# Patient Record
Sex: Male | Born: 1965 | Race: Black or African American | Hispanic: No | Marital: Single | Smoking: Never smoker
Health system: Southern US, Community
[De-identification: ages and names within clinical notes are randomized; demographics above are authoritative.]

## PROBLEM LIST (undated history)

## (undated) DIAGNOSIS — N189 Chronic kidney disease, unspecified: Secondary | ICD-10-CM

## (undated) DIAGNOSIS — N186 End stage renal disease: Secondary | ICD-10-CM

## (undated) DIAGNOSIS — G629 Polyneuropathy, unspecified: Secondary | ICD-10-CM

## (undated) DIAGNOSIS — K6289 Other specified diseases of anus and rectum: Secondary | ICD-10-CM

## (undated) DIAGNOSIS — N2581 Secondary hyperparathyroidism of renal origin: Secondary | ICD-10-CM

## (undated) DIAGNOSIS — K649 Unspecified hemorrhoids: Secondary | ICD-10-CM

## (undated) DIAGNOSIS — I5042 Chronic combined systolic (congestive) and diastolic (congestive) heart failure: Secondary | ICD-10-CM

## (undated) DIAGNOSIS — R768 Other specified abnormal immunological findings in serum: Secondary | ICD-10-CM

## (undated) DIAGNOSIS — N261 Atrophy of kidney (terminal): Secondary | ICD-10-CM

## (undated) DIAGNOSIS — D631 Anemia in chronic kidney disease: Secondary | ICD-10-CM

## (undated) DIAGNOSIS — Z992 Dependence on renal dialysis: Secondary | ICD-10-CM

## (undated) DIAGNOSIS — I444 Left anterior fascicular block: Secondary | ICD-10-CM

## (undated) DIAGNOSIS — Z8709 Personal history of other diseases of the respiratory system: Secondary | ICD-10-CM

## (undated) DIAGNOSIS — I10 Essential (primary) hypertension: Secondary | ICD-10-CM

## (undated) DIAGNOSIS — I255 Ischemic cardiomyopathy: Secondary | ICD-10-CM

## (undated) DIAGNOSIS — E1122 Type 2 diabetes mellitus with diabetic chronic kidney disease: Secondary | ICD-10-CM

## (undated) HISTORY — PX: TRANSTHORACIC ECHOCARDIOGRAM: SHX275

## (undated) HISTORY — PX: DOBUTAMINE STRESS ECHO: SHX5426

## (undated) HISTORY — DX: Essential (primary) hypertension: I10

## (undated) HISTORY — PX: CARDIOVASCULAR STRESS TEST: SHX262

---

## 2002-10-04 ENCOUNTER — Encounter: Payer: Self-pay | Admitting: Emergency Medicine

## 2002-10-04 ENCOUNTER — Emergency Department (HOSPITAL_COMMUNITY): Admission: EM | Admit: 2002-10-04 | Discharge: 2002-10-04 | Payer: Self-pay | Admitting: Emergency Medicine

## 2003-09-09 ENCOUNTER — Emergency Department (HOSPITAL_COMMUNITY): Admission: EM | Admit: 2003-09-09 | Discharge: 2003-09-09 | Payer: Self-pay | Admitting: Emergency Medicine

## 2004-09-12 ENCOUNTER — Inpatient Hospital Stay (HOSPITAL_COMMUNITY): Admission: EM | Admit: 2004-09-12 | Discharge: 2004-09-20 | Payer: Self-pay | Admitting: Emergency Medicine

## 2004-09-12 ENCOUNTER — Encounter (INDEPENDENT_AMBULATORY_CARE_PROVIDER_SITE_OTHER): Payer: Self-pay | Admitting: Specialist

## 2004-09-12 HISTORY — PX: APPENDECTOMY: SHX54

## 2005-07-22 ENCOUNTER — Encounter (INDEPENDENT_AMBULATORY_CARE_PROVIDER_SITE_OTHER): Payer: Self-pay | Admitting: *Deleted

## 2005-07-22 ENCOUNTER — Ambulatory Visit (HOSPITAL_COMMUNITY): Admission: RE | Admit: 2005-07-22 | Discharge: 2005-07-22 | Payer: Self-pay | Admitting: Pulmonary Disease

## 2005-09-25 ENCOUNTER — Emergency Department (HOSPITAL_COMMUNITY): Admission: EM | Admit: 2005-09-25 | Discharge: 2005-09-26 | Payer: Self-pay | Admitting: Emergency Medicine

## 2005-10-07 ENCOUNTER — Ambulatory Visit (HOSPITAL_COMMUNITY): Admission: RE | Admit: 2005-10-07 | Discharge: 2005-10-07 | Payer: Self-pay | Admitting: Pulmonary Disease

## 2005-12-15 ENCOUNTER — Ambulatory Visit (HOSPITAL_COMMUNITY): Admission: RE | Admit: 2005-12-15 | Discharge: 2005-12-15 | Payer: Self-pay | Admitting: Pulmonary Disease

## 2005-12-15 ENCOUNTER — Encounter: Payer: Self-pay | Admitting: Vascular Surgery

## 2006-04-23 ENCOUNTER — Emergency Department (HOSPITAL_COMMUNITY): Admission: EM | Admit: 2006-04-23 | Discharge: 2006-04-24 | Payer: Self-pay | Admitting: Emergency Medicine

## 2006-08-10 ENCOUNTER — Encounter (INDEPENDENT_AMBULATORY_CARE_PROVIDER_SITE_OTHER): Payer: Self-pay | Admitting: Pulmonary Disease

## 2006-08-10 ENCOUNTER — Ambulatory Visit: Payer: Self-pay | Admitting: Vascular Surgery

## 2006-08-10 ENCOUNTER — Ambulatory Visit (HOSPITAL_COMMUNITY): Admission: RE | Admit: 2006-08-10 | Discharge: 2006-08-10 | Payer: Self-pay | Admitting: Pulmonary Disease

## 2007-07-11 ENCOUNTER — Emergency Department (HOSPITAL_COMMUNITY): Admission: EM | Admit: 2007-07-11 | Discharge: 2007-07-11 | Payer: Self-pay | Admitting: Emergency Medicine

## 2007-07-16 ENCOUNTER — Emergency Department (HOSPITAL_COMMUNITY): Admission: EM | Admit: 2007-07-16 | Discharge: 2007-07-16 | Payer: Self-pay | Admitting: Emergency Medicine

## 2007-08-12 ENCOUNTER — Emergency Department (HOSPITAL_COMMUNITY): Admission: EM | Admit: 2007-08-12 | Discharge: 2007-08-12 | Payer: Self-pay | Admitting: Emergency Medicine

## 2009-03-22 HISTORY — PX: RETINAL DETACHMENT SURGERY: SHX105

## 2009-03-27 ENCOUNTER — Inpatient Hospital Stay (HOSPITAL_COMMUNITY): Admission: EM | Admit: 2009-03-27 | Discharge: 2009-03-29 | Payer: Self-pay | Admitting: Emergency Medicine

## 2009-03-29 ENCOUNTER — Encounter (INDEPENDENT_AMBULATORY_CARE_PROVIDER_SITE_OTHER): Payer: Self-pay | Admitting: Internal Medicine

## 2009-06-04 ENCOUNTER — Emergency Department (HOSPITAL_COMMUNITY): Admission: EM | Admit: 2009-06-04 | Discharge: 2009-06-04 | Payer: Self-pay | Admitting: Emergency Medicine

## 2009-07-09 ENCOUNTER — Encounter: Payer: Self-pay | Admitting: Internal Medicine

## 2009-08-19 ENCOUNTER — Ambulatory Visit: Payer: Self-pay | Admitting: Internal Medicine

## 2009-08-19 DIAGNOSIS — I2510 Atherosclerotic heart disease of native coronary artery without angina pectoris: Secondary | ICD-10-CM | POA: Insufficient documentation

## 2009-08-19 DIAGNOSIS — I5022 Chronic systolic (congestive) heart failure: Secondary | ICD-10-CM | POA: Insufficient documentation

## 2009-08-19 DIAGNOSIS — F32A Depression, unspecified: Secondary | ICD-10-CM | POA: Insufficient documentation

## 2009-08-19 DIAGNOSIS — N186 End stage renal disease: Secondary | ICD-10-CM | POA: Insufficient documentation

## 2009-08-19 DIAGNOSIS — I10 Essential (primary) hypertension: Secondary | ICD-10-CM | POA: Insufficient documentation

## 2009-08-19 DIAGNOSIS — F329 Major depressive disorder, single episode, unspecified: Secondary | ICD-10-CM

## 2009-08-19 DIAGNOSIS — E7800 Pure hypercholesterolemia, unspecified: Secondary | ICD-10-CM | POA: Insufficient documentation

## 2009-08-19 DIAGNOSIS — Z992 Dependence on renal dialysis: Secondary | ICD-10-CM | POA: Insufficient documentation

## 2009-10-06 ENCOUNTER — Ambulatory Visit: Payer: Self-pay | Admitting: Vascular Surgery

## 2009-10-06 ENCOUNTER — Ambulatory Visit: Admission: RE | Admit: 2009-10-06 | Discharge: 2009-10-06 | Payer: Self-pay | Admitting: Emergency Medicine

## 2009-10-06 ENCOUNTER — Encounter (INDEPENDENT_AMBULATORY_CARE_PROVIDER_SITE_OTHER): Payer: Self-pay | Admitting: Emergency Medicine

## 2009-10-17 ENCOUNTER — Ambulatory Visit: Payer: Self-pay | Admitting: Vascular Surgery

## 2010-02-10 ENCOUNTER — Ambulatory Visit: Payer: Self-pay | Admitting: Vascular Surgery

## 2010-02-27 ENCOUNTER — Ambulatory Visit (HOSPITAL_COMMUNITY)
Admission: RE | Admit: 2010-02-27 | Discharge: 2010-02-27 | Payer: Self-pay | Source: Home / Self Care | Attending: Vascular Surgery | Admitting: Vascular Surgery

## 2010-02-27 HISTORY — PX: AV FISTULA PLACEMENT: SHX1204

## 2010-04-10 ENCOUNTER — Ambulatory Visit: Admission: RE | Admit: 2010-04-10 | Discharge: 2010-04-10 | Payer: Self-pay | Source: Home / Self Care

## 2010-04-10 NOTE — Assessment & Plan Note (Addendum)
OFFICE VISIT  Bachus, Estephan L DOB:  Dec 17, 1965                                       04/10/2010 [id number]  DATE OF SURGERY:  02/27/10.  Patient presents for routine followup status post right Cimino AV fistula placed on 02/27/2010 by Dr. Donnetta Hutching.  Patient is doing well and is without complaint.  He has not started dialysis yet.  He is followed by Dr. Jimmy Footman.  The incision is well healed.  There is a strong thrill in the fistula up to the mid forearm.  There are 2+ radial and ulnar pulses in the right wrist.  Motor and sensation are intact.  There is no evidence of steal.  Patient is doing well status post fistula placement.  He will follow up p.r.n.  He is aware that if he were to begin dialysis within the next several weeks that he would require a Diatek catheter placement until the fistula has had a complete 12 weeks to mature.  Leta [name], [place]  Conrad Salem, MD Electronically Signed  AY/MEDQ  D:  04/10/2010  T:  04/10/2010  Job:  [id number]

## 2010-04-23 NOTE — Assessment & Plan Note (Signed)
Summary: NEP/CARDIOMYOPATHY/JML   History of Present Illness: [name] [name] is referred today by Dr. Tamala Julian for consideration for prophylactic ICD implantation.  The patient has a h/o DCM diagnosed back in 2009.  He describes being diagnosed with pneumonia at that time and initially did not think that he had any CHF.  He had improvement in his LV function initially but then on repeat echo his LV function has dropped back to 30% and he is referred for additional evaluation.  The patient has been without syncope and his CHF is class 2.  He was recently started on BiDil and he thinks that his symptoms are even better.  He denies c/p or sob except with strenuous activities.  He has had trouble with peripheral edema.  No other complaints today.  He has known renal insufficiency with a baseline creatinine of 3.    Current Medications (verified): 1)  Amaryl 4 Mg Tabs (Glimepiride) 2)  Carvedilol 6.25 Mg Tabs (Carvedilol) .... Take One Tablet By Mouth Twice A Day 3)  Calcitriol 0.25 Mcg Caps (Calcitriol) .... Two Times A Day 4)  Lipitor 40 Mg Tabs (Atorvastatin Calcium) .... Take One Tablet By Mouth Daily. 5)  Aspirin 81 Mg Tbec (Aspirin) .... Take One Tablet By Mouth Daily 6)  Furosemide 80 Mg Tabs (Furosemide) .... Take 2 Tablets By Mouth Two Times A Day 7)  Bidil 20-37.5 Mg Tabs (Isosorb Dinitrate-Hydralazine) .... Take One Tablet By Mouth Twice Daily. 8)  Novolog 100 Unit/ml Soln (Insulin Aspart) .... Uad 9)  Multivitamins   Tabs (Multiple Vitamin) .... Once Daily 10)  Clonidine Hcl 0.1 Mg Tabs (Clonidine Hcl) .... Take One Tablet By Mouth Twice A Day  Allergies (verified): No Known Drug Allergies  Past History:  Past Medical History: Last updated: 08/19/2009 Current Problems:  DEPRESSION, HX OF (ICD-V11.8) HYPERCHOLESTEROLEMIA (ICD-272.0) HYPERTENSION (ICD-401.9) CHF (ICD-428.0) CAD (ICD-414.00) ATRIAL FLUTTER (ICD-427.32) ATRIAL FIBRILLATION (ICD-427.31)    Family History: No  information on parents. Brother has a stroke and HTN.  Social History: H/o tobacco none now Rare ETOH single  Review of Systems       All systems reviewed and negative except as noted in the HPI.  Vital Signs:  Patient profile:   45 year old male Height:      66 inches Weight:      280 pounds BMI:     45.36 Pulse rate:   91 / minute BP sitting:   142 / 88  (left arm)  Vitals Entered By: Margaretmary Bayley CMA (Aug 19, 2009 3:43 PM)  Physical Exam  General:  Obese, well developed, well nourished, in no acute distress.  HEENT: normal Neck: supple. No JVD. Carotids 2+ bilaterally no bruits Cor: RRR no rubs, gallops or murmur Lungs: CTA Ab: soft, nontender. nondistended. No HSM. Good bowel sounds Ext: warm. no cyanosis, clubbing or edema Neuro: alert and oriented. Grossly nonfocal. affect pleasant    EKG  Procedure date:  08/19/2009  Findings:      Normal sinus rhythm with rate of:  91.  Impression & Recommendations:  Problem # 1:  CHRONIC SYSTOLIC HEART FAILURE (123456) His symptoms are well controlled and class 2.  I have discussed the treatment options with the patient.  Though he has an indication for ICD, he is not inclined to pursue this at the present time.  He has been on BiDil for a couple of months and thinks that this may help with his symptoms and LV function.  He states that he would  like to repeat his echo in a few more months and would consider ICD if his LV function  remains depressed. His updated medication list for this problem includes:    Carvedilol 6.25 Mg Tabs (Carvedilol) .[name]... Take one tablet by mouth twice a day    Aspirin 81 Mg Tbec (Aspirin) .[name]... Take one tablet by mouth daily    Furosemide 80 Mg Tabs (Furosemide) .[name]... Take 2 tablets by mouth two times a day  Problem # 2:  HYPERTENSION (ICD-401.9) I have asked him to maintain a low sodium diet and continue his current meds. His updated medication list for this problem includes:     Carvedilol 6.25 Mg Tabs (Carvedilol) .[name]... Take one tablet by mouth twice a day    Aspirin 81 Mg Tbec (Aspirin) .[name]... Take one tablet by mouth daily    Furosemide 80 Mg Tabs (Furosemide) .[name]... Take 2 tablets by mouth two times a day    Clonidine Hcl 0.1 Mg Tabs (Clonidine hcl) .[name]... Take one tablet by mouth twice a day  Problem # 3:  RENAL INSUFFICIENCY, CHRONIC (ICD-585.9) His creatine is around 3.  The importance of taking his medications has been emphasized as is the maintenance of a low sodium diet.

## 2010-04-23 NOTE — Letter (Signed)
Summary: [name] Note  [facility] Office Note   Imported By: Sallee Provencal 08/25/2009 14:25:09  _____________________________________________________________________  External Attachment:    Type:   Image     Comment:   External Document

## 2010-06-02 LAB — POCT I-STAT 4, (NA,K, GLUC, HGB,HCT)
Glucose, Bld: 203 mg/dL — ABNORMAL HIGH (ref 70–99)
HCT: 34 % — ABNORMAL LOW (ref 39.0–52.0)
Hemoglobin: 11.6 g/dL — ABNORMAL LOW (ref 13.0–17.0)
Potassium: 3.2 mEq/L — ABNORMAL LOW (ref 3.5–5.1)
Sodium: 139 mEq/L (ref 135–145)

## 2010-06-02 LAB — SURGICAL PCR SCREEN
MRSA, PCR: NEGATIVE
Staphylococcus aureus: NEGATIVE

## 2010-06-02 LAB — GLUCOSE, CAPILLARY
Glucose-Capillary: 111 mg/dL — ABNORMAL HIGH (ref 70–99)
Glucose-Capillary: 126 mg/dL — ABNORMAL HIGH (ref 70–99)
Glucose-Capillary: 143 mg/dL — ABNORMAL HIGH (ref 70–99)
Glucose-Capillary: 146 mg/dL — ABNORMAL HIGH (ref 70–99)
Glucose-Capillary: 221 mg/dL — ABNORMAL HIGH (ref 70–99)

## 2010-06-07 LAB — BRAIN NATRIURETIC PEPTIDE: Pro B Natriuretic peptide (BNP): 236 pg/mL — ABNORMAL HIGH (ref 0.0–100.0)

## 2010-06-07 LAB — GLUCOSE, CAPILLARY
Glucose-Capillary: 119 mg/dL — ABNORMAL HIGH (ref 70–99)
Glucose-Capillary: 144 mg/dL — ABNORMAL HIGH (ref 70–99)
Glucose-Capillary: 163 mg/dL — ABNORMAL HIGH (ref 70–99)
Glucose-Capillary: 167 mg/dL — ABNORMAL HIGH (ref 70–99)
Glucose-Capillary: 168 mg/dL — ABNORMAL HIGH (ref 70–99)
Glucose-Capillary: 198 mg/dL — ABNORMAL HIGH (ref 70–99)
Glucose-Capillary: 202 mg/dL — ABNORMAL HIGH (ref 70–99)
Glucose-Capillary: 53 mg/dL — ABNORMAL LOW (ref 70–99)
Glucose-Capillary: 87 mg/dL (ref 70–99)
Glucose-Capillary: 89 mg/dL (ref 70–99)

## 2010-06-07 LAB — CBC
HCT: 33.1 % — ABNORMAL LOW (ref 39.0–52.0)
Hemoglobin: 11.1 g/dL — ABNORMAL LOW (ref 13.0–17.0)
MCHC: 33.6 g/dL (ref 30.0–36.0)
MCV: 95.1 fL (ref 78.0–100.0)
Platelets: 239 10*3/uL (ref 150–400)
RBC: 3.48 MIL/uL — ABNORMAL LOW (ref 4.22–5.81)
RDW: 14.3 % (ref 11.5–15.5)
WBC: 7.8 10*3/uL (ref 4.0–10.5)

## 2010-06-07 LAB — DIFFERENTIAL
Basophils Absolute: 0.1 10*3/uL (ref 0.0–0.1)
Basophils Relative: 1 % (ref 0–1)
Eosinophils Absolute: 0.2 10*3/uL (ref 0.0–0.7)
Eosinophils Relative: 3 % (ref 0–5)
Lymphocytes Relative: 23 % (ref 12–46)
Lymphs Abs: 1.8 10*3/uL (ref 0.7–4.0)
Monocytes Absolute: 0.4 10*3/uL (ref 0.1–1.0)
Monocytes Relative: 5 % (ref 3–12)
Neutro Abs: 5.3 10*3/uL (ref 1.7–7.7)
Neutrophils Relative %: 68 % (ref 43–77)

## 2010-06-07 LAB — BASIC METABOLIC PANEL
BUN: 36 mg/dL — ABNORMAL HIGH (ref 6–23)
BUN: 38 mg/dL — ABNORMAL HIGH (ref 6–23)
BUN: 45 mg/dL — ABNORMAL HIGH (ref 6–23)
CO2: 22 mEq/L (ref 19–32)
CO2: 22 mEq/L (ref 19–32)
CO2: 23 mEq/L (ref 19–32)
Calcium: 8.8 mg/dL (ref 8.4–10.5)
Calcium: 8.9 mg/dL (ref 8.4–10.5)
Calcium: 9.2 mg/dL (ref 8.4–10.5)
Chloride: 105 mEq/L (ref 96–112)
Chloride: 108 mEq/L (ref 96–112)
Chloride: 113 mEq/L — ABNORMAL HIGH (ref 96–112)
Creatinine, Ser: 3.2 mg/dL — ABNORMAL HIGH (ref 0.4–1.5)
Creatinine, Ser: 3.36 mg/dL — ABNORMAL HIGH (ref 0.4–1.5)
Creatinine, Ser: 3.6 mg/dL — ABNORMAL HIGH (ref 0.4–1.5)
GFR calc Af Amer: 23 mL/min — ABNORMAL LOW (ref >60)
GFR calc Af Amer: 24 mL/min — ABNORMAL LOW (ref >60)
GFR calc Af Amer: 26 mL/min — ABNORMAL LOW (ref >60)
GFR calc non Af Amer: 19 mL/min — ABNORMAL LOW (ref >60)
GFR calc non Af Amer: 20 mL/min — ABNORMAL LOW (ref >60)
GFR calc non Af Amer: 21 mL/min — ABNORMAL LOW (ref >60)
Glucose, Bld: 155 mg/dL — ABNORMAL HIGH (ref 70–99)
Glucose, Bld: 160 mg/dL — ABNORMAL HIGH (ref 70–99)
Glucose, Bld: 180 mg/dL — ABNORMAL HIGH (ref 70–99)
Potassium: 4.1 mEq/L (ref 3.5–5.1)
Potassium: 4.3 mEq/L (ref 3.5–5.1)
Potassium: 5 mEq/L (ref 3.5–5.1)
Sodium: 137 mEq/L (ref 135–145)
Sodium: 138 mEq/L (ref 135–145)
Sodium: 141 mEq/L (ref 135–145)

## 2010-06-07 LAB — PHOSPHORUS: Phosphorus: 4 mg/dL (ref 2.3–4.6)

## 2010-06-07 LAB — CARDIAC PANEL(CRET KIN+CKTOT+MB+TROPI)
CK, MB: 4.4 ng/mL — ABNORMAL HIGH (ref 0.3–4.0)
Relative Index: 2 (ref 0.0–2.5)
Total CK: 217 U/L (ref 7–232)
Troponin I: 0.03 ng/mL (ref 0.00–0.06)

## 2010-06-07 LAB — PROTIME-INR
INR: 0.96 (ref 0.00–1.49)
Prothrombin Time: 12.7 seconds (ref 11.6–15.2)

## 2010-06-07 LAB — POCT CARDIAC MARKERS
CKMB, poc: 4.7 ng/mL (ref 1.0–8.0)
Myoglobin, poc: 384 ng/mL (ref 12–200)
Troponin i, poc: <0.05 ng/mL (ref 0.00–0.09)

## 2010-06-07 LAB — CALCIUM: Calcium: 8.8 mg/dL (ref 8.4–10.5)

## 2010-06-07 LAB — HEMOGLOBIN A1C
Hgb A1c MFr Bld: 9.5 % — ABNORMAL HIGH (ref 4.6–6.1)
Mean Plasma Glucose: 226 mg/dL

## 2010-06-07 LAB — MAGNESIUM: Magnesium: 2.1 mg/dL (ref 1.5–2.5)

## 2010-06-07 LAB — PTH, INTACT AND CALCIUM
Calcium, Total (PTH): 8.3 mg/dL — ABNORMAL LOW (ref 8.4–10.5)
PTH: 146.3 pg/mL — ABNORMAL HIGH (ref 14.0–72.0)

## 2010-06-07 LAB — SEDIMENTATION RATE: Sed Rate: 95 mm/hr — ABNORMAL HIGH (ref 0–16)

## 2010-06-14 LAB — CBC
HCT: 38.2 % — ABNORMAL LOW (ref 39.0–52.0)
Hemoglobin: 12.6 g/dL — ABNORMAL LOW (ref 13.0–17.0)
MCHC: 33.2 g/dL (ref 30.0–36.0)
MCV: 97.4 fL (ref 78.0–100.0)
Platelets: 241 10*3/uL (ref 150–400)
RBC: 3.92 MIL/uL — ABNORMAL LOW (ref 4.22–5.81)
RDW: 15.6 % — ABNORMAL HIGH (ref 11.5–15.5)
WBC: 10.4 10*3/uL (ref 4.0–10.5)

## 2010-06-14 LAB — BASIC METABOLIC PANEL
BUN: 35 mg/dL — ABNORMAL HIGH (ref 6–23)
CO2: 28 mEq/L (ref 19–32)
Calcium: 9.1 mg/dL (ref 8.4–10.5)
Chloride: 108 mEq/L (ref 96–112)
Creatinine, Ser: 4.06 mg/dL — ABNORMAL HIGH (ref 0.4–1.5)
GFR calc Af Amer: 20 mL/min — ABNORMAL LOW (ref >60)
GFR calc non Af Amer: 16 mL/min — ABNORMAL LOW (ref >60)
Glucose, Bld: 151 mg/dL — ABNORMAL HIGH (ref 70–99)
Potassium: 4 mEq/L (ref 3.5–5.1)
Sodium: 141 mEq/L (ref 135–145)

## 2010-06-14 LAB — DIFFERENTIAL
Basophils Absolute: 0.1 10*3/uL (ref 0.0–0.1)
Basophils Relative: 1 % (ref 0–1)
Eosinophils Absolute: 0.3 10*3/uL (ref 0.0–0.7)
Eosinophils Relative: 3 % (ref 0–5)
Lymphocytes Relative: 20 % (ref 12–46)
Lymphs Abs: 2.1 10*3/uL (ref 0.7–4.0)
Monocytes Absolute: 0.7 10*3/uL (ref 0.1–1.0)
Monocytes Relative: 7 % (ref 3–12)
Neutro Abs: 7.2 10*3/uL (ref 1.7–7.7)
Neutrophils Relative %: 70 % (ref 43–77)

## 2010-08-04 NOTE — Assessment & Plan Note (Signed)
OFFICE VISIT   [name], Gay L  DOB:  1966/03/12                                       02/10/2010  [id number]   [name] [name] presents today for continued discussion regarding AV  access.  I saw him initially in July 2011.  He has had a slow  progression of his renal insufficiency and is here today for continued  discussion regarding AV access.  I did review his prior venous mapping  and this showed inadequate cephalic vein on the left and a good basilic  vein on the left.  He does have a patent cephalic vein throughout its  course on the right.  He is right-handed.  I again discussed options  with [name] [name].  On physical exam, he does have 2+ radial pulses  bilaterally and does have a nice cephalic vein at the wrist on the  right.  I had discussed basilic vein transposition on the left before  but would recommend a right Cimino AV fistula since he does have an  acceptable vein and a nice caliber on physical exam.  He understands and  wishes to proceed on February 27, 2010 around his work schedule.  He  understands this is an outpatient procedure, understands that he may  have poor maturation or occlusion of his fistula and in all likelihood  will require ongoing access maintenance in the future.     Rosetta Posner, M.D.  Electronically Signed   TFE/MEDQ  D:  02/10/2010  T:  02/11/2010  Job:  [id number]

## 2010-08-04 NOTE — Procedures (Signed)
CEPHALIC VEIN MAPPING   INDICATION:  Evaluation for AV fistula.   HISTORY:  Stage 4 renal disease.   EXAM:   The right cephalic vein is compressible.  The right basilic vein is  compressible.   Diameter measurements range from 0.48 to A999333 in the cephalic vein and  [id number] to [id number] in the basilic vein   The left cephalic vein has intraluminal thickening and thrombus noted in  the cephalic vein in the mid lower arm.  The left basilic vein is  compressible.   Diameter measurements range from 0.37 to 123XX123 in the cephalic vein and  123XX123 to 123456 in the basilic vein.   See attached worksheet for all measurements.   IMPRESSION:  1. Patent right cephalic vein and right and left basilic veins with      diameters as noted above.  2. Left cephalic vein has evidence of intraluminal thickening and      thrombus noted in the cephalic vein in the mid lower arm.   ___________________________________________  Rosetta Posner, M.D.   NT/MEDQ  D:  10/17/2009  T:  10/17/2009  Job:  [id number]

## 2010-08-04 NOTE — Consult Note (Signed)
NEW PATIENT CONSULTATION   [name], [name] L  DOB:  1965/09/24                                       10/17/2009  [id number]   The patient presents today for discussion of AV access.  He is a 45-year-  old gentleman with progressive renal insufficiency.  I have his office  notes as provided by Dr. Jimmy Footman.  His creatinine is in the mid 5  range and it is felt that he will need hemodialysis access relatively  soon.  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  His only  prior surgery was appendectomy.  He is single.  He has no children.  He  does not smoke or drink alcohol.   REVIEW OF SYSTEMS:  Positive for weight gain up to 278 pounds.  He is 5  feet 6 inches tall.  He does have shortness of breath with lying flat.   PHYSICAL EXAMINATION:  A well-developed, well-nourished obese black male  in no acute distress.  Blood pressure is 106/68, pulse 76, respirations  18.  He has normal radial pulses bilaterally.  He has very small surface  veins bilaterally.   He underwent vein mapping in our office which showed inadequate cephalic  vein in the left arm.  He does have a moderate sized cephalic vein in  the right arm.  He has a very nice caliber basilic vein in the left arm.   I had a long discussion with the patient explaining the options for  hemodialysis to include a catheter for acute hemodialysis, AV fistula  and AV grafts.  He clearly is not a candidate for a left arm AV fistula  but would be a candidate for a basilic vein transposition.  I explained  the magnitude of this procedure and also the chance that it would not  mature adequately for long-term hemodialysis.  He understands this.  He  is not willing to proceed with scheduling of this and wants to discuss  this later more fully with Dr. Jimmy Footman before he is willing to commit  to access placement.  We are available at his  convenience for surgery.     Rosetta Posner, M.D.  Electronically Signed   TFE/MEDQ  D:  10/17/2009  T:  10/17/2009  Job:  [id number]   cc:   Jeneen Rinks L. [place], M.D.

## 2010-08-07 NOTE — H&P (Signed)
[name] [name], [name] [name]                ACCOUNT NO.:  [id number]   MEDICAL RECORD NO.:  [id number]          PATIENT TYPE:  OBV   LOCATION:  0104                         FACILITY[facility]  [facility]   PHYSICIAN:  Orson Ape. Weatherly, M.D.DATE OF BIRTH:  Mar 05, 1966   DATE OF ADMISSION:  09/12/2004  DATE OF DISCHARGE:                                HISTORY & PHYSICAL   CHIEF COMPLAINT:  Abdominal pain, 2-3 days, severe today.   HISTORY:  [name] [name] is a 45 year old male, diabetic, about 5 feet 5  inches, 240 pounds, who is followed by Dr. [name] for diabetes  and he is on three oral medications for good control.  He stated about 2-3  days ago he started having kind of abdominal discomfort, it kind of waxed  and waned.  He thought it was the flu.  He thought he was getting better,  then today he became very acutely ill with marked tenderness in the lower  right abdomen and presented to the emergency room at approximately 11:30  a.m.  His sugar was measured and it was about 400.  His white count was  15,200 with a hemoglobin of 14.8.  Electrolytes were okay, BUN was 6,  creatinine 1.2.  The ER physician sent him over for a CAT scan after oral  contrast and this showed a ruptured retrocecal appendicitis.  The patient  was started on Unasyn and I think the Unasyn was not started until after the  CT was performed.  They had given him some glucose; however, his sugar was  still about 400.  An IV had been started.  His temperature at this time was  102.9, and his pulse about 140.  He had received about 2 liters of IV fluids  and 3 grams of Unasyn.  We started him on a Glucomander.  I saw him and he  was definitely tender in the right lower quadrant, more so than other  quadrants of the abdomen, and he definitely needs a urgent laparotomy for  removal of his ruptured retrocecal appendix with a generalized peritonitis.  The patient, I do not think, has had any previous abdominal surgery.  He  works  at [employer].  I am sure exactly what the three medications  Glucophage and two other medications.  I do not know exactly but he was not  on insulin. His mother says yes his sugar was well managed prior to the  onset of this problem.  The patient has no history of high blood pressure or  angina.   No allergies.   PHYSICAL EXAMINATION:  VITAL SIGNS:  When I saw him and this was about 5:30  or so, his pulse was 140, blood pressure 120/71, his temperature was about  102, and respirations were 20.  EYES/EARS/NOSE/THROAT:  He appears adequately hydrated.  He is oriented.  LUNGS:  Good breath sounds bilaterally.  HEART:  Sinus tachycardia.  ABDOMEN:  He is definitely tender with rebound in the right lower quadrant.  He is mildly tender in the other quadrants but much more in the right lower  quadrant.  RECTAL:  I did not do a rectal exam, since the CT had been performed and  obviously shows appendicitis.  EXTREMITIES:  He has got good peripheral pulses.  Warm to touch.   ADMISSION IMPRESSION:  1.  Ruptured retrocecal appendicitis.  2.  History of diabetes, now significantly out of control from the      infection.   PLAN:  1.  The patient has been started on a Glucomander.  2.  We have given him Unasyn.  3.  We will take him over to the operating room with __________  and fluid      boluses and he will have a Foley catheter, etcetera.       WJW/MEDQ  D:  09/12/2004  T:  09/12/2004  Job:  [id number]   cc:   Orson Ape. Rise Patience, M.D.  [address], [address]  [place]  [place] [address]   Leola Brazil., M.D.  [place]. [place]  [name] [address]  Fax: [phone]   patient's chart

## 2010-08-07 NOTE — Op Note (Signed)
NAMELYNDLE, VIRGEN                ACCOUNT NO.:  [id number]   MEDICAL RECORD NO.:  [id number]          PATIENT TYPE:  OBV   LOCATION:  0104                         FACILITY:  [facility]   PHYSICIAN:  Orson Ape. Weatherly, M.D.DATE OF BIRTH:  08/28/1965   DATE OF PROCEDURE:  09/12/2004  DATE OF DISCHARGE:                                 OPERATIVE REPORT   PREOPERATIVE DIAGNOSES:  1.  Ruptured retrocecal appendicitis with peritonitis.  2.  Diabetes with glucose about 400.   POSTOPERATIVE DIAGNOSES:  1.  Ruptured retrocecal appendicitis with peritonitis.  2.  Diabetes with glucose about 400.   OPERATION:  Laparotomy and removal of a ruptured retrocecal appendicitis and  drainage.  Wound left open.   SURGEON:  Orson Ape. Rise Patience, M.D.   ASSISTANTS:  Two scrub techs.   ANESTHESIA:  General.   HISTORY:  Kohlson Steffensmeier is a 45 year old about 5 feet 4-or-5 inch male, about  240 pounds who is an oral diabetic; and has had about a 3-day history of  progressive abdominal pain that would kind of wax and wane.  Today it got  much worse and he presented to the emergency room at about 11:30.  His  glucose was 400.  He has been nauseous, but not really vomiting, and fluids  were administered.  A CT showed a retrocecal appendix and then he was given  3 grams of Indocin.  His pulse is elevated as was his temperature and I  recommended that we go ahead and start him on pretty high insulin and  proceed on with the a laparotomy.   He was taken to surgery.  Fortunately there were 2 scrub nurses, Eddie Dibbles who is  quite strong and available, and I made a right transverse, right lower  quadrant incision that included approximately 3-to-4 inches of adipose  tissue.  The anterior rectus fascia was divided transversely.  A portion of  the right rectus was divided and then kind of carefully opened into the  peritoneal cavity.  He had marked, foul smelling, fecal drainage that was  cultured aerobically and  anaerobically and you could feel this markedly  inflamed appendix which was kind of teased up.  The surrounding adipose  tissue was very friable.  Hemostasis was obtained with predominately ties of  2-0 Vicryl.  I did use some sutures of 2-0 Vicryl.   The appendix was gangrenous and had ruptured close to its base and I was  able to kind of mobilize the cecum.  You could not get this up to the  abdominal wall and I used a TA-30 with the regular staples to undergo this  inflamed appendix and then removed the appendix.  The suture line was then  inverted with interrupted 3-0 silk sutures and good hemostasis had been  obtained.  A 19-Blake drain was placed through the operative area down into  the pelvis after thoroughly irrigating everything.  There was really no  omentum that I could bring down over this and then the peritoneum and  transversalis area was all closed with a running #0 Vicryl and then the  anterior rectus fascia and some of the little more lateral areas of the  internal oblique were closed with interrupted sutures of #0 Prolene. The  subcutaneous tissue was packed with Betadine and saline soaked gauzes and  the does have an NG-tube.  He had a Foley catheter inserted preoperatively  and we will keep him on a Foley catheter.  The patient tolerated the  procedure  nicely.  His glucose was about 350 at the completion.  His pulse now is  about 120.  He is making adequate urine and we will get him on the  Glucometer and then also broader antibiotic coverage of Unasyn and a  __________ Flagyl.  There was just foul, fecal smelling drainage upon  opening the abdomen.       WJW/MEDQ  D:  09/12/2004  T:  09/13/2004  Job:  [id number]   cc:   Orson Ape. Rise Patience, M.D.  [address], [address]  [place]  [place] [address]   Leola Brazil., M.D.  [place] [place]  [place] [address]  Fax: [phone]

## 2010-08-07 NOTE — Discharge Summary (Signed)
NAMEAHKING, PRETZER                ACCOUNT NO.:  [id number]   MEDICAL RECORD NO.:  [id number]          PATIENT TYPE:  INP   LOCATION:  [place]                         FACILITY:  [facility]   PHYSICIAN:  Orson Ape. Weatherly, M.D.DATE OF BIRTH:  1966/01/13   DATE OF ADMISSION:  09/12/2004  DATE OF DISCHARGE:  09/20/2004                                 DISCHARGE SUMMARY   DISCHARGE DIAGNOSES:  1.  Ruptured retrocecal appendicitis.  2.  Diabetes mellitus.   CONSULTATIONS:  Dr. Katherine Roan.   HISTORY:  [name] [name] is a 45 year old male diabetic, 240 pounds, who is  followed by Dr. [name] for his diabetes and is on three oral  medications. He states for approximately 3 days he was having abdominal  discomfort that kind of waxed and waned, thought he was having the flu, then  thought he was getting better, but then presented to the emergency room at  approximately 11:30 on September 11, 2004. He had a white count of 15,000; BUN  was normal; and a CT was performed which showed a ruptured retrocecal  appendicitis. The patient was started on Unasyn but I think his Unasyn was  not started until the CT was completed, and his glucose was greater than  400. He had a temperature of about 103 and I was called and he was  definitely tender in the right lower quadrant and appeared to have a  generalized peritonitis. He has never been on insulin and was given a fluid  bolus, Tylenol for his elevated temperature, and then taken to surgery. He  had a markedly inflamed, ruptured retrocecal appendix and it was performed  through a right lower quadrant transverse incision and postoperatively was  managed with a Glucometer, and I added Flagyl to the antibiotics. He  continued to have an elevated temperature and the cultures showed  Pseudomonas in addition to the other enteric organisms, and the Unasyn was  changed to Zosyn. Dr. Katherine Roan was asked to see the patient since it  appears to me that oral  medications are not adequate for his diabetic  management, and he was started on Lantus insulin 25 units every night and  also glucose checks, and also was continued with the Glucophage 500 mg  b.i.d. and Lasix and K-Dur. The patient's wound had been left open and he  was discharged on Cipro 750 mg b.i.d. for 5 days and also Flagyl 500 mg  q.i.d. for 5 days, and Vicodin for pain. He will be seen in the office for a  wound check in approximately 4 or 5 days. Hopefully the wound can be Steri-  Stripped at that time. The patient's diet was up to a regular diabetic diet  at the time of his release, and he hopefully will be able to return to work  in approximately 2 weeks. He is discharged in improved condition and most  likely will need to continue on insulin as the oral medications were  probably not adequately controlling his diabetes.       WJW/MEDQ  D:  10/19/2004  T:  10/19/2004  Job:  [id number]  cc:   Leola Brazil., M.D.  [place]. [address] [place]  [place] [address]  Fax: [phone]

## 2010-10-09 ENCOUNTER — Ambulatory Visit: Payer: Self-pay

## 2010-10-14 ENCOUNTER — Ambulatory Visit (INDEPENDENT_AMBULATORY_CARE_PROVIDER_SITE_OTHER): Payer: 59

## 2010-10-14 DIAGNOSIS — T82598A Other mechanical complication of other cardiac and vascular devices and implants, initial encounter: Secondary | ICD-10-CM

## 2010-10-14 DIAGNOSIS — T82898A Other specified complication of vascular prosthetic devices, implants and grafts, initial encounter: Secondary | ICD-10-CM

## 2010-10-14 DIAGNOSIS — N186 End stage renal disease: Secondary | ICD-10-CM

## 2010-10-14 NOTE — H&P (Signed)
HISTORY AND PHYSICAL EXAMINATION  October 14, 2010  Re:  [place]                DOB:  24-Jun-1965  This is a patient who back on February 27, 2010 underwent a right wrist Cimino AV fistula.  He tolerated the procedure well and has done great. At that time he was not on dialysis, and this allowed time for maturation of his fistula.  He did begin dialysis on September 03, 2010.  The renal physicians wanted [place] to reevaluate his access due to continued problems with his access site.  PAST MEDICAL HISTORY/PAST SURGICAL HISTORY: 1. End-stage renal disease.     a.     Status post right wrist Cimino AV fistula on February 27, 2010. 2. Long history of insulin dependent diabetes. 3. Congestive heart failure. 4. History of appendectomy.  ALLERGIES:  No known drug allergies.  CURRENT MEDICATIONS:  The patient will bring a list of medications on admission.  SOCIAL HISTORY:  He is single, does not smoke, and has never smoked. Denies any EtOH.  FAMILY HISTORY:  His mother is living at age 43 with no medical problems.  His father's age is unknown; however, he is living but has diabetes.  REVIEW OF SYSTEMS:  GENERAL:  For any weight changes or fever.  Loss of appetite.  VASCULAR:  He denies any pain in his legs while walking.  He denies any symptoms of stroke such as slurred speech, temporary blindness, or amaurosis fugax.  CARDIAC:  He denies any chest pain or shortness of breath with exertion.  GI: He denies any peptic ulcer disease, melena, or GERD.  NEUROLOGIC:  He denies dizziness or blackouts.  PULMONARY:  He denies home oxygen, productive cough, or hemoptysis.  HEMATOLOGIC:  He denies any bleeding or clotting disorders. URINARY:  He does still makes some urine and denies dysuria or hematuria.  ENT:  He has no changes in his eyesight or hearing. MUSCULOSKELETAL:  Positive for joint pain in his knees.  PHYSICAL EXAM:  Vital signs: Blood pressure is 120/65, heart  rate 102. His O2 saturations are 99% on room air.  General: He is in no acute distress.  HEENT: Pupils are equal, round, reactive to light. Conjunctiva is normal.  Lungs are clear to auscultation bilaterally. Heart: Regular rate and rhythm.  No carotid bruits were heard.  Abdomen: Soft, nontender, nondistended with positive bowel sounds. Musculoskeletal: There are no major deformities.  Neuro: There are no focal defects.  Extremities: He has a well-healed radiocephalic fistula incision.  He has a 2+ radial pulse bilaterally.  He does have a thrill; however, this dissipates mid way up on his forearm.  His lower extremities are without edema.  ASSESSMENT:  This is a 45 year old African American male who is now on hemodialysis, and he dialyzes on Tuesday, Thursday, and Saturdays. Today we did obtain a duplex of his fistula, which reveals multiple branches.  Dr. Scot Dock did come in and examined the patient and reviewed his duplex scan and recommends a fistulogram, which he will perform Monday, August 6.  Evorn Gong, PA  Judeth Cornfield. Scot Dock, M.D. Electronically Signed  SE/MEDQ  D:  10/14/2010  T:  10/14/2010  Job:  [id number]

## 2010-10-21 NOTE — Procedures (Unsigned)
VASCULAR LAB EXAM  INDICATION:  Poor thrill in right radiocephalic AV fistula.  HISTORY: Diabetes: Cardiac: Hypertension:  EXAM:  Right radiocephalic AV fistula duplex.  IMPRESSION: 1. Patent right radiocephalic arteriovenous fistula with no     significant increase in velocities visualized. 2. The right radial artery flow at the wrist level is triphasic and     antegrade. 3. Doppler waveforms of the right brachium level outflow vein appeared     dampened and nonphasic. 4. Depth, diameter, velocity and outflow vein branch measurements are     noted on the attached worksheet.  ___________________________________________ Judeth Cornfield. Scot Dock, M.D.  CH/MEDQ  D:  10/16/2010  T:  10/16/2010  Job:  [id number]

## 2010-10-26 ENCOUNTER — Other Ambulatory Visit (HOSPITAL_COMMUNITY): Payer: 59

## 2010-10-26 ENCOUNTER — Ambulatory Visit (HOSPITAL_COMMUNITY)
Admission: RE | Admit: 2010-10-26 | Discharge: 2010-10-26 | Disposition: A | Discharge status: Home / Self Care | Payer: 59 | Source: Ambulatory Visit | Attending: Vascular Surgery | Admitting: Vascular Surgery

## 2010-10-26 DIAGNOSIS — T82898A Other specified complication of vascular prosthetic devices, implants and grafts, initial encounter: Secondary | ICD-10-CM

## 2010-10-26 DIAGNOSIS — Z01810 Encounter for preprocedural cardiovascular examination: Secondary | ICD-10-CM | POA: Insufficient documentation

## 2010-10-26 DIAGNOSIS — I120 Hypertensive chronic kidney disease with stage 5 chronic kidney disease or end stage renal disease: Secondary | ICD-10-CM

## 2010-10-26 DIAGNOSIS — N186 End stage renal disease: Secondary | ICD-10-CM

## 2010-10-26 DIAGNOSIS — Y832 Surgical operation with anastomosis, bypass or graft as the cause of abnormal reaction of the patient, or of later complication, without mention of misadventure at the time of the procedure: Secondary | ICD-10-CM | POA: Insufficient documentation

## 2010-10-26 LAB — POCT I-STAT, CHEM 8
BUN: 34 mg/dL — ABNORMAL HIGH (ref 6–23)
Calcium, Ion: 1.1 mmol/L — ABNORMAL LOW (ref 1.12–1.32)
Chloride: 104 mEq/L (ref 96–112)
Creatinine, Ser: 7.6 mg/dL — ABNORMAL HIGH (ref 0.50–1.35)
Glucose, Bld: 154 mg/dL — ABNORMAL HIGH (ref 70–99)
HCT: 33 % — ABNORMAL LOW (ref 39.0–52.0)
Hemoglobin: 11.2 g/dL — ABNORMAL LOW (ref 13.0–17.0)
Potassium: 4.3 mEq/L (ref 3.5–5.1)
Sodium: 139 mEq/L (ref 135–145)
TCO2: 26 mmol/L (ref 0–100)

## 2010-10-26 LAB — GLUCOSE, CAPILLARY: Glucose-Capillary: 144 mg/dL — ABNORMAL HIGH (ref 70–99)

## 2010-10-28 NOTE — Op Note (Signed)
  [name] [name], [name] [name]                ACCOUNT NO.:  [id number]  MEDICAL RECORD NO.:  [id number]  LOCATION:  SDSC                         FACILITY:  [facility]  PHYSICIAN:  Judeth Cornfield. Scot Dock, M.D.DATE OF BIRTH:  [birth year]  DATE OF PROCEDURE: DATE OF DISCHARGE:                              OPERATIVE REPORT   PREOPERATIVE DIAGNOSIS:  Poorly functioning right arteriovenous fistula.  POSTOPERATIVE DIAGNOSIS:  Poorly functioning right arteriovenous fistula.  PROCEDURE: 1. Ultrasound-guided access to the right AV fistula. 2. Fistulogram.  SURGEON:  Judeth Cornfield. Scot Dock, MD.  ANESTHESIA:  Local.  TECHNIQUE:  The patient was taken to the PV lab and the right arm was prepped and draped in usual sterile fashion.  After the skin was infiltrated with 1% lidocaine and under ultrasound guidance, the right AV fistula was cannulated and a guidewire introduced.  The micropuncture sheath was then introduced and secured with a OpSite.  Next, multiple injections were made to visualize the entire fistula up into the chest. Next additional films were obtained by compressing the fistula to try to visualize the arterial anastomosis.  FINDINGS:  There is 1 significant competing branch in the proximal fistula which fills the deep veins.  At the antecubital level, the upper arm cephalic vein is smaller and the dominant runoff is via the basilic vein.  Also the proximal upper arm cephalic vein is bifid over a short distance.  The arterial anastomosis could not be visualized despite multiple attempts to demonstrate this and I suspect there is significant disease within the proximal fistula.  The patient is to be scheduled for ligation of the competing branch and revision of the proximal fistula likely with an interposition segment of vein.  CONCLUSIONS: 1. Significant competing branch in the proximal fistula. 2. Proximal disease in the AV fistula which was difficult to      visualize.    Judeth Cornfield. Scot Dock, M.D.    CSD/MEDQ  D:  10/26/2010  T:  10/26/2010  Job:  [id number]  Electronically Signed by Deitra Mayo M.D. on 10/28/2010 09:31:49 AM

## 2010-10-30 ENCOUNTER — Ambulatory Visit (HOSPITAL_COMMUNITY)
Admission: RE | Admit: 2010-10-30 | Discharge: 2010-10-30 | Disposition: A | Discharge status: Home / Self Care | Payer: 59 | Source: Ambulatory Visit | Attending: Vascular Surgery | Admitting: Vascular Surgery

## 2010-10-30 DIAGNOSIS — I120 Hypertensive chronic kidney disease with stage 5 chronic kidney disease or end stage renal disease: Secondary | ICD-10-CM

## 2010-10-30 DIAGNOSIS — T82898A Other specified complication of vascular prosthetic devices, implants and grafts, initial encounter: Secondary | ICD-10-CM

## 2010-10-30 DIAGNOSIS — E119 Type 2 diabetes mellitus without complications: Secondary | ICD-10-CM | POA: Insufficient documentation

## 2010-10-30 DIAGNOSIS — Z01812 Encounter for preprocedural laboratory examination: Secondary | ICD-10-CM | POA: Insufficient documentation

## 2010-10-30 DIAGNOSIS — N186 End stage renal disease: Secondary | ICD-10-CM

## 2010-10-30 DIAGNOSIS — Y832 Surgical operation with anastomosis, bypass or graft as the cause of abnormal reaction of the patient, or of later complication, without mention of misadventure at the time of the procedure: Secondary | ICD-10-CM | POA: Insufficient documentation

## 2010-10-30 DIAGNOSIS — E669 Obesity, unspecified: Secondary | ICD-10-CM | POA: Insufficient documentation

## 2010-10-30 HISTORY — PX: AV FISTULA REPAIR: SHX563

## 2010-10-30 LAB — GLUCOSE, CAPILLARY
Glucose-Capillary: 153 mg/dL — ABNORMAL HIGH (ref 70–99)
Glucose-Capillary: 142 mg/dL — ABNORMAL HIGH (ref 70–99)
Glucose-Capillary: 133 mg/dL — ABNORMAL HIGH (ref 70–99)

## 2010-10-30 LAB — SURGICAL PCR SCREEN
MRSA, PCR: NEGATIVE
Staphylococcus aureus: POSITIVE — AB

## 2010-10-30 LAB — POCT I-STAT 4, (NA,K, GLUC, HGB,HCT)
Glucose, Bld: 181 mg/dL — ABNORMAL HIGH (ref 70–99)
HCT: 37 % — ABNORMAL LOW (ref 39.0–52.0)
Hemoglobin: 12.6 g/dL — ABNORMAL LOW (ref 13.0–17.0)
Potassium: 4.6 mEq/L (ref 3.5–5.1)
Sodium: 140 mEq/L (ref 135–145)

## 2010-11-04 NOTE — Op Note (Signed)
  NAMEPETERSON, BOGIE                ACCOUNT NO.:  [id number]  MEDICAL RECORD NO.:  [id number]  LOCATION:  SDSC                         FACILITY:  [facility]  PHYSICIAN:  Judeth Cornfield. Scot Dock, M.D.DATE OF BIRTH:  1965/06/18  DATE OF PROCEDURE: DATE OF DISCHARGE:  10/30/2010                              OPERATIVE REPORT   PREOPERATIVE DIAGNOSIS:  Chronic kidney disease with poorly functioning right radiocephalic arteriovenous fistula.  POSTOPERATIVE DIAGNOSIS:  Chronic kidney disease with poorly functioning right radiocephalic arteriovenous fistula.  PROCEDURE:  Ligation of competing branches of right radiocephalic AV fistula.  SURGEON:  Judeth Cornfield. Scot Dock, MD  ASSISTANT:  Nurse.  ANESTHESIA:  Local with sedation.  TECHNIQUE:  The patient was taken to the operating room and sedated by Anesthesia.  The right upper extremity was prepped and draped in usual sterile fashion.  Using the duplex scanner, I was able to identify the large competing branch which had been identified by fistulogram where this emptied into the deep system.  In addition, I identified another area with 2 branches.  This information was confirmed using a Doppler also.  After the skin was anesthetized, transverse incision was made over these two areas.  Both areas had 2 competing branches which were ligated with 3-0 silk ties.  The vein was scarred in these areas from multiple cannulations at these sites.  The vein was a reasonable size. Hemostasis was obtained.  The wound was closed with deep layer of 3-0 Vicryl, the skin closed with 4-0 Vicryl.  Sterile dressing was applied. The patient tolerated the procedure well, was transferred to recovery room in stable condition.  All needle and sponge counts were correct.     Judeth Cornfield. Scot Dock, M.D.     CSD/MEDQ  D:  10/30/2010  T:  10/31/2010  Job:  [id number]  Electronically Signed by Deitra Mayo M.D. on 11/04/2010 09:18:56 AM

## 2010-12-15 LAB — BASIC METABOLIC PANEL
BUN: 14
BUN: 20
CO2: 22
CO2: 26
Calcium: 8.9
Calcium: 9.1
Chloride: 105
Chloride: 106
Creatinine, Ser: 1.93 — ABNORMAL HIGH
Creatinine, Ser: 2.07 — ABNORMAL HIGH
GFR calc Af Amer: 43 — ABNORMAL LOW
GFR calc Af Amer: 47 — ABNORMAL LOW
GFR calc non Af Amer: 36 — ABNORMAL LOW
GFR calc non Af Amer: 39 — ABNORMAL LOW
Glucose, Bld: 204 — ABNORMAL HIGH
Glucose, Bld: 325 — ABNORMAL HIGH
Potassium: 4.3
Potassium: 4.4
Sodium: 136
Sodium: 139

## 2010-12-15 LAB — CBC
HCT: 36.6 — ABNORMAL LOW
HCT: 38 — ABNORMAL LOW
Hemoglobin: 12.3 — ABNORMAL LOW
Hemoglobin: 13.5
MCHC: 33.6
MCHC: 35.7
MCV: 90.2
MCV: 92
Platelets: 247
Platelets: 267
RBC: 3.98 — ABNORMAL LOW
RBC: 4.21 — ABNORMAL LOW
RDW: 13.8
RDW: 14.3
WBC: 13.9 — ABNORMAL HIGH
WBC: 7.2

## 2010-12-15 LAB — DIFFERENTIAL
Basophils Absolute: 0
Basophils Relative: 1
Eosinophils Absolute: 0.1
Eosinophils Relative: 2
Lymphocytes Relative: 29
Lymphs Abs: 2.1
Monocytes Absolute: 0.3
Monocytes Relative: 4
Neutro Abs: 4.7
Neutrophils Relative %: 65

## 2010-12-15 LAB — POTASSIUM: Potassium: 4

## 2010-12-16 LAB — CBC
HCT: 36.3 — ABNORMAL LOW
Hemoglobin: 12.7 — ABNORMAL LOW
MCHC: 35
MCV: 90.7
Platelets: 229
RBC: 4 — ABNORMAL LOW
RDW: 14.2
WBC: 7.2

## 2010-12-16 LAB — DIFFERENTIAL
Basophils Absolute: 0.1
Basophils Relative: 1
Eosinophils Absolute: 0.2
Eosinophils Relative: 3
Lymphocytes Relative: 33
Lymphs Abs: 2.4
Monocytes Absolute: 0.5
Monocytes Relative: 7
Neutro Abs: 4
Neutrophils Relative %: 56

## 2010-12-16 LAB — POCT I-STAT, CHEM 8
BUN: 22
Calcium, Ion: 1.19
Chloride: 108
Creatinine, Ser: 2.6 — ABNORMAL HIGH
Glucose, Bld: 156 — ABNORMAL HIGH
HCT: 36 — ABNORMAL LOW
Hemoglobin: 12.2 — ABNORMAL LOW
Potassium: 4.8
Sodium: 138
TCO2: 23

## 2010-12-16 LAB — POCT CARDIAC MARKERS
CKMB, poc: 3.5
Myoglobin, poc: 260
Operator id: 284141
Troponin i, poc: <0.05

## 2010-12-16 LAB — PROTIME-INR
INR: 1
Prothrombin Time: 13

## 2010-12-16 LAB — B-NATRIURETIC PEPTIDE (CONVERTED LAB): Pro B Natriuretic peptide (BNP): 452 — ABNORMAL HIGH

## 2010-12-16 LAB — D-DIMER, QUANTITATIVE: D-Dimer, Quant: 0.97 — ABNORMAL HIGH

## 2011-02-01 ENCOUNTER — Ambulatory Visit (INDEPENDENT_AMBULATORY_CARE_PROVIDER_SITE_OTHER): Payer: 59 | Admitting: Ophthalmology

## 2011-02-01 DIAGNOSIS — E1129 Type 2 diabetes mellitus with other diabetic kidney complication: Secondary | ICD-10-CM | POA: Insufficient documentation

## 2011-02-01 DIAGNOSIS — T82598A Other mechanical complication of other cardiac and vascular devices and implants, initial encounter: Secondary | ICD-10-CM | POA: Insufficient documentation

## 2011-03-01 ENCOUNTER — Ambulatory Visit (INDEPENDENT_AMBULATORY_CARE_PROVIDER_SITE_OTHER): Payer: 59 | Admitting: Ophthalmology

## 2011-03-01 DIAGNOSIS — E11359 Type 2 diabetes mellitus with proliferative diabetic retinopathy without macular edema: Secondary | ICD-10-CM

## 2011-03-01 DIAGNOSIS — H43819 Vitreous degeneration, unspecified eye: Secondary | ICD-10-CM

## 2011-03-01 DIAGNOSIS — E1139 Type 2 diabetes mellitus with other diabetic ophthalmic complication: Secondary | ICD-10-CM

## 2011-03-01 DIAGNOSIS — H2510 Age-related nuclear cataract, unspecified eye: Secondary | ICD-10-CM

## 2011-03-25 DIAGNOSIS — E119 Type 2 diabetes mellitus without complications: Secondary | ICD-10-CM | POA: Diagnosis not present

## 2011-04-29 DIAGNOSIS — E119 Type 2 diabetes mellitus without complications: Secondary | ICD-10-CM | POA: Diagnosis not present

## 2011-05-20 DIAGNOSIS — N186 End stage renal disease: Secondary | ICD-10-CM | POA: Diagnosis not present

## 2011-05-21 DIAGNOSIS — N186 End stage renal disease: Secondary | ICD-10-CM | POA: Diagnosis not present

## 2011-05-22 DIAGNOSIS — D509 Iron deficiency anemia, unspecified: Secondary | ICD-10-CM | POA: Diagnosis not present

## 2011-05-22 DIAGNOSIS — N2581 Secondary hyperparathyroidism of renal origin: Secondary | ICD-10-CM | POA: Diagnosis not present

## 2011-05-22 DIAGNOSIS — N186 End stage renal disease: Secondary | ICD-10-CM | POA: Diagnosis not present

## 2011-05-22 DIAGNOSIS — Z23 Encounter for immunization: Secondary | ICD-10-CM | POA: Diagnosis not present

## 2011-05-22 DIAGNOSIS — N039 Chronic nephritic syndrome with unspecified morphologic changes: Secondary | ICD-10-CM | POA: Diagnosis not present

## 2011-05-22 DIAGNOSIS — D631 Anemia in chronic kidney disease: Secondary | ICD-10-CM | POA: Diagnosis not present

## 2011-05-25 DIAGNOSIS — D509 Iron deficiency anemia, unspecified: Secondary | ICD-10-CM | POA: Diagnosis not present

## 2011-05-25 DIAGNOSIS — N186 End stage renal disease: Secondary | ICD-10-CM | POA: Diagnosis not present

## 2011-05-25 DIAGNOSIS — N039 Chronic nephritic syndrome with unspecified morphologic changes: Secondary | ICD-10-CM | POA: Diagnosis not present

## 2011-05-25 DIAGNOSIS — Z23 Encounter for immunization: Secondary | ICD-10-CM | POA: Diagnosis not present

## 2011-05-25 DIAGNOSIS — N2581 Secondary hyperparathyroidism of renal origin: Secondary | ICD-10-CM | POA: Diagnosis not present

## 2011-05-25 DIAGNOSIS — D631 Anemia in chronic kidney disease: Secondary | ICD-10-CM | POA: Diagnosis not present

## 2011-05-27 DIAGNOSIS — N2581 Secondary hyperparathyroidism of renal origin: Secondary | ICD-10-CM | POA: Diagnosis not present

## 2011-05-27 DIAGNOSIS — N186 End stage renal disease: Secondary | ICD-10-CM | POA: Diagnosis not present

## 2011-05-27 DIAGNOSIS — E119 Type 2 diabetes mellitus without complications: Secondary | ICD-10-CM | POA: Diagnosis not present

## 2011-05-27 DIAGNOSIS — D509 Iron deficiency anemia, unspecified: Secondary | ICD-10-CM | POA: Diagnosis not present

## 2011-05-27 DIAGNOSIS — Z23 Encounter for immunization: Secondary | ICD-10-CM | POA: Diagnosis not present

## 2011-05-27 DIAGNOSIS — N039 Chronic nephritic syndrome with unspecified morphologic changes: Secondary | ICD-10-CM | POA: Diagnosis not present

## 2011-05-27 DIAGNOSIS — D631 Anemia in chronic kidney disease: Secondary | ICD-10-CM | POA: Diagnosis not present

## 2011-05-29 DIAGNOSIS — N039 Chronic nephritic syndrome with unspecified morphologic changes: Secondary | ICD-10-CM | POA: Diagnosis not present

## 2011-05-29 DIAGNOSIS — N2581 Secondary hyperparathyroidism of renal origin: Secondary | ICD-10-CM | POA: Diagnosis not present

## 2011-05-29 DIAGNOSIS — Z23 Encounter for immunization: Secondary | ICD-10-CM | POA: Diagnosis not present

## 2011-05-29 DIAGNOSIS — D509 Iron deficiency anemia, unspecified: Secondary | ICD-10-CM | POA: Diagnosis not present

## 2011-05-29 DIAGNOSIS — D631 Anemia in chronic kidney disease: Secondary | ICD-10-CM | POA: Diagnosis not present

## 2011-05-29 DIAGNOSIS — N186 End stage renal disease: Secondary | ICD-10-CM | POA: Diagnosis not present

## 2011-06-01 DIAGNOSIS — Z23 Encounter for immunization: Secondary | ICD-10-CM | POA: Diagnosis not present

## 2011-06-01 DIAGNOSIS — D509 Iron deficiency anemia, unspecified: Secondary | ICD-10-CM | POA: Diagnosis not present

## 2011-06-01 DIAGNOSIS — N186 End stage renal disease: Secondary | ICD-10-CM | POA: Diagnosis not present

## 2011-06-01 DIAGNOSIS — N039 Chronic nephritic syndrome with unspecified morphologic changes: Secondary | ICD-10-CM | POA: Diagnosis not present

## 2011-06-01 DIAGNOSIS — N2581 Secondary hyperparathyroidism of renal origin: Secondary | ICD-10-CM | POA: Diagnosis not present

## 2011-06-01 DIAGNOSIS — D631 Anemia in chronic kidney disease: Secondary | ICD-10-CM | POA: Diagnosis not present

## 2011-06-03 DIAGNOSIS — D631 Anemia in chronic kidney disease: Secondary | ICD-10-CM | POA: Diagnosis not present

## 2011-06-03 DIAGNOSIS — D509 Iron deficiency anemia, unspecified: Secondary | ICD-10-CM | POA: Diagnosis not present

## 2011-06-03 DIAGNOSIS — Z23 Encounter for immunization: Secondary | ICD-10-CM | POA: Diagnosis not present

## 2011-06-03 DIAGNOSIS — N186 End stage renal disease: Secondary | ICD-10-CM | POA: Diagnosis not present

## 2011-06-03 DIAGNOSIS — N039 Chronic nephritic syndrome with unspecified morphologic changes: Secondary | ICD-10-CM | POA: Diagnosis not present

## 2011-06-03 DIAGNOSIS — N2581 Secondary hyperparathyroidism of renal origin: Secondary | ICD-10-CM | POA: Diagnosis not present

## 2011-06-05 DIAGNOSIS — N039 Chronic nephritic syndrome with unspecified morphologic changes: Secondary | ICD-10-CM | POA: Diagnosis not present

## 2011-06-05 DIAGNOSIS — D631 Anemia in chronic kidney disease: Secondary | ICD-10-CM | POA: Diagnosis not present

## 2011-06-05 DIAGNOSIS — Z23 Encounter for immunization: Secondary | ICD-10-CM | POA: Diagnosis not present

## 2011-06-05 DIAGNOSIS — N186 End stage renal disease: Secondary | ICD-10-CM | POA: Diagnosis not present

## 2011-06-05 DIAGNOSIS — D509 Iron deficiency anemia, unspecified: Secondary | ICD-10-CM | POA: Diagnosis not present

## 2011-06-05 DIAGNOSIS — N2581 Secondary hyperparathyroidism of renal origin: Secondary | ICD-10-CM | POA: Diagnosis not present

## 2011-06-07 DIAGNOSIS — IMO0001 Reserved for inherently not codable concepts without codable children: Secondary | ICD-10-CM | POA: Diagnosis not present

## 2011-06-07 DIAGNOSIS — E7800 Pure hypercholesterolemia, unspecified: Secondary | ICD-10-CM | POA: Diagnosis not present

## 2011-06-07 DIAGNOSIS — E039 Hypothyroidism, unspecified: Secondary | ICD-10-CM | POA: Diagnosis not present

## 2011-06-07 DIAGNOSIS — I119 Hypertensive heart disease without heart failure: Secondary | ICD-10-CM | POA: Diagnosis not present

## 2011-06-08 DIAGNOSIS — N039 Chronic nephritic syndrome with unspecified morphologic changes: Secondary | ICD-10-CM | POA: Diagnosis not present

## 2011-06-08 DIAGNOSIS — D509 Iron deficiency anemia, unspecified: Secondary | ICD-10-CM | POA: Diagnosis not present

## 2011-06-08 DIAGNOSIS — Z23 Encounter for immunization: Secondary | ICD-10-CM | POA: Diagnosis not present

## 2011-06-08 DIAGNOSIS — D631 Anemia in chronic kidney disease: Secondary | ICD-10-CM | POA: Diagnosis not present

## 2011-06-08 DIAGNOSIS — N2581 Secondary hyperparathyroidism of renal origin: Secondary | ICD-10-CM | POA: Diagnosis not present

## 2011-06-08 DIAGNOSIS — N186 End stage renal disease: Secondary | ICD-10-CM | POA: Diagnosis not present

## 2011-06-10 DIAGNOSIS — D509 Iron deficiency anemia, unspecified: Secondary | ICD-10-CM | POA: Diagnosis not present

## 2011-06-10 DIAGNOSIS — N186 End stage renal disease: Secondary | ICD-10-CM | POA: Diagnosis not present

## 2011-06-10 DIAGNOSIS — Z23 Encounter for immunization: Secondary | ICD-10-CM | POA: Diagnosis not present

## 2011-06-10 DIAGNOSIS — N039 Chronic nephritic syndrome with unspecified morphologic changes: Secondary | ICD-10-CM | POA: Diagnosis not present

## 2011-06-10 DIAGNOSIS — D631 Anemia in chronic kidney disease: Secondary | ICD-10-CM | POA: Diagnosis not present

## 2011-06-10 DIAGNOSIS — N2581 Secondary hyperparathyroidism of renal origin: Secondary | ICD-10-CM | POA: Diagnosis not present

## 2011-06-12 DIAGNOSIS — D631 Anemia in chronic kidney disease: Secondary | ICD-10-CM | POA: Diagnosis not present

## 2011-06-12 DIAGNOSIS — N2581 Secondary hyperparathyroidism of renal origin: Secondary | ICD-10-CM | POA: Diagnosis not present

## 2011-06-12 DIAGNOSIS — D509 Iron deficiency anemia, unspecified: Secondary | ICD-10-CM | POA: Diagnosis not present

## 2011-06-12 DIAGNOSIS — N186 End stage renal disease: Secondary | ICD-10-CM | POA: Diagnosis not present

## 2011-06-12 DIAGNOSIS — Z23 Encounter for immunization: Secondary | ICD-10-CM | POA: Diagnosis not present

## 2011-06-12 DIAGNOSIS — N039 Chronic nephritic syndrome with unspecified morphologic changes: Secondary | ICD-10-CM | POA: Diagnosis not present

## 2011-06-15 DIAGNOSIS — D509 Iron deficiency anemia, unspecified: Secondary | ICD-10-CM | POA: Diagnosis not present

## 2011-06-15 DIAGNOSIS — N186 End stage renal disease: Secondary | ICD-10-CM | POA: Diagnosis not present

## 2011-06-15 DIAGNOSIS — N039 Chronic nephritic syndrome with unspecified morphologic changes: Secondary | ICD-10-CM | POA: Diagnosis not present

## 2011-06-15 DIAGNOSIS — Z23 Encounter for immunization: Secondary | ICD-10-CM | POA: Diagnosis not present

## 2011-06-15 DIAGNOSIS — D631 Anemia in chronic kidney disease: Secondary | ICD-10-CM | POA: Diagnosis not present

## 2011-06-15 DIAGNOSIS — N2581 Secondary hyperparathyroidism of renal origin: Secondary | ICD-10-CM | POA: Diagnosis not present

## 2011-06-17 DIAGNOSIS — D631 Anemia in chronic kidney disease: Secondary | ICD-10-CM | POA: Diagnosis not present

## 2011-06-17 DIAGNOSIS — N186 End stage renal disease: Secondary | ICD-10-CM | POA: Diagnosis not present

## 2011-06-17 DIAGNOSIS — N039 Chronic nephritic syndrome with unspecified morphologic changes: Secondary | ICD-10-CM | POA: Diagnosis not present

## 2011-06-17 DIAGNOSIS — D509 Iron deficiency anemia, unspecified: Secondary | ICD-10-CM | POA: Diagnosis not present

## 2011-06-17 DIAGNOSIS — N2581 Secondary hyperparathyroidism of renal origin: Secondary | ICD-10-CM | POA: Diagnosis not present

## 2011-06-17 DIAGNOSIS — Z23 Encounter for immunization: Secondary | ICD-10-CM | POA: Diagnosis not present

## 2011-06-19 DIAGNOSIS — Z23 Encounter for immunization: Secondary | ICD-10-CM | POA: Diagnosis not present

## 2011-06-19 DIAGNOSIS — D631 Anemia in chronic kidney disease: Secondary | ICD-10-CM | POA: Diagnosis not present

## 2011-06-19 DIAGNOSIS — N2581 Secondary hyperparathyroidism of renal origin: Secondary | ICD-10-CM | POA: Diagnosis not present

## 2011-06-19 DIAGNOSIS — N186 End stage renal disease: Secondary | ICD-10-CM | POA: Diagnosis not present

## 2011-06-19 DIAGNOSIS — N039 Chronic nephritic syndrome with unspecified morphologic changes: Secondary | ICD-10-CM | POA: Diagnosis not present

## 2011-06-19 DIAGNOSIS — D509 Iron deficiency anemia, unspecified: Secondary | ICD-10-CM | POA: Diagnosis not present

## 2011-06-20 DIAGNOSIS — N186 End stage renal disease: Secondary | ICD-10-CM | POA: Diagnosis not present

## 2011-06-21 DIAGNOSIS — N186 End stage renal disease: Secondary | ICD-10-CM | POA: Diagnosis not present

## 2011-06-22 DIAGNOSIS — N2581 Secondary hyperparathyroidism of renal origin: Secondary | ICD-10-CM | POA: Diagnosis not present

## 2011-06-22 DIAGNOSIS — Z23 Encounter for immunization: Secondary | ICD-10-CM | POA: Diagnosis not present

## 2011-06-22 DIAGNOSIS — D509 Iron deficiency anemia, unspecified: Secondary | ICD-10-CM | POA: Diagnosis not present

## 2011-06-22 DIAGNOSIS — D631 Anemia in chronic kidney disease: Secondary | ICD-10-CM | POA: Diagnosis not present

## 2011-06-22 DIAGNOSIS — N039 Chronic nephritic syndrome with unspecified morphologic changes: Secondary | ICD-10-CM | POA: Diagnosis not present

## 2011-06-22 DIAGNOSIS — N186 End stage renal disease: Secondary | ICD-10-CM | POA: Diagnosis not present

## 2011-06-24 DIAGNOSIS — Z23 Encounter for immunization: Secondary | ICD-10-CM | POA: Diagnosis not present

## 2011-06-24 DIAGNOSIS — D631 Anemia in chronic kidney disease: Secondary | ICD-10-CM | POA: Diagnosis not present

## 2011-06-24 DIAGNOSIS — M109 Gout, unspecified: Secondary | ICD-10-CM | POA: Diagnosis not present

## 2011-06-24 DIAGNOSIS — N039 Chronic nephritic syndrome with unspecified morphologic changes: Secondary | ICD-10-CM | POA: Diagnosis not present

## 2011-06-24 DIAGNOSIS — E119 Type 2 diabetes mellitus without complications: Secondary | ICD-10-CM | POA: Diagnosis not present

## 2011-06-24 DIAGNOSIS — N2581 Secondary hyperparathyroidism of renal origin: Secondary | ICD-10-CM | POA: Diagnosis not present

## 2011-06-24 DIAGNOSIS — N186 End stage renal disease: Secondary | ICD-10-CM | POA: Diagnosis not present

## 2011-06-24 DIAGNOSIS — D509 Iron deficiency anemia, unspecified: Secondary | ICD-10-CM | POA: Diagnosis not present

## 2011-06-26 DIAGNOSIS — D509 Iron deficiency anemia, unspecified: Secondary | ICD-10-CM | POA: Diagnosis not present

## 2011-06-26 DIAGNOSIS — N2581 Secondary hyperparathyroidism of renal origin: Secondary | ICD-10-CM | POA: Diagnosis not present

## 2011-06-26 DIAGNOSIS — Z23 Encounter for immunization: Secondary | ICD-10-CM | POA: Diagnosis not present

## 2011-06-26 DIAGNOSIS — N186 End stage renal disease: Secondary | ICD-10-CM | POA: Diagnosis not present

## 2011-06-26 DIAGNOSIS — D631 Anemia in chronic kidney disease: Secondary | ICD-10-CM | POA: Diagnosis not present

## 2011-06-26 DIAGNOSIS — N039 Chronic nephritic syndrome with unspecified morphologic changes: Secondary | ICD-10-CM | POA: Diagnosis not present

## 2011-06-29 DIAGNOSIS — D509 Iron deficiency anemia, unspecified: Secondary | ICD-10-CM | POA: Diagnosis not present

## 2011-06-29 DIAGNOSIS — N2581 Secondary hyperparathyroidism of renal origin: Secondary | ICD-10-CM | POA: Diagnosis not present

## 2011-06-29 DIAGNOSIS — N039 Chronic nephritic syndrome with unspecified morphologic changes: Secondary | ICD-10-CM | POA: Diagnosis not present

## 2011-06-29 DIAGNOSIS — Z23 Encounter for immunization: Secondary | ICD-10-CM | POA: Diagnosis not present

## 2011-06-29 DIAGNOSIS — N186 End stage renal disease: Secondary | ICD-10-CM | POA: Diagnosis not present

## 2011-06-29 DIAGNOSIS — D631 Anemia in chronic kidney disease: Secondary | ICD-10-CM | POA: Diagnosis not present

## 2011-07-01 DIAGNOSIS — N2581 Secondary hyperparathyroidism of renal origin: Secondary | ICD-10-CM | POA: Diagnosis not present

## 2011-07-01 DIAGNOSIS — D509 Iron deficiency anemia, unspecified: Secondary | ICD-10-CM | POA: Diagnosis not present

## 2011-07-01 DIAGNOSIS — D631 Anemia in chronic kidney disease: Secondary | ICD-10-CM | POA: Diagnosis not present

## 2011-07-01 DIAGNOSIS — N039 Chronic nephritic syndrome with unspecified morphologic changes: Secondary | ICD-10-CM | POA: Diagnosis not present

## 2011-07-01 DIAGNOSIS — Z23 Encounter for immunization: Secondary | ICD-10-CM | POA: Diagnosis not present

## 2011-07-01 DIAGNOSIS — N186 End stage renal disease: Secondary | ICD-10-CM | POA: Diagnosis not present

## 2011-07-03 DIAGNOSIS — D509 Iron deficiency anemia, unspecified: Secondary | ICD-10-CM | POA: Diagnosis not present

## 2011-07-03 DIAGNOSIS — N039 Chronic nephritic syndrome with unspecified morphologic changes: Secondary | ICD-10-CM | POA: Diagnosis not present

## 2011-07-03 DIAGNOSIS — N2581 Secondary hyperparathyroidism of renal origin: Secondary | ICD-10-CM | POA: Diagnosis not present

## 2011-07-03 DIAGNOSIS — Z23 Encounter for immunization: Secondary | ICD-10-CM | POA: Diagnosis not present

## 2011-07-03 DIAGNOSIS — N186 End stage renal disease: Secondary | ICD-10-CM | POA: Diagnosis not present

## 2011-07-03 DIAGNOSIS — D631 Anemia in chronic kidney disease: Secondary | ICD-10-CM | POA: Diagnosis not present

## 2011-07-06 DIAGNOSIS — D509 Iron deficiency anemia, unspecified: Secondary | ICD-10-CM | POA: Diagnosis not present

## 2011-07-06 DIAGNOSIS — N2581 Secondary hyperparathyroidism of renal origin: Secondary | ICD-10-CM | POA: Diagnosis not present

## 2011-07-06 DIAGNOSIS — N039 Chronic nephritic syndrome with unspecified morphologic changes: Secondary | ICD-10-CM | POA: Diagnosis not present

## 2011-07-06 DIAGNOSIS — Z23 Encounter for immunization: Secondary | ICD-10-CM | POA: Diagnosis not present

## 2011-07-06 DIAGNOSIS — D631 Anemia in chronic kidney disease: Secondary | ICD-10-CM | POA: Diagnosis not present

## 2011-07-06 DIAGNOSIS — N186 End stage renal disease: Secondary | ICD-10-CM | POA: Diagnosis not present

## 2011-07-08 DIAGNOSIS — N186 End stage renal disease: Secondary | ICD-10-CM | POA: Diagnosis not present

## 2011-07-08 DIAGNOSIS — D631 Anemia in chronic kidney disease: Secondary | ICD-10-CM | POA: Diagnosis not present

## 2011-07-08 DIAGNOSIS — N2581 Secondary hyperparathyroidism of renal origin: Secondary | ICD-10-CM | POA: Diagnosis not present

## 2011-07-08 DIAGNOSIS — D509 Iron deficiency anemia, unspecified: Secondary | ICD-10-CM | POA: Diagnosis not present

## 2011-07-08 DIAGNOSIS — Z23 Encounter for immunization: Secondary | ICD-10-CM | POA: Diagnosis not present

## 2011-07-08 DIAGNOSIS — N039 Chronic nephritic syndrome with unspecified morphologic changes: Secondary | ICD-10-CM | POA: Diagnosis not present

## 2011-07-10 DIAGNOSIS — D509 Iron deficiency anemia, unspecified: Secondary | ICD-10-CM | POA: Diagnosis not present

## 2011-07-10 DIAGNOSIS — D631 Anemia in chronic kidney disease: Secondary | ICD-10-CM | POA: Diagnosis not present

## 2011-07-10 DIAGNOSIS — N2581 Secondary hyperparathyroidism of renal origin: Secondary | ICD-10-CM | POA: Diagnosis not present

## 2011-07-10 DIAGNOSIS — N039 Chronic nephritic syndrome with unspecified morphologic changes: Secondary | ICD-10-CM | POA: Diagnosis not present

## 2011-07-10 DIAGNOSIS — Z23 Encounter for immunization: Secondary | ICD-10-CM | POA: Diagnosis not present

## 2011-07-10 DIAGNOSIS — N186 End stage renal disease: Secondary | ICD-10-CM | POA: Diagnosis not present

## 2011-07-13 DIAGNOSIS — N186 End stage renal disease: Secondary | ICD-10-CM | POA: Diagnosis not present

## 2011-07-13 DIAGNOSIS — D509 Iron deficiency anemia, unspecified: Secondary | ICD-10-CM | POA: Diagnosis not present

## 2011-07-13 DIAGNOSIS — Z23 Encounter for immunization: Secondary | ICD-10-CM | POA: Diagnosis not present

## 2011-07-13 DIAGNOSIS — D631 Anemia in chronic kidney disease: Secondary | ICD-10-CM | POA: Diagnosis not present

## 2011-07-13 DIAGNOSIS — N039 Chronic nephritic syndrome with unspecified morphologic changes: Secondary | ICD-10-CM | POA: Diagnosis not present

## 2011-07-13 DIAGNOSIS — N2581 Secondary hyperparathyroidism of renal origin: Secondary | ICD-10-CM | POA: Diagnosis not present

## 2011-07-15 DIAGNOSIS — N186 End stage renal disease: Secondary | ICD-10-CM | POA: Diagnosis not present

## 2011-07-15 DIAGNOSIS — N2581 Secondary hyperparathyroidism of renal origin: Secondary | ICD-10-CM | POA: Diagnosis not present

## 2011-07-15 DIAGNOSIS — Z23 Encounter for immunization: Secondary | ICD-10-CM | POA: Diagnosis not present

## 2011-07-15 DIAGNOSIS — N039 Chronic nephritic syndrome with unspecified morphologic changes: Secondary | ICD-10-CM | POA: Diagnosis not present

## 2011-07-15 DIAGNOSIS — D509 Iron deficiency anemia, unspecified: Secondary | ICD-10-CM | POA: Diagnosis not present

## 2011-07-15 DIAGNOSIS — D631 Anemia in chronic kidney disease: Secondary | ICD-10-CM | POA: Diagnosis not present

## 2011-07-17 DIAGNOSIS — N186 End stage renal disease: Secondary | ICD-10-CM | POA: Diagnosis not present

## 2011-07-17 DIAGNOSIS — D509 Iron deficiency anemia, unspecified: Secondary | ICD-10-CM | POA: Diagnosis not present

## 2011-07-17 DIAGNOSIS — Z23 Encounter for immunization: Secondary | ICD-10-CM | POA: Diagnosis not present

## 2011-07-17 DIAGNOSIS — N2581 Secondary hyperparathyroidism of renal origin: Secondary | ICD-10-CM | POA: Diagnosis not present

## 2011-07-17 DIAGNOSIS — D631 Anemia in chronic kidney disease: Secondary | ICD-10-CM | POA: Diagnosis not present

## 2011-07-17 DIAGNOSIS — N039 Chronic nephritic syndrome with unspecified morphologic changes: Secondary | ICD-10-CM | POA: Diagnosis not present

## 2011-07-20 DIAGNOSIS — Z23 Encounter for immunization: Secondary | ICD-10-CM | POA: Diagnosis not present

## 2011-07-20 DIAGNOSIS — D631 Anemia in chronic kidney disease: Secondary | ICD-10-CM | POA: Diagnosis not present

## 2011-07-20 DIAGNOSIS — N186 End stage renal disease: Secondary | ICD-10-CM | POA: Diagnosis not present

## 2011-07-20 DIAGNOSIS — N2581 Secondary hyperparathyroidism of renal origin: Secondary | ICD-10-CM | POA: Diagnosis not present

## 2011-07-20 DIAGNOSIS — D509 Iron deficiency anemia, unspecified: Secondary | ICD-10-CM | POA: Diagnosis not present

## 2011-07-20 DIAGNOSIS — N039 Chronic nephritic syndrome with unspecified morphologic changes: Secondary | ICD-10-CM | POA: Diagnosis not present

## 2011-07-21 DIAGNOSIS — N186 End stage renal disease: Secondary | ICD-10-CM | POA: Diagnosis not present

## 2011-07-22 DIAGNOSIS — E119 Type 2 diabetes mellitus without complications: Secondary | ICD-10-CM | POA: Diagnosis not present

## 2011-07-22 DIAGNOSIS — N039 Chronic nephritic syndrome with unspecified morphologic changes: Secondary | ICD-10-CM | POA: Diagnosis not present

## 2011-07-22 DIAGNOSIS — N186 End stage renal disease: Secondary | ICD-10-CM | POA: Diagnosis not present

## 2011-07-22 DIAGNOSIS — Z125 Encounter for screening for malignant neoplasm of prostate: Secondary | ICD-10-CM | POA: Diagnosis not present

## 2011-07-22 DIAGNOSIS — D631 Anemia in chronic kidney disease: Secondary | ICD-10-CM | POA: Diagnosis not present

## 2011-07-22 DIAGNOSIS — Z1159 Encounter for screening for other viral diseases: Secondary | ICD-10-CM | POA: Diagnosis not present

## 2011-07-22 DIAGNOSIS — N2581 Secondary hyperparathyroidism of renal origin: Secondary | ICD-10-CM | POA: Diagnosis not present

## 2011-07-24 DIAGNOSIS — D631 Anemia in chronic kidney disease: Secondary | ICD-10-CM | POA: Diagnosis not present

## 2011-07-24 DIAGNOSIS — N2581 Secondary hyperparathyroidism of renal origin: Secondary | ICD-10-CM | POA: Diagnosis not present

## 2011-07-24 DIAGNOSIS — N039 Chronic nephritic syndrome with unspecified morphologic changes: Secondary | ICD-10-CM | POA: Diagnosis not present

## 2011-07-24 DIAGNOSIS — N186 End stage renal disease: Secondary | ICD-10-CM | POA: Diagnosis not present

## 2011-07-27 DIAGNOSIS — N039 Chronic nephritic syndrome with unspecified morphologic changes: Secondary | ICD-10-CM | POA: Diagnosis not present

## 2011-07-27 DIAGNOSIS — N2581 Secondary hyperparathyroidism of renal origin: Secondary | ICD-10-CM | POA: Diagnosis not present

## 2011-07-27 DIAGNOSIS — N186 End stage renal disease: Secondary | ICD-10-CM | POA: Diagnosis not present

## 2011-07-27 DIAGNOSIS — D631 Anemia in chronic kidney disease: Secondary | ICD-10-CM | POA: Diagnosis not present

## 2011-07-29 DIAGNOSIS — D631 Anemia in chronic kidney disease: Secondary | ICD-10-CM | POA: Diagnosis not present

## 2011-07-29 DIAGNOSIS — N2581 Secondary hyperparathyroidism of renal origin: Secondary | ICD-10-CM | POA: Diagnosis not present

## 2011-07-29 DIAGNOSIS — N039 Chronic nephritic syndrome with unspecified morphologic changes: Secondary | ICD-10-CM | POA: Diagnosis not present

## 2011-07-29 DIAGNOSIS — N186 End stage renal disease: Secondary | ICD-10-CM | POA: Diagnosis not present

## 2011-07-31 DIAGNOSIS — N186 End stage renal disease: Secondary | ICD-10-CM | POA: Diagnosis not present

## 2011-07-31 DIAGNOSIS — N039 Chronic nephritic syndrome with unspecified morphologic changes: Secondary | ICD-10-CM | POA: Diagnosis not present

## 2011-07-31 DIAGNOSIS — D631 Anemia in chronic kidney disease: Secondary | ICD-10-CM | POA: Diagnosis not present

## 2011-07-31 DIAGNOSIS — N2581 Secondary hyperparathyroidism of renal origin: Secondary | ICD-10-CM | POA: Diagnosis not present

## 2011-08-03 DIAGNOSIS — D631 Anemia in chronic kidney disease: Secondary | ICD-10-CM | POA: Diagnosis not present

## 2011-08-03 DIAGNOSIS — N186 End stage renal disease: Secondary | ICD-10-CM | POA: Diagnosis not present

## 2011-08-03 DIAGNOSIS — N039 Chronic nephritic syndrome with unspecified morphologic changes: Secondary | ICD-10-CM | POA: Diagnosis not present

## 2011-08-03 DIAGNOSIS — N2581 Secondary hyperparathyroidism of renal origin: Secondary | ICD-10-CM | POA: Diagnosis not present

## 2011-08-05 DIAGNOSIS — N039 Chronic nephritic syndrome with unspecified morphologic changes: Secondary | ICD-10-CM | POA: Diagnosis not present

## 2011-08-05 DIAGNOSIS — N2581 Secondary hyperparathyroidism of renal origin: Secondary | ICD-10-CM | POA: Diagnosis not present

## 2011-08-05 DIAGNOSIS — D631 Anemia in chronic kidney disease: Secondary | ICD-10-CM | POA: Diagnosis not present

## 2011-08-05 DIAGNOSIS — N186 End stage renal disease: Secondary | ICD-10-CM | POA: Diagnosis not present

## 2011-08-07 DIAGNOSIS — N039 Chronic nephritic syndrome with unspecified morphologic changes: Secondary | ICD-10-CM | POA: Diagnosis not present

## 2011-08-07 DIAGNOSIS — D631 Anemia in chronic kidney disease: Secondary | ICD-10-CM | POA: Diagnosis not present

## 2011-08-07 DIAGNOSIS — N186 End stage renal disease: Secondary | ICD-10-CM | POA: Diagnosis not present

## 2011-08-07 DIAGNOSIS — N2581 Secondary hyperparathyroidism of renal origin: Secondary | ICD-10-CM | POA: Diagnosis not present

## 2011-08-10 DIAGNOSIS — D631 Anemia in chronic kidney disease: Secondary | ICD-10-CM | POA: Diagnosis not present

## 2011-08-10 DIAGNOSIS — N186 End stage renal disease: Secondary | ICD-10-CM | POA: Diagnosis not present

## 2011-08-10 DIAGNOSIS — N039 Chronic nephritic syndrome with unspecified morphologic changes: Secondary | ICD-10-CM | POA: Diagnosis not present

## 2011-08-10 DIAGNOSIS — N2581 Secondary hyperparathyroidism of renal origin: Secondary | ICD-10-CM | POA: Diagnosis not present

## 2011-08-12 DIAGNOSIS — N2581 Secondary hyperparathyroidism of renal origin: Secondary | ICD-10-CM | POA: Diagnosis not present

## 2011-08-12 DIAGNOSIS — N039 Chronic nephritic syndrome with unspecified morphologic changes: Secondary | ICD-10-CM | POA: Diagnosis not present

## 2011-08-12 DIAGNOSIS — N186 End stage renal disease: Secondary | ICD-10-CM | POA: Diagnosis not present

## 2011-08-12 DIAGNOSIS — D631 Anemia in chronic kidney disease: Secondary | ICD-10-CM | POA: Diagnosis not present

## 2011-08-14 DIAGNOSIS — D631 Anemia in chronic kidney disease: Secondary | ICD-10-CM | POA: Diagnosis not present

## 2011-08-14 DIAGNOSIS — N039 Chronic nephritic syndrome with unspecified morphologic changes: Secondary | ICD-10-CM | POA: Diagnosis not present

## 2011-08-14 DIAGNOSIS — N186 End stage renal disease: Secondary | ICD-10-CM | POA: Diagnosis not present

## 2011-08-14 DIAGNOSIS — N2581 Secondary hyperparathyroidism of renal origin: Secondary | ICD-10-CM | POA: Diagnosis not present

## 2011-08-17 DIAGNOSIS — N039 Chronic nephritic syndrome with unspecified morphologic changes: Secondary | ICD-10-CM | POA: Diagnosis not present

## 2011-08-17 DIAGNOSIS — D631 Anemia in chronic kidney disease: Secondary | ICD-10-CM | POA: Diagnosis not present

## 2011-08-17 DIAGNOSIS — N2581 Secondary hyperparathyroidism of renal origin: Secondary | ICD-10-CM | POA: Diagnosis not present

## 2011-08-17 DIAGNOSIS — N186 End stage renal disease: Secondary | ICD-10-CM | POA: Diagnosis not present

## 2011-08-19 DIAGNOSIS — N2581 Secondary hyperparathyroidism of renal origin: Secondary | ICD-10-CM | POA: Diagnosis not present

## 2011-08-19 DIAGNOSIS — D631 Anemia in chronic kidney disease: Secondary | ICD-10-CM | POA: Diagnosis not present

## 2011-08-19 DIAGNOSIS — N186 End stage renal disease: Secondary | ICD-10-CM | POA: Diagnosis not present

## 2011-08-19 DIAGNOSIS — N039 Chronic nephritic syndrome with unspecified morphologic changes: Secondary | ICD-10-CM | POA: Diagnosis not present

## 2011-08-21 DIAGNOSIS — D631 Anemia in chronic kidney disease: Secondary | ICD-10-CM | POA: Diagnosis not present

## 2011-08-21 DIAGNOSIS — D509 Iron deficiency anemia, unspecified: Secondary | ICD-10-CM | POA: Diagnosis not present

## 2011-08-21 DIAGNOSIS — N2581 Secondary hyperparathyroidism of renal origin: Secondary | ICD-10-CM | POA: Diagnosis not present

## 2011-08-21 DIAGNOSIS — N186 End stage renal disease: Secondary | ICD-10-CM | POA: Diagnosis not present

## 2011-08-21 DIAGNOSIS — N039 Chronic nephritic syndrome with unspecified morphologic changes: Secondary | ICD-10-CM | POA: Diagnosis not present

## 2011-08-24 DIAGNOSIS — N039 Chronic nephritic syndrome with unspecified morphologic changes: Secondary | ICD-10-CM | POA: Diagnosis not present

## 2011-08-24 DIAGNOSIS — D509 Iron deficiency anemia, unspecified: Secondary | ICD-10-CM | POA: Diagnosis not present

## 2011-08-24 DIAGNOSIS — N2581 Secondary hyperparathyroidism of renal origin: Secondary | ICD-10-CM | POA: Diagnosis not present

## 2011-08-24 DIAGNOSIS — D631 Anemia in chronic kidney disease: Secondary | ICD-10-CM | POA: Diagnosis not present

## 2011-08-24 DIAGNOSIS — N186 End stage renal disease: Secondary | ICD-10-CM | POA: Diagnosis not present

## 2011-08-26 DIAGNOSIS — N039 Chronic nephritic syndrome with unspecified morphologic changes: Secondary | ICD-10-CM | POA: Diagnosis not present

## 2011-08-26 DIAGNOSIS — D631 Anemia in chronic kidney disease: Secondary | ICD-10-CM | POA: Diagnosis not present

## 2011-08-26 DIAGNOSIS — D509 Iron deficiency anemia, unspecified: Secondary | ICD-10-CM | POA: Diagnosis not present

## 2011-08-26 DIAGNOSIS — N186 End stage renal disease: Secondary | ICD-10-CM | POA: Diagnosis not present

## 2011-08-26 DIAGNOSIS — E119 Type 2 diabetes mellitus without complications: Secondary | ICD-10-CM | POA: Diagnosis not present

## 2011-08-26 DIAGNOSIS — N2581 Secondary hyperparathyroidism of renal origin: Secondary | ICD-10-CM | POA: Diagnosis not present

## 2011-08-28 DIAGNOSIS — N039 Chronic nephritic syndrome with unspecified morphologic changes: Secondary | ICD-10-CM | POA: Diagnosis not present

## 2011-08-28 DIAGNOSIS — N2581 Secondary hyperparathyroidism of renal origin: Secondary | ICD-10-CM | POA: Diagnosis not present

## 2011-08-28 DIAGNOSIS — D509 Iron deficiency anemia, unspecified: Secondary | ICD-10-CM | POA: Diagnosis not present

## 2011-08-28 DIAGNOSIS — N186 End stage renal disease: Secondary | ICD-10-CM | POA: Diagnosis not present

## 2011-08-28 DIAGNOSIS — D631 Anemia in chronic kidney disease: Secondary | ICD-10-CM | POA: Diagnosis not present

## 2011-08-30 ENCOUNTER — Ambulatory Visit (INDEPENDENT_AMBULATORY_CARE_PROVIDER_SITE_OTHER): Payer: 59 | Admitting: Ophthalmology

## 2011-08-30 DIAGNOSIS — M109 Gout, unspecified: Secondary | ICD-10-CM | POA: Diagnosis not present

## 2011-08-30 DIAGNOSIS — E7800 Pure hypercholesterolemia, unspecified: Secondary | ICD-10-CM | POA: Diagnosis not present

## 2011-08-30 DIAGNOSIS — E213 Hyperparathyroidism, unspecified: Secondary | ICD-10-CM | POA: Diagnosis not present

## 2011-08-30 DIAGNOSIS — I119 Hypertensive heart disease without heart failure: Secondary | ICD-10-CM | POA: Diagnosis not present

## 2011-08-30 DIAGNOSIS — D509 Iron deficiency anemia, unspecified: Secondary | ICD-10-CM | POA: Diagnosis not present

## 2011-08-30 DIAGNOSIS — D638 Anemia in other chronic diseases classified elsewhere: Secondary | ICD-10-CM | POA: Diagnosis not present

## 2011-08-30 DIAGNOSIS — IMO0001 Reserved for inherently not codable concepts without codable children: Secondary | ICD-10-CM | POA: Diagnosis not present

## 2011-08-31 DIAGNOSIS — N186 End stage renal disease: Secondary | ICD-10-CM | POA: Diagnosis not present

## 2011-08-31 DIAGNOSIS — D509 Iron deficiency anemia, unspecified: Secondary | ICD-10-CM | POA: Diagnosis not present

## 2011-08-31 DIAGNOSIS — N2581 Secondary hyperparathyroidism of renal origin: Secondary | ICD-10-CM | POA: Diagnosis not present

## 2011-08-31 DIAGNOSIS — N039 Chronic nephritic syndrome with unspecified morphologic changes: Secondary | ICD-10-CM | POA: Diagnosis not present

## 2011-08-31 DIAGNOSIS — D631 Anemia in chronic kidney disease: Secondary | ICD-10-CM | POA: Diagnosis not present

## 2011-09-02 DIAGNOSIS — N039 Chronic nephritic syndrome with unspecified morphologic changes: Secondary | ICD-10-CM | POA: Diagnosis not present

## 2011-09-02 DIAGNOSIS — D509 Iron deficiency anemia, unspecified: Secondary | ICD-10-CM | POA: Diagnosis not present

## 2011-09-02 DIAGNOSIS — N2581 Secondary hyperparathyroidism of renal origin: Secondary | ICD-10-CM | POA: Diagnosis not present

## 2011-09-02 DIAGNOSIS — D631 Anemia in chronic kidney disease: Secondary | ICD-10-CM | POA: Diagnosis not present

## 2011-09-02 DIAGNOSIS — N186 End stage renal disease: Secondary | ICD-10-CM | POA: Diagnosis not present

## 2011-09-04 DIAGNOSIS — N2581 Secondary hyperparathyroidism of renal origin: Secondary | ICD-10-CM | POA: Diagnosis not present

## 2011-09-04 DIAGNOSIS — D509 Iron deficiency anemia, unspecified: Secondary | ICD-10-CM | POA: Diagnosis not present

## 2011-09-04 DIAGNOSIS — N039 Chronic nephritic syndrome with unspecified morphologic changes: Secondary | ICD-10-CM | POA: Diagnosis not present

## 2011-09-04 DIAGNOSIS — N186 End stage renal disease: Secondary | ICD-10-CM | POA: Diagnosis not present

## 2011-09-04 DIAGNOSIS — D631 Anemia in chronic kidney disease: Secondary | ICD-10-CM | POA: Diagnosis not present

## 2011-09-07 DIAGNOSIS — N2581 Secondary hyperparathyroidism of renal origin: Secondary | ICD-10-CM | POA: Diagnosis not present

## 2011-09-07 DIAGNOSIS — D631 Anemia in chronic kidney disease: Secondary | ICD-10-CM | POA: Diagnosis not present

## 2011-09-07 DIAGNOSIS — N039 Chronic nephritic syndrome with unspecified morphologic changes: Secondary | ICD-10-CM | POA: Diagnosis not present

## 2011-09-07 DIAGNOSIS — D509 Iron deficiency anemia, unspecified: Secondary | ICD-10-CM | POA: Diagnosis not present

## 2011-09-07 DIAGNOSIS — N186 End stage renal disease: Secondary | ICD-10-CM | POA: Diagnosis not present

## 2011-09-09 DIAGNOSIS — N039 Chronic nephritic syndrome with unspecified morphologic changes: Secondary | ICD-10-CM | POA: Diagnosis not present

## 2011-09-09 DIAGNOSIS — N2581 Secondary hyperparathyroidism of renal origin: Secondary | ICD-10-CM | POA: Diagnosis not present

## 2011-09-09 DIAGNOSIS — D509 Iron deficiency anemia, unspecified: Secondary | ICD-10-CM | POA: Diagnosis not present

## 2011-09-09 DIAGNOSIS — D631 Anemia in chronic kidney disease: Secondary | ICD-10-CM | POA: Diagnosis not present

## 2011-09-09 DIAGNOSIS — N186 End stage renal disease: Secondary | ICD-10-CM | POA: Diagnosis not present

## 2011-09-11 DIAGNOSIS — N186 End stage renal disease: Secondary | ICD-10-CM | POA: Diagnosis not present

## 2011-09-11 DIAGNOSIS — D509 Iron deficiency anemia, unspecified: Secondary | ICD-10-CM | POA: Diagnosis not present

## 2011-09-11 DIAGNOSIS — N039 Chronic nephritic syndrome with unspecified morphologic changes: Secondary | ICD-10-CM | POA: Diagnosis not present

## 2011-09-11 DIAGNOSIS — D631 Anemia in chronic kidney disease: Secondary | ICD-10-CM | POA: Diagnosis not present

## 2011-09-11 DIAGNOSIS — N2581 Secondary hyperparathyroidism of renal origin: Secondary | ICD-10-CM | POA: Diagnosis not present

## 2011-09-14 DIAGNOSIS — N2581 Secondary hyperparathyroidism of renal origin: Secondary | ICD-10-CM | POA: Diagnosis not present

## 2011-09-14 DIAGNOSIS — N039 Chronic nephritic syndrome with unspecified morphologic changes: Secondary | ICD-10-CM | POA: Diagnosis not present

## 2011-09-14 DIAGNOSIS — D631 Anemia in chronic kidney disease: Secondary | ICD-10-CM | POA: Diagnosis not present

## 2011-09-14 DIAGNOSIS — D509 Iron deficiency anemia, unspecified: Secondary | ICD-10-CM | POA: Diagnosis not present

## 2011-09-14 DIAGNOSIS — N186 End stage renal disease: Secondary | ICD-10-CM | POA: Diagnosis not present

## 2011-09-16 DIAGNOSIS — N2581 Secondary hyperparathyroidism of renal origin: Secondary | ICD-10-CM | POA: Diagnosis not present

## 2011-09-16 DIAGNOSIS — D509 Iron deficiency anemia, unspecified: Secondary | ICD-10-CM | POA: Diagnosis not present

## 2011-09-16 DIAGNOSIS — D631 Anemia in chronic kidney disease: Secondary | ICD-10-CM | POA: Diagnosis not present

## 2011-09-16 DIAGNOSIS — N039 Chronic nephritic syndrome with unspecified morphologic changes: Secondary | ICD-10-CM | POA: Diagnosis not present

## 2011-09-16 DIAGNOSIS — N186 End stage renal disease: Secondary | ICD-10-CM | POA: Diagnosis not present

## 2011-09-18 DIAGNOSIS — D509 Iron deficiency anemia, unspecified: Secondary | ICD-10-CM | POA: Diagnosis not present

## 2011-09-18 DIAGNOSIS — N039 Chronic nephritic syndrome with unspecified morphologic changes: Secondary | ICD-10-CM | POA: Diagnosis not present

## 2011-09-18 DIAGNOSIS — D631 Anemia in chronic kidney disease: Secondary | ICD-10-CM | POA: Diagnosis not present

## 2011-09-18 DIAGNOSIS — N2581 Secondary hyperparathyroidism of renal origin: Secondary | ICD-10-CM | POA: Diagnosis not present

## 2011-09-18 DIAGNOSIS — N186 End stage renal disease: Secondary | ICD-10-CM | POA: Diagnosis not present

## 2011-09-20 DIAGNOSIS — N186 End stage renal disease: Secondary | ICD-10-CM | POA: Diagnosis not present

## 2011-09-21 DIAGNOSIS — D631 Anemia in chronic kidney disease: Secondary | ICD-10-CM | POA: Diagnosis not present

## 2011-09-21 DIAGNOSIS — N186 End stage renal disease: Secondary | ICD-10-CM | POA: Diagnosis not present

## 2011-09-21 DIAGNOSIS — N039 Chronic nephritic syndrome with unspecified morphologic changes: Secondary | ICD-10-CM | POA: Diagnosis not present

## 2011-09-21 DIAGNOSIS — N2581 Secondary hyperparathyroidism of renal origin: Secondary | ICD-10-CM | POA: Diagnosis not present

## 2011-09-23 DIAGNOSIS — M109 Gout, unspecified: Secondary | ICD-10-CM | POA: Diagnosis not present

## 2011-09-23 DIAGNOSIS — E119 Type 2 diabetes mellitus without complications: Secondary | ICD-10-CM | POA: Diagnosis not present

## 2011-09-23 DIAGNOSIS — D631 Anemia in chronic kidney disease: Secondary | ICD-10-CM | POA: Diagnosis not present

## 2011-09-23 DIAGNOSIS — N2581 Secondary hyperparathyroidism of renal origin: Secondary | ICD-10-CM | POA: Diagnosis not present

## 2011-09-23 DIAGNOSIS — N039 Chronic nephritic syndrome with unspecified morphologic changes: Secondary | ICD-10-CM | POA: Diagnosis not present

## 2011-09-23 DIAGNOSIS — N186 End stage renal disease: Secondary | ICD-10-CM | POA: Diagnosis not present

## 2011-09-25 DIAGNOSIS — N186 End stage renal disease: Secondary | ICD-10-CM | POA: Diagnosis not present

## 2011-09-25 DIAGNOSIS — D631 Anemia in chronic kidney disease: Secondary | ICD-10-CM | POA: Diagnosis not present

## 2011-09-25 DIAGNOSIS — N039 Chronic nephritic syndrome with unspecified morphologic changes: Secondary | ICD-10-CM | POA: Diagnosis not present

## 2011-09-25 DIAGNOSIS — N2581 Secondary hyperparathyroidism of renal origin: Secondary | ICD-10-CM | POA: Diagnosis not present

## 2011-09-28 DIAGNOSIS — N039 Chronic nephritic syndrome with unspecified morphologic changes: Secondary | ICD-10-CM | POA: Diagnosis not present

## 2011-09-28 DIAGNOSIS — D631 Anemia in chronic kidney disease: Secondary | ICD-10-CM | POA: Diagnosis not present

## 2011-09-28 DIAGNOSIS — N2581 Secondary hyperparathyroidism of renal origin: Secondary | ICD-10-CM | POA: Diagnosis not present

## 2011-09-28 DIAGNOSIS — N186 End stage renal disease: Secondary | ICD-10-CM | POA: Diagnosis not present

## 2011-09-30 DIAGNOSIS — N2581 Secondary hyperparathyroidism of renal origin: Secondary | ICD-10-CM | POA: Diagnosis not present

## 2011-09-30 DIAGNOSIS — N186 End stage renal disease: Secondary | ICD-10-CM | POA: Diagnosis not present

## 2011-09-30 DIAGNOSIS — D631 Anemia in chronic kidney disease: Secondary | ICD-10-CM | POA: Diagnosis not present

## 2011-09-30 DIAGNOSIS — N039 Chronic nephritic syndrome with unspecified morphologic changes: Secondary | ICD-10-CM | POA: Diagnosis not present

## 2011-10-02 DIAGNOSIS — N2581 Secondary hyperparathyroidism of renal origin: Secondary | ICD-10-CM | POA: Diagnosis not present

## 2011-10-02 DIAGNOSIS — N039 Chronic nephritic syndrome with unspecified morphologic changes: Secondary | ICD-10-CM | POA: Diagnosis not present

## 2011-10-02 DIAGNOSIS — D631 Anemia in chronic kidney disease: Secondary | ICD-10-CM | POA: Diagnosis not present

## 2011-10-02 DIAGNOSIS — N186 End stage renal disease: Secondary | ICD-10-CM | POA: Diagnosis not present

## 2011-10-05 DIAGNOSIS — D631 Anemia in chronic kidney disease: Secondary | ICD-10-CM | POA: Diagnosis not present

## 2011-10-05 DIAGNOSIS — N186 End stage renal disease: Secondary | ICD-10-CM | POA: Diagnosis not present

## 2011-10-05 DIAGNOSIS — N039 Chronic nephritic syndrome with unspecified morphologic changes: Secondary | ICD-10-CM | POA: Diagnosis not present

## 2011-10-05 DIAGNOSIS — N2581 Secondary hyperparathyroidism of renal origin: Secondary | ICD-10-CM | POA: Diagnosis not present

## 2011-10-07 DIAGNOSIS — N039 Chronic nephritic syndrome with unspecified morphologic changes: Secondary | ICD-10-CM | POA: Diagnosis not present

## 2011-10-07 DIAGNOSIS — D631 Anemia in chronic kidney disease: Secondary | ICD-10-CM | POA: Diagnosis not present

## 2011-10-07 DIAGNOSIS — N186 End stage renal disease: Secondary | ICD-10-CM | POA: Diagnosis not present

## 2011-10-07 DIAGNOSIS — N2581 Secondary hyperparathyroidism of renal origin: Secondary | ICD-10-CM | POA: Diagnosis not present

## 2011-10-09 DIAGNOSIS — N186 End stage renal disease: Secondary | ICD-10-CM | POA: Diagnosis not present

## 2011-10-09 DIAGNOSIS — N2581 Secondary hyperparathyroidism of renal origin: Secondary | ICD-10-CM | POA: Diagnosis not present

## 2011-10-09 DIAGNOSIS — D631 Anemia in chronic kidney disease: Secondary | ICD-10-CM | POA: Diagnosis not present

## 2011-10-09 DIAGNOSIS — N039 Chronic nephritic syndrome with unspecified morphologic changes: Secondary | ICD-10-CM | POA: Diagnosis not present

## 2011-10-12 DIAGNOSIS — N039 Chronic nephritic syndrome with unspecified morphologic changes: Secondary | ICD-10-CM | POA: Diagnosis not present

## 2011-10-12 DIAGNOSIS — N2581 Secondary hyperparathyroidism of renal origin: Secondary | ICD-10-CM | POA: Diagnosis not present

## 2011-10-12 DIAGNOSIS — D631 Anemia in chronic kidney disease: Secondary | ICD-10-CM | POA: Diagnosis not present

## 2011-10-12 DIAGNOSIS — N186 End stage renal disease: Secondary | ICD-10-CM | POA: Diagnosis not present

## 2011-10-14 DIAGNOSIS — D631 Anemia in chronic kidney disease: Secondary | ICD-10-CM | POA: Diagnosis not present

## 2011-10-14 DIAGNOSIS — N039 Chronic nephritic syndrome with unspecified morphologic changes: Secondary | ICD-10-CM | POA: Diagnosis not present

## 2011-10-14 DIAGNOSIS — N186 End stage renal disease: Secondary | ICD-10-CM | POA: Diagnosis not present

## 2011-10-14 DIAGNOSIS — N2581 Secondary hyperparathyroidism of renal origin: Secondary | ICD-10-CM | POA: Diagnosis not present

## 2011-10-15 DIAGNOSIS — I5022 Chronic systolic (congestive) heart failure: Secondary | ICD-10-CM | POA: Diagnosis not present

## 2011-10-15 DIAGNOSIS — E785 Hyperlipidemia, unspecified: Secondary | ICD-10-CM | POA: Diagnosis not present

## 2011-10-15 DIAGNOSIS — E119 Type 2 diabetes mellitus without complications: Secondary | ICD-10-CM | POA: Diagnosis not present

## 2011-10-15 DIAGNOSIS — N186 End stage renal disease: Secondary | ICD-10-CM | POA: Diagnosis not present

## 2011-10-15 DIAGNOSIS — I10 Essential (primary) hypertension: Secondary | ICD-10-CM | POA: Diagnosis not present

## 2011-10-16 DIAGNOSIS — N186 End stage renal disease: Secondary | ICD-10-CM | POA: Diagnosis not present

## 2011-10-16 DIAGNOSIS — D631 Anemia in chronic kidney disease: Secondary | ICD-10-CM | POA: Diagnosis not present

## 2011-10-16 DIAGNOSIS — N2581 Secondary hyperparathyroidism of renal origin: Secondary | ICD-10-CM | POA: Diagnosis not present

## 2011-10-16 DIAGNOSIS — N039 Chronic nephritic syndrome with unspecified morphologic changes: Secondary | ICD-10-CM | POA: Diagnosis not present

## 2011-10-19 DIAGNOSIS — D631 Anemia in chronic kidney disease: Secondary | ICD-10-CM | POA: Diagnosis not present

## 2011-10-19 DIAGNOSIS — N039 Chronic nephritic syndrome with unspecified morphologic changes: Secondary | ICD-10-CM | POA: Diagnosis not present

## 2011-10-19 DIAGNOSIS — N2581 Secondary hyperparathyroidism of renal origin: Secondary | ICD-10-CM | POA: Diagnosis not present

## 2011-10-19 DIAGNOSIS — N186 End stage renal disease: Secondary | ICD-10-CM | POA: Diagnosis not present

## 2011-10-21 DIAGNOSIS — N2581 Secondary hyperparathyroidism of renal origin: Secondary | ICD-10-CM | POA: Diagnosis not present

## 2011-10-21 DIAGNOSIS — N186 End stage renal disease: Secondary | ICD-10-CM | POA: Diagnosis not present

## 2011-10-21 DIAGNOSIS — D631 Anemia in chronic kidney disease: Secondary | ICD-10-CM | POA: Diagnosis not present

## 2011-10-21 DIAGNOSIS — N039 Chronic nephritic syndrome with unspecified morphologic changes: Secondary | ICD-10-CM | POA: Diagnosis not present

## 2011-10-23 DIAGNOSIS — N186 End stage renal disease: Secondary | ICD-10-CM | POA: Diagnosis not present

## 2011-10-23 DIAGNOSIS — N2581 Secondary hyperparathyroidism of renal origin: Secondary | ICD-10-CM | POA: Diagnosis not present

## 2011-10-23 DIAGNOSIS — N039 Chronic nephritic syndrome with unspecified morphologic changes: Secondary | ICD-10-CM | POA: Diagnosis not present

## 2011-10-23 DIAGNOSIS — D631 Anemia in chronic kidney disease: Secondary | ICD-10-CM | POA: Diagnosis not present

## 2011-10-26 DIAGNOSIS — D631 Anemia in chronic kidney disease: Secondary | ICD-10-CM | POA: Diagnosis not present

## 2011-10-26 DIAGNOSIS — N186 End stage renal disease: Secondary | ICD-10-CM | POA: Diagnosis not present

## 2011-10-26 DIAGNOSIS — N2581 Secondary hyperparathyroidism of renal origin: Secondary | ICD-10-CM | POA: Diagnosis not present

## 2011-10-26 DIAGNOSIS — N039 Chronic nephritic syndrome with unspecified morphologic changes: Secondary | ICD-10-CM | POA: Diagnosis not present

## 2011-10-28 DIAGNOSIS — N186 End stage renal disease: Secondary | ICD-10-CM | POA: Diagnosis not present

## 2011-10-28 DIAGNOSIS — E119 Type 2 diabetes mellitus without complications: Secondary | ICD-10-CM | POA: Diagnosis not present

## 2011-10-28 DIAGNOSIS — N2581 Secondary hyperparathyroidism of renal origin: Secondary | ICD-10-CM | POA: Diagnosis not present

## 2011-10-28 DIAGNOSIS — N039 Chronic nephritic syndrome with unspecified morphologic changes: Secondary | ICD-10-CM | POA: Diagnosis not present

## 2011-10-28 DIAGNOSIS — D631 Anemia in chronic kidney disease: Secondary | ICD-10-CM | POA: Diagnosis not present

## 2011-10-29 DIAGNOSIS — E785 Hyperlipidemia, unspecified: Secondary | ICD-10-CM | POA: Diagnosis not present

## 2011-10-29 DIAGNOSIS — E119 Type 2 diabetes mellitus without complications: Secondary | ICD-10-CM | POA: Diagnosis not present

## 2011-10-29 DIAGNOSIS — N186 End stage renal disease: Secondary | ICD-10-CM | POA: Diagnosis not present

## 2011-10-29 DIAGNOSIS — I10 Essential (primary) hypertension: Secondary | ICD-10-CM | POA: Diagnosis not present

## 2011-10-29 DIAGNOSIS — Z01810 Encounter for preprocedural cardiovascular examination: Secondary | ICD-10-CM | POA: Diagnosis not present

## 2011-10-29 DIAGNOSIS — I5022 Chronic systolic (congestive) heart failure: Secondary | ICD-10-CM | POA: Diagnosis not present

## 2011-10-30 DIAGNOSIS — N186 End stage renal disease: Secondary | ICD-10-CM | POA: Diagnosis not present

## 2011-10-30 DIAGNOSIS — D631 Anemia in chronic kidney disease: Secondary | ICD-10-CM | POA: Diagnosis not present

## 2011-10-30 DIAGNOSIS — N039 Chronic nephritic syndrome with unspecified morphologic changes: Secondary | ICD-10-CM | POA: Diagnosis not present

## 2011-10-30 DIAGNOSIS — N2581 Secondary hyperparathyroidism of renal origin: Secondary | ICD-10-CM | POA: Diagnosis not present

## 2011-11-02 DIAGNOSIS — N2581 Secondary hyperparathyroidism of renal origin: Secondary | ICD-10-CM | POA: Diagnosis not present

## 2011-11-02 DIAGNOSIS — N039 Chronic nephritic syndrome with unspecified morphologic changes: Secondary | ICD-10-CM | POA: Diagnosis not present

## 2011-11-02 DIAGNOSIS — D631 Anemia in chronic kidney disease: Secondary | ICD-10-CM | POA: Diagnosis not present

## 2011-11-02 DIAGNOSIS — N186 End stage renal disease: Secondary | ICD-10-CM | POA: Diagnosis not present

## 2011-11-04 DIAGNOSIS — N039 Chronic nephritic syndrome with unspecified morphologic changes: Secondary | ICD-10-CM | POA: Diagnosis not present

## 2011-11-04 DIAGNOSIS — D631 Anemia in chronic kidney disease: Secondary | ICD-10-CM | POA: Diagnosis not present

## 2011-11-04 DIAGNOSIS — N2581 Secondary hyperparathyroidism of renal origin: Secondary | ICD-10-CM | POA: Diagnosis not present

## 2011-11-04 DIAGNOSIS — N186 End stage renal disease: Secondary | ICD-10-CM | POA: Diagnosis not present

## 2011-11-06 DIAGNOSIS — D631 Anemia in chronic kidney disease: Secondary | ICD-10-CM | POA: Diagnosis not present

## 2011-11-06 DIAGNOSIS — N2581 Secondary hyperparathyroidism of renal origin: Secondary | ICD-10-CM | POA: Diagnosis not present

## 2011-11-06 DIAGNOSIS — N186 End stage renal disease: Secondary | ICD-10-CM | POA: Diagnosis not present

## 2011-11-06 DIAGNOSIS — N039 Chronic nephritic syndrome with unspecified morphologic changes: Secondary | ICD-10-CM | POA: Diagnosis not present

## 2011-11-09 DIAGNOSIS — N2581 Secondary hyperparathyroidism of renal origin: Secondary | ICD-10-CM | POA: Diagnosis not present

## 2011-11-09 DIAGNOSIS — N039 Chronic nephritic syndrome with unspecified morphologic changes: Secondary | ICD-10-CM | POA: Diagnosis not present

## 2011-11-09 DIAGNOSIS — D631 Anemia in chronic kidney disease: Secondary | ICD-10-CM | POA: Diagnosis not present

## 2011-11-09 DIAGNOSIS — N186 End stage renal disease: Secondary | ICD-10-CM | POA: Diagnosis not present

## 2011-11-10 ENCOUNTER — Ambulatory Visit (INDEPENDENT_AMBULATORY_CARE_PROVIDER_SITE_OTHER): Payer: Medicare Other | Admitting: Ophthalmology

## 2011-11-10 DIAGNOSIS — H43819 Vitreous degeneration, unspecified eye: Secondary | ICD-10-CM

## 2011-11-10 DIAGNOSIS — E11359 Type 2 diabetes mellitus with proliferative diabetic retinopathy without macular edema: Secondary | ICD-10-CM | POA: Diagnosis not present

## 2011-11-10 DIAGNOSIS — E1139 Type 2 diabetes mellitus with other diabetic ophthalmic complication: Secondary | ICD-10-CM | POA: Diagnosis not present

## 2011-11-10 DIAGNOSIS — E1165 Type 2 diabetes mellitus with hyperglycemia: Secondary | ICD-10-CM | POA: Diagnosis not present

## 2011-11-11 DIAGNOSIS — N039 Chronic nephritic syndrome with unspecified morphologic changes: Secondary | ICD-10-CM | POA: Diagnosis not present

## 2011-11-11 DIAGNOSIS — D631 Anemia in chronic kidney disease: Secondary | ICD-10-CM | POA: Diagnosis not present

## 2011-11-11 DIAGNOSIS — N2581 Secondary hyperparathyroidism of renal origin: Secondary | ICD-10-CM | POA: Diagnosis not present

## 2011-11-11 DIAGNOSIS — N186 End stage renal disease: Secondary | ICD-10-CM | POA: Diagnosis not present

## 2011-11-13 DIAGNOSIS — D631 Anemia in chronic kidney disease: Secondary | ICD-10-CM | POA: Diagnosis not present

## 2011-11-13 DIAGNOSIS — N039 Chronic nephritic syndrome with unspecified morphologic changes: Secondary | ICD-10-CM | POA: Diagnosis not present

## 2011-11-13 DIAGNOSIS — N186 End stage renal disease: Secondary | ICD-10-CM | POA: Diagnosis not present

## 2011-11-13 DIAGNOSIS — N2581 Secondary hyperparathyroidism of renal origin: Secondary | ICD-10-CM | POA: Diagnosis not present

## 2011-11-16 DIAGNOSIS — D631 Anemia in chronic kidney disease: Secondary | ICD-10-CM | POA: Diagnosis not present

## 2011-11-16 DIAGNOSIS — N2581 Secondary hyperparathyroidism of renal origin: Secondary | ICD-10-CM | POA: Diagnosis not present

## 2011-11-16 DIAGNOSIS — N186 End stage renal disease: Secondary | ICD-10-CM | POA: Diagnosis not present

## 2011-11-16 DIAGNOSIS — N039 Chronic nephritic syndrome with unspecified morphologic changes: Secondary | ICD-10-CM | POA: Diagnosis not present

## 2011-11-18 DIAGNOSIS — N2581 Secondary hyperparathyroidism of renal origin: Secondary | ICD-10-CM | POA: Diagnosis not present

## 2011-11-18 DIAGNOSIS — D631 Anemia in chronic kidney disease: Secondary | ICD-10-CM | POA: Diagnosis not present

## 2011-11-18 DIAGNOSIS — N039 Chronic nephritic syndrome with unspecified morphologic changes: Secondary | ICD-10-CM | POA: Diagnosis not present

## 2011-11-18 DIAGNOSIS — N186 End stage renal disease: Secondary | ICD-10-CM | POA: Diagnosis not present

## 2011-11-19 DIAGNOSIS — H4011X0 Primary open-angle glaucoma, stage unspecified: Secondary | ICD-10-CM | POA: Diagnosis not present

## 2011-11-19 DIAGNOSIS — H33019 Retinal detachment with single break, unspecified eye: Secondary | ICD-10-CM | POA: Diagnosis not present

## 2011-11-20 DIAGNOSIS — N039 Chronic nephritic syndrome with unspecified morphologic changes: Secondary | ICD-10-CM | POA: Diagnosis not present

## 2011-11-20 DIAGNOSIS — N2581 Secondary hyperparathyroidism of renal origin: Secondary | ICD-10-CM | POA: Diagnosis not present

## 2011-11-20 DIAGNOSIS — N186 End stage renal disease: Secondary | ICD-10-CM | POA: Diagnosis not present

## 2011-11-20 DIAGNOSIS — D631 Anemia in chronic kidney disease: Secondary | ICD-10-CM | POA: Diagnosis not present

## 2011-11-21 DIAGNOSIS — N186 End stage renal disease: Secondary | ICD-10-CM | POA: Diagnosis not present

## 2011-11-23 DIAGNOSIS — Z23 Encounter for immunization: Secondary | ICD-10-CM | POA: Diagnosis not present

## 2011-11-23 DIAGNOSIS — D509 Iron deficiency anemia, unspecified: Secondary | ICD-10-CM | POA: Diagnosis not present

## 2011-11-23 DIAGNOSIS — N186 End stage renal disease: Secondary | ICD-10-CM | POA: Diagnosis not present

## 2011-11-23 DIAGNOSIS — N039 Chronic nephritic syndrome with unspecified morphologic changes: Secondary | ICD-10-CM | POA: Diagnosis not present

## 2011-11-23 DIAGNOSIS — N2581 Secondary hyperparathyroidism of renal origin: Secondary | ICD-10-CM | POA: Diagnosis not present

## 2011-11-23 DIAGNOSIS — B379 Candidiasis, unspecified: Secondary | ICD-10-CM | POA: Diagnosis not present

## 2011-11-23 DIAGNOSIS — D631 Anemia in chronic kidney disease: Secondary | ICD-10-CM | POA: Diagnosis not present

## 2011-11-25 DIAGNOSIS — Z23 Encounter for immunization: Secondary | ICD-10-CM | POA: Diagnosis not present

## 2011-11-25 DIAGNOSIS — D631 Anemia in chronic kidney disease: Secondary | ICD-10-CM | POA: Diagnosis not present

## 2011-11-25 DIAGNOSIS — D509 Iron deficiency anemia, unspecified: Secondary | ICD-10-CM | POA: Diagnosis not present

## 2011-11-25 DIAGNOSIS — N2581 Secondary hyperparathyroidism of renal origin: Secondary | ICD-10-CM | POA: Diagnosis not present

## 2011-11-25 DIAGNOSIS — N039 Chronic nephritic syndrome with unspecified morphologic changes: Secondary | ICD-10-CM | POA: Diagnosis not present

## 2011-11-25 DIAGNOSIS — B379 Candidiasis, unspecified: Secondary | ICD-10-CM | POA: Diagnosis not present

## 2011-11-25 DIAGNOSIS — N186 End stage renal disease: Secondary | ICD-10-CM | POA: Diagnosis not present

## 2011-11-25 DIAGNOSIS — E119 Type 2 diabetes mellitus without complications: Secondary | ICD-10-CM | POA: Diagnosis not present

## 2011-11-27 DIAGNOSIS — N2581 Secondary hyperparathyroidism of renal origin: Secondary | ICD-10-CM | POA: Diagnosis not present

## 2011-11-27 DIAGNOSIS — N039 Chronic nephritic syndrome with unspecified morphologic changes: Secondary | ICD-10-CM | POA: Diagnosis not present

## 2011-11-27 DIAGNOSIS — N186 End stage renal disease: Secondary | ICD-10-CM | POA: Diagnosis not present

## 2011-11-27 DIAGNOSIS — B379 Candidiasis, unspecified: Secondary | ICD-10-CM | POA: Diagnosis not present

## 2011-11-27 DIAGNOSIS — D631 Anemia in chronic kidney disease: Secondary | ICD-10-CM | POA: Diagnosis not present

## 2011-11-27 DIAGNOSIS — D509 Iron deficiency anemia, unspecified: Secondary | ICD-10-CM | POA: Diagnosis not present

## 2011-11-27 DIAGNOSIS — Z23 Encounter for immunization: Secondary | ICD-10-CM | POA: Diagnosis not present

## 2011-11-30 DIAGNOSIS — D631 Anemia in chronic kidney disease: Secondary | ICD-10-CM | POA: Diagnosis not present

## 2011-11-30 DIAGNOSIS — N2581 Secondary hyperparathyroidism of renal origin: Secondary | ICD-10-CM | POA: Diagnosis not present

## 2011-11-30 DIAGNOSIS — N039 Chronic nephritic syndrome with unspecified morphologic changes: Secondary | ICD-10-CM | POA: Diagnosis not present

## 2011-11-30 DIAGNOSIS — N186 End stage renal disease: Secondary | ICD-10-CM | POA: Diagnosis not present

## 2011-11-30 DIAGNOSIS — B379 Candidiasis, unspecified: Secondary | ICD-10-CM | POA: Diagnosis not present

## 2011-11-30 DIAGNOSIS — D509 Iron deficiency anemia, unspecified: Secondary | ICD-10-CM | POA: Diagnosis not present

## 2011-11-30 DIAGNOSIS — Z23 Encounter for immunization: Secondary | ICD-10-CM | POA: Diagnosis not present

## 2011-12-02 DIAGNOSIS — N039 Chronic nephritic syndrome with unspecified morphologic changes: Secondary | ICD-10-CM | POA: Diagnosis not present

## 2011-12-02 DIAGNOSIS — B379 Candidiasis, unspecified: Secondary | ICD-10-CM | POA: Diagnosis not present

## 2011-12-02 DIAGNOSIS — N186 End stage renal disease: Secondary | ICD-10-CM | POA: Diagnosis not present

## 2011-12-02 DIAGNOSIS — D509 Iron deficiency anemia, unspecified: Secondary | ICD-10-CM | POA: Diagnosis not present

## 2011-12-02 DIAGNOSIS — N2581 Secondary hyperparathyroidism of renal origin: Secondary | ICD-10-CM | POA: Diagnosis not present

## 2011-12-02 DIAGNOSIS — Z23 Encounter for immunization: Secondary | ICD-10-CM | POA: Diagnosis not present

## 2011-12-02 DIAGNOSIS — D631 Anemia in chronic kidney disease: Secondary | ICD-10-CM | POA: Diagnosis not present

## 2011-12-04 DIAGNOSIS — N2581 Secondary hyperparathyroidism of renal origin: Secondary | ICD-10-CM | POA: Diagnosis not present

## 2011-12-04 DIAGNOSIS — Z23 Encounter for immunization: Secondary | ICD-10-CM | POA: Diagnosis not present

## 2011-12-04 DIAGNOSIS — D509 Iron deficiency anemia, unspecified: Secondary | ICD-10-CM | POA: Diagnosis not present

## 2011-12-04 DIAGNOSIS — N039 Chronic nephritic syndrome with unspecified morphologic changes: Secondary | ICD-10-CM | POA: Diagnosis not present

## 2011-12-04 DIAGNOSIS — N186 End stage renal disease: Secondary | ICD-10-CM | POA: Diagnosis not present

## 2011-12-04 DIAGNOSIS — B379 Candidiasis, unspecified: Secondary | ICD-10-CM | POA: Diagnosis not present

## 2011-12-04 DIAGNOSIS — D631 Anemia in chronic kidney disease: Secondary | ICD-10-CM | POA: Diagnosis not present

## 2011-12-07 DIAGNOSIS — D509 Iron deficiency anemia, unspecified: Secondary | ICD-10-CM | POA: Diagnosis not present

## 2011-12-07 DIAGNOSIS — D631 Anemia in chronic kidney disease: Secondary | ICD-10-CM | POA: Diagnosis not present

## 2011-12-07 DIAGNOSIS — Z23 Encounter for immunization: Secondary | ICD-10-CM | POA: Diagnosis not present

## 2011-12-07 DIAGNOSIS — N2581 Secondary hyperparathyroidism of renal origin: Secondary | ICD-10-CM | POA: Diagnosis not present

## 2011-12-07 DIAGNOSIS — N039 Chronic nephritic syndrome with unspecified morphologic changes: Secondary | ICD-10-CM | POA: Diagnosis not present

## 2011-12-07 DIAGNOSIS — B379 Candidiasis, unspecified: Secondary | ICD-10-CM | POA: Diagnosis not present

## 2011-12-07 DIAGNOSIS — N186 End stage renal disease: Secondary | ICD-10-CM | POA: Diagnosis not present

## 2011-12-09 DIAGNOSIS — B379 Candidiasis, unspecified: Secondary | ICD-10-CM | POA: Diagnosis not present

## 2011-12-09 DIAGNOSIS — D631 Anemia in chronic kidney disease: Secondary | ICD-10-CM | POA: Diagnosis not present

## 2011-12-09 DIAGNOSIS — N2581 Secondary hyperparathyroidism of renal origin: Secondary | ICD-10-CM | POA: Diagnosis not present

## 2011-12-09 DIAGNOSIS — N186 End stage renal disease: Secondary | ICD-10-CM | POA: Diagnosis not present

## 2011-12-09 DIAGNOSIS — D509 Iron deficiency anemia, unspecified: Secondary | ICD-10-CM | POA: Diagnosis not present

## 2011-12-09 DIAGNOSIS — N039 Chronic nephritic syndrome with unspecified morphologic changes: Secondary | ICD-10-CM | POA: Diagnosis not present

## 2011-12-09 DIAGNOSIS — Z23 Encounter for immunization: Secondary | ICD-10-CM | POA: Diagnosis not present

## 2011-12-11 DIAGNOSIS — N2581 Secondary hyperparathyroidism of renal origin: Secondary | ICD-10-CM | POA: Diagnosis not present

## 2011-12-11 DIAGNOSIS — D509 Iron deficiency anemia, unspecified: Secondary | ICD-10-CM | POA: Diagnosis not present

## 2011-12-11 DIAGNOSIS — Z23 Encounter for immunization: Secondary | ICD-10-CM | POA: Diagnosis not present

## 2011-12-11 DIAGNOSIS — N039 Chronic nephritic syndrome with unspecified morphologic changes: Secondary | ICD-10-CM | POA: Diagnosis not present

## 2011-12-11 DIAGNOSIS — N186 End stage renal disease: Secondary | ICD-10-CM | POA: Diagnosis not present

## 2011-12-11 DIAGNOSIS — B379 Candidiasis, unspecified: Secondary | ICD-10-CM | POA: Diagnosis not present

## 2011-12-11 DIAGNOSIS — D631 Anemia in chronic kidney disease: Secondary | ICD-10-CM | POA: Diagnosis not present

## 2011-12-13 DIAGNOSIS — T82898A Other specified complication of vascular prosthetic devices, implants and grafts, initial encounter: Secondary | ICD-10-CM | POA: Diagnosis not present

## 2011-12-13 DIAGNOSIS — N186 End stage renal disease: Secondary | ICD-10-CM | POA: Diagnosis not present

## 2011-12-14 DIAGNOSIS — D509 Iron deficiency anemia, unspecified: Secondary | ICD-10-CM | POA: Diagnosis not present

## 2011-12-14 DIAGNOSIS — Z23 Encounter for immunization: Secondary | ICD-10-CM | POA: Diagnosis not present

## 2011-12-14 DIAGNOSIS — E1149 Type 2 diabetes mellitus with other diabetic neurological complication: Secondary | ICD-10-CM | POA: Diagnosis not present

## 2011-12-14 DIAGNOSIS — M109 Gout, unspecified: Secondary | ICD-10-CM | POA: Diagnosis not present

## 2011-12-14 DIAGNOSIS — E1129 Type 2 diabetes mellitus with other diabetic kidney complication: Secondary | ICD-10-CM | POA: Diagnosis not present

## 2011-12-14 DIAGNOSIS — N2581 Secondary hyperparathyroidism of renal origin: Secondary | ICD-10-CM | POA: Diagnosis not present

## 2011-12-14 DIAGNOSIS — N039 Chronic nephritic syndrome with unspecified morphologic changes: Secondary | ICD-10-CM | POA: Diagnosis not present

## 2011-12-14 DIAGNOSIS — B379 Candidiasis, unspecified: Secondary | ICD-10-CM | POA: Diagnosis not present

## 2011-12-14 DIAGNOSIS — D631 Anemia in chronic kidney disease: Secondary | ICD-10-CM | POA: Diagnosis not present

## 2011-12-14 DIAGNOSIS — E7800 Pure hypercholesterolemia, unspecified: Secondary | ICD-10-CM | POA: Diagnosis not present

## 2011-12-14 DIAGNOSIS — N186 End stage renal disease: Secondary | ICD-10-CM | POA: Diagnosis not present

## 2011-12-14 DIAGNOSIS — Z79899 Other long term (current) drug therapy: Secondary | ICD-10-CM | POA: Diagnosis not present

## 2011-12-14 DIAGNOSIS — I119 Hypertensive heart disease without heart failure: Secondary | ICD-10-CM | POA: Diagnosis not present

## 2011-12-16 DIAGNOSIS — Z23 Encounter for immunization: Secondary | ICD-10-CM | POA: Diagnosis not present

## 2011-12-16 DIAGNOSIS — B379 Candidiasis, unspecified: Secondary | ICD-10-CM | POA: Diagnosis not present

## 2011-12-16 DIAGNOSIS — N2581 Secondary hyperparathyroidism of renal origin: Secondary | ICD-10-CM | POA: Diagnosis not present

## 2011-12-16 DIAGNOSIS — N186 End stage renal disease: Secondary | ICD-10-CM | POA: Diagnosis not present

## 2011-12-16 DIAGNOSIS — D509 Iron deficiency anemia, unspecified: Secondary | ICD-10-CM | POA: Diagnosis not present

## 2011-12-16 DIAGNOSIS — N039 Chronic nephritic syndrome with unspecified morphologic changes: Secondary | ICD-10-CM | POA: Diagnosis not present

## 2011-12-16 DIAGNOSIS — D631 Anemia in chronic kidney disease: Secondary | ICD-10-CM | POA: Diagnosis not present

## 2011-12-18 DIAGNOSIS — D631 Anemia in chronic kidney disease: Secondary | ICD-10-CM | POA: Diagnosis not present

## 2011-12-18 DIAGNOSIS — N186 End stage renal disease: Secondary | ICD-10-CM | POA: Diagnosis not present

## 2011-12-18 DIAGNOSIS — N039 Chronic nephritic syndrome with unspecified morphologic changes: Secondary | ICD-10-CM | POA: Diagnosis not present

## 2011-12-18 DIAGNOSIS — D509 Iron deficiency anemia, unspecified: Secondary | ICD-10-CM | POA: Diagnosis not present

## 2011-12-18 DIAGNOSIS — B379 Candidiasis, unspecified: Secondary | ICD-10-CM | POA: Diagnosis not present

## 2011-12-18 DIAGNOSIS — Z23 Encounter for immunization: Secondary | ICD-10-CM | POA: Diagnosis not present

## 2011-12-18 DIAGNOSIS — N2581 Secondary hyperparathyroidism of renal origin: Secondary | ICD-10-CM | POA: Diagnosis not present

## 2011-12-21 DIAGNOSIS — D631 Anemia in chronic kidney disease: Secondary | ICD-10-CM | POA: Diagnosis not present

## 2011-12-21 DIAGNOSIS — N186 End stage renal disease: Secondary | ICD-10-CM | POA: Diagnosis not present

## 2011-12-21 DIAGNOSIS — N039 Chronic nephritic syndrome with unspecified morphologic changes: Secondary | ICD-10-CM | POA: Diagnosis not present

## 2011-12-21 DIAGNOSIS — N2581 Secondary hyperparathyroidism of renal origin: Secondary | ICD-10-CM | POA: Diagnosis not present

## 2011-12-23 DIAGNOSIS — N2581 Secondary hyperparathyroidism of renal origin: Secondary | ICD-10-CM | POA: Diagnosis not present

## 2011-12-23 DIAGNOSIS — E119 Type 2 diabetes mellitus without complications: Secondary | ICD-10-CM | POA: Diagnosis not present

## 2011-12-23 DIAGNOSIS — D631 Anemia in chronic kidney disease: Secondary | ICD-10-CM | POA: Diagnosis not present

## 2011-12-23 DIAGNOSIS — N039 Chronic nephritic syndrome with unspecified morphologic changes: Secondary | ICD-10-CM | POA: Diagnosis not present

## 2011-12-23 DIAGNOSIS — N186 End stage renal disease: Secondary | ICD-10-CM | POA: Diagnosis not present

## 2011-12-23 DIAGNOSIS — M109 Gout, unspecified: Secondary | ICD-10-CM | POA: Diagnosis not present

## 2011-12-25 DIAGNOSIS — N186 End stage renal disease: Secondary | ICD-10-CM | POA: Diagnosis not present

## 2011-12-25 DIAGNOSIS — N039 Chronic nephritic syndrome with unspecified morphologic changes: Secondary | ICD-10-CM | POA: Diagnosis not present

## 2011-12-25 DIAGNOSIS — D631 Anemia in chronic kidney disease: Secondary | ICD-10-CM | POA: Diagnosis not present

## 2011-12-25 DIAGNOSIS — N2581 Secondary hyperparathyroidism of renal origin: Secondary | ICD-10-CM | POA: Diagnosis not present

## 2011-12-28 DIAGNOSIS — N186 End stage renal disease: Secondary | ICD-10-CM | POA: Diagnosis not present

## 2011-12-28 DIAGNOSIS — N039 Chronic nephritic syndrome with unspecified morphologic changes: Secondary | ICD-10-CM | POA: Diagnosis not present

## 2011-12-28 DIAGNOSIS — D631 Anemia in chronic kidney disease: Secondary | ICD-10-CM | POA: Diagnosis not present

## 2011-12-28 DIAGNOSIS — N2581 Secondary hyperparathyroidism of renal origin: Secondary | ICD-10-CM | POA: Diagnosis not present

## 2011-12-30 DIAGNOSIS — N2581 Secondary hyperparathyroidism of renal origin: Secondary | ICD-10-CM | POA: Diagnosis not present

## 2011-12-30 DIAGNOSIS — N039 Chronic nephritic syndrome with unspecified morphologic changes: Secondary | ICD-10-CM | POA: Diagnosis not present

## 2011-12-30 DIAGNOSIS — D631 Anemia in chronic kidney disease: Secondary | ICD-10-CM | POA: Diagnosis not present

## 2011-12-30 DIAGNOSIS — N186 End stage renal disease: Secondary | ICD-10-CM | POA: Diagnosis not present

## 2012-01-01 DIAGNOSIS — N186 End stage renal disease: Secondary | ICD-10-CM | POA: Diagnosis not present

## 2012-01-01 DIAGNOSIS — N039 Chronic nephritic syndrome with unspecified morphologic changes: Secondary | ICD-10-CM | POA: Diagnosis not present

## 2012-01-01 DIAGNOSIS — D631 Anemia in chronic kidney disease: Secondary | ICD-10-CM | POA: Diagnosis not present

## 2012-01-01 DIAGNOSIS — N2581 Secondary hyperparathyroidism of renal origin: Secondary | ICD-10-CM | POA: Diagnosis not present

## 2012-01-04 DIAGNOSIS — N039 Chronic nephritic syndrome with unspecified morphologic changes: Secondary | ICD-10-CM | POA: Diagnosis not present

## 2012-01-04 DIAGNOSIS — N2581 Secondary hyperparathyroidism of renal origin: Secondary | ICD-10-CM | POA: Diagnosis not present

## 2012-01-04 DIAGNOSIS — D631 Anemia in chronic kidney disease: Secondary | ICD-10-CM | POA: Diagnosis not present

## 2012-01-04 DIAGNOSIS — N186 End stage renal disease: Secondary | ICD-10-CM | POA: Diagnosis not present

## 2012-01-06 DIAGNOSIS — N2581 Secondary hyperparathyroidism of renal origin: Secondary | ICD-10-CM | POA: Diagnosis not present

## 2012-01-06 DIAGNOSIS — N039 Chronic nephritic syndrome with unspecified morphologic changes: Secondary | ICD-10-CM | POA: Diagnosis not present

## 2012-01-06 DIAGNOSIS — D631 Anemia in chronic kidney disease: Secondary | ICD-10-CM | POA: Diagnosis not present

## 2012-01-06 DIAGNOSIS — N186 End stage renal disease: Secondary | ICD-10-CM | POA: Diagnosis not present

## 2012-01-11 DIAGNOSIS — N039 Chronic nephritic syndrome with unspecified morphologic changes: Secondary | ICD-10-CM | POA: Diagnosis not present

## 2012-01-11 DIAGNOSIS — D631 Anemia in chronic kidney disease: Secondary | ICD-10-CM | POA: Diagnosis not present

## 2012-01-11 DIAGNOSIS — N2581 Secondary hyperparathyroidism of renal origin: Secondary | ICD-10-CM | POA: Diagnosis not present

## 2012-01-11 DIAGNOSIS — N186 End stage renal disease: Secondary | ICD-10-CM | POA: Diagnosis not present

## 2012-01-13 DIAGNOSIS — N2581 Secondary hyperparathyroidism of renal origin: Secondary | ICD-10-CM | POA: Diagnosis not present

## 2012-01-13 DIAGNOSIS — D631 Anemia in chronic kidney disease: Secondary | ICD-10-CM | POA: Diagnosis not present

## 2012-01-13 DIAGNOSIS — N039 Chronic nephritic syndrome with unspecified morphologic changes: Secondary | ICD-10-CM | POA: Diagnosis not present

## 2012-01-13 DIAGNOSIS — N186 End stage renal disease: Secondary | ICD-10-CM | POA: Diagnosis not present

## 2012-01-15 DIAGNOSIS — N186 End stage renal disease: Secondary | ICD-10-CM | POA: Diagnosis not present

## 2012-01-15 DIAGNOSIS — D631 Anemia in chronic kidney disease: Secondary | ICD-10-CM | POA: Diagnosis not present

## 2012-01-15 DIAGNOSIS — N2581 Secondary hyperparathyroidism of renal origin: Secondary | ICD-10-CM | POA: Diagnosis not present

## 2012-01-15 DIAGNOSIS — N039 Chronic nephritic syndrome with unspecified morphologic changes: Secondary | ICD-10-CM | POA: Diagnosis not present

## 2012-01-18 DIAGNOSIS — D631 Anemia in chronic kidney disease: Secondary | ICD-10-CM | POA: Diagnosis not present

## 2012-01-18 DIAGNOSIS — N2581 Secondary hyperparathyroidism of renal origin: Secondary | ICD-10-CM | POA: Diagnosis not present

## 2012-01-18 DIAGNOSIS — N039 Chronic nephritic syndrome with unspecified morphologic changes: Secondary | ICD-10-CM | POA: Diagnosis not present

## 2012-01-18 DIAGNOSIS — N186 End stage renal disease: Secondary | ICD-10-CM | POA: Diagnosis not present

## 2012-01-20 DIAGNOSIS — N039 Chronic nephritic syndrome with unspecified morphologic changes: Secondary | ICD-10-CM | POA: Diagnosis not present

## 2012-01-20 DIAGNOSIS — D631 Anemia in chronic kidney disease: Secondary | ICD-10-CM | POA: Diagnosis not present

## 2012-01-20 DIAGNOSIS — N186 End stage renal disease: Secondary | ICD-10-CM | POA: Diagnosis not present

## 2012-01-20 DIAGNOSIS — N2581 Secondary hyperparathyroidism of renal origin: Secondary | ICD-10-CM | POA: Diagnosis not present

## 2012-01-21 DIAGNOSIS — N186 End stage renal disease: Secondary | ICD-10-CM | POA: Diagnosis not present

## 2012-01-22 DIAGNOSIS — N186 End stage renal disease: Secondary | ICD-10-CM | POA: Diagnosis not present

## 2012-01-22 DIAGNOSIS — D509 Iron deficiency anemia, unspecified: Secondary | ICD-10-CM | POA: Diagnosis not present

## 2012-01-22 DIAGNOSIS — N039 Chronic nephritic syndrome with unspecified morphologic changes: Secondary | ICD-10-CM | POA: Diagnosis not present

## 2012-01-22 DIAGNOSIS — D631 Anemia in chronic kidney disease: Secondary | ICD-10-CM | POA: Diagnosis not present

## 2012-01-22 DIAGNOSIS — N2581 Secondary hyperparathyroidism of renal origin: Secondary | ICD-10-CM | POA: Diagnosis not present

## 2012-01-25 DIAGNOSIS — D631 Anemia in chronic kidney disease: Secondary | ICD-10-CM | POA: Diagnosis not present

## 2012-01-25 DIAGNOSIS — N039 Chronic nephritic syndrome with unspecified morphologic changes: Secondary | ICD-10-CM | POA: Diagnosis not present

## 2012-01-25 DIAGNOSIS — D509 Iron deficiency anemia, unspecified: Secondary | ICD-10-CM | POA: Diagnosis not present

## 2012-01-25 DIAGNOSIS — N186 End stage renal disease: Secondary | ICD-10-CM | POA: Diagnosis not present

## 2012-01-25 DIAGNOSIS — N2581 Secondary hyperparathyroidism of renal origin: Secondary | ICD-10-CM | POA: Diagnosis not present

## 2012-01-27 DIAGNOSIS — N186 End stage renal disease: Secondary | ICD-10-CM | POA: Diagnosis not present

## 2012-01-27 DIAGNOSIS — N2581 Secondary hyperparathyroidism of renal origin: Secondary | ICD-10-CM | POA: Diagnosis not present

## 2012-01-27 DIAGNOSIS — Z1159 Encounter for screening for other viral diseases: Secondary | ICD-10-CM | POA: Diagnosis not present

## 2012-01-27 DIAGNOSIS — D509 Iron deficiency anemia, unspecified: Secondary | ICD-10-CM | POA: Diagnosis not present

## 2012-01-27 DIAGNOSIS — N039 Chronic nephritic syndrome with unspecified morphologic changes: Secondary | ICD-10-CM | POA: Diagnosis not present

## 2012-01-27 DIAGNOSIS — D631 Anemia in chronic kidney disease: Secondary | ICD-10-CM | POA: Diagnosis not present

## 2012-01-29 DIAGNOSIS — N2581 Secondary hyperparathyroidism of renal origin: Secondary | ICD-10-CM | POA: Diagnosis not present

## 2012-01-29 DIAGNOSIS — N186 End stage renal disease: Secondary | ICD-10-CM | POA: Diagnosis not present

## 2012-01-29 DIAGNOSIS — N039 Chronic nephritic syndrome with unspecified morphologic changes: Secondary | ICD-10-CM | POA: Diagnosis not present

## 2012-01-29 DIAGNOSIS — D509 Iron deficiency anemia, unspecified: Secondary | ICD-10-CM | POA: Diagnosis not present

## 2012-01-29 DIAGNOSIS — D631 Anemia in chronic kidney disease: Secondary | ICD-10-CM | POA: Diagnosis not present

## 2012-02-01 DIAGNOSIS — D509 Iron deficiency anemia, unspecified: Secondary | ICD-10-CM | POA: Diagnosis not present

## 2012-02-01 DIAGNOSIS — N039 Chronic nephritic syndrome with unspecified morphologic changes: Secondary | ICD-10-CM | POA: Diagnosis not present

## 2012-02-01 DIAGNOSIS — N186 End stage renal disease: Secondary | ICD-10-CM | POA: Diagnosis not present

## 2012-02-01 DIAGNOSIS — N2581 Secondary hyperparathyroidism of renal origin: Secondary | ICD-10-CM | POA: Diagnosis not present

## 2012-02-01 DIAGNOSIS — D631 Anemia in chronic kidney disease: Secondary | ICD-10-CM | POA: Diagnosis not present

## 2012-02-03 DIAGNOSIS — N2581 Secondary hyperparathyroidism of renal origin: Secondary | ICD-10-CM | POA: Diagnosis not present

## 2012-02-03 DIAGNOSIS — D509 Iron deficiency anemia, unspecified: Secondary | ICD-10-CM | POA: Diagnosis not present

## 2012-02-03 DIAGNOSIS — N039 Chronic nephritic syndrome with unspecified morphologic changes: Secondary | ICD-10-CM | POA: Diagnosis not present

## 2012-02-03 DIAGNOSIS — N186 End stage renal disease: Secondary | ICD-10-CM | POA: Diagnosis not present

## 2012-02-03 DIAGNOSIS — D631 Anemia in chronic kidney disease: Secondary | ICD-10-CM | POA: Diagnosis not present

## 2012-02-05 DIAGNOSIS — D631 Anemia in chronic kidney disease: Secondary | ICD-10-CM | POA: Diagnosis not present

## 2012-02-05 DIAGNOSIS — N039 Chronic nephritic syndrome with unspecified morphologic changes: Secondary | ICD-10-CM | POA: Diagnosis not present

## 2012-02-05 DIAGNOSIS — D509 Iron deficiency anemia, unspecified: Secondary | ICD-10-CM | POA: Diagnosis not present

## 2012-02-05 DIAGNOSIS — N186 End stage renal disease: Secondary | ICD-10-CM | POA: Diagnosis not present

## 2012-02-05 DIAGNOSIS — N2581 Secondary hyperparathyroidism of renal origin: Secondary | ICD-10-CM | POA: Diagnosis not present

## 2012-02-08 DIAGNOSIS — D509 Iron deficiency anemia, unspecified: Secondary | ICD-10-CM | POA: Diagnosis not present

## 2012-02-08 DIAGNOSIS — N2581 Secondary hyperparathyroidism of renal origin: Secondary | ICD-10-CM | POA: Diagnosis not present

## 2012-02-08 DIAGNOSIS — N186 End stage renal disease: Secondary | ICD-10-CM | POA: Diagnosis not present

## 2012-02-08 DIAGNOSIS — D631 Anemia in chronic kidney disease: Secondary | ICD-10-CM | POA: Diagnosis not present

## 2012-02-08 DIAGNOSIS — N039 Chronic nephritic syndrome with unspecified morphologic changes: Secondary | ICD-10-CM | POA: Diagnosis not present

## 2012-02-10 DIAGNOSIS — N039 Chronic nephritic syndrome with unspecified morphologic changes: Secondary | ICD-10-CM | POA: Diagnosis not present

## 2012-02-10 DIAGNOSIS — D631 Anemia in chronic kidney disease: Secondary | ICD-10-CM | POA: Diagnosis not present

## 2012-02-10 DIAGNOSIS — N186 End stage renal disease: Secondary | ICD-10-CM | POA: Diagnosis not present

## 2012-02-10 DIAGNOSIS — N2581 Secondary hyperparathyroidism of renal origin: Secondary | ICD-10-CM | POA: Diagnosis not present

## 2012-02-10 DIAGNOSIS — D509 Iron deficiency anemia, unspecified: Secondary | ICD-10-CM | POA: Diagnosis not present

## 2012-02-12 DIAGNOSIS — N039 Chronic nephritic syndrome with unspecified morphologic changes: Secondary | ICD-10-CM | POA: Diagnosis not present

## 2012-02-12 DIAGNOSIS — N186 End stage renal disease: Secondary | ICD-10-CM | POA: Diagnosis not present

## 2012-02-12 DIAGNOSIS — N2581 Secondary hyperparathyroidism of renal origin: Secondary | ICD-10-CM | POA: Diagnosis not present

## 2012-02-12 DIAGNOSIS — D509 Iron deficiency anemia, unspecified: Secondary | ICD-10-CM | POA: Diagnosis not present

## 2012-02-12 DIAGNOSIS — D631 Anemia in chronic kidney disease: Secondary | ICD-10-CM | POA: Diagnosis not present

## 2012-02-14 DIAGNOSIS — N039 Chronic nephritic syndrome with unspecified morphologic changes: Secondary | ICD-10-CM | POA: Diagnosis not present

## 2012-02-14 DIAGNOSIS — D509 Iron deficiency anemia, unspecified: Secondary | ICD-10-CM | POA: Diagnosis not present

## 2012-02-14 DIAGNOSIS — N2581 Secondary hyperparathyroidism of renal origin: Secondary | ICD-10-CM | POA: Diagnosis not present

## 2012-02-14 DIAGNOSIS — D631 Anemia in chronic kidney disease: Secondary | ICD-10-CM | POA: Diagnosis not present

## 2012-02-14 DIAGNOSIS — N186 End stage renal disease: Secondary | ICD-10-CM | POA: Diagnosis not present

## 2012-02-16 DIAGNOSIS — N039 Chronic nephritic syndrome with unspecified morphologic changes: Secondary | ICD-10-CM | POA: Diagnosis not present

## 2012-02-16 DIAGNOSIS — N186 End stage renal disease: Secondary | ICD-10-CM | POA: Diagnosis not present

## 2012-02-16 DIAGNOSIS — N2581 Secondary hyperparathyroidism of renal origin: Secondary | ICD-10-CM | POA: Diagnosis not present

## 2012-02-16 DIAGNOSIS — D509 Iron deficiency anemia, unspecified: Secondary | ICD-10-CM | POA: Diagnosis not present

## 2012-02-16 DIAGNOSIS — D631 Anemia in chronic kidney disease: Secondary | ICD-10-CM | POA: Diagnosis not present

## 2012-02-19 DIAGNOSIS — D631 Anemia in chronic kidney disease: Secondary | ICD-10-CM | POA: Diagnosis not present

## 2012-02-19 DIAGNOSIS — N039 Chronic nephritic syndrome with unspecified morphologic changes: Secondary | ICD-10-CM | POA: Diagnosis not present

## 2012-02-19 DIAGNOSIS — D509 Iron deficiency anemia, unspecified: Secondary | ICD-10-CM | POA: Diagnosis not present

## 2012-02-19 DIAGNOSIS — N186 End stage renal disease: Secondary | ICD-10-CM | POA: Diagnosis not present

## 2012-02-19 DIAGNOSIS — N2581 Secondary hyperparathyroidism of renal origin: Secondary | ICD-10-CM | POA: Diagnosis not present

## 2012-02-20 DIAGNOSIS — N186 End stage renal disease: Secondary | ICD-10-CM | POA: Diagnosis not present

## 2012-02-22 DIAGNOSIS — N186 End stage renal disease: Secondary | ICD-10-CM | POA: Diagnosis not present

## 2012-02-22 DIAGNOSIS — N2581 Secondary hyperparathyroidism of renal origin: Secondary | ICD-10-CM | POA: Diagnosis not present

## 2012-02-22 DIAGNOSIS — D631 Anemia in chronic kidney disease: Secondary | ICD-10-CM | POA: Diagnosis not present

## 2012-02-22 DIAGNOSIS — N039 Chronic nephritic syndrome with unspecified morphologic changes: Secondary | ICD-10-CM | POA: Diagnosis not present

## 2012-02-24 DIAGNOSIS — E119 Type 2 diabetes mellitus without complications: Secondary | ICD-10-CM | POA: Diagnosis not present

## 2012-02-24 DIAGNOSIS — Z1159 Encounter for screening for other viral diseases: Secondary | ICD-10-CM | POA: Diagnosis not present

## 2012-03-20 DIAGNOSIS — M109 Gout, unspecified: Secondary | ICD-10-CM | POA: Diagnosis not present

## 2012-03-20 DIAGNOSIS — I5022 Chronic systolic (congestive) heart failure: Secondary | ICD-10-CM | POA: Diagnosis not present

## 2012-03-20 DIAGNOSIS — E7800 Pure hypercholesterolemia, unspecified: Secondary | ICD-10-CM | POA: Diagnosis not present

## 2012-03-20 DIAGNOSIS — IMO0001 Reserved for inherently not codable concepts without codable children: Secondary | ICD-10-CM | POA: Diagnosis not present

## 2012-03-20 DIAGNOSIS — E1149 Type 2 diabetes mellitus with other diabetic neurological complication: Secondary | ICD-10-CM | POA: Diagnosis not present

## 2012-03-20 DIAGNOSIS — Z79899 Other long term (current) drug therapy: Secondary | ICD-10-CM | POA: Diagnosis not present

## 2012-03-20 DIAGNOSIS — Z0000 Encounter for general adult medical examination without abnormal findings: Secondary | ICD-10-CM | POA: Diagnosis not present

## 2012-03-20 DIAGNOSIS — I119 Hypertensive heart disease without heart failure: Secondary | ICD-10-CM | POA: Diagnosis not present

## 2012-03-22 DIAGNOSIS — N186 End stage renal disease: Secondary | ICD-10-CM | POA: Diagnosis not present

## 2012-03-30 DIAGNOSIS — E119 Type 2 diabetes mellitus without complications: Secondary | ICD-10-CM | POA: Diagnosis not present

## 2012-04-22 DIAGNOSIS — N186 End stage renal disease: Secondary | ICD-10-CM | POA: Diagnosis not present

## 2012-04-27 DIAGNOSIS — E119 Type 2 diabetes mellitus without complications: Secondary | ICD-10-CM | POA: Diagnosis not present

## 2012-05-08 DIAGNOSIS — M25569 Pain in unspecified knee: Secondary | ICD-10-CM | POA: Diagnosis not present

## 2012-05-12 ENCOUNTER — Ambulatory Visit (INDEPENDENT_AMBULATORY_CARE_PROVIDER_SITE_OTHER): Payer: Medicare Other | Admitting: Ophthalmology

## 2012-05-12 DIAGNOSIS — M2240 Chondromalacia patellae, unspecified knee: Secondary | ICD-10-CM | POA: Diagnosis not present

## 2012-05-12 DIAGNOSIS — M25569 Pain in unspecified knee: Secondary | ICD-10-CM | POA: Diagnosis not present

## 2012-05-17 ENCOUNTER — Ambulatory Visit (INDEPENDENT_AMBULATORY_CARE_PROVIDER_SITE_OTHER): Payer: Medicare Other | Admitting: Ophthalmology

## 2012-05-17 DIAGNOSIS — E1065 Type 1 diabetes mellitus with hyperglycemia: Secondary | ICD-10-CM | POA: Diagnosis not present

## 2012-05-17 DIAGNOSIS — E1039 Type 1 diabetes mellitus with other diabetic ophthalmic complication: Secondary | ICD-10-CM

## 2012-05-17 DIAGNOSIS — H43819 Vitreous degeneration, unspecified eye: Secondary | ICD-10-CM

## 2012-05-17 DIAGNOSIS — E11359 Type 2 diabetes mellitus with proliferative diabetic retinopathy without macular edema: Secondary | ICD-10-CM | POA: Diagnosis not present

## 2012-05-17 DIAGNOSIS — H2510 Age-related nuclear cataract, unspecified eye: Secondary | ICD-10-CM | POA: Diagnosis not present

## 2012-05-17 DIAGNOSIS — M25569 Pain in unspecified knee: Secondary | ICD-10-CM | POA: Diagnosis not present

## 2012-05-17 DIAGNOSIS — M2240 Chondromalacia patellae, unspecified knee: Secondary | ICD-10-CM | POA: Diagnosis not present

## 2012-05-19 DIAGNOSIS — M25569 Pain in unspecified knee: Secondary | ICD-10-CM | POA: Diagnosis not present

## 2012-05-19 DIAGNOSIS — H4011X0 Primary open-angle glaucoma, stage unspecified: Secondary | ICD-10-CM | POA: Diagnosis not present

## 2012-05-19 DIAGNOSIS — H33019 Retinal detachment with single break, unspecified eye: Secondary | ICD-10-CM | POA: Diagnosis not present

## 2012-05-19 DIAGNOSIS — M2240 Chondromalacia patellae, unspecified knee: Secondary | ICD-10-CM | POA: Diagnosis not present

## 2012-05-20 DIAGNOSIS — N186 End stage renal disease: Secondary | ICD-10-CM | POA: Diagnosis not present

## 2012-05-22 DIAGNOSIS — M25569 Pain in unspecified knee: Secondary | ICD-10-CM | POA: Diagnosis not present

## 2012-05-22 DIAGNOSIS — M2240 Chondromalacia patellae, unspecified knee: Secondary | ICD-10-CM | POA: Diagnosis not present

## 2012-05-24 DIAGNOSIS — M25569 Pain in unspecified knee: Secondary | ICD-10-CM | POA: Diagnosis not present

## 2012-05-24 DIAGNOSIS — M2240 Chondromalacia patellae, unspecified knee: Secondary | ICD-10-CM | POA: Diagnosis not present

## 2012-05-29 DIAGNOSIS — M25569 Pain in unspecified knee: Secondary | ICD-10-CM | POA: Diagnosis not present

## 2012-05-31 ENCOUNTER — Encounter (INDEPENDENT_AMBULATORY_CARE_PROVIDER_SITE_OTHER): Payer: Medicare Other | Admitting: Ophthalmology

## 2012-05-31 DIAGNOSIS — E11359 Type 2 diabetes mellitus with proliferative diabetic retinopathy without macular edema: Secondary | ICD-10-CM

## 2012-05-31 DIAGNOSIS — E1165 Type 2 diabetes mellitus with hyperglycemia: Secondary | ICD-10-CM | POA: Diagnosis not present

## 2012-05-31 DIAGNOSIS — E1139 Type 2 diabetes mellitus with other diabetic ophthalmic complication: Secondary | ICD-10-CM | POA: Diagnosis not present

## 2012-06-05 DIAGNOSIS — M2240 Chondromalacia patellae, unspecified knee: Secondary | ICD-10-CM | POA: Diagnosis not present

## 2012-06-07 DIAGNOSIS — M2240 Chondromalacia patellae, unspecified knee: Secondary | ICD-10-CM | POA: Diagnosis not present

## 2012-06-20 DIAGNOSIS — N186 End stage renal disease: Secondary | ICD-10-CM | POA: Diagnosis not present

## 2012-06-22 DIAGNOSIS — M109 Gout, unspecified: Secondary | ICD-10-CM | POA: Diagnosis not present

## 2012-06-22 DIAGNOSIS — E119 Type 2 diabetes mellitus without complications: Secondary | ICD-10-CM | POA: Diagnosis not present

## 2012-06-26 DIAGNOSIS — M2240 Chondromalacia patellae, unspecified knee: Secondary | ICD-10-CM | POA: Diagnosis not present

## 2012-07-03 DIAGNOSIS — G609 Hereditary and idiopathic neuropathy, unspecified: Secondary | ICD-10-CM | POA: Diagnosis not present

## 2012-07-03 DIAGNOSIS — E119 Type 2 diabetes mellitus without complications: Secondary | ICD-10-CM | POA: Diagnosis not present

## 2012-07-03 DIAGNOSIS — E7800 Pure hypercholesterolemia, unspecified: Secondary | ICD-10-CM | POA: Diagnosis not present

## 2012-07-03 DIAGNOSIS — E1129 Type 2 diabetes mellitus with other diabetic kidney complication: Secondary | ICD-10-CM | POA: Diagnosis not present

## 2012-07-03 DIAGNOSIS — E1149 Type 2 diabetes mellitus with other diabetic neurological complication: Secondary | ICD-10-CM | POA: Diagnosis not present

## 2012-07-03 DIAGNOSIS — I10 Essential (primary) hypertension: Secondary | ICD-10-CM | POA: Diagnosis not present

## 2012-07-03 DIAGNOSIS — I119 Hypertensive heart disease without heart failure: Secondary | ICD-10-CM | POA: Diagnosis not present

## 2012-07-03 DIAGNOSIS — IMO0001 Reserved for inherently not codable concepts without codable children: Secondary | ICD-10-CM | POA: Diagnosis not present

## 2012-07-03 DIAGNOSIS — I5022 Chronic systolic (congestive) heart failure: Secondary | ICD-10-CM | POA: Diagnosis not present

## 2012-07-03 DIAGNOSIS — Z0000 Encounter for general adult medical examination without abnormal findings: Secondary | ICD-10-CM | POA: Diagnosis not present

## 2012-07-03 DIAGNOSIS — N186 End stage renal disease: Secondary | ICD-10-CM | POA: Diagnosis not present

## 2012-07-20 DIAGNOSIS — N186 End stage renal disease: Secondary | ICD-10-CM | POA: Diagnosis not present

## 2012-07-21 DIAGNOSIS — I951 Orthostatic hypotension: Secondary | ICD-10-CM | POA: Diagnosis not present

## 2012-07-21 DIAGNOSIS — E785 Hyperlipidemia, unspecified: Secondary | ICD-10-CM | POA: Diagnosis not present

## 2012-07-21 DIAGNOSIS — I10 Essential (primary) hypertension: Secondary | ICD-10-CM | POA: Diagnosis not present

## 2012-07-21 DIAGNOSIS — N186 End stage renal disease: Secondary | ICD-10-CM | POA: Diagnosis not present

## 2012-07-21 DIAGNOSIS — I428 Other cardiomyopathies: Secondary | ICD-10-CM | POA: Diagnosis not present

## 2012-07-21 DIAGNOSIS — I5022 Chronic systolic (congestive) heart failure: Secondary | ICD-10-CM | POA: Diagnosis not present

## 2012-07-21 DIAGNOSIS — E119 Type 2 diabetes mellitus without complications: Secondary | ICD-10-CM | POA: Diagnosis not present

## 2012-07-24 DIAGNOSIS — Z01818 Encounter for other preprocedural examination: Secondary | ICD-10-CM | POA: Diagnosis not present

## 2012-07-24 DIAGNOSIS — N189 Chronic kidney disease, unspecified: Secondary | ICD-10-CM | POA: Diagnosis not present

## 2012-07-27 DIAGNOSIS — Z1159 Encounter for screening for other viral diseases: Secondary | ICD-10-CM | POA: Diagnosis not present

## 2012-07-27 DIAGNOSIS — E119 Type 2 diabetes mellitus without complications: Secondary | ICD-10-CM | POA: Diagnosis not present

## 2012-07-28 DIAGNOSIS — N186 End stage renal disease: Secondary | ICD-10-CM | POA: Diagnosis not present

## 2012-07-28 DIAGNOSIS — Z01810 Encounter for preprocedural cardiovascular examination: Secondary | ICD-10-CM | POA: Diagnosis not present

## 2012-08-07 ENCOUNTER — Encounter: Payer: Self-pay | Admitting: *Deleted

## 2012-08-07 ENCOUNTER — Encounter: Payer: 59 | Attending: Endocrinology | Admitting: *Deleted

## 2012-08-07 DIAGNOSIS — Z713 Dietary counseling and surveillance: Secondary | ICD-10-CM | POA: Diagnosis not present

## 2012-08-07 DIAGNOSIS — N186 End stage renal disease: Secondary | ICD-10-CM | POA: Diagnosis not present

## 2012-08-07 DIAGNOSIS — Z992 Dependence on renal dialysis: Secondary | ICD-10-CM | POA: Insufficient documentation

## 2012-08-07 DIAGNOSIS — E119 Type 2 diabetes mellitus without complications: Secondary | ICD-10-CM | POA: Insufficient documentation

## 2012-08-07 NOTE — Progress Notes (Signed)
  Medical Nutrition Therapy:  Appt start time: 0930 end time:  1030.  Assessment:  Primary concerns today: patient here for diabetes and ESRD. Lives alone, states his Mom lives nearby and she shops and prepares his meals. He SMBG 3 times a day both before and after meals, stating they range from 56 to 203 mg/dl. Infrequent hypoglycemia. Dialysis days are Tuesday, Thursday and Saturdays. He stated he is not on any protein or potassium restriction, but is on phosphorus restriction. Later he stated he avoids potatoes and bananas due to potassium restriction. His primary goal today is to learn more about foods to manage his BG better and to lose enough weight to be able to be on a transplant list out of [place].  MEDICATIONS: see list Diabetes medication is Novolog 70/30 @ sliding scale taken after meal based on BG's higher than 150 mg/dl   DIETARY INTAKE:  Usual eating pattern includes 2-3 meals and 0-2 snacks per day.  Everyday foods include fair variety of all food groups.  Avoided foods include high fat foods and foods high in phosphorus or potassium.    24-hr recall:  B ( AM): used to have 4 eggs, 1 toast and 2 bacon before dialysis, on non-dialysis days; 1 hash brown with fruit oatmeal, coffee with 3 pkts sugar and non-dairy creamer occasionally, NOW 3 egg whites with toast and no bacon. Snk ( AM): none, on dialysis  L ( PM): lean meat sandwich OR soup OR salad with lean meat and [place] or [place] or [place] dressing, water or lemonade or regular soda Snk ( PM): fresh fruit or popcorn or cheese crackers D ( PM): meat, starch, vegetable type meal, with bread or garlic bread, water or lemonade or regular soda  Snk ( PM): none Beverages: water or lemonade or regular soda, coffee  Usual physical activity: treadmill 3 days a week at home on non-dialysis days.  Estimated energy needs: 1600 calories 180 g carbohydrates 120 g protein 44 g fat  Progress Towards Goal(s):  In  progress.   Nutritional Diagnosis:  NB-1.1 Food and nutrition-related knowledge deficit As related to diabetes management.  As evidenced by A1c of 8.1%.    Intervention:  Nutrition counseling and diabetes education initiated. Discussed Carb Counting and reading food labels as method for portion control for weight loss and better BG management. Also discussed insulin action of Novolog 70/30 insulin which he stated he had not heard before. He expressed good verbal understanding of the rationale of a more consistent carb intake to help stabilize his BG going forward. .  Handouts given during visit include: Carb Counting and Food Label handouts Meal Plan Card Menu Planner Sheet  Insulin Action handout  [place] Choose a Meal Plan Booklet  Monitoring/Evaluation:  Dietary intake, exercise, reading food labels, and body weight prn. Patient states he will call to make follow up appointment.

## 2012-08-07 NOTE — Patient Instructions (Signed)
Plan:  Aim for 4 Carb Choices per meal (60 grams) +/- 1 either way  Aim for 0-2 Carbs per snack if hungry  Consider reading food labels for Total Carbohydrate of foods Continue with your current activity level as tolerated Continue checking BG at alternate times per day as directed by MD  Consider taking medication as directed by MD

## 2012-09-21 DIAGNOSIS — E119 Type 2 diabetes mellitus without complications: Secondary | ICD-10-CM | POA: Diagnosis not present

## 2012-09-28 ENCOUNTER — Ambulatory Visit (INDEPENDENT_AMBULATORY_CARE_PROVIDER_SITE_OTHER): Payer: Self-pay | Admitting: Ophthalmology

## 2012-09-29 ENCOUNTER — Ambulatory Visit (INDEPENDENT_AMBULATORY_CARE_PROVIDER_SITE_OTHER): Payer: 59 | Admitting: Ophthalmology

## 2012-09-29 DIAGNOSIS — E1039 Type 1 diabetes mellitus with other diabetic ophthalmic complication: Secondary | ICD-10-CM

## 2012-09-29 DIAGNOSIS — H3340 Traction detachment of retina, unspecified eye: Secondary | ICD-10-CM

## 2012-09-29 DIAGNOSIS — H43819 Vitreous degeneration, unspecified eye: Secondary | ICD-10-CM

## 2012-09-29 DIAGNOSIS — E11359 Type 2 diabetes mellitus with proliferative diabetic retinopathy without macular edema: Secondary | ICD-10-CM

## 2012-10-04 ENCOUNTER — Ambulatory Visit (INDEPENDENT_AMBULATORY_CARE_PROVIDER_SITE_OTHER): Payer: Medicare Other | Admitting: Ophthalmology

## 2012-11-20 DIAGNOSIS — N186 End stage renal disease: Secondary | ICD-10-CM | POA: Diagnosis not present

## 2012-11-21 DIAGNOSIS — N039 Chronic nephritic syndrome with unspecified morphologic changes: Secondary | ICD-10-CM | POA: Diagnosis not present

## 2012-11-21 DIAGNOSIS — N186 End stage renal disease: Secondary | ICD-10-CM | POA: Diagnosis not present

## 2012-11-21 DIAGNOSIS — D631 Anemia in chronic kidney disease: Secondary | ICD-10-CM | POA: Diagnosis not present

## 2012-11-21 DIAGNOSIS — N2581 Secondary hyperparathyroidism of renal origin: Secondary | ICD-10-CM | POA: Diagnosis not present

## 2012-11-23 DIAGNOSIS — N186 End stage renal disease: Secondary | ICD-10-CM | POA: Diagnosis not present

## 2012-11-23 DIAGNOSIS — E119 Type 2 diabetes mellitus without complications: Secondary | ICD-10-CM | POA: Diagnosis not present

## 2012-12-11 DIAGNOSIS — Z79899 Other long term (current) drug therapy: Secondary | ICD-10-CM | POA: Diagnosis not present

## 2012-12-11 DIAGNOSIS — IMO0001 Reserved for inherently not codable concepts without codable children: Secondary | ICD-10-CM | POA: Diagnosis not present

## 2012-12-11 DIAGNOSIS — Z0000 Encounter for general adult medical examination without abnormal findings: Secondary | ICD-10-CM | POA: Diagnosis not present

## 2012-12-11 DIAGNOSIS — E1149 Type 2 diabetes mellitus with other diabetic neurological complication: Secondary | ICD-10-CM | POA: Diagnosis not present

## 2012-12-11 DIAGNOSIS — E1129 Type 2 diabetes mellitus with other diabetic kidney complication: Secondary | ICD-10-CM | POA: Diagnosis not present

## 2012-12-11 DIAGNOSIS — I119 Hypertensive heart disease without heart failure: Secondary | ICD-10-CM | POA: Diagnosis not present

## 2012-12-11 DIAGNOSIS — E7800 Pure hypercholesterolemia, unspecified: Secondary | ICD-10-CM | POA: Diagnosis not present

## 2012-12-11 DIAGNOSIS — Z23 Encounter for immunization: Secondary | ICD-10-CM | POA: Diagnosis not present

## 2012-12-18 DIAGNOSIS — Z01810 Encounter for preprocedural cardiovascular examination: Secondary | ICD-10-CM | POA: Diagnosis not present

## 2012-12-18 DIAGNOSIS — E669 Obesity, unspecified: Secondary | ICD-10-CM | POA: Diagnosis not present

## 2012-12-18 DIAGNOSIS — I5022 Chronic systolic (congestive) heart failure: Secondary | ICD-10-CM | POA: Diagnosis not present

## 2012-12-18 DIAGNOSIS — I428 Other cardiomyopathies: Secondary | ICD-10-CM | POA: Diagnosis not present

## 2012-12-18 DIAGNOSIS — N186 End stage renal disease: Secondary | ICD-10-CM | POA: Diagnosis not present

## 2012-12-18 DIAGNOSIS — E119 Type 2 diabetes mellitus without complications: Secondary | ICD-10-CM | POA: Diagnosis not present

## 2012-12-20 DIAGNOSIS — N186 End stage renal disease: Secondary | ICD-10-CM | POA: Diagnosis not present

## 2012-12-21 DIAGNOSIS — D631 Anemia in chronic kidney disease: Secondary | ICD-10-CM | POA: Diagnosis not present

## 2012-12-21 DIAGNOSIS — E119 Type 2 diabetes mellitus without complications: Secondary | ICD-10-CM | POA: Diagnosis not present

## 2012-12-21 DIAGNOSIS — N2581 Secondary hyperparathyroidism of renal origin: Secondary | ICD-10-CM | POA: Diagnosis not present

## 2012-12-21 DIAGNOSIS — N186 End stage renal disease: Secondary | ICD-10-CM | POA: Diagnosis not present

## 2012-12-21 DIAGNOSIS — N039 Chronic nephritic syndrome with unspecified morphologic changes: Secondary | ICD-10-CM | POA: Diagnosis not present

## 2012-12-21 DIAGNOSIS — D509 Iron deficiency anemia, unspecified: Secondary | ICD-10-CM | POA: Diagnosis not present

## 2012-12-25 ENCOUNTER — Other Ambulatory Visit: Payer: Self-pay | Admitting: Interventional Cardiology

## 2013-01-13 ENCOUNTER — Other Ambulatory Visit: Payer: Self-pay | Admitting: Interventional Cardiology

## 2013-01-16 ENCOUNTER — Telehealth: Payer: Self-pay | Admitting: Interventional Cardiology

## 2013-01-16 NOTE — Telephone Encounter (Signed)
New problem   Calling about cardiac clearance for kidney transplant for pt. Please call

## 2013-01-16 NOTE — Telephone Encounter (Signed)
Follow up   Pt called to adv's Dr Tamala Julian of a call he will be getting from Mrs Ilean China his pre transplant coordinator she will be calling sometime today.  Her number is # [phone]

## 2013-01-20 DIAGNOSIS — N2581 Secondary hyperparathyroidism of renal origin: Secondary | ICD-10-CM | POA: Diagnosis not present

## 2013-01-20 DIAGNOSIS — N186 End stage renal disease: Secondary | ICD-10-CM | POA: Diagnosis not present

## 2013-01-20 DIAGNOSIS — D509 Iron deficiency anemia, unspecified: Secondary | ICD-10-CM | POA: Diagnosis not present

## 2013-01-20 DIAGNOSIS — N039 Chronic nephritic syndrome with unspecified morphologic changes: Secondary | ICD-10-CM | POA: Diagnosis not present

## 2013-01-20 DIAGNOSIS — D631 Anemia in chronic kidney disease: Secondary | ICD-10-CM | POA: Diagnosis not present

## 2013-01-23 DIAGNOSIS — N2581 Secondary hyperparathyroidism of renal origin: Secondary | ICD-10-CM | POA: Diagnosis not present

## 2013-01-23 DIAGNOSIS — N039 Chronic nephritic syndrome with unspecified morphologic changes: Secondary | ICD-10-CM | POA: Diagnosis not present

## 2013-01-23 DIAGNOSIS — D631 Anemia in chronic kidney disease: Secondary | ICD-10-CM | POA: Diagnosis not present

## 2013-01-23 DIAGNOSIS — N186 End stage renal disease: Secondary | ICD-10-CM | POA: Diagnosis not present

## 2013-01-23 DIAGNOSIS — D509 Iron deficiency anemia, unspecified: Secondary | ICD-10-CM | POA: Diagnosis not present

## 2013-01-25 DIAGNOSIS — N2581 Secondary hyperparathyroidism of renal origin: Secondary | ICD-10-CM | POA: Diagnosis not present

## 2013-01-25 DIAGNOSIS — D509 Iron deficiency anemia, unspecified: Secondary | ICD-10-CM | POA: Diagnosis not present

## 2013-01-25 DIAGNOSIS — N039 Chronic nephritic syndrome with unspecified morphologic changes: Secondary | ICD-10-CM | POA: Diagnosis not present

## 2013-01-25 DIAGNOSIS — D631 Anemia in chronic kidney disease: Secondary | ICD-10-CM | POA: Diagnosis not present

## 2013-01-25 DIAGNOSIS — N186 End stage renal disease: Secondary | ICD-10-CM | POA: Diagnosis not present

## 2013-01-25 DIAGNOSIS — Z1159 Encounter for screening for other viral diseases: Secondary | ICD-10-CM | POA: Diagnosis not present

## 2013-01-27 DIAGNOSIS — D631 Anemia in chronic kidney disease: Secondary | ICD-10-CM | POA: Diagnosis not present

## 2013-01-27 DIAGNOSIS — N039 Chronic nephritic syndrome with unspecified morphologic changes: Secondary | ICD-10-CM | POA: Diagnosis not present

## 2013-01-27 DIAGNOSIS — N186 End stage renal disease: Secondary | ICD-10-CM | POA: Diagnosis not present

## 2013-01-27 DIAGNOSIS — N2581 Secondary hyperparathyroidism of renal origin: Secondary | ICD-10-CM | POA: Diagnosis not present

## 2013-01-27 DIAGNOSIS — D509 Iron deficiency anemia, unspecified: Secondary | ICD-10-CM | POA: Diagnosis not present

## 2013-01-30 DIAGNOSIS — D509 Iron deficiency anemia, unspecified: Secondary | ICD-10-CM | POA: Diagnosis not present

## 2013-01-30 DIAGNOSIS — D631 Anemia in chronic kidney disease: Secondary | ICD-10-CM | POA: Diagnosis not present

## 2013-01-30 DIAGNOSIS — N2581 Secondary hyperparathyroidism of renal origin: Secondary | ICD-10-CM | POA: Diagnosis not present

## 2013-01-30 DIAGNOSIS — N186 End stage renal disease: Secondary | ICD-10-CM | POA: Diagnosis not present

## 2013-01-30 DIAGNOSIS — N039 Chronic nephritic syndrome with unspecified morphologic changes: Secondary | ICD-10-CM | POA: Diagnosis not present

## 2013-02-01 DIAGNOSIS — D509 Iron deficiency anemia, unspecified: Secondary | ICD-10-CM | POA: Diagnosis not present

## 2013-02-01 DIAGNOSIS — N039 Chronic nephritic syndrome with unspecified morphologic changes: Secondary | ICD-10-CM | POA: Diagnosis not present

## 2013-02-01 DIAGNOSIS — D631 Anemia in chronic kidney disease: Secondary | ICD-10-CM | POA: Diagnosis not present

## 2013-02-01 DIAGNOSIS — N186 End stage renal disease: Secondary | ICD-10-CM | POA: Diagnosis not present

## 2013-02-01 DIAGNOSIS — N2581 Secondary hyperparathyroidism of renal origin: Secondary | ICD-10-CM | POA: Diagnosis not present

## 2013-02-03 DIAGNOSIS — N2581 Secondary hyperparathyroidism of renal origin: Secondary | ICD-10-CM | POA: Diagnosis not present

## 2013-02-03 DIAGNOSIS — N186 End stage renal disease: Secondary | ICD-10-CM | POA: Diagnosis not present

## 2013-02-03 DIAGNOSIS — D631 Anemia in chronic kidney disease: Secondary | ICD-10-CM | POA: Diagnosis not present

## 2013-02-03 DIAGNOSIS — D509 Iron deficiency anemia, unspecified: Secondary | ICD-10-CM | POA: Diagnosis not present

## 2013-02-03 DIAGNOSIS — N039 Chronic nephritic syndrome with unspecified morphologic changes: Secondary | ICD-10-CM | POA: Diagnosis not present

## 2013-02-06 DIAGNOSIS — N186 End stage renal disease: Secondary | ICD-10-CM | POA: Diagnosis not present

## 2013-02-06 DIAGNOSIS — D509 Iron deficiency anemia, unspecified: Secondary | ICD-10-CM | POA: Diagnosis not present

## 2013-02-06 DIAGNOSIS — N039 Chronic nephritic syndrome with unspecified morphologic changes: Secondary | ICD-10-CM | POA: Diagnosis not present

## 2013-02-06 DIAGNOSIS — N2581 Secondary hyperparathyroidism of renal origin: Secondary | ICD-10-CM | POA: Diagnosis not present

## 2013-02-06 DIAGNOSIS — D631 Anemia in chronic kidney disease: Secondary | ICD-10-CM | POA: Diagnosis not present

## 2013-02-08 DIAGNOSIS — D509 Iron deficiency anemia, unspecified: Secondary | ICD-10-CM | POA: Diagnosis not present

## 2013-02-08 DIAGNOSIS — N186 End stage renal disease: Secondary | ICD-10-CM | POA: Diagnosis not present

## 2013-02-08 DIAGNOSIS — N2581 Secondary hyperparathyroidism of renal origin: Secondary | ICD-10-CM | POA: Diagnosis not present

## 2013-02-08 DIAGNOSIS — N039 Chronic nephritic syndrome with unspecified morphologic changes: Secondary | ICD-10-CM | POA: Diagnosis not present

## 2013-02-08 DIAGNOSIS — D631 Anemia in chronic kidney disease: Secondary | ICD-10-CM | POA: Diagnosis not present

## 2013-02-10 DIAGNOSIS — N2581 Secondary hyperparathyroidism of renal origin: Secondary | ICD-10-CM | POA: Diagnosis not present

## 2013-02-10 DIAGNOSIS — D509 Iron deficiency anemia, unspecified: Secondary | ICD-10-CM | POA: Diagnosis not present

## 2013-02-10 DIAGNOSIS — D631 Anemia in chronic kidney disease: Secondary | ICD-10-CM | POA: Diagnosis not present

## 2013-02-10 DIAGNOSIS — N039 Chronic nephritic syndrome with unspecified morphologic changes: Secondary | ICD-10-CM | POA: Diagnosis not present

## 2013-02-10 DIAGNOSIS — N186 End stage renal disease: Secondary | ICD-10-CM | POA: Diagnosis not present

## 2013-02-12 DIAGNOSIS — D631 Anemia in chronic kidney disease: Secondary | ICD-10-CM | POA: Diagnosis not present

## 2013-02-12 DIAGNOSIS — N039 Chronic nephritic syndrome with unspecified morphologic changes: Secondary | ICD-10-CM | POA: Diagnosis not present

## 2013-02-12 DIAGNOSIS — N2581 Secondary hyperparathyroidism of renal origin: Secondary | ICD-10-CM | POA: Diagnosis not present

## 2013-02-12 DIAGNOSIS — D509 Iron deficiency anemia, unspecified: Secondary | ICD-10-CM | POA: Diagnosis not present

## 2013-02-12 DIAGNOSIS — N186 End stage renal disease: Secondary | ICD-10-CM | POA: Diagnosis not present

## 2013-02-14 DIAGNOSIS — N186 End stage renal disease: Secondary | ICD-10-CM | POA: Diagnosis not present

## 2013-02-14 DIAGNOSIS — D631 Anemia in chronic kidney disease: Secondary | ICD-10-CM | POA: Diagnosis not present

## 2013-02-14 DIAGNOSIS — D509 Iron deficiency anemia, unspecified: Secondary | ICD-10-CM | POA: Diagnosis not present

## 2013-02-14 DIAGNOSIS — N039 Chronic nephritic syndrome with unspecified morphologic changes: Secondary | ICD-10-CM | POA: Diagnosis not present

## 2013-02-14 DIAGNOSIS — N2581 Secondary hyperparathyroidism of renal origin: Secondary | ICD-10-CM | POA: Diagnosis not present

## 2013-02-17 DIAGNOSIS — N2581 Secondary hyperparathyroidism of renal origin: Secondary | ICD-10-CM | POA: Diagnosis not present

## 2013-02-17 DIAGNOSIS — N039 Chronic nephritic syndrome with unspecified morphologic changes: Secondary | ICD-10-CM | POA: Diagnosis not present

## 2013-02-17 DIAGNOSIS — D509 Iron deficiency anemia, unspecified: Secondary | ICD-10-CM | POA: Diagnosis not present

## 2013-02-17 DIAGNOSIS — D631 Anemia in chronic kidney disease: Secondary | ICD-10-CM | POA: Diagnosis not present

## 2013-02-17 DIAGNOSIS — N186 End stage renal disease: Secondary | ICD-10-CM | POA: Diagnosis not present

## 2013-02-19 DIAGNOSIS — N186 End stage renal disease: Secondary | ICD-10-CM | POA: Diagnosis not present

## 2013-02-19 DIAGNOSIS — T82898A Other specified complication of vascular prosthetic devices, implants and grafts, initial encounter: Secondary | ICD-10-CM | POA: Diagnosis not present

## 2013-02-20 DIAGNOSIS — N039 Chronic nephritic syndrome with unspecified morphologic changes: Secondary | ICD-10-CM | POA: Diagnosis not present

## 2013-02-20 DIAGNOSIS — N186 End stage renal disease: Secondary | ICD-10-CM | POA: Diagnosis not present

## 2013-02-20 DIAGNOSIS — N2581 Secondary hyperparathyroidism of renal origin: Secondary | ICD-10-CM | POA: Diagnosis not present

## 2013-02-20 DIAGNOSIS — D631 Anemia in chronic kidney disease: Secondary | ICD-10-CM | POA: Diagnosis not present

## 2013-02-21 DIAGNOSIS — IMO0001 Reserved for inherently not codable concepts without codable children: Secondary | ICD-10-CM | POA: Diagnosis not present

## 2013-02-22 DIAGNOSIS — Z1159 Encounter for screening for other viral diseases: Secondary | ICD-10-CM | POA: Diagnosis not present

## 2013-02-22 DIAGNOSIS — N186 End stage renal disease: Secondary | ICD-10-CM | POA: Diagnosis not present

## 2013-02-22 DIAGNOSIS — N039 Chronic nephritic syndrome with unspecified morphologic changes: Secondary | ICD-10-CM | POA: Diagnosis not present

## 2013-02-22 DIAGNOSIS — N2581 Secondary hyperparathyroidism of renal origin: Secondary | ICD-10-CM | POA: Diagnosis not present

## 2013-02-22 DIAGNOSIS — D631 Anemia in chronic kidney disease: Secondary | ICD-10-CM | POA: Diagnosis not present

## 2013-02-24 DIAGNOSIS — N039 Chronic nephritic syndrome with unspecified morphologic changes: Secondary | ICD-10-CM | POA: Diagnosis not present

## 2013-02-24 DIAGNOSIS — N186 End stage renal disease: Secondary | ICD-10-CM | POA: Diagnosis not present

## 2013-02-24 DIAGNOSIS — D631 Anemia in chronic kidney disease: Secondary | ICD-10-CM | POA: Diagnosis not present

## 2013-02-24 DIAGNOSIS — N2581 Secondary hyperparathyroidism of renal origin: Secondary | ICD-10-CM | POA: Diagnosis not present

## 2013-02-27 DIAGNOSIS — D631 Anemia in chronic kidney disease: Secondary | ICD-10-CM | POA: Diagnosis not present

## 2013-02-27 DIAGNOSIS — N186 End stage renal disease: Secondary | ICD-10-CM | POA: Diagnosis not present

## 2013-02-27 DIAGNOSIS — N2581 Secondary hyperparathyroidism of renal origin: Secondary | ICD-10-CM | POA: Diagnosis not present

## 2013-02-27 DIAGNOSIS — N039 Chronic nephritic syndrome with unspecified morphologic changes: Secondary | ICD-10-CM | POA: Diagnosis not present

## 2013-03-01 DIAGNOSIS — N186 End stage renal disease: Secondary | ICD-10-CM | POA: Diagnosis not present

## 2013-03-01 DIAGNOSIS — N2581 Secondary hyperparathyroidism of renal origin: Secondary | ICD-10-CM | POA: Diagnosis not present

## 2013-03-01 DIAGNOSIS — D631 Anemia in chronic kidney disease: Secondary | ICD-10-CM | POA: Diagnosis not present

## 2013-03-01 DIAGNOSIS — N039 Chronic nephritic syndrome with unspecified morphologic changes: Secondary | ICD-10-CM | POA: Diagnosis not present

## 2013-03-03 DIAGNOSIS — N186 End stage renal disease: Secondary | ICD-10-CM | POA: Diagnosis not present

## 2013-03-03 DIAGNOSIS — N2581 Secondary hyperparathyroidism of renal origin: Secondary | ICD-10-CM | POA: Diagnosis not present

## 2013-03-03 DIAGNOSIS — D631 Anemia in chronic kidney disease: Secondary | ICD-10-CM | POA: Diagnosis not present

## 2013-03-03 DIAGNOSIS — N039 Chronic nephritic syndrome with unspecified morphologic changes: Secondary | ICD-10-CM | POA: Diagnosis not present

## 2013-03-05 DIAGNOSIS — I509 Heart failure, unspecified: Secondary | ICD-10-CM | POA: Diagnosis not present

## 2013-03-05 DIAGNOSIS — E7800 Pure hypercholesterolemia, unspecified: Secondary | ICD-10-CM | POA: Diagnosis not present

## 2013-03-05 DIAGNOSIS — M1990 Unspecified osteoarthritis, unspecified site: Secondary | ICD-10-CM | POA: Diagnosis not present

## 2013-03-05 DIAGNOSIS — E1129 Type 2 diabetes mellitus with other diabetic kidney complication: Secondary | ICD-10-CM | POA: Diagnosis not present

## 2013-03-05 DIAGNOSIS — E1149 Type 2 diabetes mellitus with other diabetic neurological complication: Secondary | ICD-10-CM | POA: Diagnosis not present

## 2013-03-05 DIAGNOSIS — I110 Hypertensive heart disease with heart failure: Secondary | ICD-10-CM | POA: Diagnosis not present

## 2013-03-05 DIAGNOSIS — I5030 Unspecified diastolic (congestive) heart failure: Secondary | ICD-10-CM | POA: Diagnosis not present

## 2013-03-05 DIAGNOSIS — N186 End stage renal disease: Secondary | ICD-10-CM | POA: Diagnosis not present

## 2013-03-05 DIAGNOSIS — E1165 Type 2 diabetes mellitus with hyperglycemia: Secondary | ICD-10-CM | POA: Diagnosis not present

## 2013-03-06 DIAGNOSIS — N186 End stage renal disease: Secondary | ICD-10-CM | POA: Diagnosis not present

## 2013-03-06 DIAGNOSIS — N039 Chronic nephritic syndrome with unspecified morphologic changes: Secondary | ICD-10-CM | POA: Diagnosis not present

## 2013-03-06 DIAGNOSIS — D631 Anemia in chronic kidney disease: Secondary | ICD-10-CM | POA: Diagnosis not present

## 2013-03-06 DIAGNOSIS — N2581 Secondary hyperparathyroidism of renal origin: Secondary | ICD-10-CM | POA: Diagnosis not present

## 2013-03-08 DIAGNOSIS — N2581 Secondary hyperparathyroidism of renal origin: Secondary | ICD-10-CM | POA: Diagnosis not present

## 2013-03-08 DIAGNOSIS — N039 Chronic nephritic syndrome with unspecified morphologic changes: Secondary | ICD-10-CM | POA: Diagnosis not present

## 2013-03-08 DIAGNOSIS — N186 End stage renal disease: Secondary | ICD-10-CM | POA: Diagnosis not present

## 2013-03-08 DIAGNOSIS — D631 Anemia in chronic kidney disease: Secondary | ICD-10-CM | POA: Diagnosis not present

## 2013-03-10 DIAGNOSIS — N039 Chronic nephritic syndrome with unspecified morphologic changes: Secondary | ICD-10-CM | POA: Diagnosis not present

## 2013-03-10 DIAGNOSIS — N2581 Secondary hyperparathyroidism of renal origin: Secondary | ICD-10-CM | POA: Diagnosis not present

## 2013-03-10 DIAGNOSIS — D631 Anemia in chronic kidney disease: Secondary | ICD-10-CM | POA: Diagnosis not present

## 2013-03-10 DIAGNOSIS — N186 End stage renal disease: Secondary | ICD-10-CM | POA: Diagnosis not present

## 2013-03-12 DIAGNOSIS — N039 Chronic nephritic syndrome with unspecified morphologic changes: Secondary | ICD-10-CM | POA: Diagnosis not present

## 2013-03-12 DIAGNOSIS — N2581 Secondary hyperparathyroidism of renal origin: Secondary | ICD-10-CM | POA: Diagnosis not present

## 2013-03-12 DIAGNOSIS — N186 End stage renal disease: Secondary | ICD-10-CM | POA: Diagnosis not present

## 2013-03-12 DIAGNOSIS — D631 Anemia in chronic kidney disease: Secondary | ICD-10-CM | POA: Diagnosis not present

## 2013-03-14 DIAGNOSIS — N186 End stage renal disease: Secondary | ICD-10-CM | POA: Diagnosis not present

## 2013-03-14 DIAGNOSIS — N2581 Secondary hyperparathyroidism of renal origin: Secondary | ICD-10-CM | POA: Diagnosis not present

## 2013-03-14 DIAGNOSIS — D631 Anemia in chronic kidney disease: Secondary | ICD-10-CM | POA: Diagnosis not present

## 2013-03-14 DIAGNOSIS — N039 Chronic nephritic syndrome with unspecified morphologic changes: Secondary | ICD-10-CM | POA: Diagnosis not present

## 2013-03-17 DIAGNOSIS — N186 End stage renal disease: Secondary | ICD-10-CM | POA: Diagnosis not present

## 2013-03-17 DIAGNOSIS — N039 Chronic nephritic syndrome with unspecified morphologic changes: Secondary | ICD-10-CM | POA: Diagnosis not present

## 2013-03-17 DIAGNOSIS — N2581 Secondary hyperparathyroidism of renal origin: Secondary | ICD-10-CM | POA: Diagnosis not present

## 2013-03-17 DIAGNOSIS — D631 Anemia in chronic kidney disease: Secondary | ICD-10-CM | POA: Diagnosis not present

## 2013-03-19 DIAGNOSIS — N2581 Secondary hyperparathyroidism of renal origin: Secondary | ICD-10-CM | POA: Diagnosis not present

## 2013-03-19 DIAGNOSIS — N186 End stage renal disease: Secondary | ICD-10-CM | POA: Diagnosis not present

## 2013-03-19 DIAGNOSIS — N039 Chronic nephritic syndrome with unspecified morphologic changes: Secondary | ICD-10-CM | POA: Diagnosis not present

## 2013-03-19 DIAGNOSIS — D631 Anemia in chronic kidney disease: Secondary | ICD-10-CM | POA: Diagnosis not present

## 2013-03-21 DIAGNOSIS — N039 Chronic nephritic syndrome with unspecified morphologic changes: Secondary | ICD-10-CM | POA: Diagnosis not present

## 2013-03-21 DIAGNOSIS — N2581 Secondary hyperparathyroidism of renal origin: Secondary | ICD-10-CM | POA: Diagnosis not present

## 2013-03-21 DIAGNOSIS — N186 End stage renal disease: Secondary | ICD-10-CM | POA: Diagnosis not present

## 2013-03-21 DIAGNOSIS — D631 Anemia in chronic kidney disease: Secondary | ICD-10-CM | POA: Diagnosis not present

## 2013-03-22 DIAGNOSIS — N186 End stage renal disease: Secondary | ICD-10-CM | POA: Diagnosis not present

## 2013-03-24 DIAGNOSIS — N186 End stage renal disease: Secondary | ICD-10-CM | POA: Diagnosis not present

## 2013-03-24 DIAGNOSIS — Z23 Encounter for immunization: Secondary | ICD-10-CM | POA: Diagnosis not present

## 2013-03-24 DIAGNOSIS — N2581 Secondary hyperparathyroidism of renal origin: Secondary | ICD-10-CM | POA: Diagnosis not present

## 2013-03-24 DIAGNOSIS — D509 Iron deficiency anemia, unspecified: Secondary | ICD-10-CM | POA: Diagnosis not present

## 2013-03-24 DIAGNOSIS — D631 Anemia in chronic kidney disease: Secondary | ICD-10-CM | POA: Diagnosis not present

## 2013-03-24 DIAGNOSIS — N039 Chronic nephritic syndrome with unspecified morphologic changes: Secondary | ICD-10-CM | POA: Diagnosis not present

## 2013-03-29 DIAGNOSIS — E119 Type 2 diabetes mellitus without complications: Secondary | ICD-10-CM | POA: Diagnosis not present

## 2013-04-06 ENCOUNTER — Ambulatory Visit (INDEPENDENT_AMBULATORY_CARE_PROVIDER_SITE_OTHER): Payer: Medicare Other | Admitting: Ophthalmology

## 2013-04-06 DIAGNOSIS — H35039 Hypertensive retinopathy, unspecified eye: Secondary | ICD-10-CM

## 2013-04-06 DIAGNOSIS — I10 Essential (primary) hypertension: Secondary | ICD-10-CM | POA: Diagnosis not present

## 2013-04-06 DIAGNOSIS — E1165 Type 2 diabetes mellitus with hyperglycemia: Secondary | ICD-10-CM

## 2013-04-06 DIAGNOSIS — E11359 Type 2 diabetes mellitus with proliferative diabetic retinopathy without macular edema: Secondary | ICD-10-CM | POA: Diagnosis not present

## 2013-04-06 DIAGNOSIS — E1139 Type 2 diabetes mellitus with other diabetic ophthalmic complication: Secondary | ICD-10-CM | POA: Diagnosis not present

## 2013-04-06 DIAGNOSIS — H2510 Age-related nuclear cataract, unspecified eye: Secondary | ICD-10-CM

## 2013-04-22 DIAGNOSIS — N186 End stage renal disease: Secondary | ICD-10-CM | POA: Diagnosis not present

## 2013-04-24 DIAGNOSIS — N2581 Secondary hyperparathyroidism of renal origin: Secondary | ICD-10-CM | POA: Diagnosis not present

## 2013-04-24 DIAGNOSIS — N039 Chronic nephritic syndrome with unspecified morphologic changes: Secondary | ICD-10-CM | POA: Diagnosis not present

## 2013-04-24 DIAGNOSIS — D631 Anemia in chronic kidney disease: Secondary | ICD-10-CM | POA: Diagnosis not present

## 2013-04-24 DIAGNOSIS — N186 End stage renal disease: Secondary | ICD-10-CM | POA: Diagnosis not present

## 2013-04-26 DIAGNOSIS — E119 Type 2 diabetes mellitus without complications: Secondary | ICD-10-CM | POA: Diagnosis not present

## 2013-05-22 DIAGNOSIS — D631 Anemia in chronic kidney disease: Secondary | ICD-10-CM | POA: Diagnosis not present

## 2013-05-22 DIAGNOSIS — N039 Chronic nephritic syndrome with unspecified morphologic changes: Secondary | ICD-10-CM | POA: Diagnosis not present

## 2013-05-22 DIAGNOSIS — N186 End stage renal disease: Secondary | ICD-10-CM | POA: Diagnosis not present

## 2013-05-22 DIAGNOSIS — N2581 Secondary hyperparathyroidism of renal origin: Secondary | ICD-10-CM | POA: Diagnosis not present

## 2013-05-22 DIAGNOSIS — D509 Iron deficiency anemia, unspecified: Secondary | ICD-10-CM | POA: Diagnosis not present

## 2013-05-24 DIAGNOSIS — E119 Type 2 diabetes mellitus without complications: Secondary | ICD-10-CM | POA: Diagnosis not present

## 2013-06-19 DIAGNOSIS — N186 End stage renal disease: Secondary | ICD-10-CM | POA: Diagnosis not present

## 2013-06-20 DIAGNOSIS — N186 End stage renal disease: Secondary | ICD-10-CM | POA: Diagnosis not present

## 2013-06-21 DIAGNOSIS — D631 Anemia in chronic kidney disease: Secondary | ICD-10-CM | POA: Diagnosis not present

## 2013-06-21 DIAGNOSIS — N2581 Secondary hyperparathyroidism of renal origin: Secondary | ICD-10-CM | POA: Diagnosis not present

## 2013-06-21 DIAGNOSIS — N186 End stage renal disease: Secondary | ICD-10-CM | POA: Diagnosis not present

## 2013-06-21 DIAGNOSIS — N039 Chronic nephritic syndrome with unspecified morphologic changes: Secondary | ICD-10-CM | POA: Diagnosis not present

## 2013-06-21 DIAGNOSIS — E119 Type 2 diabetes mellitus without complications: Secondary | ICD-10-CM | POA: Diagnosis not present

## 2013-06-26 ENCOUNTER — Other Ambulatory Visit: Payer: Self-pay

## 2013-06-26 MED ORDER — CARVEDILOL 6.25 MG PO TABS: 6.2500 mg | ORAL_TABLET | Freq: Two times a day (BID) | ORAL | Status: DC

## 2013-06-29 DIAGNOSIS — N186 End stage renal disease: Secondary | ICD-10-CM | POA: Diagnosis not present

## 2013-06-29 DIAGNOSIS — I5020 Unspecified systolic (congestive) heart failure: Secondary | ICD-10-CM | POA: Diagnosis not present

## 2013-06-29 DIAGNOSIS — Z01818 Encounter for other preprocedural examination: Secondary | ICD-10-CM | POA: Diagnosis not present

## 2013-06-29 DIAGNOSIS — E119 Type 2 diabetes mellitus without complications: Secondary | ICD-10-CM | POA: Diagnosis not present

## 2013-06-29 DIAGNOSIS — I10 Essential (primary) hypertension: Secondary | ICD-10-CM | POA: Diagnosis not present

## 2013-07-02 DIAGNOSIS — E1165 Type 2 diabetes mellitus with hyperglycemia: Secondary | ICD-10-CM | POA: Diagnosis not present

## 2013-07-02 DIAGNOSIS — E1129 Type 2 diabetes mellitus with other diabetic kidney complication: Secondary | ICD-10-CM | POA: Diagnosis not present

## 2013-07-02 DIAGNOSIS — Z79899 Other long term (current) drug therapy: Secondary | ICD-10-CM | POA: Diagnosis not present

## 2013-07-02 DIAGNOSIS — I5030 Unspecified diastolic (congestive) heart failure: Secondary | ICD-10-CM | POA: Diagnosis not present

## 2013-07-02 DIAGNOSIS — E7800 Pure hypercholesterolemia, unspecified: Secondary | ICD-10-CM | POA: Diagnosis not present

## 2013-07-19 ENCOUNTER — Other Ambulatory Visit: Payer: Self-pay

## 2013-07-19 MED ORDER — ISOSORB DINITRATE-HYDRALAZINE 20-37.5 MG PO TABS: ORAL_TABLET | ORAL | Status: DC

## 2013-07-20 DIAGNOSIS — N186 End stage renal disease: Secondary | ICD-10-CM | POA: Diagnosis not present

## 2013-07-21 DIAGNOSIS — N039 Chronic nephritic syndrome with unspecified morphologic changes: Secondary | ICD-10-CM | POA: Diagnosis not present

## 2013-07-21 DIAGNOSIS — N186 End stage renal disease: Secondary | ICD-10-CM | POA: Diagnosis not present

## 2013-07-21 DIAGNOSIS — N2581 Secondary hyperparathyroidism of renal origin: Secondary | ICD-10-CM | POA: Diagnosis not present

## 2013-07-21 DIAGNOSIS — D631 Anemia in chronic kidney disease: Secondary | ICD-10-CM | POA: Diagnosis not present

## 2013-07-24 DIAGNOSIS — N2581 Secondary hyperparathyroidism of renal origin: Secondary | ICD-10-CM | POA: Diagnosis not present

## 2013-07-24 DIAGNOSIS — D631 Anemia in chronic kidney disease: Secondary | ICD-10-CM | POA: Diagnosis not present

## 2013-07-24 DIAGNOSIS — N186 End stage renal disease: Secondary | ICD-10-CM | POA: Diagnosis not present

## 2013-07-24 DIAGNOSIS — N039 Chronic nephritic syndrome with unspecified morphologic changes: Secondary | ICD-10-CM | POA: Diagnosis not present

## 2013-07-26 DIAGNOSIS — E119 Type 2 diabetes mellitus without complications: Secondary | ICD-10-CM | POA: Diagnosis not present

## 2013-07-26 DIAGNOSIS — N2581 Secondary hyperparathyroidism of renal origin: Secondary | ICD-10-CM | POA: Diagnosis not present

## 2013-07-26 DIAGNOSIS — N039 Chronic nephritic syndrome with unspecified morphologic changes: Secondary | ICD-10-CM | POA: Diagnosis not present

## 2013-07-26 DIAGNOSIS — N186 End stage renal disease: Secondary | ICD-10-CM | POA: Diagnosis not present

## 2013-07-26 DIAGNOSIS — D631 Anemia in chronic kidney disease: Secondary | ICD-10-CM | POA: Diagnosis not present

## 2013-07-28 DIAGNOSIS — N2581 Secondary hyperparathyroidism of renal origin: Secondary | ICD-10-CM | POA: Diagnosis not present

## 2013-07-28 DIAGNOSIS — N186 End stage renal disease: Secondary | ICD-10-CM | POA: Diagnosis not present

## 2013-07-28 DIAGNOSIS — D631 Anemia in chronic kidney disease: Secondary | ICD-10-CM | POA: Diagnosis not present

## 2013-07-28 DIAGNOSIS — N039 Chronic nephritic syndrome with unspecified morphologic changes: Secondary | ICD-10-CM | POA: Diagnosis not present

## 2013-07-31 DIAGNOSIS — N039 Chronic nephritic syndrome with unspecified morphologic changes: Secondary | ICD-10-CM | POA: Diagnosis not present

## 2013-07-31 DIAGNOSIS — D631 Anemia in chronic kidney disease: Secondary | ICD-10-CM | POA: Diagnosis not present

## 2013-07-31 DIAGNOSIS — N186 End stage renal disease: Secondary | ICD-10-CM | POA: Diagnosis not present

## 2013-07-31 DIAGNOSIS — N2581 Secondary hyperparathyroidism of renal origin: Secondary | ICD-10-CM | POA: Diagnosis not present

## 2013-08-02 DIAGNOSIS — D631 Anemia in chronic kidney disease: Secondary | ICD-10-CM | POA: Diagnosis not present

## 2013-08-02 DIAGNOSIS — N039 Chronic nephritic syndrome with unspecified morphologic changes: Secondary | ICD-10-CM | POA: Diagnosis not present

## 2013-08-02 DIAGNOSIS — N2581 Secondary hyperparathyroidism of renal origin: Secondary | ICD-10-CM | POA: Diagnosis not present

## 2013-08-02 DIAGNOSIS — N186 End stage renal disease: Secondary | ICD-10-CM | POA: Diagnosis not present

## 2013-08-04 DIAGNOSIS — N039 Chronic nephritic syndrome with unspecified morphologic changes: Secondary | ICD-10-CM | POA: Diagnosis not present

## 2013-08-04 DIAGNOSIS — D631 Anemia in chronic kidney disease: Secondary | ICD-10-CM | POA: Diagnosis not present

## 2013-08-04 DIAGNOSIS — N2581 Secondary hyperparathyroidism of renal origin: Secondary | ICD-10-CM | POA: Diagnosis not present

## 2013-08-04 DIAGNOSIS — N186 End stage renal disease: Secondary | ICD-10-CM | POA: Diagnosis not present

## 2013-08-07 DIAGNOSIS — N2581 Secondary hyperparathyroidism of renal origin: Secondary | ICD-10-CM | POA: Diagnosis not present

## 2013-08-07 DIAGNOSIS — N039 Chronic nephritic syndrome with unspecified morphologic changes: Secondary | ICD-10-CM | POA: Diagnosis not present

## 2013-08-07 DIAGNOSIS — N186 End stage renal disease: Secondary | ICD-10-CM | POA: Diagnosis not present

## 2013-08-07 DIAGNOSIS — D631 Anemia in chronic kidney disease: Secondary | ICD-10-CM | POA: Diagnosis not present

## 2013-08-09 DIAGNOSIS — N2581 Secondary hyperparathyroidism of renal origin: Secondary | ICD-10-CM | POA: Diagnosis not present

## 2013-08-09 DIAGNOSIS — N039 Chronic nephritic syndrome with unspecified morphologic changes: Secondary | ICD-10-CM | POA: Diagnosis not present

## 2013-08-09 DIAGNOSIS — N186 End stage renal disease: Secondary | ICD-10-CM | POA: Diagnosis not present

## 2013-08-09 DIAGNOSIS — D631 Anemia in chronic kidney disease: Secondary | ICD-10-CM | POA: Diagnosis not present

## 2013-08-11 DIAGNOSIS — N186 End stage renal disease: Secondary | ICD-10-CM | POA: Diagnosis not present

## 2013-08-11 DIAGNOSIS — N039 Chronic nephritic syndrome with unspecified morphologic changes: Secondary | ICD-10-CM | POA: Diagnosis not present

## 2013-08-11 DIAGNOSIS — N2581 Secondary hyperparathyroidism of renal origin: Secondary | ICD-10-CM | POA: Diagnosis not present

## 2013-08-11 DIAGNOSIS — D631 Anemia in chronic kidney disease: Secondary | ICD-10-CM | POA: Diagnosis not present

## 2013-08-14 DIAGNOSIS — N039 Chronic nephritic syndrome with unspecified morphologic changes: Secondary | ICD-10-CM | POA: Diagnosis not present

## 2013-08-14 DIAGNOSIS — N186 End stage renal disease: Secondary | ICD-10-CM | POA: Diagnosis not present

## 2013-08-14 DIAGNOSIS — D631 Anemia in chronic kidney disease: Secondary | ICD-10-CM | POA: Diagnosis not present

## 2013-08-14 DIAGNOSIS — N2581 Secondary hyperparathyroidism of renal origin: Secondary | ICD-10-CM | POA: Diagnosis not present

## 2013-08-16 DIAGNOSIS — N2581 Secondary hyperparathyroidism of renal origin: Secondary | ICD-10-CM | POA: Diagnosis not present

## 2013-08-16 DIAGNOSIS — D631 Anemia in chronic kidney disease: Secondary | ICD-10-CM | POA: Diagnosis not present

## 2013-08-16 DIAGNOSIS — N039 Chronic nephritic syndrome with unspecified morphologic changes: Secondary | ICD-10-CM | POA: Diagnosis not present

## 2013-08-16 DIAGNOSIS — N186 End stage renal disease: Secondary | ICD-10-CM | POA: Diagnosis not present

## 2013-08-18 DIAGNOSIS — N186 End stage renal disease: Secondary | ICD-10-CM | POA: Diagnosis not present

## 2013-08-18 DIAGNOSIS — D631 Anemia in chronic kidney disease: Secondary | ICD-10-CM | POA: Diagnosis not present

## 2013-08-18 DIAGNOSIS — N039 Chronic nephritic syndrome with unspecified morphologic changes: Secondary | ICD-10-CM | POA: Diagnosis not present

## 2013-08-18 DIAGNOSIS — N2581 Secondary hyperparathyroidism of renal origin: Secondary | ICD-10-CM | POA: Diagnosis not present

## 2013-08-20 DIAGNOSIS — N186 End stage renal disease: Secondary | ICD-10-CM | POA: Diagnosis not present

## 2013-08-21 DIAGNOSIS — N039 Chronic nephritic syndrome with unspecified morphologic changes: Secondary | ICD-10-CM | POA: Diagnosis not present

## 2013-08-21 DIAGNOSIS — N2581 Secondary hyperparathyroidism of renal origin: Secondary | ICD-10-CM | POA: Diagnosis not present

## 2013-08-21 DIAGNOSIS — N186 End stage renal disease: Secondary | ICD-10-CM | POA: Diagnosis not present

## 2013-08-21 DIAGNOSIS — D509 Iron deficiency anemia, unspecified: Secondary | ICD-10-CM | POA: Diagnosis not present

## 2013-08-21 DIAGNOSIS — D631 Anemia in chronic kidney disease: Secondary | ICD-10-CM | POA: Diagnosis not present

## 2013-08-22 DIAGNOSIS — I10 Essential (primary) hypertension: Secondary | ICD-10-CM | POA: Diagnosis not present

## 2013-08-22 DIAGNOSIS — IMO0001 Reserved for inherently not codable concepts without codable children: Secondary | ICD-10-CM | POA: Diagnosis not present

## 2013-08-23 DIAGNOSIS — E119 Type 2 diabetes mellitus without complications: Secondary | ICD-10-CM | POA: Diagnosis not present

## 2013-09-19 DIAGNOSIS — N186 End stage renal disease: Secondary | ICD-10-CM | POA: Diagnosis not present

## 2013-09-22 DIAGNOSIS — N186 End stage renal disease: Secondary | ICD-10-CM | POA: Diagnosis not present

## 2013-09-22 DIAGNOSIS — D631 Anemia in chronic kidney disease: Secondary | ICD-10-CM | POA: Diagnosis not present

## 2013-09-22 DIAGNOSIS — E119 Type 2 diabetes mellitus without complications: Secondary | ICD-10-CM | POA: Diagnosis not present

## 2013-09-22 DIAGNOSIS — D509 Iron deficiency anemia, unspecified: Secondary | ICD-10-CM | POA: Diagnosis not present

## 2013-09-22 DIAGNOSIS — N039 Chronic nephritic syndrome with unspecified morphologic changes: Secondary | ICD-10-CM | POA: Diagnosis not present

## 2013-09-22 DIAGNOSIS — N2581 Secondary hyperparathyroidism of renal origin: Secondary | ICD-10-CM | POA: Diagnosis not present

## 2013-09-25 DIAGNOSIS — N039 Chronic nephritic syndrome with unspecified morphologic changes: Secondary | ICD-10-CM | POA: Diagnosis not present

## 2013-09-25 DIAGNOSIS — N2581 Secondary hyperparathyroidism of renal origin: Secondary | ICD-10-CM | POA: Diagnosis not present

## 2013-09-25 DIAGNOSIS — D631 Anemia in chronic kidney disease: Secondary | ICD-10-CM | POA: Diagnosis not present

## 2013-09-25 DIAGNOSIS — N186 End stage renal disease: Secondary | ICD-10-CM | POA: Diagnosis not present

## 2013-09-25 DIAGNOSIS — D509 Iron deficiency anemia, unspecified: Secondary | ICD-10-CM | POA: Diagnosis not present

## 2013-09-27 DIAGNOSIS — N2581 Secondary hyperparathyroidism of renal origin: Secondary | ICD-10-CM | POA: Diagnosis not present

## 2013-09-27 DIAGNOSIS — D509 Iron deficiency anemia, unspecified: Secondary | ICD-10-CM | POA: Diagnosis not present

## 2013-09-27 DIAGNOSIS — N186 End stage renal disease: Secondary | ICD-10-CM | POA: Diagnosis not present

## 2013-09-27 DIAGNOSIS — D631 Anemia in chronic kidney disease: Secondary | ICD-10-CM | POA: Diagnosis not present

## 2013-09-27 DIAGNOSIS — N039 Chronic nephritic syndrome with unspecified morphologic changes: Secondary | ICD-10-CM | POA: Diagnosis not present

## 2013-09-29 DIAGNOSIS — N186 End stage renal disease: Secondary | ICD-10-CM | POA: Diagnosis not present

## 2013-09-29 DIAGNOSIS — D509 Iron deficiency anemia, unspecified: Secondary | ICD-10-CM | POA: Diagnosis not present

## 2013-09-29 DIAGNOSIS — D631 Anemia in chronic kidney disease: Secondary | ICD-10-CM | POA: Diagnosis not present

## 2013-09-29 DIAGNOSIS — N2581 Secondary hyperparathyroidism of renal origin: Secondary | ICD-10-CM | POA: Diagnosis not present

## 2013-09-29 DIAGNOSIS — N039 Chronic nephritic syndrome with unspecified morphologic changes: Secondary | ICD-10-CM | POA: Diagnosis not present

## 2013-10-02 DIAGNOSIS — N039 Chronic nephritic syndrome with unspecified morphologic changes: Secondary | ICD-10-CM | POA: Diagnosis not present

## 2013-10-02 DIAGNOSIS — D631 Anemia in chronic kidney disease: Secondary | ICD-10-CM | POA: Diagnosis not present

## 2013-10-02 DIAGNOSIS — D509 Iron deficiency anemia, unspecified: Secondary | ICD-10-CM | POA: Diagnosis not present

## 2013-10-02 DIAGNOSIS — N186 End stage renal disease: Secondary | ICD-10-CM | POA: Diagnosis not present

## 2013-10-02 DIAGNOSIS — N2581 Secondary hyperparathyroidism of renal origin: Secondary | ICD-10-CM | POA: Diagnosis not present

## 2013-10-04 DIAGNOSIS — D509 Iron deficiency anemia, unspecified: Secondary | ICD-10-CM | POA: Diagnosis not present

## 2013-10-04 DIAGNOSIS — N186 End stage renal disease: Secondary | ICD-10-CM | POA: Diagnosis not present

## 2013-10-04 DIAGNOSIS — N2581 Secondary hyperparathyroidism of renal origin: Secondary | ICD-10-CM | POA: Diagnosis not present

## 2013-10-04 DIAGNOSIS — D631 Anemia in chronic kidney disease: Secondary | ICD-10-CM | POA: Diagnosis not present

## 2013-10-04 DIAGNOSIS — N039 Chronic nephritic syndrome with unspecified morphologic changes: Secondary | ICD-10-CM | POA: Diagnosis not present

## 2013-10-05 ENCOUNTER — Ambulatory Visit (INDEPENDENT_AMBULATORY_CARE_PROVIDER_SITE_OTHER): Payer: Medicare Other | Admitting: Ophthalmology

## 2013-10-05 DIAGNOSIS — H2510 Age-related nuclear cataract, unspecified eye: Secondary | ICD-10-CM

## 2013-10-05 DIAGNOSIS — E1039 Type 1 diabetes mellitus with other diabetic ophthalmic complication: Secondary | ICD-10-CM | POA: Diagnosis not present

## 2013-10-05 DIAGNOSIS — E1065 Type 1 diabetes mellitus with hyperglycemia: Secondary | ICD-10-CM

## 2013-10-05 DIAGNOSIS — E11359 Type 2 diabetes mellitus with proliferative diabetic retinopathy without macular edema: Secondary | ICD-10-CM

## 2013-10-05 DIAGNOSIS — H43819 Vitreous degeneration, unspecified eye: Secondary | ICD-10-CM

## 2013-10-06 DIAGNOSIS — D631 Anemia in chronic kidney disease: Secondary | ICD-10-CM | POA: Diagnosis not present

## 2013-10-06 DIAGNOSIS — N186 End stage renal disease: Secondary | ICD-10-CM | POA: Diagnosis not present

## 2013-10-06 DIAGNOSIS — N2581 Secondary hyperparathyroidism of renal origin: Secondary | ICD-10-CM | POA: Diagnosis not present

## 2013-10-06 DIAGNOSIS — N039 Chronic nephritic syndrome with unspecified morphologic changes: Secondary | ICD-10-CM | POA: Diagnosis not present

## 2013-10-06 DIAGNOSIS — D509 Iron deficiency anemia, unspecified: Secondary | ICD-10-CM | POA: Diagnosis not present

## 2013-10-09 DIAGNOSIS — N039 Chronic nephritic syndrome with unspecified morphologic changes: Secondary | ICD-10-CM | POA: Diagnosis not present

## 2013-10-09 DIAGNOSIS — D509 Iron deficiency anemia, unspecified: Secondary | ICD-10-CM | POA: Diagnosis not present

## 2013-10-09 DIAGNOSIS — N2581 Secondary hyperparathyroidism of renal origin: Secondary | ICD-10-CM | POA: Diagnosis not present

## 2013-10-09 DIAGNOSIS — D631 Anemia in chronic kidney disease: Secondary | ICD-10-CM | POA: Diagnosis not present

## 2013-10-09 DIAGNOSIS — N186 End stage renal disease: Secondary | ICD-10-CM | POA: Diagnosis not present

## 2013-10-11 DIAGNOSIS — D509 Iron deficiency anemia, unspecified: Secondary | ICD-10-CM | POA: Diagnosis not present

## 2013-10-11 DIAGNOSIS — N2581 Secondary hyperparathyroidism of renal origin: Secondary | ICD-10-CM | POA: Diagnosis not present

## 2013-10-11 DIAGNOSIS — D631 Anemia in chronic kidney disease: Secondary | ICD-10-CM | POA: Diagnosis not present

## 2013-10-11 DIAGNOSIS — N186 End stage renal disease: Secondary | ICD-10-CM | POA: Diagnosis not present

## 2013-10-11 DIAGNOSIS — N039 Chronic nephritic syndrome with unspecified morphologic changes: Secondary | ICD-10-CM | POA: Diagnosis not present

## 2013-10-12 DIAGNOSIS — H40019 Open angle with borderline findings, low risk, unspecified eye: Secondary | ICD-10-CM | POA: Diagnosis not present

## 2013-10-13 DIAGNOSIS — N186 End stage renal disease: Secondary | ICD-10-CM | POA: Diagnosis not present

## 2013-10-13 DIAGNOSIS — D509 Iron deficiency anemia, unspecified: Secondary | ICD-10-CM | POA: Diagnosis not present

## 2013-10-13 DIAGNOSIS — D631 Anemia in chronic kidney disease: Secondary | ICD-10-CM | POA: Diagnosis not present

## 2013-10-13 DIAGNOSIS — N039 Chronic nephritic syndrome with unspecified morphologic changes: Secondary | ICD-10-CM | POA: Diagnosis not present

## 2013-10-13 DIAGNOSIS — N2581 Secondary hyperparathyroidism of renal origin: Secondary | ICD-10-CM | POA: Diagnosis not present

## 2013-10-16 DIAGNOSIS — D509 Iron deficiency anemia, unspecified: Secondary | ICD-10-CM | POA: Diagnosis not present

## 2013-10-16 DIAGNOSIS — D631 Anemia in chronic kidney disease: Secondary | ICD-10-CM | POA: Diagnosis not present

## 2013-10-16 DIAGNOSIS — N2581 Secondary hyperparathyroidism of renal origin: Secondary | ICD-10-CM | POA: Diagnosis not present

## 2013-10-16 DIAGNOSIS — N186 End stage renal disease: Secondary | ICD-10-CM | POA: Diagnosis not present

## 2013-10-16 DIAGNOSIS — N039 Chronic nephritic syndrome with unspecified morphologic changes: Secondary | ICD-10-CM | POA: Diagnosis not present

## 2013-10-18 DIAGNOSIS — N039 Chronic nephritic syndrome with unspecified morphologic changes: Secondary | ICD-10-CM | POA: Diagnosis not present

## 2013-10-18 DIAGNOSIS — N2581 Secondary hyperparathyroidism of renal origin: Secondary | ICD-10-CM | POA: Diagnosis not present

## 2013-10-18 DIAGNOSIS — N186 End stage renal disease: Secondary | ICD-10-CM | POA: Diagnosis not present

## 2013-10-18 DIAGNOSIS — D631 Anemia in chronic kidney disease: Secondary | ICD-10-CM | POA: Diagnosis not present

## 2013-10-18 DIAGNOSIS — D509 Iron deficiency anemia, unspecified: Secondary | ICD-10-CM | POA: Diagnosis not present

## 2013-10-20 DIAGNOSIS — N186 End stage renal disease: Secondary | ICD-10-CM | POA: Diagnosis not present

## 2013-10-20 DIAGNOSIS — D631 Anemia in chronic kidney disease: Secondary | ICD-10-CM | POA: Diagnosis not present

## 2013-10-20 DIAGNOSIS — N2581 Secondary hyperparathyroidism of renal origin: Secondary | ICD-10-CM | POA: Diagnosis not present

## 2013-10-20 DIAGNOSIS — Z992 Dependence on renal dialysis: Secondary | ICD-10-CM | POA: Diagnosis not present

## 2013-10-20 DIAGNOSIS — D509 Iron deficiency anemia, unspecified: Secondary | ICD-10-CM | POA: Diagnosis not present

## 2013-10-20 DIAGNOSIS — N039 Chronic nephritic syndrome with unspecified morphologic changes: Secondary | ICD-10-CM | POA: Diagnosis not present

## 2013-10-23 DIAGNOSIS — D509 Iron deficiency anemia, unspecified: Secondary | ICD-10-CM | POA: Diagnosis not present

## 2013-10-23 DIAGNOSIS — N039 Chronic nephritic syndrome with unspecified morphologic changes: Secondary | ICD-10-CM | POA: Diagnosis not present

## 2013-10-23 DIAGNOSIS — N186 End stage renal disease: Secondary | ICD-10-CM | POA: Diagnosis not present

## 2013-10-23 DIAGNOSIS — N2581 Secondary hyperparathyroidism of renal origin: Secondary | ICD-10-CM | POA: Diagnosis not present

## 2013-10-23 DIAGNOSIS — D631 Anemia in chronic kidney disease: Secondary | ICD-10-CM | POA: Diagnosis not present

## 2013-10-25 DIAGNOSIS — D631 Anemia in chronic kidney disease: Secondary | ICD-10-CM | POA: Diagnosis not present

## 2013-10-25 DIAGNOSIS — N2581 Secondary hyperparathyroidism of renal origin: Secondary | ICD-10-CM | POA: Diagnosis not present

## 2013-10-25 DIAGNOSIS — D509 Iron deficiency anemia, unspecified: Secondary | ICD-10-CM | POA: Diagnosis not present

## 2013-10-25 DIAGNOSIS — E119 Type 2 diabetes mellitus without complications: Secondary | ICD-10-CM | POA: Diagnosis not present

## 2013-10-25 DIAGNOSIS — N039 Chronic nephritic syndrome with unspecified morphologic changes: Secondary | ICD-10-CM | POA: Diagnosis not present

## 2013-10-25 DIAGNOSIS — N186 End stage renal disease: Secondary | ICD-10-CM | POA: Diagnosis not present

## 2013-10-27 DIAGNOSIS — N2581 Secondary hyperparathyroidism of renal origin: Secondary | ICD-10-CM | POA: Diagnosis not present

## 2013-10-27 DIAGNOSIS — D631 Anemia in chronic kidney disease: Secondary | ICD-10-CM | POA: Diagnosis not present

## 2013-10-27 DIAGNOSIS — N039 Chronic nephritic syndrome with unspecified morphologic changes: Secondary | ICD-10-CM | POA: Diagnosis not present

## 2013-10-27 DIAGNOSIS — D509 Iron deficiency anemia, unspecified: Secondary | ICD-10-CM | POA: Diagnosis not present

## 2013-10-27 DIAGNOSIS — N186 End stage renal disease: Secondary | ICD-10-CM | POA: Diagnosis not present

## 2013-10-30 DIAGNOSIS — N2581 Secondary hyperparathyroidism of renal origin: Secondary | ICD-10-CM | POA: Diagnosis not present

## 2013-10-30 DIAGNOSIS — N186 End stage renal disease: Secondary | ICD-10-CM | POA: Diagnosis not present

## 2013-10-30 DIAGNOSIS — N039 Chronic nephritic syndrome with unspecified morphologic changes: Secondary | ICD-10-CM | POA: Diagnosis not present

## 2013-10-30 DIAGNOSIS — D509 Iron deficiency anemia, unspecified: Secondary | ICD-10-CM | POA: Diagnosis not present

## 2013-10-30 DIAGNOSIS — D631 Anemia in chronic kidney disease: Secondary | ICD-10-CM | POA: Diagnosis not present

## 2013-11-01 DIAGNOSIS — N2581 Secondary hyperparathyroidism of renal origin: Secondary | ICD-10-CM | POA: Diagnosis not present

## 2013-11-01 DIAGNOSIS — N039 Chronic nephritic syndrome with unspecified morphologic changes: Secondary | ICD-10-CM | POA: Diagnosis not present

## 2013-11-01 DIAGNOSIS — D509 Iron deficiency anemia, unspecified: Secondary | ICD-10-CM | POA: Diagnosis not present

## 2013-11-01 DIAGNOSIS — N186 End stage renal disease: Secondary | ICD-10-CM | POA: Diagnosis not present

## 2013-11-01 DIAGNOSIS — D631 Anemia in chronic kidney disease: Secondary | ICD-10-CM | POA: Diagnosis not present

## 2013-11-03 DIAGNOSIS — D509 Iron deficiency anemia, unspecified: Secondary | ICD-10-CM | POA: Diagnosis not present

## 2013-11-03 DIAGNOSIS — N186 End stage renal disease: Secondary | ICD-10-CM | POA: Diagnosis not present

## 2013-11-03 DIAGNOSIS — N2581 Secondary hyperparathyroidism of renal origin: Secondary | ICD-10-CM | POA: Diagnosis not present

## 2013-11-03 DIAGNOSIS — D631 Anemia in chronic kidney disease: Secondary | ICD-10-CM | POA: Diagnosis not present

## 2013-11-03 DIAGNOSIS — N039 Chronic nephritic syndrome with unspecified morphologic changes: Secondary | ICD-10-CM | POA: Diagnosis not present

## 2013-11-06 DIAGNOSIS — D631 Anemia in chronic kidney disease: Secondary | ICD-10-CM | POA: Diagnosis not present

## 2013-11-06 DIAGNOSIS — N186 End stage renal disease: Secondary | ICD-10-CM | POA: Diagnosis not present

## 2013-11-06 DIAGNOSIS — N039 Chronic nephritic syndrome with unspecified morphologic changes: Secondary | ICD-10-CM | POA: Diagnosis not present

## 2013-11-06 DIAGNOSIS — D509 Iron deficiency anemia, unspecified: Secondary | ICD-10-CM | POA: Diagnosis not present

## 2013-11-06 DIAGNOSIS — N2581 Secondary hyperparathyroidism of renal origin: Secondary | ICD-10-CM | POA: Diagnosis not present

## 2013-11-08 DIAGNOSIS — D509 Iron deficiency anemia, unspecified: Secondary | ICD-10-CM | POA: Diagnosis not present

## 2013-11-08 DIAGNOSIS — N186 End stage renal disease: Secondary | ICD-10-CM | POA: Diagnosis not present

## 2013-11-08 DIAGNOSIS — N039 Chronic nephritic syndrome with unspecified morphologic changes: Secondary | ICD-10-CM | POA: Diagnosis not present

## 2013-11-08 DIAGNOSIS — N2581 Secondary hyperparathyroidism of renal origin: Secondary | ICD-10-CM | POA: Diagnosis not present

## 2013-11-08 DIAGNOSIS — D631 Anemia in chronic kidney disease: Secondary | ICD-10-CM | POA: Diagnosis not present

## 2013-11-10 DIAGNOSIS — N2581 Secondary hyperparathyroidism of renal origin: Secondary | ICD-10-CM | POA: Diagnosis not present

## 2013-11-10 DIAGNOSIS — N186 End stage renal disease: Secondary | ICD-10-CM | POA: Diagnosis not present

## 2013-11-10 DIAGNOSIS — N039 Chronic nephritic syndrome with unspecified morphologic changes: Secondary | ICD-10-CM | POA: Diagnosis not present

## 2013-11-10 DIAGNOSIS — D631 Anemia in chronic kidney disease: Secondary | ICD-10-CM | POA: Diagnosis not present

## 2013-11-10 DIAGNOSIS — D509 Iron deficiency anemia, unspecified: Secondary | ICD-10-CM | POA: Diagnosis not present

## 2013-11-13 DIAGNOSIS — N186 End stage renal disease: Secondary | ICD-10-CM | POA: Diagnosis not present

## 2013-11-13 DIAGNOSIS — D631 Anemia in chronic kidney disease: Secondary | ICD-10-CM | POA: Diagnosis not present

## 2013-11-13 DIAGNOSIS — N039 Chronic nephritic syndrome with unspecified morphologic changes: Secondary | ICD-10-CM | POA: Diagnosis not present

## 2013-11-13 DIAGNOSIS — N2581 Secondary hyperparathyroidism of renal origin: Secondary | ICD-10-CM | POA: Diagnosis not present

## 2013-11-13 DIAGNOSIS — D509 Iron deficiency anemia, unspecified: Secondary | ICD-10-CM | POA: Diagnosis not present

## 2013-11-15 DIAGNOSIS — D631 Anemia in chronic kidney disease: Secondary | ICD-10-CM | POA: Diagnosis not present

## 2013-11-15 DIAGNOSIS — N039 Chronic nephritic syndrome with unspecified morphologic changes: Secondary | ICD-10-CM | POA: Diagnosis not present

## 2013-11-15 DIAGNOSIS — D509 Iron deficiency anemia, unspecified: Secondary | ICD-10-CM | POA: Diagnosis not present

## 2013-11-15 DIAGNOSIS — N186 End stage renal disease: Secondary | ICD-10-CM | POA: Diagnosis not present

## 2013-11-15 DIAGNOSIS — N2581 Secondary hyperparathyroidism of renal origin: Secondary | ICD-10-CM | POA: Diagnosis not present

## 2013-11-17 DIAGNOSIS — D631 Anemia in chronic kidney disease: Secondary | ICD-10-CM | POA: Diagnosis not present

## 2013-11-17 DIAGNOSIS — N039 Chronic nephritic syndrome with unspecified morphologic changes: Secondary | ICD-10-CM | POA: Diagnosis not present

## 2013-11-17 DIAGNOSIS — N186 End stage renal disease: Secondary | ICD-10-CM | POA: Diagnosis not present

## 2013-11-17 DIAGNOSIS — D509 Iron deficiency anemia, unspecified: Secondary | ICD-10-CM | POA: Diagnosis not present

## 2013-11-17 DIAGNOSIS — N2581 Secondary hyperparathyroidism of renal origin: Secondary | ICD-10-CM | POA: Diagnosis not present

## 2013-11-20 DIAGNOSIS — N039 Chronic nephritic syndrome with unspecified morphologic changes: Secondary | ICD-10-CM | POA: Diagnosis not present

## 2013-11-20 DIAGNOSIS — D509 Iron deficiency anemia, unspecified: Secondary | ICD-10-CM | POA: Diagnosis not present

## 2013-11-20 DIAGNOSIS — N2581 Secondary hyperparathyroidism of renal origin: Secondary | ICD-10-CM | POA: Diagnosis not present

## 2013-11-20 DIAGNOSIS — Z992 Dependence on renal dialysis: Secondary | ICD-10-CM | POA: Diagnosis not present

## 2013-11-20 DIAGNOSIS — N186 End stage renal disease: Secondary | ICD-10-CM | POA: Diagnosis not present

## 2013-11-20 DIAGNOSIS — D631 Anemia in chronic kidney disease: Secondary | ICD-10-CM | POA: Diagnosis not present

## 2013-11-22 DIAGNOSIS — E119 Type 2 diabetes mellitus without complications: Secondary | ICD-10-CM | POA: Diagnosis not present

## 2013-12-04 ENCOUNTER — Encounter (HOSPITAL_COMMUNITY): Payer: Self-pay | Admitting: Emergency Medicine

## 2013-12-04 ENCOUNTER — Emergency Department (HOSPITAL_COMMUNITY)
Admission: EM | Admit: 2013-12-04 | Discharge: 2013-12-04 | Disposition: A | Discharge status: Home / Self Care | Payer: Medicare Other | Attending: Emergency Medicine | Admitting: Emergency Medicine

## 2013-12-04 DIAGNOSIS — Y92410 Unspecified street and highway as the place of occurrence of the external cause: Secondary | ICD-10-CM | POA: Insufficient documentation

## 2013-12-04 DIAGNOSIS — N189 Chronic kidney disease, unspecified: Secondary | ICD-10-CM | POA: Insufficient documentation

## 2013-12-04 DIAGNOSIS — Z7982 Long term (current) use of aspirin: Secondary | ICD-10-CM | POA: Diagnosis not present

## 2013-12-04 DIAGNOSIS — S4980XA Other specified injuries of shoulder and upper arm, unspecified arm, initial encounter: Secondary | ICD-10-CM | POA: Insufficient documentation

## 2013-12-04 DIAGNOSIS — S0993XA Unspecified injury of face, initial encounter: Secondary | ICD-10-CM | POA: Insufficient documentation

## 2013-12-04 DIAGNOSIS — S199XXA Unspecified injury of neck, initial encounter: Secondary | ICD-10-CM | POA: Insufficient documentation

## 2013-12-04 DIAGNOSIS — R0781 Pleurodynia: Secondary | ICD-10-CM

## 2013-12-04 DIAGNOSIS — Z79899 Other long term (current) drug therapy: Secondary | ICD-10-CM | POA: Diagnosis not present

## 2013-12-04 DIAGNOSIS — S46909A Unspecified injury of unspecified muscle, fascia and tendon at shoulder and upper arm level, unspecified arm, initial encounter: Secondary | ICD-10-CM | POA: Diagnosis not present

## 2013-12-04 DIAGNOSIS — Z8669 Personal history of other diseases of the nervous system and sense organs: Secondary | ICD-10-CM | POA: Diagnosis not present

## 2013-12-04 DIAGNOSIS — Y9389 Activity, other specified: Secondary | ICD-10-CM | POA: Diagnosis not present

## 2013-12-04 DIAGNOSIS — I129 Hypertensive chronic kidney disease with stage 1 through stage 4 chronic kidney disease, or unspecified chronic kidney disease: Secondary | ICD-10-CM | POA: Insufficient documentation

## 2013-12-04 DIAGNOSIS — E119 Type 2 diabetes mellitus without complications: Secondary | ICD-10-CM | POA: Diagnosis not present

## 2013-12-04 DIAGNOSIS — I509 Heart failure, unspecified: Secondary | ICD-10-CM | POA: Diagnosis not present

## 2013-12-04 DIAGNOSIS — Z794 Long term (current) use of insulin: Secondary | ICD-10-CM | POA: Insufficient documentation

## 2013-12-04 DIAGNOSIS — R0789 Other chest pain: Secondary | ICD-10-CM

## 2013-12-04 DIAGNOSIS — M25512 Pain in left shoulder: Secondary | ICD-10-CM

## 2013-12-04 DIAGNOSIS — S298XXA Other specified injuries of thorax, initial encounter: Secondary | ICD-10-CM | POA: Diagnosis not present

## 2013-12-04 DIAGNOSIS — M542 Cervicalgia: Secondary | ICD-10-CM

## 2013-12-04 MED ORDER — DIAZEPAM 2 MG PO TABS
2.0000 mg | ORAL_TABLET | Freq: Once | ORAL | Status: AC
  Administered 2013-12-04: 2 mg via ORAL
  Filled 2013-12-04: qty 1

## 2013-12-04 MED ORDER — HYDROCODONE-ACETAMINOPHEN 5-325 MG PO TABS: 1.0000 | ORAL_TABLET | Freq: Four times a day (QID) | ORAL | Status: DC | PRN

## 2013-12-04 MED ORDER — DIAZEPAM 2 MG PO TABS: 2.0000 mg | ORAL_TABLET | Freq: Four times a day (QID) | ORAL | Status: DC | PRN

## 2013-12-04 NOTE — ED Provider Notes (Signed)
I saw and evaluated the patient, reviewed the resident's note and I agree with the findings and plan.   EKG Interpretation None       Leota Jacobsen, MD 12/04/13 1622
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
## 2013-12-04 NOTE — Discharge Instructions (Signed)
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
## 2013-12-20 DIAGNOSIS — Z992 Dependence on renal dialysis: Secondary | ICD-10-CM | POA: Diagnosis not present

## 2013-12-20 DIAGNOSIS — N186 End stage renal disease: Secondary | ICD-10-CM | POA: Diagnosis not present

## 2013-12-20 DIAGNOSIS — D631 Anemia in chronic kidney disease: Secondary | ICD-10-CM | POA: Diagnosis not present

## 2013-12-20 DIAGNOSIS — D509 Iron deficiency anemia, unspecified: Secondary | ICD-10-CM | POA: Diagnosis not present

## 2013-12-20 DIAGNOSIS — N2581 Secondary hyperparathyroidism of renal origin: Secondary | ICD-10-CM | POA: Diagnosis not present

## 2013-12-22 DIAGNOSIS — D631 Anemia in chronic kidney disease: Secondary | ICD-10-CM | POA: Diagnosis not present

## 2013-12-22 DIAGNOSIS — N2581 Secondary hyperparathyroidism of renal origin: Secondary | ICD-10-CM | POA: Diagnosis not present

## 2013-12-22 DIAGNOSIS — N186 End stage renal disease: Secondary | ICD-10-CM | POA: Diagnosis not present

## 2013-12-22 DIAGNOSIS — D509 Iron deficiency anemia, unspecified: Secondary | ICD-10-CM | POA: Diagnosis not present

## 2013-12-23 ENCOUNTER — Other Ambulatory Visit: Payer: Self-pay | Admitting: Interventional Cardiology

## 2013-12-25 DIAGNOSIS — N186 End stage renal disease: Secondary | ICD-10-CM | POA: Diagnosis not present

## 2013-12-25 DIAGNOSIS — D631 Anemia in chronic kidney disease: Secondary | ICD-10-CM | POA: Diagnosis not present

## 2013-12-25 DIAGNOSIS — D509 Iron deficiency anemia, unspecified: Secondary | ICD-10-CM | POA: Diagnosis not present

## 2013-12-25 DIAGNOSIS — N2581 Secondary hyperparathyroidism of renal origin: Secondary | ICD-10-CM | POA: Diagnosis not present

## 2013-12-27 DIAGNOSIS — D631 Anemia in chronic kidney disease: Secondary | ICD-10-CM | POA: Diagnosis not present

## 2013-12-27 DIAGNOSIS — N2581 Secondary hyperparathyroidism of renal origin: Secondary | ICD-10-CM | POA: Diagnosis not present

## 2013-12-27 DIAGNOSIS — N186 End stage renal disease: Secondary | ICD-10-CM | POA: Diagnosis not present

## 2013-12-27 DIAGNOSIS — D509 Iron deficiency anemia, unspecified: Secondary | ICD-10-CM | POA: Diagnosis not present

## 2013-12-27 DIAGNOSIS — E119 Type 2 diabetes mellitus without complications: Secondary | ICD-10-CM | POA: Diagnosis not present

## 2013-12-29 DIAGNOSIS — D631 Anemia in chronic kidney disease: Secondary | ICD-10-CM | POA: Diagnosis not present

## 2013-12-29 DIAGNOSIS — N186 End stage renal disease: Secondary | ICD-10-CM | POA: Diagnosis not present

## 2013-12-29 DIAGNOSIS — D509 Iron deficiency anemia, unspecified: Secondary | ICD-10-CM | POA: Diagnosis not present

## 2013-12-29 DIAGNOSIS — N2581 Secondary hyperparathyroidism of renal origin: Secondary | ICD-10-CM | POA: Diagnosis not present

## 2014-01-01 DIAGNOSIS — D631 Anemia in chronic kidney disease: Secondary | ICD-10-CM | POA: Diagnosis not present

## 2014-01-01 DIAGNOSIS — N2581 Secondary hyperparathyroidism of renal origin: Secondary | ICD-10-CM | POA: Diagnosis not present

## 2014-01-01 DIAGNOSIS — D509 Iron deficiency anemia, unspecified: Secondary | ICD-10-CM | POA: Diagnosis not present

## 2014-01-01 DIAGNOSIS — N186 End stage renal disease: Secondary | ICD-10-CM | POA: Diagnosis not present

## 2014-01-03 DIAGNOSIS — D509 Iron deficiency anemia, unspecified: Secondary | ICD-10-CM | POA: Diagnosis not present

## 2014-01-03 DIAGNOSIS — N186 End stage renal disease: Secondary | ICD-10-CM | POA: Diagnosis not present

## 2014-01-03 DIAGNOSIS — N2581 Secondary hyperparathyroidism of renal origin: Secondary | ICD-10-CM | POA: Diagnosis not present

## 2014-01-03 DIAGNOSIS — D631 Anemia in chronic kidney disease: Secondary | ICD-10-CM | POA: Diagnosis not present

## 2014-01-05 DIAGNOSIS — D509 Iron deficiency anemia, unspecified: Secondary | ICD-10-CM | POA: Diagnosis not present

## 2014-01-05 DIAGNOSIS — N186 End stage renal disease: Secondary | ICD-10-CM | POA: Diagnosis not present

## 2014-01-05 DIAGNOSIS — N2581 Secondary hyperparathyroidism of renal origin: Secondary | ICD-10-CM | POA: Diagnosis not present

## 2014-01-05 DIAGNOSIS — D631 Anemia in chronic kidney disease: Secondary | ICD-10-CM | POA: Diagnosis not present

## 2014-01-08 DIAGNOSIS — D631 Anemia in chronic kidney disease: Secondary | ICD-10-CM | POA: Diagnosis not present

## 2014-01-08 DIAGNOSIS — N186 End stage renal disease: Secondary | ICD-10-CM | POA: Diagnosis not present

## 2014-01-08 DIAGNOSIS — N2581 Secondary hyperparathyroidism of renal origin: Secondary | ICD-10-CM | POA: Diagnosis not present

## 2014-01-08 DIAGNOSIS — D509 Iron deficiency anemia, unspecified: Secondary | ICD-10-CM | POA: Diagnosis not present

## 2014-01-10 DIAGNOSIS — N186 End stage renal disease: Secondary | ICD-10-CM | POA: Diagnosis not present

## 2014-01-10 DIAGNOSIS — D509 Iron deficiency anemia, unspecified: Secondary | ICD-10-CM | POA: Diagnosis not present

## 2014-01-10 DIAGNOSIS — D631 Anemia in chronic kidney disease: Secondary | ICD-10-CM | POA: Diagnosis not present

## 2014-01-10 DIAGNOSIS — N2581 Secondary hyperparathyroidism of renal origin: Secondary | ICD-10-CM | POA: Diagnosis not present

## 2014-01-12 DIAGNOSIS — D509 Iron deficiency anemia, unspecified: Secondary | ICD-10-CM | POA: Diagnosis not present

## 2014-01-12 DIAGNOSIS — N186 End stage renal disease: Secondary | ICD-10-CM | POA: Diagnosis not present

## 2014-01-12 DIAGNOSIS — D631 Anemia in chronic kidney disease: Secondary | ICD-10-CM | POA: Diagnosis not present

## 2014-01-12 DIAGNOSIS — N2581 Secondary hyperparathyroidism of renal origin: Secondary | ICD-10-CM | POA: Diagnosis not present

## 2014-01-14 ENCOUNTER — Other Ambulatory Visit: Payer: Self-pay | Admitting: Interventional Cardiology

## 2014-01-14 DIAGNOSIS — I5032 Chronic diastolic (congestive) heart failure: Secondary | ICD-10-CM | POA: Diagnosis not present

## 2014-01-14 DIAGNOSIS — Z79891 Long term (current) use of opiate analgesic: Secondary | ICD-10-CM | POA: Diagnosis not present

## 2014-01-14 DIAGNOSIS — E780 Pure hypercholesterolemia: Secondary | ICD-10-CM | POA: Diagnosis not present

## 2014-01-14 DIAGNOSIS — E1121 Type 2 diabetes mellitus with diabetic nephropathy: Secondary | ICD-10-CM | POA: Diagnosis not present

## 2014-01-14 DIAGNOSIS — Z79899 Other long term (current) drug therapy: Secondary | ICD-10-CM | POA: Diagnosis not present

## 2014-01-14 DIAGNOSIS — Z23 Encounter for immunization: Secondary | ICD-10-CM | POA: Diagnosis not present

## 2014-01-14 DIAGNOSIS — Z0000 Encounter for general adult medical examination without abnormal findings: Secondary | ICD-10-CM | POA: Diagnosis not present

## 2014-01-15 DIAGNOSIS — D631 Anemia in chronic kidney disease: Secondary | ICD-10-CM | POA: Diagnosis not present

## 2014-01-15 DIAGNOSIS — N186 End stage renal disease: Secondary | ICD-10-CM | POA: Diagnosis not present

## 2014-01-15 DIAGNOSIS — D509 Iron deficiency anemia, unspecified: Secondary | ICD-10-CM | POA: Diagnosis not present

## 2014-01-15 DIAGNOSIS — N2581 Secondary hyperparathyroidism of renal origin: Secondary | ICD-10-CM | POA: Diagnosis not present

## 2014-01-17 DIAGNOSIS — D631 Anemia in chronic kidney disease: Secondary | ICD-10-CM | POA: Diagnosis not present

## 2014-01-17 DIAGNOSIS — D509 Iron deficiency anemia, unspecified: Secondary | ICD-10-CM | POA: Diagnosis not present

## 2014-01-17 DIAGNOSIS — N186 End stage renal disease: Secondary | ICD-10-CM | POA: Diagnosis not present

## 2014-01-17 DIAGNOSIS — N2581 Secondary hyperparathyroidism of renal origin: Secondary | ICD-10-CM | POA: Diagnosis not present

## 2014-01-19 DIAGNOSIS — N186 End stage renal disease: Secondary | ICD-10-CM | POA: Diagnosis not present

## 2014-01-19 DIAGNOSIS — D631 Anemia in chronic kidney disease: Secondary | ICD-10-CM | POA: Diagnosis not present

## 2014-01-19 DIAGNOSIS — D509 Iron deficiency anemia, unspecified: Secondary | ICD-10-CM | POA: Diagnosis not present

## 2014-01-19 DIAGNOSIS — N2581 Secondary hyperparathyroidism of renal origin: Secondary | ICD-10-CM | POA: Diagnosis not present

## 2014-01-20 DIAGNOSIS — Z992 Dependence on renal dialysis: Secondary | ICD-10-CM | POA: Diagnosis not present

## 2014-01-20 DIAGNOSIS — N186 End stage renal disease: Secondary | ICD-10-CM | POA: Diagnosis not present

## 2014-01-22 DIAGNOSIS — D631 Anemia in chronic kidney disease: Secondary | ICD-10-CM | POA: Diagnosis not present

## 2014-01-22 DIAGNOSIS — D509 Iron deficiency anemia, unspecified: Secondary | ICD-10-CM | POA: Diagnosis not present

## 2014-01-22 DIAGNOSIS — N186 End stage renal disease: Secondary | ICD-10-CM | POA: Diagnosis not present

## 2014-01-22 DIAGNOSIS — N2581 Secondary hyperparathyroidism of renal origin: Secondary | ICD-10-CM | POA: Diagnosis not present

## 2014-01-24 DIAGNOSIS — E119 Type 2 diabetes mellitus without complications: Secondary | ICD-10-CM | POA: Diagnosis not present

## 2014-01-24 DIAGNOSIS — D631 Anemia in chronic kidney disease: Secondary | ICD-10-CM | POA: Diagnosis not present

## 2014-01-24 DIAGNOSIS — D509 Iron deficiency anemia, unspecified: Secondary | ICD-10-CM | POA: Diagnosis not present

## 2014-01-24 DIAGNOSIS — N186 End stage renal disease: Secondary | ICD-10-CM | POA: Diagnosis not present

## 2014-01-24 DIAGNOSIS — N2581 Secondary hyperparathyroidism of renal origin: Secondary | ICD-10-CM | POA: Diagnosis not present

## 2014-01-26 DIAGNOSIS — D631 Anemia in chronic kidney disease: Secondary | ICD-10-CM | POA: Diagnosis not present

## 2014-01-26 DIAGNOSIS — N2581 Secondary hyperparathyroidism of renal origin: Secondary | ICD-10-CM | POA: Diagnosis not present

## 2014-01-26 DIAGNOSIS — D509 Iron deficiency anemia, unspecified: Secondary | ICD-10-CM | POA: Diagnosis not present

## 2014-01-26 DIAGNOSIS — N186 End stage renal disease: Secondary | ICD-10-CM | POA: Diagnosis not present

## 2014-01-29 DIAGNOSIS — N2581 Secondary hyperparathyroidism of renal origin: Secondary | ICD-10-CM | POA: Diagnosis not present

## 2014-01-29 DIAGNOSIS — D631 Anemia in chronic kidney disease: Secondary | ICD-10-CM | POA: Diagnosis not present

## 2014-01-29 DIAGNOSIS — D509 Iron deficiency anemia, unspecified: Secondary | ICD-10-CM | POA: Diagnosis not present

## 2014-01-29 DIAGNOSIS — N186 End stage renal disease: Secondary | ICD-10-CM | POA: Diagnosis not present

## 2014-01-31 DIAGNOSIS — N186 End stage renal disease: Secondary | ICD-10-CM | POA: Diagnosis not present

## 2014-01-31 DIAGNOSIS — D631 Anemia in chronic kidney disease: Secondary | ICD-10-CM | POA: Diagnosis not present

## 2014-01-31 DIAGNOSIS — N2581 Secondary hyperparathyroidism of renal origin: Secondary | ICD-10-CM | POA: Diagnosis not present

## 2014-01-31 DIAGNOSIS — D509 Iron deficiency anemia, unspecified: Secondary | ICD-10-CM | POA: Diagnosis not present

## 2014-02-02 DIAGNOSIS — N2581 Secondary hyperparathyroidism of renal origin: Secondary | ICD-10-CM | POA: Diagnosis not present

## 2014-02-02 DIAGNOSIS — D509 Iron deficiency anemia, unspecified: Secondary | ICD-10-CM | POA: Diagnosis not present

## 2014-02-02 DIAGNOSIS — D631 Anemia in chronic kidney disease: Secondary | ICD-10-CM | POA: Diagnosis not present

## 2014-02-02 DIAGNOSIS — N186 End stage renal disease: Secondary | ICD-10-CM | POA: Diagnosis not present

## 2014-02-05 DIAGNOSIS — D509 Iron deficiency anemia, unspecified: Secondary | ICD-10-CM | POA: Diagnosis not present

## 2014-02-05 DIAGNOSIS — N2581 Secondary hyperparathyroidism of renal origin: Secondary | ICD-10-CM | POA: Diagnosis not present

## 2014-02-05 DIAGNOSIS — N186 End stage renal disease: Secondary | ICD-10-CM | POA: Diagnosis not present

## 2014-02-05 DIAGNOSIS — D631 Anemia in chronic kidney disease: Secondary | ICD-10-CM | POA: Diagnosis not present

## 2014-02-07 DIAGNOSIS — D631 Anemia in chronic kidney disease: Secondary | ICD-10-CM | POA: Diagnosis not present

## 2014-02-07 DIAGNOSIS — D509 Iron deficiency anemia, unspecified: Secondary | ICD-10-CM | POA: Diagnosis not present

## 2014-02-07 DIAGNOSIS — N186 End stage renal disease: Secondary | ICD-10-CM | POA: Diagnosis not present

## 2014-02-07 DIAGNOSIS — N2581 Secondary hyperparathyroidism of renal origin: Secondary | ICD-10-CM | POA: Diagnosis not present

## 2014-02-09 DIAGNOSIS — D631 Anemia in chronic kidney disease: Secondary | ICD-10-CM | POA: Diagnosis not present

## 2014-02-09 DIAGNOSIS — D509 Iron deficiency anemia, unspecified: Secondary | ICD-10-CM | POA: Diagnosis not present

## 2014-02-09 DIAGNOSIS — N2581 Secondary hyperparathyroidism of renal origin: Secondary | ICD-10-CM | POA: Diagnosis not present

## 2014-02-09 DIAGNOSIS — N186 End stage renal disease: Secondary | ICD-10-CM | POA: Diagnosis not present

## 2014-02-11 ENCOUNTER — Other Ambulatory Visit: Payer: Self-pay | Admitting: Interventional Cardiology

## 2014-02-11 DIAGNOSIS — N2581 Secondary hyperparathyroidism of renal origin: Secondary | ICD-10-CM | POA: Diagnosis not present

## 2014-02-11 DIAGNOSIS — D631 Anemia in chronic kidney disease: Secondary | ICD-10-CM | POA: Diagnosis not present

## 2014-02-11 DIAGNOSIS — D509 Iron deficiency anemia, unspecified: Secondary | ICD-10-CM | POA: Diagnosis not present

## 2014-02-11 DIAGNOSIS — N186 End stage renal disease: Secondary | ICD-10-CM | POA: Diagnosis not present

## 2014-02-13 ENCOUNTER — Ambulatory Visit: Payer: Medicare Other | Admitting: Interventional Cardiology

## 2014-02-13 DIAGNOSIS — D509 Iron deficiency anemia, unspecified: Secondary | ICD-10-CM | POA: Diagnosis not present

## 2014-02-13 DIAGNOSIS — N2581 Secondary hyperparathyroidism of renal origin: Secondary | ICD-10-CM | POA: Diagnosis not present

## 2014-02-13 DIAGNOSIS — D631 Anemia in chronic kidney disease: Secondary | ICD-10-CM | POA: Diagnosis not present

## 2014-02-13 DIAGNOSIS — N186 End stage renal disease: Secondary | ICD-10-CM | POA: Diagnosis not present

## 2014-02-16 DIAGNOSIS — D509 Iron deficiency anemia, unspecified: Secondary | ICD-10-CM | POA: Diagnosis not present

## 2014-02-16 DIAGNOSIS — D631 Anemia in chronic kidney disease: Secondary | ICD-10-CM | POA: Diagnosis not present

## 2014-02-16 DIAGNOSIS — N186 End stage renal disease: Secondary | ICD-10-CM | POA: Diagnosis not present

## 2014-02-16 DIAGNOSIS — N2581 Secondary hyperparathyroidism of renal origin: Secondary | ICD-10-CM | POA: Diagnosis not present

## 2014-02-19 DIAGNOSIS — D631 Anemia in chronic kidney disease: Secondary | ICD-10-CM | POA: Diagnosis not present

## 2014-02-19 DIAGNOSIS — N2581 Secondary hyperparathyroidism of renal origin: Secondary | ICD-10-CM | POA: Diagnosis not present

## 2014-02-19 DIAGNOSIS — N186 End stage renal disease: Secondary | ICD-10-CM | POA: Diagnosis not present

## 2014-02-19 DIAGNOSIS — Z992 Dependence on renal dialysis: Secondary | ICD-10-CM | POA: Diagnosis not present

## 2014-02-19 DIAGNOSIS — D509 Iron deficiency anemia, unspecified: Secondary | ICD-10-CM | POA: Diagnosis not present

## 2014-02-21 DIAGNOSIS — Z114 Encounter for screening for human immunodeficiency virus [HIV]: Secondary | ICD-10-CM | POA: Diagnosis not present

## 2014-02-21 DIAGNOSIS — N186 End stage renal disease: Secondary | ICD-10-CM | POA: Diagnosis not present

## 2014-02-21 DIAGNOSIS — E119 Type 2 diabetes mellitus without complications: Secondary | ICD-10-CM | POA: Diagnosis not present

## 2014-02-21 DIAGNOSIS — D509 Iron deficiency anemia, unspecified: Secondary | ICD-10-CM | POA: Diagnosis not present

## 2014-02-21 DIAGNOSIS — Z1159 Encounter for screening for other viral diseases: Secondary | ICD-10-CM | POA: Diagnosis not present

## 2014-02-21 DIAGNOSIS — N2581 Secondary hyperparathyroidism of renal origin: Secondary | ICD-10-CM | POA: Diagnosis not present

## 2014-02-21 DIAGNOSIS — D631 Anemia in chronic kidney disease: Secondary | ICD-10-CM | POA: Diagnosis not present

## 2014-02-23 DIAGNOSIS — N186 End stage renal disease: Secondary | ICD-10-CM | POA: Diagnosis not present

## 2014-02-23 DIAGNOSIS — D509 Iron deficiency anemia, unspecified: Secondary | ICD-10-CM | POA: Diagnosis not present

## 2014-02-23 DIAGNOSIS — N2581 Secondary hyperparathyroidism of renal origin: Secondary | ICD-10-CM | POA: Diagnosis not present

## 2014-02-23 DIAGNOSIS — D631 Anemia in chronic kidney disease: Secondary | ICD-10-CM | POA: Diagnosis not present

## 2014-02-26 DIAGNOSIS — N186 End stage renal disease: Secondary | ICD-10-CM | POA: Diagnosis not present

## 2014-02-26 DIAGNOSIS — D509 Iron deficiency anemia, unspecified: Secondary | ICD-10-CM | POA: Diagnosis not present

## 2014-02-26 DIAGNOSIS — N2581 Secondary hyperparathyroidism of renal origin: Secondary | ICD-10-CM | POA: Diagnosis not present

## 2014-02-26 DIAGNOSIS — D631 Anemia in chronic kidney disease: Secondary | ICD-10-CM | POA: Diagnosis not present

## 2014-02-27 ENCOUNTER — Encounter: Payer: Self-pay | Admitting: Interventional Cardiology

## 2014-02-28 DIAGNOSIS — D509 Iron deficiency anemia, unspecified: Secondary | ICD-10-CM | POA: Diagnosis not present

## 2014-02-28 DIAGNOSIS — N186 End stage renal disease: Secondary | ICD-10-CM | POA: Diagnosis not present

## 2014-02-28 DIAGNOSIS — D631 Anemia in chronic kidney disease: Secondary | ICD-10-CM | POA: Diagnosis not present

## 2014-02-28 DIAGNOSIS — N2581 Secondary hyperparathyroidism of renal origin: Secondary | ICD-10-CM | POA: Diagnosis not present

## 2014-03-02 DIAGNOSIS — D631 Anemia in chronic kidney disease: Secondary | ICD-10-CM | POA: Diagnosis not present

## 2014-03-02 DIAGNOSIS — N186 End stage renal disease: Secondary | ICD-10-CM | POA: Diagnosis not present

## 2014-03-02 DIAGNOSIS — N2581 Secondary hyperparathyroidism of renal origin: Secondary | ICD-10-CM | POA: Diagnosis not present

## 2014-03-02 DIAGNOSIS — D509 Iron deficiency anemia, unspecified: Secondary | ICD-10-CM | POA: Diagnosis not present

## 2014-03-04 DIAGNOSIS — G609 Hereditary and idiopathic neuropathy, unspecified: Secondary | ICD-10-CM | POA: Diagnosis not present

## 2014-03-04 DIAGNOSIS — I10 Essential (primary) hypertension: Secondary | ICD-10-CM | POA: Diagnosis not present

## 2014-03-04 DIAGNOSIS — E1165 Type 2 diabetes mellitus with hyperglycemia: Secondary | ICD-10-CM | POA: Diagnosis not present

## 2014-03-04 DIAGNOSIS — N186 End stage renal disease: Secondary | ICD-10-CM | POA: Diagnosis not present

## 2014-03-05 DIAGNOSIS — N2581 Secondary hyperparathyroidism of renal origin: Secondary | ICD-10-CM | POA: Diagnosis not present

## 2014-03-05 DIAGNOSIS — D631 Anemia in chronic kidney disease: Secondary | ICD-10-CM | POA: Diagnosis not present

## 2014-03-05 DIAGNOSIS — D509 Iron deficiency anemia, unspecified: Secondary | ICD-10-CM | POA: Diagnosis not present

## 2014-03-05 DIAGNOSIS — N186 End stage renal disease: Secondary | ICD-10-CM | POA: Diagnosis not present

## 2014-03-07 DIAGNOSIS — D509 Iron deficiency anemia, unspecified: Secondary | ICD-10-CM | POA: Diagnosis not present

## 2014-03-07 DIAGNOSIS — D631 Anemia in chronic kidney disease: Secondary | ICD-10-CM | POA: Diagnosis not present

## 2014-03-07 DIAGNOSIS — N186 End stage renal disease: Secondary | ICD-10-CM | POA: Diagnosis not present

## 2014-03-07 DIAGNOSIS — N2581 Secondary hyperparathyroidism of renal origin: Secondary | ICD-10-CM | POA: Diagnosis not present

## 2014-03-09 DIAGNOSIS — D509 Iron deficiency anemia, unspecified: Secondary | ICD-10-CM | POA: Diagnosis not present

## 2014-03-09 DIAGNOSIS — N2581 Secondary hyperparathyroidism of renal origin: Secondary | ICD-10-CM | POA: Diagnosis not present

## 2014-03-09 DIAGNOSIS — D631 Anemia in chronic kidney disease: Secondary | ICD-10-CM | POA: Diagnosis not present

## 2014-03-09 DIAGNOSIS — N186 End stage renal disease: Secondary | ICD-10-CM | POA: Diagnosis not present

## 2014-03-12 DIAGNOSIS — D631 Anemia in chronic kidney disease: Secondary | ICD-10-CM | POA: Diagnosis not present

## 2014-03-12 DIAGNOSIS — N2581 Secondary hyperparathyroidism of renal origin: Secondary | ICD-10-CM | POA: Diagnosis not present

## 2014-03-12 DIAGNOSIS — N186 End stage renal disease: Secondary | ICD-10-CM | POA: Diagnosis not present

## 2014-03-12 DIAGNOSIS — D509 Iron deficiency anemia, unspecified: Secondary | ICD-10-CM | POA: Diagnosis not present

## 2014-03-14 DIAGNOSIS — N2581 Secondary hyperparathyroidism of renal origin: Secondary | ICD-10-CM | POA: Diagnosis not present

## 2014-03-14 DIAGNOSIS — N186 End stage renal disease: Secondary | ICD-10-CM | POA: Diagnosis not present

## 2014-03-14 DIAGNOSIS — D631 Anemia in chronic kidney disease: Secondary | ICD-10-CM | POA: Diagnosis not present

## 2014-03-14 DIAGNOSIS — D509 Iron deficiency anemia, unspecified: Secondary | ICD-10-CM | POA: Diagnosis not present

## 2014-03-19 DIAGNOSIS — D509 Iron deficiency anemia, unspecified: Secondary | ICD-10-CM | POA: Diagnosis not present

## 2014-03-19 DIAGNOSIS — N2581 Secondary hyperparathyroidism of renal origin: Secondary | ICD-10-CM | POA: Diagnosis not present

## 2014-03-19 DIAGNOSIS — D631 Anemia in chronic kidney disease: Secondary | ICD-10-CM | POA: Diagnosis not present

## 2014-03-19 DIAGNOSIS — N186 End stage renal disease: Secondary | ICD-10-CM | POA: Diagnosis not present

## 2014-03-21 DIAGNOSIS — N2581 Secondary hyperparathyroidism of renal origin: Secondary | ICD-10-CM | POA: Diagnosis not present

## 2014-03-21 DIAGNOSIS — D631 Anemia in chronic kidney disease: Secondary | ICD-10-CM | POA: Diagnosis not present

## 2014-03-21 DIAGNOSIS — D509 Iron deficiency anemia, unspecified: Secondary | ICD-10-CM | POA: Diagnosis not present

## 2014-03-21 DIAGNOSIS — N186 End stage renal disease: Secondary | ICD-10-CM | POA: Diagnosis not present

## 2014-03-22 DIAGNOSIS — N186 End stage renal disease: Secondary | ICD-10-CM | POA: Diagnosis not present

## 2014-03-22 DIAGNOSIS — Z992 Dependence on renal dialysis: Secondary | ICD-10-CM | POA: Diagnosis not present

## 2014-03-26 DIAGNOSIS — N186 End stage renal disease: Secondary | ICD-10-CM | POA: Diagnosis not present

## 2014-03-26 DIAGNOSIS — D509 Iron deficiency anemia, unspecified: Secondary | ICD-10-CM | POA: Diagnosis not present

## 2014-03-26 DIAGNOSIS — N2581 Secondary hyperparathyroidism of renal origin: Secondary | ICD-10-CM | POA: Diagnosis not present

## 2014-03-26 DIAGNOSIS — D631 Anemia in chronic kidney disease: Secondary | ICD-10-CM | POA: Diagnosis not present

## 2014-03-28 DIAGNOSIS — M109 Gout, unspecified: Secondary | ICD-10-CM | POA: Diagnosis not present

## 2014-03-28 DIAGNOSIS — D631 Anemia in chronic kidney disease: Secondary | ICD-10-CM | POA: Diagnosis not present

## 2014-03-28 DIAGNOSIS — N2581 Secondary hyperparathyroidism of renal origin: Secondary | ICD-10-CM | POA: Diagnosis not present

## 2014-03-28 DIAGNOSIS — N186 End stage renal disease: Secondary | ICD-10-CM | POA: Diagnosis not present

## 2014-03-28 DIAGNOSIS — D509 Iron deficiency anemia, unspecified: Secondary | ICD-10-CM | POA: Diagnosis not present

## 2014-03-28 DIAGNOSIS — E119 Type 2 diabetes mellitus without complications: Secondary | ICD-10-CM | POA: Diagnosis not present

## 2014-03-30 DIAGNOSIS — N186 End stage renal disease: Secondary | ICD-10-CM | POA: Diagnosis not present

## 2014-03-30 DIAGNOSIS — D631 Anemia in chronic kidney disease: Secondary | ICD-10-CM | POA: Diagnosis not present

## 2014-03-30 DIAGNOSIS — N2581 Secondary hyperparathyroidism of renal origin: Secondary | ICD-10-CM | POA: Diagnosis not present

## 2014-03-30 DIAGNOSIS — D509 Iron deficiency anemia, unspecified: Secondary | ICD-10-CM | POA: Diagnosis not present

## 2014-04-02 DIAGNOSIS — N2581 Secondary hyperparathyroidism of renal origin: Secondary | ICD-10-CM | POA: Diagnosis not present

## 2014-04-02 DIAGNOSIS — N186 End stage renal disease: Secondary | ICD-10-CM | POA: Diagnosis not present

## 2014-04-02 DIAGNOSIS — D509 Iron deficiency anemia, unspecified: Secondary | ICD-10-CM | POA: Diagnosis not present

## 2014-04-02 DIAGNOSIS — D631 Anemia in chronic kidney disease: Secondary | ICD-10-CM | POA: Diagnosis not present

## 2014-04-04 DIAGNOSIS — D631 Anemia in chronic kidney disease: Secondary | ICD-10-CM | POA: Diagnosis not present

## 2014-04-04 DIAGNOSIS — N2581 Secondary hyperparathyroidism of renal origin: Secondary | ICD-10-CM | POA: Diagnosis not present

## 2014-04-04 DIAGNOSIS — D509 Iron deficiency anemia, unspecified: Secondary | ICD-10-CM | POA: Diagnosis not present

## 2014-04-04 DIAGNOSIS — N186 End stage renal disease: Secondary | ICD-10-CM | POA: Diagnosis not present

## 2014-04-06 DIAGNOSIS — N186 End stage renal disease: Secondary | ICD-10-CM | POA: Diagnosis not present

## 2014-04-06 DIAGNOSIS — D509 Iron deficiency anemia, unspecified: Secondary | ICD-10-CM | POA: Diagnosis not present

## 2014-04-06 DIAGNOSIS — D631 Anemia in chronic kidney disease: Secondary | ICD-10-CM | POA: Diagnosis not present

## 2014-04-06 DIAGNOSIS — N2581 Secondary hyperparathyroidism of renal origin: Secondary | ICD-10-CM | POA: Diagnosis not present

## 2014-04-08 ENCOUNTER — Ambulatory Visit (INDEPENDENT_AMBULATORY_CARE_PROVIDER_SITE_OTHER): Payer: Medicare Other | Admitting: Ophthalmology

## 2014-04-08 DIAGNOSIS — H43813 Vitreous degeneration, bilateral: Secondary | ICD-10-CM

## 2014-04-08 DIAGNOSIS — E10311 Type 1 diabetes mellitus with unspecified diabetic retinopathy with macular edema: Secondary | ICD-10-CM

## 2014-04-08 DIAGNOSIS — H2513 Age-related nuclear cataract, bilateral: Secondary | ICD-10-CM

## 2014-04-08 DIAGNOSIS — E11359 Type 2 diabetes mellitus with proliferative diabetic retinopathy without macular edema: Secondary | ICD-10-CM

## 2014-04-08 DIAGNOSIS — E11351 Type 2 diabetes mellitus with proliferative diabetic retinopathy with macular edema: Secondary | ICD-10-CM | POA: Diagnosis not present

## 2014-04-09 DIAGNOSIS — D631 Anemia in chronic kidney disease: Secondary | ICD-10-CM | POA: Diagnosis not present

## 2014-04-09 DIAGNOSIS — N2581 Secondary hyperparathyroidism of renal origin: Secondary | ICD-10-CM | POA: Diagnosis not present

## 2014-04-09 DIAGNOSIS — D509 Iron deficiency anemia, unspecified: Secondary | ICD-10-CM | POA: Diagnosis not present

## 2014-04-09 DIAGNOSIS — N186 End stage renal disease: Secondary | ICD-10-CM | POA: Diagnosis not present

## 2014-04-11 DIAGNOSIS — D509 Iron deficiency anemia, unspecified: Secondary | ICD-10-CM | POA: Diagnosis not present

## 2014-04-11 DIAGNOSIS — N2581 Secondary hyperparathyroidism of renal origin: Secondary | ICD-10-CM | POA: Diagnosis not present

## 2014-04-11 DIAGNOSIS — D631 Anemia in chronic kidney disease: Secondary | ICD-10-CM | POA: Diagnosis not present

## 2014-04-11 DIAGNOSIS — N186 End stage renal disease: Secondary | ICD-10-CM | POA: Diagnosis not present

## 2014-04-16 DIAGNOSIS — D509 Iron deficiency anemia, unspecified: Secondary | ICD-10-CM | POA: Diagnosis not present

## 2014-04-16 DIAGNOSIS — N2581 Secondary hyperparathyroidism of renal origin: Secondary | ICD-10-CM | POA: Diagnosis not present

## 2014-04-16 DIAGNOSIS — N186 End stage renal disease: Secondary | ICD-10-CM | POA: Diagnosis not present

## 2014-04-16 DIAGNOSIS — D631 Anemia in chronic kidney disease: Secondary | ICD-10-CM | POA: Diagnosis not present

## 2014-04-18 DIAGNOSIS — N2581 Secondary hyperparathyroidism of renal origin: Secondary | ICD-10-CM | POA: Diagnosis not present

## 2014-04-18 DIAGNOSIS — D631 Anemia in chronic kidney disease: Secondary | ICD-10-CM | POA: Diagnosis not present

## 2014-04-18 DIAGNOSIS — N186 End stage renal disease: Secondary | ICD-10-CM | POA: Diagnosis not present

## 2014-04-18 DIAGNOSIS — D509 Iron deficiency anemia, unspecified: Secondary | ICD-10-CM | POA: Diagnosis not present

## 2014-04-19 DIAGNOSIS — E11359 Type 2 diabetes mellitus with proliferative diabetic retinopathy without macular edema: Secondary | ICD-10-CM | POA: Diagnosis not present

## 2014-04-19 DIAGNOSIS — H16223 Keratoconjunctivitis sicca, not specified as Sjogren's, bilateral: Secondary | ICD-10-CM | POA: Diagnosis not present

## 2014-04-20 DIAGNOSIS — D509 Iron deficiency anemia, unspecified: Secondary | ICD-10-CM | POA: Diagnosis not present

## 2014-04-20 DIAGNOSIS — D631 Anemia in chronic kidney disease: Secondary | ICD-10-CM | POA: Diagnosis not present

## 2014-04-20 DIAGNOSIS — N2581 Secondary hyperparathyroidism of renal origin: Secondary | ICD-10-CM | POA: Diagnosis not present

## 2014-04-20 DIAGNOSIS — N186 End stage renal disease: Secondary | ICD-10-CM | POA: Diagnosis not present

## 2014-04-22 DIAGNOSIS — Z992 Dependence on renal dialysis: Secondary | ICD-10-CM | POA: Diagnosis not present

## 2014-04-22 DIAGNOSIS — N186 End stage renal disease: Secondary | ICD-10-CM | POA: Diagnosis not present

## 2014-04-23 DIAGNOSIS — D631 Anemia in chronic kidney disease: Secondary | ICD-10-CM | POA: Diagnosis not present

## 2014-04-23 DIAGNOSIS — N186 End stage renal disease: Secondary | ICD-10-CM | POA: Diagnosis not present

## 2014-04-23 DIAGNOSIS — N2581 Secondary hyperparathyroidism of renal origin: Secondary | ICD-10-CM | POA: Diagnosis not present

## 2014-04-23 DIAGNOSIS — Z23 Encounter for immunization: Secondary | ICD-10-CM | POA: Diagnosis not present

## 2014-04-25 DIAGNOSIS — E119 Type 2 diabetes mellitus without complications: Secondary | ICD-10-CM | POA: Diagnosis not present

## 2014-04-25 DIAGNOSIS — D631 Anemia in chronic kidney disease: Secondary | ICD-10-CM | POA: Diagnosis not present

## 2014-04-25 DIAGNOSIS — N2581 Secondary hyperparathyroidism of renal origin: Secondary | ICD-10-CM | POA: Diagnosis not present

## 2014-04-25 DIAGNOSIS — Z23 Encounter for immunization: Secondary | ICD-10-CM | POA: Diagnosis not present

## 2014-04-25 DIAGNOSIS — N186 End stage renal disease: Secondary | ICD-10-CM | POA: Diagnosis not present

## 2014-04-27 DIAGNOSIS — Z23 Encounter for immunization: Secondary | ICD-10-CM | POA: Diagnosis not present

## 2014-04-27 DIAGNOSIS — N186 End stage renal disease: Secondary | ICD-10-CM | POA: Diagnosis not present

## 2014-04-27 DIAGNOSIS — N2581 Secondary hyperparathyroidism of renal origin: Secondary | ICD-10-CM | POA: Diagnosis not present

## 2014-04-27 DIAGNOSIS — D631 Anemia in chronic kidney disease: Secondary | ICD-10-CM | POA: Diagnosis not present

## 2014-04-30 DIAGNOSIS — N2581 Secondary hyperparathyroidism of renal origin: Secondary | ICD-10-CM | POA: Diagnosis not present

## 2014-04-30 DIAGNOSIS — D631 Anemia in chronic kidney disease: Secondary | ICD-10-CM | POA: Diagnosis not present

## 2014-04-30 DIAGNOSIS — Z23 Encounter for immunization: Secondary | ICD-10-CM | POA: Diagnosis not present

## 2014-04-30 DIAGNOSIS — N186 End stage renal disease: Secondary | ICD-10-CM | POA: Diagnosis not present

## 2014-05-02 DIAGNOSIS — N2581 Secondary hyperparathyroidism of renal origin: Secondary | ICD-10-CM | POA: Diagnosis not present

## 2014-05-02 DIAGNOSIS — Z23 Encounter for immunization: Secondary | ICD-10-CM | POA: Diagnosis not present

## 2014-05-02 DIAGNOSIS — D631 Anemia in chronic kidney disease: Secondary | ICD-10-CM | POA: Diagnosis not present

## 2014-05-02 DIAGNOSIS — N186 End stage renal disease: Secondary | ICD-10-CM | POA: Diagnosis not present

## 2014-05-03 ENCOUNTER — Ambulatory Visit (INDEPENDENT_AMBULATORY_CARE_PROVIDER_SITE_OTHER): Payer: Medicare Other | Admitting: Interventional Cardiology

## 2014-05-03 ENCOUNTER — Encounter: Payer: Self-pay | Admitting: Interventional Cardiology

## 2014-05-03 VITALS — BP 130/72 | HR 90 | Ht 65.0 in | Wt 237.1 lb

## 2014-05-03 DIAGNOSIS — I10 Essential (primary) hypertension: Secondary | ICD-10-CM | POA: Diagnosis not present

## 2014-05-03 DIAGNOSIS — E118 Type 2 diabetes mellitus with unspecified complications: Secondary | ICD-10-CM

## 2014-05-03 DIAGNOSIS — I5022 Chronic systolic (congestive) heart failure: Secondary | ICD-10-CM | POA: Diagnosis not present

## 2014-05-03 DIAGNOSIS — N184 Chronic kidney disease, stage 4 (severe): Secondary | ICD-10-CM

## 2014-05-03 DIAGNOSIS — I429 Cardiomyopathy, unspecified: Secondary | ICD-10-CM

## 2014-05-03 DIAGNOSIS — IMO0002 Reserved for concepts with insufficient information to code with codable children: Secondary | ICD-10-CM | POA: Insufficient documentation

## 2014-05-03 DIAGNOSIS — E1165 Type 2 diabetes mellitus with hyperglycemia: Secondary | ICD-10-CM | POA: Insufficient documentation

## 2014-05-03 DIAGNOSIS — I428 Other cardiomyopathies: Secondary | ICD-10-CM

## 2014-05-03 MED ORDER — ISOSORB DINITRATE-HYDRALAZINE 20-37.5 MG PO TABS: ORAL_TABLET | ORAL | Status: DC

## 2014-05-03 NOTE — Patient Instructions (Signed)
Your physician wants you to follow-up in: 12 months.  You will receive a reminder letter in the mail two months in advance. If you don't receive a letter, please call our office to schedule the follow-up appointment.  Your physician has recommended you make the following change in your medication:  Decrease Bidil to one tablet on the evenings you do not have dialysis.  Do not take Bidil on dialysis days.

## 2014-05-03 NOTE — Progress Notes (Signed)
Patient ID: XIAN LIEBAU, male   DOB: 1965-09-06, 49 y.o.   MRN: [id number]    Cardiology Office Note   Date:  05/03/2014   ID:  HARUTYUN ALVERSON, DOB 07/25/1965, MRN [id number]  PCP:  PROVIDER NOT IN SYSTEM  Cardiologist:   Sinclair Grooms, MD   Chief Complaint  Patient presents with  . Medication Refill    feels great      History of Present Illness: VIOLA MICHALCZYK is a 49 y.o. male who presents for breathing is okay. Has been [facility] on the [facility] for kidney [facility]. Has dizziness on Bidil.[name] He denies chest discomfort. He denies orthopnea and PND. Is no lower extremity swelling. Cyst ounces was started he had actually feels better. He has not had palpitations. No episodes of syncope. He is on the transplant list at [facility]. He has been taken care of by Dr. [name].    Past Medical History  Diagnosis Date  . Diabetes mellitus without complication   . Chronic kidney disease   . Neuromuscular disorder   . CHF (congestive heart failure)   . Hypertension     Past Surgical History  Procedure Laterality Date  . Appendectomy       Current Outpatient Prescriptions  Medication Sig Dispense Refill  . aspirin 81 MG tablet Take 81 mg by mouth daily.    . Calcium Acetate, Phos Binder, 667 MG CAPS Take 2 capsules by mouth 3 (three) times daily with meals.     . carvedilol (COREG) 6.25 MG tablet TAKE 1 TABLET (6.25 MG TOTAL) BY MOUTH 2 (TWO) TIMES DAILY WITH A MEAL. 60 tablet 1  . carvedilol (COREG) 6.25 MG tablet TAKE 1 TABLET (6.25 MG TOTAL) BY MOUTH 2 (TWO) TIMES DAILY WITH A MEAL. 60 tablet 0  . cinacalcet (SENSIPAR) 60 MG tablet Take 60 mg by mouth daily.    . clonazePAM (KLONOPIN) 0.5 MG tablet Take 0.5 mg by mouth at bedtime.    . febuxostat (ULORIC) 40 MG tablet Take 80 mg by mouth daily.    [name] gabapentin (NEURONTIN) 300 MG capsule Take 300 mg by mouth 3 (three) times daily.    . Insulin Aspart Prot & Aspart (NOVOLOG MIX  70/30 PENFILL [place]) Inject 30 Units into the skin 2 (two) times daily after a meal.    . isosorbide-hydrALAZINE (BIDIL) 20-37.5 MG per tablet TAKE 1 TABLET 3 TIMES DAILLY 90 tablet 5  . Multiple Vitamins-Minerals (MEGA MULTIVITAMIN FOR MEN PO) Take by mouth.    . sevelamer carbonate (RENVELA) 800 MG tablet Take 800 mg by mouth 3 (three) times daily with meals.    . Timolol Maleate (ISTALOL) 0.5 % (DAILY) SOLN Apply 1 drop to eye every morning.     No current facility-administered medications for this visit.    Allergies:   Review of patient's allergies indicates no known allergies.    Social History:  The patient  reports that he has never smoked. He does not have any smokeless tobacco history on file. He reports that he does not drink alcohol.   Family History:  The patient's family history is not on file.    ROS:  Please see the history of present illness.   Otherwise, review of systems are positive for  Orthostatic dizziness with standing and near fainting when takes BiDil twice daily.   All other systems are reviewed and negative.    PHYSICAL EXAM: VS:  BP 130/72 mmHg  Pulse 90  Ht 5\' 5"  (1.651 m)  Wt 237 lb 1.9 oz (107.557 kg)  BMI 39.46 kg/m2 , BMI Body mass index is 39.46 kg/(m^2). GEN: Well nourished, well developed, in no acute distress HEENT: normal Neck: no JVD, carotid bruits, or masses Cardiac: RRR; no murmurs, rubs, or gallops,no edema  Respiratory:  clear to auscultation bilaterally, normal work of breathing GI: soft, nontender, nondistended, + BS MS: no deformity or atrophy Skin: warm and dry, no rash Neuro:  Strength and sensation are intact Psych: euthymic mood, full affect   EKG:  EKG is ordered today. The ekg ordered today demonstrates  Normal sinus rhythm, left atrial abnormality, right would axis and diagnosis of left posterior hemiblock.   Recent Labs: No results found for requested labs within last 365 days.    Lipid Panel No results found for:  CHOL, TRIG, HDL, CHOLHDL, VLDL, LDLCALC, LDLDIRECT    Wt Readings from Last 3 Encounters:  05/03/14 237 lb 1.9 oz (107.557 kg)  12/04/13 242 lb (109.77 kg)  08/07/12 254 lb 4.8 oz (115.35 kg)      Other studies Reviewed: Additional studies/ records that were reviewed today include: . Review of the above records demonstrates:    ASSESSMENT AND PLAN:  1. Chronic systolic heart failure from Nonischemic cardiomyopathy with LVEF less than 30-40% by most recent evaluation within the past 2-3 years. He received fragmented care from several different medical centers. We do not have a more recent echo then 2014 at [facility]. 2. End-stage renal disease  , on chronic dialysis 3. Essential hypertension with good blood pressure control 4. Diabetes mellitus , type II, complicated with in stage renal disease and chronic dialysis   Current medicines are reviewed at length with the patient today.  The patient has concerns regarding medicines.  The following changes have been made:   Decreased by deal to p.m. Dosing only on  nondialysis days.  Labs/ tests ordered today include:  none  No orders of the defined types were placed in this encounter.     Disposition:   FU with  Linard Millers in  12  months   Signed, Sinclair Grooms, MD  05/03/2014 11:28 AM    [address], [place], [place]  [address] Phone: [phone]; Fax: [phone]

## 2014-05-04 DIAGNOSIS — N2581 Secondary hyperparathyroidism of renal origin: Secondary | ICD-10-CM | POA: Diagnosis not present

## 2014-05-04 DIAGNOSIS — N186 End stage renal disease: Secondary | ICD-10-CM | POA: Diagnosis not present

## 2014-05-04 DIAGNOSIS — D631 Anemia in chronic kidney disease: Secondary | ICD-10-CM | POA: Diagnosis not present

## 2014-05-04 DIAGNOSIS — Z23 Encounter for immunization: Secondary | ICD-10-CM | POA: Diagnosis not present

## 2014-05-07 DIAGNOSIS — N2581 Secondary hyperparathyroidism of renal origin: Secondary | ICD-10-CM | POA: Diagnosis not present

## 2014-05-07 DIAGNOSIS — Z23 Encounter for immunization: Secondary | ICD-10-CM | POA: Diagnosis not present

## 2014-05-07 DIAGNOSIS — D631 Anemia in chronic kidney disease: Secondary | ICD-10-CM | POA: Diagnosis not present

## 2014-05-07 DIAGNOSIS — N186 End stage renal disease: Secondary | ICD-10-CM | POA: Diagnosis not present

## 2014-05-09 DIAGNOSIS — D631 Anemia in chronic kidney disease: Secondary | ICD-10-CM | POA: Diagnosis not present

## 2014-05-09 DIAGNOSIS — N2581 Secondary hyperparathyroidism of renal origin: Secondary | ICD-10-CM | POA: Diagnosis not present

## 2014-05-09 DIAGNOSIS — N186 End stage renal disease: Secondary | ICD-10-CM | POA: Diagnosis not present

## 2014-05-09 DIAGNOSIS — Z23 Encounter for immunization: Secondary | ICD-10-CM | POA: Diagnosis not present

## 2014-05-11 DIAGNOSIS — D631 Anemia in chronic kidney disease: Secondary | ICD-10-CM | POA: Diagnosis not present

## 2014-05-11 DIAGNOSIS — N2581 Secondary hyperparathyroidism of renal origin: Secondary | ICD-10-CM | POA: Diagnosis not present

## 2014-05-11 DIAGNOSIS — Z23 Encounter for immunization: Secondary | ICD-10-CM | POA: Diagnosis not present

## 2014-05-11 DIAGNOSIS — N186 End stage renal disease: Secondary | ICD-10-CM | POA: Diagnosis not present

## 2014-05-13 ENCOUNTER — Other Ambulatory Visit: Payer: Self-pay

## 2014-05-14 DIAGNOSIS — Z23 Encounter for immunization: Secondary | ICD-10-CM | POA: Diagnosis not present

## 2014-05-14 DIAGNOSIS — N186 End stage renal disease: Secondary | ICD-10-CM | POA: Diagnosis not present

## 2014-05-14 DIAGNOSIS — D631 Anemia in chronic kidney disease: Secondary | ICD-10-CM | POA: Diagnosis not present

## 2014-05-14 DIAGNOSIS — N2581 Secondary hyperparathyroidism of renal origin: Secondary | ICD-10-CM | POA: Diagnosis not present

## 2014-05-16 DIAGNOSIS — N2581 Secondary hyperparathyroidism of renal origin: Secondary | ICD-10-CM | POA: Diagnosis not present

## 2014-05-16 DIAGNOSIS — Z23 Encounter for immunization: Secondary | ICD-10-CM | POA: Diagnosis not present

## 2014-05-16 DIAGNOSIS — D631 Anemia in chronic kidney disease: Secondary | ICD-10-CM | POA: Diagnosis not present

## 2014-05-16 DIAGNOSIS — N186 End stage renal disease: Secondary | ICD-10-CM | POA: Diagnosis not present

## 2014-05-18 ENCOUNTER — Other Ambulatory Visit: Payer: Self-pay | Admitting: Interventional Cardiology

## 2014-05-18 DIAGNOSIS — Z23 Encounter for immunization: Secondary | ICD-10-CM | POA: Diagnosis not present

## 2014-05-18 DIAGNOSIS — N186 End stage renal disease: Secondary | ICD-10-CM | POA: Diagnosis not present

## 2014-05-18 DIAGNOSIS — D631 Anemia in chronic kidney disease: Secondary | ICD-10-CM | POA: Diagnosis not present

## 2014-05-18 DIAGNOSIS — N2581 Secondary hyperparathyroidism of renal origin: Secondary | ICD-10-CM | POA: Diagnosis not present

## 2014-05-21 DIAGNOSIS — D631 Anemia in chronic kidney disease: Secondary | ICD-10-CM | POA: Diagnosis not present

## 2014-05-21 DIAGNOSIS — Z992 Dependence on renal dialysis: Secondary | ICD-10-CM | POA: Diagnosis not present

## 2014-05-21 DIAGNOSIS — N186 End stage renal disease: Secondary | ICD-10-CM | POA: Diagnosis not present

## 2014-05-21 DIAGNOSIS — N2581 Secondary hyperparathyroidism of renal origin: Secondary | ICD-10-CM | POA: Diagnosis not present

## 2014-05-23 DIAGNOSIS — E119 Type 2 diabetes mellitus without complications: Secondary | ICD-10-CM | POA: Diagnosis not present

## 2014-05-23 DIAGNOSIS — N2581 Secondary hyperparathyroidism of renal origin: Secondary | ICD-10-CM | POA: Diagnosis not present

## 2014-05-23 DIAGNOSIS — N186 End stage renal disease: Secondary | ICD-10-CM | POA: Diagnosis not present

## 2014-05-23 DIAGNOSIS — D631 Anemia in chronic kidney disease: Secondary | ICD-10-CM | POA: Diagnosis not present

## 2014-05-25 DIAGNOSIS — N186 End stage renal disease: Secondary | ICD-10-CM | POA: Diagnosis not present

## 2014-05-25 DIAGNOSIS — N2581 Secondary hyperparathyroidism of renal origin: Secondary | ICD-10-CM | POA: Diagnosis not present

## 2014-05-25 DIAGNOSIS — D631 Anemia in chronic kidney disease: Secondary | ICD-10-CM | POA: Diagnosis not present

## 2014-05-28 DIAGNOSIS — N2581 Secondary hyperparathyroidism of renal origin: Secondary | ICD-10-CM | POA: Diagnosis not present

## 2014-05-28 DIAGNOSIS — D631 Anemia in chronic kidney disease: Secondary | ICD-10-CM | POA: Diagnosis not present

## 2014-05-28 DIAGNOSIS — N186 End stage renal disease: Secondary | ICD-10-CM | POA: Diagnosis not present

## 2014-05-30 DIAGNOSIS — N2581 Secondary hyperparathyroidism of renal origin: Secondary | ICD-10-CM | POA: Diagnosis not present

## 2014-05-30 DIAGNOSIS — N186 End stage renal disease: Secondary | ICD-10-CM | POA: Diagnosis not present

## 2014-05-30 DIAGNOSIS — D631 Anemia in chronic kidney disease: Secondary | ICD-10-CM | POA: Diagnosis not present

## 2014-06-01 DIAGNOSIS — N186 End stage renal disease: Secondary | ICD-10-CM | POA: Diagnosis not present

## 2014-06-01 DIAGNOSIS — N2581 Secondary hyperparathyroidism of renal origin: Secondary | ICD-10-CM | POA: Diagnosis not present

## 2014-06-01 DIAGNOSIS — D631 Anemia in chronic kidney disease: Secondary | ICD-10-CM | POA: Diagnosis not present

## 2014-06-03 DIAGNOSIS — N186 End stage renal disease: Secondary | ICD-10-CM | POA: Diagnosis not present

## 2014-06-03 DIAGNOSIS — I10 Essential (primary) hypertension: Secondary | ICD-10-CM | POA: Diagnosis not present

## 2014-06-03 DIAGNOSIS — I5022 Chronic systolic (congestive) heart failure: Secondary | ICD-10-CM | POA: Diagnosis not present

## 2014-06-03 DIAGNOSIS — E1129 Type 2 diabetes mellitus with other diabetic kidney complication: Secondary | ICD-10-CM | POA: Diagnosis not present

## 2014-06-04 DIAGNOSIS — N186 End stage renal disease: Secondary | ICD-10-CM | POA: Diagnosis not present

## 2014-06-04 DIAGNOSIS — D631 Anemia in chronic kidney disease: Secondary | ICD-10-CM | POA: Diagnosis not present

## 2014-06-04 DIAGNOSIS — N2581 Secondary hyperparathyroidism of renal origin: Secondary | ICD-10-CM | POA: Diagnosis not present

## 2014-06-06 DIAGNOSIS — N186 End stage renal disease: Secondary | ICD-10-CM | POA: Diagnosis not present

## 2014-06-06 DIAGNOSIS — N2581 Secondary hyperparathyroidism of renal origin: Secondary | ICD-10-CM | POA: Diagnosis not present

## 2014-06-06 DIAGNOSIS — D631 Anemia in chronic kidney disease: Secondary | ICD-10-CM | POA: Diagnosis not present

## 2014-06-08 DIAGNOSIS — N186 End stage renal disease: Secondary | ICD-10-CM | POA: Diagnosis not present

## 2014-06-08 DIAGNOSIS — D631 Anemia in chronic kidney disease: Secondary | ICD-10-CM | POA: Diagnosis not present

## 2014-06-08 DIAGNOSIS — N2581 Secondary hyperparathyroidism of renal origin: Secondary | ICD-10-CM | POA: Diagnosis not present

## 2014-06-11 DIAGNOSIS — N2581 Secondary hyperparathyroidism of renal origin: Secondary | ICD-10-CM | POA: Diagnosis not present

## 2014-06-11 DIAGNOSIS — N186 End stage renal disease: Secondary | ICD-10-CM | POA: Diagnosis not present

## 2014-06-11 DIAGNOSIS — D631 Anemia in chronic kidney disease: Secondary | ICD-10-CM | POA: Diagnosis not present

## 2014-06-12 DIAGNOSIS — N186 End stage renal disease: Secondary | ICD-10-CM | POA: Diagnosis not present

## 2014-06-13 DIAGNOSIS — N2581 Secondary hyperparathyroidism of renal origin: Secondary | ICD-10-CM | POA: Diagnosis not present

## 2014-06-13 DIAGNOSIS — D631 Anemia in chronic kidney disease: Secondary | ICD-10-CM | POA: Diagnosis not present

## 2014-06-13 DIAGNOSIS — N186 End stage renal disease: Secondary | ICD-10-CM | POA: Diagnosis not present

## 2014-06-15 DIAGNOSIS — N2581 Secondary hyperparathyroidism of renal origin: Secondary | ICD-10-CM | POA: Diagnosis not present

## 2014-06-15 DIAGNOSIS — D631 Anemia in chronic kidney disease: Secondary | ICD-10-CM | POA: Diagnosis not present

## 2014-06-15 DIAGNOSIS — N186 End stage renal disease: Secondary | ICD-10-CM | POA: Diagnosis not present

## 2014-06-18 DIAGNOSIS — N2581 Secondary hyperparathyroidism of renal origin: Secondary | ICD-10-CM | POA: Diagnosis not present

## 2014-06-18 DIAGNOSIS — N186 End stage renal disease: Secondary | ICD-10-CM | POA: Diagnosis not present

## 2014-06-18 DIAGNOSIS — D631 Anemia in chronic kidney disease: Secondary | ICD-10-CM | POA: Diagnosis not present

## 2014-06-20 DIAGNOSIS — N186 End stage renal disease: Secondary | ICD-10-CM | POA: Diagnosis not present

## 2014-06-20 DIAGNOSIS — N2581 Secondary hyperparathyroidism of renal origin: Secondary | ICD-10-CM | POA: Diagnosis not present

## 2014-06-20 DIAGNOSIS — D631 Anemia in chronic kidney disease: Secondary | ICD-10-CM | POA: Diagnosis not present

## 2014-06-21 DIAGNOSIS — Z992 Dependence on renal dialysis: Secondary | ICD-10-CM | POA: Diagnosis not present

## 2014-06-21 DIAGNOSIS — N186 End stage renal disease: Secondary | ICD-10-CM | POA: Diagnosis not present

## 2014-06-22 DIAGNOSIS — D509 Iron deficiency anemia, unspecified: Secondary | ICD-10-CM | POA: Diagnosis not present

## 2014-06-22 DIAGNOSIS — N186 End stage renal disease: Secondary | ICD-10-CM | POA: Diagnosis not present

## 2014-06-22 DIAGNOSIS — D631 Anemia in chronic kidney disease: Secondary | ICD-10-CM | POA: Diagnosis not present

## 2014-06-22 DIAGNOSIS — N2581 Secondary hyperparathyroidism of renal origin: Secondary | ICD-10-CM | POA: Diagnosis not present

## 2014-06-25 DIAGNOSIS — N2581 Secondary hyperparathyroidism of renal origin: Secondary | ICD-10-CM | POA: Diagnosis not present

## 2014-06-25 DIAGNOSIS — D631 Anemia in chronic kidney disease: Secondary | ICD-10-CM | POA: Diagnosis not present

## 2014-06-25 DIAGNOSIS — D509 Iron deficiency anemia, unspecified: Secondary | ICD-10-CM | POA: Diagnosis not present

## 2014-06-25 DIAGNOSIS — N186 End stage renal disease: Secondary | ICD-10-CM | POA: Diagnosis not present

## 2014-06-27 DIAGNOSIS — N2581 Secondary hyperparathyroidism of renal origin: Secondary | ICD-10-CM | POA: Diagnosis not present

## 2014-06-27 DIAGNOSIS — E119 Type 2 diabetes mellitus without complications: Secondary | ICD-10-CM | POA: Diagnosis not present

## 2014-06-27 DIAGNOSIS — D509 Iron deficiency anemia, unspecified: Secondary | ICD-10-CM | POA: Diagnosis not present

## 2014-06-27 DIAGNOSIS — D631 Anemia in chronic kidney disease: Secondary | ICD-10-CM | POA: Diagnosis not present

## 2014-06-27 DIAGNOSIS — M109 Gout, unspecified: Secondary | ICD-10-CM | POA: Diagnosis not present

## 2014-06-27 DIAGNOSIS — N186 End stage renal disease: Secondary | ICD-10-CM | POA: Diagnosis not present

## 2014-06-29 DIAGNOSIS — D509 Iron deficiency anemia, unspecified: Secondary | ICD-10-CM | POA: Diagnosis not present

## 2014-06-29 DIAGNOSIS — N186 End stage renal disease: Secondary | ICD-10-CM | POA: Diagnosis not present

## 2014-06-29 DIAGNOSIS — D631 Anemia in chronic kidney disease: Secondary | ICD-10-CM | POA: Diagnosis not present

## 2014-06-29 DIAGNOSIS — N2581 Secondary hyperparathyroidism of renal origin: Secondary | ICD-10-CM | POA: Diagnosis not present

## 2014-07-01 DIAGNOSIS — I519 Heart disease, unspecified: Secondary | ICD-10-CM | POA: Diagnosis not present

## 2014-07-01 DIAGNOSIS — I517 Cardiomegaly: Secondary | ICD-10-CM | POA: Diagnosis not present

## 2014-07-01 DIAGNOSIS — N186 End stage renal disease: Secondary | ICD-10-CM | POA: Diagnosis not present

## 2014-07-02 DIAGNOSIS — N186 End stage renal disease: Secondary | ICD-10-CM | POA: Diagnosis not present

## 2014-07-02 DIAGNOSIS — D509 Iron deficiency anemia, unspecified: Secondary | ICD-10-CM | POA: Diagnosis not present

## 2014-07-02 DIAGNOSIS — D631 Anemia in chronic kidney disease: Secondary | ICD-10-CM | POA: Diagnosis not present

## 2014-07-02 DIAGNOSIS — N2581 Secondary hyperparathyroidism of renal origin: Secondary | ICD-10-CM | POA: Diagnosis not present

## 2014-07-04 DIAGNOSIS — D631 Anemia in chronic kidney disease: Secondary | ICD-10-CM | POA: Diagnosis not present

## 2014-07-04 DIAGNOSIS — D509 Iron deficiency anemia, unspecified: Secondary | ICD-10-CM | POA: Diagnosis not present

## 2014-07-04 DIAGNOSIS — N2581 Secondary hyperparathyroidism of renal origin: Secondary | ICD-10-CM | POA: Diagnosis not present

## 2014-07-04 DIAGNOSIS — N186 End stage renal disease: Secondary | ICD-10-CM | POA: Diagnosis not present

## 2014-07-06 DIAGNOSIS — D509 Iron deficiency anemia, unspecified: Secondary | ICD-10-CM | POA: Diagnosis not present

## 2014-07-06 DIAGNOSIS — N186 End stage renal disease: Secondary | ICD-10-CM | POA: Diagnosis not present

## 2014-07-06 DIAGNOSIS — N2581 Secondary hyperparathyroidism of renal origin: Secondary | ICD-10-CM | POA: Diagnosis not present

## 2014-07-06 DIAGNOSIS — D631 Anemia in chronic kidney disease: Secondary | ICD-10-CM | POA: Diagnosis not present

## 2014-07-08 DIAGNOSIS — Z01818 Encounter for other preprocedural examination: Secondary | ICD-10-CM | POA: Diagnosis not present

## 2014-07-08 DIAGNOSIS — I429 Cardiomyopathy, unspecified: Secondary | ICD-10-CM | POA: Diagnosis not present

## 2014-07-09 ENCOUNTER — Telehealth: Payer: Self-pay | Admitting: Interventional Cardiology

## 2014-07-09 DIAGNOSIS — N2581 Secondary hyperparathyroidism of renal origin: Secondary | ICD-10-CM | POA: Diagnosis not present

## 2014-07-09 DIAGNOSIS — D509 Iron deficiency anemia, unspecified: Secondary | ICD-10-CM | POA: Diagnosis not present

## 2014-07-09 DIAGNOSIS — N186 End stage renal disease: Secondary | ICD-10-CM | POA: Diagnosis not present

## 2014-07-09 DIAGNOSIS — D631 Anemia in chronic kidney disease: Secondary | ICD-10-CM | POA: Diagnosis not present

## 2014-07-09 NOTE — Telephone Encounter (Signed)
Walk-In pt form "Echo" dropped off from [facility] gave to [employer]

## 2014-07-11 DIAGNOSIS — D509 Iron deficiency anemia, unspecified: Secondary | ICD-10-CM | POA: Diagnosis not present

## 2014-07-11 DIAGNOSIS — N186 End stage renal disease: Secondary | ICD-10-CM | POA: Diagnosis not present

## 2014-07-11 DIAGNOSIS — N2581 Secondary hyperparathyroidism of renal origin: Secondary | ICD-10-CM | POA: Diagnosis not present

## 2014-07-11 DIAGNOSIS — D631 Anemia in chronic kidney disease: Secondary | ICD-10-CM | POA: Diagnosis not present

## 2014-07-13 DIAGNOSIS — N186 End stage renal disease: Secondary | ICD-10-CM | POA: Diagnosis not present

## 2014-07-13 DIAGNOSIS — N2581 Secondary hyperparathyroidism of renal origin: Secondary | ICD-10-CM | POA: Diagnosis not present

## 2014-07-13 DIAGNOSIS — D509 Iron deficiency anemia, unspecified: Secondary | ICD-10-CM | POA: Diagnosis not present

## 2014-07-13 DIAGNOSIS — D631 Anemia in chronic kidney disease: Secondary | ICD-10-CM | POA: Diagnosis not present

## 2014-07-16 DIAGNOSIS — N2581 Secondary hyperparathyroidism of renal origin: Secondary | ICD-10-CM | POA: Diagnosis not present

## 2014-07-16 DIAGNOSIS — D631 Anemia in chronic kidney disease: Secondary | ICD-10-CM | POA: Diagnosis not present

## 2014-07-16 DIAGNOSIS — N186 End stage renal disease: Secondary | ICD-10-CM | POA: Diagnosis not present

## 2014-07-16 DIAGNOSIS — D509 Iron deficiency anemia, unspecified: Secondary | ICD-10-CM | POA: Diagnosis not present

## 2014-07-18 DIAGNOSIS — D509 Iron deficiency anemia, unspecified: Secondary | ICD-10-CM | POA: Diagnosis not present

## 2014-07-18 DIAGNOSIS — N186 End stage renal disease: Secondary | ICD-10-CM | POA: Diagnosis not present

## 2014-07-18 DIAGNOSIS — N2581 Secondary hyperparathyroidism of renal origin: Secondary | ICD-10-CM | POA: Diagnosis not present

## 2014-07-18 DIAGNOSIS — D631 Anemia in chronic kidney disease: Secondary | ICD-10-CM | POA: Diagnosis not present

## 2014-07-20 DIAGNOSIS — N186 End stage renal disease: Secondary | ICD-10-CM | POA: Diagnosis not present

## 2014-07-20 DIAGNOSIS — D509 Iron deficiency anemia, unspecified: Secondary | ICD-10-CM | POA: Diagnosis not present

## 2014-07-20 DIAGNOSIS — N2581 Secondary hyperparathyroidism of renal origin: Secondary | ICD-10-CM | POA: Diagnosis not present

## 2014-07-20 DIAGNOSIS — D631 Anemia in chronic kidney disease: Secondary | ICD-10-CM | POA: Diagnosis not present

## 2014-07-21 DIAGNOSIS — N186 End stage renal disease: Secondary | ICD-10-CM | POA: Diagnosis not present

## 2014-07-21 DIAGNOSIS — Z992 Dependence on renal dialysis: Secondary | ICD-10-CM | POA: Diagnosis not present

## 2014-07-23 DIAGNOSIS — N186 End stage renal disease: Secondary | ICD-10-CM | POA: Diagnosis not present

## 2014-07-23 DIAGNOSIS — N2581 Secondary hyperparathyroidism of renal origin: Secondary | ICD-10-CM | POA: Diagnosis not present

## 2014-07-23 DIAGNOSIS — D631 Anemia in chronic kidney disease: Secondary | ICD-10-CM | POA: Diagnosis not present

## 2014-07-25 DIAGNOSIS — N2581 Secondary hyperparathyroidism of renal origin: Secondary | ICD-10-CM | POA: Diagnosis not present

## 2014-07-25 DIAGNOSIS — N186 End stage renal disease: Secondary | ICD-10-CM | POA: Diagnosis not present

## 2014-07-25 DIAGNOSIS — D631 Anemia in chronic kidney disease: Secondary | ICD-10-CM | POA: Diagnosis not present

## 2014-07-25 DIAGNOSIS — E119 Type 2 diabetes mellitus without complications: Secondary | ICD-10-CM | POA: Diagnosis not present

## 2014-07-27 DIAGNOSIS — D631 Anemia in chronic kidney disease: Secondary | ICD-10-CM | POA: Diagnosis not present

## 2014-07-27 DIAGNOSIS — N2581 Secondary hyperparathyroidism of renal origin: Secondary | ICD-10-CM | POA: Diagnosis not present

## 2014-07-27 DIAGNOSIS — N186 End stage renal disease: Secondary | ICD-10-CM | POA: Diagnosis not present

## 2014-07-30 DIAGNOSIS — D631 Anemia in chronic kidney disease: Secondary | ICD-10-CM | POA: Diagnosis not present

## 2014-07-30 DIAGNOSIS — N2581 Secondary hyperparathyroidism of renal origin: Secondary | ICD-10-CM | POA: Diagnosis not present

## 2014-07-30 DIAGNOSIS — N186 End stage renal disease: Secondary | ICD-10-CM | POA: Diagnosis not present

## 2014-08-01 DIAGNOSIS — N2581 Secondary hyperparathyroidism of renal origin: Secondary | ICD-10-CM | POA: Diagnosis not present

## 2014-08-01 DIAGNOSIS — D631 Anemia in chronic kidney disease: Secondary | ICD-10-CM | POA: Diagnosis not present

## 2014-08-01 DIAGNOSIS — N186 End stage renal disease: Secondary | ICD-10-CM | POA: Diagnosis not present

## 2014-08-03 DIAGNOSIS — N186 End stage renal disease: Secondary | ICD-10-CM | POA: Diagnosis not present

## 2014-08-03 DIAGNOSIS — D631 Anemia in chronic kidney disease: Secondary | ICD-10-CM | POA: Diagnosis not present

## 2014-08-03 DIAGNOSIS — N2581 Secondary hyperparathyroidism of renal origin: Secondary | ICD-10-CM | POA: Diagnosis not present

## 2014-08-05 DIAGNOSIS — Z01810 Encounter for preprocedural cardiovascular examination: Secondary | ICD-10-CM | POA: Diagnosis not present

## 2014-08-06 DIAGNOSIS — N2581 Secondary hyperparathyroidism of renal origin: Secondary | ICD-10-CM | POA: Diagnosis not present

## 2014-08-06 DIAGNOSIS — D631 Anemia in chronic kidney disease: Secondary | ICD-10-CM | POA: Diagnosis not present

## 2014-08-06 DIAGNOSIS — N186 End stage renal disease: Secondary | ICD-10-CM | POA: Diagnosis not present

## 2014-08-08 DIAGNOSIS — D631 Anemia in chronic kidney disease: Secondary | ICD-10-CM | POA: Diagnosis not present

## 2014-08-08 DIAGNOSIS — N2581 Secondary hyperparathyroidism of renal origin: Secondary | ICD-10-CM | POA: Diagnosis not present

## 2014-08-08 DIAGNOSIS — N186 End stage renal disease: Secondary | ICD-10-CM | POA: Diagnosis not present

## 2014-08-10 DIAGNOSIS — D631 Anemia in chronic kidney disease: Secondary | ICD-10-CM | POA: Diagnosis not present

## 2014-08-10 DIAGNOSIS — N2581 Secondary hyperparathyroidism of renal origin: Secondary | ICD-10-CM | POA: Diagnosis not present

## 2014-08-10 DIAGNOSIS — N186 End stage renal disease: Secondary | ICD-10-CM | POA: Diagnosis not present

## 2014-08-13 DIAGNOSIS — N186 End stage renal disease: Secondary | ICD-10-CM | POA: Diagnosis not present

## 2014-08-13 DIAGNOSIS — D631 Anemia in chronic kidney disease: Secondary | ICD-10-CM | POA: Diagnosis not present

## 2014-08-13 DIAGNOSIS — N2581 Secondary hyperparathyroidism of renal origin: Secondary | ICD-10-CM | POA: Diagnosis not present

## 2014-08-15 DIAGNOSIS — D631 Anemia in chronic kidney disease: Secondary | ICD-10-CM | POA: Diagnosis not present

## 2014-08-15 DIAGNOSIS — N2581 Secondary hyperparathyroidism of renal origin: Secondary | ICD-10-CM | POA: Diagnosis not present

## 2014-08-15 DIAGNOSIS — N186 End stage renal disease: Secondary | ICD-10-CM | POA: Diagnosis not present

## 2014-08-16 DIAGNOSIS — E1165 Type 2 diabetes mellitus with hyperglycemia: Secondary | ICD-10-CM | POA: Diagnosis not present

## 2014-08-16 DIAGNOSIS — G609 Hereditary and idiopathic neuropathy, unspecified: Secondary | ICD-10-CM | POA: Diagnosis not present

## 2014-08-16 DIAGNOSIS — N186 End stage renal disease: Secondary | ICD-10-CM | POA: Diagnosis not present

## 2014-08-16 DIAGNOSIS — I10 Essential (primary) hypertension: Secondary | ICD-10-CM | POA: Diagnosis not present

## 2014-08-17 DIAGNOSIS — N186 End stage renal disease: Secondary | ICD-10-CM | POA: Diagnosis not present

## 2014-08-17 DIAGNOSIS — D631 Anemia in chronic kidney disease: Secondary | ICD-10-CM | POA: Diagnosis not present

## 2014-08-17 DIAGNOSIS — N2581 Secondary hyperparathyroidism of renal origin: Secondary | ICD-10-CM | POA: Diagnosis not present

## 2014-08-20 DIAGNOSIS — N2581 Secondary hyperparathyroidism of renal origin: Secondary | ICD-10-CM | POA: Diagnosis not present

## 2014-08-20 DIAGNOSIS — D631 Anemia in chronic kidney disease: Secondary | ICD-10-CM | POA: Diagnosis not present

## 2014-08-20 DIAGNOSIS — N186 End stage renal disease: Secondary | ICD-10-CM | POA: Diagnosis not present

## 2014-08-21 DIAGNOSIS — Z992 Dependence on renal dialysis: Secondary | ICD-10-CM | POA: Diagnosis not present

## 2014-08-21 DIAGNOSIS — N186 End stage renal disease: Secondary | ICD-10-CM | POA: Diagnosis not present

## 2014-08-22 DIAGNOSIS — D631 Anemia in chronic kidney disease: Secondary | ICD-10-CM | POA: Diagnosis not present

## 2014-08-22 DIAGNOSIS — N2581 Secondary hyperparathyroidism of renal origin: Secondary | ICD-10-CM | POA: Diagnosis not present

## 2014-08-22 DIAGNOSIS — E119 Type 2 diabetes mellitus without complications: Secondary | ICD-10-CM | POA: Diagnosis not present

## 2014-08-22 DIAGNOSIS — D509 Iron deficiency anemia, unspecified: Secondary | ICD-10-CM | POA: Diagnosis not present

## 2014-08-22 DIAGNOSIS — N186 End stage renal disease: Secondary | ICD-10-CM | POA: Diagnosis not present

## 2014-08-24 DIAGNOSIS — N2581 Secondary hyperparathyroidism of renal origin: Secondary | ICD-10-CM | POA: Diagnosis not present

## 2014-08-24 DIAGNOSIS — D509 Iron deficiency anemia, unspecified: Secondary | ICD-10-CM | POA: Diagnosis not present

## 2014-08-24 DIAGNOSIS — D631 Anemia in chronic kidney disease: Secondary | ICD-10-CM | POA: Diagnosis not present

## 2014-08-24 DIAGNOSIS — N186 End stage renal disease: Secondary | ICD-10-CM | POA: Diagnosis not present

## 2014-08-27 DIAGNOSIS — N186 End stage renal disease: Secondary | ICD-10-CM | POA: Diagnosis not present

## 2014-08-27 DIAGNOSIS — D631 Anemia in chronic kidney disease: Secondary | ICD-10-CM | POA: Diagnosis not present

## 2014-08-27 DIAGNOSIS — N2581 Secondary hyperparathyroidism of renal origin: Secondary | ICD-10-CM | POA: Diagnosis not present

## 2014-08-27 DIAGNOSIS — D509 Iron deficiency anemia, unspecified: Secondary | ICD-10-CM | POA: Diagnosis not present

## 2014-08-28 DIAGNOSIS — N186 End stage renal disease: Secondary | ICD-10-CM | POA: Diagnosis not present

## 2014-08-29 DIAGNOSIS — N2581 Secondary hyperparathyroidism of renal origin: Secondary | ICD-10-CM | POA: Diagnosis not present

## 2014-08-29 DIAGNOSIS — D631 Anemia in chronic kidney disease: Secondary | ICD-10-CM | POA: Diagnosis not present

## 2014-08-29 DIAGNOSIS — N186 End stage renal disease: Secondary | ICD-10-CM | POA: Diagnosis not present

## 2014-08-29 DIAGNOSIS — D509 Iron deficiency anemia, unspecified: Secondary | ICD-10-CM | POA: Diagnosis not present

## 2014-08-31 DIAGNOSIS — D509 Iron deficiency anemia, unspecified: Secondary | ICD-10-CM | POA: Diagnosis not present

## 2014-08-31 DIAGNOSIS — N2581 Secondary hyperparathyroidism of renal origin: Secondary | ICD-10-CM | POA: Diagnosis not present

## 2014-08-31 DIAGNOSIS — N186 End stage renal disease: Secondary | ICD-10-CM | POA: Diagnosis not present

## 2014-08-31 DIAGNOSIS — D631 Anemia in chronic kidney disease: Secondary | ICD-10-CM | POA: Diagnosis not present

## 2014-09-03 DIAGNOSIS — D631 Anemia in chronic kidney disease: Secondary | ICD-10-CM | POA: Diagnosis not present

## 2014-09-03 DIAGNOSIS — D509 Iron deficiency anemia, unspecified: Secondary | ICD-10-CM | POA: Diagnosis not present

## 2014-09-03 DIAGNOSIS — N2581 Secondary hyperparathyroidism of renal origin: Secondary | ICD-10-CM | POA: Diagnosis not present

## 2014-09-03 DIAGNOSIS — N186 End stage renal disease: Secondary | ICD-10-CM | POA: Diagnosis not present

## 2014-09-05 DIAGNOSIS — N2581 Secondary hyperparathyroidism of renal origin: Secondary | ICD-10-CM | POA: Diagnosis not present

## 2014-09-05 DIAGNOSIS — D509 Iron deficiency anemia, unspecified: Secondary | ICD-10-CM | POA: Diagnosis not present

## 2014-09-05 DIAGNOSIS — D631 Anemia in chronic kidney disease: Secondary | ICD-10-CM | POA: Diagnosis not present

## 2014-09-05 DIAGNOSIS — N186 End stage renal disease: Secondary | ICD-10-CM | POA: Diagnosis not present

## 2014-09-07 DIAGNOSIS — N186 End stage renal disease: Secondary | ICD-10-CM | POA: Diagnosis not present

## 2014-09-07 DIAGNOSIS — D631 Anemia in chronic kidney disease: Secondary | ICD-10-CM | POA: Diagnosis not present

## 2014-09-07 DIAGNOSIS — D509 Iron deficiency anemia, unspecified: Secondary | ICD-10-CM | POA: Diagnosis not present

## 2014-09-07 DIAGNOSIS — N2581 Secondary hyperparathyroidism of renal origin: Secondary | ICD-10-CM | POA: Diagnosis not present

## 2014-09-10 DIAGNOSIS — D631 Anemia in chronic kidney disease: Secondary | ICD-10-CM | POA: Diagnosis not present

## 2014-09-10 DIAGNOSIS — D509 Iron deficiency anemia, unspecified: Secondary | ICD-10-CM | POA: Diagnosis not present

## 2014-09-10 DIAGNOSIS — N2581 Secondary hyperparathyroidism of renal origin: Secondary | ICD-10-CM | POA: Diagnosis not present

## 2014-09-10 DIAGNOSIS — N186 End stage renal disease: Secondary | ICD-10-CM | POA: Diagnosis not present

## 2014-09-12 DIAGNOSIS — N2581 Secondary hyperparathyroidism of renal origin: Secondary | ICD-10-CM | POA: Diagnosis not present

## 2014-09-12 DIAGNOSIS — D631 Anemia in chronic kidney disease: Secondary | ICD-10-CM | POA: Diagnosis not present

## 2014-09-12 DIAGNOSIS — D509 Iron deficiency anemia, unspecified: Secondary | ICD-10-CM | POA: Diagnosis not present

## 2014-09-12 DIAGNOSIS — N186 End stage renal disease: Secondary | ICD-10-CM | POA: Diagnosis not present

## 2014-09-14 DIAGNOSIS — D631 Anemia in chronic kidney disease: Secondary | ICD-10-CM | POA: Diagnosis not present

## 2014-09-14 DIAGNOSIS — N186 End stage renal disease: Secondary | ICD-10-CM | POA: Diagnosis not present

## 2014-09-14 DIAGNOSIS — N2581 Secondary hyperparathyroidism of renal origin: Secondary | ICD-10-CM | POA: Diagnosis not present

## 2014-09-14 DIAGNOSIS — D509 Iron deficiency anemia, unspecified: Secondary | ICD-10-CM | POA: Diagnosis not present

## 2014-09-17 DIAGNOSIS — N2581 Secondary hyperparathyroidism of renal origin: Secondary | ICD-10-CM | POA: Diagnosis not present

## 2014-09-17 DIAGNOSIS — D631 Anemia in chronic kidney disease: Secondary | ICD-10-CM | POA: Diagnosis not present

## 2014-09-17 DIAGNOSIS — N186 End stage renal disease: Secondary | ICD-10-CM | POA: Diagnosis not present

## 2014-09-17 DIAGNOSIS — D509 Iron deficiency anemia, unspecified: Secondary | ICD-10-CM | POA: Diagnosis not present

## 2014-09-19 DIAGNOSIS — D631 Anemia in chronic kidney disease: Secondary | ICD-10-CM | POA: Diagnosis not present

## 2014-09-19 DIAGNOSIS — N186 End stage renal disease: Secondary | ICD-10-CM | POA: Diagnosis not present

## 2014-09-19 DIAGNOSIS — D509 Iron deficiency anemia, unspecified: Secondary | ICD-10-CM | POA: Diagnosis not present

## 2014-09-19 DIAGNOSIS — N2581 Secondary hyperparathyroidism of renal origin: Secondary | ICD-10-CM | POA: Diagnosis not present

## 2014-09-20 DIAGNOSIS — N186 End stage renal disease: Secondary | ICD-10-CM | POA: Diagnosis not present

## 2014-09-20 DIAGNOSIS — Z992 Dependence on renal dialysis: Secondary | ICD-10-CM | POA: Diagnosis not present

## 2014-09-21 DIAGNOSIS — D631 Anemia in chronic kidney disease: Secondary | ICD-10-CM | POA: Diagnosis not present

## 2014-09-21 DIAGNOSIS — N2581 Secondary hyperparathyroidism of renal origin: Secondary | ICD-10-CM | POA: Diagnosis not present

## 2014-09-21 DIAGNOSIS — N186 End stage renal disease: Secondary | ICD-10-CM | POA: Diagnosis not present

## 2014-09-21 DIAGNOSIS — D509 Iron deficiency anemia, unspecified: Secondary | ICD-10-CM | POA: Diagnosis not present

## 2014-09-24 DIAGNOSIS — N2581 Secondary hyperparathyroidism of renal origin: Secondary | ICD-10-CM | POA: Diagnosis not present

## 2014-09-24 DIAGNOSIS — D631 Anemia in chronic kidney disease: Secondary | ICD-10-CM | POA: Diagnosis not present

## 2014-09-24 DIAGNOSIS — D509 Iron deficiency anemia, unspecified: Secondary | ICD-10-CM | POA: Diagnosis not present

## 2014-09-24 DIAGNOSIS — N186 End stage renal disease: Secondary | ICD-10-CM | POA: Diagnosis not present

## 2014-09-26 DIAGNOSIS — D631 Anemia in chronic kidney disease: Secondary | ICD-10-CM | POA: Diagnosis not present

## 2014-09-26 DIAGNOSIS — E119 Type 2 diabetes mellitus without complications: Secondary | ICD-10-CM | POA: Diagnosis not present

## 2014-09-26 DIAGNOSIS — N186 End stage renal disease: Secondary | ICD-10-CM | POA: Diagnosis not present

## 2014-09-26 DIAGNOSIS — N2581 Secondary hyperparathyroidism of renal origin: Secondary | ICD-10-CM | POA: Diagnosis not present

## 2014-09-26 DIAGNOSIS — D509 Iron deficiency anemia, unspecified: Secondary | ICD-10-CM | POA: Diagnosis not present

## 2014-09-28 DIAGNOSIS — N2581 Secondary hyperparathyroidism of renal origin: Secondary | ICD-10-CM | POA: Diagnosis not present

## 2014-09-28 DIAGNOSIS — D631 Anemia in chronic kidney disease: Secondary | ICD-10-CM | POA: Diagnosis not present

## 2014-09-28 DIAGNOSIS — D509 Iron deficiency anemia, unspecified: Secondary | ICD-10-CM | POA: Diagnosis not present

## 2014-09-28 DIAGNOSIS — N186 End stage renal disease: Secondary | ICD-10-CM | POA: Diagnosis not present

## 2014-10-01 DIAGNOSIS — N2581 Secondary hyperparathyroidism of renal origin: Secondary | ICD-10-CM | POA: Diagnosis not present

## 2014-10-01 DIAGNOSIS — D631 Anemia in chronic kidney disease: Secondary | ICD-10-CM | POA: Diagnosis not present

## 2014-10-01 DIAGNOSIS — D509 Iron deficiency anemia, unspecified: Secondary | ICD-10-CM | POA: Diagnosis not present

## 2014-10-01 DIAGNOSIS — N186 End stage renal disease: Secondary | ICD-10-CM | POA: Diagnosis not present

## 2014-10-03 DIAGNOSIS — N2581 Secondary hyperparathyroidism of renal origin: Secondary | ICD-10-CM | POA: Diagnosis not present

## 2014-10-03 DIAGNOSIS — D631 Anemia in chronic kidney disease: Secondary | ICD-10-CM | POA: Diagnosis not present

## 2014-10-03 DIAGNOSIS — D509 Iron deficiency anemia, unspecified: Secondary | ICD-10-CM | POA: Diagnosis not present

## 2014-10-03 DIAGNOSIS — N186 End stage renal disease: Secondary | ICD-10-CM | POA: Diagnosis not present

## 2014-10-05 DIAGNOSIS — D631 Anemia in chronic kidney disease: Secondary | ICD-10-CM | POA: Diagnosis not present

## 2014-10-05 DIAGNOSIS — N2581 Secondary hyperparathyroidism of renal origin: Secondary | ICD-10-CM | POA: Diagnosis not present

## 2014-10-05 DIAGNOSIS — D509 Iron deficiency anemia, unspecified: Secondary | ICD-10-CM | POA: Diagnosis not present

## 2014-10-05 DIAGNOSIS — N186 End stage renal disease: Secondary | ICD-10-CM | POA: Diagnosis not present

## 2014-10-07 ENCOUNTER — Ambulatory Visit (INDEPENDENT_AMBULATORY_CARE_PROVIDER_SITE_OTHER): Payer: Medicare Other | Admitting: Ophthalmology

## 2014-10-08 DIAGNOSIS — N2581 Secondary hyperparathyroidism of renal origin: Secondary | ICD-10-CM | POA: Diagnosis not present

## 2014-10-08 DIAGNOSIS — D509 Iron deficiency anemia, unspecified: Secondary | ICD-10-CM | POA: Diagnosis not present

## 2014-10-08 DIAGNOSIS — N186 End stage renal disease: Secondary | ICD-10-CM | POA: Diagnosis not present

## 2014-10-08 DIAGNOSIS — D631 Anemia in chronic kidney disease: Secondary | ICD-10-CM | POA: Diagnosis not present

## 2014-10-10 DIAGNOSIS — N186 End stage renal disease: Secondary | ICD-10-CM | POA: Diagnosis not present

## 2014-10-10 DIAGNOSIS — N2581 Secondary hyperparathyroidism of renal origin: Secondary | ICD-10-CM | POA: Diagnosis not present

## 2014-10-10 DIAGNOSIS — D509 Iron deficiency anemia, unspecified: Secondary | ICD-10-CM | POA: Diagnosis not present

## 2014-10-10 DIAGNOSIS — D631 Anemia in chronic kidney disease: Secondary | ICD-10-CM | POA: Diagnosis not present

## 2014-10-12 DIAGNOSIS — N186 End stage renal disease: Secondary | ICD-10-CM | POA: Diagnosis not present

## 2014-10-12 DIAGNOSIS — D509 Iron deficiency anemia, unspecified: Secondary | ICD-10-CM | POA: Diagnosis not present

## 2014-10-12 DIAGNOSIS — D631 Anemia in chronic kidney disease: Secondary | ICD-10-CM | POA: Diagnosis not present

## 2014-10-12 DIAGNOSIS — N2581 Secondary hyperparathyroidism of renal origin: Secondary | ICD-10-CM | POA: Diagnosis not present

## 2014-10-15 DIAGNOSIS — N2581 Secondary hyperparathyroidism of renal origin: Secondary | ICD-10-CM | POA: Diagnosis not present

## 2014-10-15 DIAGNOSIS — D631 Anemia in chronic kidney disease: Secondary | ICD-10-CM | POA: Diagnosis not present

## 2014-10-15 DIAGNOSIS — D509 Iron deficiency anemia, unspecified: Secondary | ICD-10-CM | POA: Diagnosis not present

## 2014-10-15 DIAGNOSIS — N186 End stage renal disease: Secondary | ICD-10-CM | POA: Diagnosis not present

## 2014-10-16 ENCOUNTER — Ambulatory Visit (INDEPENDENT_AMBULATORY_CARE_PROVIDER_SITE_OTHER): Payer: Medicare Other | Admitting: Ophthalmology

## 2014-10-16 DIAGNOSIS — E11351 Type 2 diabetes mellitus with proliferative diabetic retinopathy with macular edema: Secondary | ICD-10-CM | POA: Diagnosis not present

## 2014-10-16 DIAGNOSIS — E11311 Type 2 diabetes mellitus with unspecified diabetic retinopathy with macular edema: Secondary | ICD-10-CM

## 2014-10-16 DIAGNOSIS — H43813 Vitreous degeneration, bilateral: Secondary | ICD-10-CM | POA: Diagnosis not present

## 2014-10-17 DIAGNOSIS — N186 End stage renal disease: Secondary | ICD-10-CM | POA: Diagnosis not present

## 2014-10-17 DIAGNOSIS — D631 Anemia in chronic kidney disease: Secondary | ICD-10-CM | POA: Diagnosis not present

## 2014-10-17 DIAGNOSIS — N2581 Secondary hyperparathyroidism of renal origin: Secondary | ICD-10-CM | POA: Diagnosis not present

## 2014-10-17 DIAGNOSIS — D509 Iron deficiency anemia, unspecified: Secondary | ICD-10-CM | POA: Diagnosis not present

## 2014-10-18 DIAGNOSIS — H4011X4 Primary open-angle glaucoma, indeterminate stage: Secondary | ICD-10-CM | POA: Diagnosis not present

## 2014-10-19 DIAGNOSIS — N2581 Secondary hyperparathyroidism of renal origin: Secondary | ICD-10-CM | POA: Diagnosis not present

## 2014-10-19 DIAGNOSIS — D631 Anemia in chronic kidney disease: Secondary | ICD-10-CM | POA: Diagnosis not present

## 2014-10-19 DIAGNOSIS — D509 Iron deficiency anemia, unspecified: Secondary | ICD-10-CM | POA: Diagnosis not present

## 2014-10-19 DIAGNOSIS — N186 End stage renal disease: Secondary | ICD-10-CM | POA: Diagnosis not present

## 2014-10-21 DIAGNOSIS — N186 End stage renal disease: Secondary | ICD-10-CM | POA: Diagnosis not present

## 2014-10-21 DIAGNOSIS — Z992 Dependence on renal dialysis: Secondary | ICD-10-CM | POA: Diagnosis not present

## 2014-10-22 DIAGNOSIS — N2581 Secondary hyperparathyroidism of renal origin: Secondary | ICD-10-CM | POA: Diagnosis not present

## 2014-10-22 DIAGNOSIS — D631 Anemia in chronic kidney disease: Secondary | ICD-10-CM | POA: Diagnosis not present

## 2014-10-22 DIAGNOSIS — D509 Iron deficiency anemia, unspecified: Secondary | ICD-10-CM | POA: Diagnosis not present

## 2014-10-22 DIAGNOSIS — N186 End stage renal disease: Secondary | ICD-10-CM | POA: Diagnosis not present

## 2014-10-24 DIAGNOSIS — D631 Anemia in chronic kidney disease: Secondary | ICD-10-CM | POA: Diagnosis not present

## 2014-10-24 DIAGNOSIS — E119 Type 2 diabetes mellitus without complications: Secondary | ICD-10-CM | POA: Diagnosis not present

## 2014-10-24 DIAGNOSIS — N2581 Secondary hyperparathyroidism of renal origin: Secondary | ICD-10-CM | POA: Diagnosis not present

## 2014-10-24 DIAGNOSIS — D509 Iron deficiency anemia, unspecified: Secondary | ICD-10-CM | POA: Diagnosis not present

## 2014-10-24 DIAGNOSIS — N186 End stage renal disease: Secondary | ICD-10-CM | POA: Diagnosis not present

## 2014-10-26 DIAGNOSIS — N186 End stage renal disease: Secondary | ICD-10-CM | POA: Diagnosis not present

## 2014-10-26 DIAGNOSIS — D631 Anemia in chronic kidney disease: Secondary | ICD-10-CM | POA: Diagnosis not present

## 2014-10-26 DIAGNOSIS — N2581 Secondary hyperparathyroidism of renal origin: Secondary | ICD-10-CM | POA: Diagnosis not present

## 2014-10-26 DIAGNOSIS — D509 Iron deficiency anemia, unspecified: Secondary | ICD-10-CM | POA: Diagnosis not present

## 2014-10-29 DIAGNOSIS — N2581 Secondary hyperparathyroidism of renal origin: Secondary | ICD-10-CM | POA: Diagnosis not present

## 2014-10-29 DIAGNOSIS — D509 Iron deficiency anemia, unspecified: Secondary | ICD-10-CM | POA: Diagnosis not present

## 2014-10-29 DIAGNOSIS — D631 Anemia in chronic kidney disease: Secondary | ICD-10-CM | POA: Diagnosis not present

## 2014-10-29 DIAGNOSIS — N186 End stage renal disease: Secondary | ICD-10-CM | POA: Diagnosis not present

## 2014-10-31 DIAGNOSIS — D631 Anemia in chronic kidney disease: Secondary | ICD-10-CM | POA: Diagnosis not present

## 2014-10-31 DIAGNOSIS — D509 Iron deficiency anemia, unspecified: Secondary | ICD-10-CM | POA: Diagnosis not present

## 2014-10-31 DIAGNOSIS — N2581 Secondary hyperparathyroidism of renal origin: Secondary | ICD-10-CM | POA: Diagnosis not present

## 2014-10-31 DIAGNOSIS — N186 End stage renal disease: Secondary | ICD-10-CM | POA: Diagnosis not present

## 2014-11-02 DIAGNOSIS — N2581 Secondary hyperparathyroidism of renal origin: Secondary | ICD-10-CM | POA: Diagnosis not present

## 2014-11-02 DIAGNOSIS — D631 Anemia in chronic kidney disease: Secondary | ICD-10-CM | POA: Diagnosis not present

## 2014-11-02 DIAGNOSIS — N186 End stage renal disease: Secondary | ICD-10-CM | POA: Diagnosis not present

## 2014-11-02 DIAGNOSIS — D509 Iron deficiency anemia, unspecified: Secondary | ICD-10-CM | POA: Diagnosis not present

## 2014-11-05 DIAGNOSIS — D509 Iron deficiency anemia, unspecified: Secondary | ICD-10-CM | POA: Diagnosis not present

## 2014-11-05 DIAGNOSIS — N186 End stage renal disease: Secondary | ICD-10-CM | POA: Diagnosis not present

## 2014-11-05 DIAGNOSIS — D631 Anemia in chronic kidney disease: Secondary | ICD-10-CM | POA: Diagnosis not present

## 2014-11-05 DIAGNOSIS — N2581 Secondary hyperparathyroidism of renal origin: Secondary | ICD-10-CM | POA: Diagnosis not present

## 2014-11-07 DIAGNOSIS — D631 Anemia in chronic kidney disease: Secondary | ICD-10-CM | POA: Diagnosis not present

## 2014-11-07 DIAGNOSIS — N186 End stage renal disease: Secondary | ICD-10-CM | POA: Diagnosis not present

## 2014-11-07 DIAGNOSIS — D509 Iron deficiency anemia, unspecified: Secondary | ICD-10-CM | POA: Diagnosis not present

## 2014-11-07 DIAGNOSIS — N2581 Secondary hyperparathyroidism of renal origin: Secondary | ICD-10-CM | POA: Diagnosis not present

## 2014-11-09 DIAGNOSIS — N186 End stage renal disease: Secondary | ICD-10-CM | POA: Diagnosis not present

## 2014-11-09 DIAGNOSIS — D631 Anemia in chronic kidney disease: Secondary | ICD-10-CM | POA: Diagnosis not present

## 2014-11-09 DIAGNOSIS — N2581 Secondary hyperparathyroidism of renal origin: Secondary | ICD-10-CM | POA: Diagnosis not present

## 2014-11-09 DIAGNOSIS — D509 Iron deficiency anemia, unspecified: Secondary | ICD-10-CM | POA: Diagnosis not present

## 2014-11-12 DIAGNOSIS — N2581 Secondary hyperparathyroidism of renal origin: Secondary | ICD-10-CM | POA: Diagnosis not present

## 2014-11-12 DIAGNOSIS — D509 Iron deficiency anemia, unspecified: Secondary | ICD-10-CM | POA: Diagnosis not present

## 2014-11-12 DIAGNOSIS — N186 End stage renal disease: Secondary | ICD-10-CM | POA: Diagnosis not present

## 2014-11-12 DIAGNOSIS — D631 Anemia in chronic kidney disease: Secondary | ICD-10-CM | POA: Diagnosis not present

## 2014-11-14 DIAGNOSIS — D631 Anemia in chronic kidney disease: Secondary | ICD-10-CM | POA: Diagnosis not present

## 2014-11-14 DIAGNOSIS — D509 Iron deficiency anemia, unspecified: Secondary | ICD-10-CM | POA: Diagnosis not present

## 2014-11-14 DIAGNOSIS — N2581 Secondary hyperparathyroidism of renal origin: Secondary | ICD-10-CM | POA: Diagnosis not present

## 2014-11-14 DIAGNOSIS — N186 End stage renal disease: Secondary | ICD-10-CM | POA: Diagnosis not present

## 2014-11-16 DIAGNOSIS — D509 Iron deficiency anemia, unspecified: Secondary | ICD-10-CM | POA: Diagnosis not present

## 2014-11-16 DIAGNOSIS — N186 End stage renal disease: Secondary | ICD-10-CM | POA: Diagnosis not present

## 2014-11-16 DIAGNOSIS — N2581 Secondary hyperparathyroidism of renal origin: Secondary | ICD-10-CM | POA: Diagnosis not present

## 2014-11-16 DIAGNOSIS — D631 Anemia in chronic kidney disease: Secondary | ICD-10-CM | POA: Diagnosis not present

## 2014-11-19 DIAGNOSIS — D509 Iron deficiency anemia, unspecified: Secondary | ICD-10-CM | POA: Diagnosis not present

## 2014-11-19 DIAGNOSIS — D631 Anemia in chronic kidney disease: Secondary | ICD-10-CM | POA: Diagnosis not present

## 2014-11-19 DIAGNOSIS — N186 End stage renal disease: Secondary | ICD-10-CM | POA: Diagnosis not present

## 2014-11-19 DIAGNOSIS — N2581 Secondary hyperparathyroidism of renal origin: Secondary | ICD-10-CM | POA: Diagnosis not present

## 2014-11-21 DIAGNOSIS — N2581 Secondary hyperparathyroidism of renal origin: Secondary | ICD-10-CM | POA: Diagnosis not present

## 2014-11-21 DIAGNOSIS — N186 End stage renal disease: Secondary | ICD-10-CM | POA: Diagnosis not present

## 2014-11-21 DIAGNOSIS — D631 Anemia in chronic kidney disease: Secondary | ICD-10-CM | POA: Diagnosis not present

## 2014-11-21 DIAGNOSIS — Z992 Dependence on renal dialysis: Secondary | ICD-10-CM | POA: Diagnosis not present

## 2014-11-21 DIAGNOSIS — D509 Iron deficiency anemia, unspecified: Secondary | ICD-10-CM | POA: Diagnosis not present

## 2014-11-23 DIAGNOSIS — N186 End stage renal disease: Secondary | ICD-10-CM | POA: Diagnosis not present

## 2014-11-23 DIAGNOSIS — D509 Iron deficiency anemia, unspecified: Secondary | ICD-10-CM | POA: Diagnosis not present

## 2014-11-23 DIAGNOSIS — N2581 Secondary hyperparathyroidism of renal origin: Secondary | ICD-10-CM | POA: Diagnosis not present

## 2014-11-23 DIAGNOSIS — D631 Anemia in chronic kidney disease: Secondary | ICD-10-CM | POA: Diagnosis not present

## 2014-11-26 DIAGNOSIS — N186 End stage renal disease: Secondary | ICD-10-CM | POA: Diagnosis not present

## 2014-11-26 DIAGNOSIS — N2581 Secondary hyperparathyroidism of renal origin: Secondary | ICD-10-CM | POA: Diagnosis not present

## 2014-11-26 DIAGNOSIS — D509 Iron deficiency anemia, unspecified: Secondary | ICD-10-CM | POA: Diagnosis not present

## 2014-11-26 DIAGNOSIS — D631 Anemia in chronic kidney disease: Secondary | ICD-10-CM | POA: Diagnosis not present

## 2014-11-28 DIAGNOSIS — N186 End stage renal disease: Secondary | ICD-10-CM | POA: Diagnosis not present

## 2014-11-28 DIAGNOSIS — D631 Anemia in chronic kidney disease: Secondary | ICD-10-CM | POA: Diagnosis not present

## 2014-11-28 DIAGNOSIS — N2581 Secondary hyperparathyroidism of renal origin: Secondary | ICD-10-CM | POA: Diagnosis not present

## 2014-11-28 DIAGNOSIS — E119 Type 2 diabetes mellitus without complications: Secondary | ICD-10-CM | POA: Diagnosis not present

## 2014-11-28 DIAGNOSIS — D509 Iron deficiency anemia, unspecified: Secondary | ICD-10-CM | POA: Diagnosis not present

## 2014-11-30 DIAGNOSIS — N2581 Secondary hyperparathyroidism of renal origin: Secondary | ICD-10-CM | POA: Diagnosis not present

## 2014-11-30 DIAGNOSIS — D509 Iron deficiency anemia, unspecified: Secondary | ICD-10-CM | POA: Diagnosis not present

## 2014-11-30 DIAGNOSIS — N186 End stage renal disease: Secondary | ICD-10-CM | POA: Diagnosis not present

## 2014-11-30 DIAGNOSIS — D631 Anemia in chronic kidney disease: Secondary | ICD-10-CM | POA: Diagnosis not present

## 2014-12-03 DIAGNOSIS — N186 End stage renal disease: Secondary | ICD-10-CM | POA: Diagnosis not present

## 2014-12-03 DIAGNOSIS — N2581 Secondary hyperparathyroidism of renal origin: Secondary | ICD-10-CM | POA: Diagnosis not present

## 2014-12-03 DIAGNOSIS — D631 Anemia in chronic kidney disease: Secondary | ICD-10-CM | POA: Diagnosis not present

## 2014-12-03 DIAGNOSIS — D509 Iron deficiency anemia, unspecified: Secondary | ICD-10-CM | POA: Diagnosis not present

## 2014-12-05 DIAGNOSIS — D631 Anemia in chronic kidney disease: Secondary | ICD-10-CM | POA: Diagnosis not present

## 2014-12-05 DIAGNOSIS — N186 End stage renal disease: Secondary | ICD-10-CM | POA: Diagnosis not present

## 2014-12-05 DIAGNOSIS — D509 Iron deficiency anemia, unspecified: Secondary | ICD-10-CM | POA: Diagnosis not present

## 2014-12-05 DIAGNOSIS — N2581 Secondary hyperparathyroidism of renal origin: Secondary | ICD-10-CM | POA: Diagnosis not present

## 2014-12-07 DIAGNOSIS — D509 Iron deficiency anemia, unspecified: Secondary | ICD-10-CM | POA: Diagnosis not present

## 2014-12-07 DIAGNOSIS — N2581 Secondary hyperparathyroidism of renal origin: Secondary | ICD-10-CM | POA: Diagnosis not present

## 2014-12-07 DIAGNOSIS — D631 Anemia in chronic kidney disease: Secondary | ICD-10-CM | POA: Diagnosis not present

## 2014-12-07 DIAGNOSIS — N186 End stage renal disease: Secondary | ICD-10-CM | POA: Diagnosis not present

## 2014-12-10 DIAGNOSIS — N2581 Secondary hyperparathyroidism of renal origin: Secondary | ICD-10-CM | POA: Diagnosis not present

## 2014-12-10 DIAGNOSIS — N186 End stage renal disease: Secondary | ICD-10-CM | POA: Diagnosis not present

## 2014-12-10 DIAGNOSIS — D509 Iron deficiency anemia, unspecified: Secondary | ICD-10-CM | POA: Diagnosis not present

## 2014-12-10 DIAGNOSIS — D631 Anemia in chronic kidney disease: Secondary | ICD-10-CM | POA: Diagnosis not present

## 2014-12-12 DIAGNOSIS — N2581 Secondary hyperparathyroidism of renal origin: Secondary | ICD-10-CM | POA: Diagnosis not present

## 2014-12-12 DIAGNOSIS — N186 End stage renal disease: Secondary | ICD-10-CM | POA: Diagnosis not present

## 2014-12-12 DIAGNOSIS — D631 Anemia in chronic kidney disease: Secondary | ICD-10-CM | POA: Diagnosis not present

## 2014-12-12 DIAGNOSIS — D509 Iron deficiency anemia, unspecified: Secondary | ICD-10-CM | POA: Diagnosis not present

## 2014-12-14 DIAGNOSIS — N186 End stage renal disease: Secondary | ICD-10-CM | POA: Diagnosis not present

## 2014-12-14 DIAGNOSIS — D631 Anemia in chronic kidney disease: Secondary | ICD-10-CM | POA: Diagnosis not present

## 2014-12-14 DIAGNOSIS — D509 Iron deficiency anemia, unspecified: Secondary | ICD-10-CM | POA: Diagnosis not present

## 2014-12-14 DIAGNOSIS — N2581 Secondary hyperparathyroidism of renal origin: Secondary | ICD-10-CM | POA: Diagnosis not present

## 2014-12-17 DIAGNOSIS — D509 Iron deficiency anemia, unspecified: Secondary | ICD-10-CM | POA: Diagnosis not present

## 2014-12-17 DIAGNOSIS — N186 End stage renal disease: Secondary | ICD-10-CM | POA: Diagnosis not present

## 2014-12-17 DIAGNOSIS — D631 Anemia in chronic kidney disease: Secondary | ICD-10-CM | POA: Diagnosis not present

## 2014-12-17 DIAGNOSIS — N2581 Secondary hyperparathyroidism of renal origin: Secondary | ICD-10-CM | POA: Diagnosis not present

## 2014-12-19 DIAGNOSIS — D631 Anemia in chronic kidney disease: Secondary | ICD-10-CM | POA: Diagnosis not present

## 2014-12-19 DIAGNOSIS — N186 End stage renal disease: Secondary | ICD-10-CM | POA: Diagnosis not present

## 2014-12-19 DIAGNOSIS — N2581 Secondary hyperparathyroidism of renal origin: Secondary | ICD-10-CM | POA: Diagnosis not present

## 2014-12-19 DIAGNOSIS — D509 Iron deficiency anemia, unspecified: Secondary | ICD-10-CM | POA: Diagnosis not present

## 2014-12-21 DIAGNOSIS — N2581 Secondary hyperparathyroidism of renal origin: Secondary | ICD-10-CM | POA: Diagnosis not present

## 2014-12-21 DIAGNOSIS — N186 End stage renal disease: Secondary | ICD-10-CM | POA: Diagnosis not present

## 2014-12-21 DIAGNOSIS — Z992 Dependence on renal dialysis: Secondary | ICD-10-CM | POA: Diagnosis not present

## 2014-12-21 DIAGNOSIS — Z23 Encounter for immunization: Secondary | ICD-10-CM | POA: Diagnosis not present

## 2014-12-21 DIAGNOSIS — D631 Anemia in chronic kidney disease: Secondary | ICD-10-CM | POA: Diagnosis not present

## 2014-12-24 DIAGNOSIS — N2581 Secondary hyperparathyroidism of renal origin: Secondary | ICD-10-CM | POA: Diagnosis not present

## 2014-12-24 DIAGNOSIS — N186 End stage renal disease: Secondary | ICD-10-CM | POA: Diagnosis not present

## 2014-12-24 DIAGNOSIS — Z23 Encounter for immunization: Secondary | ICD-10-CM | POA: Diagnosis not present

## 2014-12-24 DIAGNOSIS — D631 Anemia in chronic kidney disease: Secondary | ICD-10-CM | POA: Diagnosis not present

## 2014-12-26 DIAGNOSIS — E119 Type 2 diabetes mellitus without complications: Secondary | ICD-10-CM | POA: Diagnosis not present

## 2014-12-26 DIAGNOSIS — Z23 Encounter for immunization: Secondary | ICD-10-CM | POA: Diagnosis not present

## 2014-12-26 DIAGNOSIS — D631 Anemia in chronic kidney disease: Secondary | ICD-10-CM | POA: Diagnosis not present

## 2014-12-26 DIAGNOSIS — N2581 Secondary hyperparathyroidism of renal origin: Secondary | ICD-10-CM | POA: Diagnosis not present

## 2014-12-26 DIAGNOSIS — M109 Gout, unspecified: Secondary | ICD-10-CM | POA: Diagnosis not present

## 2014-12-26 DIAGNOSIS — N186 End stage renal disease: Secondary | ICD-10-CM | POA: Diagnosis not present

## 2014-12-28 DIAGNOSIS — Z23 Encounter for immunization: Secondary | ICD-10-CM | POA: Diagnosis not present

## 2014-12-28 DIAGNOSIS — N2581 Secondary hyperparathyroidism of renal origin: Secondary | ICD-10-CM | POA: Diagnosis not present

## 2014-12-28 DIAGNOSIS — N186 End stage renal disease: Secondary | ICD-10-CM | POA: Diagnosis not present

## 2014-12-28 DIAGNOSIS — D631 Anemia in chronic kidney disease: Secondary | ICD-10-CM | POA: Diagnosis not present

## 2014-12-31 DIAGNOSIS — N186 End stage renal disease: Secondary | ICD-10-CM | POA: Diagnosis not present

## 2014-12-31 DIAGNOSIS — N2581 Secondary hyperparathyroidism of renal origin: Secondary | ICD-10-CM | POA: Diagnosis not present

## 2014-12-31 DIAGNOSIS — Z23 Encounter for immunization: Secondary | ICD-10-CM | POA: Diagnosis not present

## 2014-12-31 DIAGNOSIS — D631 Anemia in chronic kidney disease: Secondary | ICD-10-CM | POA: Diagnosis not present

## 2015-01-02 DIAGNOSIS — Z23 Encounter for immunization: Secondary | ICD-10-CM | POA: Diagnosis not present

## 2015-01-02 DIAGNOSIS — N186 End stage renal disease: Secondary | ICD-10-CM | POA: Diagnosis not present

## 2015-01-02 DIAGNOSIS — N2581 Secondary hyperparathyroidism of renal origin: Secondary | ICD-10-CM | POA: Diagnosis not present

## 2015-01-02 DIAGNOSIS — D631 Anemia in chronic kidney disease: Secondary | ICD-10-CM | POA: Diagnosis not present

## 2015-01-04 DIAGNOSIS — N2581 Secondary hyperparathyroidism of renal origin: Secondary | ICD-10-CM | POA: Diagnosis not present

## 2015-01-04 DIAGNOSIS — N186 End stage renal disease: Secondary | ICD-10-CM | POA: Diagnosis not present

## 2015-01-04 DIAGNOSIS — Z23 Encounter for immunization: Secondary | ICD-10-CM | POA: Diagnosis not present

## 2015-01-04 DIAGNOSIS — D631 Anemia in chronic kidney disease: Secondary | ICD-10-CM | POA: Diagnosis not present

## 2015-01-07 DIAGNOSIS — D631 Anemia in chronic kidney disease: Secondary | ICD-10-CM | POA: Diagnosis not present

## 2015-01-07 DIAGNOSIS — N186 End stage renal disease: Secondary | ICD-10-CM | POA: Diagnosis not present

## 2015-01-07 DIAGNOSIS — N2581 Secondary hyperparathyroidism of renal origin: Secondary | ICD-10-CM | POA: Diagnosis not present

## 2015-01-07 DIAGNOSIS — Z23 Encounter for immunization: Secondary | ICD-10-CM | POA: Diagnosis not present

## 2015-01-09 DIAGNOSIS — D631 Anemia in chronic kidney disease: Secondary | ICD-10-CM | POA: Diagnosis not present

## 2015-01-09 DIAGNOSIS — Z23 Encounter for immunization: Secondary | ICD-10-CM | POA: Diagnosis not present

## 2015-01-09 DIAGNOSIS — N2581 Secondary hyperparathyroidism of renal origin: Secondary | ICD-10-CM | POA: Diagnosis not present

## 2015-01-09 DIAGNOSIS — N186 End stage renal disease: Secondary | ICD-10-CM | POA: Diagnosis not present

## 2015-01-11 DIAGNOSIS — D631 Anemia in chronic kidney disease: Secondary | ICD-10-CM | POA: Diagnosis not present

## 2015-01-11 DIAGNOSIS — Z23 Encounter for immunization: Secondary | ICD-10-CM | POA: Diagnosis not present

## 2015-01-11 DIAGNOSIS — N2581 Secondary hyperparathyroidism of renal origin: Secondary | ICD-10-CM | POA: Diagnosis not present

## 2015-01-11 DIAGNOSIS — N186 End stage renal disease: Secondary | ICD-10-CM | POA: Diagnosis not present

## 2015-01-14 DIAGNOSIS — D631 Anemia in chronic kidney disease: Secondary | ICD-10-CM | POA: Diagnosis not present

## 2015-01-14 DIAGNOSIS — N2581 Secondary hyperparathyroidism of renal origin: Secondary | ICD-10-CM | POA: Diagnosis not present

## 2015-01-14 DIAGNOSIS — N186 End stage renal disease: Secondary | ICD-10-CM | POA: Diagnosis not present

## 2015-01-14 DIAGNOSIS — Z23 Encounter for immunization: Secondary | ICD-10-CM | POA: Diagnosis not present

## 2015-01-16 DIAGNOSIS — Z23 Encounter for immunization: Secondary | ICD-10-CM | POA: Diagnosis not present

## 2015-01-16 DIAGNOSIS — N186 End stage renal disease: Secondary | ICD-10-CM | POA: Diagnosis not present

## 2015-01-16 DIAGNOSIS — D631 Anemia in chronic kidney disease: Secondary | ICD-10-CM | POA: Diagnosis not present

## 2015-01-16 DIAGNOSIS — N2581 Secondary hyperparathyroidism of renal origin: Secondary | ICD-10-CM | POA: Diagnosis not present

## 2015-01-18 DIAGNOSIS — D631 Anemia in chronic kidney disease: Secondary | ICD-10-CM | POA: Diagnosis not present

## 2015-01-18 DIAGNOSIS — Z23 Encounter for immunization: Secondary | ICD-10-CM | POA: Diagnosis not present

## 2015-01-18 DIAGNOSIS — N2581 Secondary hyperparathyroidism of renal origin: Secondary | ICD-10-CM | POA: Diagnosis not present

## 2015-01-18 DIAGNOSIS — N186 End stage renal disease: Secondary | ICD-10-CM | POA: Diagnosis not present

## 2015-01-21 DIAGNOSIS — D631 Anemia in chronic kidney disease: Secondary | ICD-10-CM | POA: Diagnosis not present

## 2015-01-21 DIAGNOSIS — N186 End stage renal disease: Secondary | ICD-10-CM | POA: Diagnosis not present

## 2015-01-21 DIAGNOSIS — D509 Iron deficiency anemia, unspecified: Secondary | ICD-10-CM | POA: Diagnosis not present

## 2015-01-21 DIAGNOSIS — N2581 Secondary hyperparathyroidism of renal origin: Secondary | ICD-10-CM | POA: Diagnosis not present

## 2015-01-21 DIAGNOSIS — Z23 Encounter for immunization: Secondary | ICD-10-CM | POA: Diagnosis not present

## 2015-01-21 DIAGNOSIS — Z992 Dependence on renal dialysis: Secondary | ICD-10-CM | POA: Diagnosis not present

## 2015-01-23 DIAGNOSIS — N2581 Secondary hyperparathyroidism of renal origin: Secondary | ICD-10-CM | POA: Diagnosis not present

## 2015-01-23 DIAGNOSIS — N186 End stage renal disease: Secondary | ICD-10-CM | POA: Diagnosis not present

## 2015-01-23 DIAGNOSIS — Z23 Encounter for immunization: Secondary | ICD-10-CM | POA: Diagnosis not present

## 2015-01-23 DIAGNOSIS — E119 Type 2 diabetes mellitus without complications: Secondary | ICD-10-CM | POA: Diagnosis not present

## 2015-01-23 DIAGNOSIS — D631 Anemia in chronic kidney disease: Secondary | ICD-10-CM | POA: Diagnosis not present

## 2015-01-23 DIAGNOSIS — D509 Iron deficiency anemia, unspecified: Secondary | ICD-10-CM | POA: Diagnosis not present

## 2015-01-25 DIAGNOSIS — D509 Iron deficiency anemia, unspecified: Secondary | ICD-10-CM | POA: Diagnosis not present

## 2015-01-25 DIAGNOSIS — N186 End stage renal disease: Secondary | ICD-10-CM | POA: Diagnosis not present

## 2015-01-25 DIAGNOSIS — Z23 Encounter for immunization: Secondary | ICD-10-CM | POA: Diagnosis not present

## 2015-01-25 DIAGNOSIS — N2581 Secondary hyperparathyroidism of renal origin: Secondary | ICD-10-CM | POA: Diagnosis not present

## 2015-01-25 DIAGNOSIS — D631 Anemia in chronic kidney disease: Secondary | ICD-10-CM | POA: Diagnosis not present

## 2015-01-27 DIAGNOSIS — Z1211 Encounter for screening for malignant neoplasm of colon: Secondary | ICD-10-CM | POA: Diagnosis not present

## 2015-01-27 DIAGNOSIS — I5030 Unspecified diastolic (congestive) heart failure: Secondary | ICD-10-CM | POA: Diagnosis not present

## 2015-01-27 DIAGNOSIS — Z0001 Encounter for general adult medical examination with abnormal findings: Secondary | ICD-10-CM | POA: Diagnosis not present

## 2015-01-27 DIAGNOSIS — E669 Obesity, unspecified: Secondary | ICD-10-CM | POA: Diagnosis not present

## 2015-01-27 DIAGNOSIS — E1121 Type 2 diabetes mellitus with diabetic nephropathy: Secondary | ICD-10-CM | POA: Diagnosis not present

## 2015-01-27 DIAGNOSIS — K5900 Constipation, unspecified: Secondary | ICD-10-CM | POA: Diagnosis not present

## 2015-01-27 DIAGNOSIS — E1149 Type 2 diabetes mellitus with other diabetic neurological complication: Secondary | ICD-10-CM | POA: Diagnosis not present

## 2015-01-27 DIAGNOSIS — K648 Other hemorrhoids: Secondary | ICD-10-CM | POA: Diagnosis not present

## 2015-01-27 DIAGNOSIS — M1990 Unspecified osteoarthritis, unspecified site: Secondary | ICD-10-CM | POA: Diagnosis not present

## 2015-01-27 DIAGNOSIS — E7800 Pure hypercholesterolemia, unspecified: Secondary | ICD-10-CM | POA: Diagnosis not present

## 2015-01-27 DIAGNOSIS — Z79899 Other long term (current) drug therapy: Secondary | ICD-10-CM | POA: Diagnosis not present

## 2015-01-28 DIAGNOSIS — Z23 Encounter for immunization: Secondary | ICD-10-CM | POA: Diagnosis not present

## 2015-01-28 DIAGNOSIS — D509 Iron deficiency anemia, unspecified: Secondary | ICD-10-CM | POA: Diagnosis not present

## 2015-01-28 DIAGNOSIS — N186 End stage renal disease: Secondary | ICD-10-CM | POA: Diagnosis not present

## 2015-01-28 DIAGNOSIS — N2581 Secondary hyperparathyroidism of renal origin: Secondary | ICD-10-CM | POA: Diagnosis not present

## 2015-01-28 DIAGNOSIS — D631 Anemia in chronic kidney disease: Secondary | ICD-10-CM | POA: Diagnosis not present

## 2015-01-30 DIAGNOSIS — D509 Iron deficiency anemia, unspecified: Secondary | ICD-10-CM | POA: Diagnosis not present

## 2015-01-30 DIAGNOSIS — Z23 Encounter for immunization: Secondary | ICD-10-CM | POA: Diagnosis not present

## 2015-01-30 DIAGNOSIS — D631 Anemia in chronic kidney disease: Secondary | ICD-10-CM | POA: Diagnosis not present

## 2015-01-30 DIAGNOSIS — N2581 Secondary hyperparathyroidism of renal origin: Secondary | ICD-10-CM | POA: Diagnosis not present

## 2015-01-30 DIAGNOSIS — N186 End stage renal disease: Secondary | ICD-10-CM | POA: Diagnosis not present

## 2015-02-01 ENCOUNTER — Emergency Department (HOSPITAL_COMMUNITY)
Admission: EM | Admit: 2015-02-01 | Discharge: 2015-02-02 | Discharge status: Against Medical Advice | Payer: Medicare Other | Attending: Emergency Medicine | Admitting: Emergency Medicine

## 2015-02-01 ENCOUNTER — Encounter (HOSPITAL_COMMUNITY): Payer: Self-pay | Admitting: Emergency Medicine

## 2015-02-01 DIAGNOSIS — N189 Chronic kidney disease, unspecified: Secondary | ICD-10-CM | POA: Diagnosis not present

## 2015-02-01 DIAGNOSIS — D631 Anemia in chronic kidney disease: Secondary | ICD-10-CM | POA: Diagnosis not present

## 2015-02-01 DIAGNOSIS — N2581 Secondary hyperparathyroidism of renal origin: Secondary | ICD-10-CM | POA: Diagnosis not present

## 2015-02-01 DIAGNOSIS — K5900 Constipation, unspecified: Secondary | ICD-10-CM | POA: Diagnosis not present

## 2015-02-01 DIAGNOSIS — I129 Hypertensive chronic kidney disease with stage 1 through stage 4 chronic kidney disease, or unspecified chronic kidney disease: Secondary | ICD-10-CM | POA: Insufficient documentation

## 2015-02-01 DIAGNOSIS — Z7982 Long term (current) use of aspirin: Secondary | ICD-10-CM | POA: Insufficient documentation

## 2015-02-01 DIAGNOSIS — Z79899 Other long term (current) drug therapy: Secondary | ICD-10-CM | POA: Diagnosis not present

## 2015-02-01 DIAGNOSIS — R238 Other skin changes: Secondary | ICD-10-CM

## 2015-02-01 DIAGNOSIS — Z8669 Personal history of other diseases of the nervous system and sense organs: Secondary | ICD-10-CM | POA: Diagnosis not present

## 2015-02-01 DIAGNOSIS — Z794 Long term (current) use of insulin: Secondary | ICD-10-CM | POA: Diagnosis not present

## 2015-02-01 DIAGNOSIS — K6289 Other specified diseases of anus and rectum: Secondary | ICD-10-CM | POA: Diagnosis present

## 2015-02-01 DIAGNOSIS — E119 Type 2 diabetes mellitus without complications: Secondary | ICD-10-CM | POA: Insufficient documentation

## 2015-02-01 DIAGNOSIS — I509 Heart failure, unspecified: Secondary | ICD-10-CM | POA: Diagnosis not present

## 2015-02-01 DIAGNOSIS — D509 Iron deficiency anemia, unspecified: Secondary | ICD-10-CM | POA: Diagnosis not present

## 2015-02-01 DIAGNOSIS — L988 Other specified disorders of the skin and subcutaneous tissue: Secondary | ICD-10-CM | POA: Insufficient documentation

## 2015-02-01 DIAGNOSIS — L909 Atrophic disorder of skin, unspecified: Secondary | ICD-10-CM

## 2015-02-01 DIAGNOSIS — N186 End stage renal disease: Secondary | ICD-10-CM | POA: Diagnosis not present

## 2015-02-01 DIAGNOSIS — Z23 Encounter for immunization: Secondary | ICD-10-CM | POA: Diagnosis not present

## 2015-02-01 DIAGNOSIS — K648 Other hemorrhoids: Secondary | ICD-10-CM | POA: Diagnosis not present

## 2015-02-01 DIAGNOSIS — K644 Residual hemorrhoidal skin tags: Secondary | ICD-10-CM | POA: Diagnosis not present

## 2015-02-01 NOTE — ED Notes (Signed)
Pt from home c/o hemorrhoids and has been followed by PCP. He reports current treatments are ineffective and it is difficult to sit.

## 2015-02-02 ENCOUNTER — Encounter (HOSPITAL_COMMUNITY): Payer: Self-pay | Admitting: Emergency Medicine

## 2015-02-02 MED ORDER — HYDROCORTISONE ACE-PRAMOXINE 1-1 % RE FOAM: 1.0000 | Freq: Two times a day (BID) | RECTAL | Status: DC

## 2015-02-02 MED ORDER — VITAMINS A & D EX OINT: 1.0000 "application " | TOPICAL_OINTMENT | CUTANEOUS | Status: DC | PRN

## 2015-02-02 NOTE — ED Notes (Signed)
Pt verbalized that his systolic BP's are normally in the 86's or 87's and that his nephrologist is aware of it.  Pt further stated that he was informed that further action is necessary only when his systolic BP is below 85.

## 2015-02-02 NOTE — ED Provider Notes (Signed)
CSN: [id number]     Arrival date & time 02/01/15  2231 History  By signing my name below, I, Evelene Croon, attest that this documentation has been prepared under the direction and in the presence of Caridad Silveira, MD . Electronically Signed: Evelene Croon, Scribe. 02/02/2015. 1:04 AM.    Chief Complaint  Patient presents with  . Hemorrhoids    Patient is a 49 y.o. male presenting with constipation. The history is provided by the patient. No language interpreter was used.  Constipation Severity:  Moderate Timing:  Intermittent Progression:  Unchanged Chronicity:  Chronic Context: not dehydration   Stool description:  Hard Relieved by:  Nothing Worsened by:  Nothing tried Ineffective treatments:  None tried Associated symptoms: no abdominal pain and no vomiting   Associated symptoms comment:  Patient reports hemorrhoids Risk factors: no change in medication    HPI Comments:  [name] [name] is a 49 y.o. male with a history of DM, CKD, and HTN, who presents to the Emergency Department complaining of rectal pain for ~ 2 weeks secondary to hemorrhoids. He has been wiping with preparation H wipes without relief. He also saw PCP and was given dulcolax and linzess for constipation which he has also taken without relief. Pt has no other complaints at this time.    Past Medical History  Diagnosis Date  . Diabetes mellitus without complication ([place])   . Chronic kidney disease   . Neuromuscular disorder ([place])   . CHF (congestive heart failure) ([place])   . Hypertension    Past Surgical History  Procedure Laterality Date  . Appendectomy     History reviewed. No pertinent family history. Social History  Substance Use Topics  . Smoking status: Never Smoker   . Smokeless tobacco: None  . Alcohol Use: No    Review of Systems  Respiratory: Negative for cough and shortness of breath.   Cardiovascular: Negative for chest pain.  Gastrointestinal: Positive for constipation and rectal  pain. Negative for vomiting, abdominal pain, blood in stool and anal bleeding.  All other systems reviewed and are negative.  Allergies  Review of patient's allergies indicates no known allergies.  Home Medications   Prior to Admission medications   Medication Sig Start Date End Date Taking? Authorizing Provider  aspirin 81 MG tablet Take 81 mg by mouth daily.    Historical Provider, MD  Calcium Acetate, Phos Binder, 667 MG CAPS Take 2 capsules by mouth 3 (three) times daily with meals.  06/02/10   Historical Provider, MD  carvedilol (COREG) 6.25 MG tablet TAKE 1 TABLET (6.25 MG TOTAL) BY MOUTH 2 (TWO) TIMES DAILY WITH A MEAL. 12/24/13   [name], MD  carvedilol (COREG) 6.25 MG tablet TAKE 1 TABLET (6.25 MG TOTAL) BY MOUTH 2 (TWO) TIMES DAILY WITH A MEAL. 05/20/14   [name], MD  cinacalcet (SENSIPAR) 60 MG tablet Take 60 mg by mouth daily.    Historical Provider, MD  clonazePAM (KLONOPIN) 0.5 MG tablet Take 0.5 mg by mouth at bedtime.    Historical Provider, MD  febuxostat (ULORIC) 40 MG tablet Take 80 mg by mouth daily.    Historical Provider, MD  gabapentin (NEURONTIN) 300 MG capsule Take 300 mg by mouth 3 (three) times daily.    Historical Provider, MD  Insulin Aspart Prot & Aspart (NOVOLOG MIX 70/30 PENFILL Bruce) Inject 30 Units into the skin 2 (two) times daily after a meal.    Historical Provider, MD  isosorbide-hydrALAZINE (BIDIL) 20-37.5 MG per  tablet Take one tablet on the evenings you do not have dialysis.  Do not take on dialysis days 05/03/14   [name], MD  Multiple Vitamins-Minerals (MEGA MULTIVITAMIN FOR MEN PO) Take by mouth.    Historical Provider, MD  sevelamer carbonate (RENVELA) 800 MG tablet Take 800 mg by mouth 3 (three) times daily with meals.    Historical Provider, MD  Timolol Maleate (ISTALOL) 0.5 % (DAILY) SOLN Apply 1 drop to eye every morning.    Historical Provider, MD   BP 87/49 mmHg  Pulse 96  Temp(Src) 97.9 F (36.6 C) (Oral)  Resp 16  SpO2  93% Physical Exam  Constitutional: He is oriented to person, place, and time. He appears well-developed and well-nourished. No distress.  HENT:  Head: Normocephalic and atraumatic.  Mouth/Throat: Oropharynx is clear and moist. No oropharyngeal exudate.  Moist mucous membranes   Eyes: Conjunctivae are normal. Pupils are equal, round, and reactive to light.  Neck: Normal range of motion. Neck supple. No JVD present.  Trachea midline  Cardiovascular: Normal rate, regular rhythm and normal heart sounds.   Pulmonary/Chest: Effort normal and breath sounds normal. No respiratory distress. He has no wheezes. He has no rales.  Abdominal: Soft. Bowel sounds are normal. He exhibits no distension. There is no tenderness. There is no rebound and no guarding.  Genitourinary:  2 Non thrombosed internal hemorrhoids noted, as well as external excoriations  Chaperone (ED Tech) was present for exam which was performed with no discomfort or complications.   Musculoskeletal: Normal range of motion.  Neurological: He is alert and oriented to person, place, and time. He has normal reflexes.  Skin: Skin is warm and dry.  Psychiatric: He has a normal mood and affect. His behavior is normal.  Nursing note and vitals reviewed.   ED Course  Procedures   DIAGNOSTIC STUDIES:  Oxygen Saturation is 100% on RA, normal by my interpretation.    COORDINATION OF CARE:  12:22 AM Discussed treatment plan with pt at bedside and pt agreed to plan.    MDM   Final diagnoses:  None    A/d Ointment to the bottom, miralax BID and follow up with your GI specialist  EDP wants patient to stay as BP is low but patient reports BP is always this low and he is still supposed to take his BP medication.  Patient is AO4 and has decision making capacity to refuse care.    I personally performed the services described in this documentation, which was scribed in my presence. The recorded information has been reviewed and is  accurate.      Veatrice Kells, MD 02/02/15 [id number]

## 2015-02-04 DIAGNOSIS — N186 End stage renal disease: Secondary | ICD-10-CM | POA: Diagnosis not present

## 2015-02-04 DIAGNOSIS — N2581 Secondary hyperparathyroidism of renal origin: Secondary | ICD-10-CM | POA: Diagnosis not present

## 2015-02-04 DIAGNOSIS — Z23 Encounter for immunization: Secondary | ICD-10-CM | POA: Diagnosis not present

## 2015-02-04 DIAGNOSIS — D509 Iron deficiency anemia, unspecified: Secondary | ICD-10-CM | POA: Diagnosis not present

## 2015-02-04 DIAGNOSIS — D631 Anemia in chronic kidney disease: Secondary | ICD-10-CM | POA: Diagnosis not present

## 2015-02-06 DIAGNOSIS — N186 End stage renal disease: Secondary | ICD-10-CM | POA: Diagnosis not present

## 2015-02-06 DIAGNOSIS — N2581 Secondary hyperparathyroidism of renal origin: Secondary | ICD-10-CM | POA: Diagnosis not present

## 2015-02-06 DIAGNOSIS — D631 Anemia in chronic kidney disease: Secondary | ICD-10-CM | POA: Diagnosis not present

## 2015-02-06 DIAGNOSIS — D509 Iron deficiency anemia, unspecified: Secondary | ICD-10-CM | POA: Diagnosis not present

## 2015-02-06 DIAGNOSIS — Z23 Encounter for immunization: Secondary | ICD-10-CM | POA: Diagnosis not present

## 2015-02-08 DIAGNOSIS — D631 Anemia in chronic kidney disease: Secondary | ICD-10-CM | POA: Diagnosis not present

## 2015-02-08 DIAGNOSIS — N186 End stage renal disease: Secondary | ICD-10-CM | POA: Diagnosis not present

## 2015-02-08 DIAGNOSIS — N2581 Secondary hyperparathyroidism of renal origin: Secondary | ICD-10-CM | POA: Diagnosis not present

## 2015-02-08 DIAGNOSIS — Z23 Encounter for immunization: Secondary | ICD-10-CM | POA: Diagnosis not present

## 2015-02-08 DIAGNOSIS — D509 Iron deficiency anemia, unspecified: Secondary | ICD-10-CM | POA: Diagnosis not present

## 2015-02-10 DIAGNOSIS — Z23 Encounter for immunization: Secondary | ICD-10-CM | POA: Diagnosis not present

## 2015-02-10 DIAGNOSIS — N2581 Secondary hyperparathyroidism of renal origin: Secondary | ICD-10-CM | POA: Diagnosis not present

## 2015-02-10 DIAGNOSIS — D509 Iron deficiency anemia, unspecified: Secondary | ICD-10-CM | POA: Diagnosis not present

## 2015-02-10 DIAGNOSIS — N186 End stage renal disease: Secondary | ICD-10-CM | POA: Diagnosis not present

## 2015-02-10 DIAGNOSIS — D631 Anemia in chronic kidney disease: Secondary | ICD-10-CM | POA: Diagnosis not present

## 2015-02-12 DIAGNOSIS — N2581 Secondary hyperparathyroidism of renal origin: Secondary | ICD-10-CM | POA: Diagnosis not present

## 2015-02-12 DIAGNOSIS — D631 Anemia in chronic kidney disease: Secondary | ICD-10-CM | POA: Diagnosis not present

## 2015-02-12 DIAGNOSIS — N186 End stage renal disease: Secondary | ICD-10-CM | POA: Diagnosis not present

## 2015-02-12 DIAGNOSIS — Z23 Encounter for immunization: Secondary | ICD-10-CM | POA: Diagnosis not present

## 2015-02-12 DIAGNOSIS — D509 Iron deficiency anemia, unspecified: Secondary | ICD-10-CM | POA: Diagnosis not present

## 2015-02-15 DIAGNOSIS — Z23 Encounter for immunization: Secondary | ICD-10-CM | POA: Diagnosis not present

## 2015-02-15 DIAGNOSIS — N186 End stage renal disease: Secondary | ICD-10-CM | POA: Diagnosis not present

## 2015-02-15 DIAGNOSIS — D509 Iron deficiency anemia, unspecified: Secondary | ICD-10-CM | POA: Diagnosis not present

## 2015-02-15 DIAGNOSIS — N2581 Secondary hyperparathyroidism of renal origin: Secondary | ICD-10-CM | POA: Diagnosis not present

## 2015-02-15 DIAGNOSIS — D631 Anemia in chronic kidney disease: Secondary | ICD-10-CM | POA: Diagnosis not present

## 2015-02-18 DIAGNOSIS — N2581 Secondary hyperparathyroidism of renal origin: Secondary | ICD-10-CM | POA: Diagnosis not present

## 2015-02-18 DIAGNOSIS — D509 Iron deficiency anemia, unspecified: Secondary | ICD-10-CM | POA: Diagnosis not present

## 2015-02-18 DIAGNOSIS — N186 End stage renal disease: Secondary | ICD-10-CM | POA: Diagnosis not present

## 2015-02-18 DIAGNOSIS — D631 Anemia in chronic kidney disease: Secondary | ICD-10-CM | POA: Diagnosis not present

## 2015-02-18 DIAGNOSIS — Z23 Encounter for immunization: Secondary | ICD-10-CM | POA: Diagnosis not present

## 2015-02-20 DIAGNOSIS — Z992 Dependence on renal dialysis: Secondary | ICD-10-CM | POA: Diagnosis not present

## 2015-02-20 DIAGNOSIS — D631 Anemia in chronic kidney disease: Secondary | ICD-10-CM | POA: Diagnosis not present

## 2015-02-20 DIAGNOSIS — N186 End stage renal disease: Secondary | ICD-10-CM | POA: Diagnosis not present

## 2015-02-20 DIAGNOSIS — N2581 Secondary hyperparathyroidism of renal origin: Secondary | ICD-10-CM | POA: Diagnosis not present

## 2015-02-22 DIAGNOSIS — D631 Anemia in chronic kidney disease: Secondary | ICD-10-CM | POA: Diagnosis not present

## 2015-02-22 DIAGNOSIS — N186 End stage renal disease: Secondary | ICD-10-CM | POA: Diagnosis not present

## 2015-02-22 DIAGNOSIS — N2581 Secondary hyperparathyroidism of renal origin: Secondary | ICD-10-CM | POA: Diagnosis not present

## 2015-02-25 DIAGNOSIS — N2581 Secondary hyperparathyroidism of renal origin: Secondary | ICD-10-CM | POA: Diagnosis not present

## 2015-02-25 DIAGNOSIS — N186 End stage renal disease: Secondary | ICD-10-CM | POA: Diagnosis not present

## 2015-02-25 DIAGNOSIS — D631 Anemia in chronic kidney disease: Secondary | ICD-10-CM | POA: Diagnosis not present

## 2015-02-27 DIAGNOSIS — N186 End stage renal disease: Secondary | ICD-10-CM | POA: Diagnosis not present

## 2015-02-27 DIAGNOSIS — N2581 Secondary hyperparathyroidism of renal origin: Secondary | ICD-10-CM | POA: Diagnosis not present

## 2015-02-27 DIAGNOSIS — Z114 Encounter for screening for human immunodeficiency virus [HIV]: Secondary | ICD-10-CM | POA: Diagnosis not present

## 2015-02-27 DIAGNOSIS — D631 Anemia in chronic kidney disease: Secondary | ICD-10-CM | POA: Diagnosis not present

## 2015-02-27 DIAGNOSIS — E119 Type 2 diabetes mellitus without complications: Secondary | ICD-10-CM | POA: Diagnosis not present

## 2015-03-01 DIAGNOSIS — D631 Anemia in chronic kidney disease: Secondary | ICD-10-CM | POA: Diagnosis not present

## 2015-03-01 DIAGNOSIS — N186 End stage renal disease: Secondary | ICD-10-CM | POA: Diagnosis not present

## 2015-03-01 DIAGNOSIS — N2581 Secondary hyperparathyroidism of renal origin: Secondary | ICD-10-CM | POA: Diagnosis not present

## 2015-03-03 ENCOUNTER — Other Ambulatory Visit: Payer: Self-pay | Admitting: General Surgery

## 2015-03-03 DIAGNOSIS — K603 Anal fistula: Secondary | ICD-10-CM | POA: Diagnosis not present

## 2015-03-03 NOTE — H&P (Signed)
[name] [name] 03/03/2015 9:07 AM Location: [facility] Patient #: [id number] DOB: 01-04-1966 Single / Language: [name] [name] / Race: Undefined Male  History of Present Illness [name] Ruff MD; Q000111Q 9:46 AM) Patient words: Hemorrhoids.  The patient is a 49 year old male who presents with anal pain. 49yo M with 3 month h/o anal pain with BM's. The pain is sharp in nature during BM's. He has a h/o constipation and hard stools but uses Dulcolax on a weekly basis for this. He gets quite a bit of fiber in his diet. He is a dialysis pt and thinks the medications he takes for this causes him to have hard stools.   Other Problems [name] Leventhal, RN, BSN; 03/03/2015 9:07 AM) Chronic Renal Failure Syndrome Diabetes Mellitus Gastroesophageal Reflux Disease Other disease, cancer, significant illness  Past Surgical History [name] Leventhal, RN, BSN; 03/03/2015 9:07 AM) Appendectomy Dialysis Shunt / Fistula  Diagnostic Studies History [name] Leventhal, RN, BSN; 03/03/2015 9:07 AM) Colonoscopy never  Allergies [name] Leventhal, RN, BSN; 03/03/2015 9:09 AM) No Known Drug Allergies 03/03/2015  Medication History [name] Leventhal, RN, BSN; 03/03/2015 9:12 AM) [name] [name] (1 [id number] MG(Fe) Tablet, Oral daily) Active. Calcium Acetate (Phos Binder) (667MG  Capsule, Oral daily) Active. Carvedilol (6.25MG  Tablet, Oral daily) Active. Gabapentin (300MG  Capsule, Oral daily) Active. Lipitor (10MG  Tablet, Oral daily) Active. Renvela (800MG  Tablet, Oral daily) Active. Sensipar (60MG  Tablet, Oral daily) Active. Uloric (40MG  Tablet, Oral daily) Active. Istalol (0.5% Solution, Ophthalmic daily) Active. Aspirin (81MG  Tablet Chewable, Oral daily) Active. NovoLOG (100UNIT/ML Solution, Subcutaneous daily) Active. B-12 (100MCG Tablet, Oral daily) Active. Medications Reconciled  Social History ([name] Leventhal, RN, BSN; 03/03/2015 9:07 AM) Alcohol use Remotely quit  alcohol use. Caffeine use Carbonated beverages, Coffee. No drug use Tobacco use Never smoker.  Family History ([name] Leventhal, RN, BSN; 03/03/2015 9:07 AM) Diabetes Mellitus Brother, Father. Heart Disease Brother. Heart disease in male family member before age 71 Kidney Disease Father.     Review of Systems Occupational hygienist, BSN; 03/03/2015 9:07 AM) General Not Present- Appetite Loss, Chills, Fatigue, Fever, Night Sweats, Weight Gain and Weight Loss. Skin Present- Dryness. Not Present- Change in Wart/Mole, Hives, Jaundice, New Lesions, Non-Healing Wounds, Rash and Ulcer. HEENT Present- Wears glasses/contact lenses. Not Present- Earache, Hearing Loss, Hoarseness, Nose Bleed, Oral Ulcers, Ringing in the Ears, Seasonal Allergies, Sinus Pain, Sore Throat, Visual Disturbances and Yellow Eyes. Respiratory Not Present- Bloody sputum, Chronic Cough, Difficulty Breathing, Snoring and Wheezing. Breast Not Present- Breast Mass, Breast Pain, Nipple Discharge and Skin Changes. Cardiovascular Not Present- Chest Pain, Difficulty Breathing Lying Down, Leg Cramps, Palpitations, Rapid Heart Rate, Shortness of Breath and Swelling of Extremities. Gastrointestinal Present- Chronic diarrhea, Constipation, Hemorrhoids and Rectal Pain. Not Present- Abdominal Pain, Bloating, Bloody Stool, Change in Bowel Habits, Difficulty Swallowing, Excessive gas, Gets full quickly at meals, Indigestion, Nausea and Vomiting. Male Genitourinary Not Present- Blood in Urine, Change in Urinary Stream, Frequency, Impotence, Nocturia, Painful Urination, Urgency and Urine Leakage. Musculoskeletal Present- Joint Pain. Not Present- Back Pain, Joint Stiffness, Muscle Pain, Muscle Weakness and Swelling of Extremities. Neurological Present- Numbness and Tingling. Not Present- Decreased Memory, Fainting, Headaches, Seizures, Tremor, Trouble walking and Weakness. Psychiatric Not Present- Anxiety, Bipolar, Change in Sleep Pattern,  Depression, Fearful and Frequent crying. Endocrine Not Present- Cold Intolerance, Excessive Hunger, Hair Changes, Heat Intolerance, Hot flashes and New Diabetes. Hematology Not Present- Easy Bruising, Excessive bleeding, Gland problems, HIV and Persistent Infections.  Vitals Occupational hygienist, BSN; 03/03/2015 9:08 AM) 03/03/2015 9:08 AM Weight: 238.4 lb  Height: 65in Body Surface Area: 2.13 m Body Mass Index: 39.67 kg/m  Temp.: 98.60F(Oral)  Pulse: 64 (Regular)  BP: 84/64 (Sitting, Left Arm, Standard)      Physical Exam [name] Ruff MD; Q000111Q 9:47 AM)  General Mental Status-Alert. General Appearance-Not in acute distress. Build & Nutrition-Well nourished. Posture-Normal posture. Gait-Normal.  Head and Neck Head-normocephalic, atraumatic with no lesions or palpable masses. Trachea-midline.  Chest and Lung Exam Chest and lung exam reveals -on auscultation, normal breath sounds, no adventitious sounds and normal vocal resonance.  Cardiovascular Cardiovascular examination reveals -normal heart sounds, regular rate and rhythm with no murmurs.  Abdomen Inspection Inspection of the abdomen reveals - No Hernias. Palpation/Percussion Palpation and Percussion of the abdomen reveal - Soft, Non Tender, No Rigidity (guarding), No hepatosplenomegaly and No Palpable abdominal masses.  Rectal Anorectal Exam External - Note: Excoriation of the skin noted. Internal - Note: . Once expressed from posterior anal canal. No obvious fissure noted. No sphincter hypertension.  Neurologic Neurologic evaluation reveals -alert and oriented x 3 with no impairment of recent or remote memory, normal attention span and ability to concentrate, normal sensation and normal coordination.  Musculoskeletal Normal Exam - Bilateral-Upper Extremity Strength Normal and Lower Extremity Strength Normal.    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 It is unclear to me whether this is a fistula, abscess or a infected fissure. I recommended exam under anesthesia with debridement and possible fistulotomy. The risks of bleeding, recurrence and a small risk of incontinence were discussed.

## 2015-03-04 DIAGNOSIS — D631 Anemia in chronic kidney disease: Secondary | ICD-10-CM | POA: Diagnosis not present

## 2015-03-04 DIAGNOSIS — N2581 Secondary hyperparathyroidism of renal origin: Secondary | ICD-10-CM | POA: Diagnosis not present

## 2015-03-04 DIAGNOSIS — N186 End stage renal disease: Secondary | ICD-10-CM | POA: Diagnosis not present

## 2015-03-06 DIAGNOSIS — N186 End stage renal disease: Secondary | ICD-10-CM | POA: Diagnosis not present

## 2015-03-06 DIAGNOSIS — N2581 Secondary hyperparathyroidism of renal origin: Secondary | ICD-10-CM | POA: Diagnosis not present

## 2015-03-06 DIAGNOSIS — D631 Anemia in chronic kidney disease: Secondary | ICD-10-CM | POA: Diagnosis not present

## 2015-03-08 DIAGNOSIS — N2581 Secondary hyperparathyroidism of renal origin: Secondary | ICD-10-CM | POA: Diagnosis not present

## 2015-03-08 DIAGNOSIS — D631 Anemia in chronic kidney disease: Secondary | ICD-10-CM | POA: Diagnosis not present

## 2015-03-08 DIAGNOSIS — N186 End stage renal disease: Secondary | ICD-10-CM | POA: Diagnosis not present

## 2015-03-11 DIAGNOSIS — N2581 Secondary hyperparathyroidism of renal origin: Secondary | ICD-10-CM | POA: Diagnosis not present

## 2015-03-11 DIAGNOSIS — D631 Anemia in chronic kidney disease: Secondary | ICD-10-CM | POA: Diagnosis not present

## 2015-03-11 DIAGNOSIS — N186 End stage renal disease: Secondary | ICD-10-CM | POA: Diagnosis not present

## 2015-03-13 DIAGNOSIS — N186 End stage renal disease: Secondary | ICD-10-CM | POA: Diagnosis not present

## 2015-03-13 DIAGNOSIS — N2581 Secondary hyperparathyroidism of renal origin: Secondary | ICD-10-CM | POA: Diagnosis not present

## 2015-03-13 DIAGNOSIS — D631 Anemia in chronic kidney disease: Secondary | ICD-10-CM | POA: Diagnosis not present

## 2015-03-15 DIAGNOSIS — N2581 Secondary hyperparathyroidism of renal origin: Secondary | ICD-10-CM | POA: Diagnosis not present

## 2015-03-15 DIAGNOSIS — D631 Anemia in chronic kidney disease: Secondary | ICD-10-CM | POA: Diagnosis not present

## 2015-03-15 DIAGNOSIS — N186 End stage renal disease: Secondary | ICD-10-CM | POA: Diagnosis not present

## 2015-03-18 DIAGNOSIS — N186 End stage renal disease: Secondary | ICD-10-CM | POA: Diagnosis not present

## 2015-03-18 DIAGNOSIS — N2581 Secondary hyperparathyroidism of renal origin: Secondary | ICD-10-CM | POA: Diagnosis not present

## 2015-03-18 DIAGNOSIS — D631 Anemia in chronic kidney disease: Secondary | ICD-10-CM | POA: Diagnosis not present

## 2015-03-19 ENCOUNTER — Encounter (HOSPITAL_BASED_OUTPATIENT_CLINIC_OR_DEPARTMENT_OTHER): Payer: Self-pay | Admitting: *Deleted

## 2015-03-20 DIAGNOSIS — D631 Anemia in chronic kidney disease: Secondary | ICD-10-CM | POA: Diagnosis not present

## 2015-03-20 DIAGNOSIS — N2581 Secondary hyperparathyroidism of renal origin: Secondary | ICD-10-CM | POA: Diagnosis not present

## 2015-03-20 DIAGNOSIS — N186 End stage renal disease: Secondary | ICD-10-CM | POA: Diagnosis not present

## 2015-03-21 ENCOUNTER — Encounter (HOSPITAL_BASED_OUTPATIENT_CLINIC_OR_DEPARTMENT_OTHER): Payer: Self-pay | Admitting: *Deleted

## 2015-03-21 NOTE — Progress Notes (Addendum)
To [facility] at 0900-Istat on arrival,Ekg,echo,cardiology office visit note with chart.instructed Npo after Mn-coreg with small amt water only,hold am insulin-states understands.  REVIEWED CHART W/ DR DENENNY MDA, OK TO PROCEED.

## 2015-03-22 DIAGNOSIS — D631 Anemia in chronic kidney disease: Secondary | ICD-10-CM | POA: Diagnosis not present

## 2015-03-22 DIAGNOSIS — N186 End stage renal disease: Secondary | ICD-10-CM | POA: Diagnosis not present

## 2015-03-22 DIAGNOSIS — N2581 Secondary hyperparathyroidism of renal origin: Secondary | ICD-10-CM | POA: Diagnosis not present

## 2015-03-23 DIAGNOSIS — Z992 Dependence on renal dialysis: Secondary | ICD-10-CM | POA: Diagnosis not present

## 2015-03-23 DIAGNOSIS — N186 End stage renal disease: Secondary | ICD-10-CM | POA: Diagnosis not present

## 2015-03-25 DIAGNOSIS — D631 Anemia in chronic kidney disease: Secondary | ICD-10-CM | POA: Diagnosis not present

## 2015-03-25 DIAGNOSIS — N186 End stage renal disease: Secondary | ICD-10-CM | POA: Diagnosis not present

## 2015-03-25 DIAGNOSIS — N2581 Secondary hyperparathyroidism of renal origin: Secondary | ICD-10-CM | POA: Diagnosis not present

## 2015-03-26 ENCOUNTER — Ambulatory Visit (HOSPITAL_BASED_OUTPATIENT_CLINIC_OR_DEPARTMENT_OTHER): Payer: Medicare Other | Admitting: Anesthesiology

## 2015-03-26 ENCOUNTER — Ambulatory Visit (HOSPITAL_BASED_OUTPATIENT_CLINIC_OR_DEPARTMENT_OTHER)
Admission: RE | Admit: 2015-03-26 | Discharge: 2015-03-26 | Disposition: A | Discharge status: Home / Self Care | Payer: Medicare Other | Source: Ambulatory Visit | Attending: General Surgery | Admitting: General Surgery

## 2015-03-26 ENCOUNTER — Encounter (HOSPITAL_BASED_OUTPATIENT_CLINIC_OR_DEPARTMENT_OTHER)
Admission: RE | Disposition: A | Discharge status: Home / Self Care | Payer: Self-pay | Source: Ambulatory Visit | Attending: General Surgery

## 2015-03-26 ENCOUNTER — Encounter (HOSPITAL_BASED_OUTPATIENT_CLINIC_OR_DEPARTMENT_OTHER): Payer: Self-pay

## 2015-03-26 DIAGNOSIS — K219 Gastro-esophageal reflux disease without esophagitis: Secondary | ICD-10-CM | POA: Diagnosis not present

## 2015-03-26 DIAGNOSIS — Z992 Dependence on renal dialysis: Secondary | ICD-10-CM | POA: Diagnosis not present

## 2015-03-26 DIAGNOSIS — Z7982 Long term (current) use of aspirin: Secondary | ICD-10-CM | POA: Insufficient documentation

## 2015-03-26 DIAGNOSIS — Z6839 Body mass index (BMI) 39.0-39.9, adult: Secondary | ICD-10-CM | POA: Insufficient documentation

## 2015-03-26 DIAGNOSIS — Z79899 Other long term (current) drug therapy: Secondary | ICD-10-CM | POA: Diagnosis not present

## 2015-03-26 DIAGNOSIS — I120 Hypertensive chronic kidney disease with stage 5 chronic kidney disease or end stage renal disease: Secondary | ICD-10-CM | POA: Insufficient documentation

## 2015-03-26 DIAGNOSIS — N186 End stage renal disease: Secondary | ICD-10-CM | POA: Insufficient documentation

## 2015-03-26 DIAGNOSIS — Z794 Long term (current) use of insulin: Secondary | ICD-10-CM | POA: Diagnosis not present

## 2015-03-26 DIAGNOSIS — E1122 Type 2 diabetes mellitus with diabetic chronic kidney disease: Secondary | ICD-10-CM | POA: Insufficient documentation

## 2015-03-26 DIAGNOSIS — Z7984 Long term (current) use of oral hypoglycemic drugs: Secondary | ICD-10-CM | POA: Insufficient documentation

## 2015-03-26 DIAGNOSIS — K603 Anal fistula: Secondary | ICD-10-CM | POA: Insufficient documentation

## 2015-03-26 DIAGNOSIS — K6289 Other specified diseases of anus and rectum: Secondary | ICD-10-CM | POA: Diagnosis not present

## 2015-03-26 HISTORY — DX: Secondary hyperparathyroidism of renal origin: N25.81

## 2015-03-26 HISTORY — DX: Personal history of other diseases of the respiratory system: Z87.09

## 2015-03-26 HISTORY — DX: Atrophy of kidney (terminal): N26.1

## 2015-03-26 HISTORY — PX: INCISION AND DRAINAGE ABSCESS: SHX5864

## 2015-03-26 HISTORY — DX: Other specified abnormal immunological findings in serum: R76.8

## 2015-03-26 HISTORY — DX: Chronic combined systolic (congestive) and diastolic (congestive) heart failure: I50.42

## 2015-03-26 HISTORY — DX: Type 2 diabetes mellitus with diabetic chronic kidney disease: E11.22

## 2015-03-26 HISTORY — DX: Left anterior fascicular block: I44.4

## 2015-03-26 HISTORY — PX: FISTULOTOMY: SHX6413

## 2015-03-26 HISTORY — DX: Dependence on renal dialysis: Z99.2

## 2015-03-26 HISTORY — DX: Anemia in chronic kidney disease: N18.9

## 2015-03-26 HISTORY — DX: Type 2 diabetes mellitus with diabetic chronic kidney disease: N18.6

## 2015-03-26 HISTORY — DX: Polyneuropathy, unspecified: G62.9

## 2015-03-26 HISTORY — DX: Ischemic cardiomyopathy: I25.5

## 2015-03-26 HISTORY — DX: Other specified diseases of anus and rectum: K62.89

## 2015-03-26 HISTORY — DX: Unspecified hemorrhoids: K64.9

## 2015-03-26 HISTORY — DX: Chronic kidney disease, unspecified: D63.1

## 2015-03-26 LAB — GLUCOSE, CAPILLARY: Glucose-Capillary: 132 mg/dL — ABNORMAL HIGH (ref 65–99)

## 2015-03-26 LAB — POCT I-STAT 4, (NA,K, GLUC, HGB,HCT)
Glucose, Bld: 164 mg/dL — ABNORMAL HIGH (ref 65–99)
HCT: 51 % (ref 39.0–52.0)
Hemoglobin: 17.3 g/dL — ABNORMAL HIGH (ref 13.0–17.0)
Potassium: 4.5 mmol/L (ref 3.5–5.1)
Sodium: 140 mmol/L (ref 135–145)

## 2015-03-26 SURGERY — INCISION AND DRAINAGE, ABSCESS
Anesthesia: Monitor Anesthesia Care

## 2015-03-26 MED ORDER — BUPIVACAINE-EPINEPHRINE (PF) 0.5% -1:200000 IJ SOLN
INTRAMUSCULAR | Status: AC
  Filled 2015-03-26: qty 30

## 2015-03-26 MED ORDER — BUPIVACAINE LIPOSOME 1.3 % IJ SUSP
INTRAMUSCULAR | Status: AC
  Filled 2015-03-26: qty 20

## 2015-03-26 MED ORDER — OXYCODONE HCL 5 MG PO TABS: 5.0000 mg | ORAL_TABLET | ORAL | Status: DC | PRN

## 2015-03-26 MED ORDER — PROPOFOL 500 MG/50ML IV EMUL
INTRAVENOUS | Status: DC | PRN
  Administered 2015-03-26: 50 ug/kg/min via INTRAVENOUS

## 2015-03-26 MED ORDER — SODIUM CHLORIDE 0.9 % IJ SOLN
3.0000 mL | INTRAMUSCULAR | Status: DC | PRN
  Filled 2015-03-26: qty 3

## 2015-03-26 MED ORDER — ACETAMINOPHEN 650 MG RE SUPP
650.0000 mg | RECTAL | Status: DC | PRN
  Filled 2015-03-26: qty 1

## 2015-03-26 MED ORDER — ACETAMINOPHEN 325 MG PO TABS
650.0000 mg | ORAL_TABLET | ORAL | Status: DC | PRN
  Filled 2015-03-26: qty 2

## 2015-03-26 MED ORDER — PHENYLEPHRINE HCL 10 MG/ML IJ SOLN
INTRAMUSCULAR | Status: DC | PRN
  Administered 2015-03-26: 40 ug via INTRAVENOUS

## 2015-03-26 MED ORDER — MIDAZOLAM HCL 2 MG/2ML IJ SOLN
INTRAMUSCULAR | Status: AC
  Filled 2015-03-26: qty 4

## 2015-03-26 MED ORDER — PROPOFOL 500 MG/50ML IV EMUL
INTRAVENOUS | Status: AC
  Filled 2015-03-26: qty 50

## 2015-03-26 MED ORDER — LIDOCAINE 5 % EX OINT
TOPICAL_OINTMENT | CUTANEOUS | Status: DC | PRN
  Administered 2015-03-26: 1

## 2015-03-26 MED ORDER — LIDOCAINE 5 % EX OINT
TOPICAL_OINTMENT | CUTANEOUS | Status: AC
  Filled 2015-03-26: qty 35.44

## 2015-03-26 MED ORDER — PHENYLEPHRINE 40 MCG/ML (10ML) SYRINGE FOR IV PUSH (FOR BLOOD PRESSURE SUPPORT)
PREFILLED_SYRINGE | INTRAVENOUS | Status: AC
  Filled 2015-03-26: qty 10

## 2015-03-26 MED ORDER — BUPIVACAINE-EPINEPHRINE 0.5% -1:200000 IJ SOLN
INTRAMUSCULAR | Status: DC | PRN
  Administered 2015-03-26: 30 mL

## 2015-03-26 MED ORDER — ACETIC ACID 5 % SOLN
Status: AC
  Filled 2015-03-26: qty 500

## 2015-03-26 MED ORDER — SODIUM CHLORIDE 0.9 % IJ SOLN
3.0000 mL | Freq: Two times a day (BID) | INTRAMUSCULAR | Status: DC
  Filled 2015-03-26: qty 3

## 2015-03-26 MED ORDER — OXYCODONE HCL 5 MG PO TABS
5.0000 mg | ORAL_TABLET | ORAL | Status: DC | PRN
  Filled 2015-03-26: qty 2

## 2015-03-26 MED ORDER — LIDOCAINE HCL (CARDIAC) 20 MG/ML IV SOLN
INTRAVENOUS | Status: DC | PRN
  Administered 2015-03-26: 50 mg via INTRAVENOUS

## 2015-03-26 MED ORDER — FENTANYL CITRATE (PF) 100 MCG/2ML IJ SOLN
INTRAMUSCULAR | Status: AC
  Filled 2015-03-26: qty 2

## 2015-03-26 MED ORDER — 0.9 % SODIUM CHLORIDE (POUR BTL) OPTIME
TOPICAL | Status: DC | PRN
  Administered 2015-03-26: 500 mL

## 2015-03-26 MED ORDER — OXYCODONE HCL 5 MG/5ML PO SOLN
5.0000 mg | Freq: Once | ORAL | Status: DC | PRN
  Filled 2015-03-26: qty 5

## 2015-03-26 MED ORDER — ONDANSETRON HCL 4 MG/2ML IJ SOLN
INTRAMUSCULAR | Status: DC | PRN
  Administered 2015-03-26: 4 mg via INTRAVENOUS

## 2015-03-26 MED ORDER — ONDANSETRON HCL 4 MG/2ML IJ SOLN
INTRAMUSCULAR | Status: AC
  Filled 2015-03-26: qty 2

## 2015-03-26 MED ORDER — SODIUM CHLORIDE 0.9 % IV SOLN
INTRAVENOUS | Status: DC
  Administered 2015-03-26: 10:00:00 via INTRAVENOUS
  Filled 2015-03-26: qty 1000

## 2015-03-26 MED ORDER — MIDAZOLAM HCL 5 MG/5ML IJ SOLN
INTRAMUSCULAR | Status: DC | PRN
  Administered 2015-03-26: 2 mg via INTRAVENOUS
  Administered 2015-03-26: 1 mg via INTRAVENOUS

## 2015-03-26 MED ORDER — FENTANYL CITRATE (PF) 100 MCG/2ML IJ SOLN
INTRAMUSCULAR | Status: DC | PRN
  Administered 2015-03-26: 25 ug via INTRAVENOUS

## 2015-03-26 MED ORDER — MEPERIDINE HCL 25 MG/ML IJ SOLN
6.2500 mg | INTRAMUSCULAR | Status: DC | PRN
  Filled 2015-03-26: qty 1

## 2015-03-26 MED ORDER — OXYCODONE HCL 5 MG PO TABS
5.0000 mg | ORAL_TABLET | Freq: Once | ORAL | Status: DC | PRN
  Filled 2015-03-26: qty 1

## 2015-03-26 MED ORDER — SODIUM CHLORIDE 0.9 % IV SOLN
250.0000 mL | INTRAVENOUS | Status: DC | PRN
  Filled 2015-03-26: qty 250

## 2015-03-26 MED ORDER — HYDROMORPHONE HCL 1 MG/ML IJ SOLN
0.2500 mg | INTRAMUSCULAR | Status: DC | PRN
  Filled 2015-03-26: qty 1

## 2015-03-26 MED ORDER — LIDOCAINE HCL (CARDIAC) 20 MG/ML IV SOLN
INTRAVENOUS | Status: AC
  Filled 2015-03-26: qty 5

## 2015-03-26 SURGICAL SUPPLY — 49 items
BLADE HEX COATED 2.75 (ELECTRODE) ×1 IMPLANT
BLADE SURG 15 STRL LF DISP TIS (BLADE) ×1 IMPLANT
BLADE SURG 15 STRL SS (BLADE) ×2
BRIEF STRETCH FOR OB PAD LRG (UNDERPADS AND DIAPERS) ×3 IMPLANT
CANISTER SUCTION 2500CC (MISCELLANEOUS) ×2 IMPLANT
COVER BACK TABLE 60X90IN (DRAPES) ×2 IMPLANT
COVER MAYO STAND STRL (DRAPES) ×2 IMPLANT
DRAPE LG THREE QUARTER DISP (DRAPES) IMPLANT
DRAPE PED LAPAROTOMY (DRAPES) IMPLANT
DRAPE UNDERBUTTOCKS STRL (DRAPE) IMPLANT
DRAPE UTILITY XL STRL (DRAPES) ×2 IMPLANT
DRSG PAD ABDOMINAL 8X10 ST (GAUZE/BANDAGES/DRESSINGS) ×1 IMPLANT
ELECT REM PT RETURN 9FT ADLT (ELECTROSURGICAL) ×2
ELECTRODE REM PT RTRN 9FT ADLT (ELECTROSURGICAL) ×1 IMPLANT
GAUZE SPONGE 4X4 12PLY STRL (GAUZE/BANDAGES/DRESSINGS) ×1 IMPLANT
GAUZE SPONGE 4X4 16PLY XRAY LF (GAUZE/BANDAGES/DRESSINGS) IMPLANT
GAUZE VASELINE 3X9 (GAUZE/BANDAGES/DRESSINGS) IMPLANT
GLOVE BIO SURGEON STRL SZ 6.5 (GLOVE) ×4 IMPLANT
GOWN STRL REUS W/ TWL LRG LVL3 (GOWN DISPOSABLE) ×1 IMPLANT
GOWN STRL REUS W/ TWL XL LVL3 (GOWN DISPOSABLE) ×2 IMPLANT
GOWN STRL REUS W/TWL LRG LVL3 (GOWN DISPOSABLE) ×2
GOWN STRL REUS W/TWL XL LVL3 (GOWN DISPOSABLE) ×4
HYDROGEN PEROXIDE 16OZ (MISCELLANEOUS) IMPLANT
KIT ROOM TURNOVER WOR (KITS) ×2 IMPLANT
LEGGING LITHOTOMY PAIR STRL (DRAPES) IMPLANT
LOOP VESSEL MAXI BLUE (MISCELLANEOUS) IMPLANT
NDL HYPO 25X1 1.5 SAFETY (NEEDLE) ×1 IMPLANT
NDL SAFETY ECLIPSE 18X1.5 (NEEDLE) IMPLANT
NEEDLE HYPO 18GX1.5 SHARP (NEEDLE)
NEEDLE HYPO 25X1 1.5 SAFETY (NEEDLE) ×2 IMPLANT
NS IRRIG 500ML POUR BTL (IV SOLUTION) ×2 IMPLANT
PACK BASIN DAY SURGERY FS (CUSTOM PROCEDURE TRAY) ×2 IMPLANT
PAD ABD 8X10 STRL (GAUZE/BANDAGES/DRESSINGS) IMPLANT
PAD ARMBOARD 7.5X6 YLW CONV (MISCELLANEOUS) ×1 IMPLANT
PENCIL BUTTON HOLSTER BLD 10FT (ELECTRODE) ×2 IMPLANT
SPONGE GAUZE 4X4 12PLY (GAUZE/BANDAGES/DRESSINGS) IMPLANT
SPONGE SURGIFOAM ABS GEL 12-7 (HEMOSTASIS) IMPLANT
SUT CHROMIC 2 0 SH (SUTURE) IMPLANT
SUT CHROMIC 3 0 SH 27 (SUTURE) ×1 IMPLANT
SUT ETHIBOND 0 (SUTURE) IMPLANT
SUT MON AB 3-0 SH 27 (SUTURE)
SUT MON AB 3-0 SH27 (SUTURE) IMPLANT
SUT VIC AB 4-0 P-3 18XBRD (SUTURE) IMPLANT
SUT VIC AB 4-0 P3 18 (SUTURE)
SYR CONTROL 10ML LL (SYRINGE) ×2 IMPLANT
TOWEL OR 17X24 6PK STRL BLUE (TOWEL DISPOSABLE) ×2 IMPLANT
TRAY DSU PREP LF (CUSTOM PROCEDURE TRAY) ×2 IMPLANT
TUBE CONNECTING 12X1/4 (SUCTIONS) ×2 IMPLANT
YANKAUER SUCT BULB TIP NO VENT (SUCTIONS) ×2 IMPLANT

## 2015-03-26 NOTE — H&P (View-Only) (Signed)
[name] [name] 03/03/2015 9:07 AM Location: [facility] Patient #: [id number] DOB: [birth year] Single / Language: [name] / Race: Undefined Male  History of Present Illness [name] Ruff MD; Q000111Q 9:46 AM) Patient words: Hemorrhoids.  The patient is a 50 year old male who presents with anal pain. 50yo M with 3 month h/o anal pain with BM's. The pain is sharp in nature during BM's. He has a h/o constipation and hard stools but uses Dulcolax on a weekly basis for this. He gets quite a bit of fiber in his diet. He is a dialysis pt and thinks the medications he takes for this causes him to have hard stools.   Other Problems [name], RN, BSN; 03/03/2015 9:07 AM) Chronic Renal Failure Syndrome Diabetes Mellitus Gastroesophageal Reflux Disease Other disease, cancer, significant illness  Past Surgical History [name], RN, BSN; 03/03/2015 9:07 AM) Appendectomy Dialysis Shunt / Fistula  Diagnostic Studies History [name], RN, BSN; 03/03/2015 9:07 AM) Colonoscopy never  Allergies [name], RN, BSN; 03/03/2015 9:09 AM) No Known Drug Allergies 03/03/2015  Medication History [name], RN, BSN; 03/03/2015 9:12 AM) [name] (1 [id number] MG(Fe) Tablet, Oral daily) Active. Calcium Acetate (Phos Binder) (667MG  Capsule, Oral daily) Active. Carvedilol (6.25MG  Tablet, Oral daily) Active. Gabapentin (300MG  Capsule, Oral daily) Active. Lipitor (10MG  Tablet, Oral daily) Active. Renvela (800MG  Tablet, Oral daily) Active. Sensipar (60MG  Tablet, Oral daily) Active. Uloric (40MG  Tablet, Oral daily) Active. Istalol (0.5% Solution, Ophthalmic daily) Active. Aspirin (81MG  Tablet Chewable, Oral daily) Active. NovoLOG (100UNIT/ML Solution, Subcutaneous daily) Active. B-12 (100MCG Tablet, Oral daily) Active. Medications Reconciled  Social History ([name], RN, BSN; 03/03/2015 9:07 AM) Alcohol use Remotely quit  alcohol use. Caffeine use Carbonated beverages, Coffee. No drug use Tobacco use Never smoker.  Family History ([name], RN, BSN; 03/03/2015 9:07 AM) Diabetes Mellitus Brother, Father. Heart Disease Brother. Heart disease in male family member before age 63 Kidney Disease Father.     Review of Systems Occupational hygienist, BSN; 03/03/2015 9:07 AM) General Not Present- Appetite Loss, Chills, Fatigue, Fever, Night Sweats, Weight Gain and Weight Loss. Skin Present- Dryness. Not Present- Change in Wart/Mole, Hives, Jaundice, New Lesions, Non-Healing Wounds, Rash and Ulcer. HEENT Present- Wears glasses/contact lenses. Not Present- Earache, Hearing Loss, Hoarseness, Nose Bleed, Oral Ulcers, Ringing in the Ears, Seasonal Allergies, Sinus Pain, Sore Throat, Visual Disturbances and Yellow Eyes. Respiratory Not Present- Bloody sputum, Chronic Cough, Difficulty Breathing, Snoring and Wheezing. Breast Not Present- Breast Mass, Breast Pain, Nipple Discharge and Skin Changes. Cardiovascular Not Present- Chest Pain, Difficulty Breathing Lying Down, Leg Cramps, Palpitations, Rapid Heart Rate, Shortness of Breath and Swelling of Extremities. Gastrointestinal Present- Chronic diarrhea, Constipation, Hemorrhoids and Rectal Pain. Not Present- Abdominal Pain, Bloating, Bloody Stool, Change in Bowel Habits, Difficulty Swallowing, Excessive gas, Gets full quickly at meals, Indigestion, Nausea and Vomiting. Male Genitourinary Not Present- Blood in Urine, Change in Urinary Stream, Frequency, Impotence, Nocturia, Painful Urination, Urgency and Urine Leakage. Musculoskeletal Present- Joint Pain. Not Present- Back Pain, Joint Stiffness, Muscle Pain, Muscle Weakness and Swelling of Extremities. Neurological Present- Numbness and Tingling. Not Present- Decreased Memory, Fainting, Headaches, Seizures, Tremor, Trouble walking and Weakness. Psychiatric Not Present- Anxiety, Bipolar, Change in Sleep Pattern,  Depression, Fearful and Frequent crying. Endocrine Not Present- Cold Intolerance, Excessive Hunger, Hair Changes, Heat Intolerance, Hot flashes and New Diabetes. Hematology Not Present- Easy Bruising, Excessive bleeding, Gland problems, HIV and Persistent Infections.  Vitals Occupational hygienist, BSN; 03/03/2015 9:08 AM) 03/03/2015 9:08 AM Weight: 238.4 lb  Height: 65in Body Surface Area: 2.13 m Body Mass Index: 39.67 kg/m  Temp.: 98.66F(Oral)  Pulse: 64 (Regular)  BP: 84/64 (Sitting, Left Arm, Standard)      Physical Exam [name] Ruff MD; Q000111Q 9:47 AM)  General Mental Status-Alert. General Appearance-Not in acute distress. Build & Nutrition-Well nourished. Posture-Normal posture. Gait-Normal.  Head and Neck Head-normocephalic, atraumatic with no lesions or palpable masses. Trachea-midline.  Chest and Lung Exam Chest and lung exam reveals -on auscultation, normal breath sounds, no adventitious sounds and normal vocal resonance.  Cardiovascular Cardiovascular examination reveals -normal heart sounds, regular rate and rhythm with no murmurs.  Abdomen Inspection Inspection of the abdomen reveals - No Hernias. Palpation/Percussion Palpation and Percussion of the abdomen reveal - Soft, Non Tender, No Rigidity (guarding), No hepatosplenomegaly and No Palpable abdominal masses.  Rectal Anorectal Exam External - Note: Excoriation of the skin noted. Internal - Note: . Once expressed from posterior anal canal. No obvious fissure noted. No sphincter hypertension.  Neurologic Neurologic evaluation reveals -alert and oriented x 3 with no impairment of recent or remote memory, normal attention span and ability to concentrate, normal sensation and normal coordination.  Musculoskeletal Normal Exam - Bilateral-Upper Extremity Strength Normal and Lower Extremity Strength Normal.    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 It is unclear to me whether this is a fistula, abscess or a infected fissure. I recommended exam under anesthesia with debridement and possible fistulotomy. The risks of bleeding, recurrence and a small risk of incontinence were discussed.

## 2015-03-26 NOTE — Interval H&P Note (Signed)
History and Physical Interval Note:  03/26/2015 9:35 AM  [name] [name]  has presented today for surgery, with the diagnosis of posterior anal canal infection  The various methods of treatment have been discussed with the patient and family. After consideration of risks, benefits and other options for treatment, the patient has consented to  Procedure(s): ANAL INCISION AND DRAINAGE (N/A) POSSIBLE FISTULOTOMY (N/A) as a surgical intervention .  The patient's history has been reviewed, patient examined, no change in status, stable for surgery.  I have reviewed the patient's chart and labs.  Questions were answered to the patient's satisfaction.  Risks include bleeding ,infection and a small risk of incontinence if fistulotomy is performed.    Rosario Adie, MD  [facility]

## 2015-03-26 NOTE — Discharge Instructions (Addendum)

## 2015-03-26 NOTE — Anesthesia Procedure Notes (Signed)
Procedure Name: MAC Date/Time: 03/26/2015 10:00 AM Performed by: Mechele Claude Pre-anesthesia Checklist: Patient identified, Timeout performed, Emergency Drugs available, Suction available and Patient being monitored Patient Re-evaluated:Patient Re-evaluated prior to inductionOxygen Delivery Method: Simple face mask Preoxygenation: Pre-oxygenation with 100% oxygen Intubation Type: IV induction Placement Confirmation: positive ETCO2,  CO2 detector and breath sounds checked- equal and bilateral

## 2015-03-26 NOTE — Anesthesia Preprocedure Evaluation (Addendum)
Anesthesia Evaluation  Patient identified by MRN, date of birth, ID band Patient awake    Reviewed: Allergy & Precautions, NPO status , Patient's Chart, lab work & pertinent test results  Airway Mallampati: I  TM Distance: >3 FB Neck ROM: Full    Dental  (+) Teeth Intact, Dental Advisory Given   Pulmonary    breath sounds clear to auscultation       Cardiovascular hypertension, Pt. on medications  Rhythm:Regular Rate:Normal     Neuro/Psych    GI/Hepatic   Endo/Other  diabetes, Well Controlled, Type 2, Oral Hypoglycemic AgentsMorbid obesity  Renal/GU ESRF and DialysisRenal disease     Musculoskeletal   Abdominal   Peds  Hematology   Anesthesia Other Findings   Reproductive/Obstetrics                            Anesthesia Physical Anesthesia Plan  ASA: III  Anesthesia Plan: MAC   Post-op Pain Management:    Induction: Intravenous  Airway Management Planned: Simple Face Mask and Nasal Cannula  Additional Equipment:   Intra-op Plan:   Post-operative Plan:   Informed Consent: I have reviewed the patients History and Physical, chart, labs and discussed the procedure including the risks, benefits and alternatives for the proposed anesthesia with the patient or authorized representative who has indicated his/her understanding and acceptance.   Dental advisory given  Plan Discussed with: CRNA, Anesthesiologist and Surgeon  Anesthesia Plan Comments:         Anesthesia Quick Evaluation

## 2015-03-26 NOTE — Anesthesia Postprocedure Evaluation (Signed)
Anesthesia Post Note  Patient: [name] [name]  Procedure(s) Performed: Procedure(s) (LRB): ANAL INCISION AND DRAINAGE (N/A) FISTULOTOMY (N/A)  Patient location during evaluation: PACU Anesthesia Type: General Level of consciousness: awake and alert Pain management: pain level controlled Vital Signs Assessment: post-procedure vital signs reviewed and stable Respiratory status: spontaneous breathing, nonlabored ventilation, respiratory function stable and patient connected to nasal cannula oxygen Cardiovascular status: blood pressure returned to baseline and stable Postop Assessment: no signs of nausea or vomiting Anesthetic complications: no    Last Vitals:  Filed Vitals:   03/26/15 0921 03/26/15 1030  BP: 102/60 84/60  Pulse: 108 101  Temp: 37 C 36.6 C  Resp: 18 16    Last Pain:  Filed Vitals:   03/26/15 1040  PainSc: 3                  Sahiba Granholm A

## 2015-03-26 NOTE — Transfer of Care (Signed)
Last Vitals:  Filed Vitals:   03/26/15 0921  BP: 102/60  Pulse: 108  Temp: 37 C  Resp: 18   Immediate Anesthesia Transfer of Care Note  Patient: [name] [name]  Procedure(s) Performed: Procedure(s) (LRB): ANAL INCISION AND DRAINAGE (N/A) FISTULOTOMY (N/A)  Patient Location: PACU  Anesthesia Type:MAC  Level of Consciousness: awake, alert  and oriented  Airway & Oxygen Therapy: Patient Spontanous Breathing and Patient connected to face mask oxygen  Post-op Assessment: Report given to PACU RN and Post -op Vital signs reviewed and stable  Post vital signs: Reviewed and stable  Complications: No apparent anesthesia complications

## 2015-03-26 NOTE — Op Note (Signed)
03/26/2015  10:25 AM  PATIENT:  [name] [name]  50 y.o. male  Patient Care Team: Provider Not In System as PCP - General  PRE-OPERATIVE DIAGNOSIS:  posterior anal canal infection  POST-OPERATIVE DIAGNOSIS:  L posterior anal fistula  PROCEDURE:   ANAL EXAM UNDER ANESTHESIA FISTULOTOMY  SURGEON:  Surgeon(s): [name], MD  ASSISTANT: none   ANESTHESIA:   local and MAC  SPECIMEN:  No Specimen  DISPOSITION OF SPECIMEN:  N/A  COUNTS:  YES  PLAN OF CARE: Discharge to home after PACU  PATIENT DISPOSITION:  PACU - hemodynamically stable.  INDICATION: 50 y.o. M with anal discomfort and drainage    OR FINDINGS: L posterior anal fistula involving a small amount of internal sphincter  DESCRIPTION: the patient was identified in the preoperative holding area and taken to the OR where they were laid on the operating room table.  MAC anesthesia was induced without difficulty. The patient was then positioned in prone jackknife position with buttocks gently taped apart.  The patient was then prepped and draped in usual sterile fashion.  SCDs were noted to be in place prior to the initiation of anesthesia. A surgical timeout was performed indicating the correct patient, procedure, positioning and need for preoperative antibiotics.  A rectal block was performed using Marcaine with epinephrine.    I began with a digital rectal exam.  There were no masses.  There was an area of chronic inflammation posteriorly that express purulence.  I then placed a Hill-Ferguson anoscope into the anal canal and evaluated this completely.  There were no other findings in the internal anal canal except for an internal opening in the left posterior dentate line. I used an S shaped fistula probe and was able to traverse the fistula which was in a radial pattern. A fistulotomy was performed with electrocautery. Only a small amount of internal sphincter was incised. The edges were marsupialized using 3-0 chromic  suture. All counts were correct per operating room staff. A dressing was applied. The patient was awakened from anesthesia and sent to the postanesthesia care unit in stable condition.

## 2015-03-27 ENCOUNTER — Encounter (HOSPITAL_BASED_OUTPATIENT_CLINIC_OR_DEPARTMENT_OTHER): Payer: Self-pay | Admitting: General Surgery

## 2015-03-27 DIAGNOSIS — N2581 Secondary hyperparathyroidism of renal origin: Secondary | ICD-10-CM | POA: Diagnosis not present

## 2015-03-27 DIAGNOSIS — N186 End stage renal disease: Secondary | ICD-10-CM | POA: Diagnosis not present

## 2015-03-27 DIAGNOSIS — D631 Anemia in chronic kidney disease: Secondary | ICD-10-CM | POA: Diagnosis not present

## 2015-03-27 DIAGNOSIS — M109 Gout, unspecified: Secondary | ICD-10-CM | POA: Diagnosis not present

## 2015-03-27 DIAGNOSIS — E119 Type 2 diabetes mellitus without complications: Secondary | ICD-10-CM | POA: Diagnosis not present

## 2015-03-31 LAB — POCT I-STAT 4, (NA,K, GLUC, HGB,HCT)
Glucose, Bld: 171 mg/dL — ABNORMAL HIGH (ref 65–99)
HCT: 51 % (ref 39.0–52.0)
Hemoglobin: 17.3 g/dL — ABNORMAL HIGH (ref 13.0–17.0)
Potassium: 6.2 mmol/L (ref 3.5–5.1)
Sodium: 137 mmol/L (ref 135–145)

## 2015-04-01 DIAGNOSIS — N186 End stage renal disease: Secondary | ICD-10-CM | POA: Diagnosis not present

## 2015-04-01 DIAGNOSIS — D631 Anemia in chronic kidney disease: Secondary | ICD-10-CM | POA: Diagnosis not present

## 2015-04-01 DIAGNOSIS — N2581 Secondary hyperparathyroidism of renal origin: Secondary | ICD-10-CM | POA: Diagnosis not present

## 2015-04-03 DIAGNOSIS — D631 Anemia in chronic kidney disease: Secondary | ICD-10-CM | POA: Diagnosis not present

## 2015-04-03 DIAGNOSIS — N2581 Secondary hyperparathyroidism of renal origin: Secondary | ICD-10-CM | POA: Diagnosis not present

## 2015-04-03 DIAGNOSIS — N186 End stage renal disease: Secondary | ICD-10-CM | POA: Diagnosis not present

## 2015-04-05 DIAGNOSIS — D631 Anemia in chronic kidney disease: Secondary | ICD-10-CM | POA: Diagnosis not present

## 2015-04-05 DIAGNOSIS — N186 End stage renal disease: Secondary | ICD-10-CM | POA: Diagnosis not present

## 2015-04-05 DIAGNOSIS — N2581 Secondary hyperparathyroidism of renal origin: Secondary | ICD-10-CM | POA: Diagnosis not present

## 2015-04-08 DIAGNOSIS — N186 End stage renal disease: Secondary | ICD-10-CM | POA: Diagnosis not present

## 2015-04-08 DIAGNOSIS — D631 Anemia in chronic kidney disease: Secondary | ICD-10-CM | POA: Diagnosis not present

## 2015-04-08 DIAGNOSIS — N2581 Secondary hyperparathyroidism of renal origin: Secondary | ICD-10-CM | POA: Diagnosis not present

## 2015-04-10 DIAGNOSIS — D631 Anemia in chronic kidney disease: Secondary | ICD-10-CM | POA: Diagnosis not present

## 2015-04-10 DIAGNOSIS — N2581 Secondary hyperparathyroidism of renal origin: Secondary | ICD-10-CM | POA: Diagnosis not present

## 2015-04-10 DIAGNOSIS — N186 End stage renal disease: Secondary | ICD-10-CM | POA: Diagnosis not present

## 2015-04-12 DIAGNOSIS — N2581 Secondary hyperparathyroidism of renal origin: Secondary | ICD-10-CM | POA: Diagnosis not present

## 2015-04-12 DIAGNOSIS — N186 End stage renal disease: Secondary | ICD-10-CM | POA: Diagnosis not present

## 2015-04-12 DIAGNOSIS — D631 Anemia in chronic kidney disease: Secondary | ICD-10-CM | POA: Diagnosis not present

## 2015-04-14 DIAGNOSIS — E7800 Pure hypercholesterolemia, unspecified: Secondary | ICD-10-CM | POA: Diagnosis not present

## 2015-04-14 DIAGNOSIS — Z79899 Other long term (current) drug therapy: Secondary | ICD-10-CM | POA: Diagnosis not present

## 2015-04-14 DIAGNOSIS — N185 Chronic kidney disease, stage 5: Secondary | ICD-10-CM | POA: Diagnosis not present

## 2015-04-14 DIAGNOSIS — I5030 Unspecified diastolic (congestive) heart failure: Secondary | ICD-10-CM | POA: Diagnosis not present

## 2015-04-14 DIAGNOSIS — K603 Anal fistula: Secondary | ICD-10-CM | POA: Diagnosis not present

## 2015-04-14 DIAGNOSIS — M1990 Unspecified osteoarthritis, unspecified site: Secondary | ICD-10-CM | POA: Diagnosis not present

## 2015-04-14 DIAGNOSIS — E1165 Type 2 diabetes mellitus with hyperglycemia: Secondary | ICD-10-CM | POA: Diagnosis not present

## 2015-04-14 DIAGNOSIS — E669 Obesity, unspecified: Secondary | ICD-10-CM | POA: Diagnosis not present

## 2015-04-14 DIAGNOSIS — E1121 Type 2 diabetes mellitus with diabetic nephropathy: Secondary | ICD-10-CM | POA: Diagnosis not present

## 2015-04-15 DIAGNOSIS — N186 End stage renal disease: Secondary | ICD-10-CM | POA: Diagnosis not present

## 2015-04-15 DIAGNOSIS — D631 Anemia in chronic kidney disease: Secondary | ICD-10-CM | POA: Diagnosis not present

## 2015-04-15 DIAGNOSIS — N2581 Secondary hyperparathyroidism of renal origin: Secondary | ICD-10-CM | POA: Diagnosis not present

## 2015-04-17 DIAGNOSIS — D631 Anemia in chronic kidney disease: Secondary | ICD-10-CM | POA: Diagnosis not present

## 2015-04-17 DIAGNOSIS — N186 End stage renal disease: Secondary | ICD-10-CM | POA: Diagnosis not present

## 2015-04-17 DIAGNOSIS — N2581 Secondary hyperparathyroidism of renal origin: Secondary | ICD-10-CM | POA: Diagnosis not present

## 2015-04-18 ENCOUNTER — Ambulatory Visit (INDEPENDENT_AMBULATORY_CARE_PROVIDER_SITE_OTHER): Payer: Medicare Other | Admitting: Ophthalmology

## 2015-04-18 DIAGNOSIS — H2513 Age-related nuclear cataract, bilateral: Secondary | ICD-10-CM

## 2015-04-18 DIAGNOSIS — E103522 Type 1 diabetes mellitus with proliferative diabetic retinopathy with traction retinal detachment involving the macula, left eye: Secondary | ICD-10-CM | POA: Diagnosis not present

## 2015-04-18 DIAGNOSIS — E11311 Type 2 diabetes mellitus with unspecified diabetic retinopathy with macular edema: Secondary | ICD-10-CM

## 2015-04-18 DIAGNOSIS — I10 Essential (primary) hypertension: Secondary | ICD-10-CM | POA: Diagnosis not present

## 2015-04-18 DIAGNOSIS — E103591 Type 1 diabetes mellitus with proliferative diabetic retinopathy without macular edema, right eye: Secondary | ICD-10-CM

## 2015-04-18 DIAGNOSIS — H35033 Hypertensive retinopathy, bilateral: Secondary | ICD-10-CM | POA: Diagnosis not present

## 2015-04-18 DIAGNOSIS — H43813 Vitreous degeneration, bilateral: Secondary | ICD-10-CM | POA: Diagnosis not present

## 2015-04-19 DIAGNOSIS — N186 End stage renal disease: Secondary | ICD-10-CM | POA: Diagnosis not present

## 2015-04-19 DIAGNOSIS — D631 Anemia in chronic kidney disease: Secondary | ICD-10-CM | POA: Diagnosis not present

## 2015-04-19 DIAGNOSIS — N2581 Secondary hyperparathyroidism of renal origin: Secondary | ICD-10-CM | POA: Diagnosis not present

## 2015-04-22 DIAGNOSIS — N2581 Secondary hyperparathyroidism of renal origin: Secondary | ICD-10-CM | POA: Diagnosis not present

## 2015-04-22 DIAGNOSIS — N186 End stage renal disease: Secondary | ICD-10-CM | POA: Diagnosis not present

## 2015-04-22 DIAGNOSIS — D631 Anemia in chronic kidney disease: Secondary | ICD-10-CM | POA: Diagnosis not present

## 2015-04-23 DIAGNOSIS — Z992 Dependence on renal dialysis: Secondary | ICD-10-CM | POA: Diagnosis not present

## 2015-04-23 DIAGNOSIS — N186 End stage renal disease: Secondary | ICD-10-CM | POA: Diagnosis not present

## 2015-04-24 DIAGNOSIS — N2581 Secondary hyperparathyroidism of renal origin: Secondary | ICD-10-CM | POA: Diagnosis not present

## 2015-04-24 DIAGNOSIS — D631 Anemia in chronic kidney disease: Secondary | ICD-10-CM | POA: Diagnosis not present

## 2015-04-24 DIAGNOSIS — E1139 Type 2 diabetes mellitus with other diabetic ophthalmic complication: Secondary | ICD-10-CM | POA: Diagnosis not present

## 2015-04-24 DIAGNOSIS — N186 End stage renal disease: Secondary | ICD-10-CM | POA: Diagnosis not present

## 2015-04-26 DIAGNOSIS — N186 End stage renal disease: Secondary | ICD-10-CM | POA: Diagnosis not present

## 2015-04-26 DIAGNOSIS — D631 Anemia in chronic kidney disease: Secondary | ICD-10-CM | POA: Diagnosis not present

## 2015-04-26 DIAGNOSIS — N2581 Secondary hyperparathyroidism of renal origin: Secondary | ICD-10-CM | POA: Diagnosis not present

## 2015-04-28 ENCOUNTER — Other Ambulatory Visit (INDEPENDENT_AMBULATORY_CARE_PROVIDER_SITE_OTHER): Payer: Medicare Other | Admitting: Ophthalmology

## 2015-04-28 DIAGNOSIS — E113511 Type 2 diabetes mellitus with proliferative diabetic retinopathy with macular edema, right eye: Secondary | ICD-10-CM | POA: Diagnosis not present

## 2015-04-28 DIAGNOSIS — E11311 Type 2 diabetes mellitus with unspecified diabetic retinopathy with macular edema: Secondary | ICD-10-CM

## 2015-04-29 DIAGNOSIS — D631 Anemia in chronic kidney disease: Secondary | ICD-10-CM | POA: Diagnosis not present

## 2015-04-29 DIAGNOSIS — N186 End stage renal disease: Secondary | ICD-10-CM | POA: Diagnosis not present

## 2015-04-29 DIAGNOSIS — N2581 Secondary hyperparathyroidism of renal origin: Secondary | ICD-10-CM | POA: Diagnosis not present

## 2015-05-01 DIAGNOSIS — N186 End stage renal disease: Secondary | ICD-10-CM | POA: Diagnosis not present

## 2015-05-01 DIAGNOSIS — N2581 Secondary hyperparathyroidism of renal origin: Secondary | ICD-10-CM | POA: Diagnosis not present

## 2015-05-01 DIAGNOSIS — D631 Anemia in chronic kidney disease: Secondary | ICD-10-CM | POA: Diagnosis not present

## 2015-05-03 DIAGNOSIS — D631 Anemia in chronic kidney disease: Secondary | ICD-10-CM | POA: Diagnosis not present

## 2015-05-03 DIAGNOSIS — N186 End stage renal disease: Secondary | ICD-10-CM | POA: Diagnosis not present

## 2015-05-03 DIAGNOSIS — N2581 Secondary hyperparathyroidism of renal origin: Secondary | ICD-10-CM | POA: Diagnosis not present

## 2015-05-06 DIAGNOSIS — D631 Anemia in chronic kidney disease: Secondary | ICD-10-CM | POA: Diagnosis not present

## 2015-05-06 DIAGNOSIS — N2581 Secondary hyperparathyroidism of renal origin: Secondary | ICD-10-CM | POA: Diagnosis not present

## 2015-05-06 DIAGNOSIS — N186 End stage renal disease: Secondary | ICD-10-CM | POA: Diagnosis not present

## 2015-05-07 DIAGNOSIS — H33012 Retinal detachment with single break, left eye: Secondary | ICD-10-CM | POA: Diagnosis not present

## 2015-05-07 DIAGNOSIS — H401114 Primary open-angle glaucoma, right eye, indeterminate stage: Secondary | ICD-10-CM | POA: Diagnosis not present

## 2015-05-07 DIAGNOSIS — H16223 Keratoconjunctivitis sicca, not specified as Sjogren's, bilateral: Secondary | ICD-10-CM | POA: Diagnosis not present

## 2015-05-07 DIAGNOSIS — E113593 Type 2 diabetes mellitus with proliferative diabetic retinopathy without macular edema, bilateral: Secondary | ICD-10-CM | POA: Diagnosis not present

## 2015-05-08 DIAGNOSIS — N2581 Secondary hyperparathyroidism of renal origin: Secondary | ICD-10-CM | POA: Diagnosis not present

## 2015-05-08 DIAGNOSIS — D631 Anemia in chronic kidney disease: Secondary | ICD-10-CM | POA: Diagnosis not present

## 2015-05-08 DIAGNOSIS — N186 End stage renal disease: Secondary | ICD-10-CM | POA: Diagnosis not present

## 2015-05-10 DIAGNOSIS — N186 End stage renal disease: Secondary | ICD-10-CM | POA: Diagnosis not present

## 2015-05-10 DIAGNOSIS — N2581 Secondary hyperparathyroidism of renal origin: Secondary | ICD-10-CM | POA: Diagnosis not present

## 2015-05-10 DIAGNOSIS — D631 Anemia in chronic kidney disease: Secondary | ICD-10-CM | POA: Diagnosis not present

## 2015-05-13 DIAGNOSIS — D631 Anemia in chronic kidney disease: Secondary | ICD-10-CM | POA: Diagnosis not present

## 2015-05-13 DIAGNOSIS — N2581 Secondary hyperparathyroidism of renal origin: Secondary | ICD-10-CM | POA: Diagnosis not present

## 2015-05-13 DIAGNOSIS — N186 End stage renal disease: Secondary | ICD-10-CM | POA: Diagnosis not present

## 2015-05-15 DIAGNOSIS — D631 Anemia in chronic kidney disease: Secondary | ICD-10-CM | POA: Diagnosis not present

## 2015-05-15 DIAGNOSIS — N186 End stage renal disease: Secondary | ICD-10-CM | POA: Diagnosis not present

## 2015-05-15 DIAGNOSIS — N2581 Secondary hyperparathyroidism of renal origin: Secondary | ICD-10-CM | POA: Diagnosis not present

## 2015-05-17 DIAGNOSIS — N186 End stage renal disease: Secondary | ICD-10-CM | POA: Diagnosis not present

## 2015-05-17 DIAGNOSIS — N2581 Secondary hyperparathyroidism of renal origin: Secondary | ICD-10-CM | POA: Diagnosis not present

## 2015-05-17 DIAGNOSIS — D631 Anemia in chronic kidney disease: Secondary | ICD-10-CM | POA: Diagnosis not present

## 2015-05-20 DIAGNOSIS — N2581 Secondary hyperparathyroidism of renal origin: Secondary | ICD-10-CM | POA: Diagnosis not present

## 2015-05-20 DIAGNOSIS — D631 Anemia in chronic kidney disease: Secondary | ICD-10-CM | POA: Diagnosis not present

## 2015-05-20 DIAGNOSIS — N186 End stage renal disease: Secondary | ICD-10-CM | POA: Diagnosis not present

## 2015-05-21 DIAGNOSIS — N186 End stage renal disease: Secondary | ICD-10-CM | POA: Diagnosis not present

## 2015-05-21 DIAGNOSIS — Z992 Dependence on renal dialysis: Secondary | ICD-10-CM | POA: Diagnosis not present

## 2015-05-22 DIAGNOSIS — N2581 Secondary hyperparathyroidism of renal origin: Secondary | ICD-10-CM | POA: Diagnosis not present

## 2015-05-22 DIAGNOSIS — D631 Anemia in chronic kidney disease: Secondary | ICD-10-CM | POA: Diagnosis not present

## 2015-05-22 DIAGNOSIS — E1139 Type 2 diabetes mellitus with other diabetic ophthalmic complication: Secondary | ICD-10-CM | POA: Diagnosis not present

## 2015-05-22 DIAGNOSIS — N186 End stage renal disease: Secondary | ICD-10-CM | POA: Diagnosis not present

## 2015-05-24 DIAGNOSIS — N186 End stage renal disease: Secondary | ICD-10-CM | POA: Diagnosis not present

## 2015-05-24 DIAGNOSIS — D631 Anemia in chronic kidney disease: Secondary | ICD-10-CM | POA: Diagnosis not present

## 2015-05-24 DIAGNOSIS — N2581 Secondary hyperparathyroidism of renal origin: Secondary | ICD-10-CM | POA: Diagnosis not present

## 2015-05-27 DIAGNOSIS — N2581 Secondary hyperparathyroidism of renal origin: Secondary | ICD-10-CM | POA: Diagnosis not present

## 2015-05-27 DIAGNOSIS — D631 Anemia in chronic kidney disease: Secondary | ICD-10-CM | POA: Diagnosis not present

## 2015-05-27 DIAGNOSIS — N186 End stage renal disease: Secondary | ICD-10-CM | POA: Diagnosis not present

## 2015-05-29 DIAGNOSIS — D631 Anemia in chronic kidney disease: Secondary | ICD-10-CM | POA: Diagnosis not present

## 2015-05-29 DIAGNOSIS — N186 End stage renal disease: Secondary | ICD-10-CM | POA: Diagnosis not present

## 2015-05-29 DIAGNOSIS — N2581 Secondary hyperparathyroidism of renal origin: Secondary | ICD-10-CM | POA: Diagnosis not present

## 2015-05-31 DIAGNOSIS — D631 Anemia in chronic kidney disease: Secondary | ICD-10-CM | POA: Diagnosis not present

## 2015-05-31 DIAGNOSIS — N2581 Secondary hyperparathyroidism of renal origin: Secondary | ICD-10-CM | POA: Diagnosis not present

## 2015-05-31 DIAGNOSIS — N186 End stage renal disease: Secondary | ICD-10-CM | POA: Diagnosis not present

## 2015-06-02 DIAGNOSIS — I120 Hypertensive chronic kidney disease with stage 5 chronic kidney disease or end stage renal disease: Secondary | ICD-10-CM | POA: Diagnosis not present

## 2015-06-02 DIAGNOSIS — E1121 Type 2 diabetes mellitus with diabetic nephropathy: Secondary | ICD-10-CM | POA: Diagnosis not present

## 2015-06-02 DIAGNOSIS — I5022 Chronic systolic (congestive) heart failure: Secondary | ICD-10-CM | POA: Diagnosis not present

## 2015-06-02 DIAGNOSIS — N186 End stage renal disease: Secondary | ICD-10-CM | POA: Diagnosis not present

## 2015-06-02 DIAGNOSIS — I509 Heart failure, unspecified: Secondary | ICD-10-CM | POA: Diagnosis not present

## 2015-06-03 DIAGNOSIS — D631 Anemia in chronic kidney disease: Secondary | ICD-10-CM | POA: Diagnosis not present

## 2015-06-03 DIAGNOSIS — N2581 Secondary hyperparathyroidism of renal origin: Secondary | ICD-10-CM | POA: Diagnosis not present

## 2015-06-03 DIAGNOSIS — N186 End stage renal disease: Secondary | ICD-10-CM | POA: Diagnosis not present

## 2015-06-05 DIAGNOSIS — D631 Anemia in chronic kidney disease: Secondary | ICD-10-CM | POA: Diagnosis not present

## 2015-06-05 DIAGNOSIS — N186 End stage renal disease: Secondary | ICD-10-CM | POA: Diagnosis not present

## 2015-06-05 DIAGNOSIS — N2581 Secondary hyperparathyroidism of renal origin: Secondary | ICD-10-CM | POA: Diagnosis not present

## 2015-06-07 DIAGNOSIS — N186 End stage renal disease: Secondary | ICD-10-CM | POA: Diagnosis not present

## 2015-06-07 DIAGNOSIS — N2581 Secondary hyperparathyroidism of renal origin: Secondary | ICD-10-CM | POA: Diagnosis not present

## 2015-06-07 DIAGNOSIS — D631 Anemia in chronic kidney disease: Secondary | ICD-10-CM | POA: Diagnosis not present

## 2015-06-10 DIAGNOSIS — D631 Anemia in chronic kidney disease: Secondary | ICD-10-CM | POA: Diagnosis not present

## 2015-06-10 DIAGNOSIS — N2581 Secondary hyperparathyroidism of renal origin: Secondary | ICD-10-CM | POA: Diagnosis not present

## 2015-06-10 DIAGNOSIS — N186 End stage renal disease: Secondary | ICD-10-CM | POA: Diagnosis not present

## 2015-06-11 ENCOUNTER — Encounter: Payer: Self-pay | Admitting: Interventional Cardiology

## 2015-06-11 ENCOUNTER — Ambulatory Visit (INDEPENDENT_AMBULATORY_CARE_PROVIDER_SITE_OTHER): Payer: Medicare Other | Admitting: Interventional Cardiology

## 2015-06-11 VITALS — BP 98/60 | HR 99 | Ht 65.0 in | Wt 240.0 lb

## 2015-06-11 DIAGNOSIS — I428 Other cardiomyopathies: Secondary | ICD-10-CM

## 2015-06-11 DIAGNOSIS — I429 Cardiomyopathy, unspecified: Secondary | ICD-10-CM | POA: Diagnosis not present

## 2015-06-11 DIAGNOSIS — E118 Type 2 diabetes mellitus with unspecified complications: Secondary | ICD-10-CM | POA: Diagnosis not present

## 2015-06-11 DIAGNOSIS — Z794 Long term (current) use of insulin: Secondary | ICD-10-CM

## 2015-06-11 DIAGNOSIS — I10 Essential (primary) hypertension: Secondary | ICD-10-CM

## 2015-06-11 DIAGNOSIS — Z992 Dependence on renal dialysis: Secondary | ICD-10-CM

## 2015-06-11 DIAGNOSIS — E1122 Type 2 diabetes mellitus with diabetic chronic kidney disease: Secondary | ICD-10-CM | POA: Diagnosis not present

## 2015-06-11 DIAGNOSIS — I5022 Chronic systolic (congestive) heart failure: Secondary | ICD-10-CM | POA: Diagnosis not present

## 2015-06-11 DIAGNOSIS — N186 End stage renal disease: Secondary | ICD-10-CM

## 2015-06-11 NOTE — Progress Notes (Signed)
Cardiology Office Note   Date:  06/11/2015   ID:  [name], DOB 1965/10/23, MRN [id number]  PCP:  Leola Brazil, MD  Cardiologist:  Sinclair Grooms, MD   Chief Complaint  Patient presents with  . Congestive Heart Failure      History of Present Illness: [name] [name] is a 50 y.o. male who presents for non-ischemic cardiomyopathy, ESRD, DM II, and obesity. He has 2 sets of specialist. He has a nephrologist, Dr. Brayton El in [facility]. He is also is being seen by nephrology at [facility] and is on the list for transplantation.[name] He also sees a cardiologist at [facility], Dr. [name].  He returns today to maintain a cardiology relationship. He denies dyspnea. Current medication regimen list is incorrect. He has no longer using carvedilol or BiDil. I do not believe that Dr. Quentin Cornwall at [facility] is aware that beta blocker therapy was discontinued by the patient. Despite absence of these medications, he denies dyspnea, is able lie flat, and has no peripheral edema. Heart disease been much better controlled since dialysis was started. Last LVEF in our system was 45% last summer. He has had a recent assessment by Dr. [name] and states that LV function was stable although he can't give any details and there is no data in [place].  He continues to look forward to kidney transplantation.    Past Medical History  Diagnosis Date  . Hypertension   . Ischemic cardiomyopathy     per echo 07-01-2014  ef 45%  . DM type 2 causing ESRD [facility])     Nephrologist-- dr [name] [facility])--  on hemodialysis since June 2012 at  [name]  TTS  . Hemodialysis patient [facility])     at [place] on Tues/ Thur/Sat/schedule  . Anal infection     posterior anal canal  . History of pleural effusion     bilateral  . LAFB (left anterior fascicular block)   . Hyperparathyroidism, secondary renal ([place])   . Anemia,  chronic renal failure   . Hemorrhoids   . Systolic and diastolic CHF, chronic ([place])     CARDIOLOGIST-  DR Daneen Schick ([place])  AND DR Shela Leff ROBINSON ([name])  . Atrophic kidney     BILATERAL  . Hepatitis B antibody positive   . Peripheral neuropathy [facility])     Past Surgical History  Procedure Laterality Date  . Appendectomy  09-12-2004    laparotomy w/ drainage peritinitis  . Av fistula placement  02-27-2010    right forearm (RADIOCEPHALIC)  . Av fistula repair  10-30-2010  . Cardiovascular stress test  10-29-2011   dr Daneen Schick    Low risk scan;  mild perfusion defect seen in the basal inferoseptal, basal inferior and mid inferior regions consistent with an infarct/scar and/or overlying attenuation/  mild to moderate global LVSF,  ef 40-45%  . Dobutamine stress echo  07-23-2012   [name]    abnormal ;  at rest estimated lvef 25-30% and global severe LV hypokinesis ;  no cp during stress and achieved 85% maxium predicted heart rate;  negative stress ECG for inducible ischemia;  estimated lvef with stress 35-40%;  augmentation of wall segments consistant with cardiomyopathy and differential fibrosis  . Transthoracic echocardiogram  07-01-2014    done at [facility]    grade 1 diastolic dysfunction,  ef 45%/  trace TR and PR  .  Retinal detachment surgery Left 2011    incomplete repair/ needs eye drops to keep pressure down  . Incision and drainage abscess N/A 03/26/2015    Procedure: ANAL INCISION AND DRAINAGE;  Surgeon: [name], MD;  Location: [facility];  Service: General;  Laterality: N/A;  . Fistulotomy N/A 03/26/2015    Procedure: FISTULOTOMY;  Surgeon: [name], MD;  Location: [facility];  Service: General;  Laterality: N/A;     Current Outpatient Prescriptions  Medication Sig Dispense Refill  . aspirin 81 MG tablet Take 81 mg by mouth daily.    . Calcium Acetate, Phos Binder, 667 MG CAPS Take 2  capsules by mouth 3 (three) times daily with meals.     . carvedilol (COREG) 6.25 MG tablet TAKE 1 TABLET (6.25 MG TOTAL) BY MOUTH 2 (TWO) TIMES DAILY WITH A MEAL. 60 tablet 1  . cinacalcet (SENSIPAR) 60 MG tablet Take 60 mg by mouth daily.    . clonazePAM (KLONOPIN) 0.5 MG tablet Take 0.5 mg by mouth at bedtime.    . febuxostat (ULORIC) 40 MG tablet Take 80 mg by mouth daily.    [name] gabapentin (NEURONTIN) 300 MG capsule Take 300 mg by mouth 3 (three) times daily as needed. For pain  3  . Insulin Aspart Prot & Aspart (NOVOLOG MIX 70/30 PENFILL [place]) Inject 14 Units into the skin 2 (two) times daily after a meal. Takes 14 units am and evening Adjusts dose if CBG greater than 150    . Multiple Vitamins-Minerals (MEGA MULTIVITAMIN FOR MEN PO) Take by mouth.    . sevelamer carbonate (RENVELA) 800 MG tablet Take 800 mg by mouth 3 (three) times daily with meals.    . Timolol Maleate (ISTALOL) 0.5 % (DAILY) SOLN Place 1 drop into the left eye daily.     No current facility-administered medications for this visit.    Allergies:   Review of patient's allergies indicates no known allergies.    Social History:  The patient  reports that he has never smoked. He does not have any smokeless tobacco history on file. He reports that he does not drink alcohol or use illicit drugs.   Family History:  The patient's Family history is unknown by patient.    ROS:  Please see the history of present illness.   Otherwise, review of systems are positive for suspicious of healthcare provide this and compares opinions among his specialists. No specific complaints other than arthritis in his left knee..   All other systems are reviewed and negative.    PHYSICAL EXAM: VS:  BP 98/60 mmHg  Pulse 99  Ht 5\' 5"  (1.651 m)  Wt 240 lb (108.863 kg)  BMI 39.94 kg/m2  SpO2 92% , BMI Body mass index is 39.94 kg/(m^2). GEN: Well nourished, well developed, in no acute distress. Still with significant obesity HEENT: normal Neck:  no JVD, carotid bruits, or masses Cardiac: RRR.  There is no murmur, rub, or gallop. There is no edema. Respiratory:  clear to auscultation bilaterally, normal work of breathing. GI: soft, nontender, nondistended, + BS MS: no deformity or atrophy Skin: warm and dry, no rash Neuro:  Strength and sensation are intact Psych: euthymic mood, full affect   EKG:  EKG is ordered today. The ekg reveals normal sinus rhythm with right axis deviation and biatrial abnormality. Right axis is likely related to left posterior hemiblock.   Recent Labs: 03/26/2015: Hemoglobin 17.3*; Potassium 4.5; Sodium 140    Lipid Panel  No results found for: CHOL, TRIG, HDL, CHOLHDL, VLDL, LDLCALC, LDLDIRECT    Wt Readings from Last 3 Encounters:  06/11/15 240 lb (108.863 kg)  03/26/15 237 lb (107.502 kg)  05/03/14 237 lb 1.9 oz (107.557 kg)      Other studies Reviewed: Additional studies/ records that were reviewed today include: Reviewed notes in [place]. The findings include no documentation of systolic function even though it is noted he saw Dr. [name]. Prior myocardial perfusion study from [facility] in 2013 revealed an intermediate risk study with a mild fixed inferior defect and EF of 37%.    ASSESSMENT AND PLAN:  1. Chronic systolic heart failure ([name]) No clinical evidence of volume overload. S3 gallop is resolved. Neck veins are flat. LV function is certainly not terrible based upon clinical exam and may still be in the range that we last noted which was above 40%. Etiology presumed related to volume overload from kidney failure.  2. End-stage kidney disease On dialysis  3. Essential hypertension Blood pressures are relatively low  4. Type 2 diabetes mellitus with complication, with long-term current use of insulin ([name]) Status of control is unknown  5. Non-ischemic cardiomyopathy ([place]) Previously documented to have an intermediate risk myocardial perfusion study 2013. No  ischemia. EF 37%.    Current medicines are reviewed at length with the patient today.  The patient has the following concerns regarding medicines: none. I discussed the possibility that LV systolic dysfunction could recur off medical therapy..  The following changes/actions have been instituted:    Not currently on any therapy. He is discontinue beta blocker and BiDil therapy without medical consent. He did this because he continued to have blood pressure difficulty on dialysis.  He wants to continue off heart failure therapy for the time being.  Labs/ tests ordered today include:  No orders of the defined types were placed in this encounter.     Disposition:   FU with HS in 6 months  Signed, Sinclair Grooms, MD  06/11/2015 10:27 AM    [facility] [address], [place], [place]  [address] Phone: [phone]; Fax: [phone]

## 2015-06-11 NOTE — Patient Instructions (Signed)
Medication Instructions:  Your physician recommends that you continue on your current medications as directed. Please refer to the Current Medication list given to you today.   Labwork: None ordered  Testing/Procedures: None ordered  Follow-Up: Your physician wants you to follow-up in: 6-9 months with Dr.Smith You will receive a reminder letter in the mail two months in advance. If you don't receive a letter, please call our office to schedule the follow-up appointment.   Any Other Special Instructions Will Be Listed Below (If Applicable).     If you need a refill on your cardiac medications before your next appointment, please call your pharmacy.   

## 2015-06-12 DIAGNOSIS — D631 Anemia in chronic kidney disease: Secondary | ICD-10-CM | POA: Diagnosis not present

## 2015-06-12 DIAGNOSIS — N2581 Secondary hyperparathyroidism of renal origin: Secondary | ICD-10-CM | POA: Diagnosis not present

## 2015-06-12 DIAGNOSIS — N186 End stage renal disease: Secondary | ICD-10-CM | POA: Diagnosis not present

## 2015-06-14 DIAGNOSIS — N2581 Secondary hyperparathyroidism of renal origin: Secondary | ICD-10-CM | POA: Diagnosis not present

## 2015-06-14 DIAGNOSIS — D631 Anemia in chronic kidney disease: Secondary | ICD-10-CM | POA: Diagnosis not present

## 2015-06-14 DIAGNOSIS — N186 End stage renal disease: Secondary | ICD-10-CM | POA: Diagnosis not present

## 2015-06-17 DIAGNOSIS — D631 Anemia in chronic kidney disease: Secondary | ICD-10-CM | POA: Diagnosis not present

## 2015-06-17 DIAGNOSIS — N186 End stage renal disease: Secondary | ICD-10-CM | POA: Diagnosis not present

## 2015-06-17 DIAGNOSIS — N2581 Secondary hyperparathyroidism of renal origin: Secondary | ICD-10-CM | POA: Diagnosis not present

## 2015-06-19 DIAGNOSIS — D631 Anemia in chronic kidney disease: Secondary | ICD-10-CM | POA: Diagnosis not present

## 2015-06-19 DIAGNOSIS — N186 End stage renal disease: Secondary | ICD-10-CM | POA: Diagnosis not present

## 2015-06-19 DIAGNOSIS — N2581 Secondary hyperparathyroidism of renal origin: Secondary | ICD-10-CM | POA: Diagnosis not present

## 2015-06-21 DIAGNOSIS — D631 Anemia in chronic kidney disease: Secondary | ICD-10-CM | POA: Diagnosis not present

## 2015-06-21 DIAGNOSIS — N186 End stage renal disease: Secondary | ICD-10-CM | POA: Diagnosis not present

## 2015-06-21 DIAGNOSIS — Z992 Dependence on renal dialysis: Secondary | ICD-10-CM | POA: Diagnosis not present

## 2015-06-21 DIAGNOSIS — N2581 Secondary hyperparathyroidism of renal origin: Secondary | ICD-10-CM | POA: Diagnosis not present

## 2015-06-24 DIAGNOSIS — N186 End stage renal disease: Secondary | ICD-10-CM | POA: Diagnosis not present

## 2015-06-24 DIAGNOSIS — D631 Anemia in chronic kidney disease: Secondary | ICD-10-CM | POA: Diagnosis not present

## 2015-06-24 DIAGNOSIS — N2581 Secondary hyperparathyroidism of renal origin: Secondary | ICD-10-CM | POA: Diagnosis not present

## 2015-06-26 DIAGNOSIS — M109 Gout, unspecified: Secondary | ICD-10-CM | POA: Diagnosis not present

## 2015-06-26 DIAGNOSIS — N2581 Secondary hyperparathyroidism of renal origin: Secondary | ICD-10-CM | POA: Diagnosis not present

## 2015-06-26 DIAGNOSIS — E1139 Type 2 diabetes mellitus with other diabetic ophthalmic complication: Secondary | ICD-10-CM | POA: Diagnosis not present

## 2015-06-26 DIAGNOSIS — D631 Anemia in chronic kidney disease: Secondary | ICD-10-CM | POA: Diagnosis not present

## 2015-06-26 DIAGNOSIS — N186 End stage renal disease: Secondary | ICD-10-CM | POA: Diagnosis not present

## 2015-06-28 DIAGNOSIS — N2581 Secondary hyperparathyroidism of renal origin: Secondary | ICD-10-CM | POA: Diagnosis not present

## 2015-06-28 DIAGNOSIS — D631 Anemia in chronic kidney disease: Secondary | ICD-10-CM | POA: Diagnosis not present

## 2015-06-28 DIAGNOSIS — N186 End stage renal disease: Secondary | ICD-10-CM | POA: Diagnosis not present

## 2015-07-01 DIAGNOSIS — N2581 Secondary hyperparathyroidism of renal origin: Secondary | ICD-10-CM | POA: Diagnosis not present

## 2015-07-01 DIAGNOSIS — D631 Anemia in chronic kidney disease: Secondary | ICD-10-CM | POA: Diagnosis not present

## 2015-07-01 DIAGNOSIS — N186 End stage renal disease: Secondary | ICD-10-CM | POA: Diagnosis not present

## 2015-07-03 DIAGNOSIS — N2581 Secondary hyperparathyroidism of renal origin: Secondary | ICD-10-CM | POA: Diagnosis not present

## 2015-07-03 DIAGNOSIS — N186 End stage renal disease: Secondary | ICD-10-CM | POA: Diagnosis not present

## 2015-07-03 DIAGNOSIS — D631 Anemia in chronic kidney disease: Secondary | ICD-10-CM | POA: Diagnosis not present

## 2015-07-05 DIAGNOSIS — D631 Anemia in chronic kidney disease: Secondary | ICD-10-CM | POA: Diagnosis not present

## 2015-07-05 DIAGNOSIS — N2581 Secondary hyperparathyroidism of renal origin: Secondary | ICD-10-CM | POA: Diagnosis not present

## 2015-07-05 DIAGNOSIS — N186 End stage renal disease: Secondary | ICD-10-CM | POA: Diagnosis not present

## 2015-07-08 DIAGNOSIS — D631 Anemia in chronic kidney disease: Secondary | ICD-10-CM | POA: Diagnosis not present

## 2015-07-08 DIAGNOSIS — N2581 Secondary hyperparathyroidism of renal origin: Secondary | ICD-10-CM | POA: Diagnosis not present

## 2015-07-08 DIAGNOSIS — N186 End stage renal disease: Secondary | ICD-10-CM | POA: Diagnosis not present

## 2015-07-10 ENCOUNTER — Other Ambulatory Visit: Payer: Self-pay | Admitting: Interventional Cardiology

## 2015-07-10 DIAGNOSIS — D631 Anemia in chronic kidney disease: Secondary | ICD-10-CM | POA: Diagnosis not present

## 2015-07-10 DIAGNOSIS — N2581 Secondary hyperparathyroidism of renal origin: Secondary | ICD-10-CM | POA: Diagnosis not present

## 2015-07-10 DIAGNOSIS — N186 End stage renal disease: Secondary | ICD-10-CM | POA: Diagnosis not present

## 2015-07-12 DIAGNOSIS — D631 Anemia in chronic kidney disease: Secondary | ICD-10-CM | POA: Diagnosis not present

## 2015-07-12 DIAGNOSIS — N2581 Secondary hyperparathyroidism of renal origin: Secondary | ICD-10-CM | POA: Diagnosis not present

## 2015-07-12 DIAGNOSIS — N186 End stage renal disease: Secondary | ICD-10-CM | POA: Diagnosis not present

## 2015-07-15 DIAGNOSIS — N186 End stage renal disease: Secondary | ICD-10-CM | POA: Diagnosis not present

## 2015-07-15 DIAGNOSIS — N2581 Secondary hyperparathyroidism of renal origin: Secondary | ICD-10-CM | POA: Diagnosis not present

## 2015-07-15 DIAGNOSIS — D631 Anemia in chronic kidney disease: Secondary | ICD-10-CM | POA: Diagnosis not present

## 2015-07-17 DIAGNOSIS — D631 Anemia in chronic kidney disease: Secondary | ICD-10-CM | POA: Diagnosis not present

## 2015-07-17 DIAGNOSIS — N186 End stage renal disease: Secondary | ICD-10-CM | POA: Diagnosis not present

## 2015-07-17 DIAGNOSIS — N2581 Secondary hyperparathyroidism of renal origin: Secondary | ICD-10-CM | POA: Diagnosis not present

## 2015-07-19 DIAGNOSIS — D631 Anemia in chronic kidney disease: Secondary | ICD-10-CM | POA: Diagnosis not present

## 2015-07-19 DIAGNOSIS — N186 End stage renal disease: Secondary | ICD-10-CM | POA: Diagnosis not present

## 2015-07-19 DIAGNOSIS — N2581 Secondary hyperparathyroidism of renal origin: Secondary | ICD-10-CM | POA: Diagnosis not present

## 2015-07-22 DIAGNOSIS — N186 End stage renal disease: Secondary | ICD-10-CM | POA: Diagnosis not present

## 2015-07-22 DIAGNOSIS — D509 Iron deficiency anemia, unspecified: Secondary | ICD-10-CM | POA: Diagnosis not present

## 2015-07-22 DIAGNOSIS — D631 Anemia in chronic kidney disease: Secondary | ICD-10-CM | POA: Diagnosis not present

## 2015-07-22 DIAGNOSIS — N2581 Secondary hyperparathyroidism of renal origin: Secondary | ICD-10-CM | POA: Diagnosis not present

## 2015-07-24 DIAGNOSIS — E1139 Type 2 diabetes mellitus with other diabetic ophthalmic complication: Secondary | ICD-10-CM | POA: Diagnosis not present

## 2015-07-24 DIAGNOSIS — D509 Iron deficiency anemia, unspecified: Secondary | ICD-10-CM | POA: Diagnosis not present

## 2015-07-24 DIAGNOSIS — D631 Anemia in chronic kidney disease: Secondary | ICD-10-CM | POA: Diagnosis not present

## 2015-07-24 DIAGNOSIS — N2581 Secondary hyperparathyroidism of renal origin: Secondary | ICD-10-CM | POA: Diagnosis not present

## 2015-07-24 DIAGNOSIS — N186 End stage renal disease: Secondary | ICD-10-CM | POA: Diagnosis not present

## 2015-07-26 DIAGNOSIS — N2581 Secondary hyperparathyroidism of renal origin: Secondary | ICD-10-CM | POA: Diagnosis not present

## 2015-07-26 DIAGNOSIS — D509 Iron deficiency anemia, unspecified: Secondary | ICD-10-CM | POA: Diagnosis not present

## 2015-07-26 DIAGNOSIS — D631 Anemia in chronic kidney disease: Secondary | ICD-10-CM | POA: Diagnosis not present

## 2015-07-26 DIAGNOSIS — N186 End stage renal disease: Secondary | ICD-10-CM | POA: Diagnosis not present

## 2015-07-29 DIAGNOSIS — D509 Iron deficiency anemia, unspecified: Secondary | ICD-10-CM | POA: Diagnosis not present

## 2015-07-29 DIAGNOSIS — N2581 Secondary hyperparathyroidism of renal origin: Secondary | ICD-10-CM | POA: Diagnosis not present

## 2015-07-29 DIAGNOSIS — D631 Anemia in chronic kidney disease: Secondary | ICD-10-CM | POA: Diagnosis not present

## 2015-07-29 DIAGNOSIS — N186 End stage renal disease: Secondary | ICD-10-CM | POA: Diagnosis not present

## 2015-07-31 DIAGNOSIS — D631 Anemia in chronic kidney disease: Secondary | ICD-10-CM | POA: Diagnosis not present

## 2015-07-31 DIAGNOSIS — N186 End stage renal disease: Secondary | ICD-10-CM | POA: Diagnosis not present

## 2015-07-31 DIAGNOSIS — N2581 Secondary hyperparathyroidism of renal origin: Secondary | ICD-10-CM | POA: Diagnosis not present

## 2015-07-31 DIAGNOSIS — D509 Iron deficiency anemia, unspecified: Secondary | ICD-10-CM | POA: Diagnosis not present

## 2015-08-02 DIAGNOSIS — D631 Anemia in chronic kidney disease: Secondary | ICD-10-CM | POA: Diagnosis not present

## 2015-08-02 DIAGNOSIS — N2581 Secondary hyperparathyroidism of renal origin: Secondary | ICD-10-CM | POA: Diagnosis not present

## 2015-08-02 DIAGNOSIS — D509 Iron deficiency anemia, unspecified: Secondary | ICD-10-CM | POA: Diagnosis not present

## 2015-08-02 DIAGNOSIS — N186 End stage renal disease: Secondary | ICD-10-CM | POA: Diagnosis not present

## 2015-08-05 DIAGNOSIS — N186 End stage renal disease: Secondary | ICD-10-CM | POA: Diagnosis not present

## 2015-08-05 DIAGNOSIS — D509 Iron deficiency anemia, unspecified: Secondary | ICD-10-CM | POA: Diagnosis not present

## 2015-08-05 DIAGNOSIS — N2581 Secondary hyperparathyroidism of renal origin: Secondary | ICD-10-CM | POA: Diagnosis not present

## 2015-08-05 DIAGNOSIS — D631 Anemia in chronic kidney disease: Secondary | ICD-10-CM | POA: Diagnosis not present

## 2015-08-07 DIAGNOSIS — N186 End stage renal disease: Secondary | ICD-10-CM | POA: Diagnosis not present

## 2015-08-07 DIAGNOSIS — D509 Iron deficiency anemia, unspecified: Secondary | ICD-10-CM | POA: Diagnosis not present

## 2015-08-07 DIAGNOSIS — N2581 Secondary hyperparathyroidism of renal origin: Secondary | ICD-10-CM | POA: Diagnosis not present

## 2015-08-07 DIAGNOSIS — D631 Anemia in chronic kidney disease: Secondary | ICD-10-CM | POA: Diagnosis not present

## 2015-08-09 DIAGNOSIS — D631 Anemia in chronic kidney disease: Secondary | ICD-10-CM | POA: Diagnosis not present

## 2015-08-09 DIAGNOSIS — N186 End stage renal disease: Secondary | ICD-10-CM | POA: Diagnosis not present

## 2015-08-09 DIAGNOSIS — N2581 Secondary hyperparathyroidism of renal origin: Secondary | ICD-10-CM | POA: Diagnosis not present

## 2015-08-09 DIAGNOSIS — D509 Iron deficiency anemia, unspecified: Secondary | ICD-10-CM | POA: Diagnosis not present

## 2015-08-12 DIAGNOSIS — D631 Anemia in chronic kidney disease: Secondary | ICD-10-CM | POA: Diagnosis not present

## 2015-08-12 DIAGNOSIS — N2581 Secondary hyperparathyroidism of renal origin: Secondary | ICD-10-CM | POA: Diagnosis not present

## 2015-08-12 DIAGNOSIS — N186 End stage renal disease: Secondary | ICD-10-CM | POA: Diagnosis not present

## 2015-08-12 DIAGNOSIS — D509 Iron deficiency anemia, unspecified: Secondary | ICD-10-CM | POA: Diagnosis not present

## 2015-08-14 DIAGNOSIS — N2581 Secondary hyperparathyroidism of renal origin: Secondary | ICD-10-CM | POA: Diagnosis not present

## 2015-08-14 DIAGNOSIS — N186 End stage renal disease: Secondary | ICD-10-CM | POA: Diagnosis not present

## 2015-08-14 DIAGNOSIS — D509 Iron deficiency anemia, unspecified: Secondary | ICD-10-CM | POA: Diagnosis not present

## 2015-08-14 DIAGNOSIS — D631 Anemia in chronic kidney disease: Secondary | ICD-10-CM | POA: Diagnosis not present

## 2015-08-16 DIAGNOSIS — N2581 Secondary hyperparathyroidism of renal origin: Secondary | ICD-10-CM | POA: Diagnosis not present

## 2015-08-16 DIAGNOSIS — N186 End stage renal disease: Secondary | ICD-10-CM | POA: Diagnosis not present

## 2015-08-16 DIAGNOSIS — D509 Iron deficiency anemia, unspecified: Secondary | ICD-10-CM | POA: Diagnosis not present

## 2015-08-16 DIAGNOSIS — D631 Anemia in chronic kidney disease: Secondary | ICD-10-CM | POA: Diagnosis not present

## 2015-08-19 DIAGNOSIS — N186 End stage renal disease: Secondary | ICD-10-CM | POA: Diagnosis not present

## 2015-08-19 DIAGNOSIS — D509 Iron deficiency anemia, unspecified: Secondary | ICD-10-CM | POA: Diagnosis not present

## 2015-08-19 DIAGNOSIS — N2581 Secondary hyperparathyroidism of renal origin: Secondary | ICD-10-CM | POA: Diagnosis not present

## 2015-08-19 DIAGNOSIS — D631 Anemia in chronic kidney disease: Secondary | ICD-10-CM | POA: Diagnosis not present

## 2015-08-20 DIAGNOSIS — N186 End stage renal disease: Secondary | ICD-10-CM | POA: Diagnosis not present

## 2015-08-20 DIAGNOSIS — Z992 Dependence on renal dialysis: Secondary | ICD-10-CM | POA: Diagnosis not present

## 2015-08-21 DIAGNOSIS — N2581 Secondary hyperparathyroidism of renal origin: Secondary | ICD-10-CM | POA: Diagnosis not present

## 2015-08-21 DIAGNOSIS — D631 Anemia in chronic kidney disease: Secondary | ICD-10-CM | POA: Diagnosis not present

## 2015-08-21 DIAGNOSIS — N186 End stage renal disease: Secondary | ICD-10-CM | POA: Diagnosis not present

## 2015-08-23 DIAGNOSIS — D631 Anemia in chronic kidney disease: Secondary | ICD-10-CM | POA: Diagnosis not present

## 2015-08-23 DIAGNOSIS — N186 End stage renal disease: Secondary | ICD-10-CM | POA: Diagnosis not present

## 2015-08-23 DIAGNOSIS — N2581 Secondary hyperparathyroidism of renal origin: Secondary | ICD-10-CM | POA: Diagnosis not present

## 2015-08-26 DIAGNOSIS — N2581 Secondary hyperparathyroidism of renal origin: Secondary | ICD-10-CM | POA: Diagnosis not present

## 2015-08-26 DIAGNOSIS — N186 End stage renal disease: Secondary | ICD-10-CM | POA: Diagnosis not present

## 2015-08-26 DIAGNOSIS — D631 Anemia in chronic kidney disease: Secondary | ICD-10-CM | POA: Diagnosis not present

## 2015-08-27 ENCOUNTER — Ambulatory Visit (INDEPENDENT_AMBULATORY_CARE_PROVIDER_SITE_OTHER): Payer: Medicare Other | Admitting: Ophthalmology

## 2015-08-28 DIAGNOSIS — E1139 Type 2 diabetes mellitus with other diabetic ophthalmic complication: Secondary | ICD-10-CM | POA: Diagnosis not present

## 2015-08-28 DIAGNOSIS — N186 End stage renal disease: Secondary | ICD-10-CM | POA: Diagnosis not present

## 2015-08-28 DIAGNOSIS — N2581 Secondary hyperparathyroidism of renal origin: Secondary | ICD-10-CM | POA: Diagnosis not present

## 2015-08-28 DIAGNOSIS — D631 Anemia in chronic kidney disease: Secondary | ICD-10-CM | POA: Diagnosis not present

## 2015-08-30 DIAGNOSIS — N186 End stage renal disease: Secondary | ICD-10-CM | POA: Diagnosis not present

## 2015-08-30 DIAGNOSIS — N2581 Secondary hyperparathyroidism of renal origin: Secondary | ICD-10-CM | POA: Diagnosis not present

## 2015-08-30 DIAGNOSIS — D631 Anemia in chronic kidney disease: Secondary | ICD-10-CM | POA: Diagnosis not present

## 2015-09-02 DIAGNOSIS — N2581 Secondary hyperparathyroidism of renal origin: Secondary | ICD-10-CM | POA: Diagnosis not present

## 2015-09-02 DIAGNOSIS — N186 End stage renal disease: Secondary | ICD-10-CM | POA: Diagnosis not present

## 2015-09-02 DIAGNOSIS — D631 Anemia in chronic kidney disease: Secondary | ICD-10-CM | POA: Diagnosis not present

## 2015-09-04 DIAGNOSIS — N2581 Secondary hyperparathyroidism of renal origin: Secondary | ICD-10-CM | POA: Diagnosis not present

## 2015-09-04 DIAGNOSIS — D631 Anemia in chronic kidney disease: Secondary | ICD-10-CM | POA: Diagnosis not present

## 2015-09-04 DIAGNOSIS — N186 End stage renal disease: Secondary | ICD-10-CM | POA: Diagnosis not present

## 2015-09-06 DIAGNOSIS — N2581 Secondary hyperparathyroidism of renal origin: Secondary | ICD-10-CM | POA: Diagnosis not present

## 2015-09-06 DIAGNOSIS — N186 End stage renal disease: Secondary | ICD-10-CM | POA: Diagnosis not present

## 2015-09-06 DIAGNOSIS — D631 Anemia in chronic kidney disease: Secondary | ICD-10-CM | POA: Diagnosis not present

## 2015-09-09 DIAGNOSIS — N2581 Secondary hyperparathyroidism of renal origin: Secondary | ICD-10-CM | POA: Diagnosis not present

## 2015-09-09 DIAGNOSIS — N186 End stage renal disease: Secondary | ICD-10-CM | POA: Diagnosis not present

## 2015-09-09 DIAGNOSIS — D631 Anemia in chronic kidney disease: Secondary | ICD-10-CM | POA: Diagnosis not present

## 2015-09-11 ENCOUNTER — Telehealth: Payer: Self-pay | Admitting: Interventional Cardiology

## 2015-09-11 DIAGNOSIS — N2581 Secondary hyperparathyroidism of renal origin: Secondary | ICD-10-CM | POA: Diagnosis not present

## 2015-09-11 DIAGNOSIS — D631 Anemia in chronic kidney disease: Secondary | ICD-10-CM | POA: Diagnosis not present

## 2015-09-11 DIAGNOSIS — N186 End stage renal disease: Secondary | ICD-10-CM | POA: Diagnosis not present

## 2015-09-11 NOTE — Telephone Encounter (Signed)
Walk in pt form-Sealed Envelope Dropped off [name] [name] back Monday 09/15/15 will give to her then

## 2015-09-13 DIAGNOSIS — N186 End stage renal disease: Secondary | ICD-10-CM | POA: Diagnosis not present

## 2015-09-13 DIAGNOSIS — N2581 Secondary hyperparathyroidism of renal origin: Secondary | ICD-10-CM | POA: Diagnosis not present

## 2015-09-13 DIAGNOSIS — D631 Anemia in chronic kidney disease: Secondary | ICD-10-CM | POA: Diagnosis not present

## 2015-09-15 ENCOUNTER — Ambulatory Visit (INDEPENDENT_AMBULATORY_CARE_PROVIDER_SITE_OTHER): Payer: Medicare Other | Admitting: Ophthalmology

## 2015-09-15 DIAGNOSIS — H2513 Age-related nuclear cataract, bilateral: Secondary | ICD-10-CM

## 2015-09-15 DIAGNOSIS — E113522 Type 2 diabetes mellitus with proliferative diabetic retinopathy with traction retinal detachment involving the macula, left eye: Secondary | ICD-10-CM

## 2015-09-15 DIAGNOSIS — H43813 Vitreous degeneration, bilateral: Secondary | ICD-10-CM | POA: Diagnosis not present

## 2015-09-15 DIAGNOSIS — E113591 Type 2 diabetes mellitus with proliferative diabetic retinopathy without macular edema, right eye: Secondary | ICD-10-CM | POA: Diagnosis not present

## 2015-09-15 DIAGNOSIS — E11319 Type 2 diabetes mellitus with unspecified diabetic retinopathy without macular edema: Secondary | ICD-10-CM | POA: Diagnosis not present

## 2015-09-15 LAB — HM DIABETES EYE EXAM

## 2015-09-16 DIAGNOSIS — N186 End stage renal disease: Secondary | ICD-10-CM | POA: Diagnosis not present

## 2015-09-16 DIAGNOSIS — N2581 Secondary hyperparathyroidism of renal origin: Secondary | ICD-10-CM | POA: Diagnosis not present

## 2015-09-16 DIAGNOSIS — D631 Anemia in chronic kidney disease: Secondary | ICD-10-CM | POA: Diagnosis not present

## 2015-09-18 DIAGNOSIS — N2581 Secondary hyperparathyroidism of renal origin: Secondary | ICD-10-CM | POA: Diagnosis not present

## 2015-09-18 DIAGNOSIS — D631 Anemia in chronic kidney disease: Secondary | ICD-10-CM | POA: Diagnosis not present

## 2015-09-18 DIAGNOSIS — N186 End stage renal disease: Secondary | ICD-10-CM | POA: Diagnosis not present

## 2015-09-19 DIAGNOSIS — Z992 Dependence on renal dialysis: Secondary | ICD-10-CM | POA: Diagnosis not present

## 2015-09-19 DIAGNOSIS — N186 End stage renal disease: Secondary | ICD-10-CM | POA: Diagnosis not present

## 2015-09-20 DIAGNOSIS — D631 Anemia in chronic kidney disease: Secondary | ICD-10-CM | POA: Diagnosis not present

## 2015-09-20 DIAGNOSIS — N186 End stage renal disease: Secondary | ICD-10-CM | POA: Diagnosis not present

## 2015-09-20 DIAGNOSIS — N2581 Secondary hyperparathyroidism of renal origin: Secondary | ICD-10-CM | POA: Diagnosis not present

## 2015-09-23 DIAGNOSIS — N186 End stage renal disease: Secondary | ICD-10-CM | POA: Diagnosis not present

## 2015-09-23 DIAGNOSIS — N2581 Secondary hyperparathyroidism of renal origin: Secondary | ICD-10-CM | POA: Diagnosis not present

## 2015-09-23 DIAGNOSIS — D631 Anemia in chronic kidney disease: Secondary | ICD-10-CM | POA: Diagnosis not present

## 2015-09-25 DIAGNOSIS — D631 Anemia in chronic kidney disease: Secondary | ICD-10-CM | POA: Diagnosis not present

## 2015-09-25 DIAGNOSIS — N2581 Secondary hyperparathyroidism of renal origin: Secondary | ICD-10-CM | POA: Diagnosis not present

## 2015-09-25 DIAGNOSIS — E1139 Type 2 diabetes mellitus with other diabetic ophthalmic complication: Secondary | ICD-10-CM | POA: Diagnosis not present

## 2015-09-25 DIAGNOSIS — M109 Gout, unspecified: Secondary | ICD-10-CM | POA: Diagnosis not present

## 2015-09-25 DIAGNOSIS — N186 End stage renal disease: Secondary | ICD-10-CM | POA: Diagnosis not present

## 2015-09-27 DIAGNOSIS — N186 End stage renal disease: Secondary | ICD-10-CM | POA: Diagnosis not present

## 2015-09-27 DIAGNOSIS — N2581 Secondary hyperparathyroidism of renal origin: Secondary | ICD-10-CM | POA: Diagnosis not present

## 2015-09-27 DIAGNOSIS — D631 Anemia in chronic kidney disease: Secondary | ICD-10-CM | POA: Diagnosis not present

## 2015-09-30 DIAGNOSIS — D631 Anemia in chronic kidney disease: Secondary | ICD-10-CM | POA: Diagnosis not present

## 2015-09-30 DIAGNOSIS — N2581 Secondary hyperparathyroidism of renal origin: Secondary | ICD-10-CM | POA: Diagnosis not present

## 2015-09-30 DIAGNOSIS — N186 End stage renal disease: Secondary | ICD-10-CM | POA: Diagnosis not present

## 2015-10-02 DIAGNOSIS — N186 End stage renal disease: Secondary | ICD-10-CM | POA: Diagnosis not present

## 2015-10-02 DIAGNOSIS — D631 Anemia in chronic kidney disease: Secondary | ICD-10-CM | POA: Diagnosis not present

## 2015-10-02 DIAGNOSIS — N2581 Secondary hyperparathyroidism of renal origin: Secondary | ICD-10-CM | POA: Diagnosis not present

## 2015-10-04 DIAGNOSIS — N2581 Secondary hyperparathyroidism of renal origin: Secondary | ICD-10-CM | POA: Diagnosis not present

## 2015-10-04 DIAGNOSIS — N186 End stage renal disease: Secondary | ICD-10-CM | POA: Diagnosis not present

## 2015-10-04 DIAGNOSIS — D631 Anemia in chronic kidney disease: Secondary | ICD-10-CM | POA: Diagnosis not present

## 2015-10-07 DIAGNOSIS — E875 Hyperkalemia: Secondary | ICD-10-CM | POA: Diagnosis not present

## 2015-10-07 DIAGNOSIS — E785 Hyperlipidemia, unspecified: Secondary | ICD-10-CM | POA: Diagnosis present

## 2015-10-07 DIAGNOSIS — I509 Heart failure, unspecified: Secondary | ICD-10-CM | POA: Diagnosis present

## 2015-10-07 DIAGNOSIS — R571 Hypovolemic shock: Secondary | ICD-10-CM | POA: Diagnosis not present

## 2015-10-07 DIAGNOSIS — I959 Hypotension, unspecified: Secondary | ICD-10-CM | POA: Diagnosis present

## 2015-10-07 DIAGNOSIS — E1140 Type 2 diabetes mellitus with diabetic neuropathy, unspecified: Secondary | ICD-10-CM | POA: Diagnosis present

## 2015-10-07 DIAGNOSIS — I4589 Other specified conduction disorders: Secondary | ICD-10-CM | POA: Diagnosis not present

## 2015-10-07 DIAGNOSIS — E119 Type 2 diabetes mellitus without complications: Secondary | ICD-10-CM | POA: Diagnosis not present

## 2015-10-07 DIAGNOSIS — E1122 Type 2 diabetes mellitus with diabetic chronic kidney disease: Secondary | ICD-10-CM | POA: Diagnosis present

## 2015-10-07 DIAGNOSIS — N186 End stage renal disease: Secondary | ICD-10-CM | POA: Diagnosis not present

## 2015-10-07 DIAGNOSIS — D631 Anemia in chronic kidney disease: Secondary | ICD-10-CM | POA: Diagnosis not present

## 2015-10-07 DIAGNOSIS — R34 Anuria and oliguria: Secondary | ICD-10-CM | POA: Diagnosis not present

## 2015-10-07 DIAGNOSIS — N280 Ischemia and infarction of kidney: Secondary | ICD-10-CM | POA: Diagnosis not present

## 2015-10-07 DIAGNOSIS — N2581 Secondary hyperparathyroidism of renal origin: Secondary | ICD-10-CM | POA: Diagnosis not present

## 2015-10-07 DIAGNOSIS — Z940 Kidney transplant status: Secondary | ICD-10-CM | POA: Diagnosis not present

## 2015-10-07 DIAGNOSIS — I4581 Long QT syndrome: Secondary | ICD-10-CM | POA: Diagnosis not present

## 2015-10-07 DIAGNOSIS — Z7982 Long term (current) use of aspirin: Secondary | ICD-10-CM | POA: Diagnosis not present

## 2015-10-07 DIAGNOSIS — I440 Atrioventricular block, first degree: Secondary | ICD-10-CM | POA: Diagnosis not present

## 2015-10-07 DIAGNOSIS — Z452 Encounter for adjustment and management of vascular access device: Secondary | ICD-10-CM | POA: Diagnosis not present

## 2015-10-07 DIAGNOSIS — Z992 Dependence on renal dialysis: Secondary | ICD-10-CM | POA: Diagnosis not present

## 2015-10-07 DIAGNOSIS — I1311 Hypertensive heart and chronic kidney disease without heart failure, with stage 5 chronic kidney disease, or end stage renal disease: Secondary | ICD-10-CM | POA: Diagnosis present

## 2015-10-07 DIAGNOSIS — Z794 Long term (current) use of insulin: Secondary | ICD-10-CM | POA: Diagnosis not present

## 2015-10-07 DIAGNOSIS — E1121 Type 2 diabetes mellitus with diabetic nephropathy: Secondary | ICD-10-CM | POA: Diagnosis not present

## 2015-10-07 DIAGNOSIS — R944 Abnormal results of kidney function studies: Secondary | ICD-10-CM | POA: Diagnosis not present

## 2015-10-07 DIAGNOSIS — T8619 Other complication of kidney transplant: Secondary | ICD-10-CM | POA: Diagnosis not present

## 2015-10-07 DIAGNOSIS — E872 Acidosis: Secondary | ICD-10-CM | POA: Diagnosis not present

## 2015-10-07 DIAGNOSIS — Z6841 Body Mass Index (BMI) 40.0 and over, adult: Secondary | ICD-10-CM | POA: Diagnosis not present

## 2015-10-07 DIAGNOSIS — I823 Embolism and thrombosis of renal vein: Secondary | ICD-10-CM | POA: Diagnosis not present

## 2015-10-07 DIAGNOSIS — Z01818 Encounter for other preprocedural examination: Secondary | ICD-10-CM | POA: Diagnosis not present

## 2015-10-07 DIAGNOSIS — E669 Obesity, unspecified: Secondary | ICD-10-CM | POA: Diagnosis not present

## 2015-10-12 DIAGNOSIS — E669 Obesity, unspecified: Secondary | ICD-10-CM | POA: Diagnosis not present

## 2015-10-12 DIAGNOSIS — E1122 Type 2 diabetes mellitus with diabetic chronic kidney disease: Secondary | ICD-10-CM | POA: Diagnosis not present

## 2015-10-12 DIAGNOSIS — K219 Gastro-esophageal reflux disease without esophagitis: Secondary | ICD-10-CM | POA: Diagnosis not present

## 2015-10-12 DIAGNOSIS — E785 Hyperlipidemia, unspecified: Secondary | ICD-10-CM | POA: Diagnosis not present

## 2015-10-12 DIAGNOSIS — N186 End stage renal disease: Secondary | ICD-10-CM | POA: Diagnosis not present

## 2015-10-12 DIAGNOSIS — Z48816 Encounter for surgical aftercare following surgery on the genitourinary system: Secondary | ICD-10-CM | POA: Diagnosis not present

## 2015-10-12 DIAGNOSIS — Z9181 History of falling: Secondary | ICD-10-CM | POA: Diagnosis not present

## 2015-10-12 DIAGNOSIS — I509 Heart failure, unspecified: Secondary | ICD-10-CM | POA: Diagnosis not present

## 2015-10-12 DIAGNOSIS — Z905 Acquired absence of kidney: Secondary | ICD-10-CM | POA: Diagnosis not present

## 2015-10-12 DIAGNOSIS — I130 Hypertensive heart and chronic kidney disease with heart failure and stage 1 through stage 4 chronic kidney disease, or unspecified chronic kidney disease: Secondary | ICD-10-CM | POA: Diagnosis not present

## 2015-10-14 DIAGNOSIS — N2581 Secondary hyperparathyroidism of renal origin: Secondary | ICD-10-CM | POA: Diagnosis not present

## 2015-10-14 DIAGNOSIS — N186 End stage renal disease: Secondary | ICD-10-CM | POA: Diagnosis not present

## 2015-10-14 DIAGNOSIS — D631 Anemia in chronic kidney disease: Secondary | ICD-10-CM | POA: Diagnosis not present

## 2015-10-15 DIAGNOSIS — E785 Hyperlipidemia, unspecified: Secondary | ICD-10-CM | POA: Diagnosis not present

## 2015-10-15 DIAGNOSIS — I509 Heart failure, unspecified: Secondary | ICD-10-CM | POA: Diagnosis not present

## 2015-10-15 DIAGNOSIS — Z48816 Encounter for surgical aftercare following surgery on the genitourinary system: Secondary | ICD-10-CM | POA: Diagnosis not present

## 2015-10-15 DIAGNOSIS — E1122 Type 2 diabetes mellitus with diabetic chronic kidney disease: Secondary | ICD-10-CM | POA: Diagnosis not present

## 2015-10-15 DIAGNOSIS — I130 Hypertensive heart and chronic kidney disease with heart failure and stage 1 through stage 4 chronic kidney disease, or unspecified chronic kidney disease: Secondary | ICD-10-CM | POA: Diagnosis not present

## 2015-10-15 DIAGNOSIS — N186 End stage renal disease: Secondary | ICD-10-CM | POA: Diagnosis not present

## 2015-10-16 DIAGNOSIS — D631 Anemia in chronic kidney disease: Secondary | ICD-10-CM | POA: Diagnosis not present

## 2015-10-16 DIAGNOSIS — N2581 Secondary hyperparathyroidism of renal origin: Secondary | ICD-10-CM | POA: Diagnosis not present

## 2015-10-16 DIAGNOSIS — N186 End stage renal disease: Secondary | ICD-10-CM | POA: Diagnosis not present

## 2015-10-17 DIAGNOSIS — Z7982 Long term (current) use of aspirin: Secondary | ICD-10-CM | POA: Diagnosis not present

## 2015-10-17 DIAGNOSIS — Z992 Dependence on renal dialysis: Secondary | ICD-10-CM | POA: Diagnosis not present

## 2015-10-17 DIAGNOSIS — I509 Heart failure, unspecified: Secondary | ICD-10-CM | POA: Diagnosis not present

## 2015-10-17 DIAGNOSIS — T8612 Kidney transplant failure: Secondary | ICD-10-CM | POA: Diagnosis not present

## 2015-10-17 DIAGNOSIS — I959 Hypotension, unspecified: Secondary | ICD-10-CM | POA: Diagnosis not present

## 2015-10-17 DIAGNOSIS — N186 End stage renal disease: Secondary | ICD-10-CM | POA: Diagnosis not present

## 2015-10-17 DIAGNOSIS — E785 Hyperlipidemia, unspecified: Secondary | ICD-10-CM | POA: Diagnosis not present

## 2015-10-17 DIAGNOSIS — Z794 Long term (current) use of insulin: Secondary | ICD-10-CM | POA: Diagnosis not present

## 2015-10-17 DIAGNOSIS — Z01818 Encounter for other preprocedural examination: Secondary | ICD-10-CM | POA: Diagnosis not present

## 2015-10-17 DIAGNOSIS — Z4822 Encounter for aftercare following kidney transplant: Secondary | ICD-10-CM | POA: Diagnosis not present

## 2015-10-17 DIAGNOSIS — Z940 Kidney transplant status: Secondary | ICD-10-CM | POA: Diagnosis not present

## 2015-10-17 DIAGNOSIS — Z79899 Other long term (current) drug therapy: Secondary | ICD-10-CM | POA: Diagnosis not present

## 2015-10-17 DIAGNOSIS — I132 Hypertensive heart and chronic kidney disease with heart failure and with stage 5 chronic kidney disease, or end stage renal disease: Secondary | ICD-10-CM | POA: Diagnosis not present

## 2015-10-17 DIAGNOSIS — E1122 Type 2 diabetes mellitus with diabetic chronic kidney disease: Secondary | ICD-10-CM | POA: Diagnosis not present

## 2015-10-18 DIAGNOSIS — N186 End stage renal disease: Secondary | ICD-10-CM | POA: Diagnosis not present

## 2015-10-18 DIAGNOSIS — D631 Anemia in chronic kidney disease: Secondary | ICD-10-CM | POA: Diagnosis not present

## 2015-10-18 DIAGNOSIS — N2581 Secondary hyperparathyroidism of renal origin: Secondary | ICD-10-CM | POA: Diagnosis not present

## 2015-10-20 DIAGNOSIS — I10 Essential (primary) hypertension: Secondary | ICD-10-CM | POA: Diagnosis not present

## 2015-10-20 DIAGNOSIS — Z992 Dependence on renal dialysis: Secondary | ICD-10-CM | POA: Diagnosis not present

## 2015-10-20 DIAGNOSIS — G609 Hereditary and idiopathic neuropathy, unspecified: Secondary | ICD-10-CM | POA: Diagnosis not present

## 2015-10-20 DIAGNOSIS — E1165 Type 2 diabetes mellitus with hyperglycemia: Secondary | ICD-10-CM | POA: Diagnosis not present

## 2015-10-20 DIAGNOSIS — N186 End stage renal disease: Secondary | ICD-10-CM | POA: Diagnosis not present

## 2015-10-21 DIAGNOSIS — N2581 Secondary hyperparathyroidism of renal origin: Secondary | ICD-10-CM | POA: Diagnosis not present

## 2015-10-21 DIAGNOSIS — D631 Anemia in chronic kidney disease: Secondary | ICD-10-CM | POA: Diagnosis not present

## 2015-10-21 DIAGNOSIS — N186 End stage renal disease: Secondary | ICD-10-CM | POA: Diagnosis not present

## 2015-10-23 DIAGNOSIS — N186 End stage renal disease: Secondary | ICD-10-CM | POA: Diagnosis not present

## 2015-10-23 DIAGNOSIS — E1139 Type 2 diabetes mellitus with other diabetic ophthalmic complication: Secondary | ICD-10-CM | POA: Diagnosis not present

## 2015-10-23 DIAGNOSIS — D631 Anemia in chronic kidney disease: Secondary | ICD-10-CM | POA: Diagnosis not present

## 2015-10-23 DIAGNOSIS — N2581 Secondary hyperparathyroidism of renal origin: Secondary | ICD-10-CM | POA: Diagnosis not present

## 2015-10-24 DIAGNOSIS — T8612 Kidney transplant failure: Secondary | ICD-10-CM | POA: Diagnosis not present

## 2015-10-24 DIAGNOSIS — E1121 Type 2 diabetes mellitus with diabetic nephropathy: Secondary | ICD-10-CM | POA: Diagnosis not present

## 2015-10-24 DIAGNOSIS — I132 Hypertensive heart and chronic kidney disease with heart failure and with stage 5 chronic kidney disease, or end stage renal disease: Secondary | ICD-10-CM | POA: Diagnosis not present

## 2015-10-24 DIAGNOSIS — N186 End stage renal disease: Secondary | ICD-10-CM | POA: Diagnosis not present

## 2015-10-24 DIAGNOSIS — Z01818 Encounter for other preprocedural examination: Secondary | ICD-10-CM | POA: Diagnosis not present

## 2015-10-24 DIAGNOSIS — Z794 Long term (current) use of insulin: Secondary | ICD-10-CM | POA: Diagnosis not present

## 2015-10-24 DIAGNOSIS — I509 Heart failure, unspecified: Secondary | ICD-10-CM | POA: Diagnosis not present

## 2015-10-24 DIAGNOSIS — E669 Obesity, unspecified: Secondary | ICD-10-CM | POA: Diagnosis not present

## 2015-10-24 DIAGNOSIS — E785 Hyperlipidemia, unspecified: Secondary | ICD-10-CM | POA: Diagnosis not present

## 2015-10-24 DIAGNOSIS — Z7982 Long term (current) use of aspirin: Secondary | ICD-10-CM | POA: Diagnosis not present

## 2015-10-24 DIAGNOSIS — E118 Type 2 diabetes mellitus with unspecified complications: Secondary | ICD-10-CM | POA: Diagnosis not present

## 2015-10-24 DIAGNOSIS — Z992 Dependence on renal dialysis: Secondary | ICD-10-CM | POA: Diagnosis not present

## 2015-10-24 DIAGNOSIS — I959 Hypotension, unspecified: Secondary | ICD-10-CM | POA: Diagnosis not present

## 2015-10-24 DIAGNOSIS — T82598D Other mechanical complication of other cardiac and vascular devices and implants, subsequent encounter: Secondary | ICD-10-CM | POA: Diagnosis not present

## 2015-10-24 DIAGNOSIS — I5020 Unspecified systolic (congestive) heart failure: Secondary | ICD-10-CM | POA: Diagnosis not present

## 2015-10-25 DIAGNOSIS — N186 End stage renal disease: Secondary | ICD-10-CM | POA: Diagnosis not present

## 2015-10-25 DIAGNOSIS — N2581 Secondary hyperparathyroidism of renal origin: Secondary | ICD-10-CM | POA: Diagnosis not present

## 2015-10-25 DIAGNOSIS — D631 Anemia in chronic kidney disease: Secondary | ICD-10-CM | POA: Diagnosis not present

## 2015-10-27 DIAGNOSIS — E1122 Type 2 diabetes mellitus with diabetic chronic kidney disease: Secondary | ICD-10-CM | POA: Diagnosis not present

## 2015-10-27 DIAGNOSIS — I130 Hypertensive heart and chronic kidney disease with heart failure and stage 1 through stage 4 chronic kidney disease, or unspecified chronic kidney disease: Secondary | ICD-10-CM | POA: Diagnosis not present

## 2015-10-27 DIAGNOSIS — Z48816 Encounter for surgical aftercare following surgery on the genitourinary system: Secondary | ICD-10-CM | POA: Diagnosis not present

## 2015-10-27 DIAGNOSIS — E785 Hyperlipidemia, unspecified: Secondary | ICD-10-CM | POA: Diagnosis not present

## 2015-10-27 DIAGNOSIS — N186 End stage renal disease: Secondary | ICD-10-CM | POA: Diagnosis not present

## 2015-10-27 DIAGNOSIS — I509 Heart failure, unspecified: Secondary | ICD-10-CM | POA: Diagnosis not present

## 2015-10-28 DIAGNOSIS — N186 End stage renal disease: Secondary | ICD-10-CM | POA: Diagnosis not present

## 2015-10-28 DIAGNOSIS — D631 Anemia in chronic kidney disease: Secondary | ICD-10-CM | POA: Diagnosis not present

## 2015-10-28 DIAGNOSIS — N2581 Secondary hyperparathyroidism of renal origin: Secondary | ICD-10-CM | POA: Diagnosis not present

## 2015-10-30 DIAGNOSIS — D631 Anemia in chronic kidney disease: Secondary | ICD-10-CM | POA: Diagnosis not present

## 2015-10-30 DIAGNOSIS — N186 End stage renal disease: Secondary | ICD-10-CM | POA: Diagnosis not present

## 2015-10-30 DIAGNOSIS — N2581 Secondary hyperparathyroidism of renal origin: Secondary | ICD-10-CM | POA: Diagnosis not present

## 2015-10-31 DIAGNOSIS — Z794 Long term (current) use of insulin: Secondary | ICD-10-CM | POA: Diagnosis not present

## 2015-10-31 DIAGNOSIS — Z79899 Other long term (current) drug therapy: Secondary | ICD-10-CM | POA: Diagnosis not present

## 2015-10-31 DIAGNOSIS — E1122 Type 2 diabetes mellitus with diabetic chronic kidney disease: Secondary | ICD-10-CM | POA: Diagnosis not present

## 2015-10-31 DIAGNOSIS — E785 Hyperlipidemia, unspecified: Secondary | ICD-10-CM | POA: Diagnosis not present

## 2015-10-31 DIAGNOSIS — N186 End stage renal disease: Secondary | ICD-10-CM | POA: Diagnosis not present

## 2015-10-31 DIAGNOSIS — Z992 Dependence on renal dialysis: Secondary | ICD-10-CM | POA: Diagnosis not present

## 2015-10-31 DIAGNOSIS — E1121 Type 2 diabetes mellitus with diabetic nephropathy: Secondary | ICD-10-CM | POA: Diagnosis not present

## 2015-10-31 DIAGNOSIS — I509 Heart failure, unspecified: Secondary | ICD-10-CM | POA: Diagnosis not present

## 2015-10-31 DIAGNOSIS — E669 Obesity, unspecified: Secondary | ICD-10-CM | POA: Diagnosis not present

## 2015-10-31 DIAGNOSIS — Z6839 Body mass index (BMI) 39.0-39.9, adult: Secondary | ICD-10-CM | POA: Diagnosis not present

## 2015-10-31 DIAGNOSIS — Z7982 Long term (current) use of aspirin: Secondary | ICD-10-CM | POA: Diagnosis not present

## 2015-10-31 DIAGNOSIS — T8612 Kidney transplant failure: Secondary | ICD-10-CM | POA: Diagnosis not present

## 2015-10-31 DIAGNOSIS — I959 Hypotension, unspecified: Secondary | ICD-10-CM | POA: Diagnosis not present

## 2015-10-31 DIAGNOSIS — I9589 Other hypotension: Secondary | ICD-10-CM | POA: Diagnosis not present

## 2015-10-31 DIAGNOSIS — Z4822 Encounter for aftercare following kidney transplant: Secondary | ICD-10-CM | POA: Diagnosis not present

## 2015-10-31 DIAGNOSIS — I132 Hypertensive heart and chronic kidney disease with heart failure and with stage 5 chronic kidney disease, or end stage renal disease: Secondary | ICD-10-CM | POA: Diagnosis not present

## 2015-11-01 DIAGNOSIS — N2581 Secondary hyperparathyroidism of renal origin: Secondary | ICD-10-CM | POA: Diagnosis not present

## 2015-11-01 DIAGNOSIS — N186 End stage renal disease: Secondary | ICD-10-CM | POA: Diagnosis not present

## 2015-11-01 DIAGNOSIS — D631 Anemia in chronic kidney disease: Secondary | ICD-10-CM | POA: Diagnosis not present

## 2015-11-03 DIAGNOSIS — N186 End stage renal disease: Secondary | ICD-10-CM | POA: Diagnosis not present

## 2015-11-03 DIAGNOSIS — E1122 Type 2 diabetes mellitus with diabetic chronic kidney disease: Secondary | ICD-10-CM | POA: Diagnosis not present

## 2015-11-03 DIAGNOSIS — Z48816 Encounter for surgical aftercare following surgery on the genitourinary system: Secondary | ICD-10-CM | POA: Diagnosis not present

## 2015-11-03 DIAGNOSIS — I130 Hypertensive heart and chronic kidney disease with heart failure and stage 1 through stage 4 chronic kidney disease, or unspecified chronic kidney disease: Secondary | ICD-10-CM | POA: Diagnosis not present

## 2015-11-03 DIAGNOSIS — I509 Heart failure, unspecified: Secondary | ICD-10-CM | POA: Diagnosis not present

## 2015-11-03 DIAGNOSIS — E785 Hyperlipidemia, unspecified: Secondary | ICD-10-CM | POA: Diagnosis not present

## 2015-11-04 DIAGNOSIS — N186 End stage renal disease: Secondary | ICD-10-CM | POA: Diagnosis not present

## 2015-11-04 DIAGNOSIS — D631 Anemia in chronic kidney disease: Secondary | ICD-10-CM | POA: Diagnosis not present

## 2015-11-04 DIAGNOSIS — N2581 Secondary hyperparathyroidism of renal origin: Secondary | ICD-10-CM | POA: Diagnosis not present

## 2015-11-05 DIAGNOSIS — I509 Heart failure, unspecified: Secondary | ICD-10-CM | POA: Diagnosis not present

## 2015-11-05 DIAGNOSIS — I130 Hypertensive heart and chronic kidney disease with heart failure and stage 1 through stage 4 chronic kidney disease, or unspecified chronic kidney disease: Secondary | ICD-10-CM | POA: Diagnosis not present

## 2015-11-05 DIAGNOSIS — E1122 Type 2 diabetes mellitus with diabetic chronic kidney disease: Secondary | ICD-10-CM | POA: Diagnosis not present

## 2015-11-05 DIAGNOSIS — N186 End stage renal disease: Secondary | ICD-10-CM | POA: Diagnosis not present

## 2015-11-05 DIAGNOSIS — E785 Hyperlipidemia, unspecified: Secondary | ICD-10-CM | POA: Diagnosis not present

## 2015-11-05 DIAGNOSIS — Z48816 Encounter for surgical aftercare following surgery on the genitourinary system: Secondary | ICD-10-CM | POA: Diagnosis not present

## 2015-11-06 DIAGNOSIS — N2581 Secondary hyperparathyroidism of renal origin: Secondary | ICD-10-CM | POA: Diagnosis not present

## 2015-11-06 DIAGNOSIS — N186 End stage renal disease: Secondary | ICD-10-CM | POA: Diagnosis not present

## 2015-11-06 DIAGNOSIS — D631 Anemia in chronic kidney disease: Secondary | ICD-10-CM | POA: Diagnosis not present

## 2015-11-08 DIAGNOSIS — N2581 Secondary hyperparathyroidism of renal origin: Secondary | ICD-10-CM | POA: Diagnosis not present

## 2015-11-08 DIAGNOSIS — D631 Anemia in chronic kidney disease: Secondary | ICD-10-CM | POA: Diagnosis not present

## 2015-11-08 DIAGNOSIS — N186 End stage renal disease: Secondary | ICD-10-CM | POA: Diagnosis not present

## 2015-11-10 DIAGNOSIS — E1122 Type 2 diabetes mellitus with diabetic chronic kidney disease: Secondary | ICD-10-CM | POA: Diagnosis not present

## 2015-11-10 DIAGNOSIS — N186 End stage renal disease: Secondary | ICD-10-CM | POA: Diagnosis not present

## 2015-11-10 DIAGNOSIS — I509 Heart failure, unspecified: Secondary | ICD-10-CM | POA: Diagnosis not present

## 2015-11-10 DIAGNOSIS — I130 Hypertensive heart and chronic kidney disease with heart failure and stage 1 through stage 4 chronic kidney disease, or unspecified chronic kidney disease: Secondary | ICD-10-CM | POA: Diagnosis not present

## 2015-11-10 DIAGNOSIS — Z48816 Encounter for surgical aftercare following surgery on the genitourinary system: Secondary | ICD-10-CM | POA: Diagnosis not present

## 2015-11-10 DIAGNOSIS — E785 Hyperlipidemia, unspecified: Secondary | ICD-10-CM | POA: Diagnosis not present

## 2015-11-11 DIAGNOSIS — N186 End stage renal disease: Secondary | ICD-10-CM | POA: Diagnosis not present

## 2015-11-11 DIAGNOSIS — N2581 Secondary hyperparathyroidism of renal origin: Secondary | ICD-10-CM | POA: Diagnosis not present

## 2015-11-11 DIAGNOSIS — D631 Anemia in chronic kidney disease: Secondary | ICD-10-CM | POA: Diagnosis not present

## 2015-11-12 DIAGNOSIS — I509 Heart failure, unspecified: Secondary | ICD-10-CM | POA: Diagnosis not present

## 2015-11-12 DIAGNOSIS — Z48816 Encounter for surgical aftercare following surgery on the genitourinary system: Secondary | ICD-10-CM | POA: Diagnosis not present

## 2015-11-12 DIAGNOSIS — E785 Hyperlipidemia, unspecified: Secondary | ICD-10-CM | POA: Diagnosis not present

## 2015-11-12 DIAGNOSIS — I130 Hypertensive heart and chronic kidney disease with heart failure and stage 1 through stage 4 chronic kidney disease, or unspecified chronic kidney disease: Secondary | ICD-10-CM | POA: Diagnosis not present

## 2015-11-12 DIAGNOSIS — E1122 Type 2 diabetes mellitus with diabetic chronic kidney disease: Secondary | ICD-10-CM | POA: Diagnosis not present

## 2015-11-12 DIAGNOSIS — N186 End stage renal disease: Secondary | ICD-10-CM | POA: Diagnosis not present

## 2015-11-13 DIAGNOSIS — N2581 Secondary hyperparathyroidism of renal origin: Secondary | ICD-10-CM | POA: Diagnosis not present

## 2015-11-13 DIAGNOSIS — D631 Anemia in chronic kidney disease: Secondary | ICD-10-CM | POA: Diagnosis not present

## 2015-11-13 DIAGNOSIS — N186 End stage renal disease: Secondary | ICD-10-CM | POA: Diagnosis not present

## 2015-11-15 DIAGNOSIS — N2581 Secondary hyperparathyroidism of renal origin: Secondary | ICD-10-CM | POA: Diagnosis not present

## 2015-11-15 DIAGNOSIS — N186 End stage renal disease: Secondary | ICD-10-CM | POA: Diagnosis not present

## 2015-11-15 DIAGNOSIS — D631 Anemia in chronic kidney disease: Secondary | ICD-10-CM | POA: Diagnosis not present

## 2015-11-17 DIAGNOSIS — K210 Gastro-esophageal reflux disease with esophagitis: Secondary | ICD-10-CM | POA: Diagnosis not present

## 2015-11-17 DIAGNOSIS — M1990 Unspecified osteoarthritis, unspecified site: Secondary | ICD-10-CM | POA: Diagnosis not present

## 2015-11-17 DIAGNOSIS — G894 Chronic pain syndrome: Secondary | ICD-10-CM | POA: Diagnosis not present

## 2015-11-17 DIAGNOSIS — E7800 Pure hypercholesterolemia, unspecified: Secondary | ICD-10-CM | POA: Diagnosis not present

## 2015-11-17 DIAGNOSIS — E669 Obesity, unspecified: Secondary | ICD-10-CM | POA: Diagnosis not present

## 2015-11-17 DIAGNOSIS — Z79899 Other long term (current) drug therapy: Secondary | ICD-10-CM | POA: Diagnosis not present

## 2015-11-17 DIAGNOSIS — I5031 Acute diastolic (congestive) heart failure: Secondary | ICD-10-CM | POA: Diagnosis not present

## 2015-11-17 DIAGNOSIS — L988 Other specified disorders of the skin and subcutaneous tissue: Secondary | ICD-10-CM | POA: Diagnosis not present

## 2015-11-17 DIAGNOSIS — K259 Gastric ulcer, unspecified as acute or chronic, without hemorrhage or perforation: Secondary | ICD-10-CM | POA: Diagnosis not present

## 2015-11-17 DIAGNOSIS — H9190 Unspecified hearing loss, unspecified ear: Secondary | ICD-10-CM | POA: Diagnosis not present

## 2015-11-17 DIAGNOSIS — N186 End stage renal disease: Secondary | ICD-10-CM | POA: Diagnosis not present

## 2015-11-17 DIAGNOSIS — I5032 Chronic diastolic (congestive) heart failure: Secondary | ICD-10-CM | POA: Diagnosis not present

## 2015-11-17 DIAGNOSIS — E1121 Type 2 diabetes mellitus with diabetic nephropathy: Secondary | ICD-10-CM | POA: Diagnosis not present

## 2015-11-18 DIAGNOSIS — N186 End stage renal disease: Secondary | ICD-10-CM | POA: Diagnosis not present

## 2015-11-18 DIAGNOSIS — N2581 Secondary hyperparathyroidism of renal origin: Secondary | ICD-10-CM | POA: Diagnosis not present

## 2015-11-18 DIAGNOSIS — D631 Anemia in chronic kidney disease: Secondary | ICD-10-CM | POA: Diagnosis not present

## 2015-11-20 DIAGNOSIS — D631 Anemia in chronic kidney disease: Secondary | ICD-10-CM | POA: Diagnosis not present

## 2015-11-20 DIAGNOSIS — N2581 Secondary hyperparathyroidism of renal origin: Secondary | ICD-10-CM | POA: Diagnosis not present

## 2015-11-20 DIAGNOSIS — Z992 Dependence on renal dialysis: Secondary | ICD-10-CM | POA: Diagnosis not present

## 2015-11-20 DIAGNOSIS — N186 End stage renal disease: Secondary | ICD-10-CM | POA: Diagnosis not present

## 2015-11-22 DIAGNOSIS — N2581 Secondary hyperparathyroidism of renal origin: Secondary | ICD-10-CM | POA: Diagnosis not present

## 2015-11-22 DIAGNOSIS — D631 Anemia in chronic kidney disease: Secondary | ICD-10-CM | POA: Diagnosis not present

## 2015-11-22 DIAGNOSIS — N186 End stage renal disease: Secondary | ICD-10-CM | POA: Diagnosis not present

## 2015-11-25 DIAGNOSIS — N2581 Secondary hyperparathyroidism of renal origin: Secondary | ICD-10-CM | POA: Diagnosis not present

## 2015-11-25 DIAGNOSIS — N186 End stage renal disease: Secondary | ICD-10-CM | POA: Diagnosis not present

## 2015-11-25 DIAGNOSIS — D631 Anemia in chronic kidney disease: Secondary | ICD-10-CM | POA: Diagnosis not present

## 2015-11-27 DIAGNOSIS — E1139 Type 2 diabetes mellitus with other diabetic ophthalmic complication: Secondary | ICD-10-CM | POA: Diagnosis not present

## 2015-11-27 DIAGNOSIS — N186 End stage renal disease: Secondary | ICD-10-CM | POA: Diagnosis not present

## 2015-11-27 DIAGNOSIS — N2581 Secondary hyperparathyroidism of renal origin: Secondary | ICD-10-CM | POA: Diagnosis not present

## 2015-11-27 DIAGNOSIS — D631 Anemia in chronic kidney disease: Secondary | ICD-10-CM | POA: Diagnosis not present

## 2015-11-29 DIAGNOSIS — N186 End stage renal disease: Secondary | ICD-10-CM | POA: Diagnosis not present

## 2015-11-29 DIAGNOSIS — D631 Anemia in chronic kidney disease: Secondary | ICD-10-CM | POA: Diagnosis not present

## 2015-11-29 DIAGNOSIS — N2581 Secondary hyperparathyroidism of renal origin: Secondary | ICD-10-CM | POA: Diagnosis not present

## 2015-12-01 DIAGNOSIS — Z992 Dependence on renal dialysis: Secondary | ICD-10-CM | POA: Diagnosis not present

## 2015-12-01 DIAGNOSIS — E1122 Type 2 diabetes mellitus with diabetic chronic kidney disease: Secondary | ICD-10-CM | POA: Diagnosis not present

## 2015-12-01 DIAGNOSIS — I5020 Unspecified systolic (congestive) heart failure: Secondary | ICD-10-CM | POA: Diagnosis not present

## 2015-12-01 DIAGNOSIS — Z6839 Body mass index (BMI) 39.0-39.9, adult: Secondary | ICD-10-CM | POA: Diagnosis not present

## 2015-12-01 DIAGNOSIS — E669 Obesity, unspecified: Secondary | ICD-10-CM | POA: Diagnosis not present

## 2015-12-01 DIAGNOSIS — E785 Hyperlipidemia, unspecified: Secondary | ICD-10-CM | POA: Diagnosis not present

## 2015-12-01 DIAGNOSIS — N186 End stage renal disease: Secondary | ICD-10-CM | POA: Diagnosis not present

## 2015-12-01 DIAGNOSIS — I509 Heart failure, unspecified: Secondary | ICD-10-CM | POA: Diagnosis not present

## 2015-12-01 DIAGNOSIS — I132 Hypertensive heart and chronic kidney disease with heart failure and with stage 5 chronic kidney disease, or end stage renal disease: Secondary | ICD-10-CM | POA: Diagnosis not present

## 2015-12-02 DIAGNOSIS — D631 Anemia in chronic kidney disease: Secondary | ICD-10-CM | POA: Diagnosis not present

## 2015-12-02 DIAGNOSIS — N186 End stage renal disease: Secondary | ICD-10-CM | POA: Diagnosis not present

## 2015-12-02 DIAGNOSIS — N2581 Secondary hyperparathyroidism of renal origin: Secondary | ICD-10-CM | POA: Diagnosis not present

## 2015-12-04 DIAGNOSIS — N186 End stage renal disease: Secondary | ICD-10-CM | POA: Diagnosis not present

## 2015-12-04 DIAGNOSIS — D631 Anemia in chronic kidney disease: Secondary | ICD-10-CM | POA: Diagnosis not present

## 2015-12-04 DIAGNOSIS — N2581 Secondary hyperparathyroidism of renal origin: Secondary | ICD-10-CM | POA: Diagnosis not present

## 2015-12-06 DIAGNOSIS — D631 Anemia in chronic kidney disease: Secondary | ICD-10-CM | POA: Diagnosis not present

## 2015-12-06 DIAGNOSIS — N2581 Secondary hyperparathyroidism of renal origin: Secondary | ICD-10-CM | POA: Diagnosis not present

## 2015-12-06 DIAGNOSIS — N186 End stage renal disease: Secondary | ICD-10-CM | POA: Diagnosis not present

## 2015-12-09 DIAGNOSIS — D631 Anemia in chronic kidney disease: Secondary | ICD-10-CM | POA: Diagnosis not present

## 2015-12-09 DIAGNOSIS — N186 End stage renal disease: Secondary | ICD-10-CM | POA: Diagnosis not present

## 2015-12-09 DIAGNOSIS — N2581 Secondary hyperparathyroidism of renal origin: Secondary | ICD-10-CM | POA: Diagnosis not present

## 2015-12-10 DIAGNOSIS — Z940 Kidney transplant status: Secondary | ICD-10-CM | POA: Diagnosis not present

## 2015-12-10 DIAGNOSIS — Z4822 Encounter for aftercare following kidney transplant: Secondary | ICD-10-CM | POA: Diagnosis not present

## 2015-12-11 DIAGNOSIS — N2581 Secondary hyperparathyroidism of renal origin: Secondary | ICD-10-CM | POA: Diagnosis not present

## 2015-12-11 DIAGNOSIS — D631 Anemia in chronic kidney disease: Secondary | ICD-10-CM | POA: Diagnosis not present

## 2015-12-11 DIAGNOSIS — N186 End stage renal disease: Secondary | ICD-10-CM | POA: Diagnosis not present

## 2015-12-13 DIAGNOSIS — D631 Anemia in chronic kidney disease: Secondary | ICD-10-CM | POA: Diagnosis not present

## 2015-12-13 DIAGNOSIS — N2581 Secondary hyperparathyroidism of renal origin: Secondary | ICD-10-CM | POA: Diagnosis not present

## 2015-12-13 DIAGNOSIS — N186 End stage renal disease: Secondary | ICD-10-CM | POA: Diagnosis not present

## 2015-12-16 DIAGNOSIS — N2581 Secondary hyperparathyroidism of renal origin: Secondary | ICD-10-CM | POA: Diagnosis not present

## 2015-12-16 DIAGNOSIS — D631 Anemia in chronic kidney disease: Secondary | ICD-10-CM | POA: Diagnosis not present

## 2015-12-16 DIAGNOSIS — N186 End stage renal disease: Secondary | ICD-10-CM | POA: Diagnosis not present

## 2015-12-18 DIAGNOSIS — N186 End stage renal disease: Secondary | ICD-10-CM | POA: Diagnosis not present

## 2015-12-18 DIAGNOSIS — N2581 Secondary hyperparathyroidism of renal origin: Secondary | ICD-10-CM | POA: Diagnosis not present

## 2015-12-18 DIAGNOSIS — D631 Anemia in chronic kidney disease: Secondary | ICD-10-CM | POA: Diagnosis not present

## 2015-12-20 DIAGNOSIS — Z992 Dependence on renal dialysis: Secondary | ICD-10-CM | POA: Diagnosis not present

## 2015-12-20 DIAGNOSIS — N186 End stage renal disease: Secondary | ICD-10-CM | POA: Diagnosis not present

## 2015-12-20 DIAGNOSIS — D631 Anemia in chronic kidney disease: Secondary | ICD-10-CM | POA: Diagnosis not present

## 2015-12-20 DIAGNOSIS — N2581 Secondary hyperparathyroidism of renal origin: Secondary | ICD-10-CM | POA: Diagnosis not present

## 2015-12-23 DIAGNOSIS — D631 Anemia in chronic kidney disease: Secondary | ICD-10-CM | POA: Diagnosis not present

## 2015-12-23 DIAGNOSIS — N2581 Secondary hyperparathyroidism of renal origin: Secondary | ICD-10-CM | POA: Diagnosis not present

## 2015-12-23 DIAGNOSIS — N186 End stage renal disease: Secondary | ICD-10-CM | POA: Diagnosis not present

## 2015-12-25 DIAGNOSIS — M109 Gout, unspecified: Secondary | ICD-10-CM | POA: Diagnosis not present

## 2015-12-25 DIAGNOSIS — E1139 Type 2 diabetes mellitus with other diabetic ophthalmic complication: Secondary | ICD-10-CM | POA: Diagnosis not present

## 2015-12-25 DIAGNOSIS — N2581 Secondary hyperparathyroidism of renal origin: Secondary | ICD-10-CM | POA: Diagnosis not present

## 2015-12-25 DIAGNOSIS — D631 Anemia in chronic kidney disease: Secondary | ICD-10-CM | POA: Diagnosis not present

## 2015-12-25 DIAGNOSIS — N186 End stage renal disease: Secondary | ICD-10-CM | POA: Diagnosis not present

## 2015-12-27 DIAGNOSIS — N2581 Secondary hyperparathyroidism of renal origin: Secondary | ICD-10-CM | POA: Diagnosis not present

## 2015-12-27 DIAGNOSIS — N186 End stage renal disease: Secondary | ICD-10-CM | POA: Diagnosis not present

## 2015-12-27 DIAGNOSIS — D631 Anemia in chronic kidney disease: Secondary | ICD-10-CM | POA: Diagnosis not present

## 2015-12-30 DIAGNOSIS — N2581 Secondary hyperparathyroidism of renal origin: Secondary | ICD-10-CM | POA: Diagnosis not present

## 2015-12-30 DIAGNOSIS — D631 Anemia in chronic kidney disease: Secondary | ICD-10-CM | POA: Diagnosis not present

## 2015-12-30 DIAGNOSIS — N186 End stage renal disease: Secondary | ICD-10-CM | POA: Diagnosis not present

## 2016-01-01 DIAGNOSIS — N2581 Secondary hyperparathyroidism of renal origin: Secondary | ICD-10-CM | POA: Diagnosis not present

## 2016-01-01 DIAGNOSIS — D631 Anemia in chronic kidney disease: Secondary | ICD-10-CM | POA: Diagnosis not present

## 2016-01-01 DIAGNOSIS — N186 End stage renal disease: Secondary | ICD-10-CM | POA: Diagnosis not present

## 2016-01-03 DIAGNOSIS — N2581 Secondary hyperparathyroidism of renal origin: Secondary | ICD-10-CM | POA: Diagnosis not present

## 2016-01-03 DIAGNOSIS — D631 Anemia in chronic kidney disease: Secondary | ICD-10-CM | POA: Diagnosis not present

## 2016-01-03 DIAGNOSIS — N186 End stage renal disease: Secondary | ICD-10-CM | POA: Diagnosis not present

## 2016-01-05 DIAGNOSIS — Z23 Encounter for immunization: Secondary | ICD-10-CM | POA: Diagnosis not present

## 2016-01-05 DIAGNOSIS — I5032 Chronic diastolic (congestive) heart failure: Secondary | ICD-10-CM | POA: Diagnosis not present

## 2016-01-05 DIAGNOSIS — K210 Gastro-esophageal reflux disease with esophagitis: Secondary | ICD-10-CM | POA: Diagnosis not present

## 2016-01-05 DIAGNOSIS — E7800 Pure hypercholesterolemia, unspecified: Secondary | ICD-10-CM | POA: Diagnosis not present

## 2016-01-05 DIAGNOSIS — G894 Chronic pain syndrome: Secondary | ICD-10-CM | POA: Diagnosis not present

## 2016-01-05 DIAGNOSIS — Z79899 Other long term (current) drug therapy: Secondary | ICD-10-CM | POA: Diagnosis not present

## 2016-01-05 DIAGNOSIS — H9190 Unspecified hearing loss, unspecified ear: Secondary | ICD-10-CM | POA: Diagnosis not present

## 2016-01-05 DIAGNOSIS — E1121 Type 2 diabetes mellitus with diabetic nephropathy: Secondary | ICD-10-CM | POA: Diagnosis not present

## 2016-01-05 DIAGNOSIS — M1990 Unspecified osteoarthritis, unspecified site: Secondary | ICD-10-CM | POA: Diagnosis not present

## 2016-01-05 DIAGNOSIS — E669 Obesity, unspecified: Secondary | ICD-10-CM | POA: Diagnosis not present

## 2016-01-05 DIAGNOSIS — N186 End stage renal disease: Secondary | ICD-10-CM | POA: Diagnosis not present

## 2016-01-05 DIAGNOSIS — L02211 Cutaneous abscess of abdominal wall: Secondary | ICD-10-CM | POA: Diagnosis not present

## 2016-01-06 DIAGNOSIS — D631 Anemia in chronic kidney disease: Secondary | ICD-10-CM | POA: Diagnosis not present

## 2016-01-06 DIAGNOSIS — N186 End stage renal disease: Secondary | ICD-10-CM | POA: Diagnosis not present

## 2016-01-06 DIAGNOSIS — E119 Type 2 diabetes mellitus without complications: Secondary | ICD-10-CM | POA: Diagnosis not present

## 2016-01-06 DIAGNOSIS — N2581 Secondary hyperparathyroidism of renal origin: Secondary | ICD-10-CM | POA: Diagnosis not present

## 2016-01-08 DIAGNOSIS — N186 End stage renal disease: Secondary | ICD-10-CM | POA: Diagnosis not present

## 2016-01-08 DIAGNOSIS — N2581 Secondary hyperparathyroidism of renal origin: Secondary | ICD-10-CM | POA: Diagnosis not present

## 2016-01-08 DIAGNOSIS — D631 Anemia in chronic kidney disease: Secondary | ICD-10-CM | POA: Diagnosis not present

## 2016-01-10 DIAGNOSIS — N2581 Secondary hyperparathyroidism of renal origin: Secondary | ICD-10-CM | POA: Diagnosis not present

## 2016-01-10 DIAGNOSIS — D631 Anemia in chronic kidney disease: Secondary | ICD-10-CM | POA: Diagnosis not present

## 2016-01-10 DIAGNOSIS — N186 End stage renal disease: Secondary | ICD-10-CM | POA: Diagnosis not present

## 2016-01-13 DIAGNOSIS — D631 Anemia in chronic kidney disease: Secondary | ICD-10-CM | POA: Diagnosis not present

## 2016-01-13 DIAGNOSIS — N2581 Secondary hyperparathyroidism of renal origin: Secondary | ICD-10-CM | POA: Diagnosis not present

## 2016-01-13 DIAGNOSIS — N186 End stage renal disease: Secondary | ICD-10-CM | POA: Diagnosis not present

## 2016-01-15 DIAGNOSIS — D631 Anemia in chronic kidney disease: Secondary | ICD-10-CM | POA: Diagnosis not present

## 2016-01-15 DIAGNOSIS — N2581 Secondary hyperparathyroidism of renal origin: Secondary | ICD-10-CM | POA: Diagnosis not present

## 2016-01-15 DIAGNOSIS — N186 End stage renal disease: Secondary | ICD-10-CM | POA: Diagnosis not present

## 2016-01-17 DIAGNOSIS — N186 End stage renal disease: Secondary | ICD-10-CM | POA: Diagnosis not present

## 2016-01-17 DIAGNOSIS — D631 Anemia in chronic kidney disease: Secondary | ICD-10-CM | POA: Diagnosis not present

## 2016-01-17 DIAGNOSIS — N2581 Secondary hyperparathyroidism of renal origin: Secondary | ICD-10-CM | POA: Diagnosis not present

## 2016-01-20 DIAGNOSIS — N186 End stage renal disease: Secondary | ICD-10-CM | POA: Diagnosis not present

## 2016-01-20 DIAGNOSIS — N2581 Secondary hyperparathyroidism of renal origin: Secondary | ICD-10-CM | POA: Diagnosis not present

## 2016-01-20 DIAGNOSIS — D631 Anemia in chronic kidney disease: Secondary | ICD-10-CM | POA: Diagnosis not present

## 2016-01-20 DIAGNOSIS — Z992 Dependence on renal dialysis: Secondary | ICD-10-CM | POA: Diagnosis not present

## 2016-01-21 DIAGNOSIS — H16223 Keratoconjunctivitis sicca, not specified as Sjogren's, bilateral: Secondary | ICD-10-CM | POA: Diagnosis not present

## 2016-01-21 DIAGNOSIS — H401111 Primary open-angle glaucoma, right eye, mild stage: Secondary | ICD-10-CM | POA: Diagnosis not present

## 2016-01-22 DIAGNOSIS — N2581 Secondary hyperparathyroidism of renal origin: Secondary | ICD-10-CM | POA: Diagnosis not present

## 2016-01-22 DIAGNOSIS — E1139 Type 2 diabetes mellitus with other diabetic ophthalmic complication: Secondary | ICD-10-CM | POA: Diagnosis not present

## 2016-01-22 DIAGNOSIS — N186 End stage renal disease: Secondary | ICD-10-CM | POA: Diagnosis not present

## 2016-01-22 DIAGNOSIS — D631 Anemia in chronic kidney disease: Secondary | ICD-10-CM | POA: Diagnosis not present

## 2016-01-24 DIAGNOSIS — N2581 Secondary hyperparathyroidism of renal origin: Secondary | ICD-10-CM | POA: Diagnosis not present

## 2016-01-24 DIAGNOSIS — D631 Anemia in chronic kidney disease: Secondary | ICD-10-CM | POA: Diagnosis not present

## 2016-01-24 DIAGNOSIS — N186 End stage renal disease: Secondary | ICD-10-CM | POA: Diagnosis not present

## 2016-01-27 DIAGNOSIS — N186 End stage renal disease: Secondary | ICD-10-CM | POA: Diagnosis not present

## 2016-01-27 DIAGNOSIS — D631 Anemia in chronic kidney disease: Secondary | ICD-10-CM | POA: Diagnosis not present

## 2016-01-27 DIAGNOSIS — N2581 Secondary hyperparathyroidism of renal origin: Secondary | ICD-10-CM | POA: Diagnosis not present

## 2016-01-29 DIAGNOSIS — N2581 Secondary hyperparathyroidism of renal origin: Secondary | ICD-10-CM | POA: Diagnosis not present

## 2016-01-29 DIAGNOSIS — D631 Anemia in chronic kidney disease: Secondary | ICD-10-CM | POA: Diagnosis not present

## 2016-01-29 DIAGNOSIS — N186 End stage renal disease: Secondary | ICD-10-CM | POA: Diagnosis not present

## 2016-01-31 DIAGNOSIS — N186 End stage renal disease: Secondary | ICD-10-CM | POA: Diagnosis not present

## 2016-01-31 DIAGNOSIS — D631 Anemia in chronic kidney disease: Secondary | ICD-10-CM | POA: Diagnosis not present

## 2016-01-31 DIAGNOSIS — N2581 Secondary hyperparathyroidism of renal origin: Secondary | ICD-10-CM | POA: Diagnosis not present

## 2016-02-03 DIAGNOSIS — N2581 Secondary hyperparathyroidism of renal origin: Secondary | ICD-10-CM | POA: Diagnosis not present

## 2016-02-03 DIAGNOSIS — N186 End stage renal disease: Secondary | ICD-10-CM | POA: Diagnosis not present

## 2016-02-03 DIAGNOSIS — D631 Anemia in chronic kidney disease: Secondary | ICD-10-CM | POA: Diagnosis not present

## 2016-02-05 DIAGNOSIS — D631 Anemia in chronic kidney disease: Secondary | ICD-10-CM | POA: Diagnosis not present

## 2016-02-05 DIAGNOSIS — N186 End stage renal disease: Secondary | ICD-10-CM | POA: Diagnosis not present

## 2016-02-05 DIAGNOSIS — N2581 Secondary hyperparathyroidism of renal origin: Secondary | ICD-10-CM | POA: Diagnosis not present

## 2016-02-07 DIAGNOSIS — N2581 Secondary hyperparathyroidism of renal origin: Secondary | ICD-10-CM | POA: Diagnosis not present

## 2016-02-07 DIAGNOSIS — D631 Anemia in chronic kidney disease: Secondary | ICD-10-CM | POA: Diagnosis not present

## 2016-02-07 DIAGNOSIS — N186 End stage renal disease: Secondary | ICD-10-CM | POA: Diagnosis not present

## 2016-02-09 DIAGNOSIS — N2581 Secondary hyperparathyroidism of renal origin: Secondary | ICD-10-CM | POA: Diagnosis not present

## 2016-02-09 DIAGNOSIS — N186 End stage renal disease: Secondary | ICD-10-CM | POA: Diagnosis not present

## 2016-02-09 DIAGNOSIS — D631 Anemia in chronic kidney disease: Secondary | ICD-10-CM | POA: Diagnosis not present

## 2016-02-11 ENCOUNTER — Ambulatory Visit: Payer: Medicare Other | Admitting: Interventional Cardiology

## 2016-02-11 DIAGNOSIS — N2581 Secondary hyperparathyroidism of renal origin: Secondary | ICD-10-CM | POA: Diagnosis not present

## 2016-02-11 DIAGNOSIS — N186 End stage renal disease: Secondary | ICD-10-CM | POA: Diagnosis not present

## 2016-02-11 DIAGNOSIS — D631 Anemia in chronic kidney disease: Secondary | ICD-10-CM | POA: Diagnosis not present

## 2016-02-14 DIAGNOSIS — N2581 Secondary hyperparathyroidism of renal origin: Secondary | ICD-10-CM | POA: Diagnosis not present

## 2016-02-14 DIAGNOSIS — D631 Anemia in chronic kidney disease: Secondary | ICD-10-CM | POA: Diagnosis not present

## 2016-02-14 DIAGNOSIS — N186 End stage renal disease: Secondary | ICD-10-CM | POA: Diagnosis not present

## 2016-02-17 DIAGNOSIS — N2581 Secondary hyperparathyroidism of renal origin: Secondary | ICD-10-CM | POA: Diagnosis not present

## 2016-02-17 DIAGNOSIS — N186 End stage renal disease: Secondary | ICD-10-CM | POA: Diagnosis not present

## 2016-02-17 DIAGNOSIS — D631 Anemia in chronic kidney disease: Secondary | ICD-10-CM | POA: Diagnosis not present

## 2016-02-19 DIAGNOSIS — N186 End stage renal disease: Secondary | ICD-10-CM | POA: Diagnosis not present

## 2016-02-19 DIAGNOSIS — N2581 Secondary hyperparathyroidism of renal origin: Secondary | ICD-10-CM | POA: Diagnosis not present

## 2016-02-19 DIAGNOSIS — Z992 Dependence on renal dialysis: Secondary | ICD-10-CM | POA: Diagnosis not present

## 2016-02-19 DIAGNOSIS — D631 Anemia in chronic kidney disease: Secondary | ICD-10-CM | POA: Diagnosis not present

## 2016-02-21 DIAGNOSIS — Z23 Encounter for immunization: Secondary | ICD-10-CM | POA: Diagnosis not present

## 2016-02-21 DIAGNOSIS — N2581 Secondary hyperparathyroidism of renal origin: Secondary | ICD-10-CM | POA: Diagnosis not present

## 2016-02-21 DIAGNOSIS — D631 Anemia in chronic kidney disease: Secondary | ICD-10-CM | POA: Diagnosis not present

## 2016-02-21 DIAGNOSIS — N186 End stage renal disease: Secondary | ICD-10-CM | POA: Diagnosis not present

## 2016-02-24 DIAGNOSIS — D631 Anemia in chronic kidney disease: Secondary | ICD-10-CM | POA: Diagnosis not present

## 2016-02-24 DIAGNOSIS — N2581 Secondary hyperparathyroidism of renal origin: Secondary | ICD-10-CM | POA: Diagnosis not present

## 2016-02-24 DIAGNOSIS — Z23 Encounter for immunization: Secondary | ICD-10-CM | POA: Diagnosis not present

## 2016-02-24 DIAGNOSIS — N186 End stage renal disease: Secondary | ICD-10-CM | POA: Diagnosis not present

## 2016-02-26 DIAGNOSIS — Z125 Encounter for screening for malignant neoplasm of prostate: Secondary | ICD-10-CM | POA: Diagnosis not present

## 2016-02-26 DIAGNOSIS — Z23 Encounter for immunization: Secondary | ICD-10-CM | POA: Diagnosis not present

## 2016-02-26 DIAGNOSIS — D631 Anemia in chronic kidney disease: Secondary | ICD-10-CM | POA: Diagnosis not present

## 2016-02-26 DIAGNOSIS — N186 End stage renal disease: Secondary | ICD-10-CM | POA: Diagnosis not present

## 2016-02-26 DIAGNOSIS — N2581 Secondary hyperparathyroidism of renal origin: Secondary | ICD-10-CM | POA: Diagnosis not present

## 2016-02-26 DIAGNOSIS — E1139 Type 2 diabetes mellitus with other diabetic ophthalmic complication: Secondary | ICD-10-CM | POA: Diagnosis not present

## 2016-02-28 DIAGNOSIS — D631 Anemia in chronic kidney disease: Secondary | ICD-10-CM | POA: Diagnosis not present

## 2016-02-28 DIAGNOSIS — N186 End stage renal disease: Secondary | ICD-10-CM | POA: Diagnosis not present

## 2016-02-28 DIAGNOSIS — Z23 Encounter for immunization: Secondary | ICD-10-CM | POA: Diagnosis not present

## 2016-02-28 DIAGNOSIS — N2581 Secondary hyperparathyroidism of renal origin: Secondary | ICD-10-CM | POA: Diagnosis not present

## 2016-03-02 DIAGNOSIS — N2581 Secondary hyperparathyroidism of renal origin: Secondary | ICD-10-CM | POA: Diagnosis not present

## 2016-03-02 DIAGNOSIS — Z23 Encounter for immunization: Secondary | ICD-10-CM | POA: Diagnosis not present

## 2016-03-02 DIAGNOSIS — D631 Anemia in chronic kidney disease: Secondary | ICD-10-CM | POA: Diagnosis not present

## 2016-03-02 DIAGNOSIS — N186 End stage renal disease: Secondary | ICD-10-CM | POA: Diagnosis not present

## 2016-03-04 DIAGNOSIS — N186 End stage renal disease: Secondary | ICD-10-CM | POA: Diagnosis not present

## 2016-03-04 DIAGNOSIS — D631 Anemia in chronic kidney disease: Secondary | ICD-10-CM | POA: Diagnosis not present

## 2016-03-04 DIAGNOSIS — N2581 Secondary hyperparathyroidism of renal origin: Secondary | ICD-10-CM | POA: Diagnosis not present

## 2016-03-04 DIAGNOSIS — Z23 Encounter for immunization: Secondary | ICD-10-CM | POA: Diagnosis not present

## 2016-03-06 DIAGNOSIS — D631 Anemia in chronic kidney disease: Secondary | ICD-10-CM | POA: Diagnosis not present

## 2016-03-06 DIAGNOSIS — Z23 Encounter for immunization: Secondary | ICD-10-CM | POA: Diagnosis not present

## 2016-03-06 DIAGNOSIS — N186 End stage renal disease: Secondary | ICD-10-CM | POA: Diagnosis not present

## 2016-03-06 DIAGNOSIS — N2581 Secondary hyperparathyroidism of renal origin: Secondary | ICD-10-CM | POA: Diagnosis not present

## 2016-03-08 ENCOUNTER — Ambulatory Visit (INDEPENDENT_AMBULATORY_CARE_PROVIDER_SITE_OTHER): Payer: Medicare Other | Admitting: Interventional Cardiology

## 2016-03-08 ENCOUNTER — Encounter: Payer: Self-pay | Admitting: Interventional Cardiology

## 2016-03-08 VITALS — BP 98/70 | HR 98 | Ht 65.0 in | Wt 245.4 lb

## 2016-03-08 DIAGNOSIS — I428 Other cardiomyopathies: Secondary | ICD-10-CM | POA: Diagnosis not present

## 2016-03-08 DIAGNOSIS — N186 End stage renal disease: Secondary | ICD-10-CM

## 2016-03-08 DIAGNOSIS — I5022 Chronic systolic (congestive) heart failure: Secondary | ICD-10-CM

## 2016-03-08 DIAGNOSIS — E1122 Type 2 diabetes mellitus with diabetic chronic kidney disease: Secondary | ICD-10-CM

## 2016-03-08 DIAGNOSIS — I10 Essential (primary) hypertension: Secondary | ICD-10-CM

## 2016-03-08 DIAGNOSIS — Z992 Dependence on renal dialysis: Secondary | ICD-10-CM

## 2016-03-08 NOTE — Patient Instructions (Signed)
Medication Instructions:  None  Labwork: None  Testing/Procedures: None  Follow-Up: Your physician wants you to follow-up in: 9-12 months with Dr. Smith.  You will receive a reminder letter in the mail two months in advance. If you don't receive a letter, please call our office to schedule the follow-up appointment.   Any Other Special Instructions Will Be Listed Below (If Applicable).     If you need a refill on your cardiac medications before your next appointment, please call your pharmacy.   

## 2016-03-08 NOTE — Progress Notes (Signed)
Cardiology Office Note    Date:  03/08/2016   ID:  [name], DOB 10/21/1965, MRN [id number]  PCP:  Leola Brazil, MD  Cardiologist: Sinclair Grooms, MD   Chief Complaint  Patient presents with  . Congestive Heart Failure    History of Present Illness:  [name] [name] is a 50 y.o. male for non-ischemic cardiomyopathy, ESRD, DM II, and obesity. He has 2 sets of specialist. He has a nephrologist, Dr. Brayton El in [facility]. He is also is being seen by nephrology at [facility] and is on the list for transplantation.[name] He also sees a cardiologist at [facility], Dr. [name].  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 He is back on dialysis. The kidney was not explanted. He is undergoing atrophy process but says that he is still on the transplant list. He denies chest pain and dyspnea. He has frequent hypotension on dialysis. He continues to be a difficult management.   Past Medical History:  Diagnosis Date  . Anal infection    posterior anal canal  . Anemia, chronic renal failure   . Atrophic kidney    BILATERAL  . DM type 2 causing ESRD [facility])    Nephrologist-- dr [name] [facility])--  on hemodialysis since June 2012 at  [name]  TTS  . Hemodialysis patient [facility])    at [place] on Tues/ Thur/Sat/schedule  . Hemorrhoids   . Hepatitis B antibody positive   . History of pleural effusion    bilateral  . Hyperparathyroidism, secondary renal ([place])   . Hypertension   . Ischemic cardiomyopathy    per echo 07-01-2014  ef 45%  . LAFB (left anterior fascicular block)   . Peripheral neuropathy ([place])   . Systolic and diastolic CHF, chronic ([place])    CARDIOLOGIST-  DR Daneen Schick ([place])  AND DR [name] ([name])    Past Surgical History:  Procedure Laterality Date  . APPENDECTOMY  09-12-2004   laparotomy w/ drainage  peritinitis  . AV FISTULA PLACEMENT  02-27-2010   right forearm (RADIOCEPHALIC)  . AV FISTULA REPAIR  10-30-2010  . CARDIOVASCULAR STRESS TEST  10-29-2011   dr Daneen Schick   Low risk scan;  mild perfusion defect seen in the basal inferoseptal, basal inferior and mid inferior regions consistent with an infarct/scar and/or overlying attenuation/  mild to moderate global LVSF,  ef 40-45%  . DOBUTAMINE STRESS ECHO  07-23-2012   [name]   abnormal ;  at rest estimated lvef 25-30% and global severe LV hypokinesis ;  no cp during stress and achieved 85% maxium predicted heart rate;  negative stress ECG for inducible ischemia;  estimated lvef with stress 35-40%;  augmentation of wall segments consistant with cardiomyopathy and differential fibrosis  . FISTULOTOMY N/A 03/26/2015   Procedure: FISTULOTOMY;  Surgeon: [name], MD;  Location: [facility];  Service: General;  Laterality: N/A;  . INCISION AND DRAINAGE ABSCESS N/A 03/26/2015   Procedure: ANAL INCISION AND DRAINAGE;  Surgeon: [name], MD;  Location: [place];  Service: General;  Laterality: N/A;  . RETINAL DETACHMENT SURGERY Left 2011   incomplete repair/ needs eye drops to keep pressure down  . TRANSTHORACIC ECHOCARDIOGRAM  07-01-2014    done at [facility]   grade 1 diastolic dysfunction,  ef 45%/  trace TR and PR    Current Medications: Outpatient  Medications Prior to Visit  Medication Sig Dispense Refill  . aspirin 81 MG tablet Take 81 mg by mouth daily.    . Calcium Acetate, Phos Binder, 667 MG CAPS Take 2 capsules by mouth 3 (three) times daily with meals.     . cinacalcet (SENSIPAR) 60 MG tablet Take 60 mg by mouth daily.    . clonazePAM (KLONOPIN) 0.5 MG tablet Take 0.5 mg by mouth at bedtime.    . febuxostat (ULORIC) 40 MG tablet Take 40 mg by mouth daily.     [name] gabapentin (NEURONTIN) 300 MG capsule Take 300 mg by mouth 3 (three) times daily as needed. For pain  3  . Insulin  Aspart Prot & Aspart (NOVOLOG MIX 70/30 PENFILL [place]) Inject 14 Units into the skin 2 (two) times daily after a meal. Takes 14 units am and evening Adjusts dose if CBG greater than 150    . Multiple Vitamins-Minerals (MEGA MULTIVITAMIN FOR MEN PO) Take by mouth.    . sevelamer carbonate (RENVELA) 800 MG tablet Take 800 mg by mouth 3 (three) times daily with meals.    . Timolol Maleate (ISTALOL) 0.5 % (DAILY) SOLN Place 1 drop into the left eye daily.    . carvedilol (COREG) 6.25 MG tablet TAKE 1 TABLET (6.25 MG TOTAL) BY MOUTH 2 (TWO) TIMES DAILY WITH A MEAL. 60 tablet 1  . carvedilol (COREG) 6.25 MG tablet TAKE 1 TABLET (6.25 MG TOTAL) BY MOUTH 2 (TWO) TIMES DAILY WITH A MEAL. 180 tablet 3   No facility-administered medications prior to visit.      Allergies:   Patient has no known allergies.   Social History   Social History  . Marital status: Single    Spouse name: N/A  . Number of children: N/A  . Years of education: N/A   Social History Main Topics  . Smoking status: Never Smoker  . Smokeless tobacco: Never Used  . Alcohol use No  . Drug use: No  . Sexual activity: Not Asked   Other Topics Concern  . None   Social History Narrative  . None     Family History:  The patient's Family history is unknown by patient.   ROS:   Please see the history of present illness.    Cough but otherwise no complaints.  All other systems reviewed and are negative.   PHYSICAL EXAM:   VS:  Pulse 98   Ht 5\' 5"  (1.651 m)   Wt 245 lb 6.4 oz (111.3 kg)   BMI 40.84 kg/m    GEN: Well nourished, well developed, in no acute distress  HEENT: normal  Neck: no JVD, carotid bruits, or masses Cardiac: Heart sounds are distant, RRR; no murmurs, rubs, or gallops,no edema  Respiratory:  clear to auscultation bilaterally, normal work of breathing GI: soft, nontender, nondistended, + BS MS: no deformity or atrophy  Skin: warm and dry, no rash Neuro:  Alert and Oriented x 3, Strength and sensation  are intact Psych: euthymic mood, full affect  Wt Readings from Last 3 Encounters:  03/08/16 245 lb 6.4 oz (111.3 kg)  06/11/15 240 lb (108.9 kg)  03/26/15 237 lb (107.5 kg)      Studies/Labs Reviewed:   EKG:  EKG  Sinus tachycardia/sinus rhythm with right axis deviation/left posterior fascicular block.  Recent Labs: 03/26/2015: Hemoglobin 17.3; Potassium 4.5; Sodium 140   Lipid Panel No results found for: CHOL, TRIG, HDL, CHOLHDL, VLDL, LDLCALC, LDLDIRECT  Additional studies/ records that were reviewed  today include:  No new functional data. He prefers to have all those done at the transplant center.    ASSESSMENT:    1. Chronic systolic heart failure ([place])   2. Essential hypertension   3. Non-ischemic cardiomyopathy ([place])   4. Chronic kidney disease with end stage renal disease on dialysis due to type 2 diabetes mellitus ([place])      PLAN:  In order of problems listed above:  1. Reduced LV function with EF less than 40%. No recent documentation. He prefers to have functional testing done at [facility] 2. Relatively low blood pressures with 98/70 mmHg pressure today. 3. Current status unknown 4. 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    Medication Adjustments/Labs and Tests Ordered: Current medicines are reviewed at length with the patient today.  Concerns regarding medicines are outlined above.  Medication changes, Labs and Tests ordered today are listed in the Patient Instructions below. There are no Patient Instructions on file for this visit.   Signed, Sinclair Grooms, MD  03/08/2016 9:21 AM    [place] [address], [place], [place]  [address] Phone: [phone]; Fax: [phone]

## 2016-03-09 DIAGNOSIS — N186 End stage renal disease: Secondary | ICD-10-CM | POA: Diagnosis not present

## 2016-03-09 DIAGNOSIS — N2581 Secondary hyperparathyroidism of renal origin: Secondary | ICD-10-CM | POA: Diagnosis not present

## 2016-03-09 DIAGNOSIS — Z23 Encounter for immunization: Secondary | ICD-10-CM | POA: Diagnosis not present

## 2016-03-09 DIAGNOSIS — D631 Anemia in chronic kidney disease: Secondary | ICD-10-CM | POA: Diagnosis not present

## 2016-03-11 DIAGNOSIS — Z23 Encounter for immunization: Secondary | ICD-10-CM | POA: Diagnosis not present

## 2016-03-11 DIAGNOSIS — D631 Anemia in chronic kidney disease: Secondary | ICD-10-CM | POA: Diagnosis not present

## 2016-03-11 DIAGNOSIS — N186 End stage renal disease: Secondary | ICD-10-CM | POA: Diagnosis not present

## 2016-03-11 DIAGNOSIS — N2581 Secondary hyperparathyroidism of renal origin: Secondary | ICD-10-CM | POA: Diagnosis not present

## 2016-03-13 ENCOUNTER — Inpatient Hospital Stay (HOSPITAL_COMMUNITY)
Admission: EM | Admit: 2016-03-13 | Discharge: 2016-03-14 | DRG: 291 | Disposition: A | Discharge status: Home / Self Care | Payer: Medicare Other | Attending: Family Medicine | Admitting: Family Medicine

## 2016-03-13 ENCOUNTER — Encounter (HOSPITAL_COMMUNITY): Payer: Self-pay | Admitting: Emergency Medicine

## 2016-03-13 ENCOUNTER — Emergency Department (HOSPITAL_COMMUNITY): Payer: Medicare Other

## 2016-03-13 DIAGNOSIS — I5042 Chronic combined systolic (congestive) and diastolic (congestive) heart failure: Secondary | ICD-10-CM | POA: Diagnosis present

## 2016-03-13 DIAGNOSIS — E875 Hyperkalemia: Secondary | ICD-10-CM | POA: Diagnosis not present

## 2016-03-13 DIAGNOSIS — I5022 Chronic systolic (congestive) heart failure: Secondary | ICD-10-CM | POA: Diagnosis present

## 2016-03-13 DIAGNOSIS — I10 Essential (primary) hypertension: Secondary | ICD-10-CM | POA: Diagnosis present

## 2016-03-13 DIAGNOSIS — W19XXXA Unspecified fall, initial encounter: Secondary | ICD-10-CM | POA: Diagnosis present

## 2016-03-13 DIAGNOSIS — N2581 Secondary hyperparathyroidism of renal origin: Secondary | ICD-10-CM | POA: Diagnosis present

## 2016-03-13 DIAGNOSIS — Z7982 Long term (current) use of aspirin: Secondary | ICD-10-CM | POA: Diagnosis not present

## 2016-03-13 DIAGNOSIS — D72829 Elevated white blood cell count, unspecified: Secondary | ICD-10-CM | POA: Diagnosis present

## 2016-03-13 DIAGNOSIS — Z79899 Other long term (current) drug therapy: Secondary | ICD-10-CM | POA: Diagnosis not present

## 2016-03-13 DIAGNOSIS — N186 End stage renal disease: Secondary | ICD-10-CM | POA: Diagnosis not present

## 2016-03-13 DIAGNOSIS — E1122 Type 2 diabetes mellitus with diabetic chronic kidney disease: Secondary | ICD-10-CM | POA: Diagnosis present

## 2016-03-13 DIAGNOSIS — M25561 Pain in right knee: Secondary | ICD-10-CM | POA: Diagnosis present

## 2016-03-13 DIAGNOSIS — F329 Major depressive disorder, single episode, unspecified: Secondary | ICD-10-CM | POA: Diagnosis present

## 2016-03-13 DIAGNOSIS — D631 Anemia in chronic kidney disease: Secondary | ICD-10-CM | POA: Diagnosis present

## 2016-03-13 DIAGNOSIS — G9340 Encephalopathy, unspecified: Secondary | ICD-10-CM | POA: Diagnosis not present

## 2016-03-13 DIAGNOSIS — I132 Hypertensive heart and chronic kidney disease with heart failure and with stage 5 chronic kidney disease, or end stage renal disease: Secondary | ICD-10-CM | POA: Diagnosis not present

## 2016-03-13 DIAGNOSIS — E1129 Type 2 diabetes mellitus with other diabetic kidney complication: Secondary | ICD-10-CM | POA: Diagnosis not present

## 2016-03-13 DIAGNOSIS — Z794 Long term (current) use of insulin: Secondary | ICD-10-CM

## 2016-03-13 DIAGNOSIS — E118 Type 2 diabetes mellitus with unspecified complications: Secondary | ICD-10-CM | POA: Diagnosis not present

## 2016-03-13 DIAGNOSIS — I255 Ischemic cardiomyopathy: Secondary | ICD-10-CM | POA: Diagnosis present

## 2016-03-13 DIAGNOSIS — IMO0002 Reserved for concepts with insufficient information to code with codable children: Secondary | ICD-10-CM | POA: Diagnosis present

## 2016-03-13 DIAGNOSIS — E7800 Pure hypercholesterolemia, unspecified: Secondary | ICD-10-CM | POA: Diagnosis present

## 2016-03-13 DIAGNOSIS — Z992 Dependence on renal dialysis: Secondary | ICD-10-CM | POA: Diagnosis not present

## 2016-03-13 DIAGNOSIS — E1165 Type 2 diabetes mellitus with hyperglycemia: Secondary | ICD-10-CM | POA: Diagnosis present

## 2016-03-13 DIAGNOSIS — R4182 Altered mental status, unspecified: Secondary | ICD-10-CM | POA: Diagnosis not present

## 2016-03-13 DIAGNOSIS — R29818 Other symptoms and signs involving the nervous system: Secondary | ICD-10-CM | POA: Diagnosis not present

## 2016-03-13 DIAGNOSIS — M25571 Pain in right ankle and joints of right foot: Secondary | ICD-10-CM | POA: Diagnosis present

## 2016-03-13 DIAGNOSIS — R278 Other lack of coordination: Secondary | ICD-10-CM

## 2016-03-13 DIAGNOSIS — E872 Acidosis: Secondary | ICD-10-CM | POA: Diagnosis present

## 2016-03-13 DIAGNOSIS — F32A Depression, unspecified: Secondary | ICD-10-CM | POA: Diagnosis present

## 2016-03-13 LAB — COMPREHENSIVE METABOLIC PANEL
ALT: 15 U/L — ABNORMAL LOW (ref 17–63)
AST: 33 U/L (ref 15–41)
Albumin: 3.8 g/dL (ref 3.5–5.0)
Alkaline Phosphatase: 144 U/L — ABNORMAL HIGH (ref 38–126)
Anion gap: 19 — ABNORMAL HIGH (ref 5–15)
BUN: 34 mg/dL — ABNORMAL HIGH (ref 6–20)
CO2: 22 mmol/L (ref 22–32)
Calcium: 9.2 mg/dL (ref 8.9–10.3)
Chloride: 97 mmol/L — ABNORMAL LOW (ref 101–111)
Creatinine, Ser: 12.26 mg/dL — ABNORMAL HIGH (ref 0.61–1.24)
GFR calc Af Amer: 5 mL/min — ABNORMAL LOW (ref >60)
GFR calc non Af Amer: 4 mL/min — ABNORMAL LOW (ref >60)
Glucose, Bld: 378 mg/dL — ABNORMAL HIGH (ref 65–99)
Potassium: 7.4 mmol/L (ref 3.5–5.1)
Sodium: 138 mmol/L (ref 135–145)
Total Bilirubin: 1.3 mg/dL — ABNORMAL HIGH (ref 0.3–1.2)
Total Protein: 7.7 g/dL (ref 6.5–8.1)

## 2016-03-13 LAB — CBC
HCT: 50.6 % (ref 39.0–52.0)
Hemoglobin: 16.4 g/dL (ref 13.0–17.0)
MCH: 36 pg — ABNORMAL HIGH (ref 26.0–34.0)
MCHC: 32.4 g/dL (ref 30.0–36.0)
MCV: 111.2 fL — ABNORMAL HIGH (ref 78.0–100.0)
Platelets: 187 10*3/uL (ref 150–400)
RBC: 4.55 MIL/uL (ref 4.22–5.81)
RDW: 15.7 % — ABNORMAL HIGH (ref 11.5–15.5)
WBC: 11.4 10*3/uL — ABNORMAL HIGH (ref 4.0–10.5)

## 2016-03-13 LAB — RENAL FUNCTION PANEL
Albumin: 3.8 g/dL (ref 3.5–5.0)
Albumin: 3.8 g/dL (ref 3.5–5.0)
Anion gap: 14 (ref 5–15)
Anion gap: 20 — ABNORMAL HIGH (ref 5–15)
BUN: 18 mg/dL (ref 6–20)
BUN: 12 mg/dL (ref 6–20)
CO2: 25 mmol/L (ref 22–32)
CO2: 21 mmol/L — ABNORMAL LOW (ref 22–32)
Calcium: 9.4 mg/dL (ref 8.9–10.3)
Calcium: 9.3 mg/dL (ref 8.9–10.3)
Chloride: 98 mmol/L — ABNORMAL LOW (ref 101–111)
Chloride: 96 mmol/L — ABNORMAL LOW (ref 101–111)
Creatinine, Ser: 7.57 mg/dL — ABNORMAL HIGH (ref 0.61–1.24)
Creatinine, Ser: 6.7 mg/dL — ABNORMAL HIGH (ref 0.61–1.24)
GFR calc Af Amer: 9 mL/min — ABNORMAL LOW (ref >60)
GFR calc Af Amer: 10 mL/min — ABNORMAL LOW (ref >60)
GFR calc non Af Amer: 7 mL/min — ABNORMAL LOW (ref >60)
GFR calc non Af Amer: 9 mL/min — ABNORMAL LOW (ref >60)
Glucose, Bld: 194 mg/dL — ABNORMAL HIGH (ref 65–99)
Glucose, Bld: 194 mg/dL — ABNORMAL HIGH (ref 65–99)
Phosphorus: 3 mg/dL (ref 2.5–4.6)
Phosphorus: 4.3 mg/dL (ref 2.5–4.6)
Potassium: 4 mmol/L (ref 3.5–5.1)
Potassium: 4.4 mmol/L (ref 3.5–5.1)
Sodium: 137 mmol/L (ref 135–145)
Sodium: 137 mmol/L (ref 135–145)

## 2016-03-13 LAB — DIFFERENTIAL
Basophils Absolute: 0 10*3/uL (ref 0.0–0.1)
Basophils Relative: 0 %
Eosinophils Absolute: 0 10*3/uL (ref 0.0–0.7)
Eosinophils Relative: 0 %
Lymphocytes Relative: 8 %
Lymphs Abs: 0.9 10*3/uL (ref 0.7–4.0)
Monocytes Absolute: 0.7 10*3/uL (ref 0.1–1.0)
Monocytes Relative: 6 %
Neutro Abs: 9.8 10*3/uL — ABNORMAL HIGH (ref 1.7–7.7)
Neutrophils Relative %: 86 %

## 2016-03-13 LAB — AMMONIA: Ammonia: 35 umol/L (ref 9–35)

## 2016-03-13 LAB — GLUCOSE, CAPILLARY
Glucose-Capillary: 215 mg/dL — ABNORMAL HIGH (ref 65–99)
Glucose-Capillary: 223 mg/dL — ABNORMAL HIGH (ref 65–99)

## 2016-03-13 LAB — I-STAT CHEM 8, ED
BUN: 53 mg/dL — ABNORMAL HIGH (ref 6–20)
Calcium, Ion: 0.85 mmol/L — CL (ref 1.15–1.40)
Chloride: 103 mmol/L (ref 101–111)
Creatinine, Ser: 12.9 mg/dL — ABNORMAL HIGH (ref 0.61–1.24)
Glucose, Bld: 383 mg/dL — ABNORMAL HIGH (ref 65–99)
HCT: 51 % (ref 39.0–52.0)
Hemoglobin: 17.3 g/dL — ABNORMAL HIGH (ref 13.0–17.0)
Potassium: 7.2 mmol/L (ref 3.5–5.1)
Sodium: 136 mmol/L (ref 135–145)
TCO2: 26 mmol/L (ref 0–100)

## 2016-03-13 LAB — POTASSIUM: Potassium: 5.8 mmol/L — ABNORMAL HIGH (ref 3.5–5.1)

## 2016-03-13 LAB — I-STAT TROPONIN, ED: Troponin i, poc: 0.01 ng/mL (ref 0.00–0.08)

## 2016-03-13 LAB — PROTIME-INR
INR: 0.99
Prothrombin Time: 13.1 seconds (ref 11.4–15.2)

## 2016-03-13 LAB — APTT: aPTT: 24 seconds (ref 24–36)

## 2016-03-13 LAB — CBG MONITORING, ED: Glucose-Capillary: 324 mg/dL — ABNORMAL HIGH (ref 65–99)

## 2016-03-13 LAB — CK: Total CK: 80 U/L (ref 49–397)

## 2016-03-13 LAB — TSH: TSH: 0.263 u[IU]/mL — ABNORMAL LOW (ref 0.350–4.500)

## 2016-03-13 LAB — PROCALCITONIN: Procalcitonin: 0.74 ng/mL

## 2016-03-13 LAB — SEDIMENTATION RATE: Sed Rate: 33 mm/hr — ABNORMAL HIGH (ref 0–16)

## 2016-03-13 MED ORDER — INSULIN ASPART 100 UNIT/ML ~~LOC~~ SOLN
10.0000 [IU] | Freq: Once | SUBCUTANEOUS | Status: AC
  Administered 2016-03-13: 10 [IU] via SUBCUTANEOUS
  Filled 2016-03-13: qty 1

## 2016-03-13 MED ORDER — ONDANSETRON HCL 4 MG/2ML IJ SOLN: 4.0000 mg | Freq: Four times a day (QID) | INTRAMUSCULAR | Status: DC | PRN

## 2016-03-13 MED ORDER — PENTAFLUOROPROP-TETRAFLUOROETH EX AERO: 1.0000 "application " | INHALATION_SPRAY | CUTANEOUS | Status: DC | PRN

## 2016-03-13 MED ORDER — SODIUM CHLORIDE 0.9% FLUSH
3.0000 mL | Freq: Two times a day (BID) | INTRAVENOUS | Status: DC
  Administered 2016-03-13: 3 mL via INTRAVENOUS

## 2016-03-13 MED ORDER — ACETAMINOPHEN 650 MG RE SUPP: 650.0000 mg | Freq: Four times a day (QID) | RECTAL | Status: DC | PRN

## 2016-03-13 MED ORDER — SODIUM CHLORIDE 0.9 % IV SOLN: 100.0000 mL | INTRAVENOUS | Status: DC | PRN

## 2016-03-13 MED ORDER — ACETAMINOPHEN 325 MG PO TABS: 650.0000 mg | ORAL_TABLET | Freq: Four times a day (QID) | ORAL | Status: DC | PRN

## 2016-03-13 MED ORDER — HEPARIN SODIUM (PORCINE) 1000 UNIT/ML DIALYSIS
1000.0000 [IU] | INTRAMUSCULAR | Status: DC | PRN
  Filled 2016-03-13: qty 1

## 2016-03-13 MED ORDER — ALTEPLASE 2 MG IJ SOLR: 2.0000 mg | Freq: Once | INTRAMUSCULAR | Status: DC | PRN

## 2016-03-13 MED ORDER — LIDOCAINE-PRILOCAINE 2.5-2.5 % EX CREA
1.0000 "application " | TOPICAL_CREAM | CUTANEOUS | Status: DC | PRN
  Filled 2016-03-13: qty 5

## 2016-03-13 MED ORDER — HEPARIN SODIUM (PORCINE) 5000 UNIT/ML IJ SOLN
5000.0000 [IU] | Freq: Three times a day (TID) | INTRAMUSCULAR | Status: DC
  Administered 2016-03-13 – 2016-03-14 (×2): 5000 [IU] via SUBCUTANEOUS
  Filled 2016-03-13 (×2): qty 1

## 2016-03-13 MED ORDER — LIDOCAINE HCL (PF) 1 % IJ SOLN
5.0000 mL | INTRAMUSCULAR | Status: DC | PRN
  Filled 2016-03-13: qty 5

## 2016-03-13 MED ORDER — INSULIN ASPART 100 UNIT/ML ~~LOC~~ SOLN
0.0000 [IU] | SUBCUTANEOUS | Status: DC
  Administered 2016-03-13 (×2): 5 [IU] via SUBCUTANEOUS
  Administered 2016-03-14 (×3): 3 [IU] via SUBCUTANEOUS
  Administered 2016-03-14: 2 [IU] via SUBCUTANEOUS

## 2016-03-13 MED ORDER — ONDANSETRON HCL 4 MG PO TABS: 4.0000 mg | ORAL_TABLET | Freq: Four times a day (QID) | ORAL | Status: DC | PRN

## 2016-03-13 MED ORDER — SODIUM CHLORIDE 0.9 % IV SOLN
1.0000 g | Freq: Once | INTRAVENOUS | Status: AC
  Administered 2016-03-13: 1 g via INTRAVENOUS
  Filled 2016-03-13: qty 10

## 2016-03-13 NOTE — ED Notes (Signed)
Attempted lab draw. Attempt unnsuccessful

## 2016-03-13 NOTE — Consult Note (Signed)
Neurology Consultation Reason for Consult: Fall Referring Physician: Hillard Danker  CC: Fall  History is obtained from: Patient  HPI: [name] [name] is a 50 y.o. male with a history of renal failure who presents with confusion and a fall. He states that he felt weak even on awakening this morning. He is mildly confused, but able to give some history.   LKW: 12/22 prior to bed tpa given?: no, out of window    ROS: A 14 point ROS was performed and is negative except as noted in the HPI.   Past Medical History:  Diagnosis Date  . Anal infection    posterior anal canal  . Anemia, chronic renal failure   . Atrophic kidney    BILATERAL  . DM type 2 causing ESRD [facility])    Nephrologist-- dr [name] [facility])--  on hemodialysis since June 2012 at  [name]  TTS  . Hemodialysis patient [facility])    at [place] on Tues/ Thur/Sat/schedule  . Hemorrhoids   . Hepatitis B antibody positive   . History of pleural effusion    bilateral  . Hyperparathyroidism, secondary renal ([place])   . Hypertension   . Ischemic cardiomyopathy    per echo 07-01-2014  ef 45%  . LAFB (left anterior fascicular block)   . Peripheral neuropathy ([place])   . Systolic and diastolic CHF, chronic ([place])    CARDIOLOGIST-  DR Daneen Schick ([place])  AND DR [name] ([name])     Family History  Problem Relation Age of Onset  . Family history unknown: Yes     Social History:  reports that he has never smoked. He has never used smokeless tobacco. He reports that he does not drink alcohol or use drugs.   Exam: Current vital signs: BP (!) 97/55 (BP Location: Left Arm)   Pulse (!) 104   Temp 98.6 F (37 C) (Oral)   Resp 17   Ht 5\' 5"  (1.651 m)   Wt 119.4 kg (263 lb 3.2 oz)   SpO2 100%   BMI 43.80 kg/m  Vital signs in last 24 hours: Temp:  [97.4 F (36.3 C)-98.6 F (37 C)] 98.6 F (37 C) (12/23 1719) Pulse Rate:  [81-116] 104 (12/23 1719) Resp:   [11-20] 17 (12/23 1719) BP: (83-157)/(19-136) 97/55 (12/23 1719) SpO2:  [96 %-100 %] 100 % (12/23 1719) Weight:  [108 kg (238 lb)-119.4 kg (263 lb 3.2 oz)] 119.4 kg (263 lb 3.2 oz) (12/23 1715)   Physical Exam  Constitutional: Appears obese Psych: Affect appropriate to situation Eyes: No scleral injection HENT: No OP obstrucion Head: Normocephalic.  Cardiovascular: Normal rate and regular rhythm.  Respiratory: Effort normal and breath sounds normal to anterior ascultation GI: He has a scar from his previous transplant Skin: WDI  Neuro: Mental Status: He is awake, able to answer questions and follow commands, but does have some mild confusion. Cranial Nerves: II: Visual Fields are full. Pupils are equal, round, and reactive to light.   III,IV, VI: EOMI without ptosis or diploplia.  V: Facial sensation is symmetric to temperature VII: Facial movement is symmetric.  VIII: hearing is intact to voice X: Uvula elevates symmetrically XI: Shoulder shrug is symmetric. XII: tongue is midline without atrophy or fasciculations.  Motor: Though he appears to have good strength throughout, this is limited because of frequent and severe asterixis Sensory: Sensation is symmetric to light touch and temperature in the arms and legs. Cerebellar: Limited by asterixis  I have reviewed labs in epic and the results pertinent to this consultation are: Potassium 7.4, elevated creatinine  I have reviewed the images obtained: CT head-no acute findings  Impression: 50 year old male with asterixis and confusion present on awakening. This constellation of symptoms is very much associated with toxic/metabolic causes as opposed to neurological etiologies. From his medication list, most likely culprit would be gabapentin, but my suspicion is this may represent  Recommendations: 1) ammonia 2) hold gabapentin 3) if the patient remains altered her with focal deficits following dialysis and holding of his  gabapentin, the neurology can be reconsulted. Otherwise, neurology will sign off at this time.  Roland Rack, MD [facility] [phone]  If 7pm- 7am, please page neurology on call as listed in [place].

## 2016-03-13 NOTE — Consult Note (Signed)
[facility] Renal Consultation Note    Indication for Consultation:  Management of ESRD/hemodialysis, anemia, hypertension/volume, and secondary hyperparathyroidism. PCP:  HPI: [name] [name] is a 50 y.o. male with ESRD, HTN, Type 2 DM, ischemic cardiomyopathy who is being admitted with AMS and hyperkalemia.   The patient is awake and can answer some questions, but cannot give a full history. Apparently, had a fall this morning and noted to be confused. In the ED, labs ok except high K and leukocytosis. He underwent head CT which showed no acute stroke, but "increased density of the basilar artery and possibly left MCA M2 branch." He denies chest pain or dyspnea. No abdominal pain. Has had intermittent muscle jerking.   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 His last HD was two days prior on 12/21 (confirmed by HD records), but his last clearance labs are not to goal (KT/v 1.2, URR 66% on 12/7). HD med list reviewed, no offensive medications. He is on gabapentin, if taking TID, this could certainly be contributing to his muscular jerking symptoms. He is afebrile.   Past Medical History:  Diagnosis Date  . Anal infection    posterior anal canal  . Anemia, chronic renal failure   . Atrophic kidney    BILATERAL  . DM type 2 causing ESRD [facility])    Nephrologist-- dr [name] [facility])--  on hemodialysis since June 2012 at  [name]  TTS  . Hemodialysis patient [facility])    at [place] on Tues/ Thur/Sat/schedule  . Hemorrhoids   . Hepatitis B antibody positive   . History of pleural effusion    bilateral  . Hyperparathyroidism, secondary renal ([place])   . Hypertension   . Ischemic cardiomyopathy    per echo 07-01-2014  ef 45%  . LAFB (left anterior fascicular block)   . Peripheral neuropathy ([place])   . Systolic and diastolic CHF, chronic ([place])    CARDIOLOGIST-  DR Daneen Schick ([place])  AND DR [name] ([name])   Past Surgical History:  Procedure Laterality Date  . APPENDECTOMY  09-12-2004   laparotomy w/ drainage peritinitis  . AV FISTULA PLACEMENT  02-27-2010   right forearm (RADIOCEPHALIC)  . AV FISTULA REPAIR  10-30-2010  . CARDIOVASCULAR STRESS TEST  10-29-2011   dr Daneen Schick   Low risk scan;  mild perfusion defect seen in the basal inferoseptal, basal inferior and mid inferior regions consistent with an infarct/scar and/or overlying attenuation/  mild to moderate global LVSF,  ef 40-45%  . DOBUTAMINE STRESS ECHO  07-23-2012   [name]   abnormal ;  at rest estimated lvef 25-30% and global severe LV hypokinesis ;  no cp during stress and achieved 85% maxium predicted heart rate;  negative stress ECG for inducible ischemia;  estimated lvef with stress 35-40%;  augmentation of wall segments consistant with cardiomyopathy and differential fibrosis  . FISTULOTOMY N/A 03/26/2015   Procedure: FISTULOTOMY;  Surgeon: [name], MD;  [facility];  Service: General;  Laterality: N/A;  . INCISION AND DRAINAGE ABSCESS N/A 03/26/2015   Procedure: ANAL INCISION AND DRAINAGE;  Surgeon: [name], MD;  Location: [place];  Service: General;  Laterality: N/A;  . RETINAL DETACHMENT SURGERY Left 2011   incomplete repair/ needs eye drops to keep pressure down  . TRANSTHORACIC ECHOCARDIOGRAM  07-01-2014    done at [facility]  Center   grade 1 diastolic dysfunction,  ef 45%/  trace TR and PR   Family History  Problem Relation Age of Onset  . Family history unknown: Yes   Social History:  reports that he has never smoked. He has never used smokeless tobacco. He reports that he does not drink alcohol or use drugs.  ROS: As per HPI otherwise negative. Limited due to AMS.  Physical Exam: Vitals:   03/13/16 1056 03/13/16 1100 03/13/16 1130 03/13/16 1145  BP: (!) 111/49 112/87 114/80 118/64  Pulse:  95 94 97 81  Resp: 18 13 16 11   Temp: 98.2 F (36.8 C)     TempSrc: Oral     SpO2: 96% 98% 96% 98%  Weight:      Height:         General: Well developed, well nourished, in no acute distress. Drowsy and confused. Head: Normocephalic, atraumatic, sclera non-icteric, mucus membranes are moist. Neck: Supple without lymphadenopathy/masses. JVD not elevated. Lungs: Clear bilaterally to auscultation anteriorly. Unable to cooperative for posterior exam. Heart: RRR with normal S1, S2. No murmurs, rubs, or gallops appreciated. Abdomen: Soft, non-tender, non-distended with normoactive bowel sounds. No rebound/guarding.  Lower extremities: No edema or ischemic changes, no open wounds. Neuro: Confused. Spontaneous muscle jerking noted. Dialysis Access: R AVF + thrill/bruit  No Known Allergies Prior to Admission medications   Medication Sig Start Date End Date Taking? Authorizing Provider  aspirin 81 MG tablet Take 81 mg by mouth daily.   Yes Historical Provider, MD  Calcium Acetate, Phos Binder, 667 MG CAPS Take 2 capsules by mouth 3 (three) times daily with meals.  06/02/10  Yes Historical Provider, MD  cinacalcet (SENSIPAR) 60 MG tablet Take 60 mg by mouth daily.   Yes Historical Provider, MD  clonazePAM (KLONOPIN) 0.5 MG tablet Take 0.5 mg by mouth at bedtime.   Yes Historical Provider, MD  cycloSPORINE (RESTASIS) 0.05 % ophthalmic emulsion Place 1 drop into both eyes 2 (two) times daily. 04/22/14  Yes Historical Provider, MD  febuxostat (ULORIC) 40 MG tablet Take 40 mg by mouth daily.    Yes Historical Provider, MD  ferric citrate (AURYXIA) 1 GM 210 MG(Fe) tablet Take 210 mg by mouth 3 (three) times daily. 05/23/14  Yes Historical Provider, MD  gabapentin (NEURONTIN) 300 MG capsule Take 300 mg by mouth 3 (three) times daily as needed. For pain 05/09/15  Yes Historical Provider, MD  Insulin Aspart Prot & Aspart (NOVOLOG MIX 70/30 PENFILL [place]) Inject 14 Units into the skin 2 (two) times daily after a  meal. Takes 14 units am and evening Adjusts dose if CBG greater than 150   Yes Historical Provider, MD  LIPITOR 10 MG tablet Take 10 mg by mouth daily. 01/08/16  Yes Historical Provider, MD  Multiple Vitamins-Minerals (MEGA MULTIVITAMIN FOR MEN PO) Take by mouth.   Yes Historical Provider, MD  sevelamer carbonate (RENVELA) 800 MG tablet Take 800 mg by mouth 3 (three) times daily with meals.   Yes Historical Provider, MD  Timolol Maleate (ISTALOL) 0.5 % (DAILY) SOLN Place 1 drop into the left eye daily.   Yes Historical Provider, MD   Current Facility-Administered Medications  Medication Dose Route Frequency Provider Last Rate Last Dose  . calcium gluconate 1 g in sodium chloride 0.9 % 100 mL IVPB  1 g Intravenous Once Dorie Rank, MD 110 mL/hr at 03/13/16 1120 1 g at 03/13/16 1120   Current Outpatient Prescriptions  Medication Sig Dispense Refill  . aspirin  81 MG tablet Take 81 mg by mouth daily.    . Calcium Acetate, Phos Binder, 667 MG CAPS Take 2 capsules by mouth 3 (three) times daily with meals.     . cinacalcet (SENSIPAR) 60 MG tablet Take 60 mg by mouth daily.    . clonazePAM (KLONOPIN) 0.5 MG tablet Take 0.5 mg by mouth at bedtime.    . cycloSPORINE (RESTASIS) 0.05 % ophthalmic emulsion Place 1 drop into both eyes 2 (two) times daily.    . febuxostat (ULORIC) 40 MG tablet Take 40 mg by mouth daily.     . ferric citrate (AURYXIA) 1 GM 210 MG(Fe) tablet Take 210 mg by mouth 3 (three) times daily.    [name] gabapentin (NEURONTIN) 300 MG capsule Take 300 mg by mouth 3 (three) times daily as needed. For pain  3  . Insulin Aspart Prot & Aspart (NOVOLOG MIX 70/30 PENFILL [place]) Inject 14 Units into the skin 2 (two) times daily after a meal. Takes 14 units am and evening Adjusts dose if CBG greater than 150    . LIPITOR 10 MG tablet Take 10 mg by mouth daily.  4  . Multiple Vitamins-Minerals (MEGA MULTIVITAMIN FOR MEN PO) Take by mouth.    . sevelamer carbonate (RENVELA) 800 MG tablet Take 800 mg by  mouth 3 (three) times daily with meals.    . Timolol Maleate (ISTALOL) 0.5 % (DAILY) SOLN Place 1 drop into the left eye daily.     Labs: Basic Metabolic Panel:  Recent Labs Lab 03/13/16 1038  NA 136  K 7.2*  CL 103  GLUCOSE 383*  BUN 53*  CREATININE 12.90*   CBC:  Recent Labs Lab 03/13/16 1038 03/13/16 1121  WBC  --  11.4*  NEUTROABS  --  PENDING  HGB 17.3* 16.4  HCT 51.0 50.6  MCV  --  111.2*  PLT  --  187   CBG:  Recent Labs Lab 03/13/16 1036  GLUCAP 324*   Studies/Results: Ct Head Code Stroke W/o Cm  Result Date: 03/13/2016 CLINICAL DATA:  Code stroke.  Weakness. EXAM: CT HEAD WITHOUT CONTRAST TECHNIQUE: Contiguous axial images were obtained from the base of the skull through the vertex without intravenous contrast. COMPARISON:  None. FINDINGS: Brain: There is no evidence of acute cortical infarct, intracranial hemorrhage, mass, midline shift, or extra-axial fluid collection. The ventricles and sulci are normal. Vascular: Calcified atherosclerosis at the skullbase. Increased density of the basilar artery. Questionable increased proximal left M2 MCA density. Skull: No fracture or focal osseous lesion. Sinuses/Orbits: The visualized paranasal sinuses and mastoid air cells are clear. Orbits are unremarkable. Other: None. ASPECTS [facility] Stroke Program Early CT Score) - Ganglionic level infarction (caudate, lentiform nuclei, internal capsule, insula, M1-M3 cortex): 7 - Supraganglionic infarction (M4-M6 cortex): 3 Total score (0-10 with 10 being normal): 10 IMPRESSION: 1. No evidence of intracranial hemorrhage or acute cortical infarct. 2. ASPECTS is 10. 3. Increased density of the basilar artery and possibly left MCA M2 branch. Consider further evaluation with CTA as clinically warranted. These results were called by telephone at the time of interpretation on 03/13/2016 at 10:32 am to Dr. Leonel Ramsay, who verbally acknowledged these results. Electronically Signed   By: Logan Bores M.D.   On: 03/13/2016 10:33    Dialysis Orders:  [place] on TTS 3:45 hours, F200 dialyzer, EDW 108.2kg, 2K/2.5Ca, BFR 400/DFR 800, R AVF - Heparin 6000 unit bolus + 500/hr pump - Hectoral 2.15mcg q HD - No ESA (high  Hgb)  Assessment/Plan: 1.  AMS: Cause unclear, metabolic v. infectious v. medications. Hopefully he will clear a little after HD. Per primary. 2.  Hyperkalemia: K 7.2. Starting dialysis now to correct this with 1K bath. No heparin with HD today.  3.  ESRD: Continue TTS schedule. 4.  Hypertension/volume: BP ok, no signs of volume overload. Low UF goal today (1L). 5.  Anemia: Hgb 16.4. No ESA needed. 6.  Metabolic bone disease: Labs pending. Continue binders for now. 7.  DM: On insulin. Per primary. 8.  Ischemic cardiomyopathy (EF 45% 2016): Per primary.  Veneta Penton, PA-C 03/13/2016, 12:17 PM  [facility] Pager: [phone]

## 2016-03-13 NOTE — ED Provider Notes (Signed)
[facility] DEPT Provider Note   CSN: [id number] Arrival date & time: 03/13/16  2952     History   Chief Complaint Chief Complaint  Patient presents with  . Fall  . Weakness    HPI [name] [name] is a 50 y.o. male.  HPI Patient presents to the emergency room for evaluation of altered mental status and weakness.  Patient's last time known to be normal was last night. History is limited because of difficult for the patient to communicate clearly with [place].   He is confused.  Pt had a fall this morning.  The circumstances are unclear.  He is having weakness in his extremities.  He denies headache chest pain or abdominal pain. Patient is on dialysis. He states he had dialysis earlier this week. Is having difficulty coming the exact day. Past Medical History:  Diagnosis Date  . Anal infection    posterior anal canal  . Anemia, chronic renal failure   . Atrophic kidney    BILATERAL  . DM type 2 causing ESRD [facility])    Nephrologist-- dr [name] [facility])--  on hemodialysis since June 2012 at  [name]  TTS  . Hemodialysis patient [facility])    at [place] on Tues/ Thur/Sat/schedule  . Hemorrhoids   . Hepatitis B antibody positive   . History of pleural effusion    bilateral  . Hyperparathyroidism, secondary renal ([place])   . Hypertension   . Ischemic cardiomyopathy    per echo 07-01-2014  ef 45%  . LAFB (left anterior fascicular block)   . Peripheral neuropathy ([place])   . Systolic and diastolic CHF, chronic ([place])    CARDIOLOGIST-  DR Daneen Schick ([place])  AND DR [name] ([name])    Patient Active Problem List   Diagnosis Date Noted  . Type II diabetes mellitus with complication ([place]) 84/13/2440  . Non-ischemic cardiomyopathy ([place]) 05/03/2014  . HYPERCHOLESTEROLEMIA 08/19/2009  . Essential hypertension 08/19/2009  . Chronic systolic heart failure ([place]) 08/19/2009  . Chronic kidney disease with end stage renal  disease on dialysis due to type 2 diabetes mellitus ([place]) 08/19/2009  . DEPRESSION, HX OF 08/19/2009    Past Surgical History:  Procedure Laterality Date  . APPENDECTOMY  09-12-2004   laparotomy w/ drainage peritinitis  . AV FISTULA PLACEMENT  02-27-2010   right forearm (RADIOCEPHALIC)  . AV FISTULA REPAIR  10-30-2010  . CARDIOVASCULAR STRESS TEST  10-29-2011   dr Daneen Schick   Low risk scan;  mild perfusion defect seen in the basal inferoseptal, basal inferior and mid inferior regions consistent with an infarct/scar and/or overlying attenuation/  mild to moderate global LVSF,  ef 40-45%  . DOBUTAMINE STRESS ECHO  07-23-2012   [name]   abnormal ;  at rest estimated lvef 25-30% and global severe LV hypokinesis ;  no cp during stress and achieved 85% maxium predicted heart rate;  negative stress ECG for inducible ischemia;  estimated lvef with stress 35-40%;  augmentation of wall segments consistant with cardiomyopathy and differential fibrosis  . FISTULOTOMY N/A 03/26/2015   Procedure: FISTULOTOMY;  Surgeon: [name], MD;  Location[facility];  Service: General;  Laterality: N/A;  . INCISION AND DRAINAGE ABSCESS N/A 03/26/2015   Procedure: ANAL INCISION AND DRAINAGE;  Surgeon: [name], MD;  Location: [place];  Service: General;  Laterality: N/A;  . RETINAL DETACHMENT SURGERY Left 2011   incomplete repair/ needs eye drops to keep pressure  down  . TRANSTHORACIC ECHOCARDIOGRAM  07-01-2014    done at [facility]   grade 1 diastolic dysfunction,  ef 45%/  trace TR and PR       Home Medications    Prior to Admission medications   Medication Sig Start Date End Date Taking? Authorizing Provider  aspirin 81 MG tablet Take 81 mg by mouth daily.   Yes Historical Provider, MD  Calcium Acetate, Phos Binder, 667 MG CAPS Take 2 capsules by mouth 3 (three) times daily with meals.  06/02/10  Yes Historical Provider, MD  cinacalcet (SENSIPAR) 60  MG tablet Take 60 mg by mouth daily.   Yes Historical Provider, MD  clonazePAM (KLONOPIN) 0.5 MG tablet Take 0.5 mg by mouth at bedtime.   Yes Historical Provider, MD  cycloSPORINE (RESTASIS) 0.05 % ophthalmic emulsion Place 1 drop into both eyes 2 (two) times daily. 04/22/14  Yes Historical Provider, MD  febuxostat (ULORIC) 40 MG tablet Take 40 mg by mouth daily.    Yes Historical Provider, MD  ferric citrate (AURYXIA) 1 GM 210 MG(Fe) tablet Take 210 mg by mouth 3 (three) times daily. 05/23/14  Yes Historical Provider, MD  gabapentin (NEURONTIN) 300 MG capsule Take 300 mg by mouth 3 (three) times daily as needed. For pain 05/09/15  Yes Historical Provider, MD  Insulin Aspart Prot & Aspart (NOVOLOG MIX 70/30 PENFILL [place]) Inject 14 Units into the skin 2 (two) times daily after a meal. Takes 14 units am and evening Adjusts dose if CBG greater than 150   Yes Historical Provider, MD  LIPITOR 10 MG tablet Take 10 mg by mouth daily. 01/08/16  Yes Historical Provider, MD  Multiple Vitamins-Minerals (MEGA MULTIVITAMIN FOR MEN PO) Take by mouth.   Yes Historical Provider, MD  sevelamer carbonate (RENVELA) 800 MG tablet Take 800 mg by mouth 3 (three) times daily with meals.   Yes Historical Provider, MD  Timolol Maleate (ISTALOL) 0.5 % (DAILY) SOLN Place 1 drop into the left eye daily.   Yes Historical Provider, MD    Family History Family History  Problem Relation Age of Onset  . Family history unknown: Yes    Social History Social History  Substance Use Topics  . Smoking status: Never Smoker  . Smokeless tobacco: Never Used  . Alcohol use No     Allergies   Patient has no known allergies.   Review of Systems Review of Systems  Unable to perform ROS: Mental status change     Physical Exam Updated Vital Signs BP 118/64   Pulse 81   Temp 98.2 F (36.8 C) (Oral)   Resp 11   Ht 5\' 5"  (1.651 m)   Wt 108 kg   SpO2 98%   BMI 39.61 kg/m   Physical Exam  Constitutional: No distress.    HENT:  Head: Normocephalic and atraumatic.  Right Ear: External ear normal.  Left Ear: External ear normal.  Eyes: Conjunctivae are normal. Right eye exhibits no discharge. Left eye exhibits no discharge. No scleral icterus.  Neck: Neck supple. No tracheal deviation present.  Cardiovascular: Regular rhythm and intact distal pulses.  Tachycardia present.   Pulmonary/Chest: Effort normal and breath sounds normal. No stridor. No respiratory distress. He has no wheezes. He has no rales.  Abdominal: Soft. Bowel sounds are normal. He exhibits no distension. There is no tenderness. There is no rebound and no guarding.  Scar right lower quadrant consistent with prior kidney transplant  Musculoskeletal: He exhibits no edema or  tenderness.  Neurological: He is alert. He displays tremor. No cranial nerve deficit (no facial droop, extraocular movements intact, no slurred speech) or sensory deficit. He exhibits normal muscle tone. He displays no seizure activity. Coordination abnormal. GCS eye subscore is 4. GCS verbal subscore is 4. GCS motor subscore is 6.  Generalized weakness, tremulousness in all 4 extremities; patient is confused and only intermittently answers questions, he is slow to respond, he will move all 4 extremities and hold them up against gravity  Skin: Skin is warm and dry. No rash noted. He is not diaphoretic.  Psychiatric: He has a normal mood and affect.  Nursing note and vitals reviewed.    ED Treatments / Results  Labs (all labs ordered are listed, but only abnormal results are displayed) Labs Reviewed  CBC - Abnormal; Notable for the following:       Result Value   WBC 11.4 (*)    MCV 111.2 (*)    MCH 36.0 (*)    RDW 15.7 (*)    All other components within normal limits  CBG MONITORING, ED - Abnormal; Notable for the following:    Glucose-Capillary 324 (*)    All other components within normal limits  I-STAT CHEM 8, ED - Abnormal; Notable for the following:    Potassium  7.2 (*)    BUN 53 (*)    Creatinine, Ser 12.90 (*)    Glucose, Bld 383 (*)    Calcium, Ion 0.85 (*)    Hemoglobin 17.3 (*)    All other components within normal limits  DIFFERENTIAL  PROTIME-INR  APTT  COMPREHENSIVE METABOLIC PANEL  AMMONIA  I-STAT TROPOININ, ED  CBG MONITORING, ED    EKG  EKG Interpretation  Date/Time:  Saturday March 13 2016 09:52:54 EST Ventricular Rate:  102 PR Interval:  158 QRS Duration: 84 QT Interval:  350 QTC Calculation: 456 R Axis:   127 Text Interpretation:  Sinus tachycardia Left posterior fascicular block Anterior infarct , age undetermined Abnormal ECG Since last tracing rate faster Confirmed by Sharmarke Cicio  MD-J, Ajwa Kimberley ([id number]) on 03/13/2016 10:13:01 AM       Radiology Ct Head Code Stroke W/o Cm  Result Date: 03/13/2016 CLINICAL DATA:  Code stroke.  Weakness. EXAM: CT HEAD WITHOUT CONTRAST TECHNIQUE: Contiguous axial images were obtained from the base of the skull through the vertex without intravenous contrast. COMPARISON:  None. FINDINGS: Brain: There is no evidence of acute cortical infarct, intracranial hemorrhage, mass, midline shift, or extra-axial fluid collection. The ventricles and sulci are normal. Vascular: Calcified atherosclerosis at the skullbase. Increased density of the basilar artery. Questionable increased proximal left M2 MCA density. Skull: No fracture or focal osseous lesion. Sinuses/Orbits: The visualized paranasal sinuses and mastoid air cells are clear. Orbits are unremarkable. Other: None. ASPECTS [facility] Stroke Program Early CT Score) - Ganglionic level infarction (caudate, lentiform nuclei, internal capsule, insula, M1-M3 cortex): 7 - Supraganglionic infarction (M4-M6 cortex): 3 Total score (0-10 with 10 being normal): 10 IMPRESSION: 1. No evidence of intracranial hemorrhage or acute cortical infarct. 2. ASPECTS is 10. 3. Increased density of the basilar artery and possibly left MCA M2 branch. Consider further evaluation with  CTA as clinically warranted. These results were called by telephone at the time of interpretation on 03/13/2016 at 10:32 am to Dr. Leonel Ramsay, who verbally acknowledged these results. Electronically Signed   By: Logan Bores M.D.   On: 03/13/2016 10:33    Procedures Procedures (including critical care time)  Medications Ordered in ED  Medications  calcium gluconate 1 g in sodium chloride 0.9 % 100 mL IVPB (1 g Intravenous New Bag/Given 03/13/16 1120)  insulin aspart (novoLOG) injection 10 Units (10 Units Subcutaneous Given 03/13/16 1059)     Initial Impression / Assessment and Plan / ED Course  I have reviewed the triage vital signs and the nursing notes.  Pertinent labs & imaging results that were available during my care of the patient were reviewed by me and considered in my medical decision making (see chart for details).  Clinical Course as of Mar 13 1201  Sat Mar 13, 2016  1050 Patient's initial labs show an elevated potassium of 7.2. He also has elevated BUN and creatinine consistent with his chronic kidney failure. I do not see any acute EKG changes but I have ordered insulin and calcium gluconate.  [JK]  1051 Mental status changes are unclear at this point. Patient seems to be tremulous and confused but he does not have any focal neurologic deficits. Ammonia level is pending.  [ZJ]  6734 D/w Dr Leonel Ramsay.  Suspect metabolic issue.  Cleared from a code stroke standpoint  [JK]    Clinical Course User Index [JK] Dorie Rank, MD   Patient presented to the emergency room with concerns for possible stroke. Patient was evaluated by neurology. Symptoms more likely metabolic in nature. Patient does have acute renal failure with hyperkalemia. I spoke with Dr. Justin Mend and the patient is going to dialysis now  12:02 PM . Several of his laboratory tests are still pending.  Patient will need to be admitted to the hospital to continue his evaluation.   Final Clinical Impressions(s) / ED  Diagnoses   Final diagnoses:  Hyperkalemia  Altered mental status, unspecified altered mental status type      Dorie Rank, MD 03/13/16 1202

## 2016-03-13 NOTE — ED Notes (Signed)
Lab called to report blood hemolyzed and Potassium 7.4.  Carney Harder, RN @ dialysis to report critical lab.  Per Dane, RN she has already sent another sample to lab.

## 2016-03-13 NOTE — Progress Notes (Signed)
[name] [name] [id number] Admission Data: 03/13/2016 6:48 PM Attending Provider: Elwin Mocha, MD  EOF:[id number] [name], MD Consults/ Treatment Team: Treatment Team:  Catarina Hartshorn, MD Edrick Oh, MD  [name] is a 50 y.o. male patient admitted from ED awake, alert  & orientated  X 3,  Full Code, VSS - Blood pressure (!) 97/55, pulse (!) 104, temperature 98.6 F (37 C), temperature source Oral, resp. rate 17, height 5\' 5"  (1.651 m), weight 119.4 kg (263 lb 3.2 oz), SpO2 100 %.,, no c/o shortness of breath, no c/o chest pain, no distress noted. Tele # 12 placed and pt is currently running:normal sinus rhythm.   IV site WDL:  hand left, condition patent and no redness with a transparent dsg that's clean dry and intact.  Allergies:  No Known Allergies   Past Medical History:  Diagnosis Date  . Anal infection    posterior anal canal  . Anemia, chronic renal failure   . Atrophic kidney    BILATERAL  . DM type 2 causing ESRD [facility])    Nephrologist-- dr [name] [facility])--  on hemodialysis since June 2012 at  [name]  TTS  . Hemodialysis patient [facility])    at [place] on Tues/ Thur/Sat/schedule  . Hemorrhoids   . Hepatitis B antibody positive   . History of pleural effusion    bilateral  . Hyperparathyroidism, secondary renal ([place])   . Hypertension   . Ischemic cardiomyopathy    per echo 07-01-2014  ef 45%  . LAFB (left anterior fascicular block)   . Peripheral neuropathy ([place])   . Systolic and diastolic CHF, chronic ([place])    CARDIOLOGIST-  DR Daneen Schick ([place])  AND DR [name] ([name])    History:  obtained from chart review. Tobacco/alcohol: denied none  Pt orientation to unit, room and routine. Information packet given to patient/family and safety video watched.  Admission INP armband ID verified with patient/family, and in place. SR up x 2, fall risk assessment complete with Patient and family  verbalizing understanding of risks associated with falls. Pt verbalizes an understanding of how to use the call bell and to call for help before getting out of bed.  Skin, clean-dry- intact without evidence of bruising, or skin tears.   No evidence of skin break down noted on exam. no rashes, no ecchymoses, no petechiae, no nodules, no jaundice, no purpura, no wounds. Admitted from dialysis where they took off 900cc of fluid. Pt very drowsy. Answers questions after arousing several times. Will repeat answers. Grip is good, lifted arms and legs with no drift. Strength good in legs. Will follow fingers with eyes but can not touch finger to nose.     Will cont to monitor and assist as needed.  Salley Slaughter, RN 03/13/2016 6:48 PM

## 2016-03-13 NOTE — ED Notes (Signed)
RT at bedside obtaining ABG

## 2016-03-13 NOTE — H&P (Signed)
History and Physical    [name] [name] MWN:[id number] DOB: Oct 23, 1965 DOA: 03/13/2016   PCP: Leola Brazil, MD   Patient coming from/Resides with: Private residence  Admission status: Inpatient/telemetry -medically necessary to stay a minimum 2 midnights to rule out impending and/or unexpected changes in physiologic status that may differ from initial evaluation performed in the ER and/or at time of admission. Patient presents with acute encephalopathy of unknown etiology. In addition he is found to be hyperglycemic with hyperkalemia requiring urgent hemodialysis. Unclear if patient volume overloaded. It is suspected he will require at least 2 consecutive hemodialysis sessions before discharge and only if mentation improves.  Chief Complaint: Altered mental status  HPI: [name] [name] is a 50 y.o. male with medical history significant for chronic kidney disease on dialysis (MWF) and history of immediate failed renal transplantation, ischemic cardiomyopathy with chronic systolic heart failure, hypertension, diabetes on the depression and dyslipidemia. Patient was brought to the ER through EMS for reports of altered mentation and weakness. Last apparent normal was yesterday evening. Initially felt to be code stroke but CT head unremarkable and no focal neurological deficit detected upon arrival. Patient gives a limited history because of his confusion. He apparently had a fall prior to arriving to the hospital. He reports feeling weakness especially in his legs. Denies cause fevers or chills. States does not make urine. States completed full dialysis session this past Wednesday. Initial labs in the ER revealed a leukocytosis of 11,400, glucose 383, BUN 53 and creatinine 12.9, potassium 7.2. Nephrology was notified and patient was urgently taken to dialysis. Prior to dialysis he was given 10 units IV regular insulin with 1 g calcium gluconate for cardioprotection in setting of hyperkalemia. Upon  my discussion with the patient he also denied any new or changed medications.  ED Course:  Vital Signs: BP 118/64   Pulse 81   Temp 98.2 F (36.8 C) (Oral)   Resp 11   Ht '5\' 5"'$  (1.651 m)   Wt 108 kg (238 lb)   SpO2 98%   BMI 39.61 kg/m  CT head code stroke without contrast: No evidence of intracranial hemorrhage or acute cortical infarct. Increased density of the basilar artery and possible left MCA M2 branch (Dr. Leonel Ramsay with neurology was called with results of CT scan) Lab data: Sodium 136, potassium 7.2, chloride 103, BUN 53, creatinine 12.9, glucose 383, calcium 0.85, anion gap 19, alkaline phosphatase 144, AST and ALT not elevated, ammonia 35, total bilirubin 1.3, poc troponin 0.01, white count 11,400 neutrophils 86%, absolute neutrophils 9.8%, hemoglobin 16.4, MCV 111.2, platelets 187,000, coags normal Medications and treatments: Regular insulin 10 units 1, calcium gluconate 1 g IV 1  Review of Systems:  In addition to the HPI above, **History has been difficult to obtain given patient's altered mentation-please refer to history of present illness     Past Medical History:  Diagnosis Date  . Anal infection    posterior anal canal  . Anemia, chronic renal failure   . Atrophic kidney    BILATERAL  . DM type 2 causing ESRD [facility])    Nephrologist-- dr [name] [facility])--  on hemodialysis since June 2012 at  [name]  TTS  . Hemodialysis patient [facility])    at [place] on Tues/ Thur/Sat/schedule  . Hemorrhoids   . Hepatitis B antibody positive   . History of pleural effusion    bilateral  . Hyperparathyroidism, secondary renal ([place])   . Hypertension   .  Ischemic cardiomyopathy    per echo 07-01-2014  ef 45%  . LAFB (left anterior fascicular block)   . Peripheral neuropathy ([name])   . Systolic and diastolic CHF, chronic ([name])    CARDIOLOGIST-  DR Verdis Prime [facility] [place])  AND DR Shelbie Ammons ([name])    Past  Surgical History:  Procedure Laterality Date  . APPENDECTOMY  09-12-2004   laparotomy w/ drainage peritinitis  . AV FISTULA PLACEMENT  02-27-2010   right forearm (RADIOCEPHALIC)  . AV FISTULA REPAIR  10-30-2010  . CARDIOVASCULAR STRESS TEST  10-29-2011   dr Verdis Prime   Low risk scan;  mild perfusion defect seen in the basal inferoseptal, basal inferior and mid inferior regions consistent with an infarct/scar and/or overlying attenuation/  mild to moderate global LVSF,  ef 40-45%  . DOBUTAMINE STRESS ECHO  07-23-2012   [name]   abnormal ;  at rest estimated lvef 25-30% and global severe LV hypokinesis ;  no cp during stress and achieved 85% maxium predicted heart rate;  negative stress ECG for inducible ischemia;  estimated lvef with stress 35-40%;  augmentation of wall segments consistant with cardiomyopathy and differential fibrosis  . FISTULOTOMY N/A 03/26/2015   Procedure: FISTULOTOMY;  Surgeon: Romie Levee, MD;  Location: [facility];  Service: General;  Laterality: N/A;  . INCISION AND DRAINAGE ABSCESS N/A 03/26/2015   Procedure: ANAL INCISION AND DRAINAGE;  Surgeon: Romie Levee, MD;  Location: [facility];  Service: General;  Laterality: N/A;  . RETINAL DETACHMENT SURGERY Left 2011   incomplete repair/ needs eye drops to keep pressure down  . TRANSTHORACIC ECHOCARDIOGRAM  07-01-2014    done at [facility]   grade 1 diastolic dysfunction,  ef 45%/  trace TR and PR    Social History   Social History  . Marital status: Single    Spouse name: N/A  . Number of children: N/A  . Years of education: N/A   Occupational History  . Not on file.   Social History Main Topics  . Smoking status: Never Smoker  . Smokeless tobacco: Never Used  . Alcohol use No  . Drug use: No  . Sexual activity: Not on file   Other Topics Concern  . Not on file   Social History Narrative  . No narrative on file    Mobility: Without assistive  devices Work history: Disabled   No Known Allergies  Family History  Problem Relation Age of Onset  . Family history unknown: Yes   Unable to obtain accurate history given patient's altered mentation  Prior to Admission medications   Medication Sig Start Date End Date Taking? Authorizing Provider  aspirin 81 MG tablet Take 81 mg by mouth daily.   Yes Historical Provider, MD  Calcium Acetate, Phos Binder, 667 MG CAPS Take 2 capsules by mouth 3 (three) times daily with meals.  06/02/10  Yes Historical Provider, MD  cinacalcet (SENSIPAR) 60 MG tablet Take 60 mg by mouth daily.   Yes Historical Provider, MD  clonazePAM (KLONOPIN) 0.5 MG tablet Take 0.5 mg by mouth at bedtime.   Yes Historical Provider, MD  cycloSPORINE (RESTASIS) 0.05 % ophthalmic emulsion Place 1 drop into both eyes 2 (two) times daily. 04/22/14  Yes Historical Provider, MD  febuxostat (ULORIC) 40 MG tablet Take 40 mg by mouth daily.    Yes Historical Provider, MD  ferric citrate (AURYXIA) 1 GM 210 MG(Fe) tablet Take 210 mg by mouth 3 (three)  times daily. 05/23/14  Yes Historical Provider, MD  gabapentin (NEURONTIN) 300 MG capsule Take 300 mg by mouth 3 (three) times daily as needed. For pain 05/09/15  Yes Historical Provider, MD  Insulin Aspart Prot & Aspart (NOVOLOG MIX 70/30 PENFILL Trooper) Inject 14 Units into the skin 2 (two) times daily after a meal. Takes 14 units am and evening Adjusts dose if CBG greater than 150   Yes Historical Provider, MD  LIPITOR 10 MG tablet Take 10 mg by mouth daily. 01/08/16  Yes Historical Provider, MD  Multiple Vitamins-Minerals (MEGA MULTIVITAMIN FOR MEN PO) Take by mouth.   Yes Historical Provider, MD  sevelamer carbonate (RENVELA) 800 MG tablet Take 800 mg by mouth 3 (three) times daily with meals.   Yes Historical Provider, MD  Timolol Maleate (ISTALOL) 0.5 % (DAILY) SOLN Place 1 drop into the left eye daily.   Yes Historical Provider, MD    Physical Exam: Vitals:   03/13/16 1056 03/13/16  1100 03/13/16 1130 03/13/16 1145  BP: (!) 111/49 112/87 114/80 118/64  Pulse: 95 94 97 81  Resp: '18 13 16 11  '$ Temp: 98.2 F (36.8 C)     TempSrc: Oral     SpO2: 96% 98% 96% 98%  Weight:      Height:          Constitutional: NAD, Lethargic but awakens and attempts to answer some questions asked Eyes: PERRL, lids and conjunctivae normal ENMT: Mucous membranes are moist. Posterior pharynx clear of any exudate or lesions.Normal dentition.  Neck: normal, supple, no masses, no thyromegaly Respiratory: clear to auscultation bilaterally, no wheezing, no crackles. Normal respiratory effort. No accessory muscle use.  Cardiovascular: Regular rate and rhythm, no murmurs / rubs / gallops. No extremity edema. 2+ pedal pulses. No carotid bruits.  Abdomen: no tenderness, no masses palpated. No hepatosplenomegaly. Bowel sounds positive. Abdomen somewhat distended Musculoskeletal: no clubbing / cyanosis. No joint deformity upper and lower extremities. Good ROM, no contractures. Normal muscle tone.  Skin: no rashes, lesions, ulcers. No induration Neurologic: CN 2-12 grossly intact. moves all extremities purposefully and equally, strength is difficult to test secondary to patient's inability to participate consistently -sensation appears to be generally intact throughout his body Psychiatric: Awakens to tactile stimulation and loud voice but continually drifts back to sleep   Labs on Admission: I have personally reviewed following labs and imaging studies  CBC:  Recent Labs Lab 03/13/16 1038 03/13/16 1121  WBC  --  11.4*  NEUTROABS  --  PENDING  HGB 17.3* 16.4  HCT 51.0 50.6  MCV  --  111.2*  PLT  --  989   Basic Metabolic Panel:  Recent Labs Lab 03/13/16 1038 03/13/16 1121  NA 136 138  K 7.2* 7.4*  CL 103 97*  CO2  --  22  GLUCOSE 383* 378*  BUN 53* 34*  CREATININE 12.90* 12.26*  CALCIUM  --  9.2   GFR: Estimated Creatinine Clearance: 8.2 mL/min (by C-G formula based on SCr of  12.26 mg/dL (H)). Liver Function Tests:  Recent Labs Lab 03/13/16 1121  AST 33  ALT 15*  ALKPHOS 144*  BILITOT 1.3*  PROT 7.7  ALBUMIN 3.8   No results for input(s): LIPASE, AMYLASE in the last 168 hours.  Recent Labs Lab 03/13/16 1121  AMMONIA 35   Coagulation Profile:  Recent Labs Lab 03/13/16 1121  INR 0.99   Cardiac Enzymes: No results for input(s): CKTOTAL, CKMB, CKMBINDEX, TROPONINI in the last 168 hours. BNP (last  3 results) No results for input(s): PROBNP in the last 8760 hours. HbA1C: No results for input(s): HGBA1C in the last 72 hours. CBG:  Recent Labs Lab 03/13/16 1036  GLUCAP 324*   Lipid Profile: No results for input(s): CHOL, HDL, LDLCALC, TRIG, CHOLHDL, LDLDIRECT in the last 72 hours. Thyroid Function Tests: No results for input(s): TSH, T4TOTAL, FREET4, T3FREE, THYROIDAB in the last 72 hours. Anemia Panel: No results for input(s): VITAMINB12, FOLATE, FERRITIN, TIBC, IRON, RETICCTPCT in the last 72 hours. Urine analysis: No results found for: COLORURINE, APPEARANCEUR, LABSPEC, PHURINE, GLUCOSEU, HGBUR, BILIRUBINUR, KETONESUR, PROTEINUR, UROBILINOGEN, NITRITE, LEUKOCYTESUR Sepsis Labs: '@LABRCNTIP'$ (procalcitonin:4,lacticidven:4) )No results found for this or any previous visit (from the past 240 hour(s)).   Radiological Exams on Admission: Ct Head Code Stroke W/o Cm  Result Date: 03/13/2016 CLINICAL DATA:  Code stroke.  Weakness. EXAM: CT HEAD WITHOUT CONTRAST TECHNIQUE: Contiguous axial images were obtained from the base of the skull through the vertex without intravenous contrast. COMPARISON:  None. FINDINGS: Brain: There is no evidence of acute cortical infarct, intracranial hemorrhage, mass, midline shift, or extra-axial fluid collection. The ventricles and sulci are normal. Vascular: Calcified atherosclerosis at the skullbase. Increased density of the basilar artery. Questionable increased proximal left M2 MCA density. Skull: No fracture or  focal osseous lesion. Sinuses/Orbits: The visualized paranasal sinuses and mastoid air cells are clear. Orbits are unremarkable. Other: None. ASPECTS [facility] Stroke Program Early CT Score) - Ganglionic level infarction (caudate, lentiform nuclei, internal capsule, insula, M1-M3 cortex): 7 - Supraganglionic infarction (M4-M6 cortex): 3 Total score (0-10 with 10 being normal): 10 IMPRESSION: 1. No evidence of intracranial hemorrhage or acute cortical infarct. 2. ASPECTS is 10. 3. Increased density of the basilar artery and possibly left MCA M2 branch. Consider further evaluation with CTA as clinically warranted. These results were called by telephone at the time of interpretation on 03/13/2016 at 10:32 am to Dr. Leonel Ramsay, who verbally acknowledged these results. Electronically Signed   By: Logan Bores M.D.   On: 03/13/2016 10:33    EKG: (Independently reviewed) sinus tachycardia with ventricular rate 102 bpm, QTC 456 ms, left posterior fascicular block, peaked T waves  Assessment/Plan Principal Problem:   Acute encephalopathy -Patient presents with acute onset of altered mentation and generalized weakness of unclear etiology -BUN only mildly elevated in a patient with chronic kidney disease -Ammonia level is normal; check TSH -Patient does not have fever and does not endorse specific infectious symptoms but does have mild leukocytosis and unexplained hyperglycemia so we'll obtain blood cultures, check ESR and check Procalcitonin -NPO until more alert -Neuro checks every 4 hours -Patient prescribed Klonopin 0.5 daily at bedtime and Neurontin 300 3 times a day prn-both of these meds can contribute to altered mentation  Active Problems:   CKD (chronic kidney disease) stage V requiring chronic dialysis/Acute hyperkalemia -Urgent hemodialysis and process -Last dialyzed in [place] on Wednesday and only signed off 8 minutes early -Has associated metabolic acidemia with mildly elevated anion  gap -Telemetry bed until potassium normalized    Essential hypertension -Not on medications prior to admission and suspect this problem has resolved with initiation of hemodialysis    Diabetes mellitus type 2, uncontrolled  -Current CBGs uncontrolled with readings near 400 -Hold 70/30 insulin while NPO -CBGs every 4 hours -SSI -HgbA1c    HYPERCHOLESTEROLEMIA -On statin at home so check CK    Chronic systolic heart failure  -On exam does not appear to be volume overloaded -Primary treatment with dialysis  Depression -On Klonopin prior to admission      DVT prophylaxis: Subcutaneous heparin  Code Status: Full  Family Communication: No family at bedside at time of evaluation Disposition Plan: Anticipate discharge back to preadmission home environment pending stability and return of mentation Consults called: Nephrology/[place]    Aishah Teffeteller L. ANP-BC [facility] Pager [phone]   If 7PM-7AM, please contact night-coverage [email or web address] Password [facility]  03/13/2016, 12:38 PM

## 2016-03-14 DIAGNOSIS — N186 End stage renal disease: Secondary | ICD-10-CM

## 2016-03-14 DIAGNOSIS — E1165 Type 2 diabetes mellitus with hyperglycemia: Secondary | ICD-10-CM

## 2016-03-14 DIAGNOSIS — Z794 Long term (current) use of insulin: Secondary | ICD-10-CM

## 2016-03-14 DIAGNOSIS — I10 Essential (primary) hypertension: Secondary | ICD-10-CM

## 2016-03-14 DIAGNOSIS — Z992 Dependence on renal dialysis: Secondary | ICD-10-CM

## 2016-03-14 DIAGNOSIS — E875 Hyperkalemia: Secondary | ICD-10-CM

## 2016-03-14 DIAGNOSIS — E118 Type 2 diabetes mellitus with unspecified complications: Secondary | ICD-10-CM

## 2016-03-14 LAB — COMPREHENSIVE METABOLIC PANEL
ALT: 12 U/L — ABNORMAL LOW (ref 17–63)
AST: 14 U/L — ABNORMAL LOW (ref 15–41)
Albumin: 3.4 g/dL — ABNORMAL LOW (ref 3.5–5.0)
Alkaline Phosphatase: 132 U/L — ABNORMAL HIGH (ref 38–126)
Anion gap: 16 — ABNORMAL HIGH (ref 5–15)
BUN: 15 mg/dL (ref 6–20)
CO2: 24 mmol/L (ref 22–32)
Calcium: 9.4 mg/dL (ref 8.9–10.3)
Chloride: 98 mmol/L — ABNORMAL LOW (ref 101–111)
Creatinine, Ser: 8.69 mg/dL — ABNORMAL HIGH (ref 0.61–1.24)
GFR calc Af Amer: 7 mL/min — ABNORMAL LOW (ref >60)
GFR calc non Af Amer: 6 mL/min — ABNORMAL LOW (ref >60)
Glucose, Bld: 146 mg/dL — ABNORMAL HIGH (ref 65–99)
Potassium: 4.1 mmol/L (ref 3.5–5.1)
Sodium: 138 mmol/L (ref 135–145)
Total Bilirubin: 0.8 mg/dL (ref 0.3–1.2)
Total Protein: 7.5 g/dL (ref 6.5–8.1)

## 2016-03-14 LAB — CBC WITH DIFFERENTIAL/PLATELET
Basophils Absolute: 0 10*3/uL (ref 0.0–0.1)
Basophils Relative: 0 %
Eosinophils Absolute: 0.1 10*3/uL (ref 0.0–0.7)
Eosinophils Relative: 2 %
HCT: 50.1 % (ref 39.0–52.0)
Hemoglobin: 16.4 g/dL (ref 13.0–17.0)
Lymphocytes Relative: 16 %
Lymphs Abs: 1.2 10*3/uL (ref 0.7–4.0)
MCH: 36.3 pg — ABNORMAL HIGH (ref 26.0–34.0)
MCHC: 32.7 g/dL (ref 30.0–36.0)
MCV: 110.8 fL — ABNORMAL HIGH (ref 78.0–100.0)
Monocytes Absolute: 0.5 10*3/uL (ref 0.1–1.0)
Monocytes Relative: 7 %
Neutro Abs: 5.6 10*3/uL (ref 1.7–7.7)
Neutrophils Relative %: 75 %
Platelets: 126 10*3/uL — ABNORMAL LOW (ref 150–400)
RBC: 4.52 MIL/uL (ref 4.22–5.81)
RDW: 15.6 % — ABNORMAL HIGH (ref 11.5–15.5)
WBC: 7.4 10*3/uL (ref 4.0–10.5)

## 2016-03-14 LAB — RENAL FUNCTION PANEL
Albumin: 3.3 g/dL — ABNORMAL LOW (ref 3.5–5.0)
Anion gap: 17 — ABNORMAL HIGH (ref 5–15)
BUN: 16 mg/dL (ref 6–20)
CO2: 21 mmol/L — ABNORMAL LOW (ref 22–32)
Calcium: 9.3 mg/dL (ref 8.9–10.3)
Chloride: 98 mmol/L — ABNORMAL LOW (ref 101–111)
Creatinine, Ser: 8.55 mg/dL — ABNORMAL HIGH (ref 0.61–1.24)
GFR calc Af Amer: 7 mL/min — ABNORMAL LOW (ref >60)
GFR calc non Af Amer: 6 mL/min — ABNORMAL LOW (ref >60)
Glucose, Bld: 141 mg/dL — ABNORMAL HIGH (ref 65–99)
Phosphorus: 5.5 mg/dL — ABNORMAL HIGH (ref 2.5–4.6)
Potassium: 4.2 mmol/L (ref 3.5–5.1)
Sodium: 136 mmol/L (ref 135–145)

## 2016-03-14 LAB — GLUCOSE, CAPILLARY
Glucose-Capillary: 146 mg/dL — ABNORMAL HIGH (ref 65–99)
Glucose-Capillary: 154 mg/dL — ABNORMAL HIGH (ref 65–99)
Glucose-Capillary: 157 mg/dL — ABNORMAL HIGH (ref 65–99)
Glucose-Capillary: 172 mg/dL — ABNORMAL HIGH (ref 65–99)

## 2016-03-14 NOTE — Progress Notes (Signed)
[facility]   Patient seen and examined  Patient bact to baseline after HD - K level within normal limits C/o knee pain after fall - recommended knee brace and Tylenol for pain, no xray needed patient have full ROM and no swelling.  Nephrologist cleared for discharge - Resume dialysis TTS Will discharge home  For full details see discharge summary   Chipper Oman, MD

## 2016-03-14 NOTE — Progress Notes (Signed)
[facility] Progress Note   Subjective:  Seen in room. No CP, dyspnea, fever, N/V/D. Mental status now clear, back to baseline per family. He does endorse some ankle and knee pain which he thinks is related to the fall he had prior to admit. Still having mild muscle jerking, but no asterixis on exam.  Objective Vitals:   03/14/16 0038 03/14/16 0242 03/14/16 0431 03/14/16 0453  BP: (!) 94/59 (!) 93/52 (!) 100/53   Pulse: 99 94 98   Resp: 18 18 18    Temp: 99.9 F (37.7 C) 99.3 F (37.4 C) 99.4 F (37.4 C)   TempSrc: Oral Oral Oral   SpO2: 100% 100% 100%   Weight:    108.6 kg (239 lb 6.4 oz)  Height:       Physical Exam General: Well appearing male, NAD. Heart: RRR; no murmur. Lungs: CTAB. Abdomen: soft, non-tender Extremities: No LE edema, tender R ankle and knee without obvious deformity Dialysis Access: R AVF + thrill  Additional Objective Labs: Basic Metabolic Panel:  Recent Labs Lab 03/13/16 1244 03/13/16 1616 03/13/16 1752 03/14/16 0430  NA 137  --  137 138  136  K 4.0 5.8* 4.4 4.1  4.2  CL 98*  --  96* 98*  98*  CO2 25  --  21* 24  21*  GLUCOSE 194*  --  194* 146*  141*  BUN 18  --  12 15  16   CREATININE 7.57*  --  6.70* 8.69*  8.55*  CALCIUM 9.4  --  9.3 9.4  9.3  PHOS 3.0  --  4.3 5.5*   Liver Function Tests:  Recent Labs Lab 03/13/16 1121 03/13/16 1244 03/13/16 1752 03/14/16 0430  AST 33  --   --  14*  ALT 15*  --   --  12*  ALKPHOS 144*  --   --  132*  BILITOT 1.3*  --   --  0.8  PROT 7.7  --   --  7.5  ALBUMIN 3.8 3.8 3.8 3.4*  3.3*   CBC:  Recent Labs Lab 03/13/16 1038 03/13/16 1121 03/14/16 0430  WBC  --  11.4* 7.4  NEUTROABS  --  9.8* 5.6  HGB 17.3* 16.4 16.4  HCT 51.0 50.6 50.1  MCV  --  111.2* 110.8*  PLT  --  187 126*   Blood Culture    Component Value Date/Time   SDES BLOOD LEFT ANTECUBITAL 03/13/2016 1553   SPECREQUEST BOTTLES DRAWN AEROBIC ONLY 5CC 03/13/2016 1553   CULT NO GROWTH < 24 HOURS  03/13/2016 1553   REPTSTATUS PENDING 03/13/2016 1553    Cardiac Enzymes:  Recent Labs Lab 03/13/16 1616  CKTOTAL 80   CBG:  Recent Labs Lab 03/13/16 1750 03/13/16 2033 03/14/16 0036 03/14/16 0427 03/14/16 0755  GLUCAP 215* 223* 172* 154* 146*   Studies/Results: Ct Head Code Stroke W/o Cm  Result Date: 03/13/2016 CLINICAL DATA:  Code stroke.  Weakness. EXAM: CT HEAD WITHOUT CONTRAST TECHNIQUE: Contiguous axial images were obtained from the base of the skull through the vertex without intravenous contrast. COMPARISON:  None. FINDINGS: Brain: There is no evidence of acute cortical infarct, intracranial hemorrhage, mass, midline shift, or extra-axial fluid collection. The ventricles and sulci are normal. Vascular: Calcified atherosclerosis at the skullbase. Increased density of the basilar artery. Questionable increased proximal left M2 MCA density. Skull: No fracture or focal osseous lesion. Sinuses/Orbits: The visualized paranasal sinuses and mastoid air cells are clear. Orbits are unremarkable. Other: None. ASPECTS [facility]  Stroke Program Early CT Score) - Ganglionic level infarction (caudate, lentiform nuclei, internal capsule, insula, M1-M3 cortex): 7 - Supraganglionic infarction (M4-M6 cortex): 3 Total score (0-10 with 10 being normal): 10 IMPRESSION: 1. No evidence of intracranial hemorrhage or acute cortical infarct. 2. ASPECTS is 10. 3. Increased density of the basilar artery and possibly left MCA M2 branch. Consider further evaluation with CTA as clinically warranted. These results were called by telephone at the time of interpretation on 03/13/2016 at 10:32 am to Dr. Leonel Ramsay, who verbally acknowledged these results. Electronically Signed   By: Logan Bores M.D.   On: 03/13/2016 10:33   Medications:  . heparin  5,000 Units Subcutaneous Q8H  . insulin aspart  0-15 Units Subcutaneous Q4H  . sodium chloride flush  3 mL Intravenous Q12H    Dialysis Orders: [facility] on TTS 3:45 hours, F200 dialyzer, EDW 108.2kg, 2K/2.5Ca, BFR 400/DFR 800, R AVF - Heparin 6000 unit bolus + 500/hr pump - Hectoral 2.35mcg q HD - No ESA (high Hgb)  Assessment/Plan: 1.  AMS: Cause unclear, but likely related to electrolytes since much improved/resolved today. Still with occ muscle jerks. He reports that he only used gabapentin prn, just once in the week leading to admission. BCx 12/23 negative so far. 2.  Hyperkalemia: K 7.2 on admit, resolved s/p HD 12/23. We discussed diet causes to avoid, he does not admit to any high K foods. 3.  ESRD: Continue TTS schedule, next due on 12/26 if still inpatient. 4.  Hypertension/volume: BP ok, no signs of volume overload. 5.  Anemia: Hgb 16.4. No ESA needed. 6.  Metabolic bone disease: Ca/Phos ok. Resume binders with meals/sensipar nightly. 7.  DM: On insulin. Per primary. 8.  Ischemic cardiomyopathy (EF 45% 2016): Per primary.   Veneta Penton, PA-C 03/14/2016, 9:07 AM  [facility] Pager: [phone]

## 2016-03-14 NOTE — Discharge Summary (Signed)
Physician Discharge Summary  [name] [name]  IDP:[id number]  DOB: November 02, 1965  DOA: 03/13/2016 PCP: Leola Brazil, MD  Admit date: 03/13/2016 Discharge date: 03/14/2016  Admitted From: Home  Disposition:  Home   Recommendations for Outpatient Follow-up:  1. Follow up with PCP in 1-2 weeks 2. Please obtain BMP/CBC in one week 3. Please follow up on the following pending results: Final blood cultures, A1C  Discharge Condition: Stable  CODE STATUS: FULL Diet recommendation: Heart Healthy / Carb Modified    Brief/Interim Summary: 50 y/o M with PMHx of ESRD on HD TTS, cardiomyopathy with CHF, HTN, DM and depression, bough by EMS to the ED with AMS and weakness. Patient reports that he fell yesterday after he felt weak. When he was brought to the ED code stroke was called. Also was found to be hyperkalemic and was hemodialyzed emergently , bringing his potasium down to normal level. Subsequently after dialysis patient came back to baseline mentally.   Subjective: Patient seen and examined feeling well, wants to go home. Patient was cleared by nephrologist for discharge.   Discharge Diagnoses/Hospital Course:   Acute encephalopathy - 2/2 to uremia and hyperkalemia,  Initial presentation acute onset of altered mentation and generalized weakness of unclear etiology Ammonia level is normal Patient prescribed Klonopin 0.5 daily at bedtime and Neurontin 300 3 times a day prn-both of these meds can contribute to altered mentation Resolved after hemodialysis   Knee pain - likely strain from fall Tylenol PNR pain  Knee brace  Follow up with PCP   CKD (chronic kidney disease) stage V requiring chronic dialysis/Acute hyperkalemia HD per nephrology schedule  Follow up with nephrology   Essential hypertension - not on medication since in HD   Diabetes mellitus type 2, uncontrolled  CBGs controlled after admission  Continue home medications   Chronic systolic heart failure   No signs of fluid overload   Depression Continue home medication   Discharge Instructions  Discharge Instructions    Call MD for:  difficulty breathing, headache or visual disturbances    Complete by:  As directed    Call MD for:  extreme fatigue    Complete by:  As directed    Call MD for:  hives    Complete by:  As directed    Call MD for:  persistant dizziness or light-headedness    Complete by:  As directed    Call MD for:  persistant nausea and vomiting    Complete by:  As directed    Call MD for:  redness, tenderness, or signs of infection (pain, swelling, redness, odor or green/yellow discharge around incision site)    Complete by:  As directed    Call MD for:  severe uncontrolled pain    Complete by:  As directed    Call MD for:  temperature >100.4    Complete by:  As directed    Diet - low sodium heart healthy    Complete by:  As directed    Increase activity slowly    Complete by:  As directed      Allergies as of 03/14/2016   No Known Allergies     Medication List    TAKE these medications   aspirin 81 MG tablet Take 81 mg by mouth daily.   AURYXIA 1 GM 210 MG(Fe) tablet Generic drug:  ferric citrate Take 210 mg by mouth 3 (three) times daily.   calcium acetate 667 MG capsule Commonly known as:  PHOSLO Take  2 capsules by mouth 3 (three) times daily with meals.   cinacalcet 60 MG tablet Commonly known as:  SENSIPAR Take 60 mg by mouth daily.   clonazePAM 0.5 MG tablet Commonly known as:  KLONOPIN Take 0.5 mg by mouth at bedtime.   gabapentin 300 MG capsule Commonly known as:  NEURONTIN Take 300 mg by mouth 3 (three) times daily as needed. For pain   ISTALOL 0.5 % (DAILY) Soln Generic drug:  Timolol Maleate Place 1 drop into the left eye daily.   LIPITOR 10 MG tablet Generic drug:  atorvastatin Take 10 mg by mouth daily.   MEGA MULTIVITAMIN FOR MEN PO Take by mouth.   NOVOLOG MIX 70/30 PENFILL [place] Inject 14 Units into the skin 2 (two)  times daily after a meal. Takes 14 units am and evening Adjusts dose if CBG greater than 150   RESTASIS 0.05 % ophthalmic emulsion Generic drug:  cycloSPORINE Place 1 drop into both eyes 2 (two) times daily.   sevelamer carbonate 800 MG tablet Commonly known as:  RENVELA Take 800 mg by mouth 3 (three) times daily with meals.   ULORIC 40 MG tablet Generic drug:  febuxostat Take 40 mg by mouth daily.       No Known Allergies  Consultations:  Nephrology   Procedures/Studies: Ct Head Code Stroke W/o Cm  Result Date: 03/13/2016 CLINICAL DATA:  Code stroke.  Weakness. EXAM: CT HEAD WITHOUT CONTRAST TECHNIQUE: Contiguous axial images were obtained from the base of the skull through the vertex without intravenous contrast. COMPARISON:  None. FINDINGS: Brain: There is no evidence of acute cortical infarct, intracranial hemorrhage, mass, midline shift, or extra-axial fluid collection. The ventricles and sulci are normal. Vascular: Calcified atherosclerosis at the skullbase. Increased density of the basilar artery. Questionable increased proximal left M2 MCA density. Skull: No fracture or focal osseous lesion. Sinuses/Orbits: The visualized paranasal sinuses and mastoid air cells are clear. Orbits are unremarkable. Other: None. ASPECTS [facility] Stroke Program Early CT Score) - Ganglionic level infarction (caudate, lentiform nuclei, internal capsule, insula, M1-M3 cortex): 7 - Supraganglionic infarction (M4-M6 cortex): 3 Total score (0-10 with 10 being normal): 10 IMPRESSION: 1. No evidence of intracranial hemorrhage or acute cortical infarct. 2. ASPECTS is 10. 3. Increased density of the basilar artery and possibly left MCA M2 branch. Consider further evaluation with CTA as clinically warranted. These results were called by telephone at the time of interpretation on 03/13/2016 at 10:32 am to Dr. Leonel Ramsay, who verbally acknowledged these results. Electronically Signed   By: Logan Bores M.D.   On:  03/13/2016 10:33     Discharge Exam: Vitals:   03/14/16 0431 03/14/16 1335  BP: (!) 100/53 (!) 90/47  Pulse: 98 (!) 133  Resp: 18 18  Temp: 99.4 F (37.4 C) 98.6 F (37 C)   Vitals:   03/14/16 0242 03/14/16 0431 03/14/16 0453 03/14/16 1335  BP: (!) 93/52 (!) 100/53  (!) 90/47  Pulse: 94 98  (!) 133  Resp: 18 18  18   Temp: 99.3 F (37.4 C) 99.4 F (37.4 C)  98.6 F (37 C)  TempSrc: Oral Oral  Oral  SpO2: 100% 100%  (!) 65%  Weight:   108.6 kg (239 lb 6.4 oz)   Height:        General: Pt is alert, awake, not in acute distress Cardiovascular: RRR, S1/S2 +, no rubs, no gallops Respiratory: CTA bilaterally, no wheezing, no rhonchi Abdominal: Soft, NT, ND, bowel sounds + Extremities: no edema, no  cyanosis    The results of significant diagnostics from this hospitalization (including imaging, microbiology, ancillary and laboratory) are listed below for reference.     Microbiology: Recent Results (from the past 240 hour(s))  Culture, blood (Routine X 2) w Reflex to ID Panel     Status: None (Preliminary result)   Collection Time: 03/13/16  2:25 PM  Result Value Ref Range Status   Specimen Description BLOOD HEMODIALYSIS CATHETER  Final   Special Requests BOTTLES DRAWN AEROBIC AND ANAEROBIC 10CC  Final   Culture NO GROWTH < 24 HOURS  Final   Report Status PENDING  Incomplete  Culture, blood (Routine X 2) w Reflex to ID Panel     Status: None (Preliminary result)   Collection Time: 03/13/16  3:53 PM  Result Value Ref Range Status   Specimen Description BLOOD LEFT ANTECUBITAL  Final   Special Requests BOTTLES DRAWN AEROBIC ONLY 5CC  Final   Culture NO GROWTH < 24 HOURS  Final   Report Status PENDING  Incomplete     Labs: BNP (last 3 results) No results for input(s): BNP in the last 8760 hours. Basic Metabolic Panel:  Recent Labs Lab 03/13/16 1038 03/13/16 1121 03/13/16 1244 03/13/16 1616 03/13/16 1752 03/14/16 0430  NA 136 138 137  --  137 138  136  K 7.2*  7.4* 4.0 5.8* 4.4 4.1  4.2  CL 103 97* 98*  --  96* 98*  98*  CO2  --  22 25  --  21* 24  21*  GLUCOSE 383* 378* 194*  --  194* 146*  141*  BUN 53* 34* 18  --  12 15  16   CREATININE 12.90* 12.26* 7.57*  --  6.70* 8.69*  8.55*  CALCIUM  --  9.2 9.4  --  9.3 9.4  9.3  PHOS  --   --  3.0  --  4.3 5.5*   Liver Function Tests:  Recent Labs Lab 03/13/16 1121 03/13/16 1244 03/13/16 1752 03/14/16 0430  AST 33  --   --  14*  ALT 15*  --   --  12*  ALKPHOS 144*  --   --  132*  BILITOT 1.3*  --   --  0.8  PROT 7.7  --   --  7.5  ALBUMIN 3.8 3.8 3.8 3.4*  3.3*   No results for input(s): LIPASE, AMYLASE in the last 168 hours.  Recent Labs Lab 03/13/16 1121  AMMONIA 35   CBC:  Recent Labs Lab 03/13/16 1038 03/13/16 1121 03/14/16 0430  WBC  --  11.4* 7.4  NEUTROABS  --  9.8* 5.6  HGB 17.3* 16.4 16.4  HCT 51.0 50.6 50.1  MCV  --  111.2* 110.8*  PLT  --  187 126*   Cardiac Enzymes:  Recent Labs Lab 03/13/16 1616  CKTOTAL 80   BNP: Invalid input(s): POCBNP CBG:  Recent Labs Lab 03/13/16 2033 03/14/16 0036 03/14/16 0427 03/14/16 0755 03/14/16 1146  GLUCAP 223* 172* 154* 146* 157*   D-Dimer No results for input(s): DDIMER in the last 72 hours. Hgb A1c No results for input(s): HGBA1C in the last 72 hours. Lipid Profile No results for input(s): CHOL, HDL, LDLCALC, TRIG, CHOLHDL, LDLDIRECT in the last 72 hours. Thyroid function studies  Recent Labs  03/13/16 1754  TSH 0.263*   Anemia work up No results for input(s): VITAMINB12, FOLATE, FERRITIN, TIBC, IRON, RETICCTPCT in the last 72 hours. Urinalysis No results found for: COLORURINE, APPEARANCEUR, [place], [place], [name],  HGBUR, BILIRUBINUR, KETONESUR, PROTEINUR, UROBILINOGEN, NITRITE, LEUKOCYTESUR Sepsis Labs Invalid input(s): PROCALCITONIN,  WBC,  LACTICIDVEN Microbiology Recent Results (from the past 240 hour(s))  Culture, blood (Routine X 2) w Reflex to ID Panel     Status: None  (Preliminary result)   Collection Time: 03/13/16  2:25 PM  Result Value Ref Range Status   Specimen Description BLOOD HEMODIALYSIS CATHETER  Final   Special Requests BOTTLES DRAWN AEROBIC AND ANAEROBIC 10CC  Final   Culture NO GROWTH < 24 HOURS  Final   Report Status PENDING  Incomplete  Culture, blood (Routine X 2) w Reflex to ID Panel     Status: None (Preliminary result)   Collection Time: 03/13/16  3:53 PM  Result Value Ref Range Status   Specimen Description BLOOD LEFT ANTECUBITAL  Final   Special Requests BOTTLES DRAWN AEROBIC ONLY 5CC  Final   Culture NO GROWTH < 24 HOURS  Final   Report Status PENDING  Incomplete     Time coordinating discharge: Over 30 minutes  SIGNED:  Chipper Oman, MD  [facility] 03/14/2016, 10:36 PM Pager   If 7PM-7AM, please contact night-coverage [email or web address] Password [email or web address]

## 2016-03-14 NOTE — Progress Notes (Signed)
2Orthopedic Tech Progress Note Patient Details:  [name] [name] 1966/02/28 [id number]  Ortho Devices Type of Ortho Device: Knee Immobilizer Ortho Device/Splint Location: rle Ortho Device/Splint Interventions: Application   Carter Kassel 03/14/2016, 1:58 PM

## 2016-03-14 NOTE — Progress Notes (Signed)
Patient was discharged home by MD order; discharged instructions  review and give to patient with care notes; IV DIC; right knee immobilizer in place; patient will be escorted to the car by nurse tech via wheelchair.

## 2016-03-16 DIAGNOSIS — N186 End stage renal disease: Secondary | ICD-10-CM | POA: Diagnosis not present

## 2016-03-16 DIAGNOSIS — N2581 Secondary hyperparathyroidism of renal origin: Secondary | ICD-10-CM | POA: Diagnosis not present

## 2016-03-16 DIAGNOSIS — Z23 Encounter for immunization: Secondary | ICD-10-CM | POA: Diagnosis not present

## 2016-03-16 DIAGNOSIS — D631 Anemia in chronic kidney disease: Secondary | ICD-10-CM | POA: Diagnosis not present

## 2016-03-16 LAB — HEMOGLOBIN A1C
Hgb A1c MFr Bld: 10.7 % — ABNORMAL HIGH (ref 4.8–5.6)
Mean Plasma Glucose: 260 mg/dL

## 2016-03-17 LAB — POCT I-STAT, CHEM 8
BUN: 27 mg/dL — ABNORMAL HIGH (ref 6–20)
Calcium, Ion: 1.17 mmol/L (ref 1.15–1.40)
Chloride: 99 mmol/L — ABNORMAL LOW (ref 101–111)
Creatinine, Ser: 9.1 mg/dL — ABNORMAL HIGH (ref 0.61–1.24)
Glucose, Bld: 216 mg/dL — ABNORMAL HIGH (ref 65–99)
HCT: 55 % — ABNORMAL HIGH (ref 39.0–52.0)
Hemoglobin: 18.7 g/dL — ABNORMAL HIGH (ref 13.0–17.0)
Potassium: 4 mmol/L (ref 3.5–5.1)
Sodium: 140 mmol/L (ref 135–145)
TCO2: 25 mmol/L (ref 0–100)

## 2016-03-18 DIAGNOSIS — N186 End stage renal disease: Secondary | ICD-10-CM | POA: Diagnosis not present

## 2016-03-18 DIAGNOSIS — Z23 Encounter for immunization: Secondary | ICD-10-CM | POA: Diagnosis not present

## 2016-03-18 DIAGNOSIS — N2581 Secondary hyperparathyroidism of renal origin: Secondary | ICD-10-CM | POA: Diagnosis not present

## 2016-03-18 DIAGNOSIS — D631 Anemia in chronic kidney disease: Secondary | ICD-10-CM | POA: Diagnosis not present

## 2016-03-18 LAB — CULTURE, BLOOD (ROUTINE X 2)
Culture: NO GROWTH
Culture: NO GROWTH

## 2016-03-18 LAB — POCT I-STAT, CHEM 8
BUN: 53 mg/dL — ABNORMAL HIGH (ref 6–20)
Calcium, Ion: 0.85 mmol/L — CL (ref 1.15–1.40)
Chloride: 103 mmol/L (ref 101–111)
Creatinine, Ser: 12.9 mg/dL — ABNORMAL HIGH (ref 0.61–1.24)
Glucose, Bld: 383 mg/dL — ABNORMAL HIGH (ref 65–99)
HCT: 51 % (ref 39.0–52.0)
Hemoglobin: 17.3 g/dL — ABNORMAL HIGH (ref 13.0–17.0)
Potassium: 7.2 mmol/L (ref 3.5–5.1)
Sodium: 136 mmol/L (ref 135–145)
TCO2: 26 mmol/L (ref 0–100)

## 2016-03-20 DIAGNOSIS — N2581 Secondary hyperparathyroidism of renal origin: Secondary | ICD-10-CM | POA: Diagnosis not present

## 2016-03-20 DIAGNOSIS — D631 Anemia in chronic kidney disease: Secondary | ICD-10-CM | POA: Diagnosis not present

## 2016-03-20 DIAGNOSIS — Z23 Encounter for immunization: Secondary | ICD-10-CM | POA: Diagnosis not present

## 2016-03-20 DIAGNOSIS — N186 End stage renal disease: Secondary | ICD-10-CM | POA: Diagnosis not present

## 2016-03-21 DIAGNOSIS — Z992 Dependence on renal dialysis: Secondary | ICD-10-CM | POA: Diagnosis not present

## 2016-03-21 DIAGNOSIS — N186 End stage renal disease: Secondary | ICD-10-CM | POA: Diagnosis not present

## 2016-03-23 DIAGNOSIS — N186 End stage renal disease: Secondary | ICD-10-CM | POA: Diagnosis not present

## 2016-03-23 DIAGNOSIS — N2581 Secondary hyperparathyroidism of renal origin: Secondary | ICD-10-CM | POA: Diagnosis not present

## 2016-03-23 DIAGNOSIS — D631 Anemia in chronic kidney disease: Secondary | ICD-10-CM | POA: Diagnosis not present

## 2016-03-25 DIAGNOSIS — Z4822 Encounter for aftercare following kidney transplant: Secondary | ICD-10-CM | POA: Diagnosis not present

## 2016-03-25 DIAGNOSIS — N186 End stage renal disease: Secondary | ICD-10-CM | POA: Diagnosis not present

## 2016-03-25 DIAGNOSIS — E785 Hyperlipidemia, unspecified: Secondary | ICD-10-CM | POA: Diagnosis not present

## 2016-03-25 DIAGNOSIS — D631 Anemia in chronic kidney disease: Secondary | ICD-10-CM | POA: Diagnosis not present

## 2016-03-25 DIAGNOSIS — E1139 Type 2 diabetes mellitus with other diabetic ophthalmic complication: Secondary | ICD-10-CM | POA: Diagnosis not present

## 2016-03-25 DIAGNOSIS — N2581 Secondary hyperparathyroidism of renal origin: Secondary | ICD-10-CM | POA: Diagnosis not present

## 2016-03-25 DIAGNOSIS — Z940 Kidney transplant status: Secondary | ICD-10-CM | POA: Diagnosis not present

## 2016-03-25 DIAGNOSIS — M109 Gout, unspecified: Secondary | ICD-10-CM | POA: Diagnosis not present

## 2016-03-26 ENCOUNTER — Ambulatory Visit (INDEPENDENT_AMBULATORY_CARE_PROVIDER_SITE_OTHER): Payer: Medicare Other | Admitting: Ophthalmology

## 2016-03-27 DIAGNOSIS — D631 Anemia in chronic kidney disease: Secondary | ICD-10-CM | POA: Diagnosis not present

## 2016-03-27 DIAGNOSIS — N2581 Secondary hyperparathyroidism of renal origin: Secondary | ICD-10-CM | POA: Diagnosis not present

## 2016-03-27 DIAGNOSIS — N186 End stage renal disease: Secondary | ICD-10-CM | POA: Diagnosis not present

## 2016-03-29 DIAGNOSIS — M1990 Unspecified osteoarthritis, unspecified site: Secondary | ICD-10-CM | POA: Diagnosis not present

## 2016-03-29 DIAGNOSIS — H9190 Unspecified hearing loss, unspecified ear: Secondary | ICD-10-CM | POA: Diagnosis not present

## 2016-03-29 DIAGNOSIS — N186 End stage renal disease: Secondary | ICD-10-CM | POA: Diagnosis not present

## 2016-03-29 DIAGNOSIS — Z79899 Other long term (current) drug therapy: Secondary | ICD-10-CM | POA: Diagnosis not present

## 2016-03-29 DIAGNOSIS — E669 Obesity, unspecified: Secondary | ICD-10-CM | POA: Diagnosis not present

## 2016-03-29 DIAGNOSIS — E1140 Type 2 diabetes mellitus with diabetic neuropathy, unspecified: Secondary | ICD-10-CM | POA: Diagnosis not present

## 2016-03-29 DIAGNOSIS — E875 Hyperkalemia: Secondary | ICD-10-CM | POA: Diagnosis not present

## 2016-03-29 DIAGNOSIS — R55 Syncope and collapse: Secondary | ICD-10-CM | POA: Diagnosis not present

## 2016-03-29 DIAGNOSIS — T8612 Kidney transplant failure: Secondary | ICD-10-CM | POA: Diagnosis not present

## 2016-03-29 DIAGNOSIS — S31605D Unspecified open wound of abdominal wall, periumbilic region with penetration into peritoneal cavity, subsequent encounter: Secondary | ICD-10-CM | POA: Diagnosis not present

## 2016-03-29 DIAGNOSIS — E7800 Pure hypercholesterolemia, unspecified: Secondary | ICD-10-CM | POA: Diagnosis not present

## 2016-03-29 DIAGNOSIS — I5032 Chronic diastolic (congestive) heart failure: Secondary | ICD-10-CM | POA: Diagnosis not present

## 2016-03-30 DIAGNOSIS — D631 Anemia in chronic kidney disease: Secondary | ICD-10-CM | POA: Diagnosis not present

## 2016-03-30 DIAGNOSIS — N2581 Secondary hyperparathyroidism of renal origin: Secondary | ICD-10-CM | POA: Diagnosis not present

## 2016-03-30 DIAGNOSIS — N186 End stage renal disease: Secondary | ICD-10-CM | POA: Diagnosis not present

## 2016-03-31 DIAGNOSIS — E1121 Type 2 diabetes mellitus with diabetic nephropathy: Secondary | ICD-10-CM | POA: Diagnosis not present

## 2016-03-31 DIAGNOSIS — E7800 Pure hypercholesterolemia, unspecified: Secondary | ICD-10-CM | POA: Diagnosis not present

## 2016-03-31 DIAGNOSIS — Z79899 Other long term (current) drug therapy: Secondary | ICD-10-CM | POA: Diagnosis not present

## 2016-04-01 DIAGNOSIS — N186 End stage renal disease: Secondary | ICD-10-CM | POA: Diagnosis not present

## 2016-04-01 DIAGNOSIS — N2581 Secondary hyperparathyroidism of renal origin: Secondary | ICD-10-CM | POA: Diagnosis not present

## 2016-04-01 DIAGNOSIS — D631 Anemia in chronic kidney disease: Secondary | ICD-10-CM | POA: Diagnosis not present

## 2016-04-03 DIAGNOSIS — N186 End stage renal disease: Secondary | ICD-10-CM | POA: Diagnosis not present

## 2016-04-03 DIAGNOSIS — N2581 Secondary hyperparathyroidism of renal origin: Secondary | ICD-10-CM | POA: Diagnosis not present

## 2016-04-03 DIAGNOSIS — D631 Anemia in chronic kidney disease: Secondary | ICD-10-CM | POA: Diagnosis not present

## 2016-04-06 DIAGNOSIS — N2581 Secondary hyperparathyroidism of renal origin: Secondary | ICD-10-CM | POA: Diagnosis not present

## 2016-04-06 DIAGNOSIS — D631 Anemia in chronic kidney disease: Secondary | ICD-10-CM | POA: Diagnosis not present

## 2016-04-06 DIAGNOSIS — N186 End stage renal disease: Secondary | ICD-10-CM | POA: Diagnosis not present

## 2016-04-10 DIAGNOSIS — D631 Anemia in chronic kidney disease: Secondary | ICD-10-CM | POA: Diagnosis not present

## 2016-04-10 DIAGNOSIS — N2581 Secondary hyperparathyroidism of renal origin: Secondary | ICD-10-CM | POA: Diagnosis not present

## 2016-04-10 DIAGNOSIS — N186 End stage renal disease: Secondary | ICD-10-CM | POA: Diagnosis not present

## 2016-04-13 DIAGNOSIS — N2581 Secondary hyperparathyroidism of renal origin: Secondary | ICD-10-CM | POA: Diagnosis not present

## 2016-04-13 DIAGNOSIS — N186 End stage renal disease: Secondary | ICD-10-CM | POA: Diagnosis not present

## 2016-04-13 DIAGNOSIS — D631 Anemia in chronic kidney disease: Secondary | ICD-10-CM | POA: Diagnosis not present

## 2016-04-15 DIAGNOSIS — N186 End stage renal disease: Secondary | ICD-10-CM | POA: Diagnosis not present

## 2016-04-15 DIAGNOSIS — N2581 Secondary hyperparathyroidism of renal origin: Secondary | ICD-10-CM | POA: Diagnosis not present

## 2016-04-15 DIAGNOSIS — E119 Type 2 diabetes mellitus without complications: Secondary | ICD-10-CM | POA: Diagnosis not present

## 2016-04-15 DIAGNOSIS — D631 Anemia in chronic kidney disease: Secondary | ICD-10-CM | POA: Diagnosis not present

## 2016-04-17 DIAGNOSIS — N2581 Secondary hyperparathyroidism of renal origin: Secondary | ICD-10-CM | POA: Diagnosis not present

## 2016-04-17 DIAGNOSIS — D631 Anemia in chronic kidney disease: Secondary | ICD-10-CM | POA: Diagnosis not present

## 2016-04-17 DIAGNOSIS — N186 End stage renal disease: Secondary | ICD-10-CM | POA: Diagnosis not present

## 2016-04-20 DIAGNOSIS — D631 Anemia in chronic kidney disease: Secondary | ICD-10-CM | POA: Diagnosis not present

## 2016-04-20 DIAGNOSIS — N2581 Secondary hyperparathyroidism of renal origin: Secondary | ICD-10-CM | POA: Diagnosis not present

## 2016-04-20 DIAGNOSIS — N186 End stage renal disease: Secondary | ICD-10-CM | POA: Diagnosis not present

## 2016-04-21 DIAGNOSIS — N186 End stage renal disease: Secondary | ICD-10-CM | POA: Diagnosis not present

## 2016-04-21 DIAGNOSIS — Z992 Dependence on renal dialysis: Secondary | ICD-10-CM | POA: Diagnosis not present

## 2016-04-22 DIAGNOSIS — D631 Anemia in chronic kidney disease: Secondary | ICD-10-CM | POA: Diagnosis not present

## 2016-04-22 DIAGNOSIS — N2581 Secondary hyperparathyroidism of renal origin: Secondary | ICD-10-CM | POA: Diagnosis not present

## 2016-04-22 DIAGNOSIS — N186 End stage renal disease: Secondary | ICD-10-CM | POA: Diagnosis not present

## 2016-04-24 DIAGNOSIS — D631 Anemia in chronic kidney disease: Secondary | ICD-10-CM | POA: Diagnosis not present

## 2016-04-24 DIAGNOSIS — N2581 Secondary hyperparathyroidism of renal origin: Secondary | ICD-10-CM | POA: Diagnosis not present

## 2016-04-24 DIAGNOSIS — N186 End stage renal disease: Secondary | ICD-10-CM | POA: Diagnosis not present

## 2016-04-27 DIAGNOSIS — N2581 Secondary hyperparathyroidism of renal origin: Secondary | ICD-10-CM | POA: Diagnosis not present

## 2016-04-27 DIAGNOSIS — D631 Anemia in chronic kidney disease: Secondary | ICD-10-CM | POA: Diagnosis not present

## 2016-04-27 DIAGNOSIS — N186 End stage renal disease: Secondary | ICD-10-CM | POA: Diagnosis not present

## 2016-04-29 DIAGNOSIS — N186 End stage renal disease: Secondary | ICD-10-CM | POA: Diagnosis not present

## 2016-04-29 DIAGNOSIS — N2581 Secondary hyperparathyroidism of renal origin: Secondary | ICD-10-CM | POA: Diagnosis not present

## 2016-04-29 DIAGNOSIS — E119 Type 2 diabetes mellitus without complications: Secondary | ICD-10-CM | POA: Diagnosis not present

## 2016-04-29 DIAGNOSIS — D631 Anemia in chronic kidney disease: Secondary | ICD-10-CM | POA: Diagnosis not present

## 2016-04-29 DIAGNOSIS — Z114 Encounter for screening for human immunodeficiency virus [HIV]: Secondary | ICD-10-CM | POA: Diagnosis not present

## 2016-05-01 DIAGNOSIS — N2581 Secondary hyperparathyroidism of renal origin: Secondary | ICD-10-CM | POA: Diagnosis not present

## 2016-05-01 DIAGNOSIS — N186 End stage renal disease: Secondary | ICD-10-CM | POA: Diagnosis not present

## 2016-05-01 DIAGNOSIS — D631 Anemia in chronic kidney disease: Secondary | ICD-10-CM | POA: Diagnosis not present

## 2016-05-04 DIAGNOSIS — D631 Anemia in chronic kidney disease: Secondary | ICD-10-CM | POA: Diagnosis not present

## 2016-05-04 DIAGNOSIS — N2581 Secondary hyperparathyroidism of renal origin: Secondary | ICD-10-CM | POA: Diagnosis not present

## 2016-05-04 DIAGNOSIS — N186 End stage renal disease: Secondary | ICD-10-CM | POA: Diagnosis not present

## 2016-05-06 DIAGNOSIS — N186 End stage renal disease: Secondary | ICD-10-CM | POA: Diagnosis not present

## 2016-05-06 DIAGNOSIS — D631 Anemia in chronic kidney disease: Secondary | ICD-10-CM | POA: Diagnosis not present

## 2016-05-06 DIAGNOSIS — N2581 Secondary hyperparathyroidism of renal origin: Secondary | ICD-10-CM | POA: Diagnosis not present

## 2016-05-08 DIAGNOSIS — N186 End stage renal disease: Secondary | ICD-10-CM | POA: Diagnosis not present

## 2016-05-08 DIAGNOSIS — D631 Anemia in chronic kidney disease: Secondary | ICD-10-CM | POA: Diagnosis not present

## 2016-05-08 DIAGNOSIS — N2581 Secondary hyperparathyroidism of renal origin: Secondary | ICD-10-CM | POA: Diagnosis not present

## 2016-05-11 DIAGNOSIS — N2581 Secondary hyperparathyroidism of renal origin: Secondary | ICD-10-CM | POA: Diagnosis not present

## 2016-05-11 DIAGNOSIS — D631 Anemia in chronic kidney disease: Secondary | ICD-10-CM | POA: Diagnosis not present

## 2016-05-11 DIAGNOSIS — N186 End stage renal disease: Secondary | ICD-10-CM | POA: Diagnosis not present

## 2016-05-13 DIAGNOSIS — D631 Anemia in chronic kidney disease: Secondary | ICD-10-CM | POA: Diagnosis not present

## 2016-05-13 DIAGNOSIS — N2581 Secondary hyperparathyroidism of renal origin: Secondary | ICD-10-CM | POA: Diagnosis not present

## 2016-05-13 DIAGNOSIS — N186 End stage renal disease: Secondary | ICD-10-CM | POA: Diagnosis not present

## 2016-05-15 DIAGNOSIS — N2581 Secondary hyperparathyroidism of renal origin: Secondary | ICD-10-CM | POA: Diagnosis not present

## 2016-05-15 DIAGNOSIS — D631 Anemia in chronic kidney disease: Secondary | ICD-10-CM | POA: Diagnosis not present

## 2016-05-15 DIAGNOSIS — N186 End stage renal disease: Secondary | ICD-10-CM | POA: Diagnosis not present

## 2016-05-18 DIAGNOSIS — N2581 Secondary hyperparathyroidism of renal origin: Secondary | ICD-10-CM | POA: Diagnosis not present

## 2016-05-18 DIAGNOSIS — D631 Anemia in chronic kidney disease: Secondary | ICD-10-CM | POA: Diagnosis not present

## 2016-05-18 DIAGNOSIS — N186 End stage renal disease: Secondary | ICD-10-CM | POA: Diagnosis not present

## 2016-05-19 DIAGNOSIS — N186 End stage renal disease: Secondary | ICD-10-CM | POA: Diagnosis not present

## 2016-05-19 DIAGNOSIS — Z992 Dependence on renal dialysis: Secondary | ICD-10-CM | POA: Diagnosis not present

## 2016-05-20 DIAGNOSIS — D6869 Other thrombophilia: Secondary | ICD-10-CM | POA: Diagnosis not present

## 2016-05-20 DIAGNOSIS — N2581 Secondary hyperparathyroidism of renal origin: Secondary | ICD-10-CM | POA: Diagnosis not present

## 2016-05-20 DIAGNOSIS — Z23 Encounter for immunization: Secondary | ICD-10-CM | POA: Diagnosis not present

## 2016-05-20 DIAGNOSIS — D631 Anemia in chronic kidney disease: Secondary | ICD-10-CM | POA: Diagnosis not present

## 2016-05-20 DIAGNOSIS — N186 End stage renal disease: Secondary | ICD-10-CM | POA: Diagnosis not present

## 2016-05-22 DIAGNOSIS — D6869 Other thrombophilia: Secondary | ICD-10-CM | POA: Diagnosis not present

## 2016-05-22 DIAGNOSIS — N2581 Secondary hyperparathyroidism of renal origin: Secondary | ICD-10-CM | POA: Diagnosis not present

## 2016-05-22 DIAGNOSIS — D631 Anemia in chronic kidney disease: Secondary | ICD-10-CM | POA: Diagnosis not present

## 2016-05-22 DIAGNOSIS — N186 End stage renal disease: Secondary | ICD-10-CM | POA: Diagnosis not present

## 2016-05-22 DIAGNOSIS — Z23 Encounter for immunization: Secondary | ICD-10-CM | POA: Diagnosis not present

## 2016-05-25 DIAGNOSIS — N186 End stage renal disease: Secondary | ICD-10-CM | POA: Diagnosis not present

## 2016-05-25 DIAGNOSIS — D6869 Other thrombophilia: Secondary | ICD-10-CM | POA: Diagnosis not present

## 2016-05-25 DIAGNOSIS — D631 Anemia in chronic kidney disease: Secondary | ICD-10-CM | POA: Diagnosis not present

## 2016-05-25 DIAGNOSIS — N2581 Secondary hyperparathyroidism of renal origin: Secondary | ICD-10-CM | POA: Diagnosis not present

## 2016-05-25 DIAGNOSIS — Z23 Encounter for immunization: Secondary | ICD-10-CM | POA: Diagnosis not present

## 2016-05-27 DIAGNOSIS — D6869 Other thrombophilia: Secondary | ICD-10-CM | POA: Diagnosis not present

## 2016-05-27 DIAGNOSIS — E119 Type 2 diabetes mellitus without complications: Secondary | ICD-10-CM | POA: Diagnosis not present

## 2016-05-27 DIAGNOSIS — D631 Anemia in chronic kidney disease: Secondary | ICD-10-CM | POA: Diagnosis not present

## 2016-05-27 DIAGNOSIS — N186 End stage renal disease: Secondary | ICD-10-CM | POA: Diagnosis not present

## 2016-05-27 DIAGNOSIS — Z23 Encounter for immunization: Secondary | ICD-10-CM | POA: Diagnosis not present

## 2016-05-27 DIAGNOSIS — N2581 Secondary hyperparathyroidism of renal origin: Secondary | ICD-10-CM | POA: Diagnosis not present

## 2016-05-29 DIAGNOSIS — D6869 Other thrombophilia: Secondary | ICD-10-CM | POA: Diagnosis not present

## 2016-05-29 DIAGNOSIS — N186 End stage renal disease: Secondary | ICD-10-CM | POA: Diagnosis not present

## 2016-05-29 DIAGNOSIS — N2581 Secondary hyperparathyroidism of renal origin: Secondary | ICD-10-CM | POA: Diagnosis not present

## 2016-05-29 DIAGNOSIS — Z23 Encounter for immunization: Secondary | ICD-10-CM | POA: Diagnosis not present

## 2016-05-29 DIAGNOSIS — D631 Anemia in chronic kidney disease: Secondary | ICD-10-CM | POA: Diagnosis not present

## 2016-06-01 DIAGNOSIS — N186 End stage renal disease: Secondary | ICD-10-CM | POA: Diagnosis not present

## 2016-06-01 DIAGNOSIS — D631 Anemia in chronic kidney disease: Secondary | ICD-10-CM | POA: Diagnosis not present

## 2016-06-01 DIAGNOSIS — Z23 Encounter for immunization: Secondary | ICD-10-CM | POA: Diagnosis not present

## 2016-06-01 DIAGNOSIS — D6869 Other thrombophilia: Secondary | ICD-10-CM | POA: Diagnosis not present

## 2016-06-01 DIAGNOSIS — N2581 Secondary hyperparathyroidism of renal origin: Secondary | ICD-10-CM | POA: Diagnosis not present

## 2016-06-03 DIAGNOSIS — N2581 Secondary hyperparathyroidism of renal origin: Secondary | ICD-10-CM | POA: Diagnosis not present

## 2016-06-03 DIAGNOSIS — D6869 Other thrombophilia: Secondary | ICD-10-CM | POA: Diagnosis not present

## 2016-06-03 DIAGNOSIS — D631 Anemia in chronic kidney disease: Secondary | ICD-10-CM | POA: Diagnosis not present

## 2016-06-03 DIAGNOSIS — N186 End stage renal disease: Secondary | ICD-10-CM | POA: Diagnosis not present

## 2016-06-03 DIAGNOSIS — Z23 Encounter for immunization: Secondary | ICD-10-CM | POA: Diagnosis not present

## 2016-06-04 DIAGNOSIS — I823 Embolism and thrombosis of renal vein: Secondary | ICD-10-CM | POA: Diagnosis not present

## 2016-06-04 DIAGNOSIS — Z940 Kidney transplant status: Secondary | ICD-10-CM | POA: Insufficient documentation

## 2016-06-04 DIAGNOSIS — N186 End stage renal disease: Secondary | ICD-10-CM | POA: Diagnosis not present

## 2016-06-04 DIAGNOSIS — I950 Idiopathic hypotension: Secondary | ICD-10-CM | POA: Diagnosis not present

## 2016-06-04 DIAGNOSIS — Z794 Long term (current) use of insulin: Secondary | ICD-10-CM | POA: Diagnosis not present

## 2016-06-04 DIAGNOSIS — T8619 Other complication of kidney transplant: Secondary | ICD-10-CM | POA: Diagnosis not present

## 2016-06-04 DIAGNOSIS — Z6841 Body Mass Index (BMI) 40.0 and over, adult: Secondary | ICD-10-CM | POA: Diagnosis not present

## 2016-06-04 DIAGNOSIS — Z01818 Encounter for other preprocedural examination: Secondary | ICD-10-CM | POA: Diagnosis not present

## 2016-06-04 DIAGNOSIS — Z992 Dependence on renal dialysis: Secondary | ICD-10-CM | POA: Diagnosis not present

## 2016-06-04 DIAGNOSIS — H3322 Serous retinal detachment, left eye: Secondary | ICD-10-CM | POA: Diagnosis not present

## 2016-06-04 DIAGNOSIS — Z7682 Awaiting organ transplant status: Secondary | ICD-10-CM | POA: Diagnosis not present

## 2016-06-04 DIAGNOSIS — E0822 Diabetes mellitus due to underlying condition with diabetic chronic kidney disease: Secondary | ICD-10-CM | POA: Diagnosis not present

## 2016-06-05 DIAGNOSIS — D6869 Other thrombophilia: Secondary | ICD-10-CM | POA: Diagnosis not present

## 2016-06-05 DIAGNOSIS — Z23 Encounter for immunization: Secondary | ICD-10-CM | POA: Diagnosis not present

## 2016-06-05 DIAGNOSIS — D631 Anemia in chronic kidney disease: Secondary | ICD-10-CM | POA: Diagnosis not present

## 2016-06-05 DIAGNOSIS — N2581 Secondary hyperparathyroidism of renal origin: Secondary | ICD-10-CM | POA: Diagnosis not present

## 2016-06-05 DIAGNOSIS — N186 End stage renal disease: Secondary | ICD-10-CM | POA: Diagnosis not present

## 2016-06-08 DIAGNOSIS — Z23 Encounter for immunization: Secondary | ICD-10-CM | POA: Diagnosis not present

## 2016-06-08 DIAGNOSIS — D6869 Other thrombophilia: Secondary | ICD-10-CM | POA: Diagnosis not present

## 2016-06-08 DIAGNOSIS — N2581 Secondary hyperparathyroidism of renal origin: Secondary | ICD-10-CM | POA: Diagnosis not present

## 2016-06-08 DIAGNOSIS — N186 End stage renal disease: Secondary | ICD-10-CM | POA: Diagnosis not present

## 2016-06-08 DIAGNOSIS — D631 Anemia in chronic kidney disease: Secondary | ICD-10-CM | POA: Diagnosis not present

## 2016-06-09 DIAGNOSIS — R160 Hepatomegaly, not elsewhere classified: Secondary | ICD-10-CM | POA: Diagnosis not present

## 2016-06-10 DIAGNOSIS — N2581 Secondary hyperparathyroidism of renal origin: Secondary | ICD-10-CM | POA: Diagnosis not present

## 2016-06-10 DIAGNOSIS — D6869 Other thrombophilia: Secondary | ICD-10-CM | POA: Diagnosis not present

## 2016-06-10 DIAGNOSIS — N186 End stage renal disease: Secondary | ICD-10-CM | POA: Diagnosis not present

## 2016-06-10 DIAGNOSIS — D631 Anemia in chronic kidney disease: Secondary | ICD-10-CM | POA: Diagnosis not present

## 2016-06-10 DIAGNOSIS — Z23 Encounter for immunization: Secondary | ICD-10-CM | POA: Diagnosis not present

## 2016-06-12 DIAGNOSIS — D6869 Other thrombophilia: Secondary | ICD-10-CM | POA: Diagnosis not present

## 2016-06-12 DIAGNOSIS — Z23 Encounter for immunization: Secondary | ICD-10-CM | POA: Diagnosis not present

## 2016-06-12 DIAGNOSIS — D631 Anemia in chronic kidney disease: Secondary | ICD-10-CM | POA: Diagnosis not present

## 2016-06-12 DIAGNOSIS — N2581 Secondary hyperparathyroidism of renal origin: Secondary | ICD-10-CM | POA: Diagnosis not present

## 2016-06-12 DIAGNOSIS — N186 End stage renal disease: Secondary | ICD-10-CM | POA: Diagnosis not present

## 2016-06-15 DIAGNOSIS — Z23 Encounter for immunization: Secondary | ICD-10-CM | POA: Diagnosis not present

## 2016-06-15 DIAGNOSIS — D6869 Other thrombophilia: Secondary | ICD-10-CM | POA: Diagnosis not present

## 2016-06-15 DIAGNOSIS — N2581 Secondary hyperparathyroidism of renal origin: Secondary | ICD-10-CM | POA: Diagnosis not present

## 2016-06-15 DIAGNOSIS — N186 End stage renal disease: Secondary | ICD-10-CM | POA: Diagnosis not present

## 2016-06-15 DIAGNOSIS — D631 Anemia in chronic kidney disease: Secondary | ICD-10-CM | POA: Diagnosis not present

## 2016-06-17 DIAGNOSIS — Z1159 Encounter for screening for other viral diseases: Secondary | ICD-10-CM | POA: Diagnosis not present

## 2016-06-17 DIAGNOSIS — N2581 Secondary hyperparathyroidism of renal origin: Secondary | ICD-10-CM | POA: Diagnosis not present

## 2016-06-17 DIAGNOSIS — Z23 Encounter for immunization: Secondary | ICD-10-CM | POA: Diagnosis not present

## 2016-06-17 DIAGNOSIS — D6869 Other thrombophilia: Secondary | ICD-10-CM | POA: Diagnosis not present

## 2016-06-17 DIAGNOSIS — D631 Anemia in chronic kidney disease: Secondary | ICD-10-CM | POA: Diagnosis not present

## 2016-06-17 DIAGNOSIS — N186 End stage renal disease: Secondary | ICD-10-CM | POA: Diagnosis not present

## 2016-06-19 DIAGNOSIS — D6869 Other thrombophilia: Secondary | ICD-10-CM | POA: Diagnosis not present

## 2016-06-19 DIAGNOSIS — Z992 Dependence on renal dialysis: Secondary | ICD-10-CM | POA: Diagnosis not present

## 2016-06-19 DIAGNOSIS — D631 Anemia in chronic kidney disease: Secondary | ICD-10-CM | POA: Diagnosis not present

## 2016-06-19 DIAGNOSIS — N2581 Secondary hyperparathyroidism of renal origin: Secondary | ICD-10-CM | POA: Diagnosis not present

## 2016-06-19 DIAGNOSIS — Z23 Encounter for immunization: Secondary | ICD-10-CM | POA: Diagnosis not present

## 2016-06-19 DIAGNOSIS — N186 End stage renal disease: Secondary | ICD-10-CM | POA: Diagnosis not present

## 2016-06-22 DIAGNOSIS — D631 Anemia in chronic kidney disease: Secondary | ICD-10-CM | POA: Diagnosis not present

## 2016-06-22 DIAGNOSIS — N186 End stage renal disease: Secondary | ICD-10-CM | POA: Diagnosis not present

## 2016-06-22 DIAGNOSIS — N2581 Secondary hyperparathyroidism of renal origin: Secondary | ICD-10-CM | POA: Diagnosis not present

## 2016-06-24 DIAGNOSIS — E785 Hyperlipidemia, unspecified: Secondary | ICD-10-CM | POA: Diagnosis not present

## 2016-06-24 DIAGNOSIS — D631 Anemia in chronic kidney disease: Secondary | ICD-10-CM | POA: Diagnosis not present

## 2016-06-24 DIAGNOSIS — N2581 Secondary hyperparathyroidism of renal origin: Secondary | ICD-10-CM | POA: Diagnosis not present

## 2016-06-24 DIAGNOSIS — E119 Type 2 diabetes mellitus without complications: Secondary | ICD-10-CM | POA: Diagnosis not present

## 2016-06-24 DIAGNOSIS — N186 End stage renal disease: Secondary | ICD-10-CM | POA: Diagnosis not present

## 2016-06-24 DIAGNOSIS — M109 Gout, unspecified: Secondary | ICD-10-CM | POA: Diagnosis not present

## 2016-06-26 DIAGNOSIS — N2581 Secondary hyperparathyroidism of renal origin: Secondary | ICD-10-CM | POA: Diagnosis not present

## 2016-06-26 DIAGNOSIS — D631 Anemia in chronic kidney disease: Secondary | ICD-10-CM | POA: Diagnosis not present

## 2016-06-26 DIAGNOSIS — N186 End stage renal disease: Secondary | ICD-10-CM | POA: Diagnosis not present

## 2016-06-29 DIAGNOSIS — N2581 Secondary hyperparathyroidism of renal origin: Secondary | ICD-10-CM | POA: Diagnosis not present

## 2016-06-29 DIAGNOSIS — N186 End stage renal disease: Secondary | ICD-10-CM | POA: Diagnosis not present

## 2016-06-29 DIAGNOSIS — D631 Anemia in chronic kidney disease: Secondary | ICD-10-CM | POA: Diagnosis not present

## 2016-07-01 DIAGNOSIS — D631 Anemia in chronic kidney disease: Secondary | ICD-10-CM | POA: Diagnosis not present

## 2016-07-01 DIAGNOSIS — N186 End stage renal disease: Secondary | ICD-10-CM | POA: Diagnosis not present

## 2016-07-01 DIAGNOSIS — N2581 Secondary hyperparathyroidism of renal origin: Secondary | ICD-10-CM | POA: Diagnosis not present

## 2016-07-03 DIAGNOSIS — D631 Anemia in chronic kidney disease: Secondary | ICD-10-CM | POA: Diagnosis not present

## 2016-07-03 DIAGNOSIS — N2581 Secondary hyperparathyroidism of renal origin: Secondary | ICD-10-CM | POA: Diagnosis not present

## 2016-07-03 DIAGNOSIS — N186 End stage renal disease: Secondary | ICD-10-CM | POA: Diagnosis not present

## 2016-07-06 DIAGNOSIS — D631 Anemia in chronic kidney disease: Secondary | ICD-10-CM | POA: Diagnosis not present

## 2016-07-06 DIAGNOSIS — N2581 Secondary hyperparathyroidism of renal origin: Secondary | ICD-10-CM | POA: Diagnosis not present

## 2016-07-06 DIAGNOSIS — N186 End stage renal disease: Secondary | ICD-10-CM | POA: Diagnosis not present

## 2016-07-08 DIAGNOSIS — D631 Anemia in chronic kidney disease: Secondary | ICD-10-CM | POA: Diagnosis not present

## 2016-07-08 DIAGNOSIS — N2581 Secondary hyperparathyroidism of renal origin: Secondary | ICD-10-CM | POA: Diagnosis not present

## 2016-07-08 DIAGNOSIS — N186 End stage renal disease: Secondary | ICD-10-CM | POA: Diagnosis not present

## 2016-07-10 DIAGNOSIS — D631 Anemia in chronic kidney disease: Secondary | ICD-10-CM | POA: Diagnosis not present

## 2016-07-10 DIAGNOSIS — N186 End stage renal disease: Secondary | ICD-10-CM | POA: Diagnosis not present

## 2016-07-10 DIAGNOSIS — N2581 Secondary hyperparathyroidism of renal origin: Secondary | ICD-10-CM | POA: Diagnosis not present

## 2016-07-13 DIAGNOSIS — N2581 Secondary hyperparathyroidism of renal origin: Secondary | ICD-10-CM | POA: Diagnosis not present

## 2016-07-13 DIAGNOSIS — D631 Anemia in chronic kidney disease: Secondary | ICD-10-CM | POA: Diagnosis not present

## 2016-07-13 DIAGNOSIS — N186 End stage renal disease: Secondary | ICD-10-CM | POA: Diagnosis not present

## 2016-07-15 DIAGNOSIS — N186 End stage renal disease: Secondary | ICD-10-CM | POA: Diagnosis not present

## 2016-07-15 DIAGNOSIS — D631 Anemia in chronic kidney disease: Secondary | ICD-10-CM | POA: Diagnosis not present

## 2016-07-15 DIAGNOSIS — N2581 Secondary hyperparathyroidism of renal origin: Secondary | ICD-10-CM | POA: Diagnosis not present

## 2016-07-17 DIAGNOSIS — N2581 Secondary hyperparathyroidism of renal origin: Secondary | ICD-10-CM | POA: Diagnosis not present

## 2016-07-17 DIAGNOSIS — D631 Anemia in chronic kidney disease: Secondary | ICD-10-CM | POA: Diagnosis not present

## 2016-07-17 DIAGNOSIS — N186 End stage renal disease: Secondary | ICD-10-CM | POA: Diagnosis not present

## 2016-07-19 DIAGNOSIS — Z992 Dependence on renal dialysis: Secondary | ICD-10-CM | POA: Diagnosis not present

## 2016-07-19 DIAGNOSIS — N186 End stage renal disease: Secondary | ICD-10-CM | POA: Diagnosis not present

## 2016-07-20 DIAGNOSIS — D631 Anemia in chronic kidney disease: Secondary | ICD-10-CM | POA: Diagnosis not present

## 2016-07-20 DIAGNOSIS — N2581 Secondary hyperparathyroidism of renal origin: Secondary | ICD-10-CM | POA: Diagnosis not present

## 2016-07-20 DIAGNOSIS — N186 End stage renal disease: Secondary | ICD-10-CM | POA: Diagnosis not present

## 2016-07-21 DIAGNOSIS — H2513 Age-related nuclear cataract, bilateral: Secondary | ICD-10-CM | POA: Diagnosis not present

## 2016-07-21 DIAGNOSIS — H16223 Keratoconjunctivitis sicca, not specified as Sjogren's, bilateral: Secondary | ICD-10-CM | POA: Diagnosis not present

## 2016-07-21 DIAGNOSIS — H401111 Primary open-angle glaucoma, right eye, mild stage: Secondary | ICD-10-CM | POA: Diagnosis not present

## 2016-07-22 DIAGNOSIS — N186 End stage renal disease: Secondary | ICD-10-CM | POA: Diagnosis not present

## 2016-07-22 DIAGNOSIS — E119 Type 2 diabetes mellitus without complications: Secondary | ICD-10-CM | POA: Diagnosis not present

## 2016-07-22 DIAGNOSIS — D631 Anemia in chronic kidney disease: Secondary | ICD-10-CM | POA: Diagnosis not present

## 2016-07-22 DIAGNOSIS — N2581 Secondary hyperparathyroidism of renal origin: Secondary | ICD-10-CM | POA: Diagnosis not present

## 2016-07-24 DIAGNOSIS — N2581 Secondary hyperparathyroidism of renal origin: Secondary | ICD-10-CM | POA: Diagnosis not present

## 2016-07-24 DIAGNOSIS — N186 End stage renal disease: Secondary | ICD-10-CM | POA: Diagnosis not present

## 2016-07-24 DIAGNOSIS — D631 Anemia in chronic kidney disease: Secondary | ICD-10-CM | POA: Diagnosis not present

## 2016-07-27 DIAGNOSIS — N186 End stage renal disease: Secondary | ICD-10-CM | POA: Diagnosis not present

## 2016-07-27 DIAGNOSIS — D631 Anemia in chronic kidney disease: Secondary | ICD-10-CM | POA: Diagnosis not present

## 2016-07-27 DIAGNOSIS — N2581 Secondary hyperparathyroidism of renal origin: Secondary | ICD-10-CM | POA: Diagnosis not present

## 2016-07-28 DIAGNOSIS — G609 Hereditary and idiopathic neuropathy, unspecified: Secondary | ICD-10-CM | POA: Diagnosis not present

## 2016-07-28 DIAGNOSIS — E1165 Type 2 diabetes mellitus with hyperglycemia: Secondary | ICD-10-CM | POA: Diagnosis not present

## 2016-07-28 DIAGNOSIS — N186 End stage renal disease: Secondary | ICD-10-CM | POA: Diagnosis not present

## 2016-07-28 DIAGNOSIS — I10 Essential (primary) hypertension: Secondary | ICD-10-CM | POA: Diagnosis not present

## 2016-07-29 DIAGNOSIS — N186 End stage renal disease: Secondary | ICD-10-CM | POA: Diagnosis not present

## 2016-07-29 DIAGNOSIS — N2581 Secondary hyperparathyroidism of renal origin: Secondary | ICD-10-CM | POA: Diagnosis not present

## 2016-07-29 DIAGNOSIS — D631 Anemia in chronic kidney disease: Secondary | ICD-10-CM | POA: Diagnosis not present

## 2016-07-31 DIAGNOSIS — N186 End stage renal disease: Secondary | ICD-10-CM | POA: Diagnosis not present

## 2016-07-31 DIAGNOSIS — D631 Anemia in chronic kidney disease: Secondary | ICD-10-CM | POA: Diagnosis not present

## 2016-07-31 DIAGNOSIS — N2581 Secondary hyperparathyroidism of renal origin: Secondary | ICD-10-CM | POA: Diagnosis not present

## 2016-08-03 DIAGNOSIS — D631 Anemia in chronic kidney disease: Secondary | ICD-10-CM | POA: Diagnosis not present

## 2016-08-03 DIAGNOSIS — N186 End stage renal disease: Secondary | ICD-10-CM | POA: Diagnosis not present

## 2016-08-03 DIAGNOSIS — N2581 Secondary hyperparathyroidism of renal origin: Secondary | ICD-10-CM | POA: Diagnosis not present

## 2016-08-04 ENCOUNTER — Ambulatory Visit (INDEPENDENT_AMBULATORY_CARE_PROVIDER_SITE_OTHER): Payer: Medicare Other | Admitting: Ophthalmology

## 2016-08-04 DIAGNOSIS — H2513 Age-related nuclear cataract, bilateral: Secondary | ICD-10-CM | POA: Diagnosis not present

## 2016-08-04 DIAGNOSIS — E113591 Type 2 diabetes mellitus with proliferative diabetic retinopathy without macular edema, right eye: Secondary | ICD-10-CM | POA: Diagnosis not present

## 2016-08-04 DIAGNOSIS — E11311 Type 2 diabetes mellitus with unspecified diabetic retinopathy with macular edema: Secondary | ICD-10-CM | POA: Diagnosis not present

## 2016-08-04 DIAGNOSIS — E113522 Type 2 diabetes mellitus with proliferative diabetic retinopathy with traction retinal detachment involving the macula, left eye: Secondary | ICD-10-CM

## 2016-08-04 DIAGNOSIS — H43813 Vitreous degeneration, bilateral: Secondary | ICD-10-CM

## 2016-08-05 DIAGNOSIS — N2581 Secondary hyperparathyroidism of renal origin: Secondary | ICD-10-CM | POA: Diagnosis not present

## 2016-08-05 DIAGNOSIS — D631 Anemia in chronic kidney disease: Secondary | ICD-10-CM | POA: Diagnosis not present

## 2016-08-05 DIAGNOSIS — N186 End stage renal disease: Secondary | ICD-10-CM | POA: Diagnosis not present

## 2016-08-07 DIAGNOSIS — N186 End stage renal disease: Secondary | ICD-10-CM | POA: Diagnosis not present

## 2016-08-07 DIAGNOSIS — D631 Anemia in chronic kidney disease: Secondary | ICD-10-CM | POA: Diagnosis not present

## 2016-08-07 DIAGNOSIS — N2581 Secondary hyperparathyroidism of renal origin: Secondary | ICD-10-CM | POA: Diagnosis not present

## 2016-08-09 DIAGNOSIS — R55 Syncope and collapse: Secondary | ICD-10-CM | POA: Diagnosis not present

## 2016-08-09 DIAGNOSIS — Z114 Encounter for screening for human immunodeficiency virus [HIV]: Secondary | ICD-10-CM | POA: Diagnosis not present

## 2016-08-09 DIAGNOSIS — E7800 Pure hypercholesterolemia, unspecified: Secondary | ICD-10-CM | POA: Diagnosis not present

## 2016-08-09 DIAGNOSIS — L905 Scar conditions and fibrosis of skin: Secondary | ICD-10-CM | POA: Diagnosis not present

## 2016-08-09 DIAGNOSIS — E1140 Type 2 diabetes mellitus with diabetic neuropathy, unspecified: Secondary | ICD-10-CM | POA: Diagnosis not present

## 2016-08-09 DIAGNOSIS — M159 Polyosteoarthritis, unspecified: Secondary | ICD-10-CM | POA: Diagnosis not present

## 2016-08-09 DIAGNOSIS — Z79899 Other long term (current) drug therapy: Secondary | ICD-10-CM | POA: Diagnosis not present

## 2016-08-09 DIAGNOSIS — Z125 Encounter for screening for malignant neoplasm of prostate: Secondary | ICD-10-CM | POA: Diagnosis not present

## 2016-08-09 DIAGNOSIS — E875 Hyperkalemia: Secondary | ICD-10-CM | POA: Diagnosis not present

## 2016-08-09 DIAGNOSIS — N186 End stage renal disease: Secondary | ICD-10-CM | POA: Diagnosis not present

## 2016-08-09 DIAGNOSIS — Z717 Human immunodeficiency virus [HIV] counseling: Secondary | ICD-10-CM | POA: Diagnosis not present

## 2016-08-09 DIAGNOSIS — Z79891 Long term (current) use of opiate analgesic: Secondary | ICD-10-CM | POA: Diagnosis not present

## 2016-08-09 DIAGNOSIS — T8612 Kidney transplant failure: Secondary | ICD-10-CM | POA: Diagnosis not present

## 2016-08-09 DIAGNOSIS — E1040 Type 1 diabetes mellitus with diabetic neuropathy, unspecified: Secondary | ICD-10-CM | POA: Diagnosis not present

## 2016-08-09 DIAGNOSIS — I5030 Unspecified diastolic (congestive) heart failure: Secondary | ICD-10-CM | POA: Diagnosis not present

## 2016-08-10 DIAGNOSIS — D631 Anemia in chronic kidney disease: Secondary | ICD-10-CM | POA: Diagnosis not present

## 2016-08-10 DIAGNOSIS — N2581 Secondary hyperparathyroidism of renal origin: Secondary | ICD-10-CM | POA: Diagnosis not present

## 2016-08-10 DIAGNOSIS — N186 End stage renal disease: Secondary | ICD-10-CM | POA: Diagnosis not present

## 2016-08-12 DIAGNOSIS — D631 Anemia in chronic kidney disease: Secondary | ICD-10-CM | POA: Diagnosis not present

## 2016-08-12 DIAGNOSIS — N2581 Secondary hyperparathyroidism of renal origin: Secondary | ICD-10-CM | POA: Diagnosis not present

## 2016-08-12 DIAGNOSIS — N186 End stage renal disease: Secondary | ICD-10-CM | POA: Diagnosis not present

## 2016-08-14 DIAGNOSIS — N2581 Secondary hyperparathyroidism of renal origin: Secondary | ICD-10-CM | POA: Diagnosis not present

## 2016-08-14 DIAGNOSIS — D631 Anemia in chronic kidney disease: Secondary | ICD-10-CM | POA: Diagnosis not present

## 2016-08-14 DIAGNOSIS — N186 End stage renal disease: Secondary | ICD-10-CM | POA: Diagnosis not present

## 2016-08-17 DIAGNOSIS — N2581 Secondary hyperparathyroidism of renal origin: Secondary | ICD-10-CM | POA: Diagnosis not present

## 2016-08-17 DIAGNOSIS — N186 End stage renal disease: Secondary | ICD-10-CM | POA: Diagnosis not present

## 2016-08-17 DIAGNOSIS — D631 Anemia in chronic kidney disease: Secondary | ICD-10-CM | POA: Diagnosis not present

## 2016-08-19 DIAGNOSIS — N2581 Secondary hyperparathyroidism of renal origin: Secondary | ICD-10-CM | POA: Diagnosis not present

## 2016-08-19 DIAGNOSIS — Z992 Dependence on renal dialysis: Secondary | ICD-10-CM | POA: Diagnosis not present

## 2016-08-19 DIAGNOSIS — N186 End stage renal disease: Secondary | ICD-10-CM | POA: Diagnosis not present

## 2016-08-19 DIAGNOSIS — D631 Anemia in chronic kidney disease: Secondary | ICD-10-CM | POA: Diagnosis not present

## 2016-08-21 DIAGNOSIS — D631 Anemia in chronic kidney disease: Secondary | ICD-10-CM | POA: Diagnosis not present

## 2016-08-21 DIAGNOSIS — N186 End stage renal disease: Secondary | ICD-10-CM | POA: Diagnosis not present

## 2016-08-21 DIAGNOSIS — N2581 Secondary hyperparathyroidism of renal origin: Secondary | ICD-10-CM | POA: Diagnosis not present

## 2016-08-24 DIAGNOSIS — N2581 Secondary hyperparathyroidism of renal origin: Secondary | ICD-10-CM | POA: Diagnosis not present

## 2016-08-24 DIAGNOSIS — N186 End stage renal disease: Secondary | ICD-10-CM | POA: Diagnosis not present

## 2016-08-24 DIAGNOSIS — D631 Anemia in chronic kidney disease: Secondary | ICD-10-CM | POA: Diagnosis not present

## 2016-08-26 DIAGNOSIS — N2581 Secondary hyperparathyroidism of renal origin: Secondary | ICD-10-CM | POA: Diagnosis not present

## 2016-08-26 DIAGNOSIS — E119 Type 2 diabetes mellitus without complications: Secondary | ICD-10-CM | POA: Diagnosis not present

## 2016-08-26 DIAGNOSIS — N186 End stage renal disease: Secondary | ICD-10-CM | POA: Diagnosis not present

## 2016-08-26 DIAGNOSIS — D631 Anemia in chronic kidney disease: Secondary | ICD-10-CM | POA: Diagnosis not present

## 2016-08-28 DIAGNOSIS — N2581 Secondary hyperparathyroidism of renal origin: Secondary | ICD-10-CM | POA: Diagnosis not present

## 2016-08-28 DIAGNOSIS — N186 End stage renal disease: Secondary | ICD-10-CM | POA: Diagnosis not present

## 2016-08-28 DIAGNOSIS — D631 Anemia in chronic kidney disease: Secondary | ICD-10-CM | POA: Diagnosis not present

## 2016-08-31 DIAGNOSIS — N186 End stage renal disease: Secondary | ICD-10-CM | POA: Diagnosis not present

## 2016-08-31 DIAGNOSIS — N2581 Secondary hyperparathyroidism of renal origin: Secondary | ICD-10-CM | POA: Diagnosis not present

## 2016-08-31 DIAGNOSIS — D631 Anemia in chronic kidney disease: Secondary | ICD-10-CM | POA: Diagnosis not present

## 2016-09-02 DIAGNOSIS — N186 End stage renal disease: Secondary | ICD-10-CM | POA: Diagnosis not present

## 2016-09-02 DIAGNOSIS — N2581 Secondary hyperparathyroidism of renal origin: Secondary | ICD-10-CM | POA: Diagnosis not present

## 2016-09-02 DIAGNOSIS — D631 Anemia in chronic kidney disease: Secondary | ICD-10-CM | POA: Diagnosis not present

## 2016-09-04 DIAGNOSIS — N186 End stage renal disease: Secondary | ICD-10-CM | POA: Diagnosis not present

## 2016-09-04 DIAGNOSIS — N2581 Secondary hyperparathyroidism of renal origin: Secondary | ICD-10-CM | POA: Diagnosis not present

## 2016-09-04 DIAGNOSIS — D631 Anemia in chronic kidney disease: Secondary | ICD-10-CM | POA: Diagnosis not present

## 2016-09-07 DIAGNOSIS — D631 Anemia in chronic kidney disease: Secondary | ICD-10-CM | POA: Diagnosis not present

## 2016-09-07 DIAGNOSIS — N2581 Secondary hyperparathyroidism of renal origin: Secondary | ICD-10-CM | POA: Diagnosis not present

## 2016-09-07 DIAGNOSIS — N186 End stage renal disease: Secondary | ICD-10-CM | POA: Diagnosis not present

## 2016-09-08 DIAGNOSIS — I871 Compression of vein: Secondary | ICD-10-CM | POA: Diagnosis not present

## 2016-09-08 DIAGNOSIS — T82590A Other mechanical complication of surgically created arteriovenous fistula, initial encounter: Secondary | ICD-10-CM | POA: Diagnosis not present

## 2016-09-08 DIAGNOSIS — E119 Type 2 diabetes mellitus without complications: Secondary | ICD-10-CM | POA: Diagnosis not present

## 2016-09-09 DIAGNOSIS — D631 Anemia in chronic kidney disease: Secondary | ICD-10-CM | POA: Diagnosis not present

## 2016-09-09 DIAGNOSIS — N186 End stage renal disease: Secondary | ICD-10-CM | POA: Diagnosis not present

## 2016-09-09 DIAGNOSIS — N2581 Secondary hyperparathyroidism of renal origin: Secondary | ICD-10-CM | POA: Diagnosis not present

## 2016-09-11 DIAGNOSIS — N2581 Secondary hyperparathyroidism of renal origin: Secondary | ICD-10-CM | POA: Diagnosis not present

## 2016-09-11 DIAGNOSIS — D631 Anemia in chronic kidney disease: Secondary | ICD-10-CM | POA: Diagnosis not present

## 2016-09-11 DIAGNOSIS — N186 End stage renal disease: Secondary | ICD-10-CM | POA: Diagnosis not present

## 2016-09-14 DIAGNOSIS — D631 Anemia in chronic kidney disease: Secondary | ICD-10-CM | POA: Diagnosis not present

## 2016-09-14 DIAGNOSIS — N186 End stage renal disease: Secondary | ICD-10-CM | POA: Diagnosis not present

## 2016-09-14 DIAGNOSIS — N2581 Secondary hyperparathyroidism of renal origin: Secondary | ICD-10-CM | POA: Diagnosis not present

## 2016-09-16 DIAGNOSIS — N2581 Secondary hyperparathyroidism of renal origin: Secondary | ICD-10-CM | POA: Diagnosis not present

## 2016-09-16 DIAGNOSIS — N186 End stage renal disease: Secondary | ICD-10-CM | POA: Diagnosis not present

## 2016-09-16 DIAGNOSIS — D631 Anemia in chronic kidney disease: Secondary | ICD-10-CM | POA: Diagnosis not present

## 2016-09-18 DIAGNOSIS — N186 End stage renal disease: Secondary | ICD-10-CM | POA: Diagnosis not present

## 2016-09-18 DIAGNOSIS — D631 Anemia in chronic kidney disease: Secondary | ICD-10-CM | POA: Diagnosis not present

## 2016-09-18 DIAGNOSIS — N2581 Secondary hyperparathyroidism of renal origin: Secondary | ICD-10-CM | POA: Diagnosis not present

## 2016-09-18 DIAGNOSIS — Z992 Dependence on renal dialysis: Secondary | ICD-10-CM | POA: Diagnosis not present

## 2016-09-21 DIAGNOSIS — N2581 Secondary hyperparathyroidism of renal origin: Secondary | ICD-10-CM | POA: Diagnosis not present

## 2016-09-21 DIAGNOSIS — D631 Anemia in chronic kidney disease: Secondary | ICD-10-CM | POA: Diagnosis not present

## 2016-09-21 DIAGNOSIS — N186 End stage renal disease: Secondary | ICD-10-CM | POA: Diagnosis not present

## 2016-09-23 DIAGNOSIS — E785 Hyperlipidemia, unspecified: Secondary | ICD-10-CM | POA: Diagnosis not present

## 2016-09-23 DIAGNOSIS — D631 Anemia in chronic kidney disease: Secondary | ICD-10-CM | POA: Diagnosis not present

## 2016-09-23 DIAGNOSIS — M109 Gout, unspecified: Secondary | ICD-10-CM | POA: Diagnosis not present

## 2016-09-23 DIAGNOSIS — N2581 Secondary hyperparathyroidism of renal origin: Secondary | ICD-10-CM | POA: Diagnosis not present

## 2016-09-23 DIAGNOSIS — N186 End stage renal disease: Secondary | ICD-10-CM | POA: Diagnosis not present

## 2016-09-23 DIAGNOSIS — E119 Type 2 diabetes mellitus without complications: Secondary | ICD-10-CM | POA: Diagnosis not present

## 2016-09-25 DIAGNOSIS — N2581 Secondary hyperparathyroidism of renal origin: Secondary | ICD-10-CM | POA: Diagnosis not present

## 2016-09-25 DIAGNOSIS — N186 End stage renal disease: Secondary | ICD-10-CM | POA: Diagnosis not present

## 2016-09-25 DIAGNOSIS — D631 Anemia in chronic kidney disease: Secondary | ICD-10-CM | POA: Diagnosis not present

## 2016-09-28 DIAGNOSIS — N2581 Secondary hyperparathyroidism of renal origin: Secondary | ICD-10-CM | POA: Diagnosis not present

## 2016-09-28 DIAGNOSIS — D631 Anemia in chronic kidney disease: Secondary | ICD-10-CM | POA: Diagnosis not present

## 2016-09-28 DIAGNOSIS — N186 End stage renal disease: Secondary | ICD-10-CM | POA: Diagnosis not present

## 2016-09-30 DIAGNOSIS — D631 Anemia in chronic kidney disease: Secondary | ICD-10-CM | POA: Diagnosis not present

## 2016-09-30 DIAGNOSIS — N186 End stage renal disease: Secondary | ICD-10-CM | POA: Diagnosis not present

## 2016-09-30 DIAGNOSIS — N2581 Secondary hyperparathyroidism of renal origin: Secondary | ICD-10-CM | POA: Diagnosis not present

## 2016-10-02 DIAGNOSIS — D631 Anemia in chronic kidney disease: Secondary | ICD-10-CM | POA: Diagnosis not present

## 2016-10-02 DIAGNOSIS — N2581 Secondary hyperparathyroidism of renal origin: Secondary | ICD-10-CM | POA: Diagnosis not present

## 2016-10-02 DIAGNOSIS — N186 End stage renal disease: Secondary | ICD-10-CM | POA: Diagnosis not present

## 2016-10-05 DIAGNOSIS — N2581 Secondary hyperparathyroidism of renal origin: Secondary | ICD-10-CM | POA: Diagnosis not present

## 2016-10-05 DIAGNOSIS — D631 Anemia in chronic kidney disease: Secondary | ICD-10-CM | POA: Diagnosis not present

## 2016-10-05 DIAGNOSIS — N186 End stage renal disease: Secondary | ICD-10-CM | POA: Diagnosis not present

## 2016-10-07 DIAGNOSIS — N186 End stage renal disease: Secondary | ICD-10-CM | POA: Diagnosis not present

## 2016-10-07 DIAGNOSIS — N2581 Secondary hyperparathyroidism of renal origin: Secondary | ICD-10-CM | POA: Diagnosis not present

## 2016-10-07 DIAGNOSIS — D631 Anemia in chronic kidney disease: Secondary | ICD-10-CM | POA: Diagnosis not present

## 2016-10-09 DIAGNOSIS — N2581 Secondary hyperparathyroidism of renal origin: Secondary | ICD-10-CM | POA: Diagnosis not present

## 2016-10-09 DIAGNOSIS — D631 Anemia in chronic kidney disease: Secondary | ICD-10-CM | POA: Diagnosis not present

## 2016-10-09 DIAGNOSIS — N186 End stage renal disease: Secondary | ICD-10-CM | POA: Diagnosis not present

## 2016-10-12 DIAGNOSIS — N2581 Secondary hyperparathyroidism of renal origin: Secondary | ICD-10-CM | POA: Diagnosis not present

## 2016-10-12 DIAGNOSIS — D631 Anemia in chronic kidney disease: Secondary | ICD-10-CM | POA: Diagnosis not present

## 2016-10-12 DIAGNOSIS — N186 End stage renal disease: Secondary | ICD-10-CM | POA: Diagnosis not present

## 2016-10-14 DIAGNOSIS — N186 End stage renal disease: Secondary | ICD-10-CM | POA: Diagnosis not present

## 2016-10-14 DIAGNOSIS — N2581 Secondary hyperparathyroidism of renal origin: Secondary | ICD-10-CM | POA: Diagnosis not present

## 2016-10-14 DIAGNOSIS — D631 Anemia in chronic kidney disease: Secondary | ICD-10-CM | POA: Diagnosis not present

## 2016-10-16 DIAGNOSIS — D631 Anemia in chronic kidney disease: Secondary | ICD-10-CM | POA: Diagnosis not present

## 2016-10-16 DIAGNOSIS — N2581 Secondary hyperparathyroidism of renal origin: Secondary | ICD-10-CM | POA: Diagnosis not present

## 2016-10-16 DIAGNOSIS — N186 End stage renal disease: Secondary | ICD-10-CM | POA: Diagnosis not present

## 2016-10-19 DIAGNOSIS — N186 End stage renal disease: Secondary | ICD-10-CM | POA: Diagnosis not present

## 2016-10-19 DIAGNOSIS — D631 Anemia in chronic kidney disease: Secondary | ICD-10-CM | POA: Diagnosis not present

## 2016-10-19 DIAGNOSIS — N2581 Secondary hyperparathyroidism of renal origin: Secondary | ICD-10-CM | POA: Diagnosis not present

## 2016-10-19 DIAGNOSIS — Z992 Dependence on renal dialysis: Secondary | ICD-10-CM | POA: Diagnosis not present

## 2016-10-20 DIAGNOSIS — E109 Type 1 diabetes mellitus without complications: Secondary | ICD-10-CM | POA: Diagnosis not present

## 2016-10-20 DIAGNOSIS — N186 End stage renal disease: Secondary | ICD-10-CM | POA: Diagnosis not present

## 2016-10-20 DIAGNOSIS — Z125 Encounter for screening for malignant neoplasm of prostate: Secondary | ICD-10-CM | POA: Diagnosis not present

## 2016-10-20 DIAGNOSIS — Z01818 Encounter for other preprocedural examination: Secondary | ICD-10-CM | POA: Diagnosis not present

## 2016-10-20 DIAGNOSIS — I8290 Acute embolism and thrombosis of unspecified vein: Secondary | ICD-10-CM | POA: Diagnosis not present

## 2016-10-21 DIAGNOSIS — E119 Type 2 diabetes mellitus without complications: Secondary | ICD-10-CM | POA: Diagnosis not present

## 2016-10-21 DIAGNOSIS — N2581 Secondary hyperparathyroidism of renal origin: Secondary | ICD-10-CM | POA: Diagnosis not present

## 2016-10-21 DIAGNOSIS — D631 Anemia in chronic kidney disease: Secondary | ICD-10-CM | POA: Diagnosis not present

## 2016-10-21 DIAGNOSIS — N186 End stage renal disease: Secondary | ICD-10-CM | POA: Diagnosis not present

## 2016-10-23 DIAGNOSIS — N186 End stage renal disease: Secondary | ICD-10-CM | POA: Diagnosis not present

## 2016-10-23 DIAGNOSIS — N2581 Secondary hyperparathyroidism of renal origin: Secondary | ICD-10-CM | POA: Diagnosis not present

## 2016-10-23 DIAGNOSIS — D631 Anemia in chronic kidney disease: Secondary | ICD-10-CM | POA: Diagnosis not present

## 2016-10-26 DIAGNOSIS — N186 End stage renal disease: Secondary | ICD-10-CM | POA: Diagnosis not present

## 2016-10-26 DIAGNOSIS — D631 Anemia in chronic kidney disease: Secondary | ICD-10-CM | POA: Diagnosis not present

## 2016-10-26 DIAGNOSIS — N2581 Secondary hyperparathyroidism of renal origin: Secondary | ICD-10-CM | POA: Diagnosis not present

## 2016-10-28 DIAGNOSIS — N2581 Secondary hyperparathyroidism of renal origin: Secondary | ICD-10-CM | POA: Diagnosis not present

## 2016-10-28 DIAGNOSIS — E1139 Type 2 diabetes mellitus with other diabetic ophthalmic complication: Secondary | ICD-10-CM | POA: Diagnosis not present

## 2016-10-28 DIAGNOSIS — D631 Anemia in chronic kidney disease: Secondary | ICD-10-CM | POA: Diagnosis not present

## 2016-10-28 DIAGNOSIS — N186 End stage renal disease: Secondary | ICD-10-CM | POA: Diagnosis not present

## 2016-10-29 DIAGNOSIS — E1165 Type 2 diabetes mellitus with hyperglycemia: Secondary | ICD-10-CM | POA: Diagnosis not present

## 2016-10-29 DIAGNOSIS — N186 End stage renal disease: Secondary | ICD-10-CM | POA: Diagnosis not present

## 2016-10-30 DIAGNOSIS — D631 Anemia in chronic kidney disease: Secondary | ICD-10-CM | POA: Diagnosis not present

## 2016-10-30 DIAGNOSIS — N2581 Secondary hyperparathyroidism of renal origin: Secondary | ICD-10-CM | POA: Diagnosis not present

## 2016-10-30 DIAGNOSIS — N186 End stage renal disease: Secondary | ICD-10-CM | POA: Diagnosis not present

## 2016-11-02 DIAGNOSIS — D631 Anemia in chronic kidney disease: Secondary | ICD-10-CM | POA: Diagnosis not present

## 2016-11-02 DIAGNOSIS — N2581 Secondary hyperparathyroidism of renal origin: Secondary | ICD-10-CM | POA: Diagnosis not present

## 2016-11-02 DIAGNOSIS — N186 End stage renal disease: Secondary | ICD-10-CM | POA: Diagnosis not present

## 2016-11-04 DIAGNOSIS — D631 Anemia in chronic kidney disease: Secondary | ICD-10-CM | POA: Diagnosis not present

## 2016-11-04 DIAGNOSIS — N186 End stage renal disease: Secondary | ICD-10-CM | POA: Diagnosis not present

## 2016-11-04 DIAGNOSIS — N2581 Secondary hyperparathyroidism of renal origin: Secondary | ICD-10-CM | POA: Diagnosis not present

## 2016-11-06 DIAGNOSIS — N186 End stage renal disease: Secondary | ICD-10-CM | POA: Diagnosis not present

## 2016-11-06 DIAGNOSIS — D631 Anemia in chronic kidney disease: Secondary | ICD-10-CM | POA: Diagnosis not present

## 2016-11-06 DIAGNOSIS — N2581 Secondary hyperparathyroidism of renal origin: Secondary | ICD-10-CM | POA: Diagnosis not present

## 2016-11-09 DIAGNOSIS — D631 Anemia in chronic kidney disease: Secondary | ICD-10-CM | POA: Diagnosis not present

## 2016-11-09 DIAGNOSIS — N2581 Secondary hyperparathyroidism of renal origin: Secondary | ICD-10-CM | POA: Diagnosis not present

## 2016-11-09 DIAGNOSIS — N186 End stage renal disease: Secondary | ICD-10-CM | POA: Diagnosis not present

## 2016-11-11 DIAGNOSIS — N2581 Secondary hyperparathyroidism of renal origin: Secondary | ICD-10-CM | POA: Diagnosis not present

## 2016-11-11 DIAGNOSIS — D631 Anemia in chronic kidney disease: Secondary | ICD-10-CM | POA: Diagnosis not present

## 2016-11-11 DIAGNOSIS — N186 End stage renal disease: Secondary | ICD-10-CM | POA: Diagnosis not present

## 2016-11-12 ENCOUNTER — Encounter: Payer: Self-pay | Admitting: Interventional Cardiology

## 2016-11-13 DIAGNOSIS — N2581 Secondary hyperparathyroidism of renal origin: Secondary | ICD-10-CM | POA: Diagnosis not present

## 2016-11-13 DIAGNOSIS — D631 Anemia in chronic kidney disease: Secondary | ICD-10-CM | POA: Diagnosis not present

## 2016-11-13 DIAGNOSIS — N186 End stage renal disease: Secondary | ICD-10-CM | POA: Diagnosis not present

## 2016-11-16 DIAGNOSIS — N186 End stage renal disease: Secondary | ICD-10-CM | POA: Diagnosis not present

## 2016-11-16 DIAGNOSIS — D631 Anemia in chronic kidney disease: Secondary | ICD-10-CM | POA: Diagnosis not present

## 2016-11-16 DIAGNOSIS — N2581 Secondary hyperparathyroidism of renal origin: Secondary | ICD-10-CM | POA: Diagnosis not present

## 2016-11-18 DIAGNOSIS — N186 End stage renal disease: Secondary | ICD-10-CM | POA: Diagnosis not present

## 2016-11-18 DIAGNOSIS — N2581 Secondary hyperparathyroidism of renal origin: Secondary | ICD-10-CM | POA: Diagnosis not present

## 2016-11-18 DIAGNOSIS — D631 Anemia in chronic kidney disease: Secondary | ICD-10-CM | POA: Diagnosis not present

## 2016-11-19 DIAGNOSIS — N186 End stage renal disease: Secondary | ICD-10-CM | POA: Diagnosis not present

## 2016-11-19 DIAGNOSIS — Z992 Dependence on renal dialysis: Secondary | ICD-10-CM | POA: Diagnosis not present

## 2016-11-20 DIAGNOSIS — N2581 Secondary hyperparathyroidism of renal origin: Secondary | ICD-10-CM | POA: Diagnosis not present

## 2016-11-20 DIAGNOSIS — N186 End stage renal disease: Secondary | ICD-10-CM | POA: Diagnosis not present

## 2016-11-20 DIAGNOSIS — D631 Anemia in chronic kidney disease: Secondary | ICD-10-CM | POA: Diagnosis not present

## 2016-11-23 DIAGNOSIS — D631 Anemia in chronic kidney disease: Secondary | ICD-10-CM | POA: Diagnosis not present

## 2016-11-23 DIAGNOSIS — N186 End stage renal disease: Secondary | ICD-10-CM | POA: Diagnosis not present

## 2016-11-23 DIAGNOSIS — N2581 Secondary hyperparathyroidism of renal origin: Secondary | ICD-10-CM | POA: Diagnosis not present

## 2016-11-24 DIAGNOSIS — E113593 Type 2 diabetes mellitus with proliferative diabetic retinopathy without macular edema, bilateral: Secondary | ICD-10-CM | POA: Diagnosis not present

## 2016-11-24 DIAGNOSIS — H33012 Retinal detachment with single break, left eye: Secondary | ICD-10-CM | POA: Diagnosis not present

## 2016-11-24 DIAGNOSIS — H16223 Keratoconjunctivitis sicca, not specified as Sjogren's, bilateral: Secondary | ICD-10-CM | POA: Diagnosis not present

## 2016-11-24 DIAGNOSIS — H401111 Primary open-angle glaucoma, right eye, mild stage: Secondary | ICD-10-CM | POA: Diagnosis not present

## 2016-11-24 DIAGNOSIS — H2513 Age-related nuclear cataract, bilateral: Secondary | ICD-10-CM | POA: Diagnosis not present

## 2016-11-25 DIAGNOSIS — N186 End stage renal disease: Secondary | ICD-10-CM | POA: Diagnosis not present

## 2016-11-25 DIAGNOSIS — N2581 Secondary hyperparathyroidism of renal origin: Secondary | ICD-10-CM | POA: Diagnosis not present

## 2016-11-25 DIAGNOSIS — D631 Anemia in chronic kidney disease: Secondary | ICD-10-CM | POA: Diagnosis not present

## 2016-11-25 DIAGNOSIS — E119 Type 2 diabetes mellitus without complications: Secondary | ICD-10-CM | POA: Diagnosis not present

## 2016-11-27 DIAGNOSIS — N2581 Secondary hyperparathyroidism of renal origin: Secondary | ICD-10-CM | POA: Diagnosis not present

## 2016-11-27 DIAGNOSIS — N186 End stage renal disease: Secondary | ICD-10-CM | POA: Diagnosis not present

## 2016-11-27 DIAGNOSIS — D631 Anemia in chronic kidney disease: Secondary | ICD-10-CM | POA: Diagnosis not present

## 2016-11-30 DIAGNOSIS — N186 End stage renal disease: Secondary | ICD-10-CM | POA: Diagnosis not present

## 2016-11-30 DIAGNOSIS — D631 Anemia in chronic kidney disease: Secondary | ICD-10-CM | POA: Diagnosis not present

## 2016-11-30 DIAGNOSIS — N2581 Secondary hyperparathyroidism of renal origin: Secondary | ICD-10-CM | POA: Diagnosis not present

## 2016-12-02 DIAGNOSIS — N186 End stage renal disease: Secondary | ICD-10-CM | POA: Diagnosis not present

## 2016-12-02 DIAGNOSIS — D631 Anemia in chronic kidney disease: Secondary | ICD-10-CM | POA: Diagnosis not present

## 2016-12-02 DIAGNOSIS — N2581 Secondary hyperparathyroidism of renal origin: Secondary | ICD-10-CM | POA: Diagnosis not present

## 2016-12-03 ENCOUNTER — Ambulatory Visit: Payer: Medicare Other | Admitting: Interventional Cardiology

## 2016-12-04 DIAGNOSIS — N186 End stage renal disease: Secondary | ICD-10-CM | POA: Diagnosis not present

## 2016-12-04 DIAGNOSIS — N2581 Secondary hyperparathyroidism of renal origin: Secondary | ICD-10-CM | POA: Diagnosis not present

## 2016-12-04 DIAGNOSIS — D631 Anemia in chronic kidney disease: Secondary | ICD-10-CM | POA: Diagnosis not present

## 2016-12-07 DIAGNOSIS — N186 End stage renal disease: Secondary | ICD-10-CM | POA: Diagnosis not present

## 2016-12-07 DIAGNOSIS — N2581 Secondary hyperparathyroidism of renal origin: Secondary | ICD-10-CM | POA: Diagnosis not present

## 2016-12-07 DIAGNOSIS — D631 Anemia in chronic kidney disease: Secondary | ICD-10-CM | POA: Diagnosis not present

## 2016-12-09 DIAGNOSIS — N186 End stage renal disease: Secondary | ICD-10-CM | POA: Diagnosis not present

## 2016-12-09 DIAGNOSIS — N2581 Secondary hyperparathyroidism of renal origin: Secondary | ICD-10-CM | POA: Diagnosis not present

## 2016-12-09 DIAGNOSIS — D631 Anemia in chronic kidney disease: Secondary | ICD-10-CM | POA: Diagnosis not present

## 2016-12-11 DIAGNOSIS — N2581 Secondary hyperparathyroidism of renal origin: Secondary | ICD-10-CM | POA: Diagnosis not present

## 2016-12-11 DIAGNOSIS — D631 Anemia in chronic kidney disease: Secondary | ICD-10-CM | POA: Diagnosis not present

## 2016-12-11 DIAGNOSIS — N186 End stage renal disease: Secondary | ICD-10-CM | POA: Diagnosis not present

## 2016-12-14 DIAGNOSIS — N186 End stage renal disease: Secondary | ICD-10-CM | POA: Diagnosis not present

## 2016-12-14 DIAGNOSIS — N2581 Secondary hyperparathyroidism of renal origin: Secondary | ICD-10-CM | POA: Diagnosis not present

## 2016-12-14 DIAGNOSIS — D631 Anemia in chronic kidney disease: Secondary | ICD-10-CM | POA: Diagnosis not present

## 2016-12-16 DIAGNOSIS — N2581 Secondary hyperparathyroidism of renal origin: Secondary | ICD-10-CM | POA: Diagnosis not present

## 2016-12-16 DIAGNOSIS — N186 End stage renal disease: Secondary | ICD-10-CM | POA: Diagnosis not present

## 2016-12-16 DIAGNOSIS — D631 Anemia in chronic kidney disease: Secondary | ICD-10-CM | POA: Diagnosis not present

## 2016-12-18 DIAGNOSIS — N186 End stage renal disease: Secondary | ICD-10-CM | POA: Diagnosis not present

## 2016-12-18 DIAGNOSIS — D631 Anemia in chronic kidney disease: Secondary | ICD-10-CM | POA: Diagnosis not present

## 2016-12-18 DIAGNOSIS — N2581 Secondary hyperparathyroidism of renal origin: Secondary | ICD-10-CM | POA: Diagnosis not present

## 2016-12-19 DIAGNOSIS — Z992 Dependence on renal dialysis: Secondary | ICD-10-CM | POA: Diagnosis not present

## 2016-12-19 DIAGNOSIS — N186 End stage renal disease: Secondary | ICD-10-CM | POA: Diagnosis not present

## 2016-12-21 DIAGNOSIS — N186 End stage renal disease: Secondary | ICD-10-CM | POA: Diagnosis not present

## 2016-12-21 DIAGNOSIS — D631 Anemia in chronic kidney disease: Secondary | ICD-10-CM | POA: Diagnosis not present

## 2016-12-21 DIAGNOSIS — N2581 Secondary hyperparathyroidism of renal origin: Secondary | ICD-10-CM | POA: Diagnosis not present

## 2016-12-23 DIAGNOSIS — E785 Hyperlipidemia, unspecified: Secondary | ICD-10-CM | POA: Diagnosis not present

## 2016-12-23 DIAGNOSIS — N186 End stage renal disease: Secondary | ICD-10-CM | POA: Diagnosis not present

## 2016-12-23 DIAGNOSIS — Z79899 Other long term (current) drug therapy: Secondary | ICD-10-CM | POA: Diagnosis not present

## 2016-12-23 DIAGNOSIS — N2581 Secondary hyperparathyroidism of renal origin: Secondary | ICD-10-CM | POA: Diagnosis not present

## 2016-12-23 DIAGNOSIS — E119 Type 2 diabetes mellitus without complications: Secondary | ICD-10-CM | POA: Diagnosis not present

## 2016-12-23 DIAGNOSIS — D631 Anemia in chronic kidney disease: Secondary | ICD-10-CM | POA: Diagnosis not present

## 2016-12-23 DIAGNOSIS — M109 Gout, unspecified: Secondary | ICD-10-CM | POA: Diagnosis not present

## 2016-12-25 DIAGNOSIS — N2581 Secondary hyperparathyroidism of renal origin: Secondary | ICD-10-CM | POA: Diagnosis not present

## 2016-12-25 DIAGNOSIS — D631 Anemia in chronic kidney disease: Secondary | ICD-10-CM | POA: Diagnosis not present

## 2016-12-25 DIAGNOSIS — N186 End stage renal disease: Secondary | ICD-10-CM | POA: Diagnosis not present

## 2016-12-27 DIAGNOSIS — E669 Obesity, unspecified: Secondary | ICD-10-CM | POA: Diagnosis not present

## 2016-12-27 DIAGNOSIS — E875 Hyperkalemia: Secondary | ICD-10-CM | POA: Diagnosis not present

## 2016-12-27 DIAGNOSIS — M2550 Pain in unspecified joint: Secondary | ICD-10-CM | POA: Diagnosis not present

## 2016-12-27 DIAGNOSIS — I5032 Chronic diastolic (congestive) heart failure: Secondary | ICD-10-CM | POA: Diagnosis not present

## 2016-12-27 DIAGNOSIS — N186 End stage renal disease: Secondary | ICD-10-CM | POA: Diagnosis not present

## 2016-12-27 DIAGNOSIS — Z23 Encounter for immunization: Secondary | ICD-10-CM | POA: Diagnosis not present

## 2016-12-27 DIAGNOSIS — E1140 Type 2 diabetes mellitus with diabetic neuropathy, unspecified: Secondary | ICD-10-CM | POA: Diagnosis not present

## 2016-12-27 DIAGNOSIS — E7800 Pure hypercholesterolemia, unspecified: Secondary | ICD-10-CM | POA: Diagnosis not present

## 2016-12-27 DIAGNOSIS — T8612 Kidney transplant failure: Secondary | ICD-10-CM | POA: Diagnosis not present

## 2016-12-27 DIAGNOSIS — Z79899 Other long term (current) drug therapy: Secondary | ICD-10-CM | POA: Diagnosis not present

## 2016-12-27 DIAGNOSIS — Z79891 Long term (current) use of opiate analgesic: Secondary | ICD-10-CM | POA: Diagnosis not present

## 2016-12-27 DIAGNOSIS — R55 Syncope and collapse: Secondary | ICD-10-CM | POA: Diagnosis not present

## 2016-12-28 DIAGNOSIS — N2581 Secondary hyperparathyroidism of renal origin: Secondary | ICD-10-CM | POA: Diagnosis not present

## 2016-12-28 DIAGNOSIS — N186 End stage renal disease: Secondary | ICD-10-CM | POA: Diagnosis not present

## 2016-12-28 DIAGNOSIS — D631 Anemia in chronic kidney disease: Secondary | ICD-10-CM | POA: Diagnosis not present

## 2016-12-30 DIAGNOSIS — D631 Anemia in chronic kidney disease: Secondary | ICD-10-CM | POA: Diagnosis not present

## 2016-12-30 DIAGNOSIS — N2581 Secondary hyperparathyroidism of renal origin: Secondary | ICD-10-CM | POA: Diagnosis not present

## 2016-12-30 DIAGNOSIS — N186 End stage renal disease: Secondary | ICD-10-CM | POA: Diagnosis not present

## 2017-01-01 DIAGNOSIS — N2581 Secondary hyperparathyroidism of renal origin: Secondary | ICD-10-CM | POA: Diagnosis not present

## 2017-01-01 DIAGNOSIS — D631 Anemia in chronic kidney disease: Secondary | ICD-10-CM | POA: Diagnosis not present

## 2017-01-01 DIAGNOSIS — N186 End stage renal disease: Secondary | ICD-10-CM | POA: Diagnosis not present

## 2017-01-03 DIAGNOSIS — H2513 Age-related nuclear cataract, bilateral: Secondary | ICD-10-CM | POA: Diagnosis not present

## 2017-01-03 DIAGNOSIS — H401131 Primary open-angle glaucoma, bilateral, mild stage: Secondary | ICD-10-CM | POA: Diagnosis not present

## 2017-01-04 DIAGNOSIS — D631 Anemia in chronic kidney disease: Secondary | ICD-10-CM | POA: Diagnosis not present

## 2017-01-04 DIAGNOSIS — N2581 Secondary hyperparathyroidism of renal origin: Secondary | ICD-10-CM | POA: Diagnosis not present

## 2017-01-04 DIAGNOSIS — N186 End stage renal disease: Secondary | ICD-10-CM | POA: Diagnosis not present

## 2017-01-06 DIAGNOSIS — D631 Anemia in chronic kidney disease: Secondary | ICD-10-CM | POA: Diagnosis not present

## 2017-01-06 DIAGNOSIS — N2581 Secondary hyperparathyroidism of renal origin: Secondary | ICD-10-CM | POA: Diagnosis not present

## 2017-01-06 DIAGNOSIS — N186 End stage renal disease: Secondary | ICD-10-CM | POA: Diagnosis not present

## 2017-01-08 DIAGNOSIS — D631 Anemia in chronic kidney disease: Secondary | ICD-10-CM | POA: Diagnosis not present

## 2017-01-08 DIAGNOSIS — N186 End stage renal disease: Secondary | ICD-10-CM | POA: Diagnosis not present

## 2017-01-08 DIAGNOSIS — N2581 Secondary hyperparathyroidism of renal origin: Secondary | ICD-10-CM | POA: Diagnosis not present

## 2017-01-11 DIAGNOSIS — N2581 Secondary hyperparathyroidism of renal origin: Secondary | ICD-10-CM | POA: Diagnosis not present

## 2017-01-11 DIAGNOSIS — N186 End stage renal disease: Secondary | ICD-10-CM | POA: Diagnosis not present

## 2017-01-11 DIAGNOSIS — D631 Anemia in chronic kidney disease: Secondary | ICD-10-CM | POA: Diagnosis not present

## 2017-01-13 DIAGNOSIS — N2581 Secondary hyperparathyroidism of renal origin: Secondary | ICD-10-CM | POA: Diagnosis not present

## 2017-01-13 DIAGNOSIS — D631 Anemia in chronic kidney disease: Secondary | ICD-10-CM | POA: Diagnosis not present

## 2017-01-13 DIAGNOSIS — N186 End stage renal disease: Secondary | ICD-10-CM | POA: Diagnosis not present

## 2017-01-15 DIAGNOSIS — N186 End stage renal disease: Secondary | ICD-10-CM | POA: Diagnosis not present

## 2017-01-15 DIAGNOSIS — N2581 Secondary hyperparathyroidism of renal origin: Secondary | ICD-10-CM | POA: Diagnosis not present

## 2017-01-15 DIAGNOSIS — D631 Anemia in chronic kidney disease: Secondary | ICD-10-CM | POA: Diagnosis not present

## 2017-01-18 DIAGNOSIS — Z23 Encounter for immunization: Secondary | ICD-10-CM | POA: Diagnosis not present

## 2017-01-18 DIAGNOSIS — D631 Anemia in chronic kidney disease: Secondary | ICD-10-CM | POA: Diagnosis not present

## 2017-01-18 DIAGNOSIS — N2581 Secondary hyperparathyroidism of renal origin: Secondary | ICD-10-CM | POA: Diagnosis not present

## 2017-01-18 DIAGNOSIS — N186 End stage renal disease: Secondary | ICD-10-CM | POA: Diagnosis not present

## 2017-01-19 ENCOUNTER — Encounter: Payer: Self-pay | Admitting: Interventional Cardiology

## 2017-01-19 ENCOUNTER — Ambulatory Visit (INDEPENDENT_AMBULATORY_CARE_PROVIDER_SITE_OTHER): Payer: Medicare Other | Admitting: Interventional Cardiology

## 2017-01-19 VITALS — HR 108 | Ht 65.0 in | Wt 240.2 lb

## 2017-01-19 DIAGNOSIS — I5022 Chronic systolic (congestive) heart failure: Secondary | ICD-10-CM | POA: Diagnosis not present

## 2017-01-19 DIAGNOSIS — E1165 Type 2 diabetes mellitus with hyperglycemia: Secondary | ICD-10-CM | POA: Diagnosis not present

## 2017-01-19 DIAGNOSIS — I10 Essential (primary) hypertension: Secondary | ICD-10-CM

## 2017-01-19 DIAGNOSIS — N186 End stage renal disease: Secondary | ICD-10-CM | POA: Diagnosis not present

## 2017-01-19 DIAGNOSIS — Z992 Dependence on renal dialysis: Secondary | ICD-10-CM | POA: Diagnosis not present

## 2017-01-19 DIAGNOSIS — I428 Other cardiomyopathies: Secondary | ICD-10-CM

## 2017-01-19 NOTE — Progress Notes (Signed)
Cardiology Office Note    Date:  01/19/2017   ID:  [name] [name], DOB 11/26/1965, MRN [id number]  PCP:  [name], MD  Cardiologist: Sinclair Grooms, MD   Chief Complaint  Patient presents with  . Congestive Heart Failure    History of Present Illness:  [name] [name] is a 51 y.o. male for non-ischemic cardiomyopathy, ESRD, DM II, and obesity. He has 2 sets of specialist. He has a nephrologist, Dr. Brayton El in [facility]. He is also is being seen by nephrology at [facility] and is on the list for transplantation.[name] He also sees a cardiologist at [facility], Dr. [name].  He has no cardiac complaints.  Low blood pressures have been an issue.  He denies dyspnea.  He has not had syncope.   Past Medical History:  Diagnosis Date  . Anal infection    posterior anal canal  . Anemia, chronic renal failure   . Atrophic kidney    BILATERAL  . DM type 2 causing ESRD [facility])    Nephrologist-- dr [name] [facility])--  on hemod[facility] since June 2012 [name]  TTS  . Hemodialysis patient [facility])    at [place] on Tues/ Thur/Sat/schedule  . Hemorrhoids   . Hepatitis B antibody positive   . History of pleural effusion    bilateral  . Hyperparathyroidism, secondary renal ([place])   . Hypertension   . Ischemic cardiomyopathy    per echo 07-01-2014  ef 45%  . LAFB (left anterior fascicular block)   . Peripheral neuropathy   . Systolic and diastolic CHF, chronic ([place])    CARDIOLOGIST-  DR Daneen Schick ([place])  AND DR [name] ([name])    Past Surgical History:  Procedure Laterality Date  . APPENDECTOMY  09-12-2004   laparotomy w/ drainage peritinitis  . AV FISTULA PLACEMENT  02-27-2010   right forearm (RADIOCEPHALIC)  . AV FISTULA REPAIR  10-30-2010  . CARDIOVASCULAR STRESS TEST  10-29-2011   dr Daneen Schick   Low risk scan;  mild perfusion defect seen in the basal  inferoseptal, basal inferior and mid inferior regions consistent with an infarct/scar and/or overlying attenuation/  mild to moderate global LVSF,  ef 40-45%  . DOBUTAMINE STRESS ECHO  07-23-2012   [name]   abnormal ;  at rest estimated lvef 25-30% and global severe LV hypokinesis ;  no cp during stress and achieved 85% maxium predicted heart rate;  negative stress ECG for inducible ischemia;  estimated lvef with stress 35-40%;  augmentation of wall segments consistant with cardiomyopathy and differential fibrosis  . FISTULOTOMY N/A 03/26/2015   Procedure: FISTULOTOMY;  Surgeon: [name], MD;  Location[facility];  Service: General;  Laterality: N/A;  . INCISION AND DRAINAGE ABSCESS N/A 03/26/2015   Procedure: ANAL INCISION AND DRAINAGE;  Surgeon: [name], MD;  Location: [place];  Service: General;  Laterality: N/A;  . RETINAL DETACHMENT SURGERY Left 2011   incomplete repair/ needs eye drops to keep pressure down  . TRANSTHORACIC ECHOCARDIOGRAM  07-01-2014    done at [facility]   grade 1 diastolic dysfunction,  ef 45%/  trace TR and PR    Current Medications: Outpatient Medications Prior to Visit  Medication Sig Dispense Refill  . aspirin 81 MG tablet Take 81 mg by mouth daily.    . cinacalcet (SENSIPAR) 60 MG tablet Take 60 mg by mouth  daily.    . clonazePAM (KLONOPIN) 0.5 MG tablet Take 0.5 mg by mouth at bedtime.    . cycloSPORINE (RESTASIS) 0.05 % ophthalmic emulsion Place 1 drop into both eyes 2 (two) times daily.    . febuxostat (ULORIC) 40 MG tablet Take 40 mg by mouth daily.     . ferric citrate (AURYXIA) 1 GM 210 MG(Fe) tablet Take 210 mg by mouth 3 (three) times daily.    [name] gabapentin (NEURONTIN) 300 MG capsule Take 300 mg by mouth 3 (three) times daily as needed. For pain  3  . Insulin Aspart Prot & Aspart (NOVOLOG MIX 70/30 PENFILL [place]) Inject 14 Units into the skin 2 (two) times daily after a meal. Takes 14 units am and  evening Adjusts dose if CBG greater than 150    . LIPITOR 10 MG tablet Take 10 mg by mouth daily.  4  . Multiple Vitamins-Minerals (MEGA MULTIVITAMIN FOR MEN PO) Take by mouth.    . sevelamer carbonate (RENVELA) 800 MG tablet Take 800 mg by mouth 3 (three) times daily with meals.    . Calcium Acetate, Phos Binder, 667 MG CAPS Take 2 capsules by mouth 3 (three) times daily with meals.     . Timolol Maleate (ISTALOL) 0.5 % (DAILY) SOLN Place 1 drop into the left eye daily.     No facility-administered medications prior to visit.      Allergies:   Patient has no known allergies.   Social History   Social History  . Marital status: Single    Spouse name: N/A  . Number of children: N/A  . Years of education: N/A   Social History Main Topics  . Smoking status: Never Smoker  . Smokeless tobacco: Never Used  . Alcohol use No  . Drug use: No  . Sexual activity: Not Asked   Other Topics Concern  . None   Social History Narrative  . None     Family History:  The patient's Family history is unknown by patient.   ROS:   Please see the history of present illness.    Low blood pressures prevented successful kidney transplantation and also have an impact on dialysis sessions.  He has been permanently turned down as a kidney transplant recipient. All other systems reviewed and are negative.   PHYSICAL EXAM:   VS:  Pulse (!) 108   Ht 5\' 5"  (1.651 m)   Wt 240 lb 3.2 oz (109 kg)   BMI 39.97 kg/m    GEN: Well nourished, well developed, in no acute distress.  Morbidly obese. HEENT: normal  Neck: no JVD, carotid bruits, or masses Cardiac: RRR; no murmurs, rubs, or gallops,no edema  Respiratory:  clear to auscultation bilaterally, normal work of breathing GI: soft, nontender, nondistended, + BS MS: no deformity or atrophy  Skin: warm and dry, no rash Neuro:  Alert and Oriented x 3, Strength and sensation are intact Psych: euthymic mood, full affect  Wt Readings from Last 3  Encounters:  01/19/17 240 lb 3.2 oz (109 kg)  03/14/16 239 lb 6.4 oz (108.6 kg)  03/08/16 245 lb 6.4 oz (111.3 kg)      Studies/Labs Reviewed:   EKG:  EKG  Not done  Recent Labs: 03/13/2016: TSH 0.263 03/14/2016: ALT 12; BUN 16; BUN 15; Creatinine, Ser 8.55; Creatinine, Ser 8.69; Hemoglobin 16.4; Platelets 126; Potassium 4.2; Potassium 4.1; Sodium 136; Sodium 138   Lipid Panel No results found for: CHOL, TRIG, HDL, CHOLHDL, VLDL, LDLCALC, LDLDIRECT  Additional studies/ records that were reviewed today include:  None    ASSESSMENT:    1. Chronic systolic heart failure ([place])   2. Essential hypertension   3. Non-ischemic cardiomyopathy ([place])   4. Uncontrolled type 2 diabetes mellitus with hyperglycemia ([place])   5. CKD (chronic kidney disease) stage V requiring chronic dialysis ([place])      PLAN:  In order of problems listed above:  1. No up-to-date systolic function.  Last EF was 45-50% of [facility].  Plan 2D Doppler echocardiogram.  No therapy currently possible because of inability to hear blood pressure. 2. Severe hypotension.  On no therapy.  Likely related to autonomic dysfunction. 3. Update echo to assess LV function.  He has sinus tachycardia which may be reflux due to low LV function. 4. Not addressed 5. End-stage kidney disease on dialysis.  Failed kidney transplant.  Not a candidate for repeat transplantation.  Clinical follow-up in 9-12 months.  No medical therapy possible at this time due to low blood pressure.  Overall prognosis is relatively poor given features noted above.  Medication Adjustments/Labs and Tests Ordered: Current medicines are reviewed at length with the patient today.  Concerns regarding medicines are outlined above.  Medication changes, Labs and Tests ordered today are listed in the Patient Instructions below. There are no Patient Instructions on file for this visit.   Signed, Sinclair Grooms, MD    01/19/2017 9:48 AM    [facility] [place], [place], [place]  [address] Phone: [phone]; Fax: [phone]

## 2017-01-19 NOTE — Patient Instructions (Signed)
Medication Instructions:  Your physician recommends that you continue on your current medications as directed. Please refer to the Current Medication list given to you today.  Labwork: None  Testing/Procedures: Your physician has requested that you have an echocardiogram. Echocardiography is a painless test that uses sound waves to create images of your heart. It provides your doctor with information about the size and shape of your heart and how well your heart's chambers and valves are working. This procedure takes approximately one hour. There are no restrictions for this procedure.   Follow-Up: Your physician wants you to follow-up in: 6-9 months with Dr. Tamala Julian.  You will receive a reminder letter in the mail two months in advance. If you don't receive a letter, please call our office to schedule the follow-up appointment.   Any Other Special Instructions Will Be Listed Below (If Applicable).     If you need a refill on your cardiac medications before your next appointment, please call your pharmacy.

## 2017-01-20 DIAGNOSIS — N186 End stage renal disease: Secondary | ICD-10-CM | POA: Diagnosis not present

## 2017-01-20 DIAGNOSIS — N2581 Secondary hyperparathyroidism of renal origin: Secondary | ICD-10-CM | POA: Diagnosis not present

## 2017-01-20 DIAGNOSIS — D631 Anemia in chronic kidney disease: Secondary | ICD-10-CM | POA: Diagnosis not present

## 2017-01-22 DIAGNOSIS — D631 Anemia in chronic kidney disease: Secondary | ICD-10-CM | POA: Diagnosis not present

## 2017-01-22 DIAGNOSIS — N2581 Secondary hyperparathyroidism of renal origin: Secondary | ICD-10-CM | POA: Diagnosis not present

## 2017-01-22 DIAGNOSIS — N186 End stage renal disease: Secondary | ICD-10-CM | POA: Diagnosis not present

## 2017-01-25 DIAGNOSIS — N2581 Secondary hyperparathyroidism of renal origin: Secondary | ICD-10-CM | POA: Diagnosis not present

## 2017-01-25 DIAGNOSIS — D631 Anemia in chronic kidney disease: Secondary | ICD-10-CM | POA: Diagnosis not present

## 2017-01-25 DIAGNOSIS — N186 End stage renal disease: Secondary | ICD-10-CM | POA: Diagnosis not present

## 2017-01-26 ENCOUNTER — Ambulatory Visit (HOSPITAL_COMMUNITY): Payer: Medicare Other | Attending: Cardiovascular Disease

## 2017-01-26 ENCOUNTER — Other Ambulatory Visit: Payer: Self-pay

## 2017-01-26 DIAGNOSIS — I132 Hypertensive heart and chronic kidney disease with heart failure and with stage 5 chronic kidney disease, or end stage renal disease: Secondary | ICD-10-CM | POA: Diagnosis not present

## 2017-01-26 DIAGNOSIS — I255 Ischemic cardiomyopathy: Secondary | ICD-10-CM | POA: Insufficient documentation

## 2017-01-26 DIAGNOSIS — Z992 Dependence on renal dialysis: Secondary | ICD-10-CM | POA: Insufficient documentation

## 2017-01-26 DIAGNOSIS — I059 Rheumatic mitral valve disease, unspecified: Secondary | ICD-10-CM | POA: Insufficient documentation

## 2017-01-26 DIAGNOSIS — B1910 Unspecified viral hepatitis B without hepatic coma: Secondary | ICD-10-CM | POA: Insufficient documentation

## 2017-01-26 DIAGNOSIS — N186 End stage renal disease: Secondary | ICD-10-CM | POA: Insufficient documentation

## 2017-01-26 DIAGNOSIS — I5022 Chronic systolic (congestive) heart failure: Secondary | ICD-10-CM

## 2017-01-26 DIAGNOSIS — E1122 Type 2 diabetes mellitus with diabetic chronic kidney disease: Secondary | ICD-10-CM | POA: Diagnosis not present

## 2017-01-26 MED ORDER — PERFLUTREN LIPID MICROSPHERE
1.0000 mL | INTRAVENOUS | Status: AC | PRN
  Administered 2017-01-26: 2 mL via INTRAVENOUS

## 2017-01-27 DIAGNOSIS — E119 Type 2 diabetes mellitus without complications: Secondary | ICD-10-CM | POA: Diagnosis not present

## 2017-01-27 DIAGNOSIS — N2581 Secondary hyperparathyroidism of renal origin: Secondary | ICD-10-CM | POA: Diagnosis not present

## 2017-01-27 DIAGNOSIS — N186 End stage renal disease: Secondary | ICD-10-CM | POA: Diagnosis not present

## 2017-01-27 DIAGNOSIS — D631 Anemia in chronic kidney disease: Secondary | ICD-10-CM | POA: Diagnosis not present

## 2017-01-29 DIAGNOSIS — N2581 Secondary hyperparathyroidism of renal origin: Secondary | ICD-10-CM | POA: Diagnosis not present

## 2017-01-29 DIAGNOSIS — D631 Anemia in chronic kidney disease: Secondary | ICD-10-CM | POA: Diagnosis not present

## 2017-01-29 DIAGNOSIS — N186 End stage renal disease: Secondary | ICD-10-CM | POA: Diagnosis not present

## 2017-02-01 DIAGNOSIS — N2581 Secondary hyperparathyroidism of renal origin: Secondary | ICD-10-CM | POA: Diagnosis not present

## 2017-02-01 DIAGNOSIS — D631 Anemia in chronic kidney disease: Secondary | ICD-10-CM | POA: Diagnosis not present

## 2017-02-01 DIAGNOSIS — N186 End stage renal disease: Secondary | ICD-10-CM | POA: Diagnosis not present

## 2017-02-03 DIAGNOSIS — D631 Anemia in chronic kidney disease: Secondary | ICD-10-CM | POA: Diagnosis not present

## 2017-02-03 DIAGNOSIS — N186 End stage renal disease: Secondary | ICD-10-CM | POA: Diagnosis not present

## 2017-02-03 DIAGNOSIS — N2581 Secondary hyperparathyroidism of renal origin: Secondary | ICD-10-CM | POA: Diagnosis not present

## 2017-02-05 DIAGNOSIS — D631 Anemia in chronic kidney disease: Secondary | ICD-10-CM | POA: Diagnosis not present

## 2017-02-05 DIAGNOSIS — N2581 Secondary hyperparathyroidism of renal origin: Secondary | ICD-10-CM | POA: Diagnosis not present

## 2017-02-05 DIAGNOSIS — N186 End stage renal disease: Secondary | ICD-10-CM | POA: Diagnosis not present

## 2017-02-07 DIAGNOSIS — N186 End stage renal disease: Secondary | ICD-10-CM | POA: Diagnosis not present

## 2017-02-07 DIAGNOSIS — D631 Anemia in chronic kidney disease: Secondary | ICD-10-CM | POA: Diagnosis not present

## 2017-02-07 DIAGNOSIS — N2581 Secondary hyperparathyroidism of renal origin: Secondary | ICD-10-CM | POA: Diagnosis not present

## 2017-02-09 DIAGNOSIS — N186 End stage renal disease: Secondary | ICD-10-CM | POA: Diagnosis not present

## 2017-02-09 DIAGNOSIS — D631 Anemia in chronic kidney disease: Secondary | ICD-10-CM | POA: Diagnosis not present

## 2017-02-09 DIAGNOSIS — N2581 Secondary hyperparathyroidism of renal origin: Secondary | ICD-10-CM | POA: Diagnosis not present

## 2017-02-12 DIAGNOSIS — N2581 Secondary hyperparathyroidism of renal origin: Secondary | ICD-10-CM | POA: Diagnosis not present

## 2017-02-12 DIAGNOSIS — D631 Anemia in chronic kidney disease: Secondary | ICD-10-CM | POA: Diagnosis not present

## 2017-02-12 DIAGNOSIS — N186 End stage renal disease: Secondary | ICD-10-CM | POA: Diagnosis not present

## 2017-02-15 DIAGNOSIS — N186 End stage renal disease: Secondary | ICD-10-CM | POA: Diagnosis not present

## 2017-02-15 DIAGNOSIS — N2581 Secondary hyperparathyroidism of renal origin: Secondary | ICD-10-CM | POA: Diagnosis not present

## 2017-02-15 DIAGNOSIS — D631 Anemia in chronic kidney disease: Secondary | ICD-10-CM | POA: Diagnosis not present

## 2017-02-16 ENCOUNTER — Ambulatory Visit (INDEPENDENT_AMBULATORY_CARE_PROVIDER_SITE_OTHER): Payer: Medicare Other | Admitting: Ophthalmology

## 2017-02-16 DIAGNOSIS — H2513 Age-related nuclear cataract, bilateral: Secondary | ICD-10-CM

## 2017-02-16 DIAGNOSIS — E10311 Type 1 diabetes mellitus with unspecified diabetic retinopathy with macular edema: Secondary | ICD-10-CM

## 2017-02-16 DIAGNOSIS — E103512 Type 1 diabetes mellitus with proliferative diabetic retinopathy with macular edema, left eye: Secondary | ICD-10-CM

## 2017-02-16 DIAGNOSIS — E103522 Type 1 diabetes mellitus with proliferative diabetic retinopathy with traction retinal detachment involving the macula, left eye: Secondary | ICD-10-CM | POA: Diagnosis not present

## 2017-02-16 DIAGNOSIS — H43813 Vitreous degeneration, bilateral: Secondary | ICD-10-CM

## 2017-02-16 DIAGNOSIS — E103591 Type 1 diabetes mellitus with proliferative diabetic retinopathy without macular edema, right eye: Secondary | ICD-10-CM | POA: Diagnosis not present

## 2017-02-17 DIAGNOSIS — D631 Anemia in chronic kidney disease: Secondary | ICD-10-CM | POA: Diagnosis not present

## 2017-02-17 DIAGNOSIS — N186 End stage renal disease: Secondary | ICD-10-CM | POA: Diagnosis not present

## 2017-02-17 DIAGNOSIS — N2581 Secondary hyperparathyroidism of renal origin: Secondary | ICD-10-CM | POA: Diagnosis not present

## 2017-02-18 DIAGNOSIS — Z992 Dependence on renal dialysis: Secondary | ICD-10-CM | POA: Diagnosis not present

## 2017-02-18 DIAGNOSIS — N186 End stage renal disease: Secondary | ICD-10-CM | POA: Diagnosis not present

## 2017-02-19 DIAGNOSIS — D631 Anemia in chronic kidney disease: Secondary | ICD-10-CM | POA: Diagnosis not present

## 2017-02-19 DIAGNOSIS — N2581 Secondary hyperparathyroidism of renal origin: Secondary | ICD-10-CM | POA: Diagnosis not present

## 2017-02-19 DIAGNOSIS — N186 End stage renal disease: Secondary | ICD-10-CM | POA: Diagnosis not present

## 2017-02-22 DIAGNOSIS — N186 End stage renal disease: Secondary | ICD-10-CM | POA: Diagnosis not present

## 2017-02-22 DIAGNOSIS — N2581 Secondary hyperparathyroidism of renal origin: Secondary | ICD-10-CM | POA: Diagnosis not present

## 2017-02-22 DIAGNOSIS — D631 Anemia in chronic kidney disease: Secondary | ICD-10-CM | POA: Diagnosis not present

## 2017-02-24 DIAGNOSIS — D631 Anemia in chronic kidney disease: Secondary | ICD-10-CM | POA: Diagnosis not present

## 2017-02-24 DIAGNOSIS — N2581 Secondary hyperparathyroidism of renal origin: Secondary | ICD-10-CM | POA: Diagnosis not present

## 2017-02-24 DIAGNOSIS — N186 End stage renal disease: Secondary | ICD-10-CM | POA: Diagnosis not present

## 2017-02-26 DIAGNOSIS — N2581 Secondary hyperparathyroidism of renal origin: Secondary | ICD-10-CM | POA: Diagnosis not present

## 2017-02-26 DIAGNOSIS — D631 Anemia in chronic kidney disease: Secondary | ICD-10-CM | POA: Diagnosis not present

## 2017-02-26 DIAGNOSIS — N186 End stage renal disease: Secondary | ICD-10-CM | POA: Diagnosis not present

## 2017-03-03 DIAGNOSIS — N186 End stage renal disease: Secondary | ICD-10-CM | POA: Diagnosis not present

## 2017-03-03 DIAGNOSIS — N2581 Secondary hyperparathyroidism of renal origin: Secondary | ICD-10-CM | POA: Diagnosis not present

## 2017-03-03 DIAGNOSIS — D631 Anemia in chronic kidney disease: Secondary | ICD-10-CM | POA: Diagnosis not present

## 2017-03-05 DIAGNOSIS — N2581 Secondary hyperparathyroidism of renal origin: Secondary | ICD-10-CM | POA: Diagnosis not present

## 2017-03-05 DIAGNOSIS — D631 Anemia in chronic kidney disease: Secondary | ICD-10-CM | POA: Diagnosis not present

## 2017-03-05 DIAGNOSIS — N186 End stage renal disease: Secondary | ICD-10-CM | POA: Diagnosis not present

## 2017-03-08 DIAGNOSIS — N186 End stage renal disease: Secondary | ICD-10-CM | POA: Diagnosis not present

## 2017-03-08 DIAGNOSIS — D631 Anemia in chronic kidney disease: Secondary | ICD-10-CM | POA: Diagnosis not present

## 2017-03-08 DIAGNOSIS — N2581 Secondary hyperparathyroidism of renal origin: Secondary | ICD-10-CM | POA: Diagnosis not present

## 2017-03-10 DIAGNOSIS — N186 End stage renal disease: Secondary | ICD-10-CM | POA: Diagnosis not present

## 2017-03-10 DIAGNOSIS — N2581 Secondary hyperparathyroidism of renal origin: Secondary | ICD-10-CM | POA: Diagnosis not present

## 2017-03-10 DIAGNOSIS — D631 Anemia in chronic kidney disease: Secondary | ICD-10-CM | POA: Diagnosis not present

## 2017-03-12 DIAGNOSIS — N186 End stage renal disease: Secondary | ICD-10-CM | POA: Diagnosis not present

## 2017-03-12 DIAGNOSIS — D631 Anemia in chronic kidney disease: Secondary | ICD-10-CM | POA: Diagnosis not present

## 2017-03-12 DIAGNOSIS — N2581 Secondary hyperparathyroidism of renal origin: Secondary | ICD-10-CM | POA: Diagnosis not present

## 2017-03-14 DIAGNOSIS — N2581 Secondary hyperparathyroidism of renal origin: Secondary | ICD-10-CM | POA: Diagnosis not present

## 2017-03-14 DIAGNOSIS — N186 End stage renal disease: Secondary | ICD-10-CM | POA: Diagnosis not present

## 2017-03-14 DIAGNOSIS — D631 Anemia in chronic kidney disease: Secondary | ICD-10-CM | POA: Diagnosis not present

## 2017-03-17 DIAGNOSIS — N2581 Secondary hyperparathyroidism of renal origin: Secondary | ICD-10-CM | POA: Diagnosis not present

## 2017-03-17 DIAGNOSIS — N186 End stage renal disease: Secondary | ICD-10-CM | POA: Diagnosis not present

## 2017-03-17 DIAGNOSIS — D631 Anemia in chronic kidney disease: Secondary | ICD-10-CM | POA: Diagnosis not present

## 2017-03-19 DIAGNOSIS — D631 Anemia in chronic kidney disease: Secondary | ICD-10-CM | POA: Diagnosis not present

## 2017-03-19 DIAGNOSIS — N186 End stage renal disease: Secondary | ICD-10-CM | POA: Diagnosis not present

## 2017-03-19 DIAGNOSIS — N2581 Secondary hyperparathyroidism of renal origin: Secondary | ICD-10-CM | POA: Diagnosis not present

## 2017-03-21 DIAGNOSIS — D631 Anemia in chronic kidney disease: Secondary | ICD-10-CM | POA: Diagnosis not present

## 2017-03-21 DIAGNOSIS — N2581 Secondary hyperparathyroidism of renal origin: Secondary | ICD-10-CM | POA: Diagnosis not present

## 2017-03-21 DIAGNOSIS — N186 End stage renal disease: Secondary | ICD-10-CM | POA: Diagnosis not present

## 2017-03-21 DIAGNOSIS — Z992 Dependence on renal dialysis: Secondary | ICD-10-CM | POA: Diagnosis not present

## 2017-03-24 DIAGNOSIS — D631 Anemia in chronic kidney disease: Secondary | ICD-10-CM | POA: Diagnosis not present

## 2017-03-24 DIAGNOSIS — E119 Type 2 diabetes mellitus without complications: Secondary | ICD-10-CM | POA: Diagnosis not present

## 2017-03-24 DIAGNOSIS — M109 Gout, unspecified: Secondary | ICD-10-CM | POA: Diagnosis not present

## 2017-03-24 DIAGNOSIS — E8779 Other fluid overload: Secondary | ICD-10-CM | POA: Diagnosis not present

## 2017-03-24 DIAGNOSIS — N186 End stage renal disease: Secondary | ICD-10-CM | POA: Diagnosis not present

## 2017-03-24 DIAGNOSIS — E785 Hyperlipidemia, unspecified: Secondary | ICD-10-CM | POA: Diagnosis not present

## 2017-03-24 DIAGNOSIS — N2581 Secondary hyperparathyroidism of renal origin: Secondary | ICD-10-CM | POA: Diagnosis not present

## 2017-03-24 DIAGNOSIS — Z1159 Encounter for screening for other viral diseases: Secondary | ICD-10-CM | POA: Diagnosis not present

## 2017-03-26 DIAGNOSIS — E8779 Other fluid overload: Secondary | ICD-10-CM | POA: Diagnosis not present

## 2017-03-26 DIAGNOSIS — N2581 Secondary hyperparathyroidism of renal origin: Secondary | ICD-10-CM | POA: Diagnosis not present

## 2017-03-26 DIAGNOSIS — D631 Anemia in chronic kidney disease: Secondary | ICD-10-CM | POA: Diagnosis not present

## 2017-03-26 DIAGNOSIS — N186 End stage renal disease: Secondary | ICD-10-CM | POA: Diagnosis not present

## 2017-03-29 DIAGNOSIS — N2581 Secondary hyperparathyroidism of renal origin: Secondary | ICD-10-CM | POA: Diagnosis not present

## 2017-03-29 DIAGNOSIS — E8779 Other fluid overload: Secondary | ICD-10-CM | POA: Diagnosis not present

## 2017-03-29 DIAGNOSIS — N186 End stage renal disease: Secondary | ICD-10-CM | POA: Diagnosis not present

## 2017-03-29 DIAGNOSIS — D631 Anemia in chronic kidney disease: Secondary | ICD-10-CM | POA: Diagnosis not present

## 2017-03-31 DIAGNOSIS — E8779 Other fluid overload: Secondary | ICD-10-CM | POA: Diagnosis not present

## 2017-03-31 DIAGNOSIS — N186 End stage renal disease: Secondary | ICD-10-CM | POA: Diagnosis not present

## 2017-03-31 DIAGNOSIS — D631 Anemia in chronic kidney disease: Secondary | ICD-10-CM | POA: Diagnosis not present

## 2017-03-31 DIAGNOSIS — N2581 Secondary hyperparathyroidism of renal origin: Secondary | ICD-10-CM | POA: Diagnosis not present

## 2017-04-02 DIAGNOSIS — N186 End stage renal disease: Secondary | ICD-10-CM | POA: Diagnosis not present

## 2017-04-02 DIAGNOSIS — D631 Anemia in chronic kidney disease: Secondary | ICD-10-CM | POA: Diagnosis not present

## 2017-04-02 DIAGNOSIS — N2581 Secondary hyperparathyroidism of renal origin: Secondary | ICD-10-CM | POA: Diagnosis not present

## 2017-04-02 DIAGNOSIS — E8779 Other fluid overload: Secondary | ICD-10-CM | POA: Diagnosis not present

## 2017-04-05 DIAGNOSIS — E8779 Other fluid overload: Secondary | ICD-10-CM | POA: Diagnosis not present

## 2017-04-05 DIAGNOSIS — D631 Anemia in chronic kidney disease: Secondary | ICD-10-CM | POA: Diagnosis not present

## 2017-04-05 DIAGNOSIS — N2581 Secondary hyperparathyroidism of renal origin: Secondary | ICD-10-CM | POA: Diagnosis not present

## 2017-04-05 DIAGNOSIS — N186 End stage renal disease: Secondary | ICD-10-CM | POA: Diagnosis not present

## 2017-04-06 DIAGNOSIS — H16223 Keratoconjunctivitis sicca, not specified as Sjogren's, bilateral: Secondary | ICD-10-CM | POA: Diagnosis not present

## 2017-04-06 DIAGNOSIS — H401111 Primary open-angle glaucoma, right eye, mild stage: Secondary | ICD-10-CM | POA: Diagnosis not present

## 2017-04-06 DIAGNOSIS — H2513 Age-related nuclear cataract, bilateral: Secondary | ICD-10-CM | POA: Diagnosis not present

## 2017-04-07 DIAGNOSIS — N2581 Secondary hyperparathyroidism of renal origin: Secondary | ICD-10-CM | POA: Diagnosis not present

## 2017-04-07 DIAGNOSIS — I509 Heart failure, unspecified: Secondary | ICD-10-CM | POA: Diagnosis not present

## 2017-04-07 DIAGNOSIS — T82590A Other mechanical complication of surgically created arteriovenous fistula, initial encounter: Secondary | ICD-10-CM | POA: Diagnosis not present

## 2017-04-07 DIAGNOSIS — E669 Obesity, unspecified: Secondary | ICD-10-CM | POA: Diagnosis not present

## 2017-04-07 DIAGNOSIS — E119 Type 2 diabetes mellitus without complications: Secondary | ICD-10-CM | POA: Diagnosis not present

## 2017-04-07 DIAGNOSIS — D631 Anemia in chronic kidney disease: Secondary | ICD-10-CM | POA: Diagnosis not present

## 2017-04-07 DIAGNOSIS — Z114 Encounter for screening for human immunodeficiency virus [HIV]: Secondary | ICD-10-CM | POA: Diagnosis not present

## 2017-04-07 DIAGNOSIS — Z992 Dependence on renal dialysis: Secondary | ICD-10-CM | POA: Diagnosis not present

## 2017-04-07 DIAGNOSIS — E8779 Other fluid overload: Secondary | ICD-10-CM | POA: Diagnosis not present

## 2017-04-07 DIAGNOSIS — I132 Hypertensive heart and chronic kidney disease with heart failure and with stage 5 chronic kidney disease, or end stage renal disease: Secondary | ICD-10-CM | POA: Diagnosis not present

## 2017-04-07 DIAGNOSIS — Y838 Other surgical procedures as the cause of abnormal reaction of the patient, or of later complication, without mention of misadventure at the time of the procedure: Secondary | ICD-10-CM | POA: Diagnosis not present

## 2017-04-07 DIAGNOSIS — N186 End stage renal disease: Secondary | ICD-10-CM | POA: Diagnosis not present

## 2017-04-07 DIAGNOSIS — E1122 Type 2 diabetes mellitus with diabetic chronic kidney disease: Secondary | ICD-10-CM | POA: Diagnosis not present

## 2017-04-07 DIAGNOSIS — E875 Hyperkalemia: Secondary | ICD-10-CM | POA: Diagnosis not present

## 2017-04-07 DIAGNOSIS — R9431 Abnormal electrocardiogram [ECG] [EKG]: Secondary | ICD-10-CM | POA: Diagnosis not present

## 2017-04-07 DIAGNOSIS — E785 Hyperlipidemia, unspecified: Secondary | ICD-10-CM | POA: Diagnosis not present

## 2017-04-07 DIAGNOSIS — Z1159 Encounter for screening for other viral diseases: Secondary | ICD-10-CM | POA: Diagnosis not present

## 2017-04-08 DIAGNOSIS — N186 End stage renal disease: Secondary | ICD-10-CM | POA: Diagnosis not present

## 2017-04-08 DIAGNOSIS — I871 Compression of vein: Secondary | ICD-10-CM | POA: Diagnosis not present

## 2017-04-08 DIAGNOSIS — E119 Type 2 diabetes mellitus without complications: Secondary | ICD-10-CM | POA: Diagnosis not present

## 2017-04-08 DIAGNOSIS — D631 Anemia in chronic kidney disease: Secondary | ICD-10-CM | POA: Diagnosis not present

## 2017-04-08 DIAGNOSIS — N2581 Secondary hyperparathyroidism of renal origin: Secondary | ICD-10-CM | POA: Diagnosis not present

## 2017-04-08 DIAGNOSIS — T82590A Other mechanical complication of surgically created arteriovenous fistula, initial encounter: Secondary | ICD-10-CM | POA: Diagnosis not present

## 2017-04-08 DIAGNOSIS — E8779 Other fluid overload: Secondary | ICD-10-CM | POA: Diagnosis not present

## 2017-04-11 DIAGNOSIS — N2581 Secondary hyperparathyroidism of renal origin: Secondary | ICD-10-CM | POA: Diagnosis not present

## 2017-04-11 DIAGNOSIS — D631 Anemia in chronic kidney disease: Secondary | ICD-10-CM | POA: Diagnosis not present

## 2017-04-11 DIAGNOSIS — E8779 Other fluid overload: Secondary | ICD-10-CM | POA: Diagnosis not present

## 2017-04-11 DIAGNOSIS — N186 End stage renal disease: Secondary | ICD-10-CM | POA: Diagnosis not present

## 2017-04-14 DIAGNOSIS — D631 Anemia in chronic kidney disease: Secondary | ICD-10-CM | POA: Diagnosis not present

## 2017-04-14 DIAGNOSIS — E8779 Other fluid overload: Secondary | ICD-10-CM | POA: Diagnosis not present

## 2017-04-14 DIAGNOSIS — N186 End stage renal disease: Secondary | ICD-10-CM | POA: Diagnosis not present

## 2017-04-14 DIAGNOSIS — N2581 Secondary hyperparathyroidism of renal origin: Secondary | ICD-10-CM | POA: Diagnosis not present

## 2017-04-16 DIAGNOSIS — N186 End stage renal disease: Secondary | ICD-10-CM | POA: Diagnosis not present

## 2017-04-16 DIAGNOSIS — N2581 Secondary hyperparathyroidism of renal origin: Secondary | ICD-10-CM | POA: Diagnosis not present

## 2017-04-16 DIAGNOSIS — D631 Anemia in chronic kidney disease: Secondary | ICD-10-CM | POA: Diagnosis not present

## 2017-04-16 DIAGNOSIS — E8779 Other fluid overload: Secondary | ICD-10-CM | POA: Diagnosis not present

## 2017-04-19 DIAGNOSIS — N186 End stage renal disease: Secondary | ICD-10-CM | POA: Diagnosis not present

## 2017-04-19 DIAGNOSIS — N2581 Secondary hyperparathyroidism of renal origin: Secondary | ICD-10-CM | POA: Diagnosis not present

## 2017-04-19 DIAGNOSIS — D631 Anemia in chronic kidney disease: Secondary | ICD-10-CM | POA: Diagnosis not present

## 2017-04-19 DIAGNOSIS — E8779 Other fluid overload: Secondary | ICD-10-CM | POA: Diagnosis not present

## 2017-04-21 DIAGNOSIS — Z992 Dependence on renal dialysis: Secondary | ICD-10-CM | POA: Diagnosis not present

## 2017-04-21 DIAGNOSIS — N2581 Secondary hyperparathyroidism of renal origin: Secondary | ICD-10-CM | POA: Diagnosis not present

## 2017-04-21 DIAGNOSIS — E8779 Other fluid overload: Secondary | ICD-10-CM | POA: Diagnosis not present

## 2017-04-21 DIAGNOSIS — N186 End stage renal disease: Secondary | ICD-10-CM | POA: Diagnosis not present

## 2017-04-21 DIAGNOSIS — D631 Anemia in chronic kidney disease: Secondary | ICD-10-CM | POA: Diagnosis not present

## 2017-04-23 DIAGNOSIS — D631 Anemia in chronic kidney disease: Secondary | ICD-10-CM | POA: Diagnosis not present

## 2017-04-23 DIAGNOSIS — N2581 Secondary hyperparathyroidism of renal origin: Secondary | ICD-10-CM | POA: Diagnosis not present

## 2017-04-23 DIAGNOSIS — N186 End stage renal disease: Secondary | ICD-10-CM | POA: Diagnosis not present

## 2017-04-26 DIAGNOSIS — D631 Anemia in chronic kidney disease: Secondary | ICD-10-CM | POA: Diagnosis not present

## 2017-04-26 DIAGNOSIS — N186 End stage renal disease: Secondary | ICD-10-CM | POA: Diagnosis not present

## 2017-04-26 DIAGNOSIS — N2581 Secondary hyperparathyroidism of renal origin: Secondary | ICD-10-CM | POA: Diagnosis not present

## 2017-04-28 DIAGNOSIS — D631 Anemia in chronic kidney disease: Secondary | ICD-10-CM | POA: Diagnosis not present

## 2017-04-28 DIAGNOSIS — N2581 Secondary hyperparathyroidism of renal origin: Secondary | ICD-10-CM | POA: Diagnosis not present

## 2017-04-28 DIAGNOSIS — N186 End stage renal disease: Secondary | ICD-10-CM | POA: Diagnosis not present

## 2017-04-28 DIAGNOSIS — E119 Type 2 diabetes mellitus without complications: Secondary | ICD-10-CM | POA: Diagnosis not present

## 2017-04-29 DIAGNOSIS — N186 End stage renal disease: Secondary | ICD-10-CM | POA: Diagnosis not present

## 2017-04-29 DIAGNOSIS — Z992 Dependence on renal dialysis: Secondary | ICD-10-CM | POA: Diagnosis not present

## 2017-04-29 DIAGNOSIS — T82898A Other specified complication of vascular prosthetic devices, implants and grafts, initial encounter: Secondary | ICD-10-CM | POA: Diagnosis not present

## 2017-04-30 DIAGNOSIS — N186 End stage renal disease: Secondary | ICD-10-CM | POA: Diagnosis not present

## 2017-04-30 DIAGNOSIS — N2581 Secondary hyperparathyroidism of renal origin: Secondary | ICD-10-CM | POA: Diagnosis not present

## 2017-04-30 DIAGNOSIS — D631 Anemia in chronic kidney disease: Secondary | ICD-10-CM | POA: Diagnosis not present

## 2017-05-02 DIAGNOSIS — N186 End stage renal disease: Secondary | ICD-10-CM | POA: Diagnosis not present

## 2017-05-02 DIAGNOSIS — Z79899 Other long term (current) drug therapy: Secondary | ICD-10-CM | POA: Diagnosis not present

## 2017-05-02 DIAGNOSIS — L905 Scar conditions and fibrosis of skin: Secondary | ICD-10-CM | POA: Diagnosis not present

## 2017-05-02 DIAGNOSIS — M2550 Pain in unspecified joint: Secondary | ICD-10-CM | POA: Diagnosis not present

## 2017-05-02 DIAGNOSIS — E1121 Type 2 diabetes mellitus with diabetic nephropathy: Secondary | ICD-10-CM | POA: Diagnosis not present

## 2017-05-02 DIAGNOSIS — E669 Obesity, unspecified: Secondary | ICD-10-CM | POA: Diagnosis not present

## 2017-05-02 DIAGNOSIS — I5030 Unspecified diastolic (congestive) heart failure: Secondary | ICD-10-CM | POA: Diagnosis not present

## 2017-05-02 DIAGNOSIS — Z79891 Long term (current) use of opiate analgesic: Secondary | ICD-10-CM | POA: Diagnosis not present

## 2017-05-02 DIAGNOSIS — E1140 Type 2 diabetes mellitus with diabetic neuropathy, unspecified: Secondary | ICD-10-CM | POA: Diagnosis not present

## 2017-05-02 DIAGNOSIS — E875 Hyperkalemia: Secondary | ICD-10-CM | POA: Diagnosis not present

## 2017-05-02 DIAGNOSIS — M1990 Unspecified osteoarthritis, unspecified site: Secondary | ICD-10-CM | POA: Diagnosis not present

## 2017-05-02 DIAGNOSIS — E7800 Pure hypercholesterolemia, unspecified: Secondary | ICD-10-CM | POA: Diagnosis not present

## 2017-05-03 DIAGNOSIS — N186 End stage renal disease: Secondary | ICD-10-CM | POA: Diagnosis not present

## 2017-05-03 DIAGNOSIS — N2581 Secondary hyperparathyroidism of renal origin: Secondary | ICD-10-CM | POA: Diagnosis not present

## 2017-05-03 DIAGNOSIS — D631 Anemia in chronic kidney disease: Secondary | ICD-10-CM | POA: Diagnosis not present

## 2017-05-05 DIAGNOSIS — D631 Anemia in chronic kidney disease: Secondary | ICD-10-CM | POA: Diagnosis not present

## 2017-05-05 DIAGNOSIS — N2581 Secondary hyperparathyroidism of renal origin: Secondary | ICD-10-CM | POA: Diagnosis not present

## 2017-05-05 DIAGNOSIS — N186 End stage renal disease: Secondary | ICD-10-CM | POA: Diagnosis not present

## 2017-05-07 DIAGNOSIS — D631 Anemia in chronic kidney disease: Secondary | ICD-10-CM | POA: Diagnosis not present

## 2017-05-07 DIAGNOSIS — N2581 Secondary hyperparathyroidism of renal origin: Secondary | ICD-10-CM | POA: Diagnosis not present

## 2017-05-07 DIAGNOSIS — N186 End stage renal disease: Secondary | ICD-10-CM | POA: Diagnosis not present

## 2017-05-10 DIAGNOSIS — D631 Anemia in chronic kidney disease: Secondary | ICD-10-CM | POA: Diagnosis not present

## 2017-05-10 DIAGNOSIS — N186 End stage renal disease: Secondary | ICD-10-CM | POA: Diagnosis not present

## 2017-05-10 DIAGNOSIS — N2581 Secondary hyperparathyroidism of renal origin: Secondary | ICD-10-CM | POA: Diagnosis not present

## 2017-05-12 DIAGNOSIS — D631 Anemia in chronic kidney disease: Secondary | ICD-10-CM | POA: Diagnosis not present

## 2017-05-12 DIAGNOSIS — N2581 Secondary hyperparathyroidism of renal origin: Secondary | ICD-10-CM | POA: Diagnosis not present

## 2017-05-12 DIAGNOSIS — N186 End stage renal disease: Secondary | ICD-10-CM | POA: Diagnosis not present

## 2017-05-14 DIAGNOSIS — D631 Anemia in chronic kidney disease: Secondary | ICD-10-CM | POA: Diagnosis not present

## 2017-05-14 DIAGNOSIS — N2581 Secondary hyperparathyroidism of renal origin: Secondary | ICD-10-CM | POA: Diagnosis not present

## 2017-05-14 DIAGNOSIS — N186 End stage renal disease: Secondary | ICD-10-CM | POA: Diagnosis not present

## 2017-05-17 DIAGNOSIS — N2581 Secondary hyperparathyroidism of renal origin: Secondary | ICD-10-CM | POA: Diagnosis not present

## 2017-05-17 DIAGNOSIS — D631 Anemia in chronic kidney disease: Secondary | ICD-10-CM | POA: Diagnosis not present

## 2017-05-17 DIAGNOSIS — N186 End stage renal disease: Secondary | ICD-10-CM | POA: Diagnosis not present

## 2017-05-19 DIAGNOSIS — Z992 Dependence on renal dialysis: Secondary | ICD-10-CM | POA: Diagnosis not present

## 2017-05-19 DIAGNOSIS — N186 End stage renal disease: Secondary | ICD-10-CM | POA: Diagnosis not present

## 2017-05-20 DIAGNOSIS — E119 Type 2 diabetes mellitus without complications: Secondary | ICD-10-CM | POA: Diagnosis not present

## 2017-05-20 DIAGNOSIS — J0100 Acute maxillary sinusitis, unspecified: Secondary | ICD-10-CM | POA: Diagnosis not present

## 2017-05-21 DIAGNOSIS — Z23 Encounter for immunization: Secondary | ICD-10-CM | POA: Diagnosis not present

## 2017-05-21 DIAGNOSIS — D631 Anemia in chronic kidney disease: Secondary | ICD-10-CM | POA: Diagnosis not present

## 2017-05-21 DIAGNOSIS — N2581 Secondary hyperparathyroidism of renal origin: Secondary | ICD-10-CM | POA: Diagnosis not present

## 2017-05-21 DIAGNOSIS — N186 End stage renal disease: Secondary | ICD-10-CM | POA: Diagnosis not present

## 2017-05-23 DIAGNOSIS — N186 End stage renal disease: Secondary | ICD-10-CM | POA: Diagnosis not present

## 2017-05-23 DIAGNOSIS — I10 Essential (primary) hypertension: Secondary | ICD-10-CM | POA: Diagnosis not present

## 2017-05-23 DIAGNOSIS — E1165 Type 2 diabetes mellitus with hyperglycemia: Secondary | ICD-10-CM | POA: Diagnosis not present

## 2017-05-23 DIAGNOSIS — E11319 Type 2 diabetes mellitus with unspecified diabetic retinopathy without macular edema: Secondary | ICD-10-CM | POA: Diagnosis not present

## 2017-05-23 DIAGNOSIS — I509 Heart failure, unspecified: Secondary | ICD-10-CM | POA: Diagnosis not present

## 2017-05-23 DIAGNOSIS — G609 Hereditary and idiopathic neuropathy, unspecified: Secondary | ICD-10-CM | POA: Diagnosis not present

## 2017-05-24 DIAGNOSIS — N2581 Secondary hyperparathyroidism of renal origin: Secondary | ICD-10-CM | POA: Diagnosis not present

## 2017-05-24 DIAGNOSIS — Z23 Encounter for immunization: Secondary | ICD-10-CM | POA: Diagnosis not present

## 2017-05-24 DIAGNOSIS — N186 End stage renal disease: Secondary | ICD-10-CM | POA: Diagnosis not present

## 2017-05-24 DIAGNOSIS — D631 Anemia in chronic kidney disease: Secondary | ICD-10-CM | POA: Diagnosis not present

## 2017-05-26 DIAGNOSIS — E119 Type 2 diabetes mellitus without complications: Secondary | ICD-10-CM | POA: Diagnosis not present

## 2017-05-26 DIAGNOSIS — D631 Anemia in chronic kidney disease: Secondary | ICD-10-CM | POA: Diagnosis not present

## 2017-05-26 DIAGNOSIS — N2581 Secondary hyperparathyroidism of renal origin: Secondary | ICD-10-CM | POA: Diagnosis not present

## 2017-05-26 DIAGNOSIS — N186 End stage renal disease: Secondary | ICD-10-CM | POA: Diagnosis not present

## 2017-05-26 DIAGNOSIS — Z23 Encounter for immunization: Secondary | ICD-10-CM | POA: Diagnosis not present

## 2017-05-28 DIAGNOSIS — N2581 Secondary hyperparathyroidism of renal origin: Secondary | ICD-10-CM | POA: Diagnosis not present

## 2017-05-28 DIAGNOSIS — N186 End stage renal disease: Secondary | ICD-10-CM | POA: Diagnosis not present

## 2017-05-28 DIAGNOSIS — D631 Anemia in chronic kidney disease: Secondary | ICD-10-CM | POA: Diagnosis not present

## 2017-05-28 DIAGNOSIS — Z23 Encounter for immunization: Secondary | ICD-10-CM | POA: Diagnosis not present

## 2017-05-30 DIAGNOSIS — I509 Heart failure, unspecified: Secondary | ICD-10-CM | POA: Diagnosis not present

## 2017-05-30 DIAGNOSIS — G609 Hereditary and idiopathic neuropathy, unspecified: Secondary | ICD-10-CM | POA: Diagnosis not present

## 2017-05-30 DIAGNOSIS — N186 End stage renal disease: Secondary | ICD-10-CM | POA: Diagnosis not present

## 2017-05-30 DIAGNOSIS — I10 Essential (primary) hypertension: Secondary | ICD-10-CM | POA: Diagnosis not present

## 2017-05-30 DIAGNOSIS — E11319 Type 2 diabetes mellitus with unspecified diabetic retinopathy without macular edema: Secondary | ICD-10-CM | POA: Diagnosis not present

## 2017-05-30 DIAGNOSIS — E1165 Type 2 diabetes mellitus with hyperglycemia: Secondary | ICD-10-CM | POA: Diagnosis not present

## 2017-05-31 DIAGNOSIS — N2581 Secondary hyperparathyroidism of renal origin: Secondary | ICD-10-CM | POA: Diagnosis not present

## 2017-05-31 DIAGNOSIS — N186 End stage renal disease: Secondary | ICD-10-CM | POA: Diagnosis not present

## 2017-05-31 DIAGNOSIS — D631 Anemia in chronic kidney disease: Secondary | ICD-10-CM | POA: Diagnosis not present

## 2017-05-31 DIAGNOSIS — Z23 Encounter for immunization: Secondary | ICD-10-CM | POA: Diagnosis not present

## 2017-06-02 DIAGNOSIS — N186 End stage renal disease: Secondary | ICD-10-CM | POA: Diagnosis not present

## 2017-06-02 DIAGNOSIS — Z23 Encounter for immunization: Secondary | ICD-10-CM | POA: Diagnosis not present

## 2017-06-02 DIAGNOSIS — D631 Anemia in chronic kidney disease: Secondary | ICD-10-CM | POA: Diagnosis not present

## 2017-06-02 DIAGNOSIS — N2581 Secondary hyperparathyroidism of renal origin: Secondary | ICD-10-CM | POA: Diagnosis not present

## 2017-06-04 DIAGNOSIS — Z23 Encounter for immunization: Secondary | ICD-10-CM | POA: Diagnosis not present

## 2017-06-04 DIAGNOSIS — N186 End stage renal disease: Secondary | ICD-10-CM | POA: Diagnosis not present

## 2017-06-04 DIAGNOSIS — N2581 Secondary hyperparathyroidism of renal origin: Secondary | ICD-10-CM | POA: Diagnosis not present

## 2017-06-04 DIAGNOSIS — D631 Anemia in chronic kidney disease: Secondary | ICD-10-CM | POA: Diagnosis not present

## 2017-06-07 DIAGNOSIS — Z23 Encounter for immunization: Secondary | ICD-10-CM | POA: Diagnosis not present

## 2017-06-07 DIAGNOSIS — N2581 Secondary hyperparathyroidism of renal origin: Secondary | ICD-10-CM | POA: Diagnosis not present

## 2017-06-07 DIAGNOSIS — D631 Anemia in chronic kidney disease: Secondary | ICD-10-CM | POA: Diagnosis not present

## 2017-06-07 DIAGNOSIS — N186 End stage renal disease: Secondary | ICD-10-CM | POA: Diagnosis not present

## 2017-06-09 DIAGNOSIS — D631 Anemia in chronic kidney disease: Secondary | ICD-10-CM | POA: Diagnosis not present

## 2017-06-09 DIAGNOSIS — N2581 Secondary hyperparathyroidism of renal origin: Secondary | ICD-10-CM | POA: Diagnosis not present

## 2017-06-09 DIAGNOSIS — Z23 Encounter for immunization: Secondary | ICD-10-CM | POA: Diagnosis not present

## 2017-06-09 DIAGNOSIS — N186 End stage renal disease: Secondary | ICD-10-CM | POA: Diagnosis not present

## 2017-06-11 DIAGNOSIS — N2581 Secondary hyperparathyroidism of renal origin: Secondary | ICD-10-CM | POA: Diagnosis not present

## 2017-06-11 DIAGNOSIS — D631 Anemia in chronic kidney disease: Secondary | ICD-10-CM | POA: Diagnosis not present

## 2017-06-11 DIAGNOSIS — Z23 Encounter for immunization: Secondary | ICD-10-CM | POA: Diagnosis not present

## 2017-06-11 DIAGNOSIS — N186 End stage renal disease: Secondary | ICD-10-CM | POA: Diagnosis not present

## 2017-06-14 DIAGNOSIS — N186 End stage renal disease: Secondary | ICD-10-CM | POA: Diagnosis not present

## 2017-06-14 DIAGNOSIS — Z23 Encounter for immunization: Secondary | ICD-10-CM | POA: Diagnosis not present

## 2017-06-14 DIAGNOSIS — D631 Anemia in chronic kidney disease: Secondary | ICD-10-CM | POA: Diagnosis not present

## 2017-06-14 DIAGNOSIS — N2581 Secondary hyperparathyroidism of renal origin: Secondary | ICD-10-CM | POA: Diagnosis not present

## 2017-06-16 DIAGNOSIS — N2581 Secondary hyperparathyroidism of renal origin: Secondary | ICD-10-CM | POA: Diagnosis not present

## 2017-06-16 DIAGNOSIS — N186 End stage renal disease: Secondary | ICD-10-CM | POA: Diagnosis not present

## 2017-06-16 DIAGNOSIS — D631 Anemia in chronic kidney disease: Secondary | ICD-10-CM | POA: Diagnosis not present

## 2017-06-16 DIAGNOSIS — Z23 Encounter for immunization: Secondary | ICD-10-CM | POA: Diagnosis not present

## 2017-06-18 DIAGNOSIS — Z23 Encounter for immunization: Secondary | ICD-10-CM | POA: Diagnosis not present

## 2017-06-18 DIAGNOSIS — N2581 Secondary hyperparathyroidism of renal origin: Secondary | ICD-10-CM | POA: Diagnosis not present

## 2017-06-18 DIAGNOSIS — N186 End stage renal disease: Secondary | ICD-10-CM | POA: Diagnosis not present

## 2017-06-18 DIAGNOSIS — D631 Anemia in chronic kidney disease: Secondary | ICD-10-CM | POA: Diagnosis not present

## 2017-06-19 DIAGNOSIS — Z992 Dependence on renal dialysis: Secondary | ICD-10-CM | POA: Diagnosis not present

## 2017-06-19 DIAGNOSIS — N186 End stage renal disease: Secondary | ICD-10-CM | POA: Diagnosis not present

## 2017-06-20 DIAGNOSIS — N186 End stage renal disease: Secondary | ICD-10-CM | POA: Diagnosis not present

## 2017-06-20 DIAGNOSIS — N2581 Secondary hyperparathyroidism of renal origin: Secondary | ICD-10-CM | POA: Diagnosis not present

## 2017-06-20 DIAGNOSIS — D631 Anemia in chronic kidney disease: Secondary | ICD-10-CM | POA: Diagnosis not present

## 2017-06-21 DIAGNOSIS — D631 Anemia in chronic kidney disease: Secondary | ICD-10-CM | POA: Diagnosis not present

## 2017-06-21 DIAGNOSIS — N2581 Secondary hyperparathyroidism of renal origin: Secondary | ICD-10-CM | POA: Diagnosis not present

## 2017-06-21 DIAGNOSIS — N186 End stage renal disease: Secondary | ICD-10-CM | POA: Diagnosis not present

## 2017-06-23 DIAGNOSIS — N186 End stage renal disease: Secondary | ICD-10-CM | POA: Diagnosis not present

## 2017-06-23 DIAGNOSIS — N2581 Secondary hyperparathyroidism of renal origin: Secondary | ICD-10-CM | POA: Diagnosis not present

## 2017-06-23 DIAGNOSIS — D631 Anemia in chronic kidney disease: Secondary | ICD-10-CM | POA: Diagnosis not present

## 2017-06-23 DIAGNOSIS — E785 Hyperlipidemia, unspecified: Secondary | ICD-10-CM | POA: Diagnosis not present

## 2017-06-23 DIAGNOSIS — E119 Type 2 diabetes mellitus without complications: Secondary | ICD-10-CM | POA: Diagnosis not present

## 2017-06-25 DIAGNOSIS — N186 End stage renal disease: Secondary | ICD-10-CM | POA: Diagnosis not present

## 2017-06-25 DIAGNOSIS — D631 Anemia in chronic kidney disease: Secondary | ICD-10-CM | POA: Diagnosis not present

## 2017-06-25 DIAGNOSIS — N2581 Secondary hyperparathyroidism of renal origin: Secondary | ICD-10-CM | POA: Diagnosis not present

## 2017-06-28 DIAGNOSIS — D631 Anemia in chronic kidney disease: Secondary | ICD-10-CM | POA: Diagnosis not present

## 2017-06-28 DIAGNOSIS — N2581 Secondary hyperparathyroidism of renal origin: Secondary | ICD-10-CM | POA: Diagnosis not present

## 2017-06-28 DIAGNOSIS — N186 End stage renal disease: Secondary | ICD-10-CM | POA: Diagnosis not present

## 2017-06-30 DIAGNOSIS — D631 Anemia in chronic kidney disease: Secondary | ICD-10-CM | POA: Diagnosis not present

## 2017-06-30 DIAGNOSIS — N2581 Secondary hyperparathyroidism of renal origin: Secondary | ICD-10-CM | POA: Diagnosis not present

## 2017-06-30 DIAGNOSIS — N186 End stage renal disease: Secondary | ICD-10-CM | POA: Diagnosis not present

## 2017-07-02 DIAGNOSIS — N2581 Secondary hyperparathyroidism of renal origin: Secondary | ICD-10-CM | POA: Diagnosis not present

## 2017-07-02 DIAGNOSIS — N186 End stage renal disease: Secondary | ICD-10-CM | POA: Diagnosis not present

## 2017-07-02 DIAGNOSIS — D631 Anemia in chronic kidney disease: Secondary | ICD-10-CM | POA: Diagnosis not present

## 2017-07-05 DIAGNOSIS — N186 End stage renal disease: Secondary | ICD-10-CM | POA: Diagnosis not present

## 2017-07-05 DIAGNOSIS — D631 Anemia in chronic kidney disease: Secondary | ICD-10-CM | POA: Diagnosis not present

## 2017-07-05 DIAGNOSIS — N2581 Secondary hyperparathyroidism of renal origin: Secondary | ICD-10-CM | POA: Diagnosis not present

## 2017-07-06 DIAGNOSIS — H16223 Keratoconjunctivitis sicca, not specified as Sjogren's, bilateral: Secondary | ICD-10-CM | POA: Diagnosis not present

## 2017-07-06 DIAGNOSIS — H2513 Age-related nuclear cataract, bilateral: Secondary | ICD-10-CM | POA: Diagnosis not present

## 2017-07-06 DIAGNOSIS — H401111 Primary open-angle glaucoma, right eye, mild stage: Secondary | ICD-10-CM | POA: Diagnosis not present

## 2017-07-07 DIAGNOSIS — N186 End stage renal disease: Secondary | ICD-10-CM | POA: Diagnosis not present

## 2017-07-07 DIAGNOSIS — D631 Anemia in chronic kidney disease: Secondary | ICD-10-CM | POA: Diagnosis not present

## 2017-07-07 DIAGNOSIS — N2581 Secondary hyperparathyroidism of renal origin: Secondary | ICD-10-CM | POA: Diagnosis not present

## 2017-07-09 DIAGNOSIS — N186 End stage renal disease: Secondary | ICD-10-CM | POA: Diagnosis not present

## 2017-07-09 DIAGNOSIS — D631 Anemia in chronic kidney disease: Secondary | ICD-10-CM | POA: Diagnosis not present

## 2017-07-09 DIAGNOSIS — N2581 Secondary hyperparathyroidism of renal origin: Secondary | ICD-10-CM | POA: Diagnosis not present

## 2017-07-12 DIAGNOSIS — N186 End stage renal disease: Secondary | ICD-10-CM | POA: Diagnosis not present

## 2017-07-12 DIAGNOSIS — N2581 Secondary hyperparathyroidism of renal origin: Secondary | ICD-10-CM | POA: Diagnosis not present

## 2017-07-12 DIAGNOSIS — D631 Anemia in chronic kidney disease: Secondary | ICD-10-CM | POA: Diagnosis not present

## 2017-07-14 DIAGNOSIS — N2581 Secondary hyperparathyroidism of renal origin: Secondary | ICD-10-CM | POA: Diagnosis not present

## 2017-07-14 DIAGNOSIS — D631 Anemia in chronic kidney disease: Secondary | ICD-10-CM | POA: Diagnosis not present

## 2017-07-14 DIAGNOSIS — N186 End stage renal disease: Secondary | ICD-10-CM | POA: Diagnosis not present

## 2017-07-16 DIAGNOSIS — N186 End stage renal disease: Secondary | ICD-10-CM | POA: Diagnosis not present

## 2017-07-16 DIAGNOSIS — D631 Anemia in chronic kidney disease: Secondary | ICD-10-CM | POA: Diagnosis not present

## 2017-07-16 DIAGNOSIS — N2581 Secondary hyperparathyroidism of renal origin: Secondary | ICD-10-CM | POA: Diagnosis not present

## 2017-07-19 DIAGNOSIS — N186 End stage renal disease: Secondary | ICD-10-CM | POA: Diagnosis not present

## 2017-07-19 DIAGNOSIS — Z992 Dependence on renal dialysis: Secondary | ICD-10-CM | POA: Diagnosis not present

## 2017-07-19 DIAGNOSIS — N2581 Secondary hyperparathyroidism of renal origin: Secondary | ICD-10-CM | POA: Diagnosis not present

## 2017-07-19 DIAGNOSIS — D631 Anemia in chronic kidney disease: Secondary | ICD-10-CM | POA: Diagnosis not present

## 2017-07-20 DIAGNOSIS — D631 Anemia in chronic kidney disease: Secondary | ICD-10-CM | POA: Diagnosis not present

## 2017-07-20 DIAGNOSIS — N186 End stage renal disease: Secondary | ICD-10-CM | POA: Diagnosis not present

## 2017-07-20 DIAGNOSIS — N2581 Secondary hyperparathyroidism of renal origin: Secondary | ICD-10-CM | POA: Diagnosis not present

## 2017-07-21 DIAGNOSIS — E119 Type 2 diabetes mellitus without complications: Secondary | ICD-10-CM | POA: Diagnosis not present

## 2017-07-21 DIAGNOSIS — D631 Anemia in chronic kidney disease: Secondary | ICD-10-CM | POA: Diagnosis not present

## 2017-07-21 DIAGNOSIS — N186 End stage renal disease: Secondary | ICD-10-CM | POA: Diagnosis not present

## 2017-07-21 DIAGNOSIS — N2581 Secondary hyperparathyroidism of renal origin: Secondary | ICD-10-CM | POA: Diagnosis not present

## 2017-07-23 DIAGNOSIS — D631 Anemia in chronic kidney disease: Secondary | ICD-10-CM | POA: Diagnosis not present

## 2017-07-23 DIAGNOSIS — N2581 Secondary hyperparathyroidism of renal origin: Secondary | ICD-10-CM | POA: Diagnosis not present

## 2017-07-23 DIAGNOSIS — N186 End stage renal disease: Secondary | ICD-10-CM | POA: Diagnosis not present

## 2017-07-26 ENCOUNTER — Encounter (HOSPITAL_COMMUNITY): Payer: Self-pay | Admitting: Emergency Medicine

## 2017-07-26 ENCOUNTER — Emergency Department (HOSPITAL_COMMUNITY)
Admission: EM | Admit: 2017-07-26 | Discharge: 2017-07-26 | Disposition: A | Discharge status: Home / Self Care | Payer: No Typology Code available for payment source | Attending: Emergency Medicine | Admitting: Emergency Medicine

## 2017-07-26 ENCOUNTER — Other Ambulatory Visit: Payer: Self-pay

## 2017-07-26 ENCOUNTER — Emergency Department (HOSPITAL_COMMUNITY): Payer: No Typology Code available for payment source

## 2017-07-26 DIAGNOSIS — Z041 Encounter for examination and observation following transport accident: Secondary | ICD-10-CM | POA: Diagnosis not present

## 2017-07-26 DIAGNOSIS — N186 End stage renal disease: Secondary | ICD-10-CM | POA: Diagnosis not present

## 2017-07-26 DIAGNOSIS — N2581 Secondary hyperparathyroidism of renal origin: Secondary | ICD-10-CM | POA: Diagnosis not present

## 2017-07-26 DIAGNOSIS — M79672 Pain in left foot: Secondary | ICD-10-CM | POA: Insufficient documentation

## 2017-07-26 DIAGNOSIS — M7912 Myalgia of auxiliary muscles, head and neck: Secondary | ICD-10-CM | POA: Insufficient documentation

## 2017-07-26 DIAGNOSIS — I5022 Chronic systolic (congestive) heart failure: Secondary | ICD-10-CM | POA: Diagnosis not present

## 2017-07-26 DIAGNOSIS — E119 Type 2 diabetes mellitus without complications: Secondary | ICD-10-CM | POA: Insufficient documentation

## 2017-07-26 DIAGNOSIS — S99922A Unspecified injury of left foot, initial encounter: Secondary | ICD-10-CM | POA: Diagnosis not present

## 2017-07-26 DIAGNOSIS — Z79899 Other long term (current) drug therapy: Secondary | ICD-10-CM | POA: Diagnosis not present

## 2017-07-26 DIAGNOSIS — Z794 Long term (current) use of insulin: Secondary | ICD-10-CM | POA: Diagnosis not present

## 2017-07-26 DIAGNOSIS — M7918 Myalgia, other site: Secondary | ICD-10-CM

## 2017-07-26 DIAGNOSIS — Z992 Dependence on renal dialysis: Secondary | ICD-10-CM | POA: Diagnosis not present

## 2017-07-26 DIAGNOSIS — D631 Anemia in chronic kidney disease: Secondary | ICD-10-CM | POA: Diagnosis not present

## 2017-07-26 DIAGNOSIS — I132 Hypertensive heart and chronic kidney disease with heart failure and with stage 5 chronic kidney disease, or end stage renal disease: Secondary | ICD-10-CM | POA: Diagnosis not present

## 2017-07-26 DIAGNOSIS — M549 Dorsalgia, unspecified: Secondary | ICD-10-CM | POA: Diagnosis not present

## 2017-07-26 DIAGNOSIS — M542 Cervicalgia: Secondary | ICD-10-CM | POA: Diagnosis not present

## 2017-07-26 MED ORDER — ACETAMINOPHEN 500 MG PO TABS: 1000.0000 mg | ORAL_TABLET | Freq: Three times a day (TID) | ORAL | 0 refills | Status: DC

## 2017-07-26 NOTE — Discharge Instructions (Addendum)
Take Tylenol 3 times a day for pain. Use muscle creams (bengay, icy hot, salonpas) for pain control.  Use heating pads for pain. You will likely have continued muscle stiffness and soreness over the next couple days.  Follow-up with primary care in 1 week if your symptoms are not improving. Return to the emergency room if you develop vision changes, vomiting, slurred speech, numbness, loss of bowel or bladder control, or any new or worsening symptoms.
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
## 2017-07-28 DIAGNOSIS — N2581 Secondary hyperparathyroidism of renal origin: Secondary | ICD-10-CM | POA: Diagnosis not present

## 2017-07-28 DIAGNOSIS — N186 End stage renal disease: Secondary | ICD-10-CM | POA: Diagnosis not present

## 2017-07-28 DIAGNOSIS — D631 Anemia in chronic kidney disease: Secondary | ICD-10-CM | POA: Diagnosis not present

## 2017-07-30 DIAGNOSIS — N186 End stage renal disease: Secondary | ICD-10-CM | POA: Diagnosis not present

## 2017-07-30 DIAGNOSIS — D631 Anemia in chronic kidney disease: Secondary | ICD-10-CM | POA: Diagnosis not present

## 2017-07-30 DIAGNOSIS — N2581 Secondary hyperparathyroidism of renal origin: Secondary | ICD-10-CM | POA: Diagnosis not present

## 2017-08-01 DIAGNOSIS — I5032 Chronic diastolic (congestive) heart failure: Secondary | ICD-10-CM | POA: Diagnosis not present

## 2017-08-01 DIAGNOSIS — M1990 Unspecified osteoarthritis, unspecified site: Secondary | ICD-10-CM | POA: Diagnosis not present

## 2017-08-01 DIAGNOSIS — Z79899 Other long term (current) drug therapy: Secondary | ICD-10-CM | POA: Diagnosis not present

## 2017-08-01 DIAGNOSIS — R55 Syncope and collapse: Secondary | ICD-10-CM | POA: Diagnosis not present

## 2017-08-01 DIAGNOSIS — E1140 Type 2 diabetes mellitus with diabetic neuropathy, unspecified: Secondary | ICD-10-CM | POA: Diagnosis not present

## 2017-08-01 DIAGNOSIS — M25569 Pain in unspecified knee: Secondary | ICD-10-CM | POA: Diagnosis not present

## 2017-08-01 DIAGNOSIS — E7800 Pure hypercholesterolemia, unspecified: Secondary | ICD-10-CM | POA: Diagnosis not present

## 2017-08-01 DIAGNOSIS — E875 Hyperkalemia: Secondary | ICD-10-CM | POA: Diagnosis not present

## 2017-08-01 DIAGNOSIS — N186 End stage renal disease: Secondary | ICD-10-CM | POA: Diagnosis not present

## 2017-08-01 DIAGNOSIS — M159 Polyosteoarthritis, unspecified: Secondary | ICD-10-CM | POA: Diagnosis not present

## 2017-08-01 DIAGNOSIS — L905 Scar conditions and fibrosis of skin: Secondary | ICD-10-CM | POA: Diagnosis not present

## 2017-08-01 DIAGNOSIS — Z79891 Long term (current) use of opiate analgesic: Secondary | ICD-10-CM | POA: Diagnosis not present

## 2017-08-02 DIAGNOSIS — N2581 Secondary hyperparathyroidism of renal origin: Secondary | ICD-10-CM | POA: Diagnosis not present

## 2017-08-02 DIAGNOSIS — N186 End stage renal disease: Secondary | ICD-10-CM | POA: Diagnosis not present

## 2017-08-02 DIAGNOSIS — D631 Anemia in chronic kidney disease: Secondary | ICD-10-CM | POA: Diagnosis not present

## 2017-08-04 DIAGNOSIS — D631 Anemia in chronic kidney disease: Secondary | ICD-10-CM | POA: Diagnosis not present

## 2017-08-04 DIAGNOSIS — N186 End stage renal disease: Secondary | ICD-10-CM | POA: Diagnosis not present

## 2017-08-04 DIAGNOSIS — N2581 Secondary hyperparathyroidism of renal origin: Secondary | ICD-10-CM | POA: Diagnosis not present

## 2017-08-06 DIAGNOSIS — D631 Anemia in chronic kidney disease: Secondary | ICD-10-CM | POA: Diagnosis not present

## 2017-08-06 DIAGNOSIS — N186 End stage renal disease: Secondary | ICD-10-CM | POA: Diagnosis not present

## 2017-08-06 DIAGNOSIS — N2581 Secondary hyperparathyroidism of renal origin: Secondary | ICD-10-CM | POA: Diagnosis not present

## 2017-08-09 DIAGNOSIS — N186 End stage renal disease: Secondary | ICD-10-CM | POA: Diagnosis not present

## 2017-08-09 DIAGNOSIS — N2581 Secondary hyperparathyroidism of renal origin: Secondary | ICD-10-CM | POA: Diagnosis not present

## 2017-08-09 DIAGNOSIS — D631 Anemia in chronic kidney disease: Secondary | ICD-10-CM | POA: Diagnosis not present

## 2017-08-11 DIAGNOSIS — N2581 Secondary hyperparathyroidism of renal origin: Secondary | ICD-10-CM | POA: Diagnosis not present

## 2017-08-11 DIAGNOSIS — N186 End stage renal disease: Secondary | ICD-10-CM | POA: Diagnosis not present

## 2017-08-11 DIAGNOSIS — D631 Anemia in chronic kidney disease: Secondary | ICD-10-CM | POA: Diagnosis not present

## 2017-08-13 DIAGNOSIS — N2581 Secondary hyperparathyroidism of renal origin: Secondary | ICD-10-CM | POA: Diagnosis not present

## 2017-08-13 DIAGNOSIS — N186 End stage renal disease: Secondary | ICD-10-CM | POA: Diagnosis not present

## 2017-08-13 DIAGNOSIS — D631 Anemia in chronic kidney disease: Secondary | ICD-10-CM | POA: Diagnosis not present

## 2017-08-16 DIAGNOSIS — D631 Anemia in chronic kidney disease: Secondary | ICD-10-CM | POA: Diagnosis not present

## 2017-08-16 DIAGNOSIS — N2581 Secondary hyperparathyroidism of renal origin: Secondary | ICD-10-CM | POA: Diagnosis not present

## 2017-08-16 DIAGNOSIS — N186 End stage renal disease: Secondary | ICD-10-CM | POA: Diagnosis not present

## 2017-08-18 DIAGNOSIS — N2581 Secondary hyperparathyroidism of renal origin: Secondary | ICD-10-CM | POA: Diagnosis not present

## 2017-08-18 DIAGNOSIS — N186 End stage renal disease: Secondary | ICD-10-CM | POA: Diagnosis not present

## 2017-08-18 DIAGNOSIS — D631 Anemia in chronic kidney disease: Secondary | ICD-10-CM | POA: Diagnosis not present

## 2017-08-19 ENCOUNTER — Encounter (INDEPENDENT_AMBULATORY_CARE_PROVIDER_SITE_OTHER): Payer: Medicare Other | Admitting: Ophthalmology

## 2017-08-19 DIAGNOSIS — N186 End stage renal disease: Secondary | ICD-10-CM | POA: Diagnosis not present

## 2017-08-19 DIAGNOSIS — Z992 Dependence on renal dialysis: Secondary | ICD-10-CM | POA: Diagnosis not present

## 2017-08-20 DIAGNOSIS — N186 End stage renal disease: Secondary | ICD-10-CM | POA: Diagnosis not present

## 2017-08-20 DIAGNOSIS — N2581 Secondary hyperparathyroidism of renal origin: Secondary | ICD-10-CM | POA: Diagnosis not present

## 2017-08-20 DIAGNOSIS — D631 Anemia in chronic kidney disease: Secondary | ICD-10-CM | POA: Diagnosis not present

## 2017-08-23 DIAGNOSIS — D631 Anemia in chronic kidney disease: Secondary | ICD-10-CM | POA: Diagnosis not present

## 2017-08-23 DIAGNOSIS — N186 End stage renal disease: Secondary | ICD-10-CM | POA: Diagnosis not present

## 2017-08-23 DIAGNOSIS — N2581 Secondary hyperparathyroidism of renal origin: Secondary | ICD-10-CM | POA: Diagnosis not present

## 2017-08-24 DIAGNOSIS — N186 End stage renal disease: Secondary | ICD-10-CM | POA: Diagnosis not present

## 2017-08-24 DIAGNOSIS — N2581 Secondary hyperparathyroidism of renal origin: Secondary | ICD-10-CM | POA: Diagnosis not present

## 2017-08-24 DIAGNOSIS — D631 Anemia in chronic kidney disease: Secondary | ICD-10-CM | POA: Diagnosis not present

## 2017-08-25 DIAGNOSIS — N2581 Secondary hyperparathyroidism of renal origin: Secondary | ICD-10-CM | POA: Diagnosis not present

## 2017-08-25 DIAGNOSIS — E119 Type 2 diabetes mellitus without complications: Secondary | ICD-10-CM | POA: Diagnosis not present

## 2017-08-25 DIAGNOSIS — D631 Anemia in chronic kidney disease: Secondary | ICD-10-CM | POA: Diagnosis not present

## 2017-08-25 DIAGNOSIS — N186 End stage renal disease: Secondary | ICD-10-CM | POA: Diagnosis not present

## 2017-08-26 ENCOUNTER — Encounter (INDEPENDENT_AMBULATORY_CARE_PROVIDER_SITE_OTHER): Payer: Medicare Other | Admitting: Ophthalmology

## 2017-08-26 DIAGNOSIS — H43813 Vitreous degeneration, bilateral: Secondary | ICD-10-CM

## 2017-08-26 DIAGNOSIS — E113591 Type 2 diabetes mellitus with proliferative diabetic retinopathy without macular edema, right eye: Secondary | ICD-10-CM | POA: Diagnosis not present

## 2017-08-26 DIAGNOSIS — E113522 Type 2 diabetes mellitus with proliferative diabetic retinopathy with traction retinal detachment involving the macula, left eye: Secondary | ICD-10-CM

## 2017-08-26 DIAGNOSIS — E11311 Type 2 diabetes mellitus with unspecified diabetic retinopathy with macular edema: Secondary | ICD-10-CM | POA: Diagnosis not present

## 2017-08-26 DIAGNOSIS — H2513 Age-related nuclear cataract, bilateral: Secondary | ICD-10-CM | POA: Diagnosis not present

## 2017-08-27 DIAGNOSIS — D631 Anemia in chronic kidney disease: Secondary | ICD-10-CM | POA: Diagnosis not present

## 2017-08-27 DIAGNOSIS — N186 End stage renal disease: Secondary | ICD-10-CM | POA: Diagnosis not present

## 2017-08-27 DIAGNOSIS — N2581 Secondary hyperparathyroidism of renal origin: Secondary | ICD-10-CM | POA: Diagnosis not present

## 2017-08-30 DIAGNOSIS — N2581 Secondary hyperparathyroidism of renal origin: Secondary | ICD-10-CM | POA: Diagnosis not present

## 2017-08-30 DIAGNOSIS — D631 Anemia in chronic kidney disease: Secondary | ICD-10-CM | POA: Diagnosis not present

## 2017-08-30 DIAGNOSIS — N186 End stage renal disease: Secondary | ICD-10-CM | POA: Diagnosis not present

## 2017-09-01 DIAGNOSIS — N2581 Secondary hyperparathyroidism of renal origin: Secondary | ICD-10-CM | POA: Diagnosis not present

## 2017-09-01 DIAGNOSIS — D631 Anemia in chronic kidney disease: Secondary | ICD-10-CM | POA: Diagnosis not present

## 2017-09-01 DIAGNOSIS — N186 End stage renal disease: Secondary | ICD-10-CM | POA: Diagnosis not present

## 2017-09-03 DIAGNOSIS — D631 Anemia in chronic kidney disease: Secondary | ICD-10-CM | POA: Diagnosis not present

## 2017-09-03 DIAGNOSIS — N2581 Secondary hyperparathyroidism of renal origin: Secondary | ICD-10-CM | POA: Diagnosis not present

## 2017-09-03 DIAGNOSIS — N186 End stage renal disease: Secondary | ICD-10-CM | POA: Diagnosis not present

## 2017-09-05 ENCOUNTER — Ambulatory Visit (INDEPENDENT_AMBULATORY_CARE_PROVIDER_SITE_OTHER): Payer: Medicare Other | Admitting: Physician Assistant

## 2017-09-05 ENCOUNTER — Encounter: Payer: Self-pay | Admitting: Physician Assistant

## 2017-09-05 ENCOUNTER — Other Ambulatory Visit: Payer: Self-pay

## 2017-09-05 VITALS — BP 94/54 | HR 94 | Temp 97.9°F | Ht 65.0 in | Wt 243.0 lb

## 2017-09-05 DIAGNOSIS — L089 Local infection of the skin and subcutaneous tissue, unspecified: Secondary | ICD-10-CM | POA: Diagnosis not present

## 2017-09-05 MED ORDER — TRAMADOL HCL 50 MG PO TABS: 25.0000 mg | ORAL_TABLET | Freq: Two times a day (BID) | ORAL | 0 refills | Status: DC

## 2017-09-05 MED ORDER — CEPHALEXIN 125 MG/5ML PO SUSR: 250.0000 mg | Freq: Two times a day (BID) | ORAL | 0 refills | Status: DC

## 2017-09-05 NOTE — Progress Notes (Signed)
09/05/2017 10:34 AM   DOB: Mar 13, 1966 / MRN: [id number]  SUBJECTIVE:  [name] [name] is a 52 y.o. male presenting for left pointer finger pain that started about a week ago and is gradually worsening.  Tells me that he feels like he has pain radiating up into the distal palm which emanates from the affected finger.  He has a "spot" on the palmar aspect of the distal second digit is not sure if this is a bite or not.  He denies itching.  The "spot" preceeds the finger pain.  Denies any weakness, history of gout.  Has a history of ESRD on dialysis and does not miss sessions.  He has No Known Allergies.   He  has a past medical history of Anal infection, Anemia, chronic renal failure, Atrophic kidney, DM type 2 causing ESRD ([place]), Hemodialysis patient ([place]), Hemorrhoids, Hepatitis B antibody positive, History of pleural effusion, Hyperparathyroidism, secondary renal ([place]), Hypertension, Ischemic cardiomyopathy, LAFB (left anterior fascicular block), Peripheral neuropathy, and Systolic and diastolic CHF, chronic ([place]).    He  reports that he has never smoked. He has never used smokeless tobacco. He reports that he does not drink alcohol or use drugs. He  has no sexual activity history on file. The patient  has a past surgical history that includes Appendectomy (09-12-2004); AV fistula placement (02-27-2010); AV fistula repair (10-30-2010); Cardiovascular stress test (10-29-2011   dr Daneen Schick); Dobutamine stress echo (07-23-2012   [facility]); transthoracic echocardiogram (07-01-2014    done at [facility]); Retinal detachment surgery (Left, 2011); Incision and drainage abscess (N/A, 03/26/2015); and Fistulotomy (N/A, 03/26/2015).  His Family history is unknown by patient.  Review of Systems  Constitutional: Negative for chills, diaphoresis, fever, malaise/fatigue and weight loss.  Respiratory: Negative for cough and shortness of breath.   Cardiovascular: Negative for chest pain and leg  swelling.  Gastrointestinal: Negative for nausea.  Skin: Negative for rash.  Neurological: Negative for dizziness.    The problem list and medications were reviewed and updated by myself where necessary and exist elsewhere in the encounter.   OBJECTIVE:  BP (!) 94/54 (BP Location: Left Arm, Patient Position: Sitting, Cuff Size: Large)   Pulse 94   Temp 97.9 F (36.6 C) (Oral)   Ht 5\' 5"  (1.651 m)   Wt 243 lb (110.2 kg)   SpO2 94%   BMI 40.44 kg/m   Wt Readings from Last 3 Encounters:  09/05/17 243 lb (110.2 kg)  01/19/17 240 lb 3.2 oz (109 kg)  03/14/16 239 lb 6.4 oz (108.6 kg)   Temp Readings from Last 3 Encounters:  09/05/17 97.9 F (36.6 C) (Oral)  07/26/17 98.1 F (36.7 C) (Oral)  03/14/16 98.6 F (37 C) (Oral)   BP Readings from Last 3 Encounters:  09/05/17 (!) 94/54  07/26/17 100/64  03/14/16 (!) 90/47   Pulse Readings from Last 3 Encounters:  09/05/17 94  07/26/17 (!) 106  01/19/17 (!) 108   Lab Results  Component Value Date   CREATININE 8.55 (H) 03/14/2016   CREATININE 8.69 (H) 03/14/2016     Physical Exam  Constitutional: He is oriented to person, place, and time. He appears well-developed. He does not appear ill.  Eyes: Pupils are equal, round, and reactive to light. Conjunctivae and EOM are normal.  Cardiovascular: Normal rate.  Pulmonary/Chest: Effort normal.  Abdominal: He exhibits no distension.  Musculoskeletal: Normal range of motion.       Hands: Neurological: He is alert and oriented to  person, place, and time. No cranial nerve deficit. Coordination normal.  Skin: Skin is warm and dry. He is not diaphoretic.  Psychiatric: He has a normal mood and affect.  Nursing note and vitals reviewed.   No results found for this or any previous visit (from the past 72 hour(s)).  No results found.  ASSESSMENT AND PLAN:  Kathryn was seen today for insect bite.  Diagnoses and all orders for this visit:  Finger infection: Appears he has a wart  on his finger and surrounding skin has become infected clinically.  He is ESRD and has a regularly scheduled dialysis session tomorrow.  I am starting him on Keflex renally dosed along with tramadol renally dosed.  He will take his medicines to dialysis tomorrow to make them aware that he will be taking these for the next 10 or so days.  I will see the patient back in 10 days to reassess the finger or sooner if his symptoms worsen. -     cephALEXin (KEFLEX) 125 MG/5ML suspension; Take 10 mLs (250 mg total) by mouth 2 (two) times daily. -     traMADol (ULTRAM) 50 MG tablet; Take 0.5-1 tablets (25-50 mg total) by mouth 2 (two) times daily.    The patient is advised to call or return to clinic if he does not see an improvement in symptoms, or to seek the care of the closest emergency department if he worsens with the above plan.   Philis Fendt, MHS, PA-C [facility] 09/05/2017 10:34 AM

## 2017-09-05 NOTE — Patient Instructions (Addendum)
  Come back in about 1 week so I can reassess the finger after/while taking antibiotics.  Please take your new presciptions to dialysis tomorrow so they are aware of them.    IF you received an x-ray today, you will receive an invoice from [facility]. Please contact [facility] at [phone] with questions or concerns regarding your invoice.   IF you received labwork today, you will receive an invoice from [place]. Please contact [facility] at [phone] with questions or concerns regarding your invoice.   Our billing staff will not be able to assist you with questions regarding bills from these companies.  You will be contacted with the lab results as soon as they are available. The fastest way to get your results is to activate your My Chart account. Instructions are located on the last page of this paperwork. If you have not heard from [place] regarding the results in 2 weeks, please contact this office.        IF you received an x-ray today, you will receive an invoice from [facility]. Please contact [facility] at [phone] with questions or concerns regarding your invoice.   IF you received labwork today, you will receive an invoice from [place]. Please contact [facility] at [phone] with questions or concerns regarding your invoice.   Our billing staff will not be able to assist you with questions regarding bills from these companies.  You will be contacted with the lab results as soon as they are available. The fastest way to get your results is to activate your My Chart account. Instructions are located on the last page of this paperwork. If you have not heard from [place] regarding the results in 2 weeks, please contact this office.

## 2017-09-06 DIAGNOSIS — N2581 Secondary hyperparathyroidism of renal origin: Secondary | ICD-10-CM | POA: Diagnosis not present

## 2017-09-06 DIAGNOSIS — D631 Anemia in chronic kidney disease: Secondary | ICD-10-CM | POA: Diagnosis not present

## 2017-09-06 DIAGNOSIS — N186 End stage renal disease: Secondary | ICD-10-CM | POA: Diagnosis not present

## 2017-09-08 DIAGNOSIS — D631 Anemia in chronic kidney disease: Secondary | ICD-10-CM | POA: Diagnosis not present

## 2017-09-08 DIAGNOSIS — N2581 Secondary hyperparathyroidism of renal origin: Secondary | ICD-10-CM | POA: Diagnosis not present

## 2017-09-08 DIAGNOSIS — N186 End stage renal disease: Secondary | ICD-10-CM | POA: Diagnosis not present

## 2017-09-10 DIAGNOSIS — D631 Anemia in chronic kidney disease: Secondary | ICD-10-CM | POA: Diagnosis not present

## 2017-09-10 DIAGNOSIS — N186 End stage renal disease: Secondary | ICD-10-CM | POA: Diagnosis not present

## 2017-09-10 DIAGNOSIS — N2581 Secondary hyperparathyroidism of renal origin: Secondary | ICD-10-CM | POA: Diagnosis not present

## 2017-09-12 ENCOUNTER — Ambulatory Visit (INDEPENDENT_AMBULATORY_CARE_PROVIDER_SITE_OTHER): Payer: Medicare Other | Admitting: Physician Assistant

## 2017-09-12 ENCOUNTER — Encounter: Payer: Self-pay | Admitting: Physician Assistant

## 2017-09-12 VITALS — BP 83/32 | HR 97 | Temp 98.0°F | Resp 18 | Ht 65.0 in | Wt 244.0 lb

## 2017-09-12 DIAGNOSIS — L089 Local infection of the skin and subcutaneous tissue, unspecified: Secondary | ICD-10-CM

## 2017-09-12 MED ORDER — CEPHALEXIN 125 MG/5ML PO SUSR: 250.0000 mg | Freq: Two times a day (BID) | ORAL | 0 refills | Status: DC

## 2017-09-12 MED ORDER — TRAMADOL HCL 50 MG PO TABS: 25.0000 mg | ORAL_TABLET | Freq: Three times a day (TID) | ORAL | 0 refills | Status: DC

## 2017-09-12 NOTE — Patient Instructions (Addendum)
Continue keflex and tramadol.  Come back if you are having problems.      IF you received an x-ray today, you will receive an invoice from [facility]. Please contact [facility] at [phone] with questions or concerns regarding your invoice.   IF you received labwork today, you will receive an invoice from [place]. Please contact [facility] at [phone] with questions or concerns regarding your invoice.   Our billing staff will not be able to assist you with questions regarding bills from these companies.  You will be contacted with the lab results as soon as they are available. The fastest way to get your results is to activate your My Chart account. Instructions are located on the last page of this paperwork. If you have not heard from [place] regarding the results in 2 weeks, please contact this office.

## 2017-09-12 NOTE — Progress Notes (Signed)
09/12/2017 3:25 PM   DOB: 24-Sep-1965 / MRN: [id number]  SUBJECTIVE:  [name] [name] is a well-appearing 52 y.o. male presenting for finger pain left index. Symptoms present for 2 weeks.  The problem is improving. He has tried keflex and tramadol.    He has No Known Allergies.   He  has a past medical history of Anal infection, Anemia, chronic renal failure, Atrophic kidney, DM type 2 causing ESRD ([place]), Hemodialysis patient ([place]), Hemorrhoids, Hepatitis B antibody positive, History of pleural effusion, Hyperparathyroidism, secondary renal ([place]), Hypertension, Ischemic cardiomyopathy, LAFB (left anterior fascicular block), Peripheral neuropathy, and Systolic and diastolic CHF, chronic ([place]).    He  reports that he has never smoked. He has never used smokeless tobacco. He reports that he does not drink alcohol or use drugs. He  has no sexual activity history on file. The patient  has a past surgical history that includes Appendectomy (09-12-2004); AV fistula placement (02-27-2010); AV fistula repair (10-30-2010); Cardiovascular stress test (10-29-2011   dr Daneen Schick); Dobutamine stress echo (07-23-2012   [facility]); transthoracic echocardiogram (07-01-2014    done at [facility]); Retinal detachment surgery (Left, 2011); Incision and drainage abscess (N/A, 03/26/2015); and Fistulotomy (N/A, 03/26/2015).  His Family history is unknown by patient.  Review of Systems  Constitutional: Negative for chills, diaphoresis and fever.  Eyes: Negative.   Respiratory: Negative for cough, hemoptysis, sputum production, shortness of breath and wheezing.   Cardiovascular: Negative for chest pain, orthopnea and leg swelling.  Gastrointestinal: Negative for abdominal pain, blood in stool, constipation, diarrhea, heartburn, melena, nausea and vomiting.  Genitourinary: Negative for dysuria, flank pain, frequency, hematuria and urgency.  Skin: Negative for rash.  Neurological: Negative for dizziness,  sensory change, speech change, focal weakness and headaches.    The problem list and medications were reviewed and updated by myself where necessary and exist elsewhere in the encounter.   OBJECTIVE:  BP (!) 83/32   Pulse 97   Temp 98 F (36.7 C) (Oral)   Resp 18   Ht 5\' 5"  (1.651 m)   Wt 244 lb (110.7 kg)   SpO2 97%   BMI 40.60 kg/m    Wt Readings from Last 3 Encounters:  09/12/17 244 lb (110.7 kg)  09/05/17 243 lb (110.2 kg)  01/19/17 240 lb 3.2 oz (109 kg)   Temp Readings from Last 3 Encounters:  09/12/17 98 F (36.7 C) (Oral)  09/05/17 97.9 F (36.6 C) (Oral)  07/26/17 98.1 F (36.7 C) (Oral)   BP Readings from Last 3 Encounters:  09/12/17 (!) 83/32  09/05/17 (!) 94/54  07/26/17 100/64   Pulse Readings from Last 3 Encounters:  09/12/17 97  09/05/17 94  07/26/17 (!) 106    Physical Exam  Constitutional: He is oriented to person, place, and time. He appears well-developed. He does not appear ill.  Eyes: Pupils are equal, round, and reactive to light. Conjunctivae and EOM are normal.  Cardiovascular: Normal rate.  Pulmonary/Chest: Effort normal.  Abdominal: He exhibits no distension.  Musculoskeletal: Normal range of motion.       Hands: Neurological: He is alert and oriented to person, place, and time. No cranial nerve deficit. Coordination normal.  Skin: Skin is warm and dry. He is not diaphoretic.  Psychiatric: He has a normal mood and affect.  Nursing note and vitals reviewed.  Risk and benefits discussed and verbal consent obtained. Anesthetic allergies reviewed. Patient anesthetized using 1:1 mix of 2% lidocaine without epi. A 1 cm  incision was made using a number 11 blade and purulent material was expressed.  The was not wound packed. The patient tolerated the procedure without difficulty.   A clean dressing was placed and wound care instructions were provided.    Lab Results  Component Value Date   HGBA1C 10.7 (H) 03/13/2016    Lab Results    Component Value Date   WBC 7.4 03/14/2016   HGB 16.4 03/14/2016   HCT 50.1 03/14/2016   MCV 110.8 (H) 03/14/2016   PLT 126 (L) 03/14/2016    Lab Results  Component Value Date   CREATININE 8.55 (H) 03/14/2016   CREATININE 8.69 (H) 03/14/2016   BUN 16 03/14/2016   BUN 15 03/14/2016   NA 136 03/14/2016   NA 138 03/14/2016   K 4.2 03/14/2016   K 4.1 03/14/2016   CL 98 (L) 03/14/2016   CL 98 (L) 03/14/2016   CO2 21 (L) 03/14/2016   CO2 24 03/14/2016    Lab Results  Component Value Date   ALT 12 (L) 03/14/2016   AST 14 (L) 03/14/2016   ALKPHOS 132 (H) 03/14/2016   BILITOT 0.8 03/14/2016    Lab Results  Component Value Date   TSH 0.263 (L) 03/13/2016    No results found for: CHOL, HDL, LDLCALC, LDLDIRECT, TRIG, CHOLHDL   ASSESSMENT AND PLAN:  [name] [name] was seen today for hand pain.  Diagnoses and all orders for this visit:  Finger infection: Patient has developed a paronychia since I've last seen him. I have drained this. Advised warm compress.  Continue keflex and tramadol. RTC as needed.   -     WOUND CULTURE    The patient is advised to call or return to clinic if he does not see an improvement in symptoms, or to seek the care of the closest emergency department if he worsens with the above plan.   [place] Fendt, MHS, PA-C [facility] 09/12/2017 3:25 PM

## 2017-09-13 DIAGNOSIS — N2581 Secondary hyperparathyroidism of renal origin: Secondary | ICD-10-CM | POA: Diagnosis not present

## 2017-09-13 DIAGNOSIS — N186 End stage renal disease: Secondary | ICD-10-CM | POA: Diagnosis not present

## 2017-09-13 DIAGNOSIS — D631 Anemia in chronic kidney disease: Secondary | ICD-10-CM | POA: Diagnosis not present

## 2017-09-14 ENCOUNTER — Encounter (INDEPENDENT_AMBULATORY_CARE_PROVIDER_SITE_OTHER): Payer: Self-pay | Admitting: Orthopedic Surgery

## 2017-09-14 ENCOUNTER — Ambulatory Visit (INDEPENDENT_AMBULATORY_CARE_PROVIDER_SITE_OTHER): Payer: Medicare Other | Admitting: Orthopedic Surgery

## 2017-09-14 ENCOUNTER — Ambulatory Visit (INDEPENDENT_AMBULATORY_CARE_PROVIDER_SITE_OTHER): Payer: Self-pay

## 2017-09-14 DIAGNOSIS — M25561 Pain in right knee: Secondary | ICD-10-CM

## 2017-09-14 DIAGNOSIS — M25562 Pain in left knee: Secondary | ICD-10-CM

## 2017-09-14 DIAGNOSIS — G8929 Other chronic pain: Secondary | ICD-10-CM

## 2017-09-14 DIAGNOSIS — M5441 Lumbago with sciatica, right side: Secondary | ICD-10-CM | POA: Diagnosis not present

## 2017-09-14 LAB — WOUND CULTURE: Organism ID, Bacteria: NONE SEEN

## 2017-09-14 MED ORDER — LIDOCAINE HCL 1 % IJ SOLN
5.0000 mL | INTRAMUSCULAR | Status: AC | PRN
  Administered 2017-09-14: 5 mL

## 2017-09-14 MED ORDER — BUPIVACAINE HCL 0.25 % IJ SOLN
4.0000 mL | INTRAMUSCULAR | Status: AC | PRN
Start: 2017-09-14 — End: 2017-09-14
  Administered 2017-09-14: 4 mL via INTRA_ARTICULAR

## 2017-09-14 NOTE — Progress Notes (Signed)
Office Visit Note   Patient: [name] [name]           Date of Birth: [birth year]           MRN: [id number] Visit Date: 09/14/2017 Requested by: [name], MD [address] [place], [place] [address] PCP: [name], MD  Subjective: Chief Complaint  Patient presents with  . Left Knee - Pain  . Right Knee - Pain  . Lower Back - Pain    HPI: Alon is a 52 year old patient with bilateral knee pain left worse than right which is been going on for 3 months.  Denies any history of injury.  He does report restless leg type syndrome.  He did report some weakness and giving way but no discrete locking popping or swelling.  He also reports some low back pain with a little radiation into the thigh.  He thinks this is probably more likely related to his failed kidney transplant surgery.  Does report numbness and tingling in the feet consistent with known diagnosis of neuropathy.  Takes Norco from his primary care provider.  He is on dialysis 3 times a week.  He has kidney failure due to diabetes.  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  Patient denies  fevers or chills.   Assessment & Plan: Visit Diagnoses:  1. Chronic pain of both knees   2. Chronic right-sided low back pain with right-sided sciatica     Plan: Impression is bilateral knee pain with normal radiographs normal exam no effusion and no focal joint line tenderness.  I will try a cortisone injection but that will likely make his blood glucose elevated.  We will try Toradol injection instead to prior to press the reset button on any inflammation in the knee that is giving him symptoms.  Would not consider MRI scanning at this point based on his examination and symptoms.  See him back as needed.  The back looks pretty asymptomatic at this time based on exam and  radiographs.  Follow-Up Instructions: No follow-ups on file.   Orders:  Orders Placed This Encounter  Procedures  . XR Lumbar Spine 2-3 Views  . XR KNEE 3 VIEW RIGHT  . XR KNEE 3 VIEW LEFT   No orders of the defined types were placed in this encounter.     Procedures: Large Joint Inj on 09/14/2017 11:11 AM Indications: diagnostic evaluation, joint swelling and pain Details: 18 G 1.5 in needle, superolateral approach  Arthrogram: No  Medications: 5 mL lidocaine 1 %; 4 mL bupivacaine 0.25 % Outcome: tolerated well, no immediate complications Procedure, treatment alternatives, risks and benefits explained, specific risks discussed. Consent was given by the patient. Immediately prior to procedure a time out was called to verify the correct patient, procedure, equipment, support staff and site/side marked as required. Patient was prepped and draped in the usual sterile fashion.     30 mg Toradol also injected into the knee  Clinical Data: No additional findings.  Objective: Vital Signs: There were no vitals taken for this visit.  Physical Exam:   Constitutional: Patient appears well-developed HEENT:  Head: Normocephalic Eyes:EOM are normal Neck: Normal range of motion Cardiovascular: Normal rate Pulmonary/chest: Effort normal Neurologic: Patient is alert Skin: Skin is warm Psychiatric: Patient has normal mood and  affect    Ortho Exam: Ortho exam demonstrates normal gait alignment palpable pedal pulses bilaterally.  No knee effusion full range of motion in both knees.  Collateral and cruciate ligaments are stable with intact extensor mechanism in both knees.  There are no masses lymphadenopathy or skin changes noted in that knee region.  Range of motion is full without patellofemoral crepitus in both knees.  Specialty Comments:  No specialty comments available.  Imaging: Xr Knee 3 View Left  Result Date: 09/14/2017 AP lateral merchant left knee reviewed.  No  significant joint space narrowing or arthritis is present in the medial lateral or patellofemoral compartment.  Calcification of the vessels is present posteriorly.  No fracture or dislocation present.  Xr Knee 3 View Right  Result Date: 09/14/2017 AP lateral merchant right knee reviewed.  Mild varus alignment present.  Some vessel calcification is noted in the lower extremities bilaterally.  No significant spurring or joint space narrowing in the medial lateral or patellofemoral compartment.  Xr Lumbar Spine 2-3 Views  Result Date: 09/14/2017 AP lateral lumbar spine reviewed.  There is no spondylolisthesis or compression fractures.  Disc spaces are well-maintained.  Minimal facet arthritis present.  Hip joints appear to have mild arthritis without significant joint space narrowing.  Sacroiliac joints mild sclerosis more on the right than the left    PMFS History: Patient Active Problem List   Diagnosis Date Noted  . Acute encephalopathy 03/13/2016  . Acute hyperkalemia 03/13/2016  . Diabetes mellitus type 2, uncontrolled ([place]) 05/03/2014  . Non-ischemic cardiomyopathy ([place]) 05/03/2014  . HYPERCHOLESTEROLEMIA 08/19/2009  . Essential hypertension 08/19/2009  . Chronic systolic heart failure ([place]) 08/19/2009  . CKD (chronic kidney disease) stage V requiring chronic dialysis ([place]) 08/19/2009  . Depression 08/19/2009   Past Medical History:  Diagnosis Date  . Anal infection    posterior anal canal  . Anemia, chronic renal failure   . Atrophic kidney    BILATERAL  . DM type 2 causing ESRD [facility])    Nephrologist-- dr [name] [facility])--  on hemodialysis since June 2012 at  [name]  TTS  . Hemodialysis patient [facility])    at [place] on Tues/ Thur/Sat/schedule  . Hemorrhoids   . Hepatitis B antibody positive   . History of pleural effusion    bilateral  . Hyperparathyroidism, secondary renal ([place])   . Hypertension   . Ischemic cardiomyopathy    per echo  07-01-2014  ef 45%  . LAFB (left anterior fascicular block)   . Peripheral neuropathy   . Systolic and diastolic CHF, chronic ([place])    CARDIOLOGIST-  DR Daneen Schick ([place])  AND DR [name] ([name])    Family History  Family history unknown: Yes    Past Surgical History:  Procedure Laterality Date  . APPENDECTOMY  09-12-2004   laparotomy w/ drainage peritinitis  . AV FISTULA PLACEMENT  02-27-2010   right forearm (RADIOCEPHALIC)  . AV FISTULA REPAIR  10-30-2010  . CARDIOVASCULAR STRESS TEST  10-29-2011   dr Daneen Schick   Low risk scan;  mild perfusion defect seen in the basal inferoseptal, basal inferior and mid inferior regions consistent with an infarct/scar and/or overlying attenuation/  mild to moderate global LVSF,  ef 40-45%  . DOBUTAMINE STRESS ECHO  07-23-2012   [name]   abnormal ;  at rest estimated lvef 25-30% and global severe LV hypokinesis ;  no cp during stress and achieved 85% maxium predicted heart  rate;  negative stress ECG for inducible ischemia;  estimated lvef with stress 35-40%;  augmentation of wall segments consistant with cardiomyopathy and differential fibrosis  . FISTULOTOMY N/A 03/26/2015   Procedure: FISTULOTOMY;  Surgeon: [name], MD;  Location: [facility];  Service: General;  Laterality: N/A;  . INCISION AND DRAINAGE ABSCESS N/A 03/26/2015   Procedure: ANAL INCISION AND DRAINAGE;  Surgeon: [name], MD;  Location: [place];  Service: General;  Laterality: N/A;  . RETINAL DETACHMENT SURGERY Left 2011   incomplete repair/ needs eye drops to keep pressure down  . TRANSTHORACIC ECHOCARDIOGRAM  07-01-2014    done at [facility]   grade 1 diastolic dysfunction,  ef 45%/  trace TR and PR   Social History   Occupational History  . Not on file  Tobacco Use  . Smoking status: Never Smoker  . Smokeless tobacco: Never Used  Substance and Sexual Activity  . Alcohol use: No   . Drug use: No  . Sexual activity: Not on file

## 2017-09-15 DIAGNOSIS — N186 End stage renal disease: Secondary | ICD-10-CM | POA: Diagnosis not present

## 2017-09-15 DIAGNOSIS — D631 Anemia in chronic kidney disease: Secondary | ICD-10-CM | POA: Diagnosis not present

## 2017-09-15 DIAGNOSIS — N2581 Secondary hyperparathyroidism of renal origin: Secondary | ICD-10-CM | POA: Diagnosis not present

## 2017-09-17 DIAGNOSIS — N186 End stage renal disease: Secondary | ICD-10-CM | POA: Diagnosis not present

## 2017-09-17 DIAGNOSIS — N2581 Secondary hyperparathyroidism of renal origin: Secondary | ICD-10-CM | POA: Diagnosis not present

## 2017-09-17 DIAGNOSIS — D631 Anemia in chronic kidney disease: Secondary | ICD-10-CM | POA: Diagnosis not present

## 2017-09-18 DIAGNOSIS — Z992 Dependence on renal dialysis: Secondary | ICD-10-CM | POA: Diagnosis not present

## 2017-09-18 DIAGNOSIS — N186 End stage renal disease: Secondary | ICD-10-CM | POA: Diagnosis not present

## 2017-09-19 DIAGNOSIS — D631 Anemia in chronic kidney disease: Secondary | ICD-10-CM | POA: Diagnosis not present

## 2017-09-19 DIAGNOSIS — N186 End stage renal disease: Secondary | ICD-10-CM | POA: Diagnosis not present

## 2017-09-19 DIAGNOSIS — N2581 Secondary hyperparathyroidism of renal origin: Secondary | ICD-10-CM | POA: Diagnosis not present

## 2017-09-19 DIAGNOSIS — E785 Hyperlipidemia, unspecified: Secondary | ICD-10-CM | POA: Diagnosis not present

## 2017-09-20 DIAGNOSIS — N2581 Secondary hyperparathyroidism of renal origin: Secondary | ICD-10-CM | POA: Diagnosis not present

## 2017-09-20 DIAGNOSIS — E785 Hyperlipidemia, unspecified: Secondary | ICD-10-CM | POA: Diagnosis not present

## 2017-09-20 DIAGNOSIS — D631 Anemia in chronic kidney disease: Secondary | ICD-10-CM | POA: Diagnosis not present

## 2017-09-20 DIAGNOSIS — N186 End stage renal disease: Secondary | ICD-10-CM | POA: Diagnosis not present

## 2017-09-22 DIAGNOSIS — E119 Type 2 diabetes mellitus without complications: Secondary | ICD-10-CM | POA: Diagnosis not present

## 2017-09-22 DIAGNOSIS — D631 Anemia in chronic kidney disease: Secondary | ICD-10-CM | POA: Diagnosis not present

## 2017-09-22 DIAGNOSIS — N186 End stage renal disease: Secondary | ICD-10-CM | POA: Diagnosis not present

## 2017-09-22 DIAGNOSIS — E785 Hyperlipidemia, unspecified: Secondary | ICD-10-CM | POA: Diagnosis not present

## 2017-09-22 DIAGNOSIS — N2581 Secondary hyperparathyroidism of renal origin: Secondary | ICD-10-CM | POA: Diagnosis not present

## 2017-09-23 ENCOUNTER — Telehealth: Payer: Self-pay | Admitting: General Practice

## 2017-09-23 NOTE — Telephone Encounter (Signed)
Copied from [place] [id number]. Topic: Quick Communication - See Telephone Encounter >> Sep 23, 2017 12:30 PM Mylinda Latina, NT wrote: CRM for notification. See Telephone encounter for: 09/23/17. patient called and states he didn't get results back for his lab on 09/12/17. Patient is wanting results. He also states the symptoms has not cleared up. He is still experiencing numbness and can barely bend it , sore and tender. Patient is unsure what to do next. He is unsure if he needs to see a hand specialist. Please call CB# [phone]

## 2017-09-23 NOTE — Telephone Encounter (Signed)
Pt message sent to Philis Fendt.  Labs not released yet,

## 2017-09-24 DIAGNOSIS — E785 Hyperlipidemia, unspecified: Secondary | ICD-10-CM | POA: Diagnosis not present

## 2017-09-24 DIAGNOSIS — D631 Anemia in chronic kidney disease: Secondary | ICD-10-CM | POA: Diagnosis not present

## 2017-09-24 DIAGNOSIS — N186 End stage renal disease: Secondary | ICD-10-CM | POA: Diagnosis not present

## 2017-09-24 DIAGNOSIS — N2581 Secondary hyperparathyroidism of renal origin: Secondary | ICD-10-CM | POA: Diagnosis not present

## 2017-09-24 NOTE — Telephone Encounter (Signed)
LMOVM with Michael's message re: no bacteria, call with worsening symptoms or go to ED.

## 2017-09-24 NOTE — Telephone Encounter (Signed)
Report showing that keflex, which I prescribed last, is effective as he is not growing any bacteria.  If he continues to have symptoms I will need to refer him to hand. Any abrupt worsening then advise ED where stat imaging can be obtained.

## 2017-09-26 ENCOUNTER — Encounter (HOSPITAL_COMMUNITY): Payer: Self-pay | Admitting: Emergency Medicine

## 2017-09-26 ENCOUNTER — Other Ambulatory Visit: Payer: Self-pay

## 2017-09-26 ENCOUNTER — Encounter: Payer: Self-pay | Admitting: Physician Assistant

## 2017-09-26 ENCOUNTER — Ambulatory Visit (INDEPENDENT_AMBULATORY_CARE_PROVIDER_SITE_OTHER): Payer: Medicare Other | Admitting: Physician Assistant

## 2017-09-26 ENCOUNTER — Emergency Department (HOSPITAL_COMMUNITY)
Admission: EM | Admit: 2017-09-26 | Discharge: 2017-09-26 | Disposition: A | Discharge status: Home / Self Care | Payer: Medicare Other | Attending: Emergency Medicine | Admitting: Emergency Medicine

## 2017-09-26 VITALS — BP 76/51 | HR 98 | Temp 98.4°F | Resp 16 | Ht 65.0 in | Wt 241.0 lb

## 2017-09-26 DIAGNOSIS — Z992 Dependence on renal dialysis: Secondary | ICD-10-CM | POA: Insufficient documentation

## 2017-09-26 DIAGNOSIS — L989 Disorder of the skin and subcutaneous tissue, unspecified: Secondary | ICD-10-CM | POA: Diagnosis present

## 2017-09-26 DIAGNOSIS — I132 Hypertensive heart and chronic kidney disease with heart failure and with stage 5 chronic kidney disease, or end stage renal disease: Secondary | ICD-10-CM | POA: Insufficient documentation

## 2017-09-26 DIAGNOSIS — Z794 Long term (current) use of insulin: Secondary | ICD-10-CM | POA: Insufficient documentation

## 2017-09-26 DIAGNOSIS — L988 Other specified disorders of the skin and subcutaneous tissue: Secondary | ICD-10-CM | POA: Diagnosis not present

## 2017-09-26 DIAGNOSIS — N186 End stage renal disease: Secondary | ICD-10-CM | POA: Diagnosis not present

## 2017-09-26 DIAGNOSIS — Z7982 Long term (current) use of aspirin: Secondary | ICD-10-CM | POA: Diagnosis not present

## 2017-09-26 DIAGNOSIS — M79645 Pain in left finger(s): Secondary | ICD-10-CM | POA: Diagnosis not present

## 2017-09-26 DIAGNOSIS — Z79899 Other long term (current) drug therapy: Secondary | ICD-10-CM | POA: Diagnosis not present

## 2017-09-26 DIAGNOSIS — E875 Hyperkalemia: Secondary | ICD-10-CM | POA: Diagnosis not present

## 2017-09-26 DIAGNOSIS — E1122 Type 2 diabetes mellitus with diabetic chronic kidney disease: Secondary | ICD-10-CM | POA: Diagnosis not present

## 2017-09-26 DIAGNOSIS — I96 Gangrene, not elsewhere classified: Secondary | ICD-10-CM | POA: Diagnosis not present

## 2017-09-26 DIAGNOSIS — I5042 Chronic combined systolic (congestive) and diastolic (congestive) heart failure: Secondary | ICD-10-CM | POA: Insufficient documentation

## 2017-09-26 LAB — BASIC METABOLIC PANEL
Anion gap: 16 — ABNORMAL HIGH (ref 5–15)
BUN: 52 mg/dL — ABNORMAL HIGH (ref 6–20)
CO2: 24 mmol/L (ref 22–32)
Calcium: 8.7 mg/dL — ABNORMAL LOW (ref 8.9–10.3)
Chloride: 96 mmol/L — ABNORMAL LOW (ref 98–111)
Creatinine, Ser: 15.14 mg/dL — ABNORMAL HIGH (ref 0.61–1.24)
GFR calc Af Amer: 4 mL/min — ABNORMAL LOW (ref >60)
GFR calc non Af Amer: 3 mL/min — ABNORMAL LOW (ref >60)
Glucose, Bld: 368 mg/dL — ABNORMAL HIGH (ref 70–99)
Potassium: 6.3 mmol/L (ref 3.5–5.1)
Sodium: 136 mmol/L (ref 135–145)

## 2017-09-26 LAB — CBC WITH DIFFERENTIAL/PLATELET
Basophils Absolute: 0 10*3/uL (ref 0.0–0.1)
Basophils Relative: 1 %
Eosinophils Absolute: 0.2 10*3/uL (ref 0.0–0.7)
Eosinophils Relative: 3 %
HCT: 47.1 % (ref 39.0–52.0)
Hemoglobin: 15.4 g/dL (ref 13.0–17.0)
Lymphocytes Relative: 14 %
Lymphs Abs: 1.1 10*3/uL (ref 0.7–4.0)
MCH: 34.7 pg — ABNORMAL HIGH (ref 26.0–34.0)
MCHC: 32.7 g/dL (ref 30.0–36.0)
MCV: 106.1 fL — ABNORMAL HIGH (ref 78.0–100.0)
Monocytes Absolute: 0.6 10*3/uL (ref 0.1–1.0)
Monocytes Relative: 8 %
Neutro Abs: 5.9 10*3/uL (ref 1.7–7.7)
Neutrophils Relative %: 74 %
Platelets: 234 10*3/uL (ref 150–400)
RBC: 4.44 MIL/uL (ref 4.22–5.81)
RDW: 15.9 % — ABNORMAL HIGH (ref 11.5–15.5)
WBC: 7.8 10*3/uL (ref 4.0–10.5)

## 2017-09-26 LAB — C-REACTIVE PROTEIN: CRP: 2.3 mg/dL — ABNORMAL HIGH (ref <1.0)

## 2017-09-26 LAB — I-STAT CG4 LACTIC ACID, ED: Lactic Acid, Venous: 2.15 mmol/L (ref 0.5–1.9)

## 2017-09-26 LAB — SEDIMENTATION RATE: Sed Rate: 23 mm/hr — ABNORMAL HIGH (ref 0–16)

## 2017-09-26 NOTE — Discharge Instructions (Signed)
You have changes in your fingers concerning for lack of blood flow.  Your vascular surgeon will call and set up an appointment for tomorrow for further evaluation.  Your potassium is high today at 6.3, discuss this with your dialysis doctor.  Return if you have any concerns.

## 2017-09-26 NOTE — Addendum Note (Signed)
Addended by: Tereasa Coop on: 09/26/2017 05:35 PM   Modules accepted: Orders

## 2017-09-26 NOTE — Consult Note (Signed)
ORTHOPAEDIC CONSULTATION HISTORY & PHYSICAL REQUESTING PHYSICIAN: Malvin Johns, MD  Chief Complaint: Multiple digit lesions  HPI: [name] [name] is a 52 y.o. male, presently on hemodialysis since 2952 (R radiocephalic AV fistula), who presented to the emergency department today upon referral from [place] [facility], for concerns regarding the left index finger primarily.  It is my understanding that the patient developed a painful area in the pulp of the left index finger.  He was initially placed on Keflex, and developed a dark area at the distal edge of the radial nail fold.  Under digital block, the area was punctured with an 11 blade and cultured, which were negative.  He has since developed small dark spots on both the left long and the right index finger.  Past Medical History:  Diagnosis Date  . Anal infection    posterior anal canal  . Anemia, chronic renal failure   . Atrophic kidney    BILATERAL  . DM type 2 causing ESRD [facility])    Nephrologist-- dr [name] [facility])--  on hemodialysis since June 2012 at  [name]  TTS  . Hemodialysis patient [facility])    at [place] on Tues/ Thur/Sat/schedule  . Hemorrhoids   . Hepatitis B antibody positive   . History of pleural effusion    bilateral  . Hyperparathyroidism, secondary renal ([place])   . Hypertension   . Ischemic cardiomyopathy    per echo 07-01-2014  ef 45%  . LAFB (left anterior fascicular block)   . Peripheral neuropathy   . Systolic and diastolic CHF, chronic ([place])    CARDIOLOGIST-  DR Daneen Schick ([place])  AND DR [name] ([name])   Past Surgical History:  Procedure Laterality Date  . APPENDECTOMY  09-12-2004   laparotomy w/ drainage peritinitis  . AV FISTULA PLACEMENT  02-27-2010   right forearm (RADIOCEPHALIC)  . AV FISTULA REPAIR  10-30-2010  . CARDIOVASCULAR STRESS TEST  10-29-2011   dr Daneen Schick   Low risk scan;  mild perfusion defect seen in  the basal inferoseptal, basal inferior and mid inferior regions consistent with an infarct/scar and/or overlying attenuation/  mild to moderate global LVSF,  ef 40-45%  . DOBUTAMINE STRESS ECHO  07-23-2012   [name]   abnormal ;  at rest estimated lvef 25-30% and global severe LV hypokinesis ;  no cp during stress and achieved 85% maxium predicted heart rate;  negative stress ECG for inducible ischemia;  estimated lvef with stress 35-40%;  augmentation of wall segments consistant with cardiomyopathy and differential fibrosis  . FISTULOTOMY N/A 03/26/2015   Procedure: FISTULOTOMY;  Surgeon: [name], MD;  Location: [facility];  Service: General;  Laterality: N/A;  . INCISION AND DRAINAGE ABSCESS N/A 03/26/2015   Procedure: ANAL INCISION AND DRAINAGE;  Surgeon: [name], MD;  Location: [place];  Service: General;  Laterality: N/A;  . RETINAL DETACHMENT SURGERY Left 2011   incomplete repair/ needs eye drops to keep pressure down  . TRANSTHORACIC ECHOCARDIOGRAM  07-01-2014    done at [facility]   grade 1 diastolic dysfunction,  ef 45%/  trace TR and PR   Social History   Socioeconomic History  . Marital status: Single    Spouse name: Not on file  . Number of children: Not on file  . Years of education: Not on file  . Highest education level: Not on file  Occupational History  . Not on  file  Social Needs  . Financial resource strain: Not on file  . Food insecurity:    Worry: Not on file    Inability: Not on file  . Transportation needs:    Medical: Not on file    Non-medical: Not on file  Tobacco Use  . Smoking status: Never Smoker  . Smokeless tobacco: Never Used  Substance and Sexual Activity  . Alcohol use: No  . Drug use: No  . Sexual activity: Not on file  Lifestyle  . Physical activity:    Days per week: Not on file    Minutes per session: Not on file  . Stress: Not on file  Relationships  . Social connections:     Talks on phone: Not on file    Gets together: Not on file    Attends religious service: Not on file    Active member of club or organization: Not on file    Attends meetings of clubs or organizations: Not on file    Relationship status: Not on file  Other Topics Concern  . Not on file  Social History Narrative  . Not on file   Family History  Family history unknown: Yes   No Known Allergies Prior to Admission medications   Medication Sig Start Date End Date Taking? Authorizing Provider  aspirin 81 MG tablet Take 81 mg by mouth daily.   Yes [provider]  calcium acetate (PHOSLO) 667 MG capsule Take 3 capsules by mouth 3 (three) times daily with meals. 06/02/10  Yes [provider]  cinacalcet (SENSIPAR) 60 MG tablet Take 60 mg by mouth daily.   Yes [provider]  cycloSPORINE (RESTASIS) 0.05 % ophthalmic emulsion Place 1 drop into both eyes 2 (two) times daily. 04/22/14  Yes [provider]  febuxostat (ULORIC) 40 MG tablet Take 40 mg by mouth daily.    Yes [provider]  ferric citrate (AURYXIA) 1 GM 210 MG(Fe) tablet Take 210 mg by mouth 3 (three) times daily. 05/23/14  Yes [provider]  gabapentin (NEURONTIN) 300 MG capsule Take 300 mg by mouth 3 (three) times daily as needed. For pain 05/09/15  Yes [provider]  Insulin Aspart Prot & Aspart (NOVOLOG MIX 70/30 PENFILL [place]) Inject 14 Units into the skin 2 (two) times daily after a meal. Takes 14 units am and evening Adjusts dose if CBG greater than 150   Yes [provider]  LIPITOR 10 MG tablet Take 10 mg by mouth daily. 01/08/16  Yes [provider]  Multiple Vitamins-Minerals (MEGA MULTIVITAMIN FOR MEN PO) Take 1 tablet by mouth daily.    Yes [provider]  sevelamer carbonate (RENVELA) 800 MG tablet Take 800 mg by mouth 3 (three) times daily with meals.   Yes [provider]   No results found.  Positive ROS: All other  systems have been reviewed and were otherwise negative with the exception of those mentioned in the HPI and as above.  Physical Exam: Vitals: Refer to EMR. Constitutional:  WD, WN, NAD HEENT:  NCAT, EOMI Neuro/Psych:  Alert & oriented to person, place, and time; appropriate mood & affect Lymphatic: No generalized extremity edema or lymphadenopathy Extremities / MSK:  The extremities are normal with respect to appearance, ranges of motion, joint stability, muscle strength/tone, sensation, & perfusion except as otherwise noted:  The left index finger has an oval blackened area on the distal radial aspect, just anterior to the edge of the nail.  It is distal to the actual nail fold itself.  More proximally there is an area of relative whiteness of the skin, without any pressure of the pulp.  Proximal to this, the finger appears completely normal without any redness, firmness, swelling, etc.  There is a very small dark spot on the radial distal aspect of the long finger adjacent to the nail, distal to the actual nail fold, and the right index finger pulp is a little purplish.  He indicates that this has been purplish for a long time, to variable degrees, and there is a side where he has his AV fistula.  The radial pulse on the left side is very faint at best, and I cannot detect by palpation an ulnar pulse.  The index finger pulp is tender with firm palpation, and the long finger pulp fills more slowly than the ring or small fingers when squeezed and released.  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  Plan: I discussed this impression with him.  We discussed different ways for proceeding.  He indicated that his relationship with vascular surgery is at [facility], and his primary physician is within the [facility] health system.  He indicated he will be seeing his primary physician tomorrow, together they can decide upon  further alternatives for work-up for these vascular lesions, which would likely benefit from vascular surgery involvement.  I indicated that as a Copy in [place], my role primarily is amputation of ischemic fingertips that have proven to be unsalvageable.  Sometimes medication such as calcium channel blockers taken systemically can be helpful to augment blood flow to digits, and skin Nitropaste applied to the base of the digit, but he already has baseline hypotension which may make such therapies more complicated.  Rayvon Char Grandville Silos, [place], [place]  [address] Office: [phone] Mobile: [phone]  09/26/2017, 7:11 PM

## 2017-09-26 NOTE — ED Notes (Signed)
Pt states he does not produce any urine

## 2017-09-26 NOTE — ED Triage Notes (Signed)
Poor wound healing to left index finger; requesting eval. Denies numbness; ability to move fingers freely.

## 2017-09-26 NOTE — Progress Notes (Addendum)
09/26/2017 1:15 PM   DOB: 14-Jun-1965 / MRN: [id number]  SUBJECTIVE:  [name] [name] is a 52 y.o. male presenting for recheck finger pain. Symptoms present for about 3.5 weeks.  The problem is worsening at this time. He has tried keflex and tramadol, which did help initially. He took a full ten days of keflex.   He has No Known Allergies.   He  has a past medical history of Anal infection, Anemia, chronic renal failure, Atrophic kidney, DM type 2 causing ESRD ([place]), Hemodialysis patient ([place]), Hemorrhoids, Hepatitis B antibody positive, History of pleural effusion, Hyperparathyroidism, secondary renal ([place]), Hypertension, Ischemic cardiomyopathy, LAFB (left anterior fascicular block), Peripheral neuropathy, and Systolic and diastolic CHF, chronic ([place]).    He  reports that he has never smoked. He has never used smokeless tobacco. He reports that he does not drink alcohol or use drugs. He  has no sexual activity history on file. The patient  has a past surgical history that includes Appendectomy (09-12-2004); AV fistula placement (02-27-2010); AV fistula repair (10-30-2010); Cardiovascular stress test (10-29-2011   dr Daneen Schick); Dobutamine stress echo (07-23-2012   [facility]); transthoracic echocardiogram (07-01-2014    done at [facility]); Retinal detachment surgery (Left, 2011); Incision and drainage abscess (N/A, 03/26/2015); and Fistulotomy (N/A, 03/26/2015).  His Family history is unknown by patient.  Review of Systems  Constitutional: Negative for chills and fever.  Respiratory: Negative for shortness of breath.   Cardiovascular: Negative for chest pain.  Gastrointestinal: Negative for nausea.  Musculoskeletal: Negative for myalgias.  Skin: Negative for itching.  Neurological: Negative for dizziness.    The problem list and medications were reviewed and updated by myself where necessary and exist elsewhere in the encounter.   OBJECTIVE:  BP (!) 76/51   Pulse 98   Temp  98.4 F (36.9 C)   Resp 16   Ht 5\' 5"  (1.651 m)   Wt 241 lb (109.3 kg)   SpO2 94%   BMI 40.10 kg/m   Wt Readings from Last 3 Encounters:  09/26/17 241 lb (109.3 kg)  09/12/17 244 lb (110.7 kg)  09/05/17 243 lb (110.2 kg)   Temp Readings from Last 3 Encounters:  09/26/17 98.4 F (36.9 C)  09/12/17 98 F (36.7 C) (Oral)  09/05/17 97.9 F (36.6 C) (Oral)   BP Readings from Last 3 Encounters:  09/26/17 (!) 76/51  09/12/17 (!) 83/32  09/05/17 (!) 94/54   Pulse Readings from Last 3 Encounters:  09/26/17 98  09/12/17 97  09/05/17 94    Physical Exam  Constitutional: He is oriented to person, place, and time. He appears well-developed. He is active.  Non-toxic appearance. He does not appear ill.  Eyes: Pupils are equal, round, and reactive to light. Conjunctivae and EOM are normal.  Cardiovascular: Normal rate.  Pulmonary/Chest: Effort normal. No stridor. No respiratory distress. He has no wheezes. He has no rales.  Abdominal: He exhibits no distension.  Musculoskeletal: Normal range of motion.  Neurological: He is alert and oriented to person, place, and time. He has normal strength and normal reflexes. He is not disoriented. No cranial nerve deficit or sensory deficit. He exhibits normal muscle tone. Coordination and gait normal.  Skin: Skin is warm and dry. He is not diaphoretic. No pallor.  Psychiatric: He has a normal mood and affect. His behavior is normal.  Nursing note and vitals reviewed.        Lab Results  Component Value Date   HGBA1C 10.7 (  H) 03/13/2016    Lab Results  Component Value Date   WBC 7.4 03/14/2016   HGB 16.4 03/14/2016   HCT 50.1 03/14/2016   MCV 110.8 (H) 03/14/2016   PLT 126 (L) 03/14/2016    Lab Results  Component Value Date   CREATININE 8.55 (H) 03/14/2016   CREATININE 8.69 (H) 03/14/2016   BUN 16 03/14/2016   BUN 15 03/14/2016   NA 136 03/14/2016   NA 138 03/14/2016   K 4.2 03/14/2016   K 4.1 03/14/2016   CL 98 (L)  03/14/2016   CL 98 (L) 03/14/2016   CO2 21 (L) 03/14/2016   CO2 24 03/14/2016    Lab Results  Component Value Date   ALT 12 (L) 03/14/2016   AST 14 (L) 03/14/2016   ALKPHOS 132 (H) 03/14/2016   BILITOT 0.8 03/14/2016    Lab Results  Component Value Date   TSH 0.263 (L) 03/13/2016    No results found for: CHOL, HDL, LDLCALC, LDLDIRECT, TRIG, CHOLHDL   ASSESSMENT AND PLAN:  [name] [name] was seen today for hand pain.  Diagnoses and all orders for this visit:  Finger pain, left: There is a new area of paleness about the finger today. His pain and tenderness seem worse than before.  The pain has failed abx outpatient and the finger is certainly no better. Hand consulted and appreciate Dr. Biagio Borg thoughtful recommendations.  Dr. Grandville Silos concerned for pulp infection vs ischemia. He advised the patient to go to the ED for further workup and disposition. Advised patient to go and he is in agreement with the plan.     The patient is advised to call or return to clinic if he does not see an improvement in symptoms, or to seek the care of the closest emergency department if he worsens with the above plan.   Philis Fendt, MHS, PA-C [facility] 09/26/2017 1:15 PM

## 2017-09-26 NOTE — Patient Instructions (Signed)
     IF you received an x-ray today, you will receive an invoice from [facility]. Please contact [facility] at [phone] with questions or concerns regarding your invoice.   IF you received labwork today, you will receive an invoice from [facility]. Please contact [facility] at [phone] with questions or concerns regarding your invoice.   Our billing staff will not be able to assist you with questions regarding bills from these companies.  You will be contacted with the lab results as soon as they are available. The fastest way to get your results is to activate your My Chart account. Instructions are located on the last page of this paperwork. If you have not heard from us regarding the results in 2 weeks, please contact this office.     

## 2017-09-26 NOTE — ED Provider Notes (Signed)
[facility] DEPT Provider Note   CSN: [id number] Arrival date & time: 09/26/17  1320     History   Chief Complaint Chief Complaint  Patient presents with  . Wound Check    HPI [name] [name] is a 52 y.o. male.  The history is provided by the patient. No language interpreter was used.  Wound Check      52 year old male with history of insulin-dependent diabetes, chronic kidney disease currently on hemodialysis, presenting for evaluation of a non healing wound.  Patient report approximately 3 weeks ago he had a painful spot to the pad of his left index finger.  He was seen at urgent care for his condition.  He was prescribed Keflex as treatment.  He ended up taking that medication for 3 weeks as it got better but got worse.  He also develop a black spot to the edge of his nail fold on the same finger a week ago.  Urgent care did a digital block and obtain some culture of the affected area but states that it ended up growing nothing on the wound culture.  He still endorse pain and swelling to the affected site which he rates pain as a aching numbness sensation, 6 out of 10 nonradiating worse with movement.  He now noticed similar spot.  To his left middle finger and right index finger since yesterday.  He reach out to urgent care who recommend patient to come to the ER for further evaluation.  [facility] hand specialist, Dr. Grandville Silos would like patient to have a CT scan of his finger.  Patient currently denies having fever, chills, chest pain, trouble breathing, productive cough.  He denies any specific injury.  Patient denies history of IV drug use.  Past Medical History:  Diagnosis Date  . Anal infection    posterior anal canal  . Anemia, chronic renal failure   . Atrophic kidney    BILATERAL  . DM type 2 causing ESRD [facility])    Nephrologist-- dr [name] [facility]  on hemodialysis since June 2012 at  [name]  TTS  . Hemodialysis patient  [facility])    at [place] on Tues/ Thur/Sat/schedule  . Hemorrhoids   . Hepatitis B antibody positive   . History of pleural effusion    bilateral  . Hyperparathyroidism, secondary renal ([place])   . Hypertension   . Ischemic cardiomyopathy    per echo 07-01-2014  ef 45%  . LAFB (left anterior fascicular block)   . Peripheral neuropathy   . Systolic and diastolic CHF, chronic ([place])    CARDIOLOGIST-  DR Daneen Schick ([place])  AND DR [name] ([name])    Patient Active Problem List   Diagnosis Date Noted  . Acute encephalopathy 03/13/2016  . Acute hyperkalemia 03/13/2016  . Diabetes mellitus type 2, uncontrolled ([place]) 05/03/2014  . Non-ischemic cardiomyopathy ([place]) 05/03/2014  . HYPERCHOLESTEROLEMIA 08/19/2009  . Essential hypertension 08/19/2009  . Chronic systolic heart failure ([place]) 08/19/2009  . CKD (chronic kidney disease) stage V requiring chronic dialysis ([place]) 08/19/2009  . Depression 08/19/2009    Past Surgical History:  Procedure Laterality Date  . APPENDECTOMY  09-12-2004   laparotomy w/ drainage peritinitis  . AV FISTULA PLACEMENT  02-27-2010   right forearm (RADIOCEPHALIC)  . AV FISTULA REPAIR  10-30-2010  . CARDIOVASCULAR STRESS TEST  10-29-2011   dr Daneen Schick   Low risk scan;  mild perfusion defect seen in the basal inferoseptal,  basal inferior and mid inferior regions consistent with an infarct/scar and/or overlying attenuation/  mild to moderate global LVSF,  ef 40-45%  . DOBUTAMINE STRESS ECHO  07-23-2012   [name]   abnormal ;  at rest estimated lvef 25-30% and global severe LV hypokinesis ;  no cp during stress and achieved 85% maxium predicted heart rate;  negative stress ECG for inducible ischemia;  estimated lvef with stress 35-40%;  augmentation of wall segments consistant with cardiomyopathy and differential fibrosis  . FISTULOTOMY N/A 03/26/2015   Procedure: FISTULOTOMY;  Surgeon: [name], MD;  Location:  [facility];  Service: General;  Laterality: N/A;  . INCISION AND DRAINAGE ABSCESS N/A 03/26/2015   Procedure: ANAL INCISION AND DRAINAGE;  Surgeon: [name], MD;  Location: [facility];  Service: General;  Laterality: N/A;  . RETINAL DETACHMENT SURGERY Left 2011   incomplete repair/ needs eye drops to keep pressure down  . TRANSTHORACIC ECHOCARDIOGRAM  07-01-2014    done at [facility]   grade 1 diastolic dysfunction,  ef 45%/  trace TR and PR        Home Medications    Prior to Admission medications   Medication Sig Start Date End Date Taking? Authorizing Provider  acetaminophen (TYLENOL) 500 MG tablet Take 2 tablets (1,000 mg total) by mouth 3 (three) times daily. 07/26/17   Caccavale, Sophia, PA-C  aspirin 81 MG tablet Take 81 mg by mouth daily.    [provider]  calcium acetate (PHOSLO) 667 MG capsule Take 3 capsules by mouth 3 (three) times daily with meals. 06/02/10   [provider]  cephALEXin (KEFLEX) 125 MG/5ML suspension Take 10 mLs (250 mg total) by mouth 2 (two) times daily. 09/12/17   Tereasa Coop, PA-C  cinacalcet (SENSIPAR) 60 MG tablet Take 60 mg by mouth daily.    [provider]  clonazePAM (KLONOPIN) 0.5 MG tablet Take 0.5 mg by mouth at bedtime.    [provider]  cycloSPORINE (RESTASIS) 0.05 % ophthalmic emulsion Place 1 drop into both eyes 2 (two) times daily. 04/22/14   [provider]  febuxostat (ULORIC) 40 MG tablet Take 40 mg by mouth daily.     [provider]  ferric citrate (AURYXIA) 1 GM 210 MG(Fe) tablet Take 210 mg by mouth 3 (three) times daily. 05/23/14   [provider]  gabapentin (NEURONTIN) 300 MG capsule Take 300 mg by mouth 3 (three) times daily as needed. For pain 05/09/15   [provider]  Insulin Aspart Prot & Aspart (NOVOLOG MIX 70/30 PENFILL [place]) Inject 14 Units into the skin 2 (two) times daily after a meal. Takes 14 units am and  evening Adjusts dose if CBG greater than 150    [provider]  LIPITOR 10 MG tablet Take 10 mg by mouth daily. 01/08/16   [provider]  Multiple Vitamins-Minerals (MEGA MULTIVITAMIN FOR MEN PO) Take by mouth.    [provider]  sevelamer carbonate (RENVELA) 800 MG tablet Take 800 mg by mouth 3 (three) times daily with meals.    [provider]  traMADol (ULTRAM) 50 MG tablet Take 0.5-1 tablets (25-50 mg total) by mouth 3 (three) times daily. 09/12/17   Tereasa Coop, PA-C    Family History Family History  Family history unknown: Yes    Social History Social History   Tobacco Use  . Smoking status: Never Smoker  . Smokeless tobacco: Never Used  Substance Use Topics  .  Alcohol use: No  . Drug use: No     Allergies   Patient has no known allergies.   Review of Systems Review of Systems  All other systems reviewed and are negative.    Physical Exam Updated Vital Signs BP (!) 107/47 (BP Location: Left Arm)   Pulse (!) 110   Temp 98.3 F (36.8 C) (Oral)   Resp 16   SpO2 95%   Physical Exam  Constitutional: He appears well-developed and well-nourished. No distress.  HENT:  Head: Normocephalic and atraumatic.  Eyes: Conjunctivae are normal.  Neck: Normal range of motion. Neck supple. No JVD present.  Cardiovascular:  Mild tachycardia without murmur rubs or gallop  Pulmonary/Chest: Effort normal and breath sounds normal. He has no rales.  Abdominal: Soft. He exhibits no distension. There is no tenderness.  Musculoskeletal: He exhibits tenderness (Left hand: A 4 mm ovoid eschar skin changes noted to the medial nail fold.  Finger is edematous and tender to palpation with normal flexion extension throughout all joints and intact cap refill.).  L radial pulse palpable, not bounding.    Neurological: He is alert.  Skin: No rash noted.  Left hand: Aside from the eschar tissue changes noted to left index finger at the medial nail  fold, a similar dark spot noted to the left middle finger at the medial nail fold as well has one additional pinpoint spot noted to the right hand, index finger at the medial nail fold.  Left foot: Small 3 mm ovoid dark spot noted to the proximal nail fold of the left great toe nontender to palpation.  Psychiatric: He has a normal mood and affect.  Nursing note and vitals reviewed.               ED Treatments / Results  Labs (all labs ordered are listed, but only abnormal results are displayed) Labs Reviewed  CBC WITH DIFFERENTIAL/PLATELET - Abnormal; Notable for the following components:      Result Value   MCV 106.1 (*)    MCH 34.7 (*)    RDW 15.9 (*)    All other components within normal limits  SEDIMENTATION RATE - Abnormal; Notable for the following components:   Sed Rate 23 (*)    All other components within normal limits  BASIC METABOLIC PANEL - Abnormal; Notable for the following components:   Potassium 6.3 (*)    Chloride 96 (*)    Glucose, Bld 368 (*)    BUN 52 (*)    Creatinine, Ser 15.14 (*)    Calcium 8.7 (*)    GFR calc non Af Amer 3 (*)    GFR calc Af Amer 4 (*)    Anion gap 16 (*)    All other components within normal limits  I-STAT CG4 LACTIC ACID, ED - Abnormal; Notable for the following components:   Lactic Acid, Venous 2.15 (*)    All other components within normal limits  CULTURE, BLOOD (ROUTINE X 2)  CULTURE, BLOOD (ROUTINE X 2)  C-REACTIVE PROTEIN  I-STAT CHEM 8, ED    EKG None  Radiology No results found.  Procedures .Critical Care Performed by: Domenic Moras, PA-C Authorized by: Domenic Moras, PA-C   Critical care provider statement:    Critical care time (minutes):  35   Critical care start time:  09/26/2017 3:58 PM   Critical care end time:  09/26/2017 8:36 PM   Critical care time was exclusive of:  Separately billable procedures and treating other patients  Critical care was necessary to treat or prevent imminent or  life-threatening deterioration of the following conditions:  Circulatory failure   Critical care was time spent personally by me on the following activities:  Blood draw for specimens, development of treatment plan with patient or surrogate, discussions with consultants, discussions with primary provider, examination of patient, obtaining history from patient or surrogate, ordering and review of laboratory studies, ordering and review of radiographic studies, review of old charts and re-evaluation of patient's condition   I assumed direction of critical care for this patient from another provider in my specialty: no     (including critical care time)  Medications Ordered in ED Medications - No data to display   Initial Impression / Assessment and Plan / ED Course  I have reviewed the triage vital signs and the nursing notes.  Pertinent labs & imaging results that were available during my care of the patient were reviewed by me and considered in my medical decision making (see chart for details).     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  No hx of IVDU but is a dialysis pt (T-Th-Sat), last dialysis was 2 days ago.  He is here requesting for antibiotic treatment for his finger.  He does not want to be admitted. Care discussed with Dr. Tamera Punt.    6:53 PM Elevated potassium of 6.3 without visible hemolysis.  Elevated CBG of 368 as well as worsening renal function with BUN 52, creatinine 15.4.  Patient has an anion gap of 16.  Elevated lactic acid of 2.15.  Normal WBC at 7.8.  Mildly elevated sed rate of 23.  7:17 PM Appreciate consultation from Hand specialist Dr. Grandville Silos who agrees to see pt in  the ER and help with disposition.    8:15 PM Hand specialist Dr. Grandville Silos has seen evaluate patient.  He felt that this is likely to be a vascular pathology and unlikely to be a pulp infection.  He does not think anabiotic will be beneficial.  He recommend consulting with vascular surgeon for further care.  Patient report his vascular surgeon is at [facility].  Patient also voiced that he does not want to be admitted to the hospital and prefers to be evaluated outpatient.  He was made aware that his potassium level was elevated today at 6.3 but states he normally receives his treatment through his doctor and does not want any specific treatment at this time.  Appreciate consultation from vascular surgeon, Dr. Magdalene Molly from [facility] who acknowledge patient's presenting symptoms.  He will have the office call patient tomorrow for an appointment tomorrow.   Domenic Moras, PA-C 09/26/17 2036    Malvin Johns, MD 09/28/17 1151

## 2017-09-27 DIAGNOSIS — I739 Peripheral vascular disease, unspecified: Secondary | ICD-10-CM | POA: Diagnosis not present

## 2017-09-27 DIAGNOSIS — M159 Polyosteoarthritis, unspecified: Secondary | ICD-10-CM | POA: Diagnosis not present

## 2017-09-27 DIAGNOSIS — I5032 Chronic diastolic (congestive) heart failure: Secondary | ICD-10-CM | POA: Diagnosis not present

## 2017-09-27 DIAGNOSIS — N2581 Secondary hyperparathyroidism of renal origin: Secondary | ICD-10-CM | POA: Diagnosis not present

## 2017-09-27 DIAGNOSIS — E1140 Type 2 diabetes mellitus with diabetic neuropathy, unspecified: Secondary | ICD-10-CM | POA: Diagnosis not present

## 2017-09-27 DIAGNOSIS — D631 Anemia in chronic kidney disease: Secondary | ICD-10-CM | POA: Diagnosis not present

## 2017-09-27 DIAGNOSIS — Z79891 Long term (current) use of opiate analgesic: Secondary | ICD-10-CM | POA: Diagnosis not present

## 2017-09-27 DIAGNOSIS — R55 Syncope and collapse: Secondary | ICD-10-CM | POA: Diagnosis not present

## 2017-09-27 DIAGNOSIS — I998 Other disorder of circulatory system: Secondary | ICD-10-CM | POA: Diagnosis not present

## 2017-09-27 DIAGNOSIS — E785 Hyperlipidemia, unspecified: Secondary | ICD-10-CM | POA: Diagnosis not present

## 2017-09-27 DIAGNOSIS — L905 Scar conditions and fibrosis of skin: Secondary | ICD-10-CM | POA: Diagnosis not present

## 2017-09-27 DIAGNOSIS — Z79899 Other long term (current) drug therapy: Secondary | ICD-10-CM | POA: Diagnosis not present

## 2017-09-27 DIAGNOSIS — E875 Hyperkalemia: Secondary | ICD-10-CM | POA: Diagnosis not present

## 2017-09-27 DIAGNOSIS — E7800 Pure hypercholesterolemia, unspecified: Secondary | ICD-10-CM | POA: Diagnosis not present

## 2017-09-27 DIAGNOSIS — N186 End stage renal disease: Secondary | ICD-10-CM | POA: Diagnosis not present

## 2017-09-29 DIAGNOSIS — N186 End stage renal disease: Secondary | ICD-10-CM | POA: Diagnosis not present

## 2017-09-29 DIAGNOSIS — N2581 Secondary hyperparathyroidism of renal origin: Secondary | ICD-10-CM | POA: Diagnosis not present

## 2017-09-29 DIAGNOSIS — E785 Hyperlipidemia, unspecified: Secondary | ICD-10-CM | POA: Diagnosis not present

## 2017-09-29 DIAGNOSIS — D631 Anemia in chronic kidney disease: Secondary | ICD-10-CM | POA: Diagnosis not present

## 2017-09-30 DIAGNOSIS — I998 Other disorder of circulatory system: Secondary | ICD-10-CM | POA: Diagnosis not present

## 2017-09-30 DIAGNOSIS — R209 Unspecified disturbances of skin sensation: Secondary | ICD-10-CM | POA: Diagnosis not present

## 2017-09-30 DIAGNOSIS — M79641 Pain in right hand: Secondary | ICD-10-CM | POA: Diagnosis not present

## 2017-09-30 DIAGNOSIS — N186 End stage renal disease: Secondary | ICD-10-CM | POA: Diagnosis not present

## 2017-09-30 DIAGNOSIS — M79642 Pain in left hand: Secondary | ICD-10-CM | POA: Diagnosis not present

## 2017-10-01 LAB — CULTURE, BLOOD (ROUTINE X 2)
Culture: NO GROWTH
Culture: NO GROWTH
Special Requests: ADEQUATE
Special Requests: ADEQUATE

## 2017-10-03 DIAGNOSIS — I10 Essential (primary) hypertension: Secondary | ICD-10-CM | POA: Diagnosis not present

## 2017-10-03 DIAGNOSIS — G609 Hereditary and idiopathic neuropathy, unspecified: Secondary | ICD-10-CM | POA: Diagnosis not present

## 2017-10-03 DIAGNOSIS — I509 Heart failure, unspecified: Secondary | ICD-10-CM | POA: Diagnosis not present

## 2017-10-03 DIAGNOSIS — E11319 Type 2 diabetes mellitus with unspecified diabetic retinopathy without macular edema: Secondary | ICD-10-CM | POA: Diagnosis not present

## 2017-10-03 DIAGNOSIS — N186 End stage renal disease: Secondary | ICD-10-CM | POA: Diagnosis not present

## 2017-10-03 DIAGNOSIS — E1165 Type 2 diabetes mellitus with hyperglycemia: Secondary | ICD-10-CM | POA: Diagnosis not present

## 2017-10-04 DIAGNOSIS — N2581 Secondary hyperparathyroidism of renal origin: Secondary | ICD-10-CM | POA: Diagnosis not present

## 2017-10-04 DIAGNOSIS — D631 Anemia in chronic kidney disease: Secondary | ICD-10-CM | POA: Diagnosis not present

## 2017-10-04 DIAGNOSIS — N186 End stage renal disease: Secondary | ICD-10-CM | POA: Diagnosis not present

## 2017-10-04 DIAGNOSIS — E785 Hyperlipidemia, unspecified: Secondary | ICD-10-CM | POA: Diagnosis not present

## 2017-10-06 DIAGNOSIS — E785 Hyperlipidemia, unspecified: Secondary | ICD-10-CM | POA: Diagnosis not present

## 2017-10-06 DIAGNOSIS — N186 End stage renal disease: Secondary | ICD-10-CM | POA: Diagnosis not present

## 2017-10-06 DIAGNOSIS — N2581 Secondary hyperparathyroidism of renal origin: Secondary | ICD-10-CM | POA: Diagnosis not present

## 2017-10-06 DIAGNOSIS — D631 Anemia in chronic kidney disease: Secondary | ICD-10-CM | POA: Diagnosis not present

## 2017-10-08 DIAGNOSIS — D631 Anemia in chronic kidney disease: Secondary | ICD-10-CM | POA: Diagnosis not present

## 2017-10-08 DIAGNOSIS — E785 Hyperlipidemia, unspecified: Secondary | ICD-10-CM | POA: Diagnosis not present

## 2017-10-08 DIAGNOSIS — N2581 Secondary hyperparathyroidism of renal origin: Secondary | ICD-10-CM | POA: Diagnosis not present

## 2017-10-08 DIAGNOSIS — N186 End stage renal disease: Secondary | ICD-10-CM | POA: Diagnosis not present

## 2017-10-09 NOTE — H&P (View-Only) (Signed)
Cardiology Office Note:    Date:  10/12/2017   ID:  [name] [name], DOB 1966/02/02, MRN [id number]  PCP:  [name] Liberty, MD  Cardiologist:  No primary care provider on file.   Referring MD: [name] Liberty, MD   Chief Complaint  Patient presents with  . Coronary Artery Disease  . Congestive Heart Failure  . Advice Only    History of Present Illness:    [name] [name] is a 52 y.o. male with a hx of non-ischemic cardiomyopathy, ESRD, DM II, and obesity. He has 2 sets of specialist. He has a nephrologist, Dr. Brayton [name] in [facility]. He is also is being seen by nephrology at [facility] and is on the list for transplantation.[name] [name] He also sees a cardiologist at [facility], Dr. [name].  Over the past 4 to 6 weeks he has had hemic events with focal necrosis on the tips of both index fingers.  He denies neurological complaints.  No chest pain.  He denies chills, fever, and anorexia.  He has seen his primary physician, Dr. Katherine [name].  He was told that the abnormalities on his digits were related to blood clots.  His fingers feel somewhat numb.  He has no numbness in his feet.  He denies orthopnea and PND.  He has no skin lesions elsewhere.  He denies similar problems in his feet.   Past Medical History:  Diagnosis Date  . Anal infection    posterior anal canal  . Anemia, chronic renal failure   . Atrophic kidney    BILATERAL  . DM type 2 causing ESRD [facility])    Nephrologist-- dr [name] [name] [facility])--  on hemodialysis since June 2012 at  [name]  TTS  . Hemodialysis patient [facility])    at [place] on Tues/ Thur/Sat/schedule  . Hemorrhoids   . Hepatitis B antibody positive   . History of pleural effusion    bilateral  . Hyperparathyroidism, secondary renal ([place])   . Hypertension   . Ischemic cardiomyopathy    per echo 07-01-2014  ef 45%  . LAFB (left anterior fascicular block)   . Peripheral neuropathy   . Systolic and  diastolic CHF, chronic ([place])    CARDIOLOGIST-  DR [name] [name] ([place])  AND DR [name] ([name])    Past Surgical History:  Procedure Laterality Date  . APPENDECTOMY  09-12-2004   laparotomy w/ drainage peritinitis  . AV FISTULA PLACEMENT  02-27-2010   right forearm (RADIOCEPHALIC)  . AV FISTULA REPAIR  10-30-2010  . CARDIOVASCULAR STRESS TEST  10-29-2011   dr [name] [name]   Low risk scan;  mild perfusion defect seen in the basal inferoseptal, basal inferior and mid inferior regions consistent with an infarct/scar and/or overlying attenuation/  mild to moderate global LVSF,  ef 40-45%  . DOBUTAMINE STRESS ECHO  07-23-2012   [name]   abnormal ;  at rest estimated lvef 25-30% and global severe LV hypokinesis ;  no cp during stress and achieved 85% maxium predicted heart rate;  negative stress ECG for inducible ischemia;  estimated lvef with stress 35-40%;  augmentation of wall segments consistant with cardiomyopathy and differential fibrosis  . FISTULOTOMY N/A 03/26/2015   Procedure: FISTULOTOMY;  Surgeon: [name] Ruff, MD;  Location: [facility];  Service: General;  Laterality: N/A;  . INCISION AND DRAINAGE ABSCESS N/A 03/26/2015   Procedure: ANAL INCISION AND DRAINAGE;  Surgeon: [name] Ruff, MD;  Location: [place];  Service: General;  Laterality: N/A;  . RETINAL DETACHMENT SURGERY Left 2011   incomplete repair/ needs eye drops to keep pressure down  . TRANSTHORACIC ECHOCARDIOGRAM  07-01-2014    done at [facility]   grade 1 diastolic dysfunction,  ef 45%/  trace TR and PR    Current Medications: Current Meds  Medication Sig  . aspirin 325 MG tablet Take 325 mg by mouth daily.  . calcium acetate (PHOSLO) 667 MG capsule Take 3 capsules by mouth 3 (three) times daily with meals.  . cinacalcet (SENSIPAR) 60 MG tablet Take 60 mg by mouth daily.  . cycloSPORINE (RESTASIS) 0.05 % ophthalmic emulsion Place 1 drop  into both eyes 2 (two) times daily.  . febuxostat (ULORIC) 40 MG tablet Take 40 mg by mouth daily.   . ferric citrate (AURYXIA) 1 GM 210 MG(Fe) tablet Take 210 mg by mouth 3 (three) times daily.  [name] [name] gabapentin (NEURONTIN) 300 MG capsule Take 300 mg by mouth 3 (three) times daily as needed. For pain  . Insulin Aspart Prot & Aspart (NOVOLOG MIX 70/30 PENFILL ) Inject 16 units into the skin each morning and 14 units into the skin each evening before meals. Adjusts dose if CBG greater than 150  . lidocaine (XYLOCAINE) 5 % ointment Apply 1 application topically 3 (three) times daily as needed. (Finger Pain)  . LIPITOR 10 MG tablet Take 10 mg by mouth daily.  . midodrine (PROAMATINE) 5 MG tablet Take 5 mg by mouth daily three (3) times per week before dialysis. (Tuesdays, Thursdays, and Saturdays)  . Multiple Vitamins-Minerals (MEGA MULTIVITAMIN FOR MEN PO) Take 1 tablet by mouth daily.   . sevelamer carbonate (RENVELA) 800 MG tablet Take 800 mg by mouth 3 (three) times daily with meals.     Allergies:   Patient has no known allergies.   Social History   Socioeconomic History  . Marital status: Single    Spouse name: Not on file  . Number of children: Not on file  . Years of education: Not on file  . Highest education level: Not on file  Occupational History  . Not on file  Social Needs  . Financial resource strain: Not on file  . Food insecurity:    Worry: Not on file    Inability: Not on file  . Transportation needs:    Medical: Not on file    Non-medical: Not on file  Tobacco Use  . Smoking status: Never Smoker  . Smokeless tobacco: Never Used  Substance and Sexual Activity  . Alcohol use: No  . Drug use: No  . Sexual activity: Not on file  Lifestyle  . Physical activity:    Days per week: Not on file    Minutes per session: Not on file  . Stress: Not on file  Relationships  . Social connections:    Talks on phone: Not on file    Gets together: Not on file    Attends  religious service: Not on file    Active member of club or organization: Not on file    Attends meetings of clubs or organizations: Not on file    Relationship status: Not on file  Other Topics Concern  . Not on file  Social History Narrative  . Not on file     Family History: The patient's Family history is unknown by patient.  ROS:   Please see the history of present illness.    Recent chills,  leg pain, joint swelling, easy bruising.  All other systems reviewed and are negative.  EKGs/Labs/Other Studies Reviewed:    The following studies were reviewed today: No recent or new cardiac studies.  2D Doppler echocardiogram performed 01/26/2017:  Study Conclusions  - Left ventricle: Poor image quality even with definity ? diffuse   hypokinesis worse in septum and apex. Systolic function was   mildly to moderately reduced. The estimated ejection fraction was   in the range of 40% to 45%. Doppler parameters are consistent   with abnormal left ventricular relaxation (grade 1 diastolic   dysfunction). - Mitral valve: Calcified annulus. Mildly thickened leaflets . - Right ventricle: The cavity size was mildly dilated.   EKG:  EKG is is ordered today.  The ekg ordered today demonstrates sinus tachycardia poor R wave progression V1 through V4.  Compared to the prior tracing from December 2017, no significant change is noted.  Recent Labs: 09/26/2017: BUN 52; Creatinine, Ser 15.14; Hemoglobin 15.4; Platelets 234; Potassium 6.3; Sodium 136  Recent Lipid Panel No results found for: CHOL, TRIG, HDL, CHOLHDL, VLDL, LDLCALC, LDLDIRECT  Physical Exam:    VS:  BP 94/60   Pulse (!) 101   Ht 5\' 5"  (1.651 m)   Wt 243 lb (110.2 kg)   BMI 40.44 kg/m     Wt Readings from Last 3 Encounters:  10/12/17 243 lb (110.2 kg)  09/26/17 241 lb (109.3 kg)  09/12/17 244 lb (110.7 kg)     GEN: Morbidly obese  in no acute distress HEENT: Normal NECK: No JVD. LYMPHATICS: No  lymphadenopathy CARDIAC: RRR, 1/6 systolicmurmur, soft S3 gallop, trace ankle edema. VASCULAR: Absent left and right radial pulses.  Right upper arm continuous bruit from AV fistula RESPIRATORY:  Clear to auscultation without rales, wheezing or rhonchi  ABDOMEN: Soft, non-tender, non-distended, No pulsatile mass, MUSCULOSKELETAL: No deformity  SKIN: Warm and dry NEUROLOGIC:  Alert and oriented x 3 PSYCHIATRIC:  Normal affect   ASSESSMENT:    1. Embolism, arterial ([name])   2. Non-ischemic cardiomyopathy ([place])   3. HYPERCHOLESTEROLEMIA   4. Essential hypertension   5. CKD (chronic kidney disease) stage V requiring chronic dialysis ([place])   6. Chronic systolic heart failure ([name])    PLAN:    In order of problems listed above:  1. Systemic emboli Vs vasculitis: Transesophageal echocardiography will be done to rule out marantic endocarditis versus bacterial endocarditis or intra-cardiac thrombi.  He will also have bilateral carotid and subclavian Doppler/duplex studies to rule out local obstructive disease.  May need to have aortic CT scan as well.  We will potentially need serologic evaluation and consideration of systemic vasculitis.  Should have done a sed rate today but we did not get it ordered. 2. No clinical evidence of volume overload. 3. LDL target less than 70 4. Target is less than 130/80 mmHg 5. On chronic dialysis therapy.  Patient has infarcts involving the index finger bilaterally.  Etiology is uncertain.  He does not smoke.  Systemic embolization [place] atherosclerotic or septic should be considered.  Need to exclude the possibility of vasculitis.  We will plan a transesophageal echo to exclude endocarditis.  Bilateral carotid Doppler and subclavian investigation with Doppler.  We need to have a sed rate and serologic evaluation for vasculitis.  Concerning prognosis.   Medication Adjustments/Labs and Tests Ordered: Current medicines are reviewed at length with the patient  today.  Concerns regarding medicines are outlined above.  Orders Placed This Encounter  Procedures  .  EKG 12-Lead   No orders of the defined types were placed in this encounter.   Patient Instructions  Medication Instructions:  Your physician recommends that you continue on your current medications as directed. Please refer to the Current Medication list given to you today.  Labwork: None  Testing/Procedures: Your physician has requested that you have a carotid duplex. This test is an ultrasound of the carotid arteries in your neck. It looks at blood flow through these arteries that supply the brain with blood. Allow one hour for this exam. There are no restrictions or special instructions.   Your physician has requested that you have a TEE. During a TEE, sound waves are used to create images of your heart. It provides your doctor with information about the size and shape of your heart and how well your heart's chambers and valves are working. In this test, a transducer is attached to the end of a flexible tube that's guided down your throat and into your esophagus (the tube leading from you mouth to your stomach) to get a more detailed image of your heart. You are not awake for the procedure. Please see the instruction sheet given to you today. For further information please visit [email or web address].    Follow-Up: Your physician wants you to follow-up in: 6 months with Dr. Tamala Julian.  You will receive a reminder letter in the mail two months in advance. If you don't receive a letter, please call our office to schedule the follow-up appointment.   Any Other Special Instructions Will Be Listed Below (If Applicable).     If you need a refill on your cardiac medications before your next appointment, please call your pharmacy.      Signed, Sinclair Grooms, MD  10/12/2017 1:07 PM    [place]

## 2017-10-09 NOTE — Progress Notes (Signed)
Cardiology Office Note:    Date:  10/12/2017   ID:  [name] [name], DOB October 30, 1965, MRN [id number]  PCP:  [name], MD  Cardiologist:  No primary care provider on file.   Referring MD: [name], MD   Chief Complaint  Patient presents with  . Coronary Artery Disease  . Congestive Heart Failure  . Advice Only    History of Present Illness:    [name] [name] is a 52 y.o. male with a hx of non-ischemic cardiomyopathy, ESRD, DM II, and obesity. He has 2 sets of specialist. He has a nephrologist, Dr. Brayton El in [facility]. He is also is being seen by nephrology at [facility] and is on the list for transplantation.[name] He also sees a cardiologist at [facility], Dr. [name].  Over the past 4 to 6 weeks he has had hemic events with focal necrosis on the tips of both index fingers.  He denies neurological complaints.  No chest pain.  He denies chills, fever, and anorexia.  He has seen his primary physician, Dr. Katherine Roan.  He was told that the abnormalities on his digits were related to blood clots.  His fingers feel somewhat numb.  He has no numbness in his feet.  He denies orthopnea and PND.  He has no skin lesions elsewhere.  He denies similar problems in his feet.   Past Medical History:  Diagnosis Date  . Anal infection    posterior anal canal  . Anemia, chronic renal failure   . Atrophic kidney    BILATERAL  . DM type 2 causing ESRD [facility])    Nephrologist-- dr [name] [facility])--  on hemodialysis since June 2012 at  [name]  TTS  . Hemodialysis patient [facility])    at [place] on Tues/ Thur/Sat/schedule  . Hemorrhoids   . Hepatitis B antibody positive   . History of pleural effusion    bilateral  . Hyperparathyroidism, secondary renal ([place])   . Hypertension   . Ischemic cardiomyopathy    per echo 07-01-2014  ef 45%  . LAFB (left anterior fascicular block)   . Peripheral neuropathy   . Systolic and  diastolic CHF, chronic ([place])    CARDIOLOGIST-  DR Daneen Schick ([place])  AND DR [name] ([name])    Past Surgical History:  Procedure Laterality Date  . APPENDECTOMY  09-12-2004   laparotomy w/ drainage peritinitis  . AV FISTULA PLACEMENT  02-27-2010   right forearm (RADIOCEPHALIC)  . AV FISTULA REPAIR  10-30-2010  . CARDIOVASCULAR STRESS TEST  10-29-2011   dr Daneen Schick   Low risk scan;  mild perfusion defect seen in the basal inferoseptal, basal inferior and mid inferior regions consistent with an infarct/scar and/or overlying attenuation/  mild to moderate global LVSF,  ef 40-45%  . DOBUTAMINE STRESS ECHO  07-23-2012   [name]   abnormal ;  at rest estimated lvef 25-30% and global severe LV hypokinesis ;  no cp during stress and achieved 85% maxium predicted heart rate;  negative stress ECG for inducible ischemia;  estimated lvef with stress 35-40%;  augmentation of wall segments consistant with cardiomyopathy and differential fibrosis  . FISTULOTOMY N/A 03/26/2015   Procedure: FISTULOTOMY;  Surgeon: [name], MD;  Location: [facility];  Service: General;  Laterality: N/A;  . INCISION AND DRAINAGE ABSCESS N/A 03/26/2015   Procedure: ANAL INCISION AND DRAINAGE;  Surgeon: [name], MD;  Location[place];  Service: General;  Laterality: N/A;  . RETINAL DETACHMENT SURGERY Left 2011   incomplete repair/ needs eye drops to keep pressure down  . TRANSTHORACIC ECHOCARDIOGRAM  07-01-2014    done at [facility]   grade 1 diastolic dysfunction,  ef 45%/  trace TR and PR    Current Medications: Current Meds  Medication Sig  . aspirin 325 MG tablet Take 325 mg by mouth daily.  . calcium acetate (PHOSLO) 667 MG capsule Take 3 capsules by mouth 3 (three) times daily with meals.  . cinacalcet (SENSIPAR) 60 MG tablet Take 60 mg by mouth daily.  . cycloSPORINE (RESTASIS) 0.05 % ophthalmic emulsion Place 1 drop  into both eyes 2 (two) times daily.  . febuxostat (ULORIC) 40 MG tablet Take 40 mg by mouth daily.   . ferric citrate (AURYXIA) 1 GM 210 MG(Fe) tablet Take 210 mg by mouth 3 (three) times daily.  [name] gabapentin (NEURONTIN) 300 MG capsule Take 300 mg by mouth 3 (three) times daily as needed. For pain  . Insulin Aspart Prot & Aspart (NOVOLOG MIX 70/30 PENFILL [place]) Inject 16 units into the skin each morning and 14 units into the skin each evening before meals. Adjusts dose if CBG greater than 150  . lidocaine (XYLOCAINE) 5 % ointment Apply 1 application topically 3 (three) times daily as needed. (Finger Pain)  . LIPITOR 10 MG tablet Take 10 mg by mouth daily.  . midodrine (PROAMATINE) 5 MG tablet Take 5 mg by mouth daily three (3) times per week before dialysis. (Tuesdays, Thursdays, and Saturdays)  . Multiple Vitamins-Minerals (MEGA MULTIVITAMIN FOR MEN PO) Take 1 tablet by mouth daily.   . sevelamer carbonate (RENVELA) 800 MG tablet Take 800 mg by mouth 3 (three) times daily with meals.     Allergies:   Patient has no known allergies.   Social History   Socioeconomic History  . Marital status: Single    Spouse name: Not on file  . Number of children: Not on file  . Years of education: Not on file  . Highest education level: Not on file  Occupational History  . Not on file  Social Needs  . Financial resource strain: Not on file  . Food insecurity:    Worry: Not on file    Inability: Not on file  . Transportation needs:    Medical: Not on file    Non-medical: Not on file  Tobacco Use  . Smoking status: Never Smoker  . Smokeless tobacco: Never Used  Substance and Sexual Activity  . Alcohol use: No  . Drug use: No  . Sexual activity: Not on file  Lifestyle  . Physical activity:    Days per week: Not on file    Minutes per session: Not on file  . Stress: Not on file  Relationships  . Social connections:    Talks on phone: Not on file    Gets together: Not on file    Attends  religious service: Not on file    Active member of club or organization: Not on file    Attends meetings of clubs or organizations: Not on file    Relationship status: Not on file  Other Topics Concern  . Not on file  Social History Narrative  . Not on file     Family History: The patient's Family history is unknown by patient.  ROS:   Please see the history of present illness.    Recent chills,  leg pain, joint swelling, easy bruising.  All other systems reviewed and are negative.  EKGs/Labs/Other Studies Reviewed:    The following studies were reviewed today: No recent or new cardiac studies.  2D Doppler echocardiogram performed 01/26/2017:  Study Conclusions  - Left ventricle: Poor image quality even with definity ? diffuse   hypokinesis worse in septum and apex. Systolic function was   mildly to moderately reduced. The estimated ejection fraction was   in the range of 40% to 45%. Doppler parameters are consistent   with abnormal left ventricular relaxation (grade 1 diastolic   dysfunction). - Mitral valve: Calcified annulus. Mildly thickened leaflets . - Right ventricle: The cavity size was mildly dilated.   EKG:  EKG is is ordered today.  The ekg ordered today demonstrates sinus tachycardia poor R wave progression V1 through V4.  Compared to the prior tracing from December 2017, no significant change is noted.  Recent Labs: 09/26/2017: BUN 52; Creatinine, Ser 15.14; Hemoglobin 15.4; Platelets 234; Potassium 6.3; Sodium 136  Recent Lipid Panel No results found for: CHOL, TRIG, HDL, CHOLHDL, VLDL, LDLCALC, LDLDIRECT  Physical Exam:    VS:  BP 94/60   Pulse (!) 101   Ht 5\' 5"  (1.651 m)   Wt 243 lb (110.2 kg)   BMI 40.44 kg/m     Wt Readings from Last 3 Encounters:  10/12/17 243 lb (110.2 kg)  09/26/17 241 lb (109.3 kg)  09/12/17 244 lb (110.7 kg)     GEN: Morbidly obese  in no acute distress HEENT: Normal NECK: No JVD. LYMPHATICS: No  lymphadenopathy CARDIAC: RRR, 1/6 systolicmurmur, soft S3 gallop, trace ankle edema. VASCULAR: Absent left and right radial pulses.  Right upper arm continuous bruit from AV fistula RESPIRATORY:  Clear to auscultation without rales, wheezing or rhonchi  ABDOMEN: Soft, non-tender, non-distended, No pulsatile mass, MUSCULOSKELETAL: No deformity  SKIN: Warm and dry NEUROLOGIC:  Alert and oriented x 3 PSYCHIATRIC:  Normal affect   ASSESSMENT:    1. Embolism, arterial ([name])   2. Non-ischemic cardiomyopathy ([place])   3. HYPERCHOLESTEROLEMIA   4. Essential hypertension   5. CKD (chronic kidney disease) stage V requiring chronic dialysis ([place])   6. Chronic systolic heart failure ([name])    PLAN:    In order of problems listed above:  1. Systemic emboli Vs vasculitis: Transesophageal echocardiography will be done to rule out marantic endocarditis versus bacterial endocarditis or intra-cardiac thrombi.  He will also have bilateral carotid and subclavian Doppler/duplex studies to rule out local obstructive disease.  May need to have aortic CT scan as well.  We will potentially need serologic evaluation and consideration of systemic vasculitis.  Should have done a sed rate today but we did not get it ordered. 2. No clinical evidence of volume overload. 3. LDL target less than 70 4. Target is less than 130/80 mmHg 5. On chronic dialysis therapy.  Patient has infarcts involving the index finger bilaterally.  Etiology is uncertain.  He does not smoke.  Systemic embolization [place] atherosclerotic or septic should be considered.  Need to exclude the possibility of vasculitis.  We will plan a transesophageal echo to exclude endocarditis.  Bilateral carotid Doppler and subclavian investigation with Doppler.  We need to have a sed rate and serologic evaluation for vasculitis.  Concerning prognosis.   Medication Adjustments/Labs and Tests Ordered: Current medicines are reviewed at length with the patient  today.  Concerns regarding medicines are outlined above.  Orders Placed This Encounter  Procedures  .  EKG 12-Lead   No orders of the defined types were placed in this encounter.   Patient Instructions  Medication Instructions:  Your physician recommends that you continue on your current medications as directed. Please refer to the Current Medication list given to you today.  Labwork: None  Testing/Procedures: Your physician has requested that you have a carotid duplex. This test is an ultrasound of the carotid arteries in your neck. It looks at blood flow through these arteries that supply the brain with blood. Allow one hour for this exam. There are no restrictions or special instructions.   Your physician has requested that you have a TEE. During a TEE, sound waves are used to create images of your heart. It provides your doctor with information about the size and shape of your heart and how well your heart's chambers and valves are working. In this test, a transducer is attached to the end of a flexible tube that's guided down your throat and into your esophagus (the tube leading from you mouth to your stomach) to get a more detailed image of your heart. You are not awake for the procedure. Please see the instruction sheet given to you today. For further information please visit [email or web address].    Follow-Up: Your physician wants you to follow-up in: 6 months with Dr. Tamala Julian.  You will receive a reminder letter in the mail two months in advance. If you don't receive a letter, please call our office to schedule the follow-up appointment.   Any Other Special Instructions Will Be Listed Below (If Applicable).     If you need a refill on your cardiac medications before your next appointment, please call your pharmacy.      Signed, Sinclair Grooms, MD  10/12/2017 1:07 PM    [place]

## 2017-10-11 DIAGNOSIS — D631 Anemia in chronic kidney disease: Secondary | ICD-10-CM | POA: Diagnosis not present

## 2017-10-11 DIAGNOSIS — N2581 Secondary hyperparathyroidism of renal origin: Secondary | ICD-10-CM | POA: Diagnosis not present

## 2017-10-11 DIAGNOSIS — N186 End stage renal disease: Secondary | ICD-10-CM | POA: Diagnosis not present

## 2017-10-11 DIAGNOSIS — E785 Hyperlipidemia, unspecified: Secondary | ICD-10-CM | POA: Diagnosis not present

## 2017-10-12 ENCOUNTER — Ambulatory Visit (INDEPENDENT_AMBULATORY_CARE_PROVIDER_SITE_OTHER): Payer: Medicare Other | Admitting: Interventional Cardiology

## 2017-10-12 ENCOUNTER — Encounter: Payer: Self-pay | Admitting: *Deleted

## 2017-10-12 ENCOUNTER — Other Ambulatory Visit: Payer: Self-pay

## 2017-10-12 ENCOUNTER — Encounter: Payer: Self-pay | Admitting: Interventional Cardiology

## 2017-10-12 VITALS — BP 94/60 | HR 101 | Ht 65.0 in | Wt 243.0 lb

## 2017-10-12 DIAGNOSIS — N186 End stage renal disease: Secondary | ICD-10-CM | POA: Diagnosis not present

## 2017-10-12 DIAGNOSIS — I749 Embolism and thrombosis of unspecified artery: Secondary | ICD-10-CM

## 2017-10-12 DIAGNOSIS — Z992 Dependence on renal dialysis: Secondary | ICD-10-CM

## 2017-10-12 DIAGNOSIS — I10 Essential (primary) hypertension: Secondary | ICD-10-CM

## 2017-10-12 DIAGNOSIS — I776 Arteritis, unspecified: Secondary | ICD-10-CM

## 2017-10-12 DIAGNOSIS — E7800 Pure hypercholesterolemia, unspecified: Secondary | ICD-10-CM

## 2017-10-12 DIAGNOSIS — I5022 Chronic systolic (congestive) heart failure: Secondary | ICD-10-CM

## 2017-10-12 NOTE — Patient Instructions (Addendum)
Medication Instructions:  Your physician recommends that you continue on your current medications as directed. Please refer to the Current Medication list given to you today.  Labwork: None  Testing/Procedures: Your physician has requested that you have a carotid duplex. This test is an ultrasound of the carotid arteries in your neck. It looks at blood flow through these arteries that supply the brain with blood. Allow one hour for this exam. There are no restrictions or special instructions.   Your physician has requested that you have a TEE. During a TEE, sound waves are used to create images of your heart. It provides your doctor with information about the size and shape of your heart and how well your heart's chambers and valves are working. In this test, a transducer is attached to the end of a flexible tube that's guided down your throat and into your esophagus (the tube leading from you mouth to your stomach) to get a more detailed image of your heart. You are not awake for the procedure. Please see the instruction sheet given to you today. For further information please visit [email or web address].    Follow-Up: Your physician wants you to follow-up in: 6 months with Dr. Tamala Julian.  You will receive a reminder letter in the mail two months in advance. If you don't receive a letter, please call our office to schedule the follow-up appointment.   Any Other Special Instructions Will Be Listed Below (If Applicable).     If you need a refill on your cardiac medications before your next appointment, please call your pharmacy.

## 2017-10-12 NOTE — Addendum Note (Signed)
Addended by: Loren Racer on: 10/12/2017 01:31 PM   Modules accepted: Orders

## 2017-10-13 ENCOUNTER — Encounter (HOSPITAL_COMMUNITY): Payer: Self-pay

## 2017-10-13 ENCOUNTER — Inpatient Hospital Stay (HOSPITAL_COMMUNITY)
Admission: EM | Admit: 2017-10-13 | Discharge: 2017-10-14 | DRG: 377 | Discharge status: Against Medical Advice | Payer: Medicare Other | Attending: Internal Medicine | Admitting: Internal Medicine

## 2017-10-13 DIAGNOSIS — I132 Hypertensive heart and chronic kidney disease with heart failure and with stage 5 chronic kidney disease, or end stage renal disease: Secondary | ICD-10-CM | POA: Diagnosis present

## 2017-10-13 DIAGNOSIS — E1122 Type 2 diabetes mellitus with diabetic chronic kidney disease: Secondary | ICD-10-CM | POA: Diagnosis present

## 2017-10-13 DIAGNOSIS — D631 Anemia in chronic kidney disease: Secondary | ICD-10-CM | POA: Diagnosis not present

## 2017-10-13 DIAGNOSIS — R000 Tachycardia, unspecified: Secondary | ICD-10-CM | POA: Diagnosis present

## 2017-10-13 DIAGNOSIS — I444 Left anterior fascicular block: Secondary | ICD-10-CM | POA: Diagnosis present

## 2017-10-13 DIAGNOSIS — E785 Hyperlipidemia, unspecified: Secondary | ICD-10-CM | POA: Diagnosis not present

## 2017-10-13 DIAGNOSIS — E1142 Type 2 diabetes mellitus with diabetic polyneuropathy: Secondary | ICD-10-CM | POA: Diagnosis present

## 2017-10-13 DIAGNOSIS — N2581 Secondary hyperparathyroidism of renal origin: Secondary | ICD-10-CM | POA: Diagnosis not present

## 2017-10-13 DIAGNOSIS — Z992 Dependence on renal dialysis: Secondary | ICD-10-CM | POA: Diagnosis not present

## 2017-10-13 DIAGNOSIS — M109 Gout, unspecified: Secondary | ICD-10-CM | POA: Diagnosis present

## 2017-10-13 DIAGNOSIS — I428 Other cardiomyopathies: Secondary | ICD-10-CM

## 2017-10-13 DIAGNOSIS — Z7982 Long term (current) use of aspirin: Secondary | ICD-10-CM | POA: Diagnosis not present

## 2017-10-13 DIAGNOSIS — E7800 Pure hypercholesterolemia, unspecified: Secondary | ICD-10-CM | POA: Diagnosis present

## 2017-10-13 DIAGNOSIS — K921 Melena: Secondary | ICD-10-CM | POA: Diagnosis not present

## 2017-10-13 DIAGNOSIS — N186 End stage renal disease: Secondary | ICD-10-CM | POA: Diagnosis present

## 2017-10-13 DIAGNOSIS — Z794 Long term (current) use of insulin: Secondary | ICD-10-CM

## 2017-10-13 DIAGNOSIS — I5022 Chronic systolic (congestive) heart failure: Secondary | ICD-10-CM

## 2017-10-13 DIAGNOSIS — K920 Hematemesis: Secondary | ICD-10-CM | POA: Diagnosis not present

## 2017-10-13 DIAGNOSIS — K922 Gastrointestinal hemorrhage, unspecified: Secondary | ICD-10-CM

## 2017-10-13 DIAGNOSIS — I255 Ischemic cardiomyopathy: Secondary | ICD-10-CM | POA: Diagnosis present

## 2017-10-13 DIAGNOSIS — Z79899 Other long term (current) drug therapy: Secondary | ICD-10-CM | POA: Diagnosis not present

## 2017-10-13 DIAGNOSIS — E875 Hyperkalemia: Secondary | ICD-10-CM | POA: Diagnosis present

## 2017-10-13 DIAGNOSIS — I5042 Chronic combined systolic (congestive) and diastolic (congestive) heart failure: Secondary | ICD-10-CM | POA: Diagnosis present

## 2017-10-13 DIAGNOSIS — I749 Embolism and thrombosis of unspecified artery: Secondary | ICD-10-CM | POA: Diagnosis present

## 2017-10-13 DIAGNOSIS — I10 Essential (primary) hypertension: Secondary | ICD-10-CM | POA: Diagnosis present

## 2017-10-13 LAB — CBC
HCT: 46 % (ref 39.0–52.0)
Hemoglobin: 14.6 g/dL (ref 13.0–17.0)
MCH: 34.4 pg — ABNORMAL HIGH (ref 26.0–34.0)
MCHC: 31.7 g/dL (ref 30.0–36.0)
MCV: 108.2 fL — ABNORMAL HIGH (ref 78.0–100.0)
Platelets: 292 10*3/uL (ref 150–400)
RBC: 4.25 MIL/uL (ref 4.22–5.81)
RDW: 14.7 % (ref 11.5–15.5)
WBC: 9.2 10*3/uL (ref 4.0–10.5)

## 2017-10-13 LAB — I-STAT CHEM 8, ED
BUN: 49 mg/dL — ABNORMAL HIGH (ref 6–20)
Calcium, Ion: 1 mmol/L — ABNORMAL LOW (ref 1.15–1.40)
Chloride: 99 mmol/L (ref 98–111)
Creatinine, Ser: 10.5 mg/dL — ABNORMAL HIGH (ref 0.61–1.24)
Glucose, Bld: 193 mg/dL — ABNORMAL HIGH (ref 70–99)
HCT: 47 % (ref 39.0–52.0)
Hemoglobin: 16 g/dL (ref 13.0–17.0)
Potassium: 5.7 mmol/L — ABNORMAL HIGH (ref 3.5–5.1)
Sodium: 136 mmol/L (ref 135–145)
TCO2: 26 mmol/L (ref 22–32)

## 2017-10-13 LAB — BASIC METABOLIC PANEL
Anion gap: 14 (ref 5–15)
BUN: 48 mg/dL — ABNORMAL HIGH (ref 6–20)
CO2: 26 mmol/L (ref 22–32)
Calcium: 8.8 mg/dL — ABNORMAL LOW (ref 8.9–10.3)
Chloride: 97 mmol/L — ABNORMAL LOW (ref 98–111)
Creatinine, Ser: 10.15 mg/dL — ABNORMAL HIGH (ref 0.61–1.24)
GFR calc Af Amer: 6 mL/min — ABNORMAL LOW (ref >60)
GFR calc non Af Amer: 5 mL/min — ABNORMAL LOW (ref >60)
Glucose, Bld: 197 mg/dL — ABNORMAL HIGH (ref 70–99)
Potassium: 5.9 mmol/L — ABNORMAL HIGH (ref 3.5–5.1)
Sodium: 137 mmol/L (ref 135–145)

## 2017-10-13 LAB — HEPATIC FUNCTION PANEL
ALT: 14 U/L (ref 0–44)
AST: 17 U/L (ref 15–41)
Albumin: 3.1 g/dL — ABNORMAL LOW (ref 3.5–5.0)
Alkaline Phosphatase: 70 U/L (ref 38–126)
Bilirubin, Direct: 0.2 mg/dL (ref 0.0–0.2)
Indirect Bilirubin: 0.4 mg/dL (ref 0.3–0.9)
Total Bilirubin: 0.6 mg/dL (ref 0.3–1.2)
Total Protein: 6.7 g/dL (ref 6.5–8.1)

## 2017-10-13 LAB — PROTIME-INR
INR: 1
Prothrombin Time: 13.1 seconds (ref 11.4–15.2)

## 2017-10-13 LAB — TYPE AND SCREEN
ABO/RH(D): O POS
Antibody Screen: NEGATIVE

## 2017-10-13 LAB — ABO/RH: ABO/RH(D): O POS

## 2017-10-13 LAB — I-STAT CG4 LACTIC ACID, ED: Lactic Acid, Venous: 1.94 mmol/L — ABNORMAL HIGH (ref 0.5–1.9)

## 2017-10-13 LAB — POC OCCULT BLOOD, ED: Fecal Occult Bld: POSITIVE — AB

## 2017-10-13 LAB — CBG MONITORING, ED: Glucose-Capillary: 204 mg/dL — ABNORMAL HIGH (ref 70–99)

## 2017-10-13 MED ORDER — ACETAMINOPHEN 650 MG RE SUPP: 650.0000 mg | Freq: Four times a day (QID) | RECTAL | Status: DC | PRN

## 2017-10-13 MED ORDER — ONDANSETRON HCL 4 MG/2ML IJ SOLN: 4.0000 mg | Freq: Four times a day (QID) | INTRAMUSCULAR | Status: DC | PRN

## 2017-10-13 MED ORDER — FEBUXOSTAT 40 MG PO TABS
40.0000 mg | ORAL_TABLET | Freq: Every day | ORAL | Status: DC
  Filled 2017-10-13: qty 1

## 2017-10-13 MED ORDER — CYCLOSPORINE 0.05 % OP EMUL
1.0000 [drp] | Freq: Two times a day (BID) | OPHTHALMIC | Status: DC
  Filled 2017-10-13: qty 1

## 2017-10-13 MED ORDER — ATORVASTATIN CALCIUM 10 MG PO TABS
10.0000 mg | ORAL_TABLET | Freq: Every day | ORAL | Status: DC
  Filled 2017-10-13: qty 1

## 2017-10-13 MED ORDER — SODIUM CHLORIDE 0.9 % IV SOLN
8.0000 mg/h | INTRAVENOUS | Status: DC
  Administered 2017-10-13: 8 mg/h via INTRAVENOUS
  Filled 2017-10-13 (×3): qty 80

## 2017-10-13 MED ORDER — SODIUM CHLORIDE 0.9 % IV SOLN: INTRAVENOUS | Status: DC

## 2017-10-13 MED ORDER — INSULIN ASPART 100 UNIT/ML ~~LOC~~ SOLN
0.0000 [IU] | SUBCUTANEOUS | Status: DC
  Administered 2017-10-14 (×2): 2 [IU] via SUBCUTANEOUS

## 2017-10-13 MED ORDER — SODIUM CHLORIDE 0.9 % IV BOLUS
1000.0000 mL | Freq: Once | INTRAVENOUS | Status: AC
  Administered 2017-10-13: 1000 mL via INTRAVENOUS

## 2017-10-13 MED ORDER — CINACALCET HCL 30 MG PO TABS
60.0000 mg | ORAL_TABLET | Freq: Every day | ORAL | Status: DC
  Filled 2017-10-13: qty 2

## 2017-10-13 MED ORDER — SODIUM CHLORIDE 0.9 % IV SOLN
80.0000 mg | Freq: Once | INTRAVENOUS | Status: AC
  Administered 2017-10-13: 80 mg via INTRAVENOUS
  Filled 2017-10-13: qty 80

## 2017-10-13 MED ORDER — SEVELAMER CARBONATE 800 MG PO TABS
2400.0000 mg | ORAL_TABLET | Freq: Three times a day (TID) | ORAL | Status: DC
  Filled 2017-10-13: qty 3

## 2017-10-13 MED ORDER — ONDANSETRON 4 MG PO TBDP
4.0000 mg | ORAL_TABLET | Freq: Once | ORAL | Status: AC
  Administered 2017-10-13: 4 mg via ORAL
  Filled 2017-10-13: qty 1

## 2017-10-13 MED ORDER — ONDANSETRON HCL 4 MG PO TABS: 4.0000 mg | ORAL_TABLET | Freq: Four times a day (QID) | ORAL | Status: DC | PRN

## 2017-10-13 MED ORDER — ACETAMINOPHEN 325 MG PO TABS: 650.0000 mg | ORAL_TABLET | Freq: Four times a day (QID) | ORAL | Status: DC | PRN

## 2017-10-13 NOTE — H&P (Addendum)
History and Physical    [name] [name] GQQ:[id number] DOB: [birth year] DOA: 10/13/2017  PCP: [name], MD  Patient coming from: Home.  Chief Complaint: Hematemesis.  HPI: [name] [name] is a 52 y.o. male with history of ESRD on hemodialysis on Tuesday Thursday Saturday, nonischemic cardiomyopathy, hyperlipidemia, gout, diabetes mellitus type 2 who has been recently experiencing necrosis of the tips of his both index fingers and has followed up with cardiologist 2 days ago and has been planned to have further work-up including TEE scheduled next week Monday with Dr. Daneen Schick had dialysis today following which patient started having abdominal discomfort and nausea and threw up 3 times which was dark-colored fluid.  Has not noticed any blood in the stools.  Denies taking any NSAIDs.  Is on aspirin.  Patient felt slightly dizzy.  ED Course: On arrival the patient was tachycardic.  Hemoglobin was at baseline.  Stool for occult blood was positive.   ER physician discussed with on-call gastroenterologist Dr. Penelope Coop who advised to keep patient on Protonix infusion and will be seeing patient in consult for EGD.  Review of Systems: As per HPI, rest all negative.   Past Medical History:  Diagnosis Date  . Anal infection    posterior anal canal  . Anemia, chronic renal failure   . Atrophic kidney    BILATERAL  . DM type 2 causing ESRD [facility])    Nephrologist-- dr [name] [facility])--  on hemodialysis since June 2012 at  [name]  TTS  . Hemodialysis patient [facility])    at [place] on Tues/ Thur/Sat/schedule  . Hemorrhoids   . Hepatitis B antibody positive   . History of pleural effusion    bilateral  . Hyperparathyroidism, secondary renal ([place])   . Hypertension   . Ischemic cardiomyopathy    per echo 07-01-2014  ef 45%  . LAFB (left anterior fascicular block)   . Peripheral neuropathy   . Systolic and diastolic CHF, chronic ([place])    CARDIOLOGIST-  DR  Daneen Schick ([place])  AND DR [name] ([name])    Past Surgical History:  Procedure Laterality Date  . APPENDECTOMY  09-12-2004   laparotomy w/ drainage peritinitis  . AV FISTULA PLACEMENT  02-27-2010   right forearm (RADIOCEPHALIC)  . AV FISTULA REPAIR  10-30-2010  . CARDIOVASCULAR STRESS TEST  10-29-2011   dr Daneen Schick   Low risk scan;  mild perfusion defect seen in the basal inferoseptal, basal inferior and mid inferior regions consistent with an infarct/scar and/or overlying attenuation/  mild to moderate global LVSF,  ef 40-45%  . DOBUTAMINE STRESS ECHO  07-23-2012   [name]   abnormal ;  at rest estimated lvef 25-30% and global severe LV hypokinesis ;  no cp during stress and achieved 85% maxium predicted heart rate;  negative stress ECG for inducible ischemia;  estimated lvef with stress 35-40%;  augmentation of wall segments consistant with cardiomyopathy and differential fibrosis  . FISTULOTOMY N/A 03/26/2015   Procedure: FISTULOTOMY;  Surgeon: [name], MD;  Location: [facility];  Service: General;  Laterality: N/A;  . INCISION AND DRAINAGE ABSCESS N/A 03/26/2015   Procedure: ANAL INCISION AND DRAINAGE;  Surgeon: [name], MD;  Location: [place];  Service: General;  Laterality: N/A;  . RETINAL DETACHMENT SURGERY Left 2011   incomplete repair/ needs eye drops to keep pressure down  . TRANSTHORACIC ECHOCARDIOGRAM  07-01-2014    done at  [facility]   grade 1 diastolic dysfunction,  ef 45%/  trace TR and PR     reports that he has never smoked. He has never used smokeless tobacco. He reports that he does not drink alcohol or use drugs.  No Known Allergies  Family History  Family history unknown: Yes    Prior to Admission medications   Medication Sig Start Date End Date Taking? Authorizing Provider  aspirin 325 MG tablet Take 325 mg by mouth daily.   Yes [provider]    cinacalcet (SENSIPAR) 60 MG tablet Take 60 mg by mouth daily.   Yes [provider]  cycloSPORINE (RESTASIS) 0.05 % ophthalmic emulsion Place 1 drop into both eyes 2 (two) times daily. 04/22/14  Yes [provider]  febuxostat (ULORIC) 40 MG tablet Take 40 mg by mouth daily.    Yes [provider]  gabapentin (NEURONTIN) 300 MG capsule Take 300 mg by mouth 3 (three) times daily as needed. For pain 05/09/15  Yes [provider]  Insulin Aspart Prot & Aspart (NOVOLOG MIX 70/30 PENFILL [place]) Inject 14-16 Units into the skin See admin instructions. Inject 16 units into the skin each morning and 14 units into the skin each evening before meals. Adjusts dose if CBG greater than 150   Yes [provider]  lidocaine (XYLOCAINE) 5 % ointment Apply 1 application topically 3 (three) times daily as needed. (Finger Pain) 09/27/17  Yes [provider]  LIPITOR 10 MG tablet Take 10 mg by mouth daily. 01/08/16  Yes [provider]  midodrine (PROAMATINE) 5 MG tablet Take 5 mg by mouth Every Tuesday,Thursday,and Saturday with dialysis.  10/06/17  Yes [provider]  Multiple Vitamins-Minerals (MEGA MULTIVITAMIN FOR MEN PO) Take 1 tablet by mouth daily.    Yes [provider]  sevelamer carbonate (RENVELA) 800 MG tablet Take 2,400 mg by mouth 3 (three) times daily with meals.    Yes [provider]  calcium acetate (PHOSLO) 667 MG capsule Take 2,001 mg by mouth 3 (three) times daily with meals.  06/02/10   [provider]    Physical Exam: Vitals:   10/13/17 1931 10/13/17 2149 10/13/17 2210 10/13/17 2245  BP:  (!) 102/57 (!) 141/57 136/80  Pulse: (!) 125 (!) 117 (!) 125 (!) 120  Resp:  20 19 (!) 23  Temp:      SpO2: 95% 93% 100% 97%      Constitutional: Moderately built and nourished. Vitals:   10/13/17 1931 10/13/17 2149 10/13/17 2210 10/13/17 2245  BP:  (!) 102/57 (!) 141/57 136/80  Pulse: (!) 125 (!) 117 (!)  125 (!) 120  Resp:  20 19 (!) 23  Temp:      SpO2: 95% 93% 100% 97%   Eyes: Anicteric no pallor. ENMT: No discharge from the ears eyes nose or mouth. Neck: No mass felt.  No neck rigidity. Respiratory: No rhonchi or crepitations. Cardiovascular: S1-S2 heard no murmurs appreciated. Abdomen: Soft nontender bowel sounds present. Musculoskeletal: Left hand index finger is being dressed.   Skin: Left hand index finger is dressed. Neurologic: Alert awake oriented to time place and person.  Moves all extremities. Psychiatric: Appears normal.  Normal affect.   Labs on Admission: I have personally reviewed following labs and imaging studies  CBC: Recent Labs  Lab 10/13/17 1941 10/13/17 1949  WBC 9.2  --   HGB 14.6 16.0  HCT 46.0 47.0  MCV 108.2*  --   PLT 292  --  Basic Metabolic Panel: Recent Labs  Lab 10/13/17 1941 10/13/17 1949  NA 137 136  K 5.9* 5.7*  CL 97* 99  CO2 26  --   GLUCOSE 197* 193*  BUN 48* 49*  CREATININE 10.15* 10.50*  CALCIUM 8.8*  --    GFR: Estimated Creatinine Clearance: 9.5 mL/min (A) (by C-G formula based on SCr of 10.5 mg/dL (H)). Liver Function Tests: Recent Labs  Lab 10/13/17 2207  AST 17  ALT 14  ALKPHOS 70  BILITOT 0.6  PROT 6.7  ALBUMIN 3.1*   No results for input(s): LIPASE, AMYLASE in the last 168 hours. No results for input(s): AMMONIA in the last 168 hours. Coagulation Profile: Recent Labs  Lab 10/13/17 2207  INR 1.00   Cardiac Enzymes: No results for input(s): CKTOTAL, CKMB, CKMBINDEX, TROPONINI in the last 168 hours. BNP (last 3 results) No results for input(s): PROBNP in the last 8760 hours. HbA1C: No results for input(s): HGBA1C in the last 72 hours. CBG: Recent Labs  Lab 10/13/17 1937  GLUCAP 204*   Lipid Profile: No results for input(s): CHOL, HDL, LDLCALC, TRIG, CHOLHDL, LDLDIRECT in the last 72 hours. Thyroid Function Tests: No results for input(s): TSH, T4TOTAL, FREET4, T3FREE, THYROIDAB in the last 72  hours. Anemia Panel: No results for input(s): VITAMINB12, FOLATE, FERRITIN, TIBC, IRON, RETICCTPCT in the last 72 hours. Urine analysis: No results found for: COLORURINE, APPEARANCEUR, LABSPEC, PHURINE, GLUCOSEU, HGBUR, BILIRUBINUR, KETONESUR, PROTEINUR, UROBILINOGEN, NITRITE, LEUKOCYTESUR Sepsis Labs: @LABRCNTIP (procalcitonin:4,lacticidven:4) )No results found for this or any previous visit (from the past 240 hour(s)).   Radiological Exams on Admission: No results found.  EKG: Independently reviewed.  Sinus tachycardia.  Assessment/Plan Principal Problem:   Acute GI bleeding Active Problems:   Essential hypertension   Chronic systolic heart failure ([name])   Non-ischemic cardiomyopathy ([name])   Embolism, arterial ([name])   ESRD on hemodialysis ([place])    1. Acute GI bleeding -patient placed on Protonix infusion.  Follow CBC.  Dr. Narda Amber gastroenterologist has been consulted.  Will keep patient n.p.o. except medications.  Hold aspirin for now. 2. Necrotic tips of both index fingers being followed by cardiologist who originally planned to have TEE next week.  May notify cardiologist of her admission. 3. ESRD on hemodialysis on Tuesday Thursday and Saturday.  Had dialysis today. 4. History of gout on [place]. 5. Diabetes mellitus type 2 we will keep patient on sliding scale coverage. 6. Nonischemic cardiomyopathy volume managed by nephrology during dialysis. 7. Persistent sinus tachycardia -follow CBC and also check TSH.  Not hypoxic or febrile at this time.   DVT prophylaxis: SCDs. Code Status: Full code. Family Communication: Discussed with patient. Disposition Plan: Home. Consults called: Gastroenterologist Dr. Narda Amber.  Admission status: Inpatient.   Rise Patience MD [facility] Pager [phone].  If 7PM-7AM, please contact night-coverage [email or web address] Password [facility]  10/13/2017, 11:28 PM

## 2017-10-13 NOTE — ED Notes (Signed)
Attempted report 

## 2017-10-13 NOTE — ED Triage Notes (Signed)
Pt reports that he went to dialysis today, received full treatment. This afternoon began to throw up dark red blood x 3 and then has dizziness. Denies abd pain.

## 2017-10-13 NOTE — ED Provider Notes (Signed)
[place] EMERGENCY DEPARTMENT Provider Note   CSN: [id number] Arrival date & time: 10/13/17  1913  History   Chief Complaint Chief Complaint  Patient presents with  . Dizziness  . Hematemesis   HPI  Patient is a 52 year old male with history of ESRD on HD TTS and CHF presented to the ED for hematemesis.  He states he was resting this afternoon after completing dialysis when he had abrupt onset of 3 episodes of vomiting with vomitus appearing dark red.  He states he had not been vomiting prior to this and had just finished eating oatmeal.  He had been in his usual state of health prior to these episodes but no recent abdominal pain, diarrhea, constipation, fever, or other recent illness.  He is no longer nauseated.  He states he had a normal brown bowel movement earlier today.  He no longer makes urine.  He reports adherence to his dialysis regimen.  No other complaints.  Past Medical History:  Diagnosis Date  . Anal infection    posterior anal canal  . Anemia, chronic renal failure   . Atrophic kidney    BILATERAL  . DM type 2 causing ESRD [facility])    Nephrologist-- dr [name] [facility])--  on hemodialysis since June 2012 at  [name]  TTS  . Hemodialysis patient [facility])    at [place] on Tues/ Thur/Sat/schedule  . Hemorrhoids   . Hepatitis B antibody positive   . History of pleural effusion    bilateral  . Hyperparathyroidism, secondary renal ([place])   . Hypertension   . Ischemic cardiomyopathy    per echo 07-01-2014  ef 45%  . LAFB (left anterior fascicular block)   . Peripheral neuropathy   . Systolic and diastolic CHF, chronic ([place])    CARDIOLOGIST-  DR Daneen Schick ([place])  AND DR [name] ([name])    Patient Active Problem List   Diagnosis Date Noted  . Acute GI bleeding 10/13/2017  . Embolism, arterial ([place]) 10/12/2017  . Acute encephalopathy 03/13/2016  . Acute hyperkalemia 03/13/2016    . Diabetes mellitus type 2, uncontrolled ([place]) 05/03/2014  . Non-ischemic cardiomyopathy ([place]) 05/03/2014  . HYPERCHOLESTEROLEMIA 08/19/2009  . Essential hypertension 08/19/2009  . Chronic systolic heart failure ([place]) 08/19/2009  . CKD (chronic kidney disease) stage V requiring chronic dialysis ([place]) 08/19/2009  . Depression 08/19/2009    Past Surgical History:  Procedure Laterality Date  . APPENDECTOMY  09-12-2004   laparotomy w/ drainage peritinitis  . AV FISTULA PLACEMENT  02-27-2010   right forearm (RADIOCEPHALIC)  . AV FISTULA REPAIR  10-30-2010  . CARDIOVASCULAR STRESS TEST  10-29-2011   dr Daneen Schick   Low risk scan;  mild perfusion defect seen in the basal inferoseptal, basal inferior and mid inferior regions consistent with an infarct/scar and/or overlying attenuation/  mild to moderate global LVSF,  ef 40-45%  . DOBUTAMINE STRESS ECHO  07-23-2012   [name]   abnormal ;  at rest estimated lvef 25-30% and global severe LV hypokinesis ;  no cp during stress and achieved 85% maxium predicted heart rate;  negative stress ECG for inducible ischemia;  estimated lvef with stress 35-40%;  augmentation of wall segments consistant with cardiomyopathy and differential fibrosis  . FISTULOTOMY N/A 03/26/2015   Procedure: FISTULOTOMY;  Surgeon: [name], MD;  [facility];  Service: General;  Laterality: N/A;  . INCISION AND DRAINAGE ABSCESS N/A 03/26/2015   Procedure: ANAL  INCISION AND DRAINAGE;  Surgeon: [name], MD;  Location: [facility];  Service: General;  Laterality: N/A;  . RETINAL DETACHMENT SURGERY Left 2011   incomplete repair/ needs eye drops to keep pressure down  . TRANSTHORACIC ECHOCARDIOGRAM  07-01-2014    done at [facility]   grade 1 diastolic dysfunction,  ef 45%/  trace TR and PR        Home Medications    Prior to Admission medications   Medication Sig Start Date End Date Taking? Authorizing Provider   aspirin 325 MG tablet Take 325 mg by mouth daily.   Yes [provider]  cinacalcet (SENSIPAR) 60 MG tablet Take 60 mg by mouth daily.   Yes [provider]  cycloSPORINE (RESTASIS) 0.05 % ophthalmic emulsion Place 1 drop into both eyes 2 (two) times daily. 04/22/14  Yes [provider]  febuxostat (ULORIC) 40 MG tablet Take 40 mg by mouth daily.    Yes [provider]  gabapentin (NEURONTIN) 300 MG capsule Take 300 mg by mouth 3 (three) times daily as needed. For pain 05/09/15  Yes [provider]  Insulin Aspart Prot & Aspart (NOVOLOG MIX 70/30 PENFILL [place]) Inject 14-16 Units into the skin See admin instructions. Inject 16 units into the skin each morning and 14 units into the skin each evening before meals. Adjusts dose if CBG greater than 150   Yes [provider]  lidocaine (XYLOCAINE) 5 % ointment Apply 1 application topically 3 (three) times daily as needed. (Finger Pain) 09/27/17  Yes [provider]  LIPITOR 10 MG tablet Take 10 mg by mouth daily. 01/08/16  Yes [provider]  midodrine (PROAMATINE) 5 MG tablet Take 5 mg by mouth Every Tuesday,Thursday,and Saturday with dialysis.  10/06/17  Yes [provider]  Multiple Vitamins-Minerals (MEGA MULTIVITAMIN FOR MEN PO) Take 1 tablet by mouth daily.    Yes [provider]  sevelamer carbonate (RENVELA) 800 MG tablet Take 2,400 mg by mouth 3 (three) times daily with meals.    Yes [provider]  calcium acetate (PHOSLO) 667 MG capsule Take 2,001 mg by mouth 3 (three) times daily with meals.  06/02/10   [provider]    Family History Family History  Family history unknown: Yes    Social History Social History   Tobacco Use  . Smoking status: Never Smoker  . Smokeless tobacco: Never Used  Substance Use Topics  . Alcohol use: No  . Drug use: No     Allergies   Patient has no known allergies.   Review of Systems Review of  Systems  Constitutional: Negative for fever.  HENT: Negative for congestion.   Eyes: Negative for visual disturbance.  Respiratory: Negative for cough and shortness of breath.   Cardiovascular: Negative for chest pain.  Gastrointestinal: Positive for vomiting. Negative for abdominal pain, blood in stool and diarrhea.  Genitourinary: Negative for dysuria.  Musculoskeletal: Negative for back pain.  Skin: Negative for rash.  Neurological: Negative for headaches.  All other systems reviewed and are negative.    Physical Exam Updated Vital Signs BP (!) 102/57   Pulse (!) 117   Temp 98 F (36.7 C)   Resp 20   SpO2 93%   Physical Exam  Constitutional: He is oriented to person, place, and time. No distress.  HENT:  Head: Normocephalic and atraumatic.  Mouth/Throat: Oropharynx is clear and moist.  Eyes: Pupils are equal, round, and reactive to light.  Neck: Neck supple. No JVD present.  Cardiovascular: Regular rhythm, normal heart sounds and intact distal pulses. Tachycardia present.  No murmur heard. AV fistula present in the right forearm with good thrill  Pulmonary/Chest: Breath sounds normal. No respiratory distress. He has no wheezes. He has no rales.  Abdominal: Soft. He exhibits no distension and no mass. There is no tenderness. There is no guarding.  Genitourinary:  Genitourinary Comments: Gross melena on DRE  Musculoskeletal: Normal range of motion. He exhibits no edema.  Neurological: He is alert and oriented to person, place, and time.  Skin: Skin is warm and dry.  Psychiatric: He has a normal mood and affect. His behavior is normal.  Nursing note and vitals reviewed.    ED Treatments / Results  Labs (all labs ordered are listed, but only abnormal results are displayed) Labs Reviewed  BASIC METABOLIC PANEL - Abnormal; Notable for the following components:      Result Value   Potassium 5.9 (*)    Chloride 97 (*)    Glucose, Bld 197 (*)    BUN 48 (*)     Creatinine, Ser 10.15 (*)    Calcium 8.8 (*)    GFR calc non Af Amer 5 (*)    GFR calc Af Amer 6 (*)    All other components within normal limits  CBC - Abnormal; Notable for the following components:   MCV 108.2 (*)    MCH 34.4 (*)    All other components within normal limits  CBG MONITORING, ED - Abnormal; Notable for the following components:   Glucose-Capillary 204 (*)    All other components within normal limits  I-STAT CHEM 8, ED - Abnormal; Notable for the following components:   Potassium 5.7 (*)    BUN 49 (*)    Creatinine, Ser 10.50 (*)    Glucose, Bld 193 (*)    Calcium, Ion 1.00 (*)    All other components within normal limits  I-STAT CG4 LACTIC ACID, ED - Abnormal; Notable for the following components:   Lactic Acid, Venous 1.94 (*)    All other components within normal limits  POC OCCULT BLOOD, ED - Abnormal; Notable for the following components:   Fecal Occult Bld POSITIVE (*)    All other components within normal limits  URINALYSIS, ROUTINE W REFLEX MICROSCOPIC  PROTIME-INR  HEPATIC FUNCTION PANEL  TYPE AND SCREEN  ABO/RH    EKG EKG Interpretation  Date/Time:  Thursday October 13 2017 19:34:06 EDT Ventricular Rate:  129 PR Interval:  130 QRS Duration: 78 QT Interval:  298 QTC Calculation: 436 R Axis:   139 Text Interpretation:  Sinus tachycardia Right axis deviation Anterior infarct , age undetermined Abnormal ECG No significant change since last tracing Confirmed by Isla Pence [phone]) on 10/13/2017 8:53:51 PM   Radiology No results found.  Procedures Procedures (including critical care time)  Medications Ordered in ED Medications  pantoprazole (PROTONIX) 80 mg in sodium chloride 0.9 % 100 mL IVPB (80 mg Intravenous New Bag/Given 10/13/17 2149)  pantoprazole (PROTONIX) 80 mg in sodium chloride 0.9 % 250 mL (0.32 mg/mL) infusion (has no administration in time range)  sodium chloride 0.9 % bolus 1,000 mL (1,000 mLs Intravenous New Bag/Given 10/13/17  2147)    And  0.9 %  sodium chloride infusion (has no administration in time range)  ondansetron (ZOFRAN-ODT) disintegrating tablet 4 mg (4 mg Oral Given 10/13/17 2054)     Initial Impression / Assessment and Plan / ED Course  I have reviewed the triage vital signs and the nursing notes.  Pertinent labs & imaging results that were available during my care of the patient were reviewed by me and considered in my medical decision making (see chart for details).  This is a 52 year old male with history of ESRD on HD TTS presenting to the ED for likely new upper GI bleed as above.  He arrived tachycardic after his episodes of hematemesis which seemed to resolve.  BP does show wide pulse pressure but his sBP is normal and he takes midodrine, complicating his hemodynamics.  Hemoglobin is normal.  Patient started on IV Protonix and will give judicious fluid resuscitation with IV NS.  Feel emergent transfusion warranted at this time, however type and screen sent.  His chemistry also showed hyperkalemia to 5.9.  As his hyperkalemia is mild and his EKG shows no changes, will defer temporizing measures.  He will likely need dialysis as an inpatient.  Case reviewed with on-call GI Dr. Penelope Coop who agreed with IV PPI and recommends keeping patient n.p.o. overnight in anticipation of possible endoscopy tomorrow.  Patient admitted to hospitalist in stepdown unit for further management.  Final Clinical Impressions(s) / ED Diagnoses   Final diagnoses:  Hematemesis with nausea  Melena      Prescilla Sours, MD 10/13/17 2210    Isla Pence, MD 10/13/17 [phone]

## 2017-10-13 NOTE — ED Provider Notes (Signed)
MSE was initiated and I personally evaluated the patient and placed orders (if any) at  7:28 PM on October 13, 2017.  The patient appears stable so that the remainder of the MSE may be completed by another provider.  Patient placed in Quick Look pathway, seen and evaluated   Chief Complaint: Hematemesis, lightheadedness, dizziness  HPI:   52 year old male with a past medical history of diabetes, ESRD on dialysis Tu/Th/Sa, presents to ED for evaluation of 3 episodes of hematemesis earlier today.  He completed his entire treatment of dialysis today without difficulty.  Reported some generalized abdominal pain when he had the vomiting.  Had a normal bowel movement today with no blood.  States that he takes 325 of aspirin daily.  Denies any loss of consciousness, chest pain, hemoptysis, sick contacts with similar symptoms.  He does endorse intermittent ibuprofen use for pain associated with his left finger.  ROS: Hematemesis  Physical Exam:   Gen: No distress  Neuro: Awake and Alert  Skin: Warm    Focused Exam: No tenderness to palpation of the abdomen. Lungs CTAB. RRR.   Initiation of care has begun. The patient has been counseled on the process, plan, and necessity for staying for the completion/evaluation, and the remainder of the medical screening examination    Delia Heady, PA-C 10/13/17 1930    Isla Pence, MD 10/13/17 [phone]

## 2017-10-14 ENCOUNTER — Other Ambulatory Visit: Payer: Self-pay

## 2017-10-14 ENCOUNTER — Inpatient Hospital Stay (HOSPITAL_COMMUNITY): Payer: Medicare Other

## 2017-10-14 LAB — CBC
HCT: 39.7 % (ref 39.0–52.0)
HCT: 42.2 % (ref 39.0–52.0)
HCT: 42.3 % (ref 39.0–52.0)
Hemoglobin: 12.9 g/dL — ABNORMAL LOW (ref 13.0–17.0)
Hemoglobin: 13.2 g/dL (ref 13.0–17.0)
Hemoglobin: 13.3 g/dL (ref 13.0–17.0)
MCH: 33.8 pg (ref 26.0–34.0)
MCH: 33.9 pg (ref 26.0–34.0)
MCH: 35 pg — ABNORMAL HIGH (ref 26.0–34.0)
MCHC: 31.2 g/dL (ref 30.0–36.0)
MCHC: 31.5 g/dL (ref 30.0–36.0)
MCHC: 32.5 g/dL (ref 30.0–36.0)
MCV: 107.6 fL — ABNORMAL HIGH (ref 78.0–100.0)
MCV: 107.7 fL — ABNORMAL HIGH (ref 78.0–100.0)
MCV: 108.2 fL — ABNORMAL HIGH (ref 78.0–100.0)
Platelets: 230 10*3/uL (ref 150–400)
Platelets: 245 10*3/uL (ref 150–400)
Platelets: 262 10*3/uL (ref 150–400)
RBC: 3.69 MIL/uL — ABNORMAL LOW (ref 4.22–5.81)
RBC: 3.91 MIL/uL — ABNORMAL LOW (ref 4.22–5.81)
RBC: 3.92 MIL/uL — ABNORMAL LOW (ref 4.22–5.81)
RDW: 14.5 % (ref 11.5–15.5)
RDW: 14.6 % (ref 11.5–15.5)
RDW: 14.7 % (ref 11.5–15.5)
WBC: 7.5 10*3/uL (ref 4.0–10.5)
WBC: 8.1 10*3/uL (ref 4.0–10.5)
WBC: 8.4 10*3/uL (ref 4.0–10.5)

## 2017-10-14 LAB — GLUCOSE, CAPILLARY
Glucose-Capillary: 184 mg/dL — ABNORMAL HIGH (ref 70–99)
Glucose-Capillary: 189 mg/dL — ABNORMAL HIGH (ref 70–99)
Glucose-Capillary: 193 mg/dL — ABNORMAL HIGH (ref 70–99)

## 2017-10-14 LAB — BASIC METABOLIC PANEL
Anion gap: 15 (ref 5–15)
BUN: 75 mg/dL — ABNORMAL HIGH (ref 6–20)
CO2: 24 mmol/L (ref 22–32)
Calcium: 8.6 mg/dL — ABNORMAL LOW (ref 8.9–10.3)
Chloride: 100 mmol/L (ref 98–111)
Creatinine, Ser: 10.73 mg/dL — ABNORMAL HIGH (ref 0.61–1.24)
GFR calc Af Amer: 6 mL/min — ABNORMAL LOW (ref >60)
GFR calc non Af Amer: 5 mL/min — ABNORMAL LOW (ref >60)
Glucose, Bld: 182 mg/dL — ABNORMAL HIGH (ref 70–99)
Potassium: 5.4 mmol/L — ABNORMAL HIGH (ref 3.5–5.1)
Sodium: 139 mmol/L (ref 135–145)

## 2017-10-14 LAB — TSH: TSH: 0.224 u[IU]/mL — ABNORMAL LOW (ref 0.350–4.500)

## 2017-10-14 LAB — T4, FREE: Free T4: 0.96 ng/dL (ref 0.82–1.77)

## 2017-10-14 LAB — MRSA PCR SCREENING: MRSA by PCR: NEGATIVE

## 2017-10-14 LAB — HIV ANTIBODY (ROUTINE TESTING W REFLEX): HIV Screen 4th Generation wRfx: NONREACTIVE

## 2017-10-14 NOTE — Consult Note (Addendum)
            [facility] CM Primary Care Navigator  10/14/2017  [name] [name] 07/17/1965 [id number]   Attempt to see patient at the bedside to identify possible discharge needs buthe already left AMA- Against Medical Advice, earlier today.   Per chart review,patientpresented to the ED with hematemesis which occurred after dialysis yesterday; he had 3 bloody vomits prior to arrival and had frank melena on rectal exam.  Call placed to primary care provider's office (Dr. [name]) but office is close on Wednesdays and Fridays. Message left notifying ofpatient's discharge, need for post hospital follow-up and transition of care. Contact information was provided for further questions and follow-up.   For additional questions please contact:  Edwena Felty A. Tyleigh Mahn, BSN, RN-BC [facility] PRIMARY CARE Navigator Cell: [phone]

## 2017-10-14 NOTE — Progress Notes (Signed)
Patient stated, "I need to get out of here, I have things to do." Counseled patient that he has an echocardiogram ordered and the physician will be rounding shortly to update him on his care. Patient stated "that is too long to wait, I have things to do, I have not eaten, I have to leave." Patient advised that he would be leaving against medical advice, patient stated "I work for [facility] [employer], I know what that is." MD notified.   AMA signed at 0912, peripheral IVs removed and AMA form placed in chart.

## 2017-10-14 NOTE — Progress Notes (Signed)
HR reaching and sustaining 140s while ambulating/sitting on edge of bed. Otherwise HR 110s-120s. Pt asymptomatic of tachycardia. MD Purohit notified.

## 2017-10-15 DIAGNOSIS — N2581 Secondary hyperparathyroidism of renal origin: Secondary | ICD-10-CM | POA: Diagnosis not present

## 2017-10-15 DIAGNOSIS — N186 End stage renal disease: Secondary | ICD-10-CM | POA: Diagnosis not present

## 2017-10-15 DIAGNOSIS — D631 Anemia in chronic kidney disease: Secondary | ICD-10-CM | POA: Diagnosis not present

## 2017-10-15 DIAGNOSIS — E785 Hyperlipidemia, unspecified: Secondary | ICD-10-CM | POA: Diagnosis not present

## 2017-10-15 LAB — T3, FREE: T3, Free: 2.6 pg/mL (ref 2.0–4.4)

## 2017-10-15 LAB — T3: T3, Total: 91 ng/dL (ref 71–180)

## 2017-10-17 ENCOUNTER — Encounter (HOSPITAL_COMMUNITY): Payer: Self-pay

## 2017-10-17 ENCOUNTER — Ambulatory Visit (HOSPITAL_COMMUNITY)
Admission: RE | Admit: 2017-10-17 | Discharge: 2017-10-17 | Disposition: A | Discharge status: Home / Self Care | Payer: Medicare Other | Source: Ambulatory Visit | Attending: Cardiovascular Disease | Admitting: Cardiovascular Disease

## 2017-10-17 ENCOUNTER — Ambulatory Visit (HOSPITAL_BASED_OUTPATIENT_CLINIC_OR_DEPARTMENT_OTHER)
Admission: RE | Admit: 2017-10-17 | Discharge: 2017-10-17 | Disposition: A | Discharge status: Home / Self Care | Payer: Medicare Other | Source: Ambulatory Visit | Attending: Cardiovascular Disease | Admitting: Cardiovascular Disease

## 2017-10-17 ENCOUNTER — Encounter (HOSPITAL_COMMUNITY)
Admission: RE | Disposition: A | Discharge status: Home / Self Care | Payer: Self-pay | Source: Ambulatory Visit | Attending: Cardiovascular Disease

## 2017-10-17 DIAGNOSIS — E1122 Type 2 diabetes mellitus with diabetic chronic kidney disease: Secondary | ICD-10-CM | POA: Insufficient documentation

## 2017-10-17 DIAGNOSIS — E669 Obesity, unspecified: Secondary | ICD-10-CM | POA: Insufficient documentation

## 2017-10-17 DIAGNOSIS — I255 Ischemic cardiomyopathy: Secondary | ICD-10-CM | POA: Insufficient documentation

## 2017-10-17 DIAGNOSIS — Z6839 Body mass index (BMI) 39.0-39.9, adult: Secondary | ICD-10-CM | POA: Diagnosis not present

## 2017-10-17 DIAGNOSIS — N186 End stage renal disease: Secondary | ICD-10-CM | POA: Diagnosis not present

## 2017-10-17 DIAGNOSIS — E1140 Type 2 diabetes mellitus with diabetic neuropathy, unspecified: Secondary | ICD-10-CM | POA: Insufficient documentation

## 2017-10-17 DIAGNOSIS — Z992 Dependence on renal dialysis: Secondary | ICD-10-CM | POA: Insufficient documentation

## 2017-10-17 DIAGNOSIS — D631 Anemia in chronic kidney disease: Secondary | ICD-10-CM | POA: Diagnosis not present

## 2017-10-17 DIAGNOSIS — I2510 Atherosclerotic heart disease of native coronary artery without angina pectoris: Secondary | ICD-10-CM | POA: Diagnosis not present

## 2017-10-17 DIAGNOSIS — I340 Nonrheumatic mitral (valve) insufficiency: Secondary | ICD-10-CM

## 2017-10-17 DIAGNOSIS — I428 Other cardiomyopathies: Secondary | ICD-10-CM | POA: Diagnosis not present

## 2017-10-17 DIAGNOSIS — N2581 Secondary hyperparathyroidism of renal origin: Secondary | ICD-10-CM | POA: Insufficient documentation

## 2017-10-17 DIAGNOSIS — E7800 Pure hypercholesterolemia, unspecified: Secondary | ICD-10-CM | POA: Insufficient documentation

## 2017-10-17 DIAGNOSIS — I749 Embolism and thrombosis of unspecified artery: Secondary | ICD-10-CM | POA: Insufficient documentation

## 2017-10-17 DIAGNOSIS — Z7982 Long term (current) use of aspirin: Secondary | ICD-10-CM | POA: Diagnosis not present

## 2017-10-17 DIAGNOSIS — Z794 Long term (current) use of insulin: Secondary | ICD-10-CM | POA: Diagnosis not present

## 2017-10-17 DIAGNOSIS — I5042 Chronic combined systolic (congestive) and diastolic (congestive) heart failure: Secondary | ICD-10-CM | POA: Diagnosis not present

## 2017-10-17 DIAGNOSIS — I742 Embolism and thrombosis of arteries of the upper extremities: Secondary | ICD-10-CM

## 2017-10-17 DIAGNOSIS — I132 Hypertensive heart and chronic kidney disease with heart failure and with stage 5 chronic kidney disease, or end stage renal disease: Secondary | ICD-10-CM | POA: Diagnosis not present

## 2017-10-17 HISTORY — PX: TEE WITHOUT CARDIOVERSION: SHX5443

## 2017-10-17 SURGERY — ECHOCARDIOGRAM, TRANSESOPHAGEAL
Anesthesia: Moderate Sedation

## 2017-10-17 MED ORDER — FENTANYL CITRATE (PF) 100 MCG/2ML IJ SOLN
INTRAMUSCULAR | Status: AC
  Filled 2017-10-17: qty 2

## 2017-10-17 MED ORDER — DIPHENHYDRAMINE HCL 50 MG/ML IJ SOLN
INTRAMUSCULAR | Status: DC | PRN
Start: 2017-10-17 — End: 2017-10-17
  Administered 2017-10-17: 50 mg via INTRAVENOUS

## 2017-10-17 MED ORDER — MIDAZOLAM HCL 10 MG/2ML IJ SOLN
INTRAMUSCULAR | Status: DC | PRN
  Administered 2017-10-17: 1 mg via INTRAVENOUS
  Administered 2017-10-17 (×2): 2 mg via INTRAVENOUS

## 2017-10-17 MED ORDER — MIDAZOLAM HCL 5 MG/ML IJ SOLN
INTRAMUSCULAR | Status: AC
  Filled 2017-10-17: qty 2

## 2017-10-17 MED ORDER — BUTAMBEN-TETRACAINE-BENZOCAINE 2-2-14 % EX AERO
INHALATION_SPRAY | CUTANEOUS | Status: DC | PRN
  Administered 2017-10-17: 2 via TOPICAL

## 2017-10-17 MED ORDER — FENTANYL CITRATE (PF) 100 MCG/2ML IJ SOLN
INTRAMUSCULAR | Status: DC | PRN
  Administered 2017-10-17 (×2): 25 ug via INTRAVENOUS

## 2017-10-17 NOTE — Discharge Instructions (Signed)

## 2017-10-17 NOTE — Interval H&P Note (Signed)
History and Physical Interval Note:  10/17/2017 9:14 AM  [name]  has presented today for surgery, with the diagnosis of SYSTIMIC EMLOLI  The various methods of treatment have been discussed with the patient and family. After consideration of risks, benefits and other options for treatment, the patient has consented to  Procedure(s): TRANSESOPHAGEAL ECHOCARDIOGRAM (TEE) (N/A) as a surgical intervention .  The patient's history has been reviewed, patient examined, no change in status, stable for surgery.  I have reviewed the patient's chart and labs.  Questions were answered to the patient's satisfaction.     [name] [name]

## 2017-10-17 NOTE — CV Procedure (Signed)
During this procedure the patient is administered a total of Versed 5 mg and Fentanyl 50 mg and 50 mg Benedryl to achieve and maintain moderate conscious sedation.  The patient's heart rate, blood pressure, and oxygen saturation are monitored continuously during the procedure. The period of conscious sedation is 30 minutes, of which I was present face-to-face 100% of this time.  3D rendering of the mitral valve and atrial septum performed  EF 65% No LAA thrombus No ASD/PFO negative bubble No mural apical thrombus No source of embolus No significant aortic debris Mild MR Mild TR Mild AV sclerosis No marantic endocarditis   Jenkins Rouge

## 2017-10-18 ENCOUNTER — Encounter (HOSPITAL_COMMUNITY): Payer: Self-pay | Admitting: Cardiovascular Disease

## 2017-10-18 DIAGNOSIS — E785 Hyperlipidemia, unspecified: Secondary | ICD-10-CM | POA: Diagnosis not present

## 2017-10-18 DIAGNOSIS — N186 End stage renal disease: Secondary | ICD-10-CM | POA: Diagnosis not present

## 2017-10-18 DIAGNOSIS — N2581 Secondary hyperparathyroidism of renal origin: Secondary | ICD-10-CM | POA: Diagnosis not present

## 2017-10-18 DIAGNOSIS — D631 Anemia in chronic kidney disease: Secondary | ICD-10-CM | POA: Diagnosis not present

## 2017-10-19 ENCOUNTER — Ambulatory Visit (HOSPITAL_COMMUNITY)
Admission: RE | Admit: 2017-10-19 | Discharge: 2017-10-19 | Disposition: A | Discharge status: Home / Self Care | Payer: Medicare Other | Source: Ambulatory Visit | Attending: Cardiovascular Disease | Admitting: Cardiovascular Disease

## 2017-10-19 DIAGNOSIS — Z992 Dependence on renal dialysis: Secondary | ICD-10-CM | POA: Diagnosis not present

## 2017-10-19 DIAGNOSIS — I749 Embolism and thrombosis of unspecified artery: Secondary | ICD-10-CM | POA: Diagnosis not present

## 2017-10-19 DIAGNOSIS — N186 End stage renal disease: Secondary | ICD-10-CM | POA: Diagnosis not present

## 2017-10-20 DIAGNOSIS — D631 Anemia in chronic kidney disease: Secondary | ICD-10-CM | POA: Diagnosis not present

## 2017-10-20 DIAGNOSIS — N2581 Secondary hyperparathyroidism of renal origin: Secondary | ICD-10-CM | POA: Diagnosis not present

## 2017-10-20 DIAGNOSIS — N186 End stage renal disease: Secondary | ICD-10-CM | POA: Diagnosis not present

## 2017-10-22 DIAGNOSIS — D631 Anemia in chronic kidney disease: Secondary | ICD-10-CM | POA: Diagnosis not present

## 2017-10-22 DIAGNOSIS — N2581 Secondary hyperparathyroidism of renal origin: Secondary | ICD-10-CM | POA: Diagnosis not present

## 2017-10-22 DIAGNOSIS — N186 End stage renal disease: Secondary | ICD-10-CM | POA: Diagnosis not present

## 2017-10-25 DIAGNOSIS — N2581 Secondary hyperparathyroidism of renal origin: Secondary | ICD-10-CM | POA: Diagnosis not present

## 2017-10-25 DIAGNOSIS — L98499 Non-pressure chronic ulcer of skin of other sites with unspecified severity: Secondary | ICD-10-CM | POA: Diagnosis not present

## 2017-10-25 DIAGNOSIS — D631 Anemia in chronic kidney disease: Secondary | ICD-10-CM | POA: Diagnosis not present

## 2017-10-25 DIAGNOSIS — I445 Left posterior fascicular block: Secondary | ICD-10-CM | POA: Diagnosis not present

## 2017-10-25 DIAGNOSIS — I120 Hypertensive chronic kidney disease with stage 5 chronic kidney disease or end stage renal disease: Secondary | ICD-10-CM | POA: Diagnosis not present

## 2017-10-25 DIAGNOSIS — Z7982 Long term (current) use of aspirin: Secondary | ICD-10-CM | POA: Diagnosis not present

## 2017-10-25 DIAGNOSIS — Z794 Long term (current) use of insulin: Secondary | ICD-10-CM | POA: Diagnosis not present

## 2017-10-25 DIAGNOSIS — Z79899 Other long term (current) drug therapy: Secondary | ICD-10-CM | POA: Diagnosis not present

## 2017-10-25 DIAGNOSIS — R000 Tachycardia, unspecified: Secondary | ICD-10-CM | POA: Diagnosis not present

## 2017-10-25 DIAGNOSIS — L98491 Non-pressure chronic ulcer of skin of other sites limited to breakdown of skin: Secondary | ICD-10-CM | POA: Diagnosis not present

## 2017-10-25 DIAGNOSIS — I132 Hypertensive heart and chronic kidney disease with heart failure and with stage 5 chronic kidney disease, or end stage renal disease: Secondary | ICD-10-CM | POA: Diagnosis not present

## 2017-10-25 DIAGNOSIS — Z5181 Encounter for therapeutic drug level monitoring: Secondary | ICD-10-CM | POA: Diagnosis not present

## 2017-10-25 DIAGNOSIS — Z940 Kidney transplant status: Secondary | ICD-10-CM | POA: Diagnosis not present

## 2017-10-25 DIAGNOSIS — Z992 Dependence on renal dialysis: Secondary | ICD-10-CM | POA: Diagnosis not present

## 2017-10-25 DIAGNOSIS — N186 End stage renal disease: Secondary | ICD-10-CM | POA: Diagnosis not present

## 2017-10-27 DIAGNOSIS — M159 Polyosteoarthritis, unspecified: Secondary | ICD-10-CM | POA: Diagnosis not present

## 2017-10-27 DIAGNOSIS — L905 Scar conditions and fibrosis of skin: Secondary | ICD-10-CM | POA: Diagnosis not present

## 2017-10-27 DIAGNOSIS — E7800 Pure hypercholesterolemia, unspecified: Secondary | ICD-10-CM | POA: Diagnosis not present

## 2017-10-27 DIAGNOSIS — Z79899 Other long term (current) drug therapy: Secondary | ICD-10-CM | POA: Diagnosis not present

## 2017-10-27 DIAGNOSIS — N2581 Secondary hyperparathyroidism of renal origin: Secondary | ICD-10-CM | POA: Diagnosis not present

## 2017-10-27 DIAGNOSIS — E875 Hyperkalemia: Secondary | ICD-10-CM | POA: Diagnosis not present

## 2017-10-27 DIAGNOSIS — I998 Other disorder of circulatory system: Secondary | ICD-10-CM | POA: Diagnosis not present

## 2017-10-27 DIAGNOSIS — E119 Type 2 diabetes mellitus without complications: Secondary | ICD-10-CM | POA: Diagnosis not present

## 2017-10-27 DIAGNOSIS — I739 Peripheral vascular disease, unspecified: Secondary | ICD-10-CM | POA: Diagnosis not present

## 2017-10-27 DIAGNOSIS — R55 Syncope and collapse: Secondary | ICD-10-CM | POA: Diagnosis not present

## 2017-10-27 DIAGNOSIS — Z79891 Long term (current) use of opiate analgesic: Secondary | ICD-10-CM | POA: Diagnosis not present

## 2017-10-27 DIAGNOSIS — D631 Anemia in chronic kidney disease: Secondary | ICD-10-CM | POA: Diagnosis not present

## 2017-10-27 DIAGNOSIS — I5032 Chronic diastolic (congestive) heart failure: Secondary | ICD-10-CM | POA: Diagnosis not present

## 2017-10-27 DIAGNOSIS — E1144 Type 2 diabetes mellitus with diabetic amyotrophy: Secondary | ICD-10-CM | POA: Diagnosis not present

## 2017-10-27 DIAGNOSIS — N186 End stage renal disease: Secondary | ICD-10-CM | POA: Diagnosis not present

## 2017-10-28 DIAGNOSIS — I96 Gangrene, not elsewhere classified: Secondary | ICD-10-CM | POA: Diagnosis not present

## 2017-10-28 DIAGNOSIS — I998 Other disorder of circulatory system: Secondary | ICD-10-CM | POA: Diagnosis not present

## 2017-10-29 DIAGNOSIS — N186 End stage renal disease: Secondary | ICD-10-CM | POA: Diagnosis not present

## 2017-10-29 DIAGNOSIS — D631 Anemia in chronic kidney disease: Secondary | ICD-10-CM | POA: Diagnosis not present

## 2017-10-29 DIAGNOSIS — N2581 Secondary hyperparathyroidism of renal origin: Secondary | ICD-10-CM | POA: Diagnosis not present

## 2017-11-01 DIAGNOSIS — N2581 Secondary hyperparathyroidism of renal origin: Secondary | ICD-10-CM | POA: Diagnosis not present

## 2017-11-01 DIAGNOSIS — D631 Anemia in chronic kidney disease: Secondary | ICD-10-CM | POA: Diagnosis not present

## 2017-11-01 DIAGNOSIS — N186 End stage renal disease: Secondary | ICD-10-CM | POA: Diagnosis not present

## 2017-11-03 ENCOUNTER — Other Ambulatory Visit: Payer: Self-pay | Admitting: *Deleted

## 2017-11-03 DIAGNOSIS — N2581 Secondary hyperparathyroidism of renal origin: Secondary | ICD-10-CM | POA: Diagnosis not present

## 2017-11-03 DIAGNOSIS — I776 Arteritis, unspecified: Secondary | ICD-10-CM

## 2017-11-03 DIAGNOSIS — D631 Anemia in chronic kidney disease: Secondary | ICD-10-CM | POA: Diagnosis not present

## 2017-11-03 DIAGNOSIS — I5022 Chronic systolic (congestive) heart failure: Secondary | ICD-10-CM

## 2017-11-03 DIAGNOSIS — N186 End stage renal disease: Secondary | ICD-10-CM | POA: Diagnosis not present

## 2017-11-03 DIAGNOSIS — I428 Other cardiomyopathies: Secondary | ICD-10-CM

## 2017-11-04 ENCOUNTER — Other Ambulatory Visit: Payer: Medicare Other | Admitting: *Deleted

## 2017-11-04 ENCOUNTER — Encounter (INDEPENDENT_AMBULATORY_CARE_PROVIDER_SITE_OTHER): Payer: Self-pay

## 2017-11-04 DIAGNOSIS — I5022 Chronic systolic (congestive) heart failure: Secondary | ICD-10-CM | POA: Diagnosis not present

## 2017-11-04 DIAGNOSIS — I428 Other cardiomyopathies: Secondary | ICD-10-CM | POA: Diagnosis not present

## 2017-11-04 DIAGNOSIS — I776 Arteritis, unspecified: Secondary | ICD-10-CM

## 2017-11-05 DIAGNOSIS — N2581 Secondary hyperparathyroidism of renal origin: Secondary | ICD-10-CM | POA: Diagnosis not present

## 2017-11-05 DIAGNOSIS — N186 End stage renal disease: Secondary | ICD-10-CM | POA: Diagnosis not present

## 2017-11-05 DIAGNOSIS — D631 Anemia in chronic kidney disease: Secondary | ICD-10-CM | POA: Diagnosis not present

## 2017-11-05 LAB — SEDIMENTATION RATE: Sed Rate: 116 mm/hr — ABNORMAL HIGH (ref 0–30)

## 2017-11-05 LAB — C-REACTIVE PROTEIN: CRP: 28 mg/L — ABNORMAL HIGH (ref 0–10)

## 2017-11-05 LAB — RHEUMATOID FACTOR: Rheumatoid fact SerPl-aCnc: 22.2 IU/mL — ABNORMAL HIGH (ref 0.0–13.9)

## 2017-11-05 LAB — ANA: Anti Nuclear Antibody(ANA): NEGATIVE

## 2017-11-08 DIAGNOSIS — N2581 Secondary hyperparathyroidism of renal origin: Secondary | ICD-10-CM | POA: Diagnosis not present

## 2017-11-08 DIAGNOSIS — D631 Anemia in chronic kidney disease: Secondary | ICD-10-CM | POA: Diagnosis not present

## 2017-11-08 DIAGNOSIS — N186 End stage renal disease: Secondary | ICD-10-CM | POA: Diagnosis not present

## 2017-11-09 DIAGNOSIS — N186 End stage renal disease: Secondary | ICD-10-CM | POA: Diagnosis not present

## 2017-11-09 DIAGNOSIS — I509 Heart failure, unspecified: Secondary | ICD-10-CM | POA: Diagnosis not present

## 2017-11-09 DIAGNOSIS — Z794 Long term (current) use of insulin: Secondary | ICD-10-CM | POA: Diagnosis not present

## 2017-11-09 DIAGNOSIS — Z992 Dependence on renal dialysis: Secondary | ICD-10-CM | POA: Diagnosis not present

## 2017-11-09 DIAGNOSIS — E1122 Type 2 diabetes mellitus with diabetic chronic kidney disease: Secondary | ICD-10-CM | POA: Diagnosis not present

## 2017-11-09 DIAGNOSIS — I70268 Atherosclerosis of native arteries of extremities with gangrene, other extremity: Secondary | ICD-10-CM | POA: Diagnosis not present

## 2017-11-09 DIAGNOSIS — I96 Gangrene, not elsewhere classified: Secondary | ICD-10-CM | POA: Diagnosis not present

## 2017-11-09 DIAGNOSIS — Z940 Kidney transplant status: Secondary | ICD-10-CM | POA: Diagnosis not present

## 2017-11-09 DIAGNOSIS — I132 Hypertensive heart and chronic kidney disease with heart failure and with stage 5 chronic kidney disease, or end stage renal disease: Secondary | ICD-10-CM | POA: Diagnosis not present

## 2017-11-10 DIAGNOSIS — N2581 Secondary hyperparathyroidism of renal origin: Secondary | ICD-10-CM | POA: Diagnosis not present

## 2017-11-10 DIAGNOSIS — D631 Anemia in chronic kidney disease: Secondary | ICD-10-CM | POA: Diagnosis not present

## 2017-11-10 DIAGNOSIS — N186 End stage renal disease: Secondary | ICD-10-CM | POA: Diagnosis not present

## 2017-11-12 DIAGNOSIS — N2581 Secondary hyperparathyroidism of renal origin: Secondary | ICD-10-CM | POA: Diagnosis not present

## 2017-11-12 DIAGNOSIS — D631 Anemia in chronic kidney disease: Secondary | ICD-10-CM | POA: Diagnosis not present

## 2017-11-12 DIAGNOSIS — N186 End stage renal disease: Secondary | ICD-10-CM | POA: Diagnosis not present

## 2017-11-15 ENCOUNTER — Telehealth: Payer: Self-pay | Admitting: Interventional Cardiology

## 2017-11-15 DIAGNOSIS — Z6841 Body Mass Index (BMI) 40.0 and over, adult: Secondary | ICD-10-CM | POA: Diagnosis not present

## 2017-11-15 DIAGNOSIS — I998 Other disorder of circulatory system: Secondary | ICD-10-CM | POA: Diagnosis not present

## 2017-11-15 DIAGNOSIS — D631 Anemia in chronic kidney disease: Secondary | ICD-10-CM | POA: Diagnosis not present

## 2017-11-15 DIAGNOSIS — N2581 Secondary hyperparathyroidism of renal origin: Secondary | ICD-10-CM | POA: Diagnosis not present

## 2017-11-15 DIAGNOSIS — N186 End stage renal disease: Secondary | ICD-10-CM | POA: Diagnosis not present

## 2017-11-15 NOTE — Telephone Encounter (Signed)
Informed pt of results. Pt verbalized understanding. 

## 2017-11-15 NOTE — Telephone Encounter (Signed)
New Message ° ° ° ° ° ° ° ° ° °Patient returned your call °

## 2017-11-17 DIAGNOSIS — N2581 Secondary hyperparathyroidism of renal origin: Secondary | ICD-10-CM | POA: Diagnosis not present

## 2017-11-17 DIAGNOSIS — D631 Anemia in chronic kidney disease: Secondary | ICD-10-CM | POA: Diagnosis not present

## 2017-11-17 DIAGNOSIS — N186 End stage renal disease: Secondary | ICD-10-CM | POA: Diagnosis not present

## 2017-11-19 DIAGNOSIS — N186 End stage renal disease: Secondary | ICD-10-CM | POA: Diagnosis not present

## 2017-11-19 DIAGNOSIS — N2581 Secondary hyperparathyroidism of renal origin: Secondary | ICD-10-CM | POA: Diagnosis not present

## 2017-11-19 DIAGNOSIS — Z992 Dependence on renal dialysis: Secondary | ICD-10-CM | POA: Diagnosis not present

## 2017-11-19 DIAGNOSIS — D631 Anemia in chronic kidney disease: Secondary | ICD-10-CM | POA: Diagnosis not present

## 2017-11-21 DIAGNOSIS — N186 End stage renal disease: Secondary | ICD-10-CM | POA: Diagnosis not present

## 2017-11-21 DIAGNOSIS — N2581 Secondary hyperparathyroidism of renal origin: Secondary | ICD-10-CM | POA: Diagnosis not present

## 2017-11-21 DIAGNOSIS — D631 Anemia in chronic kidney disease: Secondary | ICD-10-CM | POA: Diagnosis not present

## 2017-11-22 DIAGNOSIS — N2581 Secondary hyperparathyroidism of renal origin: Secondary | ICD-10-CM | POA: Diagnosis not present

## 2017-11-22 DIAGNOSIS — D631 Anemia in chronic kidney disease: Secondary | ICD-10-CM | POA: Diagnosis not present

## 2017-11-22 DIAGNOSIS — N186 End stage renal disease: Secondary | ICD-10-CM | POA: Diagnosis not present

## 2017-11-24 DIAGNOSIS — D631 Anemia in chronic kidney disease: Secondary | ICD-10-CM | POA: Diagnosis not present

## 2017-11-24 DIAGNOSIS — N2581 Secondary hyperparathyroidism of renal origin: Secondary | ICD-10-CM | POA: Diagnosis not present

## 2017-11-24 DIAGNOSIS — N186 End stage renal disease: Secondary | ICD-10-CM | POA: Diagnosis not present

## 2017-11-24 DIAGNOSIS — E119 Type 2 diabetes mellitus without complications: Secondary | ICD-10-CM | POA: Diagnosis not present

## 2017-11-26 DIAGNOSIS — N2581 Secondary hyperparathyroidism of renal origin: Secondary | ICD-10-CM | POA: Diagnosis not present

## 2017-11-26 DIAGNOSIS — D631 Anemia in chronic kidney disease: Secondary | ICD-10-CM | POA: Diagnosis not present

## 2017-11-26 DIAGNOSIS — N186 End stage renal disease: Secondary | ICD-10-CM | POA: Diagnosis not present

## 2017-11-29 DIAGNOSIS — N186 End stage renal disease: Secondary | ICD-10-CM | POA: Diagnosis not present

## 2017-11-29 DIAGNOSIS — N2581 Secondary hyperparathyroidism of renal origin: Secondary | ICD-10-CM | POA: Diagnosis not present

## 2017-11-29 DIAGNOSIS — D631 Anemia in chronic kidney disease: Secondary | ICD-10-CM | POA: Diagnosis not present

## 2017-12-01 DIAGNOSIS — D631 Anemia in chronic kidney disease: Secondary | ICD-10-CM | POA: Diagnosis not present

## 2017-12-01 DIAGNOSIS — N186 End stage renal disease: Secondary | ICD-10-CM | POA: Diagnosis not present

## 2017-12-01 DIAGNOSIS — N2581 Secondary hyperparathyroidism of renal origin: Secondary | ICD-10-CM | POA: Diagnosis not present

## 2017-12-03 DIAGNOSIS — N2581 Secondary hyperparathyroidism of renal origin: Secondary | ICD-10-CM | POA: Diagnosis not present

## 2017-12-03 DIAGNOSIS — D631 Anemia in chronic kidney disease: Secondary | ICD-10-CM | POA: Diagnosis not present

## 2017-12-03 DIAGNOSIS — N186 End stage renal disease: Secondary | ICD-10-CM | POA: Diagnosis not present

## 2017-12-05 DIAGNOSIS — M79644 Pain in right finger(s): Secondary | ICD-10-CM | POA: Diagnosis not present

## 2017-12-05 DIAGNOSIS — Z6841 Body Mass Index (BMI) 40.0 and over, adult: Secondary | ICD-10-CM | POA: Diagnosis not present

## 2017-12-06 DIAGNOSIS — N186 End stage renal disease: Secondary | ICD-10-CM | POA: Diagnosis not present

## 2017-12-06 DIAGNOSIS — N2581 Secondary hyperparathyroidism of renal origin: Secondary | ICD-10-CM | POA: Diagnosis not present

## 2017-12-06 DIAGNOSIS — D631 Anemia in chronic kidney disease: Secondary | ICD-10-CM | POA: Diagnosis not present

## 2017-12-07 DIAGNOSIS — N186 End stage renal disease: Secondary | ICD-10-CM | POA: Diagnosis not present

## 2017-12-07 DIAGNOSIS — I708 Atherosclerosis of other arteries: Secondary | ICD-10-CM | POA: Diagnosis not present

## 2017-12-07 DIAGNOSIS — I96 Gangrene, not elsewhere classified: Secondary | ICD-10-CM | POA: Diagnosis not present

## 2017-12-07 DIAGNOSIS — E1122 Type 2 diabetes mellitus with diabetic chronic kidney disease: Secondary | ICD-10-CM | POA: Diagnosis not present

## 2017-12-07 DIAGNOSIS — Z992 Dependence on renal dialysis: Secondary | ICD-10-CM | POA: Diagnosis not present

## 2017-12-07 DIAGNOSIS — I998 Other disorder of circulatory system: Secondary | ICD-10-CM | POA: Diagnosis not present

## 2017-12-07 DIAGNOSIS — I120 Hypertensive chronic kidney disease with stage 5 chronic kidney disease or end stage renal disease: Secondary | ICD-10-CM | POA: Diagnosis not present

## 2017-12-07 DIAGNOSIS — I70208 Unspecified atherosclerosis of native arteries of extremities, other extremity: Secondary | ICD-10-CM | POA: Diagnosis not present

## 2017-12-07 DIAGNOSIS — I70298 Other atherosclerosis of native arteries of extremities, other extremity: Secondary | ICD-10-CM | POA: Diagnosis not present

## 2017-12-08 DIAGNOSIS — I708 Atherosclerosis of other arteries: Secondary | ICD-10-CM | POA: Diagnosis not present

## 2017-12-08 DIAGNOSIS — E1122 Type 2 diabetes mellitus with diabetic chronic kidney disease: Secondary | ICD-10-CM | POA: Diagnosis not present

## 2017-12-08 DIAGNOSIS — N186 End stage renal disease: Secondary | ICD-10-CM | POA: Diagnosis not present

## 2017-12-08 DIAGNOSIS — Z992 Dependence on renal dialysis: Secondary | ICD-10-CM | POA: Diagnosis not present

## 2017-12-08 DIAGNOSIS — I70208 Unspecified atherosclerosis of native arteries of extremities, other extremity: Secondary | ICD-10-CM | POA: Diagnosis not present

## 2017-12-08 DIAGNOSIS — I120 Hypertensive chronic kidney disease with stage 5 chronic kidney disease or end stage renal disease: Secondary | ICD-10-CM | POA: Diagnosis not present

## 2017-12-10 DIAGNOSIS — D631 Anemia in chronic kidney disease: Secondary | ICD-10-CM | POA: Diagnosis not present

## 2017-12-10 DIAGNOSIS — N186 End stage renal disease: Secondary | ICD-10-CM | POA: Diagnosis not present

## 2017-12-10 DIAGNOSIS — N2581 Secondary hyperparathyroidism of renal origin: Secondary | ICD-10-CM | POA: Diagnosis not present

## 2017-12-13 DIAGNOSIS — D631 Anemia in chronic kidney disease: Secondary | ICD-10-CM | POA: Diagnosis not present

## 2017-12-13 DIAGNOSIS — N186 End stage renal disease: Secondary | ICD-10-CM | POA: Diagnosis not present

## 2017-12-13 DIAGNOSIS — N2581 Secondary hyperparathyroidism of renal origin: Secondary | ICD-10-CM | POA: Diagnosis not present

## 2017-12-15 DIAGNOSIS — N2581 Secondary hyperparathyroidism of renal origin: Secondary | ICD-10-CM | POA: Diagnosis not present

## 2017-12-15 DIAGNOSIS — D631 Anemia in chronic kidney disease: Secondary | ICD-10-CM | POA: Diagnosis not present

## 2017-12-15 DIAGNOSIS — N186 End stage renal disease: Secondary | ICD-10-CM | POA: Diagnosis not present

## 2017-12-17 DIAGNOSIS — N186 End stage renal disease: Secondary | ICD-10-CM | POA: Diagnosis not present

## 2017-12-17 DIAGNOSIS — N2581 Secondary hyperparathyroidism of renal origin: Secondary | ICD-10-CM | POA: Diagnosis not present

## 2017-12-17 DIAGNOSIS — D631 Anemia in chronic kidney disease: Secondary | ICD-10-CM | POA: Diagnosis not present

## 2017-12-19 DIAGNOSIS — N186 End stage renal disease: Secondary | ICD-10-CM | POA: Diagnosis not present

## 2017-12-19 DIAGNOSIS — Z992 Dependence on renal dialysis: Secondary | ICD-10-CM | POA: Diagnosis not present

## 2017-12-20 DIAGNOSIS — D631 Anemia in chronic kidney disease: Secondary | ICD-10-CM | POA: Diagnosis not present

## 2017-12-20 DIAGNOSIS — Z418 Encounter for other procedures for purposes other than remedying health state: Secondary | ICD-10-CM | POA: Diagnosis not present

## 2017-12-20 DIAGNOSIS — N186 End stage renal disease: Secondary | ICD-10-CM | POA: Diagnosis not present

## 2017-12-20 DIAGNOSIS — N2581 Secondary hyperparathyroidism of renal origin: Secondary | ICD-10-CM | POA: Diagnosis not present

## 2017-12-20 DIAGNOSIS — L089 Local infection of the skin and subcutaneous tissue, unspecified: Secondary | ICD-10-CM | POA: Diagnosis not present

## 2017-12-20 DIAGNOSIS — E785 Hyperlipidemia, unspecified: Secondary | ICD-10-CM | POA: Diagnosis not present

## 2017-12-22 DIAGNOSIS — L089 Local infection of the skin and subcutaneous tissue, unspecified: Secondary | ICD-10-CM | POA: Diagnosis not present

## 2017-12-22 DIAGNOSIS — E119 Type 2 diabetes mellitus without complications: Secondary | ICD-10-CM | POA: Diagnosis not present

## 2017-12-22 DIAGNOSIS — N2581 Secondary hyperparathyroidism of renal origin: Secondary | ICD-10-CM | POA: Diagnosis not present

## 2017-12-22 DIAGNOSIS — D631 Anemia in chronic kidney disease: Secondary | ICD-10-CM | POA: Diagnosis not present

## 2017-12-22 DIAGNOSIS — N186 End stage renal disease: Secondary | ICD-10-CM | POA: Diagnosis not present

## 2017-12-22 DIAGNOSIS — Z418 Encounter for other procedures for purposes other than remedying health state: Secondary | ICD-10-CM | POA: Diagnosis not present

## 2017-12-22 DIAGNOSIS — E785 Hyperlipidemia, unspecified: Secondary | ICD-10-CM | POA: Diagnosis not present

## 2017-12-24 DIAGNOSIS — D631 Anemia in chronic kidney disease: Secondary | ICD-10-CM | POA: Diagnosis not present

## 2017-12-24 DIAGNOSIS — N186 End stage renal disease: Secondary | ICD-10-CM | POA: Diagnosis not present

## 2017-12-24 DIAGNOSIS — L089 Local infection of the skin and subcutaneous tissue, unspecified: Secondary | ICD-10-CM | POA: Diagnosis not present

## 2017-12-24 DIAGNOSIS — N2581 Secondary hyperparathyroidism of renal origin: Secondary | ICD-10-CM | POA: Diagnosis not present

## 2017-12-24 DIAGNOSIS — E785 Hyperlipidemia, unspecified: Secondary | ICD-10-CM | POA: Diagnosis not present

## 2017-12-24 DIAGNOSIS — Z418 Encounter for other procedures for purposes other than remedying health state: Secondary | ICD-10-CM | POA: Diagnosis not present

## 2017-12-27 DIAGNOSIS — E785 Hyperlipidemia, unspecified: Secondary | ICD-10-CM | POA: Diagnosis not present

## 2017-12-27 DIAGNOSIS — Z418 Encounter for other procedures for purposes other than remedying health state: Secondary | ICD-10-CM | POA: Diagnosis not present

## 2017-12-27 DIAGNOSIS — N186 End stage renal disease: Secondary | ICD-10-CM | POA: Diagnosis not present

## 2017-12-27 DIAGNOSIS — D631 Anemia in chronic kidney disease: Secondary | ICD-10-CM | POA: Diagnosis not present

## 2017-12-27 DIAGNOSIS — N2581 Secondary hyperparathyroidism of renal origin: Secondary | ICD-10-CM | POA: Diagnosis not present

## 2017-12-27 DIAGNOSIS — L089 Local infection of the skin and subcutaneous tissue, unspecified: Secondary | ICD-10-CM | POA: Diagnosis not present

## 2017-12-29 DIAGNOSIS — D631 Anemia in chronic kidney disease: Secondary | ICD-10-CM | POA: Diagnosis not present

## 2017-12-29 DIAGNOSIS — E785 Hyperlipidemia, unspecified: Secondary | ICD-10-CM | POA: Diagnosis not present

## 2017-12-29 DIAGNOSIS — N186 End stage renal disease: Secondary | ICD-10-CM | POA: Diagnosis not present

## 2017-12-29 DIAGNOSIS — N2581 Secondary hyperparathyroidism of renal origin: Secondary | ICD-10-CM | POA: Diagnosis not present

## 2017-12-29 DIAGNOSIS — Z418 Encounter for other procedures for purposes other than remedying health state: Secondary | ICD-10-CM | POA: Diagnosis not present

## 2017-12-29 DIAGNOSIS — L089 Local infection of the skin and subcutaneous tissue, unspecified: Secondary | ICD-10-CM | POA: Diagnosis not present

## 2017-12-31 DIAGNOSIS — N186 End stage renal disease: Secondary | ICD-10-CM | POA: Diagnosis not present

## 2017-12-31 DIAGNOSIS — L089 Local infection of the skin and subcutaneous tissue, unspecified: Secondary | ICD-10-CM | POA: Diagnosis not present

## 2017-12-31 DIAGNOSIS — Z418 Encounter for other procedures for purposes other than remedying health state: Secondary | ICD-10-CM | POA: Diagnosis not present

## 2017-12-31 DIAGNOSIS — E785 Hyperlipidemia, unspecified: Secondary | ICD-10-CM | POA: Diagnosis not present

## 2017-12-31 DIAGNOSIS — N2581 Secondary hyperparathyroidism of renal origin: Secondary | ICD-10-CM | POA: Diagnosis not present

## 2017-12-31 DIAGNOSIS — D631 Anemia in chronic kidney disease: Secondary | ICD-10-CM | POA: Diagnosis not present

## 2018-01-03 DIAGNOSIS — N2581 Secondary hyperparathyroidism of renal origin: Secondary | ICD-10-CM | POA: Diagnosis not present

## 2018-01-03 DIAGNOSIS — E785 Hyperlipidemia, unspecified: Secondary | ICD-10-CM | POA: Diagnosis not present

## 2018-01-03 DIAGNOSIS — L089 Local infection of the skin and subcutaneous tissue, unspecified: Secondary | ICD-10-CM | POA: Diagnosis not present

## 2018-01-03 DIAGNOSIS — Z418 Encounter for other procedures for purposes other than remedying health state: Secondary | ICD-10-CM | POA: Diagnosis not present

## 2018-01-03 DIAGNOSIS — N186 End stage renal disease: Secondary | ICD-10-CM | POA: Diagnosis not present

## 2018-01-03 DIAGNOSIS — D631 Anemia in chronic kidney disease: Secondary | ICD-10-CM | POA: Diagnosis not present

## 2018-01-04 DIAGNOSIS — Z7982 Long term (current) use of aspirin: Secondary | ICD-10-CM | POA: Diagnosis not present

## 2018-01-04 DIAGNOSIS — Z6841 Body Mass Index (BMI) 40.0 and over, adult: Secondary | ICD-10-CM | POA: Diagnosis not present

## 2018-01-04 DIAGNOSIS — E669 Obesity, unspecified: Secondary | ICD-10-CM | POA: Diagnosis not present

## 2018-01-04 DIAGNOSIS — M318 Other specified necrotizing vasculopathies: Secondary | ICD-10-CM | POA: Diagnosis not present

## 2018-01-04 DIAGNOSIS — I428 Other cardiomyopathies: Secondary | ICD-10-CM | POA: Diagnosis not present

## 2018-01-04 DIAGNOSIS — T8612 Kidney transplant failure: Secondary | ICD-10-CM | POA: Diagnosis not present

## 2018-01-04 DIAGNOSIS — I5032 Chronic diastolic (congestive) heart failure: Secondary | ICD-10-CM | POA: Diagnosis not present

## 2018-01-04 DIAGNOSIS — Z992 Dependence on renal dialysis: Secondary | ICD-10-CM | POA: Diagnosis not present

## 2018-01-04 DIAGNOSIS — D649 Anemia, unspecified: Secondary | ICD-10-CM | POA: Diagnosis not present

## 2018-01-04 DIAGNOSIS — I132 Hypertensive heart and chronic kidney disease with heart failure and with stage 5 chronic kidney disease, or end stage renal disease: Secondary | ICD-10-CM | POA: Diagnosis not present

## 2018-01-04 DIAGNOSIS — E1152 Type 2 diabetes mellitus with diabetic peripheral angiopathy with gangrene: Secondary | ICD-10-CM | POA: Diagnosis not present

## 2018-01-04 DIAGNOSIS — N186 End stage renal disease: Secondary | ICD-10-CM | POA: Diagnosis not present

## 2018-01-04 DIAGNOSIS — I96 Gangrene, not elsewhere classified: Secondary | ICD-10-CM | POA: Diagnosis not present

## 2018-01-04 DIAGNOSIS — Z794 Long term (current) use of insulin: Secondary | ICD-10-CM | POA: Diagnosis not present

## 2018-01-04 DIAGNOSIS — Z79899 Other long term (current) drug therapy: Secondary | ICD-10-CM | POA: Diagnosis not present

## 2018-01-04 DIAGNOSIS — E1122 Type 2 diabetes mellitus with diabetic chronic kidney disease: Secondary | ICD-10-CM | POA: Diagnosis not present

## 2018-01-05 DIAGNOSIS — Z418 Encounter for other procedures for purposes other than remedying health state: Secondary | ICD-10-CM | POA: Diagnosis not present

## 2018-01-05 DIAGNOSIS — N186 End stage renal disease: Secondary | ICD-10-CM | POA: Diagnosis not present

## 2018-01-05 DIAGNOSIS — D631 Anemia in chronic kidney disease: Secondary | ICD-10-CM | POA: Diagnosis not present

## 2018-01-05 DIAGNOSIS — L089 Local infection of the skin and subcutaneous tissue, unspecified: Secondary | ICD-10-CM | POA: Diagnosis not present

## 2018-01-05 DIAGNOSIS — N2581 Secondary hyperparathyroidism of renal origin: Secondary | ICD-10-CM | POA: Diagnosis not present

## 2018-01-05 DIAGNOSIS — E785 Hyperlipidemia, unspecified: Secondary | ICD-10-CM | POA: Diagnosis not present

## 2018-01-07 DIAGNOSIS — L089 Local infection of the skin and subcutaneous tissue, unspecified: Secondary | ICD-10-CM | POA: Diagnosis not present

## 2018-01-07 DIAGNOSIS — N186 End stage renal disease: Secondary | ICD-10-CM | POA: Diagnosis not present

## 2018-01-07 DIAGNOSIS — Z418 Encounter for other procedures for purposes other than remedying health state: Secondary | ICD-10-CM | POA: Diagnosis not present

## 2018-01-07 DIAGNOSIS — N2581 Secondary hyperparathyroidism of renal origin: Secondary | ICD-10-CM | POA: Diagnosis not present

## 2018-01-07 DIAGNOSIS — E785 Hyperlipidemia, unspecified: Secondary | ICD-10-CM | POA: Diagnosis not present

## 2018-01-07 DIAGNOSIS — D631 Anemia in chronic kidney disease: Secondary | ICD-10-CM | POA: Diagnosis not present

## 2018-01-10 DIAGNOSIS — Z418 Encounter for other procedures for purposes other than remedying health state: Secondary | ICD-10-CM | POA: Diagnosis not present

## 2018-01-10 DIAGNOSIS — N2581 Secondary hyperparathyroidism of renal origin: Secondary | ICD-10-CM | POA: Diagnosis not present

## 2018-01-10 DIAGNOSIS — N186 End stage renal disease: Secondary | ICD-10-CM | POA: Diagnosis not present

## 2018-01-10 DIAGNOSIS — L089 Local infection of the skin and subcutaneous tissue, unspecified: Secondary | ICD-10-CM | POA: Diagnosis not present

## 2018-01-10 DIAGNOSIS — E785 Hyperlipidemia, unspecified: Secondary | ICD-10-CM | POA: Diagnosis not present

## 2018-01-10 DIAGNOSIS — D631 Anemia in chronic kidney disease: Secondary | ICD-10-CM | POA: Diagnosis not present

## 2018-01-12 DIAGNOSIS — Z418 Encounter for other procedures for purposes other than remedying health state: Secondary | ICD-10-CM | POA: Diagnosis not present

## 2018-01-12 DIAGNOSIS — N2581 Secondary hyperparathyroidism of renal origin: Secondary | ICD-10-CM | POA: Diagnosis not present

## 2018-01-12 DIAGNOSIS — E785 Hyperlipidemia, unspecified: Secondary | ICD-10-CM | POA: Diagnosis not present

## 2018-01-12 DIAGNOSIS — N186 End stage renal disease: Secondary | ICD-10-CM | POA: Diagnosis not present

## 2018-01-12 DIAGNOSIS — D631 Anemia in chronic kidney disease: Secondary | ICD-10-CM | POA: Diagnosis not present

## 2018-01-12 DIAGNOSIS — L089 Local infection of the skin and subcutaneous tissue, unspecified: Secondary | ICD-10-CM | POA: Diagnosis not present

## 2018-01-14 DIAGNOSIS — L089 Local infection of the skin and subcutaneous tissue, unspecified: Secondary | ICD-10-CM | POA: Diagnosis not present

## 2018-01-14 DIAGNOSIS — D631 Anemia in chronic kidney disease: Secondary | ICD-10-CM | POA: Diagnosis not present

## 2018-01-14 DIAGNOSIS — N186 End stage renal disease: Secondary | ICD-10-CM | POA: Diagnosis not present

## 2018-01-14 DIAGNOSIS — N2581 Secondary hyperparathyroidism of renal origin: Secondary | ICD-10-CM | POA: Diagnosis not present

## 2018-01-14 DIAGNOSIS — E785 Hyperlipidemia, unspecified: Secondary | ICD-10-CM | POA: Diagnosis not present

## 2018-01-14 DIAGNOSIS — Z418 Encounter for other procedures for purposes other than remedying health state: Secondary | ICD-10-CM | POA: Diagnosis not present

## 2018-01-17 DIAGNOSIS — D631 Anemia in chronic kidney disease: Secondary | ICD-10-CM | POA: Diagnosis not present

## 2018-01-17 DIAGNOSIS — E785 Hyperlipidemia, unspecified: Secondary | ICD-10-CM | POA: Diagnosis not present

## 2018-01-17 DIAGNOSIS — Z418 Encounter for other procedures for purposes other than remedying health state: Secondary | ICD-10-CM | POA: Diagnosis not present

## 2018-01-17 DIAGNOSIS — L089 Local infection of the skin and subcutaneous tissue, unspecified: Secondary | ICD-10-CM | POA: Diagnosis not present

## 2018-01-17 DIAGNOSIS — N2581 Secondary hyperparathyroidism of renal origin: Secondary | ICD-10-CM | POA: Diagnosis not present

## 2018-01-17 DIAGNOSIS — N186 End stage renal disease: Secondary | ICD-10-CM | POA: Diagnosis not present

## 2018-01-19 DIAGNOSIS — Z992 Dependence on renal dialysis: Secondary | ICD-10-CM | POA: Diagnosis not present

## 2018-01-19 DIAGNOSIS — D631 Anemia in chronic kidney disease: Secondary | ICD-10-CM | POA: Diagnosis not present

## 2018-01-19 DIAGNOSIS — E785 Hyperlipidemia, unspecified: Secondary | ICD-10-CM | POA: Diagnosis not present

## 2018-01-19 DIAGNOSIS — N2581 Secondary hyperparathyroidism of renal origin: Secondary | ICD-10-CM | POA: Diagnosis not present

## 2018-01-19 DIAGNOSIS — Z418 Encounter for other procedures for purposes other than remedying health state: Secondary | ICD-10-CM | POA: Diagnosis not present

## 2018-01-19 DIAGNOSIS — L089 Local infection of the skin and subcutaneous tissue, unspecified: Secondary | ICD-10-CM | POA: Diagnosis not present

## 2018-01-19 DIAGNOSIS — N186 End stage renal disease: Secondary | ICD-10-CM | POA: Diagnosis not present

## 2018-01-20 DIAGNOSIS — Z418 Encounter for other procedures for purposes other than remedying health state: Secondary | ICD-10-CM | POA: Diagnosis not present

## 2018-01-20 DIAGNOSIS — L089 Local infection of the skin and subcutaneous tissue, unspecified: Secondary | ICD-10-CM | POA: Diagnosis not present

## 2018-01-20 DIAGNOSIS — D631 Anemia in chronic kidney disease: Secondary | ICD-10-CM | POA: Diagnosis not present

## 2018-01-20 DIAGNOSIS — N186 End stage renal disease: Secondary | ICD-10-CM | POA: Diagnosis not present

## 2018-01-20 DIAGNOSIS — N2581 Secondary hyperparathyroidism of renal origin: Secondary | ICD-10-CM | POA: Diagnosis not present

## 2018-01-21 DIAGNOSIS — D631 Anemia in chronic kidney disease: Secondary | ICD-10-CM | POA: Diagnosis not present

## 2018-01-21 DIAGNOSIS — N186 End stage renal disease: Secondary | ICD-10-CM | POA: Diagnosis not present

## 2018-01-21 DIAGNOSIS — Z418 Encounter for other procedures for purposes other than remedying health state: Secondary | ICD-10-CM | POA: Diagnosis not present

## 2018-01-21 DIAGNOSIS — N2581 Secondary hyperparathyroidism of renal origin: Secondary | ICD-10-CM | POA: Diagnosis not present

## 2018-01-21 DIAGNOSIS — L089 Local infection of the skin and subcutaneous tissue, unspecified: Secondary | ICD-10-CM | POA: Diagnosis not present

## 2018-01-24 DIAGNOSIS — N186 End stage renal disease: Secondary | ICD-10-CM | POA: Diagnosis not present

## 2018-01-24 DIAGNOSIS — N2581 Secondary hyperparathyroidism of renal origin: Secondary | ICD-10-CM | POA: Diagnosis not present

## 2018-01-24 DIAGNOSIS — D631 Anemia in chronic kidney disease: Secondary | ICD-10-CM | POA: Diagnosis not present

## 2018-01-24 DIAGNOSIS — Z418 Encounter for other procedures for purposes other than remedying health state: Secondary | ICD-10-CM | POA: Diagnosis not present

## 2018-01-24 DIAGNOSIS — L089 Local infection of the skin and subcutaneous tissue, unspecified: Secondary | ICD-10-CM | POA: Diagnosis not present

## 2018-01-26 DIAGNOSIS — D631 Anemia in chronic kidney disease: Secondary | ICD-10-CM | POA: Diagnosis not present

## 2018-01-26 DIAGNOSIS — L089 Local infection of the skin and subcutaneous tissue, unspecified: Secondary | ICD-10-CM | POA: Diagnosis not present

## 2018-01-26 DIAGNOSIS — E119 Type 2 diabetes mellitus without complications: Secondary | ICD-10-CM | POA: Diagnosis not present

## 2018-01-26 DIAGNOSIS — Z418 Encounter for other procedures for purposes other than remedying health state: Secondary | ICD-10-CM | POA: Diagnosis not present

## 2018-01-26 DIAGNOSIS — N186 End stage renal disease: Secondary | ICD-10-CM | POA: Diagnosis not present

## 2018-01-26 DIAGNOSIS — N2581 Secondary hyperparathyroidism of renal origin: Secondary | ICD-10-CM | POA: Diagnosis not present

## 2018-01-28 DIAGNOSIS — Z418 Encounter for other procedures for purposes other than remedying health state: Secondary | ICD-10-CM | POA: Diagnosis not present

## 2018-01-28 DIAGNOSIS — N2581 Secondary hyperparathyroidism of renal origin: Secondary | ICD-10-CM | POA: Diagnosis not present

## 2018-01-28 DIAGNOSIS — N186 End stage renal disease: Secondary | ICD-10-CM | POA: Diagnosis not present

## 2018-01-28 DIAGNOSIS — L089 Local infection of the skin and subcutaneous tissue, unspecified: Secondary | ICD-10-CM | POA: Diagnosis not present

## 2018-01-28 DIAGNOSIS — D631 Anemia in chronic kidney disease: Secondary | ICD-10-CM | POA: Diagnosis not present

## 2018-01-31 DIAGNOSIS — D631 Anemia in chronic kidney disease: Secondary | ICD-10-CM | POA: Diagnosis not present

## 2018-01-31 DIAGNOSIS — Z418 Encounter for other procedures for purposes other than remedying health state: Secondary | ICD-10-CM | POA: Diagnosis not present

## 2018-01-31 DIAGNOSIS — N2581 Secondary hyperparathyroidism of renal origin: Secondary | ICD-10-CM | POA: Diagnosis not present

## 2018-01-31 DIAGNOSIS — N186 End stage renal disease: Secondary | ICD-10-CM | POA: Diagnosis not present

## 2018-01-31 DIAGNOSIS — L089 Local infection of the skin and subcutaneous tissue, unspecified: Secondary | ICD-10-CM | POA: Diagnosis not present

## 2018-02-02 DIAGNOSIS — L089 Local infection of the skin and subcutaneous tissue, unspecified: Secondary | ICD-10-CM | POA: Diagnosis not present

## 2018-02-02 DIAGNOSIS — N186 End stage renal disease: Secondary | ICD-10-CM | POA: Diagnosis not present

## 2018-02-02 DIAGNOSIS — Z418 Encounter for other procedures for purposes other than remedying health state: Secondary | ICD-10-CM | POA: Diagnosis not present

## 2018-02-02 DIAGNOSIS — N2581 Secondary hyperparathyroidism of renal origin: Secondary | ICD-10-CM | POA: Diagnosis not present

## 2018-02-02 DIAGNOSIS — D631 Anemia in chronic kidney disease: Secondary | ICD-10-CM | POA: Diagnosis not present

## 2018-02-04 DIAGNOSIS — N2581 Secondary hyperparathyroidism of renal origin: Secondary | ICD-10-CM | POA: Diagnosis not present

## 2018-02-04 DIAGNOSIS — N186 End stage renal disease: Secondary | ICD-10-CM | POA: Diagnosis not present

## 2018-02-04 DIAGNOSIS — Z418 Encounter for other procedures for purposes other than remedying health state: Secondary | ICD-10-CM | POA: Diagnosis not present

## 2018-02-04 DIAGNOSIS — L089 Local infection of the skin and subcutaneous tissue, unspecified: Secondary | ICD-10-CM | POA: Diagnosis not present

## 2018-02-04 DIAGNOSIS — D631 Anemia in chronic kidney disease: Secondary | ICD-10-CM | POA: Diagnosis not present

## 2018-02-07 DIAGNOSIS — D631 Anemia in chronic kidney disease: Secondary | ICD-10-CM | POA: Diagnosis not present

## 2018-02-07 DIAGNOSIS — N2581 Secondary hyperparathyroidism of renal origin: Secondary | ICD-10-CM | POA: Diagnosis not present

## 2018-02-07 DIAGNOSIS — N186 End stage renal disease: Secondary | ICD-10-CM | POA: Diagnosis not present

## 2018-02-07 DIAGNOSIS — Z418 Encounter for other procedures for purposes other than remedying health state: Secondary | ICD-10-CM | POA: Diagnosis not present

## 2018-02-07 DIAGNOSIS — L089 Local infection of the skin and subcutaneous tissue, unspecified: Secondary | ICD-10-CM | POA: Diagnosis not present

## 2018-02-08 DIAGNOSIS — Z6839 Body mass index (BMI) 39.0-39.9, adult: Secondary | ICD-10-CM | POA: Diagnosis not present

## 2018-02-08 DIAGNOSIS — Z89021 Acquired absence of right finger(s): Secondary | ICD-10-CM | POA: Diagnosis not present

## 2018-02-08 DIAGNOSIS — Z9181 History of falling: Secondary | ICD-10-CM | POA: Diagnosis not present

## 2018-02-08 DIAGNOSIS — Z940 Kidney transplant status: Secondary | ICD-10-CM | POA: Diagnosis not present

## 2018-02-08 DIAGNOSIS — E1122 Type 2 diabetes mellitus with diabetic chronic kidney disease: Secondary | ICD-10-CM | POA: Diagnosis not present

## 2018-02-08 DIAGNOSIS — I509 Heart failure, unspecified: Secondary | ICD-10-CM | POA: Diagnosis not present

## 2018-02-08 DIAGNOSIS — E669 Obesity, unspecified: Secondary | ICD-10-CM | POA: Diagnosis not present

## 2018-02-08 DIAGNOSIS — I132 Hypertensive heart and chronic kidney disease with heart failure and with stage 5 chronic kidney disease, or end stage renal disease: Secondary | ICD-10-CM | POA: Diagnosis not present

## 2018-02-08 DIAGNOSIS — E1152 Type 2 diabetes mellitus with diabetic peripheral angiopathy with gangrene: Secondary | ICD-10-CM | POA: Diagnosis not present

## 2018-02-08 DIAGNOSIS — I998 Other disorder of circulatory system: Secondary | ICD-10-CM | POA: Diagnosis not present

## 2018-02-08 DIAGNOSIS — Z7982 Long term (current) use of aspirin: Secondary | ICD-10-CM | POA: Diagnosis not present

## 2018-02-08 DIAGNOSIS — Z89022 Acquired absence of left finger(s): Secondary | ICD-10-CM | POA: Diagnosis not present

## 2018-02-08 DIAGNOSIS — Z992 Dependence on renal dialysis: Secondary | ICD-10-CM | POA: Diagnosis not present

## 2018-02-08 DIAGNOSIS — N186 End stage renal disease: Secondary | ICD-10-CM | POA: Diagnosis not present

## 2018-02-08 DIAGNOSIS — D631 Anemia in chronic kidney disease: Secondary | ICD-10-CM | POA: Diagnosis not present

## 2018-02-08 DIAGNOSIS — Z794 Long term (current) use of insulin: Secondary | ICD-10-CM | POA: Diagnosis not present

## 2018-02-08 DIAGNOSIS — S81801D Unspecified open wound, right lower leg, subsequent encounter: Secondary | ICD-10-CM | POA: Diagnosis not present

## 2018-02-09 DIAGNOSIS — D631 Anemia in chronic kidney disease: Secondary | ICD-10-CM | POA: Diagnosis not present

## 2018-02-09 DIAGNOSIS — N186 End stage renal disease: Secondary | ICD-10-CM | POA: Diagnosis not present

## 2018-02-09 DIAGNOSIS — N2581 Secondary hyperparathyroidism of renal origin: Secondary | ICD-10-CM | POA: Diagnosis not present

## 2018-02-09 DIAGNOSIS — L089 Local infection of the skin and subcutaneous tissue, unspecified: Secondary | ICD-10-CM | POA: Diagnosis not present

## 2018-02-09 DIAGNOSIS — Z418 Encounter for other procedures for purposes other than remedying health state: Secondary | ICD-10-CM | POA: Diagnosis not present

## 2018-02-10 DIAGNOSIS — I132 Hypertensive heart and chronic kidney disease with heart failure and with stage 5 chronic kidney disease, or end stage renal disease: Secondary | ICD-10-CM | POA: Diagnosis not present

## 2018-02-10 DIAGNOSIS — N186 End stage renal disease: Secondary | ICD-10-CM | POA: Diagnosis not present

## 2018-02-10 DIAGNOSIS — E1122 Type 2 diabetes mellitus with diabetic chronic kidney disease: Secondary | ICD-10-CM | POA: Diagnosis not present

## 2018-02-10 DIAGNOSIS — E1152 Type 2 diabetes mellitus with diabetic peripheral angiopathy with gangrene: Secondary | ICD-10-CM | POA: Diagnosis not present

## 2018-02-10 DIAGNOSIS — I509 Heart failure, unspecified: Secondary | ICD-10-CM | POA: Diagnosis not present

## 2018-02-10 DIAGNOSIS — S81801D Unspecified open wound, right lower leg, subsequent encounter: Secondary | ICD-10-CM | POA: Diagnosis not present

## 2018-02-11 DIAGNOSIS — L089 Local infection of the skin and subcutaneous tissue, unspecified: Secondary | ICD-10-CM | POA: Diagnosis not present

## 2018-02-11 DIAGNOSIS — Z418 Encounter for other procedures for purposes other than remedying health state: Secondary | ICD-10-CM | POA: Diagnosis not present

## 2018-02-11 DIAGNOSIS — N186 End stage renal disease: Secondary | ICD-10-CM | POA: Diagnosis not present

## 2018-02-11 DIAGNOSIS — N2581 Secondary hyperparathyroidism of renal origin: Secondary | ICD-10-CM | POA: Diagnosis not present

## 2018-02-11 DIAGNOSIS — D631 Anemia in chronic kidney disease: Secondary | ICD-10-CM | POA: Diagnosis not present

## 2018-02-12 DIAGNOSIS — S81801D Unspecified open wound, right lower leg, subsequent encounter: Secondary | ICD-10-CM | POA: Diagnosis not present

## 2018-02-12 DIAGNOSIS — N186 End stage renal disease: Secondary | ICD-10-CM | POA: Diagnosis not present

## 2018-02-12 DIAGNOSIS — E1122 Type 2 diabetes mellitus with diabetic chronic kidney disease: Secondary | ICD-10-CM | POA: Diagnosis not present

## 2018-02-12 DIAGNOSIS — I132 Hypertensive heart and chronic kidney disease with heart failure and with stage 5 chronic kidney disease, or end stage renal disease: Secondary | ICD-10-CM | POA: Diagnosis not present

## 2018-02-12 DIAGNOSIS — E1152 Type 2 diabetes mellitus with diabetic peripheral angiopathy with gangrene: Secondary | ICD-10-CM | POA: Diagnosis not present

## 2018-02-12 DIAGNOSIS — I509 Heart failure, unspecified: Secondary | ICD-10-CM | POA: Diagnosis not present

## 2018-02-13 DIAGNOSIS — E1152 Type 2 diabetes mellitus with diabetic peripheral angiopathy with gangrene: Secondary | ICD-10-CM | POA: Diagnosis not present

## 2018-02-13 DIAGNOSIS — I445 Left posterior fascicular block: Secondary | ICD-10-CM | POA: Diagnosis not present

## 2018-02-13 DIAGNOSIS — I5020 Unspecified systolic (congestive) heart failure: Secondary | ICD-10-CM | POA: Diagnosis not present

## 2018-02-13 DIAGNOSIS — E0800 Diabetes mellitus due to underlying condition with hyperosmolarity without nonketotic hyperglycemic-hyperosmolar coma (NKHHC): Secondary | ICD-10-CM | POA: Diagnosis not present

## 2018-02-13 DIAGNOSIS — E1122 Type 2 diabetes mellitus with diabetic chronic kidney disease: Secondary | ICD-10-CM | POA: Diagnosis not present

## 2018-02-13 DIAGNOSIS — I132 Hypertensive heart and chronic kidney disease with heart failure and with stage 5 chronic kidney disease, or end stage renal disease: Secondary | ICD-10-CM | POA: Diagnosis not present

## 2018-02-13 DIAGNOSIS — I509 Heart failure, unspecified: Secondary | ICD-10-CM | POA: Diagnosis not present

## 2018-02-13 DIAGNOSIS — N2581 Secondary hyperparathyroidism of renal origin: Secondary | ICD-10-CM | POA: Diagnosis not present

## 2018-02-13 DIAGNOSIS — Z418 Encounter for other procedures for purposes other than remedying health state: Secondary | ICD-10-CM | POA: Diagnosis not present

## 2018-02-13 DIAGNOSIS — I823 Embolism and thrombosis of renal vein: Secondary | ICD-10-CM | POA: Diagnosis not present

## 2018-02-13 DIAGNOSIS — S81801D Unspecified open wound, right lower leg, subsequent encounter: Secondary | ICD-10-CM | POA: Diagnosis not present

## 2018-02-13 DIAGNOSIS — D631 Anemia in chronic kidney disease: Secondary | ICD-10-CM | POA: Diagnosis not present

## 2018-02-13 DIAGNOSIS — N186 End stage renal disease: Secondary | ICD-10-CM | POA: Diagnosis not present

## 2018-02-13 DIAGNOSIS — L089 Local infection of the skin and subcutaneous tissue, unspecified: Secondary | ICD-10-CM | POA: Diagnosis not present

## 2018-02-13 DIAGNOSIS — R9431 Abnormal electrocardiogram [ECG] [EKG]: Secondary | ICD-10-CM | POA: Diagnosis not present

## 2018-02-13 DIAGNOSIS — T8619 Other complication of kidney transplant: Secondary | ICD-10-CM | POA: Diagnosis not present

## 2018-02-13 DIAGNOSIS — I110 Hypertensive heart disease with heart failure: Secondary | ICD-10-CM | POA: Diagnosis not present

## 2018-02-15 DIAGNOSIS — D631 Anemia in chronic kidney disease: Secondary | ICD-10-CM | POA: Diagnosis not present

## 2018-02-15 DIAGNOSIS — Z418 Encounter for other procedures for purposes other than remedying health state: Secondary | ICD-10-CM | POA: Diagnosis not present

## 2018-02-15 DIAGNOSIS — E1122 Type 2 diabetes mellitus with diabetic chronic kidney disease: Secondary | ICD-10-CM | POA: Diagnosis not present

## 2018-02-15 DIAGNOSIS — S81801D Unspecified open wound, right lower leg, subsequent encounter: Secondary | ICD-10-CM | POA: Diagnosis not present

## 2018-02-15 DIAGNOSIS — L089 Local infection of the skin and subcutaneous tissue, unspecified: Secondary | ICD-10-CM | POA: Diagnosis not present

## 2018-02-15 DIAGNOSIS — I509 Heart failure, unspecified: Secondary | ICD-10-CM | POA: Diagnosis not present

## 2018-02-15 DIAGNOSIS — I132 Hypertensive heart and chronic kidney disease with heart failure and with stage 5 chronic kidney disease, or end stage renal disease: Secondary | ICD-10-CM | POA: Diagnosis not present

## 2018-02-15 DIAGNOSIS — N186 End stage renal disease: Secondary | ICD-10-CM | POA: Diagnosis not present

## 2018-02-15 DIAGNOSIS — E1152 Type 2 diabetes mellitus with diabetic peripheral angiopathy with gangrene: Secondary | ICD-10-CM | POA: Diagnosis not present

## 2018-02-15 DIAGNOSIS — N2581 Secondary hyperparathyroidism of renal origin: Secondary | ICD-10-CM | POA: Diagnosis not present

## 2018-02-17 DIAGNOSIS — E1152 Type 2 diabetes mellitus with diabetic peripheral angiopathy with gangrene: Secondary | ICD-10-CM | POA: Diagnosis not present

## 2018-02-17 DIAGNOSIS — E1122 Type 2 diabetes mellitus with diabetic chronic kidney disease: Secondary | ICD-10-CM | POA: Diagnosis not present

## 2018-02-17 DIAGNOSIS — N186 End stage renal disease: Secondary | ICD-10-CM | POA: Diagnosis not present

## 2018-02-17 DIAGNOSIS — I132 Hypertensive heart and chronic kidney disease with heart failure and with stage 5 chronic kidney disease, or end stage renal disease: Secondary | ICD-10-CM | POA: Diagnosis not present

## 2018-02-17 DIAGNOSIS — I509 Heart failure, unspecified: Secondary | ICD-10-CM | POA: Diagnosis not present

## 2018-02-17 DIAGNOSIS — S81801D Unspecified open wound, right lower leg, subsequent encounter: Secondary | ICD-10-CM | POA: Diagnosis not present

## 2018-02-18 DIAGNOSIS — N2581 Secondary hyperparathyroidism of renal origin: Secondary | ICD-10-CM | POA: Diagnosis not present

## 2018-02-18 DIAGNOSIS — N186 End stage renal disease: Secondary | ICD-10-CM | POA: Diagnosis not present

## 2018-02-18 DIAGNOSIS — D631 Anemia in chronic kidney disease: Secondary | ICD-10-CM | POA: Diagnosis not present

## 2018-02-18 DIAGNOSIS — Z992 Dependence on renal dialysis: Secondary | ICD-10-CM | POA: Diagnosis not present

## 2018-02-18 DIAGNOSIS — L089 Local infection of the skin and subcutaneous tissue, unspecified: Secondary | ICD-10-CM | POA: Diagnosis not present

## 2018-02-18 DIAGNOSIS — Z418 Encounter for other procedures for purposes other than remedying health state: Secondary | ICD-10-CM | POA: Diagnosis not present

## 2018-02-19 DIAGNOSIS — D631 Anemia in chronic kidney disease: Secondary | ICD-10-CM | POA: Diagnosis not present

## 2018-02-19 DIAGNOSIS — Z23 Encounter for immunization: Secondary | ICD-10-CM | POA: Diagnosis not present

## 2018-02-19 DIAGNOSIS — N186 End stage renal disease: Secondary | ICD-10-CM | POA: Diagnosis not present

## 2018-02-19 DIAGNOSIS — N2581 Secondary hyperparathyroidism of renal origin: Secondary | ICD-10-CM | POA: Diagnosis not present

## 2018-02-20 DIAGNOSIS — I509 Heart failure, unspecified: Secondary | ICD-10-CM | POA: Diagnosis not present

## 2018-02-20 DIAGNOSIS — E1122 Type 2 diabetes mellitus with diabetic chronic kidney disease: Secondary | ICD-10-CM | POA: Diagnosis not present

## 2018-02-20 DIAGNOSIS — N186 End stage renal disease: Secondary | ICD-10-CM | POA: Diagnosis not present

## 2018-02-20 DIAGNOSIS — I132 Hypertensive heart and chronic kidney disease with heart failure and with stage 5 chronic kidney disease, or end stage renal disease: Secondary | ICD-10-CM | POA: Diagnosis not present

## 2018-02-20 DIAGNOSIS — S81801D Unspecified open wound, right lower leg, subsequent encounter: Secondary | ICD-10-CM | POA: Diagnosis not present

## 2018-02-20 DIAGNOSIS — E1152 Type 2 diabetes mellitus with diabetic peripheral angiopathy with gangrene: Secondary | ICD-10-CM | POA: Diagnosis not present

## 2018-02-21 DIAGNOSIS — S81801D Unspecified open wound, right lower leg, subsequent encounter: Secondary | ICD-10-CM | POA: Diagnosis not present

## 2018-02-21 DIAGNOSIS — E1152 Type 2 diabetes mellitus with diabetic peripheral angiopathy with gangrene: Secondary | ICD-10-CM | POA: Diagnosis not present

## 2018-02-21 DIAGNOSIS — N2581 Secondary hyperparathyroidism of renal origin: Secondary | ICD-10-CM | POA: Diagnosis not present

## 2018-02-21 DIAGNOSIS — I132 Hypertensive heart and chronic kidney disease with heart failure and with stage 5 chronic kidney disease, or end stage renal disease: Secondary | ICD-10-CM | POA: Diagnosis not present

## 2018-02-21 DIAGNOSIS — D631 Anemia in chronic kidney disease: Secondary | ICD-10-CM | POA: Diagnosis not present

## 2018-02-21 DIAGNOSIS — I509 Heart failure, unspecified: Secondary | ICD-10-CM | POA: Diagnosis not present

## 2018-02-21 DIAGNOSIS — Z23 Encounter for immunization: Secondary | ICD-10-CM | POA: Diagnosis not present

## 2018-02-21 DIAGNOSIS — N186 End stage renal disease: Secondary | ICD-10-CM | POA: Diagnosis not present

## 2018-02-21 DIAGNOSIS — E1122 Type 2 diabetes mellitus with diabetic chronic kidney disease: Secondary | ICD-10-CM | POA: Diagnosis not present

## 2018-02-22 DIAGNOSIS — Z89022 Acquired absence of left finger(s): Secondary | ICD-10-CM | POA: Diagnosis not present

## 2018-02-22 DIAGNOSIS — I998 Other disorder of circulatory system: Secondary | ICD-10-CM | POA: Diagnosis not present

## 2018-02-22 DIAGNOSIS — L97119 Non-pressure chronic ulcer of right thigh with unspecified severity: Secondary | ICD-10-CM | POA: Diagnosis not present

## 2018-02-22 DIAGNOSIS — Z6841 Body Mass Index (BMI) 40.0 and over, adult: Secondary | ICD-10-CM | POA: Diagnosis not present

## 2018-02-22 DIAGNOSIS — E1151 Type 2 diabetes mellitus with diabetic peripheral angiopathy without gangrene: Secondary | ICD-10-CM | POA: Diagnosis not present

## 2018-02-22 DIAGNOSIS — E10622 Type 1 diabetes mellitus with other skin ulcer: Secondary | ICD-10-CM | POA: Diagnosis not present

## 2018-02-22 DIAGNOSIS — E1022 Type 1 diabetes mellitus with diabetic chronic kidney disease: Secondary | ICD-10-CM | POA: Diagnosis not present

## 2018-02-22 DIAGNOSIS — I10 Essential (primary) hypertension: Secondary | ICD-10-CM | POA: Diagnosis not present

## 2018-02-22 DIAGNOSIS — T8189XA Other complications of procedures, not elsewhere classified, initial encounter: Secondary | ICD-10-CM | POA: Diagnosis not present

## 2018-02-22 DIAGNOSIS — D649 Anemia, unspecified: Secondary | ICD-10-CM | POA: Diagnosis not present

## 2018-02-22 DIAGNOSIS — K219 Gastro-esophageal reflux disease without esophagitis: Secondary | ICD-10-CM | POA: Diagnosis not present

## 2018-02-22 DIAGNOSIS — E1165 Type 2 diabetes mellitus with hyperglycemia: Secondary | ICD-10-CM | POA: Diagnosis not present

## 2018-02-22 DIAGNOSIS — E1122 Type 2 diabetes mellitus with diabetic chronic kidney disease: Secondary | ICD-10-CM | POA: Diagnosis not present

## 2018-02-22 DIAGNOSIS — L97111 Non-pressure chronic ulcer of right thigh limited to breakdown of skin: Secondary | ICD-10-CM | POA: Diagnosis not present

## 2018-02-22 DIAGNOSIS — E875 Hyperkalemia: Secondary | ICD-10-CM | POA: Diagnosis not present

## 2018-02-22 DIAGNOSIS — N186 End stage renal disease: Secondary | ICD-10-CM | POA: Diagnosis not present

## 2018-02-22 DIAGNOSIS — T8752 Necrosis of amputation stump, left upper extremity: Secondary | ICD-10-CM | POA: Diagnosis not present

## 2018-02-22 DIAGNOSIS — I96 Gangrene, not elsewhere classified: Secondary | ICD-10-CM | POA: Diagnosis not present

## 2018-02-22 DIAGNOSIS — Z794 Long term (current) use of insulin: Secondary | ICD-10-CM | POA: Diagnosis not present

## 2018-02-22 DIAGNOSIS — E669 Obesity, unspecified: Secondary | ICD-10-CM | POA: Diagnosis not present

## 2018-02-22 DIAGNOSIS — I509 Heart failure, unspecified: Secondary | ICD-10-CM | POA: Diagnosis not present

## 2018-02-22 DIAGNOSIS — Z905 Acquired absence of kidney: Secondary | ICD-10-CM | POA: Diagnosis not present

## 2018-02-22 DIAGNOSIS — I429 Cardiomyopathy, unspecified: Secondary | ICD-10-CM | POA: Diagnosis not present

## 2018-02-22 DIAGNOSIS — Z992 Dependence on renal dialysis: Secondary | ICD-10-CM | POA: Diagnosis not present

## 2018-02-22 DIAGNOSIS — Z7982 Long term (current) use of aspirin: Secondary | ICD-10-CM | POA: Diagnosis not present

## 2018-02-22 DIAGNOSIS — Z86718 Personal history of other venous thrombosis and embolism: Secondary | ICD-10-CM | POA: Diagnosis not present

## 2018-02-22 DIAGNOSIS — Z79899 Other long term (current) drug therapy: Secondary | ICD-10-CM | POA: Diagnosis not present

## 2018-02-22 DIAGNOSIS — I132 Hypertensive heart and chronic kidney disease with heart failure and with stage 5 chronic kidney disease, or end stage renal disease: Secondary | ICD-10-CM | POA: Diagnosis not present

## 2018-02-23 DIAGNOSIS — Z125 Encounter for screening for malignant neoplasm of prostate: Secondary | ICD-10-CM | POA: Diagnosis not present

## 2018-02-23 DIAGNOSIS — D631 Anemia in chronic kidney disease: Secondary | ICD-10-CM | POA: Diagnosis not present

## 2018-02-23 DIAGNOSIS — Z23 Encounter for immunization: Secondary | ICD-10-CM | POA: Diagnosis not present

## 2018-02-23 DIAGNOSIS — E119 Type 2 diabetes mellitus without complications: Secondary | ICD-10-CM | POA: Diagnosis not present

## 2018-02-23 DIAGNOSIS — N2581 Secondary hyperparathyroidism of renal origin: Secondary | ICD-10-CM | POA: Diagnosis not present

## 2018-02-23 DIAGNOSIS — N186 End stage renal disease: Secondary | ICD-10-CM | POA: Diagnosis not present

## 2018-02-24 ENCOUNTER — Encounter (INDEPENDENT_AMBULATORY_CARE_PROVIDER_SITE_OTHER): Payer: Medicare Other | Admitting: Ophthalmology

## 2018-02-25 DIAGNOSIS — I132 Hypertensive heart and chronic kidney disease with heart failure and with stage 5 chronic kidney disease, or end stage renal disease: Secondary | ICD-10-CM | POA: Diagnosis not present

## 2018-02-25 DIAGNOSIS — D631 Anemia in chronic kidney disease: Secondary | ICD-10-CM | POA: Diagnosis not present

## 2018-02-25 DIAGNOSIS — E1122 Type 2 diabetes mellitus with diabetic chronic kidney disease: Secondary | ICD-10-CM | POA: Diagnosis not present

## 2018-02-25 DIAGNOSIS — Z23 Encounter for immunization: Secondary | ICD-10-CM | POA: Diagnosis not present

## 2018-02-25 DIAGNOSIS — N186 End stage renal disease: Secondary | ICD-10-CM | POA: Diagnosis not present

## 2018-02-25 DIAGNOSIS — S81801D Unspecified open wound, right lower leg, subsequent encounter: Secondary | ICD-10-CM | POA: Diagnosis not present

## 2018-02-25 DIAGNOSIS — E1152 Type 2 diabetes mellitus with diabetic peripheral angiopathy with gangrene: Secondary | ICD-10-CM | POA: Diagnosis not present

## 2018-02-25 DIAGNOSIS — I509 Heart failure, unspecified: Secondary | ICD-10-CM | POA: Diagnosis not present

## 2018-02-25 DIAGNOSIS — N2581 Secondary hyperparathyroidism of renal origin: Secondary | ICD-10-CM | POA: Diagnosis not present

## 2018-02-27 DIAGNOSIS — I509 Heart failure, unspecified: Secondary | ICD-10-CM | POA: Diagnosis not present

## 2018-02-27 DIAGNOSIS — E1152 Type 2 diabetes mellitus with diabetic peripheral angiopathy with gangrene: Secondary | ICD-10-CM | POA: Diagnosis not present

## 2018-02-27 DIAGNOSIS — I132 Hypertensive heart and chronic kidney disease with heart failure and with stage 5 chronic kidney disease, or end stage renal disease: Secondary | ICD-10-CM | POA: Diagnosis not present

## 2018-02-27 DIAGNOSIS — E1122 Type 2 diabetes mellitus with diabetic chronic kidney disease: Secondary | ICD-10-CM | POA: Diagnosis not present

## 2018-02-27 DIAGNOSIS — N186 End stage renal disease: Secondary | ICD-10-CM | POA: Diagnosis not present

## 2018-02-27 DIAGNOSIS — S81801D Unspecified open wound, right lower leg, subsequent encounter: Secondary | ICD-10-CM | POA: Diagnosis not present

## 2018-02-28 DIAGNOSIS — N2581 Secondary hyperparathyroidism of renal origin: Secondary | ICD-10-CM | POA: Diagnosis not present

## 2018-02-28 DIAGNOSIS — N186 End stage renal disease: Secondary | ICD-10-CM | POA: Diagnosis not present

## 2018-02-28 DIAGNOSIS — Z79890 Hormone replacement therapy (postmenopausal): Secondary | ICD-10-CM | POA: Diagnosis not present

## 2018-02-28 DIAGNOSIS — I998 Other disorder of circulatory system: Secondary | ICD-10-CM | POA: Diagnosis not present

## 2018-02-28 DIAGNOSIS — Z79891 Long term (current) use of opiate analgesic: Secondary | ICD-10-CM | POA: Diagnosis not present

## 2018-02-28 DIAGNOSIS — D631 Anemia in chronic kidney disease: Secondary | ICD-10-CM | POA: Diagnosis not present

## 2018-02-28 DIAGNOSIS — I5030 Unspecified diastolic (congestive) heart failure: Secondary | ICD-10-CM | POA: Diagnosis not present

## 2018-02-28 DIAGNOSIS — E1140 Type 2 diabetes mellitus with diabetic neuropathy, unspecified: Secondary | ICD-10-CM | POA: Diagnosis not present

## 2018-02-28 DIAGNOSIS — R55 Syncope and collapse: Secondary | ICD-10-CM | POA: Diagnosis not present

## 2018-02-28 DIAGNOSIS — Z23 Encounter for immunization: Secondary | ICD-10-CM | POA: Diagnosis not present

## 2018-02-28 DIAGNOSIS — E7800 Pure hypercholesterolemia, unspecified: Secondary | ICD-10-CM | POA: Diagnosis not present

## 2018-02-28 DIAGNOSIS — E875 Hyperkalemia: Secondary | ICD-10-CM | POA: Diagnosis not present

## 2018-03-01 DIAGNOSIS — S81801D Unspecified open wound, right lower leg, subsequent encounter: Secondary | ICD-10-CM | POA: Diagnosis not present

## 2018-03-01 DIAGNOSIS — E1122 Type 2 diabetes mellitus with diabetic chronic kidney disease: Secondary | ICD-10-CM | POA: Diagnosis not present

## 2018-03-01 DIAGNOSIS — I509 Heart failure, unspecified: Secondary | ICD-10-CM | POA: Diagnosis not present

## 2018-03-01 DIAGNOSIS — E1152 Type 2 diabetes mellitus with diabetic peripheral angiopathy with gangrene: Secondary | ICD-10-CM | POA: Diagnosis not present

## 2018-03-01 DIAGNOSIS — N186 End stage renal disease: Secondary | ICD-10-CM | POA: Diagnosis not present

## 2018-03-01 DIAGNOSIS — I132 Hypertensive heart and chronic kidney disease with heart failure and with stage 5 chronic kidney disease, or end stage renal disease: Secondary | ICD-10-CM | POA: Diagnosis not present

## 2018-03-02 DIAGNOSIS — N2581 Secondary hyperparathyroidism of renal origin: Secondary | ICD-10-CM | POA: Diagnosis not present

## 2018-03-02 DIAGNOSIS — Z23 Encounter for immunization: Secondary | ICD-10-CM | POA: Diagnosis not present

## 2018-03-02 DIAGNOSIS — N186 End stage renal disease: Secondary | ICD-10-CM | POA: Diagnosis not present

## 2018-03-02 DIAGNOSIS — D631 Anemia in chronic kidney disease: Secondary | ICD-10-CM | POA: Diagnosis not present

## 2018-03-03 DIAGNOSIS — E1122 Type 2 diabetes mellitus with diabetic chronic kidney disease: Secondary | ICD-10-CM | POA: Diagnosis not present

## 2018-03-03 DIAGNOSIS — E1152 Type 2 diabetes mellitus with diabetic peripheral angiopathy with gangrene: Secondary | ICD-10-CM | POA: Diagnosis not present

## 2018-03-03 DIAGNOSIS — S81801D Unspecified open wound, right lower leg, subsequent encounter: Secondary | ICD-10-CM | POA: Diagnosis not present

## 2018-03-03 DIAGNOSIS — N186 End stage renal disease: Secondary | ICD-10-CM | POA: Diagnosis not present

## 2018-03-03 DIAGNOSIS — I132 Hypertensive heart and chronic kidney disease with heart failure and with stage 5 chronic kidney disease, or end stage renal disease: Secondary | ICD-10-CM | POA: Diagnosis not present

## 2018-03-03 DIAGNOSIS — I509 Heart failure, unspecified: Secondary | ICD-10-CM | POA: Diagnosis not present

## 2018-03-04 DIAGNOSIS — N2581 Secondary hyperparathyroidism of renal origin: Secondary | ICD-10-CM | POA: Diagnosis not present

## 2018-03-04 DIAGNOSIS — N186 End stage renal disease: Secondary | ICD-10-CM | POA: Diagnosis not present

## 2018-03-04 DIAGNOSIS — D631 Anemia in chronic kidney disease: Secondary | ICD-10-CM | POA: Diagnosis not present

## 2018-03-04 DIAGNOSIS — Z23 Encounter for immunization: Secondary | ICD-10-CM | POA: Diagnosis not present

## 2018-03-06 DIAGNOSIS — I132 Hypertensive heart and chronic kidney disease with heart failure and with stage 5 chronic kidney disease, or end stage renal disease: Secondary | ICD-10-CM | POA: Diagnosis not present

## 2018-03-06 DIAGNOSIS — S81801D Unspecified open wound, right lower leg, subsequent encounter: Secondary | ICD-10-CM | POA: Diagnosis not present

## 2018-03-06 DIAGNOSIS — I509 Heart failure, unspecified: Secondary | ICD-10-CM | POA: Diagnosis not present

## 2018-03-06 DIAGNOSIS — E1122 Type 2 diabetes mellitus with diabetic chronic kidney disease: Secondary | ICD-10-CM | POA: Diagnosis not present

## 2018-03-06 DIAGNOSIS — E1152 Type 2 diabetes mellitus with diabetic peripheral angiopathy with gangrene: Secondary | ICD-10-CM | POA: Diagnosis not present

## 2018-03-06 DIAGNOSIS — N186 End stage renal disease: Secondary | ICD-10-CM | POA: Diagnosis not present

## 2018-03-07 DIAGNOSIS — N186 End stage renal disease: Secondary | ICD-10-CM | POA: Diagnosis not present

## 2018-03-07 DIAGNOSIS — N2581 Secondary hyperparathyroidism of renal origin: Secondary | ICD-10-CM | POA: Diagnosis not present

## 2018-03-07 DIAGNOSIS — D631 Anemia in chronic kidney disease: Secondary | ICD-10-CM | POA: Diagnosis not present

## 2018-03-07 DIAGNOSIS — Z23 Encounter for immunization: Secondary | ICD-10-CM | POA: Diagnosis not present

## 2018-03-09 DIAGNOSIS — Z23 Encounter for immunization: Secondary | ICD-10-CM | POA: Diagnosis not present

## 2018-03-09 DIAGNOSIS — N2581 Secondary hyperparathyroidism of renal origin: Secondary | ICD-10-CM | POA: Diagnosis not present

## 2018-03-09 DIAGNOSIS — N186 End stage renal disease: Secondary | ICD-10-CM | POA: Diagnosis not present

## 2018-03-09 DIAGNOSIS — D631 Anemia in chronic kidney disease: Secondary | ICD-10-CM | POA: Diagnosis not present

## 2018-03-10 DIAGNOSIS — S81801D Unspecified open wound, right lower leg, subsequent encounter: Secondary | ICD-10-CM | POA: Diagnosis not present

## 2018-03-10 DIAGNOSIS — T8249XA Other complication of vascular dialysis catheter, initial encounter: Secondary | ICD-10-CM | POA: Diagnosis not present

## 2018-03-10 DIAGNOSIS — I509 Heart failure, unspecified: Secondary | ICD-10-CM | POA: Diagnosis not present

## 2018-03-10 DIAGNOSIS — E1152 Type 2 diabetes mellitus with diabetic peripheral angiopathy with gangrene: Secondary | ICD-10-CM | POA: Diagnosis not present

## 2018-03-10 DIAGNOSIS — N186 End stage renal disease: Secondary | ICD-10-CM | POA: Diagnosis not present

## 2018-03-10 DIAGNOSIS — T82590A Other mechanical complication of surgically created arteriovenous fistula, initial encounter: Secondary | ICD-10-CM | POA: Diagnosis not present

## 2018-03-10 DIAGNOSIS — E1122 Type 2 diabetes mellitus with diabetic chronic kidney disease: Secondary | ICD-10-CM | POA: Diagnosis not present

## 2018-03-10 DIAGNOSIS — T82898A Other specified complication of vascular prosthetic devices, implants and grafts, initial encounter: Secondary | ICD-10-CM | POA: Diagnosis not present

## 2018-03-10 DIAGNOSIS — I132 Hypertensive heart and chronic kidney disease with heart failure and with stage 5 chronic kidney disease, or end stage renal disease: Secondary | ICD-10-CM | POA: Diagnosis not present

## 2018-03-11 DIAGNOSIS — Z23 Encounter for immunization: Secondary | ICD-10-CM | POA: Diagnosis not present

## 2018-03-11 DIAGNOSIS — D631 Anemia in chronic kidney disease: Secondary | ICD-10-CM | POA: Diagnosis not present

## 2018-03-11 DIAGNOSIS — N186 End stage renal disease: Secondary | ICD-10-CM | POA: Diagnosis not present

## 2018-03-11 DIAGNOSIS — N2581 Secondary hyperparathyroidism of renal origin: Secondary | ICD-10-CM | POA: Diagnosis not present

## 2018-03-12 DIAGNOSIS — I132 Hypertensive heart and chronic kidney disease with heart failure and with stage 5 chronic kidney disease, or end stage renal disease: Secondary | ICD-10-CM | POA: Diagnosis not present

## 2018-03-12 DIAGNOSIS — N186 End stage renal disease: Secondary | ICD-10-CM | POA: Diagnosis not present

## 2018-03-12 DIAGNOSIS — S81801D Unspecified open wound, right lower leg, subsequent encounter: Secondary | ICD-10-CM | POA: Diagnosis not present

## 2018-03-12 DIAGNOSIS — E1122 Type 2 diabetes mellitus with diabetic chronic kidney disease: Secondary | ICD-10-CM | POA: Diagnosis not present

## 2018-03-12 DIAGNOSIS — E1152 Type 2 diabetes mellitus with diabetic peripheral angiopathy with gangrene: Secondary | ICD-10-CM | POA: Diagnosis not present

## 2018-03-12 DIAGNOSIS — I509 Heart failure, unspecified: Secondary | ICD-10-CM | POA: Diagnosis not present

## 2018-03-13 DIAGNOSIS — D631 Anemia in chronic kidney disease: Secondary | ICD-10-CM | POA: Diagnosis not present

## 2018-03-13 DIAGNOSIS — N186 End stage renal disease: Secondary | ICD-10-CM | POA: Diagnosis not present

## 2018-03-13 DIAGNOSIS — N2581 Secondary hyperparathyroidism of renal origin: Secondary | ICD-10-CM | POA: Diagnosis not present

## 2018-03-13 DIAGNOSIS — Z23 Encounter for immunization: Secondary | ICD-10-CM | POA: Diagnosis not present

## 2018-03-14 DIAGNOSIS — N186 End stage renal disease: Secondary | ICD-10-CM | POA: Diagnosis not present

## 2018-03-14 DIAGNOSIS — S81801D Unspecified open wound, right lower leg, subsequent encounter: Secondary | ICD-10-CM | POA: Diagnosis not present

## 2018-03-14 DIAGNOSIS — I132 Hypertensive heart and chronic kidney disease with heart failure and with stage 5 chronic kidney disease, or end stage renal disease: Secondary | ICD-10-CM | POA: Diagnosis not present

## 2018-03-14 DIAGNOSIS — E1152 Type 2 diabetes mellitus with diabetic peripheral angiopathy with gangrene: Secondary | ICD-10-CM | POA: Diagnosis not present

## 2018-03-14 DIAGNOSIS — I509 Heart failure, unspecified: Secondary | ICD-10-CM | POA: Diagnosis not present

## 2018-03-14 DIAGNOSIS — E1122 Type 2 diabetes mellitus with diabetic chronic kidney disease: Secondary | ICD-10-CM | POA: Diagnosis not present

## 2018-03-16 DIAGNOSIS — D631 Anemia in chronic kidney disease: Secondary | ICD-10-CM | POA: Diagnosis not present

## 2018-03-16 DIAGNOSIS — N2581 Secondary hyperparathyroidism of renal origin: Secondary | ICD-10-CM | POA: Diagnosis not present

## 2018-03-16 DIAGNOSIS — Z23 Encounter for immunization: Secondary | ICD-10-CM | POA: Diagnosis not present

## 2018-03-16 DIAGNOSIS — N186 End stage renal disease: Secondary | ICD-10-CM | POA: Diagnosis not present

## 2018-03-17 DIAGNOSIS — E1152 Type 2 diabetes mellitus with diabetic peripheral angiopathy with gangrene: Secondary | ICD-10-CM | POA: Diagnosis not present

## 2018-03-17 DIAGNOSIS — E1122 Type 2 diabetes mellitus with diabetic chronic kidney disease: Secondary | ICD-10-CM | POA: Diagnosis not present

## 2018-03-17 DIAGNOSIS — N186 End stage renal disease: Secondary | ICD-10-CM | POA: Diagnosis not present

## 2018-03-17 DIAGNOSIS — S81801D Unspecified open wound, right lower leg, subsequent encounter: Secondary | ICD-10-CM | POA: Diagnosis not present

## 2018-03-17 DIAGNOSIS — I509 Heart failure, unspecified: Secondary | ICD-10-CM | POA: Diagnosis not present

## 2018-03-17 DIAGNOSIS — I132 Hypertensive heart and chronic kidney disease with heart failure and with stage 5 chronic kidney disease, or end stage renal disease: Secondary | ICD-10-CM | POA: Diagnosis not present

## 2018-03-18 DIAGNOSIS — D631 Anemia in chronic kidney disease: Secondary | ICD-10-CM | POA: Diagnosis not present

## 2018-03-18 DIAGNOSIS — N2581 Secondary hyperparathyroidism of renal origin: Secondary | ICD-10-CM | POA: Diagnosis not present

## 2018-03-18 DIAGNOSIS — Z23 Encounter for immunization: Secondary | ICD-10-CM | POA: Diagnosis not present

## 2018-03-18 DIAGNOSIS — N186 End stage renal disease: Secondary | ICD-10-CM | POA: Diagnosis not present

## 2018-03-20 DIAGNOSIS — E1152 Type 2 diabetes mellitus with diabetic peripheral angiopathy with gangrene: Secondary | ICD-10-CM | POA: Diagnosis not present

## 2018-03-20 DIAGNOSIS — Z23 Encounter for immunization: Secondary | ICD-10-CM | POA: Diagnosis not present

## 2018-03-20 DIAGNOSIS — S81801D Unspecified open wound, right lower leg, subsequent encounter: Secondary | ICD-10-CM | POA: Diagnosis not present

## 2018-03-20 DIAGNOSIS — E1122 Type 2 diabetes mellitus with diabetic chronic kidney disease: Secondary | ICD-10-CM | POA: Diagnosis not present

## 2018-03-20 DIAGNOSIS — D631 Anemia in chronic kidney disease: Secondary | ICD-10-CM | POA: Diagnosis not present

## 2018-03-20 DIAGNOSIS — I509 Heart failure, unspecified: Secondary | ICD-10-CM | POA: Diagnosis not present

## 2018-03-20 DIAGNOSIS — N186 End stage renal disease: Secondary | ICD-10-CM | POA: Diagnosis not present

## 2018-03-20 DIAGNOSIS — N2581 Secondary hyperparathyroidism of renal origin: Secondary | ICD-10-CM | POA: Diagnosis not present

## 2018-03-20 DIAGNOSIS — I132 Hypertensive heart and chronic kidney disease with heart failure and with stage 5 chronic kidney disease, or end stage renal disease: Secondary | ICD-10-CM | POA: Diagnosis not present

## 2018-03-21 DIAGNOSIS — N186 End stage renal disease: Secondary | ICD-10-CM | POA: Diagnosis not present

## 2018-03-21 DIAGNOSIS — Z992 Dependence on renal dialysis: Secondary | ICD-10-CM | POA: Diagnosis not present

## 2018-03-22 DIAGNOSIS — N186 End stage renal disease: Secondary | ICD-10-CM | POA: Diagnosis not present

## 2018-03-22 DIAGNOSIS — S81801D Unspecified open wound, right lower leg, subsequent encounter: Secondary | ICD-10-CM | POA: Diagnosis not present

## 2018-03-22 DIAGNOSIS — E1122 Type 2 diabetes mellitus with diabetic chronic kidney disease: Secondary | ICD-10-CM | POA: Diagnosis not present

## 2018-03-22 DIAGNOSIS — I509 Heart failure, unspecified: Secondary | ICD-10-CM | POA: Diagnosis not present

## 2018-03-22 DIAGNOSIS — I132 Hypertensive heart and chronic kidney disease with heart failure and with stage 5 chronic kidney disease, or end stage renal disease: Secondary | ICD-10-CM | POA: Diagnosis not present

## 2018-03-22 DIAGNOSIS — E1152 Type 2 diabetes mellitus with diabetic peripheral angiopathy with gangrene: Secondary | ICD-10-CM | POA: Diagnosis not present

## 2018-03-23 DIAGNOSIS — E8779 Other fluid overload: Secondary | ICD-10-CM | POA: Diagnosis not present

## 2018-03-23 DIAGNOSIS — N2581 Secondary hyperparathyroidism of renal origin: Secondary | ICD-10-CM | POA: Diagnosis not present

## 2018-03-23 DIAGNOSIS — D631 Anemia in chronic kidney disease: Secondary | ICD-10-CM | POA: Diagnosis not present

## 2018-03-23 DIAGNOSIS — E785 Hyperlipidemia, unspecified: Secondary | ICD-10-CM | POA: Diagnosis not present

## 2018-03-23 DIAGNOSIS — N186 End stage renal disease: Secondary | ICD-10-CM | POA: Diagnosis not present

## 2018-03-24 DIAGNOSIS — I132 Hypertensive heart and chronic kidney disease with heart failure and with stage 5 chronic kidney disease, or end stage renal disease: Secondary | ICD-10-CM | POA: Diagnosis not present

## 2018-03-24 DIAGNOSIS — S81801D Unspecified open wound, right lower leg, subsequent encounter: Secondary | ICD-10-CM | POA: Diagnosis not present

## 2018-03-24 DIAGNOSIS — E1122 Type 2 diabetes mellitus with diabetic chronic kidney disease: Secondary | ICD-10-CM | POA: Diagnosis not present

## 2018-03-24 DIAGNOSIS — N186 End stage renal disease: Secondary | ICD-10-CM | POA: Diagnosis not present

## 2018-03-24 DIAGNOSIS — E1152 Type 2 diabetes mellitus with diabetic peripheral angiopathy with gangrene: Secondary | ICD-10-CM | POA: Diagnosis not present

## 2018-03-24 DIAGNOSIS — I509 Heart failure, unspecified: Secondary | ICD-10-CM | POA: Diagnosis not present

## 2018-03-25 DIAGNOSIS — D631 Anemia in chronic kidney disease: Secondary | ICD-10-CM | POA: Diagnosis not present

## 2018-03-25 DIAGNOSIS — E785 Hyperlipidemia, unspecified: Secondary | ICD-10-CM | POA: Diagnosis not present

## 2018-03-25 DIAGNOSIS — N2581 Secondary hyperparathyroidism of renal origin: Secondary | ICD-10-CM | POA: Diagnosis not present

## 2018-03-25 DIAGNOSIS — N186 End stage renal disease: Secondary | ICD-10-CM | POA: Diagnosis not present

## 2018-03-25 DIAGNOSIS — E8779 Other fluid overload: Secondary | ICD-10-CM | POA: Diagnosis not present

## 2018-03-27 DIAGNOSIS — I132 Hypertensive heart and chronic kidney disease with heart failure and with stage 5 chronic kidney disease, or end stage renal disease: Secondary | ICD-10-CM | POA: Diagnosis not present

## 2018-03-27 DIAGNOSIS — E1122 Type 2 diabetes mellitus with diabetic chronic kidney disease: Secondary | ICD-10-CM | POA: Diagnosis not present

## 2018-03-27 DIAGNOSIS — N186 End stage renal disease: Secondary | ICD-10-CM | POA: Diagnosis not present

## 2018-03-27 DIAGNOSIS — E1152 Type 2 diabetes mellitus with diabetic peripheral angiopathy with gangrene: Secondary | ICD-10-CM | POA: Diagnosis not present

## 2018-03-27 DIAGNOSIS — I509 Heart failure, unspecified: Secondary | ICD-10-CM | POA: Diagnosis not present

## 2018-03-27 DIAGNOSIS — S81801D Unspecified open wound, right lower leg, subsequent encounter: Secondary | ICD-10-CM | POA: Diagnosis not present

## 2018-03-28 DIAGNOSIS — N2581 Secondary hyperparathyroidism of renal origin: Secondary | ICD-10-CM | POA: Diagnosis not present

## 2018-03-28 DIAGNOSIS — D631 Anemia in chronic kidney disease: Secondary | ICD-10-CM | POA: Diagnosis not present

## 2018-03-28 DIAGNOSIS — E785 Hyperlipidemia, unspecified: Secondary | ICD-10-CM | POA: Diagnosis not present

## 2018-03-28 DIAGNOSIS — N186 End stage renal disease: Secondary | ICD-10-CM | POA: Diagnosis not present

## 2018-03-28 DIAGNOSIS — E8779 Other fluid overload: Secondary | ICD-10-CM | POA: Diagnosis not present

## 2018-03-29 DIAGNOSIS — S81801D Unspecified open wound, right lower leg, subsequent encounter: Secondary | ICD-10-CM | POA: Diagnosis not present

## 2018-03-29 DIAGNOSIS — E1152 Type 2 diabetes mellitus with diabetic peripheral angiopathy with gangrene: Secondary | ICD-10-CM | POA: Diagnosis not present

## 2018-03-29 DIAGNOSIS — N186 End stage renal disease: Secondary | ICD-10-CM | POA: Diagnosis not present

## 2018-03-29 DIAGNOSIS — I509 Heart failure, unspecified: Secondary | ICD-10-CM | POA: Diagnosis not present

## 2018-03-29 DIAGNOSIS — E1122 Type 2 diabetes mellitus with diabetic chronic kidney disease: Secondary | ICD-10-CM | POA: Diagnosis not present

## 2018-03-29 DIAGNOSIS — I132 Hypertensive heart and chronic kidney disease with heart failure and with stage 5 chronic kidney disease, or end stage renal disease: Secondary | ICD-10-CM | POA: Diagnosis not present

## 2018-03-30 DIAGNOSIS — E8779 Other fluid overload: Secondary | ICD-10-CM | POA: Diagnosis not present

## 2018-03-30 DIAGNOSIS — N2581 Secondary hyperparathyroidism of renal origin: Secondary | ICD-10-CM | POA: Diagnosis not present

## 2018-03-30 DIAGNOSIS — N186 End stage renal disease: Secondary | ICD-10-CM | POA: Diagnosis not present

## 2018-03-30 DIAGNOSIS — E785 Hyperlipidemia, unspecified: Secondary | ICD-10-CM | POA: Diagnosis not present

## 2018-03-30 DIAGNOSIS — D631 Anemia in chronic kidney disease: Secondary | ICD-10-CM | POA: Diagnosis not present

## 2018-03-30 DIAGNOSIS — E119 Type 2 diabetes mellitus without complications: Secondary | ICD-10-CM | POA: Diagnosis not present

## 2018-03-31 DIAGNOSIS — E1152 Type 2 diabetes mellitus with diabetic peripheral angiopathy with gangrene: Secondary | ICD-10-CM | POA: Diagnosis not present

## 2018-03-31 DIAGNOSIS — N186 End stage renal disease: Secondary | ICD-10-CM | POA: Diagnosis not present

## 2018-03-31 DIAGNOSIS — I132 Hypertensive heart and chronic kidney disease with heart failure and with stage 5 chronic kidney disease, or end stage renal disease: Secondary | ICD-10-CM | POA: Diagnosis not present

## 2018-03-31 DIAGNOSIS — I509 Heart failure, unspecified: Secondary | ICD-10-CM | POA: Diagnosis not present

## 2018-03-31 DIAGNOSIS — S81801D Unspecified open wound, right lower leg, subsequent encounter: Secondary | ICD-10-CM | POA: Diagnosis not present

## 2018-03-31 DIAGNOSIS — E1122 Type 2 diabetes mellitus with diabetic chronic kidney disease: Secondary | ICD-10-CM | POA: Diagnosis not present

## 2018-04-01 DIAGNOSIS — N2581 Secondary hyperparathyroidism of renal origin: Secondary | ICD-10-CM | POA: Diagnosis not present

## 2018-04-01 DIAGNOSIS — E8779 Other fluid overload: Secondary | ICD-10-CM | POA: Diagnosis not present

## 2018-04-01 DIAGNOSIS — D631 Anemia in chronic kidney disease: Secondary | ICD-10-CM | POA: Diagnosis not present

## 2018-04-01 DIAGNOSIS — E785 Hyperlipidemia, unspecified: Secondary | ICD-10-CM | POA: Diagnosis not present

## 2018-04-01 DIAGNOSIS — N186 End stage renal disease: Secondary | ICD-10-CM | POA: Diagnosis not present

## 2018-04-03 DIAGNOSIS — N186 End stage renal disease: Secondary | ICD-10-CM | POA: Diagnosis not present

## 2018-04-03 DIAGNOSIS — E8779 Other fluid overload: Secondary | ICD-10-CM | POA: Diagnosis not present

## 2018-04-03 DIAGNOSIS — S81801D Unspecified open wound, right lower leg, subsequent encounter: Secondary | ICD-10-CM | POA: Diagnosis not present

## 2018-04-03 DIAGNOSIS — I132 Hypertensive heart and chronic kidney disease with heart failure and with stage 5 chronic kidney disease, or end stage renal disease: Secondary | ICD-10-CM | POA: Diagnosis not present

## 2018-04-03 DIAGNOSIS — E1122 Type 2 diabetes mellitus with diabetic chronic kidney disease: Secondary | ICD-10-CM | POA: Diagnosis not present

## 2018-04-03 DIAGNOSIS — D631 Anemia in chronic kidney disease: Secondary | ICD-10-CM | POA: Diagnosis not present

## 2018-04-03 DIAGNOSIS — E1152 Type 2 diabetes mellitus with diabetic peripheral angiopathy with gangrene: Secondary | ICD-10-CM | POA: Diagnosis not present

## 2018-04-03 DIAGNOSIS — I509 Heart failure, unspecified: Secondary | ICD-10-CM | POA: Diagnosis not present

## 2018-04-03 DIAGNOSIS — E785 Hyperlipidemia, unspecified: Secondary | ICD-10-CM | POA: Diagnosis not present

## 2018-04-03 DIAGNOSIS — N2581 Secondary hyperparathyroidism of renal origin: Secondary | ICD-10-CM | POA: Diagnosis not present

## 2018-04-04 DIAGNOSIS — E8779 Other fluid overload: Secondary | ICD-10-CM | POA: Diagnosis not present

## 2018-04-04 DIAGNOSIS — E785 Hyperlipidemia, unspecified: Secondary | ICD-10-CM | POA: Diagnosis not present

## 2018-04-04 DIAGNOSIS — N186 End stage renal disease: Secondary | ICD-10-CM | POA: Diagnosis not present

## 2018-04-04 DIAGNOSIS — N2581 Secondary hyperparathyroidism of renal origin: Secondary | ICD-10-CM | POA: Diagnosis not present

## 2018-04-04 DIAGNOSIS — D631 Anemia in chronic kidney disease: Secondary | ICD-10-CM | POA: Diagnosis not present

## 2018-04-05 DIAGNOSIS — S81801D Unspecified open wound, right lower leg, subsequent encounter: Secondary | ICD-10-CM | POA: Diagnosis not present

## 2018-04-05 DIAGNOSIS — E1122 Type 2 diabetes mellitus with diabetic chronic kidney disease: Secondary | ICD-10-CM | POA: Diagnosis not present

## 2018-04-05 DIAGNOSIS — I509 Heart failure, unspecified: Secondary | ICD-10-CM | POA: Diagnosis not present

## 2018-04-05 DIAGNOSIS — N186 End stage renal disease: Secondary | ICD-10-CM | POA: Diagnosis not present

## 2018-04-05 DIAGNOSIS — E1152 Type 2 diabetes mellitus with diabetic peripheral angiopathy with gangrene: Secondary | ICD-10-CM | POA: Diagnosis not present

## 2018-04-05 DIAGNOSIS — I132 Hypertensive heart and chronic kidney disease with heart failure and with stage 5 chronic kidney disease, or end stage renal disease: Secondary | ICD-10-CM | POA: Diagnosis not present

## 2018-04-06 DIAGNOSIS — N186 End stage renal disease: Secondary | ICD-10-CM | POA: Diagnosis not present

## 2018-04-06 DIAGNOSIS — N2581 Secondary hyperparathyroidism of renal origin: Secondary | ICD-10-CM | POA: Diagnosis not present

## 2018-04-06 DIAGNOSIS — E785 Hyperlipidemia, unspecified: Secondary | ICD-10-CM | POA: Diagnosis not present

## 2018-04-06 DIAGNOSIS — D631 Anemia in chronic kidney disease: Secondary | ICD-10-CM | POA: Diagnosis not present

## 2018-04-06 DIAGNOSIS — E8779 Other fluid overload: Secondary | ICD-10-CM | POA: Diagnosis not present

## 2018-04-07 DIAGNOSIS — I509 Heart failure, unspecified: Secondary | ICD-10-CM | POA: Diagnosis not present

## 2018-04-07 DIAGNOSIS — S81801D Unspecified open wound, right lower leg, subsequent encounter: Secondary | ICD-10-CM | POA: Diagnosis not present

## 2018-04-07 DIAGNOSIS — E1122 Type 2 diabetes mellitus with diabetic chronic kidney disease: Secondary | ICD-10-CM | POA: Diagnosis not present

## 2018-04-07 DIAGNOSIS — I132 Hypertensive heart and chronic kidney disease with heart failure and with stage 5 chronic kidney disease, or end stage renal disease: Secondary | ICD-10-CM | POA: Diagnosis not present

## 2018-04-07 DIAGNOSIS — E1152 Type 2 diabetes mellitus with diabetic peripheral angiopathy with gangrene: Secondary | ICD-10-CM | POA: Diagnosis not present

## 2018-04-07 DIAGNOSIS — N186 End stage renal disease: Secondary | ICD-10-CM | POA: Diagnosis not present

## 2018-04-08 DIAGNOSIS — D631 Anemia in chronic kidney disease: Secondary | ICD-10-CM | POA: Diagnosis not present

## 2018-04-08 DIAGNOSIS — S81801D Unspecified open wound, right lower leg, subsequent encounter: Secondary | ICD-10-CM | POA: Diagnosis not present

## 2018-04-08 DIAGNOSIS — E1152 Type 2 diabetes mellitus with diabetic peripheral angiopathy with gangrene: Secondary | ICD-10-CM | POA: Diagnosis not present

## 2018-04-08 DIAGNOSIS — N186 End stage renal disease: Secondary | ICD-10-CM | POA: Diagnosis not present

## 2018-04-08 DIAGNOSIS — E785 Hyperlipidemia, unspecified: Secondary | ICD-10-CM | POA: Diagnosis not present

## 2018-04-08 DIAGNOSIS — E1122 Type 2 diabetes mellitus with diabetic chronic kidney disease: Secondary | ICD-10-CM | POA: Diagnosis not present

## 2018-04-08 DIAGNOSIS — I132 Hypertensive heart and chronic kidney disease with heart failure and with stage 5 chronic kidney disease, or end stage renal disease: Secondary | ICD-10-CM | POA: Diagnosis not present

## 2018-04-08 DIAGNOSIS — I509 Heart failure, unspecified: Secondary | ICD-10-CM | POA: Diagnosis not present

## 2018-04-08 DIAGNOSIS — E8779 Other fluid overload: Secondary | ICD-10-CM | POA: Diagnosis not present

## 2018-04-08 DIAGNOSIS — N2581 Secondary hyperparathyroidism of renal origin: Secondary | ICD-10-CM | POA: Diagnosis not present

## 2018-04-09 DIAGNOSIS — I509 Heart failure, unspecified: Secondary | ICD-10-CM | POA: Diagnosis not present

## 2018-04-09 DIAGNOSIS — Z7982 Long term (current) use of aspirin: Secondary | ICD-10-CM | POA: Diagnosis not present

## 2018-04-09 DIAGNOSIS — Z89021 Acquired absence of right finger(s): Secondary | ICD-10-CM | POA: Diagnosis not present

## 2018-04-09 DIAGNOSIS — Z48298 Encounter for aftercare following other organ transplant: Secondary | ICD-10-CM | POA: Diagnosis not present

## 2018-04-09 DIAGNOSIS — E669 Obesity, unspecified: Secondary | ICD-10-CM | POA: Diagnosis not present

## 2018-04-09 DIAGNOSIS — Z940 Kidney transplant status: Secondary | ICD-10-CM | POA: Diagnosis not present

## 2018-04-09 DIAGNOSIS — I132 Hypertensive heart and chronic kidney disease with heart failure and with stage 5 chronic kidney disease, or end stage renal disease: Secondary | ICD-10-CM | POA: Diagnosis not present

## 2018-04-09 DIAGNOSIS — D631 Anemia in chronic kidney disease: Secondary | ICD-10-CM | POA: Diagnosis not present

## 2018-04-09 DIAGNOSIS — E1122 Type 2 diabetes mellitus with diabetic chronic kidney disease: Secondary | ICD-10-CM | POA: Diagnosis not present

## 2018-04-09 DIAGNOSIS — N186 End stage renal disease: Secondary | ICD-10-CM | POA: Diagnosis not present

## 2018-04-09 DIAGNOSIS — Z794 Long term (current) use of insulin: Secondary | ICD-10-CM | POA: Diagnosis not present

## 2018-04-09 DIAGNOSIS — Z6839 Body mass index (BMI) 39.0-39.9, adult: Secondary | ICD-10-CM | POA: Diagnosis not present

## 2018-04-09 DIAGNOSIS — I998 Other disorder of circulatory system: Secondary | ICD-10-CM | POA: Diagnosis not present

## 2018-04-09 DIAGNOSIS — Z9181 History of falling: Secondary | ICD-10-CM | POA: Diagnosis not present

## 2018-04-09 DIAGNOSIS — Z992 Dependence on renal dialysis: Secondary | ICD-10-CM | POA: Diagnosis not present

## 2018-04-09 DIAGNOSIS — Z89022 Acquired absence of left finger(s): Secondary | ICD-10-CM | POA: Diagnosis not present

## 2018-04-09 DIAGNOSIS — E1151 Type 2 diabetes mellitus with diabetic peripheral angiopathy without gangrene: Secondary | ICD-10-CM | POA: Diagnosis not present

## 2018-04-11 DIAGNOSIS — I509 Heart failure, unspecified: Secondary | ICD-10-CM | POA: Diagnosis not present

## 2018-04-11 DIAGNOSIS — N186 End stage renal disease: Secondary | ICD-10-CM | POA: Diagnosis not present

## 2018-04-11 DIAGNOSIS — E1151 Type 2 diabetes mellitus with diabetic peripheral angiopathy without gangrene: Secondary | ICD-10-CM | POA: Diagnosis not present

## 2018-04-11 DIAGNOSIS — E785 Hyperlipidemia, unspecified: Secondary | ICD-10-CM | POA: Diagnosis not present

## 2018-04-11 DIAGNOSIS — E8779 Other fluid overload: Secondary | ICD-10-CM | POA: Diagnosis not present

## 2018-04-11 DIAGNOSIS — D631 Anemia in chronic kidney disease: Secondary | ICD-10-CM | POA: Diagnosis not present

## 2018-04-11 DIAGNOSIS — I132 Hypertensive heart and chronic kidney disease with heart failure and with stage 5 chronic kidney disease, or end stage renal disease: Secondary | ICD-10-CM | POA: Diagnosis not present

## 2018-04-11 DIAGNOSIS — E1122 Type 2 diabetes mellitus with diabetic chronic kidney disease: Secondary | ICD-10-CM | POA: Diagnosis not present

## 2018-04-11 DIAGNOSIS — N2581 Secondary hyperparathyroidism of renal origin: Secondary | ICD-10-CM | POA: Diagnosis not present

## 2018-04-12 DIAGNOSIS — I509 Heart failure, unspecified: Secondary | ICD-10-CM | POA: Diagnosis not present

## 2018-04-12 DIAGNOSIS — I132 Hypertensive heart and chronic kidney disease with heart failure and with stage 5 chronic kidney disease, or end stage renal disease: Secondary | ICD-10-CM | POA: Diagnosis not present

## 2018-04-12 DIAGNOSIS — E1151 Type 2 diabetes mellitus with diabetic peripheral angiopathy without gangrene: Secondary | ICD-10-CM | POA: Diagnosis not present

## 2018-04-12 DIAGNOSIS — D631 Anemia in chronic kidney disease: Secondary | ICD-10-CM | POA: Diagnosis not present

## 2018-04-12 DIAGNOSIS — N186 End stage renal disease: Secondary | ICD-10-CM | POA: Diagnosis not present

## 2018-04-12 DIAGNOSIS — E1122 Type 2 diabetes mellitus with diabetic chronic kidney disease: Secondary | ICD-10-CM | POA: Diagnosis not present

## 2018-04-13 DIAGNOSIS — E1122 Type 2 diabetes mellitus with diabetic chronic kidney disease: Secondary | ICD-10-CM | POA: Diagnosis not present

## 2018-04-13 DIAGNOSIS — E785 Hyperlipidemia, unspecified: Secondary | ICD-10-CM | POA: Diagnosis not present

## 2018-04-13 DIAGNOSIS — D631 Anemia in chronic kidney disease: Secondary | ICD-10-CM | POA: Diagnosis not present

## 2018-04-13 DIAGNOSIS — I132 Hypertensive heart and chronic kidney disease with heart failure and with stage 5 chronic kidney disease, or end stage renal disease: Secondary | ICD-10-CM | POA: Diagnosis not present

## 2018-04-13 DIAGNOSIS — N186 End stage renal disease: Secondary | ICD-10-CM | POA: Diagnosis not present

## 2018-04-13 DIAGNOSIS — Z114 Encounter for screening for human immunodeficiency virus [HIV]: Secondary | ICD-10-CM | POA: Diagnosis not present

## 2018-04-13 DIAGNOSIS — E1151 Type 2 diabetes mellitus with diabetic peripheral angiopathy without gangrene: Secondary | ICD-10-CM | POA: Diagnosis not present

## 2018-04-13 DIAGNOSIS — N2581 Secondary hyperparathyroidism of renal origin: Secondary | ICD-10-CM | POA: Diagnosis not present

## 2018-04-13 DIAGNOSIS — Z1159 Encounter for screening for other viral diseases: Secondary | ICD-10-CM | POA: Diagnosis not present

## 2018-04-13 DIAGNOSIS — E8779 Other fluid overload: Secondary | ICD-10-CM | POA: Diagnosis not present

## 2018-04-13 DIAGNOSIS — I509 Heart failure, unspecified: Secondary | ICD-10-CM | POA: Diagnosis not present

## 2018-04-14 DIAGNOSIS — N186 End stage renal disease: Secondary | ICD-10-CM | POA: Diagnosis not present

## 2018-04-14 DIAGNOSIS — E1122 Type 2 diabetes mellitus with diabetic chronic kidney disease: Secondary | ICD-10-CM | POA: Diagnosis not present

## 2018-04-14 DIAGNOSIS — X58XXXD Exposure to other specified factors, subsequent encounter: Secondary | ICD-10-CM | POA: Diagnosis not present

## 2018-04-14 DIAGNOSIS — E1151 Type 2 diabetes mellitus with diabetic peripheral angiopathy without gangrene: Secondary | ICD-10-CM | POA: Diagnosis not present

## 2018-04-14 DIAGNOSIS — D631 Anemia in chronic kidney disease: Secondary | ICD-10-CM | POA: Diagnosis not present

## 2018-04-14 DIAGNOSIS — I132 Hypertensive heart and chronic kidney disease with heart failure and with stage 5 chronic kidney disease, or end stage renal disease: Secondary | ICD-10-CM | POA: Diagnosis not present

## 2018-04-14 DIAGNOSIS — I509 Heart failure, unspecified: Secondary | ICD-10-CM | POA: Diagnosis not present

## 2018-04-14 DIAGNOSIS — S81801D Unspecified open wound, right lower leg, subsequent encounter: Secondary | ICD-10-CM | POA: Diagnosis not present

## 2018-04-15 DIAGNOSIS — E1151 Type 2 diabetes mellitus with diabetic peripheral angiopathy without gangrene: Secondary | ICD-10-CM | POA: Diagnosis not present

## 2018-04-15 DIAGNOSIS — E8779 Other fluid overload: Secondary | ICD-10-CM | POA: Diagnosis not present

## 2018-04-15 DIAGNOSIS — D631 Anemia in chronic kidney disease: Secondary | ICD-10-CM | POA: Diagnosis not present

## 2018-04-15 DIAGNOSIS — E785 Hyperlipidemia, unspecified: Secondary | ICD-10-CM | POA: Diagnosis not present

## 2018-04-15 DIAGNOSIS — I509 Heart failure, unspecified: Secondary | ICD-10-CM | POA: Diagnosis not present

## 2018-04-15 DIAGNOSIS — N186 End stage renal disease: Secondary | ICD-10-CM | POA: Diagnosis not present

## 2018-04-15 DIAGNOSIS — I132 Hypertensive heart and chronic kidney disease with heart failure and with stage 5 chronic kidney disease, or end stage renal disease: Secondary | ICD-10-CM | POA: Diagnosis not present

## 2018-04-15 DIAGNOSIS — N2581 Secondary hyperparathyroidism of renal origin: Secondary | ICD-10-CM | POA: Diagnosis not present

## 2018-04-15 DIAGNOSIS — E1122 Type 2 diabetes mellitus with diabetic chronic kidney disease: Secondary | ICD-10-CM | POA: Diagnosis not present

## 2018-04-16 DIAGNOSIS — I132 Hypertensive heart and chronic kidney disease with heart failure and with stage 5 chronic kidney disease, or end stage renal disease: Secondary | ICD-10-CM | POA: Diagnosis not present

## 2018-04-16 DIAGNOSIS — E1151 Type 2 diabetes mellitus with diabetic peripheral angiopathy without gangrene: Secondary | ICD-10-CM | POA: Diagnosis not present

## 2018-04-16 DIAGNOSIS — N186 End stage renal disease: Secondary | ICD-10-CM | POA: Diagnosis not present

## 2018-04-16 DIAGNOSIS — D631 Anemia in chronic kidney disease: Secondary | ICD-10-CM | POA: Diagnosis not present

## 2018-04-16 DIAGNOSIS — E1122 Type 2 diabetes mellitus with diabetic chronic kidney disease: Secondary | ICD-10-CM | POA: Diagnosis not present

## 2018-04-16 DIAGNOSIS — I509 Heart failure, unspecified: Secondary | ICD-10-CM | POA: Diagnosis not present

## 2018-04-17 DIAGNOSIS — I132 Hypertensive heart and chronic kidney disease with heart failure and with stage 5 chronic kidney disease, or end stage renal disease: Secondary | ICD-10-CM | POA: Diagnosis not present

## 2018-04-17 DIAGNOSIS — N186 End stage renal disease: Secondary | ICD-10-CM | POA: Diagnosis not present

## 2018-04-17 DIAGNOSIS — E1151 Type 2 diabetes mellitus with diabetic peripheral angiopathy without gangrene: Secondary | ICD-10-CM | POA: Diagnosis not present

## 2018-04-17 DIAGNOSIS — D631 Anemia in chronic kidney disease: Secondary | ICD-10-CM | POA: Diagnosis not present

## 2018-04-17 DIAGNOSIS — I509 Heart failure, unspecified: Secondary | ICD-10-CM | POA: Diagnosis not present

## 2018-04-17 DIAGNOSIS — E1122 Type 2 diabetes mellitus with diabetic chronic kidney disease: Secondary | ICD-10-CM | POA: Diagnosis not present

## 2018-04-18 DIAGNOSIS — N2581 Secondary hyperparathyroidism of renal origin: Secondary | ICD-10-CM | POA: Diagnosis not present

## 2018-04-18 DIAGNOSIS — E1122 Type 2 diabetes mellitus with diabetic chronic kidney disease: Secondary | ICD-10-CM | POA: Diagnosis not present

## 2018-04-18 DIAGNOSIS — E1151 Type 2 diabetes mellitus with diabetic peripheral angiopathy without gangrene: Secondary | ICD-10-CM | POA: Diagnosis not present

## 2018-04-18 DIAGNOSIS — I509 Heart failure, unspecified: Secondary | ICD-10-CM | POA: Diagnosis not present

## 2018-04-18 DIAGNOSIS — E785 Hyperlipidemia, unspecified: Secondary | ICD-10-CM | POA: Diagnosis not present

## 2018-04-18 DIAGNOSIS — N186 End stage renal disease: Secondary | ICD-10-CM | POA: Diagnosis not present

## 2018-04-18 DIAGNOSIS — E8779 Other fluid overload: Secondary | ICD-10-CM | POA: Diagnosis not present

## 2018-04-18 DIAGNOSIS — D631 Anemia in chronic kidney disease: Secondary | ICD-10-CM | POA: Diagnosis not present

## 2018-04-18 DIAGNOSIS — I132 Hypertensive heart and chronic kidney disease with heart failure and with stage 5 chronic kidney disease, or end stage renal disease: Secondary | ICD-10-CM | POA: Diagnosis not present

## 2018-04-19 DIAGNOSIS — I132 Hypertensive heart and chronic kidney disease with heart failure and with stage 5 chronic kidney disease, or end stage renal disease: Secondary | ICD-10-CM | POA: Diagnosis not present

## 2018-04-19 DIAGNOSIS — D631 Anemia in chronic kidney disease: Secondary | ICD-10-CM | POA: Diagnosis not present

## 2018-04-19 DIAGNOSIS — N186 End stage renal disease: Secondary | ICD-10-CM | POA: Diagnosis not present

## 2018-04-19 DIAGNOSIS — I509 Heart failure, unspecified: Secondary | ICD-10-CM | POA: Diagnosis not present

## 2018-04-19 DIAGNOSIS — E1151 Type 2 diabetes mellitus with diabetic peripheral angiopathy without gangrene: Secondary | ICD-10-CM | POA: Diagnosis not present

## 2018-04-19 DIAGNOSIS — E1122 Type 2 diabetes mellitus with diabetic chronic kidney disease: Secondary | ICD-10-CM | POA: Diagnosis not present

## 2018-04-20 DIAGNOSIS — D631 Anemia in chronic kidney disease: Secondary | ICD-10-CM | POA: Diagnosis not present

## 2018-04-20 DIAGNOSIS — I509 Heart failure, unspecified: Secondary | ICD-10-CM | POA: Diagnosis not present

## 2018-04-20 DIAGNOSIS — N2581 Secondary hyperparathyroidism of renal origin: Secondary | ICD-10-CM | POA: Diagnosis not present

## 2018-04-20 DIAGNOSIS — E1151 Type 2 diabetes mellitus with diabetic peripheral angiopathy without gangrene: Secondary | ICD-10-CM | POA: Diagnosis not present

## 2018-04-20 DIAGNOSIS — E1122 Type 2 diabetes mellitus with diabetic chronic kidney disease: Secondary | ICD-10-CM | POA: Diagnosis not present

## 2018-04-20 DIAGNOSIS — E8779 Other fluid overload: Secondary | ICD-10-CM | POA: Diagnosis not present

## 2018-04-20 DIAGNOSIS — N186 End stage renal disease: Secondary | ICD-10-CM | POA: Diagnosis not present

## 2018-04-20 DIAGNOSIS — E785 Hyperlipidemia, unspecified: Secondary | ICD-10-CM | POA: Diagnosis not present

## 2018-04-20 DIAGNOSIS — I132 Hypertensive heart and chronic kidney disease with heart failure and with stage 5 chronic kidney disease, or end stage renal disease: Secondary | ICD-10-CM | POA: Diagnosis not present

## 2018-04-21 DIAGNOSIS — D631 Anemia in chronic kidney disease: Secondary | ICD-10-CM | POA: Diagnosis not present

## 2018-04-21 DIAGNOSIS — E1122 Type 2 diabetes mellitus with diabetic chronic kidney disease: Secondary | ICD-10-CM | POA: Diagnosis not present

## 2018-04-21 DIAGNOSIS — I509 Heart failure, unspecified: Secondary | ICD-10-CM | POA: Diagnosis not present

## 2018-04-21 DIAGNOSIS — N186 End stage renal disease: Secondary | ICD-10-CM | POA: Diagnosis not present

## 2018-04-21 DIAGNOSIS — Z992 Dependence on renal dialysis: Secondary | ICD-10-CM | POA: Diagnosis not present

## 2018-04-21 DIAGNOSIS — E1151 Type 2 diabetes mellitus with diabetic peripheral angiopathy without gangrene: Secondary | ICD-10-CM | POA: Diagnosis not present

## 2018-04-21 DIAGNOSIS — I132 Hypertensive heart and chronic kidney disease with heart failure and with stage 5 chronic kidney disease, or end stage renal disease: Secondary | ICD-10-CM | POA: Diagnosis not present

## 2018-04-22 DIAGNOSIS — E1151 Type 2 diabetes mellitus with diabetic peripheral angiopathy without gangrene: Secondary | ICD-10-CM | POA: Diagnosis not present

## 2018-04-22 DIAGNOSIS — N186 End stage renal disease: Secondary | ICD-10-CM | POA: Diagnosis not present

## 2018-04-22 DIAGNOSIS — E1122 Type 2 diabetes mellitus with diabetic chronic kidney disease: Secondary | ICD-10-CM | POA: Diagnosis not present

## 2018-04-22 DIAGNOSIS — N2581 Secondary hyperparathyroidism of renal origin: Secondary | ICD-10-CM | POA: Diagnosis not present

## 2018-04-22 DIAGNOSIS — I509 Heart failure, unspecified: Secondary | ICD-10-CM | POA: Diagnosis not present

## 2018-04-22 DIAGNOSIS — D631 Anemia in chronic kidney disease: Secondary | ICD-10-CM | POA: Diagnosis not present

## 2018-04-22 DIAGNOSIS — I132 Hypertensive heart and chronic kidney disease with heart failure and with stage 5 chronic kidney disease, or end stage renal disease: Secondary | ICD-10-CM | POA: Diagnosis not present

## 2018-04-23 DIAGNOSIS — E1151 Type 2 diabetes mellitus with diabetic peripheral angiopathy without gangrene: Secondary | ICD-10-CM | POA: Diagnosis not present

## 2018-04-23 DIAGNOSIS — I509 Heart failure, unspecified: Secondary | ICD-10-CM | POA: Diagnosis not present

## 2018-04-23 DIAGNOSIS — N186 End stage renal disease: Secondary | ICD-10-CM | POA: Diagnosis not present

## 2018-04-23 DIAGNOSIS — I132 Hypertensive heart and chronic kidney disease with heart failure and with stage 5 chronic kidney disease, or end stage renal disease: Secondary | ICD-10-CM | POA: Diagnosis not present

## 2018-04-23 DIAGNOSIS — E1122 Type 2 diabetes mellitus with diabetic chronic kidney disease: Secondary | ICD-10-CM | POA: Diagnosis not present

## 2018-04-23 DIAGNOSIS — D631 Anemia in chronic kidney disease: Secondary | ICD-10-CM | POA: Diagnosis not present

## 2018-04-24 ENCOUNTER — Emergency Department (HOSPITAL_COMMUNITY)
Admission: EM | Admit: 2018-04-24 | Discharge: 2018-04-24 | Disposition: A | Discharge status: Home / Self Care | Payer: Medicare Other | Attending: Emergency Medicine | Admitting: Emergency Medicine

## 2018-04-24 ENCOUNTER — Emergency Department (HOSPITAL_COMMUNITY): Payer: Medicare Other

## 2018-04-24 DIAGNOSIS — I5040 Unspecified combined systolic (congestive) and diastolic (congestive) heart failure: Secondary | ICD-10-CM | POA: Diagnosis not present

## 2018-04-24 DIAGNOSIS — S91202A Unspecified open wound of left great toe with damage to nail, initial encounter: Secondary | ICD-10-CM | POA: Diagnosis not present

## 2018-04-24 DIAGNOSIS — S91209A Unspecified open wound of unspecified toe(s) with damage to nail, initial encounter: Secondary | ICD-10-CM | POA: Insufficient documentation

## 2018-04-24 DIAGNOSIS — Z794 Long term (current) use of insulin: Secondary | ICD-10-CM | POA: Diagnosis not present

## 2018-04-24 DIAGNOSIS — Y9301 Activity, walking, marching and hiking: Secondary | ICD-10-CM | POA: Diagnosis not present

## 2018-04-24 DIAGNOSIS — D631 Anemia in chronic kidney disease: Secondary | ICD-10-CM | POA: Diagnosis not present

## 2018-04-24 DIAGNOSIS — I132 Hypertensive heart and chronic kidney disease with heart failure and with stage 5 chronic kidney disease, or end stage renal disease: Secondary | ICD-10-CM | POA: Insufficient documentation

## 2018-04-24 DIAGNOSIS — Y999 Unspecified external cause status: Secondary | ICD-10-CM | POA: Insufficient documentation

## 2018-04-24 DIAGNOSIS — N2581 Secondary hyperparathyroidism of renal origin: Secondary | ICD-10-CM | POA: Diagnosis not present

## 2018-04-24 DIAGNOSIS — Y929 Unspecified place or not applicable: Secondary | ICD-10-CM | POA: Diagnosis not present

## 2018-04-24 DIAGNOSIS — Z7982 Long term (current) use of aspirin: Secondary | ICD-10-CM | POA: Diagnosis not present

## 2018-04-24 DIAGNOSIS — N186 End stage renal disease: Secondary | ICD-10-CM | POA: Diagnosis not present

## 2018-04-24 DIAGNOSIS — Z79899 Other long term (current) drug therapy: Secondary | ICD-10-CM | POA: Insufficient documentation

## 2018-04-24 DIAGNOSIS — W2203XA Walked into furniture, initial encounter: Secondary | ICD-10-CM | POA: Insufficient documentation

## 2018-04-24 DIAGNOSIS — E1122 Type 2 diabetes mellitus with diabetic chronic kidney disease: Secondary | ICD-10-CM | POA: Diagnosis not present

## 2018-04-24 DIAGNOSIS — M79674 Pain in right toe(s): Secondary | ICD-10-CM | POA: Diagnosis present

## 2018-04-24 DIAGNOSIS — I509 Heart failure, unspecified: Secondary | ICD-10-CM | POA: Diagnosis not present

## 2018-04-24 DIAGNOSIS — E1151 Type 2 diabetes mellitus with diabetic peripheral angiopathy without gangrene: Secondary | ICD-10-CM | POA: Diagnosis not present

## 2018-04-24 DIAGNOSIS — S92425A Nondisplaced fracture of distal phalanx of left great toe, initial encounter for closed fracture: Secondary | ICD-10-CM | POA: Insufficient documentation

## 2018-04-24 MED ORDER — HYDROCODONE-ACETAMINOPHEN 5-325 MG PO TABS
1.0000 | ORAL_TABLET | Freq: Once | ORAL | Status: AC
  Administered 2018-04-24: 1 via ORAL
  Filled 2018-04-24: qty 1

## 2018-04-24 MED ORDER — HYDROCODONE-ACETAMINOPHEN 5-325 MG PO TABS: 1.0000 | ORAL_TABLET | ORAL | 0 refills | Status: DC | PRN

## 2018-04-24 NOTE — ED Provider Notes (Signed)
TIME SEEN: 2:43 AM  CHIEF COMPLAINT: Left great toe injury  HPI: Patient is a 53 year old male with history of diabetes, hypertension, cardiomyopathy, end-stage renal disease on dialysis who presents to the emergency department with a left great toe injury.  States he stubbed his right great toe on a coffee table just prior to arrival.  The nail was pushed backward.  No other injury.  No numbness or weakness.  Able to ambulate.  ROS: See HPI Constitutional: no fever  Eyes: no drainage  ENT: no runny nose   Cardiovascular:  no chest pain  Resp: no SOB  GI: no vomiting GU: no dysuria Integumentary: no rash  Allergy: no hives  Musculoskeletal: no leg swelling  Neurological: no slurred speech ROS otherwise negative  PAST MEDICAL HISTORY/PAST SURGICAL HISTORY:  Past Medical History:  Diagnosis Date  . Anal infection    posterior anal canal  . Anemia, chronic renal failure   . Atrophic kidney    BILATERAL  . DM type 2 causing ESRD [facility])    Nephrologist-- dr [name] [facility])--  on hemodialysis since June 2012 at  [name]  TTS  . Hemodialysis patient [facility])    at [place] on Tues/ Thur/Sat/schedule  . Hemorrhoids   . Hepatitis B antibody positive   . History of pleural effusion    bilateral  . Hyperparathyroidism, secondary renal ([place])   . Hypertension   . Ischemic cardiomyopathy    per echo 07-01-2014  ef 45%  . LAFB (left anterior fascicular block)   . Peripheral neuropathy   . Systolic and diastolic CHF, chronic ([place])    CARDIOLOGIST-  DR Daneen Schick ([place])  AND DR [name] ([name])    MEDICATIONS:  Prior to Admission medications   Medication Sig Start Date End Date Taking? Authorizing Provider  aspirin 325 MG tablet Take 325 mg by mouth daily.    [provider]  calcium acetate (PHOSLO) 667 MG capsule Take 2,001 mg by mouth 3 (three) times daily with meals.  06/02/10   [provider]   cinacalcet (SENSIPAR) 60 MG tablet Take 60 mg by mouth daily.    [provider]  cycloSPORINE (RESTASIS) 0.05 % ophthalmic emulsion Place 1 drop into both eyes 2 (two) times daily. 04/22/14   [provider]  febuxostat (ULORIC) 40 MG tablet Take 40 mg by mouth daily.     [provider]  gabapentin (NEURONTIN) 300 MG capsule Take 300 mg by mouth 3 (three) times daily as needed. For pain 05/09/15   [provider]  Insulin Aspart Prot & Aspart (NOVOLOG MIX 70/30 PENFILL [place]) Inject 14-16 Units into the skin See admin instructions. Inject 16 units into the skin each morning and 14 units into the skin each evening before meals. Adjusts dose if CBG greater than 150    [provider]  lidocaine (XYLOCAINE) 5 % ointment Apply 1 application topically 3 (three) times daily as needed. (Finger Pain) 09/27/17   [provider]  LIPITOR 10 MG tablet Take 10 mg by mouth daily. 01/08/16   [provider]  midodrine (PROAMATINE) 5 MG tablet Take 5 mg by mouth Every Tuesday,Thursday,and Saturday with dialysis.  10/06/17   [provider]  Multiple Vitamins-Minerals (MEGA MULTIVITAMIN FOR MEN PO) Take 1 tablet by mouth daily.     [provider]  sevelamer carbonate (RENVELA) 800 MG tablet Take 2,400 mg by mouth 3 (three) times daily with meals.  [provider]    ALLERGIES:  No Known Allergies  SOCIAL HISTORY:  Social History   Tobacco Use  . Smoking status: Never Smoker  . Smokeless tobacco: Never Used  Substance Use Topics  . Alcohol use: No    FAMILY HISTORY: Family History  Family history unknown: Yes    EXAM: BP (!) 120/45 (BP Location: Left Arm)   Pulse 95   Temp 97.8 F (36.6 C) (Oral)   Resp 18   SpO2 96%  CONSTITUTIONAL: Alert and oriented and responds appropriately to questions. Well-appearing; well-nourished HEAD: Normocephalic EYES: Conjunctivae clear, pupils appear equal ENT: normal nose;  moist mucous membranes NECK: Supple CARD: RRR RESP: Normal chest excursion without splinting or tachypnea; no hypoxia or respiratory distress, speaking full sentences ABD/GI: non-distended BACK:  The back appears normal EXT: Normal ROM in all joints, tender to palpation over the left great toe without deformity, 2+ right DP pulse, no joint effusion, compartments in the left leg soft, left great toe nail is separated from the nailbed distally but still attached proximally without laceration SKIN: Normal color for age and race NEURO: Moves all extremities equally, normal sensation in his right lower extremity PSYCH: The patient's mood and manner are appropriate. Grooming and personal hygiene are appropriate.  MEDICAL DECISION MAKING: Patient here with left great toe injury.  His nail is partially avulsed but still attached proximally.  Will leave the nail in place in order to protect the nailbed.  No laceration at this time.  Neurovascularly intact distally.  Will obtain x-rays.  Will give Vicodin for pain.  ED PROGRESS: Patient does have a small fracture to the base of the distal left first toe.  Fracture nondisplaced.  Will place in postop shoe.  He is able to ambulate.  Will discharge with short course of Vicodin for pain control.  It does appear that he receives frequent pain medication but is not receiving it from a regular physician and is not on a pain contract.  Last narcotic prescription was January 17.  Will give outpatient orthopedic follow-up as needed but discussed with patient that this should heal on its own without intervention.  SPLINT APPLICATION Date/Time: 6:65 AM Authorized by: Cyril Mourning Mirage Pfefferkorn Consent: Verbal consent obtained. Risks and benefits: risks, benefits and alternatives were discussed Consent given by: patient Splint applied by:  technician Location details: left foot Splint type: post op shoe Supplies used: post op shoe Post-procedure: The splinted body part was  neurovascularly unchanged following the procedure. Patient tolerance: Patient tolerated the procedure well with no immediate complications.        Shaquia Berkley, Delice Bison, DO 04/24/18 [phone]

## 2018-04-24 NOTE — ED Triage Notes (Signed)
Pt reports R big toe pain after hitting foot on coffee table. Nail is almost off.

## 2018-04-25 DIAGNOSIS — N186 End stage renal disease: Secondary | ICD-10-CM | POA: Diagnosis not present

## 2018-04-25 DIAGNOSIS — N2581 Secondary hyperparathyroidism of renal origin: Secondary | ICD-10-CM | POA: Diagnosis not present

## 2018-04-25 DIAGNOSIS — D631 Anemia in chronic kidney disease: Secondary | ICD-10-CM | POA: Diagnosis not present

## 2018-04-26 DIAGNOSIS — N186 End stage renal disease: Secondary | ICD-10-CM | POA: Diagnosis not present

## 2018-04-26 DIAGNOSIS — I132 Hypertensive heart and chronic kidney disease with heart failure and with stage 5 chronic kidney disease, or end stage renal disease: Secondary | ICD-10-CM | POA: Diagnosis not present

## 2018-04-26 DIAGNOSIS — I509 Heart failure, unspecified: Secondary | ICD-10-CM | POA: Diagnosis not present

## 2018-04-26 DIAGNOSIS — D631 Anemia in chronic kidney disease: Secondary | ICD-10-CM | POA: Diagnosis not present

## 2018-04-26 DIAGNOSIS — E1151 Type 2 diabetes mellitus with diabetic peripheral angiopathy without gangrene: Secondary | ICD-10-CM | POA: Diagnosis not present

## 2018-04-26 DIAGNOSIS — E1122 Type 2 diabetes mellitus with diabetic chronic kidney disease: Secondary | ICD-10-CM | POA: Diagnosis not present

## 2018-04-27 DIAGNOSIS — E119 Type 2 diabetes mellitus without complications: Secondary | ICD-10-CM | POA: Diagnosis not present

## 2018-04-27 DIAGNOSIS — N2581 Secondary hyperparathyroidism of renal origin: Secondary | ICD-10-CM | POA: Diagnosis not present

## 2018-04-27 DIAGNOSIS — N186 End stage renal disease: Secondary | ICD-10-CM | POA: Diagnosis not present

## 2018-04-27 DIAGNOSIS — D631 Anemia in chronic kidney disease: Secondary | ICD-10-CM | POA: Diagnosis not present

## 2018-04-28 DIAGNOSIS — I132 Hypertensive heart and chronic kidney disease with heart failure and with stage 5 chronic kidney disease, or end stage renal disease: Secondary | ICD-10-CM | POA: Diagnosis not present

## 2018-04-28 DIAGNOSIS — E1122 Type 2 diabetes mellitus with diabetic chronic kidney disease: Secondary | ICD-10-CM | POA: Diagnosis not present

## 2018-04-28 DIAGNOSIS — D631 Anemia in chronic kidney disease: Secondary | ICD-10-CM | POA: Diagnosis not present

## 2018-04-28 DIAGNOSIS — E1151 Type 2 diabetes mellitus with diabetic peripheral angiopathy without gangrene: Secondary | ICD-10-CM | POA: Diagnosis not present

## 2018-04-28 DIAGNOSIS — N186 End stage renal disease: Secondary | ICD-10-CM | POA: Diagnosis not present

## 2018-04-28 DIAGNOSIS — I509 Heart failure, unspecified: Secondary | ICD-10-CM | POA: Diagnosis not present

## 2018-04-29 DIAGNOSIS — N186 End stage renal disease: Secondary | ICD-10-CM | POA: Diagnosis not present

## 2018-04-29 DIAGNOSIS — D631 Anemia in chronic kidney disease: Secondary | ICD-10-CM | POA: Diagnosis not present

## 2018-04-29 DIAGNOSIS — N2581 Secondary hyperparathyroidism of renal origin: Secondary | ICD-10-CM | POA: Diagnosis not present

## 2018-05-01 ENCOUNTER — Ambulatory Visit: Payer: Medicare Other | Admitting: Interventional Cardiology

## 2018-05-01 DIAGNOSIS — E1122 Type 2 diabetes mellitus with diabetic chronic kidney disease: Secondary | ICD-10-CM | POA: Diagnosis not present

## 2018-05-01 DIAGNOSIS — I509 Heart failure, unspecified: Secondary | ICD-10-CM | POA: Diagnosis not present

## 2018-05-01 DIAGNOSIS — N186 End stage renal disease: Secondary | ICD-10-CM | POA: Diagnosis not present

## 2018-05-01 DIAGNOSIS — E1165 Type 2 diabetes mellitus with hyperglycemia: Secondary | ICD-10-CM | POA: Diagnosis not present

## 2018-05-01 DIAGNOSIS — D631 Anemia in chronic kidney disease: Secondary | ICD-10-CM | POA: Diagnosis not present

## 2018-05-01 DIAGNOSIS — E1151 Type 2 diabetes mellitus with diabetic peripheral angiopathy without gangrene: Secondary | ICD-10-CM | POA: Diagnosis not present

## 2018-05-01 DIAGNOSIS — Z79899 Other long term (current) drug therapy: Secondary | ICD-10-CM | POA: Diagnosis not present

## 2018-05-01 DIAGNOSIS — E7800 Pure hypercholesterolemia, unspecified: Secondary | ICD-10-CM | POA: Diagnosis not present

## 2018-05-01 DIAGNOSIS — Z79891 Long term (current) use of opiate analgesic: Secondary | ICD-10-CM | POA: Diagnosis not present

## 2018-05-01 DIAGNOSIS — I132 Hypertensive heart and chronic kidney disease with heart failure and with stage 5 chronic kidney disease, or end stage renal disease: Secondary | ICD-10-CM | POA: Diagnosis not present

## 2018-05-01 NOTE — Progress Notes (Deleted)
Cardiology Office Note:    Date:  05/01/2018   ID:  [name] [name], DOB [birth year], MRN [id number]  PCP:  [name] Liberty, MD  Cardiologist:  Sinclair Grooms, MD   Referring MD: [name] Liberty, MD   No chief complaint on file.   History of Present Illness:    [name] [name] is a 53 y.o. male with a hx of  non-ischemic cardiomyopathy, ESRD, DM II, and obesity. He has 2 sets of specialist. He has a nephrologist, [name]. Brayton El in [facility]. He is also is being seen by nephrology at [facility] and is on the list for transplantation.[name] [name] He also sees a cardiologist at [facility], [name]. [name].    Past Medical History:  Diagnosis Date  . Anal infection    posterior anal canal  . Anemia, chronic renal failure   . Atrophic kidney    BILATERAL  . DM type 2 causing ESRD [name] [name])    Nephrologist-- [name] [name] [facility])--  on hemodialysis since June 2012 at  [name]  TTS  . Hemodialysis patient [name] [name])    at [place] on Tues/ Thur/Sat/schedule  . Hemorrhoids   . Hepatitis B antibody positive   . History of pleural effusion    bilateral  . Hyperparathyroidism, secondary renal ([place])   . Hypertension   . Ischemic cardiomyopathy    per echo 07-01-2014  ef 45%  . LAFB (left anterior fascicular block)   . Peripheral neuropathy   . Systolic and diastolic CHF, chronic ([place])    CARDIOLOGIST-  [name] Daneen Schick ([place])  AND [name] [name] ([name])    Past Surgical History:  Procedure Laterality Date  . APPENDECTOMY  09-12-2004   laparotomy w/ drainage peritinitis  . AV FISTULA PLACEMENT  02-27-2010   right forearm (RADIOCEPHALIC)  . AV FISTULA REPAIR  10-30-2010  . CARDIOVASCULAR STRESS TEST  10-29-2011   [name] Daneen Schick   Low risk scan;  mild perfusion defect seen in the basal inferoseptal, basal inferior and mid inferior regions consistent with an infarct/scar and/or overlying attenuation/   mild to moderate global LVSF,  ef 40-45%  . DOBUTAMINE STRESS ECHO  07-23-2012   [name]   abnormal ;  at rest estimated lvef 25-30% and global severe LV hypokinesis ;  no cp during stress and achieved 85% maxium predicted heart rate;  negative stress ECG for inducible ischemia;  estimated lvef with stress 35-40%;  augmentation of wall segments consistant with cardiomyopathy and differential fibrosis  . FISTULOTOMY N/A 03/26/2015   Procedure: FISTULOTOMY;  Surgeon: [name], MD;  Location: [facility];  Service: General;  Laterality: N/A;  . INCISION AND DRAINAGE ABSCESS N/A 03/26/2015   Procedure: ANAL INCISION AND DRAINAGE;  Surgeon: [name], MD;  Location: [place];  Service: General;  Laterality: N/A;  . RETINAL DETACHMENT SURGERY Left 2011   incomplete repair/ needs eye drops to keep pressure down  . TEE WITHOUT CARDIOVERSION N/A 10/17/2017   Procedure: TRANSESOPHAGEAL ECHOCARDIOGRAM (TEE);  Surgeon: Josue Hector, MD;  Location: [facility] ENDOSCOPY;  Service: Cardiovascular;  Laterality: N/A;  . TRANSTHORACIC ECHOCARDIOGRAM  07-01-2014    done at [facility]   grade 1 diastolic dysfunction,  ef 45%/  trace TR and PR    Current Medications: No outpatient medications have been marked as taking for the 05/01/18 encounter (Appointment) with [name] Crome, MD.     Allergies:  Patient has no known allergies.   Social History   Socioeconomic History  . Marital status: Single    Spouse name: Not on file  . Number of children: Not on file  . Years of education: Not on file  . Highest education level: Not on file  Occupational History  . Not on file  Social Needs  . Financial resource strain: Not on file  . Food insecurity:    Worry: Not on file    Inability: Not on file  . Transportation needs:    Medical: Not on file    Non-medical: Not on file  Tobacco Use  . Smoking status: Never Smoker  . Smokeless tobacco: Never Used    Substance and Sexual Activity  . Alcohol use: No  . Drug use: No  . Sexual activity: Not on file  Lifestyle  . Physical activity:    Days per week: Not on file    Minutes per session: Not on file  . Stress: Not on file  Relationships  . Social connections:    Talks on phone: Not on file    Gets together: Not on file    Attends religious service: Not on file    Active member of club or organization: Not on file    Attends meetings of clubs or organizations: Not on file    Relationship status: Not on file  Other Topics Concern  . Not on file  Social History Narrative  . Not on file     Family History: The patient's Family history is unknown by patient.  ROS:   Please see the history of present illness.    *** All other systems reviewed and are negative.  EKGs/Labs/Other Studies Reviewed:    The following studies were reviewed today: ***  EKG:  EKG ***  Recent Labs: 10/13/2017: ALT 14; TSH 0.224 10/14/2017: BUN 75; Creatinine, Ser 10.73; Hemoglobin 13.3; Platelets 262; Potassium 5.4; Sodium 139  Recent Lipid Panel No results found for: CHOL, TRIG, HDL, CHOLHDL, VLDL, LDLCALC, LDLDIRECT  Physical Exam:    VS:  There were no vitals taken for this visit.    Wt Readings from Last 3 Encounters:  10/17/17 237 lb 7 oz (107.7 kg)  10/14/17 237 lb 7 oz (107.7 kg)  10/12/17 243 lb (110.2 kg)     GEN: ***. No acute distress HEENT: Normal NECK: No JVD. LYMPHATICS: No lymphadenopathy CARDIAC: ***RRR.  *** murmur, ***gallop, ***edema VASCULAR: *** Pulses, *** bruits RESPIRATORY:  Clear to auscultation without rales, wheezing or rhonchi  ABDOMEN: Soft, non-tender, non-distended, No pulsatile mass, MUSCULOSKELETAL: No deformity  SKIN: Warm and dry NEUROLOGIC:  Alert and oriented x 3 PSYCHIATRIC:  Normal affect   ASSESSMENT:    1. Chronic systolic heart failure ([place])   2. Non-ischemic cardiomyopathy ([place])   3. Vasculitis ([place])   4. HYPERCHOLESTEROLEMIA   5.  Essential hypertension   6. Uncontrolled type 2 diabetes mellitus with hyperglycemia ([place])   7. CKD (chronic kidney disease) stage V requiring chronic dialysis ([name])    PLAN:    In order of problems listed above:  1. ***   Medication Adjustments/Labs and Tests Ordered: Current medicines are reviewed at length with the patient today.  Concerns regarding medicines are outlined above.  No orders of the defined types were placed in this encounter.  No orders of the defined types were placed in this encounter.   There are no Patient Instructions on file for this visit.   Signed, [name] [name]  III, MD  05/01/2018 8:56 AM    [facility]

## 2018-05-02 ENCOUNTER — Encounter: Payer: Self-pay | Admitting: Interventional Cardiology

## 2018-05-02 DIAGNOSIS — N2581 Secondary hyperparathyroidism of renal origin: Secondary | ICD-10-CM | POA: Diagnosis not present

## 2018-05-02 DIAGNOSIS — D631 Anemia in chronic kidney disease: Secondary | ICD-10-CM | POA: Diagnosis not present

## 2018-05-02 DIAGNOSIS — N186 End stage renal disease: Secondary | ICD-10-CM | POA: Diagnosis not present

## 2018-05-03 DIAGNOSIS — I509 Heart failure, unspecified: Secondary | ICD-10-CM | POA: Diagnosis not present

## 2018-05-03 DIAGNOSIS — E1151 Type 2 diabetes mellitus with diabetic peripheral angiopathy without gangrene: Secondary | ICD-10-CM | POA: Diagnosis not present

## 2018-05-03 DIAGNOSIS — N186 End stage renal disease: Secondary | ICD-10-CM | POA: Diagnosis not present

## 2018-05-03 DIAGNOSIS — E1122 Type 2 diabetes mellitus with diabetic chronic kidney disease: Secondary | ICD-10-CM | POA: Diagnosis not present

## 2018-05-03 DIAGNOSIS — D631 Anemia in chronic kidney disease: Secondary | ICD-10-CM | POA: Diagnosis not present

## 2018-05-03 DIAGNOSIS — I132 Hypertensive heart and chronic kidney disease with heart failure and with stage 5 chronic kidney disease, or end stage renal disease: Secondary | ICD-10-CM | POA: Diagnosis not present

## 2018-05-04 DIAGNOSIS — N2581 Secondary hyperparathyroidism of renal origin: Secondary | ICD-10-CM | POA: Diagnosis not present

## 2018-05-04 DIAGNOSIS — D631 Anemia in chronic kidney disease: Secondary | ICD-10-CM | POA: Diagnosis not present

## 2018-05-04 DIAGNOSIS — N186 End stage renal disease: Secondary | ICD-10-CM | POA: Diagnosis not present

## 2018-05-05 DIAGNOSIS — I132 Hypertensive heart and chronic kidney disease with heart failure and with stage 5 chronic kidney disease, or end stage renal disease: Secondary | ICD-10-CM | POA: Diagnosis not present

## 2018-05-05 DIAGNOSIS — E1122 Type 2 diabetes mellitus with diabetic chronic kidney disease: Secondary | ICD-10-CM | POA: Diagnosis not present

## 2018-05-05 DIAGNOSIS — I509 Heart failure, unspecified: Secondary | ICD-10-CM | POA: Diagnosis not present

## 2018-05-05 DIAGNOSIS — D631 Anemia in chronic kidney disease: Secondary | ICD-10-CM | POA: Diagnosis not present

## 2018-05-05 DIAGNOSIS — N186 End stage renal disease: Secondary | ICD-10-CM | POA: Diagnosis not present

## 2018-05-05 DIAGNOSIS — E1151 Type 2 diabetes mellitus with diabetic peripheral angiopathy without gangrene: Secondary | ICD-10-CM | POA: Diagnosis not present

## 2018-05-05 DIAGNOSIS — N2581 Secondary hyperparathyroidism of renal origin: Secondary | ICD-10-CM | POA: Diagnosis not present

## 2018-05-06 DIAGNOSIS — D631 Anemia in chronic kidney disease: Secondary | ICD-10-CM | POA: Diagnosis not present

## 2018-05-06 DIAGNOSIS — N2581 Secondary hyperparathyroidism of renal origin: Secondary | ICD-10-CM | POA: Diagnosis not present

## 2018-05-06 DIAGNOSIS — N186 End stage renal disease: Secondary | ICD-10-CM | POA: Diagnosis not present

## 2018-05-08 DIAGNOSIS — D631 Anemia in chronic kidney disease: Secondary | ICD-10-CM | POA: Diagnosis not present

## 2018-05-08 DIAGNOSIS — I509 Heart failure, unspecified: Secondary | ICD-10-CM | POA: Diagnosis not present

## 2018-05-08 DIAGNOSIS — E1151 Type 2 diabetes mellitus with diabetic peripheral angiopathy without gangrene: Secondary | ICD-10-CM | POA: Diagnosis not present

## 2018-05-08 DIAGNOSIS — E1122 Type 2 diabetes mellitus with diabetic chronic kidney disease: Secondary | ICD-10-CM | POA: Diagnosis not present

## 2018-05-08 DIAGNOSIS — I132 Hypertensive heart and chronic kidney disease with heart failure and with stage 5 chronic kidney disease, or end stage renal disease: Secondary | ICD-10-CM | POA: Diagnosis not present

## 2018-05-08 DIAGNOSIS — N186 End stage renal disease: Secondary | ICD-10-CM | POA: Diagnosis not present

## 2018-05-09 DIAGNOSIS — Z9181 History of falling: Secondary | ICD-10-CM | POA: Diagnosis not present

## 2018-05-09 DIAGNOSIS — Z89022 Acquired absence of left finger(s): Secondary | ICD-10-CM | POA: Diagnosis not present

## 2018-05-09 DIAGNOSIS — D631 Anemia in chronic kidney disease: Secondary | ICD-10-CM | POA: Diagnosis not present

## 2018-05-09 DIAGNOSIS — E669 Obesity, unspecified: Secondary | ICD-10-CM | POA: Diagnosis not present

## 2018-05-09 DIAGNOSIS — Z940 Kidney transplant status: Secondary | ICD-10-CM | POA: Diagnosis not present

## 2018-05-09 DIAGNOSIS — I509 Heart failure, unspecified: Secondary | ICD-10-CM | POA: Diagnosis not present

## 2018-05-09 DIAGNOSIS — I132 Hypertensive heart and chronic kidney disease with heart failure and with stage 5 chronic kidney disease, or end stage renal disease: Secondary | ICD-10-CM | POA: Diagnosis not present

## 2018-05-09 DIAGNOSIS — Z89021 Acquired absence of right finger(s): Secondary | ICD-10-CM | POA: Diagnosis not present

## 2018-05-09 DIAGNOSIS — N2581 Secondary hyperparathyroidism of renal origin: Secondary | ICD-10-CM | POA: Diagnosis not present

## 2018-05-09 DIAGNOSIS — Z48298 Encounter for aftercare following other organ transplant: Secondary | ICD-10-CM | POA: Diagnosis not present

## 2018-05-09 DIAGNOSIS — E1151 Type 2 diabetes mellitus with diabetic peripheral angiopathy without gangrene: Secondary | ICD-10-CM | POA: Diagnosis not present

## 2018-05-09 DIAGNOSIS — Z7982 Long term (current) use of aspirin: Secondary | ICD-10-CM | POA: Diagnosis not present

## 2018-05-09 DIAGNOSIS — Z794 Long term (current) use of insulin: Secondary | ICD-10-CM | POA: Diagnosis not present

## 2018-05-09 DIAGNOSIS — Z6839 Body mass index (BMI) 39.0-39.9, adult: Secondary | ICD-10-CM | POA: Diagnosis not present

## 2018-05-09 DIAGNOSIS — E1122 Type 2 diabetes mellitus with diabetic chronic kidney disease: Secondary | ICD-10-CM | POA: Diagnosis not present

## 2018-05-09 DIAGNOSIS — Z992 Dependence on renal dialysis: Secondary | ICD-10-CM | POA: Diagnosis not present

## 2018-05-09 DIAGNOSIS — I998 Other disorder of circulatory system: Secondary | ICD-10-CM | POA: Diagnosis not present

## 2018-05-09 DIAGNOSIS — N186 End stage renal disease: Secondary | ICD-10-CM | POA: Diagnosis not present

## 2018-05-10 DIAGNOSIS — E1151 Type 2 diabetes mellitus with diabetic peripheral angiopathy without gangrene: Secondary | ICD-10-CM | POA: Diagnosis not present

## 2018-05-10 DIAGNOSIS — I132 Hypertensive heart and chronic kidney disease with heart failure and with stage 5 chronic kidney disease, or end stage renal disease: Secondary | ICD-10-CM | POA: Diagnosis not present

## 2018-05-10 DIAGNOSIS — D631 Anemia in chronic kidney disease: Secondary | ICD-10-CM | POA: Diagnosis not present

## 2018-05-10 DIAGNOSIS — E1122 Type 2 diabetes mellitus with diabetic chronic kidney disease: Secondary | ICD-10-CM | POA: Diagnosis not present

## 2018-05-10 DIAGNOSIS — I509 Heart failure, unspecified: Secondary | ICD-10-CM | POA: Diagnosis not present

## 2018-05-10 DIAGNOSIS — N186 End stage renal disease: Secondary | ICD-10-CM | POA: Diagnosis not present

## 2018-05-11 DIAGNOSIS — N186 End stage renal disease: Secondary | ICD-10-CM | POA: Diagnosis not present

## 2018-05-11 DIAGNOSIS — D631 Anemia in chronic kidney disease: Secondary | ICD-10-CM | POA: Diagnosis not present

## 2018-05-11 DIAGNOSIS — N2581 Secondary hyperparathyroidism of renal origin: Secondary | ICD-10-CM | POA: Diagnosis not present

## 2018-05-13 DIAGNOSIS — N2581 Secondary hyperparathyroidism of renal origin: Secondary | ICD-10-CM | POA: Diagnosis not present

## 2018-05-13 DIAGNOSIS — D631 Anemia in chronic kidney disease: Secondary | ICD-10-CM | POA: Diagnosis not present

## 2018-05-13 DIAGNOSIS — N186 End stage renal disease: Secondary | ICD-10-CM | POA: Diagnosis not present

## 2018-05-15 DIAGNOSIS — I509 Heart failure, unspecified: Secondary | ICD-10-CM | POA: Diagnosis not present

## 2018-05-15 DIAGNOSIS — D631 Anemia in chronic kidney disease: Secondary | ICD-10-CM | POA: Diagnosis not present

## 2018-05-15 DIAGNOSIS — N186 End stage renal disease: Secondary | ICD-10-CM | POA: Diagnosis not present

## 2018-05-15 DIAGNOSIS — E1151 Type 2 diabetes mellitus with diabetic peripheral angiopathy without gangrene: Secondary | ICD-10-CM | POA: Diagnosis not present

## 2018-05-15 DIAGNOSIS — E1122 Type 2 diabetes mellitus with diabetic chronic kidney disease: Secondary | ICD-10-CM | POA: Diagnosis not present

## 2018-05-15 DIAGNOSIS — I132 Hypertensive heart and chronic kidney disease with heart failure and with stage 5 chronic kidney disease, or end stage renal disease: Secondary | ICD-10-CM | POA: Diagnosis not present

## 2018-05-16 DIAGNOSIS — N2581 Secondary hyperparathyroidism of renal origin: Secondary | ICD-10-CM | POA: Diagnosis not present

## 2018-05-16 DIAGNOSIS — D631 Anemia in chronic kidney disease: Secondary | ICD-10-CM | POA: Diagnosis not present

## 2018-05-16 DIAGNOSIS — N186 End stage renal disease: Secondary | ICD-10-CM | POA: Diagnosis not present

## 2018-05-17 DIAGNOSIS — D631 Anemia in chronic kidney disease: Secondary | ICD-10-CM | POA: Diagnosis not present

## 2018-05-17 DIAGNOSIS — E1122 Type 2 diabetes mellitus with diabetic chronic kidney disease: Secondary | ICD-10-CM | POA: Diagnosis not present

## 2018-05-17 DIAGNOSIS — E1151 Type 2 diabetes mellitus with diabetic peripheral angiopathy without gangrene: Secondary | ICD-10-CM | POA: Diagnosis not present

## 2018-05-17 DIAGNOSIS — N186 End stage renal disease: Secondary | ICD-10-CM | POA: Diagnosis not present

## 2018-05-17 DIAGNOSIS — I132 Hypertensive heart and chronic kidney disease with heart failure and with stage 5 chronic kidney disease, or end stage renal disease: Secondary | ICD-10-CM | POA: Diagnosis not present

## 2018-05-17 DIAGNOSIS — I509 Heart failure, unspecified: Secondary | ICD-10-CM | POA: Diagnosis not present

## 2018-05-18 DIAGNOSIS — N2581 Secondary hyperparathyroidism of renal origin: Secondary | ICD-10-CM | POA: Diagnosis not present

## 2018-05-18 DIAGNOSIS — D631 Anemia in chronic kidney disease: Secondary | ICD-10-CM | POA: Diagnosis not present

## 2018-05-18 DIAGNOSIS — N186 End stage renal disease: Secondary | ICD-10-CM | POA: Diagnosis not present

## 2018-05-19 DIAGNOSIS — I132 Hypertensive heart and chronic kidney disease with heart failure and with stage 5 chronic kidney disease, or end stage renal disease: Secondary | ICD-10-CM | POA: Diagnosis not present

## 2018-05-19 DIAGNOSIS — I509 Heart failure, unspecified: Secondary | ICD-10-CM | POA: Diagnosis not present

## 2018-05-19 DIAGNOSIS — E1122 Type 2 diabetes mellitus with diabetic chronic kidney disease: Secondary | ICD-10-CM | POA: Diagnosis not present

## 2018-05-19 DIAGNOSIS — N186 End stage renal disease: Secondary | ICD-10-CM | POA: Diagnosis not present

## 2018-05-19 DIAGNOSIS — E1151 Type 2 diabetes mellitus with diabetic peripheral angiopathy without gangrene: Secondary | ICD-10-CM | POA: Diagnosis not present

## 2018-05-19 DIAGNOSIS — D631 Anemia in chronic kidney disease: Secondary | ICD-10-CM | POA: Diagnosis not present

## 2018-05-20 DIAGNOSIS — N2581 Secondary hyperparathyroidism of renal origin: Secondary | ICD-10-CM | POA: Diagnosis not present

## 2018-05-20 DIAGNOSIS — N186 End stage renal disease: Secondary | ICD-10-CM | POA: Diagnosis not present

## 2018-05-20 DIAGNOSIS — D631 Anemia in chronic kidney disease: Secondary | ICD-10-CM | POA: Diagnosis not present

## 2018-05-22 DIAGNOSIS — N186 End stage renal disease: Secondary | ICD-10-CM | POA: Diagnosis not present

## 2018-05-22 DIAGNOSIS — E1151 Type 2 diabetes mellitus with diabetic peripheral angiopathy without gangrene: Secondary | ICD-10-CM | POA: Diagnosis not present

## 2018-05-22 DIAGNOSIS — E1122 Type 2 diabetes mellitus with diabetic chronic kidney disease: Secondary | ICD-10-CM | POA: Diagnosis not present

## 2018-05-22 DIAGNOSIS — I509 Heart failure, unspecified: Secondary | ICD-10-CM | POA: Diagnosis not present

## 2018-05-22 DIAGNOSIS — I132 Hypertensive heart and chronic kidney disease with heart failure and with stage 5 chronic kidney disease, or end stage renal disease: Secondary | ICD-10-CM | POA: Diagnosis not present

## 2018-05-22 DIAGNOSIS — D631 Anemia in chronic kidney disease: Secondary | ICD-10-CM | POA: Diagnosis not present

## 2018-05-23 DIAGNOSIS — N186 End stage renal disease: Secondary | ICD-10-CM | POA: Diagnosis not present

## 2018-05-23 DIAGNOSIS — N2581 Secondary hyperparathyroidism of renal origin: Secondary | ICD-10-CM | POA: Diagnosis not present

## 2018-05-23 DIAGNOSIS — D631 Anemia in chronic kidney disease: Secondary | ICD-10-CM | POA: Diagnosis not present

## 2018-05-23 DIAGNOSIS — D509 Iron deficiency anemia, unspecified: Secondary | ICD-10-CM | POA: Diagnosis not present

## 2018-05-23 DIAGNOSIS — I998 Other disorder of circulatory system: Secondary | ICD-10-CM | POA: Diagnosis not present

## 2018-05-24 DIAGNOSIS — D631 Anemia in chronic kidney disease: Secondary | ICD-10-CM | POA: Diagnosis not present

## 2018-05-24 DIAGNOSIS — N186 End stage renal disease: Secondary | ICD-10-CM | POA: Diagnosis not present

## 2018-05-24 DIAGNOSIS — E1122 Type 2 diabetes mellitus with diabetic chronic kidney disease: Secondary | ICD-10-CM | POA: Diagnosis not present

## 2018-05-24 DIAGNOSIS — E1151 Type 2 diabetes mellitus with diabetic peripheral angiopathy without gangrene: Secondary | ICD-10-CM | POA: Diagnosis not present

## 2018-05-24 DIAGNOSIS — I509 Heart failure, unspecified: Secondary | ICD-10-CM | POA: Diagnosis not present

## 2018-05-24 DIAGNOSIS — I132 Hypertensive heart and chronic kidney disease with heart failure and with stage 5 chronic kidney disease, or end stage renal disease: Secondary | ICD-10-CM | POA: Diagnosis not present

## 2018-05-25 DIAGNOSIS — N2581 Secondary hyperparathyroidism of renal origin: Secondary | ICD-10-CM | POA: Diagnosis not present

## 2018-05-25 DIAGNOSIS — D509 Iron deficiency anemia, unspecified: Secondary | ICD-10-CM | POA: Diagnosis not present

## 2018-05-25 DIAGNOSIS — E119 Type 2 diabetes mellitus without complications: Secondary | ICD-10-CM | POA: Diagnosis not present

## 2018-05-25 DIAGNOSIS — D631 Anemia in chronic kidney disease: Secondary | ICD-10-CM | POA: Diagnosis not present

## 2018-05-25 DIAGNOSIS — N186 End stage renal disease: Secondary | ICD-10-CM | POA: Diagnosis not present

## 2018-05-26 DIAGNOSIS — I509 Heart failure, unspecified: Secondary | ICD-10-CM | POA: Diagnosis not present

## 2018-05-26 DIAGNOSIS — I132 Hypertensive heart and chronic kidney disease with heart failure and with stage 5 chronic kidney disease, or end stage renal disease: Secondary | ICD-10-CM | POA: Diagnosis not present

## 2018-05-26 DIAGNOSIS — E1122 Type 2 diabetes mellitus with diabetic chronic kidney disease: Secondary | ICD-10-CM | POA: Diagnosis not present

## 2018-05-26 DIAGNOSIS — D631 Anemia in chronic kidney disease: Secondary | ICD-10-CM | POA: Diagnosis not present

## 2018-05-26 DIAGNOSIS — N186 End stage renal disease: Secondary | ICD-10-CM | POA: Diagnosis not present

## 2018-05-26 DIAGNOSIS — E1151 Type 2 diabetes mellitus with diabetic peripheral angiopathy without gangrene: Secondary | ICD-10-CM | POA: Diagnosis not present

## 2018-05-27 DIAGNOSIS — N186 End stage renal disease: Secondary | ICD-10-CM | POA: Diagnosis not present

## 2018-05-27 DIAGNOSIS — D631 Anemia in chronic kidney disease: Secondary | ICD-10-CM | POA: Diagnosis not present

## 2018-05-27 DIAGNOSIS — N2581 Secondary hyperparathyroidism of renal origin: Secondary | ICD-10-CM | POA: Diagnosis not present

## 2018-05-27 DIAGNOSIS — D509 Iron deficiency anemia, unspecified: Secondary | ICD-10-CM | POA: Diagnosis not present

## 2018-05-29 DIAGNOSIS — M2550 Pain in unspecified joint: Secondary | ICD-10-CM | POA: Diagnosis not present

## 2018-05-29 DIAGNOSIS — E1151 Type 2 diabetes mellitus with diabetic peripheral angiopathy without gangrene: Secondary | ICD-10-CM | POA: Diagnosis not present

## 2018-05-29 DIAGNOSIS — I5032 Chronic diastolic (congestive) heart failure: Secondary | ICD-10-CM | POA: Diagnosis not present

## 2018-05-29 DIAGNOSIS — N186 End stage renal disease: Secondary | ICD-10-CM | POA: Diagnosis not present

## 2018-05-29 DIAGNOSIS — E1122 Type 2 diabetes mellitus with diabetic chronic kidney disease: Secondary | ICD-10-CM | POA: Diagnosis not present

## 2018-05-29 DIAGNOSIS — Z79899 Other long term (current) drug therapy: Secondary | ICD-10-CM | POA: Diagnosis not present

## 2018-05-29 DIAGNOSIS — E7800 Pure hypercholesterolemia, unspecified: Secondary | ICD-10-CM | POA: Diagnosis not present

## 2018-05-29 DIAGNOSIS — M79643 Pain in unspecified hand: Secondary | ICD-10-CM | POA: Diagnosis not present

## 2018-05-29 DIAGNOSIS — I739 Peripheral vascular disease, unspecified: Secondary | ICD-10-CM | POA: Diagnosis not present

## 2018-05-29 DIAGNOSIS — M1990 Unspecified osteoarthritis, unspecified site: Secondary | ICD-10-CM | POA: Diagnosis not present

## 2018-05-29 DIAGNOSIS — R55 Syncope and collapse: Secondary | ICD-10-CM | POA: Diagnosis not present

## 2018-05-29 DIAGNOSIS — Z79891 Long term (current) use of opiate analgesic: Secondary | ICD-10-CM | POA: Diagnosis not present

## 2018-05-29 DIAGNOSIS — I132 Hypertensive heart and chronic kidney disease with heart failure and with stage 5 chronic kidney disease, or end stage renal disease: Secondary | ICD-10-CM | POA: Diagnosis not present

## 2018-05-29 DIAGNOSIS — E1140 Type 2 diabetes mellitus with diabetic neuropathy, unspecified: Secondary | ICD-10-CM | POA: Diagnosis not present

## 2018-05-29 DIAGNOSIS — I509 Heart failure, unspecified: Secondary | ICD-10-CM | POA: Diagnosis not present

## 2018-05-29 DIAGNOSIS — D631 Anemia in chronic kidney disease: Secondary | ICD-10-CM | POA: Diagnosis not present

## 2018-05-29 DIAGNOSIS — E875 Hyperkalemia: Secondary | ICD-10-CM | POA: Diagnosis not present

## 2018-05-30 DIAGNOSIS — D509 Iron deficiency anemia, unspecified: Secondary | ICD-10-CM | POA: Diagnosis not present

## 2018-05-30 DIAGNOSIS — D631 Anemia in chronic kidney disease: Secondary | ICD-10-CM | POA: Diagnosis not present

## 2018-05-30 DIAGNOSIS — N186 End stage renal disease: Secondary | ICD-10-CM | POA: Diagnosis not present

## 2018-05-30 DIAGNOSIS — N2581 Secondary hyperparathyroidism of renal origin: Secondary | ICD-10-CM | POA: Diagnosis not present

## 2018-05-31 DIAGNOSIS — D631 Anemia in chronic kidney disease: Secondary | ICD-10-CM | POA: Diagnosis not present

## 2018-05-31 DIAGNOSIS — E1122 Type 2 diabetes mellitus with diabetic chronic kidney disease: Secondary | ICD-10-CM | POA: Diagnosis not present

## 2018-05-31 DIAGNOSIS — L03011 Cellulitis of right finger: Secondary | ICD-10-CM | POA: Diagnosis not present

## 2018-05-31 DIAGNOSIS — I132 Hypertensive heart and chronic kidney disease with heart failure and with stage 5 chronic kidney disease, or end stage renal disease: Secondary | ICD-10-CM | POA: Diagnosis not present

## 2018-05-31 DIAGNOSIS — E1151 Type 2 diabetes mellitus with diabetic peripheral angiopathy without gangrene: Secondary | ICD-10-CM | POA: Diagnosis not present

## 2018-05-31 DIAGNOSIS — L03012 Cellulitis of left finger: Secondary | ICD-10-CM | POA: Diagnosis not present

## 2018-05-31 DIAGNOSIS — N186 End stage renal disease: Secondary | ICD-10-CM | POA: Diagnosis not present

## 2018-05-31 DIAGNOSIS — I509 Heart failure, unspecified: Secondary | ICD-10-CM | POA: Diagnosis not present

## 2018-05-31 DIAGNOSIS — I998 Other disorder of circulatory system: Secondary | ICD-10-CM | POA: Diagnosis not present

## 2018-06-01 DIAGNOSIS — N2581 Secondary hyperparathyroidism of renal origin: Secondary | ICD-10-CM | POA: Diagnosis not present

## 2018-06-01 DIAGNOSIS — N186 End stage renal disease: Secondary | ICD-10-CM | POA: Diagnosis not present

## 2018-06-01 DIAGNOSIS — D631 Anemia in chronic kidney disease: Secondary | ICD-10-CM | POA: Diagnosis not present

## 2018-06-01 DIAGNOSIS — D509 Iron deficiency anemia, unspecified: Secondary | ICD-10-CM | POA: Diagnosis not present

## 2018-06-02 DIAGNOSIS — I509 Heart failure, unspecified: Secondary | ICD-10-CM | POA: Diagnosis not present

## 2018-06-02 DIAGNOSIS — E1151 Type 2 diabetes mellitus with diabetic peripheral angiopathy without gangrene: Secondary | ICD-10-CM | POA: Diagnosis not present

## 2018-06-02 DIAGNOSIS — E1122 Type 2 diabetes mellitus with diabetic chronic kidney disease: Secondary | ICD-10-CM | POA: Diagnosis not present

## 2018-06-02 DIAGNOSIS — D631 Anemia in chronic kidney disease: Secondary | ICD-10-CM | POA: Diagnosis not present

## 2018-06-02 DIAGNOSIS — N186 End stage renal disease: Secondary | ICD-10-CM | POA: Diagnosis not present

## 2018-06-02 DIAGNOSIS — I132 Hypertensive heart and chronic kidney disease with heart failure and with stage 5 chronic kidney disease, or end stage renal disease: Secondary | ICD-10-CM | POA: Diagnosis not present

## 2018-06-03 DIAGNOSIS — D631 Anemia in chronic kidney disease: Secondary | ICD-10-CM | POA: Diagnosis not present

## 2018-06-03 DIAGNOSIS — N186 End stage renal disease: Secondary | ICD-10-CM | POA: Diagnosis not present

## 2018-06-03 DIAGNOSIS — D509 Iron deficiency anemia, unspecified: Secondary | ICD-10-CM | POA: Diagnosis not present

## 2018-06-03 DIAGNOSIS — N2581 Secondary hyperparathyroidism of renal origin: Secondary | ICD-10-CM | POA: Diagnosis not present

## 2018-06-05 DIAGNOSIS — D631 Anemia in chronic kidney disease: Secondary | ICD-10-CM | POA: Diagnosis not present

## 2018-06-05 DIAGNOSIS — E1151 Type 2 diabetes mellitus with diabetic peripheral angiopathy without gangrene: Secondary | ICD-10-CM | POA: Diagnosis not present

## 2018-06-05 DIAGNOSIS — N186 End stage renal disease: Secondary | ICD-10-CM | POA: Diagnosis not present

## 2018-06-05 DIAGNOSIS — I509 Heart failure, unspecified: Secondary | ICD-10-CM | POA: Diagnosis not present

## 2018-06-05 DIAGNOSIS — E1122 Type 2 diabetes mellitus with diabetic chronic kidney disease: Secondary | ICD-10-CM | POA: Diagnosis not present

## 2018-06-05 DIAGNOSIS — I132 Hypertensive heart and chronic kidney disease with heart failure and with stage 5 chronic kidney disease, or end stage renal disease: Secondary | ICD-10-CM | POA: Diagnosis not present

## 2018-06-06 DIAGNOSIS — N2581 Secondary hyperparathyroidism of renal origin: Secondary | ICD-10-CM | POA: Diagnosis not present

## 2018-06-06 DIAGNOSIS — D509 Iron deficiency anemia, unspecified: Secondary | ICD-10-CM | POA: Diagnosis not present

## 2018-06-06 DIAGNOSIS — D631 Anemia in chronic kidney disease: Secondary | ICD-10-CM | POA: Diagnosis not present

## 2018-06-06 DIAGNOSIS — N186 End stage renal disease: Secondary | ICD-10-CM | POA: Diagnosis not present

## 2018-06-07 DIAGNOSIS — N186 End stage renal disease: Secondary | ICD-10-CM | POA: Diagnosis not present

## 2018-06-07 DIAGNOSIS — E1151 Type 2 diabetes mellitus with diabetic peripheral angiopathy without gangrene: Secondary | ICD-10-CM | POA: Diagnosis not present

## 2018-06-07 DIAGNOSIS — I509 Heart failure, unspecified: Secondary | ICD-10-CM | POA: Diagnosis not present

## 2018-06-07 DIAGNOSIS — I132 Hypertensive heart and chronic kidney disease with heart failure and with stage 5 chronic kidney disease, or end stage renal disease: Secondary | ICD-10-CM | POA: Diagnosis not present

## 2018-06-07 DIAGNOSIS — D631 Anemia in chronic kidney disease: Secondary | ICD-10-CM | POA: Diagnosis not present

## 2018-06-07 DIAGNOSIS — E1122 Type 2 diabetes mellitus with diabetic chronic kidney disease: Secondary | ICD-10-CM | POA: Diagnosis not present

## 2018-06-08 DIAGNOSIS — Z89021 Acquired absence of right finger(s): Secondary | ICD-10-CM | POA: Diagnosis not present

## 2018-06-08 DIAGNOSIS — Z794 Long term (current) use of insulin: Secondary | ICD-10-CM | POA: Diagnosis not present

## 2018-06-08 DIAGNOSIS — D631 Anemia in chronic kidney disease: Secondary | ICD-10-CM | POA: Diagnosis not present

## 2018-06-08 DIAGNOSIS — N2581 Secondary hyperparathyroidism of renal origin: Secondary | ICD-10-CM | POA: Diagnosis not present

## 2018-06-08 DIAGNOSIS — I509 Heart failure, unspecified: Secondary | ICD-10-CM | POA: Diagnosis not present

## 2018-06-08 DIAGNOSIS — D509 Iron deficiency anemia, unspecified: Secondary | ICD-10-CM | POA: Diagnosis not present

## 2018-06-08 DIAGNOSIS — Z992 Dependence on renal dialysis: Secondary | ICD-10-CM | POA: Diagnosis not present

## 2018-06-08 DIAGNOSIS — Z9181 History of falling: Secondary | ICD-10-CM | POA: Diagnosis not present

## 2018-06-08 DIAGNOSIS — Z6839 Body mass index (BMI) 39.0-39.9, adult: Secondary | ICD-10-CM | POA: Diagnosis not present

## 2018-06-08 DIAGNOSIS — E1151 Type 2 diabetes mellitus with diabetic peripheral angiopathy without gangrene: Secondary | ICD-10-CM | POA: Diagnosis not present

## 2018-06-08 DIAGNOSIS — T8189XA Other complications of procedures, not elsewhere classified, initial encounter: Secondary | ICD-10-CM | POA: Diagnosis not present

## 2018-06-08 DIAGNOSIS — N186 End stage renal disease: Secondary | ICD-10-CM | POA: Diagnosis not present

## 2018-06-08 DIAGNOSIS — Z89022 Acquired absence of left finger(s): Secondary | ICD-10-CM | POA: Diagnosis not present

## 2018-06-08 DIAGNOSIS — I132 Hypertensive heart and chronic kidney disease with heart failure and with stage 5 chronic kidney disease, or end stage renal disease: Secondary | ICD-10-CM | POA: Diagnosis not present

## 2018-06-08 DIAGNOSIS — Z7982 Long term (current) use of aspirin: Secondary | ICD-10-CM | POA: Diagnosis not present

## 2018-06-08 DIAGNOSIS — I998 Other disorder of circulatory system: Secondary | ICD-10-CM | POA: Diagnosis not present

## 2018-06-08 DIAGNOSIS — E1122 Type 2 diabetes mellitus with diabetic chronic kidney disease: Secondary | ICD-10-CM | POA: Diagnosis not present

## 2018-06-08 DIAGNOSIS — Z940 Kidney transplant status: Secondary | ICD-10-CM | POA: Diagnosis not present

## 2018-06-08 DIAGNOSIS — E669 Obesity, unspecified: Secondary | ICD-10-CM | POA: Diagnosis not present

## 2018-06-10 DIAGNOSIS — N186 End stage renal disease: Secondary | ICD-10-CM | POA: Diagnosis not present

## 2018-06-10 DIAGNOSIS — D509 Iron deficiency anemia, unspecified: Secondary | ICD-10-CM | POA: Diagnosis not present

## 2018-06-10 DIAGNOSIS — N2581 Secondary hyperparathyroidism of renal origin: Secondary | ICD-10-CM | POA: Diagnosis not present

## 2018-06-10 DIAGNOSIS — D631 Anemia in chronic kidney disease: Secondary | ICD-10-CM | POA: Diagnosis not present

## 2018-06-13 DIAGNOSIS — N186 End stage renal disease: Secondary | ICD-10-CM | POA: Diagnosis not present

## 2018-06-13 DIAGNOSIS — N2581 Secondary hyperparathyroidism of renal origin: Secondary | ICD-10-CM | POA: Diagnosis not present

## 2018-06-13 DIAGNOSIS — D631 Anemia in chronic kidney disease: Secondary | ICD-10-CM | POA: Diagnosis not present

## 2018-06-13 DIAGNOSIS — D509 Iron deficiency anemia, unspecified: Secondary | ICD-10-CM | POA: Diagnosis not present

## 2018-06-14 DIAGNOSIS — I509 Heart failure, unspecified: Secondary | ICD-10-CM | POA: Diagnosis not present

## 2018-06-14 DIAGNOSIS — D631 Anemia in chronic kidney disease: Secondary | ICD-10-CM | POA: Diagnosis not present

## 2018-06-14 DIAGNOSIS — N2581 Secondary hyperparathyroidism of renal origin: Secondary | ICD-10-CM | POA: Diagnosis not present

## 2018-06-14 DIAGNOSIS — E1122 Type 2 diabetes mellitus with diabetic chronic kidney disease: Secondary | ICD-10-CM | POA: Diagnosis not present

## 2018-06-14 DIAGNOSIS — I132 Hypertensive heart and chronic kidney disease with heart failure and with stage 5 chronic kidney disease, or end stage renal disease: Secondary | ICD-10-CM | POA: Diagnosis not present

## 2018-06-14 DIAGNOSIS — N186 End stage renal disease: Secondary | ICD-10-CM | POA: Diagnosis not present

## 2018-06-15 DIAGNOSIS — N186 End stage renal disease: Secondary | ICD-10-CM | POA: Diagnosis not present

## 2018-06-15 DIAGNOSIS — D631 Anemia in chronic kidney disease: Secondary | ICD-10-CM | POA: Diagnosis not present

## 2018-06-15 DIAGNOSIS — N2581 Secondary hyperparathyroidism of renal origin: Secondary | ICD-10-CM | POA: Diagnosis not present

## 2018-06-15 DIAGNOSIS — D509 Iron deficiency anemia, unspecified: Secondary | ICD-10-CM | POA: Diagnosis not present

## 2018-06-16 DIAGNOSIS — I998 Other disorder of circulatory system: Secondary | ICD-10-CM | POA: Diagnosis not present

## 2018-06-17 DIAGNOSIS — D509 Iron deficiency anemia, unspecified: Secondary | ICD-10-CM | POA: Diagnosis not present

## 2018-06-17 DIAGNOSIS — N186 End stage renal disease: Secondary | ICD-10-CM | POA: Diagnosis not present

## 2018-06-17 DIAGNOSIS — D631 Anemia in chronic kidney disease: Secondary | ICD-10-CM | POA: Diagnosis not present

## 2018-06-17 DIAGNOSIS — N2581 Secondary hyperparathyroidism of renal origin: Secondary | ICD-10-CM | POA: Diagnosis not present

## 2018-06-20 DIAGNOSIS — Z992 Dependence on renal dialysis: Secondary | ICD-10-CM | POA: Diagnosis not present

## 2018-06-20 DIAGNOSIS — D631 Anemia in chronic kidney disease: Secondary | ICD-10-CM | POA: Diagnosis not present

## 2018-06-20 DIAGNOSIS — N186 End stage renal disease: Secondary | ICD-10-CM | POA: Diagnosis not present

## 2018-06-20 DIAGNOSIS — D509 Iron deficiency anemia, unspecified: Secondary | ICD-10-CM | POA: Diagnosis not present

## 2018-06-20 DIAGNOSIS — N2581 Secondary hyperparathyroidism of renal origin: Secondary | ICD-10-CM | POA: Diagnosis not present

## 2018-06-21 DIAGNOSIS — E785 Hyperlipidemia, unspecified: Secondary | ICD-10-CM | POA: Diagnosis not present

## 2018-06-21 DIAGNOSIS — D631 Anemia in chronic kidney disease: Secondary | ICD-10-CM | POA: Diagnosis not present

## 2018-06-21 DIAGNOSIS — N186 End stage renal disease: Secondary | ICD-10-CM | POA: Diagnosis not present

## 2018-06-21 DIAGNOSIS — I132 Hypertensive heart and chronic kidney disease with heart failure and with stage 5 chronic kidney disease, or end stage renal disease: Secondary | ICD-10-CM | POA: Diagnosis not present

## 2018-06-21 DIAGNOSIS — N2581 Secondary hyperparathyroidism of renal origin: Secondary | ICD-10-CM | POA: Diagnosis not present

## 2018-06-21 DIAGNOSIS — I509 Heart failure, unspecified: Secondary | ICD-10-CM | POA: Diagnosis not present

## 2018-06-21 DIAGNOSIS — E1122 Type 2 diabetes mellitus with diabetic chronic kidney disease: Secondary | ICD-10-CM | POA: Diagnosis not present

## 2018-06-22 DIAGNOSIS — D631 Anemia in chronic kidney disease: Secondary | ICD-10-CM | POA: Diagnosis not present

## 2018-06-22 DIAGNOSIS — E785 Hyperlipidemia, unspecified: Secondary | ICD-10-CM | POA: Diagnosis not present

## 2018-06-22 DIAGNOSIS — E119 Type 2 diabetes mellitus without complications: Secondary | ICD-10-CM | POA: Diagnosis not present

## 2018-06-22 DIAGNOSIS — N186 End stage renal disease: Secondary | ICD-10-CM | POA: Diagnosis not present

## 2018-06-22 DIAGNOSIS — N2581 Secondary hyperparathyroidism of renal origin: Secondary | ICD-10-CM | POA: Diagnosis not present

## 2018-06-22 DIAGNOSIS — E1121 Type 2 diabetes mellitus with diabetic nephropathy: Secondary | ICD-10-CM | POA: Diagnosis not present

## 2018-06-24 DIAGNOSIS — N186 End stage renal disease: Secondary | ICD-10-CM | POA: Diagnosis not present

## 2018-06-24 DIAGNOSIS — D631 Anemia in chronic kidney disease: Secondary | ICD-10-CM | POA: Diagnosis not present

## 2018-06-24 DIAGNOSIS — E785 Hyperlipidemia, unspecified: Secondary | ICD-10-CM | POA: Diagnosis not present

## 2018-06-24 DIAGNOSIS — N2581 Secondary hyperparathyroidism of renal origin: Secondary | ICD-10-CM | POA: Diagnosis not present

## 2018-06-27 DIAGNOSIS — N186 End stage renal disease: Secondary | ICD-10-CM | POA: Diagnosis not present

## 2018-06-27 DIAGNOSIS — D631 Anemia in chronic kidney disease: Secondary | ICD-10-CM | POA: Diagnosis not present

## 2018-06-27 DIAGNOSIS — N2581 Secondary hyperparathyroidism of renal origin: Secondary | ICD-10-CM | POA: Diagnosis not present

## 2018-06-27 DIAGNOSIS — E785 Hyperlipidemia, unspecified: Secondary | ICD-10-CM | POA: Diagnosis not present

## 2018-06-28 DIAGNOSIS — D631 Anemia in chronic kidney disease: Secondary | ICD-10-CM | POA: Diagnosis not present

## 2018-06-28 DIAGNOSIS — I509 Heart failure, unspecified: Secondary | ICD-10-CM | POA: Diagnosis not present

## 2018-06-28 DIAGNOSIS — N186 End stage renal disease: Secondary | ICD-10-CM | POA: Diagnosis not present

## 2018-06-28 DIAGNOSIS — E1122 Type 2 diabetes mellitus with diabetic chronic kidney disease: Secondary | ICD-10-CM | POA: Diagnosis not present

## 2018-06-28 DIAGNOSIS — N2581 Secondary hyperparathyroidism of renal origin: Secondary | ICD-10-CM | POA: Diagnosis not present

## 2018-06-28 DIAGNOSIS — I132 Hypertensive heart and chronic kidney disease with heart failure and with stage 5 chronic kidney disease, or end stage renal disease: Secondary | ICD-10-CM | POA: Diagnosis not present

## 2018-06-29 DIAGNOSIS — N186 End stage renal disease: Secondary | ICD-10-CM | POA: Diagnosis not present

## 2018-06-29 DIAGNOSIS — N2581 Secondary hyperparathyroidism of renal origin: Secondary | ICD-10-CM | POA: Diagnosis not present

## 2018-06-29 DIAGNOSIS — D631 Anemia in chronic kidney disease: Secondary | ICD-10-CM | POA: Diagnosis not present

## 2018-06-29 DIAGNOSIS — E785 Hyperlipidemia, unspecified: Secondary | ICD-10-CM | POA: Diagnosis not present

## 2018-07-01 DIAGNOSIS — N2581 Secondary hyperparathyroidism of renal origin: Secondary | ICD-10-CM | POA: Diagnosis not present

## 2018-07-01 DIAGNOSIS — N186 End stage renal disease: Secondary | ICD-10-CM | POA: Diagnosis not present

## 2018-07-01 DIAGNOSIS — E785 Hyperlipidemia, unspecified: Secondary | ICD-10-CM | POA: Diagnosis not present

## 2018-07-01 DIAGNOSIS — D631 Anemia in chronic kidney disease: Secondary | ICD-10-CM | POA: Diagnosis not present

## 2018-07-04 DIAGNOSIS — M79643 Pain in unspecified hand: Secondary | ICD-10-CM | POA: Diagnosis not present

## 2018-07-04 DIAGNOSIS — E1140 Type 2 diabetes mellitus with diabetic neuropathy, unspecified: Secondary | ICD-10-CM | POA: Diagnosis not present

## 2018-07-04 DIAGNOSIS — N2581 Secondary hyperparathyroidism of renal origin: Secondary | ICD-10-CM | POA: Diagnosis not present

## 2018-07-04 DIAGNOSIS — M1990 Unspecified osteoarthritis, unspecified site: Secondary | ICD-10-CM | POA: Diagnosis not present

## 2018-07-04 DIAGNOSIS — Z79891 Long term (current) use of opiate analgesic: Secondary | ICD-10-CM | POA: Diagnosis not present

## 2018-07-04 DIAGNOSIS — I739 Peripheral vascular disease, unspecified: Secondary | ICD-10-CM | POA: Diagnosis not present

## 2018-07-04 DIAGNOSIS — M2550 Pain in unspecified joint: Secondary | ICD-10-CM | POA: Diagnosis not present

## 2018-07-04 DIAGNOSIS — Z79899 Other long term (current) drug therapy: Secondary | ICD-10-CM | POA: Diagnosis not present

## 2018-07-04 DIAGNOSIS — B029 Zoster without complications: Secondary | ICD-10-CM | POA: Diagnosis not present

## 2018-07-04 DIAGNOSIS — E785 Hyperlipidemia, unspecified: Secondary | ICD-10-CM | POA: Diagnosis not present

## 2018-07-04 DIAGNOSIS — E875 Hyperkalemia: Secondary | ICD-10-CM | POA: Diagnosis not present

## 2018-07-04 DIAGNOSIS — N186 End stage renal disease: Secondary | ICD-10-CM | POA: Diagnosis not present

## 2018-07-04 DIAGNOSIS — E7800 Pure hypercholesterolemia, unspecified: Secondary | ICD-10-CM | POA: Diagnosis not present

## 2018-07-04 DIAGNOSIS — D631 Anemia in chronic kidney disease: Secondary | ICD-10-CM | POA: Diagnosis not present

## 2018-07-04 DIAGNOSIS — I5030 Unspecified diastolic (congestive) heart failure: Secondary | ICD-10-CM | POA: Diagnosis not present

## 2018-07-05 ENCOUNTER — Telehealth: Payer: Self-pay | Admitting: Interventional Cardiology

## 2018-07-05 DIAGNOSIS — D631 Anemia in chronic kidney disease: Secondary | ICD-10-CM | POA: Diagnosis not present

## 2018-07-05 DIAGNOSIS — I509 Heart failure, unspecified: Secondary | ICD-10-CM | POA: Diagnosis not present

## 2018-07-05 DIAGNOSIS — I132 Hypertensive heart and chronic kidney disease with heart failure and with stage 5 chronic kidney disease, or end stage renal disease: Secondary | ICD-10-CM | POA: Diagnosis not present

## 2018-07-05 DIAGNOSIS — N2581 Secondary hyperparathyroidism of renal origin: Secondary | ICD-10-CM | POA: Diagnosis not present

## 2018-07-05 DIAGNOSIS — N186 End stage renal disease: Secondary | ICD-10-CM | POA: Diagnosis not present

## 2018-07-05 DIAGNOSIS — E1122 Type 2 diabetes mellitus with diabetic chronic kidney disease: Secondary | ICD-10-CM | POA: Diagnosis not present

## 2018-07-05 NOTE — Telephone Encounter (Signed)
Spoke with pt about appt scheduled with Dr. Tamala Julian on 4/22.  Pt agreeable to virtual video appt.  Advised someone will call him to get everything set up.

## 2018-07-06 DIAGNOSIS — E785 Hyperlipidemia, unspecified: Secondary | ICD-10-CM | POA: Diagnosis not present

## 2018-07-06 DIAGNOSIS — N2581 Secondary hyperparathyroidism of renal origin: Secondary | ICD-10-CM | POA: Diagnosis not present

## 2018-07-06 DIAGNOSIS — N186 End stage renal disease: Secondary | ICD-10-CM | POA: Diagnosis not present

## 2018-07-06 DIAGNOSIS — D631 Anemia in chronic kidney disease: Secondary | ICD-10-CM | POA: Diagnosis not present

## 2018-07-08 DIAGNOSIS — Z992 Dependence on renal dialysis: Secondary | ICD-10-CM | POA: Diagnosis not present

## 2018-07-08 DIAGNOSIS — N2581 Secondary hyperparathyroidism of renal origin: Secondary | ICD-10-CM | POA: Diagnosis not present

## 2018-07-08 DIAGNOSIS — N186 End stage renal disease: Secondary | ICD-10-CM | POA: Diagnosis not present

## 2018-07-08 DIAGNOSIS — I998 Other disorder of circulatory system: Secondary | ICD-10-CM | POA: Diagnosis not present

## 2018-07-08 DIAGNOSIS — I132 Hypertensive heart and chronic kidney disease with heart failure and with stage 5 chronic kidney disease, or end stage renal disease: Secondary | ICD-10-CM | POA: Diagnosis not present

## 2018-07-08 DIAGNOSIS — E1122 Type 2 diabetes mellitus with diabetic chronic kidney disease: Secondary | ICD-10-CM | POA: Diagnosis not present

## 2018-07-08 DIAGNOSIS — T8189XA Other complications of procedures, not elsewhere classified, initial encounter: Secondary | ICD-10-CM | POA: Diagnosis not present

## 2018-07-08 DIAGNOSIS — Z940 Kidney transplant status: Secondary | ICD-10-CM | POA: Diagnosis not present

## 2018-07-08 DIAGNOSIS — E1151 Type 2 diabetes mellitus with diabetic peripheral angiopathy without gangrene: Secondary | ICD-10-CM | POA: Diagnosis not present

## 2018-07-08 DIAGNOSIS — Z7982 Long term (current) use of aspirin: Secondary | ICD-10-CM | POA: Diagnosis not present

## 2018-07-08 DIAGNOSIS — I509 Heart failure, unspecified: Secondary | ICD-10-CM | POA: Diagnosis not present

## 2018-07-08 DIAGNOSIS — Z6839 Body mass index (BMI) 39.0-39.9, adult: Secondary | ICD-10-CM | POA: Diagnosis not present

## 2018-07-08 DIAGNOSIS — Z9181 History of falling: Secondary | ICD-10-CM | POA: Diagnosis not present

## 2018-07-08 DIAGNOSIS — D631 Anemia in chronic kidney disease: Secondary | ICD-10-CM | POA: Diagnosis not present

## 2018-07-08 DIAGNOSIS — E785 Hyperlipidemia, unspecified: Secondary | ICD-10-CM | POA: Diagnosis not present

## 2018-07-08 DIAGNOSIS — Z89022 Acquired absence of left finger(s): Secondary | ICD-10-CM | POA: Diagnosis not present

## 2018-07-08 DIAGNOSIS — Z794 Long term (current) use of insulin: Secondary | ICD-10-CM | POA: Diagnosis not present

## 2018-07-08 DIAGNOSIS — Z89021 Acquired absence of right finger(s): Secondary | ICD-10-CM | POA: Diagnosis not present

## 2018-07-08 DIAGNOSIS — E669 Obesity, unspecified: Secondary | ICD-10-CM | POA: Diagnosis not present

## 2018-07-11 ENCOUNTER — Telehealth: Payer: Self-pay

## 2018-07-11 DIAGNOSIS — E785 Hyperlipidemia, unspecified: Secondary | ICD-10-CM | POA: Diagnosis not present

## 2018-07-11 DIAGNOSIS — D631 Anemia in chronic kidney disease: Secondary | ICD-10-CM | POA: Diagnosis not present

## 2018-07-11 DIAGNOSIS — N2581 Secondary hyperparathyroidism of renal origin: Secondary | ICD-10-CM | POA: Diagnosis not present

## 2018-07-11 DIAGNOSIS — N186 End stage renal disease: Secondary | ICD-10-CM | POA: Diagnosis not present

## 2018-07-11 NOTE — Telephone Encounter (Signed)
YOUR CARDIOLOGY TEAM HAS ARRANGED FOR AN E-VISIT FOR YOUR APPOINTMENT - PLEASE REVIEW IMPORTANT INFORMATION BELOW SEVERAL DAYS PRIOR TO YOUR APPOINTMENT  Due to the recent COVID-19 pandemic, we are transitioning in-person office visits to tele-medicine visits in an effort to decrease unnecessary exposure to our patients, their families, and staff. These visits are billed to your insurance just like a normal visit is. We also encourage you to sign up for MyChart if you have not already done so. You will need a smartphone if possible. For patients that do not have this, we can still complete the visit using a regular telephone but do prefer a smartphone to enable video when possible. You may have a family member that lives with you that can help. If possible, we also ask that you have a blood pressure cuff and scale at home to measure your blood pressure, heart rate and weight prior to your scheduled appointment. Patients with clinical needs that need an in-person evaluation and testing will still be able to come to the office if absolutely necessary. If you have any questions, feel free to call our office.     YOUR PROVIDER WILL BE USING THE FOLLOWING PLATFORM TO COMPLETE YOUR VISIT: Yes  . IF USING MYCHART - How to Download the MyChart App to Your SmartPhone   - If Apple, go to [employer] and type in MyChart in the search bar and download the app. If Android, ask patient to go to [employer] and type in [place] in the search bar and download the app. The app is free but as with any other app downloads, your phone may require you to verify saved payment information or Apple/Android password.  - You will need to then log into the app with your MyChart username and password, and select [facility] as your healthcare provider to link the account.  - When it is time for your visit, go to the MyChart app, find appointments, and click Begin Video Visit. Be sure to Select Allow for your device to access  the Microphone and Camera for your visit. You will then be connected, and your provider will be with you shortly.  **If you have any issues connecting or need assistance, please contact MyChart service desk [phone])**  **If using a computer, in order to ensure the best quality for your visit, you will need to use either of the following Internet Browsers: Insurance underwriter or [employer]**  . IF USING DOXIMITY or DOXY.ME - The staff will give you instructions on receiving your link to join the meeting the day of your visit.      2-3 DAYS BEFORE YOUR APPOINTMENT  You will receive a telephone call from one of our [place] team members - your caller ID may say "Unknown caller." If this is a video visit, we will walk you through how to get the video launched on your phone. We will remind you check your blood pressure, heart rate and weight prior to your scheduled appointment. If you have an Apple Watch or Kardia, please upload any pertinent ECG strips the day before or morning of your appointment to [place]. Our staff will also make sure you have reviewed the consent and agree to move forward with your scheduled tele-health visit.     THE DAY OF YOUR APPOINTMENT  Approximately 15 minutes prior to your scheduled appointment, you will receive a telephone call from one of [place] team - your caller ID may say "Unknown caller."  Our staff will confirm medications, vital signs for the day and any symptoms you may be experiencing. Please have this information available prior to the time of visit start. It may also be helpful for you to have a pad of paper and pen handy for any instructions given during your visit. They will also walk you through joining the smartphone meeting if this is a video visit.    CONSENT FOR TELE-HEALTH VISIT - PLEASE REVIEW  I hereby voluntarily request, consent and authorize [facility] and its employed or contracted physicians, physician assistants,  nurse practitioners or other licensed health care professionals (the Practitioner), to provide me with telemedicine health care services (the "Services") as deemed necessary by the treating Practitioner. I acknowledge and consent to receive the Services by the Practitioner via telemedicine. I understand that the telemedicine visit will involve communicating with the Practitioner through live audiovisual communication technology and the disclosure of certain medical information by electronic transmission. I acknowledge that I have been given the opportunity to request an in-person assessment or other available alternative prior to the telemedicine visit and am voluntarily participating in the telemedicine visit.  I understand that I have the right to withhold or withdraw my consent to the use of telemedicine in the course of my care at any time, without affecting my right to future care or treatment, and that the Practitioner or I may terminate the telemedicine visit at any time. I understand that I have the right to inspect all information obtained and/or recorded in the course of the telemedicine visit and may receive copies of available information for a reasonable fee.  I understand that some of the potential risks of receiving the Services via telemedicine include:  [name] Delay or interruption in medical evaluation due to technological equipment failure or disruption; . Information transmitted may not be sufficient (e.g. poor resolution of images) to allow for appropriate medical decision making by the Practitioner; and/or  . In rare instances, security protocols could fail, causing a breach of personal health information.  Furthermore, I acknowledge that it is my responsibility to provide information about my medical history, conditions and care that is complete and accurate to the best of my ability. I acknowledge that Practitioner's advice, recommendations, and/or decision may be based on factors not  within their control, such as incomplete or inaccurate data provided by me or distortions of diagnostic images or specimens that may result from electronic transmissions. I understand that the practice of medicine is not an exact science and that Practitioner makes no warranties or guarantees regarding treatment outcomes. I acknowledge that I will receive a copy of this consent concurrently upon execution via email to the email address I last provided but may also request a printed copy by calling the office of [facility].    I understand that my insurance will be billed for this visit.   I have read or had this consent read to me. . I understand the contents of this consent, which adequately explains the benefits and risks of the Services being provided via telemedicine.  . I have been provided ample opportunity to ask questions regarding this consent and the Services and have had my questions answered to my satisfaction. . I give my informed consent for the services to be provided through the use of telemedicine in my medical care  By participating in this telemedicine visit I agree to the above.

## 2018-07-11 NOTE — Progress Notes (Addendum)
Virtual Visit via Video Note   This visit type was conducted due to national recommendations for restrictions regarding the COVID-19 Pandemic (e.g. social distancing) in an effort to limit this patient's exposure and mitigate transmission in our community.  Due to his co-morbid illnesses, this patient is at least at moderate risk for complications without adequate follow up.  This format is felt to be most appropriate for this patient at this time.  All issues noted in this document were discussed and addressed.  A limited physical exam was performed with this format.  Please refer to the patient's chart for his consent to telehealth for [facility].   Evaluation Performed:  Follow-up visit  Date:  07/11/2018   ID:  [name] [name], DOB 05/08/1965, MRN [id number]  Patient Location: Home Provider Location: Office  PCP:  [name], MD  Cardiologist:  Sinclair Grooms, MD  Electrophysiologist:  None   Chief Complaint:  CAD/ESRD  History of Present Illness:    [name] [name] is a 53 y.o. male with  hx of non-ischemic cardiomyopathy, ESRD, DM II, and obesity. He has 2 sets of specialist. He has a nephrologist, Dr. Brayton El in [facility]. He is also is being seen by nephrology at [facility] and is on the list for transplantation.[name] He also sees a cardiologist at [facility], Dr. [name]. 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  He is now being seen predominantly at [facility] and being managed by Dr. Earmon Phoenix is had significant difficulty since his last office visit.  Since the last face-to-face visit he has had amputation of 2 digits on the left hand and one on the right hand.  He is unable to voice a consolidating diagnosis.  His understanding is that digital ischemia has been caused by low blood pressure that is idiopathic.  He is having difficulty with healing in the left hand.  He has not had syncope, he  denies chest discomfort, shortness of breath, peripheral edema, and is having no difficulty with dialysis.  Overall he feels a cardiac situation is stable.  The patient does not have symptoms concerning for COVID-19 infection (fever, chills, cough, or new shortness of breath).    Past Medical History:  Diagnosis Date   Anal infection    posterior anal canal   Anemia, chronic renal failure    Atrophic kidney    BILATERAL   DM type 2 causing ESRD ([place])    Nephrologist-- dr [name] [facility])--  on hemodialysis since June 2012 at  [name]  TTS   Hemodialysis patient [facility])    at [place] on Tues/ Thur/Sat/schedule   Hemorrhoids    Hepatitis B antibody positive    History of pleural effusion    bilateral   Hyperparathyroidism, secondary renal ([place])    Hypertension    Ischemic cardiomyopathy    per echo 07-01-2014  ef 45%   LAFB (left anterior fascicular block)    Peripheral neuropathy    Systolic and diastolic CHF, chronic ([place])    CARDIOLOGIST-  DR Daneen Schick ([place])  AND DR [name] ([name])   Past Surgical History:  Procedure Laterality Date   APPENDECTOMY  09-12-2004   laparotomy w/ drainage peritinitis   AV FISTULA PLACEMENT  02-27-2010   right forearm (RADIOCEPHALIC)   AV FISTULA REPAIR  10-30-2010   CARDIOVASCULAR STRESS TEST  10-29-2011   dr Daneen Schick  Low risk scan;  mild perfusion defect seen in the basal inferoseptal, basal inferior and mid inferior regions consistent with an infarct/scar and/or overlying attenuation/  mild to moderate global LVSF,  ef 40-45%   DOBUTAMINE STRESS ECHO  07-23-2012   [name]   abnormal ;  at rest estimated lvef 25-30% and global severe LV hypokinesis ;  no cp during stress and achieved 85% maxium predicted heart rate;  negative stress ECG for inducible ischemia;  estimated lvef with stress 35-40%;  augmentation of wall segments consistant with  cardiomyopathy and differential fibrosis   FISTULOTOMY N/A 03/26/2015   Procedure: FISTULOTOMY;  Surgeon: [name], MD;  Location: [place];  Service: General;  Laterality: N/A;   INCISION AND DRAINAGE ABSCESS N/A 03/26/2015   Procedure: ANAL INCISION AND DRAINAGE;  Surgeon: [name], MD;  Location: [place];  Service: General;  Laterality: N/A;   RETINAL DETACHMENT SURGERY Left 2011   incomplete repair/ needs eye drops to keep pressure down   TEE WITHOUT CARDIOVERSION N/A 10/17/2017   Procedure: TRANSESOPHAGEAL ECHOCARDIOGRAM (TEE);  Surgeon: Josue Hector, MD;  Location: [facility] ENDOSCOPY;  Service: Cardiovascular;  Laterality: N/A;   TRANSTHORACIC ECHOCARDIOGRAM  07-01-2014    done at [facility]   grade 1 diastolic dysfunction,  ef 45%/  trace TR and PR     No outpatient medications have been marked as taking for the 07/12/18 encounter (Appointment) with [name], MD.     Allergies:   Patient has no known allergies.   Social History   Tobacco Use   Smoking status: Never Smoker   Smokeless tobacco: Never Used  Substance Use Topics   Alcohol use: No   Drug use: No     Family Hx: The patient's Family history is unknown by patient.  ROS:   Please see the history of present illness.    Emotionally distraught given the current pandemic, difficulty with the psychological effective digit amputation.  Care is now being done at [facility]. All other systems reviewed and are negative.   Prior CV studies:   The following studies were reviewed today:   No recent cardiac imaging or functional studies.  Labs/Other Tests and Data Reviewed:    EKG:  No ECG reviewed.  Recent Labs: 10/13/2017: ALT 14; TSH 0.224 10/14/2017: BUN 75; Creatinine, Ser 10.73; Hemoglobin 13.3; Platelets 262; Potassium 5.4; Sodium 139    Hemoglobin A1c May 01, 2018 is 10.2; LDL cholesterol 92; TSH 0.224.  Recent  Lipid Panel No results found for: CHOL, TRIG, HDL, CHOLHDL, LDLCALC, LDLDIRECT  Wt Readings from Last 3 Encounters:  10/17/17 237 lb 7 oz (107.7 kg)  10/14/17 237 lb 7 oz (107.7 kg)  10/12/17 243 lb (110.2 kg)     Objective:    Vital Signs:  There were no vitals taken for this visit.   VITAL SIGNS:  reviewed He is obese.  I viewed both hands noting the digit amputations on the left and right hand with relatively poor healing on the left.  No obvious distention of the neck veins.  Respiratory pattern is normal.  No obvious peripheral edema is noted.  ASSESSMENT & PLAN:    1. Chronic systolic heart failure ([place])   2. CKD (chronic kidney disease) stage V requiring chronic dialysis ([place])   3. Essential hypertension   4. Uncontrolled type 2 diabetes mellitus with hyperglycemia ([place])   5. 2019 novel coronavirus disease (COVID-19)   6. Amputation of finger  without complication, initial encounter      COVID-19 Education: The signs and symptoms of COVID-19 were discussed with the patient and how to seek care for testing (follow up with PCP or arrange E-visit).  The importance of social distancing was discussed today.  Time:   Today, I have spent 15 minutes with the patient with telehealth technology discussing the above problems.     Medication Adjustments/Labs and Tests Ordered: Current medicines are reviewed at length with the patient today.  Concerns regarding medicines are outlined above.   Tests Ordered: No orders of the defined types were placed in this encounter.   Medication Changes: No orders of the defined types were placed in this encounter.   Disposition:  Follow up in 8 month(s)  Signed, Sinclair Grooms, MD  07/11/2018 8:56 PM    [facility]

## 2018-07-12 ENCOUNTER — Encounter: Payer: Self-pay | Admitting: Interventional Cardiology

## 2018-07-12 ENCOUNTER — Telehealth (INDEPENDENT_AMBULATORY_CARE_PROVIDER_SITE_OTHER): Payer: Medicare Other | Admitting: Interventional Cardiology

## 2018-07-12 ENCOUNTER — Other Ambulatory Visit: Payer: Self-pay

## 2018-07-12 VITALS — BP 110/70 | HR 76 | Ht 65.0 in | Wt 238.0 lb

## 2018-07-12 DIAGNOSIS — I10 Essential (primary) hypertension: Secondary | ICD-10-CM

## 2018-07-12 DIAGNOSIS — I5022 Chronic systolic (congestive) heart failure: Secondary | ICD-10-CM

## 2018-07-12 DIAGNOSIS — S68119A Complete traumatic metacarpophalangeal amputation of unspecified finger, initial encounter: Secondary | ICD-10-CM

## 2018-07-12 DIAGNOSIS — D631 Anemia in chronic kidney disease: Secondary | ICD-10-CM | POA: Diagnosis not present

## 2018-07-12 DIAGNOSIS — E1122 Type 2 diabetes mellitus with diabetic chronic kidney disease: Secondary | ICD-10-CM | POA: Diagnosis not present

## 2018-07-12 DIAGNOSIS — I509 Heart failure, unspecified: Secondary | ICD-10-CM | POA: Diagnosis not present

## 2018-07-12 DIAGNOSIS — E1165 Type 2 diabetes mellitus with hyperglycemia: Secondary | ICD-10-CM

## 2018-07-12 DIAGNOSIS — Z992 Dependence on renal dialysis: Secondary | ICD-10-CM

## 2018-07-12 DIAGNOSIS — N2581 Secondary hyperparathyroidism of renal origin: Secondary | ICD-10-CM | POA: Diagnosis not present

## 2018-07-12 DIAGNOSIS — N186 End stage renal disease: Secondary | ICD-10-CM | POA: Diagnosis not present

## 2018-07-12 DIAGNOSIS — I132 Hypertensive heart and chronic kidney disease with heart failure and with stage 5 chronic kidney disease, or end stage renal disease: Secondary | ICD-10-CM | POA: Diagnosis not present

## 2018-07-12 NOTE — Patient Instructions (Signed)
Medication Instructions:  Your physician recommends that you continue on your current medications as directed. Please refer to the Current Medication list given to you today.  If you need a refill on your cardiac medications before your next appointment, please call your pharmacy.   Lab work: None If you have labs (blood work) drawn today and your tests are completely normal, you will receive your results only by: [name] MyChart Message (if you have MyChart) OR . A paper copy in the mail If you have any lab test that is abnormal or we need to change your treatment, we will call you to review the results.  Testing/Procedures: None  Follow-Up: At [facility], you and your health needs are our priority.  As part of our continuing mission to provide you with exceptional heart care, we have created designated Provider Care Teams.  These Care Teams include your primary Cardiologist (physician) and Advanced Practice Providers (APPs -  Physician Assistants and Nurse Practitioners) who all work together to provide you with the care you need, when you need it. You will need a follow up appointment in 6-9 months.  Please call our office 2 months in advance to schedule this appointment.  You may see Sinclair Grooms, MD or one of the following Advanced Practice Providers on your designated Care Team:   Truitt Merle, NP Cecilie Kicks, NP . Kathyrn Drown, NP  Any Other Special Instructions Will Be Listed Below (If Applicable).

## 2018-07-13 DIAGNOSIS — N186 End stage renal disease: Secondary | ICD-10-CM | POA: Diagnosis not present

## 2018-07-13 DIAGNOSIS — E785 Hyperlipidemia, unspecified: Secondary | ICD-10-CM | POA: Diagnosis not present

## 2018-07-13 DIAGNOSIS — N2581 Secondary hyperparathyroidism of renal origin: Secondary | ICD-10-CM | POA: Diagnosis not present

## 2018-07-13 DIAGNOSIS — D631 Anemia in chronic kidney disease: Secondary | ICD-10-CM | POA: Diagnosis not present

## 2018-07-15 DIAGNOSIS — E785 Hyperlipidemia, unspecified: Secondary | ICD-10-CM | POA: Diagnosis not present

## 2018-07-15 DIAGNOSIS — N186 End stage renal disease: Secondary | ICD-10-CM | POA: Diagnosis not present

## 2018-07-15 DIAGNOSIS — D631 Anemia in chronic kidney disease: Secondary | ICD-10-CM | POA: Diagnosis not present

## 2018-07-15 DIAGNOSIS — N2581 Secondary hyperparathyroidism of renal origin: Secondary | ICD-10-CM | POA: Diagnosis not present

## 2018-07-18 DIAGNOSIS — E785 Hyperlipidemia, unspecified: Secondary | ICD-10-CM | POA: Diagnosis not present

## 2018-07-18 DIAGNOSIS — D631 Anemia in chronic kidney disease: Secondary | ICD-10-CM | POA: Diagnosis not present

## 2018-07-18 DIAGNOSIS — N2581 Secondary hyperparathyroidism of renal origin: Secondary | ICD-10-CM | POA: Diagnosis not present

## 2018-07-18 DIAGNOSIS — N186 End stage renal disease: Secondary | ICD-10-CM | POA: Diagnosis not present

## 2018-07-19 DIAGNOSIS — I509 Heart failure, unspecified: Secondary | ICD-10-CM | POA: Diagnosis not present

## 2018-07-19 DIAGNOSIS — I132 Hypertensive heart and chronic kidney disease with heart failure and with stage 5 chronic kidney disease, or end stage renal disease: Secondary | ICD-10-CM | POA: Diagnosis not present

## 2018-07-19 DIAGNOSIS — D631 Anemia in chronic kidney disease: Secondary | ICD-10-CM | POA: Diagnosis not present

## 2018-07-19 DIAGNOSIS — N186 End stage renal disease: Secondary | ICD-10-CM | POA: Diagnosis not present

## 2018-07-19 DIAGNOSIS — N2581 Secondary hyperparathyroidism of renal origin: Secondary | ICD-10-CM | POA: Diagnosis not present

## 2018-07-19 DIAGNOSIS — E1122 Type 2 diabetes mellitus with diabetic chronic kidney disease: Secondary | ICD-10-CM | POA: Diagnosis not present

## 2018-07-20 DIAGNOSIS — N2581 Secondary hyperparathyroidism of renal origin: Secondary | ICD-10-CM | POA: Diagnosis not present

## 2018-07-20 DIAGNOSIS — Z992 Dependence on renal dialysis: Secondary | ICD-10-CM | POA: Diagnosis not present

## 2018-07-20 DIAGNOSIS — D631 Anemia in chronic kidney disease: Secondary | ICD-10-CM | POA: Diagnosis not present

## 2018-07-20 DIAGNOSIS — N186 End stage renal disease: Secondary | ICD-10-CM | POA: Diagnosis not present

## 2018-07-20 DIAGNOSIS — E785 Hyperlipidemia, unspecified: Secondary | ICD-10-CM | POA: Diagnosis not present

## 2018-07-22 DIAGNOSIS — N2581 Secondary hyperparathyroidism of renal origin: Secondary | ICD-10-CM | POA: Diagnosis not present

## 2018-07-22 DIAGNOSIS — D509 Iron deficiency anemia, unspecified: Secondary | ICD-10-CM | POA: Diagnosis not present

## 2018-07-22 DIAGNOSIS — D631 Anemia in chronic kidney disease: Secondary | ICD-10-CM | POA: Diagnosis not present

## 2018-07-22 DIAGNOSIS — N186 End stage renal disease: Secondary | ICD-10-CM | POA: Diagnosis not present

## 2018-07-23 DIAGNOSIS — D509 Iron deficiency anemia, unspecified: Secondary | ICD-10-CM | POA: Diagnosis not present

## 2018-07-23 DIAGNOSIS — N186 End stage renal disease: Secondary | ICD-10-CM | POA: Diagnosis not present

## 2018-07-23 DIAGNOSIS — N2581 Secondary hyperparathyroidism of renal origin: Secondary | ICD-10-CM | POA: Diagnosis not present

## 2018-07-23 DIAGNOSIS — D631 Anemia in chronic kidney disease: Secondary | ICD-10-CM | POA: Diagnosis not present

## 2018-07-25 DIAGNOSIS — N2581 Secondary hyperparathyroidism of renal origin: Secondary | ICD-10-CM | POA: Diagnosis not present

## 2018-07-25 DIAGNOSIS — D631 Anemia in chronic kidney disease: Secondary | ICD-10-CM | POA: Diagnosis not present

## 2018-07-25 DIAGNOSIS — D509 Iron deficiency anemia, unspecified: Secondary | ICD-10-CM | POA: Diagnosis not present

## 2018-07-25 DIAGNOSIS — N186 End stage renal disease: Secondary | ICD-10-CM | POA: Diagnosis not present

## 2018-07-26 DIAGNOSIS — N186 End stage renal disease: Secondary | ICD-10-CM | POA: Diagnosis not present

## 2018-07-26 DIAGNOSIS — I132 Hypertensive heart and chronic kidney disease with heart failure and with stage 5 chronic kidney disease, or end stage renal disease: Secondary | ICD-10-CM | POA: Diagnosis not present

## 2018-07-26 DIAGNOSIS — E1122 Type 2 diabetes mellitus with diabetic chronic kidney disease: Secondary | ICD-10-CM | POA: Diagnosis not present

## 2018-07-26 DIAGNOSIS — I509 Heart failure, unspecified: Secondary | ICD-10-CM | POA: Diagnosis not present

## 2018-07-26 DIAGNOSIS — N2581 Secondary hyperparathyroidism of renal origin: Secondary | ICD-10-CM | POA: Diagnosis not present

## 2018-07-26 DIAGNOSIS — D631 Anemia in chronic kidney disease: Secondary | ICD-10-CM | POA: Diagnosis not present

## 2018-07-27 DIAGNOSIS — D631 Anemia in chronic kidney disease: Secondary | ICD-10-CM | POA: Diagnosis not present

## 2018-07-27 DIAGNOSIS — N2581 Secondary hyperparathyroidism of renal origin: Secondary | ICD-10-CM | POA: Diagnosis not present

## 2018-07-27 DIAGNOSIS — N186 End stage renal disease: Secondary | ICD-10-CM | POA: Diagnosis not present

## 2018-07-27 DIAGNOSIS — E119 Type 2 diabetes mellitus without complications: Secondary | ICD-10-CM | POA: Diagnosis not present

## 2018-07-27 DIAGNOSIS — D509 Iron deficiency anemia, unspecified: Secondary | ICD-10-CM | POA: Diagnosis not present

## 2018-07-29 DIAGNOSIS — D509 Iron deficiency anemia, unspecified: Secondary | ICD-10-CM | POA: Diagnosis not present

## 2018-07-29 DIAGNOSIS — D631 Anemia in chronic kidney disease: Secondary | ICD-10-CM | POA: Diagnosis not present

## 2018-07-29 DIAGNOSIS — N186 End stage renal disease: Secondary | ICD-10-CM | POA: Diagnosis not present

## 2018-07-29 DIAGNOSIS — N2581 Secondary hyperparathyroidism of renal origin: Secondary | ICD-10-CM | POA: Diagnosis not present

## 2018-07-31 DIAGNOSIS — M159 Polyosteoarthritis, unspecified: Secondary | ICD-10-CM | POA: Diagnosis not present

## 2018-07-31 DIAGNOSIS — M2550 Pain in unspecified joint: Secondary | ICD-10-CM | POA: Diagnosis not present

## 2018-07-31 DIAGNOSIS — E1140 Type 2 diabetes mellitus with diabetic neuropathy, unspecified: Secondary | ICD-10-CM | POA: Diagnosis not present

## 2018-07-31 DIAGNOSIS — M79643 Pain in unspecified hand: Secondary | ICD-10-CM | POA: Diagnosis not present

## 2018-07-31 DIAGNOSIS — B029 Zoster without complications: Secondary | ICD-10-CM | POA: Diagnosis not present

## 2018-07-31 DIAGNOSIS — Z139 Encounter for screening, unspecified: Secondary | ICD-10-CM | POA: Diagnosis not present

## 2018-07-31 DIAGNOSIS — E7800 Pure hypercholesterolemia, unspecified: Secondary | ICD-10-CM | POA: Diagnosis not present

## 2018-07-31 DIAGNOSIS — N186 End stage renal disease: Secondary | ICD-10-CM | POA: Diagnosis not present

## 2018-07-31 DIAGNOSIS — E875 Hyperkalemia: Secondary | ICD-10-CM | POA: Diagnosis not present

## 2018-07-31 DIAGNOSIS — Z79891 Long term (current) use of opiate analgesic: Secondary | ICD-10-CM | POA: Diagnosis not present

## 2018-07-31 DIAGNOSIS — I5032 Chronic diastolic (congestive) heart failure: Secondary | ICD-10-CM | POA: Diagnosis not present

## 2018-07-31 DIAGNOSIS — Z79899 Other long term (current) drug therapy: Secondary | ICD-10-CM | POA: Diagnosis not present

## 2018-08-01 DIAGNOSIS — N2581 Secondary hyperparathyroidism of renal origin: Secondary | ICD-10-CM | POA: Diagnosis not present

## 2018-08-01 DIAGNOSIS — N186 End stage renal disease: Secondary | ICD-10-CM | POA: Diagnosis not present

## 2018-08-01 DIAGNOSIS — D631 Anemia in chronic kidney disease: Secondary | ICD-10-CM | POA: Diagnosis not present

## 2018-08-01 DIAGNOSIS — D509 Iron deficiency anemia, unspecified: Secondary | ICD-10-CM | POA: Diagnosis not present

## 2018-08-02 DIAGNOSIS — E1122 Type 2 diabetes mellitus with diabetic chronic kidney disease: Secondary | ICD-10-CM | POA: Diagnosis not present

## 2018-08-02 DIAGNOSIS — I509 Heart failure, unspecified: Secondary | ICD-10-CM | POA: Diagnosis not present

## 2018-08-02 DIAGNOSIS — D631 Anemia in chronic kidney disease: Secondary | ICD-10-CM | POA: Diagnosis not present

## 2018-08-02 DIAGNOSIS — N2581 Secondary hyperparathyroidism of renal origin: Secondary | ICD-10-CM | POA: Diagnosis not present

## 2018-08-02 DIAGNOSIS — I132 Hypertensive heart and chronic kidney disease with heart failure and with stage 5 chronic kidney disease, or end stage renal disease: Secondary | ICD-10-CM | POA: Diagnosis not present

## 2018-08-02 DIAGNOSIS — N186 End stage renal disease: Secondary | ICD-10-CM | POA: Diagnosis not present

## 2018-08-03 DIAGNOSIS — N2581 Secondary hyperparathyroidism of renal origin: Secondary | ICD-10-CM | POA: Diagnosis not present

## 2018-08-03 DIAGNOSIS — D509 Iron deficiency anemia, unspecified: Secondary | ICD-10-CM | POA: Diagnosis not present

## 2018-08-03 DIAGNOSIS — D631 Anemia in chronic kidney disease: Secondary | ICD-10-CM | POA: Diagnosis not present

## 2018-08-03 DIAGNOSIS — N186 End stage renal disease: Secondary | ICD-10-CM | POA: Diagnosis not present

## 2018-08-05 DIAGNOSIS — N2581 Secondary hyperparathyroidism of renal origin: Secondary | ICD-10-CM | POA: Diagnosis not present

## 2018-08-05 DIAGNOSIS — N186 End stage renal disease: Secondary | ICD-10-CM | POA: Diagnosis not present

## 2018-08-05 DIAGNOSIS — D631 Anemia in chronic kidney disease: Secondary | ICD-10-CM | POA: Diagnosis not present

## 2018-08-05 DIAGNOSIS — D509 Iron deficiency anemia, unspecified: Secondary | ICD-10-CM | POA: Diagnosis not present

## 2018-08-07 DIAGNOSIS — N186 End stage renal disease: Secondary | ICD-10-CM | POA: Diagnosis not present

## 2018-08-07 DIAGNOSIS — Z6839 Body mass index (BMI) 39.0-39.9, adult: Secondary | ICD-10-CM | POA: Diagnosis not present

## 2018-08-07 DIAGNOSIS — Z89021 Acquired absence of right finger(s): Secondary | ICD-10-CM | POA: Diagnosis not present

## 2018-08-07 DIAGNOSIS — I998 Other disorder of circulatory system: Secondary | ICD-10-CM | POA: Diagnosis not present

## 2018-08-07 DIAGNOSIS — Z7982 Long term (current) use of aspirin: Secondary | ICD-10-CM | POA: Diagnosis not present

## 2018-08-07 DIAGNOSIS — E1122 Type 2 diabetes mellitus with diabetic chronic kidney disease: Secondary | ICD-10-CM | POA: Diagnosis not present

## 2018-08-07 DIAGNOSIS — Z89022 Acquired absence of left finger(s): Secondary | ICD-10-CM | POA: Diagnosis not present

## 2018-08-07 DIAGNOSIS — E1151 Type 2 diabetes mellitus with diabetic peripheral angiopathy without gangrene: Secondary | ICD-10-CM | POA: Diagnosis not present

## 2018-08-07 DIAGNOSIS — Z940 Kidney transplant status: Secondary | ICD-10-CM | POA: Diagnosis not present

## 2018-08-07 DIAGNOSIS — D631 Anemia in chronic kidney disease: Secondary | ICD-10-CM | POA: Diagnosis not present

## 2018-08-07 DIAGNOSIS — N2581 Secondary hyperparathyroidism of renal origin: Secondary | ICD-10-CM | POA: Diagnosis not present

## 2018-08-07 DIAGNOSIS — I509 Heart failure, unspecified: Secondary | ICD-10-CM | POA: Diagnosis not present

## 2018-08-07 DIAGNOSIS — Z794 Long term (current) use of insulin: Secondary | ICD-10-CM | POA: Diagnosis not present

## 2018-08-07 DIAGNOSIS — I132 Hypertensive heart and chronic kidney disease with heart failure and with stage 5 chronic kidney disease, or end stage renal disease: Secondary | ICD-10-CM | POA: Diagnosis not present

## 2018-08-07 DIAGNOSIS — E669 Obesity, unspecified: Secondary | ICD-10-CM | POA: Diagnosis not present

## 2018-08-07 DIAGNOSIS — Z9181 History of falling: Secondary | ICD-10-CM | POA: Diagnosis not present

## 2018-08-07 DIAGNOSIS — Z992 Dependence on renal dialysis: Secondary | ICD-10-CM | POA: Diagnosis not present

## 2018-08-07 DIAGNOSIS — T8189XA Other complications of procedures, not elsewhere classified, initial encounter: Secondary | ICD-10-CM | POA: Diagnosis not present

## 2018-08-08 DIAGNOSIS — D631 Anemia in chronic kidney disease: Secondary | ICD-10-CM | POA: Diagnosis not present

## 2018-08-08 DIAGNOSIS — D509 Iron deficiency anemia, unspecified: Secondary | ICD-10-CM | POA: Diagnosis not present

## 2018-08-08 DIAGNOSIS — N2581 Secondary hyperparathyroidism of renal origin: Secondary | ICD-10-CM | POA: Diagnosis not present

## 2018-08-08 DIAGNOSIS — N186 End stage renal disease: Secondary | ICD-10-CM | POA: Diagnosis not present

## 2018-08-09 DIAGNOSIS — E1122 Type 2 diabetes mellitus with diabetic chronic kidney disease: Secondary | ICD-10-CM | POA: Diagnosis not present

## 2018-08-09 DIAGNOSIS — N186 End stage renal disease: Secondary | ICD-10-CM | POA: Diagnosis not present

## 2018-08-09 DIAGNOSIS — N2581 Secondary hyperparathyroidism of renal origin: Secondary | ICD-10-CM | POA: Diagnosis not present

## 2018-08-09 DIAGNOSIS — I509 Heart failure, unspecified: Secondary | ICD-10-CM | POA: Diagnosis not present

## 2018-08-09 DIAGNOSIS — D631 Anemia in chronic kidney disease: Secondary | ICD-10-CM | POA: Diagnosis not present

## 2018-08-09 DIAGNOSIS — I132 Hypertensive heart and chronic kidney disease with heart failure and with stage 5 chronic kidney disease, or end stage renal disease: Secondary | ICD-10-CM | POA: Diagnosis not present

## 2018-08-10 DIAGNOSIS — D509 Iron deficiency anemia, unspecified: Secondary | ICD-10-CM | POA: Diagnosis not present

## 2018-08-10 DIAGNOSIS — N186 End stage renal disease: Secondary | ICD-10-CM | POA: Diagnosis not present

## 2018-08-10 DIAGNOSIS — D631 Anemia in chronic kidney disease: Secondary | ICD-10-CM | POA: Diagnosis not present

## 2018-08-10 DIAGNOSIS — N2581 Secondary hyperparathyroidism of renal origin: Secondary | ICD-10-CM | POA: Diagnosis not present

## 2018-08-12 DIAGNOSIS — D631 Anemia in chronic kidney disease: Secondary | ICD-10-CM | POA: Diagnosis not present

## 2018-08-12 DIAGNOSIS — N186 End stage renal disease: Secondary | ICD-10-CM | POA: Diagnosis not present

## 2018-08-12 DIAGNOSIS — D509 Iron deficiency anemia, unspecified: Secondary | ICD-10-CM | POA: Diagnosis not present

## 2018-08-12 DIAGNOSIS — N2581 Secondary hyperparathyroidism of renal origin: Secondary | ICD-10-CM | POA: Diagnosis not present

## 2018-08-15 DIAGNOSIS — D509 Iron deficiency anemia, unspecified: Secondary | ICD-10-CM | POA: Diagnosis not present

## 2018-08-15 DIAGNOSIS — N2581 Secondary hyperparathyroidism of renal origin: Secondary | ICD-10-CM | POA: Diagnosis not present

## 2018-08-15 DIAGNOSIS — N186 End stage renal disease: Secondary | ICD-10-CM | POA: Diagnosis not present

## 2018-08-15 DIAGNOSIS — D631 Anemia in chronic kidney disease: Secondary | ICD-10-CM | POA: Diagnosis not present

## 2018-08-17 DIAGNOSIS — N186 End stage renal disease: Secondary | ICD-10-CM | POA: Diagnosis not present

## 2018-08-17 DIAGNOSIS — N2581 Secondary hyperparathyroidism of renal origin: Secondary | ICD-10-CM | POA: Diagnosis not present

## 2018-08-17 DIAGNOSIS — D509 Iron deficiency anemia, unspecified: Secondary | ICD-10-CM | POA: Diagnosis not present

## 2018-08-17 DIAGNOSIS — I998 Other disorder of circulatory system: Secondary | ICD-10-CM | POA: Diagnosis not present

## 2018-08-17 DIAGNOSIS — L03011 Cellulitis of right finger: Secondary | ICD-10-CM | POA: Diagnosis not present

## 2018-08-17 DIAGNOSIS — D631 Anemia in chronic kidney disease: Secondary | ICD-10-CM | POA: Diagnosis not present

## 2018-08-17 DIAGNOSIS — L03012 Cellulitis of left finger: Secondary | ICD-10-CM | POA: Diagnosis not present

## 2018-08-19 DIAGNOSIS — D509 Iron deficiency anemia, unspecified: Secondary | ICD-10-CM | POA: Diagnosis not present

## 2018-08-19 DIAGNOSIS — N2581 Secondary hyperparathyroidism of renal origin: Secondary | ICD-10-CM | POA: Diagnosis not present

## 2018-08-19 DIAGNOSIS — D631 Anemia in chronic kidney disease: Secondary | ICD-10-CM | POA: Diagnosis not present

## 2018-08-19 DIAGNOSIS — N186 End stage renal disease: Secondary | ICD-10-CM | POA: Diagnosis not present

## 2018-08-20 DIAGNOSIS — N186 End stage renal disease: Secondary | ICD-10-CM | POA: Diagnosis not present

## 2018-08-20 DIAGNOSIS — Z992 Dependence on renal dialysis: Secondary | ICD-10-CM | POA: Diagnosis not present

## 2018-08-21 DIAGNOSIS — I823 Embolism and thrombosis of renal vein: Secondary | ICD-10-CM | POA: Diagnosis not present

## 2018-08-21 DIAGNOSIS — D509 Iron deficiency anemia, unspecified: Secondary | ICD-10-CM | POA: Diagnosis not present

## 2018-08-21 DIAGNOSIS — N2581 Secondary hyperparathyroidism of renal origin: Secondary | ICD-10-CM | POA: Diagnosis not present

## 2018-08-21 DIAGNOSIS — I130 Hypertensive heart and chronic kidney disease with heart failure and stage 1 through stage 4 chronic kidney disease, or unspecified chronic kidney disease: Secondary | ICD-10-CM | POA: Diagnosis not present

## 2018-08-21 DIAGNOSIS — E0800 Diabetes mellitus due to underlying condition with hyperosmolarity without nonketotic hyperglycemic-hyperosmolar coma (NKHHC): Secondary | ICD-10-CM | POA: Diagnosis not present

## 2018-08-21 DIAGNOSIS — I950 Idiopathic hypotension: Secondary | ICD-10-CM | POA: Diagnosis not present

## 2018-08-21 DIAGNOSIS — Z1159 Encounter for screening for other viral diseases: Secondary | ICD-10-CM | POA: Diagnosis not present

## 2018-08-21 DIAGNOSIS — T8619 Other complication of kidney transplant: Secondary | ICD-10-CM | POA: Diagnosis not present

## 2018-08-21 DIAGNOSIS — N186 End stage renal disease: Secondary | ICD-10-CM | POA: Diagnosis not present

## 2018-08-21 DIAGNOSIS — Z01818 Encounter for other preprocedural examination: Secondary | ICD-10-CM | POA: Diagnosis not present

## 2018-08-21 DIAGNOSIS — D631 Anemia in chronic kidney disease: Secondary | ICD-10-CM | POA: Diagnosis not present

## 2018-08-21 DIAGNOSIS — I5020 Unspecified systolic (congestive) heart failure: Secondary | ICD-10-CM | POA: Diagnosis not present

## 2018-08-22 DIAGNOSIS — D631 Anemia in chronic kidney disease: Secondary | ICD-10-CM | POA: Diagnosis not present

## 2018-08-22 DIAGNOSIS — N2581 Secondary hyperparathyroidism of renal origin: Secondary | ICD-10-CM | POA: Diagnosis not present

## 2018-08-22 DIAGNOSIS — D509 Iron deficiency anemia, unspecified: Secondary | ICD-10-CM | POA: Diagnosis not present

## 2018-08-22 DIAGNOSIS — N186 End stage renal disease: Secondary | ICD-10-CM | POA: Diagnosis not present

## 2018-08-23 DIAGNOSIS — E669 Obesity, unspecified: Secondary | ICD-10-CM | POA: Diagnosis not present

## 2018-08-23 DIAGNOSIS — I10 Essential (primary) hypertension: Secondary | ICD-10-CM | POA: Diagnosis not present

## 2018-08-23 DIAGNOSIS — N186 End stage renal disease: Secondary | ICD-10-CM | POA: Diagnosis not present

## 2018-08-23 DIAGNOSIS — E1122 Type 2 diabetes mellitus with diabetic chronic kidney disease: Secondary | ICD-10-CM | POA: Diagnosis not present

## 2018-08-23 DIAGNOSIS — E1151 Type 2 diabetes mellitus with diabetic peripheral angiopathy without gangrene: Secondary | ICD-10-CM | POA: Diagnosis not present

## 2018-08-23 DIAGNOSIS — E1165 Type 2 diabetes mellitus with hyperglycemia: Secondary | ICD-10-CM | POA: Diagnosis not present

## 2018-08-23 DIAGNOSIS — Z1159 Encounter for screening for other viral diseases: Secondary | ICD-10-CM | POA: Diagnosis not present

## 2018-08-23 DIAGNOSIS — I998 Other disorder of circulatory system: Secondary | ICD-10-CM | POA: Diagnosis not present

## 2018-08-23 DIAGNOSIS — I5030 Unspecified diastolic (congestive) heart failure: Secondary | ICD-10-CM | POA: Diagnosis not present

## 2018-08-23 DIAGNOSIS — Z6841 Body Mass Index (BMI) 40.0 and over, adult: Secondary | ICD-10-CM | POA: Diagnosis not present

## 2018-08-23 DIAGNOSIS — Z992 Dependence on renal dialysis: Secondary | ICD-10-CM | POA: Diagnosis not present

## 2018-08-23 DIAGNOSIS — I132 Hypertensive heart and chronic kidney disease with heart failure and with stage 5 chronic kidney disease, or end stage renal disease: Secondary | ICD-10-CM | POA: Diagnosis not present

## 2018-08-23 DIAGNOSIS — E1142 Type 2 diabetes mellitus with diabetic polyneuropathy: Secondary | ICD-10-CM | POA: Diagnosis not present

## 2018-08-23 DIAGNOSIS — I9589 Other hypotension: Secondary | ICD-10-CM | POA: Diagnosis not present

## 2018-08-23 DIAGNOSIS — Z791 Long term (current) use of non-steroidal anti-inflammatories (NSAID): Secondary | ICD-10-CM | POA: Diagnosis not present

## 2018-08-23 DIAGNOSIS — G2581 Restless legs syndrome: Secondary | ICD-10-CM | POA: Diagnosis not present

## 2018-08-23 DIAGNOSIS — D649 Anemia, unspecified: Secondary | ICD-10-CM | POA: Diagnosis not present

## 2018-08-23 DIAGNOSIS — K219 Gastro-esophageal reflux disease without esophagitis: Secondary | ICD-10-CM | POA: Diagnosis not present

## 2018-08-23 DIAGNOSIS — G8929 Other chronic pain: Secondary | ICD-10-CM | POA: Diagnosis not present

## 2018-08-23 DIAGNOSIS — I96 Gangrene, not elsewhere classified: Secondary | ICD-10-CM | POA: Diagnosis not present

## 2018-08-23 DIAGNOSIS — Z7982 Long term (current) use of aspirin: Secondary | ICD-10-CM | POA: Diagnosis not present

## 2018-08-23 DIAGNOSIS — Z9889 Other specified postprocedural states: Secondary | ICD-10-CM | POA: Diagnosis not present

## 2018-08-23 DIAGNOSIS — Z794 Long term (current) use of insulin: Secondary | ICD-10-CM | POA: Diagnosis not present

## 2018-08-23 DIAGNOSIS — Z79899 Other long term (current) drug therapy: Secondary | ICD-10-CM | POA: Diagnosis not present

## 2018-08-23 DIAGNOSIS — I429 Cardiomyopathy, unspecified: Secondary | ICD-10-CM | POA: Diagnosis not present

## 2018-08-23 DIAGNOSIS — Z940 Kidney transplant status: Secondary | ICD-10-CM | POA: Diagnosis not present

## 2018-08-23 DIAGNOSIS — N2581 Secondary hyperparathyroidism of renal origin: Secondary | ICD-10-CM | POA: Diagnosis not present

## 2018-08-23 DIAGNOSIS — M86142 Other acute osteomyelitis, left hand: Secondary | ICD-10-CM | POA: Diagnosis not present

## 2018-08-26 DIAGNOSIS — E119 Type 2 diabetes mellitus without complications: Secondary | ICD-10-CM | POA: Diagnosis not present

## 2018-08-26 DIAGNOSIS — D509 Iron deficiency anemia, unspecified: Secondary | ICD-10-CM | POA: Diagnosis not present

## 2018-08-26 DIAGNOSIS — N186 End stage renal disease: Secondary | ICD-10-CM | POA: Diagnosis not present

## 2018-08-26 DIAGNOSIS — N2581 Secondary hyperparathyroidism of renal origin: Secondary | ICD-10-CM | POA: Diagnosis not present

## 2018-08-26 DIAGNOSIS — D631 Anemia in chronic kidney disease: Secondary | ICD-10-CM | POA: Diagnosis not present

## 2018-08-29 DIAGNOSIS — D509 Iron deficiency anemia, unspecified: Secondary | ICD-10-CM | POA: Diagnosis not present

## 2018-08-29 DIAGNOSIS — N2581 Secondary hyperparathyroidism of renal origin: Secondary | ICD-10-CM | POA: Diagnosis not present

## 2018-08-29 DIAGNOSIS — D631 Anemia in chronic kidney disease: Secondary | ICD-10-CM | POA: Diagnosis not present

## 2018-08-29 DIAGNOSIS — N186 End stage renal disease: Secondary | ICD-10-CM | POA: Diagnosis not present

## 2018-08-31 DIAGNOSIS — N2581 Secondary hyperparathyroidism of renal origin: Secondary | ICD-10-CM | POA: Diagnosis not present

## 2018-08-31 DIAGNOSIS — D631 Anemia in chronic kidney disease: Secondary | ICD-10-CM | POA: Diagnosis not present

## 2018-08-31 DIAGNOSIS — N186 End stage renal disease: Secondary | ICD-10-CM | POA: Diagnosis not present

## 2018-08-31 DIAGNOSIS — D509 Iron deficiency anemia, unspecified: Secondary | ICD-10-CM | POA: Diagnosis not present

## 2018-09-02 DIAGNOSIS — N186 End stage renal disease: Secondary | ICD-10-CM | POA: Diagnosis not present

## 2018-09-02 DIAGNOSIS — D509 Iron deficiency anemia, unspecified: Secondary | ICD-10-CM | POA: Diagnosis not present

## 2018-09-02 DIAGNOSIS — N2581 Secondary hyperparathyroidism of renal origin: Secondary | ICD-10-CM | POA: Diagnosis not present

## 2018-09-02 DIAGNOSIS — D631 Anemia in chronic kidney disease: Secondary | ICD-10-CM | POA: Diagnosis not present

## 2018-09-05 DIAGNOSIS — N2581 Secondary hyperparathyroidism of renal origin: Secondary | ICD-10-CM | POA: Diagnosis not present

## 2018-09-05 DIAGNOSIS — D631 Anemia in chronic kidney disease: Secondary | ICD-10-CM | POA: Diagnosis not present

## 2018-09-05 DIAGNOSIS — N186 End stage renal disease: Secondary | ICD-10-CM | POA: Diagnosis not present

## 2018-09-05 DIAGNOSIS — D509 Iron deficiency anemia, unspecified: Secondary | ICD-10-CM | POA: Diagnosis not present

## 2018-09-07 DIAGNOSIS — D631 Anemia in chronic kidney disease: Secondary | ICD-10-CM | POA: Diagnosis not present

## 2018-09-07 DIAGNOSIS — N186 End stage renal disease: Secondary | ICD-10-CM | POA: Diagnosis not present

## 2018-09-07 DIAGNOSIS — N2581 Secondary hyperparathyroidism of renal origin: Secondary | ICD-10-CM | POA: Diagnosis not present

## 2018-09-07 DIAGNOSIS — D509 Iron deficiency anemia, unspecified: Secondary | ICD-10-CM | POA: Diagnosis not present

## 2018-09-09 DIAGNOSIS — D509 Iron deficiency anemia, unspecified: Secondary | ICD-10-CM | POA: Diagnosis not present

## 2018-09-09 DIAGNOSIS — D631 Anemia in chronic kidney disease: Secondary | ICD-10-CM | POA: Diagnosis not present

## 2018-09-09 DIAGNOSIS — N186 End stage renal disease: Secondary | ICD-10-CM | POA: Diagnosis not present

## 2018-09-09 DIAGNOSIS — N2581 Secondary hyperparathyroidism of renal origin: Secondary | ICD-10-CM | POA: Diagnosis not present

## 2018-09-12 DIAGNOSIS — D509 Iron deficiency anemia, unspecified: Secondary | ICD-10-CM | POA: Diagnosis not present

## 2018-09-12 DIAGNOSIS — D631 Anemia in chronic kidney disease: Secondary | ICD-10-CM | POA: Diagnosis not present

## 2018-09-12 DIAGNOSIS — N186 End stage renal disease: Secondary | ICD-10-CM | POA: Diagnosis not present

## 2018-09-12 DIAGNOSIS — N2581 Secondary hyperparathyroidism of renal origin: Secondary | ICD-10-CM | POA: Diagnosis not present

## 2018-09-14 DIAGNOSIS — N2581 Secondary hyperparathyroidism of renal origin: Secondary | ICD-10-CM | POA: Diagnosis not present

## 2018-09-14 DIAGNOSIS — N186 End stage renal disease: Secondary | ICD-10-CM | POA: Diagnosis not present

## 2018-09-14 DIAGNOSIS — D509 Iron deficiency anemia, unspecified: Secondary | ICD-10-CM | POA: Diagnosis not present

## 2018-09-14 DIAGNOSIS — D631 Anemia in chronic kidney disease: Secondary | ICD-10-CM | POA: Diagnosis not present

## 2018-09-15 DIAGNOSIS — I5032 Chronic diastolic (congestive) heart failure: Secondary | ICD-10-CM | POA: Diagnosis present

## 2018-09-15 DIAGNOSIS — E1169 Type 2 diabetes mellitus with other specified complication: Secondary | ICD-10-CM | POA: Diagnosis present

## 2018-09-15 DIAGNOSIS — Z1159 Encounter for screening for other viral diseases: Secondary | ICD-10-CM | POA: Diagnosis not present

## 2018-09-15 DIAGNOSIS — M868X4 Other osteomyelitis, hand: Secondary | ICD-10-CM | POA: Diagnosis present

## 2018-09-15 DIAGNOSIS — I998 Other disorder of circulatory system: Secondary | ICD-10-CM | POA: Diagnosis not present

## 2018-09-15 DIAGNOSIS — N250 Renal osteodystrophy: Secondary | ICD-10-CM | POA: Diagnosis present

## 2018-09-15 DIAGNOSIS — I132 Hypertensive heart and chronic kidney disease with heart failure and with stage 5 chronic kidney disease, or end stage renal disease: Secondary | ICD-10-CM | POA: Diagnosis present

## 2018-09-15 DIAGNOSIS — N186 End stage renal disease: Secondary | ICD-10-CM | POA: Diagnosis not present

## 2018-09-15 DIAGNOSIS — D649 Anemia, unspecified: Secondary | ICD-10-CM | POA: Diagnosis present

## 2018-09-15 DIAGNOSIS — N2581 Secondary hyperparathyroidism of renal origin: Secondary | ICD-10-CM | POA: Diagnosis present

## 2018-09-15 DIAGNOSIS — E1122 Type 2 diabetes mellitus with diabetic chronic kidney disease: Secondary | ICD-10-CM | POA: Diagnosis present

## 2018-09-15 DIAGNOSIS — E1152 Type 2 diabetes mellitus with diabetic peripheral angiopathy with gangrene: Secondary | ICD-10-CM | POA: Diagnosis present

## 2018-09-15 DIAGNOSIS — I428 Other cardiomyopathies: Secondary | ICD-10-CM | POA: Diagnosis present

## 2018-09-15 DIAGNOSIS — I96 Gangrene, not elsewhere classified: Secondary | ICD-10-CM | POA: Diagnosis not present

## 2018-09-15 DIAGNOSIS — T8741 Infection of amputation stump, right upper extremity: Secondary | ICD-10-CM | POA: Diagnosis not present

## 2018-09-15 DIAGNOSIS — Z992 Dependence on renal dialysis: Secondary | ICD-10-CM | POA: Diagnosis not present

## 2018-09-15 DIAGNOSIS — L089 Local infection of the skin and subcutaneous tissue, unspecified: Secondary | ICD-10-CM | POA: Insufficient documentation

## 2018-09-19 DIAGNOSIS — D631 Anemia in chronic kidney disease: Secondary | ICD-10-CM | POA: Diagnosis not present

## 2018-09-19 DIAGNOSIS — D509 Iron deficiency anemia, unspecified: Secondary | ICD-10-CM | POA: Diagnosis not present

## 2018-09-19 DIAGNOSIS — Z992 Dependence on renal dialysis: Secondary | ICD-10-CM | POA: Diagnosis not present

## 2018-09-19 DIAGNOSIS — N186 End stage renal disease: Secondary | ICD-10-CM | POA: Diagnosis not present

## 2018-09-19 DIAGNOSIS — N2581 Secondary hyperparathyroidism of renal origin: Secondary | ICD-10-CM | POA: Diagnosis not present

## 2018-09-20 DIAGNOSIS — D631 Anemia in chronic kidney disease: Secondary | ICD-10-CM | POA: Diagnosis not present

## 2018-09-20 DIAGNOSIS — E785 Hyperlipidemia, unspecified: Secondary | ICD-10-CM | POA: Diagnosis not present

## 2018-09-20 DIAGNOSIS — N186 End stage renal disease: Secondary | ICD-10-CM | POA: Diagnosis not present

## 2018-09-20 DIAGNOSIS — N2581 Secondary hyperparathyroidism of renal origin: Secondary | ICD-10-CM | POA: Diagnosis not present

## 2018-09-21 DIAGNOSIS — N186 End stage renal disease: Secondary | ICD-10-CM | POA: Diagnosis not present

## 2018-09-21 DIAGNOSIS — E785 Hyperlipidemia, unspecified: Secondary | ICD-10-CM | POA: Diagnosis not present

## 2018-09-21 DIAGNOSIS — N2581 Secondary hyperparathyroidism of renal origin: Secondary | ICD-10-CM | POA: Diagnosis not present

## 2018-09-21 DIAGNOSIS — D631 Anemia in chronic kidney disease: Secondary | ICD-10-CM | POA: Diagnosis not present

## 2018-09-23 DIAGNOSIS — N186 End stage renal disease: Secondary | ICD-10-CM | POA: Diagnosis not present

## 2018-09-23 DIAGNOSIS — E785 Hyperlipidemia, unspecified: Secondary | ICD-10-CM | POA: Diagnosis not present

## 2018-09-23 DIAGNOSIS — N2581 Secondary hyperparathyroidism of renal origin: Secondary | ICD-10-CM | POA: Diagnosis not present

## 2018-09-23 DIAGNOSIS — D631 Anemia in chronic kidney disease: Secondary | ICD-10-CM | POA: Diagnosis not present

## 2018-09-25 DIAGNOSIS — M159 Polyosteoarthritis, unspecified: Secondary | ICD-10-CM | POA: Diagnosis not present

## 2018-09-25 DIAGNOSIS — I739 Peripheral vascular disease, unspecified: Secondary | ICD-10-CM | POA: Diagnosis not present

## 2018-09-25 DIAGNOSIS — I809 Phlebitis and thrombophlebitis of unspecified site: Secondary | ICD-10-CM | POA: Diagnosis not present

## 2018-09-25 DIAGNOSIS — G5690 Unspecified mononeuropathy of unspecified upper limb: Secondary | ICD-10-CM | POA: Diagnosis not present

## 2018-09-25 DIAGNOSIS — N186 End stage renal disease: Secondary | ICD-10-CM | POA: Diagnosis not present

## 2018-09-25 DIAGNOSIS — E7800 Pure hypercholesterolemia, unspecified: Secondary | ICD-10-CM | POA: Diagnosis not present

## 2018-09-25 DIAGNOSIS — Z79891 Long term (current) use of opiate analgesic: Secondary | ICD-10-CM | POA: Diagnosis not present

## 2018-09-25 DIAGNOSIS — E875 Hyperkalemia: Secondary | ICD-10-CM | POA: Diagnosis not present

## 2018-09-25 DIAGNOSIS — Z79899 Other long term (current) drug therapy: Secondary | ICD-10-CM | POA: Diagnosis not present

## 2018-09-25 DIAGNOSIS — E1140 Type 2 diabetes mellitus with diabetic neuropathy, unspecified: Secondary | ICD-10-CM | POA: Diagnosis not present

## 2018-09-25 DIAGNOSIS — Z139 Encounter for screening, unspecified: Secondary | ICD-10-CM | POA: Diagnosis not present

## 2018-09-25 DIAGNOSIS — I5032 Chronic diastolic (congestive) heart failure: Secondary | ICD-10-CM | POA: Diagnosis not present

## 2018-09-26 DIAGNOSIS — D631 Anemia in chronic kidney disease: Secondary | ICD-10-CM | POA: Diagnosis not present

## 2018-09-26 DIAGNOSIS — E785 Hyperlipidemia, unspecified: Secondary | ICD-10-CM | POA: Diagnosis not present

## 2018-09-26 DIAGNOSIS — N2581 Secondary hyperparathyroidism of renal origin: Secondary | ICD-10-CM | POA: Diagnosis not present

## 2018-09-26 DIAGNOSIS — N186 End stage renal disease: Secondary | ICD-10-CM | POA: Diagnosis not present

## 2018-09-27 DIAGNOSIS — I809 Phlebitis and thrombophlebitis of unspecified site: Secondary | ICD-10-CM | POA: Diagnosis not present

## 2018-09-27 DIAGNOSIS — Z79899 Other long term (current) drug therapy: Secondary | ICD-10-CM | POA: Diagnosis not present

## 2018-09-27 DIAGNOSIS — E7800 Pure hypercholesterolemia, unspecified: Secondary | ICD-10-CM | POA: Diagnosis not present

## 2018-09-27 DIAGNOSIS — Z139 Encounter for screening, unspecified: Secondary | ICD-10-CM | POA: Diagnosis not present

## 2018-09-28 DIAGNOSIS — E1121 Type 2 diabetes mellitus with diabetic nephropathy: Secondary | ICD-10-CM | POA: Diagnosis not present

## 2018-09-28 DIAGNOSIS — N186 End stage renal disease: Secondary | ICD-10-CM | POA: Diagnosis not present

## 2018-09-28 DIAGNOSIS — D631 Anemia in chronic kidney disease: Secondary | ICD-10-CM | POA: Diagnosis not present

## 2018-09-28 DIAGNOSIS — E785 Hyperlipidemia, unspecified: Secondary | ICD-10-CM | POA: Diagnosis not present

## 2018-09-28 DIAGNOSIS — N2581 Secondary hyperparathyroidism of renal origin: Secondary | ICD-10-CM | POA: Diagnosis not present

## 2018-09-28 DIAGNOSIS — E119 Type 2 diabetes mellitus without complications: Secondary | ICD-10-CM | POA: Diagnosis not present

## 2018-09-30 DIAGNOSIS — N2581 Secondary hyperparathyroidism of renal origin: Secondary | ICD-10-CM | POA: Diagnosis not present

## 2018-09-30 DIAGNOSIS — D631 Anemia in chronic kidney disease: Secondary | ICD-10-CM | POA: Diagnosis not present

## 2018-09-30 DIAGNOSIS — N186 End stage renal disease: Secondary | ICD-10-CM | POA: Diagnosis not present

## 2018-09-30 DIAGNOSIS — E785 Hyperlipidemia, unspecified: Secondary | ICD-10-CM | POA: Diagnosis not present

## 2018-10-03 DIAGNOSIS — N186 End stage renal disease: Secondary | ICD-10-CM | POA: Diagnosis not present

## 2018-10-03 DIAGNOSIS — D631 Anemia in chronic kidney disease: Secondary | ICD-10-CM | POA: Diagnosis not present

## 2018-10-03 DIAGNOSIS — N2581 Secondary hyperparathyroidism of renal origin: Secondary | ICD-10-CM | POA: Diagnosis not present

## 2018-10-03 DIAGNOSIS — E785 Hyperlipidemia, unspecified: Secondary | ICD-10-CM | POA: Diagnosis not present

## 2018-10-05 DIAGNOSIS — N186 End stage renal disease: Secondary | ICD-10-CM | POA: Diagnosis not present

## 2018-10-05 DIAGNOSIS — D631 Anemia in chronic kidney disease: Secondary | ICD-10-CM | POA: Diagnosis not present

## 2018-10-05 DIAGNOSIS — E785 Hyperlipidemia, unspecified: Secondary | ICD-10-CM | POA: Diagnosis not present

## 2018-10-05 DIAGNOSIS — N2581 Secondary hyperparathyroidism of renal origin: Secondary | ICD-10-CM | POA: Diagnosis not present

## 2018-10-07 DIAGNOSIS — N2581 Secondary hyperparathyroidism of renal origin: Secondary | ICD-10-CM | POA: Diagnosis not present

## 2018-10-07 DIAGNOSIS — D631 Anemia in chronic kidney disease: Secondary | ICD-10-CM | POA: Diagnosis not present

## 2018-10-07 DIAGNOSIS — E785 Hyperlipidemia, unspecified: Secondary | ICD-10-CM | POA: Diagnosis not present

## 2018-10-07 DIAGNOSIS — N186 End stage renal disease: Secondary | ICD-10-CM | POA: Diagnosis not present

## 2018-10-10 DIAGNOSIS — N186 End stage renal disease: Secondary | ICD-10-CM | POA: Diagnosis not present

## 2018-10-10 DIAGNOSIS — N2581 Secondary hyperparathyroidism of renal origin: Secondary | ICD-10-CM | POA: Diagnosis not present

## 2018-10-10 DIAGNOSIS — E785 Hyperlipidemia, unspecified: Secondary | ICD-10-CM | POA: Diagnosis not present

## 2018-10-10 DIAGNOSIS — D631 Anemia in chronic kidney disease: Secondary | ICD-10-CM | POA: Diagnosis not present

## 2018-10-12 DIAGNOSIS — N186 End stage renal disease: Secondary | ICD-10-CM | POA: Diagnosis not present

## 2018-10-12 DIAGNOSIS — N2581 Secondary hyperparathyroidism of renal origin: Secondary | ICD-10-CM | POA: Diagnosis not present

## 2018-10-12 DIAGNOSIS — E785 Hyperlipidemia, unspecified: Secondary | ICD-10-CM | POA: Diagnosis not present

## 2018-10-12 DIAGNOSIS — D631 Anemia in chronic kidney disease: Secondary | ICD-10-CM | POA: Diagnosis not present

## 2018-10-14 DIAGNOSIS — E785 Hyperlipidemia, unspecified: Secondary | ICD-10-CM | POA: Diagnosis not present

## 2018-10-14 DIAGNOSIS — N2581 Secondary hyperparathyroidism of renal origin: Secondary | ICD-10-CM | POA: Diagnosis not present

## 2018-10-14 DIAGNOSIS — D631 Anemia in chronic kidney disease: Secondary | ICD-10-CM | POA: Diagnosis not present

## 2018-10-14 DIAGNOSIS — N186 End stage renal disease: Secondary | ICD-10-CM | POA: Diagnosis not present

## 2018-10-16 DIAGNOSIS — Z01818 Encounter for other preprocedural examination: Secondary | ICD-10-CM | POA: Diagnosis not present

## 2018-10-16 DIAGNOSIS — E0800 Diabetes mellitus due to underlying condition with hyperosmolarity without nonketotic hyperglycemic-hyperosmolar coma (NKHHC): Secondary | ICD-10-CM | POA: Diagnosis not present

## 2018-10-16 DIAGNOSIS — N2581 Secondary hyperparathyroidism of renal origin: Secondary | ICD-10-CM | POA: Diagnosis not present

## 2018-10-16 DIAGNOSIS — E1122 Type 2 diabetes mellitus with diabetic chronic kidney disease: Secondary | ICD-10-CM | POA: Diagnosis not present

## 2018-10-16 DIAGNOSIS — Z6841 Body Mass Index (BMI) 40.0 and over, adult: Secondary | ICD-10-CM | POA: Diagnosis not present

## 2018-10-16 DIAGNOSIS — I950 Idiopathic hypotension: Secondary | ICD-10-CM | POA: Diagnosis not present

## 2018-10-16 DIAGNOSIS — N186 End stage renal disease: Secondary | ICD-10-CM | POA: Diagnosis not present

## 2018-10-16 DIAGNOSIS — Z1159 Encounter for screening for other viral diseases: Secondary | ICD-10-CM | POA: Diagnosis not present

## 2018-10-17 DIAGNOSIS — E785 Hyperlipidemia, unspecified: Secondary | ICD-10-CM | POA: Diagnosis not present

## 2018-10-17 DIAGNOSIS — N186 End stage renal disease: Secondary | ICD-10-CM | POA: Diagnosis not present

## 2018-10-17 DIAGNOSIS — D631 Anemia in chronic kidney disease: Secondary | ICD-10-CM | POA: Diagnosis not present

## 2018-10-17 DIAGNOSIS — N2581 Secondary hyperparathyroidism of renal origin: Secondary | ICD-10-CM | POA: Diagnosis not present

## 2018-10-18 DIAGNOSIS — E1152 Type 2 diabetes mellitus with diabetic peripheral angiopathy with gangrene: Secondary | ICD-10-CM | POA: Diagnosis not present

## 2018-10-18 DIAGNOSIS — T8612 Kidney transplant failure: Secondary | ICD-10-CM | POA: Diagnosis not present

## 2018-10-18 DIAGNOSIS — I509 Heart failure, unspecified: Secondary | ICD-10-CM | POA: Diagnosis not present

## 2018-10-18 DIAGNOSIS — E1122 Type 2 diabetes mellitus with diabetic chronic kidney disease: Secondary | ICD-10-CM | POA: Diagnosis not present

## 2018-10-18 DIAGNOSIS — K219 Gastro-esophageal reflux disease without esophagitis: Secondary | ICD-10-CM | POA: Diagnosis not present

## 2018-10-18 DIAGNOSIS — Z6841 Body Mass Index (BMI) 40.0 and over, adult: Secondary | ICD-10-CM | POA: Diagnosis not present

## 2018-10-18 DIAGNOSIS — D631 Anemia in chronic kidney disease: Secondary | ICD-10-CM | POA: Diagnosis not present

## 2018-10-18 DIAGNOSIS — Z992 Dependence on renal dialysis: Secondary | ICD-10-CM | POA: Diagnosis not present

## 2018-10-18 DIAGNOSIS — I132 Hypertensive heart and chronic kidney disease with heart failure and with stage 5 chronic kidney disease, or end stage renal disease: Secondary | ICD-10-CM | POA: Diagnosis not present

## 2018-10-18 DIAGNOSIS — I428 Other cardiomyopathies: Secondary | ICD-10-CM | POA: Diagnosis not present

## 2018-10-18 DIAGNOSIS — E669 Obesity, unspecified: Secondary | ICD-10-CM | POA: Diagnosis not present

## 2018-10-18 DIAGNOSIS — N2581 Secondary hyperparathyroidism of renal origin: Secondary | ICD-10-CM | POA: Diagnosis not present

## 2018-10-18 DIAGNOSIS — Z79899 Other long term (current) drug therapy: Secondary | ICD-10-CM | POA: Diagnosis not present

## 2018-10-18 DIAGNOSIS — Z7982 Long term (current) use of aspirin: Secondary | ICD-10-CM | POA: Diagnosis not present

## 2018-10-18 DIAGNOSIS — I96 Gangrene, not elsewhere classified: Secondary | ICD-10-CM | POA: Diagnosis not present

## 2018-10-18 DIAGNOSIS — N186 End stage renal disease: Secondary | ICD-10-CM | POA: Diagnosis not present

## 2018-10-19 DIAGNOSIS — N186 End stage renal disease: Secondary | ICD-10-CM | POA: Diagnosis not present

## 2018-10-19 DIAGNOSIS — D631 Anemia in chronic kidney disease: Secondary | ICD-10-CM | POA: Diagnosis not present

## 2018-10-19 DIAGNOSIS — E785 Hyperlipidemia, unspecified: Secondary | ICD-10-CM | POA: Diagnosis not present

## 2018-10-19 DIAGNOSIS — N2581 Secondary hyperparathyroidism of renal origin: Secondary | ICD-10-CM | POA: Diagnosis not present

## 2018-10-20 DIAGNOSIS — N186 End stage renal disease: Secondary | ICD-10-CM | POA: Diagnosis not present

## 2018-10-20 DIAGNOSIS — Z992 Dependence on renal dialysis: Secondary | ICD-10-CM | POA: Diagnosis not present

## 2018-10-21 DIAGNOSIS — D509 Iron deficiency anemia, unspecified: Secondary | ICD-10-CM | POA: Diagnosis not present

## 2018-10-21 DIAGNOSIS — N186 End stage renal disease: Secondary | ICD-10-CM | POA: Diagnosis not present

## 2018-10-21 DIAGNOSIS — D631 Anemia in chronic kidney disease: Secondary | ICD-10-CM | POA: Diagnosis not present

## 2018-10-21 DIAGNOSIS — N2581 Secondary hyperparathyroidism of renal origin: Secondary | ICD-10-CM | POA: Diagnosis not present

## 2018-10-24 DIAGNOSIS — N186 End stage renal disease: Secondary | ICD-10-CM | POA: Diagnosis not present

## 2018-10-24 DIAGNOSIS — D509 Iron deficiency anemia, unspecified: Secondary | ICD-10-CM | POA: Diagnosis not present

## 2018-10-24 DIAGNOSIS — N2581 Secondary hyperparathyroidism of renal origin: Secondary | ICD-10-CM | POA: Diagnosis not present

## 2018-10-24 DIAGNOSIS — D631 Anemia in chronic kidney disease: Secondary | ICD-10-CM | POA: Diagnosis not present

## 2018-10-26 DIAGNOSIS — D509 Iron deficiency anemia, unspecified: Secondary | ICD-10-CM | POA: Diagnosis not present

## 2018-10-26 DIAGNOSIS — E119 Type 2 diabetes mellitus without complications: Secondary | ICD-10-CM | POA: Diagnosis not present

## 2018-10-26 DIAGNOSIS — N2581 Secondary hyperparathyroidism of renal origin: Secondary | ICD-10-CM | POA: Diagnosis not present

## 2018-10-26 DIAGNOSIS — N186 End stage renal disease: Secondary | ICD-10-CM | POA: Diagnosis not present

## 2018-10-26 DIAGNOSIS — D631 Anemia in chronic kidney disease: Secondary | ICD-10-CM | POA: Diagnosis not present

## 2018-10-28 DIAGNOSIS — N2581 Secondary hyperparathyroidism of renal origin: Secondary | ICD-10-CM | POA: Diagnosis not present

## 2018-10-28 DIAGNOSIS — D509 Iron deficiency anemia, unspecified: Secondary | ICD-10-CM | POA: Diagnosis not present

## 2018-10-28 DIAGNOSIS — N186 End stage renal disease: Secondary | ICD-10-CM | POA: Diagnosis not present

## 2018-10-28 DIAGNOSIS — D631 Anemia in chronic kidney disease: Secondary | ICD-10-CM | POA: Diagnosis not present

## 2018-10-31 DIAGNOSIS — N2581 Secondary hyperparathyroidism of renal origin: Secondary | ICD-10-CM | POA: Diagnosis not present

## 2018-10-31 DIAGNOSIS — D509 Iron deficiency anemia, unspecified: Secondary | ICD-10-CM | POA: Diagnosis not present

## 2018-10-31 DIAGNOSIS — N186 End stage renal disease: Secondary | ICD-10-CM | POA: Diagnosis not present

## 2018-10-31 DIAGNOSIS — D631 Anemia in chronic kidney disease: Secondary | ICD-10-CM | POA: Diagnosis not present

## 2018-11-02 DIAGNOSIS — N186 End stage renal disease: Secondary | ICD-10-CM | POA: Diagnosis not present

## 2018-11-02 DIAGNOSIS — D631 Anemia in chronic kidney disease: Secondary | ICD-10-CM | POA: Diagnosis not present

## 2018-11-02 DIAGNOSIS — D509 Iron deficiency anemia, unspecified: Secondary | ICD-10-CM | POA: Diagnosis not present

## 2018-11-02 DIAGNOSIS — N2581 Secondary hyperparathyroidism of renal origin: Secondary | ICD-10-CM | POA: Diagnosis not present

## 2018-11-04 DIAGNOSIS — D509 Iron deficiency anemia, unspecified: Secondary | ICD-10-CM | POA: Diagnosis not present

## 2018-11-04 DIAGNOSIS — N186 End stage renal disease: Secondary | ICD-10-CM | POA: Diagnosis not present

## 2018-11-04 DIAGNOSIS — N2581 Secondary hyperparathyroidism of renal origin: Secondary | ICD-10-CM | POA: Diagnosis not present

## 2018-11-04 DIAGNOSIS — D631 Anemia in chronic kidney disease: Secondary | ICD-10-CM | POA: Diagnosis not present

## 2018-11-06 DIAGNOSIS — I509 Heart failure, unspecified: Secondary | ICD-10-CM | POA: Diagnosis not present

## 2018-11-06 DIAGNOSIS — E1165 Type 2 diabetes mellitus with hyperglycemia: Secondary | ICD-10-CM | POA: Diagnosis not present

## 2018-11-06 DIAGNOSIS — N186 End stage renal disease: Secondary | ICD-10-CM | POA: Diagnosis not present

## 2018-11-06 DIAGNOSIS — G609 Hereditary and idiopathic neuropathy, unspecified: Secondary | ICD-10-CM | POA: Diagnosis not present

## 2018-11-06 DIAGNOSIS — E11319 Type 2 diabetes mellitus with unspecified diabetic retinopathy without macular edema: Secondary | ICD-10-CM | POA: Diagnosis not present

## 2018-11-06 DIAGNOSIS — I10 Essential (primary) hypertension: Secondary | ICD-10-CM | POA: Diagnosis not present

## 2018-11-07 DIAGNOSIS — N2581 Secondary hyperparathyroidism of renal origin: Secondary | ICD-10-CM | POA: Diagnosis not present

## 2018-11-07 DIAGNOSIS — D631 Anemia in chronic kidney disease: Secondary | ICD-10-CM | POA: Diagnosis not present

## 2018-11-07 DIAGNOSIS — N186 End stage renal disease: Secondary | ICD-10-CM | POA: Diagnosis not present

## 2018-11-07 DIAGNOSIS — D509 Iron deficiency anemia, unspecified: Secondary | ICD-10-CM | POA: Diagnosis not present

## 2018-11-09 DIAGNOSIS — N2581 Secondary hyperparathyroidism of renal origin: Secondary | ICD-10-CM | POA: Diagnosis not present

## 2018-11-09 DIAGNOSIS — N186 End stage renal disease: Secondary | ICD-10-CM | POA: Diagnosis not present

## 2018-11-09 DIAGNOSIS — D631 Anemia in chronic kidney disease: Secondary | ICD-10-CM | POA: Diagnosis not present

## 2018-11-09 DIAGNOSIS — D509 Iron deficiency anemia, unspecified: Secondary | ICD-10-CM | POA: Diagnosis not present

## 2018-11-11 DIAGNOSIS — N186 End stage renal disease: Secondary | ICD-10-CM | POA: Diagnosis not present

## 2018-11-11 DIAGNOSIS — N2581 Secondary hyperparathyroidism of renal origin: Secondary | ICD-10-CM | POA: Diagnosis not present

## 2018-11-11 DIAGNOSIS — D509 Iron deficiency anemia, unspecified: Secondary | ICD-10-CM | POA: Diagnosis not present

## 2018-11-11 DIAGNOSIS — D631 Anemia in chronic kidney disease: Secondary | ICD-10-CM | POA: Diagnosis not present

## 2018-11-14 DIAGNOSIS — N186 End stage renal disease: Secondary | ICD-10-CM | POA: Diagnosis not present

## 2018-11-14 DIAGNOSIS — N2581 Secondary hyperparathyroidism of renal origin: Secondary | ICD-10-CM | POA: Diagnosis not present

## 2018-11-14 DIAGNOSIS — D631 Anemia in chronic kidney disease: Secondary | ICD-10-CM | POA: Diagnosis not present

## 2018-11-14 DIAGNOSIS — D509 Iron deficiency anemia, unspecified: Secondary | ICD-10-CM | POA: Diagnosis not present

## 2018-11-16 DIAGNOSIS — N2581 Secondary hyperparathyroidism of renal origin: Secondary | ICD-10-CM | POA: Diagnosis not present

## 2018-11-16 DIAGNOSIS — D509 Iron deficiency anemia, unspecified: Secondary | ICD-10-CM | POA: Diagnosis not present

## 2018-11-16 DIAGNOSIS — D631 Anemia in chronic kidney disease: Secondary | ICD-10-CM | POA: Diagnosis not present

## 2018-11-16 DIAGNOSIS — N186 End stage renal disease: Secondary | ICD-10-CM | POA: Diagnosis not present

## 2018-11-18 DIAGNOSIS — D509 Iron deficiency anemia, unspecified: Secondary | ICD-10-CM | POA: Diagnosis not present

## 2018-11-18 DIAGNOSIS — N186 End stage renal disease: Secondary | ICD-10-CM | POA: Diagnosis not present

## 2018-11-18 DIAGNOSIS — N2581 Secondary hyperparathyroidism of renal origin: Secondary | ICD-10-CM | POA: Diagnosis not present

## 2018-11-18 DIAGNOSIS — D631 Anemia in chronic kidney disease: Secondary | ICD-10-CM | POA: Diagnosis not present

## 2018-11-20 DIAGNOSIS — N186 End stage renal disease: Secondary | ICD-10-CM | POA: Diagnosis not present

## 2018-11-20 DIAGNOSIS — Z992 Dependence on renal dialysis: Secondary | ICD-10-CM | POA: Diagnosis not present

## 2018-11-21 DIAGNOSIS — N2581 Secondary hyperparathyroidism of renal origin: Secondary | ICD-10-CM | POA: Diagnosis not present

## 2018-11-21 DIAGNOSIS — D509 Iron deficiency anemia, unspecified: Secondary | ICD-10-CM | POA: Diagnosis not present

## 2018-11-21 DIAGNOSIS — D631 Anemia in chronic kidney disease: Secondary | ICD-10-CM | POA: Diagnosis not present

## 2018-11-21 DIAGNOSIS — N186 End stage renal disease: Secondary | ICD-10-CM | POA: Diagnosis not present

## 2018-11-23 DIAGNOSIS — N2581 Secondary hyperparathyroidism of renal origin: Secondary | ICD-10-CM | POA: Diagnosis not present

## 2018-11-23 DIAGNOSIS — N186 End stage renal disease: Secondary | ICD-10-CM | POA: Diagnosis not present

## 2018-11-23 DIAGNOSIS — D509 Iron deficiency anemia, unspecified: Secondary | ICD-10-CM | POA: Diagnosis not present

## 2018-11-23 DIAGNOSIS — D631 Anemia in chronic kidney disease: Secondary | ICD-10-CM | POA: Diagnosis not present

## 2018-11-23 DIAGNOSIS — E119 Type 2 diabetes mellitus without complications: Secondary | ICD-10-CM | POA: Diagnosis not present

## 2018-11-25 DIAGNOSIS — N186 End stage renal disease: Secondary | ICD-10-CM | POA: Diagnosis not present

## 2018-11-25 DIAGNOSIS — D509 Iron deficiency anemia, unspecified: Secondary | ICD-10-CM | POA: Diagnosis not present

## 2018-11-25 DIAGNOSIS — N2581 Secondary hyperparathyroidism of renal origin: Secondary | ICD-10-CM | POA: Diagnosis not present

## 2018-11-25 DIAGNOSIS — D631 Anemia in chronic kidney disease: Secondary | ICD-10-CM | POA: Diagnosis not present

## 2018-11-26 ENCOUNTER — Emergency Department (HOSPITAL_COMMUNITY): Admit: 2018-11-26 | Payer: Medicare Other

## 2018-11-26 ENCOUNTER — Other Ambulatory Visit: Payer: Self-pay

## 2018-11-26 ENCOUNTER — Encounter (HOSPITAL_COMMUNITY): Payer: Self-pay

## 2018-11-26 ENCOUNTER — Emergency Department (HOSPITAL_COMMUNITY)
Admission: EM | Admit: 2018-11-26 | Discharge: 2018-11-26 | Disposition: A | Discharge status: Home / Self Care | Payer: Medicare Other | Attending: Emergency Medicine | Admitting: Emergency Medicine

## 2018-11-26 ENCOUNTER — Emergency Department (HOSPITAL_COMMUNITY): Payer: Medicare Other

## 2018-11-26 ENCOUNTER — Emergency Department (HOSPITAL_BASED_OUTPATIENT_CLINIC_OR_DEPARTMENT_OTHER)
Admit: 2018-11-26 | Discharge: 2018-11-26 | Disposition: A | Discharge status: Home / Self Care | Payer: Medicare Other | Attending: Emergency Medicine | Admitting: Emergency Medicine

## 2018-11-26 DIAGNOSIS — M79605 Pain in left leg: Secondary | ICD-10-CM | POA: Insufficient documentation

## 2018-11-26 DIAGNOSIS — Z79899 Other long term (current) drug therapy: Secondary | ICD-10-CM | POA: Diagnosis not present

## 2018-11-26 DIAGNOSIS — R791 Abnormal coagulation profile: Secondary | ICD-10-CM | POA: Diagnosis not present

## 2018-11-26 DIAGNOSIS — Z992 Dependence on renal dialysis: Secondary | ICD-10-CM | POA: Insufficient documentation

## 2018-11-26 DIAGNOSIS — N186 End stage renal disease: Secondary | ICD-10-CM | POA: Diagnosis not present

## 2018-11-26 DIAGNOSIS — E1122 Type 2 diabetes mellitus with diabetic chronic kidney disease: Secondary | ICD-10-CM | POA: Insufficient documentation

## 2018-11-26 DIAGNOSIS — I132 Hypertensive heart and chronic kidney disease with heart failure and with stage 5 chronic kidney disease, or end stage renal disease: Secondary | ICD-10-CM | POA: Insufficient documentation

## 2018-11-26 DIAGNOSIS — I5022 Chronic systolic (congestive) heart failure: Secondary | ICD-10-CM | POA: Insufficient documentation

## 2018-11-26 DIAGNOSIS — Z7984 Long term (current) use of oral hypoglycemic drugs: Secondary | ICD-10-CM | POA: Diagnosis not present

## 2018-11-26 DIAGNOSIS — R2243 Localized swelling, mass and lump, lower limb, bilateral: Secondary | ICD-10-CM | POA: Insufficient documentation

## 2018-11-26 DIAGNOSIS — M7989 Other specified soft tissue disorders: Secondary | ICD-10-CM

## 2018-11-26 DIAGNOSIS — R7989 Other specified abnormal findings of blood chemistry: Secondary | ICD-10-CM | POA: Diagnosis not present

## 2018-11-26 DIAGNOSIS — R000 Tachycardia, unspecified: Secondary | ICD-10-CM | POA: Diagnosis not present

## 2018-11-26 DIAGNOSIS — R52 Pain, unspecified: Secondary | ICD-10-CM | POA: Diagnosis not present

## 2018-11-26 DIAGNOSIS — M79604 Pain in right leg: Secondary | ICD-10-CM | POA: Insufficient documentation

## 2018-11-26 LAB — CBC WITH DIFFERENTIAL/PLATELET
Abs Immature Granulocytes: 0.01 10*3/uL (ref 0.00–0.07)
Basophils Absolute: 0.1 10*3/uL (ref 0.0–0.1)
Basophils Relative: 1 %
Eosinophils Absolute: 0.2 10*3/uL (ref 0.0–0.5)
Eosinophils Relative: 3 %
HCT: 36.6 % — ABNORMAL LOW (ref 39.0–52.0)
Hemoglobin: 10.7 g/dL — ABNORMAL LOW (ref 13.0–17.0)
Immature Granulocytes: 0 %
Lymphocytes Relative: 18 %
Lymphs Abs: 1.3 10*3/uL (ref 0.7–4.0)
MCH: 28.6 pg (ref 26.0–34.0)
MCHC: 29.2 g/dL — ABNORMAL LOW (ref 30.0–36.0)
MCV: 97.9 fL (ref 80.0–100.0)
Monocytes Absolute: 0.8 10*3/uL (ref 0.1–1.0)
Monocytes Relative: 11 %
Neutro Abs: 4.8 10*3/uL (ref 1.7–7.7)
Neutrophils Relative %: 67 %
Platelets: 360 10*3/uL (ref 150–400)
RBC: 3.74 MIL/uL — ABNORMAL LOW (ref 4.22–5.81)
RDW: 15.4 % (ref 11.5–15.5)
WBC: 7.1 10*3/uL (ref 4.0–10.5)
nRBC: 0 % (ref 0.0–0.2)

## 2018-11-26 LAB — BASIC METABOLIC PANEL
Anion gap: 16 — ABNORMAL HIGH (ref 5–15)
BUN: 23 mg/dL — ABNORMAL HIGH (ref 6–20)
CO2: 26 mmol/L (ref 22–32)
Calcium: 7.8 mg/dL — ABNORMAL LOW (ref 8.9–10.3)
Chloride: 93 mmol/L — ABNORMAL LOW (ref 98–111)
Creatinine, Ser: 9.18 mg/dL — ABNORMAL HIGH (ref 0.61–1.24)
GFR calc Af Amer: 7 mL/min — ABNORMAL LOW (ref >60)
GFR calc non Af Amer: 6 mL/min — ABNORMAL LOW (ref >60)
Glucose, Bld: 263 mg/dL — ABNORMAL HIGH (ref 70–99)
Potassium: 4.4 mmol/L (ref 3.5–5.1)
Sodium: 135 mmol/L (ref 135–145)

## 2018-11-26 LAB — LACTIC ACID, PLASMA: Lactic Acid, Venous: 1.2 mmol/L (ref 0.5–1.9)

## 2018-11-26 LAB — D-DIMER, QUANTITATIVE: D-Dimer, Quant: 2.51 ug/mL-FEU — ABNORMAL HIGH (ref 0.00–0.50)

## 2018-11-26 MED ORDER — IOHEXOL 350 MG/ML SOLN
100.0000 mL | Freq: Once | INTRAVENOUS | Status: AC | PRN
  Administered 2018-11-26: 100 mL via INTRAVENOUS

## 2018-11-26 MED ORDER — OXYCODONE-ACETAMINOPHEN 5-325 MG PO TABS: 1.0000 | ORAL_TABLET | ORAL | 0 refills | Status: DC | PRN

## 2018-11-26 MED ORDER — SODIUM CHLORIDE (PF) 0.9 % IJ SOLN
INTRAMUSCULAR | Status: AC
  Administered 2018-11-26: 15:00:00
  Filled 2018-11-26: qty 50

## 2018-11-26 NOTE — ED Provider Notes (Signed)
  Face-to-face evaluation   History: He presents for evaluation of bilateral leg pain with nodules, present for 2 weeks and uncomfortable with walking.  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  He denies recent trauma.  He dialyzed yesterday.  Physical exam: Overweight, alert cooperative.  Bilateral nodularity of the thighs and calfs.  Nonspecific appearance, with mild swelling of right leg as compared to left.  Medical screening examination/treatment/procedure(s) were conducted as a shared visit with non-physician practitioner(s) and myself.  I personally evaluated the patient during the encounter    Daleen Bo, MD 11/28/18 [phone]

## 2018-11-26 NOTE — Progress Notes (Signed)
Bilateral lower extremity venous duplex has been completed. Preliminary results can be found in CV Proc through chart review.  Results were given to Eustaquio Maize PA.  11/26/18 3:37 PM Carlos Levering RVT

## 2018-11-26 NOTE — ED Provider Notes (Signed)
[facility] DEPT Provider Note   CSN: [id number] Arrival date & time: 11/26/18  1126     History   Chief Complaint Chief Complaint  Patient presents with   Leg Pain    HPI [name] [name] is a 53 y.o. male with PMHx diabetes, ESRD on dialysis T Th S, HTN, neuropathy, who presents to the ED waning of gradual onset, constant, achy, bilateral lower extremity pain x2 weeks.  Recent injury.  Patient does endorse he has had multiple surgeries including amputations of his fingers on the left and right hand.  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  States he has been taking Tylenol for pain with mild relief.  Denies history of DVT/PE.  The pain is worse when patient is trying to walk.  Denies fever, chills, redness, drainage, chest pain, shortness of breath any other associated symptoms.       Past Medical History:  Diagnosis Date   Anal infection    posterior anal canal   Anemia, chronic renal failure    Atrophic kidney    BILATERAL   DM type 2 causing ESRD ([place])    Nephrologist-- dr [name] [facility])--  on hemodialysis since June 2012 at  [name]  TTS   Hemodialysis patient [facility])    at [place] on Tues/ Thur/Sat/schedule   Hemorrhoids    Hepatitis B antibody positive    History of pleural effusion    bilateral   Hyperparathyroidism, secondary renal ([place])    Hypertension    Ischemic cardiomyopathy    per echo 07-01-2014  ef 45%   LAFB (left anterior fascicular block)    Peripheral neuropathy    Systolic and diastolic CHF, chronic ([place])    CARDIOLOGIST-  DR Daneen Schick ([place])  AND DR [name] ([name])    Patient Active Problem List   Diagnosis Date Noted   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) 08/19/2009   CKD (chronic kidney disease) stage V requiring chronic dialysis ([place]) 08/19/2009   Depression 08/19/2009    Past Surgical History:  Procedure Laterality Date   APPENDECTOMY  09-12-2004   laparotomy w/ drainage peritinitis   AV FISTULA PLACEMENT  02-27-2010   right forearm (RADIOCEPHALIC)   AV FISTULA REPAIR  10-30-2010   CARDIOVASCULAR STRESS TEST  10-29-2011   dr Daneen Schick   Low risk scan;  mild perfusion defect seen in the basal inferoseptal, basal inferior and mid inferior regions consistent with an infarct/scar and/or overlying attenuation/  mild to moderate global LVSF,  ef 40-45%   DOBUTAMINE STRESS ECHO  07-23-2012   [name]   abnormal ;  at rest estimated lvef 25-30% and global severe LV hypokinesis ;  no cp during stress and achieved 85% maxium predicted heart rate;  negative stress ECG for inducible ischemia;  estimated lvef with stress 35-40%;  augmentation of wall segments consistant with cardiomyopathy and differential fibrosis   FISTULOTOMY N/A 03/26/2015   Procedure: FISTULOTOMY;  Surgeon: [name], MD;  Location: [place];  Service: General;  Laterality: N/A;   INCISION  AND DRAINAGE ABSCESS N/A 03/26/2015   Procedure: ANAL INCISION AND DRAINAGE;  Surgeon: [name], MD;  Location: [facility];  Service: General;  Laterality: N/A;   RETINAL DETACHMENT SURGERY Left 2011   incomplete repair/ needs eye drops to keep pressure down   TEE WITHOUT CARDIOVERSION N/A 10/17/2017   Procedure: TRANSESOPHAGEAL ECHOCARDIOGRAM (TEE);  Surgeon: Josue Hector, MD;  Location: [facility] ENDOSCOPY;  Service: Cardiovascular;  Laterality: N/A;   TRANSTHORACIC  ECHOCARDIOGRAM  07-01-2014    done at [facility]   grade 1 diastolic dysfunction,  ef 45%/  trace TR and PR        Home Medications    Prior to Admission medications   Medication Sig Start Date End Date Taking? Authorizing Provider  acetaminophen (TYLENOL) 500 MG tablet Take 1,000 mg by mouth as needed.   Yes [provider]  aspirin 325 MG tablet Take 325 mg by mouth daily.   Yes [provider]  calcium acetate (PHOSLO) 667 MG capsule Take 2,001 mg by mouth 3 (three) times daily with meals.  06/02/10  Yes [provider]  cinacalcet (SENSIPAR) 60 MG tablet Take 60 mg by mouth daily.   Yes [provider]  cycloSPORINE (RESTASIS) 0.05 % ophthalmic emulsion Place 1 drop into both eyes 2 (two) times daily. 04/22/14  Yes [provider]  febuxostat (ULORIC) 40 MG tablet Take 40 mg by mouth daily.    Yes [provider]  ferric citrate (AURYXIA) 1 GM 210 MG(Fe) tablet Take 420 mg by mouth 3 (three) times daily.   Yes [provider]  gabapentin (NEURONTIN) 300 MG capsule Take 300 mg by mouth 3 (three) times daily as needed. For pain 05/09/15  Yes [provider]  Insulin Aspart Prot & Aspart (NOVOLOG MIX 70/30 PENFILL [place]) Inject 14 Units into the skin 3 (three) times daily. Adjusts dose if CBG greater than 150   Yes [provider]  LIPITOR 10 MG tablet Take 10 mg by mouth daily. 01/08/16  Yes [provider]  midodrine (PROAMATINE) 5 MG tablet Take 5 mg by mouth Every Tuesday,Thursday,and Saturday with dialysis.  10/06/17  Yes [provider]  Multiple Vitamins-Minerals (MEGA MULTIVITAMIN FOR MEN PO) Take 1 tablet by mouth daily.    Yes [provider]  oxyCODONE-acetaminophen (PERCOCET) 10-325 MG tablet Take 1 tablet by mouth every 4 (four) hours as needed for pain.  07/04/18  Yes [provider]  sevelamer carbonate (RENVELA) 800 MG tablet Take 2,400 mg by mouth 3  (three) times daily with meals.    Yes [provider]  oxyCODONE-acetaminophen (PERCOCET/ROXICET) 5-325 MG tablet Take 1 tablet by mouth every 4 (four) hours as needed for severe pain. 11/26/18   Eustaquio Maize, PA-C    Family History Family History  Family history unknown: Yes    Social History Social History   Tobacco Use   Smoking status: Never Smoker   Smokeless tobacco: Never Used  Substance Use Topics   Alcohol use: No   Drug use: No     Allergies   Patient has no known allergies.   Review of Systems Review of Systems  Constitutional: Negative for chills and fever.  HENT: Negative for congestion.   Eyes: Negative for visual disturbance.  Respiratory: Negative for shortness of breath.   Cardiovascular: Negative for chest pain.  Gastrointestinal: Negative for abdominal pain.  Genitourinary: Negative for frequency.  Musculoskeletal: Positive for arthralgias.  Skin: Negative for color change.  Neurological: Negative for headaches.     Physical Exam Updated Vital Signs BP (!) 93/43    Pulse (!) 118    Temp 99.1 F (37.3 C) (Oral)    Resp 16    Wt 105.7 kg    SpO2 96%    BMI 38.77 kg/m   Physical Exam Vitals signs and nursing note reviewed.  Constitutional:      Appearance: He is not ill-appearing.  HENT:     Head: Normocephalic and atraumatic.  Eyes:     Conjunctiva/sclera: Conjunctivae normal.  Neck:     Musculoskeletal: Neck supple.  Cardiovascular:     Rate and Rhythm: Normal rate and regular rhythm.     Pulses: Normal pulses.          Dorsalis pedis pulses are 2+ on the right side and 2+ on the left side.  Pulmonary:     Effort: Pulmonary effort is normal.     Breath sounds: Normal breath sounds. No wheezing, rhonchi or rales.  Abdominal:     Palpations: Abdomen is soft.     Tenderness: There is no abdominal tenderness. There is no guarding or rebound.  Musculoskeletal:     Comments: Mild swelling noted to BLEs, worse on right side.  Pt has palpable nodules in his BLEs that are TTP. ROM intact throughout BLEs. Sensation and strength intact. Good distal pulses.   Skin:    General: Skin is warm and dry.  Neurological:     Mental Status: He is alert.      ED Treatments / Results  Labs (all labs ordered are listed, but only abnormal results are displayed) Labs Reviewed  CBC WITH DIFFERENTIAL/PLATELET - Abnormal; Notable for the following components:      Result Value   RBC 3.74 (*)    Hemoglobin 10.7 (*)    HCT 36.6 (*)    MCHC 29.2 (*)    All other components within normal limits  BASIC METABOLIC PANEL - Abnormal; Notable for the following components:   Chloride 93 (*)    Glucose, Bld 263 (*)    BUN 23 (*)    Creatinine, Ser 9.18 (*)    Calcium 7.8 (*)    GFR calc non Af Amer 6 (*)    GFR calc Af Amer 7 (*)    Anion gap 16 (*)    All other components within normal limits  D-DIMER, QUANTITATIVE (NOT AT [facility]) - Abnormal; Notable for the following components:   D-Dimer, Quant 2.51 (*)    All other components within normal limits  LACTIC ACID, PLASMA  LACTIC ACID, PLASMA    EKG EKG Interpretation  Date/Time:  Sunday November 26 2018 12:56:18 EDT Ventricular Rate:  104 PR Interval:    QRS Duration: 85 QT Interval:  350 QTC Calculation: 461 R Axis:   95 Text Interpretation:  Sinus tachycardia Borderline right axis deviation Baseline wander in lead(s) V6 since last tracing no significant change Confirmed by Daleen Bo [phone]) on 11/26/2018 1:06:56 PM   Radiology Ct Angio Chest Pe W/cm &/or Wo Cm  Result Date: 11/26/2018 CLINICAL DATA:  Suspect pulmonary embolus, intermediate probability. Positive D-dimer. Pain in LEFT leg and thigh as well the RIGHT leg. Pain for 2 weeks. Swelling and tenderness in the legs. EXAM: CT ANGIOGRAPHY CHEST WITH CONTRAST TECHNIQUE: Multidetector CT imaging of the chest was performed using the standard protocol during bolus administration of intravenous contrast. Multiplanar  CT image reconstructions and MIPs were obtained to evaluate the vascular anatomy.  CONTRAST:  199mL OMNIPAQUE IOHEXOL 350 MG/ML SOLN COMPARISON:  Chest x-ray on 02/27/2010 FINDINGS: Cardiovascular: There is dense atherosclerotic calcification of the coronary arteries. Heart size is normal. No pericardial effusion. The thoracic aorta is partially calcified but not aneurysmal. Pulmonary arteries are moderately well opacified. No central, lobar, or segmental emboli are identified. Smaller peripheral emboli might be difficult to exclude given the technique. Mediastinum/Nodes: Benign-appearing circumscribed 8 millimeter low-attenuation lesion within the LEFT thyroid lobe. Esophageal wall is diffusely thickened, without evidence for focal lesion. No significant mediastinal, hilar, or axillary adenopathy. Lungs/Pleura: Lungs are clear. No pleural effusion or pneumothorax. Upper Abdomen: No acute abnormality. Musculoskeletal: No chest wall abnormality. No acute or significant osseous findings. Review of the MIP images confirms the above findings. IMPRESSION: 1. Technically adequate exam showing no central, lobar, or segmental pulmonary emboli. Smaller peripheral emboli might be difficult to exclude given the technique. 2. Coronary artery disease. 3. Diffuse esophageal wall thickening, raising the question of esophagitis. 4. Aortic Atherosclerosis (ICD10-I70.0). 5. Benign-appearing LEFT thyroid nodule. No further evaluation is necessary based on consensus criteria. Electronically Signed   By: Nolon Nations M.D.   On: 11/26/2018 14:33   Vas [place] Lower Extremity Venous (dvt) (mc And Wl 7a-7p)  Result Date: 11/26/2018  Lower Venous Study Indications: Pain, and Swelling.  Risk Factors: None identified. Limitations: Body habitus, poor ultrasound/tissue interface and patient pain tolerance. Comparison Study: No prior studies. Performing Technologist: Oliver Hum RVT  Examination Guidelines: A complete evaluation includes  B-mode imaging, spectral Doppler, color Doppler, and power Doppler as needed of all accessible portions of each vessel. Bilateral testing is considered an integral part of a complete examination. Limited examinations for reoccurring indications may be performed as noted.  +---------+---------------+---------+-----------+----------+--------------+  RIGHT     Compressibility Phasicity Spontaneity Properties Thrombus Aging  +---------+---------------+---------+-----------+----------+--------------+  CFV       Full            Yes       Yes                                    +---------+---------------+---------+-----------+----------+--------------+  SFJ       Full                                                             +---------+---------------+---------+-----------+----------+--------------+  FV Prox   Full                                                             +---------+---------------+---------+-----------+----------+--------------+  FV Mid    Full                                                             +---------+---------------+---------+-----------+----------+--------------+  FV Distal Full                                                             +---------+---------------+---------+-----------+----------+--------------+  PFV       Full                                                             +---------+---------------+---------+-----------+----------+--------------+  POP       Full            Yes       Yes                                    +---------+---------------+---------+-----------+----------+--------------+  PTV       Full                                                             +---------+---------------+---------+-----------+----------+--------------+  PERO      Full                                                             +---------+---------------+---------+-----------+----------+--------------+   +---------+---------------+---------+-----------+----------+--------------+   LEFT      Compressibility Phasicity Spontaneity Properties Thrombus Aging  +---------+---------------+---------+-----------+----------+--------------+  CFV       Full            Yes       Yes                                    +---------+---------------+---------+-----------+----------+--------------+  SFJ       Full                                                             +---------+---------------+---------+-----------+----------+--------------+  FV Prox   Full                                                             +---------+---------------+---------+-----------+----------+--------------+  FV Mid    Full                                                             +---------+---------------+---------+-----------+----------+--------------+  FV Distal Full                                                             +---------+---------------+---------+-----------+----------+--------------+  PFV       Full                                                             +---------+---------------+---------+-----------+----------+--------------+  POP       Full            Yes       Yes                                    +---------+---------------+---------+-----------+----------+--------------+  PTV       Full                                                             +---------+---------------+---------+-----------+----------+--------------+  PERO      Full                                                             +---------+---------------+---------+-----------+----------+--------------+     Summary: Right: There is no evidence of deep vein thrombosis in the lower extremity. However, portions of this examination were limited- see technologist comments above. No cystic structure found in the popliteal fossa. Left: There is no evidence of deep vein thrombosis in the lower extremity. However, portions of this examination were limited- see technologist comments above. No cystic structure found in the popliteal  fossa.  *See table(s) above for measurements and observations.    Preliminary     Procedures Procedures (including critical care time)  Medications Ordered in ED Medications  iohexol (OMNIPAQUE) 350 MG/ML injection 100 mL (100 mLs Intravenous Contrast Given 11/26/18 1413)  sodium chloride (PF) 0.9 % injection (  Given by Other 11/26/18 1510)     Initial Impression / Assessment and Plan / ED Course  I have reviewed the triage vital signs and the nursing notes.  Pertinent labs & imaging results that were available during my care of the patient were reviewed by me and considered in my medical decision making (see chart for details).    53 year old male who came into the ED for BLE pain x 2 weeks.  Obvious injury to the legs.  Patient does have a palpable nodules in his legs that are tender to palpation.  His right leg is slightly lower swollen compared to the left.  No overlying skin changes including redness.  Patient is tachycardic on exam but reports that this is his baseline.  Concern for DVT today given patient has had multiple surgeries for his hands recently.  Most recent 7/29.  History of DVT/PE in the past.  Will obtain ultrasound study of his legs at this time and obtain blood work including CBC, BMP, lactic acid, d-dimer given tachycardia.  Patient denies shortness of breath or chest pain at this time.   CBC without leukocytosis.  Globin stable.  BMP with active findings given  patient's end-stage renal disease.  His potassium is within normal limits.  Scheduled to have dialysis again on Tuesday.  He has not missed any appointments recently.  Dimer elevated at 2.51.  Will obtain CTA of his chest at this time.   Ultrasound of his legs are negative for DVT.  Gust case with ultrasound tech who states that patient does have some varicose veins in his left lower extremity but states that they are patent.  CTA negative for PE.   Gust findings with patient.  Advised that he follow-up with  orthopedics for bilateral lower extremity pain.  Given a short course of pain medication at this time.  Ice to take only as needed.  Patient is in agreement with plan at this time is stable for discharge home.  This note was prepared using Dragon voice recognition software and may include unintentional dictation errors due to the inherent limitations of voice recognition software.       Final Clinical Impressions(s) / ED Diagnoses   Final diagnoses:  Bilateral leg pain    ED Discharge Orders         Ordered    oxyCODONE-acetaminophen (PERCOCET/ROXICET) 5-325 MG tablet  Every 4 hours PRN     11/26/18 1613           Eustaquio Maize, PA-C 11/26/18 1626    Daleen Bo, MD 11/28/18 [phone]

## 2018-11-26 NOTE — ED Triage Notes (Signed)
Pt states has pain in left leg and thigh, as well as right leg. Pt states pain x 2 weeks.  Pt endorses swelling and tenderness in legs.

## 2018-11-26 NOTE — Discharge Instructions (Signed)
You were seen in the ED today for pain to both of your legs We obtained bloodwork, an ultrasound of your legs, and a CT scan of your chest Labwork was reassuring besides your kidney function which is expected with your kidney disease. Your ultrasound was negative for any bloodclots in your legs and your CT scan was negative for blood clots in your lungs.  Please follow up with your orthopedist regarding your leg pain.  I have prescribed a very short course of pain medication to take as needed.

## 2018-11-28 DIAGNOSIS — Z79899 Other long term (current) drug therapy: Secondary | ICD-10-CM | POA: Diagnosis not present

## 2018-11-28 DIAGNOSIS — Z79891 Long term (current) use of opiate analgesic: Secondary | ICD-10-CM | POA: Diagnosis not present

## 2018-11-28 DIAGNOSIS — E875 Hyperkalemia: Secondary | ICD-10-CM | POA: Diagnosis not present

## 2018-11-28 DIAGNOSIS — N2581 Secondary hyperparathyroidism of renal origin: Secondary | ICD-10-CM | POA: Diagnosis not present

## 2018-11-28 DIAGNOSIS — M159 Polyosteoarthritis, unspecified: Secondary | ICD-10-CM | POA: Diagnosis not present

## 2018-11-28 DIAGNOSIS — I8393 Asymptomatic varicose veins of bilateral lower extremities: Secondary | ICD-10-CM | POA: Diagnosis not present

## 2018-11-28 DIAGNOSIS — M79669 Pain in unspecified lower leg: Secondary | ICD-10-CM | POA: Diagnosis not present

## 2018-11-28 DIAGNOSIS — E7800 Pure hypercholesterolemia, unspecified: Secondary | ICD-10-CM | POA: Diagnosis not present

## 2018-11-28 DIAGNOSIS — M79643 Pain in unspecified hand: Secondary | ICD-10-CM | POA: Diagnosis not present

## 2018-11-28 DIAGNOSIS — I739 Peripheral vascular disease, unspecified: Secondary | ICD-10-CM | POA: Diagnosis not present

## 2018-11-28 DIAGNOSIS — D509 Iron deficiency anemia, unspecified: Secondary | ICD-10-CM | POA: Diagnosis not present

## 2018-11-28 DIAGNOSIS — N186 End stage renal disease: Secondary | ICD-10-CM | POA: Diagnosis not present

## 2018-11-28 DIAGNOSIS — I5032 Chronic diastolic (congestive) heart failure: Secondary | ICD-10-CM | POA: Diagnosis not present

## 2018-11-28 DIAGNOSIS — D631 Anemia in chronic kidney disease: Secondary | ICD-10-CM | POA: Diagnosis not present

## 2018-11-28 DIAGNOSIS — E1140 Type 2 diabetes mellitus with diabetic neuropathy, unspecified: Secondary | ICD-10-CM | POA: Diagnosis not present

## 2018-11-30 DIAGNOSIS — N186 End stage renal disease: Secondary | ICD-10-CM | POA: Diagnosis not present

## 2018-11-30 DIAGNOSIS — N2581 Secondary hyperparathyroidism of renal origin: Secondary | ICD-10-CM | POA: Diagnosis not present

## 2018-11-30 DIAGNOSIS — D631 Anemia in chronic kidney disease: Secondary | ICD-10-CM | POA: Diagnosis not present

## 2018-11-30 DIAGNOSIS — D509 Iron deficiency anemia, unspecified: Secondary | ICD-10-CM | POA: Diagnosis not present

## 2018-12-01 DIAGNOSIS — M79605 Pain in left leg: Secondary | ICD-10-CM | POA: Diagnosis not present

## 2018-12-01 DIAGNOSIS — R110 Nausea: Secondary | ICD-10-CM | POA: Diagnosis not present

## 2018-12-02 DIAGNOSIS — D509 Iron deficiency anemia, unspecified: Secondary | ICD-10-CM | POA: Diagnosis not present

## 2018-12-02 DIAGNOSIS — N2581 Secondary hyperparathyroidism of renal origin: Secondary | ICD-10-CM | POA: Diagnosis not present

## 2018-12-02 DIAGNOSIS — D631 Anemia in chronic kidney disease: Secondary | ICD-10-CM | POA: Diagnosis not present

## 2018-12-02 DIAGNOSIS — N186 End stage renal disease: Secondary | ICD-10-CM | POA: Diagnosis not present

## 2018-12-05 DIAGNOSIS — D509 Iron deficiency anemia, unspecified: Secondary | ICD-10-CM | POA: Diagnosis not present

## 2018-12-05 DIAGNOSIS — N186 End stage renal disease: Secondary | ICD-10-CM | POA: Diagnosis not present

## 2018-12-05 DIAGNOSIS — D631 Anemia in chronic kidney disease: Secondary | ICD-10-CM | POA: Diagnosis not present

## 2018-12-05 DIAGNOSIS — N2581 Secondary hyperparathyroidism of renal origin: Secondary | ICD-10-CM | POA: Diagnosis not present

## 2018-12-07 DIAGNOSIS — D509 Iron deficiency anemia, unspecified: Secondary | ICD-10-CM | POA: Diagnosis not present

## 2018-12-07 DIAGNOSIS — N2581 Secondary hyperparathyroidism of renal origin: Secondary | ICD-10-CM | POA: Diagnosis not present

## 2018-12-07 DIAGNOSIS — D631 Anemia in chronic kidney disease: Secondary | ICD-10-CM | POA: Diagnosis not present

## 2018-12-07 DIAGNOSIS — N186 End stage renal disease: Secondary | ICD-10-CM | POA: Diagnosis not present

## 2018-12-09 DIAGNOSIS — D631 Anemia in chronic kidney disease: Secondary | ICD-10-CM | POA: Diagnosis not present

## 2018-12-09 DIAGNOSIS — N186 End stage renal disease: Secondary | ICD-10-CM | POA: Diagnosis not present

## 2018-12-09 DIAGNOSIS — N2581 Secondary hyperparathyroidism of renal origin: Secondary | ICD-10-CM | POA: Diagnosis not present

## 2018-12-09 DIAGNOSIS — D509 Iron deficiency anemia, unspecified: Secondary | ICD-10-CM | POA: Diagnosis not present

## 2018-12-12 DIAGNOSIS — D631 Anemia in chronic kidney disease: Secondary | ICD-10-CM | POA: Diagnosis not present

## 2018-12-12 DIAGNOSIS — N2581 Secondary hyperparathyroidism of renal origin: Secondary | ICD-10-CM | POA: Diagnosis not present

## 2018-12-12 DIAGNOSIS — D509 Iron deficiency anemia, unspecified: Secondary | ICD-10-CM | POA: Diagnosis not present

## 2018-12-12 DIAGNOSIS — N186 End stage renal disease: Secondary | ICD-10-CM | POA: Diagnosis not present

## 2018-12-14 DIAGNOSIS — D509 Iron deficiency anemia, unspecified: Secondary | ICD-10-CM | POA: Diagnosis not present

## 2018-12-14 DIAGNOSIS — N2581 Secondary hyperparathyroidism of renal origin: Secondary | ICD-10-CM | POA: Diagnosis not present

## 2018-12-14 DIAGNOSIS — N186 End stage renal disease: Secondary | ICD-10-CM | POA: Diagnosis not present

## 2018-12-14 DIAGNOSIS — D631 Anemia in chronic kidney disease: Secondary | ICD-10-CM | POA: Diagnosis not present

## 2018-12-15 ENCOUNTER — Ambulatory Visit
Admission: EM | Admit: 2018-12-15 | Discharge: 2018-12-15 | Disposition: A | Discharge status: Home / Self Care | Payer: Medicare Other | Attending: Emergency Medicine | Admitting: Emergency Medicine

## 2018-12-15 ENCOUNTER — Other Ambulatory Visit: Payer: Self-pay

## 2018-12-15 DIAGNOSIS — A57 Chancroid: Secondary | ICD-10-CM | POA: Diagnosis not present

## 2018-12-15 MED ORDER — AZITHROMYCIN 500 MG PO TABS
1000.0000 mg | ORAL_TABLET | Freq: Every day | ORAL | Status: DC
  Administered 2018-12-15: 1000 mg via ORAL

## 2018-12-15 NOTE — ED Triage Notes (Signed)
Pt presents to UC w/ c/o redness and soreness to tip of penis since 1.5 weeks ago. Pt reports using vaseline to try to help without relief. Pt is on dialysis and has been anuric since 2014. Penis is painful to touch.

## 2018-12-15 NOTE — Discharge Instructions (Signed)
Keep ear clean and dry. Important to pat the area dry after bathing. May use hot water soaks for pain alleviation. You were treated today for your chancroid with an antibiotic: Important to follow-up with your primary care doctor. Return for worsening pain, bleeding, saccular pain/swelling, abdominal pain, fever.

## 2018-12-15 NOTE — ED Provider Notes (Addendum)
[facility]    CSN: [id number] Arrival date & time: 12/15/18  [id number]      History   Chief Complaint Chief Complaint  Patient presents with  . penile pain    HPI KOBEE NONG is a 53 y.o. male with history of ER SD on HD, type 2 diabetes, CHF presenting for 1-1/2-week course of painful lesion on the head of his penis.  States it started out red, tender, so I started using Vaseline without improvement.  Patient has maintained celibacy since 2014: Denies history of STD, "I was checked regularly ".  Patient is compliant with his medications, attends HD Tuesday, Thursday, Saturday.  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  Denies history of calcium nodules.   Past Medical History:  Diagnosis Date  . Anal infection    posterior anal canal  . Anemia, chronic renal failure   . Atrophic kidney    BILATERAL  . DM type 2 causing ESRD [facility])    Nephrologist-- dr [name] [facility])--  on hemodialysis since June 2012 at  [name]  TTS  . Hemodialysis patient [facility])    at [place] on Tues/ Thur/Sat/schedule  . Hemorrhoids   . Hepatitis B antibody positive   . History of pleural effusion    bilateral  . Hyperparathyroidism, secondary renal ([place])   . Hypertension   . Ischemic cardiomyopathy    per echo 07-01-2014  ef 45%  . LAFB (left anterior fascicular block)   . Peripheral neuropathy   . Systolic and diastolic CHF, chronic ([place])    CARDIOLOGIST-  DR Daneen Schick ([place])  AND DR [name] ([name])    Patient Active Problem List   Diagnosis Date Noted  . 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 Acute GI bleeding 10/13/2017  . ESRD on hemodialysis ([place]) 10/13/2017  . Embolism, arterial ([place]) 10/12/2017  . Acute encephalopathy 03/13/2016  . Acute hyperkalemia 03/13/2016  . Diabetes mellitus type 2, uncontrolled ([place]) 05/03/2014  . Non-ischemic  cardiomyopathy ([place]) 05/03/2014  . HYPERCHOLESTEROLEMIA 08/19/2009  . Essential hypertension 08/19/2009  . Chronic systolic heart failure ([place]) 08/19/2009  . CKD (chronic kidney disease) stage V requiring chronic dialysis ([place]) 08/19/2009  . Depression 08/19/2009    Past Surgical History:  Procedure Laterality Date  . APPENDECTOMY  09-12-2004   laparotomy w/ drainage peritinitis  . AV FISTULA PLACEMENT  02-27-2010   right forearm (RADIOCEPHALIC)  . AV FISTULA REPAIR  10-30-2010  . CARDIOVASCULAR STRESS TEST  10-29-2011   dr Daneen Schick   Low risk scan;  mild perfusion defect seen in the basal inferoseptal, basal inferior and mid inferior regions consistent with an infarct/scar and/or overlying attenuation/  mild to moderate global LVSF,  ef 40-45%  . DOBUTAMINE STRESS ECHO  07-23-2012   [name]   abnormal ;  at rest estimated lvef 25-30% and global severe LV hypokinesis ;  no cp during stress and achieved 85% maxium predicted heart rate;  negative stress ECG for inducible ischemia;  estimated lvef with stress 35-40%;  augmentation of wall segments consistant with cardiomyopathy and differential fibrosis  . FISTULOTOMY N/A 03/26/2015   Procedure: FISTULOTOMY;  Surgeon: [name], MD;  Location: [facility];  Service: General;  Laterality: N/A;  . INCISION AND DRAINAGE ABSCESS N/A 03/26/2015   Procedure: ANAL INCISION AND DRAINAGE;  Surgeon: [name], MD;  Location: [facility];  Service:  General;  Laterality: N/A;  . RETINAL DETACHMENT SURGERY Left 2011   incomplete repair/ needs eye drops to keep pressure down  . TEE WITHOUT CARDIOVERSION N/A 10/17/2017   Procedure: TRANSESOPHAGEAL ECHOCARDIOGRAM (TEE);  Surgeon: Josue Hector, MD;  Location: [facility] ENDOSCOPY;  Service: Cardiovascular;  Laterality: N/A;  . TRANSTHORACIC ECHOCARDIOGRAM  07-01-2014    done at [facility]   grade 1 diastolic dysfunction,  ef 45%/  trace TR and PR       Home  Medications    Prior to Admission medications   Medication Sig Start Date End Date Taking? Authorizing Provider  acetaminophen (TYLENOL) 500 MG tablet Take 1,000 mg by mouth as needed.    [provider]  aspirin 325 MG tablet Take 325 mg by mouth daily.    [provider]  calcium acetate (PHOSLO) 667 MG capsule Take 2,001 mg by mouth 3 (three) times daily with meals.  06/02/10   [provider]  cinacalcet (SENSIPAR) 60 MG tablet Take 60 mg by mouth daily.    [provider]  cycloSPORINE (RESTASIS) 0.05 % ophthalmic emulsion Place 1 drop into both eyes 2 (two) times daily. 04/22/14   [provider]  febuxostat (ULORIC) 40 MG tablet Take 40 mg by mouth daily.     [provider]  ferric citrate (AURYXIA) 1 GM 210 MG(Fe) tablet Take 420 mg by mouth 3 (three) times daily.    [provider]  gabapentin (NEURONTIN) 300 MG capsule Take 300 mg by mouth 3 (three) times daily as needed. For pain 05/09/15   [provider]  Insulin Aspart Prot & Aspart (NOVOLOG MIX 70/30 PENFILL [place]) Inject 14 Units into the skin 3 (three) times daily. Adjusts dose if CBG greater than 150    [provider]  LIPITOR 10 MG tablet Take 10 mg by mouth daily. 01/08/16   [provider]  midodrine (PROAMATINE) 5 MG tablet Take 5 mg by mouth Every Tuesday,Thursday,and Saturday with dialysis.  10/06/17   [provider]  Multiple Vitamins-Minerals (MEGA MULTIVITAMIN FOR MEN PO) Take 1 tablet by mouth daily.     [provider]  oxyCODONE-acetaminophen (PERCOCET) 10-325 MG tablet Take 1 tablet by mouth every 4 (four) hours as needed for pain.  07/04/18   [provider]  oxyCODONE-acetaminophen (PERCOCET/ROXICET) 5-325 MG tablet Take 1 tablet by mouth every 4 (four) hours as needed for severe pain. 11/26/18   Alroy Bailiff, [place], PA-C  sevelamer carbonate (RENVELA) 800 MG tablet Take 2,400 mg by mouth 3 (three) times daily  with meals.     [provider]    Family History Family History  Family history unknown: Yes    Social History Social History   Tobacco Use  . Smoking status: Never Smoker  . Smokeless tobacco: Never Used  Substance Use Topics  . Alcohol use: No  . Drug use: No     Allergies   Patient has no known allergies.   Review of Systems Review of Systems  Constitutional: Negative for activity change, appetite change, fatigue and fever.  Respiratory: Negative for cough and shortness of breath.   Cardiovascular: Negative for chest pain and palpitations.  Gastrointestinal: Negative for abdominal pain, diarrhea and vomiting.  Genitourinary: Positive for genital sores. Negative for discharge, penile pain, penile swelling, scrotal swelling and testicular pain.  Musculoskeletal: Negative for arthralgias and myalgias.  Skin: Negative for rash and wound.  Neurological: Negative for speech difficulty and headaches.  All other systems  reviewed and are negative.    Physical Exam Triage Vital Signs ED Triage Vitals  Enc Vitals Group     BP 12/15/18 0930 115/71     Pulse Rate 12/15/18 0930 (!) 110     Resp 12/15/18 0930 16     Temp 12/15/18 0930 98.3 F (36.8 C)     Temp Source 12/15/18 0930 Oral     SpO2 12/15/18 0930 96 %     Weight --      Height --      Head Circumference --      Peak Flow --      Pain Score 12/15/18 0934 9     Pain Loc --      Pain Edu? --      Excl. in [place]? --    No data found.  Updated Vital Signs BP 115/71 (BP Location: Left Arm)   Pulse (!) 110   Temp 98.3 F (36.8 C) (Oral)   Resp 16   SpO2 96%    Physical Exam Constitutional:      General: He is not in acute distress.    Appearance: He is obese. He is not ill-appearing.  HENT:     Head: Normocephalic and atraumatic.  Eyes:     General: No scleral icterus.    Pupils: Pupils are equal, round, and reactive to light.  Cardiovascular:     Rate and Rhythm: Normal rate.   Pulmonary:     Effort: Pulmonary effort is normal. No respiratory distress.     Breath sounds: No wheezing.  Abdominal:     General: Bowel sounds are normal.     Tenderness: There is no abdominal tenderness. There is no guarding.  Genitourinary:    Scrotum/Testes: Normal.     Comments: 2 cm superficial ulcer on left lateral glans w/ TTP.  No nodularity, necrosis, erythema, or discharge Skin:    Capillary Refill: Capillary refill takes less than 2 seconds.     Coloration: Skin is not jaundiced or pale.  Neurological:     Mental Status: He is alert and oriented to person, place, and time.      UC Treatments / Results  Labs (all labs ordered are listed, but only abnormal results are displayed) Labs Reviewed - No data to display  EKG   Radiology No results found.  Procedures Procedures (including critical care time)  Medications Ordered in UC Medications  azithromycin (ZITHROMAX) tablet 1,000 mg (1,000 mg Oral Given 12/15/18 1020)    Initial Impression / Assessment and Plan / UC Course  I have reviewed the triage vital signs and the nursing notes.  Pertinent labs & imaging results that were available during my care of the patient were reviewed by me and considered in my medical decision making (see chart for details).     1.  Chancroid  Reviewed case with Dr. Lanny Cramp who agreed with assessment/plan: No skin nodules on exam, history of calcium nodules, so lower concern for ulcerated calcium nodule.  Patient has undergone STD screening before with negative results, celibate x6 years, and has not urinated in 6 years: Low concern for urethritis, balanitis, syphilis.  Treated for chancroid with 1 g azithromycin, which patient was given in office and tolerated well.  Return precautions discussed, patient verbalized understanding and is agreeable to plan. Final Clinical Impressions(s) / UC Diagnoses   Final diagnoses:  Chancroid in male     Discharge Instructions     Keep  ear clean and dry. Important  to pat the area dry after bathing. May use hot water soaks for pain alleviation. You were treated today for your chancroid with an antibiotic: Important to follow-up with your primary care doctor. Return for worsening pain, bleeding, saccular pain/swelling, abdominal pain, fever.    ED Prescriptions    None     PDMP not reviewed this encounter.   Hall-Potvin, [place], PA-C 12/15/18 1124    Hall-Potvin, [place], [place] 12/15/18 1125

## 2018-12-16 DIAGNOSIS — D631 Anemia in chronic kidney disease: Secondary | ICD-10-CM | POA: Diagnosis not present

## 2018-12-16 DIAGNOSIS — N2581 Secondary hyperparathyroidism of renal origin: Secondary | ICD-10-CM | POA: Diagnosis not present

## 2018-12-16 DIAGNOSIS — D509 Iron deficiency anemia, unspecified: Secondary | ICD-10-CM | POA: Diagnosis not present

## 2018-12-16 DIAGNOSIS — N186 End stage renal disease: Secondary | ICD-10-CM | POA: Diagnosis not present

## 2018-12-18 ENCOUNTER — Emergency Department (HOSPITAL_COMMUNITY)
Admission: EM | Admit: 2018-12-18 | Discharge: 2018-12-18 | Disposition: A | Discharge status: Home / Self Care | Payer: Medicare Other | Attending: Emergency Medicine | Admitting: Emergency Medicine

## 2018-12-18 ENCOUNTER — Encounter (HOSPITAL_COMMUNITY): Payer: Self-pay | Admitting: Emergency Medicine

## 2018-12-18 ENCOUNTER — Other Ambulatory Visit: Payer: Self-pay

## 2018-12-18 DIAGNOSIS — N186 End stage renal disease: Secondary | ICD-10-CM | POA: Insufficient documentation

## 2018-12-18 DIAGNOSIS — E119 Type 2 diabetes mellitus without complications: Secondary | ICD-10-CM | POA: Insufficient documentation

## 2018-12-18 DIAGNOSIS — I5042 Chronic combined systolic (congestive) and diastolic (congestive) heart failure: Secondary | ICD-10-CM | POA: Insufficient documentation

## 2018-12-18 DIAGNOSIS — Z79899 Other long term (current) drug therapy: Secondary | ICD-10-CM | POA: Insufficient documentation

## 2018-12-18 DIAGNOSIS — Z794 Long term (current) use of insulin: Secondary | ICD-10-CM | POA: Diagnosis not present

## 2018-12-18 DIAGNOSIS — N4889 Other specified disorders of penis: Secondary | ICD-10-CM | POA: Insufficient documentation

## 2018-12-18 DIAGNOSIS — I132 Hypertensive heart and chronic kidney disease with heart failure and with stage 5 chronic kidney disease, or end stage renal disease: Secondary | ICD-10-CM | POA: Insufficient documentation

## 2018-12-18 DIAGNOSIS — Z992 Dependence on renal dialysis: Secondary | ICD-10-CM | POA: Insufficient documentation

## 2018-12-18 MED ORDER — HYDROCODONE-ACETAMINOPHEN 5-325 MG PO TABS: 2.0000 | ORAL_TABLET | Freq: Four times a day (QID) | ORAL | 0 refills | Status: DC | PRN

## 2018-12-18 MED ORDER — DOXYCYCLINE HYCLATE 100 MG PO CAPS: 100.0000 mg | ORAL_CAPSULE | Freq: Two times a day (BID) | ORAL | 0 refills | Status: AC

## 2018-12-18 NOTE — ED Triage Notes (Signed)
Per pt, states the tip of his penis hurts-states he was seen at [facility] and was given a shot of "something"-states he is still having pain

## 2018-12-18 NOTE — ED Provider Notes (Signed)
[facility] DEPT Provider Note   CSN: [id number] Arrival date & time: 12/18/18  0737     History   Chief Complaint Chief Complaint  Patient presents with  . Penis Pain    HPI [name] [name] is a 53 y.o. male.     The history is provided by the patient.  Penis Pain This is a chronic problem. The current episode started more than 1 week ago. The problem occurs daily. The problem has been gradually improving. Pertinent negatives include no chest pain, no abdominal pain, no headaches and no shortness of breath. Nothing aggravates the symptoms. Nothing relieves the symptoms. Treatments tried: had one dose of antibiotics. The treatment provided mild relief.    Past Medical History:  Diagnosis Date  . Anal infection    posterior anal canal  . Anemia, chronic renal failure   . Atrophic kidney    BILATERAL  . DM type 2 causing ESRD [facility])    Nephrologist-- dr [name] [facility])--  on hemodialysis since June 2012 at  [name]  TTS  . Hemodialysis patient [facility])    at [place] on Tues/ Thur/Sat/schedule  . Hemorrhoids   . Hepatitis B antibody positive   . History of pleural effusion    bilateral  . Hyperparathyroidism, secondary renal ([place])   . Hypertension   . Ischemic cardiomyopathy    per echo 07-01-2014  ef 45%  . LAFB (left anterior fascicular block)   . Peripheral neuropathy   . Systolic and diastolic CHF, chronic ([place])    CARDIOLOGIST-  DR Daneen Schick ([place])  AND DR [name] ([name])    Patient Active Problem List   Diagnosis Date Noted  . 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 Acute GI bleeding 10/13/2017  . ESRD on hemodialysis ([place]) 10/13/2017  . Embolism, arterial ([place]) 10/12/2017  . Acute encephalopathy 03/13/2016  . Acute hyperkalemia 03/13/2016  . Diabetes mellitus type 2, uncontrolled ([place]) 05/03/2014  . Non-ischemic cardiomyopathy ([place])  05/03/2014  . HYPERCHOLESTEROLEMIA 08/19/2009  . Essential hypertension 08/19/2009  . Chronic systolic heart failure ([place]) 08/19/2009  . CKD (chronic kidney disease) stage V requiring chronic dialysis ([place]) 08/19/2009  . Depression 08/19/2009    Past Surgical History:  Procedure Laterality Date  . APPENDECTOMY  09-12-2004   laparotomy w/ drainage peritinitis  . AV FISTULA PLACEMENT  02-27-2010   right forearm (RADIOCEPHALIC)  . AV FISTULA REPAIR  10-30-2010  . CARDIOVASCULAR STRESS TEST  10-29-2011   dr Daneen Schick   Low risk scan;  mild perfusion defect seen in the basal inferoseptal, basal inferior and mid inferior regions consistent with an infarct/scar and/or overlying attenuation/  mild to moderate global LVSF,  ef 40-45%  . DOBUTAMINE STRESS ECHO  07-23-2012   [name]   abnormal ;  at rest estimated lvef 25-30% and global severe LV hypokinesis ;  no cp during stress and achieved 85% maxium predicted heart rate;  negative stress ECG for inducible ischemia;  estimated lvef with stress 35-40%;  augmentation of wall segments consistant with cardiomyopathy and differential fibrosis  . FISTULOTOMY N/A 03/26/2015   Procedure: FISTULOTOMY;  Surgeon: [name], MD;  Location[facility];  Service: General;  Laterality: N/A;  . INCISION AND DRAINAGE ABSCESS N/A 03/26/2015   Procedure: ANAL INCISION AND DRAINAGE;  Surgeon: [name], MD;  Location: [facility];  Service: General;  Laterality: N/A;  . RETINAL DETACHMENT  SURGERY Left 2011   incomplete repair/ needs eye drops to keep pressure down  . TEE WITHOUT CARDIOVERSION N/A 10/17/2017   Procedure: TRANSESOPHAGEAL ECHOCARDIOGRAM (TEE);  Surgeon: Josue Hector, MD;  Location: [facility] ENDOSCOPY;  Service: Cardiovascular;  Laterality: N/A;  . TRANSTHORACIC ECHOCARDIOGRAM  07-01-2014    done at [facility]   grade 1 diastolic dysfunction,  ef 45%/  trace TR and PR        Home Medications     Prior to Admission medications   Medication Sig Start Date End Date Taking? Authorizing Provider  acetaminophen (TYLENOL) 500 MG tablet Take 1,000 mg by mouth as needed.    [provider]  aspirin 325 MG tablet Take 325 mg by mouth daily.    [provider]  calcium acetate (PHOSLO) 667 MG capsule Take 2,001 mg by mouth 3 (three) times daily with meals.  06/02/10   [provider]  cinacalcet (SENSIPAR) 60 MG tablet Take 60 mg by mouth daily.    [provider]  cycloSPORINE (RESTASIS) 0.05 % ophthalmic emulsion Place 1 drop into both eyes 2 (two) times daily. 04/22/14   [provider]  doxycycline (VIBRAMYCIN) 100 MG capsule Take 1 capsule (100 mg total) by mouth 2 (two) times daily for 7 days. 12/18/18 12/25/18  Starasia Sinko, DO  febuxostat (ULORIC) 40 MG tablet Take 40 mg by mouth daily.     [provider]  ferric citrate (AURYXIA) 1 GM 210 MG(Fe) tablet Take 420 mg by mouth 3 (three) times daily.    [provider]  gabapentin (NEURONTIN) 300 MG capsule Take 300 mg by mouth 3 (three) times daily as needed. For pain 05/09/15   [provider]  HYDROcodone-acetaminophen (NORCO/VICODIN) 5-325 MG tablet Take 2 tablets by mouth every 6 (six) hours as needed for up to 10 doses. 12/18/18   Saleema Weppler, DO  Insulin Aspart Prot & Aspart (NOVOLOG MIX 70/30 PENFILL [place]) Inject 14 Units into the skin 3 (three) times daily. Adjusts dose if CBG greater than 150    [provider]  LIPITOR 10 MG tablet Take 10 mg by mouth daily. 01/08/16   [provider]  midodrine (PROAMATINE) 5 MG tablet Take 5 mg by mouth Every Tuesday,Thursday,and Saturday with dialysis.  10/06/17   [provider]  Multiple Vitamins-Minerals (MEGA MULTIVITAMIN FOR MEN PO) Take 1 tablet by mouth daily.     [provider]  oxyCODONE-acetaminophen (PERCOCET) 10-325 MG tablet Take 1 tablet by mouth every 4 (four) hours as needed for  pain.  07/04/18   [provider]  oxyCODONE-acetaminophen (PERCOCET/ROXICET) 5-325 MG tablet Take 1 tablet by mouth every 4 (four) hours as needed for severe pain. 11/26/18   Alroy Bailiff, [place], PA-C  sevelamer carbonate (RENVELA) 800 MG tablet Take 2,400 mg by mouth 3 (three) times daily with meals.     [provider]    Family History Family History  Family history unknown: Yes    Social History Social History   Tobacco Use  . Smoking status: Never Smoker  . Smokeless tobacco: Never Used  Substance Use Topics  . Alcohol use: No  . Drug use: No     Allergies   Patient has no known allergies.   Review of Systems Review of Systems  Constitutional: Negative for chills and fever.  HENT: Negative for ear pain and sore throat.   Eyes: Negative for pain and visual disturbance.  Respiratory: Negative for cough and shortness of breath.  Cardiovascular: Negative for chest pain and palpitations.  Gastrointestinal: Negative for abdominal pain and vomiting.  Genitourinary: Positive for penile pain. Negative for discharge, genital sores, hematuria, penile swelling, scrotal swelling and testicular pain.  Musculoskeletal: Negative for arthralgias and back pain.  Skin: Negative for color change and rash.  Neurological: Negative for seizures, syncope and headaches.  All other systems reviewed and are negative.    Physical Exam Updated Vital Signs  ED Triage Vitals  Enc Vitals Group     BP 12/18/18 0749 (!) 118/51     Pulse Rate 12/18/18 0749 100     Resp 12/18/18 0749 16     Temp 12/18/18 0749 99 F (37.2 C)     Temp Source 12/18/18 0749 Oral     SpO2 12/18/18 0749 100 %     Weight --      Height --      Head Circumference --      Peak Flow --      Pain Score 12/18/18 0751 10     Pain Loc --      Pain Edu? --      Excl. in [place]? --     Physical Exam Vitals signs and nursing note reviewed.  Constitutional:      Appearance: He is well-developed.  HENT:      Head: Normocephalic and atraumatic.  Eyes:     Conjunctiva/sclera: Conjunctivae normal.  Neck:     Musculoskeletal: Neck supple.  Cardiovascular:     Rate and Rhythm: Normal rate and regular rhythm.     Heart sounds: No murmur.  Pulmonary:     Effort: Pulmonary effort is normal. No respiratory distress.     Breath sounds: Normal breath sounds.  Abdominal:     Palpations: Abdomen is soft.     Tenderness: There is no abdominal tenderness.  Genitourinary:    Scrotum/Testes: Normal.     Comments: Testicles with no tenderness, penis with no tenderness, no discharge from urethra, single canker over the glans of the penis that is overall well-appearing, slightly scabbed/chronic appearing Skin:    General: Skin is warm and dry.  Neurological:     Mental Status: He is alert.      ED Treatments / Results  Labs (all labs ordered are listed, but only abnormal results are displayed) Labs Reviewed - No data to display  EKG None  Radiology No results found.  Procedures Procedures (including critical care time)  Medications Ordered in ED Medications - No data to display   Initial Impression / Assessment and Plan / ED Course  I have reviewed the triage vital signs and the nursing notes.  Pertinent labs & imaging results that were available during my care of the patient were reviewed by me and considered in my medical decision making (see chart for details).     NAHIR DAURIO is a 53 year old male with history of CKD on hemodialysis who presents the ED with penile pain.  Patient with normal vitals.  No fever.  Patient treated for possible STD for canker on his glans of his penis several days ago.  Overall ulcer appears to be improving per patient but he continues to have extreme pain.  He does not have any testicular tenderness, no urethral discharge.  Has not had sex in at least multiple years.  No history of herpes.  Has a canker on the glans of his penis that appears to be more  chronic than acute.  Will give further treatment  with doxycycline.  We will have him follow-up with urologist and his primary care doctor.  May need biopsy to rule out other process.  Given return precautions and discharged in ED in good condition.  Will prescribe narcotic pain medicine.  No active prescriptions on the database.  This chart was dictated using voice recognition software.  Despite best efforts to proofread,  errors can occur which can change the documentation meaning.    Final Clinical Impressions(s) / ED Diagnoses   Final diagnoses:  Penile pain    ED Discharge Orders         Ordered    HYDROcodone-acetaminophen (NORCO/VICODIN) 5-325 MG tablet  Every 6 hours PRN     12/18/18 0923    doxycycline (VIBRAMYCIN) 100 MG capsule  2 times daily     12/18/18 [place], [place], DO 12/18/18 [phone]

## 2018-12-19 DIAGNOSIS — N2581 Secondary hyperparathyroidism of renal origin: Secondary | ICD-10-CM | POA: Diagnosis not present

## 2018-12-19 DIAGNOSIS — D631 Anemia in chronic kidney disease: Secondary | ICD-10-CM | POA: Diagnosis not present

## 2018-12-19 DIAGNOSIS — N186 End stage renal disease: Secondary | ICD-10-CM | POA: Diagnosis not present

## 2018-12-19 DIAGNOSIS — D509 Iron deficiency anemia, unspecified: Secondary | ICD-10-CM | POA: Diagnosis not present

## 2018-12-20 DIAGNOSIS — Z6841 Body Mass Index (BMI) 40.0 and over, adult: Secondary | ICD-10-CM | POA: Insufficient documentation

## 2018-12-20 DIAGNOSIS — Z992 Dependence on renal dialysis: Secondary | ICD-10-CM | POA: Diagnosis not present

## 2018-12-20 DIAGNOSIS — N186 End stage renal disease: Secondary | ICD-10-CM | POA: Diagnosis not present

## 2018-12-21 DIAGNOSIS — Z23 Encounter for immunization: Secondary | ICD-10-CM | POA: Diagnosis not present

## 2018-12-21 DIAGNOSIS — E785 Hyperlipidemia, unspecified: Secondary | ICD-10-CM | POA: Diagnosis not present

## 2018-12-21 DIAGNOSIS — N186 End stage renal disease: Secondary | ICD-10-CM | POA: Diagnosis not present

## 2018-12-21 DIAGNOSIS — N2581 Secondary hyperparathyroidism of renal origin: Secondary | ICD-10-CM | POA: Diagnosis not present

## 2018-12-21 DIAGNOSIS — D631 Anemia in chronic kidney disease: Secondary | ICD-10-CM | POA: Diagnosis not present

## 2018-12-23 DIAGNOSIS — N186 End stage renal disease: Secondary | ICD-10-CM | POA: Diagnosis not present

## 2018-12-23 DIAGNOSIS — N2581 Secondary hyperparathyroidism of renal origin: Secondary | ICD-10-CM | POA: Diagnosis not present

## 2018-12-23 DIAGNOSIS — D631 Anemia in chronic kidney disease: Secondary | ICD-10-CM | POA: Diagnosis not present

## 2018-12-23 DIAGNOSIS — Z23 Encounter for immunization: Secondary | ICD-10-CM | POA: Diagnosis not present

## 2018-12-23 DIAGNOSIS — E785 Hyperlipidemia, unspecified: Secondary | ICD-10-CM | POA: Diagnosis not present

## 2018-12-26 DIAGNOSIS — N186 End stage renal disease: Secondary | ICD-10-CM | POA: Diagnosis not present

## 2018-12-26 DIAGNOSIS — N2581 Secondary hyperparathyroidism of renal origin: Secondary | ICD-10-CM | POA: Diagnosis not present

## 2018-12-26 DIAGNOSIS — Z23 Encounter for immunization: Secondary | ICD-10-CM | POA: Diagnosis not present

## 2018-12-26 DIAGNOSIS — D631 Anemia in chronic kidney disease: Secondary | ICD-10-CM | POA: Diagnosis not present

## 2018-12-26 DIAGNOSIS — E785 Hyperlipidemia, unspecified: Secondary | ICD-10-CM | POA: Diagnosis not present

## 2018-12-28 DIAGNOSIS — Z23 Encounter for immunization: Secondary | ICD-10-CM | POA: Diagnosis not present

## 2018-12-28 DIAGNOSIS — E119 Type 2 diabetes mellitus without complications: Secondary | ICD-10-CM | POA: Diagnosis not present

## 2018-12-28 DIAGNOSIS — N2581 Secondary hyperparathyroidism of renal origin: Secondary | ICD-10-CM | POA: Diagnosis not present

## 2018-12-28 DIAGNOSIS — D631 Anemia in chronic kidney disease: Secondary | ICD-10-CM | POA: Diagnosis not present

## 2018-12-28 DIAGNOSIS — E785 Hyperlipidemia, unspecified: Secondary | ICD-10-CM | POA: Diagnosis not present

## 2018-12-28 DIAGNOSIS — N186 End stage renal disease: Secondary | ICD-10-CM | POA: Diagnosis not present

## 2018-12-28 DIAGNOSIS — L942 Calcinosis cutis: Secondary | ICD-10-CM | POA: Diagnosis not present

## 2018-12-28 DIAGNOSIS — E1121 Type 2 diabetes mellitus with diabetic nephropathy: Secondary | ICD-10-CM | POA: Diagnosis not present

## 2018-12-30 DIAGNOSIS — N186 End stage renal disease: Secondary | ICD-10-CM | POA: Diagnosis not present

## 2018-12-30 DIAGNOSIS — Z23 Encounter for immunization: Secondary | ICD-10-CM | POA: Diagnosis not present

## 2018-12-30 DIAGNOSIS — N2581 Secondary hyperparathyroidism of renal origin: Secondary | ICD-10-CM | POA: Diagnosis not present

## 2018-12-30 DIAGNOSIS — D631 Anemia in chronic kidney disease: Secondary | ICD-10-CM | POA: Diagnosis not present

## 2018-12-30 DIAGNOSIS — E785 Hyperlipidemia, unspecified: Secondary | ICD-10-CM | POA: Diagnosis not present

## 2019-01-02 DIAGNOSIS — N186 End stage renal disease: Secondary | ICD-10-CM | POA: Diagnosis not present

## 2019-01-02 DIAGNOSIS — Z23 Encounter for immunization: Secondary | ICD-10-CM | POA: Diagnosis not present

## 2019-01-02 DIAGNOSIS — N2581 Secondary hyperparathyroidism of renal origin: Secondary | ICD-10-CM | POA: Diagnosis not present

## 2019-01-02 DIAGNOSIS — E785 Hyperlipidemia, unspecified: Secondary | ICD-10-CM | POA: Diagnosis not present

## 2019-01-02 DIAGNOSIS — D631 Anemia in chronic kidney disease: Secondary | ICD-10-CM | POA: Diagnosis not present

## 2019-01-04 DIAGNOSIS — N2581 Secondary hyperparathyroidism of renal origin: Secondary | ICD-10-CM | POA: Diagnosis not present

## 2019-01-04 DIAGNOSIS — Z23 Encounter for immunization: Secondary | ICD-10-CM | POA: Diagnosis not present

## 2019-01-04 DIAGNOSIS — N186 End stage renal disease: Secondary | ICD-10-CM | POA: Diagnosis not present

## 2019-01-04 DIAGNOSIS — E785 Hyperlipidemia, unspecified: Secondary | ICD-10-CM | POA: Diagnosis not present

## 2019-01-04 DIAGNOSIS — D631 Anemia in chronic kidney disease: Secondary | ICD-10-CM | POA: Diagnosis not present

## 2019-01-06 DIAGNOSIS — N2581 Secondary hyperparathyroidism of renal origin: Secondary | ICD-10-CM | POA: Diagnosis not present

## 2019-01-06 DIAGNOSIS — E785 Hyperlipidemia, unspecified: Secondary | ICD-10-CM | POA: Diagnosis not present

## 2019-01-06 DIAGNOSIS — N186 End stage renal disease: Secondary | ICD-10-CM | POA: Diagnosis not present

## 2019-01-06 DIAGNOSIS — D631 Anemia in chronic kidney disease: Secondary | ICD-10-CM | POA: Diagnosis not present

## 2019-01-06 DIAGNOSIS — Z23 Encounter for immunization: Secondary | ICD-10-CM | POA: Diagnosis not present

## 2019-01-09 DIAGNOSIS — E785 Hyperlipidemia, unspecified: Secondary | ICD-10-CM | POA: Diagnosis not present

## 2019-01-09 DIAGNOSIS — D631 Anemia in chronic kidney disease: Secondary | ICD-10-CM | POA: Diagnosis not present

## 2019-01-09 DIAGNOSIS — N2581 Secondary hyperparathyroidism of renal origin: Secondary | ICD-10-CM | POA: Diagnosis not present

## 2019-01-09 DIAGNOSIS — N186 End stage renal disease: Secondary | ICD-10-CM | POA: Diagnosis not present

## 2019-01-09 DIAGNOSIS — Z23 Encounter for immunization: Secondary | ICD-10-CM | POA: Diagnosis not present

## 2019-01-11 DIAGNOSIS — Z23 Encounter for immunization: Secondary | ICD-10-CM | POA: Diagnosis not present

## 2019-01-11 DIAGNOSIS — N2581 Secondary hyperparathyroidism of renal origin: Secondary | ICD-10-CM | POA: Diagnosis not present

## 2019-01-11 DIAGNOSIS — D631 Anemia in chronic kidney disease: Secondary | ICD-10-CM | POA: Diagnosis not present

## 2019-01-11 DIAGNOSIS — N186 End stage renal disease: Secondary | ICD-10-CM | POA: Diagnosis not present

## 2019-01-11 DIAGNOSIS — E785 Hyperlipidemia, unspecified: Secondary | ICD-10-CM | POA: Diagnosis not present

## 2019-01-13 DIAGNOSIS — N186 End stage renal disease: Secondary | ICD-10-CM | POA: Diagnosis not present

## 2019-01-13 DIAGNOSIS — E785 Hyperlipidemia, unspecified: Secondary | ICD-10-CM | POA: Diagnosis not present

## 2019-01-13 DIAGNOSIS — Z23 Encounter for immunization: Secondary | ICD-10-CM | POA: Diagnosis not present

## 2019-01-13 DIAGNOSIS — N2581 Secondary hyperparathyroidism of renal origin: Secondary | ICD-10-CM | POA: Diagnosis not present

## 2019-01-13 DIAGNOSIS — D631 Anemia in chronic kidney disease: Secondary | ICD-10-CM | POA: Diagnosis not present

## 2019-01-16 DIAGNOSIS — N186 End stage renal disease: Secondary | ICD-10-CM | POA: Diagnosis not present

## 2019-01-16 DIAGNOSIS — N2581 Secondary hyperparathyroidism of renal origin: Secondary | ICD-10-CM | POA: Diagnosis not present

## 2019-01-16 DIAGNOSIS — D631 Anemia in chronic kidney disease: Secondary | ICD-10-CM | POA: Diagnosis not present

## 2019-01-16 DIAGNOSIS — Z23 Encounter for immunization: Secondary | ICD-10-CM | POA: Diagnosis not present

## 2019-01-16 DIAGNOSIS — E785 Hyperlipidemia, unspecified: Secondary | ICD-10-CM | POA: Diagnosis not present

## 2019-01-18 DIAGNOSIS — N2581 Secondary hyperparathyroidism of renal origin: Secondary | ICD-10-CM | POA: Diagnosis not present

## 2019-01-18 DIAGNOSIS — D631 Anemia in chronic kidney disease: Secondary | ICD-10-CM | POA: Diagnosis not present

## 2019-01-18 DIAGNOSIS — N186 End stage renal disease: Secondary | ICD-10-CM | POA: Diagnosis not present

## 2019-01-18 DIAGNOSIS — E785 Hyperlipidemia, unspecified: Secondary | ICD-10-CM | POA: Diagnosis not present

## 2019-01-18 DIAGNOSIS — Z23 Encounter for immunization: Secondary | ICD-10-CM | POA: Diagnosis not present

## 2019-01-20 DIAGNOSIS — Z23 Encounter for immunization: Secondary | ICD-10-CM | POA: Diagnosis not present

## 2019-01-20 DIAGNOSIS — N2581 Secondary hyperparathyroidism of renal origin: Secondary | ICD-10-CM | POA: Diagnosis not present

## 2019-01-20 DIAGNOSIS — Z992 Dependence on renal dialysis: Secondary | ICD-10-CM | POA: Diagnosis not present

## 2019-01-20 DIAGNOSIS — N186 End stage renal disease: Secondary | ICD-10-CM | POA: Diagnosis not present

## 2019-01-20 DIAGNOSIS — E785 Hyperlipidemia, unspecified: Secondary | ICD-10-CM | POA: Diagnosis not present

## 2019-01-20 DIAGNOSIS — D631 Anemia in chronic kidney disease: Secondary | ICD-10-CM | POA: Diagnosis not present

## 2019-01-23 DIAGNOSIS — D631 Anemia in chronic kidney disease: Secondary | ICD-10-CM | POA: Diagnosis not present

## 2019-01-23 DIAGNOSIS — N2581 Secondary hyperparathyroidism of renal origin: Secondary | ICD-10-CM | POA: Diagnosis not present

## 2019-01-23 DIAGNOSIS — N186 End stage renal disease: Secondary | ICD-10-CM | POA: Diagnosis not present

## 2019-01-25 DIAGNOSIS — N186 End stage renal disease: Secondary | ICD-10-CM | POA: Diagnosis not present

## 2019-01-25 DIAGNOSIS — N2581 Secondary hyperparathyroidism of renal origin: Secondary | ICD-10-CM | POA: Diagnosis not present

## 2019-01-25 DIAGNOSIS — D631 Anemia in chronic kidney disease: Secondary | ICD-10-CM | POA: Diagnosis not present

## 2019-01-25 DIAGNOSIS — E119 Type 2 diabetes mellitus without complications: Secondary | ICD-10-CM | POA: Diagnosis not present

## 2019-01-27 DIAGNOSIS — N2581 Secondary hyperparathyroidism of renal origin: Secondary | ICD-10-CM | POA: Diagnosis not present

## 2019-01-27 DIAGNOSIS — D631 Anemia in chronic kidney disease: Secondary | ICD-10-CM | POA: Diagnosis not present

## 2019-01-27 DIAGNOSIS — N186 End stage renal disease: Secondary | ICD-10-CM | POA: Diagnosis not present

## 2019-01-29 DIAGNOSIS — Z79899 Other long term (current) drug therapy: Secondary | ICD-10-CM | POA: Diagnosis not present

## 2019-01-29 DIAGNOSIS — I739 Peripheral vascular disease, unspecified: Secondary | ICD-10-CM | POA: Diagnosis not present

## 2019-01-29 DIAGNOSIS — I8390 Asymptomatic varicose veins of unspecified lower extremity: Secondary | ICD-10-CM | POA: Diagnosis not present

## 2019-01-29 DIAGNOSIS — N186 End stage renal disease: Secondary | ICD-10-CM | POA: Diagnosis not present

## 2019-01-29 DIAGNOSIS — E876 Hypokalemia: Secondary | ICD-10-CM | POA: Diagnosis not present

## 2019-01-29 DIAGNOSIS — E1140 Type 2 diabetes mellitus with diabetic neuropathy, unspecified: Secondary | ICD-10-CM | POA: Diagnosis not present

## 2019-01-29 DIAGNOSIS — Z79891 Long term (current) use of opiate analgesic: Secondary | ICD-10-CM | POA: Diagnosis not present

## 2019-01-29 DIAGNOSIS — M79669 Pain in unspecified lower leg: Secondary | ICD-10-CM | POA: Diagnosis not present

## 2019-01-29 DIAGNOSIS — I5030 Unspecified diastolic (congestive) heart failure: Secondary | ICD-10-CM | POA: Diagnosis not present

## 2019-01-29 DIAGNOSIS — M159 Polyosteoarthritis, unspecified: Secondary | ICD-10-CM | POA: Diagnosis not present

## 2019-01-29 DIAGNOSIS — E7800 Pure hypercholesterolemia, unspecified: Secondary | ICD-10-CM | POA: Diagnosis not present

## 2019-01-29 DIAGNOSIS — M79643 Pain in unspecified hand: Secondary | ICD-10-CM | POA: Diagnosis not present

## 2019-01-30 DIAGNOSIS — N2581 Secondary hyperparathyroidism of renal origin: Secondary | ICD-10-CM | POA: Diagnosis not present

## 2019-01-30 DIAGNOSIS — D631 Anemia in chronic kidney disease: Secondary | ICD-10-CM | POA: Diagnosis not present

## 2019-01-30 DIAGNOSIS — N186 End stage renal disease: Secondary | ICD-10-CM | POA: Diagnosis not present

## 2019-02-01 DIAGNOSIS — D631 Anemia in chronic kidney disease: Secondary | ICD-10-CM | POA: Diagnosis not present

## 2019-02-01 DIAGNOSIS — N2581 Secondary hyperparathyroidism of renal origin: Secondary | ICD-10-CM | POA: Diagnosis not present

## 2019-02-01 DIAGNOSIS — N186 End stage renal disease: Secondary | ICD-10-CM | POA: Diagnosis not present

## 2019-02-03 DIAGNOSIS — N186 End stage renal disease: Secondary | ICD-10-CM | POA: Diagnosis not present

## 2019-02-03 DIAGNOSIS — N2581 Secondary hyperparathyroidism of renal origin: Secondary | ICD-10-CM | POA: Diagnosis not present

## 2019-02-03 DIAGNOSIS — D631 Anemia in chronic kidney disease: Secondary | ICD-10-CM | POA: Diagnosis not present

## 2019-02-05 DIAGNOSIS — N186 End stage renal disease: Secondary | ICD-10-CM | POA: Diagnosis not present

## 2019-02-05 DIAGNOSIS — D631 Anemia in chronic kidney disease: Secondary | ICD-10-CM | POA: Diagnosis not present

## 2019-02-05 DIAGNOSIS — N2581 Secondary hyperparathyroidism of renal origin: Secondary | ICD-10-CM | POA: Diagnosis not present

## 2019-02-08 DIAGNOSIS — N186 End stage renal disease: Secondary | ICD-10-CM | POA: Diagnosis not present

## 2019-02-08 DIAGNOSIS — N2581 Secondary hyperparathyroidism of renal origin: Secondary | ICD-10-CM | POA: Diagnosis not present

## 2019-02-08 DIAGNOSIS — D631 Anemia in chronic kidney disease: Secondary | ICD-10-CM | POA: Diagnosis not present

## 2019-02-09 DIAGNOSIS — N2581 Secondary hyperparathyroidism of renal origin: Secondary | ICD-10-CM | POA: Diagnosis not present

## 2019-02-09 DIAGNOSIS — D631 Anemia in chronic kidney disease: Secondary | ICD-10-CM | POA: Diagnosis not present

## 2019-02-09 DIAGNOSIS — N186 End stage renal disease: Secondary | ICD-10-CM | POA: Diagnosis not present

## 2019-02-12 DIAGNOSIS — D631 Anemia in chronic kidney disease: Secondary | ICD-10-CM | POA: Diagnosis not present

## 2019-02-12 DIAGNOSIS — N186 End stage renal disease: Secondary | ICD-10-CM | POA: Diagnosis not present

## 2019-02-12 DIAGNOSIS — N2581 Secondary hyperparathyroidism of renal origin: Secondary | ICD-10-CM | POA: Diagnosis not present

## 2019-02-14 DIAGNOSIS — N186 End stage renal disease: Secondary | ICD-10-CM | POA: Diagnosis not present

## 2019-02-14 DIAGNOSIS — N2581 Secondary hyperparathyroidism of renal origin: Secondary | ICD-10-CM | POA: Diagnosis not present

## 2019-02-14 DIAGNOSIS — D631 Anemia in chronic kidney disease: Secondary | ICD-10-CM | POA: Diagnosis not present

## 2019-02-17 DIAGNOSIS — D631 Anemia in chronic kidney disease: Secondary | ICD-10-CM | POA: Diagnosis not present

## 2019-02-17 DIAGNOSIS — N2581 Secondary hyperparathyroidism of renal origin: Secondary | ICD-10-CM | POA: Diagnosis not present

## 2019-02-17 DIAGNOSIS — N186 End stage renal disease: Secondary | ICD-10-CM | POA: Diagnosis not present

## 2019-02-19 DIAGNOSIS — N2581 Secondary hyperparathyroidism of renal origin: Secondary | ICD-10-CM | POA: Diagnosis not present

## 2019-02-19 DIAGNOSIS — D631 Anemia in chronic kidney disease: Secondary | ICD-10-CM | POA: Diagnosis not present

## 2019-02-19 DIAGNOSIS — Z992 Dependence on renal dialysis: Secondary | ICD-10-CM | POA: Diagnosis not present

## 2019-02-19 DIAGNOSIS — N186 End stage renal disease: Secondary | ICD-10-CM | POA: Diagnosis not present

## 2019-02-22 DIAGNOSIS — Z125 Encounter for screening for malignant neoplasm of prostate: Secondary | ICD-10-CM | POA: Diagnosis not present

## 2019-03-25 DIAGNOSIS — D631 Anemia in chronic kidney disease: Secondary | ICD-10-CM | POA: Diagnosis not present

## 2019-03-25 DIAGNOSIS — E785 Hyperlipidemia, unspecified: Secondary | ICD-10-CM | POA: Diagnosis not present

## 2019-03-25 DIAGNOSIS — N186 End stage renal disease: Secondary | ICD-10-CM | POA: Diagnosis not present

## 2019-03-25 DIAGNOSIS — E8779 Other fluid overload: Secondary | ICD-10-CM | POA: Diagnosis not present

## 2019-03-25 DIAGNOSIS — N2581 Secondary hyperparathyroidism of renal origin: Secondary | ICD-10-CM | POA: Diagnosis not present

## 2019-03-27 DIAGNOSIS — D631 Anemia in chronic kidney disease: Secondary | ICD-10-CM | POA: Diagnosis not present

## 2019-03-27 DIAGNOSIS — N2581 Secondary hyperparathyroidism of renal origin: Secondary | ICD-10-CM | POA: Diagnosis not present

## 2019-03-27 DIAGNOSIS — E785 Hyperlipidemia, unspecified: Secondary | ICD-10-CM | POA: Diagnosis not present

## 2019-03-27 DIAGNOSIS — N186 End stage renal disease: Secondary | ICD-10-CM | POA: Diagnosis not present

## 2019-03-27 DIAGNOSIS — E8779 Other fluid overload: Secondary | ICD-10-CM | POA: Diagnosis not present

## 2019-03-29 DIAGNOSIS — E119 Type 2 diabetes mellitus without complications: Secondary | ICD-10-CM | POA: Diagnosis not present

## 2019-03-29 DIAGNOSIS — N186 End stage renal disease: Secondary | ICD-10-CM | POA: Diagnosis not present

## 2019-03-29 DIAGNOSIS — N2581 Secondary hyperparathyroidism of renal origin: Secondary | ICD-10-CM | POA: Diagnosis not present

## 2019-03-29 DIAGNOSIS — E785 Hyperlipidemia, unspecified: Secondary | ICD-10-CM | POA: Diagnosis not present

## 2019-03-29 DIAGNOSIS — D631 Anemia in chronic kidney disease: Secondary | ICD-10-CM | POA: Diagnosis not present

## 2019-03-29 DIAGNOSIS — E1121 Type 2 diabetes mellitus with diabetic nephropathy: Secondary | ICD-10-CM | POA: Diagnosis not present

## 2019-03-29 DIAGNOSIS — E8779 Other fluid overload: Secondary | ICD-10-CM | POA: Diagnosis not present

## 2019-03-31 DIAGNOSIS — N186 End stage renal disease: Secondary | ICD-10-CM | POA: Diagnosis not present

## 2019-03-31 DIAGNOSIS — E8779 Other fluid overload: Secondary | ICD-10-CM | POA: Diagnosis not present

## 2019-03-31 DIAGNOSIS — D631 Anemia in chronic kidney disease: Secondary | ICD-10-CM | POA: Diagnosis not present

## 2019-03-31 DIAGNOSIS — E785 Hyperlipidemia, unspecified: Secondary | ICD-10-CM | POA: Diagnosis not present

## 2019-03-31 DIAGNOSIS — N2581 Secondary hyperparathyroidism of renal origin: Secondary | ICD-10-CM | POA: Diagnosis not present

## 2019-04-03 DIAGNOSIS — N186 End stage renal disease: Secondary | ICD-10-CM | POA: Diagnosis not present

## 2019-04-03 DIAGNOSIS — D631 Anemia in chronic kidney disease: Secondary | ICD-10-CM | POA: Diagnosis not present

## 2019-04-03 DIAGNOSIS — N2581 Secondary hyperparathyroidism of renal origin: Secondary | ICD-10-CM | POA: Diagnosis not present

## 2019-04-03 DIAGNOSIS — E785 Hyperlipidemia, unspecified: Secondary | ICD-10-CM | POA: Diagnosis not present

## 2019-04-03 DIAGNOSIS — E8779 Other fluid overload: Secondary | ICD-10-CM | POA: Diagnosis not present

## 2019-04-05 DIAGNOSIS — E785 Hyperlipidemia, unspecified: Secondary | ICD-10-CM | POA: Diagnosis not present

## 2019-04-05 DIAGNOSIS — D631 Anemia in chronic kidney disease: Secondary | ICD-10-CM | POA: Diagnosis not present

## 2019-04-05 DIAGNOSIS — N2581 Secondary hyperparathyroidism of renal origin: Secondary | ICD-10-CM | POA: Diagnosis not present

## 2019-04-05 DIAGNOSIS — N186 End stage renal disease: Secondary | ICD-10-CM | POA: Diagnosis not present

## 2019-04-05 DIAGNOSIS — E8779 Other fluid overload: Secondary | ICD-10-CM | POA: Diagnosis not present

## 2019-04-07 DIAGNOSIS — E8779 Other fluid overload: Secondary | ICD-10-CM | POA: Diagnosis not present

## 2019-04-07 DIAGNOSIS — D631 Anemia in chronic kidney disease: Secondary | ICD-10-CM | POA: Diagnosis not present

## 2019-04-07 DIAGNOSIS — E785 Hyperlipidemia, unspecified: Secondary | ICD-10-CM | POA: Diagnosis not present

## 2019-04-07 DIAGNOSIS — N2581 Secondary hyperparathyroidism of renal origin: Secondary | ICD-10-CM | POA: Diagnosis not present

## 2019-04-07 DIAGNOSIS — N186 End stage renal disease: Secondary | ICD-10-CM | POA: Diagnosis not present

## 2019-04-10 DIAGNOSIS — N2581 Secondary hyperparathyroidism of renal origin: Secondary | ICD-10-CM | POA: Diagnosis not present

## 2019-04-10 DIAGNOSIS — D631 Anemia in chronic kidney disease: Secondary | ICD-10-CM | POA: Diagnosis not present

## 2019-04-10 DIAGNOSIS — E785 Hyperlipidemia, unspecified: Secondary | ICD-10-CM | POA: Diagnosis not present

## 2019-04-10 DIAGNOSIS — E8779 Other fluid overload: Secondary | ICD-10-CM | POA: Diagnosis not present

## 2019-04-10 DIAGNOSIS — N186 End stage renal disease: Secondary | ICD-10-CM | POA: Diagnosis not present

## 2019-04-12 ENCOUNTER — Other Ambulatory Visit: Payer: Self-pay

## 2019-04-12 ENCOUNTER — Emergency Department (HOSPITAL_COMMUNITY): Payer: Medicare Other

## 2019-04-12 ENCOUNTER — Emergency Department (HOSPITAL_COMMUNITY)
Admission: EM | Admit: 2019-04-12 | Discharge: 2019-04-13 | Disposition: A | Discharge status: Home / Self Care | Payer: Medicare Other | Attending: Emergency Medicine | Admitting: Emergency Medicine

## 2019-04-12 ENCOUNTER — Encounter (HOSPITAL_COMMUNITY): Payer: Self-pay | Admitting: Emergency Medicine

## 2019-04-12 DIAGNOSIS — D631 Anemia in chronic kidney disease: Secondary | ICD-10-CM | POA: Diagnosis not present

## 2019-04-12 DIAGNOSIS — Z7982 Long term (current) use of aspirin: Secondary | ICD-10-CM | POA: Insufficient documentation

## 2019-04-12 DIAGNOSIS — I4891 Unspecified atrial fibrillation: Secondary | ICD-10-CM | POA: Diagnosis not present

## 2019-04-12 DIAGNOSIS — Z79899 Other long term (current) drug therapy: Secondary | ICD-10-CM | POA: Insufficient documentation

## 2019-04-12 DIAGNOSIS — I5042 Chronic combined systolic (congestive) and diastolic (congestive) heart failure: Secondary | ICD-10-CM | POA: Insufficient documentation

## 2019-04-12 DIAGNOSIS — Z992 Dependence on renal dialysis: Secondary | ICD-10-CM | POA: Insufficient documentation

## 2019-04-12 DIAGNOSIS — R000 Tachycardia, unspecified: Secondary | ICD-10-CM | POA: Diagnosis not present

## 2019-04-12 DIAGNOSIS — R0602 Shortness of breath: Secondary | ICD-10-CM | POA: Diagnosis not present

## 2019-04-12 DIAGNOSIS — Z794 Long term (current) use of insulin: Secondary | ICD-10-CM | POA: Insufficient documentation

## 2019-04-12 DIAGNOSIS — N2581 Secondary hyperparathyroidism of renal origin: Secondary | ICD-10-CM | POA: Diagnosis not present

## 2019-04-12 DIAGNOSIS — R002 Palpitations: Secondary | ICD-10-CM | POA: Diagnosis not present

## 2019-04-12 DIAGNOSIS — E785 Hyperlipidemia, unspecified: Secondary | ICD-10-CM | POA: Diagnosis not present

## 2019-04-12 DIAGNOSIS — I959 Hypotension, unspecified: Secondary | ICD-10-CM | POA: Diagnosis not present

## 2019-04-12 DIAGNOSIS — I132 Hypertensive heart and chronic kidney disease with heart failure and with stage 5 chronic kidney disease, or end stage renal disease: Secondary | ICD-10-CM | POA: Insufficient documentation

## 2019-04-12 DIAGNOSIS — E8779 Other fluid overload: Secondary | ICD-10-CM | POA: Diagnosis not present

## 2019-04-12 DIAGNOSIS — E875 Hyperkalemia: Secondary | ICD-10-CM | POA: Diagnosis not present

## 2019-04-12 DIAGNOSIS — N186 End stage renal disease: Secondary | ICD-10-CM | POA: Diagnosis not present

## 2019-04-12 DIAGNOSIS — E1122 Type 2 diabetes mellitus with diabetic chronic kidney disease: Secondary | ICD-10-CM | POA: Insufficient documentation

## 2019-04-12 DIAGNOSIS — R0902 Hypoxemia: Secondary | ICD-10-CM | POA: Diagnosis not present

## 2019-04-12 LAB — CBC
HCT: 38.4 % — ABNORMAL LOW (ref 39.0–52.0)
Hemoglobin: 10.8 g/dL — ABNORMAL LOW (ref 13.0–17.0)
MCH: 25.5 pg — ABNORMAL LOW (ref 26.0–34.0)
MCHC: 28.1 g/dL — ABNORMAL LOW (ref 30.0–36.0)
MCV: 90.6 fL (ref 80.0–100.0)
Platelets: 329 10*3/uL (ref 150–400)
RBC: 4.24 MIL/uL (ref 4.22–5.81)
RDW: 17.1 % — ABNORMAL HIGH (ref 11.5–15.5)
WBC: 7.6 10*3/uL (ref 4.0–10.5)
nRBC: 0 % (ref 0.0–0.2)

## 2019-04-12 LAB — BASIC METABOLIC PANEL
Anion gap: 20 — ABNORMAL HIGH (ref 5–15)
BUN: 26 mg/dL — ABNORMAL HIGH (ref 6–20)
CO2: 23 mmol/L (ref 22–32)
Calcium: 9.3 mg/dL (ref 8.9–10.3)
Chloride: 96 mmol/L — ABNORMAL LOW (ref 98–111)
Creatinine, Ser: 9.21 mg/dL — ABNORMAL HIGH (ref 0.61–1.24)
GFR calc Af Amer: 7 mL/min — ABNORMAL LOW (ref >60)
GFR calc non Af Amer: 6 mL/min — ABNORMAL LOW (ref >60)
Glucose, Bld: 192 mg/dL — ABNORMAL HIGH (ref 70–99)
Potassium: 6.3 mmol/L (ref 3.5–5.1)
Sodium: 139 mmol/L (ref 135–145)

## 2019-04-12 LAB — PROTIME-INR
INR: 1 (ref 0.8–1.2)
Prothrombin Time: 13.2 seconds (ref 11.4–15.2)

## 2019-04-12 LAB — TROPONIN I (HIGH SENSITIVITY): Troponin I (High Sensitivity): 17 ng/L (ref <18)

## 2019-04-12 MED ORDER — INSULIN ASPART 100 UNIT/ML ~~LOC~~ SOLN
10.0000 [IU] | Freq: Once | SUBCUTANEOUS | Status: AC
  Administered 2019-04-13: 10 [IU] via INTRAVENOUS

## 2019-04-12 MED ORDER — SODIUM CHLORIDE 0.9% FLUSH: 3.0000 mL | Freq: Once | INTRAVENOUS | Status: DC

## 2019-04-12 MED ORDER — SODIUM ZIRCONIUM CYCLOSILICATE 10 G PO PACK
10.0000 g | PACK | Freq: Once | ORAL | Status: AC
  Administered 2019-04-13: 10 g via ORAL
  Filled 2019-04-12: qty 1

## 2019-04-12 MED ORDER — DEXTROSE 50 % IV SOLN
1.0000 | Freq: Once | INTRAVENOUS | Status: AC
  Administered 2019-04-13: 50 mL via INTRAVENOUS
  Filled 2019-04-12: qty 50

## 2019-04-12 NOTE — ED Provider Notes (Signed)
[facility] EMERGENCY DEPARTMENT Provider Note   CSN: [id number] Arrival date & time: 04/12/19  1944     History Chief Complaint  Patient presents with  . Palpitations / SVT    [name] [name] is a 54 y.o. male with past medical history significant for CHF, diabetes, ESRD on dialysis, cardiomyopathy presents to emergency department today via EMS with chief complaint of shortness of breath and palpitation. Onset just prior to arrival.  Patient dialyzes at [place] T/TH/Sat. He went to dialysis today and during the last 30 minutes he became short of breath. He stopped his session and went outside to get fresh air. He states his shortness of breath improved. He went home and continued on with his usual activities. He later was sitting on the couch and started to feel shortness of breath and palpitations. He called EMS and found to have heart rate in the 180-190s in SVT. He was given Adenosine 6 mg, 12 mg with improvement, heart rate 105s. He denies any associated chest pain, fever, chills, cough, abdominal pain, nausea, vomiting. Patient does not make urine.    Past Medical History:  Diagnosis Date  . Anal infection    posterior anal canal  . Anemia, chronic renal failure   . Atrophic kidney    BILATERAL  . DM type 2 causing ESRD [facility])    Nephrologist-- dr [name] [facility])--  on hemodialysis since June 2012 at  [name]  TTS  . Hemodialysis patient [facility])    at [place] on Tues/ Thur/Sat/schedule  . Hemorrhoids   . Hepatitis B antibody positive   . History of pleural effusion    bilateral  . Hyperparathyroidism, secondary renal ([place])   . Hypertension   . Ischemic cardiomyopathy    per echo 07-01-2014  ef 45%  . LAFB (left anterior fascicular block)   . Peripheral neuropathy   . Systolic and diastolic CHF, chronic ([place])    CARDIOLOGIST-  DR Daneen Schick ([place])  AND DR [name] ([name])      Patient Active Problem List   Diagnosis Date Noted  . 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 Acute GI bleeding 10/13/2017  . ESRD on hemodialysis ([place]) 10/13/2017  . Embolism, arterial ([place]) 10/12/2017  . Acute encephalopathy 03/13/2016  . Acute hyperkalemia 03/13/2016  . Diabetes mellitus type 2, uncontrolled ([place]) 05/03/2014  . Non-ischemic cardiomyopathy ([place]) 05/03/2014  . HYPERCHOLESTEROLEMIA 08/19/2009  . Essential hypertension 08/19/2009  . Chronic systolic heart failure ([place]) 08/19/2009  . CKD (chronic kidney disease) stage V requiring chronic dialysis ([place]) 08/19/2009  . Depression 08/19/2009    Past Surgical History:  Procedure Laterality Date  . APPENDECTOMY  09-12-2004   laparotomy w/ drainage peritinitis  . AV FISTULA PLACEMENT  02-27-2010   right forearm (RADIOCEPHALIC)  . AV FISTULA REPAIR  10-30-2010  . CARDIOVASCULAR STRESS TEST  10-29-2011   dr Daneen Schick   Low risk scan;  mild perfusion defect seen in the basal inferoseptal, basal inferior and mid inferior regions consistent with an infarct/scar and/or overlying attenuation/  mild to moderate global LVSF,  ef 40-45%  . DOBUTAMINE STRESS ECHO  07-23-2012   [name]   abnormal ;  at rest estimated lvef 25-30% and global severe LV hypokinesis ;  no cp during stress and achieved 85% maxium predicted heart rate;  negative stress ECG for inducible ischemia;  estimated lvef with stress 35-40%;  augmentation of  wall segments consistant with cardiomyopathy and differential fibrosis  . FISTULOTOMY N/A 03/26/2015   Procedure: FISTULOTOMY;  Surgeon: [name], MD;  Location: [facility];  Service: General;  Laterality: N/A;  . INCISION AND DRAINAGE ABSCESS N/A 03/26/2015   Procedure: ANAL INCISION AND DRAINAGE;  Surgeon: [name], MD;  Location: [facility];  Service: General;  Laterality: N/A;  . RETINAL DETACHMENT SURGERY Left 2011   incomplete repair/ needs eye  drops to keep pressure down  . TEE WITHOUT CARDIOVERSION N/A 10/17/2017   Procedure: TRANSESOPHAGEAL ECHOCARDIOGRAM (TEE);  Surgeon: Josue Hector, MD;  Location: [facility] ENDOSCOPY;  Service: Cardiovascular;  Laterality: N/A;  . TRANSTHORACIC ECHOCARDIOGRAM  07-01-2014    done at [facility]   grade 1 diastolic dysfunction,  ef 45%/  trace TR and PR       Family History  Family history unknown: Yes    Social History   Tobacco Use  . Smoking status: Never Smoker  . Smokeless tobacco: Never Used  Substance Use Topics  . Alcohol use: No  . Drug use: No    Home Medications Prior to Admission medications   Medication Sig Start Date End Date Taking? Authorizing Provider  acetaminophen (TYLENOL) 500 MG tablet Take 1,000 mg by mouth as needed.    [provider]  aspirin 325 MG tablet Take 325 mg by mouth daily.    [provider]  calcium acetate (PHOSLO) 667 MG capsule Take 2,001 mg by mouth 3 (three) times daily with meals.  06/02/10   [provider]  cinacalcet (SENSIPAR) 60 MG tablet Take 60 mg by mouth daily.    [provider]  cycloSPORINE (RESTASIS) 0.05 % ophthalmic emulsion Place 1 drop into both eyes 2 (two) times daily. 04/22/14   [provider]  febuxostat (ULORIC) 40 MG tablet Take 40 mg by mouth daily.     [provider]  ferric citrate (AURYXIA) 1 GM 210 MG(Fe) tablet Take 420 mg by mouth 3 (three) times daily.    [provider]  gabapentin (NEURONTIN) 300 MG capsule Take 300 mg by mouth 3 (three) times daily as needed. For pain 05/09/15   [provider]  HYDROcodone-acetaminophen (NORCO/VICODIN) 5-325 MG tablet Take 2 tablets by mouth every 6 (six) hours as needed for up to 10 doses. 12/18/18   Curatolo, Adam, DO  Insulin Aspart Prot & Aspart (NOVOLOG MIX 70/30 PENFILL [place]) Inject 14 Units into the skin 3 (three) times daily. Adjusts dose if CBG greater than 150    [provider]    LIPITOR 10 MG tablet Take 10 mg by mouth daily. 01/08/16   [provider]  midodrine (PROAMATINE) 5 MG tablet Take 5 mg by mouth Every Tuesday,Thursday,and Saturday with dialysis.  10/06/17   [provider]  Multiple Vitamins-Minerals (MEGA MULTIVITAMIN FOR MEN PO) Take 1 tablet by mouth daily.     [provider]  oxyCODONE-acetaminophen (PERCOCET) 10-325 MG tablet Take 1 tablet by mouth every 4 (four) hours as needed for pain.  07/04/18   [provider]  oxyCODONE-acetaminophen (PERCOCET/ROXICET) 5-325 MG tablet Take 1 tablet by mouth every 4 (four) hours as needed for severe pain. 11/26/18   Alroy Bailiff, Marg[place], PA-C  sevelamer carbonate (RENVELA) 800 MG tablet Take 2,400 mg by mouth 3 (three) times daily with meals.     [provider]    Allergies    Patient has no known allergies.  Review of Systems  Review of Systems All other systems are reviewed and are negative for acute change except as noted in the HPI.  Physical Exam Updated Vital Signs BP (!) 118/56 (BP Location: Left Arm)   Pulse (!) 110   Temp 98.1 F (36.7 C) (Oral)   Resp 20   SpO2 99%   Physical Exam Vitals and nursing note reviewed.  Constitutional:      General: He is not in acute distress.    Appearance: He is not ill-appearing.  HENT:     Head: Normocephalic and atraumatic.     Right Ear: Tympanic membrane and external ear normal.     Left Ear: Tympanic membrane and external ear normal.     Nose: Nose normal.     Mouth/Throat:     Mouth: Mucous membranes are moist.     Pharynx: Oropharynx is clear.  Eyes:     General: No scleral icterus.       Right eye: No discharge.        Left eye: No discharge.     Extraocular Movements: Extraocular movements intact.     Conjunctiva/sclera: Conjunctivae normal.     Pupils: Pupils are equal, round, and reactive to light.  Neck:     Vascular: No JVD.  Cardiovascular:     Rate and Rhythm: Regular rhythm. Tachycardia  present.     Pulses: Normal pulses.          Radial pulses are 2+ on the right side and 2+ on the left side.     Heart sounds: Normal heart sounds.  Pulmonary:     Comments: Lungs clear to auscultation in all fields. Symmetric chest rise. No wheezing, rales, or rhonchi. Abdominal:     Comments: Abdomen is soft, non-distended, and non-tender in all quadrants. No rigidity, no guarding. No peritoneal signs.  Musculoskeletal:        General: Normal range of motion.     Cervical back: Normal range of motion.     Right lower leg: No edema.     Left lower leg: No edema.  Skin:    General: Skin is warm and dry.     Capillary Refill: Capillary refill takes less than 2 seconds.     Comments: AV fistula present in the right forearm with good thrill   Neurological:     Mental Status: He is oriented to person, place, and time.     GCS: GCS eye subscore is 4. GCS verbal subscore is 5. GCS motor subscore is 6.     Comments: Fluent speech, no facial droop.  Psychiatric:        Behavior: Behavior normal.       ED Results / Procedures / Treatments   Labs (all labs ordered are listed, but only abnormal results are displayed) Labs Reviewed  BASIC METABOLIC PANEL - Abnormal; Notable for the following components:      Result Value   Potassium 6.3 (*)    Chloride 96 (*)    Glucose, Bld 192 (*)    BUN 26 (*)    Creatinine, Ser 9.21 (*)    GFR calc non Af Amer 6 (*)    GFR calc Af Amer 7 (*)    Anion gap 20 (*)    All other components within normal limits  CBC - Abnormal; Notable for the following components:   Hemoglobin 10.8 (*)    HCT 38.4 (*)    MCH 25.5 (*)    MCHC 28.1 (*)  RDW 17.1 (*)    All other components within normal limits  CBG MONITORING, ED - Abnormal; Notable for the following components:   Glucose-Capillary 193 (*)    All other components within normal limits  PROTIME-INR  TROPONIN I (HIGH SENSITIVITY)    EKG EKG Interpretation  Date/Time:  Thursday April 12 2019 20:23:09 EST Ventricular Rate:  105 PR Interval:  144 QRS Duration: 84 QT Interval:  370 QTC Calculation: 489 R Axis:   101 Text Interpretation: Sinus tachycardia Rightward axis Borderline ECG Confirmed by Veryl Speak [phone]) on 04/12/2019 11:29:02 PM   Radiology DG Chest 2 View  Result Date: 04/12/2019 CLINICAL DATA:  Short of breath and palpitations after dialysis for 1 day. History of hypertension and ischemic cardiomyopathy. EXAM: CHEST - 2 VIEW COMPARISON:  Chest CTA, 11/26/2018. FINDINGS: Cardiac silhouette is mildly enlarged, stable. No mediastinal or hilar masses. No evidence of adenopathy. Prominent bronchovascular markings. Lungs otherwise clear. No pleural effusion or pneumothorax. Skeletal structures are intact. IMPRESSION: 1. No acute cardiopulmonary disease. 2. Stable mild cardiomegaly. Electronically Signed   By: Lajean Manes M.D.   On: 04/12/2019 20:51    Procedures Procedures (including critical care time)  Medications Ordered in ED Medications  sodium chloride flush (NS) 0.9 % injection 3 mL (has no administration in time range)  sodium zirconium cyclosilicate (LOKELMA) packet 10 g (10 g Oral Given 04/13/19 0019)  dextrose 50 % solution 50 mL (50 mLs Intravenous Given 04/13/19 0021)  insulin aspart (novoLOG) injection 10 Units (10 Units Intravenous Given 04/13/19 0024)    ED Course  I have reviewed the triage vital signs and the nursing notes.  Pertinent labs & imaging results that were available during my care of the patient were reviewed by me and considered in my medical decision making (see chart for details). Vitals:   04/12/19 2014 04/12/19 2300  BP: (!) 118/56   Pulse: (!) 110 (!) 108  Resp: 20 16  Temp: 98.1 F (36.7 C)   TempSrc: Oral   SpO2: 99% 97%       MDM Rules/Calculators/A&P                      Patient seen and examined. Patient presents awake, alert, afebrile, non toxic.  In triage patient noted to have heart rate of 110.  Blood  pressure 118/56.  No hypoxia.  EKG shows sinus tachycardia.  Labs collected in triage.  I viewed these results.  CBC without leukocytosis, hemoglobin of 10.8 appears consistent with his baseline.  BMP shows hyperkalemia with potassium of 6.3.  He has leukosis 192, BUN/creatinine 26/9.21, this is consistent with his baseline as well.  He does have an elevated anion gap of 20.  Troponin is 17. PT/INR within normal range. I viewed pt's chest xray and it does not suggest acute infectious processes, does show stable cardiomegaly.    During my exam patient's heart rate ranged from 110 to 115. He has normal work of breathing and lungs are clear to auscultation. He has no chest pain or palpitations. No lower extremity edema.   Case discussed with on call nephrologist Dr. Justin Mend who recommends inpatient Lokelma, insulin and glucose. Patient will need to call his dialysis center tomorrow to have extra treatment.  During my reassessment patient is requesting to be discharged home. His heart rate is 108 during exam which he states is his baseline. He has normal work of breathing, no chest pain or palpitations. Checked glucose after he  received medications and it is 193. Patient agrees with plan to call dialysis center tomorrow to schedule an extra session.  If he is unable to have a session tomorrow or if symptoms worsen he will need to return to the emergency department for further evaluation and repeat labs to check potassium. Patient agrees with plan of care.  The patient appears reasonably screened and/or stabilized for discharge and I doubt any other medical condition or other [facility] requiring further screening, evaluation, or treatment in the ED at this time prior to discharge. The patient is safe for discharge with strict return precautions discussed. Recommend pcp follow up if symptoms persist.   Findings and plan of care discussed with supervising physician Dr. Stark Jock who agrees with plan of care.    Portions of  this note were generated with [employer]. Dictation errors may occur despite best attempts at proofreading.   Final Clinical Impression(s) / ED Diagnoses Final diagnoses:  Hyperkalemia    Rx / DC Orders ED Discharge Orders    None       Cherre Robins, PA-C 04/13/19 [id number]    Veryl Speak, MD 04/13/19 [phone]

## 2019-04-12 NOTE — ED Triage Notes (Signed)
Patient arrived with EMS from home reports palpitations this evening with SOB , he received Adenosine 6 mg/12 mg IV by EMS for SVT= 180-190/min , denies chest pain , HD q Tues/Thurs/Sat . HR=105 at triage.

## 2019-04-13 DIAGNOSIS — M2550 Pain in unspecified joint: Secondary | ICD-10-CM | POA: Diagnosis not present

## 2019-04-13 DIAGNOSIS — Z79891 Long term (current) use of opiate analgesic: Secondary | ICD-10-CM | POA: Diagnosis not present

## 2019-04-13 DIAGNOSIS — E875 Hyperkalemia: Secondary | ICD-10-CM | POA: Diagnosis not present

## 2019-04-13 DIAGNOSIS — Z79899 Other long term (current) drug therapy: Secondary | ICD-10-CM | POA: Diagnosis not present

## 2019-04-13 DIAGNOSIS — M792 Neuralgia and neuritis, unspecified: Secondary | ICD-10-CM | POA: Diagnosis not present

## 2019-04-13 DIAGNOSIS — S68119A Complete traumatic metacarpophalangeal amputation of unspecified finger, initial encounter: Secondary | ICD-10-CM | POA: Diagnosis not present

## 2019-04-13 DIAGNOSIS — M791 Myalgia, unspecified site: Secondary | ICD-10-CM | POA: Diagnosis not present

## 2019-04-13 LAB — CBG MONITORING, ED: Glucose-Capillary: 193 mg/dL — ABNORMAL HIGH (ref 70–99)

## 2019-04-13 NOTE — Discharge Instructions (Addendum)
You have been seen today for fast heart rate. Please read and follow all provided instructions. Return to the emergency room for worsening condition or new concerning symptoms.    Your potassium was elevated today to 6.3. Please call your dialysis center first thing tomorrow to have an extra dialysis session. It is very important you have this session to help lower your potassium.   If you are unable to have dialysis session tomorrow you should return immediately to the emergency department for further evaluation and lab work.  1. Medications: Continue usual home medications Take medications as prescribed. Please review all of the medicines and only take them if you do not have an allergy to them.   2. Treatment: rest, drink plenty of fluids  3. Follow Up:  Please follow up with primary care provider by scheduling an appointment as soon as possible for a visit     ?

## 2019-04-14 DIAGNOSIS — E785 Hyperlipidemia, unspecified: Secondary | ICD-10-CM | POA: Diagnosis not present

## 2019-04-14 DIAGNOSIS — N186 End stage renal disease: Secondary | ICD-10-CM | POA: Diagnosis not present

## 2019-04-14 DIAGNOSIS — D631 Anemia in chronic kidney disease: Secondary | ICD-10-CM | POA: Diagnosis not present

## 2019-04-14 DIAGNOSIS — E8779 Other fluid overload: Secondary | ICD-10-CM | POA: Diagnosis not present

## 2019-04-14 DIAGNOSIS — N2581 Secondary hyperparathyroidism of renal origin: Secondary | ICD-10-CM | POA: Diagnosis not present

## 2019-04-16 DIAGNOSIS — R21 Rash and other nonspecific skin eruption: Secondary | ICD-10-CM | POA: Diagnosis not present

## 2019-04-17 DIAGNOSIS — N186 End stage renal disease: Secondary | ICD-10-CM | POA: Diagnosis not present

## 2019-04-17 DIAGNOSIS — N2581 Secondary hyperparathyroidism of renal origin: Secondary | ICD-10-CM | POA: Diagnosis not present

## 2019-04-17 DIAGNOSIS — E8779 Other fluid overload: Secondary | ICD-10-CM | POA: Diagnosis not present

## 2019-04-17 DIAGNOSIS — D631 Anemia in chronic kidney disease: Secondary | ICD-10-CM | POA: Diagnosis not present

## 2019-04-17 DIAGNOSIS — E785 Hyperlipidemia, unspecified: Secondary | ICD-10-CM | POA: Diagnosis not present

## 2019-04-19 DIAGNOSIS — N186 End stage renal disease: Secondary | ICD-10-CM | POA: Diagnosis not present

## 2019-04-19 DIAGNOSIS — Z114 Encounter for screening for human immunodeficiency virus [HIV]: Secondary | ICD-10-CM | POA: Diagnosis not present

## 2019-04-19 DIAGNOSIS — E8779 Other fluid overload: Secondary | ICD-10-CM | POA: Diagnosis not present

## 2019-04-19 DIAGNOSIS — Z1159 Encounter for screening for other viral diseases: Secondary | ICD-10-CM | POA: Diagnosis not present

## 2019-04-19 DIAGNOSIS — E785 Hyperlipidemia, unspecified: Secondary | ICD-10-CM | POA: Diagnosis not present

## 2019-04-19 DIAGNOSIS — N2581 Secondary hyperparathyroidism of renal origin: Secondary | ICD-10-CM | POA: Diagnosis not present

## 2019-04-19 DIAGNOSIS — D631 Anemia in chronic kidney disease: Secondary | ICD-10-CM | POA: Diagnosis not present

## 2019-04-20 ENCOUNTER — Ambulatory Visit (INDEPENDENT_AMBULATORY_CARE_PROVIDER_SITE_OTHER): Payer: Medicare Other | Admitting: Cardiology

## 2019-04-20 ENCOUNTER — Encounter: Payer: Self-pay | Admitting: Cardiology

## 2019-04-20 ENCOUNTER — Other Ambulatory Visit: Payer: Self-pay

## 2019-04-20 VITALS — BP 110/64 | HR 105 | Ht 65.0 in | Wt 244.1 lb

## 2019-04-20 DIAGNOSIS — I953 Hypotension of hemodialysis: Secondary | ICD-10-CM

## 2019-04-20 DIAGNOSIS — R600 Localized edema: Secondary | ICD-10-CM

## 2019-04-20 DIAGNOSIS — E7800 Pure hypercholesterolemia, unspecified: Secondary | ICD-10-CM | POA: Diagnosis not present

## 2019-04-20 DIAGNOSIS — I5022 Chronic systolic (congestive) heart failure: Secondary | ICD-10-CM | POA: Diagnosis not present

## 2019-04-20 DIAGNOSIS — E1165 Type 2 diabetes mellitus with hyperglycemia: Secondary | ICD-10-CM | POA: Diagnosis not present

## 2019-04-20 DIAGNOSIS — I471 Supraventricular tachycardia, unspecified: Secondary | ICD-10-CM

## 2019-04-20 DIAGNOSIS — Z992 Dependence on renal dialysis: Secondary | ICD-10-CM | POA: Diagnosis not present

## 2019-04-20 DIAGNOSIS — N2581 Secondary hyperparathyroidism of renal origin: Secondary | ICD-10-CM | POA: Diagnosis not present

## 2019-04-20 DIAGNOSIS — E8779 Other fluid overload: Secondary | ICD-10-CM | POA: Diagnosis not present

## 2019-04-20 DIAGNOSIS — E785 Hyperlipidemia, unspecified: Secondary | ICD-10-CM | POA: Diagnosis not present

## 2019-04-20 DIAGNOSIS — D631 Anemia in chronic kidney disease: Secondary | ICD-10-CM | POA: Diagnosis not present

## 2019-04-20 DIAGNOSIS — N186 End stage renal disease: Secondary | ICD-10-CM | POA: Diagnosis not present

## 2019-04-20 NOTE — Patient Instructions (Addendum)
Medication Instructions:  Your physician recommends that you continue on your current medications as directed. Please refer to the Current Medication list given to you today.  *If you need a refill on your cardiac medications before your next appointment, please call your pharmacy*  Lab Work: None ordered  If you have labs (blood work) drawn today and your tests are completely normal, you will receive your results only by: [name] MyChart Message (if you have MyChart) OR . A paper copy in the mail If you have any lab test that is abnormal or we need to change your treatment, we will call you to review the results.  Testing/Procedures: Your physician has requested that you have an echocardiogram. Echocardiography is a painless test that uses sound waves to create images of your heart. It provides your doctor with information about the size and shape of your heart and how well your heart's chambers and valves are working. This procedure takes approximately one hour. There are no restrictions for this procedure.    Follow-Up: At [facility], you and your health needs are our priority.  As part of our continuing mission to provide you with exceptional heart care, we have created designated Provider Care Teams.  These Care Teams include your primary Cardiologist (physician) and Advanced Practice Providers (APPs -  Physician Assistants and Nurse Practitioners) who all work together to provide you with the care you need, when you need it.  Your next appointment:   05/01/2019  The format for your next appointment:   In Person  Provider:   Truitt Merle, NP  Other Instructions  Echocardiogram An echocardiogram is a procedure that uses painless sound waves (ultrasound) to produce an image of the heart. Images from an echocardiogram can provide important information about:  Signs of coronary artery disease (CAD).  Aneurysm detection. An aneurysm is a weak or damaged part of an artery wall that  bulges out from the normal force of blood pumping through the body.  Heart size and shape. Changes in the size or shape of the heart can be associated with certain conditions, including heart failure, aneurysm, and CAD.  Heart muscle function.  Heart valve function.  Signs of a past heart attack.  Fluid buildup around the heart.  Thickening of the heart muscle.  A tumor or infectious growth around the heart valves. Tell a health care provider about:  Any allergies you have.  All medicines you are taking, including vitamins, herbs, eye drops, creams, and over-the-counter medicines.  Any blood disorders you have.  Any surgeries you have had.  Any medical conditions you have.  Whether you are pregnant or may be pregnant. What are the risks? Generally, this is a safe procedure. However, problems may occur, including:  Allergic reaction to dye (contrast) that may be used during the procedure. What happens before the procedure? No specific preparation is needed. You may eat and drink normally. What happens during the procedure?   An IV tube may be inserted into one of your veins.  You may receive contrast through this tube. A contrast is an injection that improves the quality of the pictures from your heart.  A gel will be applied to your chest.  A wand-like tool (transducer) will be moved over your chest. The gel will help to transmit the sound waves from the transducer.  The sound waves will harmlessly bounce off of your heart to allow the heart images to be captured in real-time motion. The images will be recorded on  on a computer. The procedure may vary among health care providers and hospitals. What happens after the procedure?  You may return to your normal, everyday life, including diet, activities, and medicines, unless your health care provider tells you not to do that. Summary  An echocardiogram is a procedure that uses painless sound waves (ultrasound) to produce  an image of the heart.  Images from an echocardiogram can provide important information about the size and shape of your heart, heart muscle function, heart valve function, and fluid buildup around your heart.  You do not need to do anything to prepare before this procedure. You may eat and drink normally.  After the echocardiogram is completed, you may return to your normal, everyday life, unless your health care provider tells you not to do that. This information is not intended to replace advice given to you by your health care provider. Make sure you discuss any questions you have with your health care provider. Document Revised: 06/29/2018 Document Reviewed: 04/10/2016 Elsevier Patient Education  2020 [employer]   

## 2019-04-20 NOTE — Progress Notes (Signed)
Cardiology Office Note   Date:  04/20/2019   ID:  [name] [name], DOB May 07, 1965, MRN [id number]  PCP:  [name], MD  Cardiologist:  Dr Tamala Julian     Chief Complaint  Patient presents with  . Tachycardia  . Leg Swelling      History of Present Illness: [name] [name] is a 54 y.o. male who presents for post ER visits   hx of non-ischemic cardiomyopathy, ESRD on HD T/TH/Sat., DM II, and obesity. He has 2 sets of specialist. He has a nephrologist, Dr. Brayton El in [facility]. He is also is being seen by nephrology at [facility] and is on the list for transplantation.[name] He also sees a cardiologist at [facility], Dr. [name]. 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  He is now being seen predominantly at [facility] and being managed by Dr. Earmon Phoenix is had significant difficulty since his last office visit.  Since the last face-to-face visit he has had amputation of 2 digits on the left hand and one on the right hand.  He is unable to voice a consolidating diagnosis.  His understanding is that digital ischemia has been caused by low blood pressure that is idiopathic.  He is having difficulty with healing in the left hand.   Last seen by telemedicine 07/12/18 with Dr. Tamala Julian and was stable.   Pt in ER 04/12/19 with SVT, EMS gave 6mg  then 12 mg IV for SVT 180-190.  His K+ was 6.3 at that time.    Today he complains of increase of lower ext edema.  Started about 2 weeks ago, His legs are now uncomfortable - with the discomfort he cannot be dialyzed as long which adds to the problem, to see vein specialist next week.  He wears support stockings which help.  No chest pain some SOB.  If he goes to sleep with dialysis he can complete session.   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   He has had finger tips amputated currently on Lt side, prior on rt.  Due to Raynaud's syndrome.     He had TEE in 09/2017  with EF 60-65%, no RWMA no thrombus no endocarditis, previous echo with EF 40-45% and G1DD RV was mildly dilated.    Pt hypotensive with dialysis and take midodrine 5 mg prior to dialylss. And today with volume overload and no HD BP 110/64      to dialysis.      Past Medical History:  Diagnosis Date  . Anal infection    posterior anal canal  . Anemia, chronic renal failure   . Atrophic kidney    BILATERAL  . DM type 2 causing ESRD [facility])    Nephrologist-- dr [name] [facility])--  on hemodialysis since June 2012 at  [name]  TTS  . Hemodialysis patient [facility])    at [place] on Tues/ Thur/Sat/schedule  . Hemorrhoids   . Hepatitis B antibody positive   . History of pleural effusion    bilateral  . Hyperparathyroidism, secondary renal ([place])   . Hypertension   . Ischemic cardiomyopathy    per echo 07-01-2014  ef 45%  . LAFB (left anterior fascicular block)   . Peripheral neuropathy   . Systolic and diastolic CHF, chronic ([place])    CARDIOLOGIST-  DR Daneen Schick ([place])  AND DR [name] ([name])    Past Surgical  History:  Procedure Laterality Date  . APPENDECTOMY  09-12-2004   laparotomy w/ drainage peritinitis  . AV FISTULA PLACEMENT  02-27-2010   right forearm (RADIOCEPHALIC)  . AV FISTULA REPAIR  10-30-2010  . CARDIOVASCULAR STRESS TEST  10-29-2011   dr Daneen Schick   Low risk scan;  mild perfusion defect seen in the basal inferoseptal, basal inferior and mid inferior regions consistent with an infarct/scar and/or overlying attenuation/  mild to moderate global LVSF,  ef 40-45%  . DOBUTAMINE STRESS ECHO  07-23-2012   [name]   abnormal ;  at rest estimated lvef 25-30% and global severe LV hypokinesis ;  no cp during stress and achieved 85% maxium predicted heart rate;  negative stress ECG for inducible ischemia;  estimated lvef with stress 35-40%;  augmentation of wall segments consistant with cardiomyopathy and  differential fibrosis  . FISTULOTOMY N/A 03/26/2015   Procedure: FISTULOTOMY;  Surgeon: [name], MD;  Location: [facility];  Service: General;  Laterality: N/A;  . INCISION AND DRAINAGE ABSCESS N/A 03/26/2015   Procedure: ANAL INCISION AND DRAINAGE;  Surgeon: [name], MD;  Location: [place];  Service: General;  Laterality: N/A;  . RETINAL DETACHMENT SURGERY Left 2011   incomplete repair/ needs eye drops to keep pressure down  . TEE WITHOUT CARDIOVERSION N/A 10/17/2017   Procedure: TRANSESOPHAGEAL ECHOCARDIOGRAM (TEE);  Surgeon: Josue Hector, MD;  Location: [facility] ENDOSCOPY;  Service: Cardiovascular;  Laterality: N/A;  . TRANSTHORACIC ECHOCARDIOGRAM  07-01-2014    done at [facility]   grade 1 diastolic dysfunction,  ef 45%/  trace TR and PR     Current Outpatient Medications  Medication Sig Dispense Refill  . acetaminophen (TYLENOL) 500 MG tablet Take 1,000 mg by mouth as needed.    [name] aspirin 325 MG tablet Take 325 mg by mouth daily.    . calcium acetate (PHOSLO) 667 MG capsule Take 2,001 mg by mouth 3 (three) times daily with meals.     . cyanocobalamin 1000 MCG tablet Take by mouth.    . cycloSPORINE (RESTASIS) 0.05 % ophthalmic emulsion Place 1 drop into both eyes 2 (two) times daily.    . febuxostat (ULORIC) 40 MG tablet Take 40 mg by mouth daily.     . ferric citrate (AURYXIA) 1 GM 210 MG(Fe) tablet Take 420 mg by mouth 3 (three) times daily.    [name] gabapentin (NEURONTIN) 300 MG capsule Take 300 mg by mouth 3 (three) times daily as needed. For pain  3  . Insulin Aspart Prot & Aspart (NOVOLOG MIX 70/30 PENFILL ) Inject 14 Units into the skin 3 (three) times daily. Adjusts dose if CBG greater than 150    . midodrine (PROAMATINE) 5 MG tablet Take 5 mg by mouth 3 (three) times a week. Every Tuesday Thursday and Saturday    . Multiple Vitamins-Minerals (MEGA MULTIVITAMIN FOR MEN PO) Take 1 tablet by mouth daily.     [name]  oxyCODONE-acetaminophen (PERCOCET) 10-325 MG tablet Take 1 tablet by mouth every 4 (four) hours as needed for pain.      No current facility-administered medications for this visit.    Allergies:   Patient has no known allergies.    Social History:  The patient  reports that he has never smoked. He has never used smokeless tobacco. He reports that he does not drink alcohol or use drugs.   Family History:  The patient's Family history is unknown by patient.    ROS:  General:no colds or fevers, + weight increase Skin:no rashes or ulcers- scratches on leg with band aids pt scratched. HEENT:no blurred vision, no congestion CV:see HPI PUL:see HPI GI:no diarrhea constipation or melena, no indigestion [id number] not void MS:no joint pain, no claudication Neuro:no syncope, no lightheadedness Endo:+ diabetes, no thyroid disease  Wt Readings from Last 3 Encounters:  04/20/19 244 lb 1.9 oz (110.7 kg)  11/26/18 233 lb (105.7 kg)  07/12/18 238 lb (108 kg)     PHYSICAL EXAM: VS:  BP 110/64   Pulse (!) 105   Ht 5\' 5"  (1.651 m)   Wt 244 lb 1.9 oz (110.7 kg)   SpO2 97%   BMI 40.62 kg/m  , BMI Body mass index is 40.62 kg/m. General:Pleasant affect, NAD Skin:Warm and dry, brisk capillary refill with amputations of finger tips on both hands, dressing on Lt fingers HEENT:normocephalic, sclera clear, mucus membranes moist Neck:supple, no JVD, no bruits  Heart:S1S2 RRR without murmur, gallup, rub or click Lungs:clear without rales, rhonchi, or wheezes [id number], non tender, + BS, do not palpate liver spleen or masses Ext:3-4+ lower ext edema on lt to mid thigh more posterior in thighs and Rt to just above knee unable to palpate  pedal pulses, 1+ radial pulses Neuro:alert and oriented X 3, MAE, follows commands, + facial symmetry    EKG:  EKG is ordered today. The ekg ordered today demonstrates ST at 105 rightward axis but no changes from 04/13/19 or 11/26/18.    Recent Labs:  04/12/2019: BUN 26; Creatinine, Ser 9.21; Hemoglobin 10.8; Platelets 329; Potassium 6.3; Sodium 139    Lipid Panel No results found for: CHOL, TRIG, HDL, CHOLHDL, VLDL, LDLCALC, LDLDIRECT     Other studies Reviewed: Additional studies/ records that were reviewed today include: . TEE 10/17/17  Study Conclusions   - Left ventricle: Systolic function was normal. The estimated  ejection fraction was in the range of 60% to 65%. Wall motion was  normal; there were no regional wall motion abnormalities.  - Left atrium: No evidence of thrombus in the atrial cavity or  appendage. No evidence of thrombus in the atrial cavity or  appendage. No evidence of thrombus in the appendage.  - Right atrium: No evidence of thrombus in the atrial cavity or  appendage. No evidence of thrombus in the atrial cavity or  appendage.  - Atrial septum: No defect or patent foramen ovale was identified.  Echo contrast study showed no right-to-left atrial level shunt,  at baseline or with provocation.  - Impressions: No evidence of endocarditis. No cardiac source of  emboli was indentified.   Impressions:   - No evidence of endocarditis. No cardiac source of emboli was  indentified.   Review of EMS strips ER note  Prior office notes  PV angio done at [facility] 11/11/17 Findings:  1. Aortic arch with normal configuration and no significant atherosclerosis of the supra-aortic trunks 2. Right subclavian, axillary, and brachial arteries with no severe stenosis 3. Patent right radio-cephalic AVF 4. Radial artery occlusion distally 5. Right ulnar artery filling the palmar arch which is intact and filling all five digits 6. Left subclavian, axillary, brachial arteries widely patent and no stenosis 7. Left radial patent to the wrist and fills the hand 8. Left Ulnar diminutive and occludes above the wrist level   ASSESSMENT AND PLAN:  1.  SVT at 190 to 180 treaded with adenosine.  No  recurrence, pt's k+ was 6.3 at the time.hs troponin  Was neg at 63.  -  may have electrolyte abnormality - difficulty adding medication to prevent with hypotension. If recurrent possible SVT ablation.    2.  Lower ext edema not being able to complete dialysis is causing an increase. But with pain from edema it is difficult to complete. Will check echo for EF and no valvular issues.   May be due to silent ischemia with diabetic on dialysis no DOE either - if drop in EF may warrant cardiac cath.   Follow up after echo- continue support stockings and see vein specialist.   3.  Raynaud's syndrome with digit amputations. Followed with Ortho  4.  Hypotension, on midodrine on HD days and today with increased edema BP only [id number] systolic   5.   Chronic systolic HF, lungs are clear.  6.  DM-2 has been poorly controlled.  7.  ESRD on HD T/TH/S - failed kidney transplant   8. Hx of HLD off statin to be addressed at next visit depending on echo but with PAD statin would be helpful.              Current medicines are reviewed with the patient today.  The patient Has no concerns regarding medicines.  The following changes have been made:  See above Labs/ tests ordered today include:see above  Disposition:   FU:  see above  Signed, Cecilie Kicks, NP  04/20/2019 4:48 PM    [facility] [place], [place], [place], [place] Phone: [phone]; Fax: [phone]

## 2019-04-21 DIAGNOSIS — N186 End stage renal disease: Secondary | ICD-10-CM | POA: Diagnosis not present

## 2019-04-21 DIAGNOSIS — N2581 Secondary hyperparathyroidism of renal origin: Secondary | ICD-10-CM | POA: Diagnosis not present

## 2019-04-21 DIAGNOSIS — D631 Anemia in chronic kidney disease: Secondary | ICD-10-CM | POA: Diagnosis not present

## 2019-04-21 DIAGNOSIS — E785 Hyperlipidemia, unspecified: Secondary | ICD-10-CM | POA: Diagnosis not present

## 2019-04-21 DIAGNOSIS — E8779 Other fluid overload: Secondary | ICD-10-CM | POA: Diagnosis not present

## 2019-04-22 DIAGNOSIS — Z992 Dependence on renal dialysis: Secondary | ICD-10-CM | POA: Diagnosis not present

## 2019-04-22 DIAGNOSIS — N186 End stage renal disease: Secondary | ICD-10-CM | POA: Diagnosis not present

## 2019-04-23 DIAGNOSIS — R21 Rash and other nonspecific skin eruption: Secondary | ICD-10-CM | POA: Diagnosis not present

## 2019-04-24 ENCOUNTER — Ambulatory Visit (HOSPITAL_COMMUNITY): Payer: Medicare Other | Attending: Internal Medicine

## 2019-04-24 ENCOUNTER — Other Ambulatory Visit: Payer: Self-pay

## 2019-04-24 DIAGNOSIS — D631 Anemia in chronic kidney disease: Secondary | ICD-10-CM | POA: Diagnosis not present

## 2019-04-24 DIAGNOSIS — N2581 Secondary hyperparathyroidism of renal origin: Secondary | ICD-10-CM | POA: Diagnosis not present

## 2019-04-24 DIAGNOSIS — R600 Localized edema: Secondary | ICD-10-CM | POA: Diagnosis not present

## 2019-04-24 DIAGNOSIS — D509 Iron deficiency anemia, unspecified: Secondary | ICD-10-CM | POA: Diagnosis not present

## 2019-04-24 DIAGNOSIS — N186 End stage renal disease: Secondary | ICD-10-CM | POA: Diagnosis not present

## 2019-04-25 NOTE — Progress Notes (Signed)
CARDIOLOGY OFFICE NOTE  Date:  04/30/2019    [name] Date of Birth: 24-Feb-1966 Medical Record [id number]  PCP:  [name], MD  Cardiologist:  Tamala Julian    Chief Complaint  Patient presents with  . Follow-up    Seen for Dr. Tamala Julian    History of Present Illness: [name] [name] is a 54 y.o. male who presents today for a follow up visit. Seen for Dr. Tamala Julian.   He has a history of NICM, ESRD - on HD T/TH/Sat, DM2, and obesity. He is followed by Dr. Brayton El - neprologist in [place] and also sees nephrology at [facility] - on list for transplant. Sees Dr. Quentin Cornwall - cardiology at [facility] as well. Also followed by Dr. Melburn Hake at [facility]. He has had prior renal transplant with early failure (only had for a few hours). Dialysis is complicated by hypotension - on Midodrine.   Last seen by Dr. Tamala Julian back in April by telehealth visit. 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 No consolidating diagnosis (?Raynaud's) - noted that his understanding was that digital ischemia had been caused by low BP that is idiopathic. He has had difficulty with healing.   He was in the ER in January with SVT - treated with Adenosine by EMS. Potassium was 6.3.   He saw Cecilie Kicks, NP for a follow up afterwards - complaining of lower extremity edema - making legs uncomfortable/painful - which has impacted his sessions of dialysis. He was to be seeing vein specialist. Mickel Baas updated his echo - EF back to 40 to 45% - she had mentioned need for cardiac cath.   The patient does not have symptoms concerning for COVID-19 infection (fever, chills, cough, or new shortness of breath).   Comes in today. Here alone. He notes that his swelling persists. His breathing is actually improved. His fingers have healed but he is keeping dressings on the left hand "just for protection" - all still quite tender. Apparently, per his report - this is from  Raynaud's. No actual chest pain. His activity is limited as a general rule - he tires easily. Dialysis is difficult - not always able to have the entire treatment session. His weight is basically unchanged. He is not on any cardiac medicines. He has had arterial dopplers which were ok and to have venous dopplers coming up in just a few weeks. Per his report, he is not transplant eligible due to prior transplant failure. His mother and brother help him. BP typically soft. His balance is not good - no recent falls.   Past Medical History:  Diagnosis Date  . Anal infection    posterior anal canal  . Anemia, chronic renal failure   . Atrophic kidney    BILATERAL  . DM type 2 causing ESRD [facility])    Nephrologist-- dr [name] [facility])--  on hemodialysis since June 2012 at  [name]  TTS  . Hemodialysis patient [facility])    at [place] on Tues/ Thur/Sat/schedule  . Hemorrhoids   . Hepatitis B antibody positive   . History of pleural effusion    bilateral  . Hyperparathyroidism, secondary renal ([place])   . Hypertension   . Ischemic cardiomyopathy    per echo 07-01-2014  ef 45%  . LAFB (left anterior fascicular block)   . Peripheral neuropathy   . Systolic and diastolic CHF, chronic ([place])  CARDIOLOGIST-  DR Daneen Schick ([place])  AND DR [name] ([name])    Past Surgical History:  Procedure Laterality Date  . APPENDECTOMY  09-12-2004   laparotomy w/ drainage peritinitis  . AV FISTULA PLACEMENT  02-27-2010   right forearm (RADIOCEPHALIC)  . AV FISTULA REPAIR  10-30-2010  . CARDIOVASCULAR STRESS TEST  10-29-2011   dr Daneen Schick   Low risk scan;  mild perfusion defect seen in the basal inferoseptal, basal inferior and mid inferior regions consistent with an infarct/scar and/or overlying attenuation/  mild to moderate global LVSF,  ef 40-45%  . DOBUTAMINE STRESS ECHO  07-23-2012   [name]   abnormal ;  at rest estimated lvef 25-30%  and global severe LV hypokinesis ;  no cp during stress and achieved 85% maxium predicted heart rate;  negative stress ECG for inducible ischemia;  estimated lvef with stress 35-40%;  augmentation of wall segments consistant with cardiomyopathy and differential fibrosis  . FISTULOTOMY N/A 03/26/2015   Procedure: FISTULOTOMY;  Surgeon: [name], MD;  Location: [facility];  Service: General;  Laterality: N/A;  . INCISION AND DRAINAGE ABSCESS N/A 03/26/2015   Procedure: ANAL INCISION AND DRAINAGE;  Surgeon: [name], MD;  Location: [place];  Service: General;  Laterality: N/A;  . RETINAL DETACHMENT SURGERY Left 2011   incomplete repair/ needs eye drops to keep pressure down  . TEE WITHOUT CARDIOVERSION N/A 10/17/2017   Procedure: TRANSESOPHAGEAL ECHOCARDIOGRAM (TEE);  Surgeon: Josue Hector, MD;  Location: [facility] ENDOSCOPY;  Service: Cardiovascular;  Laterality: N/A;  . TRANSTHORACIC ECHOCARDIOGRAM  07-01-2014    done at [facility]   grade 1 diastolic dysfunction,  ef 45%/  trace TR and PR     Medications: Current Meds  Medication Sig  . acetaminophen (TYLENOL) 500 MG tablet Take 1,000 mg by mouth as needed.  [name] aspirin 325 MG tablet Take 325 mg by mouth daily.  . calcium acetate (PHOSLO) 667 MG capsule Take 2,001 mg by mouth 3 (three) times daily with meals.   . celecoxib (CELEBREX) 100 MG capsule Take 100 mg by mouth daily.  . cyanocobalamin 1000 MCG tablet Take by mouth.  . cycloSPORINE (RESTASIS) 0.05 % ophthalmic emulsion Place 1 drop into both eyes 2 (two) times daily.  . febuxostat (ULORIC) 40 MG tablet Take 40 mg by mouth daily.   . ferric citrate (AURYXIA) 1 GM 210 MG(Fe) tablet Take 420 mg by mouth 3 (three) times daily.  [name] gabapentin (NEURONTIN) 300 MG capsule Take 300 mg by mouth 3 (three) times daily as needed. For pain  . midodrine (PROAMATINE) 5 MG tablet Take 5 mg by mouth 3 (three) times a week. Every Tuesday Thursday and  Saturday  . Multiple Vitamins-Minerals (MEGA MULTIVITAMIN FOR MEN PO) Take 1 tablet by mouth daily.   . NON FORMULARY humalog omnipod three times daily adjusted to food intake  . oxyCODONE-acetaminophen (PERCOCET) 10-325 MG tablet Take 1 tablet by mouth every 4 (four) hours as needed for pain.      Allergies: No Known Allergies  Social History: The patient  reports that he has never smoked. He has never used smokeless tobacco. He reports that he does not drink alcohol or use drugs.   Family History: The patient's Family history is unknown by patient. Mother is alive.   Review of Systems: Please see the history of present illness.   All other systems are reviewed and negative.   Physical Exam: VS:  BP 100/62   Pulse 94   Ht 5\' 5"  (1.651 m)   Wt 244 lb (110.7 kg)   BMI 40.60 kg/m  .  BMI Body mass index is 40.6 kg/m.  Wt Readings from Last 3 Encounters:  04/30/19 244 lb (110.7 kg)  04/27/19 242 lb (109.8 kg)  04/20/19 244 lb 1.9 oz (110.7 kg)    General: Pleasant. Alert and in no acute distress. Looks older than his stated age.   HEENT: Normal.  Neck: Supple, no JVD, carotid bruits, or masses noted.  Cardiac: Regular rate and rhythm. No murmurs, rubs, or gallops. Legs are quite full with over 2+ edema bilaterally - both legs are tight.   Respiratory:  Lungs are clear to auscultation bilaterally with normal work of breathing.  GI: Soft and nontender.  MS: No deformity or atrophy. Gait and ROM intact. Using a cane.  Skin: Warm and dry. Color is normal.  Neuro:  Strength and sensation are intact and no gross focal deficits noted.  Psych: Alert, appropriate and with normal affect.   LABORATORY DATA:  EKG:  EKG is not ordered today.  Lab Results  Component Value Date   WBC 7.6 04/12/2019   HGB 10.8 (L) 04/12/2019   HCT 38.4 (L) 04/12/2019   PLT 329 04/12/2019   GLUCOSE 192 (H) 04/12/2019   ALT 14 10/13/2017   AST 17 10/13/2017   NA 139 04/12/2019   K 6.3 (HH)  04/12/2019   CL 96 (L) 04/12/2019   CREATININE 9.21 (H) 04/12/2019   BUN 26 (H) 04/12/2019   CO2 23 04/12/2019   TSH 0.224 (L) 10/13/2017   INR 1.0 04/12/2019   HGBA1C 10.7 (H) 03/13/2016       BNP (last 3 results) No results for input(s): BNP in the last 8760 hours.  ProBNP (last 3 results) No results for input(s): PROBNP in the last 8760 hours.   Other Studies Reviewed Today:  ECHO IMPRESSIONS 04/2018  1. Left ventricular ejection fraction, by visual estimation, is 40 to  45%. The left ventricle has mildly decreased function. There is mildly  increased left ventricular hypertrophy.  2. Elevated left atrial and left ventricular end-diastolic pressures.  3. Left ventricular diastolic parameters are consistent with Grade II  diastolic dysfunction (pseudonormalization).  4. The left ventricle demonstrates global hypokinesis.  5. Global right ventricle was not well visualized.The right ventricular  size is not well visualized. Right vetricular wall thickness was not  assessed.  6. Left atrial size was normal.  7. Right atrial size was normal.  8. Mild mitral annular calcification.  9. The mitral valve is abnormal. Trivial mitral valve regurgitation.  10. The tricuspid valve is grossly normal.  11. The tricuspid valve is grossly normal. Tricuspid valve regurgitation  is mild.  12. Aortic valve mean gradient measures 9.0 mmHg.  13. Aortic valve peak gradient measures 17.3 mmHg.  14. The aortic valve is tricuspid. Aortic valve regurgitation is not  visualized. Mild aortic valve sclerosis without stenosis.  15. The pulmonic valve was not well visualized. Pulmonic valve  regurgitation is not visualized.  16. The interatrial septum was not well visualized.    TEE 10/17/17  Study Conclusions   - Left ventricle: Systolic function was normal. The estimated  ejection fraction was in the range of 60% to 65%. Wall motion was  normal; there were no regional wall  motion abnormalities.  - Left atrium: No evidence of thrombus in the atrial cavity or  appendage. No evidence of thrombus  in the atrial cavity or  appendage. No evidence of thrombus in the appendage.  - Right atrium: No evidence of thrombus in the atrial cavity or  appendage. No evidence of thrombus in the atrial cavity or  appendage.  - Atrial septum: No defect or patent foramen ovale was identified.  Echo contrast study showed no right-to-left atrial level shunt,  at baseline or with provocation.  - Impressions: No evidence of endocarditis. No cardiac source of  emboli was indentified.   Impressions:   - No evidence of endocarditis. No cardiac source of emboli was  indentified.     PV angio done at [facility] 11/11/17 Findings:  1. Aortic arch with normal configuration and no significant atherosclerosis of the supra-aortic trunks 2. Right subclavian, axillary, and brachial arteries with no severe stenosis 3. Patent right radio-cephalic AVF 4. Radial artery occlusion distally 5. Right ulnar artery filling the palmar arch which is intact and filling all five digits 6. Left subclavian, axillary, brachial arteries widely patent and no stenosis 7. Left radial patent to the wrist and fills the hand 8. Left Ulnar diminutive and occludes above the wrist level   Echo Study Conclusions 01/2017  - Left ventricle: Poor image quality even with definity ? diffuse  hypokinesis worse in septum and apex. Systolic function was  mildly to moderately reduced. The estimated ejection fraction was  in the range of 40% to 45%. Doppler parameters are consistent  with abnormal left ventricular relaxation (grade 1 diastolic  dysfunction).  - Mitral valve: Calcified annulus. Mildly thickened leaflets .  - Right ventricle: The cavity size was mildly dilated.   ASSESSMENT AND PLAN:  1. Lower extremity edema - probably multifactorial - suspect some degree of venous  insufficiency as well. His weight is basically unchanged over the past few years. EF is actually back to where it was in 2018. He is having further vascular studies early next month.   2. Chronic systolic HF - EF now back to where it has been previously - he is not able to be on guideline therapy - given other issues ?vasculitis/finger amputations, etc - I am not inclined to proceed on with cardiac catheterization without input from Dr. Tamala Julian Elta [place] is in agreement and has follow up with Dr. Tamala Julian next month. This could certainly be "silent ischemia" given his other risk factors - but interestingly, he is not short of breath at all. His fatigue seems chronic to me.   3. Prior episode of SVT - treated with Adenosine - this was in the setting of a potassium of 6.3 - this has not recurred.   4. Idiopathic hypotension  5. ESRD - on HD - prior failed transplant (after just a few hours apparently) - not eligible to be relisted.  - followed by several specialists - dialysis not always able to be completed. I think this probably impacts his volume status as well.   6. Raynaud's - has had digit amputations - these have healed - still quite tender.   7. Poorly controlled DM - per PCP  8. HLD - off statin - need to address on follow up.   9. COVID-19 Education: The signs and symptoms of COVID-19 were discussed with the patient and how to seek care for testing (follow up with PCP or arrange E-visit).  The importance of social distancing, staying at home, hand hygiene and wearing a mask when out in public were discussed today.  Current medicines are reviewed with the patient today.  The  patient does not have concerns regarding medicines other than what has been noted above.  The following changes have been made:  See above.  Labs/ tests ordered today include:   No orders of the defined types were placed in this encounter.    Disposition:   FU with Dr. Tamala Julian next month as planned.    Patient is  agreeable to this plan and will call if any problems develop in the interim.   SignedTruitt Merle, NP  04/30/2019 12:02 PM  [facility] [address] [place], [place]  [address] Phone: [phone] Fax: [phone]

## 2019-04-26 ENCOUNTER — Telehealth (HOSPITAL_COMMUNITY): Payer: Self-pay

## 2019-04-26 ENCOUNTER — Other Ambulatory Visit: Payer: Self-pay

## 2019-04-26 DIAGNOSIS — I739 Peripheral vascular disease, unspecified: Secondary | ICD-10-CM

## 2019-04-26 DIAGNOSIS — N186 End stage renal disease: Secondary | ICD-10-CM | POA: Diagnosis not present

## 2019-04-26 DIAGNOSIS — N2581 Secondary hyperparathyroidism of renal origin: Secondary | ICD-10-CM | POA: Diagnosis not present

## 2019-04-26 DIAGNOSIS — D509 Iron deficiency anemia, unspecified: Secondary | ICD-10-CM | POA: Diagnosis not present

## 2019-04-26 DIAGNOSIS — E119 Type 2 diabetes mellitus without complications: Secondary | ICD-10-CM | POA: Diagnosis not present

## 2019-04-26 DIAGNOSIS — D631 Anemia in chronic kidney disease: Secondary | ICD-10-CM | POA: Diagnosis not present

## 2019-04-26 NOTE — Telephone Encounter (Signed)

## 2019-04-27 ENCOUNTER — Ambulatory Visit (HOSPITAL_COMMUNITY)
Admission: RE | Admit: 2019-04-27 | Discharge: 2019-04-27 | Disposition: A | Discharge status: Home / Self Care | Payer: Medicare Other | Source: Ambulatory Visit | Attending: Surgery | Admitting: Surgery

## 2019-04-27 ENCOUNTER — Other Ambulatory Visit: Payer: Self-pay

## 2019-04-27 ENCOUNTER — Ambulatory Visit (INDEPENDENT_AMBULATORY_CARE_PROVIDER_SITE_OTHER): Payer: Medicare Other | Admitting: Vascular Surgery

## 2019-04-27 VITALS — BP 102/70 | HR 100 | Temp 97.2°F | Resp 20 | Ht 65.0 in | Wt 242.0 lb

## 2019-04-27 DIAGNOSIS — I739 Peripheral vascular disease, unspecified: Secondary | ICD-10-CM | POA: Insufficient documentation

## 2019-04-27 DIAGNOSIS — R600 Localized edema: Secondary | ICD-10-CM

## 2019-04-27 NOTE — Progress Notes (Signed)
Patient ID: [name] [name], male   DOB: April 24, 1965, 54 y.o.   MRN: [id number]  Reason for Consult: New Patient (Initial Visit)   Referred by [name], MD  Subjective:     HPI:  [name] [name] is a 54 y.o. male with bilateral lower extremity pain which is unrelenting, moderate to severe and nonradiating.  He states it has been going on for a few weeks.  Pain is related to swelling in the bilateral legs.  He does have some beginnings of wound on the left leg.  Patient states that he has Raynaud's syndrome and has lost fingers on both hands cared for at [facility].  He is on dialysis via right arm AV fistula that was placed in [place].  He has never had lower extremity arterial or venous procedures.  He does have compression stockings at home but does not wear them.  Past Medical History:  Diagnosis Date  . Anal infection    posterior anal canal  . Anemia, chronic renal failure   . Atrophic kidney    BILATERAL  . DM type 2 causing ESRD [facility])    Nephrologist-- dr [name] [facility])--  on hemodialysis since June 2012 at  [name]  TTS  . Hemodialysis patient [facility])    at [place] on Tues/ Thur/Sat/schedule  . Hemorrhoids   . Hepatitis B antibody positive   . History of pleural effusion    bilateral  . Hyperparathyroidism, secondary renal ([place])   . Hypertension   . Ischemic cardiomyopathy    per echo 07-01-2014  ef 45%  . LAFB (left anterior fascicular block)   . Peripheral neuropathy   . Systolic and diastolic CHF, chronic ([place])    CARDIOLOGIST-  DR Daneen Schick ([place])  AND DR [name] ([name])   Family History  Family history unknown: Yes   Past Surgical History:  Procedure Laterality Date  . APPENDECTOMY  09-12-2004   laparotomy w/ drainage peritinitis  . AV FISTULA PLACEMENT  02-27-2010   right forearm (RADIOCEPHALIC)  . AV FISTULA REPAIR  10-30-2010  . CARDIOVASCULAR STRESS TEST  10-29-2011   dr  Daneen Schick   Low risk scan;  mild perfusion defect seen in the basal inferoseptal, basal inferior and mid inferior regions consistent with an infarct/scar and/or overlying attenuation/  mild to moderate global LVSF,  ef 40-45%  . DOBUTAMINE STRESS ECHO  07-23-2012   [name]   abnormal ;  at rest estimated lvef 25-30% and global severe LV hypokinesis ;  no cp during stress and achieved 85% maxium predicted heart rate;  negative stress ECG for inducible ischemia;  estimated lvef with stress 35-40%;  augmentation of wall segments consistant with cardiomyopathy and differential fibrosis  . FISTULOTOMY N/A 03/26/2015   Procedure: FISTULOTOMY;  Surgeon: [name], MD;  Location: [facility];  Service: General;  Laterality: N/A;  . INCISION AND DRAINAGE ABSCESS N/A 03/26/2015   Procedure: ANAL INCISION AND DRAINAGE;  Surgeon: [name], MD;  Location: [place];  Service: General;  Laterality: N/A;  . RETINAL DETACHMENT SURGERY Left 2011   incomplete repair/ needs eye drops to keep pressure down  . TEE WITHOUT CARDIOVERSION N/A 10/17/2017   Procedure: TRANSESOPHAGEAL ECHOCARDIOGRAM (TEE);  Surgeon: Josue Hector, MD;  Location: [facility] ENDOSCOPY;  Service: Cardiovascular;  Laterality: N/A;  . TRANSTHORACIC ECHOCARDIOGRAM  07-01-2014    done at [facility]   grade 1 diastolic dysfunction,  ef  45%/  trace TR and PR    Short Social History:  Social History   Tobacco Use  . Smoking status: Never Smoker  . Smokeless tobacco: Never Used  Substance Use Topics  . Alcohol use: No    No Known Allergies  Current Outpatient Medications  Medication Sig Dispense Refill  . acetaminophen (TYLENOL) 500 MG tablet Take 1,000 mg by mouth as needed.    [name] aspirin 325 MG tablet Take 325 mg by mouth daily.    . calcium acetate (PHOSLO) 667 MG capsule Take 2,001 mg by mouth 3 (three) times daily with meals.     . celecoxib (CELEBREX) 100 MG capsule Take 100 mg by mouth  daily.    . cyanocobalamin 1000 MCG tablet Take by mouth.    . cycloSPORINE (RESTASIS) 0.05 % ophthalmic emulsion Place 1 drop into both eyes 2 (two) times daily.    . febuxostat (ULORIC) 40 MG tablet Take 40 mg by mouth daily.     . ferric citrate (AURYXIA) 1 GM 210 MG(Fe) tablet Take 420 mg by mouth 3 (three) times daily.    [name] gabapentin (NEURONTIN) 300 MG capsule Take 300 mg by mouth 3 (three) times daily as needed. For pain  3  . Insulin Aspart Prot & Aspart (NOVOLOG MIX 70/30 PENFILL [place]) Inject 14 Units into the skin 3 (three) times daily. Adjusts dose if CBG greater than 150    . midodrine (PROAMATINE) 5 MG tablet Take 5 mg by mouth 3 (three) times a week. Every Tuesday Thursday and Saturday    . Multiple Vitamins-Minerals (MEGA MULTIVITAMIN FOR MEN PO) Take 1 tablet by mouth daily.     [name] oxyCODONE-acetaminophen (PERCOCET) 10-325 MG tablet Take 1 tablet by mouth every 4 (four) hours as needed for pain.      No current facility-administered medications for this visit.    Review of Systems  Constitutional:  Constitutional negative. HENT: HENT negative.  Eyes: Eyes negative.  Cardiovascular: Positive for leg swelling.  GI: Gastrointestinal negative.  Musculoskeletal: Positive for leg pain.  Skin: Positive for wound.  Neurological: Neurological negative. Hematologic: Hematologic/lymphatic negative.  Psychiatric: Psychiatric negative.        Objective:  Objective   Vitals:   04/27/19 1005  BP: 102/70  Pulse: 100  Resp: 20  Temp: (!) 97.2 F (36.2 C)  SpO2: 99%  Weight: 242 lb (109.8 kg)  Height: 5\' 5"  (1.651 m)   Body mass index is 40.27 kg/m.  Physical Exam Eyes:     Pupils: Pupils are equal, round, and reactive to light.  Neck:     Vascular: No carotid bruit.  Cardiovascular:     Rate and Rhythm: Regular rhythm.     Pulses: Normal pulses.  Abdominal:     General: Abdomen is flat.     Palpations: Abdomen is soft.  Musculoskeletal:     Right lower leg: Edema  present.     Left lower leg: Edema present.  Skin:    Capillary Refill: Capillary refill takes less than 2 seconds.  Neurological:     General: No focal deficit present.     Mental Status: He is alert.  Psychiatric:        Mood and Affect: Mood normal.        Thought Content: Thought content normal.        Judgment: Judgment normal.     Data: I have independently turbid his ABIs to be 1.6 right and 1.6 left triphasic bilaterally.  Assessment/Plan:     54 year old male with multifactorial bilateral lower extremity swelling and pain.  ABIs are preserved.  He is set to have cardiac evaluation he is already on dialysis.  We will get venous reflux studies.  I recommended compression stockings when out of bed and elevation of legs when recumbent and in between walking as tolerated.     Waynetta Sandy MD [facility]

## 2019-04-28 DIAGNOSIS — D509 Iron deficiency anemia, unspecified: Secondary | ICD-10-CM | POA: Diagnosis not present

## 2019-04-28 DIAGNOSIS — D631 Anemia in chronic kidney disease: Secondary | ICD-10-CM | POA: Diagnosis not present

## 2019-04-28 DIAGNOSIS — N186 End stage renal disease: Secondary | ICD-10-CM | POA: Diagnosis not present

## 2019-04-28 DIAGNOSIS — N2581 Secondary hyperparathyroidism of renal origin: Secondary | ICD-10-CM | POA: Diagnosis not present

## 2019-04-30 ENCOUNTER — Other Ambulatory Visit: Payer: Self-pay

## 2019-04-30 ENCOUNTER — Other Ambulatory Visit: Payer: Self-pay | Admitting: *Deleted

## 2019-04-30 ENCOUNTER — Ambulatory Visit (INDEPENDENT_AMBULATORY_CARE_PROVIDER_SITE_OTHER): Payer: Medicare Other | Admitting: Nurse Practitioner

## 2019-04-30 ENCOUNTER — Encounter: Payer: Self-pay | Admitting: Nurse Practitioner

## 2019-04-30 VITALS — BP 100/62 | HR 94 | Ht 65.0 in | Wt 244.0 lb

## 2019-04-30 DIAGNOSIS — Z992 Dependence on renal dialysis: Secondary | ICD-10-CM | POA: Diagnosis not present

## 2019-04-30 DIAGNOSIS — I5022 Chronic systolic (congestive) heart failure: Secondary | ICD-10-CM

## 2019-04-30 DIAGNOSIS — N186 End stage renal disease: Secondary | ICD-10-CM

## 2019-04-30 DIAGNOSIS — R600 Localized edema: Secondary | ICD-10-CM

## 2019-04-30 DIAGNOSIS — U071 COVID-19: Secondary | ICD-10-CM | POA: Diagnosis not present

## 2019-04-30 DIAGNOSIS — E1165 Type 2 diabetes mellitus with hyperglycemia: Secondary | ICD-10-CM | POA: Diagnosis not present

## 2019-04-30 DIAGNOSIS — N4829 Other inflammatory disorders of penis: Secondary | ICD-10-CM | POA: Diagnosis not present

## 2019-04-30 DIAGNOSIS — I10 Essential (primary) hypertension: Secondary | ICD-10-CM

## 2019-04-30 DIAGNOSIS — I471 Supraventricular tachycardia: Secondary | ICD-10-CM | POA: Diagnosis not present

## 2019-04-30 DIAGNOSIS — L942 Calcinosis cutis: Secondary | ICD-10-CM | POA: Diagnosis not present

## 2019-04-30 DIAGNOSIS — I739 Peripheral vascular disease, unspecified: Secondary | ICD-10-CM

## 2019-04-30 NOTE — Patient Instructions (Addendum)
After Visit Summary:  We will be checking the following labs today - NONE   Medication Instructions:    Continue with your current medicines.    If you need a refill on your cardiac medications before your next appointment, please call your pharmacy.     Testing/Procedures To Be Arranged:  N/A  Follow-Up:   See Dr. Tamala Julian as planned next month.     At [facility], you and your health needs are our priority.  As part of our continuing mission to provide you with exceptional heart care, we have created designated Provider Care Teams.  These Care Teams include your primary Cardiologist (physician) and Advanced Practice Providers (APPs -  Physician Assistants and Nurse Practitioners) who all work together to provide you with the care you need, when you need it.  Special Instructions:  . Stay safe, stay home, wash your hands for at least 20 seconds and wear a mask when out in public.  . It was good to talk with you today.  . I am going to talk with Dr. Tamala Julian - we will be in touch.    Call the [place] office at [phone] if you have any questions, problems or concerns.

## 2019-05-01 DIAGNOSIS — N2581 Secondary hyperparathyroidism of renal origin: Secondary | ICD-10-CM | POA: Diagnosis not present

## 2019-05-01 DIAGNOSIS — D631 Anemia in chronic kidney disease: Secondary | ICD-10-CM | POA: Diagnosis not present

## 2019-05-01 DIAGNOSIS — D509 Iron deficiency anemia, unspecified: Secondary | ICD-10-CM | POA: Diagnosis not present

## 2019-05-01 DIAGNOSIS — N186 End stage renal disease: Secondary | ICD-10-CM | POA: Diagnosis not present

## 2019-05-01 DIAGNOSIS — L989 Disorder of the skin and subcutaneous tissue, unspecified: Secondary | ICD-10-CM | POA: Diagnosis not present

## 2019-05-03 DIAGNOSIS — D509 Iron deficiency anemia, unspecified: Secondary | ICD-10-CM | POA: Diagnosis not present

## 2019-05-03 DIAGNOSIS — N186 End stage renal disease: Secondary | ICD-10-CM | POA: Diagnosis not present

## 2019-05-03 DIAGNOSIS — D631 Anemia in chronic kidney disease: Secondary | ICD-10-CM | POA: Diagnosis not present

## 2019-05-03 DIAGNOSIS — N2581 Secondary hyperparathyroidism of renal origin: Secondary | ICD-10-CM | POA: Diagnosis not present

## 2019-05-05 DIAGNOSIS — N2581 Secondary hyperparathyroidism of renal origin: Secondary | ICD-10-CM | POA: Diagnosis not present

## 2019-05-05 DIAGNOSIS — D631 Anemia in chronic kidney disease: Secondary | ICD-10-CM | POA: Diagnosis not present

## 2019-05-05 DIAGNOSIS — D509 Iron deficiency anemia, unspecified: Secondary | ICD-10-CM | POA: Diagnosis not present

## 2019-05-05 DIAGNOSIS — N186 End stage renal disease: Secondary | ICD-10-CM | POA: Diagnosis not present

## 2019-05-07 ENCOUNTER — Telehealth: Payer: Self-pay | Admitting: *Deleted

## 2019-05-07 NOTE — Telephone Encounter (Signed)
Per Cecille Rubin and Dr. Tamala Julian pt is to stay on same medications with no cardiac cath and keep fu next month with Dr.Smith.

## 2019-05-08 DIAGNOSIS — N2581 Secondary hyperparathyroidism of renal origin: Secondary | ICD-10-CM | POA: Diagnosis not present

## 2019-05-08 DIAGNOSIS — D631 Anemia in chronic kidney disease: Secondary | ICD-10-CM | POA: Diagnosis not present

## 2019-05-08 DIAGNOSIS — N186 End stage renal disease: Secondary | ICD-10-CM | POA: Diagnosis not present

## 2019-05-08 DIAGNOSIS — D509 Iron deficiency anemia, unspecified: Secondary | ICD-10-CM | POA: Diagnosis not present

## 2019-05-10 DIAGNOSIS — D631 Anemia in chronic kidney disease: Secondary | ICD-10-CM | POA: Diagnosis not present

## 2019-05-10 DIAGNOSIS — N2581 Secondary hyperparathyroidism of renal origin: Secondary | ICD-10-CM | POA: Diagnosis not present

## 2019-05-10 DIAGNOSIS — N186 End stage renal disease: Secondary | ICD-10-CM | POA: Diagnosis not present

## 2019-05-10 DIAGNOSIS — D509 Iron deficiency anemia, unspecified: Secondary | ICD-10-CM | POA: Diagnosis not present

## 2019-05-12 DIAGNOSIS — D631 Anemia in chronic kidney disease: Secondary | ICD-10-CM | POA: Diagnosis not present

## 2019-05-12 DIAGNOSIS — D509 Iron deficiency anemia, unspecified: Secondary | ICD-10-CM | POA: Diagnosis not present

## 2019-05-12 DIAGNOSIS — N186 End stage renal disease: Secondary | ICD-10-CM | POA: Diagnosis not present

## 2019-05-12 DIAGNOSIS — N2581 Secondary hyperparathyroidism of renal origin: Secondary | ICD-10-CM | POA: Diagnosis not present

## 2019-05-14 DIAGNOSIS — L988 Other specified disorders of the skin and subcutaneous tissue: Secondary | ICD-10-CM | POA: Diagnosis not present

## 2019-05-15 DIAGNOSIS — N2581 Secondary hyperparathyroidism of renal origin: Secondary | ICD-10-CM | POA: Diagnosis not present

## 2019-05-15 DIAGNOSIS — D509 Iron deficiency anemia, unspecified: Secondary | ICD-10-CM | POA: Diagnosis not present

## 2019-05-15 DIAGNOSIS — N186 End stage renal disease: Secondary | ICD-10-CM | POA: Diagnosis not present

## 2019-05-15 DIAGNOSIS — D631 Anemia in chronic kidney disease: Secondary | ICD-10-CM | POA: Diagnosis not present

## 2019-05-16 ENCOUNTER — Encounter (HOSPITAL_BASED_OUTPATIENT_CLINIC_OR_DEPARTMENT_OTHER): Payer: Medicare Other | Attending: Physician Assistant | Admitting: Physician Assistant

## 2019-05-16 ENCOUNTER — Other Ambulatory Visit: Payer: Self-pay

## 2019-05-16 DIAGNOSIS — I509 Heart failure, unspecified: Secondary | ICD-10-CM | POA: Diagnosis not present

## 2019-05-16 DIAGNOSIS — I132 Hypertensive heart and chronic kidney disease with heart failure and with stage 5 chronic kidney disease, or end stage renal disease: Secondary | ICD-10-CM | POA: Insufficient documentation

## 2019-05-16 DIAGNOSIS — E1140 Type 2 diabetes mellitus with diabetic neuropathy, unspecified: Secondary | ICD-10-CM | POA: Diagnosis not present

## 2019-05-16 DIAGNOSIS — L98492 Non-pressure chronic ulcer of skin of other sites with fat layer exposed: Secondary | ICD-10-CM | POA: Diagnosis not present

## 2019-05-16 DIAGNOSIS — I7300 Raynaud's syndrome without gangrene: Secondary | ICD-10-CM | POA: Insufficient documentation

## 2019-05-16 DIAGNOSIS — Z992 Dependence on renal dialysis: Secondary | ICD-10-CM | POA: Diagnosis not present

## 2019-05-16 DIAGNOSIS — E11622 Type 2 diabetes mellitus with other skin ulcer: Secondary | ICD-10-CM | POA: Diagnosis not present

## 2019-05-16 DIAGNOSIS — Z89029 Acquired absence of unspecified finger(s): Secondary | ICD-10-CM | POA: Insufficient documentation

## 2019-05-16 DIAGNOSIS — E1122 Type 2 diabetes mellitus with diabetic chronic kidney disease: Secondary | ICD-10-CM | POA: Insufficient documentation

## 2019-05-16 DIAGNOSIS — N186 End stage renal disease: Secondary | ICD-10-CM | POA: Diagnosis not present

## 2019-05-16 DIAGNOSIS — N485 Ulcer of penis: Secondary | ICD-10-CM | POA: Insufficient documentation

## 2019-05-17 DIAGNOSIS — N186 End stage renal disease: Secondary | ICD-10-CM | POA: Diagnosis not present

## 2019-05-17 DIAGNOSIS — N2581 Secondary hyperparathyroidism of renal origin: Secondary | ICD-10-CM | POA: Diagnosis not present

## 2019-05-17 DIAGNOSIS — D509 Iron deficiency anemia, unspecified: Secondary | ICD-10-CM | POA: Diagnosis not present

## 2019-05-17 DIAGNOSIS — D631 Anemia in chronic kidney disease: Secondary | ICD-10-CM | POA: Diagnosis not present

## 2019-05-17 NOTE — Progress Notes (Signed)
SHEPHEN, LASHLEE ([id number]) Visit Report for 05/16/2019 Chief Complaint Document Details Patient Name: Date of Service: [name] [name], [name] [name] 05/16/2019 9:00 AM Medical Record [id number] Patient Account Number: [id number] Date of Birth/Sex: [birth year] (53 y.o. Male) Treating RN: Baruch Gouty Primary Care Provider: [name] Other Clinician: Referring Provider: Treating Provider/Extender:Stone III, Rhea Bleacher, Marylyn Ishihara in Treatment: 0 Information Obtained from: Patient Chief Complaint Penis Ulcer Electronic Signature(s) Signed: 05/16/2019 10:52:55 AM By: Worthy Keeler PA-C Entered By: Worthy Keeler on 05/16/2019 10:52:54 -------------------------------------------------------------------------------- HPI Details Patient Name: Date of Service: [name] [name], [name] [name] 05/16/2019 9:00 AM Medical Record [id number] Patient Account Number: [id number] Date of Birth/Sex: Jun 06, 1965 (53 y.o. Male) Treating RN: Baruch Gouty Primary Care Provider: [name] Other Clinician: Referring Provider: Treating Provider/Extender:Stone III, Rhea Bleacher, Marylyn Ishihara in Treatment: 0 History of Present Illness HPI Description: 05/16/2019 patient presents today for initial evaluation here in our clinic concerning an issue he has been having with a wound on the dorsal surface of the distal portion of his penis. This has been present he tells me for about 2 months I do have a note from the urologist which actually states 4 months. 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 With that being said the patient has also been seen by his primary care provider he tells [place] that he has been screened for sexually transmitted diseases including syphilis although we did contact his primary care provider as well who states that they did not have any testing on him as such.  Urology also did not perform any testing. The patient tells me he has not been sexually active since 2014 that he has not had any ability to do so. He is on dialysis and unfortunately is having a lot of issues as far as sexual dysfunction is concerned. 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 He tells me that the ulcer came out first as a bump on the end of his penis which was tender to touch. He continue to watch this and unfortunately as things progressed he states that it eventually opened, ulcerated, and has been extremely painful. He really does not urinate secondary to being on dialysis and his end- stage renal disease. He does have diabetes mellitus type 2. Electronic Signature(s) Signed: 05/16/2019 1:19:22 PM By: Worthy Keeler PA-C Entered By: Worthy Keeler on 05/16/2019 13:19:22 -------------------------------------------------------------------------------- Physical Exam Details Patient Name: Date of Service: [name] [name], [name] [name] 05/16/2019 9:00 AM Medical Record [id number] Patient Account Number: [id number] Date of Birth/Sex: [birth year] (53 y.o. Male) Treating RN: Baruch Gouty Primary Care Provider: [name] Other Clinician: Referring Provider: Treating Provider/Extender:Stone III, Rhea Bleacher, Marylyn Ishihara in Treatment: 0 Constitutional sitting or standing blood pressure is within target range for patient.. pulse regular and within target range for patient.[name] respirations regular, non-labored and within target range for patient.[name] temperature within target range for patient.. Well-nourished and well-hydrated in no acute distress. Eyes conjunctiva clear no eyelid edema noted. pupils equal round and reactive to light and accommodation. Ears, Nose, Mouth, and Throat no gross abnormality of ear auricles or external auditory canals. normal hearing noted  during conversation. mucus membranes moist. Respiratory normal breathing without difficulty. Cardiovascular no clubbing, cyanosis, significant edema, <3 sec cap refill. Gastrointestinal (GI) soft, non-tender, non-distended, +BS. no ventral hernia noted. Musculoskeletal  Patient unable to walk without assistance. no significant deformity or arthritic changes, no loss or range of motion, no clubbing. Psychiatric this patient is able to make decisions and demonstrates good insight into disease process. Alert and Oriented x 3. pleasant and cooperative. Notes Upon inspection today patient's wound bed actually showed signs of having some necrotic tissue proximal on the wound and ulceration more distal. There was some macerated type tissue along the edges of the wound. This does appear to be extremely painful when I was attempting to evaluate this during the office visit today. With that being said I do not see anything that looks herpetic in nature as far as any type of rash is concerned also did check inguinal lymph nodes and I did not really see any inguinal lymphadenopathy that was visible nor palpated at this point. The patient again does have a fairly significant ulceration here however and there is some necrotic tissue which I think potentially Santyl may be beneficial for her at this point. Electronic Signature(s) Signed: 05/16/2019 1:20:23 PM By: Worthy Keeler PA-C Entered By: Worthy Keeler on 05/16/2019 13:20:23 -------------------------------------------------------------------------------- Physician Orders Details Patient Name: Date of Service: [name] [name], [name] [name] 05/16/2019 9:00 AM Medical Record [id number] Patient Account Number: [id number] Date of Birth/Sex: 03/18/1966 (53 y.o. Male) Treating RN: Baruch Gouty Primary Care Provider: [name] Other Clinician: Referring Provider: Treating Provider/Extender:Stone III, Rhea Bleacher, Marylyn Ishihara in  Treatment: 0 Verbal / Phone Orders: No Diagnosis Coding ICD-10 Coding Code Description N48.5 Ulcer of penis N18.6 End stage renal disease Z99.2 Dependence on renal dialysis E11.622 Type 2 diabetes mellitus with other skin ulcer Follow-up Appointments Return Appointment in 1 week. Dressing Change Frequency Wound #1 Penis Change dressing every day. Wound Cleansing Wound #1 Penis May shower and wash wound with soap and water. Primary Wound Dressing Wound #1 Penis Santyl Ointment Secondary Dressing Wound #1 Penis Kerlix/Rolled Gauze Other: - saline moistened gauze over santyl [employer] rpr - r/o syphilis Patient Medications Allergies: No Known Drug Allergies Notifications Medication Indication Start End Santyl 05/16/2019 DOSE topical 250 unit/gram ointment - ointment topical Apply nickel thick to the wound bed and then cover with a dressing as directed in clinic. Electronic Signature(s) Signed: 05/16/2019 10:56:37 AM By: Worthy Keeler PA-C Entered By: Worthy Keeler on 05/16/2019 10:56:36 -------------------------------------------------------------------------------- Prescription 05/16/2019 Patient Name: Lambert Mody L. Provider: Worthy Keeler PA Date of Birth: 12-03-1965 NPI#: [id number] Sex: Male DEA#: [id number] Phone #: [phone] License #: Patient Address: [address] [place], [place] [address] [place], [place] [address] [phone] Allergies No Known Drug Allergies Provider's Orders rpr - r/o syphilis Signature(s): Date(s): Electronic Signature(s) Signed: 05/16/2019 5:55:18 PM By: Worthy Keeler PA-C Entered By: Worthy Keeler on 05/16/2019 10:56:37 --------------------------------------------------------------------------------  Problem List Details Patient Name: Date of Service: [name] [name], [name] [name] 05/16/2019 9:00 AM Medical Record [id number] Patient Account  Number: [id number] Date of Birth/Sex: 05-Aug-1965 (53 y.o. Male) Treating RN: Baruch Gouty Primary Care Provider: [name] Other Clinician: Referring Provider: Treating Provider/Extender:Stone III, Rhea Bleacher, Marylyn Ishihara in Treatment: 0 Active Problems ICD-10 Evaluated Encounter Code Description Active Date Today Diagnosis N48.5 Ulcer of penis 05/16/2019 No Yes N18.6 End stage renal disease 05/16/2019 No Yes Z99.2 Dependence on renal dialysis 05/16/2019 No Yes E11.622 Type 2 diabetes mellitus with other skin ulcer 05/16/2019 No Yes Inactive Problems Resolved Problems Electronic Signature(s) Signed: 05/16/2019 10:04:15 AM By: Worthy Keeler PA-C Entered By:  Worthy Keeler on 05/16/2019 10:04:15 -------------------------------------------------------------------------------- Progress Note Details Patient Name: Date of Service: [name] [name], [name] [name] 05/16/2019 9:00 AM Medical Record [id number] Patient Account Number: [id number] Date of Birth/Sex: [birth year] (53 y.o. Male) Treating RN: Baruch Gouty Primary Care Provider: [name] Other Clinician: Referring Provider: Treating Provider/Extender:Stone III, Rhea Bleacher, Marylyn Ishihara in Treatment: 0 Subjective Chief Complaint Information obtained from Patient Penis Ulcer History of Present Illness (HPI) 05/16/2019 patient presents today for initial evaluation here in our clinic concerning an issue he has been having with a wound on the dorsal surface of the distal portion of his penis. This has been present he tells me for about 2 months I do have a note from the urologist which actually states 4 months. 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 With that being said the patient has also been seen by his primary care provider he tells [place] that he has been screened for sexually transmitted  diseases including syphilis although we did contact his primary care provider as well who states that they did not have any testing on him as such. Urology also did not perform any testing. The patient tells me he has not been sexually active since 2014 that he has not had any ability to do so. He is on dialysis and unfortunately is having a lot of issues as far as sexual dysfunction is concerned. 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 He tells me that the ulcer came out first as a bump on the end of his penis which was tender to touch. He continue to watch this and unfortunately as things progressed he states that it eventually opened, ulcerated, and has been extremely painful. He really does not urinate secondary to being on dialysis and his end-stage renal disease. He does have diabetes mellitus type 2. Patient History Information obtained from Patient. Allergies No Known Drug Allergies Social History Never smoker, Marital Status - Single, Alcohol Use - Rarely, Drug Use - No History, Caffeine Use - Rarely. Medical History Hematologic/Lymphatic Patient has history of Anemia Cardiovascular Patient has history of Congestive Heart Failure, Hypertension Endocrine Patient has history of Type II Diabetes Genitourinary Patient has history of End Stage Renal Disease Immunological Patient has history of Raynaudoos Neurologic Patient has history of Neuropathy Patient is treated with Insulin. Blood sugar is tested. 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 Eyes Complains or has symptoms of Glasses / Contacts -  glasses. Ear/Nose/Mouth/Throat Denies complaints or symptoms of Chronic sinus problems or rhinitis. Respiratory Denies complaints or symptoms of Chronic or frequent coughs, Shortness of Breath. Gastrointestinal Denies complaints or symptoms of Frequent diarrhea, Nausea, Vomiting. Integumentary (Skin) Complains or has symptoms of Wounds - penile wound. Musculoskeletal Denies complaints or symptoms of Muscle Pain, Muscle Weakness. Psychiatric Denies complaints or symptoms of Claustrophobia, Suicidal. Objective Constitutional sitting or standing blood pressure is within target range for patient.. pulse regular and within target range for patient.[name] respirations regular, non-labored and within target range for patient.[name] temperature within target range for patient.. Well-nourished and well-hydrated in no acute distress. Vitals Time Taken: 9:32 AM, Height: 65 in, Source: Stated,  Weight: 240 lbs, Source: Stated, BMI: 39.9, Temperature: 98.5 F, Pulse: 102 bpm, Respiratory Rate: 18 breaths/min, Blood Pressure: 106/56 mmHg, Capillary Blood Glucose: 189 mg/dl. General Notes: glucose per pt report Eyes conjunctiva clear no eyelid edema noted. pupils equal round and reactive to light and accommodation. Ears, Nose, Mouth, and Throat no gross abnormality of ear auricles or external auditory canals. normal hearing noted during conversation. mucus membranes moist. Respiratory normal breathing without difficulty. Cardiovascular no clubbing, cyanosis, significant edema, Gastrointestinal (GI) soft, non-tender, non-distended, +BS. no ventral hernia noted. Musculoskeletal Patient unable to walk without assistance. no significant deformity or arthritic changes, no loss or range of motion, no clubbing. Psychiatric this patient is able to make decisions and demonstrates good insight into disease process. Alert and Oriented x 3. pleasant and cooperative. General Notes: Upon inspection today patient's  wound bed actually showed signs of having some necrotic tissue proximal on the wound and ulceration more distal. There was some macerated type tissue along the edges of the wound. This does appear to be extremely painful when I was attempting to evaluate this during the office visit today. With that being said I do not see anything that looks herpetic in nature as far as any type of rash is concerned also did check inguinal lymph nodes and I did not really see any inguinal lymphadenopathy that was visible nor palpated at this point. The patient again does have a fairly significant ulceration here however and there is some necrotic tissue which I think potentially Santyl may be beneficial for her at this point. Integumentary (Hair, Skin) Wound #1 status is Open. Original cause of wound was Gradually Appeared. The wound is located on the Penis. The wound measures 2.5cm length x 2.5cm width x 0.2cm depth; 4.909cm^2 area and 0.982cm^3 volume. There is Fat Layer (Subcutaneous Tissue) Exposed exposed. There is no tunneling or undermining noted. There is a medium amount of serosanguineous drainage noted. The wound margin is flat and intact. There is large (67-100%) pink, pale granulation within the wound bed. There is a small (1-33%) amount of necrotic tissue within the wound bed including Adherent Slough. Assessment Active Problems ICD-10 Ulcer of penis End stage renal disease Dependence on renal dialysis Type 2 diabetes mellitus with other skin ulcer Plan Follow-up Appointments: Return Appointment in 1 week. Dressing Change Frequency: Wound #1 Penis: Change dressing every day. Wound Cleansing: Wound #1 Penis: May shower and wash wound with soap and water. Primary Wound Dressing: Wound #1 Penis: Santyl Ointment Secondary Dressing: Wound #1 Penis: Kerlix/Rolled Gauze Other: - saline moistened gauze over santyl ordered were: rpr - r/o syphilis The following medication(s) was  prescribed: Santyl topical 250 unit/gram ointment ointment topical Apply nickel thick to the wound bed and then cover with a dressing as directed in clinic. starting 05/16/2019 1. I suggested for the patient that I think Santyl is probably the ideal option for him right now to see if we can get this necrotic tissue off of the wound. Subsequently I am going to go ahead and send him for an RPR to rule out syphilis as a possibility here. Also discussed the possibility of him potentially needing to have a biopsy just to confirm that there is no signs of malignancy that could be another cause of ulceration here. I am really not convinced of calciphylaxis as the mechanism of injury here. The patient did not describe it any point having a eschar over the area it was more of a "bump" that eventually ruptured and sounds  more along the lines of something infectious in my opinion. Again the typical calciphylaxis picture would be tissue death, eschar, and then as the eschar comes off there can sometimes be ulceration underneath. And then this proceeds to healing apparently there is really been no significant healing occurring at this point. 2. With regard to any other sexually transmitted diseases to be perfectly honest he really has not been sexually active since 2014 I did question him both in regard to intercourse as well as oral sex and again nothing seemed to be positive here. I truly do believe because he tells me that he has not been able to physically its not possible. With that being said we will still get a double check the RPR just to ensure that were not seeing anything there as far as a latent syphilis type issue. 3. We will see how things do with the Santyl over the next week. Again I am still not ruling out the possibility that he may need to be seen by urology or someone to take a tissue sample in order to check pathology for any signs of cancer or potentially pinpointing a diagnosis for what  this ulcer may be. Obviously this is a very inconvenient area to obtain a biopsy which is the reason that I would not do that here in the clinic it would need to be done potentially by urology. I would have to coordinate with them in that case. We will see patient back for reevaluation in 1 week here in the clinic. If anything worsens or changes patient will contact our office for additional recommendations. During the course of the office visit while the patient was here today we contacted both the patient's urologist as well as his primary care provider as far as coordination of care to try to figure out what testing had been done apparently there was no testing that is been performed at this point with regard to sexually transmitted diseases even an RPR at this time. Electronic Signature(s) Signed: 05/16/2019 1:24:20 PM By: Worthy Keeler PA-C Previous Signature: 05/16/2019 1:23:29 PM Version By: Worthy Keeler PA-C Entered By: Worthy Keeler on 05/16/2019 13:24:20 -------------------------------------------------------------------------------- HxROS Details Patient Name: Date of Service: [name] [name] 05/16/2019 9:00 AM Medical Record [id number] Patient Account Number: [id number] Date of Birth/Sex: 1966/01/18 (53 y.o. Male) Treating RN: Levan Hurst Primary Care Provider: [name] Other Clinician: Referring Provider: Treating Provider/Extender:Stone III, Rhea Bleacher, Marylyn Ishihara in Treatment: 0 Information Obtained From Patient Constitutional Symptoms (General Health) Complaints and Symptoms: Negative for: Fatigue; Fever; Chills; Marked Weight Change Eyes Complaints and Symptoms: Positive for: Glasses / Contacts - glasses Ear/Nose/Mouth/Throat Complaints and Symptoms: Negative for: Chronic sinus problems or rhinitis Respiratory Complaints and Symptoms: Negative for: Chronic or frequent coughs; Shortness of Breath Gastrointestinal Complaints and  Symptoms: Negative for: Frequent diarrhea; Nausea; Vomiting Integumentary (Skin) Complaints and Symptoms: Positive for: Wounds - penile wound Musculoskeletal Complaints and Symptoms: Negative for: Muscle Pain; Muscle Weakness Psychiatric Complaints and Symptoms: Negative for: Claustrophobia; Suicidal Hematologic/Lymphatic Medical History: Positive for: Anemia Cardiovascular Medical History: Positive for: Congestive Heart Failure; Hypertension Past Medical History Notes: Cardiomyopathy, arterial clots Endocrine Medical History: Positive for: Type II Diabetes Treated with: Insulin Blood sugar tested every day: Yes Tested : 2-3x a day Genitourinary Medical History: Positive for: End Stage Renal Disease Past Medical History Notes: Hemodiaylsis Immunological Medical History: Positive for: Raynauds Past Medical History Notes: 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 Neurologic Medical History: Positive for: Neuropathy Oncologic Immunizations Pneumococcal  Vaccine: Received Pneumococcal Vaccination: Yes Implantable Devices None Family and Social History Never smoker; Marital Status - Single; Alcohol Use: Rarely; Drug Use: No History; Caffeine Use: Rarely; Financial Concerns: No; Food, Clothing or Shelter Needs: No; Support System Lacking: No; Transportation Concerns: No Electronic Signature(s) Signed: 05/16/2019 5:55:18 PM By: Worthy Keeler PA-C Signed: 05/17/2019 8:56:01 AM By: Levan Hurst RN, BSN Entered By: Levan Hurst on 05/16/2019 10:07:44 -------------------------------------------------------------------------------- [place] Details Patient Name: Date of Service: [name] [name] 05/16/2019 Medical Record [id number] Patient Account Number: [id number] Date of Birth/Sex: Treating RN: [birth year] (53 y.o. Male) Baruch Gouty Primary Care Provider: [name] Other Clinician: Referring Provider: Treating Provider/Extender:Stone III,  Rhea Bleacher, Marylyn Ishihara in Treatment: 0 Diagnosis Coding ICD-10 Codes Code Description N48.5 Ulcer of penis N18.6 End stage renal disease Z99.2 Dependence on renal dialysis E11.622 Type 2 diabetes mellitus with other skin ulcer Facility Procedures CPT4 Code: [id number] Description: 99214 - WOUND CARE VISIT-LEV 4 EST PT Modifier: Quantity: 1 Physician Procedures CPT4 Code: [id number] Description: [id number] - WC PHYS LEVEL 4 - NEW PT ICD-10 Diagnosis Description N48.5 Ulcer of penis N18.6 End stage renal disease Z99.2 Dependence on renal dialysis E11.622 Type 2 diabetes mellitus with other skin ulcer Modifier: Quantity: 1 Electronic Signature(s) Signed: 05/16/2019 1:23:53 PM By: Worthy Keeler PA-C Entered By: Worthy Keeler on 05/16/2019 13:23:53

## 2019-05-17 NOTE — Progress Notes (Signed)
[name] [name], [name] [name] ([id number]) Visit Report for 05/16/2019 Abuse/Suicide Risk Screen Details Patient Name: Date of Service: [name] [name], [name] [name] 05/16/2019 9:00 AM Medical Record [id number] Patient Account Number: [id number] Date of Birth/Sex: [birth year] (53 y.o. Male) Treating RN: Levan Hurst Primary Care Nyjah Schwake: [name] Other Clinician: Referring Misty Rago: Treating Verbie Babic/Extender:Stone III, Rhea Bleacher, Marylyn Ishihara in Treatment: 0 Abuse/Suicide Risk Screen Items Answer ABUSE RISK SCREEN: Has anyone close to you tried to hurt or harm you recentlyo No Do you feel uncomfortable with anyone in your familyo No Has anyone forced you do things that you didnt want to doo No Electronic Signature(s) Signed: 05/17/2019 8:56:01 AM By: Levan Hurst RN, BSN Entered By: Levan Hurst on 05/16/2019 09:45:22 -------------------------------------------------------------------------------- Activities of Daily Living Details Patient Name: Date of Service: [name] [name], [name] [name] 05/16/2019 9:00 AM Medical Record [id number] Patient Account Number: [id number] Date of Birth/Sex: 1965-03-28 (53 y.o. Male) Treating RN: Levan Hurst Primary Care Attilio Zeitler: [name] Other Clinician: Referring Destenee Guerry: Treating Kaylynn Chamblin/Extender:Stone III, Rhea Bleacher, Marylyn Ishihara in Treatment: 0 Activities of Daily Living Items Answer Activities of Daily Living (Please select one for each item) Drive Automobile Completely Able Take Medications Completely Able Use Telephone Completely [place] for Appearance Completely Able Use Toilet Completely [place] / Shower Completely Able Dress Self Completely Able Feed Self Completely Able Walk Completely Able Get In / Out Bed Completely Able Housework Completely Able Prepare Meals Completely Able Handle Money Completely Able Shop for Self Completely Able Electronic Signature(s) Signed: 05/17/2019 8:56:01 AM By: Levan Hurst  RN, BSN Entered By: Levan Hurst on 05/16/2019 09:45:41 -------------------------------------------------------------------------------- Education Screening Details Patient Name: Date of Service: [name] [name] 05/16/2019 9:00 AM Medical Record [id number] Patient Account Number: [id number] Date of Birth/Sex: 1965/04/29 (53 y.o. Male) Treating RN: Levan Hurst Primary Care Javien Tesch: [name] Other Clinician: Referring Neleh Muldoon: Treating Kieana Livesay/Extender:Stone III, Rhea Bleacher, Marylyn Ishihara in Treatment: 0 Primary Learner Assessed: Patient Learning Preferences/Education Level/Primary Language Learning Preference: Explanation, Demonstration, Printed Material Highest Education Level: College or Above Preferred Language: English Cognitive Barrier Language Barrier: No Translator Needed: No Memory Deficit: No Emotional Barrier: No Cultural/Religious Beliefs Affecting Medical Care: No Physical Barrier Impaired Vision: No Impaired Hearing: No Decreased Hand dexterity: No Knowledge/Comprehension Knowledge Level: High Comprehension Level: High Ability to understand written High instructions: Ability to understand verbal High instructions: Motivation Anxiety Level: Calm Cooperation: Cooperative Education Importance: Acknowledges Need Interest in Health Problems: Asks Questions Perception: Coherent Willingness to Engage in Self- High Management Activities: Readiness to Engage in Self- High Management Activities: Electronic Signature(s) Signed: 05/17/2019 8:56:01 AM By: Levan Hurst RN, BSN Entered By: Levan Hurst on 05/16/2019 09:46:18 -------------------------------------------------------------------------------- Fall Risk Assessment Details Patient Name: Date of Service: [name] [name] 05/16/2019 9:00 AM Medical Record [id number] Patient Account Number: [id number] Date of Birth/Sex: September 01, 1965 (54 y.o. Male) Treating RN: Levan Hurst Primary Care Kazoua Gossen: [name] Other Clinician: Referring Cary Lothrop: Treating Kimmy Totten/Extender:Stone III, Rhea Bleacher, Marylyn Ishihara in Treatment: 0 Fall Risk Assessment Items Have you had 2 or more falls in the last 12 monthso 0 No Have you had any fall that resulted in injury in the last 12 monthso 0 Yes FALLS RISK SCREEN History of falling - immediate or within 3 months 0 No Secondary diagnosis (Do you have 2 or more medical diagnoseso) 15 Yes Ambulatory aid None/bed rest/wheelchair/nurse 0 No Crutches/cane/walker 15 Yes Furniture 0 No Intravenous therapy Access/Saline/Heparin Lock 0 No Weak (short steps with or without shuffle, stooped but able to lift head 0 No while walking, may seek  support from furniture) Impaired (short steps with shuffle, may have difficulty arising from chair, 0 No head down, impaired balance) Mental Status Oriented to own ability 0 Yes Overestimates or forgets limitations 0 No Risk Level: Medium Risk Score: 30 Electronic Signature(s) Signed: 05/17/2019 8:56:01 AM By: Levan Hurst RN, BSN Entered By: Levan Hurst on 05/16/2019 09:47:14 -------------------------------------------------------------------------------- Nutrition Risk Screening Details Patient Name: Date of Service: [name] [name] 05/16/2019 9:00 AM Medical Record [id number] Patient Account Number: [id number] Date of Birth/Sex: 07/26/1965 (54 y.o. Male) Treating RN: Levan Hurst Primary Care Olman Yono: [name] Other Clinician: Referring Davaun Quintela: Treating Marjoria Mancillas/Extender:Stone III, Rhea Bleacher, Marylyn Ishihara in Treatment: 0 Height (in): 65 Weight (lbs): 240 Body Mass Index (BMI): 39.9 Nutrition Risk Screening Items Score Screening NUTRITION RISK SCREEN: I have an illness or condition that made me change the kind and/or 2 Yes amount of food I eat I eat fewer than two meals per day 0 No I eat few fruits and vegetables, or milk  products 0 No I have three or more drinks of beer, liquor or wine almost every day 0 No I have tooth or mouth problems that make it hard for me to eat 0 No I don't always have enough money to buy the food I need 0 No I eat alone most of the time 0 No I take three or more different prescribed or over-the-counter drugs a day 1 Yes 0 No Without wanting to, I have lost or gained 10 pounds in the last six months I am not always physically able to shop, cook and/or feed myself 0 No Nutrition Protocols Good Risk Protocol Provide education on Moderate Risk Protocol 0 nutrition High Risk Proctocol Risk Level: Moderate Risk Score: 3 Electronic Signature(s) Signed: 05/17/2019 8:56:01 AM By: Levan Hurst RN, BSN Entered By: Levan Hurst on 05/16/2019 09:47:27

## 2019-05-18 DIAGNOSIS — Z79891 Long term (current) use of opiate analgesic: Secondary | ICD-10-CM | POA: Diagnosis not present

## 2019-05-18 DIAGNOSIS — Z79899 Other long term (current) drug therapy: Secondary | ICD-10-CM | POA: Diagnosis not present

## 2019-05-18 DIAGNOSIS — M2550 Pain in unspecified joint: Secondary | ICD-10-CM | POA: Diagnosis not present

## 2019-05-18 DIAGNOSIS — G894 Chronic pain syndrome: Secondary | ICD-10-CM | POA: Diagnosis not present

## 2019-05-18 DIAGNOSIS — S68119A Complete traumatic metacarpophalangeal amputation of unspecified finger, initial encounter: Secondary | ICD-10-CM | POA: Diagnosis not present

## 2019-05-18 DIAGNOSIS — M792 Neuralgia and neuritis, unspecified: Secondary | ICD-10-CM | POA: Diagnosis not present

## 2019-05-19 DIAGNOSIS — N186 End stage renal disease: Secondary | ICD-10-CM | POA: Diagnosis not present

## 2019-05-19 DIAGNOSIS — D631 Anemia in chronic kidney disease: Secondary | ICD-10-CM | POA: Diagnosis not present

## 2019-05-19 DIAGNOSIS — D509 Iron deficiency anemia, unspecified: Secondary | ICD-10-CM | POA: Diagnosis not present

## 2019-05-19 DIAGNOSIS — N2581 Secondary hyperparathyroidism of renal origin: Secondary | ICD-10-CM | POA: Diagnosis not present

## 2019-05-20 DIAGNOSIS — N186 End stage renal disease: Secondary | ICD-10-CM | POA: Diagnosis not present

## 2019-05-20 DIAGNOSIS — Z992 Dependence on renal dialysis: Secondary | ICD-10-CM | POA: Diagnosis not present

## 2019-05-22 DIAGNOSIS — D509 Iron deficiency anemia, unspecified: Secondary | ICD-10-CM | POA: Diagnosis not present

## 2019-05-22 DIAGNOSIS — N2581 Secondary hyperparathyroidism of renal origin: Secondary | ICD-10-CM | POA: Diagnosis not present

## 2019-05-22 DIAGNOSIS — Z79891 Long term (current) use of opiate analgesic: Secondary | ICD-10-CM | POA: Diagnosis not present

## 2019-05-22 DIAGNOSIS — N186 End stage renal disease: Secondary | ICD-10-CM | POA: Diagnosis not present

## 2019-05-22 DIAGNOSIS — D631 Anemia in chronic kidney disease: Secondary | ICD-10-CM | POA: Diagnosis not present

## 2019-05-22 NOTE — Progress Notes (Signed)
[name] [name], [name] [name] ([id number]) Visit Report for 05/16/2019 Allergy List Details Patient Name: Date of Service: [name] [name], [name] [name] 05/16/2019 9:00 AM Medical Record [id number] Patient Account Number: [id number] Date of Birth/Sex: 1965/04/12 (53 y.o. Male) Treating RN: Levan Hurst Primary Care Benjy Kana: [name] Other Clinician: Referring Camy Leder: Treating Christie Copley/Extender:Stone III, Rhea Bleacher, Marylyn Ishihara in Treatment: 0 Allergies Active Allergies No Known Drug Allergies Allergy Notes Electronic Signature(s) Signed: 05/17/2019 8:56:01 AM By: Levan Hurst RN, BSN Entered By: Levan Hurst on 05/16/2019 09:33:59 -------------------------------------------------------------------------------- Arrival Information Details Patient Name: Date of Service: [name] [name] 05/16/2019 9:00 AM Medical Record [id number] Patient Account Number: [id number] Date of Birth/Sex: 1966-01-10 (53 y.o. Male) Treating RN: Levan Hurst Primary Care Sutter Ahlgren: [name] Other Clinician: Referring Hannah Crill: Treating Ami Mally/Extender:Stone III, Rhea Bleacher, Marylyn Ishihara in Treatment: 0 Visit Information Patient Arrived: [name] [name] Arrival Time: 09:26 Accompanied By: alone Transfer Assistance: None Patient Identification Verified: Yes Secondary Verification Process Completed: Yes Patient Requires Transmission-Based No Precautions: Patient Has Alerts: No Electronic Signature(s) Signed: 05/17/2019 8:56:01 AM By: Levan Hurst RN, BSN Entered By: Levan Hurst on 05/16/2019 09:32:24 -------------------------------------------------------------------------------- Clinic Level of Care Assessment Details Patient Name: Date of Service: [name] [name], [name] [name] 05/16/2019 9:00 AM Medical Record [id number] Patient Account Number: [id number] Date of Birth/Sex: May 25, 1965 (53 y.o. Male) Treating RN: Baruch Gouty Primary Care Marte Celani: [name] Other  Clinician: Referring Jacqlyn Marolf: Treating Jarian Longoria/Extender:Stone III, Rhea Bleacher, Marylyn Ishihara in Treatment: 0 Clinic Level of Care Assessment Items TOOL 2 Quantity Score []  - Use when only an EandM is performed on the INITIAL visit 0 ASSESSMENTS - Nursing Assessment / Reassessment X - General Physical Exam (combine w/ comprehensive assessment (listed just below) 1 20 when performed on new pt. evals) X - Comprehensive Assessment (HX, ROS, Risk Assessments, Wounds Hx, etc.) 1 25 ASSESSMENTS - Wound and Skin Assessment / Reassessment X - Simple Wound Assessment / Reassessment - one wound 1 5 []  - Complex Wound Assessment / Reassessment - multiple wounds 0 []  - Dermatologic / Skin Assessment (not related to wound area) 0 ASSESSMENTS - Ostomy and/or Continence Assessment and Care []  - Incontinence Assessment and Management 0 []  - Ostomy Care Assessment and Management (repouching, etc.) 0 PROCESS - Coordination of Care X - Simple Patient / Family Education for ongoing care 1 15 []  - Complex (extensive) Patient / Family Education for ongoing care 0 X - Staff obtains Consents, Records, Test Results / Process Orders 1 10 []  - Staff telephones HHA, Nursing Homes / Clarify orders / etc 0 []  - Routine Transfer to another Facility (non-emergent condition) 0 []  - Routine Hospital Admission (non-emergent condition) 0 X - New Admissions / Biomedical engineer / Ordering NPWT, Apligraf, etc. 1 15 []  - Emergency Hospital Admission (emergent condition) 0 X - Simple Discharge Coordination 1 10 []  - Complex (extensive) Discharge Coordination 0 PROCESS - Special Needs []  - Pediatric / Minor Patient Management 0 []  - Isolation Patient Management 0 []  - Hearing / Language / Visual special needs 0 []  - Assessment of Community assistance (transportation, D/C planning, etc.) 0 []  - Additional assistance / Altered mentation 0 []  - Support Surface(s) Assessment (bed, cushion, seat, etc.)  0 INTERVENTIONS - Wound Cleansing / Measurement X - Wound Imaging (photographs - any number of wounds) 1 5 []  - Wound Tracing (instead of photographs) 0 X - Simple Wound Measurement - one wound 1 5 []  - Complex Wound Measurement - multiple wounds 0 X - Simple Wound Cleansing - one wound 1 5 []  - Complex  Wound Cleansing - multiple wounds 0 INTERVENTIONS - Wound Dressings X - Small Wound Dressing one or multiple wounds 1 10 []  - Medium Wound Dressing one or multiple wounds 0 []  - Large Wound Dressing one or multiple wounds 0 []  - Application of Medications - injection 0 INTERVENTIONS - Miscellaneous []  - External ear exam 0 []  - Specimen Collection (cultures, biopsies, blood, body fluids, etc.) 0 []  - Specimen(s) / Culture(s) sent or taken to Lab for analysis 0 []  - Patient Transfer (multiple staff / Harrel Lemon Lift / Similar devices) 0 []  - Simple Staple / Suture removal (25 or less) 0 []  - Complex Staple / Suture removal (26 or more) 0 []  - Hypo / Hyperglycemic Management (close monitor of Blood Glucose) 0 []  - Ankle / Brachial Index (ABI) - do not check if billed separately 0 Has the patient been seen at the hospital within the last three years: Yes Total Score: 125 Level Of Care: New/Established - Level 4 Electronic Signature(s) Signed: 05/16/2019 6:08:21 PM By: Baruch Gouty RN, BSN Entered By: Baruch Gouty on 05/16/2019 10:43:46 -------------------------------------------------------------------------------- Encounter Discharge Information Details Patient Name: Date of Service: [name] [name] 05/16/2019 9:00 AM Medical Record [id number] Patient Account Number: [id number] Date of Birth/Sex: Oct 30, 1965 (53 y.o. Male) Treating RN: Carlene Coria Primary Care Munirah Doerner: [name] Other Clinician: Referring Ngoc Daughtridge: Treating Anthonio Mizzell/Extender:Stone III, Rhea Bleacher, Marylyn Ishihara in Treatment: 0 Encounter Discharge Information Items Discharge Condition:  Stable Ambulatory Status: Ambulatory Discharge Destination: Home Transportation: Private Auto Accompanied By: self Schedule Follow-up Appointment: Yes Clinical Summary of Care: Patient Declined Electronic Signature(s) Signed: 05/22/2019 9:14:02 AM By: Carlene Coria RN Entered By: Carlene Coria on 05/16/2019 11:05:07 -------------------------------------------------------------------------------- [place] Details Patient Name: Date of Service: [name] [name] 05/16/2019 9:00 AM Medical Record [id number] Patient Account Number: [id number] Date of Birth/Sex: 1965/12/13 (53 y.o. Male) Treating RN: Baruch Gouty Primary Care Allure Greaser: [name] Other Clinician: Referring Aizley Stenseth: Treating Ahmir Bracken/Extender:Stone III, Rhea Bleacher, Marylyn Ishihara in Treatment: 0 Active Inactive Abuse / Safety / Falls / Self Care Management Nursing Diagnoses: Potential for falls Goals: Patient/caregiver will verbalize/demonstrate measures taken to prevent injury and/or falls Date Initiated: 05/16/2019 Target Resolution Date: 06/13/2019 Goal Status: Active Interventions: Assess fall risk on admission and as needed Notes: Nutrition Nursing Diagnoses: Impaired glucose control: actual or potential Goals: Patient/caregiver will maintain therapeutic glucose control Date Initiated: 05/16/2019 Target Resolution Date: 06/13/2019 Goal Status: Active Interventions: Assess patient nutrition upon admission and as needed per policy Treatment Activities: Patient referred to Primary Care Physician for further nutritional evaluation : 05/16/2019 Notes: Wound/Skin Impairment Nursing Diagnoses: Impaired tissue integrity Knowledge deficit related to ulceration/compromised skin integrity Goals: Patient/caregiver will verbalize understanding of skin care regimen Date Initiated: 05/16/2019 Target Resolution Date: 06/13/2019 Goal Status: Active Ulcer/skin breakdown will have a  volume reduction of 30% by week 4 Date Initiated: 05/16/2019 Target Resolution Date: 05/16/2019 Goal Status: Active Interventions: Assess patient/caregiver ability to obtain necessary supplies Assess patient/caregiver ability to perform ulcer/skin care regimen upon admission and as needed Assess ulceration(s) every visit Provide education on ulcer and skin care Treatment Activities: Skin care regimen initiated : 05/16/2019 Topical wound management initiated : 05/16/2019 Notes: Electronic Signature(s) Signed: 05/16/2019 6:08:21 PM By: Baruch Gouty RN, BSN Entered By: Baruch Gouty on 05/16/2019 10:34:15 -------------------------------------------------------------------------------- Pain Assessment Details Patient Name: Date of Service: [name] [name] 05/16/2019 9:00 AM Medical Record [id number] Patient Account Number: [id number] Date of Birth/Sex: [birth year] (53 y.o. Male) Treating RN: Levan Hurst Primary Care Fulton Merry: [name] Other Clinician: Referring  Twilia Yaklin: Treating Paula Busenbark/Extender:Stone III, Rhea Bleacher, Marylyn Ishihara in Treatment: 0 Active Problems Location of Pain Severity and Description of Pain Patient Has Paino Yes Site Locations Pain Location: Pain in Ulcers With Dressing Change: Yes Rate the pain. Current Pain Level: 5 Worst Pain Level: 10 Least Pain Level: 0 Character of Pain Describe the Pain: Burning Pain Management and Medication Current Pain Management: Medication: No Cold Application: No Rest: No Massage: No Activity: No T.E.N.S.: No Heat Application: No Leg drop or elevation: No Is the Current Pain Management Adequate: Adequate How does your wound impact your activities of daily livingo Sleep: No Bathing: No Appetite: No Relationship With Others: No Bladder Continence: No Emotions: No Bowel Continence: No Work: No Toileting: No Drive: No Dressing: No Hobbies: No Electronic Signature(s) Signed: 05/17/2019  8:56:01 AM By: Levan Hurst RN, BSN Entered By: Levan Hurst on 05/16/2019 09:49:46 -------------------------------------------------------------------------------- Patient/Caregiver Education Details Patient Name: [name] [name] 2/24/2021andnbsp9:00 Date of Service: AM Medical Record [id number] Number: Patient Account Number: [id number] Treating RN: [birth year] (54 y.o. Baruch Gouty Date of Birth/Gender: Male) Other Clinician: Primary Care Treating Kilpatrick, Lyda Jester Physician: Physician/Extender: Referring Physician: Ashley Jacobs in Treatment: 0 Education Assessment Education Provided To: Patient Education Topics Provided Infection: Methods: Explain/Verbal Responses: Reinforcements needed, State content correctly Wound/Skin Impairment: Methods: Explain/Verbal Responses: Reinforcements needed, State content correctly Electronic Signature(s) Signed: 05/16/2019 6:08:21 PM By: Baruch Gouty RN, BSN Entered By: Baruch Gouty on 05/16/2019 10:34:36 -------------------------------------------------------------------------------- Wound Assessment Details Patient Name: Date of Service: [name] [name] 05/16/2019 9:00 AM Medical Record [id number] Patient Account Number: [id number] Date of Birth/Sex: Aug 25, 1965 (53 y.o. Male) Treating RN: Baruch Gouty Primary Care Sakina Briones: [name] Other Clinician: Referring Sophiamarie Nease: Treating Nikiah Goin/Extender:Stone III, Rhea Bleacher, Marylyn Ishihara in Treatment: 0 Wound Status Wound Number: 1 Primary Atypical Etiology: Wound Location: Penis Wound Open Wounding Event: Gradually Appeared Status: Date Acquired: 01/21/2019 Comorbid Anemia, Congestive Heart Failure, Weeks Of Treatment: 0 History: Hypertension, Type II Diabetes, End Stage Clustered Wound: No Clustered Wound: No Renal Disease, Raynauds, Neuropathy Photos Wound Measurements Length: (cm) 2.5 % Reduct Width: (cm) 2.5  % Reduct Depth: (cm) 0.2 Epitheli Area: (cm) 4.909 Tunneli Volume: (cm) 0.982 Undermi Wound Description Full Thickness Without Exposed Support Foul Od Classification: Structures Slough/ Wound Flat and Intact Margin: Exudate Medium Amount: Exudate Serosanguineous Type: Exudate red, brown Color: Wound Bed Granulation Amount: Large (67-100%) Granulation Quality: Pink, Pale Fascia E Necrotic Amount: Small (1-33%) Fat Laye Necrotic Quality: Adherent Slough Tendon E Muscle E Joint Ex Bone Exp or After Cleansing: No Fibrino Yes Exposed Structure xposed: No r (Subcutaneous Tissue) Exposed: Yes xposed: No xposed: No posed: No osed: No ion in Area: 0% ion in Volume: 0% alization: None ng: No ning: No Treatment Notes Wound #1 (Penis) 1. Cleanse With Wound Cleanser 3. Primary Dressing Applied Santyl Other primary dressing (specifiy in notes) 4. Secondary Dressing Dry Gauze Notes normal saline moist gauze for wet to dry Electronic Signature(s) Signed: 05/16/2019 5:09:01 PM By: Mikeal Hawthorne EMT/HBOT Signed: 05/16/2019 6:08:21 PM By: Baruch Gouty RN, BSN Entered By: Mikeal Hawthorne on 05/16/2019 13:46:38 -------------------------------------------------------------------------------- Vitals Details Patient Name: Date of Service: [name] [name] 05/16/2019 9:00 AM Medical Record [id number] Patient Account Number: [id number] Date of Birth/Sex: November 21, 1965 (53 y.o. Male) Treating RN: Levan Hurst Primary Care Verdie Barrows: [name] Other Clinician: Referring Careem Yasui: Treating Emmelina Mcloughlin/Extender:Stone III, Rhea Bleacher, Marylyn Ishihara in Treatment: 0 Vital Signs Time Taken: 09:32 Temperature (F): 98.5 Height (in): 65 Pulse (bpm): 102 Source: Stated Respiratory Rate (breaths/min): 18  Weight (lbs): 240 Blood Pressure (mmHg): 106/56 Source: Stated Capillary Blood Glucose (mg/dl): 189 Body Mass Index (BMI): 39.9 Reference Range: 80 - 120 mg /  dl Notes glucose per pt report Electronic Signature(s) Signed: 05/17/2019 8:56:01 AM By: Levan Hurst RN, BSN Entered By: Levan Hurst on 05/16/2019 09:36:21

## 2019-05-23 ENCOUNTER — Other Ambulatory Visit: Payer: Self-pay

## 2019-05-23 ENCOUNTER — Encounter (HOSPITAL_BASED_OUTPATIENT_CLINIC_OR_DEPARTMENT_OTHER): Payer: Medicare Other | Attending: Physician Assistant | Admitting: Physician Assistant

## 2019-05-23 DIAGNOSIS — N186 End stage renal disease: Secondary | ICD-10-CM | POA: Insufficient documentation

## 2019-05-23 DIAGNOSIS — I7300 Raynaud's syndrome without gangrene: Secondary | ICD-10-CM | POA: Insufficient documentation

## 2019-05-23 DIAGNOSIS — E11622 Type 2 diabetes mellitus with other skin ulcer: Secondary | ICD-10-CM | POA: Diagnosis not present

## 2019-05-23 DIAGNOSIS — E1140 Type 2 diabetes mellitus with diabetic neuropathy, unspecified: Secondary | ICD-10-CM | POA: Diagnosis not present

## 2019-05-23 DIAGNOSIS — I120 Hypertensive chronic kidney disease with stage 5 chronic kidney disease or end stage renal disease: Secondary | ICD-10-CM | POA: Insufficient documentation

## 2019-05-23 DIAGNOSIS — N485 Ulcer of penis: Secondary | ICD-10-CM | POA: Diagnosis not present

## 2019-05-23 DIAGNOSIS — L98492 Non-pressure chronic ulcer of skin of other sites with fat layer exposed: Secondary | ICD-10-CM | POA: Diagnosis not present

## 2019-05-23 DIAGNOSIS — Z992 Dependence on renal dialysis: Secondary | ICD-10-CM | POA: Insufficient documentation

## 2019-05-23 DIAGNOSIS — E1122 Type 2 diabetes mellitus with diabetic chronic kidney disease: Secondary | ICD-10-CM | POA: Diagnosis not present

## 2019-05-23 NOTE — Progress Notes (Addendum)
NAVIAN, AGUILA ([id number]) Visit Report for 05/23/2019 Chief Complaint Document Details Patient Name: Date of Service: [name], [name] 05/23/2019 10:00 AM Medical Record [id number] Patient Account Number: [id number] Date of Birth/Sex: Treating RN: Jul 07, 1965 (54 y.o. [name] [name] Primary Care Provider: [name] Other Clinician: Referring Provider: Treating Provider/Extender:Stone III, Rhea Bleacher, Marylyn Ishihara in Treatment: 1 Information Obtained from: Patient Chief Complaint Penis Ulcer Electronic Signature(s) Signed: 05/23/2019 10:44:30 AM By: Worthy Keeler PA-C Entered By: Worthy Keeler on 05/23/2019 10:44:29 -------------------------------------------------------------------------------- Debridement Details Patient Name: Date of Service: [name] 05/23/2019 10:00 AM Medical Record [id number] Patient Account Number: [id number] Date of Birth/Sex: 11-17-1965 (53 y.o. M) Treating RN: Levan Hurst Primary Care Provider: [name] Other Clinician: Referring Provider: Treating Provider/Extender:Stone III, Rhea Bleacher, Marylyn Ishihara in Treatment: 1 Debridement Performed for Wound #1 Penis Assessment: Performed By: Physician Worthy Keeler, PA Debridement Type: Chemical/Enzymatic/Mechanical Agent Used: Santyl Level of Consciousness (Pre- Awake and Alert procedure): Pre-procedure Verification/Time Out Taken: Yes - 11:35 Start Time: 11:35 Bleeding: None End Time: 11:35 Procedural Pain: 0 Post Procedural Pain: 0 Response to Treatment: Procedure was tolerated well Level of Consciousness Awake and Alert (Post-procedure): Post Debridement Measurements of Total Wound Length: (cm) 2 Width: (cm) 2 Depth: (cm) 0.1 Volume: (cm) 0.314 Character of Wound/Ulcer Post Improved Debridement: Post Procedure Diagnosis Same as Pre-procedure Electronic Signature(s) Signed: 05/23/2019 4:54:37 PM By: Worthy Keeler PA-C Signed:  05/23/2019 5:54:02 PM By: Levan Hurst RN, BSN Entered By: Levan Hurst on 05/23/2019 11:34:43 -------------------------------------------------------------------------------- HPI Details Patient Name: Date of Service: [name]. 05/23/2019 10:00 AM Medical Record [id number] Patient Account Number: [id number] Date of Birth/Sex: Treating RN: 1965-09-21 (54 y.o. [name] [name] Primary Care Provider: [name] Other Clinician: Referring Provider: Treating Provider/Extender:Stone III, Rhea Bleacher, Marylyn Ishihara in Treatment: 1 History of Present Illness HPI Description: 05/16/2019 patient presents today for initial evaluation here in our clinic concerning an issue he has been having with a wound on the dorsal surface of the distal portion of his penis. This has been present he tells me for about 2 months I do have a note from the urologist which actually states 4 months. 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 With that being said the patient has also been seen by his primary care provider he tells [place] that he has been screened for sexually transmitted diseases including syphilis although we did contact his primary care provider as well who states that they did not have any testing on him as such. Urology also did not perform any testing. The patient tells me he has not been sexually active since 2014 that he has not had any ability to do so. He is on dialysis and unfortunately is having a lot of issues as far as sexual dysfunction is concerned. 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 He tells me that the ulcer came out first as a bump on the end of his penis which was tender to touch. He continue to watch this and unfortunately as things progressed  he states that it eventually opened, ulcerated, and has been extremely painful. He really does not urinate secondary to being on dialysis and his end- stage renal disease. He does have diabetes mellitus type 2. 05/23/2019 upon evaluation today patient's wound  on his penis actually appears to be doing better compared to last week's evaluation. Unfortunately he still is having significant pain but there is really not necrotic tissue noted as badly as it was at the last visit. Overall I am pleased in this regard. We still have not been able to get the result back from the RPR testing that he had done at [place]. We have called them throughout the day today and we have not been able to get to talk with anyone yet. Obviously as soon as we get the results of this we will have to likely give them a call not to make him stay here all day. Electronic Signature(s) Signed: 05/23/2019 1:43:10 PM By: Worthy Keeler PA-C Entered By: Worthy Keeler on 05/23/2019 13:43:10 -------------------------------------------------------------------------------- Physical Exam Details Patient Name: Date of Service: [name], [name] 05/23/2019 10:00 AM Medical Record [id number] Patient Account Number: [id number] Date of Birth/Sex: Treating RN: [birth year] (54 y.o. [name] [name] Primary Care Provider: [name] Other Clinician: Referring Provider: Treating Provider/Extender:Stone III, Rhea Bleacher, Marylyn Ishihara in Treatment: 1 Constitutional Well-nourished and well-hydrated in no acute distress. Respiratory normal breathing without difficulty. Psychiatric this patient is able to make decisions and demonstrates good insight into disease process. Alert and Oriented x 3. pleasant and cooperative. Notes Patient's wound bed currently again showed some signs of minimal slough this was greatly improved compared to last week's evaluation of actually very pleased in this regard. With that being said still  this has some ways to go as far as healing is concerned. Electronic Signature(s) Signed: 05/23/2019 1:43:26 PM By: Worthy Keeler PA-C Entered By: Worthy Keeler on 05/23/2019 13:43:25 -------------------------------------------------------------------------------- Physician Orders Details Patient Name: Date of Service: HENRI, NODARSE 05/23/2019 10:00 AM Medical Record [id number] Patient Account Number: [id number] Date of Birth/Sex: Treating RN: 08/10/1965 (54 y.o. [name] [name] Primary Care Provider: [name] Other Clinician: Referring Provider: Treating Provider/Extender:Stone III, Rhea Bleacher, Marylyn Ishihara in Treatment: 1 Verbal / Phone Orders: No Diagnosis Coding ICD-10 Coding Code Description N48.5 Ulcer of penis N18.6 End stage renal disease Z99.2 Dependence on renal dialysis E11.622 Type 2 diabetes mellitus with other skin ulcer Follow-up Appointments Return Appointment in 1 week. Dressing Change Frequency Wound #1 Penis Change dressing every day. Wound Cleansing Wound #1 Penis May shower and wash wound with soap and water. Primary Wound Dressing Wound #1 Penis Santyl Ointment Secondary Dressing Wound #1 Penis Kerlix/Rolled Gauze Other: - saline moistened gauze over santyl Electronic Signature(s) Signed: 05/23/2019 4:54:37 PM By: Worthy Keeler PA-C Signed: 05/23/2019 5:54:02 PM By: Levan Hurst RN, BSN Entered By: Levan Hurst on 05/23/2019 11:33:47 -------------------------------------------------------------------------------- Problem List Details Patient Name: Date of Service: [name]. 05/23/2019 10:00 AM Medical Record [id number] Patient Account Number: [id number] Date of Birth/Sex: Treating RN: 1965-08-04 (54 y.o. [name] [name] Primary Care Provider: [name] Other Clinician: Referring Provider: Treating Provider/Extender:Stone III, Rhea Bleacher, Marylyn Ishihara in Treatment: 1 Active  Problems ICD-10 Evaluated Encounter Code Description Active Date Today Diagnosis N48.5 Ulcer of penis 05/16/2019 No Yes N18.6 End stage renal disease 05/16/2019 No Yes Z99.2 Dependence on renal dialysis 05/16/2019 No Yes E11.622 Type 2 diabetes mellitus with other skin ulcer 05/16/2019 No Yes Inactive Problems Resolved Problems Electronic Signature(s) Signed: 05/23/2019 10:44:24 AM By: Worthy Keeler PA-C Entered By: Worthy Keeler on 05/23/2019 10:44:23 -------------------------------------------------------------------------------- Progress Note Details Patient Name: Date of Service: [name]. 05/23/2019 10:00 AM Medical Record [id number] Patient Account Number: [id number] Date of Birth/Sex: Treating  RN: May 04, 1965 (54 y.o. [name] [name] Primary Care Provider: [name] Other Clinician: Referring Provider: Treating Provider/Extender:Stone III, Rhea Bleacher, Marylyn Ishihara in Treatment: 1 Subjective Chief Complaint Information obtained from Patient Penis Ulcer History of Present Illness (HPI) 05/16/2019 patient presents today for initial evaluation here in our clinic concerning an issue he has been having with a wound on the dorsal surface of the distal portion of his penis. This has been present he tells me for about 2 months I do have a note from the urologist which actually states 4 months. 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 With that being said the patient has also been seen by his primary care provider he tells [place] that he has been screened for sexually transmitted diseases including syphilis although we did contact his primary care provider as well who states that they did not have any testing on him as such. Urology also did not perform any testing. The patient tells me he has not been sexually active since 2014 that he has not had any  ability to do so. He is on dialysis and unfortunately is having a lot of issues as far as sexual dysfunction is concerned. 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 He tells me that the ulcer came out first as a bump on the end of his penis which was tender to touch. He continue to watch this and unfortunately as things progressed he states that it eventually opened, ulcerated, and has been extremely painful. He really does not urinate secondary to being on dialysis and his end-stage renal disease. He does have diabetes mellitus type 2. 05/23/2019 upon evaluation today patient's wound on his penis actually appears to be doing better compared to last week's evaluation. Unfortunately he still is having significant pain but there is really not necrotic tissue noted as badly as it was at the last visit. Overall I am pleased in this regard. We still have not been able to get the result back from the RPR testing that he had done at [place]. We have called them throughout the day today and we have not been able to get to talk with anyone yet. Obviously as soon as we get the results of this we will have to likely give them a call not to make him stay here all day. Objective Constitutional Well-nourished and well-hydrated in no acute distress. Vitals Time Taken: 10:40 AM, Height: 65 in, Weight: 240 lbs, BMI: 39.9, Temperature: 98.5 F, Pulse: 101 bpm, Respiratory Rate: 18 breaths/min, Blood Pressure: 108/61 mmHg, Capillary Blood Glucose: 182 mg/dl. Respiratory normal breathing without difficulty. Psychiatric this patient is able to make decisions and demonstrates good insight into disease process. Alert and Oriented x 3. pleasant and cooperative. General Notes: Patient's wound bed currently again showed some signs of minimal slough this was greatly improved compared to last week's evaluation of  actually very pleased in this regard. With that being said still this has some ways to go as far as healing is concerned. Integumentary (Hair, Skin) Wound #1 status is Open. Original cause of wound was Gradually Appeared. The wound is located on the Penis. The wound measures 2cm length x 2cm width x 0.1cm depth; 3.142cm^2 area and 0.314cm^3 volume. There is Fat Layer (Subcutaneous Tissue) Exposed exposed. There is  no tunneling or undermining noted. There is a medium amount of serosanguineous drainage noted. The wound margin is flat and intact. There is large (67-100%) pink, pale granulation within the wound bed. There is a small (1-33%) amount of necrotic tissue within the wound bed including Adherent Slough. General Notes: macerated periwound. Assessment Active Problems ICD-10 Ulcer of penis End stage renal disease Dependence on renal dialysis Type 2 diabetes mellitus with other skin ulcer Procedures Wound #1 Pre-procedure diagnosis of Wound #1 is an Atypical located on the Penis . There was a Chemical/Enzymatic/Mechanical debridement performed by Worthy Keeler, PA.[name] Agent used was [employer]. A time out was conducted at 11:35, prior to the start of the procedure. There was no bleeding. The procedure was tolerated well with a pain level of 0 throughout and a pain level of 0 following the procedure. Post Debridement Measurements: 2cm length x 2cm width x 0.1cm depth; 0.314cm^3 volume. Character of Wound/Ulcer Post Debridement is improved. Post procedure Diagnosis Wound #1: Same as Pre-Procedure Plan Follow-up Appointments: Return Appointment in 1 week. Dressing Change Frequency: Wound #1 Penis: Change dressing every day. Wound Cleansing: Wound #1 Penis: May shower and wash wound with soap and water. Primary Wound Dressing: Wound #1 Penis: Santyl Ointment Secondary Dressing: Wound #1 Penis: Kerlix/Rolled Gauze Other: - saline moistened gauze over santyl 1. I do believe the  Santyl has been beneficial for the patient and I Georgina Peer suggest as such that we continue with the Santyl going forward. 2. I am also going to suggest at this point that we go ahead and continue to have him change this daily obviously I think keeping the area clean with the use of the Santyl is probably the best treatment choice at this point. I do feel like he shown signs of improvement this makes me less concerned about the possibility of anything cancer-like in nature. 3. We will still waiting on the results of the RPR testing although again this may simply be some other type of wound nonetheless I am unsure of exactly what caused this initially we will see what the results of the test show once we finally get a hold of that. We will see patient back for reevaluation in 1 week here in the clinic. If anything worsens or changes patient will contact our office for additional recommendations. Electronic Signature(s) Signed: 05/23/2019 1:44:58 PM By: Worthy Keeler PA-C Entered By: Worthy Keeler on 05/23/2019 13:44:58 -------------------------------------------------------------------------------- SuperBill Details Patient Name: Date of Service: [name] 05/23/2019 Medical Record [id number] Patient Account Number: [id number] Date of Birth/Sex: Treating RN: [birth year] (54 y.o. [name] [name] Primary Care Provider: [name] Other Clinician: Referring Provider: Treating Provider/Extender:Stone III, Rhea Bleacher, Marylyn Ishihara in Treatment: 1 Diagnosis Coding ICD-10 Codes Code Description N48.5 Ulcer of penis N18.6 End stage renal disease Z99.2 Dependence on renal dialysis E11.622 Type 2 diabetes mellitus with other skin ulcer Facility Procedures CPT4 Code: [id number] Description: [id number] - DEBRIDE W/O ANES NON SELECT Modifier: Quantity: 1 Physician Procedures CPT4 Code: [id number] Description: [id number] - WC PHYS LEVEL 3 - EST PT ICD-10 Diagnosis Description N48.5  Ulcer of penis N18.6 End stage renal disease Z99.2 Dependence on renal dialysis E11.622 Type 2 diabetes mellitus with other skin ulcer Modifier: Quantity: 1 Electronic Signature(s) Signed: 05/23/2019 1:45:18 PM By: Worthy Keeler PA-C Entered By: Worthy Keeler on 05/23/2019 13:45:17

## 2019-05-23 NOTE — Progress Notes (Addendum)
GRZEGORZ, HAMOR ([id number]) Visit Report for 05/23/2019 Arrival Information Details Patient Name: Date of Service: ADRIN, BOURDON 05/23/2019 10:00 AM Medical Record [id number] Patient Account Number: [id number] Date of Birth/Sex: Treating RN: 1966/03/11 (54 y.o. Lorette Ang, Meta.Reding Primary Care Martice Doty: [name] Other Clinician: Referring Nussen Pullin: Treating Autrey Human/Extender:Stone III, Rhea Bleacher, Marylyn Ishihara in Treatment: 1 Visit Information History Since Last Visit Cane Added or deleted any medications: No Patient Arrived: 10:41 Any new allergies or adverse reactions: No Arrival Time: Had a fall or experienced change in No Accompanied By: self None activities of daily living that may affect Transfer Assistance: risk of falls: Patient Identification Verified: Yes Signs or symptoms of abuse/neglect since last No Secondary Verification Process Completed: Yes visito Patient Requires Transmission-Based No Hospitalized since last visit: No Precautions: Implantable device outside of the clinic excluding No Patient Has Alerts: No cellular tissue based products placed in the center since last visit: Has Dressing in Place as Prescribed: Yes Pain Present Now: Yes Electronic Signature(s) Signed: 05/23/2019 5:43:51 PM By: Deon Pilling Entered By: Deon Pilling on 05/23/2019 10:41:35 -------------------------------------------------------------------------------- Clinic Level of Care Assessment Details Patient Name: Date of Service: SUKHDEEP, SANZONE 05/23/2019 10:00 AM Medical Record [id number] Patient Account Number: [id number] Date of Birth/Sex: Treating RN: 08-07-1965 (53 y.o. [name] Primary Care Achsah Mcquade: [name] Other Clinician: Referring Kenslei Hearty: Treating Lanaya Bennis/Extender:Stone III, Rhea Bleacher, Marylyn Ishihara in Treatment: 1 Clinic Level of Care Assessment Items TOOL 4 Quantity Score X - Use when only an EandM is  performed on FOLLOW-UP visit 1 0 ASSESSMENTS - Nursing Assessment / Reassessment X - Reassessment of Co-morbidities (includes updates in patient status) 1 10 X - Reassessment of Adherence to Treatment Plan 1 5 ASSESSMENTS - Wound and Skin Assessment / Reassessment X - Simple Wound Assessment / Reassessment - one wound 1 5 []  - Complex Wound Assessment / Reassessment - multiple wounds 0 []  - Dermatologic / Skin Assessment (not related to wound area) 0 ASSESSMENTS - Focused Assessment []  - Circumferential Edema Measurements - multi extremities 0 []  - Nutritional Assessment / Counseling / Intervention 0 []  - Lower Extremity Assessment (monofilament, tuning fork, pulses) 0 []  - Peripheral Arterial Disease Assessment (using hand held doppler) 0 ASSESSMENTS - Ostomy and/or Continence Assessment and Care []  - Incontinence Assessment and Management 0 []  - Ostomy Care Assessment and Management (repouching, etc.) 0 PROCESS - Coordination of Care X - Simple Patient / Family Education for ongoing care 1 15 []  - Complex (extensive) Patient / Family Education for ongoing care 0 X - Staff obtains Programmer, systems, Records, Test Results / Process Orders 1 10 []  - Staff telephones HHA, Nursing Homes / Clarify orders / etc 0 []  - Routine Transfer to another Facility (non-emergent condition) 0 []  - Routine Hospital Admission (non-emergent condition) 0 []  - New Admissions / Biomedical engineer / Ordering NPWT, Apligraf, etc. 0 []  - Emergency Hospital Admission (emergent condition) 0 X - Simple Discharge Coordination 1 10 []  - Complex (extensive) Discharge Coordination 0 PROCESS - Special Needs []  - Pediatric / Minor Patient Management 0 []  - Isolation Patient Management 0 []  - Hearing / Language / Visual special needs 0 []  - Assessment of Community assistance (transportation, D/C planning, etc.) 0 []  - Additional assistance / Altered mentation 0 []  - Support Surface(s) Assessment (bed, cushion, seat, etc.)  0 INTERVENTIONS - Wound Cleansing / Measurement X - Simple Wound Cleansing - one wound 1 5 []  - Complex Wound Cleansing - multiple wounds 0 X - Wound Imaging (  photographs - any number of wounds) 1 5 []  - Wound Tracing (instead of photographs) 0 X - Simple Wound Measurement - one wound 1 5 []  - Complex Wound Measurement - multiple wounds 0 INTERVENTIONS - Wound Dressings X - Small Wound Dressing one or multiple wounds 1 10 []  - Medium Wound Dressing one or multiple wounds 0 []  - Large Wound Dressing one or multiple wounds 0 X - Application of Medications - topical 1 5 []  - Application of Medications - injection 0 INTERVENTIONS - Miscellaneous []  - External ear exam 0 []  - Specimen Collection (cultures, biopsies, blood, body fluids, etc.) 0 []  - Specimen(s) / Culture(s) sent or taken to Lab for analysis 0 []  - Patient Transfer (multiple staff / Civil Service fast streamer / Similar devices) 0 []  - Simple Staple / Suture removal (25 or less) 0 []  - Complex Staple / Suture removal (26 or more) 0 []  - Hypo / Hyperglycemic Management (close monitor of Blood Glucose) 0 []  - Ankle / Brachial Index (ABI) - do not check if billed separately 0 X - Vital Signs 1 5 Has the patient been seen at the hospital within the last three years: Yes Total Score: 90 Level Of Care: New/Established - Level 3 Electronic Signature(s) Signed: 05/23/2019 5:54:02 PM By: Levan Hurst RN, BSN Entered By: Levan Hurst on 05/23/2019 16:30:59 -------------------------------------------------------------------------------- Encounter Discharge Information Details Patient Name: Date of Service: [name] 05/23/2019 10:00 AM Medical Record [id number] Patient Account Number: [id number] Date of Birth/Sex: Treating RN: [birth year] (53 y.o. Oval Linsey Primary Care Quinnten Calvin: [name] Other Clinician: Referring Makani Seckman: Treating Delona Clasby/Extender:Stone III, Rhea Bleacher, Marylyn Ishihara in Treatment:  1 Encounter Discharge Information Items Post Procedure Vitals Discharge Condition: Stable Temperature (F): 98.5 Ambulatory Status: Cane Pulse (bpm): 101 Discharge Destination: Home Respiratory Rate (breaths/min): 18 Transportation: Private Auto Blood Pressure (mmHg): 108/61 Accompanied By: self Schedule Follow-up Appointment: Yes Clinical Summary of Care: Patient Declined Electronic Signature(s) Signed: 05/23/2019 5:16:33 PM By: Carlene Coria RN Entered By: Carlene Coria on 05/23/2019 12:05:05 -------------------------------------------------------------------------------- Lower Extremity Assessment Details Patient Name: Date of Service: LATHON, BRAGANZA 05/23/2019 10:00 AM Medical Record [id number] Patient Account Number: [id number] Date of Birth/Sex: Treating RN: [birth year] (54 y.o. Hessie Diener Primary Care [place] Landgrebe: [name] Other Clinician: Referring Keiley Levey: Treating Maxxon Schwanke/Extender:Stone III, Rhea Bleacher, Marylyn Ishihara in Treatment: 1 Electronic Signature(s) Signed: 05/23/2019 5:43:51 PM By: Deon Pilling Entered By: Deon Pilling on 05/23/2019 10:42:02 -------------------------------------------------------------------------------- [place] Details Patient Name: Date of Service: [name] 05/23/2019 10:00 AM Medical Record [id number] Patient Account Number: [id number] Date of Birth/Sex: Treating RN: 12/16/1965 (54 y.o. [name] Primary Care Neima Lacross: [name] Other Clinician: Referring Ople Girgis: Treating Ramie Palladino/Extender:Stone III, Rhea Bleacher, Marylyn Ishihara in Treatment: 1 Active Inactive Abuse / Safety / Falls / Self Care Management Nursing Diagnoses: Potential for falls Goals: Patient/caregiver will verbalize/demonstrate measures taken to prevent injury and/or falls Date Initiated: 05/16/2019 Target Resolution Date: 06/13/2019 Goal Status: Active Interventions: Assess fall risk on  admission and as needed Notes: Nutrition Nursing Diagnoses: Impaired glucose control: actual or potential Goals: Patient/caregiver will maintain therapeutic glucose control Date Initiated: 05/16/2019 Target Resolution Date: 06/13/2019 Goal Status: Active Interventions: Assess patient nutrition upon admission and as needed per policy Treatment Activities: Patient referred to Primary Care Physician for further nutritional evaluation : 05/16/2019 Notes: Wound/Skin Impairment Nursing Diagnoses: Impaired tissue integrity Knowledge deficit related to ulceration/compromised skin integrity Goals: Patient/caregiver will verbalize understanding of skin care regimen Date Initiated: 05/16/2019 Target Resolution Date: 06/13/2019 Goal  Status: Active Ulcer/skin breakdown will have a volume reduction of 30% by week 4 Date Initiated: 05/16/2019 Target Resolution Date: 05/16/2019 Goal Status: Active Interventions: Assess patient/caregiver ability to obtain necessary supplies Assess patient/caregiver ability to perform ulcer/skin care regimen upon admission and as needed Assess ulceration(s) every visit Provide education on ulcer and skin care Treatment Activities: Skin care regimen initiated : 05/16/2019 Topical wound management initiated : 05/16/2019 Notes: Electronic Signature(s) Signed: 05/23/2019 5:54:02 PM By: Levan Hurst RN, BSN Entered By: Levan Hurst on 05/23/2019 16:30:26 -------------------------------------------------------------------------------- Pain Assessment Details Patient Name: Date of Service: [name] 05/23/2019 10:00 AM Medical Record [id number] Patient Account Number: [id number] Date of Birth/Sex: Treating RN: [birth year] (54 y.o. Hessie Diener Primary Care Emersynn Deatley: [name] Other Clinician: Referring Jenny Lai: Treating Brynden Thune/Extender:Stone III, Rhea Bleacher, Marylyn Ishihara in Treatment: 1 Active Problems Location of Pain Severity  and Description of Pain Patient Has Paino Yes Site Locations Pain Location: Pain in Ulcers Rate the pain. Current Pain Level: 7 Worst Pain Level: 9 Least Pain Level: 0 Tolerable Pain Level: 8 Pain Management and Medication Current Pain Management: Medication: Yes Cold Application: No Rest: Yes Massage: No Activity: No T.E.N.S.: No Heat Application: No Leg drop or elevation: Yes Is the Current Pain Management Adequate: Adequate How does your wound impact your activities of daily livingo Sleep: No Bathing: No Appetite: No Relationship With Others: No Bladder Continence: No Emotions: No Bowel Continence: No Work: No Toileting: No Drive: No Dressing: No Hobbies: No Electronic Signature(s) Signed: 05/23/2019 5:43:51 PM By: Deon Pilling Entered By: Deon Pilling on 05/23/2019 10:41:57 -------------------------------------------------------------------------------- Patient/Caregiver Education Details Patient Name: [name] 3/3/2021andnbsp10:00 Date of Service: AM Medical Record [id number] Number: Patient Account Number: [id number] Treating RN: Date of Birth/Gender: 02-12-1966 (54 y.o. [name]) Other Clinician: Primary Care Physician: [name] Treating Worthy Keeler Referring Physician: Physician/Extender: Ashley Jacobs in Treatment: 1 Education Assessment Education Provided To: Patient Education Topics Provided Wound/Skin Impairment: Methods: Explain/Verbal Responses: State content correctly Electronic Signature(s) Signed: 05/23/2019 5:54:02 PM By: Levan Hurst RN, BSN Entered By: Levan Hurst on 05/23/2019 16:30:36 -------------------------------------------------------------------------------- Wound Assessment Details Patient Name: Date of Service: [name] 05/23/2019 10:00 AM Medical Record [id number] Patient Account Number: [id number] Date of Birth/Sex: Treating RN: Mar 25, 1965 (54 y.o. [name] Primary Care Khianna Blazina: [name] Other Clinician: Referring Shenouda Genova: Treating Jamieka Royle/Extender:Stone III, Rhea Bleacher, Marylyn Ishihara in Treatment: 1 Wound Status Wound Number: 1 Primary Atypical Etiology: Wound Location: Penis Wound Open Wounding Event: Gradually Appeared Status: Date Acquired: 01/21/2019 Comorbid Anemia, Congestive Heart Failure, Weeks Of Treatment: 1 History: Hypertension, Type II Diabetes, End Stage Clustered Wound: No Renal Disease, Raynauds, Neuropathy Photos Wound Measurements Length: (cm) 2 % Reduct Width: (cm) 2 % Reduct Depth: (cm) 0.1 Epitheli Area: (cm) 3.142 Tunneli Volume: (cm) 0.314 Undermi Wound Description Full Thickness Without Exposed Support Foul Od Classification: Structures Slough/ Wound Flat and Intact Margin: Exudate Medium Amount: Exudate Serosanguineous Type: Exudate red, brown Color: Wound Bed Granulation Amount: Large (67-100%) Granulation Quality: Pink, Pale Fascia E Necrotic Amount: Small (1-33%) Fat Laye Necrotic Quality: Adherent Slough Tendon E Muscle E Joint Ex Bone Exp or After Cleansing: No Fibrino Yes Exposed Structure xposed: No r (Subcutaneous Tissue) Exposed: Yes xposed: No xposed: No posed: No osed: No ion in Area: 36% ion in Volume: 68% alization: None ng: No ning: No Assessment Notes macerated periwound. Treatment Notes Wound #1 (Penis) 1. Cleanse With Wound Cleanser 3. Primary Dressing Applied Santyl Notes normal saline moist gauze for  wet to dry Electronic Signature(s) Signed: 05/24/2019 3:59:23 PM By: Mikeal Hawthorne EMT/HBOT Signed: 05/24/2019 5:39:26 PM By: Levan Hurst RN, BSN Previous Signature: 05/23/2019 5:43:51 PM Version By: Deon Pilling Entered By: Mikeal Hawthorne on 05/24/2019 15:07:10 -------------------------------------------------------------------------------- Vitals Details Patient Name: Date of Service: [name]. 05/23/2019 10:00  AM Medical Record [id number] Patient Account Number: [id number] Date of Birth/Sex: Treating RN: August 22, 1965 (54 y.o. Hessie Diener Primary Care Laroy Mustard: [name] Other Clinician: Referring Yaakov Saindon: Treating Lavan Imes/Extender:Stone III, Rhea Bleacher, Marylyn Ishihara in Treatment: 1 Vital Signs Time Taken: 10:40 Temperature (F): 98.5 Height (in): 65 Pulse (bpm): 101 Weight (lbs): 240 Respiratory Rate (breaths/min): 18 Body Mass Index (BMI): 39.9 Blood Pressure (mmHg): 108/61 Capillary Blood Glucose (mg/dl): 182 Reference Range: 80 - 120 mg / dl Electronic Signature(s) Signed: 05/23/2019 5:43:51 PM By: Deon Pilling Entered By: Deon Pilling on 05/23/2019 10:42:32

## 2019-05-24 DIAGNOSIS — E119 Type 2 diabetes mellitus without complications: Secondary | ICD-10-CM | POA: Diagnosis not present

## 2019-05-24 DIAGNOSIS — D631 Anemia in chronic kidney disease: Secondary | ICD-10-CM | POA: Diagnosis not present

## 2019-05-24 DIAGNOSIS — D509 Iron deficiency anemia, unspecified: Secondary | ICD-10-CM | POA: Diagnosis not present

## 2019-05-24 DIAGNOSIS — N186 End stage renal disease: Secondary | ICD-10-CM | POA: Diagnosis not present

## 2019-05-24 DIAGNOSIS — N2581 Secondary hyperparathyroidism of renal origin: Secondary | ICD-10-CM | POA: Diagnosis not present

## 2019-05-24 NOTE — Progress Notes (Signed)
History of Present Illness  [name] [name] is a 54 y.o. (1965-03-27) male who presents with hx of BLE swelling and pain that was unrelenting and non radiating that had been present for a few weeks.  He was seen by Dr. Donzetta Matters on 04/27/2019 and at that time, he had normal ABI's.  He did have the beginnings of a wound on the left leg.  The pt had stated that he has Raynaud's Syndrome and had lost fingers on both hands and is managed for this at [facility].    He reports no change and no wound other than an anterior shin scratch.    He has ESRD and is on HD via right arm AVF that was created in [facility].  He has never had any BLE arterial or venous procedures.    Past Medical History:  Diagnosis Date  . Anal infection    posterior anal canal  . Anemia, chronic renal failure   . Atrophic kidney    BILATERAL  . DM type 2 causing ESRD [facility])    Nephrologist-- dr [name] [facility])--  on hemodialysis since June 2012 at  [name]  TTS  . Hemodialysis patient [facility])    at [place] on Tues/ Thur/Sat/schedule  . Hemorrhoids   . Hepatitis B antibody positive   . History of pleural effusion    bilateral  . Hyperparathyroidism, secondary renal ([place])   . Hypertension   . Ischemic cardiomyopathy    per echo 07-01-2014  ef 45%  . LAFB (left anterior fascicular block)   . Peripheral neuropathy   . Systolic and diastolic CHF, chronic ([place])    CARDIOLOGIST-  DR Daneen Schick ([place])  AND DR [name] ([name])    Past Surgical History:  Procedure Laterality Date  . APPENDECTOMY  09-12-2004   laparotomy w/ drainage peritinitis  . AV FISTULA PLACEMENT  02-27-2010   right forearm (RADIOCEPHALIC)  . AV FISTULA REPAIR  10-30-2010  . CARDIOVASCULAR STRESS TEST  10-29-2011   dr Daneen Schick   Low risk scan;  mild perfusion defect seen in the basal inferoseptal, basal inferior and mid inferior regions consistent with an  infarct/scar and/or overlying attenuation/  mild to moderate global LVSF,  ef 40-45%  . DOBUTAMINE STRESS ECHO  07-23-2012   [name]   abnormal ;  at rest estimated lvef 25-30% and global severe LV hypokinesis ;  no cp during stress and achieved 85% maxium predicted heart rate;  negative stress ECG for inducible ischemia;  estimated lvef with stress 35-40%;  augmentation of wall segments consistant with cardiomyopathy and differential fibrosis  . FISTULOTOMY N/A 03/26/2015   Procedure: FISTULOTOMY;  Surgeon: [name], MD;  Location: [facility];  Service: General;  Laterality: N/A;  . INCISION AND DRAINAGE ABSCESS N/A 03/26/2015   Procedure: ANAL INCISION AND DRAINAGE;  Surgeon: [name], MD;  Location: [place];  Service: General;  Laterality: N/A;  . RETINAL DETACHMENT SURGERY Left 2011   incomplete repair/ needs eye drops to keep pressure down  . TEE WITHOUT CARDIOVERSION N/A 10/17/2017   Procedure: TRANSESOPHAGEAL ECHOCARDIOGRAM (TEE);  Surgeon: Josue Hector, MD;  Location: [facility] ENDOSCOPY;  Service: Cardiovascular;  Laterality: N/A;  . TRANSTHORACIC ECHOCARDIOGRAM  07-01-2014    done at [facility]   grade 1 diastolic dysfunction,  ef 45%/  trace TR and PR  Social History   Socioeconomic History  . Marital status: Single    Spouse name: Not on file  . Number of children: Not on file  . Years of education: Not on file  . Highest education level: Not on file  Occupational History  . Not on file  Tobacco Use  . Smoking status: Never Smoker  . Smokeless tobacco: Never Used  Substance and Sexual Activity  . Alcohol use: No  . Drug use: No  . Sexual activity: Not on file  Other Topics Concern  . Not on file  Social History Narrative  . Not on file   Social Determinants of Health   Financial Resource Strain:   . Difficulty of Paying Living Expenses: Not on file  Food Insecurity:   . Worried About Charity fundraiser in the  Last Year: Not on file  . Ran Out of Food in the Last Year: Not on file  Transportation Needs:   . Lack of Transportation (Medical): Not on file  . Lack of Transportation (Non-Medical): Not on file  Physical Activity:   . Days of Exercise per Week: Not on file  . Minutes of Exercise per Session: Not on file  Stress:   . Feeling of Stress : Not on file  Social Connections:   . Frequency of Communication with Friends and Family: Not on file  . Frequency of Social Gatherings with Friends and Family: Not on file  . Attends Religious Services: Not on file  . Active Member of Clubs or Organizations: Not on file  . Attends Archivist Meetings: Not on file  . Marital Status: Not on file  Intimate Partner Violence:   . Fear of Current or Ex-Partner: Not on file  . Emotionally Abused: Not on file  . Physically Abused: Not on file  . Sexually Abused: Not on file    Family History  Family history unknown: Yes    Current Outpatient Medications on File Prior to Visit  Medication Sig Dispense Refill  . acetaminophen (TYLENOL) 500 MG tablet Take 1,000 mg by mouth as needed.    [name] aspirin 325 MG tablet Take 325 mg by mouth daily.    . calcium acetate (PHOSLO) 667 MG capsule Take 2,001 mg by mouth 3 (three) times daily with meals.     . celecoxib (CELEBREX) 100 MG capsule Take 100 mg by mouth daily.    . cyanocobalamin 1000 MCG tablet Take by mouth.    . cycloSPORINE (RESTASIS) 0.05 % ophthalmic emulsion Place 1 drop into both eyes 2 (two) times daily.    . febuxostat (ULORIC) 40 MG tablet Take 40 mg by mouth daily.     . ferric citrate (AURYXIA) 1 GM 210 MG(Fe) tablet Take 420 mg by mouth 3 (three) times daily.    [name] gabapentin (NEURONTIN) 300 MG capsule Take 300 mg by mouth 3 (three) times daily as needed. For pain  3  . midodrine (PROAMATINE) 5 MG tablet Take 5 mg by mouth 3 (three) times a week. Every Tuesday Thursday and Saturday    . Multiple Vitamins-Minerals (MEGA MULTIVITAMIN  FOR MEN PO) Take 1 tablet by mouth daily.     . NON FORMULARY humalog omnipod three times daily adjusted to food intake    . oxyCODONE-acetaminophen (PERCOCET) 10-325 MG tablet Take 1 tablet by mouth every 4 (four) hours as needed for pain.      No current facility-administered medications on file prior to visit.    Allergies as  of 05/25/2019  . (No Known Allergies)     ROS:   General:  No weight loss, Fever, chills  HEENT: No recent headaches, no nasal bleeding, no visual changes, no sore throat  Neurologic: No dizziness, blackouts, seizures. No recent symptoms of stroke or mini- stroke. No recent episodes of slurred speech, or temporary blindness.  Cardiac: No recent episodes of chest pain/pressure, no shortness of breath at rest.  No shortness of breath with exertion.  Denies history of atrial fibrillation or irregular heartbeat  Vascular: No history of rest pain in feet.  No history of claudication.  No history of non-healing ulcer, No history of DVT  B LE edema  Pulmonary: No home oxygen, no productive cough, no hemoptysis,  No asthma or wheezing  Musculoskeletal:  [ ]  Arthritis, [ ]  Low back pain,  [x ] Joint pain  Hematologic:No history of hypercoagulable state.  No history of easy bleeding.  No history of anemia  Gastrointestinal: No hematochezia or melena,  No gastroesophageal reflux, no trouble swallowing  Urinary: [x ] chronic Kidney disease, [ ]  on HD - [ ]  MWF or [ ]  TTHS, [ ]  Burning with urination, [ ]  Frequent urination, [ ]  Difficulty urinating;   Skin: No rashes  Psychological: No history of anxiety,  No history of depression  Physical Examination  Vitals:   05/25/19 1555  BP: 107/60  Pulse: 95  Resp: 16  Temp: 98.1 F (36.7 C)  TempSrc: Temporal  SpO2: 98%  Weight: 242 lb (109.8 kg)  Height: 5\' 5"  (1.651 m)    Body mass index is 40.27 kg/m.  General:  Alert and oriented, no acute distress HEENT: Normal Neck: No bruit or JVD Pulmonary:  Clear to auscultation bilaterally Cardiac: Regular Rate and Rhythm without murmur Abdomen: Soft, non-tender, non-distended, no mass, no scars Skin: No rash Extremity Pulses:  Brawny/thick skin edema B LE non palpable pedal pulses  Neurologic: Upper and lower extremity motor grossly intact and symmetric  DATA:   Venous Reflux Times  +--------------+---------+------+-----------+------------+--------------+  RIGHT     Reflux NoRefluxReflux TimeDiameter cmsComments                  Yes                      +--------------+---------+------+-----------+------------+--------------+  CFV      no                            +--------------+---------+------+-----------+------------+--------------+  FV mid    no                            +--------------+---------+------+-----------+------------+--------------+  Popliteal   no                            +--------------+---------+------+-----------+------------+--------------+  GSV at SFJ        yes  >500 ms   0.554           +--------------+---------+------+-----------+------------+--------------+  GSV prox thighno               0.529           +--------------+---------+------+-----------+------------+--------------+  GSV mid thigh no               0.353           +--------------+---------+------+-----------+------------+--------------+  GSV dist thighno  0.220           +--------------+---------+------+-----------+------------+--------------+  GSV at knee  no               0.237           +--------------+---------+------+-----------+------------+--------------+  GSV prox calf no               0.255             +--------------+---------+------+-----------+------------+--------------+  GSV mid calf no               0.346           +--------------+---------+------+-----------+------------+--------------+  GSV dist calf no                            +--------------+---------+------+-----------+------------+--------------+  SSV Pop Fossa       yes  >500 ms   0.224           +--------------+---------+------+-----------+------------+--------------+  SSV prox calf                     Not Visualized  +--------------+---------+------+-----------+------------+--------------+  SSV mid calf                     Not Visualized  +--------------+---------+------+-----------+------------+--------------+   +--------------+---------+------+-----------+------------+----------------+   LEFT     Reflux NoRefluxReflux TimeDiameter cmsComments                     Yes                         +--------------+---------+------+-----------+------------+----------------+   CFV      no                               +--------------+---------+------+-----------+------------+----------------+   FV mid    no                               +--------------+---------+------+-----------+------------+----------------+   Popliteal   no                               +--------------+---------+------+-----------+------------+----------------+   GSV at SFJ  no               0.766              +--------------+---------+------+-----------+------------+----------------+   GSV prox thighno               0.376               +--------------+---------+------+-----------+------------+----------------+   GSV mid thigh no               0.296              +--------------+---------+------+-----------+------------+----------------+   GSV dist thighno               0.364              +--------------+---------+------+-----------+------------+----------------+   GSV at knee  no               0.304              +--------------+---------+------+-----------+------------+----------------+   GSV prox calf no               0.242              +--------------+---------+------+-----------+------------+----------------+  GSV mid calf no               0.201              +--------------+---------+------+-----------+------------+----------------+   SSV Pop Fossa                     chronic  thrombus  +--------------+---------+------+-----------+------------+----------------+   SSV prox calf                     chronic  thrombus  +--------------+---------+------+-----------+------------+----------------+   SSV mid calf                     chronic  thrombus  +--------------+---------+------+-----------+------------+----------------+    Summary:  Right:  - No evidence of deep vein thrombosis seen in the right lower extremity,  from the common femoral through the popliteal veins.  - No evidence of superficial venous thrombosis in the right lower  extremity.  - No evidence of superficial venous reflux seen in the right greater  saphenous vein.  - Venous reflux is noted in the right sapheno-femoral junction.  - Venous reflux is noted in the right short saphenous vein.    Left:  - No evidence of deep vein thrombosis seen in the left lower extremity,  from the common femoral through the popliteal  veins.  - There is no evidence of venous reflux seen in the left lower extremity.  - No evidence of superficial venous reflux seen in the left greater  saphenous vein.  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    Assessment: Chronic B LE edema with thickened skin.  Heterogenous probable enlarged lymph node with atypical appearance. 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   Plan: He has good arterial flow B LE with biphasic flow. No reflux in the venous system.  Atypical lymph nodes noted on ultrasound.  I have referred him to General surgery for further work up.  He will f/u with our office as needed.  Roxy Horseman PA-C [facility] Office: [phone]  MD in clinic [place]

## 2019-05-25 ENCOUNTER — Other Ambulatory Visit: Payer: Self-pay

## 2019-05-25 ENCOUNTER — Ambulatory Visit (INDEPENDENT_AMBULATORY_CARE_PROVIDER_SITE_OTHER): Payer: Medicare Other | Admitting: Physician Assistant

## 2019-05-25 ENCOUNTER — Encounter (HOSPITAL_COMMUNITY): Payer: Medicare Other

## 2019-05-25 ENCOUNTER — Ambulatory Visit: Payer: Medicare Other

## 2019-05-25 ENCOUNTER — Ambulatory Visit (HOSPITAL_COMMUNITY)
Admission: RE | Admit: 2019-05-25 | Discharge: 2019-05-25 | Disposition: A | Discharge status: Home / Self Care | Payer: Medicare Other | Source: Ambulatory Visit | Attending: Vascular Surgery | Admitting: Vascular Surgery

## 2019-05-25 VITALS — BP 107/60 | HR 95 | Temp 98.1°F | Resp 16 | Ht 65.0 in | Wt 242.0 lb

## 2019-05-25 DIAGNOSIS — R600 Localized edema: Secondary | ICD-10-CM | POA: Diagnosis not present

## 2019-05-25 DIAGNOSIS — I739 Peripheral vascular disease, unspecified: Secondary | ICD-10-CM | POA: Insufficient documentation

## 2019-05-26 DIAGNOSIS — N186 End stage renal disease: Secondary | ICD-10-CM | POA: Diagnosis not present

## 2019-05-26 DIAGNOSIS — D509 Iron deficiency anemia, unspecified: Secondary | ICD-10-CM | POA: Diagnosis not present

## 2019-05-26 DIAGNOSIS — N2581 Secondary hyperparathyroidism of renal origin: Secondary | ICD-10-CM | POA: Diagnosis not present

## 2019-05-26 DIAGNOSIS — D631 Anemia in chronic kidney disease: Secondary | ICD-10-CM | POA: Diagnosis not present

## 2019-05-28 DIAGNOSIS — E875 Hyperkalemia: Secondary | ICD-10-CM | POA: Diagnosis not present

## 2019-05-28 DIAGNOSIS — N186 End stage renal disease: Secondary | ICD-10-CM | POA: Diagnosis not present

## 2019-05-28 DIAGNOSIS — I5030 Unspecified diastolic (congestive) heart failure: Secondary | ICD-10-CM | POA: Diagnosis not present

## 2019-05-28 DIAGNOSIS — Z79891 Long term (current) use of opiate analgesic: Secondary | ICD-10-CM | POA: Diagnosis not present

## 2019-05-28 DIAGNOSIS — M79641 Pain in right hand: Secondary | ICD-10-CM | POA: Diagnosis not present

## 2019-05-28 DIAGNOSIS — E7800 Pure hypercholesterolemia, unspecified: Secondary | ICD-10-CM | POA: Diagnosis not present

## 2019-05-28 DIAGNOSIS — Z79899 Other long term (current) drug therapy: Secondary | ICD-10-CM | POA: Diagnosis not present

## 2019-05-28 DIAGNOSIS — I739 Peripheral vascular disease, unspecified: Secondary | ICD-10-CM | POA: Diagnosis not present

## 2019-05-28 DIAGNOSIS — I8390 Asymptomatic varicose veins of unspecified lower extremity: Secondary | ICD-10-CM | POA: Diagnosis not present

## 2019-05-28 DIAGNOSIS — M79669 Pain in unspecified lower leg: Secondary | ICD-10-CM | POA: Diagnosis not present

## 2019-05-28 DIAGNOSIS — E1142 Type 2 diabetes mellitus with diabetic polyneuropathy: Secondary | ICD-10-CM | POA: Diagnosis not present

## 2019-05-28 DIAGNOSIS — M159 Polyosteoarthritis, unspecified: Secondary | ICD-10-CM | POA: Diagnosis not present

## 2019-05-29 DIAGNOSIS — D509 Iron deficiency anemia, unspecified: Secondary | ICD-10-CM | POA: Diagnosis not present

## 2019-05-29 DIAGNOSIS — N2581 Secondary hyperparathyroidism of renal origin: Secondary | ICD-10-CM | POA: Diagnosis not present

## 2019-05-29 DIAGNOSIS — D631 Anemia in chronic kidney disease: Secondary | ICD-10-CM | POA: Diagnosis not present

## 2019-05-29 DIAGNOSIS — N186 End stage renal disease: Secondary | ICD-10-CM | POA: Diagnosis not present

## 2019-05-30 ENCOUNTER — Encounter (HOSPITAL_BASED_OUTPATIENT_CLINIC_OR_DEPARTMENT_OTHER): Payer: Medicare Other | Admitting: Physician Assistant

## 2019-05-30 ENCOUNTER — Other Ambulatory Visit: Payer: Self-pay

## 2019-05-30 DIAGNOSIS — L98492 Non-pressure chronic ulcer of skin of other sites with fat layer exposed: Secondary | ICD-10-CM | POA: Diagnosis not present

## 2019-05-30 DIAGNOSIS — I120 Hypertensive chronic kidney disease with stage 5 chronic kidney disease or end stage renal disease: Secondary | ICD-10-CM | POA: Diagnosis not present

## 2019-05-30 DIAGNOSIS — N485 Ulcer of penis: Secondary | ICD-10-CM | POA: Diagnosis not present

## 2019-05-30 DIAGNOSIS — M2550 Pain in unspecified joint: Secondary | ICD-10-CM | POA: Diagnosis not present

## 2019-05-30 DIAGNOSIS — E11622 Type 2 diabetes mellitus with other skin ulcer: Secondary | ICD-10-CM | POA: Diagnosis not present

## 2019-05-30 DIAGNOSIS — E1165 Type 2 diabetes mellitus with hyperglycemia: Secondary | ICD-10-CM | POA: Diagnosis not present

## 2019-05-30 DIAGNOSIS — L905 Scar conditions and fibrosis of skin: Secondary | ICD-10-CM | POA: Diagnosis not present

## 2019-05-30 DIAGNOSIS — E7800 Pure hypercholesterolemia, unspecified: Secondary | ICD-10-CM | POA: Diagnosis not present

## 2019-05-30 DIAGNOSIS — E1140 Type 2 diabetes mellitus with diabetic neuropathy, unspecified: Secondary | ICD-10-CM | POA: Diagnosis not present

## 2019-05-30 DIAGNOSIS — E1122 Type 2 diabetes mellitus with diabetic chronic kidney disease: Secondary | ICD-10-CM | POA: Diagnosis not present

## 2019-05-30 DIAGNOSIS — N186 End stage renal disease: Secondary | ICD-10-CM | POA: Diagnosis not present

## 2019-05-30 NOTE — Progress Notes (Addendum)
NATALIO, SALOIS ([id number]) Visit Report for 05/30/2019 Chief Complaint Document Details Patient Name: Date of Service: KALIX, MEINECKE 05/30/2019 10:15 AM Medical Record [id number] Patient Account Number: [id number] Date of Birth/Sex: Treating RN: 29-Sep-1965 (54 y.o. [name] Primary Care Provider: [name] Other Clinician: Referring Provider: Treating Provider/Extender:Stone III, Rhea Bleacher, Marylyn Ishihara in Treatment: 2 Information Obtained from: Patient Chief Complaint Penis Ulcer Electronic Signature(s) Signed: 05/30/2019 10:39:56 AM By: Worthy Keeler PA-C Entered By: Worthy Keeler on 05/30/2019 10:39:55 -------------------------------------------------------------------------------- Debridement Details Patient Name: Date of Service: JAYMOND, WAAGE 05/30/2019 10:15 AM Medical Record [id number] Patient Account Number: [id number] Date of Birth/Sex: 07-06-1965 (54 y.o. M) Treating RN: Baruch Gouty Primary Care Provider: [name] Other Clinician: Referring Provider: Treating Provider/Extender:Stone III, Rhea Bleacher, Marylyn Ishihara in Treatment: 2 Debridement Performed for Wound #1 Penis Assessment: Performed By: Clinician Carlene Coria, RN Debridement Type: Chemical/Enzymatic/Mechanical Agent Used: Santyl Level of Consciousness (Pre- Awake and Alert procedure): Pre-procedure Verification/Time Out Taken: Yes - 11:07 Bleeding: None Response to Treatment: Procedure was tolerated well Level of Consciousness Awake and Alert (Post-procedure): Post Debridement Measurements of Total Wound Length: (cm) 2 Width: (cm) 1.8 Depth: (cm) 0.1 Volume: (cm) 0.283 Character of Wound/Ulcer Post Requires Further Debridement Debridement: Post Procedure Diagnosis Same as Pre-procedure Electronic Signature(s) Signed: 05/30/2019 5:29:37 PM By: Worthy Keeler PA-C Signed: 05/30/2019 5:43:44 PM By: Baruch Gouty RN, BSN Entered  By: Baruch Gouty on 05/30/2019 11:08:22 -------------------------------------------------------------------------------- HPI Details Patient Name: Date of Service: [name] 05/30/2019 10:15 AM Medical Record [id number] Patient Account Number: [id number] Date of Birth/Sex: Treating RN: 01/31/1966 (54 y.o. [name] Primary Care Provider: [name] Other Clinician: Referring Provider: Treating Provider/Extender:Stone III, Rhea Bleacher, Marylyn Ishihara in Treatment: 2 History of Present Illness HPI Description: 05/16/2019 patient presents today for initial evaluation here in our clinic concerning an issue he has been having with a wound on the dorsal surface of the distal portion of his penis. This has been present he tells me for about 2 months I do have a note from the urologist which actually states 4 months. 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 With that being said the patient has also been seen by his primary care provider he tells [place] that he has been screened for sexually transmitted diseases including syphilis although we did contact his primary care provider as well who states that they did not have any testing on him as such. Urology also did not perform any testing. The patient tells me he has not been sexually active since 2014 that he has not had any ability to do so. He is on dialysis and unfortunately is having a lot of issues as far as sexual dysfunction is concerned. 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 He tells me that the ulcer came out first as a bump on the end of his penis which was tender to touch. He continue to watch this and unfortunately as things progressed he states that it eventually opened, ulcerated, and  has been extremely painful. He really does not urinate secondary to being on dialysis and his end- stage renal disease. He does have diabetes mellitus type 2. 05/23/2019 upon evaluation today patient's wound on his penis actually appears to be doing better compared to last  week's evaluation. Unfortunately he still is having significant pain but there is really not necrotic tissue noted as badly as it was at the last visit. Overall I am pleased in this regard. We still have not been able to get the result back from the RPR testing that he had done at [place]. We have called them throughout the day today and we have not been able to get to talk with anyone yet. Obviously as soon as we get the results of this we will have to likely give them a call not to make him stay here all day. 05/30/2019 upon evaluation today fortunately we finally got the patient's RPR test back and it was negative. With that being said he is continuing to have some issues with pain. No fevers, chills, nausea, vomiting, or diarrhea. Overall I do feel like the wound is making good progress however Electronic Signature(s) Signed: 05/30/2019 11:11:39 AM By: Worthy Keeler PA-C Entered By: Worthy Keeler on 05/30/2019 11:11:38 -------------------------------------------------------------------------------- Physical Exam Details Patient Name: Date of Service: SARKIS, RHINES 05/30/2019 10:15 AM Medical Record [id number] Patient Account Number: [id number] Date of Birth/Sex: Treating RN: [birth year] (54 y.o. [name] Primary Care Provider: [name] Other Clinician: Referring Provider: Treating Provider/Extender:Stone III, Rhea Bleacher, Marylyn Ishihara in Treatment: 2 Constitutional Well-nourished and well-hydrated in no acute distress. Respiratory normal breathing without difficulty. Psychiatric this patient is able to make decisions and demonstrates good insight into disease process. Alert and  Oriented x 3. pleasant and cooperative. Notes Patient's wound currently did have minimal slough noted compared to where he started he has a lot of granulation tissue which is great news and overall I do feel like he is healing quite nicely all things considered. Fortunately there is no signs of active infection at this point. No fevers, chills, nausea, vomiting, or diarrhea. Electronic Signature(s) Signed: 05/30/2019 11:11:59 AM By: Worthy Keeler PA-C Entered By: Worthy Keeler on 05/30/2019 11:11:58 -------------------------------------------------------------------------------- Physician Orders Details Patient Name: Date of Service: KYNG, MATLOCK 05/30/2019 10:15 AM Medical Record [id number] Patient Account Number: [id number] Date of Birth/Sex: Treating RN: September 23, 1965 (54 y.o. [name] Primary Care Provider: [name] Other Clinician: Referring Provider: Treating Provider/Extender:Stone III, Rhea Bleacher, Marylyn Ishihara in Treatment: 2 Verbal / Phone Orders: No Diagnosis Coding ICD-10 Coding Code Description N48.5 Ulcer of penis N18.6 End stage renal disease Z99.2 Dependence on renal dialysis E11.622 Type 2 diabetes mellitus with other skin ulcer Follow-up Appointments Return Appointment in 2 weeks. Dressing Change Frequency Wound #1 Penis Change dressing every day. Wound Cleansing Wound #1 Penis May shower and wash wound with soap and water. Primary Wound Dressing Wound #1 Penis Santyl Ointment Secondary Dressing Wound #1 Penis Kerlix/Rolled Gauze Other: - saline moistened gauze over santyl Electronic Signature(s) Signed: 05/30/2019 5:29:37 PM By: Worthy Keeler PA-C Signed: 05/30/2019 5:43:44 PM By: Baruch Gouty RN, BSN Entered By: Baruch Gouty on 05/30/2019 11:09:28 -------------------------------------------------------------------------------- Problem List Details Patient Name: Date of Service: KALMAN, NYLEN 05/30/2019  10:15 AM Medical Record [id number] Patient Account Number: [id number] Date of Birth/Sex: Treating RN: 05-Mar-1966 (54 y.o. [name] Primary Care Provider: [name] Other Clinician: Referring Provider: Treating Provider/Extender:Stone III, Rhea Bleacher, Marylyn Ishihara in Treatment: 2 Active Problems ICD-10 Evaluated Encounter Code Description Active Date Today Diagnosis N48.5 Ulcer of penis 05/16/2019 No Yes N18.6 End stage renal disease 05/16/2019 No Yes Z99.2 Dependence on renal dialysis 05/16/2019 No Yes E11.622 Type 2 diabetes mellitus with other skin ulcer 05/16/2019 No Yes  Inactive Problems Resolved Problems Electronic Signature(s) Signed: 05/30/2019 10:39:49 AM By: Worthy Keeler PA-C Entered By: Worthy Keeler on 05/30/2019 10:39:48 -------------------------------------------------------------------------------- Progress Note Details Patient Name: Date of Service: BRADLEY, BOSTELMAN 05/30/2019 10:15 AM Medical Record [id number] Patient Account Number: [id number] Date of Birth/Sex: Treating RN: [birth year] (54 y.o. [name] Primary Care Provider: [name] Other Clinician: Referring Provider: Treating Provider/Extender:Stone III, Rhea Bleacher, Marylyn Ishihara in Treatment: 2 Subjective Chief Complaint Information obtained from Patient Penis Ulcer History of Present Illness (HPI) 05/16/2019 patient presents today for initial evaluation here in our clinic concerning an issue he has been having with a wound on the dorsal surface of the distal portion of his penis. This has been present he tells me for about 2 months I do have a note from the urologist which actually states 4 months. 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 With that being said the patient has also been seen by his primary care provider he tells [place] that  he has been screened for sexually transmitted diseases including syphilis although we did contact his primary care provider as well who states that they did not have any testing on him as such. Urology also did not perform any testing. The patient tells me he has not been sexually active since 2014 that he has not had any ability to do so. He is on dialysis and unfortunately is having a lot of issues as far as sexual dysfunction is concerned. 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 He tells me that the ulcer came out first as a bump on the end of his penis which was tender to touch. He continue to watch this and unfortunately as things progressed he states that it eventually opened, ulcerated, and has been extremely painful. He really does not urinate secondary to being on dialysis and his end-stage renal disease. He does have diabetes mellitus type 2. 05/23/2019 upon evaluation today patient's wound on his penis actually appears to be doing better compared to last week's evaluation. Unfortunately he still is having significant pain but there is really not necrotic tissue noted as badly as it was at the last visit. Overall I am pleased in this regard. We still have not been able to get the result back from the RPR testing that he had done at [place]. We have called them throughout the day today and we have not been able to get to talk with anyone yet. Obviously as soon as we get the results of this we will have to likely give them a call not to make him stay here all day. 05/30/2019 upon evaluation today fortunately we finally got the patient's RPR test back and it was negative. With that being said he is continuing to have some issues with pain. No fevers, chills, nausea, vomiting, or diarrhea. Overall I do feel like the wound is making good progress  however Objective Constitutional Well-nourished and well-hydrated in no acute distress. Vitals Time Taken: 10:38 AM, Height: 65 in, Weight: 240 lbs, BMI: 39.9, Temperature: 98.0 F, Pulse: 101 bpm, Respiratory Rate: 18 breaths/min, Blood Pressure: 111/70 mmHg, Capillary Blood Glucose: 163 mg/dl. General Notes: glucose per pt report Respiratory normal breathing without difficulty. Psychiatric this patient is able to make decisions and demonstrates good insight into  disease process. Alert and Oriented x 3. pleasant and cooperative. General Notes: Patient's wound currently did have minimal slough noted compared to where he started he has a lot of granulation tissue which is great news and overall I do feel like he is healing quite nicely all things considered. Fortunately there is no signs of active infection at this point. No fevers, chills, nausea, vomiting, or diarrhea. Integumentary (Hair, Skin) Wound #1 status is Open. Original cause of wound was Gradually Appeared. The wound is located on the Penis. The wound measures 2cm length x 1.8cm width x 0.1cm depth; 2.827cm^2 area and 0.283cm^3 volume. There is Fat Layer (Subcutaneous Tissue) Exposed exposed. There is no tunneling or undermining noted. There is a medium amount of serosanguineous drainage noted. The wound margin is flat and intact. There is medium (34-66%) pink, pale granulation within the wound bed. There is a medium (34-66%) amount of necrotic tissue within the wound bed including Adherent Slough. Assessment Active Problems ICD-10 Ulcer of penis End stage renal disease Dependence on renal dialysis Type 2 diabetes mellitus with other skin ulcer Procedures Wound #1 Pre-procedure diagnosis of Wound #1 is an Atypical located on the Penis . There was a Chemical/Enzymatic/Mechanical debridement performed by Carlene Coria, RN.[name] Agent used was [employer]. A time out was conducted at 11:07, prior to the start of the procedure. There  was no bleeding. The procedure was tolerated well. Post Debridement Measurements: 2cm length x 1.8cm width x 0.1cm depth; 0.283cm^3 volume. Character of Wound/Ulcer Post Debridement requires further debridement. Post procedure Diagnosis Wound #1: Same as Pre-Procedure Plan Follow-up Appointments: Return Appointment in 2 weeks. Dressing Change Frequency: Wound #1 Penis: Change dressing every day. Wound Cleansing: Wound #1 Penis: May shower and wash wound with soap and water. Primary Wound Dressing: Wound #1 Penis: Santyl Ointment Secondary Dressing: Wound #1 Penis: Kerlix/Rolled Gauze Other: - saline moistened gauze over santyl 1. I do believe the Annitta Needs is doing well for the patient. I am still pleased with the fact that he is making fairly good progress considering the location of the wound and the severity of the wound when he first showed up. Overall I do believe he is headed in the right direction. 2. I would recommend that we continue with the Santyl and the dressings as before. We may can consider collagen in the future were not quite there yet. 3. I am also can recommend obviously that he change this dressing daily I think that is probably good to be necessary no matter what we use just due to the location. We will see patient back for reevaluation in 1 week here in the clinic. If anything worsens or changes patient will contact our office for additional recommendations. Electronic Signature(s) Signed: 05/30/2019 11:12:47 AM By: Worthy Keeler PA-C Entered By: Worthy Keeler on 05/30/2019 11:12:47 -------------------------------------------------------------------------------- SuperBill Details Patient Name: Date of Service: [name] 05/30/2019 Medical Record [id number] Patient Account Number: [id number] Date of Birth/Sex: Treating RN: Apr 06, 1965 (54 y.o. [name] Primary Care Provider: [name] Other Clinician: Referring Provider:  Treating Provider/Extender:Stone III, Rhea Bleacher, Marylyn Ishihara in Treatment: 2 Diagnosis Coding ICD-10 Codes Code Description N48.5 Ulcer of penis N18.6 End stage renal disease Z99.2 Dependence on renal dialysis E11.622 Type 2 diabetes mellitus with other skin ulcer Facility Procedures CPT4 Code: 91478295 Description: 62130 - DEBRIDE W/O ANES NON SELECT Modifier: Quantity: 1 Physician Procedures CPT4 Code: 8657846 Description: 96295 - WC PHYS LEVEL 3 - EST PT ICD-10 Diagnosis Description N48.5 Ulcer of  penis N18.6 End stage renal disease Z99.2 Dependence on renal dialysis E11.622 Type 2 diabetes mellitus with other skin ulcer Modifier: Quantity: 1 Electronic Signature(s) Signed: 05/30/2019 11:12:59 AM By: Worthy Keeler PA-C Entered By: Worthy Keeler on 05/30/2019 11:12:58

## 2019-05-31 DIAGNOSIS — N186 End stage renal disease: Secondary | ICD-10-CM | POA: Diagnosis not present

## 2019-05-31 DIAGNOSIS — N2581 Secondary hyperparathyroidism of renal origin: Secondary | ICD-10-CM | POA: Diagnosis not present

## 2019-05-31 DIAGNOSIS — D509 Iron deficiency anemia, unspecified: Secondary | ICD-10-CM | POA: Diagnosis not present

## 2019-05-31 DIAGNOSIS — D631 Anemia in chronic kidney disease: Secondary | ICD-10-CM | POA: Diagnosis not present

## 2019-06-02 DIAGNOSIS — D509 Iron deficiency anemia, unspecified: Secondary | ICD-10-CM | POA: Diagnosis not present

## 2019-06-02 DIAGNOSIS — D631 Anemia in chronic kidney disease: Secondary | ICD-10-CM | POA: Diagnosis not present

## 2019-06-02 DIAGNOSIS — N2581 Secondary hyperparathyroidism of renal origin: Secondary | ICD-10-CM | POA: Diagnosis not present

## 2019-06-02 DIAGNOSIS — N186 End stage renal disease: Secondary | ICD-10-CM | POA: Diagnosis not present

## 2019-06-02 NOTE — Progress Notes (Deleted)
Cardiology Office Note:    Date:  06/02/2019   ID:  [name] [name], DOB Nov 22, 1965, MRN [id number]  PCP:  [name], MD  Cardiologist:  Sinclair Grooms, MD   Referring MD: [name], MD   No chief complaint on file.   History of Present Illness:    [name] [name] is a 54 y.o. 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   ***  Past Medical History:  Diagnosis Date  . Anal infection    posterior anal canal  . Anemia, chronic renal failure   . Atrophic kidney    BILATERAL  . DM type 2 causing ESRD [name])    Nephrologist-- dr [name] [facility])--  on hemodialysis since June 2012 at  [name]  TTS  . Hemodialysis patient [facility])    at [name]  . Hemorrhoids   . Hepatitis B antibody positive   . History of pleural effusion    bilateral  . Hyperparathyroidism, secondary renal ([place])   . Hypertension   . Ischemic cardiomyopathy    per echo 07-01-2014  ef 45%  . LAFB (left anterior fascicular block)   . Peripheral neuropathy   . Systolic and diastolic CHF, chronic ([name])    CARDIOLOGIST-  DR Daneen Schick ([name])  AND DR [name] ([name])    Past Surgical History:  Procedure Laterality Date  . APPENDECTOMY  09-12-2004   laparotomy w/ drainage peritinitis  . AV FISTULA PLACEMENT  02-27-2010   right forearm (RADIOCEPHALIC)  . AV FISTULA REPAIR  10-30-2010  . CARDIOVASCULAR STRESS TEST  10-29-2011   dr Daneen Schick   Low risk scan;  mild perfusion defect seen in the basal inferoseptal, basal inferior and mid inferior regions consistent with an infarct/scar and/or overlying attenuation/  mild to moderate global LVSF,  ef 40-45%  . DOBUTAMINE STRESS ECHO   07-23-2012   [name]   abnormal ;  at rest estimated lvef 25-30% and global severe LV hypokinesis ;  no cp during stress and achieved 85% maxium predicted heart rate;  negative stress ECG for inducible ischemia;  estimated lvef with stress 35-40%;  augmentation of wall segments consistant with cardiomyopathy and differential fibrosis  . FISTULOTOMY N/A 03/26/2015   Procedure: FISTULOTOMY;  Surgeon: [name], MD;  Location: [name];  Service: General;  Laterality: N/A;  . INCISION AND DRAINAGE ABSCESS N/A 03/26/2015   Procedure: ANAL INCISION AND DRAINAGE;  Surgeon: [name], MD;  Location: [place];  Service: General;  Laterality: N/A;  . RETINAL DETACHMENT SURGERY Left 2011   incomplete repair/ needs eye drops to keep pressure down  . TEE WITHOUT CARDIOVERSION N/A 10/17/2017   Procedure: TRANSESOPHAGEAL ECHOCARDIOGRAM (TEE);  Surgeon: Josue Hector, MD;  Location: [name];  Service: Cardiovascular;  Laterality: N/A;  . TRANSTHORACIC ECHOCARDIOGRAM  07-01-2014    done at [name]   grade 1 diastolic dysfunction,  ef 45%/  trace TR and PR    Current Medications: No outpatient medications have been marked as taking for the 06/04/19 encounter (Appointment) with [name], MD.     Allergies:   Patient has no known allergies.   Social History   Socioeconomic History  . Marital status: Single    Spouse name:  Not on file  . Number of children: Not on file  . Years of education: Not on file  . Highest education level: Not on file  Occupational History  . Not on file  Tobacco Use  . Smoking status: Never Smoker  . Smokeless tobacco: Never Used  Substance and Sexual Activity  . Alcohol use: No  . Drug use: No  . Sexual activity: Not on file  Other Topics Concern  . Not on file  Social History Narrative  . Not on file   Social Determinants of Health   Financial Resource Strain:   . Difficulty of Paying Living  Expenses:   Food Insecurity:   . Worried About Charity fundraiser in the Last Year:   . Arboriculturist in the Last Year:   Transportation Needs:   . Film/video editor (Medical):   [name] Lack of Transportation (Non-Medical):   Physical Activity:   . Days of Exercise per Week:   . Minutes of Exercise per Session:   Stress:   . Feeling of Stress :   Social Connections:   . Frequency of Communication with Friends and Family:   . Frequency of Social Gatherings with Friends and Family:   . Attends Religious Services:   . Active Member of Clubs or Organizations:   . Attends Archivist Meetings:   [name] Marital Status:      Family History: The patient's Family history is unknown by patient.  ROS:   Please see the history of present illness.    *** All other systems reviewed and are negative.  EKGs/Labs/Other Studies Reviewed:    The following studies were reviewed today: ***  EKG:  EKG ***  Recent Labs: 04/12/2019: BUN 26; Creatinine, Ser 9.21; Hemoglobin 10.8; Platelets 329; Potassium 6.3; Sodium 139  Recent Lipid Panel No results found for: CHOL, TRIG, HDL, CHOLHDL, VLDL, LDLCALC, LDLDIRECT  Physical Exam:    VS:  There were no vitals taken for this visit.    Wt Readings from Last 3 Encounters:  05/25/19 242 lb (109.8 kg)  04/30/19 244 lb (110.7 kg)  04/27/19 242 lb (109.8 kg)     GEN: ***. No acute distress HEENT: Normal NECK: No JVD. LYMPHATICS: No lymphadenopathy CARDIAC: *** RRR without murmur, gallop, or edema. VASCULAR: *** Normal Pulses. No bruits. RESPIRATORY:  Clear to auscultation without rales, wheezing or rhonchi  ABDOMEN: Soft, non-tender, non-distended, No pulsatile mass, MUSCULOSKELETAL: No deformity  SKIN: Warm and dry NEUROLOGIC:  Alert and oriented x 3 PSYCHIATRIC:  Normal affect   ASSESSMENT:    1. Chronic systolic heart failure ([place])   2. ESRD (end stage renal disease) on dialysis ([name])   3. PVD (peripheral vascular disease)  ([name])   4. Uncontrolled type 2 diabetes mellitus with hyperglycemia ([name])   5. 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 Embolism, arterial ([name])   8. Educated about COVID-19 virus infection    PLAN:    In order of problems listed above:  1. ***   Medication Adjustments/Labs and Tests Ordered: Current medicines are reviewed at length with the patient today.  Concerns regarding medicines are outlined above.  No orders of the defined types were placed in this encounter.  No orders of the defined types were placed in this encounter.   There are no Patient Instructions on file for this visit.   Signed, Sinclair Grooms, MD  06/02/2019 9:47 PM    [place]

## 2019-06-04 ENCOUNTER — Encounter: Payer: Self-pay | Admitting: Interventional Cardiology

## 2019-06-04 ENCOUNTER — Ambulatory Visit: Payer: Medicare Other | Admitting: Interventional Cardiology

## 2019-06-04 ENCOUNTER — Ambulatory Visit (INDEPENDENT_AMBULATORY_CARE_PROVIDER_SITE_OTHER): Payer: Medicare Other | Admitting: Interventional Cardiology

## 2019-06-04 ENCOUNTER — Other Ambulatory Visit: Payer: Self-pay

## 2019-06-04 VITALS — BP 93/55 | HR 102 | Ht 65.0 in | Wt 244.6 lb

## 2019-06-04 DIAGNOSIS — I749 Embolism and thrombosis of unspecified artery: Secondary | ICD-10-CM | POA: Diagnosis not present

## 2019-06-04 DIAGNOSIS — I5022 Chronic systolic (congestive) heart failure: Secondary | ICD-10-CM | POA: Diagnosis not present

## 2019-06-04 DIAGNOSIS — I10 Essential (primary) hypertension: Secondary | ICD-10-CM

## 2019-06-04 DIAGNOSIS — Z7189 Other specified counseling: Secondary | ICD-10-CM

## 2019-06-04 DIAGNOSIS — Z992 Dependence on renal dialysis: Secondary | ICD-10-CM

## 2019-06-04 DIAGNOSIS — E1165 Type 2 diabetes mellitus with hyperglycemia: Secondary | ICD-10-CM

## 2019-06-04 DIAGNOSIS — I739 Peripheral vascular disease, unspecified: Secondary | ICD-10-CM

## 2019-06-04 DIAGNOSIS — N186 End stage renal disease: Secondary | ICD-10-CM

## 2019-06-04 DIAGNOSIS — I471 Supraventricular tachycardia: Secondary | ICD-10-CM | POA: Diagnosis not present

## 2019-06-04 NOTE — Progress Notes (Signed)
Cardiology Office Note:    Date:  06/04/2019   ID:  ARCHER MOIST, DOB 05-31-1965, MRN [id number]  PCP:  [name], MD  Cardiologist:  Sinclair Grooms, MD   Referring MD: [name], MD   Chief Complaint  Patient presents with  . Congestive Heart Failure  . Follow-up    End-stage kidney disease on dialysis  . Advice Only    Vasculitis with digital loss due to Raynaud's    History of Present Illness:    [name] [name] is a 54 y.o. 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  [name] [name] is in today and for the first time "he is beat down" by his disease process.  He has having difficulty with ambulation and standing.  Difficulty caring for himself including weakness that causes him to have concerned about taking showers for fear that he may fall.  Despite dialysis he has massive bilateral lower extremity edema and tells me that adenopathy has been noted in his pelvis that could be contributing due to compression.  He is not having chest pain or shortness of breath although if he goes to bed to sleep, he wakes up in 2 hours short of breath.  His legs significantly decreased in size when he lies down.  He is not having chest pain, palpitations, and has not had syncope although he has had episodes of dizziness.  He has having discomfort in in the digits of his hands.  He also has an issue going on with his penis and the foreskin with what sounds like balanitis.  He is having some type of debridement performed intermittently by urology.  Past Medical History:  Diagnosis Date  .  Anal infection    posterior anal canal  . Anemia, chronic renal failure   . Atrophic kidney    BILATERAL  . DM type 2 causing ESRD [facility])    Nephrologist-- dr [name] [facility])--  on hemodialysis since June 2012 at  [name]  TTS  . Hemodialysis patient [facility])    at [place] on Tues/ Thur/Sat/schedule  . Hemorrhoids   . Hepatitis B antibody positive   . History of pleural effusion    bilateral  . Hyperparathyroidism, secondary renal ([place])   . Hypertension   . Ischemic cardiomyopathy    per echo 07-01-2014  ef 45%  . LAFB (left anterior fascicular block)   . Peripheral neuropathy   . Systolic and diastolic CHF, chronic ([place])    CARDIOLOGIST-  DR Daneen Schick ([place])  AND DR [name] ([name])    Past Surgical History:  Procedure Laterality Date  . APPENDECTOMY  09-12-2004   laparotomy w/ drainage peritinitis  . AV FISTULA PLACEMENT  02-27-2010   right forearm (RADIOCEPHALIC)  . AV FISTULA REPAIR  10-30-2010  . CARDIOVASCULAR STRESS TEST  10-29-2011   dr Daneen Schick   Low risk scan;  mild perfusion defect seen in the basal inferoseptal, basal inferior and mid inferior regions consistent with an infarct/scar and/or overlying attenuation/  mild to moderate global LVSF,  ef 40-45%  . DOBUTAMINE STRESS ECHO  07-23-2012   [facility]  abnormal ;  at rest estimated lvef 25-30% and global severe LV hypokinesis ;  no cp during stress and achieved 85% maxium predicted heart rate;  negative stress ECG for inducible ischemia;  estimated lvef with stress 35-40%;  augmentation of wall segments consistant with cardiomyopathy and differential fibrosis  . FISTULOTOMY N/A 03/26/2015   Procedure: FISTULOTOMY;  Surgeon: [name], MD;  Location: [facility];  Service: General;  Laterality: N/A;  . INCISION AND DRAINAGE ABSCESS N/A 03/26/2015   Procedure: ANAL INCISION AND DRAINAGE;  Surgeon: [name], MD;  Location: [facility];  Service: General;  Laterality: N/A;  . RETINAL DETACHMENT SURGERY Left 2011   incomplete repair/ needs eye drops to keep pressure down  . TEE WITHOUT CARDIOVERSION N/A 10/17/2017   Procedure: TRANSESOPHAGEAL ECHOCARDIOGRAM (TEE);  Surgeon: Josue Hector, MD;  Location: [facility] ENDOSCOPY;  Service: Cardiovascular;  Laterality: N/A;  . TRANSTHORACIC ECHOCARDIOGRAM  07-01-2014    done at [facility]   grade 1 diastolic dysfunction,  ef 45%/  trace TR and PR    Current Medications: Current Meds  Medication Sig  . acetaminophen (TYLENOL) 500 MG tablet Take 1,000 mg by mouth as needed.  [name] allopurinol (ZYLOPRIM) 100 MG tablet Take 100 mg by mouth daily.  [name] aspirin 325 MG tablet Take 325 mg by mouth daily.  . calcium acetate (PHOSLO) 667 MG capsule Take 2,001 mg by mouth 3 (three) times daily with meals.   . celecoxib (CELEBREX) 100 MG capsule Take 100 mg by mouth daily.  . cyanocobalamin 1000 MCG tablet Take by mouth.  . cycloSPORINE (RESTASIS) 0.05 % ophthalmic emulsion Place 1 drop into both eyes 2 (two) times daily.  . febuxostat (ULORIC) 40 MG tablet Take 40 mg by mouth daily.   . ferric citrate (AURYXIA) 1 GM 210 MG(Fe) tablet Take 630 mg by mouth 3 (three) times daily.   [name] gabapentin (NEURONTIN) 300 MG capsule Take 600 mg by mouth as needed. For pain  . midodrine (PROAMATINE) 5 MG tablet Take 5 mg by mouth 3 (three) times a week. Every Tuesday Thursday and Saturday  . Multiple Vitamins-Minerals (MEGA MULTIVITAMIN FOR MEN PO) Take 1 tablet by mouth daily.   . NON FORMULARY humalog omnipod three times daily adjusted to food intake  . oxyCODONE-acetaminophen (PERCOCET) 10-325 MG tablet Take 1 tablet by mouth every 4 (four) hours as needed for pain.      Allergies:   Patient has no known allergies.   Social History   Socioeconomic History  . Marital status: Single    Spouse name: Not on file  . Number of children: Not on file  . Years of education: Not on  file  . Highest education level: Not on file  Occupational History  . Not on file  Tobacco Use  . Smoking status: Never Smoker  . Smokeless tobacco: Never Used  Substance and Sexual Activity  . Alcohol use: No  . Drug use: No  . Sexual activity: Not on file  Other Topics Concern  . Not on file  Social History Narrative  . Not on file   Social Determinants of Health   Financial Resource Strain:   . Difficulty of Paying Living Expenses:   Food Insecurity:   . Worried About Charity fundraiser in the Last Year:   . Arboriculturist in the Last Year:   Transportation Needs:   . Film/video editor (Medical):   [name] Lack of Transportation (  Non-Medical):   Physical Activity:   . Days of Exercise per Week:   . Minutes of Exercise per Session:   Stress:   . Feeling of Stress :   Social Connections:   . Frequency of Communication with Friends and Family:   . Frequency of Social Gatherings with Friends and Family:   . Attends Religious Services:   . Active Member of Clubs or Organizations:   . Attends Archivist Meetings:   [name] Marital Status:      Family History: The patient's Family history is unknown by patient.  ROS:   Please see the history of present illness.    Penile drainage, still has blanching of his hands.  Had an episode of rapid heart beating associated with hyper kalemia emergency room visit in all other systems reviewed and are negative.  EKGs/Labs/Other Studies Reviewed:    The following studies were reviewed today:  2D Doppler echocardiogram April 24, 2019: IMPRESSIONS    1. Left ventricular ejection fraction, by visual estimation, is 40 to  45%. The left ventricle has mildly decreased function. There is mildly  increased left ventricular hypertrophy.  2. Elevated left atrial and left ventricular end-diastolic pressures.  3. Left ventricular diastolic parameters are consistent with Grade II  diastolic dysfunction (pseudonormalization).    4. The left ventricle demonstrates global hypokinesis.  5. Global right ventricle was not well visualized.The right ventricular  size is not well visualized. Right vetricular wall thickness was not  assessed.  6. Left atrial size was normal.  7. Right atrial size was normal.  8. Mild mitral annular calcification.  9. The mitral valve is abnormal. Trivial mitral valve regurgitation.  10. The tricuspid valve is grossly normal.  11. The tricuspid valve is grossly normal. Tricuspid valve regurgitation  is mild.  12. Aortic valve mean gradient measures 9.0 mmHg.  13. Aortic valve peak gradient measures 17.3 mmHg.  14. The aortic valve is tricuspid. Aortic valve regurgitation is not  visualized. Mild aortic valve sclerosis without stenosis.  15. The pulmonic valve was not well visualized. Pulmonic valve  regurgitation is not visualized.  16. The interatrial septum was not well visualized.    EKG:  EKG May 25, 2019 demonstrates sinus rhythm with left posterior hemiblock.  ST-T wave changes noted.  An ECG is not repeated today.  Recent Labs: 04/12/2019: BUN 26; Creatinine, Ser 9.21; Hemoglobin 10.8; Platelets 329; Potassium 6.3; Sodium 139  Recent Lipid Panel No results found for: CHOL, TRIG, HDL, CHOLHDL, VLDL, LDLCALC, LDLDIRECT  Physical Exam:    VS:  BP (!) 93/55   Pulse (!) 102   Ht 5\' 5"  (1.651 m)   Wt 244 lb 9.6 oz (110.9 kg)   SpO2 94%   BMI 40.70 kg/m     Wt Readings from Last 3 Encounters:  06/04/19 244 lb 9.6 oz (110.9 kg)  05/25/19 242 lb (109.8 kg)  04/30/19 244 lb (110.7 kg)     GEN: Morbid obesity. No acute distress HEENT: Normal NECK: No JVD. LYMPHATICS: No lymphadenopathy CARDIAC:  RRR with 2 of 6 apical systolic murmur and a S3 gallop gallop, with tense bilateral lower extremity edema to above the knee.   VASCULAR:  Normal Pulses. No bruits. RESPIRATORY:  Clear to auscultation without rales, wheezing or rhonchi  ABDOMEN: Soft, non-tender,  non-distended, No pulsatile mass, MUSCULOSKELETAL: No deformity  SKIN: Warm and dry NEUROLOGIC:  Alert and oriented x 3 PSYCHIATRIC:  Normal affect   ASSESSMENT:    1. Chronic  systolic heart failure ([place])   2. ESRD (end stage renal disease) on dialysis ([place])   3. PVD (peripheral vascular disease) ([place])   4. SVT (supraventricular tachycardia) ([place])   5. Essential hypertension   6. Uncontrolled type 2 diabetes mellitus with hyperglycemia ([place])   7. CKD (chronic kidney disease) stage V requiring chronic dialysis ([place])   8. Educated about COVID-19 virus infection    PLAN:    In order of problems listed above:  1. The patient has marked volume overload in the lower extremities that is difficult to treat with dialysis because of chronic hypotension requiring midodrine therapy.  Because of low blood pressure, guideline directed therapy for systolic dysfunction has not been possible. 2. Dialysis is required.  Hypotension limits volume removal. 3. He has had multiple digits on his hands removed because of Raynaud's/vasculitis/atherosclerosis. 4. An episode of PSVT occurring in January 2021 was treated in the field by EMS with IV Identicard requiring a total of 12 mg.  No recurrence.  No prophylactic therapy due to chronic hypotension. 5. This was previously an issue.  Over the last several years severe hypotension has been a problem.  He is on no therapy that would lower the blood pressure and requires midodrine to facilitate complete dialysis. 67. Being followed by his primary physician and currently is on no specific therapy for diabetes. 7. Continue with hemodialysis as tolerated 8. Mask wearing, social distancing, and handwashing.  The COVID-19 vaccine is desired.  I had a frank conversation with Mr. Cornette today.  From the cardiovascular viewpoint, chronic low blood pressure prevents guideline directed therapy for systolic heart failure and atherosclerosis.  He has only therapy to manage  intravascular volume is dialysis.  Any other therapy that has a consequential lowering of blood pressure cannot be tolerated.  Furthermore there is a poor understanding of his acro-ischemia with digit loss.  He states Raynaud's disease is the diagnosis.  There is also concerned about the possibility of vasculitis.  He seems somewhat down today.  I encouraged him to keep fighting.  He has recently had a loss of a second brother due to diabetes and end-stage kidney disease complications.  He is concerned that his mother will be devastated if he succumbs anytime soon.  Plan to see him back before the end of the year or earlier if needed.   Medication Adjustments/Labs and Tests Ordered: Current medicines are reviewed at length with the patient today.  Concerns regarding medicines are outlined above.  No orders of the defined types were placed in this encounter.  No orders of the defined types were placed in this encounter.   Patient Instructions  Medication Instructions:  Your physician recommends that you continue on your current medications as directed. Please refer to the Current Medication list given to you today.  *If you need a refill on your cardiac medications before your next appointment, please call your pharmacy*   Lab Work: NoneNon If you have labs (blood work) drawn today and your tests are completely normal, you will receive your results only by: [name] MyChart Message (if you have MyChart) OR . A paper copy in the mail If you have any lab test that is abnormal or we need to change your treatment, we will call you to review the results.   Testing/Procedures: None   Follow-Up: At [facility], you and your health needs are our priority.  As part of our continuing mission to provide you with exceptional heart care, we have created designated  Provider Care Teams.  These Care Teams include your primary Cardiologist (physician) and Advanced Practice Providers (APPs -  Physician  Assistants and Nurse Practitioners) who all work together to provide you with the care you need, when you need it.  We recommend signing up for the patient portal called "MyChart".  Sign up information is provided on this After Visit Summary.  MyChart is used to connect with patients for Virtual Visits (Telemedicine).  Patients are able to view lab/test results, encounter notes, upcoming appointments, etc.  Non-urgent messages can be sent to your provider as well.   To learn more about what you can do with MyChart, go to [email or web address].    Your next appointment:   6-9 month(s)  The format for your next appointment:   In Person  Provider:   You may see Sinclair Grooms, MD or one of the following Advanced Practice Providers on your designated Care Team:    Truitt Merle, NP  Cecilie Kicks, NP  Kathyrn Drown, NP    Other Instructions      Signed, Sinclair Grooms, MD  06/04/2019 5:51 PM    [place]

## 2019-06-04 NOTE — Patient Instructions (Signed)
Medication Instructions:  Your physician recommends that you continue on your current medications as directed. Please refer to the Current Medication list given to you today.  *If you need a refill on your cardiac medications before your next appointment, please call your pharmacy*   Lab Work: NoneNon If you have labs (blood work) drawn today and your tests are completely normal, you will receive your results only by: [name] MyChart Message (if you have MyChart) OR . A paper copy in the mail If you have any lab test that is abnormal or we need to change your treatment, we will call you to review the results.   Testing/Procedures: None   Follow-Up: At [facility], you and your health needs are our priority.  As part of our continuing mission to provide you with exceptional heart care, we have created designated Provider Care Teams.  These Care Teams include your primary Cardiologist (physician) and Advanced Practice Providers (APPs -  Physician Assistants and Nurse Practitioners) who all work together to provide you with the care you need, when you need it.  We recommend signing up for the patient portal called "MyChart".  Sign up information is provided on this After Visit Summary.  MyChart is used to connect with patients for Virtual Visits (Telemedicine).  Patients are able to view lab/test results, encounter notes, upcoming appointments, etc.  Non-urgent messages can be sent to your provider as well.   To learn more about what you can do with MyChart, go to [email or web address].    Your next appointment:   6-9 month(s)  The format for your next appointment:   In Person  Provider:   You may see Sinclair Grooms, MD or one of the following Advanced Practice Providers on your designated Care Team:    Truitt Merle, NP  Cecilie Kicks, NP  Kathyrn Drown, NP    Other Instructions

## 2019-06-04 NOTE — Telephone Encounter (Signed)
error 

## 2019-06-04 NOTE — Progress Notes (Addendum)
KEEFE, ZAWISTOWSKI ([id number]) Visit Report for 05/30/2019 Arrival Information Details Patient Name: Date of Service: [name] [name], [name] [name] 05/30/2019 10:15 AM Medical Record [id number] Patient Account Number: [id number] Date of Birth/Sex: Treating RN: 1965-08-09 (54 y.o. [name] Primary Care [name]: [name] Other Clinician: Referring Jihaad Bruschi: Treating Destony Prevost/Extender:Stone III, Rhea Bleacher, Marylyn Ishihara in Treatment: 2 Visit Information History Since Last Visit Cane Added or deleted any medications: No Patient Arrived: 10:35 Any new allergies or adverse reactions: No Arrival Time: alone Had a fall or experienced change in No Accompanied By: None activities of daily living that may affect Transfer Assistance: risk of falls: Patient Identification Verified: Yes Signs or symptoms of abuse/neglect since last No Secondary Verification Process Completed: Yes visito Patient Requires Transmission-Based No Hospitalized since last visit: No Precautions: Implantable device outside of the clinic excluding No Patient Has Alerts: No cellular tissue based products placed in the center since last visit: Has Dressing in Place as Prescribed: Yes Pain Present Now: No Electronic Signature(s) Signed: 06/04/2019 5:44:18 PM By: Levan Hurst RN, BSN Entered By: Levan Hurst on 05/30/2019 10:36:24 -------------------------------------------------------------------------------- Encounter Discharge Information Details Patient Name: Date of Service: [name] [name] 05/30/2019 10:15 AM Medical Record [id number] Patient Account Number: [id number] Date of Birth/Sex: Treating RN: June 13, 1965 (53 y.o. Oval Linsey Primary Care Ryland Tungate: [name] Other Clinician: Referring Colen Eltzroth: Treating Saphronia Ozdemir/Extender:Stone III, Rhea Bleacher, Marylyn Ishihara in Treatment: 2 Encounter Discharge Information Items Post Procedure Vitals Discharge Condition:  Stable Temperature (F): 98 Ambulatory Status: Cane Pulse (bpm): 101 Discharge Destination: Home Respiratory Rate (breaths/min): 18 Transportation: Private Auto Blood Pressure (mmHg): 163/80 Accompanied By: self Schedule Follow-up Appointment: Yes Clinical Summary of Care: Patient Declined Electronic Signature(s) Signed: 05/30/2019 5:35:23 PM By: Carlene Coria RN Entered By: Carlene Coria on 05/30/2019 11:27:36 -------------------------------------------------------------------------------- [place] Details Patient Name: Date of Service: [name] [name] 05/30/2019 10:15 AM Medical Record [id number] Patient Account Number: [id number] Date of Birth/Sex: Treating RN: 1965/05/25 (54 y.o. [name] [name] Primary Care Jya Hughston: [name] Other Clinician: Referring Amal Renbarger: Treating Irean Kendricks/Extender:Stone III, Rhea Bleacher, Marylyn Ishihara in Treatment: 2 Active Inactive Abuse / Safety / Falls / Self Care Management Nursing Diagnoses: Potential for falls Goals: Patient/caregiver will verbalize/demonstrate measures taken to prevent injury and/or falls Date Initiated: 05/16/2019 Target Resolution Date: 06/13/2019 Goal Status: Active Interventions: Assess fall risk on admission and as needed Notes: Nutrition Nursing Diagnoses: Impaired glucose control: actual or potential Goals: Patient/caregiver will maintain therapeutic glucose control Date Initiated: 05/16/2019 Target Resolution Date: 06/13/2019 Goal Status: Active Interventions: Assess patient nutrition upon admission and as needed per policy Treatment Activities: Patient referred to Primary Care Physician for further nutritional evaluation : 05/16/2019 Notes: Wound/Skin Impairment Nursing Diagnoses: Impaired tissue integrity Knowledge deficit related to ulceration/compromised skin integrity Goals: Patient/caregiver will verbalize understanding of skin care regimen Date Initiated:  05/16/2019 Target Resolution Date: 06/13/2019 Goal Status: Active Ulcer/skin breakdown will have a volume reduction of 30% by week 4 Date Initiated: 05/16/2019 Target Resolution Date: 05/16/2019 Goal Status: Active Interventions: Assess patient/caregiver ability to obtain necessary supplies Assess patient/caregiver ability to perform ulcer/skin care regimen upon admission and as needed Assess ulceration(s) every visit Provide education on ulcer and skin care Treatment Activities: Skin care regimen initiated : 05/16/2019 Topical wound management initiated : 05/16/2019 Notes: Electronic Signature(s) Signed: 05/30/2019 5:43:44 PM By: Baruch Gouty RN, BSN Entered By: Baruch Gouty on 05/30/2019 11:06:44 -------------------------------------------------------------------------------- Pain Assessment Details Patient Name: Date of Service: [name] [name] 05/30/2019 10:15 AM Medical Record [id number] Patient Account Number: [id number]  Date of Birth/Sex: Treating RN: 10-08-1965 (54 y.o. [name] Primary Care Balin Vandegrift: [name] Other Clinician: Referring [name] [place]: Treating Jossilyn Benda/Extender:Stone III, Rhea Bleacher, Marylyn Ishihara in Treatment: 2 Active Problems Location of Pain Severity and Description of Pain Patient Has Paino No Site Locations Pain Management and Medication Current Pain Management: Electronic Signature(s) Signed: 06/04/2019 5:44:18 PM By: Levan Hurst RN, BSN Entered By: Levan Hurst on 05/30/2019 10:37:04 -------------------------------------------------------------------------------- Patient/Caregiver Education Details Patient Name: [name] [name] 3/10/2021andnbsp10:15 Date of Service: AM Medical Record [id number] Number: Patient Account Number: [id number] Treating RN: Date of Birth/Gender: 1965-05-26 (53 y.o. [name] [name]) Other Clinician: Primary Care Treating Kilpatrick, Lyda Jester Physician: Physician/Extender: Referring Physician: Ashley Jacobs in Treatment: 2 Education Assessment Education Provided To: Patient Education Topics Provided Wound/Skin Impairment: Methods: Explain/Verbal Responses: Reinforcements needed, State content correctly Electronic Signature(s) Signed: 05/30/2019 5:43:44 PM By: Baruch Gouty RN, BSN Entered By: Baruch Gouty on 05/30/2019 11:07:04 -------------------------------------------------------------------------------- Wound Assessment Details Patient Name: Date of Service: [name] [name] 05/30/2019 10:15 AM Medical Record [id number] Patient Account Number: [id number] Date of Birth/Sex: Treating RN: 10-09-1965 (54 y.o. [name] [name] Primary Care Bryony Kaman: [name] Other Clinician: Referring Chibueze Beasley: Treating Mckynleigh Mussell/Extender:Stone III, Rhea Bleacher, Marylyn Ishihara in Treatment: 2 Wound Status Wound Number: 1 Primary Atypical Etiology: Wound Location: Penis Wound Open Wounding Event: Gradually Appeared Status: Date Acquired: 01/21/2019 Comorbid Anemia, Congestive Heart Failure, Weeks Of Treatment: 2 History: Hypertension, Type II Diabetes, End Stage Clustered Wound: No Renal Disease, Raynauds, Neuropathy Photos Wound Measurements Length: (cm) 2 % Reduct Width: (cm) 1.8 % Reduct Depth: (cm) 0.1 Epitheli Area: (cm) 2.827 Tunneli Volume: (cm) 0.283 Undermi Wound Description Full Thickness Without Exposed Support Foul Od Classification: Structures Slough/ Wound Flat and Intact Margin: Exudate Medium Amount: Exudate Serosanguineous Type: Exudate red, brown Color: Wound Bed Granulation Amount: Medium (34-66%) Granulation Quality: Pink, Pale Fascia E Necrotic Amount: Medium (34-66%) Fat Laye Necrotic Quality: Adherent Slough Tendon E Muscle E Joint Ex Bone Expo or After Cleansing: No Fibrino Yes Exposed Structure xposed: No r (Subcutaneous Tissue) Exposed:  Yes xposed: No xposed: No posed: No sed: No ion in Area: 42.4% ion in Volume: 71.2% alization: Small (1-33%) ng: No ning: No Treatment Notes Wound #1 (Penis) 1. Cleanse With Wound Cleanser 3. Primary Dressing Applied Santyl 4. Secondary Dressing Dry Gauze Notes normal saline moist gauze for wet to dry Electronic Signature(s) Signed: 06/05/2019 4:45:30 PM By: Mikeal Hawthorne EMT/HBOT Signed: 06/05/2019 5:21:02 PM By: Baruch Gouty RN, BSN Previous Signature: 06/04/2019 5:44:18 PM Version By: Levan Hurst RN, BSN Entered By: Mikeal Hawthorne on 06/05/2019 15:48:37 -------------------------------------------------------------------------------- [place] Details Patient Name: Date of Service: [name] [name], [name] [name] 05/30/2019 10:15 AM Medical Record [id number] Patient Account Number: [id number] Date of Birth/Sex: Treating RN: Jul 03, 1965 (54 y.o. [name] Primary Care Ramell Wacha: [name] Other Clinician: Referring Manda Holstad: Treating Phil Corti/Extender:Stone III, Rhea Bleacher, Marylyn Ishihara in Treatment: 2 Vital Signs Time Taken: 10:38 Temperature (F): 98.0 Height (in): 65 Pulse (bpm): 101 Weight (lbs): 240 Respiratory Rate (breaths/min): 18 Body Mass Index (BMI): 39.9 Blood Pressure (mmHg): 111/70 Capillary Blood Glucose (mg/dl): 163 Reference Range: 80 - 120 mg / dl Notes glucose per pt report Electronic Signature(s) Signed: 06/04/2019 5:44:18 PM By: Levan Hurst RN, BSN Entered By: Levan Hurst on 05/30/2019 10:40:37

## 2019-06-05 DIAGNOSIS — D631 Anemia in chronic kidney disease: Secondary | ICD-10-CM | POA: Diagnosis not present

## 2019-06-05 DIAGNOSIS — N186 End stage renal disease: Secondary | ICD-10-CM | POA: Diagnosis not present

## 2019-06-05 DIAGNOSIS — N2581 Secondary hyperparathyroidism of renal origin: Secondary | ICD-10-CM | POA: Diagnosis not present

## 2019-06-05 DIAGNOSIS — D509 Iron deficiency anemia, unspecified: Secondary | ICD-10-CM | POA: Diagnosis not present

## 2019-06-06 DIAGNOSIS — H2513 Age-related nuclear cataract, bilateral: Secondary | ICD-10-CM | POA: Diagnosis not present

## 2019-06-06 DIAGNOSIS — H401111 Primary open-angle glaucoma, right eye, mild stage: Secondary | ICD-10-CM | POA: Diagnosis not present

## 2019-06-06 DIAGNOSIS — H16223 Keratoconjunctivitis sicca, not specified as Sjogren's, bilateral: Secondary | ICD-10-CM | POA: Diagnosis not present

## 2019-06-07 DIAGNOSIS — N186 End stage renal disease: Secondary | ICD-10-CM | POA: Diagnosis not present

## 2019-06-07 DIAGNOSIS — N2581 Secondary hyperparathyroidism of renal origin: Secondary | ICD-10-CM | POA: Diagnosis not present

## 2019-06-07 DIAGNOSIS — D509 Iron deficiency anemia, unspecified: Secondary | ICD-10-CM | POA: Diagnosis not present

## 2019-06-07 DIAGNOSIS — D631 Anemia in chronic kidney disease: Secondary | ICD-10-CM | POA: Diagnosis not present

## 2019-06-09 DIAGNOSIS — D509 Iron deficiency anemia, unspecified: Secondary | ICD-10-CM | POA: Diagnosis not present

## 2019-06-09 DIAGNOSIS — N186 End stage renal disease: Secondary | ICD-10-CM | POA: Diagnosis not present

## 2019-06-09 DIAGNOSIS — D631 Anemia in chronic kidney disease: Secondary | ICD-10-CM | POA: Diagnosis not present

## 2019-06-09 DIAGNOSIS — N2581 Secondary hyperparathyroidism of renal origin: Secondary | ICD-10-CM | POA: Diagnosis not present

## 2019-06-12 DIAGNOSIS — N2581 Secondary hyperparathyroidism of renal origin: Secondary | ICD-10-CM | POA: Diagnosis not present

## 2019-06-12 DIAGNOSIS — D509 Iron deficiency anemia, unspecified: Secondary | ICD-10-CM | POA: Diagnosis not present

## 2019-06-12 DIAGNOSIS — N186 End stage renal disease: Secondary | ICD-10-CM | POA: Diagnosis not present

## 2019-06-12 DIAGNOSIS — D631 Anemia in chronic kidney disease: Secondary | ICD-10-CM | POA: Diagnosis not present

## 2019-06-13 ENCOUNTER — Encounter (HOSPITAL_BASED_OUTPATIENT_CLINIC_OR_DEPARTMENT_OTHER): Payer: Medicare Other | Admitting: Physician Assistant

## 2019-06-13 ENCOUNTER — Other Ambulatory Visit: Payer: Self-pay

## 2019-06-13 ENCOUNTER — Other Ambulatory Visit (HOSPITAL_COMMUNITY)
Admission: RE | Admit: 2019-06-13 | Discharge: 2019-06-13 | Disposition: A | Discharge status: Home / Self Care | Payer: Medicare Other | Source: Other Acute Inpatient Hospital | Attending: Physician Assistant | Admitting: Physician Assistant

## 2019-06-13 DIAGNOSIS — E1169 Type 2 diabetes mellitus with other specified complication: Secondary | ICD-10-CM | POA: Diagnosis not present

## 2019-06-13 DIAGNOSIS — N4829 Other inflammatory disorders of penis: Secondary | ICD-10-CM | POA: Insufficient documentation

## 2019-06-13 DIAGNOSIS — E1122 Type 2 diabetes mellitus with diabetic chronic kidney disease: Secondary | ICD-10-CM | POA: Diagnosis not present

## 2019-06-13 DIAGNOSIS — N186 End stage renal disease: Secondary | ICD-10-CM | POA: Diagnosis not present

## 2019-06-13 DIAGNOSIS — E1140 Type 2 diabetes mellitus with diabetic neuropathy, unspecified: Secondary | ICD-10-CM | POA: Diagnosis not present

## 2019-06-13 DIAGNOSIS — Z992 Dependence on renal dialysis: Secondary | ICD-10-CM | POA: Diagnosis not present

## 2019-06-13 DIAGNOSIS — E11622 Type 2 diabetes mellitus with other skin ulcer: Secondary | ICD-10-CM | POA: Diagnosis not present

## 2019-06-13 DIAGNOSIS — I120 Hypertensive chronic kidney disease with stage 5 chronic kidney disease or end stage renal disease: Secondary | ICD-10-CM | POA: Diagnosis not present

## 2019-06-13 DIAGNOSIS — N485 Ulcer of penis: Secondary | ICD-10-CM | POA: Diagnosis not present

## 2019-06-13 NOTE — Progress Notes (Addendum)
[name] [name], [name] [name] ([id number]) Visit Report for 06/13/2019 Chief Complaint Document Details Patient Name: Date of Service: [name] [name], [name] [name] 06/13/2019 10:15 AM Medical Record [id number] Patient Account Number: [id number] Date of Birth/Sex: Treating RN: [birth year] (54 y.o. [name] [name] Primary Care Provider: [name] Other Clinician: Referring Provider: Treating Provider/Extender:Stone III, Rhea Bleacher, Marylyn Ishihara in Treatment: 4 Information Obtained from: Patient Chief Complaint Penis Ulcer Electronic Signature(s) Signed: 06/13/2019 10:25:51 AM By: Worthy Keeler PA-C Entered By: Worthy Keeler on 06/13/2019 10:25:51 -------------------------------------------------------------------------------- Debridement Details Patient Name: Date of Service: [name] [name], [name] [name] 06/13/2019 10:15 AM Medical Record [id number] Patient Account Number: [id number] Date of Birth/Sex: February 18, 1966 (53 y.o. M) Treating RN: Baruch Gouty Primary Care Provider: [name] Other Clinician: Referring Provider: Treating Provider/Extender:Stone III, Rhea Bleacher, Marylyn Ishihara in Treatment: 4 Debridement Performed for Wound #1 Penis Assessment: Performed By: Jake Church, RN Debridement Type: Chemical/Enzymatic/Mechanical Agent Used: Santyl Level of Consciousness (Pre- Awake and Alert procedure): Pre-procedure Verification/Time Out Taken: Yes - 11:45 Bleeding: None Response to Treatment: Procedure was tolerated well Level of Consciousness Awake and Alert (Post-procedure): Post Debridement Measurements of Total Wound Length: (cm) 2 Width: (cm) 3.2 Depth: (cm) 0.1 Volume: (cm) 0.503 Character of Wound/Ulcer Post Requires Further Debridement Debridement: Post Procedure Diagnosis Same as Pre-procedure Electronic Signature(s) Signed: 06/13/2019 5:14:16 PM By: Worthy Keeler PA-C Signed: 06/13/2019 5:36:50 PM By: Baruch Gouty RN, BSN Entered  By: Baruch Gouty on 06/13/2019 11:49:09 -------------------------------------------------------------------------------- HPI Details Patient Name: Date of Service: [name] [name] 06/13/2019 10:15 AM Medical Record [id number] Patient Account Number: [id number] Date of Birth/Sex: Treating RN: September 08, 1965 (54 y.o. [name] [name] Primary Care Provider: [name] Other Clinician: Referring Provider: Treating Provider/Extender:Stone III, Rhea Bleacher, Marylyn Ishihara in Treatment: 4 History of Present Illness HPI Description: 05/16/2019 patient presents today for initial evaluation here in our clinic concerning an issue he has been having with a wound on the dorsal surface of the distal portion of his penis. This has been present he tells me for about 2 months I do have a note from the urologist which actually states 4 months. 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 With that being said the patient has also been seen by his primary care provider he tells [place] that he has been screened for sexually transmitted diseases including syphilis although we did contact his primary care provider as well who states that they did not have any testing on him as such. Urology also did not perform any testing. The patient tells me he has not been sexually active since 2014 that he has not had any ability to do so. He is on dialysis and unfortunately is having a lot of issues as far as sexual dysfunction is concerned. 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 He tells me that the ulcer came out first as a bump on the end of his penis which was tender to touch. He continue to watch this and unfortunately as things progressed he states that it eventually opened, ulcerated, and  has been extremely painful. He really does not urinate secondary to being on dialysis and his end- stage renal disease. He does have diabetes mellitus type 2. 05/23/2019 upon evaluation today patient's wound on his penis actually appears to be doing better compared to last  week's evaluation. Unfortunately he still is having significant pain but there is really not necrotic tissue noted as badly as it was at the last visit. Overall I am pleased in this regard. We still have not been able to get the result back from the RPR testing that he had done at [place]. We have called them throughout the day today and we have not been able to get to talk with anyone yet. Obviously as soon as we get the results of this we will have to likely give them a call not to make him stay here all day. 05/30/2019 upon evaluation today fortunately we finally got the patient's RPR test back and it was negative. With that being said he is continuing to have some issues with pain. No fevers, chills, nausea, vomiting, or diarrhea. Overall I do feel like the wound is making good progress however 06/13/2019 upon evaluation today patient unfortunately appears to be doing more poorly in regard to the wound at this point. He has several what appear to be small vesicular lesions surrounding the glans of the penis on the foreskin. Unfortunately these are also somewhat painful. Fortunately there is no signs of systemic infection though I am more concerned at this point especially about infection. I am going to attempt to rupture and collect samples to send for culture today. Electronic Signature(s) Signed: 06/13/2019 11:51:29 AM By: Worthy Keeler PA-C Entered By: Worthy Keeler on 06/13/2019 11:51:29 -------------------------------------------------------------------------------- Physical Exam Details Patient Name: Date of Service: [name] [name], [name] [name] 06/13/2019 10:15 AM Medical Record [id number] Patient Account Number:  [id number] Date of Birth/Sex: Treating RN: June 29, 1965 (54 y.o. [name] [name] Primary Care Provider: [name] Other Clinician: Referring Provider: Treating Provider/Extender:Stone III, Rhea Bleacher, Marylyn Ishihara in Treatment: 4 Constitutional Well-nourished and well-hydrated in no acute distress. Respiratory normal breathing without difficulty. Psychiatric this patient is able to make decisions and demonstrates good insight into disease process. Alert and Oriented x 3. pleasant and cooperative. Notes Patient's wound again appear to be doing more poorly with regard to the blisterlike lesions around I did use a 25- gauge needle to rupture to the blisters in order to obtain samples for culture. There really was not much fluid underneath when these were ruptured. Subsequently I am becoming more concerned about an infectious type process am not exactly sure what may be causing this. I think that we may want to get infectious disease involved to see if there is anything they could recommend or suggest based on what is being seen as far as pinpointing exactly what is going on. Right now I am being more reactive with what we are doing treatment wise I really do not have a proactive plan based on what is going on because again I am really not sure what the etiology of this issue is. Electronic Signature(s) Signed: 06/13/2019 11:52:21 AM By: Worthy Keeler PA-C Entered By: Worthy Keeler on 06/13/2019 11:52:21 -------------------------------------------------------------------------------- Physician Orders Details Patient Name: Date of Service: [name] [name], [name] [name] 06/13/2019 10:15 AM Medical Record [id number] Patient Account Number: [id number] Date of Birth/Sex: Treating RN: [birth year] (54 y.o. [name] [name] Primary Care Provider: [name] Other Clinician: Referring Provider: Treating Provider/Extender:Stone III, Rhea Bleacher, Marylyn Ishihara in  Treatment: 4 Verbal / Phone Orders: No Diagnosis Coding ICD-10 Coding Code Description N48.5 Ulcer of penis N18.6 End stage renal disease Z99.2 Dependence on renal dialysis E11.622 Type 2 diabetes mellitus with other skin ulcer Follow-up Appointments Return Appointment in 2 weeks. Dressing Change Frequency  Wound #1 Penis Change dressing every day. Wound Cleansing Wound #1 Penis May shower and wash wound with soap and water. Primary Wound Dressing Wound #1 Penis Santyl Ointment Secondary Dressing Wound #1 Penis Kerlix/Rolled Gauze Other: - saline moistened gauze over santyl Consults Infectious Disease - abnormal lesions to penis - (ICD10 N48.5 - Ulcer of penis) Laboratory Bacteria identified in Unspecified specimen by Anaerobe culture (MICRO) - penis LOINC Code: 170-0 Convenience Name: Anerobic culture Electronic Signature(s) Signed: 06/13/2019 5:14:16 PM By: Worthy Keeler PA-C Signed: 06/13/2019 5:36:50 PM By: Baruch Gouty RN, BSN Entered By: Baruch Gouty on 06/13/2019 11:47:50 -------------------------------------------------------------------------------- Prescription 06/13/2019 Patient Name: [name] Mody L. Provider: Worthy Keeler PA Date of Birth: 1966/03/10 NPI#: [id number] Sex: Valeta Harms: [id number] Phone #: [phone] License #: Patient Address: [address] [place], [place] [address] [place], [place] [address] [phone] Allergies No Known Drug Allergies Provider's Orders Infectious Disease - ICD10: N48.5 - abnormal lesions to penis Signature(s): Date(s): Electronic Signature(s) Signed: 06/13/2019 5:14:16 PM By: Worthy Keeler PA-C Signed: 06/13/2019 5:36:50 PM By: Baruch Gouty RN, BSN Entered By: Baruch Gouty on 06/13/2019 11:47:51 --------------------------------------------------------------------------------  Problem List Details Patient Name: Date of  Service: [name] [name] 06/13/2019 10:15 AM Medical Record [id number] Patient Account Number: [id number] Date of Birth/Sex: Treating RN: [birth year] (54 y.o. [name] [name] Primary Care Provider: [name] Other Clinician: Referring Provider: Treating Provider/Extender:Stone III, Rhea Bleacher, Marylyn Ishihara in Treatment: 4 Active Problems ICD-10 Evaluated Encounter Code Description Active Date Today Diagnosis N48.5 Ulcer of penis 05/16/2019 No Yes N18.6 End stage renal disease 05/16/2019 No Yes Z99.2 Dependence on renal dialysis 05/16/2019 No Yes E11.622 Type 2 diabetes mellitus with other skin ulcer 05/16/2019 No Yes Inactive Problems Resolved Problems Electronic Signature(s) Signed: 06/13/2019 10:25:45 AM By: Worthy Keeler PA-C Entered By: Worthy Keeler on 06/13/2019 10:25:44 -------------------------------------------------------------------------------- Progress Note Details Patient Name: Date of Service: [name] [name], [name] [name] 06/13/2019 10:15 AM Medical Record [id number] Patient Account Number: [id number] Date of Birth/Sex: Treating RN: 12-11-1965 (54 y.o. [name] [name] Primary Care Provider: [name] Other Clinician: Referring Provider: Treating Provider/Extender:Stone III, Rhea Bleacher, Marylyn Ishihara in Treatment: 4 Subjective Chief Complaint Information obtained from Patient Penis Ulcer History of Present Illness (HPI) 05/16/2019 patient presents today for initial evaluation here in our clinic concerning an issue he has been having with a wound on the dorsal surface of the distal portion of his penis. This has been present he tells me for about 2 months I do have a note from the urologist which actually states 4 months. 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 With that being said the patient has also been seen by his  primary care provider he tells [place] that he has been screened for sexually transmitted diseases including syphilis although we did contact his primary care provider as well who states that they did not have any testing on him as such. Urology also did not perform any testing. The patient tells me he has not been sexually active since 2014 that he has not had any ability to do so. He is on dialysis and unfortunately is having a lot of issues as far as sexual dysfunction is concerned. 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 He tells me that the ulcer came out first as a bump on the end of his penis which was tender to touch. He continue to watch this and unfortunately as things progressed he states that it eventually opened, ulcerated, and has been extremely painful. He really does not urinate secondary to being on dialysis and his end-stage renal disease. He does have diabetes mellitus type 2. 05/23/2019 upon evaluation today patient's wound on his penis actually appears to be doing better compared to last week's evaluation. Unfortunately he still is having significant pain but there is really not necrotic tissue noted as badly as it was at the last visit. Overall I am pleased in this regard. We still have not been able to get the result back from the RPR testing that he had done at [place]. We have called them throughout the day today and we have not been able to get to talk with anyone yet. Obviously as soon as we get the results of this we will have to likely give them a call not to make him stay here all day. 05/30/2019 upon evaluation today fortunately we finally got the patient's RPR test back and it was negative. With that being said he is continuing to have some issues with pain. No fevers, chills, nausea, vomiting, or diarrhea. Overall I do feel like the wound is making good progress  however 06/13/2019 upon evaluation today patient unfortunately appears to be doing more poorly in regard to the wound at this point. He has several what appear to be small vesicular lesions surrounding the glans of the penis on the foreskin. Unfortunately these are also somewhat painful. Fortunately there is no signs of systemic infection though I am more concerned at this point especially about infection. I am going to attempt to rupture and collect samples to send for culture today. Objective Constitutional Well-nourished and well-hydrated in no acute distress. Vitals Time Taken: 10:40 AM, Height: 65 in, Weight: 240 lbs, BMI: 39.9, Temperature: 98.4 F, Pulse: 103 bpm, Respiratory Rate: 20 breaths/min, Blood Pressure: 98/44 mmHg, Capillary Blood Glucose: 201 mg/dl. Respiratory normal breathing without difficulty. Psychiatric this patient is able to make decisions and demonstrates good insight into disease process. Alert and Oriented x 3. pleasant and cooperative. General Notes: Patient's wound again appear to be doing more poorly with regard to the blisterlike lesions around I did use a 25-gauge needle to rupture to the blisters in order to obtain samples for culture. There really was not much fluid underneath when these were ruptured. Subsequently I am becoming more concerned about an infectious type process am not exactly sure what may be causing this. I think that we may want to get infectious disease involved to see if there is anything they could recommend or suggest based on what is being seen as far as pinpointing exactly what is going on. Right now I am being more reactive with what we are doing treatment wise I really do not have a proactive plan based on what is going on because again I am really not sure what the etiology of this issue is. Integumentary (Hair, Skin) Wound #1 status is Open. Original cause of wound was Gradually Appeared. The wound is located on the Penis. The  wound measures 2cm length x 3.2cm width x 0.1cm depth; 5.027cm^2 area and 0.503cm^3 volume. There is Fat Layer (Subcutaneous Tissue) Exposed exposed. There is no tunneling or undermining noted. There is a medium amount of serosanguineous drainage noted. The wound margin  is flat and intact. There is large (67-100%) pink, pale granulation within the wound bed. There is a small (1-33%) amount of necrotic tissue within the wound bed including Adherent Slough. Assessment Active Problems ICD-10 Ulcer of penis End stage renal disease Dependence on renal dialysis Type 2 diabetes mellitus with other skin ulcer Procedures Wound #1 Pre-procedure diagnosis of Wound #1 is an Atypical located on the Penis . There was a Chemical/Enzymatic/Mechanical debridement performed by [name] Coria, RN.[name] Agent used was [employer]. A time out was conducted at 11:45, prior to the start of the procedure. There was no bleeding. The procedure was tolerated well. Post Debridement Measurements: 2cm length x 3.2cm width x 0.1cm depth; 0.503cm^3 volume. Character of Wound/Ulcer Post Debridement requires further debridement. Post procedure Diagnosis Wound #1: Same as Pre-Procedure Plan Follow-up Appointments: Return Appointment in 2 weeks. Dressing Change Frequency: Wound #1 Penis: Change dressing every day. Wound Cleansing: Wound #1 Penis: May shower and wash wound with soap and water. Primary Wound Dressing: Wound #1 Penis: Santyl Ointment Secondary Dressing: Wound #1 Penis: Kerlix/Rolled Gauze Other: - saline moistened gauze over santyl Consults ordered were: Infectious Disease - abnormal lesions to penis Laboratory ordered were: Anerobic culture - penis 1. I would recommend that we continue with the Santyl for the time being. That is helping to clean up at least the necrotic tissue on the distal portion of the glans. 2. I am going to send a culture today which was obtained as best I could. 3. I am also going  to recommend that we go ahead and continue with cleaning the area with the dressing change daily as the patient has been doing. 4. I am to make a referral to infectious disease to obtain their opinion on what may be going on. We really need to determine an etiology of the issue occurring here in order to come up with a good treatment plan and I feel like there is something infectious occurring at this point. I am just unsure what the etiology is. 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 We will see patient back for reevaluation in 1 week here in the clinic. If anything worsens or changes patient will contact our office for additional recommendations. Electronic Signature(s) Signed: 06/13/2019 11:53:39 AM By: Worthy Keeler PA-C Entered By: Worthy Keeler on 06/13/2019 11:53:38 -------------------------------------------------------------------------------- SuperBill Details Patient Name: Date of Service: BIRL, LOBELLO 06/13/2019 Medical Record [id number] Patient Account Number: [id number] Date of Birth/Sex: Treating RN: 02-24-1966 (54 y.o. [name] [name] Primary Care Provider: [name] Other Clinician: Referring Provider: Treating Provider/Extender:Stone III, Rhea Bleacher, Marylyn Ishihara in Treatment: 4 Diagnosis Coding ICD-10 Codes Code Description N48.5 Ulcer of penis N18.6 End stage renal disease Z99.2 Dependence on renal dialysis E11.622 Type 2 diabetes mellitus with other skin ulcer Facility Procedures CPT4 Code: [id number] Description: [phone] - DEBRIDE W/O ANES NON SELECT Modifier: Quantity: 1 Physician Procedures CPT4 Code: [id number] Description: 01027 - WC PHYS LEVEL 4 - EST PT ICD-10 Diagnosis Description N48.5 Ulcer of penis N18.6 End stage renal disease Z99.2 Dependence on  renal dialysis E11.622 Type 2 diabetes mellitus with other skin ulcer Modifier: Quantity: 1 Electronic Signature(s) Signed: 06/13/2019 11:53:54 AM By: Worthy Keeler PA-C Entered By: Worthy Keeler on 06/13/2019 11:53:54

## 2019-06-13 NOTE — Progress Notes (Signed)
RAGNAR, WAAS ([id number]) Visit Report for 06/13/2019 Arrival Information Details Patient Name: Date of Service: MARDY, LUCIER 06/13/2019 10:15 AM Medical Record [id number] Patient Account Number: [id number] Date of Birth/Sex: Treating RN: 06-17-1965 (54 y.o. Lorette Ang, Meta.Reding Primary Care Natilee Gauer: [name] Other Clinician: Referring Diksha Tagliaferro: Treating Tyona Nilsen/Extender:Stone III, Rhea Bleacher, Marylyn Ishihara in Treatment: 4 Visit Information History Since Last Visit Added or deleted any medications: No Patient Arrived: Ambulatory Any new allergies or adverse reactions: No Arrival Time: 10:40 Had a fall or experienced change in No Accompanied By: self activities of daily living that may affect Transfer Assistance: None risk of falls: Patient Identification Verified: Yes Signs or symptoms of abuse/neglect since last No Secondary Verification Process Yes visito Completed: Hospitalized since last visit: No Patient Requires Transmission-Based No Implantable device outside of the clinic excluding No Precautions: cellular tissue based products placed in the center Patient Has Alerts: No since last visit: Has Dressing in Place as Prescribed: Yes Pain Present Now: No Notes noted white fluid filled circular areas noted to periwound on penis. x5 areas counted. Electronic Signature(s) Signed: 06/13/2019 5:22:14 PM By: Deon Pilling Entered By: Deon Pilling on 06/13/2019 10:47:39 -------------------------------------------------------------------------------- Encounter Discharge Information Details Patient Name: Date of Service: CALDEN, DORSEY 06/13/2019 10:15 AM Medical Record [id number] Patient Account Number: [id number] Date of Birth/Sex: Treating RN: [birth year] (53 y.o. Oval Linsey Primary Care Jabria Loos: [name] Other Clinician: Referring Wilferd Ritson: Treating Legend Pecore/Extender:Stone III, Rhea Bleacher, Marylyn Ishihara in  Treatment: 4 Encounter Discharge Information Items Post Procedure Vitals Discharge Condition: Stable Temperature (F): 98.4 Ambulatory Status: Cane Pulse (bpm): 103 Discharge Destination: Home Respiratory Rate (breaths/min): 20 Transportation: Private Auto Blood Pressure (mmHg): 98/44 Accompanied By: self Schedule Follow-up Appointment: Yes Clinical Summary of Care: Patient Declined Electronic Signature(s) Signed: 06/13/2019 5:15:50 PM By: Carlene Coria RN Entered By: Carlene Coria on 06/13/2019 12:02:57 -------------------------------------------------------------------------------- Lower Extremity Assessment Details Patient Name: Date of Service: BRADLEY, BOSTELMAN 06/13/2019 10:15 AM Medical Record [id number] Patient Account Number: [id number] Date of Birth/Sex: Treating RN: 1965-07-05 (54 y.o. Hessie Diener Primary Care Maylie Ashton: [name] Other Clinician: Referring Zafir Schauer: Treating Dayannara Pascal/Extender:Stone III, Rhea Bleacher, Marylyn Ishihara in Treatment: 4 Electronic Signature(s) Signed: 06/13/2019 5:22:14 PM By: Deon Pilling Entered By: Deon Pilling on 06/13/2019 10:42:15 -------------------------------------------------------------------------------- [place] Details Patient Name: Date of Service: ZAEDEN, LASTINGER 06/13/2019 10:15 AM Medical Record [id number] Patient Account Number: [id number] Date of Birth/Sex: Treating RN: 03-01-1966 (54 y.o. [name] Primary Care Detrell Umscheid: [name] Other Clinician: Referring Maisyn Nouri: Treating Edgar Reisz/Extender:Stone III, Rhea Bleacher, Marylyn Ishihara in Treatment: 4 Active Inactive Nutrition Nursing Diagnoses: Impaired glucose control: actual or potential Goals: Patient/caregiver will maintain therapeutic glucose control Date Initiated: 05/16/2019 Target Resolution Date: 07/11/2019 Goal Status: Active Interventions: Assess patient nutrition upon admission and as  needed per policy Treatment Activities: Patient referred to Primary Care Physician for further nutritional evaluation : 05/16/2019 Notes: Wound/Skin Impairment Nursing Diagnoses: Impaired tissue integrity Knowledge deficit related to ulceration/compromised skin integrity Goals: Patient/caregiver will verbalize understanding of skin care regimen Date Initiated: 05/16/2019 Target Resolution Date: 07/11/2019 Goal Status: Active Ulcer/skin breakdown will have a volume reduction of 30% by week 4 Date Initiated: 05/16/2019 Date Inactivated: 06/13/2019 Target Resolution Date: 05/16/2019 Goal Status: Met Ulcer/skin breakdown will have a volume reduction of 50% by week 8 Date Initiated: 06/13/2019 Target Resolution Date: 07/11/2019 Goal Status: Active Interventions: Assess patient/caregiver ability to obtain necessary supplies Assess patient/caregiver ability to perform ulcer/skin care regimen upon admission and as needed Assess ulceration(s) every visit  Provide education on ulcer and skin care Treatment Activities: Skin care regimen initiated : 05/16/2019 Topical wound management initiated : 05/16/2019 Notes: Electronic Signature(s) Signed: 06/13/2019 5:36:50 PM By: Baruch Gouty RN, BSN Entered By: Baruch Gouty on 06/13/2019 11:15:03 -------------------------------------------------------------------------------- Pain Assessment Details Patient Name: Date of Service: CASPER, PAGLIUCA 06/13/2019 10:15 AM Medical Record [id number] Patient Account Number: [id number] Date of Birth/Sex: Treating RN: Dec 24, 1965 (54 y.o. Hessie Diener Primary Care Adelis Docter: [name] Other Clinician: Referring Knox Cervi: Treating Douglas Rooks/Extender:Stone III, Rhea Bleacher, Marylyn Ishihara in Treatment: 4 Active Problems Location of Pain Severity and Description of Pain Patient Has Paino No Site Locations Rate the pain. Current Pain Level: 0 Pain Management and Medication Current Pain  Management: Medication: No Cold Application: No Rest: No Massage: No Activity: No T.E.N.S.: No Heat Application: No Leg drop or elevation: No Is the Current Pain Management Adequate: Adequate How does your wound impact your activities of daily livingo Sleep: No Bathing: No Appetite: No Relationship With Others: No Bladder Continence: No Emotions: No Bowel Continence: No Work: No Toileting: No Drive: No Dressing: No Hobbies: No Electronic Signature(s) Signed: 06/13/2019 5:22:14 PM By: Deon Pilling Entered By: Deon Pilling on 06/13/2019 10:42:11 -------------------------------------------------------------------------------- Patient/Caregiver Education Details Patient Name: [name] 3/24/2021andnbsp10:15 Date of Service: AM Medical Record [id number] Number: Patient Account Number: [id number] Treating RN: Date of Birth/Gender: [birth year] (54 y.o. [name]) Other Clinician: Primary Care Treating Kilpatrick, Lyda Jester Physician: Physician/Extender: Referring Physician: Ashley Jacobs in Treatment: 4 Education Assessment Education Provided To: Patient Education Topics Provided Infection: Methods: Explain/Verbal Responses: Reinforcements needed, State content correctly Wound/Skin Impairment: Methods: Explain/Verbal Responses: Reinforcements needed, State content correctly Electronic Signature(s) Signed: 06/13/2019 5:36:50 PM By: Baruch Gouty RN, BSN Entered By: Baruch Gouty on 06/13/2019 11:15:29 -------------------------------------------------------------------------------- Wound Assessment Details Patient Name: Date of Service: [name] 06/13/2019 10:15 AM Medical Record [id number] Patient Account Number: [id number] Date of Birth/Sex: Treating RN: Sep 16, 1965 (54 y.o. Lorette Ang, Meta.Reding Primary Care Sheyann Sulton: [name] Other Clinician: Referring Laylia Mui: Treating Michaiah Holsopple/Extender:Stone  III, Rhea Bleacher, Marylyn Ishihara in Treatment: 4 Wound Status Wound Number: 1 Primary Atypical Etiology: Wound Location: Penis Wound Open Wounding Event: Gradually Appeared Status: Date Acquired: 01/21/2019 Comorbid Anemia, Congestive Heart Failure, Weeks Of Treatment: 4 History: Hypertension, Type II Diabetes, End Stage Clustered Wound: Yes Renal Disease, Raynauds, Neuropathy Wound Measurements Length: (cm) 2 % Reduct Width: (cm) 3.2 % Reduct Depth: (cm) 0.1 Epitheli Clustered Quantity: 2 Tunnelin Area: (cm) 5.027 Undermi Volume: (cm) 0.503 Wound Description Classification: Full Thickness Without Exposed Support Foul Od Structures Slough/ Wound Flat and Intact Margin: Exudate Medium Amount: Exudate Serosanguineous Type: Exudate Exudate red, brown Color: Wound Bed Granulation Amount: Large (67-100%) Granulation Quality: Pink, Pale Fascia Ex Necrotic Amount: Small (1-33%) Fat Layer Necrotic Quality: Adherent Slough Tendon Ex Muscle Ex Joint Exp Bone Expo or After Cleansing: No Fibrino Yes Exposed Structure posed: No (Subcutaneous Tissue) Exposed: Yes posed: No posed: No osed: No sed: No ion in Area: -2.4% ion in Volume: 48.8% alization: Small (1-33%) g: No ning: No Treatment Notes Wound #1 (Penis) 1. Cleanse With Wound Cleanser Soap and water 3. Primary Dressing Applied Santyl 4. Secondary Dressing Dry Gauze Notes normal saline moist gauze for wet to dry Electronic Signature(s) Signed: 06/13/2019 5:22:14 PM By: Deon Pilling Entered By: Deon Pilling on 06/13/2019 10:46:46 -------------------------------------------------------------------------------- Vitals Details Patient Name: Date of Service: [name] 06/13/2019 10:15 AM Medical Record [id number] Patient Account Number: [id number] Date of Birth/Sex: Treating RN: 11-23-1965 (54 y.o.  Hessie Diener Primary Care Jj Enyeart: [name] Other Clinician: Referring  Todd Jelinski: Treating Abrar Bilton/Extender:Stone III, Rhea Bleacher, Marylyn Ishihara in Treatment: 4 Vital Signs Time Taken: 10:40 Temperature (F): 98.4 Height (in): 65 Pulse (bpm): 103 Weight (lbs): 240 Respiratory Rate (breaths/min): 20 Body Mass Index (BMI): 39.9 Blood Pressure (mmHg): 98/44 Capillary Blood Glucose (mg/dl): 201 Reference Range: 80 - 120 mg / dl Electronic Signature(s) Signed: 06/13/2019 5:22:14 PM By: Deon Pilling Entered By: Deon Pilling on 06/13/2019 10:42:51

## 2019-06-14 DIAGNOSIS — D509 Iron deficiency anemia, unspecified: Secondary | ICD-10-CM | POA: Diagnosis not present

## 2019-06-14 DIAGNOSIS — N186 End stage renal disease: Secondary | ICD-10-CM | POA: Diagnosis not present

## 2019-06-14 DIAGNOSIS — D631 Anemia in chronic kidney disease: Secondary | ICD-10-CM | POA: Diagnosis not present

## 2019-06-14 DIAGNOSIS — N2581 Secondary hyperparathyroidism of renal origin: Secondary | ICD-10-CM | POA: Diagnosis not present

## 2019-06-16 DIAGNOSIS — N186 End stage renal disease: Secondary | ICD-10-CM | POA: Diagnosis not present

## 2019-06-16 DIAGNOSIS — D631 Anemia in chronic kidney disease: Secondary | ICD-10-CM | POA: Diagnosis not present

## 2019-06-16 DIAGNOSIS — D509 Iron deficiency anemia, unspecified: Secondary | ICD-10-CM | POA: Diagnosis not present

## 2019-06-16 DIAGNOSIS — N2581 Secondary hyperparathyroidism of renal origin: Secondary | ICD-10-CM | POA: Diagnosis not present

## 2019-06-16 LAB — AEROBIC CULTURE W GRAM STAIN (SUPERFICIAL SPECIMEN)

## 2019-06-19 DIAGNOSIS — N186 End stage renal disease: Secondary | ICD-10-CM | POA: Diagnosis not present

## 2019-06-19 DIAGNOSIS — N2581 Secondary hyperparathyroidism of renal origin: Secondary | ICD-10-CM | POA: Diagnosis not present

## 2019-06-19 DIAGNOSIS — D509 Iron deficiency anemia, unspecified: Secondary | ICD-10-CM | POA: Diagnosis not present

## 2019-06-19 DIAGNOSIS — D631 Anemia in chronic kidney disease: Secondary | ICD-10-CM | POA: Diagnosis not present

## 2019-06-20 DIAGNOSIS — N186 End stage renal disease: Secondary | ICD-10-CM | POA: Diagnosis not present

## 2019-06-20 DIAGNOSIS — Z992 Dependence on renal dialysis: Secondary | ICD-10-CM | POA: Diagnosis not present

## 2019-06-21 DIAGNOSIS — D509 Iron deficiency anemia, unspecified: Secondary | ICD-10-CM | POA: Diagnosis not present

## 2019-06-21 DIAGNOSIS — N186 End stage renal disease: Secondary | ICD-10-CM | POA: Diagnosis not present

## 2019-06-21 DIAGNOSIS — N2581 Secondary hyperparathyroidism of renal origin: Secondary | ICD-10-CM | POA: Diagnosis not present

## 2019-06-21 DIAGNOSIS — D631 Anemia in chronic kidney disease: Secondary | ICD-10-CM | POA: Diagnosis not present

## 2019-06-21 DIAGNOSIS — E785 Hyperlipidemia, unspecified: Secondary | ICD-10-CM | POA: Diagnosis not present

## 2019-06-23 DIAGNOSIS — E785 Hyperlipidemia, unspecified: Secondary | ICD-10-CM | POA: Diagnosis not present

## 2019-06-23 DIAGNOSIS — D631 Anemia in chronic kidney disease: Secondary | ICD-10-CM | POA: Diagnosis not present

## 2019-06-23 DIAGNOSIS — N2581 Secondary hyperparathyroidism of renal origin: Secondary | ICD-10-CM | POA: Diagnosis not present

## 2019-06-23 DIAGNOSIS — N186 End stage renal disease: Secondary | ICD-10-CM | POA: Diagnosis not present

## 2019-06-23 DIAGNOSIS — D509 Iron deficiency anemia, unspecified: Secondary | ICD-10-CM | POA: Diagnosis not present

## 2019-06-25 DIAGNOSIS — L988 Other specified disorders of the skin and subcutaneous tissue: Secondary | ICD-10-CM | POA: Diagnosis not present

## 2019-06-26 DIAGNOSIS — E785 Hyperlipidemia, unspecified: Secondary | ICD-10-CM | POA: Diagnosis not present

## 2019-06-26 DIAGNOSIS — N186 End stage renal disease: Secondary | ICD-10-CM | POA: Diagnosis not present

## 2019-06-26 DIAGNOSIS — N2581 Secondary hyperparathyroidism of renal origin: Secondary | ICD-10-CM | POA: Diagnosis not present

## 2019-06-26 DIAGNOSIS — D509 Iron deficiency anemia, unspecified: Secondary | ICD-10-CM | POA: Diagnosis not present

## 2019-06-26 DIAGNOSIS — D631 Anemia in chronic kidney disease: Secondary | ICD-10-CM | POA: Diagnosis not present

## 2019-06-27 ENCOUNTER — Encounter (HOSPITAL_BASED_OUTPATIENT_CLINIC_OR_DEPARTMENT_OTHER): Payer: 59 | Attending: Physician Assistant | Admitting: Physician Assistant

## 2019-06-28 DIAGNOSIS — D509 Iron deficiency anemia, unspecified: Secondary | ICD-10-CM | POA: Diagnosis not present

## 2019-06-28 DIAGNOSIS — D631 Anemia in chronic kidney disease: Secondary | ICD-10-CM | POA: Diagnosis not present

## 2019-06-28 DIAGNOSIS — E785 Hyperlipidemia, unspecified: Secondary | ICD-10-CM | POA: Diagnosis not present

## 2019-06-28 DIAGNOSIS — N186 End stage renal disease: Secondary | ICD-10-CM | POA: Diagnosis not present

## 2019-06-28 DIAGNOSIS — N2581 Secondary hyperparathyroidism of renal origin: Secondary | ICD-10-CM | POA: Diagnosis not present

## 2019-06-28 DIAGNOSIS — E119 Type 2 diabetes mellitus without complications: Secondary | ICD-10-CM | POA: Diagnosis not present

## 2019-06-29 DIAGNOSIS — M792 Neuralgia and neuritis, unspecified: Secondary | ICD-10-CM | POA: Diagnosis not present

## 2019-06-29 DIAGNOSIS — M2550 Pain in unspecified joint: Secondary | ICD-10-CM | POA: Diagnosis not present

## 2019-06-29 DIAGNOSIS — G894 Chronic pain syndrome: Secondary | ICD-10-CM | POA: Diagnosis not present

## 2019-06-29 DIAGNOSIS — S68119A Complete traumatic metacarpophalangeal amputation of unspecified finger, initial encounter: Secondary | ICD-10-CM | POA: Diagnosis not present

## 2019-06-30 DIAGNOSIS — N2581 Secondary hyperparathyroidism of renal origin: Secondary | ICD-10-CM | POA: Diagnosis not present

## 2019-06-30 DIAGNOSIS — N186 End stage renal disease: Secondary | ICD-10-CM | POA: Diagnosis not present

## 2019-06-30 DIAGNOSIS — D509 Iron deficiency anemia, unspecified: Secondary | ICD-10-CM | POA: Diagnosis not present

## 2019-06-30 DIAGNOSIS — E785 Hyperlipidemia, unspecified: Secondary | ICD-10-CM | POA: Diagnosis not present

## 2019-06-30 DIAGNOSIS — D631 Anemia in chronic kidney disease: Secondary | ICD-10-CM | POA: Diagnosis not present

## 2019-07-02 ENCOUNTER — Other Ambulatory Visit: Payer: Self-pay | Admitting: Surgery

## 2019-07-02 DIAGNOSIS — R590 Localized enlarged lymph nodes: Secondary | ICD-10-CM | POA: Diagnosis not present

## 2019-07-03 DIAGNOSIS — D631 Anemia in chronic kidney disease: Secondary | ICD-10-CM | POA: Diagnosis not present

## 2019-07-03 DIAGNOSIS — N2581 Secondary hyperparathyroidism of renal origin: Secondary | ICD-10-CM | POA: Diagnosis not present

## 2019-07-03 DIAGNOSIS — E785 Hyperlipidemia, unspecified: Secondary | ICD-10-CM | POA: Diagnosis not present

## 2019-07-03 DIAGNOSIS — N186 End stage renal disease: Secondary | ICD-10-CM | POA: Diagnosis not present

## 2019-07-03 DIAGNOSIS — D509 Iron deficiency anemia, unspecified: Secondary | ICD-10-CM | POA: Diagnosis not present

## 2019-07-05 DIAGNOSIS — N2581 Secondary hyperparathyroidism of renal origin: Secondary | ICD-10-CM | POA: Diagnosis not present

## 2019-07-05 DIAGNOSIS — E785 Hyperlipidemia, unspecified: Secondary | ICD-10-CM | POA: Diagnosis not present

## 2019-07-05 DIAGNOSIS — D509 Iron deficiency anemia, unspecified: Secondary | ICD-10-CM | POA: Diagnosis not present

## 2019-07-05 DIAGNOSIS — D631 Anemia in chronic kidney disease: Secondary | ICD-10-CM | POA: Diagnosis not present

## 2019-07-05 DIAGNOSIS — Z125 Encounter for screening for malignant neoplasm of prostate: Secondary | ICD-10-CM | POA: Diagnosis not present

## 2019-07-05 DIAGNOSIS — N186 End stage renal disease: Secondary | ICD-10-CM | POA: Diagnosis not present

## 2019-07-07 DIAGNOSIS — E785 Hyperlipidemia, unspecified: Secondary | ICD-10-CM | POA: Diagnosis not present

## 2019-07-07 DIAGNOSIS — N186 End stage renal disease: Secondary | ICD-10-CM | POA: Diagnosis not present

## 2019-07-07 DIAGNOSIS — D631 Anemia in chronic kidney disease: Secondary | ICD-10-CM | POA: Diagnosis not present

## 2019-07-07 DIAGNOSIS — N2581 Secondary hyperparathyroidism of renal origin: Secondary | ICD-10-CM | POA: Diagnosis not present

## 2019-07-07 DIAGNOSIS — D509 Iron deficiency anemia, unspecified: Secondary | ICD-10-CM | POA: Diagnosis not present

## 2019-07-10 DIAGNOSIS — D509 Iron deficiency anemia, unspecified: Secondary | ICD-10-CM | POA: Diagnosis not present

## 2019-07-10 DIAGNOSIS — N186 End stage renal disease: Secondary | ICD-10-CM | POA: Diagnosis not present

## 2019-07-10 DIAGNOSIS — E785 Hyperlipidemia, unspecified: Secondary | ICD-10-CM | POA: Diagnosis not present

## 2019-07-10 DIAGNOSIS — D631 Anemia in chronic kidney disease: Secondary | ICD-10-CM | POA: Diagnosis not present

## 2019-07-10 DIAGNOSIS — N2581 Secondary hyperparathyroidism of renal origin: Secondary | ICD-10-CM | POA: Diagnosis not present

## 2019-07-12 DIAGNOSIS — E785 Hyperlipidemia, unspecified: Secondary | ICD-10-CM | POA: Diagnosis not present

## 2019-07-12 DIAGNOSIS — N2581 Secondary hyperparathyroidism of renal origin: Secondary | ICD-10-CM | POA: Diagnosis not present

## 2019-07-12 DIAGNOSIS — D509 Iron deficiency anemia, unspecified: Secondary | ICD-10-CM | POA: Diagnosis not present

## 2019-07-12 DIAGNOSIS — N186 End stage renal disease: Secondary | ICD-10-CM | POA: Diagnosis not present

## 2019-07-12 DIAGNOSIS — D631 Anemia in chronic kidney disease: Secondary | ICD-10-CM | POA: Diagnosis not present

## 2019-07-14 DIAGNOSIS — E785 Hyperlipidemia, unspecified: Secondary | ICD-10-CM | POA: Diagnosis not present

## 2019-07-14 DIAGNOSIS — D509 Iron deficiency anemia, unspecified: Secondary | ICD-10-CM | POA: Diagnosis not present

## 2019-07-14 DIAGNOSIS — N2581 Secondary hyperparathyroidism of renal origin: Secondary | ICD-10-CM | POA: Diagnosis not present

## 2019-07-14 DIAGNOSIS — N186 End stage renal disease: Secondary | ICD-10-CM | POA: Diagnosis not present

## 2019-07-14 DIAGNOSIS — D631 Anemia in chronic kidney disease: Secondary | ICD-10-CM | POA: Diagnosis not present

## 2019-07-16 ENCOUNTER — Ambulatory Visit (INDEPENDENT_AMBULATORY_CARE_PROVIDER_SITE_OTHER): Payer: Medicare Other | Admitting: Internal Medicine

## 2019-07-16 ENCOUNTER — Other Ambulatory Visit: Payer: Self-pay

## 2019-07-16 ENCOUNTER — Encounter: Payer: Self-pay | Admitting: Internal Medicine

## 2019-07-16 DIAGNOSIS — N485 Ulcer of penis: Secondary | ICD-10-CM | POA: Diagnosis not present

## 2019-07-16 DIAGNOSIS — N4889 Other specified disorders of penis: Secondary | ICD-10-CM | POA: Insufficient documentation

## 2019-07-16 MED ORDER — AMOXICILLIN-POT CLAVULANATE 875-125 MG PO TABS: 1.0000 | ORAL_TABLET | Freq: Two times a day (BID) | ORAL | 1 refills | Status: DC

## 2019-07-16 MED ORDER — COLLAGENASE 250 UNIT/GM EX OINT: 1.0000 "application " | TOPICAL_OINTMENT | Freq: Every day | CUTANEOUS | 0 refills | Status: DC

## 2019-07-16 NOTE — Progress Notes (Signed)
[name]      Reason for Consult: penile ulcer    Referring Physician: PA Hoyt    Patient ID: [name] [name], male    DOB: 19-Apr-1965, 54 y.o.   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   Started about 3 months ago with an ulcer and now with white patches, inflammation.  Had a culture of it with Enterococcus.  No known history of HSV.  Not sexually active for years.  No urethral discharge.  No fever.  Never had it before.  Syphilis was negative by report. Asking for a refill of Sayntyl.  Has not been on antiviral or antibacterials.  Was told he was ruled out for calciphylaxis.    Past Medical History:  Diagnosis Date  . Anal infection    posterior anal canal  . Anemia, chronic renal failure   . Atrophic kidney    BILATERAL  . DM type 2 causing ESRD [facility])    Nephrologist-- dr [name] [name])--  on hemodialysis since June 2012 at  [name]  TTS  . Hemodialysis patient [facility])    at [place] on Tues/ Thur/Sat/schedule  . Hemorrhoids   . Hepatitis B antibody positive   . History of pleural effusion    bilateral  . Hyperparathyroidism, secondary renal ([place])   . Hypertension   . Ischemic cardiomyopathy    per echo 07-01-2014  ef 45%  . LAFB (left anterior fascicular block)   . Peripheral neuropathy   . Systolic and diastolic CHF, chronic ([place])    CARDIOLOGIST-  DR Daneen Schick ([place])  AND DR [name] ([name])    Prior to Admission medications   Medication Sig Start Date End Date Taking? Authorizing Provider  acetaminophen (TYLENOL) 500 MG tablet Take 1,000 mg by mouth as needed.   Yes [provider]  allopurinol (ZYLOPRIM) 100 MG tablet Take 100 mg by mouth daily. 05/28/19  Yes [provider]  aspirin 325 MG tablet Take 325 mg by mouth daily.   Yes [provider]  calcium acetate (PHOSLO) 667 MG capsule Take 2,001 mg by mouth 3  (three) times daily with meals.  06/02/10  Yes [provider]  celecoxib (CELEBREX) 100 MG capsule Take 100 mg by mouth daily. 04/25/19  Yes [provider]  cyanocobalamin 1000 MCG tablet Take by mouth. 06/02/10  Yes [provider]  cycloSPORINE (RESTASIS) 0.05 % ophthalmic emulsion Place 1 drop into both eyes 2 (two) times daily. 04/22/14  Yes [provider]  febuxostat (ULORIC) 40 MG tablet Take 40 mg by mouth daily.    Yes [provider]  ferric citrate (AURYXIA) 1 GM 210 MG(Fe) tablet Take 630 mg by mouth 3 (three) times daily.    Yes [provider]  gabapentin (NEURONTIN) 300 MG capsule Take 600 mg by mouth as needed. For pain 05/09/15  Yes [provider]  Multiple Vitamins-Minerals (MEGA MULTIVITAMIN FOR MEN PO) Take 1 tablet by mouth daily.    Yes [provider]  NON FORMULARY humalog omnipod three times daily adjusted to food intake   Yes [provider]  oxyCODONE-acetaminophen (PERCOCET) 10-325 MG tablet Take 1 tablet by mouth every 4 (four) hours as needed for pain.  07/04/18  Yes [provider]  midodrine (PROAMATINE) 5 MG tablet Take 5 mg by mouth 3 (three) times a week. Every Tuesday Thursday and Saturday  [provider]    No Known Allergies  Social History   Tobacco Use  . Smoking status: Never Smoker  . Smokeless tobacco: Never Used  Substance Use Topics  . Alcohol use: No  . Drug use: No    Family History  Family history unknown: Yes    Review of Systems  Constitutional: negative for fevers and chills Gastrointestinal: negative for nausea and diarrhea All other systems reviewed and are negative    Constitutional: in no apparent distress  Vitals:   07/16/19 1009  BP: 139/80  Pulse: (!) 101  Temp: 98.3 F (36.8 C)   EYES: anicteric GI: obese Skin: negatives: no rash GU: glans penis with white pus-like patches, inflammation  Labs: Lab Results    Component Value Date   WBC 7.6 04/12/2019   HGB 10.8 (L) 04/12/2019   HCT 38.4 (L) 04/12/2019   MCV 90.6 04/12/2019   PLT 329 04/12/2019    Lab Results  Component Value Date   CREATININE 9.21 (H) 04/12/2019   BUN 26 (H) 04/12/2019   NA 139 04/12/2019   K 6.3 (HH) 04/12/2019   CL 96 (L) 04/12/2019   CO2 23 04/12/2019    Lab Results  Component Value Date   [name] 14 10/13/2017   AST 17 10/13/2017   ALKPHOS 70 10/13/2017   BILITOT 0.6 10/13/2017   INR 1.0 04/12/2019     Assessment: possible balanitis.  He may have developed an underlying ulcer with a superinfection and will try Augmentin based on recent culture.   Will recheck in 1 week.   May need a biopsy if not improved.    Plan: 1) Augmentin rtc 1 week

## 2019-07-17 DIAGNOSIS — D631 Anemia in chronic kidney disease: Secondary | ICD-10-CM | POA: Diagnosis not present

## 2019-07-17 DIAGNOSIS — E785 Hyperlipidemia, unspecified: Secondary | ICD-10-CM | POA: Diagnosis not present

## 2019-07-17 DIAGNOSIS — N2581 Secondary hyperparathyroidism of renal origin: Secondary | ICD-10-CM | POA: Diagnosis not present

## 2019-07-17 DIAGNOSIS — D509 Iron deficiency anemia, unspecified: Secondary | ICD-10-CM | POA: Diagnosis not present

## 2019-07-17 DIAGNOSIS — N186 End stage renal disease: Secondary | ICD-10-CM | POA: Diagnosis not present

## 2019-07-18 ENCOUNTER — Encounter (HOSPITAL_BASED_OUTPATIENT_CLINIC_OR_DEPARTMENT_OTHER): Payer: 59 | Attending: Physician Assistant | Admitting: Physician Assistant

## 2019-07-18 DIAGNOSIS — G609 Hereditary and idiopathic neuropathy, unspecified: Secondary | ICD-10-CM | POA: Diagnosis not present

## 2019-07-18 DIAGNOSIS — M792 Neuralgia and neuritis, unspecified: Secondary | ICD-10-CM | POA: Diagnosis not present

## 2019-07-18 DIAGNOSIS — F4542 Pain disorder with related psychological factors: Secondary | ICD-10-CM | POA: Diagnosis not present

## 2019-07-18 DIAGNOSIS — G894 Chronic pain syndrome: Secondary | ICD-10-CM | POA: Diagnosis not present

## 2019-07-19 DIAGNOSIS — E785 Hyperlipidemia, unspecified: Secondary | ICD-10-CM | POA: Diagnosis not present

## 2019-07-19 DIAGNOSIS — D509 Iron deficiency anemia, unspecified: Secondary | ICD-10-CM | POA: Diagnosis not present

## 2019-07-19 DIAGNOSIS — N2581 Secondary hyperparathyroidism of renal origin: Secondary | ICD-10-CM | POA: Diagnosis not present

## 2019-07-19 DIAGNOSIS — D631 Anemia in chronic kidney disease: Secondary | ICD-10-CM | POA: Diagnosis not present

## 2019-07-19 DIAGNOSIS — N186 End stage renal disease: Secondary | ICD-10-CM | POA: Diagnosis not present

## 2019-07-20 DIAGNOSIS — Z992 Dependence on renal dialysis: Secondary | ICD-10-CM | POA: Diagnosis not present

## 2019-07-20 DIAGNOSIS — N186 End stage renal disease: Secondary | ICD-10-CM | POA: Diagnosis not present

## 2019-07-21 DIAGNOSIS — D631 Anemia in chronic kidney disease: Secondary | ICD-10-CM | POA: Diagnosis not present

## 2019-07-21 DIAGNOSIS — N2581 Secondary hyperparathyroidism of renal origin: Secondary | ICD-10-CM | POA: Diagnosis not present

## 2019-07-21 DIAGNOSIS — N186 End stage renal disease: Secondary | ICD-10-CM | POA: Diagnosis not present

## 2019-07-23 DIAGNOSIS — M159 Polyosteoarthritis, unspecified: Secondary | ICD-10-CM | POA: Diagnosis not present

## 2019-07-23 DIAGNOSIS — Z79899 Other long term (current) drug therapy: Secondary | ICD-10-CM | POA: Diagnosis not present

## 2019-07-23 DIAGNOSIS — I8393 Asymptomatic varicose veins of bilateral lower extremities: Secondary | ICD-10-CM | POA: Diagnosis not present

## 2019-07-23 DIAGNOSIS — E1142 Type 2 diabetes mellitus with diabetic polyneuropathy: Secondary | ICD-10-CM | POA: Diagnosis not present

## 2019-07-23 DIAGNOSIS — E875 Hyperkalemia: Secondary | ICD-10-CM | POA: Diagnosis not present

## 2019-07-23 DIAGNOSIS — Z79891 Long term (current) use of opiate analgesic: Secondary | ICD-10-CM | POA: Diagnosis not present

## 2019-07-23 DIAGNOSIS — I5030 Unspecified diastolic (congestive) heart failure: Secondary | ICD-10-CM | POA: Diagnosis not present

## 2019-07-23 DIAGNOSIS — M79641 Pain in right hand: Secondary | ICD-10-CM | POA: Diagnosis not present

## 2019-07-23 DIAGNOSIS — M79604 Pain in right leg: Secondary | ICD-10-CM | POA: Diagnosis not present

## 2019-07-23 DIAGNOSIS — N186 End stage renal disease: Secondary | ICD-10-CM | POA: Diagnosis not present

## 2019-07-23 DIAGNOSIS — E7800 Pure hypercholesterolemia, unspecified: Secondary | ICD-10-CM | POA: Diagnosis not present

## 2019-07-23 DIAGNOSIS — I739 Peripheral vascular disease, unspecified: Secondary | ICD-10-CM | POA: Diagnosis not present

## 2019-07-24 DIAGNOSIS — N2581 Secondary hyperparathyroidism of renal origin: Secondary | ICD-10-CM | POA: Diagnosis not present

## 2019-07-24 DIAGNOSIS — D631 Anemia in chronic kidney disease: Secondary | ICD-10-CM | POA: Diagnosis not present

## 2019-07-24 DIAGNOSIS — N186 End stage renal disease: Secondary | ICD-10-CM | POA: Diagnosis not present

## 2019-07-25 ENCOUNTER — Other Ambulatory Visit: Payer: Self-pay

## 2019-07-25 ENCOUNTER — Encounter: Payer: Self-pay | Admitting: Internal Medicine

## 2019-07-25 ENCOUNTER — Ambulatory Visit (INDEPENDENT_AMBULATORY_CARE_PROVIDER_SITE_OTHER): Payer: Medicare Other | Admitting: Internal Medicine

## 2019-07-25 VITALS — Wt 243.0 lb

## 2019-07-25 DIAGNOSIS — Z5181 Encounter for therapeutic drug level monitoring: Secondary | ICD-10-CM

## 2019-07-25 DIAGNOSIS — N485 Ulcer of penis: Secondary | ICD-10-CM | POA: Diagnosis not present

## 2019-07-25 MED ORDER — AMOXICILLIN-POT CLAVULANATE 875-125 MG PO TABS: 1.0000 | ORAL_TABLET | Freq: Two times a day (BID) | ORAL | 1 refills | Status: DC

## 2019-07-25 NOTE — Assessment & Plan Note (Signed)
Tolerating the  Augmentin and will refill another week Follow up in 2 weeks

## 2019-07-25 NOTE — Assessment & Plan Note (Signed)
I am unsure of what is causing these areas of plaques and associated pain and erythema of the glans.  He has responded some to appropriate antibiotics but this is not resolving it.  I would recommend a biopsy of the lesion by urology and he is going to arrange follow up.

## 2019-07-25 NOTE — Progress Notes (Signed)
   Subjective:    Patient ID: [name] [name], male    DOB: [birth year], 54 y.o.   MRN: [id number]  HPI Here for follow up of penile ulcers and inflammation. He has had several areas of white firm lesions on his foreskin and some external drainage and significant pain.  He had a superficial culture done with Enterococcus and I started him on Augmentin.  He has had some improvement and continues with the wound care.  He has had a skin shave by his urologist but no biopsy.     Review of Systems  Constitutional: Negative for chills and fever.       Objective:   Physical Exam Exam conducted with a chaperone present.  Eyes:     General: No scleral icterus. Genitourinary:    Comments: Several white plaques on the upper foreskin Neurological:     Mental Status: He is alert.           Assessment & Plan:

## 2019-07-26 DIAGNOSIS — N186 End stage renal disease: Secondary | ICD-10-CM | POA: Diagnosis not present

## 2019-07-26 DIAGNOSIS — N2581 Secondary hyperparathyroidism of renal origin: Secondary | ICD-10-CM | POA: Diagnosis not present

## 2019-07-26 DIAGNOSIS — E119 Type 2 diabetes mellitus without complications: Secondary | ICD-10-CM | POA: Diagnosis not present

## 2019-07-26 DIAGNOSIS — D631 Anemia in chronic kidney disease: Secondary | ICD-10-CM | POA: Diagnosis not present

## 2019-07-27 DIAGNOSIS — G894 Chronic pain syndrome: Secondary | ICD-10-CM | POA: Diagnosis not present

## 2019-07-27 DIAGNOSIS — S68119A Complete traumatic metacarpophalangeal amputation of unspecified finger, initial encounter: Secondary | ICD-10-CM | POA: Diagnosis not present

## 2019-07-27 DIAGNOSIS — M2550 Pain in unspecified joint: Secondary | ICD-10-CM | POA: Diagnosis not present

## 2019-07-27 DIAGNOSIS — M792 Neuralgia and neuritis, unspecified: Secondary | ICD-10-CM | POA: Diagnosis not present

## 2019-07-28 DIAGNOSIS — N2581 Secondary hyperparathyroidism of renal origin: Secondary | ICD-10-CM | POA: Diagnosis not present

## 2019-07-28 DIAGNOSIS — N186 End stage renal disease: Secondary | ICD-10-CM | POA: Diagnosis not present

## 2019-07-28 DIAGNOSIS — D631 Anemia in chronic kidney disease: Secondary | ICD-10-CM | POA: Diagnosis not present

## 2019-07-31 DIAGNOSIS — D631 Anemia in chronic kidney disease: Secondary | ICD-10-CM | POA: Diagnosis not present

## 2019-07-31 DIAGNOSIS — N186 End stage renal disease: Secondary | ICD-10-CM | POA: Diagnosis not present

## 2019-07-31 DIAGNOSIS — N2581 Secondary hyperparathyroidism of renal origin: Secondary | ICD-10-CM | POA: Diagnosis not present

## 2019-08-01 ENCOUNTER — Encounter (HOSPITAL_BASED_OUTPATIENT_CLINIC_OR_DEPARTMENT_OTHER): Payer: Medicare Other | Admitting: Physician Assistant

## 2019-08-02 DIAGNOSIS — D631 Anemia in chronic kidney disease: Secondary | ICD-10-CM | POA: Diagnosis not present

## 2019-08-02 DIAGNOSIS — N186 End stage renal disease: Secondary | ICD-10-CM | POA: Diagnosis not present

## 2019-08-02 DIAGNOSIS — N2581 Secondary hyperparathyroidism of renal origin: Secondary | ICD-10-CM | POA: Diagnosis not present

## 2019-08-04 DIAGNOSIS — D631 Anemia in chronic kidney disease: Secondary | ICD-10-CM | POA: Diagnosis not present

## 2019-08-04 DIAGNOSIS — N2581 Secondary hyperparathyroidism of renal origin: Secondary | ICD-10-CM | POA: Diagnosis not present

## 2019-08-04 DIAGNOSIS — N186 End stage renal disease: Secondary | ICD-10-CM | POA: Diagnosis not present

## 2019-08-06 ENCOUNTER — Other Ambulatory Visit: Payer: Self-pay

## 2019-08-06 ENCOUNTER — Ambulatory Visit (INDEPENDENT_AMBULATORY_CARE_PROVIDER_SITE_OTHER): Payer: Medicare Other | Admitting: Podiatry

## 2019-08-06 DIAGNOSIS — B351 Tinea unguium: Secondary | ICD-10-CM | POA: Diagnosis not present

## 2019-08-06 DIAGNOSIS — M79675 Pain in left toe(s): Secondary | ICD-10-CM

## 2019-08-06 DIAGNOSIS — M79674 Pain in right toe(s): Secondary | ICD-10-CM

## 2019-08-06 DIAGNOSIS — L988 Other specified disorders of the skin and subcutaneous tissue: Secondary | ICD-10-CM | POA: Diagnosis not present

## 2019-08-06 DIAGNOSIS — E1165 Type 2 diabetes mellitus with hyperglycemia: Secondary | ICD-10-CM

## 2019-08-07 DIAGNOSIS — N2581 Secondary hyperparathyroidism of renal origin: Secondary | ICD-10-CM | POA: Diagnosis not present

## 2019-08-07 DIAGNOSIS — N186 End stage renal disease: Secondary | ICD-10-CM | POA: Diagnosis not present

## 2019-08-07 DIAGNOSIS — D631 Anemia in chronic kidney disease: Secondary | ICD-10-CM | POA: Diagnosis not present

## 2019-08-08 ENCOUNTER — Ambulatory Visit (INDEPENDENT_AMBULATORY_CARE_PROVIDER_SITE_OTHER): Payer: Medicare Other | Admitting: Internal Medicine

## 2019-08-08 ENCOUNTER — Encounter: Payer: Self-pay | Admitting: Internal Medicine

## 2019-08-08 ENCOUNTER — Ambulatory Visit (HOSPITAL_BASED_OUTPATIENT_CLINIC_OR_DEPARTMENT_OTHER): Payer: Medicare Other | Admitting: Physician Assistant

## 2019-08-08 ENCOUNTER — Other Ambulatory Visit: Payer: Self-pay

## 2019-08-08 VITALS — BP 130/76 | HR 101 | Wt 233.0 lb

## 2019-08-08 DIAGNOSIS — I10 Essential (primary) hypertension: Secondary | ICD-10-CM | POA: Diagnosis not present

## 2019-08-08 DIAGNOSIS — N4889 Other specified disorders of penis: Secondary | ICD-10-CM

## 2019-08-08 NOTE — Assessment & Plan Note (Signed)
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  I recommend a biopsy of the area and have advised him to go back to urology for this.  He wanted to discuss it first with his wound care team and has an appointment with Kelli Churn PA coming up.

## 2019-08-08 NOTE — Progress Notes (Signed)
   Subjective:    Patient ID: [name] [name], male    DOB: [birth year], 54 y.o.   MRN: [id number]  HPI Here for follow up of penile ulcers and inflammation. He has had several areas of white firm lesions on his foreskin and some external drainage and significant pain.  He had a superficial culture done with Enterococcus and I started him on Augmentin.  He had some improvement but still with significant symptoms so had him continue with Augmentin, now for 4 weeks and he was going to go back to urology.  He though only has an appointment with wound care coming up.  His pain is improved and he feels his inflammation is better but the growth is still there.   Review of Systems  Constitutional: Negative for chills and fever.       Objective:   Physical Exam Exam conducted with a chaperone present.  Eyes:     General: No scleral icterus. Genitourinary:    Comments: Several white plaques on the upper foreskin and cauliflower-like area on the glans Neurological:     Mental Status: He is alert.           Assessment & Plan:

## 2019-08-08 NOTE — Assessment & Plan Note (Signed)
BP wnl.   

## 2019-08-09 DIAGNOSIS — N186 End stage renal disease: Secondary | ICD-10-CM | POA: Diagnosis not present

## 2019-08-09 DIAGNOSIS — D631 Anemia in chronic kidney disease: Secondary | ICD-10-CM | POA: Diagnosis not present

## 2019-08-09 DIAGNOSIS — N2581 Secondary hyperparathyroidism of renal origin: Secondary | ICD-10-CM | POA: Diagnosis not present

## 2019-08-10 ENCOUNTER — Encounter (HOSPITAL_BASED_OUTPATIENT_CLINIC_OR_DEPARTMENT_OTHER): Payer: Medicare Other | Attending: Physician Assistant | Admitting: Internal Medicine

## 2019-08-10 DIAGNOSIS — N485 Ulcer of penis: Secondary | ICD-10-CM | POA: Diagnosis not present

## 2019-08-10 DIAGNOSIS — N186 End stage renal disease: Secondary | ICD-10-CM | POA: Insufficient documentation

## 2019-08-10 DIAGNOSIS — Z992 Dependence on renal dialysis: Secondary | ICD-10-CM | POA: Insufficient documentation

## 2019-08-10 DIAGNOSIS — E11622 Type 2 diabetes mellitus with other skin ulcer: Secondary | ICD-10-CM | POA: Insufficient documentation

## 2019-08-10 DIAGNOSIS — E1122 Type 2 diabetes mellitus with diabetic chronic kidney disease: Secondary | ICD-10-CM | POA: Diagnosis not present

## 2019-08-10 DIAGNOSIS — E1169 Type 2 diabetes mellitus with other specified complication: Secondary | ICD-10-CM | POA: Diagnosis not present

## 2019-08-10 NOTE — Progress Notes (Signed)
ARHAN, MCMANAMON ([id number]) Visit Report for 08/10/2019 HPI Details Patient Name: Date of Service: [place], [name] 08/10/2019 2:30 PM Medical Record Number: [id number] Patient Account Number: [id number] Date of Birth/Sex: Treating RN: September 15, 1965 (54 y.o. Marvis Repress Primary Care Provider: [name] Other Clinician: Referring Provider: Treating Provider/Extender: Tessie Eke in Treatment: 12 History of Present Illness HPI Description: 05/16/2019 patient presents today for initial evaluation here in our clinic concerning an issue he has been having with a wound on the dorsal surface of the distal portion of his penis. This has been present he tells me for about 2 months I do have a note from the urologist which actually states 4 months. 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 With that being said the patient has also been seen by his primary care provider he tells [place] that he has been screened for sexually transmitted diseases including syphilis although we did contact his primary care provider as well who states that they did not have any testing on him as such. Urology also did not perform any testing. The patient tells me he has not been sexually active since 2014 that he has not had any ability to do so. He is on dialysis and unfortunately is having a lot of issues as far as sexual dysfunction is concerned. 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 He tells me that the ulcer came out first as a bump on the end of his penis which was tender to touch. He continue to watch this and unfortunately as things progressed he states that it eventually opened, ulcerated, and has been extremely painful. He really does not urinate secondary to  being on dialysis and his end-stage renal disease. He does have diabetes mellitus type 2. 05/23/2019 upon evaluation today patient's wound on his penis actually appears to be doing better compared to last week's evaluation. Unfortunately he still is having significant pain but there is really not necrotic tissue noted as badly as it was at the last visit. Overall I am pleased in this regard. We still have not been able to get the result back from the RPR testing that he had done at [facility]. We have called them throughout the day today and we have not been able to get to talk with anyone yet. Obviously as soon as we get the results of this we will have to likely give them a call not to make him stay here all day. 05/30/2019 upon evaluation today fortunately we finally got the patient's RPR test back and it was negative. With that being said he is continuing to have some issues with pain. No fevers, chills, nausea, vomiting, or diarrhea. Overall I do feel like the wound is making good progress however 06/13/2019 upon evaluation today patient unfortunately appears to be doing more poorly in regard to the wound at this point. He has several what appear to be small vesicular lesions surrounding the glans of the penis on the foreskin. Unfortunately these are also somewhat painful. Fortunately there is no signs of systemic infection though I am more concerned at this point especially about infection. I am going to attempt to rupture and collect samples to send for culture today. 08/10/2019; spent 2 months since the patient was in  this clinic. He has been following with Dr. Phineas Douglas of infectious disease and apparently Dr. Karsten Ro of urology. I have not previously seen this problem. I note that he has had treatment with oral antibiotics for presumed balanitis with some improvement but this does not really solve the problem. He apparently has had tests for syphilis but not herpes simplex. He has been using Santyl on  these areas although I am not sure that that is really the correct treatment here. He has an appointment with Dr. Karsten Ro next week who is apparently going to biopsy this area especially in the one on the tip of his penis Electronic Signature(s) Signed: 08/10/2019 6:00:23 PM By: Linton Ham MD Entered By: Linton Ham on 08/10/2019 17:11:49 -------------------------------------------------------------------------------- Physical Exam Details Patient Name: Date of Service: [name], [place] [name] 08/10/2019 2:30 PM Medical Record Number: [id number] Patient Account Number: [id number] Date of Birth/Sex: Treating RN: 13-Mar-1966 (54 y.o. Marvis Repress Primary Care Provider: [name] Other Clinician: Referring Provider: Treating Provider/Extender: Tessie Eke in Treatment: 12 Notes Wound exam; the patient has what looks to be a necrotic area on the tip of his penis. He also has white plaques on the foreskin circumferentially. Also noted well firm lymph nodes in the left inguinal area Electronic Signature(s) Signed: 08/10/2019 6:00:23 PM By: Linton Ham MD Entered By: Linton Ham on 08/10/2019 17:13:17 -------------------------------------------------------------------------------- Physician Orders Details Patient Name: Date of Service: [name], MA [place] L. 08/10/2019 2:30 PM Medical Record Number: [id number] Patient Account Number: [id number] Date of Birth/Sex: Treating RN: 1965/12/04 (54 y.o. Marvis Repress Primary Care Provider: [name] Other Clinician: Referring Provider: Treating Provider/Extender: Tessie Eke in Treatment: 12 Verbal / Phone Orders: No Diagnosis Coding ICD-10 Coding Code Description N48.5 Ulcer of penis N18.6 End stage renal disease Z99.2 Dependence on renal dialysis E11.622 Type 2 diabetes mellitus with other skin ulcer Discharge From [facility] Discharge  from [place] - call if you need to see [place]. Dressing Change Frequency Wound #1 Penis Change dressing every day. Wound Cleansing Wound #1 Penis May shower and wash wound with soap and water. Primary Wound Dressing Wound #1 Penis Other: - antibiotic ointment Secondary Dressing Wound #1 Penis Kerlix/Rolled Gauze Notes Follow-Up With Urology for Biopsy Electronic Signature(s) Signed: 08/10/2019 5:37:38 PM By: Kela Millin Signed: 08/10/2019 6:00:23 PM By: Linton Ham MD Entered By: Kela Millin on 08/10/2019 15:48:44 -------------------------------------------------------------------------------- Problem List Details Patient Name: Date of Service: [name], MA [place] L. 08/10/2019 2:30 PM Medical Record Number: [id number] Patient Account Number: [id number] Date of Birth/Sex: Treating RN: Oct 12, 1965 (54 y.o. Marvis Repress Primary Care Provider: [name] Other Clinician: Referring Provider: Treating Provider/Extender: Tessie Eke in Treatment: 12 Active Problems ICD-10 Encounter Code Description Active Date MDM Diagnosis N48.5 Ulcer of penis 05/16/2019 No Yes N18.6 End stage renal disease 05/16/2019 No Yes Z99.2 Dependence on renal dialysis 05/16/2019 No Yes E11.622 Type 2 diabetes mellitus with other skin ulcer 05/16/2019 No Yes Inactive Problems Resolved Problems Electronic Signature(s) Signed: 08/10/2019 6:00:23 PM By: Linton Ham MD Entered By: Linton Ham on 08/10/2019 17:10:05 -------------------------------------------------------------------------------- Progress Note Details Patient Name: Date of Service: [name], [name] 08/10/2019 2:30 PM Medical Record Number: [id number] Patient Account Number: [id number] Date of Birth/Sex: Treating RN: 08-19-1965 (54 y.o. Marvis Repress Primary Care Provider: [name] Other Clinician: Referring Provider: Treating Provider/Extender: Tessie Eke in Treatment: 12 Subjective History of Present Illness (HPI) 05/16/2019 patient  presents today for initial evaluation here in our clinic concerning an issue he has been having with a wound on the dorsal surface of the distal portion of his penis. This has been present he tells me for about 2 months I do have a note from the urologist which actually states 4 months. 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 With that being said the patient has also been seen by his primary care provider he tells [place] that he has been screened for sexually transmitted diseases including syphilis although we did contact his primary care provider as well who states that they did not have any testing on him as such. Urology also did not perform any testing. The patient tells me he has not been sexually active since 2014 that he has not had any ability to do so. He is on dialysis and unfortunately is having a lot of issues as far as sexual dysfunction is concerned. 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 He tells me that the ulcer came out first as a bump on the end of his penis which was tender to touch. He continue to watch this and unfortunately as things progressed he states that it eventually opened, ulcerated, and has been extremely painful. He really does not urinate secondary to being on dialysis and his end-stage renal disease. He does have diabetes mellitus type 2. 05/23/2019 upon evaluation today patient's wound on his penis actually appears to be doing better compared to last week's evaluation. Unfortunately he still is having significant pain but there is really not necrotic tissue noted as badly as it was at the last visit. Overall I am pleased in this regard. We  still have not been able to get the result back from the RPR testing that he had done at [facility]. We have called them throughout the day today and we have not been able to get to talk with anyone yet. Obviously as soon as we get the results of this we will have to likely give them a call not to make him stay here all day. 05/30/2019 upon evaluation today fortunately we finally got the patient's RPR test back and it was negative. With that being said he is continuing to have some issues with pain. No fevers, chills, nausea, vomiting, or diarrhea. Overall I do feel like the wound is making good progress however 06/13/2019 upon evaluation today patient unfortunately appears to be doing more poorly in regard to the wound at this point. He has several what appear to be small vesicular lesions surrounding the glans of the penis on the foreskin. Unfortunately these are also somewhat painful. Fortunately there is no signs of systemic infection though I am more concerned at this point especially about infection. I am going to attempt to rupture and collect samples to send for culture today. 08/10/2019; spent 2 months since the patient was in this clinic. He has been following with Dr. Phineas Douglas of infectious disease and apparently Dr. Karsten Ro of urology. I have not previously seen this problem. I note that he has had treatment with oral antibiotics for presumed balanitis with some improvement but this does not really solve the problem. He apparently has had tests for syphilis but not herpes simplex. He has been using Santyl on these areas although  I am not sure that that is really the correct treatment here. He has an appointment with Dr. Karsten Ro next week who is apparently going to biopsy this area especially in the one on the tip of his penis Objective Constitutional Vitals Time Taken: 2:33 PM, Height: 65 in, Weight: 240 lbs, BMI: 39.9, Temperature: 98.2 F, Pulse: 101 bpm, Respiratory Rate: 18 breaths/min,  Blood Pressure: 101/50 mmHg. Integumentary (Hair, Skin) Wound #1 status is Open. Original cause of wound was Gradually Appeared. The wound is located on the Penis. The wound measures 2.5cm length x 3.5cm width x 0.1cm depth; 6.872cm^2 area and 0.687cm^3 volume. There is Fat Layer (Subcutaneous Tissue) Exposed exposed. There is no tunneling or undermining noted. There is a medium amount of serosanguineous drainage noted. The wound margin is flat and intact. There is small (1-33%) pink, pale granulation within the wound bed. There is a large (67-100%) amount of necrotic tissue within the wound bed including Adherent Slough. Assessment Active Problems ICD-10 Ulcer of penis End stage renal disease Dependence on renal dialysis Type 2 diabetes mellitus with other skin ulcer Plan Discharge From [facility] Services: Discharge from [place] - call if you need to see [place]. Dressing Change Frequency: Wound #1 Penis: Change dressing every day. Wound Cleansing: Wound #1 Penis: May shower and wash wound with soap and water. Primary Wound Dressing: Wound #1 Penis: Other: - antibiotic ointment Secondary Dressing: Wound #1 Penis: Kerlix/Rolled Gauze General Notes: Follow-Up With Urology for Biopsy 1. I think the area on his penis needs to be biopsied. If it was not for the white plaques on his shaft I would wonder about a malignancy. He does have lymphadenopathy in the left inguinal areao Atypical infection question malignancy 2. I told him I did not think the Santyl was the right thing to could continue here I would simply use topical antibiotics to keep this clean and uninfected. He is changing this daily. 3. I really do not have a good idea what could be causing both of these problems. For now I do not think we need to follow this closely I would turn this back over to urology. [place] in case somebody thinks we can be helpful Electronic Signature(s) Signed: 08/10/2019 6:00:23 PM By: Linton Ham MD Entered By: Linton Ham on 08/10/2019 17:15:19 -------------------------------------------------------------------------------- SuperBill Details Patient Name: Date of Service: [name], MA [place] L. 08/10/2019 Medical Record Number: [id number] Patient Account Number: [id number] Date of Birth/Sex: Treating RN: 1965/09/07 (54 y.o. Marvis Repress Primary Care Provider: [name] Other Clinician: Referring Provider: Treating Provider/Extender: Tessie Eke in Treatment: 12 Diagnosis Coding ICD-10 Codes Code Description N48.5 Ulcer of penis N18.6 End stage renal disease Z99.2 Dependence on renal dialysis E11.622 Type 2 diabetes mellitus with other skin ulcer Facility Procedures CPT4 Code: [id number] Description: 99213 - WOUND CARE VISIT-LEV 3 EST PT Modifier: Quantity: 1 Physician Procedures : CPT4 Code Description Modifier [id number] - WC PHYS LEVEL 3 - EST PT ICD-10 Diagnosis Description N48.5 Ulcer of penis Quantity: 1 Electronic Signature(s) Signed: 08/10/2019 6:00:23 PM By: Linton Ham MD Entered By: Linton Ham on 08/10/2019 17:15:35

## 2019-08-11 DIAGNOSIS — N2581 Secondary hyperparathyroidism of renal origin: Secondary | ICD-10-CM | POA: Diagnosis not present

## 2019-08-11 DIAGNOSIS — N186 End stage renal disease: Secondary | ICD-10-CM | POA: Diagnosis not present

## 2019-08-11 DIAGNOSIS — D631 Anemia in chronic kidney disease: Secondary | ICD-10-CM | POA: Diagnosis not present

## 2019-08-13 NOTE — Progress Notes (Signed)
Subjective:   Patient ID: [name] [name], male   DOB: 54 y.o.   MRN: [id number]   HPI 54 year old male presents the office with concerns of thick, discolored toenails that he cannot trim himself.  He has noticed the right big toenail has been coming off.  Denies any redness or drainage or any swelling to the toenail sites.  He is diabetic and his last A1c was 7.8.  Is on dialysis Tuesday, Thursday, Saturday.  No history of ulcerations reports.   Review of Systems  All other systems reviewed and are negative.  Past Medical History:  Diagnosis Date  . Anal infection    posterior anal canal  . Anemia, chronic renal failure   . Atrophic kidney    BILATERAL  . DM type 2 causing ESRD [facility])    Nephrologist-- dr [name] [facility])--  on hemodialysis since June 2012 at  [name]  TTS  . Hemodialysis patient [facility])    at [place] on Tues/ Thur/Sat/schedule  . Hemorrhoids   . Hepatitis B antibody positive   . History of pleural effusion    bilateral  . Hyperparathyroidism, secondary renal ([place])   . Hypertension   . Ischemic cardiomyopathy    per echo 07-01-2014  ef 45%  . LAFB (left anterior fascicular block)   . Peripheral neuropathy   . Systolic and diastolic CHF, chronic ([place])    CARDIOLOGIST-  DR Daneen Schick ([place])  AND DR [name] ([name])    Past Surgical History:  Procedure Laterality Date  . APPENDECTOMY  09-12-2004   laparotomy w/ drainage peritinitis  . AV FISTULA PLACEMENT  02-27-2010   right forearm (RADIOCEPHALIC)  . AV FISTULA REPAIR  10-30-2010  . CARDIOVASCULAR STRESS TEST  10-29-2011   dr Daneen Schick   Low risk scan;  mild perfusion defect seen in the basal inferoseptal, basal inferior and mid inferior regions consistent with an infarct/scar and/or overlying attenuation/  mild to moderate global LVSF,  ef 40-45%  . DOBUTAMINE STRESS ECHO  07-23-2012   [name]   abnormal ;  at rest estimated lvef 25-30%  and global severe LV hypokinesis ;  no cp during stress and achieved 85% maxium predicted heart rate;  negative stress ECG for inducible ischemia;  estimated lvef with stress 35-40%;  augmentation of wall segments consistant with cardiomyopathy and differential fibrosis  . FISTULOTOMY N/A 03/26/2015   Procedure: FISTULOTOMY;  Surgeon: [name], MD;  Location: [facility];  Service: General;  Laterality: N/A;  . INCISION AND DRAINAGE ABSCESS N/A 03/26/2015   Procedure: ANAL INCISION AND DRAINAGE;  Surgeon: [name], MD;  Location: [facility];  Service: General;  Laterality: N/A;  . RETINAL DETACHMENT SURGERY Left 2011   incomplete repair/ needs eye drops to keep pressure down  . TEE WITHOUT CARDIOVERSION N/A 10/17/2017   Procedure: TRANSESOPHAGEAL ECHOCARDIOGRAM (TEE);  Surgeon: Josue Hector, MD;  Location: [facility] ENDOSCOPY;  Service: Cardiovascular;  Laterality: N/A;  . TRANSTHORACIC ECHOCARDIOGRAM  07-01-2014    done at [facility]   grade 1 diastolic dysfunction,  ef 45%/  trace TR and PR     Current Outpatient Medications:  .  acetaminophen (TYLENOL) 500 MG tablet, Take 1,000 mg by mouth as needed., Disp: , Rfl:  .  allopurinol (ZYLOPRIM) 100 MG tablet, Take 100 mg by mouth daily., Disp: , Rfl:  .  amoxicillin-clavulanate (AUGMENTIN) 875-125 MG tablet, Take 1 tablet by mouth 2 (two)  times daily. (Patient not taking: Reported on 08/08/2019), Disp: 14 tablet, Rfl: 1 .  aspirin 325 MG tablet, Take 325 mg by mouth daily., Disp: , Rfl:  .  calcium acetate (PHOSLO) 667 MG capsule, Take 2,001 mg by mouth 3 (three) times daily with meals. , Disp: , Rfl:  .  celecoxib (CELEBREX) 100 MG capsule, Take 100 mg by mouth daily., Disp: , Rfl:  .  collagenase (SANTYL) ointment, Apply 1 application topically daily., Disp: 15 g, Rfl: 0 .  cyanocobalamin 1000 MCG tablet, Take by mouth., Disp: , Rfl:  .  cycloSPORINE (RESTASIS) 0.05 % ophthalmic emulsion, Place 1  drop into both eyes 2 (two) times daily., Disp: , Rfl:  .  febuxostat (ULORIC) 40 MG tablet, Take 40 mg by mouth daily. , Disp: , Rfl:  .  ferric citrate (AURYXIA) 1 GM 210 MG(Fe) tablet, Take 630 mg by mouth 3 (three) times daily. , Disp: , Rfl:  .  gabapentin (NEURONTIN) 300 MG capsule, Take 600 mg by mouth as needed. For pain, Disp: , Rfl: 3 .  HUMALOG 100 UNIT/ML injection, 50U/DAY VIA PUMP SUBCUTANEOUSLY, Disp: , Rfl:  .  lidocaine (XYLOCAINE) 5 % ointment, APPLY TOPICALLY THREE TIMES DAILY FOR PAIN OF FINGERS, Disp: , Rfl:  .  midodrine (PROAMATINE) 5 MG tablet, Take 5 mg by mouth 3 (three) times a week. Every Tuesday Thursday and Saturday, Disp: , Rfl:  .  Multiple Vitamins-Minerals (MEGA MULTIVITAMIN FOR MEN PO), Take 1 tablet by mouth daily. , Disp: , Rfl:  .  NON FORMULARY, humalog omnipod three times daily adjusted to food intake, Disp: , Rfl:  .  ondansetron (ZOFRAN) 4 MG tablet, Take 4 mg by mouth 3 (three) times daily as needed., Disp: , Rfl:  .  oxyCODONE-acetaminophen (PERCOCET) 10-325 MG tablet, Take 1 tablet by mouth every 4 (four) hours as needed for pain. , Disp: , Rfl:  .  prochlorperazine (COMPAZINE) 5 MG tablet, Take 5 mg by mouth every 6 (six) hours as needed., Disp: , Rfl:  .  triamcinolone cream (KENALOG) 0.1 %, APPLY TO AFFECTED AREAS OF TRUNK AND EXTREMITIES TWICE A DAY, Disp: , Rfl:   No Known Allergies        Objective:  Physical Exam  General: AAO x3, NAD  Dermatological: Nails are hypertrophic, dystrophic, brittle, discolored, elongated 10.  There is minimal nail groove from the right hallux toenail.  There is nail on the proximal nail fold which is firmly adhered.  No signs of infection.  No surrounding redness or drainage. Tenderness nails 1-5 bilaterally. No open lesions or pre-ulcerative lesions are identified today.   Vascular: Dorsalis Pedis artery and Posterior Tibial artery pedal pulses are 1/4 bilateral with immedate capillary fill time. There is  no pain with calf compression, swelling, warmth, erythema.   Neruologic: Grossly intact via light touch bilateral.  Musculoskeletal: No pain, crepitus, or limitation noted with foot and ankle range of motion bilateral. Muscular strength 5/5 in all groups tested bilateral.      Assessment:   Symptomatic onychomycosis    Plan:  -Treatment options discussed including all alternatives, risks, and complications -Etiology of symptoms were discussed -Nails debrided 10 without complications or bleeding. -Daily foot inspection -Follow-up in 3 months or sooner if any problems arise. In the meantime, encouraged to call the office with any questions, concerns, change in symptoms.   Celesta Gentile, DPM

## 2019-08-14 DIAGNOSIS — N2581 Secondary hyperparathyroidism of renal origin: Secondary | ICD-10-CM | POA: Diagnosis not present

## 2019-08-14 DIAGNOSIS — N186 End stage renal disease: Secondary | ICD-10-CM | POA: Diagnosis not present

## 2019-08-14 DIAGNOSIS — D631 Anemia in chronic kidney disease: Secondary | ICD-10-CM | POA: Diagnosis not present

## 2019-08-14 NOTE — Progress Notes (Signed)
SHANA, YOUNGE ([id number]) Visit Report for 08/10/2019 Arrival Information Details Patient Name: Date of Service: [place], [name] 08/10/2019 2:30 PM Medical Record Number: [id number] Patient Account Number: [id number] Date of Birth/Sex: Treating RN: 1965-09-28 (54 y.o. Jerilynn Mages) Carlene Coria Primary Care Orin Eberwein: [name] Other Clinician: Referring Keren Alverio: Treating Jacolyn Joaquin/Extender: Tessie Eke in Treatment: 12 Visit Information History Since Last Visit All ordered tests and consults were completed: No Patient Arrived: Kasandra Knudsen Added or deleted any medications: No Arrival Time: 14:33 Any new allergies or adverse reactions: No Accompanied By: alone Had a fall or experienced change in No Transfer Assistance: None activities of daily living that may affect Patient Identification Verified: Yes risk of falls: Secondary Verification Process Completed: Yes Signs or symptoms of abuse/neglect since last visito No Patient Requires Transmission-Based Precautions: No Hospitalized since last visit: No Patient Has Alerts: No Implantable device outside of the clinic excluding No cellular tissue based products placed in the center since last visit: Has Dressing in Place as Prescribed: Yes Pain Present Now: No Electronic Signature(s) Signed: 08/14/2019 5:17:39 PM By: Carlene Coria RN Entered By: Carlene Coria on 08/10/2019 14:33:44 -------------------------------------------------------------------------------- Clinic Level of Care Assessment Details Patient Name: Date of Service: [place], [name] 08/10/2019 2:30 PM Medical Record Number: [id number] Patient Account Number: [id number] Date of Birth/Sex: Treating RN: 12-07-1965 (54 y.o. Marvis Repress Primary Care Yanelis Osika: [name] Other Clinician: Referring Deondra Wigger: Treating Sybil Shrader/Extender: Tessie Eke in Treatment: 12 Clinic Level of Care Assessment  Items TOOL 4 Quantity Score X- 1 0 Use when only an EandM is performed on FOLLOW-UP visit ASSESSMENTS - Nursing Assessment / Reassessment X- 1 10 Reassessment of Co-morbidities (includes updates in patient status) X- 1 5 Reassessment of Adherence to Treatment Plan ASSESSMENTS - Wound and Skin A ssessment / Reassessment X - Simple Wound Assessment / Reassessment - one wound 1 5 []  - 0 Complex Wound Assessment / Reassessment - multiple wounds []  - 0 Dermatologic / Skin Assessment (not related to wound area) ASSESSMENTS - Focused Assessment []  - 0 Circumferential Edema Measurements - multi extremities []  - 0 Nutritional Assessment / Counseling / Intervention []  - 0 Lower Extremity Assessment (monofilament, tuning fork, pulses) []  - 0 Peripheral Arterial Disease Assessment (using hand held doppler) ASSESSMENTS - Ostomy and/or Continence Assessment and Care []  - 0 Incontinence Assessment and Management []  - 0 Ostomy Care Assessment and Management (repouching, etc.) PROCESS - Coordination of Care X - Simple Patient / Family Education for ongoing care 1 15 []  - 0 Complex (extensive) Patient / Family Education for ongoing care X- 1 10 Staff obtains Programmer, systems, Records, T Results / Process Orders est []  - 0 Staff telephones HHA, Nursing Homes / Clarify orders / etc []  - 0 Routine Transfer to another Facility (non-emergent condition) []  - 0 Routine Hospital Admission (non-emergent condition) []  - 0 New Admissions / Biomedical engineer / Ordering NPWT Apligraf, etc. , []  - 0 Emergency Hospital Admission (emergent condition) X- 1 10 Simple Discharge Coordination []  - 0 Complex (extensive) Discharge Coordination PROCESS - Special Needs []  - 0 Pediatric / Minor Patient Management []  - 0 Isolation Patient Management []  - 0 Hearing / Language / Visual special needs []  - 0 Assessment of Community assistance (transportation, D/C planning, etc.) []  - 0 Additional  assistance / Altered mentation []  - 0 Support Surface(s) Assessment (bed, cushion, seat, etc.) INTERVENTIONS - Wound Cleansing / Measurement X - Simple Wound Cleansing - one wound 1 5 []  -  0 Complex Wound Cleansing - multiple wounds X- 1 5 Wound Imaging (photographs - any number of wounds) []  - 0 Wound Tracing (instead of photographs) X- 1 5 Simple Wound Measurement - one wound []  - 0 Complex Wound Measurement - multiple wounds INTERVENTIONS - Wound Dressings X - Small Wound Dressing one or multiple wounds 1 10 []  - 0 Medium Wound Dressing one or multiple wounds []  - 0 Large Wound Dressing one or multiple wounds X- 1 5 Application of Medications - topical []  - 0 Application of Medications - injection INTERVENTIONS - Miscellaneous []  - 0 External ear exam []  - 0 Specimen Collection (cultures, biopsies, blood, body fluids, etc.) []  - 0 Specimen(s) / Culture(s) sent or taken to Lab for analysis []  - 0 Patient Transfer (multiple staff / Civil Service fast streamer / Similar devices) []  - 0 Simple Staple / Suture removal (25 or less) []  - 0 Complex Staple / Suture removal (26 or more) []  - 0 Hypo / Hyperglycemic Management (close monitor of Blood Glucose) []  - 0 Ankle / Brachial Index (ABI) - do not check if billed separately X- 1 5 Vital Signs Has the patient been seen at the hospital within the last three years: Yes Total Score: 90 Level Of Care: New/Established - Level 3 Electronic Signature(s) Signed: 08/10/2019 5:37:38 PM By: Kela Millin Entered By: Kela Millin on 08/10/2019 15:53:15 -------------------------------------------------------------------------------- Multi Wound Chart Details Patient Name: Date of Service: [name], MA [name] 08/10/2019 2:30 PM Medical Record Number: [id number] Patient Account Number: [id number] Date of Birth/Sex: Treating RN: 11/22/1965 (54 y.o. Marvis Repress Primary Care Chaz Mcglasson: [name] Other Clinician: Referring  Arlone Lenhardt: Treating Ryaan Vanwagoner/Extender: Tessie Eke in Treatment: 12 Vital Signs Height(in): 65 Pulse(bpm): 101 Weight(lbs): 240 Blood Pressure(mmHg): 101/50 Body Mass Index(BMI): 40 Temperature(F): 98.2 Respiratory Rate(breaths/min): 18 Photos: [1:No Photos Penis] [N/A:N/A N/A] Wound Location: [1:Gradually Appeared] [N/A:N/A] Wounding Event: [1:Atypical] [N/A:N/A] Primary Etiology: [1:Anemia, Congestive Heart Failure,] [N/A:N/A] Comorbid History: [1:Hypertension, Type II Diabetes, End Stage Renal Disease, Raynauds, Neuropathy 01/21/2019] [N/A:N/A] Date Acquired: [1:12] [N/A:N/A] Weeks of Treatment: [1:Open] [N/A:N/A] Wound Status: [1:Yes] [N/A:N/A] Clustered Wound: [1:2] [N/A:N/A] Clustered Quantity: [1:2.5x3.5x0.1] [N/A:N/A] Measurements L x W x D (cm) [1:6.872] [N/A:N/A] A (cm) : rea [1:0.687] [N/A:N/A] Volume (cm) : [1:-40.00%] [N/A:N/A] % Reduction in Area: [1:30.00%] [N/A:N/A] % Reduction in Volume: [1:Full Thickness Without Exposed] [N/A:N/A] Classification: [1:Support Structures Medium] [N/A:N/A] Exudate Amount: [1:Serosanguineous] [N/A:N/A] Exudate Type: [1:red, brown] [N/A:N/A] Exudate Color: [1:Flat and Intact] [N/A:N/A] Wound Margin: [1:Small (1-33%)] [N/A:N/A] Granulation Amount: [1:Pink, Pale] [N/A:N/A] Granulation Quality: [1:Large (67-100%)] [N/A:N/A] Necrotic Amount: [1:Fat Layer (Subcutaneous Tissue)] [N/A:N/A] Exposed Structures: [1:Exposed: Yes Fascia: No Tendon: No Muscle: No Joint: No Bone: No Small (1-33%)] [N/A:N/A] Treatment Notes Electronic Signature(s) Signed: 08/10/2019 5:37:38 PM By: Kela Millin Signed: 08/10/2019 6:00:23 PM By: Linton Ham MD Entered By: Linton Ham on 08/10/2019 17:10:12 -------------------------------------------------------------------------------- Multi-Disciplinary Care Plan Details Patient Name: Date of Service: [name], MA [name] 08/10/2019 2:30 PM Medical Record Number:  [id number] Patient Account Number: [id number] Date of Birth/Sex: Treating RN: [birth year] (54 y.o. Marvis Repress Primary Care Lc Joynt: [name] Other Clinician: Referring Terran Klinke: Treating Lakeeta Dobosz/Extender: Tessie Eke in Treatment: 12 Active Inactive Electronic Signature(s) Signed: 08/10/2019 5:37:38 PM By: Kela Millin Entered By: Kela Millin on 08/10/2019 15:57:50 -------------------------------------------------------------------------------- Pain Assessment Details Patient Name: Date of Service: [name], [place] L. 08/10/2019 2:30 PM Medical Record Number: [id number] Patient Account Number: [id number] Date of Birth/Sex: Treating RN: June 13, 1965 (54 y.o. Jerilynn Mages) Dolores Lory, [place]  Primary Care Kaytelynn Scripter: [name] Other Clinician: Referring Tesneem Dufrane: Treating Kaikoa Magro/Extender: Tessie Eke in Treatment: 12 Active Problems Location of Pain Severity and Description of Pain Patient Has Paino No Site Locations Pain Management and Medication Current Pain Management: Electronic Signature(s) Signed: 08/14/2019 5:17:39 PM By: Carlene Coria RN Entered By: Carlene Coria on 08/10/2019 14:34:27 -------------------------------------------------------------------------------- Patient/Caregiver Education Details Patient Name: Date of Service: [name], Jacklynn Bue 5/21/2021andnbsp2:30 PM Medical Record Number: [id number] Patient Account Number: [id number] Date of Birth/Gender: Treating RN: 12-Dec-1965 (54 y.o. Marvis Repress Primary Care Physician: [name] Other Clinician: Referring Physician: Treating Physician/Extender: Tessie Eke in Treatment: 12 Education Assessment Education Provided To: Patient Education Topics Provided Wound/Skin Impairment: Handouts: Caring for Your Ulcer Methods: Explain/Verbal Responses: State content correctly Electronic  Signature(s) Signed: 08/10/2019 5:37:38 PM By: Kela Millin Entered By: Kela Millin on 08/10/2019 15:35:41 -------------------------------------------------------------------------------- Wound Assessment Details Patient Name: Date of Service: [name], [place] L. 08/10/2019 2:30 PM Medical Record Number: [id number] Patient Account Number: [id number] Date of Birth/Sex: Treating RN: [birth year] (54 y.o. Jerilynn Mages) Carlene Coria Primary Care Zakia Sainato: [name] Other Clinician: Referring Shloimy Michalski: Treating Tarren Sabree/Extender: Tessie Eke in Treatment: 12 Wound Status Wound Number: 1 Primary Atypical Etiology: Wound Location: Penis Wound Open Wounding Event: Gradually Appeared Status: Date Acquired: 01/21/2019 Comorbid Anemia, Congestive Heart Failure, Hypertension, Type II Diabetes, Weeks Of Treatment: 12 History: End Stage Renal Disease, Raynauds, Neuropathy Clustered Wound: Yes Wound Measurements Length: (cm) 2.5 Width: (cm) 3.5 Depth: (cm) 0.1 Clustered Quantity: 2 Area: (cm) 6.872 Volume: (cm) 0.687 % Reduction in Area: -40% % Reduction in Volume: 30% Epithelialization: Small (1-33%) Tunneling: No Undermining: No Wound Description Classification: Full Thickness Without Exposed Support Structures Wound Margin: Flat and Intact Exudate Amount: Medium Exudate Type: Serosanguineous Exudate Color: red, brown Foul Odor After Cleansing: No Slough/Fibrino Yes Wound Bed Granulation Amount: Small (1-33%) Exposed Structure Granulation Quality: Pink, Pale Fascia Exposed: No Necrotic Amount: Large (67-100%) Fat Layer (Subcutaneous Tissue) Exposed: Yes Necrotic Quality: Adherent Slough Tendon Exposed: No Muscle Exposed: No Joint Exposed: No Bone Exposed: No Electronic Signature(s) Signed: 08/14/2019 5:17:39 PM By: Carlene Coria RN Entered By: Carlene Coria on 08/10/2019  14:38:44 -------------------------------------------------------------------------------- Vitals Details Patient Name: Date of Service: [name], MA [name] 08/10/2019 2:30 PM Medical Record Number: [id number] Patient Account Number: [id number] Date of Birth/Sex: Treating RN: 1965-03-29 (54 y.o. Jerilynn Mages) Carlene Coria Primary Care Juanangel Soderholm: [name] Other Clinician: Referring Gardner Servantes: Treating Gaurav Baldree/Extender: Tessie Eke in Treatment: 12 Vital Signs Time Taken: 14:33 Temperature (F): 98.2 Height (in): 65 Pulse (bpm): 101 Weight (lbs): 240 Respiratory Rate (breaths/min): 18 Body Mass Index (BMI): 39.9 Blood Pressure (mmHg): 101/50 Reference Range: 80 - 120 mg / dl Electronic Signature(s) Signed: 08/14/2019 5:17:39 PM By: Carlene Coria RN Entered By: Carlene Coria on 08/10/2019 14:34:19

## 2019-08-15 DIAGNOSIS — N4829 Other inflammatory disorders of penis: Secondary | ICD-10-CM | POA: Diagnosis not present

## 2019-08-16 DIAGNOSIS — N186 End stage renal disease: Secondary | ICD-10-CM | POA: Diagnosis not present

## 2019-08-16 DIAGNOSIS — D631 Anemia in chronic kidney disease: Secondary | ICD-10-CM | POA: Diagnosis not present

## 2019-08-16 DIAGNOSIS — N2581 Secondary hyperparathyroidism of renal origin: Secondary | ICD-10-CM | POA: Diagnosis not present

## 2019-08-17 DIAGNOSIS — N186 End stage renal disease: Secondary | ICD-10-CM | POA: Diagnosis not present

## 2019-08-17 DIAGNOSIS — Z9641 Presence of insulin pump (external) (internal): Secondary | ICD-10-CM | POA: Diagnosis not present

## 2019-08-17 DIAGNOSIS — E1165 Type 2 diabetes mellitus with hyperglycemia: Secondary | ICD-10-CM | POA: Diagnosis not present

## 2019-08-17 DIAGNOSIS — G609 Hereditary and idiopathic neuropathy, unspecified: Secondary | ICD-10-CM | POA: Diagnosis not present

## 2019-08-17 DIAGNOSIS — I10 Essential (primary) hypertension: Secondary | ICD-10-CM | POA: Diagnosis not present

## 2019-08-18 DIAGNOSIS — N186 End stage renal disease: Secondary | ICD-10-CM | POA: Diagnosis not present

## 2019-08-18 DIAGNOSIS — N2581 Secondary hyperparathyroidism of renal origin: Secondary | ICD-10-CM | POA: Diagnosis not present

## 2019-08-18 DIAGNOSIS — D631 Anemia in chronic kidney disease: Secondary | ICD-10-CM | POA: Diagnosis not present

## 2019-08-20 DIAGNOSIS — Z992 Dependence on renal dialysis: Secondary | ICD-10-CM | POA: Diagnosis not present

## 2019-08-20 DIAGNOSIS — N186 End stage renal disease: Secondary | ICD-10-CM | POA: Diagnosis not present

## 2019-08-24 DIAGNOSIS — M79646 Pain in unspecified finger(s): Secondary | ICD-10-CM | POA: Diagnosis not present

## 2019-08-24 DIAGNOSIS — M2550 Pain in unspecified joint: Secondary | ICD-10-CM | POA: Diagnosis not present

## 2019-08-24 DIAGNOSIS — G894 Chronic pain syndrome: Secondary | ICD-10-CM | POA: Diagnosis not present

## 2019-08-24 DIAGNOSIS — M792 Neuralgia and neuritis, unspecified: Secondary | ICD-10-CM | POA: Diagnosis not present

## 2019-09-20 DIAGNOSIS — N186 End stage renal disease: Secondary | ICD-10-CM | POA: Diagnosis not present

## 2019-09-20 DIAGNOSIS — D631 Anemia in chronic kidney disease: Secondary | ICD-10-CM | POA: Diagnosis not present

## 2019-09-20 DIAGNOSIS — N2581 Secondary hyperparathyroidism of renal origin: Secondary | ICD-10-CM | POA: Diagnosis not present

## 2019-09-20 DIAGNOSIS — E785 Hyperlipidemia, unspecified: Secondary | ICD-10-CM | POA: Diagnosis not present

## 2019-09-22 DIAGNOSIS — D631 Anemia in chronic kidney disease: Secondary | ICD-10-CM | POA: Diagnosis not present

## 2019-09-22 DIAGNOSIS — N186 End stage renal disease: Secondary | ICD-10-CM | POA: Diagnosis not present

## 2019-09-22 DIAGNOSIS — E785 Hyperlipidemia, unspecified: Secondary | ICD-10-CM | POA: Diagnosis not present

## 2019-09-22 DIAGNOSIS — N2581 Secondary hyperparathyroidism of renal origin: Secondary | ICD-10-CM | POA: Diagnosis not present

## 2019-09-25 DIAGNOSIS — N186 End stage renal disease: Secondary | ICD-10-CM | POA: Diagnosis not present

## 2019-09-25 DIAGNOSIS — E785 Hyperlipidemia, unspecified: Secondary | ICD-10-CM | POA: Diagnosis not present

## 2019-09-25 DIAGNOSIS — N2581 Secondary hyperparathyroidism of renal origin: Secondary | ICD-10-CM | POA: Diagnosis not present

## 2019-09-25 DIAGNOSIS — D631 Anemia in chronic kidney disease: Secondary | ICD-10-CM | POA: Diagnosis not present

## 2019-09-27 DIAGNOSIS — D631 Anemia in chronic kidney disease: Secondary | ICD-10-CM | POA: Diagnosis not present

## 2019-09-27 DIAGNOSIS — N186 End stage renal disease: Secondary | ICD-10-CM | POA: Diagnosis not present

## 2019-09-27 DIAGNOSIS — E119 Type 2 diabetes mellitus without complications: Secondary | ICD-10-CM | POA: Diagnosis not present

## 2019-09-27 DIAGNOSIS — N2581 Secondary hyperparathyroidism of renal origin: Secondary | ICD-10-CM | POA: Diagnosis not present

## 2019-09-27 DIAGNOSIS — E785 Hyperlipidemia, unspecified: Secondary | ICD-10-CM | POA: Diagnosis not present

## 2019-09-29 DIAGNOSIS — D631 Anemia in chronic kidney disease: Secondary | ICD-10-CM | POA: Diagnosis not present

## 2019-09-29 DIAGNOSIS — E785 Hyperlipidemia, unspecified: Secondary | ICD-10-CM | POA: Diagnosis not present

## 2019-09-29 DIAGNOSIS — N2581 Secondary hyperparathyroidism of renal origin: Secondary | ICD-10-CM | POA: Diagnosis not present

## 2019-09-29 DIAGNOSIS — N186 End stage renal disease: Secondary | ICD-10-CM | POA: Diagnosis not present

## 2019-10-02 DIAGNOSIS — N2581 Secondary hyperparathyroidism of renal origin: Secondary | ICD-10-CM | POA: Diagnosis not present

## 2019-10-02 DIAGNOSIS — N186 End stage renal disease: Secondary | ICD-10-CM | POA: Diagnosis not present

## 2019-10-02 DIAGNOSIS — D631 Anemia in chronic kidney disease: Secondary | ICD-10-CM | POA: Diagnosis not present

## 2019-10-02 DIAGNOSIS — E785 Hyperlipidemia, unspecified: Secondary | ICD-10-CM | POA: Diagnosis not present

## 2019-10-04 DIAGNOSIS — D631 Anemia in chronic kidney disease: Secondary | ICD-10-CM | POA: Diagnosis not present

## 2019-10-04 DIAGNOSIS — N2581 Secondary hyperparathyroidism of renal origin: Secondary | ICD-10-CM | POA: Diagnosis not present

## 2019-10-04 DIAGNOSIS — N186 End stage renal disease: Secondary | ICD-10-CM | POA: Diagnosis not present

## 2019-10-04 DIAGNOSIS — E785 Hyperlipidemia, unspecified: Secondary | ICD-10-CM | POA: Diagnosis not present

## 2019-10-06 DIAGNOSIS — E785 Hyperlipidemia, unspecified: Secondary | ICD-10-CM | POA: Diagnosis not present

## 2019-10-06 DIAGNOSIS — N2581 Secondary hyperparathyroidism of renal origin: Secondary | ICD-10-CM | POA: Diagnosis not present

## 2019-10-06 DIAGNOSIS — N186 End stage renal disease: Secondary | ICD-10-CM | POA: Diagnosis not present

## 2019-10-06 DIAGNOSIS — D631 Anemia in chronic kidney disease: Secondary | ICD-10-CM | POA: Diagnosis not present

## 2019-10-09 DIAGNOSIS — N2581 Secondary hyperparathyroidism of renal origin: Secondary | ICD-10-CM | POA: Diagnosis not present

## 2019-10-09 DIAGNOSIS — E785 Hyperlipidemia, unspecified: Secondary | ICD-10-CM | POA: Diagnosis not present

## 2019-10-09 DIAGNOSIS — N186 End stage renal disease: Secondary | ICD-10-CM | POA: Diagnosis not present

## 2019-10-09 DIAGNOSIS — D631 Anemia in chronic kidney disease: Secondary | ICD-10-CM | POA: Diagnosis not present

## 2019-10-11 DIAGNOSIS — D631 Anemia in chronic kidney disease: Secondary | ICD-10-CM | POA: Diagnosis not present

## 2019-10-11 DIAGNOSIS — N186 End stage renal disease: Secondary | ICD-10-CM | POA: Diagnosis not present

## 2019-10-11 DIAGNOSIS — E785 Hyperlipidemia, unspecified: Secondary | ICD-10-CM | POA: Diagnosis not present

## 2019-10-11 DIAGNOSIS — N2581 Secondary hyperparathyroidism of renal origin: Secondary | ICD-10-CM | POA: Diagnosis not present

## 2019-10-13 DIAGNOSIS — D631 Anemia in chronic kidney disease: Secondary | ICD-10-CM | POA: Diagnosis not present

## 2019-10-13 DIAGNOSIS — E785 Hyperlipidemia, unspecified: Secondary | ICD-10-CM | POA: Diagnosis not present

## 2019-10-13 DIAGNOSIS — N186 End stage renal disease: Secondary | ICD-10-CM | POA: Diagnosis not present

## 2019-10-13 DIAGNOSIS — N2581 Secondary hyperparathyroidism of renal origin: Secondary | ICD-10-CM | POA: Diagnosis not present

## 2019-10-15 DIAGNOSIS — R599 Enlarged lymph nodes, unspecified: Secondary | ICD-10-CM | POA: Diagnosis not present

## 2019-10-15 DIAGNOSIS — D638 Anemia in other chronic diseases classified elsewhere: Secondary | ICD-10-CM | POA: Diagnosis not present

## 2019-10-15 DIAGNOSIS — Z79899 Other long term (current) drug therapy: Secondary | ICD-10-CM | POA: Diagnosis not present

## 2019-10-15 DIAGNOSIS — E0842 Diabetes mellitus due to underlying condition with diabetic polyneuropathy: Secondary | ICD-10-CM | POA: Diagnosis not present

## 2019-10-15 DIAGNOSIS — M899 Disorder of bone, unspecified: Secondary | ICD-10-CM | POA: Diagnosis not present

## 2019-10-15 DIAGNOSIS — I5030 Unspecified diastolic (congestive) heart failure: Secondary | ICD-10-CM | POA: Diagnosis not present

## 2019-10-15 DIAGNOSIS — N186 End stage renal disease: Secondary | ICD-10-CM | POA: Diagnosis not present

## 2019-10-15 DIAGNOSIS — E790 Hyperuricemia without signs of inflammatory arthritis and tophaceous disease: Secondary | ICD-10-CM | POA: Diagnosis not present

## 2019-10-15 DIAGNOSIS — M79609 Pain in unspecified limb: Secondary | ICD-10-CM | POA: Diagnosis not present

## 2019-10-15 DIAGNOSIS — E1142 Type 2 diabetes mellitus with diabetic polyneuropathy: Secondary | ICD-10-CM | POA: Diagnosis not present

## 2019-10-15 DIAGNOSIS — G5690 Unspecified mononeuropathy of unspecified upper limb: Secondary | ICD-10-CM | POA: Diagnosis not present

## 2019-10-15 DIAGNOSIS — Z79891 Long term (current) use of opiate analgesic: Secondary | ICD-10-CM | POA: Diagnosis not present

## 2019-10-15 DIAGNOSIS — Z125 Encounter for screening for malignant neoplasm of prostate: Secondary | ICD-10-CM | POA: Diagnosis not present

## 2019-10-15 DIAGNOSIS — E7800 Pure hypercholesterolemia, unspecified: Secondary | ICD-10-CM | POA: Diagnosis not present

## 2019-10-16 DIAGNOSIS — N2581 Secondary hyperparathyroidism of renal origin: Secondary | ICD-10-CM | POA: Diagnosis not present

## 2019-10-16 DIAGNOSIS — N186 End stage renal disease: Secondary | ICD-10-CM | POA: Diagnosis not present

## 2019-10-16 DIAGNOSIS — D631 Anemia in chronic kidney disease: Secondary | ICD-10-CM | POA: Diagnosis not present

## 2019-10-16 DIAGNOSIS — E785 Hyperlipidemia, unspecified: Secondary | ICD-10-CM | POA: Diagnosis not present

## 2019-10-18 DIAGNOSIS — D631 Anemia in chronic kidney disease: Secondary | ICD-10-CM | POA: Diagnosis not present

## 2019-10-18 DIAGNOSIS — E785 Hyperlipidemia, unspecified: Secondary | ICD-10-CM | POA: Diagnosis not present

## 2019-10-18 DIAGNOSIS — N2581 Secondary hyperparathyroidism of renal origin: Secondary | ICD-10-CM | POA: Diagnosis not present

## 2019-10-18 DIAGNOSIS — N186 End stage renal disease: Secondary | ICD-10-CM | POA: Diagnosis not present

## 2019-10-19 DIAGNOSIS — N4829 Other inflammatory disorders of penis: Secondary | ICD-10-CM | POA: Diagnosis not present

## 2019-10-20 DIAGNOSIS — N186 End stage renal disease: Secondary | ICD-10-CM | POA: Diagnosis not present

## 2019-10-20 DIAGNOSIS — N2581 Secondary hyperparathyroidism of renal origin: Secondary | ICD-10-CM | POA: Diagnosis not present

## 2019-10-20 DIAGNOSIS — D631 Anemia in chronic kidney disease: Secondary | ICD-10-CM | POA: Diagnosis not present

## 2019-10-20 DIAGNOSIS — E785 Hyperlipidemia, unspecified: Secondary | ICD-10-CM | POA: Diagnosis not present

## 2019-10-20 DIAGNOSIS — Z992 Dependence on renal dialysis: Secondary | ICD-10-CM | POA: Diagnosis not present

## 2019-10-23 DIAGNOSIS — N2581 Secondary hyperparathyroidism of renal origin: Secondary | ICD-10-CM | POA: Diagnosis not present

## 2019-10-23 DIAGNOSIS — N186 End stage renal disease: Secondary | ICD-10-CM | POA: Diagnosis not present

## 2019-10-23 DIAGNOSIS — D631 Anemia in chronic kidney disease: Secondary | ICD-10-CM | POA: Diagnosis not present

## 2019-10-25 DIAGNOSIS — N2581 Secondary hyperparathyroidism of renal origin: Secondary | ICD-10-CM | POA: Diagnosis not present

## 2019-10-25 DIAGNOSIS — D631 Anemia in chronic kidney disease: Secondary | ICD-10-CM | POA: Diagnosis not present

## 2019-10-25 DIAGNOSIS — N186 End stage renal disease: Secondary | ICD-10-CM | POA: Diagnosis not present

## 2019-10-25 DIAGNOSIS — E119 Type 2 diabetes mellitus without complications: Secondary | ICD-10-CM | POA: Diagnosis not present

## 2019-10-26 DIAGNOSIS — M792 Neuralgia and neuritis, unspecified: Secondary | ICD-10-CM | POA: Diagnosis not present

## 2019-10-26 DIAGNOSIS — S68119A Complete traumatic metacarpophalangeal amputation of unspecified finger, initial encounter: Secondary | ICD-10-CM | POA: Diagnosis not present

## 2019-10-26 DIAGNOSIS — M79646 Pain in unspecified finger(s): Secondary | ICD-10-CM | POA: Diagnosis not present

## 2019-10-26 DIAGNOSIS — G894 Chronic pain syndrome: Secondary | ICD-10-CM | POA: Diagnosis not present

## 2019-10-27 DIAGNOSIS — D631 Anemia in chronic kidney disease: Secondary | ICD-10-CM | POA: Diagnosis not present

## 2019-10-27 DIAGNOSIS — N2581 Secondary hyperparathyroidism of renal origin: Secondary | ICD-10-CM | POA: Diagnosis not present

## 2019-10-27 DIAGNOSIS — N186 End stage renal disease: Secondary | ICD-10-CM | POA: Diagnosis not present

## 2019-10-30 DIAGNOSIS — D631 Anemia in chronic kidney disease: Secondary | ICD-10-CM | POA: Diagnosis not present

## 2019-10-30 DIAGNOSIS — N2581 Secondary hyperparathyroidism of renal origin: Secondary | ICD-10-CM | POA: Diagnosis not present

## 2019-10-30 DIAGNOSIS — N186 End stage renal disease: Secondary | ICD-10-CM | POA: Diagnosis not present

## 2019-11-01 DIAGNOSIS — N2581 Secondary hyperparathyroidism of renal origin: Secondary | ICD-10-CM | POA: Diagnosis not present

## 2019-11-01 DIAGNOSIS — N186 End stage renal disease: Secondary | ICD-10-CM | POA: Diagnosis not present

## 2019-11-01 DIAGNOSIS — D631 Anemia in chronic kidney disease: Secondary | ICD-10-CM | POA: Diagnosis not present

## 2019-11-03 DIAGNOSIS — N186 End stage renal disease: Secondary | ICD-10-CM | POA: Diagnosis not present

## 2019-11-03 DIAGNOSIS — D631 Anemia in chronic kidney disease: Secondary | ICD-10-CM | POA: Diagnosis not present

## 2019-11-03 DIAGNOSIS — N2581 Secondary hyperparathyroidism of renal origin: Secondary | ICD-10-CM | POA: Diagnosis not present

## 2019-11-05 DIAGNOSIS — L988 Other specified disorders of the skin and subcutaneous tissue: Secondary | ICD-10-CM | POA: Diagnosis not present

## 2019-11-06 DIAGNOSIS — D631 Anemia in chronic kidney disease: Secondary | ICD-10-CM | POA: Diagnosis not present

## 2019-11-06 DIAGNOSIS — N186 End stage renal disease: Secondary | ICD-10-CM | POA: Diagnosis not present

## 2019-11-06 DIAGNOSIS — N2581 Secondary hyperparathyroidism of renal origin: Secondary | ICD-10-CM | POA: Diagnosis not present

## 2019-11-07 ENCOUNTER — Ambulatory Visit (INDEPENDENT_AMBULATORY_CARE_PROVIDER_SITE_OTHER): Payer: Medicare Other | Admitting: Podiatry

## 2019-11-07 ENCOUNTER — Encounter: Payer: Self-pay | Admitting: Podiatry

## 2019-11-07 ENCOUNTER — Other Ambulatory Visit: Payer: Self-pay

## 2019-11-07 DIAGNOSIS — Z992 Dependence on renal dialysis: Secondary | ICD-10-CM | POA: Diagnosis not present

## 2019-11-07 DIAGNOSIS — E1151 Type 2 diabetes mellitus with diabetic peripheral angiopathy without gangrene: Secondary | ICD-10-CM

## 2019-11-07 DIAGNOSIS — B351 Tinea unguium: Secondary | ICD-10-CM

## 2019-11-07 DIAGNOSIS — N186 End stage renal disease: Secondary | ICD-10-CM

## 2019-11-07 DIAGNOSIS — Z8249 Family history of ischemic heart disease and other diseases of the circulatory system: Secondary | ICD-10-CM | POA: Diagnosis not present

## 2019-11-07 DIAGNOSIS — M79674 Pain in right toe(s): Secondary | ICD-10-CM

## 2019-11-07 DIAGNOSIS — M79675 Pain in left toe(s): Secondary | ICD-10-CM

## 2019-11-07 DIAGNOSIS — E119 Type 2 diabetes mellitus without complications: Secondary | ICD-10-CM

## 2019-11-08 DIAGNOSIS — D631 Anemia in chronic kidney disease: Secondary | ICD-10-CM | POA: Diagnosis not present

## 2019-11-08 DIAGNOSIS — N186 End stage renal disease: Secondary | ICD-10-CM | POA: Diagnosis not present

## 2019-11-08 DIAGNOSIS — N2581 Secondary hyperparathyroidism of renal origin: Secondary | ICD-10-CM | POA: Diagnosis not present

## 2019-11-10 DIAGNOSIS — D631 Anemia in chronic kidney disease: Secondary | ICD-10-CM | POA: Diagnosis not present

## 2019-11-10 DIAGNOSIS — N2581 Secondary hyperparathyroidism of renal origin: Secondary | ICD-10-CM | POA: Diagnosis not present

## 2019-11-10 DIAGNOSIS — N186 End stage renal disease: Secondary | ICD-10-CM | POA: Diagnosis not present

## 2019-11-10 NOTE — Progress Notes (Signed)
Subjective:  Patient ID: [name] [name], male    DOB: Aug 03, 1965,  MRN: [id number]  54 y.o. male presents with at risk foot care. Pt has h/o NIDDM with PAD and painful thick toenails that are difficult to trim. Pain interferes with ambulation. Aggravating factors include wearing enclosed shoe gear. Pain is relieved with periodic professional debridement.   He has h/o Raynaud's with amputation of fingers 2, 3, 4 of left hand and index finger of right hand.  Patient states right great toenail fell off. Denies any redness, drainage or swelling.   Review of Systems: Negative except as noted in the HPI.  Past Medical History:  Diagnosis Date  . Anal infection    posterior anal canal  . Anemia, chronic renal failure   . Atrophic kidney    BILATERAL  . DM type 2 causing ESRD [facility])    Nephrologist-- dr [name] [facility])--  on hemodialysis since June 2012 at  [name]  TTS  . Hemodialysis patient [facility])    at [place] on Tues/ Thur/Sat/schedule  . Hemorrhoids   . Hepatitis B antibody positive   . History of pleural effusion    bilateral  . Hyperparathyroidism, secondary renal ([place])   . Hypertension   . Ischemic cardiomyopathy    per echo 07-01-2014  ef 45%  . LAFB (left anterior fascicular block)   . Peripheral neuropathy   . Systolic and diastolic CHF, chronic ([place])    CARDIOLOGIST-  DR Daneen Schick ([place])  AND DR [name] ([name])   Past Surgical History:  Procedure Laterality Date  . APPENDECTOMY  09-12-2004   laparotomy w/ drainage peritinitis  . AV FISTULA PLACEMENT  02-27-2010   right forearm (RADIOCEPHALIC)  . AV FISTULA REPAIR  10-30-2010  . CARDIOVASCULAR STRESS TEST  10-29-2011   dr Daneen Schick   Low risk scan;  mild perfusion defect seen in the basal inferoseptal, basal inferior and mid inferior regions consistent with an infarct/scar and/or overlying attenuation/  mild to moderate global LVSF,  ef 40-45%    . DOBUTAMINE STRESS ECHO  07-23-2012   [name]   abnormal ;  at rest estimated lvef 25-30% and global severe LV hypokinesis ;  no cp during stress and achieved 85% maxium predicted heart rate;  negative stress ECG for inducible ischemia;  estimated lvef with stress 35-40%;  augmentation of wall segments consistant with cardiomyopathy and differential fibrosis  . FISTULOTOMY N/A 03/26/2015   Procedure: FISTULOTOMY;  Surgeon: [name], MD;  Location: [facility];  Service: General;  Laterality: N/A;  . INCISION AND DRAINAGE ABSCESS N/A 03/26/2015   Procedure: ANAL INCISION AND DRAINAGE;  Surgeon: [name], MD;  Location: [place];  Service: General;  Laterality: N/A;  . RETINAL DETACHMENT SURGERY Left 2011   incomplete repair/ needs eye drops to keep pressure down  . TEE WITHOUT CARDIOVERSION N/A 10/17/2017   Procedure: TRANSESOPHAGEAL ECHOCARDIOGRAM (TEE);  Surgeon: Josue Hector, MD;  Location: [facility] ENDOSCOPY;  Service: Cardiovascular;  Laterality: N/A;  . TRANSTHORACIC ECHOCARDIOGRAM  07-01-2014    done at [facility]   grade 1 diastolic dysfunction,  ef 45%/  trace TR and PR   Patient Active Problem List   Diagnosis Date Noted  . Medication monitoring encounter 07/25/2019  . Penile mass 07/16/2019  . 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 Acute GI bleeding 10/13/2017  . ESRD on hemodialysis ([place]) 10/13/2017  .  Embolism, arterial ([place]) 10/12/2017  . Diabetes mellitus type 2, uncontrolled ([place]) 05/03/2014  . Non-ischemic cardiomyopathy ([place]) 05/03/2014  . HYPERCHOLESTEROLEMIA 08/19/2009  . Essential hypertension 08/19/2009  . Chronic systolic heart failure ([place]) 08/19/2009  . CKD (chronic kidney disease) stage V requiring chronic dialysis ([place]) 08/19/2009  . Depression 08/19/2009    Current Outpatient Medications:  .  acetaminophen (TYLENOL) 500 MG tablet, Take 1,000 mg by mouth as needed., Disp: , Rfl:  .   allopurinol (ZYLOPRIM) 100 MG tablet, Take 100 mg by mouth daily., Disp: , Rfl:  .  amoxicillin-clavulanate (AUGMENTIN) 875-125 MG tablet, Take 1 tablet by mouth 2 (two) times daily. (Patient not taking: Reported on 08/08/2019), Disp: 14 tablet, Rfl: 1 .  aspirin 325 MG tablet, Take 325 mg by mouth daily., Disp: , Rfl:  .  calcium acetate (PHOSLO) 667 MG capsule, Take 2,001 mg by mouth 3 (three) times daily with meals. , Disp: , Rfl:  .  celecoxib (CELEBREX) 100 MG capsule, Take 100 mg by mouth daily., Disp: , Rfl:  .  collagenase (SANTYL) ointment, Apply 1 application topically daily., Disp: 15 g, Rfl: 0 .  cyanocobalamin 1000 MCG tablet, Take by mouth., Disp: , Rfl:  .  cycloSPORINE (RESTASIS) 0.05 % ophthalmic emulsion, Place 1 drop into both eyes 2 (two) times daily., Disp: , Rfl:  .  febuxostat (ULORIC) 40 MG tablet, Take 40 mg by mouth daily. , Disp: , Rfl:  .  ferric citrate (AURYXIA) 1 GM 210 MG(Fe) tablet, Take 630 mg by mouth 3 (three) times daily. , Disp: , Rfl:  .  gabapentin (NEURONTIN) 300 MG capsule, Take 600 mg by mouth as needed. For pain, Disp: , Rfl: 3 .  HUMALOG 100 UNIT/ML injection, 50U/DAY VIA PUMP SUBCUTANEOUSLY, Disp: , Rfl:  .  Insulin Disposable Pump (OMNIPOD DASH 5 PACK PODS) MISC, Inject into the skin., Disp: , Rfl:  .  lidocaine (XYLOCAINE) 5 % ointment, APPLY TOPICALLY THREE TIMES DAILY FOR PAIN OF FINGERS, Disp: , Rfl:  .  midodrine (PROAMATINE) 5 MG tablet, Take 5 mg by mouth 3 (three) times a week. Every Tuesday Thursday and Saturday, Disp: , Rfl:  .  Multiple Vitamins-Minerals (MEGA MULTIVITAMIN FOR MEN PO), Take 1 tablet by mouth daily. , Disp: , Rfl:  .  NON FORMULARY, humalog omnipod three times daily adjusted to food intake, Disp: , Rfl:  .  ondansetron (ZOFRAN) 4 MG tablet, Take 4 mg by mouth 3 (three) times daily as needed., Disp: , Rfl:  .  Oxycodone HCl 10 MG TABS, Take 10 mg by mouth 3 (three) times daily., Disp: , Rfl:  .  oxyCODONE-acetaminophen  (PERCOCET) 10-325 MG tablet, Take 1 tablet by mouth every 4 (four) hours as needed for pain. , Disp: , Rfl:  .  prochlorperazine (COMPAZINE) 5 MG tablet, Take 5 mg by mouth every 6 (six) hours as needed., Disp: , Rfl:  .  triamcinolone cream (KENALOG) 0.1 %, APPLY TO AFFECTED AREAS OF TRUNK AND EXTREMITIES TWICE A DAY, Disp: , Rfl:  No Known Allergies Social History   Tobacco Use  Smoking Status Never Smoker  Smokeless Tobacco Never Used   Objective:  There were no vitals filed for this visit. Constitutional Patient is a pleasant 54 y.o. African American male obese in NAD.[name] AAO x 3.  Vascular Capillary refill time to digits <4 seconds b/l lower extremities. Faintly palpable pedal pulses b/l. Pedal hair absent. Lower extremity skin temperature gradient within normal limits. No pain with calf compression  b/l. No edema noted b/l lower extremities. No ischemia or gangrene noted b/l lower extremities. No cyanosis or clubbing noted.  Neurologic Normal speech. Oriented to person, place, and time. Protective sensation intact 5/5 intact bilaterally with 10g monofilament b/l.  Dermatologic Pedal skin with normal turgor, texture and tone bilaterally. No open wounds bilaterally. No interdigital macerations bilaterally. Toenails 2-5 bilaterally and L hallux elongated, discolored, dystrophic, thickened, and crumbly with subungual debris and tenderness to dorsal palpation. Anonychia noted R hallux. Nailbed(s) epithelialized.   Orthopedic: Normal muscle strength 5/5 to all lower extremity muscle groups bilaterally. No pain crepitus or joint limitation noted with ROM b/l. No gross bony deformities bilaterally.   Assessment:   1. Pain due to onychomycosis of toenails of both feet   2. Type II diabetes mellitus with peripheral circulatory disorder ([name])   3. Family history of Raynaud's phenomenon   4. ESRD on hemodialysis ([place])   5. Encounter for diabetic foot exam ([place])      Risk Categorization: High  Risk  Patient has one or more of the following: History of digital amputation of fingers History of Raynaud's phenomenon Loss of protective sensation Absent pedal pulses Severe Foot deformity History of foot ulcer  Plan:  Patient was evaluated and treated and all questions answered.  Onychomycosis with pain -Nails palliatively debridement as below. -Educated on self-care  Procedure: Nail Debridement Rationale: Pain Type of Debridement: manual, sharp debridement. Instrumentation: Nail nipper, rotary burr. Number of Nails: 9  -Examined patient. -No new findings. No new orders. -Diabetic foot examination performed on today's visit.  -Discussed Raynaud's with him and informed him Cardiologist would be the one to monitor his vascular status. He related understanding. -Continue diabetic foot care principles. -Toenails 2-5 bilaterally and L hallux debrided in length and girth without iatrogenic bleeding with sterile nail nipper and dremel. Right hallux stable.  -Patient to report any pedal injuries to medical professional immediately. -Patient to continue soft, supportive shoe gear daily. -Patient/POA to call should there be question/concern in the interim.  Return in about 3 months (around 02/07/2020) for diabetic nail trim.  Marzetta Board, DPM

## 2019-11-13 DIAGNOSIS — N2581 Secondary hyperparathyroidism of renal origin: Secondary | ICD-10-CM | POA: Diagnosis not present

## 2019-11-13 DIAGNOSIS — D631 Anemia in chronic kidney disease: Secondary | ICD-10-CM | POA: Diagnosis not present

## 2019-11-13 DIAGNOSIS — N186 End stage renal disease: Secondary | ICD-10-CM | POA: Diagnosis not present

## 2019-11-15 DIAGNOSIS — D631 Anemia in chronic kidney disease: Secondary | ICD-10-CM | POA: Diagnosis not present

## 2019-11-15 DIAGNOSIS — N186 End stage renal disease: Secondary | ICD-10-CM | POA: Diagnosis not present

## 2019-11-15 DIAGNOSIS — N2581 Secondary hyperparathyroidism of renal origin: Secondary | ICD-10-CM | POA: Diagnosis not present

## 2019-11-16 DIAGNOSIS — N2581 Secondary hyperparathyroidism of renal origin: Secondary | ICD-10-CM | POA: Diagnosis not present

## 2019-11-16 DIAGNOSIS — N186 End stage renal disease: Secondary | ICD-10-CM | POA: Diagnosis not present

## 2019-11-16 DIAGNOSIS — D631 Anemia in chronic kidney disease: Secondary | ICD-10-CM | POA: Diagnosis not present

## 2019-11-20 DIAGNOSIS — Z992 Dependence on renal dialysis: Secondary | ICD-10-CM | POA: Diagnosis not present

## 2019-11-20 DIAGNOSIS — D631 Anemia in chronic kidney disease: Secondary | ICD-10-CM | POA: Diagnosis not present

## 2019-11-20 DIAGNOSIS — N2581 Secondary hyperparathyroidism of renal origin: Secondary | ICD-10-CM | POA: Diagnosis not present

## 2019-11-20 DIAGNOSIS — N186 End stage renal disease: Secondary | ICD-10-CM | POA: Diagnosis not present

## 2019-11-22 DIAGNOSIS — E8779 Other fluid overload: Secondary | ICD-10-CM | POA: Diagnosis not present

## 2019-11-22 DIAGNOSIS — E119 Type 2 diabetes mellitus without complications: Secondary | ICD-10-CM | POA: Diagnosis not present

## 2019-11-22 DIAGNOSIS — N2581 Secondary hyperparathyroidism of renal origin: Secondary | ICD-10-CM | POA: Diagnosis not present

## 2019-11-22 DIAGNOSIS — N186 End stage renal disease: Secondary | ICD-10-CM | POA: Diagnosis not present

## 2019-11-22 DIAGNOSIS — D631 Anemia in chronic kidney disease: Secondary | ICD-10-CM | POA: Diagnosis not present

## 2019-11-23 DIAGNOSIS — M2550 Pain in unspecified joint: Secondary | ICD-10-CM | POA: Diagnosis not present

## 2019-11-23 DIAGNOSIS — M792 Neuralgia and neuritis, unspecified: Secondary | ICD-10-CM | POA: Diagnosis not present

## 2019-11-23 DIAGNOSIS — G894 Chronic pain syndrome: Secondary | ICD-10-CM | POA: Diagnosis not present

## 2019-11-23 DIAGNOSIS — M79646 Pain in unspecified finger(s): Secondary | ICD-10-CM | POA: Diagnosis not present

## 2019-11-24 DIAGNOSIS — E8779 Other fluid overload: Secondary | ICD-10-CM | POA: Diagnosis not present

## 2019-11-24 DIAGNOSIS — D631 Anemia in chronic kidney disease: Secondary | ICD-10-CM | POA: Diagnosis not present

## 2019-11-24 DIAGNOSIS — N186 End stage renal disease: Secondary | ICD-10-CM | POA: Diagnosis not present

## 2019-11-24 DIAGNOSIS — N2581 Secondary hyperparathyroidism of renal origin: Secondary | ICD-10-CM | POA: Diagnosis not present

## 2019-11-27 DIAGNOSIS — E8779 Other fluid overload: Secondary | ICD-10-CM | POA: Diagnosis not present

## 2019-11-27 DIAGNOSIS — D631 Anemia in chronic kidney disease: Secondary | ICD-10-CM | POA: Diagnosis not present

## 2019-11-27 DIAGNOSIS — N186 End stage renal disease: Secondary | ICD-10-CM | POA: Diagnosis not present

## 2019-11-27 DIAGNOSIS — N2581 Secondary hyperparathyroidism of renal origin: Secondary | ICD-10-CM | POA: Diagnosis not present

## 2019-11-28 ENCOUNTER — Other Ambulatory Visit (HOSPITAL_COMMUNITY): Payer: Self-pay | Admitting: Urology

## 2019-11-28 DIAGNOSIS — N4829 Other inflammatory disorders of penis: Secondary | ICD-10-CM

## 2019-11-29 DIAGNOSIS — N2581 Secondary hyperparathyroidism of renal origin: Secondary | ICD-10-CM | POA: Diagnosis not present

## 2019-11-29 DIAGNOSIS — E8779 Other fluid overload: Secondary | ICD-10-CM | POA: Diagnosis not present

## 2019-11-29 DIAGNOSIS — N186 End stage renal disease: Secondary | ICD-10-CM | POA: Diagnosis not present

## 2019-11-29 DIAGNOSIS — D631 Anemia in chronic kidney disease: Secondary | ICD-10-CM | POA: Diagnosis not present

## 2019-11-30 ENCOUNTER — Other Ambulatory Visit: Payer: Self-pay

## 2019-11-30 ENCOUNTER — Ambulatory Visit (HOSPITAL_COMMUNITY)
Admission: RE | Admit: 2019-11-30 | Discharge: 2019-11-30 | Disposition: A | Discharge status: Home / Self Care | Payer: Medicare Other | Source: Ambulatory Visit | Attending: Urology | Admitting: Urology

## 2019-11-30 DIAGNOSIS — N4829 Other inflammatory disorders of penis: Secondary | ICD-10-CM | POA: Diagnosis not present

## 2019-11-30 DIAGNOSIS — R600 Localized edema: Secondary | ICD-10-CM | POA: Diagnosis not present

## 2019-11-30 DIAGNOSIS — N4889 Other specified disorders of penis: Secondary | ICD-10-CM | POA: Diagnosis not present

## 2019-11-30 DIAGNOSIS — R590 Localized enlarged lymph nodes: Secondary | ICD-10-CM | POA: Diagnosis not present

## 2019-11-30 MED ORDER — GADOBUTROL 1 MMOL/ML IV SOLN
10.0000 mL | Freq: Once | INTRAVENOUS | Status: AC | PRN
  Administered 2019-11-30: 10 mL via INTRAVENOUS

## 2019-12-01 DIAGNOSIS — D631 Anemia in chronic kidney disease: Secondary | ICD-10-CM | POA: Diagnosis not present

## 2019-12-01 DIAGNOSIS — N2581 Secondary hyperparathyroidism of renal origin: Secondary | ICD-10-CM | POA: Diagnosis not present

## 2019-12-01 DIAGNOSIS — N186 End stage renal disease: Secondary | ICD-10-CM | POA: Diagnosis not present

## 2019-12-01 DIAGNOSIS — E8779 Other fluid overload: Secondary | ICD-10-CM | POA: Diagnosis not present

## 2019-12-03 DIAGNOSIS — L988 Other specified disorders of the skin and subcutaneous tissue: Secondary | ICD-10-CM | POA: Diagnosis not present

## 2019-12-04 DIAGNOSIS — D631 Anemia in chronic kidney disease: Secondary | ICD-10-CM | POA: Diagnosis not present

## 2019-12-04 DIAGNOSIS — E8779 Other fluid overload: Secondary | ICD-10-CM | POA: Diagnosis not present

## 2019-12-04 DIAGNOSIS — N2581 Secondary hyperparathyroidism of renal origin: Secondary | ICD-10-CM | POA: Diagnosis not present

## 2019-12-04 DIAGNOSIS — N186 End stage renal disease: Secondary | ICD-10-CM | POA: Diagnosis not present

## 2019-12-06 ENCOUNTER — Other Ambulatory Visit: Payer: Self-pay | Admitting: Internal Medicine

## 2019-12-06 DIAGNOSIS — E8779 Other fluid overload: Secondary | ICD-10-CM | POA: Diagnosis not present

## 2019-12-06 DIAGNOSIS — N2581 Secondary hyperparathyroidism of renal origin: Secondary | ICD-10-CM | POA: Diagnosis not present

## 2019-12-06 DIAGNOSIS — D631 Anemia in chronic kidney disease: Secondary | ICD-10-CM | POA: Diagnosis not present

## 2019-12-06 DIAGNOSIS — N186 End stage renal disease: Secondary | ICD-10-CM | POA: Diagnosis not present

## 2019-12-08 DIAGNOSIS — E8779 Other fluid overload: Secondary | ICD-10-CM | POA: Diagnosis not present

## 2019-12-08 DIAGNOSIS — N186 End stage renal disease: Secondary | ICD-10-CM | POA: Diagnosis not present

## 2019-12-08 DIAGNOSIS — N2581 Secondary hyperparathyroidism of renal origin: Secondary | ICD-10-CM | POA: Diagnosis not present

## 2019-12-08 DIAGNOSIS — D631 Anemia in chronic kidney disease: Secondary | ICD-10-CM | POA: Diagnosis not present

## 2019-12-11 DIAGNOSIS — N186 End stage renal disease: Secondary | ICD-10-CM | POA: Diagnosis not present

## 2019-12-11 DIAGNOSIS — N2581 Secondary hyperparathyroidism of renal origin: Secondary | ICD-10-CM | POA: Diagnosis not present

## 2019-12-11 DIAGNOSIS — D631 Anemia in chronic kidney disease: Secondary | ICD-10-CM | POA: Diagnosis not present

## 2019-12-11 DIAGNOSIS — E8779 Other fluid overload: Secondary | ICD-10-CM | POA: Diagnosis not present

## 2019-12-12 DIAGNOSIS — D631 Anemia in chronic kidney disease: Secondary | ICD-10-CM | POA: Diagnosis not present

## 2019-12-12 DIAGNOSIS — N186 End stage renal disease: Secondary | ICD-10-CM | POA: Diagnosis not present

## 2019-12-12 DIAGNOSIS — N2581 Secondary hyperparathyroidism of renal origin: Secondary | ICD-10-CM | POA: Diagnosis not present

## 2019-12-12 DIAGNOSIS — E8779 Other fluid overload: Secondary | ICD-10-CM | POA: Diagnosis not present

## 2019-12-15 DIAGNOSIS — D631 Anemia in chronic kidney disease: Secondary | ICD-10-CM | POA: Diagnosis not present

## 2019-12-15 DIAGNOSIS — E8779 Other fluid overload: Secondary | ICD-10-CM | POA: Diagnosis not present

## 2019-12-15 DIAGNOSIS — N2581 Secondary hyperparathyroidism of renal origin: Secondary | ICD-10-CM | POA: Diagnosis not present

## 2019-12-15 DIAGNOSIS — N186 End stage renal disease: Secondary | ICD-10-CM | POA: Diagnosis not present

## 2019-12-15 NOTE — Progress Notes (Signed)
Cardiology Office Note:    Date:  12/17/2019   ID:  [name] [name], DOB 1965/08/19, MRN [id number]  PCP:  [name], MD  Cardiologist:  Sinclair Grooms, MD   Referring MD: [name], MD   Chief Complaint  Patient presents with  . Coronary Artery Disease  . Congestive Heart Failure  . Advice Only    CKD V    History of Present Illness:    [name] [name] is a 54 y.o. 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  [name] [name] continues to have difficulty without obvious explanation for certain findings such as abdominal lymphadenopathy, digital ulceration and tissue loss on both hands, chronic low blood pressure with subsequent lethargy and difficulty ambulating, and difficulty tolerating dialysis.  He is on midodrine for blood pressure support during dialysis.  Cardiac evaluation has been performed however none recently.  Not currently on transplant list's.  His nephrologist is [name] [name] M.D.  Past Medical History:  Diagnosis Date  . Anal infection    posterior anal canal  . Anemia, chronic renal failure   . Atrophic kidney    BILATERAL  . DM type 2 causing ESRD [facility])    Nephrologist-- dr [name] [facility])--  on hemodialysis since June 2012 at  [name]  TTS  . Hemodialysis patient [facility])    at [place] on Tues/ Thur/Sat/schedule  . Hemorrhoids   . Hepatitis B antibody positive   . History of pleural effusion    bilateral  . Hyperparathyroidism, secondary renal ([place])   . Hypertension   . Ischemic cardiomyopathy    per echo  07-01-2014  ef 45%  . LAFB (left anterior fascicular block)   . Peripheral neuropathy   . Systolic and diastolic CHF, chronic ([place])    CARDIOLOGIST-  DR Daneen Schick ([facility])  AND DR [name] ([name])    Past Surgical History:  Procedure Laterality Date  . APPENDECTOMY  09-12-2004   laparotomy w/ drainage peritinitis  . AV FISTULA PLACEMENT  02-27-2010   right forearm (RADIOCEPHALIC)  . AV FISTULA REPAIR  10-30-2010  . CARDIOVASCULAR STRESS TEST  10-29-2011   dr Daneen Schick   Low risk scan;  mild perfusion defect seen in the basal inferoseptal, basal inferior and mid inferior regions consistent with an infarct/scar and/or overlying attenuation/  mild to moderate global LVSF,  ef 40-45%  . DOBUTAMINE STRESS ECHO  07-23-2012   [name]   abnormal ;  at rest estimated lvef 25-30% and global severe LV hypokinesis ;  no cp during stress and achieved 85% maxium predicted heart rate;  negative stress ECG for inducible ischemia;  estimated lvef with stress 35-40%;  augmentation of wall segments consistant with cardiomyopathy and differential fibrosis  . FISTULOTOMY N/A 03/26/2015   Procedure: FISTULOTOMY;  Surgeon: [name], MD;  Location: [facility];  Service: General;  Laterality: N/A;  . INCISION AND DRAINAGE ABSCESS N/A 03/26/2015   Procedure: ANAL INCISION AND DRAINAGE;  Surgeon: [name], MD;  Location: [place];  Service: General;  Laterality: N/A;  . RETINAL DETACHMENT SURGERY Left 2011  incomplete repair/ needs eye drops to keep pressure down  . TEE WITHOUT CARDIOVERSION N/A 10/17/2017   Procedure: TRANSESOPHAGEAL ECHOCARDIOGRAM (TEE);  Surgeon: Josue Hector, MD;  Location: [facility] ENDOSCOPY;  Service: Cardiovascular;  Laterality: N/A;  . TRANSTHORACIC ECHOCARDIOGRAM  07-01-2014    done at [facility]   grade 1 diastolic dysfunction,  ef 45%/  trace TR and PR    Current Medications: Current Meds   Medication Sig  . acetaminophen (TYLENOL) 500 MG tablet Take 1,000 mg by mouth as needed.  [name] allopurinol (ZYLOPRIM) 100 MG tablet Take 100 mg by mouth daily.  [name] aspirin 325 MG tablet Take 325 mg by mouth daily.  . calcium acetate (PHOSLO) 667 MG capsule Take 2,001 mg by mouth 3 (three) times daily with meals.   . celecoxib (CELEBREX) 100 MG capsule Take 100 mg by mouth daily.  . cyanocobalamin 1000 MCG tablet Take by mouth.  . febuxostat (ULORIC) 40 MG tablet Take 40 mg by mouth daily.   . ferric citrate (AURYXIA) 1 GM 210 MG(Fe) tablet Take 630 mg by mouth 3 (three) times daily.   [name] gabapentin (NEURONTIN) 300 MG capsule Take 600 mg by mouth as needed. For pain  . HUMALOG 100 UNIT/ML injection 50U/DAY VIA PUMP SUBCUTANEOUSLY  . Insulin Disposable Pump (OMNIPOD DASH 5 PACK PODS) MISC Inject into the skin.  [name] lidocaine (XYLOCAINE) 5 % ointment APPLY TOPICALLY THREE TIMES DAILY FOR PAIN OF FINGERS  . midodrine (PROAMATINE) 5 MG tablet Take 5 mg by mouth 3 (three) times a week. Every Tuesday Thursday and Saturday  . Multiple Vitamins-Minerals (MEGA MULTIVITAMIN FOR MEN PO) Take 1 tablet by mouth daily.   . NON FORMULARY humalog omnipod three times daily adjusted to food intake  . ondansetron (ZOFRAN) 4 MG tablet Take 4 mg by mouth 3 (three) times daily as needed.  . Oxycodone HCl 10 MG TABS Take 10 mg by mouth 3 (three) times daily.  . sucroferric oxyhydroxide (VELPHORO) 500 MG chewable tablet Chew 500 mg by mouth 2 (two) times daily.  [name] triamcinolone cream (KENALOG) 0.1 % APPLY TO AFFECTED AREAS OF TRUNK AND EXTREMITIES TWICE A DAY     Allergies:   Patient has no known allergies.   Social History   Socioeconomic History  . Marital status: Single    Spouse name: Not on file  . Number of children: Not on file  . Years of education: Not on file  . Highest education level: Not on file  Occupational History  . Not on file  Tobacco Use  . Smoking status: Never Smoker  . Smokeless tobacco:  Never Used  Vaping Use  . Vaping Use: Never used  Substance and Sexual Activity  . Alcohol use: No  . Drug use: No  . Sexual activity: Not on file  Other Topics Concern  . Not on file  Social History Narrative  . Not on file   Social Determinants of Health   Financial Resource Strain:   . Difficulty of Paying Living Expenses: Not on file  Food Insecurity:   . Worried About Charity fundraiser in the Last Year: Not on file  . Ran Out of Food in the Last Year: Not on file  Transportation Needs:   . Lack of Transportation (Medical): Not on file  . Lack of Transportation (Non-Medical): Not on file  Physical Activity:   . Days of Exercise per Week: Not on file  . Minutes of Exercise per Session: Not on file  Stress:   . Feeling of Stress : Not on file  Social Connections:   . Frequency of Communication with Friends and Family: Not on file  . Frequency of Social Gatherings with Friends and Family: Not on file  . Attends Religious Services: Not on file  . Active Member of Clubs or Organizations: Not on file  . Attends Archivist Meetings: Not on file  . Marital Status: Not on file     Family History: The patient's Family history is unknown by patient.  ROS:   Please see the history of present illness.    Anxiety concerning death of his older brother, of the family members, and concerned about his own mortality.  All other systems reviewed and are negative.  EKGs/Labs/Other Studies Reviewed:    The following studies were reviewed today: 2D Doppler echocardiogram February 2021: IMPRESSIONS    1. Left ventricular ejection fraction, by visual estimation, is 40 to  45%. The left ventricle has mildly decreased function. There is mildly  increased left ventricular hypertrophy.  2. Elevated left atrial and left ventricular end-diastolic pressures.  3. Left ventricular diastolic parameters are consistent with Grade II  diastolic dysfunction (pseudonormalization).   4. The left ventricle demonstrates global hypokinesis.  5. Global right ventricle was not well visualized.The right ventricular  size is not well visualized. Right vetricular wall thickness was not  assessed.  6. Left atrial size was normal.  7. Right atrial size was normal.  8. Mild mitral annular calcification.  9. The mitral valve is abnormal. Trivial mitral valve regurgitation.  10. The tricuspid valve is grossly normal.  11. The tricuspid valve is grossly normal. Tricuspid valve regurgitation  is mild.  12. Aortic valve mean gradient measures 9.0 mmHg.  13. Aortic valve peak gradient measures 17.3 mmHg.  14. The aortic valve is tricuspid. Aortic valve regurgitation is not  visualized. Mild aortic valve sclerosis without stenosis.  15. The pulmonic valve was not well visualized. Pulmonic valve  regurgitation is not visualized.  16. The interatrial septum was not well visualized.   EKG:  EKG the tracing is not repeated.  Recent Labs: 04/12/2019: BUN 26; Creatinine, Ser 9.21; Hemoglobin 10.8; Platelets 329; Potassium 6.3; Sodium 139  Recent Lipid Panel No results found for: CHOL, TRIG, HDL, CHOLHDL, VLDL, LDLCALC, LDLDIRECT  Physical Exam:    VS:  Ht 5\' 5"  (1.651 m)   Wt 245 lb 3.2 oz (111.2 kg)   BMI 40.80 kg/m     Wt Readings from Last 3 Encounters:  12/17/19 245 lb 3.2 oz (111.2 kg)  08/08/19 233 lb (105.7 kg)  07/25/19 243 lb (110.2 kg)     GEN: Obese.  Wearing gloves on both hands with several digits missing on both hands.  Right thumb has a punctate subconjunctival hemorrhage and focal region of necrosis.. No acute distress HEENT: Normal NECK: No JVD. LYMPHATICS: No lymphadenopathy CARDIAC: Distant heart sounds.  RRR without murmur, gallop, or edema. VASCULAR: Difficult to palpate radial and pedal pulses.. No bruits. RESPIRATORY:  Clear to auscultation without rales, wheezing or rhonchi  ABDOMEN: Soft, non-tender, non-distended, No pulsatile mass,  MUSCULOSKELETAL: No deformity  SKIN: Warm and dry NEUROLOGIC:  Alert and oriented x 3 PSYCHIATRIC:  Normal affect   ASSESSMENT:    1. Chronic systolic heart failure ([place])   2. ESRD (end stage renal disease) on dialysis ([place])   3. Essential hypertension   4. Uncontrolled type 2 diabetes mellitus with hyperglycemia ([place])   5. Bilateral lower extremity  edema   6. Educated about COVID-19 virus infection    PLAN:    In order of problems listed above:  1. Unable to use guideline directed therapy because of hypotension.  All medications of lower blood pressure have been discontinued. 2. Continue dialysis per Dr. Willene Hatchet. 3. Difficult to measure on today's exam 4. Followed by Dr. Jacelyn Pi. 5. Unchanged.  Unable to resolve with dialysis. 6. Vaccinated and practicing mitigation.  Stable compared to last office visit.  Continue dialysis.  Unable to use guideline directed therapy for systolic dysfunction due to poor blood pressure related to neuropathy.  No cardiac testing at this time.   Medication Adjustments/Labs and Tests Ordered: Current medicines are reviewed at length with the patient today.  Concerns regarding medicines are outlined above.  No orders of the defined types were placed in this encounter.  No orders of the defined types were placed in this encounter.   There are no Patient Instructions on file for this visit.   Signed, Sinclair Grooms, MD  12/17/2019 4:11 PM    [facility]

## 2019-12-17 ENCOUNTER — Ambulatory Visit (INDEPENDENT_AMBULATORY_CARE_PROVIDER_SITE_OTHER): Payer: Medicare Other | Admitting: Interventional Cardiology

## 2019-12-17 ENCOUNTER — Encounter: Payer: Self-pay | Admitting: Interventional Cardiology

## 2019-12-17 ENCOUNTER — Other Ambulatory Visit: Payer: Self-pay

## 2019-12-17 VITALS — Ht 65.0 in | Wt 245.2 lb

## 2019-12-17 DIAGNOSIS — E1165 Type 2 diabetes mellitus with hyperglycemia: Secondary | ICD-10-CM

## 2019-12-17 DIAGNOSIS — N186 End stage renal disease: Secondary | ICD-10-CM | POA: Diagnosis not present

## 2019-12-17 DIAGNOSIS — Z7189 Other specified counseling: Secondary | ICD-10-CM | POA: Diagnosis not present

## 2019-12-17 DIAGNOSIS — Z992 Dependence on renal dialysis: Secondary | ICD-10-CM | POA: Diagnosis not present

## 2019-12-17 DIAGNOSIS — N2581 Secondary hyperparathyroidism of renal origin: Secondary | ICD-10-CM | POA: Diagnosis not present

## 2019-12-17 DIAGNOSIS — D631 Anemia in chronic kidney disease: Secondary | ICD-10-CM | POA: Diagnosis not present

## 2019-12-17 DIAGNOSIS — I10 Essential (primary) hypertension: Secondary | ICD-10-CM

## 2019-12-17 DIAGNOSIS — I5022 Chronic systolic (congestive) heart failure: Secondary | ICD-10-CM | POA: Diagnosis not present

## 2019-12-17 DIAGNOSIS — E8779 Other fluid overload: Secondary | ICD-10-CM | POA: Diagnosis not present

## 2019-12-17 DIAGNOSIS — R600 Localized edema: Secondary | ICD-10-CM | POA: Diagnosis not present

## 2019-12-17 NOTE — Patient Instructions (Signed)
Medication Instructions:  Your physician recommends that you continue on your current medications as directed. Please refer to the Current Medication list given to you today.  *If you need a refill on your cardiac medications before your next appointment, please call your pharmacy*   Lab Work: None If you have labs (blood work) drawn today and your tests are completely normal, you will receive your results only by: . MyChart Message (if you have MyChart) OR . A paper copy in the mail If you have any lab test that is abnormal or we need to change your treatment, we will call you to review the results.   Testing/Procedures: None   Follow-Up: At [facility], you and your health needs are our priority.  As part of our continuing mission to provide you with exceptional heart care, we have created designated Provider Care Teams.  These Care Teams include your primary Cardiologist (physician) and Advanced Practice Providers (APPs -  Physician Assistants and Nurse Practitioners) who all work together to provide you with the care you need, when you need it.  We recommend signing up for the patient portal called "MyChart".  Sign up information is provided on this After Visit Summary.  MyChart is used to connect with patients for Virtual Visits (Telemedicine).  Patients are able to view lab/test results, encounter notes, upcoming appointments, etc.  Non-urgent messages can be sent to your provider as well.   To learn more about what you can do with MyChart, go to [email or web address].    Your next appointment:   6-9 month(s)  The format for your next appointment:   In Person  Provider:   You may see Henry W Smith III, MD or one of the following Advanced Practice Providers on your designated Care Team:    Lori Gerhardt, NP  Laura Ingold, NP  Jill McDaniel, NP    Other Instructions   

## 2019-12-20 DIAGNOSIS — D631 Anemia in chronic kidney disease: Secondary | ICD-10-CM | POA: Diagnosis not present

## 2019-12-20 DIAGNOSIS — N186 End stage renal disease: Secondary | ICD-10-CM | POA: Diagnosis not present

## 2019-12-20 DIAGNOSIS — E8779 Other fluid overload: Secondary | ICD-10-CM | POA: Diagnosis not present

## 2019-12-20 DIAGNOSIS — N2581 Secondary hyperparathyroidism of renal origin: Secondary | ICD-10-CM | POA: Diagnosis not present

## 2019-12-20 DIAGNOSIS — Z992 Dependence on renal dialysis: Secondary | ICD-10-CM | POA: Diagnosis not present

## 2019-12-21 DIAGNOSIS — E785 Hyperlipidemia, unspecified: Secondary | ICD-10-CM | POA: Diagnosis not present

## 2019-12-21 DIAGNOSIS — E8779 Other fluid overload: Secondary | ICD-10-CM | POA: Diagnosis not present

## 2019-12-21 DIAGNOSIS — N2581 Secondary hyperparathyroidism of renal origin: Secondary | ICD-10-CM | POA: Diagnosis not present

## 2019-12-21 DIAGNOSIS — N186 End stage renal disease: Secondary | ICD-10-CM | POA: Diagnosis not present

## 2019-12-21 DIAGNOSIS — D631 Anemia in chronic kidney disease: Secondary | ICD-10-CM | POA: Diagnosis not present

## 2019-12-22 ENCOUNTER — Other Ambulatory Visit: Payer: Self-pay | Admitting: Internal Medicine

## 2019-12-24 DIAGNOSIS — G894 Chronic pain syndrome: Secondary | ICD-10-CM | POA: Diagnosis not present

## 2019-12-24 DIAGNOSIS — Z79891 Long term (current) use of opiate analgesic: Secondary | ICD-10-CM | POA: Diagnosis not present

## 2019-12-24 DIAGNOSIS — M79609 Pain in unspecified limb: Secondary | ICD-10-CM | POA: Diagnosis not present

## 2019-12-24 DIAGNOSIS — G629 Polyneuropathy, unspecified: Secondary | ICD-10-CM | POA: Diagnosis not present

## 2019-12-25 DIAGNOSIS — E8779 Other fluid overload: Secondary | ICD-10-CM | POA: Diagnosis not present

## 2019-12-25 DIAGNOSIS — E785 Hyperlipidemia, unspecified: Secondary | ICD-10-CM | POA: Diagnosis not present

## 2019-12-25 DIAGNOSIS — N186 End stage renal disease: Secondary | ICD-10-CM | POA: Diagnosis not present

## 2019-12-25 DIAGNOSIS — D631 Anemia in chronic kidney disease: Secondary | ICD-10-CM | POA: Diagnosis not present

## 2019-12-25 DIAGNOSIS — N2581 Secondary hyperparathyroidism of renal origin: Secondary | ICD-10-CM | POA: Diagnosis not present

## 2019-12-26 DIAGNOSIS — D631 Anemia in chronic kidney disease: Secondary | ICD-10-CM | POA: Diagnosis not present

## 2019-12-26 DIAGNOSIS — E785 Hyperlipidemia, unspecified: Secondary | ICD-10-CM | POA: Diagnosis not present

## 2019-12-26 DIAGNOSIS — E8779 Other fluid overload: Secondary | ICD-10-CM | POA: Diagnosis not present

## 2019-12-26 DIAGNOSIS — N2581 Secondary hyperparathyroidism of renal origin: Secondary | ICD-10-CM | POA: Diagnosis not present

## 2019-12-26 DIAGNOSIS — N186 End stage renal disease: Secondary | ICD-10-CM | POA: Diagnosis not present

## 2019-12-27 DIAGNOSIS — E119 Type 2 diabetes mellitus without complications: Secondary | ICD-10-CM | POA: Diagnosis not present

## 2019-12-27 DIAGNOSIS — E785 Hyperlipidemia, unspecified: Secondary | ICD-10-CM | POA: Diagnosis not present

## 2019-12-27 DIAGNOSIS — N186 End stage renal disease: Secondary | ICD-10-CM | POA: Diagnosis not present

## 2019-12-27 DIAGNOSIS — N2581 Secondary hyperparathyroidism of renal origin: Secondary | ICD-10-CM | POA: Diagnosis not present

## 2019-12-27 DIAGNOSIS — D631 Anemia in chronic kidney disease: Secondary | ICD-10-CM | POA: Diagnosis not present

## 2019-12-27 DIAGNOSIS — E8779 Other fluid overload: Secondary | ICD-10-CM | POA: Diagnosis not present

## 2019-12-29 DIAGNOSIS — N186 End stage renal disease: Secondary | ICD-10-CM | POA: Diagnosis not present

## 2019-12-29 DIAGNOSIS — N2581 Secondary hyperparathyroidism of renal origin: Secondary | ICD-10-CM | POA: Diagnosis not present

## 2019-12-29 DIAGNOSIS — E8779 Other fluid overload: Secondary | ICD-10-CM | POA: Diagnosis not present

## 2019-12-29 DIAGNOSIS — E785 Hyperlipidemia, unspecified: Secondary | ICD-10-CM | POA: Diagnosis not present

## 2019-12-29 DIAGNOSIS — D631 Anemia in chronic kidney disease: Secondary | ICD-10-CM | POA: Diagnosis not present

## 2020-01-01 DIAGNOSIS — D631 Anemia in chronic kidney disease: Secondary | ICD-10-CM | POA: Diagnosis not present

## 2020-01-01 DIAGNOSIS — E8779 Other fluid overload: Secondary | ICD-10-CM | POA: Diagnosis not present

## 2020-01-01 DIAGNOSIS — E785 Hyperlipidemia, unspecified: Secondary | ICD-10-CM | POA: Diagnosis not present

## 2020-01-01 DIAGNOSIS — N2581 Secondary hyperparathyroidism of renal origin: Secondary | ICD-10-CM | POA: Diagnosis not present

## 2020-01-01 DIAGNOSIS — N186 End stage renal disease: Secondary | ICD-10-CM | POA: Diagnosis not present

## 2020-01-03 DIAGNOSIS — D631 Anemia in chronic kidney disease: Secondary | ICD-10-CM | POA: Diagnosis not present

## 2020-01-03 DIAGNOSIS — N2581 Secondary hyperparathyroidism of renal origin: Secondary | ICD-10-CM | POA: Diagnosis not present

## 2020-01-03 DIAGNOSIS — E8779 Other fluid overload: Secondary | ICD-10-CM | POA: Diagnosis not present

## 2020-01-03 DIAGNOSIS — N186 End stage renal disease: Secondary | ICD-10-CM | POA: Diagnosis not present

## 2020-01-03 DIAGNOSIS — E785 Hyperlipidemia, unspecified: Secondary | ICD-10-CM | POA: Diagnosis not present

## 2020-01-05 DIAGNOSIS — E785 Hyperlipidemia, unspecified: Secondary | ICD-10-CM | POA: Diagnosis not present

## 2020-01-05 DIAGNOSIS — E8779 Other fluid overload: Secondary | ICD-10-CM | POA: Diagnosis not present

## 2020-01-05 DIAGNOSIS — N186 End stage renal disease: Secondary | ICD-10-CM | POA: Diagnosis not present

## 2020-01-05 DIAGNOSIS — D631 Anemia in chronic kidney disease: Secondary | ICD-10-CM | POA: Diagnosis not present

## 2020-01-05 DIAGNOSIS — N2581 Secondary hyperparathyroidism of renal origin: Secondary | ICD-10-CM | POA: Diagnosis not present

## 2020-01-08 DIAGNOSIS — D631 Anemia in chronic kidney disease: Secondary | ICD-10-CM | POA: Diagnosis not present

## 2020-01-08 DIAGNOSIS — N186 End stage renal disease: Secondary | ICD-10-CM | POA: Diagnosis not present

## 2020-01-08 DIAGNOSIS — E785 Hyperlipidemia, unspecified: Secondary | ICD-10-CM | POA: Diagnosis not present

## 2020-01-08 DIAGNOSIS — E8779 Other fluid overload: Secondary | ICD-10-CM | POA: Diagnosis not present

## 2020-01-08 DIAGNOSIS — N2581 Secondary hyperparathyroidism of renal origin: Secondary | ICD-10-CM | POA: Diagnosis not present

## 2020-01-10 ENCOUNTER — Encounter (HOSPITAL_COMMUNITY): Payer: Self-pay

## 2020-01-10 ENCOUNTER — Emergency Department (HOSPITAL_COMMUNITY)
Admission: EM | Admit: 2020-01-10 | Discharge: 2020-01-10 | Disposition: A | Discharge status: Home / Self Care | Payer: Medicare Other | Attending: Emergency Medicine | Admitting: Emergency Medicine

## 2020-01-10 ENCOUNTER — Other Ambulatory Visit: Payer: Self-pay

## 2020-01-10 DIAGNOSIS — I110 Hypertensive heart disease with heart failure: Secondary | ICD-10-CM | POA: Diagnosis not present

## 2020-01-10 DIAGNOSIS — Z794 Long term (current) use of insulin: Secondary | ICD-10-CM | POA: Diagnosis not present

## 2020-01-10 DIAGNOSIS — R000 Tachycardia, unspecified: Secondary | ICD-10-CM | POA: Diagnosis not present

## 2020-01-10 DIAGNOSIS — N2581 Secondary hyperparathyroidism of renal origin: Secondary | ICD-10-CM | POA: Diagnosis not present

## 2020-01-10 DIAGNOSIS — E1122 Type 2 diabetes mellitus with diabetic chronic kidney disease: Secondary | ICD-10-CM | POA: Diagnosis not present

## 2020-01-10 DIAGNOSIS — E875 Hyperkalemia: Secondary | ICD-10-CM | POA: Diagnosis not present

## 2020-01-10 DIAGNOSIS — R0602 Shortness of breath: Secondary | ICD-10-CM | POA: Diagnosis present

## 2020-01-10 DIAGNOSIS — D631 Anemia in chronic kidney disease: Secondary | ICD-10-CM | POA: Diagnosis not present

## 2020-01-10 DIAGNOSIS — N186 End stage renal disease: Secondary | ICD-10-CM | POA: Diagnosis not present

## 2020-01-10 DIAGNOSIS — G4700 Insomnia, unspecified: Secondary | ICD-10-CM | POA: Diagnosis not present

## 2020-01-10 DIAGNOSIS — E8779 Other fluid overload: Secondary | ICD-10-CM | POA: Diagnosis not present

## 2020-01-10 DIAGNOSIS — Z992 Dependence on renal dialysis: Secondary | ICD-10-CM | POA: Diagnosis not present

## 2020-01-10 DIAGNOSIS — F419 Anxiety disorder, unspecified: Secondary | ICD-10-CM | POA: Diagnosis not present

## 2020-01-10 DIAGNOSIS — E785 Hyperlipidemia, unspecified: Secondary | ICD-10-CM | POA: Diagnosis not present

## 2020-01-10 DIAGNOSIS — I5042 Chronic combined systolic (congestive) and diastolic (congestive) heart failure: Secondary | ICD-10-CM | POA: Diagnosis not present

## 2020-01-10 DIAGNOSIS — Z7982 Long term (current) use of aspirin: Secondary | ICD-10-CM | POA: Diagnosis not present

## 2020-01-10 LAB — CBC WITH DIFFERENTIAL/PLATELET
Abs Immature Granulocytes: 0.01 10*3/uL (ref 0.00–0.07)
Basophils Absolute: 0.1 10*3/uL (ref 0.0–0.1)
Basophils Relative: 1 %
Eosinophils Absolute: 0.2 10*3/uL (ref 0.0–0.5)
Eosinophils Relative: 3 %
HCT: 43.5 % (ref 39.0–52.0)
Hemoglobin: 13.1 g/dL (ref 13.0–17.0)
Immature Granulocytes: 0 %
Lymphocytes Relative: 22 %
Lymphs Abs: 1.5 10*3/uL (ref 0.7–4.0)
MCH: 31.3 pg (ref 26.0–34.0)
MCHC: 30.1 g/dL (ref 30.0–36.0)
MCV: 104.1 fL — ABNORMAL HIGH (ref 80.0–100.0)
Monocytes Absolute: 0.9 10*3/uL (ref 0.1–1.0)
Monocytes Relative: 14 %
Neutro Abs: 4.1 10*3/uL (ref 1.7–7.7)
Neutrophils Relative %: 60 %
Platelets: 244 10*3/uL (ref 150–400)
RBC: 4.18 MIL/uL — ABNORMAL LOW (ref 4.22–5.81)
RDW: 14.8 % (ref 11.5–15.5)
WBC: 6.8 10*3/uL (ref 4.0–10.5)
nRBC: 0 % (ref 0.0–0.2)

## 2020-01-10 LAB — COMPREHENSIVE METABOLIC PANEL
ALT: 11 U/L (ref 0–44)
AST: 14 U/L — ABNORMAL LOW (ref 15–41)
Albumin: 3.7 g/dL (ref 3.5–5.0)
Alkaline Phosphatase: 98 U/L (ref 38–126)
Anion gap: 17 — ABNORMAL HIGH (ref 5–15)
BUN: 44 mg/dL — ABNORMAL HIGH (ref 6–20)
CO2: 20 mmol/L — ABNORMAL LOW (ref 22–32)
Calcium: 8.8 mg/dL — ABNORMAL LOW (ref 8.9–10.3)
Chloride: 99 mmol/L (ref 98–111)
Creatinine, Ser: 12.32 mg/dL — ABNORMAL HIGH (ref 0.61–1.24)
GFR, Estimated: 4 mL/min — ABNORMAL LOW (ref >60)
Glucose, Bld: 125 mg/dL — ABNORMAL HIGH (ref 70–99)
Potassium: 6.5 mmol/L (ref 3.5–5.1)
Sodium: 136 mmol/L (ref 135–145)
Total Bilirubin: 1 mg/dL (ref 0.3–1.2)
Total Protein: 8.1 g/dL (ref 6.5–8.1)

## 2020-01-10 LAB — CBG MONITORING, ED: Glucose-Capillary: 114 mg/dL — ABNORMAL HIGH (ref 70–99)

## 2020-01-10 MED ORDER — SODIUM ZIRCONIUM CYCLOSILICATE 10 G PO PACK
10.0000 g | PACK | Freq: Once | ORAL | Status: AC
  Administered 2020-01-10: 10 g via ORAL
  Filled 2020-01-10: qty 1

## 2020-01-10 NOTE — ED Notes (Signed)
Pt refusing lokelma.  Pt was made aware  Of risks and MD talked to him regarding this. Pt aware of them all and will not take it.  Will follow up with dialysis MD tomorrow.,=

## 2020-01-10 NOTE — Discharge Instructions (Signed)
I discussed your potassium level with the nephrologist that was on call.  Dr. Moshe Cipro.  She will try and contact her dialysis center to have them recheck her potassium prior to dialysis today can determine the proper amount of potassium to be used during your dialysis.  She did recommend that I give you a dose of Lokelma here.

## 2020-01-10 NOTE — ED Triage Notes (Signed)
Patient presents with being unable to sleep, he says he gets 1-2 hours of sleep and wakes right back up. He is on dialysis, has amputated fingers, knees that are giving him a hard time, swollen lymph nodes on his groin and inner thighs. He is trying to figure out if this is true anxiety. 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 Lately he has noticed the trouble sleeping and he feels like he is not breathing normally.

## 2020-01-10 NOTE — ED Provider Notes (Signed)
[facility] DEPT Provider Note   CSN: [id number] Arrival date & time: 01/10/20  1233     History Chief Complaint  Patient presents with  . Anxiety  . Insomnia    RANSON BELLUOMINI is a 54 y.o. male.  54 yo M with a cc of insomnia.  Patient waking up and having trouble going back to sleep. Thought to be due to being too hot or becoming hypoglycemic. Has seen his endocrinologist recently and adjusted his blood sugar regimen so now is no longer becoming hypoglycemic. He does wake up to his phone ringing or if he wakes up too hot. He denies trouble breathing that wakes him up. Has had some trouble breathing at baseline. Has had some left upper quadrant abdominal pain off and on that usually occurs with eating. Denies exertional symptoms. He is concerned that he is having panic attacks which is keeping him awake or that he is perseverating about his medical problems.  The history is provided by the patient.  Anxiety This is a new problem. The current episode started yesterday. The problem occurs constantly. The problem has not changed since onset.Associated symptoms include abdominal pain and shortness of breath. Pertinent negatives include no chest pain and no headaches. Nothing aggravates the symptoms. Nothing relieves the symptoms. He has tried nothing for the symptoms. The treatment provided no relief.  Insomnia Associated symptoms include abdominal pain and shortness of breath. Pertinent negatives include no chest pain and no headaches.       Past Medical History:  Diagnosis Date  . Anal infection    posterior anal canal  . Anemia, chronic renal failure   . Atrophic kidney    BILATERAL  . DM type 2 causing ESRD [facility])    Nephrologist-- dr [name] [facility])--  on hemodialysis since June 2012 at  [name]  TTS  . Hemodialysis patient [facility])    at [place] on Tues/ Thur/Sat/schedule  . Hemorrhoids   . Hepatitis B antibody  positive   . History of pleural effusion    bilateral  . Hyperparathyroidism, secondary renal ([place])   . Hypertension   . Ischemic cardiomyopathy    per echo 07-01-2014  ef 45%  . LAFB (left anterior fascicular block)   . Peripheral neuropathy   . Systolic and diastolic CHF, chronic ([place])    CARDIOLOGIST-  DR Daneen Schick ([place])  AND DR [name] ([name])    Patient Active Problem List   Diagnosis Date Noted  . Medication monitoring encounter 07/25/2019  . Penile mass 07/16/2019  . 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 Acute GI bleeding 10/13/2017  . ESRD on hemodialysis ([place]) 10/13/2017  . Embolism, arterial ([place]) 10/12/2017  . Diabetes mellitus type 2, uncontrolled ([place]) 05/03/2014  . Non-ischemic cardiomyopathy ([place]) 05/03/2014  . HYPERCHOLESTEROLEMIA 08/19/2009  . Essential hypertension 08/19/2009  . Chronic systolic heart failure ([place]) 08/19/2009  . CKD (chronic kidney disease) stage V requiring chronic dialysis ([place]) 08/19/2009  . Depression 08/19/2009    Past Surgical History:  Procedure Laterality Date  . APPENDECTOMY  09-12-2004   laparotomy w/ drainage peritinitis  . AV FISTULA PLACEMENT  02-27-2010   right forearm (RADIOCEPHALIC)  . AV FISTULA REPAIR  10-30-2010  . CARDIOVASCULAR STRESS TEST  10-29-2011   dr Daneen Schick   Low risk scan;  mild perfusion defect seen in the basal inferoseptal, basal inferior and mid inferior regions consistent with an infarct/scar  and/or overlying attenuation/  mild to moderate global LVSF,  ef 40-45%  . DOBUTAMINE STRESS ECHO  07-23-2012   [name]   abnormal ;  at rest estimated lvef 25-30% and global severe LV hypokinesis ;  no cp during stress and achieved 85% maxium predicted heart rate;  negative stress ECG for inducible ischemia;  estimated lvef with stress 35-40%;  augmentation of wall segments consistant with cardiomyopathy and differential fibrosis  . FISTULOTOMY N/A  03/26/2015   Procedure: FISTULOTOMY;  Surgeon: [name], MD;  Location: [facility];  Service: General;  Laterality: N/A;  . INCISION AND DRAINAGE ABSCESS N/A 03/26/2015   Procedure: ANAL INCISION AND DRAINAGE;  Surgeon: [name], MD;  Location: [facility];  Service: General;  Laterality: N/A;  . RETINAL DETACHMENT SURGERY Left 2011   incomplete repair/ needs eye drops to keep pressure down  . TEE WITHOUT CARDIOVERSION N/A 10/17/2017   Procedure: TRANSESOPHAGEAL ECHOCARDIOGRAM (TEE);  Surgeon: Josue Hector, MD;  Location: [facility] ENDOSCOPY;  Service: Cardiovascular;  Laterality: N/A;  . TRANSTHORACIC ECHOCARDIOGRAM  07-01-2014    done at [facility]   grade 1 diastolic dysfunction,  ef 45%/  trace TR and PR       Family History  Family history unknown: Yes    Social History   Tobacco Use  . Smoking status: Never Smoker  . Smokeless tobacco: Never Used  Vaping Use  . Vaping Use: Never used  Substance Use Topics  . Alcohol use: No  . Drug use: No    Home Medications Prior to Admission medications   Medication Sig Start Date End Date Taking? Authorizing Provider  acetaminophen (TYLENOL) 500 MG tablet Take 1,000 mg by mouth as needed.    [provider]  allopurinol (ZYLOPRIM) 100 MG tablet Take 100 mg by mouth daily. 05/28/19   [provider]  aspirin 325 MG tablet Take 325 mg by mouth daily.    [provider]  calcium acetate (PHOSLO) 667 MG capsule Take 2,001 mg by mouth 3 (three) times daily with meals.  06/02/10   [provider]  celecoxib (CELEBREX) 100 MG capsule Take 100 mg by mouth daily. 04/25/19   [provider]  cyanocobalamin 1000 MCG tablet Take by mouth. 06/02/10   [provider]  febuxostat (ULORIC) 40 MG tablet Take 40 mg by mouth daily.     [provider]  ferric citrate (AURYXIA) 1 GM 210 MG(Fe) tablet Take 630 mg by mouth 3 (three) times daily.      [provider]  gabapentin (NEURONTIN) 300 MG capsule Take 600 mg by mouth as needed. For pain 05/09/15   [provider]  HUMALOG 100 UNIT/ML injection 50U/DAY VIA PUMP SUBCUTANEOUSLY 07/26/19   [provider]  Insulin Disposable Pump (OMNIPOD DASH 5 PACK PODS) MISC Inject into the skin. 08/28/19   [provider]  lidocaine (XYLOCAINE) 5 % ointment APPLY TOPICALLY THREE TIMES DAILY FOR PAIN OF FINGERS 07/14/19   [provider]  midodrine (PROAMATINE) 5 MG tablet Take 5 mg by mouth 3 (three) times a week. Every Tuesday Thursday and Saturday    [provider]  Multiple Vitamins-Minerals (MEGA MULTIVITAMIN FOR MEN PO) Take 1 tablet by mouth daily.     [provider]  NON FORMULARY humalog omnipod three times daily adjusted to food intake    [provider]  ondansetron (ZOFRAN) 4 MG tablet Take 4 mg by mouth 3 (three) times daily as  needed. 06/27/19   [provider]  Oxycodone HCl 10 MG TABS Take 10 mg by mouth 3 (three) times daily. 09/28/19   [provider]  sucroferric oxyhydroxide (VELPHORO) 500 MG chewable tablet Chew 500 mg by mouth 2 (two) times daily.    [provider]  triamcinolone cream (KENALOG) 0.1 % APPLY TO AFFECTED AREAS OF TRUNK AND EXTREMITIES TWICE A DAY 07/14/19   [provider]    Allergies    Patient has no known allergies.  Review of Systems   Review of Systems  Constitutional: Negative for chills and fever.  HENT: Negative for congestion and facial swelling.   Eyes: Negative for discharge and visual disturbance.  Respiratory: Positive for shortness of breath.   Cardiovascular: Negative for chest pain and palpitations.  Gastrointestinal: Positive for abdominal pain. Negative for diarrhea and vomiting.  Musculoskeletal: Negative for arthralgias and myalgias.  Skin: Negative for color change and rash.  Neurological: Negative for tremors, syncope and headaches.    Psychiatric/Behavioral: Positive for sleep disturbance. Negative for confusion and dysphoric mood. The patient is nervous/anxious and has insomnia.     Physical Exam Updated Vital Signs BP 133/82 (BP Location: Left Arm)   Pulse (!) 101   Temp 98.8 F (37.1 C) (Oral)   Resp 17   Ht 5\' 5"  (1.651 m)   Wt 108.5 kg   SpO2 100%   BMI 39.80 kg/m   Physical Exam Vitals and nursing note reviewed.  Constitutional:      Appearance: He is well-developed. He is obese.  HENT:     Head: Normocephalic and atraumatic.  Eyes:     Pupils: Pupils are equal, round, and reactive to light.  Neck:     Vascular: No JVD.  Cardiovascular:     Rate and Rhythm: Normal rate and regular rhythm.     Heart sounds: No murmur heard.  No friction rub. No gallop.   Pulmonary:     Effort: No respiratory distress.     Breath sounds: No wheezing or rales.  Abdominal:     General: There is no distension.     Tenderness: There is no abdominal tenderness. There is no guarding or rebound.     Comments: Obese, otherwise benign abdominal exam  Musculoskeletal:        General: Normal range of motion.     Cervical back: Normal range of motion and neck supple.     Comments: Right AV fistula with palpable thrill  Skin:    Coloration: Skin is not pale.     Findings: No rash.  Neurological:     Mental Status: He is alert and oriented to person, place, and time.  Psychiatric:        Behavior: Behavior normal.     ED Results / Procedures / Treatments   Labs (all labs ordered are listed, but only abnormal results are displayed) Labs Reviewed  CBC WITH DIFFERENTIAL/PLATELET - Abnormal; Notable for the following components:      Result Value   RBC 4.18 (*)    MCV 104.1 (*)    All other components within normal limits  COMPREHENSIVE METABOLIC PANEL - Abnormal; Notable for the following components:   Potassium 6.5 (*)    CO2 20 (*)    Glucose, Bld 125 (*)    BUN 44 (*)    Creatinine, Ser 12.32 (*)     Calcium 8.8 (*)    AST 14 (*)    GFR, Estimated 4 (*)  Anion gap 17 (*)    All other components within normal limits  CBG MONITORING, ED - Abnormal; Notable for the following components:   Glucose-Capillary 114 (*)    All other components within normal limits    EKG EKG Interpretation  Date/Time:  Thursday January 10 2020 17:21:04 EDT Ventricular Rate:  107 PR Interval:    QRS Duration: 96 QT Interval:  355 QTC Calculation: 474 R Axis:   138 Text Interpretation: Sinus tachycardia Left posterior fascicular block No significant change since last tracing Confirmed by Deno Etienne [phone]) on 01/10/2020 5:38:00 PM   Radiology No results found.  Procedures Procedures (including critical care time)  Medications Ordered in ED Medications  sodium zirconium cyclosilicate (LOKELMA) packet 10 g (has no administration in time range)    ED Course  I have reviewed the triage vital signs and the nursing notes.  Pertinent labs & imaging results that were available during my care of the patient were reviewed by me and considered in my medical decision making (see chart for details).    MDM Rules/Calculators/A&P                          54 yo M with a chief complaint of difficulty sleeping. Patient states that he wakes up easily and then usually stays awake. Has no trouble going to sleep initially. He is concerned that he may be having panic attacks, is wondering if he has underlying anxiety that requires treatment. When I asked him to further define what exactly makes him wake up he told me he is either too hot or becomes hypoglycemic in the night. Denies orthopnea or PND. Unsure if he snores. Will obtain a laboratory evaluation here to assess for electrolyte abnormality. I feel that this is unremarkable that he can have his insomnia further worked up as an outpatient.  The patient's potassium is elevated 6.5 without visible hemolysis.  His EKG does have very subtle peaked T waves.  I  discussed this with the patient she was not surprised, he states typically after dialysis his potassium is slightly elevated and then it tends to drop down the next day.  He will plan to do a low potassium diet.  I cautioned him that I was worried about the level of his potassium and recommended discussing it with the nephrologist and likely giving him an oral agent to try and find potassium in his gut.  He told me he would prefer not to take anything else today, usually likes to clean his system out and then will start back at his next dialysis session.  I discussed with him that this high potassium level could cause him to suddenly die without warning.  He stated understanding.  He agreed to try and call his nephrologist tomorrow.  Plans to see his family doctor on Monday.  He will return for any worsening symptoms.  I did discuss the case with Dr. Moshe Cipro, nephrology.  She will try and contact his dialysis center to have them recheck his potassium level.  She did recommend trying to give him a dose of Lokelma prior to discharge.  8:30 PM:  I have discussed the diagnosis/risks/treatment options with the patient and believe the pt to be eligible for discharge home to follow-up with PCP, Nephrology. We also discussed returning to the ED immediately if new or worsening sx occur. We discussed the sx which are most concerning (e.g., sudden worsening pain, fever, inability to tolerate by  mouth, syncope) that necessitate immediate return. Medications administered to the patient during their visit and any new prescriptions provided to the patient are listed below.  Medications given during this visit Medications  sodium zirconium cyclosilicate (LOKELMA) packet 10 g (has no administration in time range)     The patient appears reasonably screen and/or stabilized for discharge and I doubt any other medical condition or other [facility] requiring further screening, evaluation, or treatment in the ED at this time  prior to discharge.     Final Clinical Impression(s) / ED Diagnoses Final diagnoses:  Insomnia, unspecified type  Hyperkalemia    Rx / DC Orders ED Discharge Orders    None       Deno Etienne, DO 01/10/20 2030

## 2020-01-12 DIAGNOSIS — N186 End stage renal disease: Secondary | ICD-10-CM | POA: Diagnosis not present

## 2020-01-12 DIAGNOSIS — N2581 Secondary hyperparathyroidism of renal origin: Secondary | ICD-10-CM | POA: Diagnosis not present

## 2020-01-12 DIAGNOSIS — D631 Anemia in chronic kidney disease: Secondary | ICD-10-CM | POA: Diagnosis not present

## 2020-01-12 DIAGNOSIS — E785 Hyperlipidemia, unspecified: Secondary | ICD-10-CM | POA: Diagnosis not present

## 2020-01-12 DIAGNOSIS — E8779 Other fluid overload: Secondary | ICD-10-CM | POA: Diagnosis not present

## 2020-01-14 DIAGNOSIS — I5030 Unspecified diastolic (congestive) heart failure: Secondary | ICD-10-CM | POA: Diagnosis not present

## 2020-01-14 DIAGNOSIS — E1142 Type 2 diabetes mellitus with diabetic polyneuropathy: Secondary | ICD-10-CM | POA: Diagnosis not present

## 2020-01-14 DIAGNOSIS — E7801 Familial hypercholesterolemia: Secondary | ICD-10-CM | POA: Diagnosis not present

## 2020-01-14 DIAGNOSIS — M899 Disorder of bone, unspecified: Secondary | ICD-10-CM | POA: Diagnosis not present

## 2020-01-14 DIAGNOSIS — Z79899 Other long term (current) drug therapy: Secondary | ICD-10-CM | POA: Diagnosis not present

## 2020-01-14 DIAGNOSIS — D638 Anemia in other chronic diseases classified elsewhere: Secondary | ICD-10-CM | POA: Diagnosis not present

## 2020-01-14 DIAGNOSIS — Z79891 Long term (current) use of opiate analgesic: Secondary | ICD-10-CM | POA: Diagnosis not present

## 2020-01-14 DIAGNOSIS — R599 Enlarged lymph nodes, unspecified: Secondary | ICD-10-CM | POA: Diagnosis not present

## 2020-01-14 DIAGNOSIS — N186 End stage renal disease: Secondary | ICD-10-CM | POA: Diagnosis not present

## 2020-01-14 DIAGNOSIS — M79641 Pain in right hand: Secondary | ICD-10-CM | POA: Diagnosis not present

## 2020-01-14 DIAGNOSIS — M25561 Pain in right knee: Secondary | ICD-10-CM | POA: Diagnosis not present

## 2020-01-14 DIAGNOSIS — E7800 Pure hypercholesterolemia, unspecified: Secondary | ICD-10-CM | POA: Diagnosis not present

## 2020-01-15 DIAGNOSIS — E785 Hyperlipidemia, unspecified: Secondary | ICD-10-CM | POA: Diagnosis not present

## 2020-01-15 DIAGNOSIS — N186 End stage renal disease: Secondary | ICD-10-CM | POA: Diagnosis not present

## 2020-01-15 DIAGNOSIS — N2581 Secondary hyperparathyroidism of renal origin: Secondary | ICD-10-CM | POA: Diagnosis not present

## 2020-01-15 DIAGNOSIS — D631 Anemia in chronic kidney disease: Secondary | ICD-10-CM | POA: Diagnosis not present

## 2020-01-15 DIAGNOSIS — E8779 Other fluid overload: Secondary | ICD-10-CM | POA: Diagnosis not present

## 2020-01-16 DIAGNOSIS — I998 Other disorder of circulatory system: Secondary | ICD-10-CM | POA: Diagnosis not present

## 2020-01-16 DIAGNOSIS — I96 Gangrene, not elsewhere classified: Secondary | ICD-10-CM | POA: Diagnosis not present

## 2020-01-19 DIAGNOSIS — E8779 Other fluid overload: Secondary | ICD-10-CM | POA: Diagnosis not present

## 2020-01-19 DIAGNOSIS — N2581 Secondary hyperparathyroidism of renal origin: Secondary | ICD-10-CM | POA: Diagnosis not present

## 2020-01-19 DIAGNOSIS — N186 End stage renal disease: Secondary | ICD-10-CM | POA: Diagnosis not present

## 2020-01-19 DIAGNOSIS — D631 Anemia in chronic kidney disease: Secondary | ICD-10-CM | POA: Diagnosis not present

## 2020-01-19 DIAGNOSIS — E785 Hyperlipidemia, unspecified: Secondary | ICD-10-CM | POA: Diagnosis not present

## 2020-01-20 DIAGNOSIS — N186 End stage renal disease: Secondary | ICD-10-CM | POA: Diagnosis not present

## 2020-01-20 DIAGNOSIS — Z992 Dependence on renal dialysis: Secondary | ICD-10-CM | POA: Diagnosis not present

## 2020-01-21 DIAGNOSIS — N2581 Secondary hyperparathyroidism of renal origin: Secondary | ICD-10-CM | POA: Diagnosis not present

## 2020-01-21 DIAGNOSIS — N186 End stage renal disease: Secondary | ICD-10-CM | POA: Diagnosis not present

## 2020-01-21 DIAGNOSIS — D631 Anemia in chronic kidney disease: Secondary | ICD-10-CM | POA: Diagnosis not present

## 2020-01-21 DIAGNOSIS — D509 Iron deficiency anemia, unspecified: Secondary | ICD-10-CM | POA: Diagnosis not present

## 2020-01-21 DIAGNOSIS — Z23 Encounter for immunization: Secondary | ICD-10-CM | POA: Diagnosis not present

## 2020-01-22 DIAGNOSIS — N186 End stage renal disease: Secondary | ICD-10-CM | POA: Diagnosis not present

## 2020-01-22 DIAGNOSIS — D509 Iron deficiency anemia, unspecified: Secondary | ICD-10-CM | POA: Diagnosis not present

## 2020-01-22 DIAGNOSIS — Z23 Encounter for immunization: Secondary | ICD-10-CM | POA: Diagnosis not present

## 2020-01-22 DIAGNOSIS — N2581 Secondary hyperparathyroidism of renal origin: Secondary | ICD-10-CM | POA: Diagnosis not present

## 2020-01-22 DIAGNOSIS — D631 Anemia in chronic kidney disease: Secondary | ICD-10-CM | POA: Diagnosis not present

## 2020-01-24 DIAGNOSIS — Z23 Encounter for immunization: Secondary | ICD-10-CM | POA: Diagnosis not present

## 2020-01-24 DIAGNOSIS — D509 Iron deficiency anemia, unspecified: Secondary | ICD-10-CM | POA: Diagnosis not present

## 2020-01-24 DIAGNOSIS — E119 Type 2 diabetes mellitus without complications: Secondary | ICD-10-CM | POA: Diagnosis not present

## 2020-01-24 DIAGNOSIS — N186 End stage renal disease: Secondary | ICD-10-CM | POA: Diagnosis not present

## 2020-01-24 DIAGNOSIS — N2581 Secondary hyperparathyroidism of renal origin: Secondary | ICD-10-CM | POA: Diagnosis not present

## 2020-01-24 DIAGNOSIS — D631 Anemia in chronic kidney disease: Secondary | ICD-10-CM | POA: Diagnosis not present

## 2020-01-25 DIAGNOSIS — G894 Chronic pain syndrome: Secondary | ICD-10-CM | POA: Diagnosis not present

## 2020-01-25 DIAGNOSIS — M79609 Pain in unspecified limb: Secondary | ICD-10-CM | POA: Diagnosis not present

## 2020-01-25 DIAGNOSIS — G629 Polyneuropathy, unspecified: Secondary | ICD-10-CM | POA: Diagnosis not present

## 2020-01-25 DIAGNOSIS — I7301 Raynaud's syndrome with gangrene: Secondary | ICD-10-CM | POA: Diagnosis not present

## 2020-01-26 DIAGNOSIS — D631 Anemia in chronic kidney disease: Secondary | ICD-10-CM | POA: Diagnosis not present

## 2020-01-26 DIAGNOSIS — N186 End stage renal disease: Secondary | ICD-10-CM | POA: Diagnosis not present

## 2020-01-26 DIAGNOSIS — D509 Iron deficiency anemia, unspecified: Secondary | ICD-10-CM | POA: Diagnosis not present

## 2020-01-26 DIAGNOSIS — N2581 Secondary hyperparathyroidism of renal origin: Secondary | ICD-10-CM | POA: Diagnosis not present

## 2020-01-26 DIAGNOSIS — Z23 Encounter for immunization: Secondary | ICD-10-CM | POA: Diagnosis not present

## 2020-01-29 DIAGNOSIS — N186 End stage renal disease: Secondary | ICD-10-CM | POA: Diagnosis not present

## 2020-01-29 DIAGNOSIS — D509 Iron deficiency anemia, unspecified: Secondary | ICD-10-CM | POA: Diagnosis not present

## 2020-01-29 DIAGNOSIS — D631 Anemia in chronic kidney disease: Secondary | ICD-10-CM | POA: Diagnosis not present

## 2020-01-29 DIAGNOSIS — Z23 Encounter for immunization: Secondary | ICD-10-CM | POA: Diagnosis not present

## 2020-01-29 DIAGNOSIS — N2581 Secondary hyperparathyroidism of renal origin: Secondary | ICD-10-CM | POA: Diagnosis not present

## 2020-01-31 DIAGNOSIS — N2581 Secondary hyperparathyroidism of renal origin: Secondary | ICD-10-CM | POA: Diagnosis not present

## 2020-01-31 DIAGNOSIS — D509 Iron deficiency anemia, unspecified: Secondary | ICD-10-CM | POA: Diagnosis not present

## 2020-01-31 DIAGNOSIS — Z23 Encounter for immunization: Secondary | ICD-10-CM | POA: Diagnosis not present

## 2020-01-31 DIAGNOSIS — N186 End stage renal disease: Secondary | ICD-10-CM | POA: Diagnosis not present

## 2020-01-31 DIAGNOSIS — D631 Anemia in chronic kidney disease: Secondary | ICD-10-CM | POA: Diagnosis not present

## 2020-02-02 DIAGNOSIS — N186 End stage renal disease: Secondary | ICD-10-CM | POA: Diagnosis not present

## 2020-02-02 DIAGNOSIS — D631 Anemia in chronic kidney disease: Secondary | ICD-10-CM | POA: Diagnosis not present

## 2020-02-02 DIAGNOSIS — N2581 Secondary hyperparathyroidism of renal origin: Secondary | ICD-10-CM | POA: Diagnosis not present

## 2020-02-02 DIAGNOSIS — Z23 Encounter for immunization: Secondary | ICD-10-CM | POA: Diagnosis not present

## 2020-02-02 DIAGNOSIS — D509 Iron deficiency anemia, unspecified: Secondary | ICD-10-CM | POA: Diagnosis not present

## 2020-02-04 DIAGNOSIS — L988 Other specified disorders of the skin and subcutaneous tissue: Secondary | ICD-10-CM | POA: Diagnosis not present

## 2020-02-05 DIAGNOSIS — L98499 Non-pressure chronic ulcer of skin of other sites with unspecified severity: Secondary | ICD-10-CM | POA: Diagnosis not present

## 2020-02-05 DIAGNOSIS — I998 Other disorder of circulatory system: Secondary | ICD-10-CM | POA: Diagnosis not present

## 2020-02-05 DIAGNOSIS — N2581 Secondary hyperparathyroidism of renal origin: Secondary | ICD-10-CM | POA: Diagnosis not present

## 2020-02-05 DIAGNOSIS — I70208 Unspecified atherosclerosis of native arteries of extremities, other extremity: Secondary | ICD-10-CM | POA: Diagnosis not present

## 2020-02-05 DIAGNOSIS — N186 End stage renal disease: Secondary | ICD-10-CM | POA: Diagnosis not present

## 2020-02-05 DIAGNOSIS — D509 Iron deficiency anemia, unspecified: Secondary | ICD-10-CM | POA: Diagnosis not present

## 2020-02-05 DIAGNOSIS — D631 Anemia in chronic kidney disease: Secondary | ICD-10-CM | POA: Diagnosis not present

## 2020-02-05 DIAGNOSIS — Z23 Encounter for immunization: Secondary | ICD-10-CM | POA: Diagnosis not present

## 2020-02-07 DIAGNOSIS — N2581 Secondary hyperparathyroidism of renal origin: Secondary | ICD-10-CM | POA: Diagnosis not present

## 2020-02-07 DIAGNOSIS — D631 Anemia in chronic kidney disease: Secondary | ICD-10-CM | POA: Diagnosis not present

## 2020-02-07 DIAGNOSIS — D509 Iron deficiency anemia, unspecified: Secondary | ICD-10-CM | POA: Diagnosis not present

## 2020-02-07 DIAGNOSIS — N186 End stage renal disease: Secondary | ICD-10-CM | POA: Diagnosis not present

## 2020-02-07 DIAGNOSIS — Z23 Encounter for immunization: Secondary | ICD-10-CM | POA: Diagnosis not present

## 2020-02-08 ENCOUNTER — Encounter: Payer: Self-pay | Admitting: Podiatry

## 2020-02-08 ENCOUNTER — Ambulatory Visit (INDEPENDENT_AMBULATORY_CARE_PROVIDER_SITE_OTHER): Payer: Medicare Other | Admitting: Podiatry

## 2020-02-08 ENCOUNTER — Ambulatory Visit (INDEPENDENT_AMBULATORY_CARE_PROVIDER_SITE_OTHER): Payer: Medicare Other

## 2020-02-08 ENCOUNTER — Other Ambulatory Visit: Payer: Self-pay

## 2020-02-08 ENCOUNTER — Ambulatory Visit: Payer: Medicare Other

## 2020-02-08 DIAGNOSIS — W458XXA Other foreign body or object entering through skin, initial encounter: Secondary | ICD-10-CM

## 2020-02-08 DIAGNOSIS — E119 Type 2 diabetes mellitus without complications: Secondary | ICD-10-CM

## 2020-02-08 DIAGNOSIS — N186 End stage renal disease: Secondary | ICD-10-CM | POA: Diagnosis not present

## 2020-02-08 DIAGNOSIS — L02619 Cutaneous abscess of unspecified foot: Secondary | ICD-10-CM | POA: Diagnosis not present

## 2020-02-08 DIAGNOSIS — Z992 Dependence on renal dialysis: Secondary | ICD-10-CM

## 2020-02-08 DIAGNOSIS — B351 Tinea unguium: Secondary | ICD-10-CM | POA: Diagnosis not present

## 2020-02-08 DIAGNOSIS — M79674 Pain in right toe(s): Secondary | ICD-10-CM | POA: Diagnosis not present

## 2020-02-08 DIAGNOSIS — L03116 Cellulitis of left lower limb: Secondary | ICD-10-CM | POA: Diagnosis not present

## 2020-02-08 DIAGNOSIS — H3320 Serous retinal detachment, unspecified eye: Secondary | ICD-10-CM | POA: Insufficient documentation

## 2020-02-08 DIAGNOSIS — E1151 Type 2 diabetes mellitus with diabetic peripheral angiopathy without gangrene: Secondary | ICD-10-CM | POA: Diagnosis not present

## 2020-02-08 DIAGNOSIS — L03119 Cellulitis of unspecified part of limb: Secondary | ICD-10-CM

## 2020-02-08 DIAGNOSIS — N2581 Secondary hyperparathyroidism of renal origin: Secondary | ICD-10-CM | POA: Insufficient documentation

## 2020-02-08 DIAGNOSIS — M79675 Pain in left toe(s): Secondary | ICD-10-CM

## 2020-02-08 DIAGNOSIS — D649 Anemia, unspecified: Secondary | ICD-10-CM | POA: Insufficient documentation

## 2020-02-08 DIAGNOSIS — L02611 Cutaneous abscess of right foot: Secondary | ICD-10-CM

## 2020-02-08 DIAGNOSIS — E785 Hyperlipidemia, unspecified: Secondary | ICD-10-CM | POA: Insufficient documentation

## 2020-02-08 MED ORDER — DOXYCYCLINE HYCLATE 100 MG PO CAPS: 100.0000 mg | ORAL_CAPSULE | Freq: Two times a day (BID) | ORAL | 0 refills | Status: DC

## 2020-02-08 MED ORDER — MUPIROCIN 2 % EX OINT: TOPICAL_OINTMENT | CUTANEOUS | 1 refills | Status: DC

## 2020-02-08 NOTE — Patient Instructions (Addendum)
DRESSING CHANGES R hallux and left foot:   PHARMACY SHOPPING LIST: 1. Saline or Wound Cleanser for cleaning wound 2. 2 x 2 inch sterile gauze for cleaning wound  A. IF PRESCRIBED ORAL ANTIBIOTICS, TAKE ALL MEDICATION AS PRESCRIBED UNTIL ALL ARE GONE.  1. KEEP left foot DRY AT ALL TIMES!!!!  2. CLEANSE ULCER WITH SALINE OR WOUND CLEANSER.  3. DAB DRY WITH GAUZE SPONGE.  4. APPLY A LIGHT AMOUNT OF Mupirocin Ointment TO LEFT HEEL AND RIGHT GREAT TOE ONCE DAILY.  5. APPLY OUTER DRESSING AS INSTRUCTED.  6. DO NOT WALK BAREFOOT!!! IF YOU EXPERIENCE ANY FEVER, CHILLS, NIGHTSWEATS, NAUSEA OR VOMITING, ELEVATED OR LOW BLOOD SUGARS, REPORT TO EMERGENCY ROOM.   7IF YOU EXPERIENCE INCREASED REDNESS, PAIN, SWELLING, DISCOLORATION, ODOR, PUS, DRAINAGE OR WARMTH OF YOUR FOOT, REPORT TO EMERGENCY ROOM.

## 2020-02-09 DIAGNOSIS — N186 End stage renal disease: Secondary | ICD-10-CM | POA: Diagnosis not present

## 2020-02-09 DIAGNOSIS — N2581 Secondary hyperparathyroidism of renal origin: Secondary | ICD-10-CM | POA: Diagnosis not present

## 2020-02-09 DIAGNOSIS — D509 Iron deficiency anemia, unspecified: Secondary | ICD-10-CM | POA: Diagnosis not present

## 2020-02-09 DIAGNOSIS — Z23 Encounter for immunization: Secondary | ICD-10-CM | POA: Diagnosis not present

## 2020-02-09 DIAGNOSIS — D631 Anemia in chronic kidney disease: Secondary | ICD-10-CM | POA: Diagnosis not present

## 2020-02-10 NOTE — Progress Notes (Signed)
ANNUAL DIABETIC FOOT EXAM  Subjective: [name] [name] presents today for at risk foot care with h/o Raynaud's phenomenon, NIDDM with ESRD on hemodialysis and painful thick toenails that are difficult to trim. Pain interferes with ambulation. Aggravating factors include wearing enclosed shoe gear. Pain is relieved with periodic professional debridement.  Patient states he got a pedicure about one month ago. He remembers experiencing heel pain when his left heel was scraped. He also remembers something happening with his right great toenail during the pedicure.  Patient relates 15 year h/o diabetes.  Patient denies any h/o foot wounds.  Patient relates symptoms of foot numbness.  Patient relates symptoms of foot tingling.  Patient relates symptoms of burning in feet.  Patient's blood sugar was 189 mg/dl this morning.   [name], MD is patient's PCP.  He is on hemodialysis on TTS.   Past Medical History:  Diagnosis Date  . Anal infection    posterior anal canal  . Anemia, chronic renal failure   . Atrophic kidney    BILATERAL  . DM type 2 causing ESRD [facility])    Nephrologist-- dr [name] [facility])--  on hemodialysis since June 2012 at  [name]  TTS  . Hemodialysis patient [facility])    at [place] on Tues/ Thur/Sat/schedule  . Hemorrhoids   . Hepatitis B antibody positive   . History of pleural effusion    bilateral  . Hyperparathyroidism, secondary renal ([place])   . Hypertension   . Ischemic cardiomyopathy    per echo 07-01-2014  ef 45%  . LAFB (left anterior fascicular block)   . Peripheral neuropathy   . Systolic and diastolic CHF, chronic ([place])    CARDIOLOGIST-  DR Daneen Schick ([employer])  AND DR [name] ([name])   Patient Active Problem List   Diagnosis Date Noted  . HLD (hyperlipidemia) 02/08/2020  . Secondary hyperparathyroidism ([place]) 02/08/2020  . Secondary anemia 02/08/2020  . Retinal detachment  02/08/2020  . Medication monitoring encounter 07/25/2019  . Penile mass 07/16/2019  . BMI 40.0-44.9, adult ([place]) 12/20/2018  . Wound infection 09/15/2018  . 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 Acute GI bleeding 10/13/2017  . ESRD on hemodialysis ([place]) 10/13/2017  . Embolism, arterial ([place]) 10/12/2017  . Ischemia of finger 10-08-2017  . Deceased-donor kidney transplant 06/04/2016  . 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 Diabetes mellitus type 2, uncontrolled ([place]) 05/03/2014  . Non-ischemic cardiomyopathy ([place]) 05/03/2014  . Type II diabetes mellitus with renal manifestations ([place]) 02/01/2011  . Mechanical complication of other vascular device, implant, and graft 02/01/2011  . HYPERCHOLESTEROLEMIA 08/19/2009  . Essential hypertension 08/19/2009  . Chronic systolic heart failure ([place]) 08/19/2009  . CKD (chronic kidney disease) stage V requiring chronic dialysis ([place]) 08/19/2009  . Depression 08/19/2009   Past Surgical History:  Procedure Laterality Date  . APPENDECTOMY  09-12-2004   laparotomy w/ drainage peritinitis  . AV FISTULA PLACEMENT  02-27-2010   right forearm (RADIOCEPHALIC)  . AV FISTULA REPAIR  10-30-2010  . CARDIOVASCULAR STRESS TEST  10-29-2011   dr Daneen Schick   Low risk scan;  mild perfusion defect seen in the basal inferoseptal, basal inferior and mid inferior regions consistent with an infarct/scar and/or overlying attenuation/  mild to moderate global LVSF,  ef 40-45%  . DOBUTAMINE STRESS ECHO  07-23-2012   [name]   abnormal ;  at rest estimated lvef 25-30% and global severe  LV hypokinesis ;  no cp during stress and achieved 85% maxium predicted heart rate;  negative stress ECG for inducible ischemia;  estimated lvef with stress 35-40%;  augmentation of wall segments consistant with cardiomyopathy and differential fibrosis  . FISTULOTOMY N/A 03/26/2015   Procedure: FISTULOTOMY;  Surgeon: [name], MD;   Location: [facility];  Service: General;  Laterality: N/A;  . INCISION AND DRAINAGE ABSCESS N/A 03/26/2015   Procedure: ANAL INCISION AND DRAINAGE;  Surgeon: [name], MD;  Location: [facility];  Service: General;  Laterality: N/A;  . RETINAL DETACHMENT SURGERY Left 2011   incomplete repair/ needs eye drops to keep pressure down  . TEE WITHOUT CARDIOVERSION N/A 10/17/2017   Procedure: TRANSESOPHAGEAL ECHOCARDIOGRAM (TEE);  Surgeon: Josue Hector, MD;  Location: [name] ENDOSCOPY;  Service: Cardiovascular;  Laterality: N/A;  . TRANSTHORACIC ECHOCARDIOGRAM  07-01-2014    done at [facility]   grade 1 diastolic dysfunction,  ef 45%/  trace TR and PR   Current Outpatient Medications on File Prior to Visit  Medication Sig Dispense Refill  . acetaminophen (TYLENOL) 500 MG tablet Take 1,000 mg by mouth as needed.    [name] allopurinol (ZYLOPRIM) 100 MG tablet Take 100 mg by mouth daily.    [name] aspirin 325 MG tablet Take 325 mg by mouth daily.    . calcium acetate (PHOSLO) 667 MG capsule Take 2,001 mg by mouth 3 (three) times daily with meals.     . celecoxib (CELEBREX) 100 MG capsule Take 100 mg by mouth daily.    . cyanocobalamin 1000 MCG tablet Take by mouth.    . febuxostat (ULORIC) 40 MG tablet Take 40 mg by mouth daily.     . ferric citrate (AURYXIA) 1 GM 210 MG(Fe) tablet Take 630 mg by mouth 3 (three) times daily.     [name] gabapentin (NEURONTIN) 300 MG capsule Take 600 mg by mouth as needed. For pain  3  . HUMALOG 100 UNIT/ML injection 50U/DAY VIA PUMP SUBCUTANEOUSLY    . Insulin Disposable Pump (OMNIPOD DASH 5 PACK PODS) MISC Inject into the skin.    [name] lidocaine (XYLOCAINE) 5 % ointment APPLY TOPICALLY THREE TIMES DAILY FOR PAIN OF FINGERS    . midodrine (PROAMATINE) 5 MG tablet Take 5 mg by mouth 3 (three) times a week. Every Tuesday Thursday and Saturday    . Multiple Vitamins-Minerals (MEGA MULTIVITAMIN FOR MEN PO) Take 1 tablet by mouth daily.     . NON  FORMULARY humalog omnipod three times daily adjusted to food intake    . ondansetron (ZOFRAN) 4 MG tablet Take 4 mg by mouth 3 (three) times daily as needed.    . Oxycodone HCl 10 MG TABS Take 10 mg by mouth 3 (three) times daily.    . sucroferric oxyhydroxide (VELPHORO) 500 MG chewable tablet Chew 500 mg by mouth 2 (two) times daily.    [name] triamcinolone cream (KENALOG) 0.1 % APPLY TO AFFECTED AREAS OF TRUNK AND EXTREMITIES TWICE A DAY     No current facility-administered medications on file prior to visit.    No Known Allergies Social History   Occupational History  . Not on file  Tobacco Use  . Smoking status: Never Smoker  . Smokeless tobacco: Never Used  Vaping Use  . Vaping Use: Never used  Substance and Sexual Activity  . Alcohol use: No  . Drug use: No  . Sexual activity: Not on file   Family History  Family  history unknown: Yes    There is no immunization history on file for this patient.   Review of Systems: Negative except as noted in the HPI.  Objective: There were no vitals filed for this visit.  BRIGHTON PILLEY is a pleasant 54 y.o. male in NAD. AAO X 3.  Vascular Examination: Capillary refill time to digits <4 seconds b/l lower extremities. Faintly palpable pedal pulses b/l. Pedal hair absent. Lower extremity skin temperature gradient within normal limits. No ischemia or gangrene noted b/l lower extremities.  Dermatological Examination: Pedal skin with normal turgor, texture and tone bilaterally. No open wounds bilaterally. No interdigital macerations bilaterally. Toenails 2-5 bilaterally and L hallux elongated, discolored, dystrophic, thickened, and crumbly with subungual debris and tenderness to dorsal palpation.   There is noted onchyolysis of the nailplate of the right hallux.  There is maceration of the nailbed with odor and superficial nailbed ulceration. There is no erythema, no edema, no drainage, no underlying fluctuance.   Left heel with erythema and  tenderness to palpation. No fluctuance, No drainage.       Left foot with healing scab noted dorsal 1st MPJ. Stable. No surrounding erythema, no edema, no drainage, no fluctuance.   Scab noted plantar aspect of right foot submet head 5. No erythema, no edema, no fluctuance.  Musculoskeletal Examination: Normal muscle strength 5/5 to all lower extremity muscle groups bilaterally. No pain crepitus or joint limitation noted with ROM b/l. No gross bony deformities bilaterally.  Footwear Assessment: Does the patient wear appropriate shoes? Yes. Does the patient need inserts/orthotics? Yes..  Neurological Examination: Protective sensation intact 5/5 intact bilaterally with 10g monofilament b/l.   Xray findings bilateral feet: No gas in tissues left foot and right foot. No foreign body evident right foot.  Assessment: 1. Pain due to onychomycosis of toenails of both feet   2. Cellulitis and abscess of foot   3. Other foreign body or object entering through skin, initial encounter   4. Type II diabetes mellitus with peripheral circulatory disorder ([name])   5. ESRD on hemodialysis ([place])   6. Encounter for diabetic foot exam ([place])     ADA Risk Categorization: High Risk  Patient has one or more of the following: Loss of protective sensation Absent pedal pulses Severe Foot deformity History of foot ulcer  Plan: -Examined patient.  -Diabetic foot exam performed today. -Advised him to cease from getting pedicures. -Rx for doxycycline 100 mg po bid x 7 days. -Rx for Mupirocin Ointment to be applied to left heel and right hallux nailbed. -Patient to continue soft, supportive shoe gear daily. -Toenails 1-5 b/l were debrided in length and girth with sterile nail nippers and dremel without iatrogenic bleeding.  -Patient to report any pedal injuries to medical professional immediately. -Patient/POA to call should there be question/concern in the interim.  Return in about 5 days (around  02/13/2020), or Follow up next Wednesday with Dr. Sherryle Lis or Dr. Posey Pronto for check of both feet.  Marzetta Board, DPM

## 2020-02-11 ENCOUNTER — Encounter: Payer: Self-pay | Admitting: Podiatry

## 2020-02-11 ENCOUNTER — Ambulatory Visit (INDEPENDENT_AMBULATORY_CARE_PROVIDER_SITE_OTHER): Payer: Medicare Other | Admitting: Podiatry

## 2020-02-11 DIAGNOSIS — E1151 Type 2 diabetes mellitus with diabetic peripheral angiopathy without gangrene: Secondary | ICD-10-CM | POA: Diagnosis not present

## 2020-02-11 DIAGNOSIS — D631 Anemia in chronic kidney disease: Secondary | ICD-10-CM | POA: Diagnosis not present

## 2020-02-11 DIAGNOSIS — D509 Iron deficiency anemia, unspecified: Secondary | ICD-10-CM | POA: Diagnosis not present

## 2020-02-11 DIAGNOSIS — L03119 Cellulitis of unspecified part of limb: Secondary | ICD-10-CM | POA: Diagnosis not present

## 2020-02-11 DIAGNOSIS — N186 End stage renal disease: Secondary | ICD-10-CM | POA: Diagnosis not present

## 2020-02-11 DIAGNOSIS — B353 Tinea pedis: Secondary | ICD-10-CM | POA: Diagnosis not present

## 2020-02-11 DIAGNOSIS — L02619 Cutaneous abscess of unspecified foot: Secondary | ICD-10-CM | POA: Diagnosis not present

## 2020-02-11 DIAGNOSIS — N2581 Secondary hyperparathyroidism of renal origin: Secondary | ICD-10-CM | POA: Diagnosis not present

## 2020-02-11 DIAGNOSIS — W458XXA Other foreign body or object entering through skin, initial encounter: Secondary | ICD-10-CM

## 2020-02-11 DIAGNOSIS — Z23 Encounter for immunization: Secondary | ICD-10-CM | POA: Diagnosis not present

## 2020-02-11 DIAGNOSIS — E1165 Type 2 diabetes mellitus with hyperglycemia: Secondary | ICD-10-CM

## 2020-02-11 MED ORDER — DOXYCYCLINE HYCLATE 100 MG PO CAPS: 100.0000 mg | ORAL_CAPSULE | Freq: Two times a day (BID) | ORAL | 0 refills | Status: AC

## 2020-02-11 NOTE — Progress Notes (Addendum)
  Subjective:  Patient ID: GRYFFIN ALTICE, male    DOB: 25-Apr-1965,  MRN: [id number]  Chief Complaint  Patient presents with  . Foot Pain    left foot heel pain for about 3 weeks Pt has been taking doxycycline for it     54 y.o. male presents with the above complaint. History confirmed with patient.  He is referred to me by Dr. Elisha Ponder for evaluation of his left heel.  There has been a very painful soft area that is discolored and bruised looking for about 3 weeks which is very uncomfortable for him.  Objective:  Physical Exam: warm, good capillary refill, no trophic changes or ulcerative lesions and palpable DP pulses, weakly palpable PT.  Plantar left heel there is a area of fluctuance and bogginess which is very tender to touch, no clear evidence of puncture wound  Radiographs: Previous radiographs reviewed unclear if there is an artifact of a foreign body in the region of concern or if this is superposition of the coverings he was wearing when he had the x-ray taken Assessment:   1. Tinea pedis of left foot   2. Cellulitis and abscess of foot   3. Other foreign body or object entering through skin, initial encounter   4. Type II diabetes mellitus with peripheral circulatory disorder ([name])   5. Uncontrolled type 2 diabetes mellitus with hyperglycemia ([place])      Plan:  Patient was evaluated and treated and all questions answered.  Concerned there could be a small abscess or pressure injury here.  I recommended he offload the heels bilaterally as much as possible.  Rest as much as possible.  Continue doxycycline for an additional 7 days, refill of this was sent to his pharmacy.  Concern for an abscess or potentially a foreign body.  I recommended we evaluate with an ultrasound which I have ordered.  Return in about 2 weeks (around 02/25/2020) for wound re-check.

## 2020-02-12 DIAGNOSIS — I998 Other disorder of circulatory system: Secondary | ICD-10-CM | POA: Diagnosis not present

## 2020-02-12 DIAGNOSIS — M869 Osteomyelitis, unspecified: Secondary | ICD-10-CM | POA: Diagnosis not present

## 2020-02-16 DIAGNOSIS — N186 End stage renal disease: Secondary | ICD-10-CM | POA: Diagnosis not present

## 2020-02-16 DIAGNOSIS — D631 Anemia in chronic kidney disease: Secondary | ICD-10-CM | POA: Diagnosis not present

## 2020-02-16 DIAGNOSIS — N2581 Secondary hyperparathyroidism of renal origin: Secondary | ICD-10-CM | POA: Diagnosis not present

## 2020-02-16 DIAGNOSIS — Z23 Encounter for immunization: Secondary | ICD-10-CM | POA: Diagnosis not present

## 2020-02-16 DIAGNOSIS — D509 Iron deficiency anemia, unspecified: Secondary | ICD-10-CM | POA: Diagnosis not present

## 2020-02-18 DIAGNOSIS — G609 Hereditary and idiopathic neuropathy, unspecified: Secondary | ICD-10-CM | POA: Diagnosis not present

## 2020-02-18 DIAGNOSIS — E11319 Type 2 diabetes mellitus with unspecified diabetic retinopathy without macular edema: Secondary | ICD-10-CM | POA: Diagnosis not present

## 2020-02-18 DIAGNOSIS — I10 Essential (primary) hypertension: Secondary | ICD-10-CM | POA: Diagnosis not present

## 2020-02-18 DIAGNOSIS — Z9641 Presence of insulin pump (external) (internal): Secondary | ICD-10-CM | POA: Diagnosis not present

## 2020-02-18 DIAGNOSIS — E1165 Type 2 diabetes mellitus with hyperglycemia: Secondary | ICD-10-CM | POA: Diagnosis not present

## 2020-02-18 DIAGNOSIS — N186 End stage renal disease: Secondary | ICD-10-CM | POA: Diagnosis not present

## 2020-02-19 DIAGNOSIS — Z992 Dependence on renal dialysis: Secondary | ICD-10-CM | POA: Diagnosis not present

## 2020-02-19 DIAGNOSIS — D631 Anemia in chronic kidney disease: Secondary | ICD-10-CM | POA: Diagnosis not present

## 2020-02-19 DIAGNOSIS — N186 End stage renal disease: Secondary | ICD-10-CM | POA: Diagnosis not present

## 2020-02-19 DIAGNOSIS — D509 Iron deficiency anemia, unspecified: Secondary | ICD-10-CM | POA: Diagnosis not present

## 2020-02-19 DIAGNOSIS — N2581 Secondary hyperparathyroidism of renal origin: Secondary | ICD-10-CM | POA: Diagnosis not present

## 2020-02-19 DIAGNOSIS — Z23 Encounter for immunization: Secondary | ICD-10-CM | POA: Diagnosis not present

## 2020-02-20 DIAGNOSIS — N186 End stage renal disease: Secondary | ICD-10-CM | POA: Diagnosis not present

## 2020-02-20 DIAGNOSIS — D631 Anemia in chronic kidney disease: Secondary | ICD-10-CM | POA: Diagnosis not present

## 2020-02-20 DIAGNOSIS — N2581 Secondary hyperparathyroidism of renal origin: Secondary | ICD-10-CM | POA: Diagnosis not present

## 2020-02-21 DIAGNOSIS — E119 Type 2 diabetes mellitus without complications: Secondary | ICD-10-CM | POA: Diagnosis not present

## 2020-02-21 DIAGNOSIS — N186 End stage renal disease: Secondary | ICD-10-CM | POA: Diagnosis not present

## 2020-02-21 DIAGNOSIS — N2581 Secondary hyperparathyroidism of renal origin: Secondary | ICD-10-CM | POA: Diagnosis not present

## 2020-02-21 DIAGNOSIS — D631 Anemia in chronic kidney disease: Secondary | ICD-10-CM | POA: Diagnosis not present

## 2020-02-21 DIAGNOSIS — Z125 Encounter for screening for malignant neoplasm of prostate: Secondary | ICD-10-CM | POA: Diagnosis not present

## 2020-02-22 DIAGNOSIS — N186 End stage renal disease: Secondary | ICD-10-CM | POA: Diagnosis not present

## 2020-02-22 DIAGNOSIS — M79609 Pain in unspecified limb: Secondary | ICD-10-CM | POA: Diagnosis not present

## 2020-02-22 DIAGNOSIS — G894 Chronic pain syndrome: Secondary | ICD-10-CM | POA: Diagnosis not present

## 2020-02-22 DIAGNOSIS — G629 Polyneuropathy, unspecified: Secondary | ICD-10-CM | POA: Diagnosis not present

## 2020-02-23 DIAGNOSIS — N186 End stage renal disease: Secondary | ICD-10-CM | POA: Diagnosis not present

## 2020-02-23 DIAGNOSIS — D631 Anemia in chronic kidney disease: Secondary | ICD-10-CM | POA: Diagnosis not present

## 2020-02-23 DIAGNOSIS — N2581 Secondary hyperparathyroidism of renal origin: Secondary | ICD-10-CM | POA: Diagnosis not present

## 2020-02-25 ENCOUNTER — Ambulatory Visit (INDEPENDENT_AMBULATORY_CARE_PROVIDER_SITE_OTHER): Payer: Medicare Other | Admitting: Podiatry

## 2020-02-25 ENCOUNTER — Encounter: Payer: Self-pay | Admitting: Podiatry

## 2020-02-25 ENCOUNTER — Other Ambulatory Visit: Payer: Self-pay

## 2020-02-25 DIAGNOSIS — Z01818 Encounter for other preprocedural examination: Secondary | ICD-10-CM | POA: Diagnosis not present

## 2020-02-25 DIAGNOSIS — I5020 Unspecified systolic (congestive) heart failure: Secondary | ICD-10-CM | POA: Diagnosis not present

## 2020-02-25 DIAGNOSIS — L03119 Cellulitis of unspecified part of limb: Secondary | ICD-10-CM | POA: Diagnosis not present

## 2020-02-25 DIAGNOSIS — N2581 Secondary hyperparathyroidism of renal origin: Secondary | ICD-10-CM | POA: Diagnosis not present

## 2020-02-25 DIAGNOSIS — R9431 Abnormal electrocardiogram [ECG] [EKG]: Secondary | ICD-10-CM | POA: Diagnosis not present

## 2020-02-25 DIAGNOSIS — N186 End stage renal disease: Secondary | ICD-10-CM | POA: Diagnosis not present

## 2020-02-25 DIAGNOSIS — T8619 Other complication of kidney transplant: Secondary | ICD-10-CM | POA: Diagnosis not present

## 2020-02-25 DIAGNOSIS — Z9641 Presence of insulin pump (external) (internal): Secondary | ICD-10-CM | POA: Diagnosis not present

## 2020-02-25 DIAGNOSIS — I742 Embolism and thrombosis of arteries of the upper extremities: Secondary | ICD-10-CM | POA: Diagnosis not present

## 2020-02-25 DIAGNOSIS — I7301 Raynaud's syndrome with gangrene: Secondary | ICD-10-CM | POA: Diagnosis not present

## 2020-02-25 DIAGNOSIS — E0800 Diabetes mellitus due to underlying condition with hyperosmolarity without nonketotic hyperglycemic-hyperosmolar coma (NKHHC): Secondary | ICD-10-CM | POA: Diagnosis not present

## 2020-02-25 DIAGNOSIS — I823 Embolism and thrombosis of renal vein: Secondary | ICD-10-CM | POA: Diagnosis not present

## 2020-02-25 DIAGNOSIS — E1122 Type 2 diabetes mellitus with diabetic chronic kidney disease: Secondary | ICD-10-CM | POA: Diagnosis not present

## 2020-02-25 DIAGNOSIS — W458XXA Other foreign body or object entering through skin, initial encounter: Secondary | ICD-10-CM | POA: Diagnosis not present

## 2020-02-25 DIAGNOSIS — B353 Tinea pedis: Secondary | ICD-10-CM

## 2020-02-25 DIAGNOSIS — Z6841 Body Mass Index (BMI) 40.0 and over, adult: Secondary | ICD-10-CM | POA: Diagnosis not present

## 2020-02-25 DIAGNOSIS — I132 Hypertensive heart and chronic kidney disease with heart failure and with stage 5 chronic kidney disease, or end stage renal disease: Secondary | ICD-10-CM | POA: Diagnosis not present

## 2020-02-25 DIAGNOSIS — I10 Essential (primary) hypertension: Secondary | ICD-10-CM | POA: Diagnosis not present

## 2020-02-25 DIAGNOSIS — I5022 Chronic systolic (congestive) heart failure: Secondary | ICD-10-CM | POA: Diagnosis not present

## 2020-02-25 DIAGNOSIS — L02619 Cutaneous abscess of unspecified foot: Secondary | ICD-10-CM

## 2020-02-25 DIAGNOSIS — I998 Other disorder of circulatory system: Secondary | ICD-10-CM | POA: Diagnosis not present

## 2020-02-25 DIAGNOSIS — H3322 Serous retinal detachment, left eye: Secondary | ICD-10-CM | POA: Diagnosis not present

## 2020-02-25 DIAGNOSIS — G609 Hereditary and idiopathic neuropathy, unspecified: Secondary | ICD-10-CM | POA: Diagnosis not present

## 2020-02-25 DIAGNOSIS — I445 Left posterior fascicular block: Secondary | ICD-10-CM | POA: Diagnosis not present

## 2020-02-25 DIAGNOSIS — I950 Idiopathic hypotension: Secondary | ICD-10-CM | POA: Diagnosis not present

## 2020-02-25 DIAGNOSIS — D631 Anemia in chronic kidney disease: Secondary | ICD-10-CM | POA: Diagnosis not present

## 2020-02-25 DIAGNOSIS — Z940 Kidney transplant status: Secondary | ICD-10-CM | POA: Diagnosis not present

## 2020-02-25 NOTE — Progress Notes (Signed)
  Subjective:  Patient ID: [name] [name], male    DOB: 05/11/1965,  MRN: [id number]  Chief Complaint  Patient presents with  . Tinea Pedis    "Its doing alot better"    54 y.o. male returns with the above complaint. History confirmed with patient.  Feels a little better. Has been taking doxycycline and applying ketoconazole.  Has surgery on R hand planned next week  Objective:  Physical Exam: warm, good capillary refill, no trophic changes or ulcerative lesions and palpable DP pulses, weakly palpable PT.  Plantar left heel there is a area of fluctuance and bogginess which is very tender to touch, no clear evidence of puncture wound, it's improved since last visit but still very soft and tender    Assessment:   1. Tinea pedis of left foot   2. Cellulitis and abscess of foot   3. Other foreign body or object entering through skin, initial encounter      Plan:  Patient was evaluated and treated and all questions answered.  -Think it would still be beneficial to have US of the area in question -Finish antibiotic course as directed -Return in 2-3 weeks after Ultrasound  Return in about 3 weeks (around 03/17/2020).

## 2020-02-26 DIAGNOSIS — D631 Anemia in chronic kidney disease: Secondary | ICD-10-CM | POA: Diagnosis not present

## 2020-02-26 DIAGNOSIS — N2581 Secondary hyperparathyroidism of renal origin: Secondary | ICD-10-CM | POA: Diagnosis not present

## 2020-02-26 DIAGNOSIS — N186 End stage renal disease: Secondary | ICD-10-CM | POA: Diagnosis not present

## 2020-02-28 DIAGNOSIS — N2581 Secondary hyperparathyroidism of renal origin: Secondary | ICD-10-CM | POA: Diagnosis not present

## 2020-02-28 DIAGNOSIS — D631 Anemia in chronic kidney disease: Secondary | ICD-10-CM | POA: Diagnosis not present

## 2020-02-28 DIAGNOSIS — N186 End stage renal disease: Secondary | ICD-10-CM | POA: Diagnosis not present

## 2020-03-01 DIAGNOSIS — N2581 Secondary hyperparathyroidism of renal origin: Secondary | ICD-10-CM | POA: Diagnosis not present

## 2020-03-01 DIAGNOSIS — D631 Anemia in chronic kidney disease: Secondary | ICD-10-CM | POA: Diagnosis not present

## 2020-03-01 DIAGNOSIS — N186 End stage renal disease: Secondary | ICD-10-CM | POA: Diagnosis not present

## 2020-03-04 DIAGNOSIS — N186 End stage renal disease: Secondary | ICD-10-CM | POA: Diagnosis not present

## 2020-03-04 DIAGNOSIS — N2581 Secondary hyperparathyroidism of renal origin: Secondary | ICD-10-CM | POA: Diagnosis not present

## 2020-03-04 DIAGNOSIS — D631 Anemia in chronic kidney disease: Secondary | ICD-10-CM | POA: Diagnosis not present

## 2020-03-05 DIAGNOSIS — Z86718 Personal history of other venous thrombosis and embolism: Secondary | ICD-10-CM | POA: Diagnosis not present

## 2020-03-05 DIAGNOSIS — E875 Hyperkalemia: Secondary | ICD-10-CM | POA: Diagnosis not present

## 2020-03-05 DIAGNOSIS — Z794 Long term (current) use of insulin: Secondary | ICD-10-CM | POA: Diagnosis not present

## 2020-03-05 DIAGNOSIS — E1152 Type 2 diabetes mellitus with diabetic peripheral angiopathy with gangrene: Secondary | ICD-10-CM | POA: Diagnosis not present

## 2020-03-05 DIAGNOSIS — G2581 Restless legs syndrome: Secondary | ICD-10-CM | POA: Diagnosis not present

## 2020-03-05 DIAGNOSIS — K219 Gastro-esophageal reflux disease without esophagitis: Secondary | ICD-10-CM | POA: Diagnosis not present

## 2020-03-05 DIAGNOSIS — Z9641 Presence of insulin pump (external) (internal): Secondary | ICD-10-CM | POA: Diagnosis not present

## 2020-03-05 DIAGNOSIS — Z940 Kidney transplant status: Secondary | ICD-10-CM | POA: Diagnosis not present

## 2020-03-05 DIAGNOSIS — Z89021 Acquired absence of right finger(s): Secondary | ICD-10-CM | POA: Diagnosis not present

## 2020-03-05 DIAGNOSIS — E1122 Type 2 diabetes mellitus with diabetic chronic kidney disease: Secondary | ICD-10-CM | POA: Diagnosis not present

## 2020-03-05 DIAGNOSIS — M79644 Pain in right finger(s): Secondary | ICD-10-CM | POA: Diagnosis not present

## 2020-03-05 DIAGNOSIS — Z905 Acquired absence of kidney: Secondary | ICD-10-CM | POA: Diagnosis not present

## 2020-03-05 DIAGNOSIS — E1100 Type 2 diabetes mellitus with hyperosmolarity without nonketotic hyperglycemic-hyperosmolar coma (NKHHC): Secondary | ICD-10-CM | POA: Diagnosis not present

## 2020-03-05 DIAGNOSIS — N186 End stage renal disease: Secondary | ICD-10-CM | POA: Diagnosis not present

## 2020-03-05 DIAGNOSIS — G609 Hereditary and idiopathic neuropathy, unspecified: Secondary | ICD-10-CM | POA: Diagnosis not present

## 2020-03-05 DIAGNOSIS — N2581 Secondary hyperparathyroidism of renal origin: Secondary | ICD-10-CM | POA: Diagnosis not present

## 2020-03-05 DIAGNOSIS — M86141 Other acute osteomyelitis, right hand: Secondary | ICD-10-CM | POA: Diagnosis not present

## 2020-03-05 DIAGNOSIS — Z89011 Acquired absence of right thumb: Secondary | ICD-10-CM | POA: Diagnosis not present

## 2020-03-05 DIAGNOSIS — I132 Hypertensive heart and chronic kidney disease with heart failure and with stage 5 chronic kidney disease, or end stage renal disease: Secondary | ICD-10-CM | POA: Diagnosis not present

## 2020-03-05 DIAGNOSIS — Z992 Dependence on renal dialysis: Secondary | ICD-10-CM | POA: Diagnosis not present

## 2020-03-05 DIAGNOSIS — Z6841 Body Mass Index (BMI) 40.0 and over, adult: Secondary | ICD-10-CM | POA: Diagnosis not present

## 2020-03-05 DIAGNOSIS — I5022 Chronic systolic (congestive) heart failure: Secondary | ICD-10-CM | POA: Diagnosis not present

## 2020-03-05 DIAGNOSIS — D631 Anemia in chronic kidney disease: Secondary | ICD-10-CM | POA: Diagnosis not present

## 2020-03-05 DIAGNOSIS — I950 Idiopathic hypotension: Secondary | ICD-10-CM | POA: Diagnosis not present

## 2020-03-05 DIAGNOSIS — I96 Gangrene, not elsewhere classified: Secondary | ICD-10-CM | POA: Diagnosis not present

## 2020-03-05 DIAGNOSIS — I998 Other disorder of circulatory system: Secondary | ICD-10-CM | POA: Diagnosis not present

## 2020-03-05 DIAGNOSIS — I429 Cardiomyopathy, unspecified: Secondary | ICD-10-CM | POA: Diagnosis not present

## 2020-03-05 DIAGNOSIS — I10 Essential (primary) hypertension: Secondary | ICD-10-CM | POA: Diagnosis not present

## 2020-03-05 DIAGNOSIS — I7301 Raynaud's syndrome with gangrene: Secondary | ICD-10-CM | POA: Diagnosis not present

## 2020-03-08 DIAGNOSIS — N2581 Secondary hyperparathyroidism of renal origin: Secondary | ICD-10-CM | POA: Diagnosis not present

## 2020-03-08 DIAGNOSIS — N186 End stage renal disease: Secondary | ICD-10-CM | POA: Diagnosis not present

## 2020-03-08 DIAGNOSIS — D631 Anemia in chronic kidney disease: Secondary | ICD-10-CM | POA: Diagnosis not present

## 2020-03-11 DIAGNOSIS — N186 End stage renal disease: Secondary | ICD-10-CM | POA: Diagnosis not present

## 2020-03-11 DIAGNOSIS — D631 Anemia in chronic kidney disease: Secondary | ICD-10-CM | POA: Diagnosis not present

## 2020-03-11 DIAGNOSIS — N2581 Secondary hyperparathyroidism of renal origin: Secondary | ICD-10-CM | POA: Diagnosis not present

## 2020-03-13 DIAGNOSIS — D631 Anemia in chronic kidney disease: Secondary | ICD-10-CM | POA: Diagnosis not present

## 2020-03-13 DIAGNOSIS — N186 End stage renal disease: Secondary | ICD-10-CM | POA: Diagnosis not present

## 2020-03-13 DIAGNOSIS — N2581 Secondary hyperparathyroidism of renal origin: Secondary | ICD-10-CM | POA: Diagnosis not present

## 2020-03-16 DIAGNOSIS — N2581 Secondary hyperparathyroidism of renal origin: Secondary | ICD-10-CM | POA: Diagnosis not present

## 2020-03-16 DIAGNOSIS — D631 Anemia in chronic kidney disease: Secondary | ICD-10-CM | POA: Diagnosis not present

## 2020-03-16 DIAGNOSIS — N186 End stage renal disease: Secondary | ICD-10-CM | POA: Diagnosis not present

## 2020-03-18 DIAGNOSIS — N186 End stage renal disease: Secondary | ICD-10-CM | POA: Diagnosis not present

## 2020-03-18 DIAGNOSIS — N2581 Secondary hyperparathyroidism of renal origin: Secondary | ICD-10-CM | POA: Diagnosis not present

## 2020-03-18 DIAGNOSIS — D631 Anemia in chronic kidney disease: Secondary | ICD-10-CM | POA: Diagnosis not present

## 2020-03-20 DIAGNOSIS — N2581 Secondary hyperparathyroidism of renal origin: Secondary | ICD-10-CM | POA: Diagnosis not present

## 2020-03-20 DIAGNOSIS — D631 Anemia in chronic kidney disease: Secondary | ICD-10-CM | POA: Diagnosis not present

## 2020-03-20 DIAGNOSIS — N186 End stage renal disease: Secondary | ICD-10-CM | POA: Diagnosis not present

## 2020-03-21 DIAGNOSIS — N186 End stage renal disease: Secondary | ICD-10-CM | POA: Diagnosis not present

## 2020-03-21 DIAGNOSIS — Z992 Dependence on renal dialysis: Secondary | ICD-10-CM | POA: Diagnosis not present

## 2020-03-23 DIAGNOSIS — N186 End stage renal disease: Secondary | ICD-10-CM | POA: Diagnosis not present

## 2020-03-23 DIAGNOSIS — D631 Anemia in chronic kidney disease: Secondary | ICD-10-CM | POA: Diagnosis not present

## 2020-03-23 DIAGNOSIS — E785 Hyperlipidemia, unspecified: Secondary | ICD-10-CM | POA: Diagnosis not present

## 2020-03-23 DIAGNOSIS — N2581 Secondary hyperparathyroidism of renal origin: Secondary | ICD-10-CM | POA: Diagnosis not present

## 2020-03-24 ENCOUNTER — Other Ambulatory Visit: Payer: Self-pay

## 2020-03-24 ENCOUNTER — Ambulatory Visit (INDEPENDENT_AMBULATORY_CARE_PROVIDER_SITE_OTHER): Payer: Medicare Other | Admitting: Podiatry

## 2020-03-24 ENCOUNTER — Encounter: Payer: Self-pay | Admitting: Podiatry

## 2020-03-24 DIAGNOSIS — W458XXA Other foreign body or object entering through skin, initial encounter: Secondary | ICD-10-CM

## 2020-03-24 DIAGNOSIS — E1151 Type 2 diabetes mellitus with diabetic peripheral angiopathy without gangrene: Secondary | ICD-10-CM

## 2020-03-24 DIAGNOSIS — Z992 Dependence on renal dialysis: Secondary | ICD-10-CM

## 2020-03-24 DIAGNOSIS — N186 End stage renal disease: Secondary | ICD-10-CM | POA: Diagnosis not present

## 2020-03-24 DIAGNOSIS — L03119 Cellulitis of unspecified part of limb: Secondary | ICD-10-CM | POA: Diagnosis not present

## 2020-03-24 DIAGNOSIS — L02619 Cutaneous abscess of unspecified foot: Secondary | ICD-10-CM

## 2020-03-25 DIAGNOSIS — N186 End stage renal disease: Secondary | ICD-10-CM | POA: Diagnosis not present

## 2020-03-25 DIAGNOSIS — D631 Anemia in chronic kidney disease: Secondary | ICD-10-CM | POA: Diagnosis not present

## 2020-03-25 DIAGNOSIS — N2581 Secondary hyperparathyroidism of renal origin: Secondary | ICD-10-CM | POA: Diagnosis not present

## 2020-03-25 DIAGNOSIS — E785 Hyperlipidemia, unspecified: Secondary | ICD-10-CM | POA: Diagnosis not present

## 2020-03-25 NOTE — Progress Notes (Signed)
  Subjective:  Patient ID: [name] [name], male    DOB: January 03, 1966,  MRN: [id number]  Chief Complaint  Patient presents with  . Cellulitis    "its doing better, but it can really get sore sometimes"    55 y.o. male returns with the above complaint. History confirmed with patient.  Spot on his heel continues to be painful occasionally has not been able to get ultrasound yet.  Objective:  Physical Exam: warm, good capillary refill, no trophic changes or ulcerative lesions and palpable DP pulses, weakly palpable PT.  Plantar left heel there is a area of fluctuance and bogginess which is very tender to touch, no clear evidence of puncture wound, it's improved since last visit but still very soft and tender   Previous plain film radiographs: No evidence of foreign body or puncture wound or osteomyelitis Assessment:   1. Cellulitis and abscess of foot   2. Other foreign body or object entering through skin, initial encounter   3. Type II diabetes mellitus with peripheral circulatory disorder ([name])   4. ESRD on hemodialysis [facility])      Plan:  Patient was evaluated and treated and all questions answered.  -He has not been able to get the ultrasound yet, likely due to provider inability at [place], there ultrasonographer has been out.  Will order MRI.  Would like to order with contrast, the left coordinate with his dialysis schedule to do today before dialysis.  We will also discuss with his nephrologist Dr. Joesph July -Return after MRI for further care  No follow-ups on file.

## 2020-03-27 DIAGNOSIS — N2581 Secondary hyperparathyroidism of renal origin: Secondary | ICD-10-CM | POA: Diagnosis not present

## 2020-03-27 DIAGNOSIS — E785 Hyperlipidemia, unspecified: Secondary | ICD-10-CM | POA: Diagnosis not present

## 2020-03-27 DIAGNOSIS — N186 End stage renal disease: Secondary | ICD-10-CM | POA: Diagnosis not present

## 2020-03-27 DIAGNOSIS — D631 Anemia in chronic kidney disease: Secondary | ICD-10-CM | POA: Diagnosis not present

## 2020-03-27 DIAGNOSIS — E119 Type 2 diabetes mellitus without complications: Secondary | ICD-10-CM | POA: Diagnosis not present

## 2020-03-29 DIAGNOSIS — D631 Anemia in chronic kidney disease: Secondary | ICD-10-CM | POA: Diagnosis not present

## 2020-03-29 DIAGNOSIS — N186 End stage renal disease: Secondary | ICD-10-CM | POA: Diagnosis not present

## 2020-03-29 DIAGNOSIS — N2581 Secondary hyperparathyroidism of renal origin: Secondary | ICD-10-CM | POA: Diagnosis not present

## 2020-03-29 DIAGNOSIS — E785 Hyperlipidemia, unspecified: Secondary | ICD-10-CM | POA: Diagnosis not present

## 2020-04-01 DIAGNOSIS — D631 Anemia in chronic kidney disease: Secondary | ICD-10-CM | POA: Diagnosis not present

## 2020-04-01 DIAGNOSIS — E785 Hyperlipidemia, unspecified: Secondary | ICD-10-CM | POA: Diagnosis not present

## 2020-04-01 DIAGNOSIS — N2581 Secondary hyperparathyroidism of renal origin: Secondary | ICD-10-CM | POA: Diagnosis not present

## 2020-04-01 DIAGNOSIS — N186 End stage renal disease: Secondary | ICD-10-CM | POA: Diagnosis not present

## 2020-04-03 DIAGNOSIS — D631 Anemia in chronic kidney disease: Secondary | ICD-10-CM | POA: Diagnosis not present

## 2020-04-03 DIAGNOSIS — N2581 Secondary hyperparathyroidism of renal origin: Secondary | ICD-10-CM | POA: Diagnosis not present

## 2020-04-03 DIAGNOSIS — N186 End stage renal disease: Secondary | ICD-10-CM | POA: Diagnosis not present

## 2020-04-03 DIAGNOSIS — E785 Hyperlipidemia, unspecified: Secondary | ICD-10-CM | POA: Diagnosis not present

## 2020-04-05 DIAGNOSIS — E785 Hyperlipidemia, unspecified: Secondary | ICD-10-CM | POA: Diagnosis not present

## 2020-04-05 DIAGNOSIS — D631 Anemia in chronic kidney disease: Secondary | ICD-10-CM | POA: Diagnosis not present

## 2020-04-05 DIAGNOSIS — N186 End stage renal disease: Secondary | ICD-10-CM | POA: Diagnosis not present

## 2020-04-05 DIAGNOSIS — N2581 Secondary hyperparathyroidism of renal origin: Secondary | ICD-10-CM | POA: Diagnosis not present

## 2020-04-09 DIAGNOSIS — Z9641 Presence of insulin pump (external) (internal): Secondary | ICD-10-CM | POA: Diagnosis not present

## 2020-04-09 DIAGNOSIS — E1165 Type 2 diabetes mellitus with hyperglycemia: Secondary | ICD-10-CM | POA: Diagnosis not present

## 2020-04-10 DIAGNOSIS — N2581 Secondary hyperparathyroidism of renal origin: Secondary | ICD-10-CM | POA: Diagnosis not present

## 2020-04-10 DIAGNOSIS — E785 Hyperlipidemia, unspecified: Secondary | ICD-10-CM | POA: Diagnosis not present

## 2020-04-10 DIAGNOSIS — N186 End stage renal disease: Secondary | ICD-10-CM | POA: Diagnosis not present

## 2020-04-10 DIAGNOSIS — D631 Anemia in chronic kidney disease: Secondary | ICD-10-CM | POA: Diagnosis not present

## 2020-04-15 DIAGNOSIS — D631 Anemia in chronic kidney disease: Secondary | ICD-10-CM | POA: Diagnosis not present

## 2020-04-15 DIAGNOSIS — N2581 Secondary hyperparathyroidism of renal origin: Secondary | ICD-10-CM | POA: Diagnosis not present

## 2020-04-15 DIAGNOSIS — E785 Hyperlipidemia, unspecified: Secondary | ICD-10-CM | POA: Diagnosis not present

## 2020-04-15 DIAGNOSIS — N186 End stage renal disease: Secondary | ICD-10-CM | POA: Diagnosis not present

## 2020-04-16 ENCOUNTER — Other Ambulatory Visit: Payer: 59

## 2020-04-16 DIAGNOSIS — I7301 Raynaud's syndrome with gangrene: Secondary | ICD-10-CM | POA: Diagnosis not present

## 2020-04-16 DIAGNOSIS — G629 Polyneuropathy, unspecified: Secondary | ICD-10-CM | POA: Diagnosis not present

## 2020-04-16 DIAGNOSIS — G894 Chronic pain syndrome: Secondary | ICD-10-CM | POA: Diagnosis not present

## 2020-04-16 DIAGNOSIS — M79609 Pain in unspecified limb: Secondary | ICD-10-CM | POA: Diagnosis not present

## 2020-04-17 DIAGNOSIS — E785 Hyperlipidemia, unspecified: Secondary | ICD-10-CM | POA: Diagnosis not present

## 2020-04-17 DIAGNOSIS — Z114 Encounter for screening for human immunodeficiency virus [HIV]: Secondary | ICD-10-CM | POA: Diagnosis not present

## 2020-04-17 DIAGNOSIS — D631 Anemia in chronic kidney disease: Secondary | ICD-10-CM | POA: Diagnosis not present

## 2020-04-17 DIAGNOSIS — N186 End stage renal disease: Secondary | ICD-10-CM | POA: Diagnosis not present

## 2020-04-17 DIAGNOSIS — N2581 Secondary hyperparathyroidism of renal origin: Secondary | ICD-10-CM | POA: Diagnosis not present

## 2020-04-17 DIAGNOSIS — Z1159 Encounter for screening for other viral diseases: Secondary | ICD-10-CM | POA: Diagnosis not present

## 2020-04-18 ENCOUNTER — Other Ambulatory Visit: Payer: Self-pay | Admitting: Podiatry

## 2020-04-18 DIAGNOSIS — L03119 Cellulitis of unspecified part of limb: Secondary | ICD-10-CM

## 2020-04-18 DIAGNOSIS — L02619 Cutaneous abscess of unspecified foot: Secondary | ICD-10-CM

## 2020-04-18 NOTE — Telephone Encounter (Signed)
Please advise 

## 2020-04-18 NOTE — Telephone Encounter (Signed)
Dr. Sherryle Lis has been following him for his wound.

## 2020-04-21 DIAGNOSIS — N186 End stage renal disease: Secondary | ICD-10-CM | POA: Diagnosis not present

## 2020-04-21 DIAGNOSIS — Z992 Dependence on renal dialysis: Secondary | ICD-10-CM | POA: Diagnosis not present

## 2020-04-22 DIAGNOSIS — N2581 Secondary hyperparathyroidism of renal origin: Secondary | ICD-10-CM | POA: Diagnosis not present

## 2020-04-22 DIAGNOSIS — D631 Anemia in chronic kidney disease: Secondary | ICD-10-CM | POA: Diagnosis not present

## 2020-04-22 DIAGNOSIS — N186 End stage renal disease: Secondary | ICD-10-CM | POA: Diagnosis not present

## 2020-04-24 DIAGNOSIS — N186 End stage renal disease: Secondary | ICD-10-CM | POA: Diagnosis not present

## 2020-04-24 DIAGNOSIS — N2581 Secondary hyperparathyroidism of renal origin: Secondary | ICD-10-CM | POA: Diagnosis not present

## 2020-04-24 DIAGNOSIS — E119 Type 2 diabetes mellitus without complications: Secondary | ICD-10-CM | POA: Diagnosis not present

## 2020-04-24 DIAGNOSIS — D631 Anemia in chronic kidney disease: Secondary | ICD-10-CM | POA: Diagnosis not present

## 2020-04-24 NOTE — Telephone Encounter (Signed)
Approved.  

## 2020-04-26 DIAGNOSIS — D631 Anemia in chronic kidney disease: Secondary | ICD-10-CM | POA: Diagnosis not present

## 2020-04-26 DIAGNOSIS — N2581 Secondary hyperparathyroidism of renal origin: Secondary | ICD-10-CM | POA: Diagnosis not present

## 2020-04-26 DIAGNOSIS — N186 End stage renal disease: Secondary | ICD-10-CM | POA: Diagnosis not present

## 2020-04-29 DIAGNOSIS — N2581 Secondary hyperparathyroidism of renal origin: Secondary | ICD-10-CM | POA: Diagnosis not present

## 2020-04-29 DIAGNOSIS — D631 Anemia in chronic kidney disease: Secondary | ICD-10-CM | POA: Diagnosis not present

## 2020-04-29 DIAGNOSIS — N186 End stage renal disease: Secondary | ICD-10-CM | POA: Diagnosis not present

## 2020-05-01 DIAGNOSIS — N186 End stage renal disease: Secondary | ICD-10-CM | POA: Diagnosis not present

## 2020-05-01 DIAGNOSIS — N2581 Secondary hyperparathyroidism of renal origin: Secondary | ICD-10-CM | POA: Diagnosis not present

## 2020-05-01 DIAGNOSIS — D631 Anemia in chronic kidney disease: Secondary | ICD-10-CM | POA: Diagnosis not present

## 2020-05-03 DIAGNOSIS — N2581 Secondary hyperparathyroidism of renal origin: Secondary | ICD-10-CM | POA: Diagnosis not present

## 2020-05-03 DIAGNOSIS — N186 End stage renal disease: Secondary | ICD-10-CM | POA: Diagnosis not present

## 2020-05-03 DIAGNOSIS — D631 Anemia in chronic kidney disease: Secondary | ICD-10-CM | POA: Diagnosis not present

## 2020-05-06 DIAGNOSIS — N2581 Secondary hyperparathyroidism of renal origin: Secondary | ICD-10-CM | POA: Diagnosis not present

## 2020-05-06 DIAGNOSIS — D631 Anemia in chronic kidney disease: Secondary | ICD-10-CM | POA: Diagnosis not present

## 2020-05-06 DIAGNOSIS — N186 End stage renal disease: Secondary | ICD-10-CM | POA: Diagnosis not present

## 2020-05-07 ENCOUNTER — Telehealth: Payer: Self-pay | Admitting: *Deleted

## 2020-05-07 ENCOUNTER — Ambulatory Visit
Admission: RE | Admit: 2020-05-07 | Discharge: 2020-05-07 | Disposition: A | Discharge status: Home / Self Care | Payer: Medicare Other | Source: Ambulatory Visit | Attending: Podiatry | Admitting: Podiatry

## 2020-05-07 DIAGNOSIS — L02619 Cutaneous abscess of unspecified foot: Secondary | ICD-10-CM

## 2020-05-07 DIAGNOSIS — W458XXA Other foreign body or object entering through skin, initial encounter: Secondary | ICD-10-CM

## 2020-05-07 DIAGNOSIS — M7989 Other specified soft tissue disorders: Secondary | ICD-10-CM | POA: Diagnosis not present

## 2020-05-07 DIAGNOSIS — L03119 Cellulitis of unspecified part of limb: Secondary | ICD-10-CM

## 2020-05-07 DIAGNOSIS — E11621 Type 2 diabetes mellitus with foot ulcer: Secondary | ICD-10-CM | POA: Diagnosis not present

## 2020-05-07 DIAGNOSIS — S91302A Unspecified open wound, left foot, initial encounter: Secondary | ICD-10-CM | POA: Diagnosis not present

## 2020-05-07 DIAGNOSIS — Z872 Personal history of diseases of the skin and subcutaneous tissue: Secondary | ICD-10-CM | POA: Diagnosis not present

## 2020-05-07 MED ORDER — GADOBUTROL 1 MMOL/ML IV SOLN
10.0000 mL | Freq: Once | INTRAVENOUS | Status: AC | PRN
  Administered 2020-05-07: 10 mL via INTRAVENOUS

## 2020-05-07 NOTE — Telephone Encounter (Signed)
Patient is returning a missed call stating that someone from our office had called him. Returned call to patient and left Message that no call was notated in our system but that he do have an upcoming appointment on 05/13/20 w/ Dr Elisha Ponder (9:00).

## 2020-05-08 DIAGNOSIS — N2581 Secondary hyperparathyroidism of renal origin: Secondary | ICD-10-CM | POA: Diagnosis not present

## 2020-05-08 DIAGNOSIS — N186 End stage renal disease: Secondary | ICD-10-CM | POA: Diagnosis not present

## 2020-05-08 DIAGNOSIS — D631 Anemia in chronic kidney disease: Secondary | ICD-10-CM | POA: Diagnosis not present

## 2020-05-10 DIAGNOSIS — N186 End stage renal disease: Secondary | ICD-10-CM | POA: Diagnosis not present

## 2020-05-10 DIAGNOSIS — N2581 Secondary hyperparathyroidism of renal origin: Secondary | ICD-10-CM | POA: Diagnosis not present

## 2020-05-10 DIAGNOSIS — D631 Anemia in chronic kidney disease: Secondary | ICD-10-CM | POA: Diagnosis not present

## 2020-05-12 ENCOUNTER — Encounter (HOSPITAL_COMMUNITY): Payer: Self-pay | Admitting: Internal Medicine

## 2020-05-12 ENCOUNTER — Ambulatory Visit (INDEPENDENT_AMBULATORY_CARE_PROVIDER_SITE_OTHER): Payer: Medicare Other | Admitting: Podiatry

## 2020-05-12 ENCOUNTER — Other Ambulatory Visit: Payer: Self-pay

## 2020-05-12 ENCOUNTER — Encounter (HOSPITAL_COMMUNITY): Payer: Self-pay | Admitting: Emergency Medicine

## 2020-05-12 ENCOUNTER — Inpatient Hospital Stay (HOSPITAL_COMMUNITY)
Admission: EM | Admit: 2020-05-12 | Discharge: 2020-05-16 | DRG: 623 | Disposition: A | Discharge status: Home Health | Payer: Medicare Other | Attending: Internal Medicine | Admitting: Internal Medicine

## 2020-05-12 ENCOUNTER — Encounter: Payer: Self-pay | Admitting: Podiatry

## 2020-05-12 DIAGNOSIS — N2581 Secondary hyperparathyroidism of renal origin: Secondary | ICD-10-CM | POA: Diagnosis not present

## 2020-05-12 DIAGNOSIS — I132 Hypertensive heart and chronic kidney disease with heart failure and with stage 5 chronic kidney disease, or end stage renal disease: Secondary | ICD-10-CM | POA: Diagnosis present

## 2020-05-12 DIAGNOSIS — E1142 Type 2 diabetes mellitus with diabetic polyneuropathy: Secondary | ICD-10-CM | POA: Diagnosis present

## 2020-05-12 DIAGNOSIS — M79674 Pain in right toe(s): Secondary | ICD-10-CM

## 2020-05-12 DIAGNOSIS — E7800 Pure hypercholesterolemia, unspecified: Secondary | ICD-10-CM

## 2020-05-12 DIAGNOSIS — Z20822 Contact with and (suspected) exposure to covid-19: Secondary | ICD-10-CM | POA: Diagnosis not present

## 2020-05-12 DIAGNOSIS — L97419 Non-pressure chronic ulcer of right heel and midfoot with unspecified severity: Secondary | ICD-10-CM | POA: Diagnosis not present

## 2020-05-12 DIAGNOSIS — E1129 Type 2 diabetes mellitus with other diabetic kidney complication: Secondary | ICD-10-CM | POA: Diagnosis present

## 2020-05-12 DIAGNOSIS — M1000 Idiopathic gout, unspecified site: Secondary | ICD-10-CM | POA: Diagnosis present

## 2020-05-12 DIAGNOSIS — E1122 Type 2 diabetes mellitus with diabetic chronic kidney disease: Secondary | ICD-10-CM | POA: Diagnosis not present

## 2020-05-12 DIAGNOSIS — I739 Peripheral vascular disease, unspecified: Secondary | ICD-10-CM | POA: Diagnosis not present

## 2020-05-12 DIAGNOSIS — Z992 Dependence on renal dialysis: Secondary | ICD-10-CM | POA: Diagnosis not present

## 2020-05-12 DIAGNOSIS — Z9114 Patient's other noncompliance with medication regimen: Secondary | ICD-10-CM

## 2020-05-12 DIAGNOSIS — Z6839 Body mass index (BMI) 39.0-39.9, adult: Secondary | ICD-10-CM

## 2020-05-12 DIAGNOSIS — Z66 Do not resuscitate: Secondary | ICD-10-CM | POA: Diagnosis not present

## 2020-05-12 DIAGNOSIS — E1169 Type 2 diabetes mellitus with other specified complication: Principal | ICD-10-CM | POA: Diagnosis present

## 2020-05-12 DIAGNOSIS — Z79899 Other long term (current) drug therapy: Secondary | ICD-10-CM

## 2020-05-12 DIAGNOSIS — IMO0002 Reserved for concepts with insufficient information to code with codable children: Secondary | ICD-10-CM | POA: Diagnosis present

## 2020-05-12 DIAGNOSIS — I5042 Chronic combined systolic (congestive) and diastolic (congestive) heart failure: Secondary | ICD-10-CM | POA: Diagnosis present

## 2020-05-12 DIAGNOSIS — R600 Localized edema: Secondary | ICD-10-CM | POA: Diagnosis not present

## 2020-05-12 DIAGNOSIS — M79675 Pain in left toe(s): Secondary | ICD-10-CM | POA: Diagnosis not present

## 2020-05-12 DIAGNOSIS — B351 Tinea unguium: Secondary | ICD-10-CM

## 2020-05-12 DIAGNOSIS — L089 Local infection of the skin and subcutaneous tissue, unspecified: Secondary | ICD-10-CM | POA: Diagnosis not present

## 2020-05-12 DIAGNOSIS — Y830 Surgical operation with transplant of whole organ as the cause of abnormal reaction of the patient, or of later complication, without mention of misadventure at the time of the procedure: Secondary | ICD-10-CM | POA: Diagnosis present

## 2020-05-12 DIAGNOSIS — I7300 Raynaud's syndrome without gangrene: Secondary | ICD-10-CM | POA: Diagnosis present

## 2020-05-12 DIAGNOSIS — Z833 Family history of diabetes mellitus: Secondary | ICD-10-CM

## 2020-05-12 DIAGNOSIS — E785 Hyperlipidemia, unspecified: Secondary | ICD-10-CM | POA: Diagnosis present

## 2020-05-12 DIAGNOSIS — E669 Obesity, unspecified: Secondary | ICD-10-CM | POA: Diagnosis present

## 2020-05-12 DIAGNOSIS — L97511 Non-pressure chronic ulcer of other part of right foot limited to breakdown of skin: Secondary | ICD-10-CM

## 2020-05-12 DIAGNOSIS — L97429 Non-pressure chronic ulcer of left heel and midfoot with unspecified severity: Secondary | ICD-10-CM | POA: Diagnosis present

## 2020-05-12 DIAGNOSIS — E11628 Type 2 diabetes mellitus with other skin complications: Secondary | ICD-10-CM | POA: Diagnosis present

## 2020-05-12 DIAGNOSIS — T8612 Kidney transplant failure: Secondary | ICD-10-CM | POA: Diagnosis not present

## 2020-05-12 DIAGNOSIS — L03119 Cellulitis of unspecified part of limb: Secondary | ICD-10-CM | POA: Diagnosis not present

## 2020-05-12 DIAGNOSIS — F419 Anxiety disorder, unspecified: Secondary | ICD-10-CM | POA: Diagnosis present

## 2020-05-12 DIAGNOSIS — N186 End stage renal disease: Secondary | ICD-10-CM | POA: Diagnosis not present

## 2020-05-12 DIAGNOSIS — E1165 Type 2 diabetes mellitus with hyperglycemia: Secondary | ICD-10-CM

## 2020-05-12 DIAGNOSIS — I255 Ischemic cardiomyopathy: Secondary | ICD-10-CM | POA: Diagnosis not present

## 2020-05-12 DIAGNOSIS — M86172 Other acute osteomyelitis, left ankle and foot: Secondary | ICD-10-CM | POA: Diagnosis not present

## 2020-05-12 DIAGNOSIS — L97519 Non-pressure chronic ulcer of other part of right foot with unspecified severity: Secondary | ICD-10-CM | POA: Diagnosis not present

## 2020-05-12 DIAGNOSIS — L97529 Non-pressure chronic ulcer of other part of left foot with unspecified severity: Secondary | ICD-10-CM | POA: Diagnosis not present

## 2020-05-12 DIAGNOSIS — I5022 Chronic systolic (congestive) heart failure: Secondary | ICD-10-CM | POA: Diagnosis not present

## 2020-05-12 DIAGNOSIS — Z9641 Presence of insulin pump (external) (internal): Secondary | ICD-10-CM | POA: Diagnosis present

## 2020-05-12 DIAGNOSIS — E11621 Type 2 diabetes mellitus with foot ulcer: Secondary | ICD-10-CM | POA: Diagnosis present

## 2020-05-12 DIAGNOSIS — M7989 Other specified soft tissue disorders: Secondary | ICD-10-CM | POA: Diagnosis not present

## 2020-05-12 DIAGNOSIS — Z794 Long term (current) use of insulin: Secondary | ICD-10-CM | POA: Diagnosis not present

## 2020-05-12 DIAGNOSIS — L97509 Non-pressure chronic ulcer of other part of unspecified foot with unspecified severity: Secondary | ICD-10-CM | POA: Diagnosis present

## 2020-05-12 DIAGNOSIS — D631 Anemia in chronic kidney disease: Secondary | ICD-10-CM | POA: Diagnosis present

## 2020-05-12 DIAGNOSIS — E119 Type 2 diabetes mellitus without complications: Secondary | ICD-10-CM | POA: Diagnosis not present

## 2020-05-12 DIAGNOSIS — D649 Anemia, unspecified: Secondary | ICD-10-CM

## 2020-05-12 DIAGNOSIS — I70234 Atherosclerosis of native arteries of right leg with ulceration of heel and midfoot: Secondary | ICD-10-CM | POA: Diagnosis not present

## 2020-05-12 DIAGNOSIS — M869 Osteomyelitis, unspecified: Secondary | ICD-10-CM | POA: Diagnosis present

## 2020-05-12 DIAGNOSIS — I70229 Atherosclerosis of native arteries of extremities with rest pain, unspecified extremity: Secondary | ICD-10-CM | POA: Diagnosis not present

## 2020-05-12 DIAGNOSIS — M868X8 Other osteomyelitis, other site: Secondary | ICD-10-CM | POA: Diagnosis not present

## 2020-05-12 DIAGNOSIS — I70244 Atherosclerosis of native arteries of left leg with ulceration of heel and midfoot: Secondary | ICD-10-CM | POA: Diagnosis not present

## 2020-05-12 DIAGNOSIS — L02619 Cutaneous abscess of unspecified foot: Secondary | ICD-10-CM

## 2020-05-12 DIAGNOSIS — I10 Essential (primary) hypertension: Secondary | ICD-10-CM | POA: Diagnosis present

## 2020-05-12 DIAGNOSIS — Z7982 Long term (current) use of aspirin: Secondary | ICD-10-CM | POA: Diagnosis not present

## 2020-05-12 DIAGNOSIS — L97524 Non-pressure chronic ulcer of other part of left foot with necrosis of bone: Secondary | ICD-10-CM | POA: Diagnosis not present

## 2020-05-12 LAB — CBC WITH DIFFERENTIAL/PLATELET
Abs Immature Granulocytes: 0.03 10*3/uL (ref 0.00–0.07)
Basophils Absolute: 0.1 10*3/uL (ref 0.0–0.1)
Basophils Relative: 1 %
Eosinophils Absolute: 0.1 10*3/uL (ref 0.0–0.5)
Eosinophils Relative: 1 %
HCT: 37.6 % — ABNORMAL LOW (ref 39.0–52.0)
Hemoglobin: 10.8 g/dL — ABNORMAL LOW (ref 13.0–17.0)
Immature Granulocytes: 0 %
Lymphocytes Relative: 10 %
Lymphs Abs: 0.7 10*3/uL (ref 0.7–4.0)
MCH: 29.2 pg (ref 26.0–34.0)
MCHC: 28.7 g/dL — ABNORMAL LOW (ref 30.0–36.0)
MCV: 101.6 fL — ABNORMAL HIGH (ref 80.0–100.0)
Monocytes Absolute: 0.8 10*3/uL (ref 0.1–1.0)
Monocytes Relative: 11 %
Neutro Abs: 5.6 10*3/uL (ref 1.7–7.7)
Neutrophils Relative %: 77 %
Platelets: 321 10*3/uL (ref 150–400)
RBC: 3.7 MIL/uL — ABNORMAL LOW (ref 4.22–5.81)
RDW: 17.6 % — ABNORMAL HIGH (ref 11.5–15.5)
WBC: 7.3 10*3/uL (ref 4.0–10.5)
nRBC: 0 % (ref 0.0–0.2)

## 2020-05-12 LAB — COMPREHENSIVE METABOLIC PANEL
ALT: 13 U/L (ref 0–44)
AST: 14 U/L — ABNORMAL LOW (ref 15–41)
Albumin: 2.4 g/dL — ABNORMAL LOW (ref 3.5–5.0)
Alkaline Phosphatase: 261 U/L — ABNORMAL HIGH (ref 38–126)
Anion gap: 16 — ABNORMAL HIGH (ref 5–15)
BUN: 41 mg/dL — ABNORMAL HIGH (ref 6–20)
CO2: 24 mmol/L (ref 22–32)
Calcium: 8.7 mg/dL — ABNORMAL LOW (ref 8.9–10.3)
Chloride: 99 mmol/L (ref 98–111)
Creatinine, Ser: 9.56 mg/dL — ABNORMAL HIGH (ref 0.61–1.24)
GFR, Estimated: 6 mL/min — ABNORMAL LOW (ref >60)
Glucose, Bld: 96 mg/dL (ref 70–99)
Potassium: 5 mmol/L (ref 3.5–5.1)
Sodium: 139 mmol/L (ref 135–145)
Total Bilirubin: 0.7 mg/dL (ref 0.3–1.2)
Total Protein: 6.8 g/dL (ref 6.5–8.1)

## 2020-05-12 LAB — LACTIC ACID, PLASMA
Lactic Acid, Venous: 2 mmol/L (ref 0.5–1.9)
Lactic Acid, Venous: 1.9 mmol/L (ref 0.5–1.9)

## 2020-05-12 LAB — CBG MONITORING, ED: Glucose-Capillary: 73 mg/dL (ref 70–99)

## 2020-05-12 MED ORDER — VANCOMYCIN HCL 2000 MG/400ML IV SOLN
2000.0000 mg | Freq: Once | INTRAVENOUS | Status: AC
  Administered 2020-05-12: 2000 mg via INTRAVENOUS
  Filled 2020-05-12: qty 400

## 2020-05-12 MED ORDER — ACETAMINOPHEN 325 MG PO TABS: 650.0000 mg | ORAL_TABLET | Freq: Four times a day (QID) | ORAL | Status: DC | PRN

## 2020-05-12 MED ORDER — INSULIN ASPART 100 UNIT/ML ~~LOC~~ SOLN
0.0000 [IU] | SUBCUTANEOUS | Status: DC
  Administered 2020-05-14 – 2020-05-16 (×3): 1 [IU] via SUBCUTANEOUS

## 2020-05-12 MED ORDER — METRONIDAZOLE IN NACL 5-0.79 MG/ML-% IV SOLN
500.0000 mg | Freq: Three times a day (TID) | INTRAVENOUS | Status: DC
  Administered 2020-05-12 – 2020-05-16 (×9): 500 mg via INTRAVENOUS
  Filled 2020-05-12 (×9): qty 100

## 2020-05-12 MED ORDER — SODIUM CHLORIDE 0.9 % IV SOLN
2.0000 g | Freq: Once | INTRAVENOUS | Status: AC
  Administered 2020-05-12: 2 g via INTRAVENOUS
  Filled 2020-05-12: qty 20

## 2020-05-12 MED ORDER — SODIUM CHLORIDE 0.9% FLUSH
3.0000 mL | Freq: Two times a day (BID) | INTRAVENOUS | Status: DC
  Administered 2020-05-15: 3 mL via INTRAVENOUS

## 2020-05-12 MED ORDER — SODIUM CHLORIDE 0.9% FLUSH: 3.0000 mL | INTRAVENOUS | Status: DC | PRN

## 2020-05-12 MED ORDER — ACETAMINOPHEN 650 MG RE SUPP: 650.0000 mg | Freq: Four times a day (QID) | RECTAL | Status: DC | PRN

## 2020-05-12 MED ORDER — SODIUM CHLORIDE 0.9 % IV SOLN
2.0000 g | INTRAVENOUS | Status: DC
  Administered 2020-05-13 – 2020-05-15 (×2): 2 g via INTRAVENOUS
  Filled 2020-05-12 (×3): qty 20

## 2020-05-12 MED ORDER — HYDROCODONE-ACETAMINOPHEN 5-325 MG PO TABS
1.0000 | ORAL_TABLET | ORAL | Status: DC | PRN
  Administered 2020-05-13: 1 via ORAL
  Administered 2020-05-15 – 2020-05-16 (×3): 2 via ORAL
  Filled 2020-05-12: qty 1
  Filled 2020-05-12 (×4): qty 2

## 2020-05-12 MED ORDER — SODIUM CHLORIDE 0.9 % IV SOLN
250.0000 mL | INTRAVENOUS | Status: DC | PRN
  Administered 2020-05-13 – 2020-05-14 (×3): 250 mL via INTRAVENOUS

## 2020-05-12 MED ORDER — VANCOMYCIN HCL IN DEXTROSE 1-5 GM/200ML-% IV SOLN
1000.0000 mg | INTRAVENOUS | Status: DC
  Administered 2020-05-15: 1000 mg via INTRAVENOUS
  Filled 2020-05-12 (×2): qty 200

## 2020-05-12 NOTE — ED Notes (Signed)
ED Provider at bedside. 

## 2020-05-12 NOTE — ED Triage Notes (Signed)
Pt arrives to ED with left foot pain sent from podiatry per patient due to diabetic heel ulcer that he had MRI on 2/16 to rule out a foreign body. Pt has results of MRI in chart.

## 2020-05-12 NOTE — H&P (Signed)
[name] [name] [id number] DOB: Aug 08, 1965 DOA: 05/12/2020     PCP: [name], MD   Outpatient Specialists:     NEphrology: [place]  Patient arrived to ER on 05/12/20 at 1310 Referred by Attending Quintella Reichert, MD   Patient coming from: home Lives  With family    Chief Complaint:   Chief Complaint  Patient presents with  . Foot Pain    HPI: [name] [name] is a 55 y.o. male with medical history significant of ESRD on HD, DM 2, gout peripheral neuropathy, diastolic.systolic CHF, status post failed renal transplant    Presented with plantar heel wound on left and small on the right. Have had significant throbbing pain has been getting progressively very much worse.  Worse with ambulation. His mother has been helping him take care of it at home.  He was seen by podiatry today.  Had undergone nail debridement Given ulceration increased pain patient was instructed to go to emergency department for IV antibiotics possible surgical intervention It seems that his left foot wound has become more painful per the past few weeks. He has had any fever or chills no nausea no vomiting no diarrhea  Last week patient had MRI study done of his left lower extremity on 16 February Could not exclude early osteomyelitis also possibility of tiny foreign body or soft tissue emphysema was noted Tendinosis and possible partial tearing of the medial cord of the plantar fascia  Last HD Saturday Due for HD tomorrow  Infectious risk factors:  Reports none     Has  been vaccinated against COVID    Initial COVID TEST   in house  PCR testing  Pending  No results found for: SARSCOV2NAA   Regarding pertinent Chronic problems:     Hyperlipidemia -  on statins lipitor    HTN on has been low, on Miodrine   chronic CHF diastolic/systolic combined- last echo 2016 EF [id number] grade 1 diastolic dysfunction   DM 2 -  Lab Results  Component Value Date   HGBA1C 10.7 (H)  03/13/2016    on insulin pump    obesity-   BMI Readings from Last 1 Encounters:  05/12/20 39.95 kg/m     End-stage renal disease on hemodialysis  Lab Results  Component Value Date   CREATININE 9.56 (H) 05/12/2020   CREATININE 12.32 (H) 01/10/2020   CREATININE 9.21 (H) 04/12/2019     Chronic anemia - baseline hg Hemoglobin & Hematocrit  Recent Labs    01/10/20 1837 05/12/20 1319  HGB 13.1 10.8*   While in ER: Started on vancomycin and ceftriaxone  Lactic acid 2.0 Could not get 2  ER Provider Called:  Podiatry   Dr. Jacqualyn Posey They Recommend admit to medicine  Order ABI, NPO post midnight Will see in AM   Hospitalist was called for admission for left foot osteomyelitis secondary to diabetic foot ulcer  The following Work up has been ordered so far:  Orders Placed This Encounter  Procedures  . Blood culture (routine x 2)  . SARS CORONAVIRUS 2 (TAT 6-24 HRS) Nasopharyngeal Nasopharyngeal Swab  . Lactic acid, plasma  . Comprehensive metabolic panel  . CBC with Differential  . Urinalysis, Routine w reflex microscopic  . Saline Lock IV, Maintain IV access  . Consult to vascular surgery  ALL PATIENTS BEING ADMITTED/HAVING PROCEDURES NEED COVID-19 SCREENING  . vancomycin per pharmacy consult  . Consult to podiatry Consult Timeframe: STAT - requires a response within one hour;  STAT timeframe requires provider to provider communication, has the provider to provider communication been completed: Yes; Reason for Consult? Osteo  . Consult for [place] Admission  ALL PATIENTS BEING ADMITTED/HAVING PROCEDURES NEED COVID-19 SCREENING  . Airborne and Contact precautions   Following Medications were ordered in ER: Medications  cefTRIAXone (ROCEPHIN) 2 g in sodium chloride 0.9 % 100 mL IVPB (2 g Intravenous New Bag/Given 05/12/20 2121)  vancomycin (VANCOREADY) IVPB 2000 mg/400 mL (has no administration in time range)  vancomycin (VANCOCIN) IVPB 1000 mg/200 mL premix (has  no administration in time range)     Significant initial  Findings: Abnormal Labs Reviewed  LACTIC ACID, PLASMA - Abnormal; Notable for the following components:      Result Value   Lactic Acid, Venous 2.0 (*)    All other components within normal limits  COMPREHENSIVE METABOLIC PANEL - Abnormal; Notable for the following components:   BUN 41 (*)    Creatinine, Ser 9.56 (*)    Calcium 8.7 (*)    Albumin 2.4 (*)    AST 14 (*)    Alkaline Phosphatase 261 (*)    GFR, Estimated 6 (*)    Anion gap 16 (*)    All other components within normal limits  CBC WITH DIFFERENTIAL/PLATELET - Abnormal; Notable for the following components:   RBC 3.70 (*)    Hemoglobin 10.8 (*)    HCT 37.6 (*)    MCV 101.6 (*)    MCHC 28.7 (*)    RDW 17.6 (*)    All other components within normal limits    Otherwise labs showing: Recent Labs  Lab 05/12/20 1319  NA 139  K 5.0  CO2 24  GLUCOSE 96  BUN 41*  CREATININE 9.56*  CALCIUM 8.7*    Cr   Up from baseline see below Lab Results  Component Value Date   CREATININE 9.56 (H) 05/12/2020   CREATININE 12.32 (H) 01/10/2020   CREATININE 9.21 (H) 04/12/2019    Recent Labs  Lab 05/12/20 1319  AST 14*  ALT 13  ALKPHOS 261*  BILITOT 0.7  PROT 6.8  ALBUMIN 2.4*   Lab Results  Component Value Date   CALCIUM 8.7 (L) 05/12/2020   PHOS 5.5 (H) 03/14/2016      WBC      Component Value Date/Time   WBC 7.3 05/12/2020 1319   LYMPHSABS 0.7 05/12/2020 1319   MONOABS 0.8 05/12/2020 1319   EOSABS 0.1 05/12/2020 1319   BASOSABS 0.1 05/12/2020 1319    Plt: Lab Results  Component Value Date   PLT 321 05/12/2020    Lactic Acid, Venous    Component Value Date/Time   LATICACIDVEN 2.0 (HH) 05/12/2020 1319   Lactic Acid, Venous    Component Value Date/Time   LATICACIDVEN 1.9 05/12/2020 2129       HG/HCT stable     Component Value Date/Time   HGB 10.8 (L) 05/12/2020 1319   HCT 37.6 (L) 05/12/2020 1319   MCV 101.6 (H) 05/12/2020 1319      ECG: Ordered    DM  labs:  HbA1C: No results for input(s): HGBA1C in the last 8760 hours.     CBG (last 3)  Recent Labs    05/12/20 2318  GLUCAP 73       ED Triage Vitals  Enc Vitals Group     BP 05/12/20 1316 (!) 121/102     Pulse Rate 05/12/20 1316 94     Resp 05/12/20 1316 17  Temp 05/12/20 1316 98.6 F (37 C)     Temp Source 05/12/20 1316 Oral     SpO2 05/12/20 1316 98 %     Weight 05/12/20 2000 240 lb 1.3 oz (108.9 kg)     Height 05/12/20 2000 '5\' 5"'$  (1.651 m)     Head Circumference --      Peak Flow --      Pain Score --      Pain Loc --      Pain Edu? --      Excl. in [place]? --   TMAX(24)@       Latest  Blood pressure 140/78, pulse (!) 101, temperature 98 F (36.7 C), resp. rate 16, height '5\' 5"'$  (1.651 m), weight 108.9 kg, SpO2 95 %.    Review of Systems:    Pertinent positives include:  fatigue, left foot pain  Constitutional:  No weight loss, night sweats, Fevers, chills, weight loss  HEENT:  No headaches, Difficulty swallowing,Tooth/dental problems,Sore throat,  No sneezing, itching, ear ache, nasal congestion, post nasal drip,  Cardio-vascular:  No chest pain, Orthopnea, PND, anasarca, dizziness, palpitations.no Bilateral lower extremity swelling  GI:  No heartburn, indigestion, abdominal pain, nausea, vomiting, diarrhea, change in bowel habits, loss of appetite, melena, blood in stool, hematemesis Resp:  no shortness of breath at rest. No dyspnea on exertion, No excess mucus, no productive cough, No non-productive cough, No coughing up of blood.No change in color of mucus.No wheezing. Skin:  no rash or lesions. No jaundice GU:  no dysuria, change in color of urine, no urgency or frequency. No straining to urinate.  No flank pain.  Musculoskeletal:  No joint pain or no joint swelling. No decreased range of motion. No back pain.  Psych:  No change in mood or affect. No depression or anxiety. No memory loss.  Neuro: no localizing neurological  complaints, no tingling, no weakness, no double vision, no gait abnormality, no slurred speech, no confusion  All systems reviewed and apart from [place] all are negative  Past Medical History:   Past Medical History:  Diagnosis Date  . Anal infection    posterior anal canal  . Anemia, chronic renal failure   . Atrophic kidney    BILATERAL  . DM type 2 causing ESRD [facility])    Nephrologist-- dr [name] [facility])--  on hemodialysis since June 2012 at  [name]  TTS  . Hemodialysis patient [facility])    at [place] on Tues/ Thur/Sat/schedule  . Hemorrhoids   . Hepatitis B antibody positive   . History of pleural effusion    bilateral  . Hyperparathyroidism, secondary renal ([place])   . Hypertension   . Ischemic cardiomyopathy    per echo 07-01-2014  ef 45%  . LAFB (left anterior fascicular block)   . Peripheral neuropathy   . Systolic and diastolic CHF, chronic ([place])    CARDIOLOGIST-  DR Daneen Schick ([place])  AND DR [name] ([name])      Past Surgical History:  Procedure Laterality Date  . APPENDECTOMY  09-12-2004   laparotomy w/ drainage peritinitis  . AV FISTULA PLACEMENT  02-27-2010   right forearm (RADIOCEPHALIC)  . AV FISTULA REPAIR  10-30-2010  . CARDIOVASCULAR STRESS TEST  10-29-2011   dr Daneen Schick   Low risk scan;  mild perfusion defect seen in the basal inferoseptal, basal inferior and mid inferior regions consistent with an infarct/scar and/or overlying attenuation/  mild to moderate global  LVSF,  ef 40-45%  . DOBUTAMINE STRESS ECHO  07-23-2012   [name]   abnormal ;  at rest estimated lvef 25-30% and global severe LV hypokinesis ;  no cp during stress and achieved 85% maxium predicted heart rate;  negative stress ECG for inducible ischemia;  estimated lvef with stress 35-40%;  augmentation of wall segments consistant with cardiomyopathy and differential fibrosis  . FISTULOTOMY N/A 03/26/2015   Procedure:  FISTULOTOMY;  Surgeon: [name], MD;  Location: [facility];  Service: General;  Laterality: N/A;  . INCISION AND DRAINAGE ABSCESS N/A 03/26/2015   Procedure: ANAL INCISION AND DRAINAGE;  Surgeon: [name], MD;  Location: [place];  Service: General;  Laterality: N/A;  . RETINAL DETACHMENT SURGERY Left 2011   incomplete repair/ needs eye drops to keep pressure down  . TEE WITHOUT CARDIOVERSION N/A 10/17/2017   Procedure: TRANSESOPHAGEAL ECHOCARDIOGRAM (TEE);  Surgeon: Josue Hector, MD;  Location[facility]COPY;  Service: Cardiovascular;  Laterality: N/A;  . TRANSTHORACIC ECHOCARDIOGRAM  07-01-2014    done at [facility]   grade 1 diastolic dysfunction,  ef 45%/  trace TR and PR    Social History:  Ambulatory  cane,       reports that he has never smoked. He has never used smokeless tobacco. He reports that he does not drink alcohol and does not use drugs.      Family History:   Family History  Problem Relation Age of Onset  . Diabetes Other     Allergies: No Known Allergies   Prior to Admission medications   Medication Sig Start Date End Date Taking? Authorizing Provider  aspirin 325 MG tablet Take 325 mg by mouth every evening.   Yes [provider]  atorvastatin (LIPITOR) 10 MG tablet Take 10 mg by mouth every evening.   Yes [provider]  calcium acetate (PHOSLO) 667 MG capsule Take 1,334 mg by mouth See admin instructions. Take 2 capsules (1334 mg) by mouth twice daily - with lunch and supper 06/02/10  Yes [provider]  celecoxib (CELEBREX) 100 MG capsule Take 100 mg by mouth daily as needed (pain). 04/25/19  Yes [provider]  cycloSPORINE, PF, (CEQUA) 0.09 % SOLN Place 1 drop into both eyes 2 (two) times daily.   Yes [provider]  febuxostat (ULORIC) 40 MG tablet Take 40 mg by mouth every evening.   Yes [provider]  ferric citrate (AURYXIA) 1 GM 210  MG(Fe) tablet Take 420 mg by mouth See admin instructions. Take 2 tablets (420 mg) by mouth twice daily - with lunch and supper   Yes [provider]  gabapentin (NEURONTIN) 300 MG capsule Take 300 mg by mouth daily as needed (pain). 05/09/15  Yes [provider]  ibuprofen (ADVIL) 200 MG tablet Take 600 mg by mouth 3 (three) times daily as needed (pain).   Yes [provider]  Insulin Disposable Pump (OMNIPOD DASH 5 PACK PODS) MISC Inject into the skin. Use with Humalog 08/28/19  Yes [provider]  lidocaine (XYLOCAINE) 5 % ointment Apply 1 application topically 2 (two) times daily as needed (finger pain). 07/14/19  Yes [provider]  midodrine (PROAMATINE) 5 MG tablet Take 5 mg by mouth See admin instructions. Take one tablet (5 mg) by mouth Tuesday, Thursday, Saturday before dialysis   Yes [provider]  Multiple Vitamin (MULTIVITAMIN WITH MINERALS) TABS tablet Take 1 tablet by mouth every evening.  Yes [provider]  mupirocin ointment (BACTROBAN) 2 % APPLY TO AFFECTED AREAS OF BOTH FEET ONCE DAILY. Patient taking differently: Apply 1 application topically See admin instructions. Apply topically to bottom of feet for wound care - once daily 04/22/20  Yes McDonald, Adam R, DPM  ondansetron (ZOFRAN) 4 MG tablet Take 4 mg by mouth 3 (three) times daily as needed for nausea or vomiting. 06/27/19  Yes [provider]  Oxycodone HCl 10 MG TABS Take 10 mg by mouth 3 (three) times daily as needed (severe pain). 09/28/19  Yes [provider]  sucroferric oxyhydroxide (VELPHORO) 500 MG chewable tablet Chew 500 mg by mouth See admin instructions. Chew one tablet (500 mg) by mouth twice daily - with lunch and supper   Yes [provider]  triamcinolone cream (KENALOG) 0.1 % Apply 1 application topically daily as needed (itching). 07/14/19  Yes [provider]  Continuous Blood Gluc Sensor (FREESTYLE LIBRE 14 DAY  SENSOR) MISC See admin instructions. 04/08/20   [provider]   Physical Exam: Vitals with BMI 05/12/2020 05/12/2020 05/12/2020  Height '5\' 5"'$  - -  Weight 240 lbs 1 oz - -  BMI [id number] - -  Systolic [id number]  Diastolic 78 123XX123 74  Pulse 101 101 92     1. General:  in No  Acute distress    Chronically ill  -appearing 2. Psychological: Alert and  Oriented 3. Head/ENT:     Dry Mucous Membranes                          Head Non traumatic, neck supple                          Poor Dentition 4. SKIN:  decreased Skin turgor,  Skin clean Dry bilateral heel ulcers left worse than right with some  lower extremity swelling and warmth surrounding the ulcer on the left Bilateral fingers bandaged patient states secondary to history of Raynaud's  5. Heart: Regular rate and rhythm no Murmur, no Rub or gallop 6. Lungs:  Clear to auscultation bilaterally, no wheezes or crackles   7. Abdomen: Soft,  non-tender, Non distended  obese bowel sounds present 8. Lower extremities: no clubbing, cyanosis, trace edema 9. Neurologically Grossly intact, moving all 4 extremities equally    10. MSK: Normal range of motion   All other LABS:     Recent Labs  Lab 05/12/20 1319  WBC 7.3  NEUTROABS 5.6  HGB 10.8*  HCT 37.6*  MCV 101.6*  PLT 321     Recent Labs  Lab 05/12/20 1319  NA 139  K 5.0  CL 99  CO2 24  GLUCOSE 96  BUN 41*  CREATININE 9.56*  CALCIUM 8.7*     Recent Labs  Lab 05/12/20 1319  AST 14*  ALT 13  ALKPHOS 261*  BILITOT 0.7  PROT 6.8  ALBUMIN 2.4*       Cultures:    Component Value Date/Time   SDES  06/13/2019 1100    PENIS Performed at [facility], [place] [address] [place], [place] [address]    SPECREQUEST  06/13/2019 1100    NONE Performed at [facility], [place] [address], [place], [place] [address]    CULT RARE ENTEROCOCCUS FAECALIS 06/13/2019 1100   REPTSTATUS 06/16/2019 FINAL 06/13/2019 1100       Chart has  been reviewed  Assessment/Plan   55 y.o. male with medical history significant of ESRD on HD, DM 2, gout peripheral neuropathy, diastolic.systolic CHF   Admitted for left foot osteomyelitis secondary to diabetic foot ulcer  Present on Admission: . Osteomyelitis due to type 2 diabetes mellitus ([place]) /. Diabetic foot ulcer ([name])in the ER received a dose of vancomycin Continue Rocephin metronidazole. Check MRSA serology Await results of blood cultures as well as intraoperative cultures. Adjust antibiotics as needed  may need ID consult Obtain ABI  keep n.p.o. for possible procedure in a.m. appreciate podiatry consult  . Type II diabetes mellitus with renal manifestations ([place]) -  Order SensitiveSSI  Pt has insulin pump in place - hold off while being admitted as pt will need proceedures Patient not on basal insulin  -  check TSH and HgA1C  - Hold by mouth medications   . Secondary hyperparathyroidism ([place]) -chronic stable continue home Meds  . Secondary anemia -obtain anemia panel given somewhat worsening hemoglobin  . HYPERCHOLESTEROLEMIA -chronic stable continue statin  . Essential hypertension -dialysis controlled, have been running low needing Midodrine prior to HD   Chronic combined systolic diastolic CHF usually managed with hemodialysis.  Currently does not appear to be fluid overloaded  End-stage renal disease -HD Tuesday Thursday Saturday Due for hemodialysis tomorrow. ER provider  notified nephrology Dr. Shary Decamp  is aware     Other plan as per orders.  DVT prophylaxis:  SCD       Code Status:    Code Status: Prior   DNR/DNI  as per patient   I had personally discussed CODE STATUS with patient     Family Communication:   Family not at  Bedside   Disposition Plan:     To home once workup is complete and patient is stable   Following barriers for discharge:                            Electrolytes corrected                                                              Pain controlled with PO medications                               able to transition to PO antibiotics                             Will need to be able to tolerate PO                                                   Will need consultants to evaluate patient prior to discharge                     Would benefit from PT/OT eval prior to DC  Ordered  Diabetes care coordinator                   Transition of care consulted                   Nutrition    consulted                  Wound care  consulted                                        Consults called: Nephrology is aware, Podiatry is aware will see in AM  Admission status:  ED Disposition    ED Disposition Condition Comment   Admit  The patient appears reasonably stabilized for admission considering the current resources, flow, and capabilities available in the ED at this time, and I doubt any other [facility] requiring further screening and/or treatment in the ED prior to admission is  present.          inpatient     I Expect 2 midnight stay secondary to severity of patient's current illness need for inpatient interventions justified by the following:  Severe lab/radiological/exam abnormalities including:   MRI worrisome for osteomyelitis and extensive comorbidities including:  DM2   CHF End-stage renal disease   That are currently affecting medical management.   I expect  patient to be hospitalized for 2 midnights requiring inpatient medical care.  Patient is at high risk for adverse outcome (such as loss of life or disability) if not treated.  Indication for inpatient stay as follows:     Need for operative/procedural  intervention    Need for IV antibiotic IV pain medications,     Level of care  tele  For 12H   No results found for: SARSCOV2NAA   Precautions: admitted as  asymptomatic screening protocol   PPE: Used by the provider:   N95  eye Goggles,  Gloves      Terissa Haffey 05/12/2020, 11:58 PM    [facility]     after 2 AM please page floor coverage PA If 7AM-7PM, please contact the day team taking care of the patient using [email or web address]   Patient was evaluated in the context of the global COVID-19 pandemic, which necessitated consideration that the patient might be at risk for infection with the SARS-CoV-2 virus that causes COVID-19. Institutional protocols and algorithms that pertain to the evaluation of patients at risk for COVID-19 are in a state of rapid change based on information released by regulatory bodies including the CDC and federal and state organizations. These policies and algorithms were followed during the patient's care.

## 2020-05-12 NOTE — ED Provider Notes (Signed)
[facility] EMERGENCY DEPARTMENT Provider Note   CSN: [id number] Arrival date & time: 05/12/20  1310     History Chief Complaint  Patient presents with  . Foot Pain    [name] [name] is a 55 y.o. male history of ESRD, diabetes, hypertension, ischemic cardiomyopathy, CHF.  Patient reports resented to the ER today by his podiatrist for concern of left heel infection and foreign body.  Patient reports he has had left heel pain for around 1-2 weeks has been constant throbbing nonradiating worse with ambulation no alleviating factors.  He reports he has not had any antibiotics for this before.  He reports he had an MRI recently which showed infection and possible foreign body which is why he was sent in today.  Denies fever/chills, headache, chest pain/shortness of breath, cough, abdominal pain, nausea/vomiting or any additional concerns  HPI     Past Medical History:  Diagnosis Date  . Anal infection    posterior anal canal  . Anemia, chronic renal failure   . Atrophic kidney    BILATERAL  . DM type 2 causing ESRD [facility])    Nephrologist-- dr [name] [facility])--  on hemodialysis since June 2012 at  [name]  TTS  . Hemodialysis patient [facility])    at [place] on Tues/ Thur/Sat/schedule  . Hemorrhoids   . Hepatitis B antibody positive   . History of pleural effusion    bilateral  . Hyperparathyroidism, secondary renal ([place])   . Hypertension   . Ischemic cardiomyopathy    per echo 07-01-2014  ef 45%  . LAFB (left anterior fascicular block)   . Peripheral neuropathy   . Systolic and diastolic CHF, chronic ([place])    CARDIOLOGIST-  DR Daneen Schick ([place])  AND DR [name] ([name])    Patient Active Problem List   Diagnosis Date Noted  . 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 Diabetic foot ulcer ([place]) 05/12/2020  . HLD (hyperlipidemia) 02/08/2020  . Secondary  hyperparathyroidism ([place]) 02/08/2020  . Secondary anemia 02/08/2020  . Retinal detachment 02/08/2020  . Medication monitoring encounter 07/25/2019  . Penile mass 07/16/2019  . BMI 40.0-44.9, adult ([place]) 12/20/2018  . Wound infection 09/15/2018  . 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 Acute GI bleeding 10/13/2017  . ESRD on hemodialysis ([place]) 10/13/2017  . Embolism, arterial ([place]) 10/12/2017  . Ischemia of finger 10/02/2017  . Deceased-donor kidney transplant 06/04/2016  . 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 Diabetes mellitus type 2, uncontrolled ([place]) 05/03/2014  . Non-ischemic cardiomyopathy ([place]) 05/03/2014  . Type II diabetes mellitus with renal manifestations ([place]) 02/01/2011  . Mechanical complication of other vascular device, implant, and graft 02/01/2011  . HYPERCHOLESTEROLEMIA 08/19/2009  . Essential hypertension 08/19/2009  . Chronic systolic heart failure ([place]) 08/19/2009  . CKD (chronic kidney disease) stage V requiring chronic dialysis ([place]) 08/19/2009  . Depression 08/19/2009    Past Surgical History:  Procedure Laterality Date  . APPENDECTOMY  09-12-2004   laparotomy w/ drainage peritinitis  . AV FISTULA PLACEMENT  02-27-2010   right forearm (RADIOCEPHALIC)  . AV FISTULA REPAIR  10-30-2010  . CARDIOVASCULAR STRESS TEST  10-29-2011   dr Daneen Schick   Low risk scan;  mild perfusion defect seen in the basal inferoseptal, basal inferior and mid inferior regions consistent with an infarct/scar and/or overlying attenuation/  mild to moderate global LVSF,  ef 40-45%  . DOBUTAMINE STRESS ECHO  07-23-2012   [name]   abnormal ;  at rest estimated lvef 25-30% and global severe LV hypokinesis ;  no cp during stress and achieved 85% maxium predicted heart rate;  negative stress ECG for inducible ischemia;  estimated lvef with stress 35-40%;  augmentation of wall segments consistant with cardiomyopathy and differential  fibrosis  . FISTULOTOMY N/A 03/26/2015   Procedure: FISTULOTOMY;  Surgeon: [name], MD;  Location: [facility];  Service: General;  Laterality: N/A;  . INCISION AND DRAINAGE ABSCESS N/A 03/26/2015   Procedure: ANAL INCISION AND DRAINAGE;  Surgeon: [name], MD;  Location: [facility];  Service: General;  Laterality: N/A;  . RETINAL DETACHMENT SURGERY Left 2011   incomplete repair/ needs eye drops to keep pressure down  . TEE WITHOUT CARDIOVERSION N/A 10/17/2017   Procedure: TRANSESOPHAGEAL ECHOCARDIOGRAM (TEE);  Surgeon: Josue Hector, MD;  Location: [facility] ENDOSCOPY;  Service: Cardiovascular;  Laterality: N/A;  . TRANSTHORACIC ECHOCARDIOGRAM  07-01-2014    done at [facility]   grade 1 diastolic dysfunction,  ef 45%/  trace TR and PR       Family History  Family history unknown: Yes    Social History   Tobacco Use  . Smoking status: Never Smoker  . Smokeless tobacco: Never Used  Vaping Use  . Vaping Use: Never used  Substance Use Topics  . Alcohol use: No  . Drug use: No    Home Medications Prior to Admission medications   Medication Sig Start Date End Date Taking? Authorizing Provider  aspirin 325 MG tablet Take 325 mg by mouth every evening.   Yes [provider]  atorvastatin (LIPITOR) 10 MG tablet Take 10 mg by mouth every evening.   Yes [provider]  calcium acetate (PHOSLO) 667 MG capsule Take 1,334 mg by mouth See admin instructions. Take 2 capsules (1334 mg) by mouth twice daily - with lunch and supper 06/02/10  Yes [provider]  celecoxib (CELEBREX) 100 MG capsule Take 100 mg by mouth daily as needed (pain). 04/25/19  Yes [provider]  cycloSPORINE, PF, (CEQUA) 0.09 % SOLN Place 1 drop into both eyes 2 (two) times daily.   Yes [provider]  febuxostat (ULORIC) 40 MG tablet Take 40 mg by mouth every evening.   Yes [provider]  ferric citrate (AURYXIA) 1 GM  210 MG(Fe) tablet Take 420 mg by mouth See admin instructions. Take 2 tablets (420 mg) by mouth twice daily - with lunch and supper   Yes [provider]  gabapentin (NEURONTIN) 300 MG capsule Take 300 mg by mouth daily as needed (pain). 05/09/15  Yes [provider]  ibuprofen (ADVIL) 200 MG tablet Take 600 mg by mouth 3 (three) times daily as needed (pain).   Yes [provider]  Insulin Disposable Pump (OMNIPOD DASH 5 PACK PODS) MISC Inject into the skin. Use with Humalog 08/28/19  Yes [provider]  lidocaine (XYLOCAINE) 5 % ointment Apply 1 application topically 2 (two) times daily as needed (finger pain). 07/14/19  Yes [provider]  midodrine (PROAMATINE) 5 MG tablet Take 5 mg by mouth See admin instructions. Take one tablet (5 mg) by mouth Tuesday, Thursday, Saturday before dialysis   Yes [provider]  Multiple Vitamin (MULTIVITAMIN WITH MINERALS) TABS tablet Take 1 tablet by mouth every evening.   Yes [provider]  mupirocin ointment (BACTROBAN) 2 % APPLY  TO AFFECTED AREAS OF BOTH FEET ONCE DAILY. Patient taking differently: Apply 1 application topically See admin instructions. Apply topically to bottom of feet for wound care - once daily 04/22/20  Yes McDonald, Adam R, DPM  ondansetron (ZOFRAN) 4 MG tablet Take 4 mg by mouth 3 (three) times daily as needed for nausea or vomiting. 06/27/19  Yes [provider]  Oxycodone HCl 10 MG TABS Take 10 mg by mouth 3 (three) times daily as needed (severe pain). 09/28/19  Yes [provider]  sucroferric oxyhydroxide (VELPHORO) 500 MG chewable tablet Chew 500 mg by mouth See admin instructions. Chew one tablet (500 mg) by mouth twice daily - with lunch and supper   Yes [provider]  triamcinolone cream (KENALOG) 0.1 % Apply 1 application topically daily as needed (itching). 07/14/19  Yes [provider]  Continuous Blood Gluc Sensor (FREESTYLE LIBRE 14 DAY  SENSOR) MISC See admin instructions. 04/08/20   [provider]    Allergies    Patient has no known allergies.  Review of Systems   Review of Systems Ten systems are reviewed and are negative for acute change except as noted in the HPI  Physical Exam Updated Vital Signs BP 140/78   Pulse (!) 101   Temp 98 F (36.7 C)   Resp 16   Ht $R'5\' 5"'IE$  (1.651 m)   Wt 108.9 kg   SpO2 95%   BMI 39.95 kg/m   Physical Exam Constitutional:      General: He is not in acute distress.    Appearance: Normal appearance. He is well-developed. He is not ill-appearing or diaphoretic.  HENT:     Head: Normocephalic and atraumatic.  Eyes:     General: Vision grossly intact. Gaze aligned appropriately.     Pupils: Pupils are equal, round, and reactive to light.  Neck:     Trachea: Trachea and phonation normal.  Cardiovascular:     Pulses:          Dorsalis pedis pulses are detected w/ Doppler on the right side and detected w/ Doppler on the left side.       Posterior tibial pulses are detected w/ Doppler on the right side and detected w/ Doppler on the left side.  Pulmonary:     Effort: Pulmonary effort is normal. No respiratory distress.  Abdominal:     General: There is no distension.     Palpations: Abdomen is soft.     Tenderness: There is no abdominal tenderness. There is no guarding or rebound.  Musculoskeletal:        General: Normal range of motion.     Cervical back: Normal range of motion.       Feet:  Feet:     Comments: Left heel wound, no active drainage or bleeding.  Please see pictures from podiatrist earlier today. Skin:    General: Skin is warm and dry.  Neurological:     Mental Status: He is alert.     GCS: GCS eye subscore is 4. GCS verbal subscore is 5. GCS motor subscore is 6.     Comments: Speech is clear and goal oriented, follows commands Major Cranial nerves without deficit, no facial droop Moves extremities without ataxia, coordination intact   Psychiatric:        Behavior: Behavior normal.     ED Results / Procedures / Treatments   Labs (all labs ordered are listed, but only abnormal results are displayed) Labs Reviewed  LACTIC ACID,  PLASMA - Abnormal; Notable for the following components:      Result Value   Lactic Acid, Venous 2.0 (*)    All other components within normal limits  COMPREHENSIVE METABOLIC PANEL - Abnormal; Notable for the following components:   BUN 41 (*)    Creatinine, Ser 9.56 (*)    Calcium 8.7 (*)    Albumin 2.4 (*)    AST 14 (*)    Alkaline Phosphatase 261 (*)    GFR, Estimated 6 (*)    Anion gap 16 (*)    All other components within normal limits  CBC WITH DIFFERENTIAL/PLATELET - Abnormal; Notable for the following components:   RBC 3.70 (*)    Hemoglobin 10.8 (*)    HCT 37.6 (*)    MCV 101.6 (*)    MCHC 28.7 (*)    RDW 17.6 (*)    All other components within normal limits  CULTURE, BLOOD (ROUTINE X 2)  CULTURE, BLOOD (ROUTINE X 2)  SARS CORONAVIRUS 2 (TAT 6-24 HRS)  MRSA PCR SCREENING  LACTIC ACID, PLASMA  URINALYSIS, ROUTINE W REFLEX MICROSCOPIC  HEMOGLOBIN A1C  HEMOGLOBIN A1C  HIV ANTIBODY (ROUTINE TESTING W REFLEX)  SEDIMENTATION RATE  C-REACTIVE PROTEIN  PREALBUMIN  HIV ANTIBODY (ROUTINE TESTING W REFLEX)  MAGNESIUM  PHOSPHORUS  CBC WITH DIFFERENTIAL/PLATELET  TSH  COMPREHENSIVE METABOLIC PANEL  VITAMIN Z56  FOLATE  IRON AND TIBC  FERRITIN  RETICULOCYTES    EKG None  Radiology No results found.  Procedures Procedures   Medications Ordered in ED Medications  vancomycin (VANCOREADY) IVPB 2000 mg/400 mL (2,000 mg Intravenous New Bag/Given 05/12/20 2201)  vancomycin (VANCOCIN) IVPB 1000 mg/200 mL premix (has no administration in time range)  insulin aspart (novoLOG) injection 0-6 Units (has no administration in time range)  acetaminophen (TYLENOL) tablet 650 mg (has no administration in time range)    Or  acetaminophen (TYLENOL) suppository 650 mg (has no  administration in time range)  HYDROcodone-acetaminophen (NORCO/VICODIN) 5-325 MG per tablet 1-2 tablet (has no administration in time range)  cefTRIAXone (ROCEPHIN) 2 g in sodium chloride 0.9 % 100 mL IVPB (has no administration in time range)    And  metroNIDAZOLE (FLAGYL) IVPB 500 mg (has no administration in time range)  sodium chloride flush (NS) 0.9 % injection 3 mL (3 mLs Intravenous Not Given 05/12/20 2212)  sodium chloride flush (NS) 0.9 % injection 3 mL (has no administration in time range)  0.9 %  sodium chloride infusion (has no administration in time range)  cefTRIAXone (ROCEPHIN) 2 g in sodium chloride 0.9 % 100 mL IVPB (0 g Intravenous Stopped 05/12/20 2155)    ED Course  I have reviewed the triage vital signs and the nursing notes.  Pertinent labs & imaging results that were available during my care of the patient were reviewed by me and considered in my medical decision making (see chart for details).    MDM Rules/Calculators/A&P                         Additional history obtained from: 1. Nursing notes from this visit. 2. Review of electronic medical records.  Reviewed podiatry note from today, patient was seen by Dr. Jacqualyn Posey, patient was recommended to come to the ER for admission for possible osteomyelitis of the heel and IV antibiotics, arterial studies and possible surgical intervention. ------------------------ I reviewed and interpreted labs which include: CMP shows elevated creatinine and BUN consistent with ESRD, potassium within normal limits,  no emergent lecture derangement.  Slight elevation of alk phos. CBC without leukocytosis, hemoglobin of 10.8. Lactate slightly elevated at 2.0   I ordered patient vancomycin and Rocephin for treatment of possible osteomyelitis.  Consult with Dr. Jacqualyn Posey, podiatry at 8:50 PM, agrees with medicine admission and he will be by to see patient tomorrow, asked for arterial studies performed. - Patient reassessed he is resting  comfortably in bed no acute distress vital signs stable, no other complaints today, he is agreeable for admission - 9:55 PM: Consult with Dr. Roel Cluck, patient accepted to hospitalist service. - 9:59 PM: Consult with nephrologist Dr. Marval Regal, advises he will plan for patient's dialysis tomorrow  Patient seen and evaluated by Dr. Ayesha Rumpf during this visit who agrees with work-up and admission.   Note: Portions of this report may have been transcribed using voice recognition software. Every effort was made to ensure accuracy; however, inadvertent computerized transcription errors may still be present. Final Clinical Impression(s) / ED Diagnoses Final diagnoses:  Left foot infection  Acute osteomyelitis of left ankle or foot [facility])    Rx / DC Orders ED Discharge Orders    None       Gari Crown 05/12/20 2240    Quintella Reichert, MD 05/13/20 1452

## 2020-05-12 NOTE — ED Notes (Addendum)
Patient provided with bagged sandwich meal, OJ, and graham crackers for dinner. Patient AAOx4.

## 2020-05-12 NOTE — ED Notes (Signed)
Lab attempted blood draw x 3. Unable to obtain second set of cultures.

## 2020-05-12 NOTE — Progress Notes (Addendum)
Subjective:  Patient ID: [name] [name], male    DOB: 1965-12-14,  MRN: [id number]  55 y.o. male presents with at risk foot care with h/o NIDDM with ESRD on hemodialysis and painful thick toenails that are difficult to trim. Pain interferes with ambulation. Aggravating factors include wearing enclosed shoe gear. Pain is relieved with periodic professional debridement.   Today, he has c/o painful plantar heel wound left >right. His mother has been cleaning his wounds daily and applying Mupirocin Ointment. Left foot has become more painful in the past 1-2 weeks. He denies any fever, chills, night sweats, dizziness, fatigue, nausea or vomiting.   He did have MRI study on left LE performed on 02/16.2022.       Patient also admits both thighs and legs have become more painful as well.   Review of Systems: Negative except as noted in the HPI.    Current Outpatient Medications:  .  Continuous Blood Gluc Sensor (FREESTYLE LIBRE 14 DAY SENSOR) MISC, See admin instructions., Disp: , Rfl:  .  acetaminophen (TYLENOL) 500 MG tablet, Take 1,000 mg by mouth as needed., Disp: , Rfl:  .  allopurinol (ZYLOPRIM) 100 MG tablet, Take 100 mg by mouth daily., Disp: , Rfl:  .  aspirin 325 MG tablet, Take 325 mg by mouth daily., Disp: , Rfl:  .  calcium acetate (PHOSLO) 667 MG capsule, Take 2,001 mg by mouth 3 (three) times daily with meals. , Disp: , Rfl:  .  celecoxib (CELEBREX) 100 MG capsule, Take 100 mg by mouth daily., Disp: , Rfl:  .  CEQUA 0.09 % SOLN, Apply 1 drop to eye 2 (two) times daily., Disp: , Rfl:  .  cyanocobalamin 1000 MCG tablet, Take by mouth., Disp: , Rfl:  .  febuxostat (ULORIC) 40 MG tablet, Take 40 mg by mouth daily. , Disp: , Rfl:  .  ferric citrate (AURYXIA) 1 GM 210 MG(Fe) tablet, Take 630 mg by mouth 3 (three) times daily. , Disp: , Rfl:  .  gabapentin (NEURONTIN) 300 MG capsule, Take 600 mg by mouth as needed. For pain, Disp: , Rfl: 3 .  HUMALOG 100 UNIT/ML injection, 50U/DAY  VIA PUMP SUBCUTANEOUSLY, Disp: , Rfl:  .  Insulin Disposable Pump (OMNIPOD DASH 5 PACK PODS) MISC, Inject into the skin., Disp: , Rfl:  .  lidocaine (XYLOCAINE) 5 % ointment, APPLY TOPICALLY THREE TIMES DAILY FOR PAIN OF FINGERS, Disp: , Rfl:  .  midodrine (PROAMATINE) 5 MG tablet, Take 5 mg by mouth 3 (three) times a week. Every Tuesday Thursday and Saturday, Disp: , Rfl:  .  Multiple Vitamins-Minerals (MEGA MULTIVITAMIN FOR MEN PO), Take 1 tablet by mouth daily. , Disp: , Rfl:  .  mupirocin ointment (BACTROBAN) 2 %, APPLY TO AFFECTED AREAS OF BOTH FEET ONCE DAILY., Disp: 22 g, Rfl: 1 .  NON FORMULARY, humalog omnipod three times daily adjusted to food intake, Disp: , Rfl:  .  ondansetron (ZOFRAN) 4 MG tablet, Take 4 mg by mouth 3 (three) times daily as needed., Disp: , Rfl:  .  Oxycodone HCl 10 MG TABS, Take 10 mg by mouth 3 (three) times daily., Disp: , Rfl:  .  sucroferric oxyhydroxide (VELPHORO) 500 MG chewable tablet, Chew 500 mg by mouth 2 (two) times daily., Disp: , Rfl:  .  triamcinolone cream (KENALOG) 0.1 %, APPLY TO AFFECTED AREAS OF TRUNK AND EXTREMITIES TWICE A DAY, Disp: , Rfl:    No Known Allergies  Objective:  There were no vitals filed  for this visit. Constitutional Patient is a pleasant 55 y.o. African American male in NAD. AAO x 3.  Vascular Capillary fill time to digits <3 seconds b/l lower extremities. Diminished pedal pulses b/l. Pedal hair absent. Lower extremity skin temperature gradient warm to cool. No pain with calf compression b/l. Ischemia noted to left foot and right foot.  Neurologic Normal speech. Protective sensation intact 5/5 intact bilaterally with 10g monofilament b/l.  Dermatologic Pedal skin with normal turgor, texture and tone bilaterally. Toenails 1-5 b/l elongated, discolored, dystrophic, thickened, crumbly with subungual debris and tenderness to dorsal palpation. Ischemic wounds: left heel measures 2.5 x 4.0 x 0.2 cm with eschar, + odor, +tenderness to  palpation, +serous drainage; right foot ulcer plantar heel measures 1.0 x 2.0 x 0.2 cm with eschar, +tenderness to palpation, no drainage, no odor.  Orthopedic: Normal muscle strength 5/5 to all lower extremity muscle groups bilaterally. Pes planus deformity noted b/l.    MR HEEL LEFT W WO CONTRAST  Result Date: 05/07/2020 CLINICAL DATA:  Penetrating foot trauma. Foreign body suspected. Diabetic heel ulcer with pain and swelling for 1 month EXAM: MRI OF LOWER LEFT EXTREMITY WITHOUT AND WITH CONTRAST TECHNIQUE: Multiplanar, multisequence MR imaging of the left hindfoot was performed both before and after administration of intravenous contrast. CONTRAST:  88m GADAVIST GADOBUTROL 1 MMOL/ML IV SOLN COMPARISON:  Radiographs 04/24/2018. No recent radiographs available FINDINGS: TENDONS Peroneal: Intact and normally positioned. Posteromedial: Intact and normally positioned. Anterior: Intact and normally positioned. Achilles: Intact. Plantar Fascia: There is tendinosis and possible partial tearing at the calcaneal attachment the medial cord. LIGAMENTS Lateral: The anterior and posterior talofibular and calcaneofibular ligaments are intact. Medial: The deltoid and visualized portions of the spring ligament appear intact. CARTILAGE AND BONES Ankle Joint: The talar dome and tibial plafond are intact. No significant ankle joint effusion or suspicious synovial enhancement. Subtalar Joints/Sinus Tarsi: Unremarkable. Bones: There is low-level marrow T2 hyperintensity and enhancement within the plantar aspect of the calcaneal tuberosity near the attachment of the medial cord of the plantar fascia. No cortical destruction or other significant osseous findings are seen. Other: Skin irregularity along the plantar aspect of the hindfoot, superficial to the insertion of the medial cord of the plantar fascia, presumed to reflect the patient's wound. Within the underlying subcutaneous fat, there is T2 hyperintensity and enhancement  consistent with inflammation. There is no focal fluid collection. There is a tiny linear focus of low signal on all pulse sequences (best seen on the axial images) in this area which could reflect gas or a small foreign body. No drainable fluid collection. IMPRESSION: 1. Presumed wound along the plantar aspect of the hindfoot, superficial to the insertion of the medial cord of the plantar fascia. Inflammatory changes in the underlying subcutaneous fat with possible tiny foreign body or soft tissue emphysema. No drainable fluid collection. 2. Mild marrow T2 hyperintensity and enhancement within the plantar aspect of the calcaneal tuberosity near the attachment of the medial cord of the plantar fascia, likely reactive. Given proximity to the presumed wound, early osteomyelitis difficult to exclude, although no cortical destruction identified. 3. Tendinosis and possible partial tearing of the medial cord of the plantar fascia. 4. No other significant findings. Electronically Signed   By: WRichardean SaleM.D.   On: 05/07/2020 16:15     Assessment:   1. Pain due to onychomycosis of toenails of both feet   2. Cellulitis and abscess of foot   3. Critical ischemia of foot ([place]  4. ESRD on hemodialysis ([place])   5. Uncontrolled type 2 diabetes mellitus with hyperglycemia ([place])    Plan:  Patient was evaluated and treated and all questions answered.  Onychomycosis with pain -Nails palliatively debridement as below. -Educated on self-care  Procedure: Nail Debridement Rationale: Pain Type of Debridement: manual, sharp debridement. Instrumentation: Nail nipper, rotary burr. Number of Nails: 10  -Examined patient. -Patient also evaluated by Dr. Celesta Gentile for evaluation of wounds. -Toenails 1-5 b/l were debrided in length and girth with sterile nail nippers and dremel without iatrogenic bleeding.   Return in about 3 months (around 08/09/2020) for nail care.  Marzetta Board, DPM   Given the  ulcer and increased pain I have recommend the patient to go to the ER for further evaluation and likely admission given possible osteomyelitis in the heel. Will need IV antibiotics, arterial studies and possible surgical intervention. We discussed wound debridement and bone biopsy. If admitted, podiatry will follow.   Celesta Gentile, DPM

## 2020-05-12 NOTE — Progress Notes (Signed)
Pharmacy Antibiotic Note  [name] [name] is a 55 y.o. male admitted on 05/12/2020 with Osteomyelitis.  Pharmacy has been consulted for Vancomycin dosing.   Height: '5\' 5"'$  (165.1 cm) Weight: 108.9 kg (240 lb 1.3 oz) IBW/kg (Calculated) : 61.5  Temp (24hrs), Avg:98.3 F (36.8 C), Min:98 F (36.7 C), Max:98.6 F (37 C)  Recent Labs  Lab 05/12/20 1319  WBC 7.3  CREATININE 9.56*  LATICACIDVEN 2.0*    Estimated Creatinine Clearance: 10.1 mL/min (A) (by C-G formula based on SCr of 9.56 mg/dL (H)).    No Known Allergies  Antimicrobials this admission: 2/21 Ceftriaxone >>  2/21 Vancomycin >>   Dose adjustments this admission: N/a  Microbiology results: Pending   Plan:  - Vancomycin '2000mg'$  IV x 1 dose  - Followed by Vancomycin '1000mg'$  IV qTTS post HD - Monitor patients HD Sessions and urine output  - De-escalate ABX when appropriate   Thank you for allowing pharmacy to be a part of this patient's care.  Duanne Limerick PharmD. BCPS 05/12/2020 8:21 PM

## 2020-05-13 ENCOUNTER — Encounter (HOSPITAL_COMMUNITY): Payer: Self-pay | Admitting: Internal Medicine

## 2020-05-13 ENCOUNTER — Inpatient Hospital Stay (HOSPITAL_COMMUNITY): Payer: Medicare Other

## 2020-05-13 ENCOUNTER — Encounter (HOSPITAL_COMMUNITY): Payer: Medicare Other

## 2020-05-13 DIAGNOSIS — L97529 Non-pressure chronic ulcer of other part of left foot with unspecified severity: Secondary | ICD-10-CM

## 2020-05-13 DIAGNOSIS — E11621 Type 2 diabetes mellitus with foot ulcer: Secondary | ICD-10-CM

## 2020-05-13 DIAGNOSIS — I739 Peripheral vascular disease, unspecified: Secondary | ICD-10-CM

## 2020-05-13 DIAGNOSIS — L97524 Non-pressure chronic ulcer of other part of left foot with necrosis of bone: Secondary | ICD-10-CM

## 2020-05-13 DIAGNOSIS — E1165 Type 2 diabetes mellitus with hyperglycemia: Secondary | ICD-10-CM

## 2020-05-13 LAB — PHOSPHORUS: Phosphorus: 7.6 mg/dL — ABNORMAL HIGH (ref 2.5–4.6)

## 2020-05-13 LAB — RETICULOCYTES
Immature Retic Fract: 34.2 % — ABNORMAL HIGH (ref 2.3–15.9)
RBC.: 3.52 MIL/uL — ABNORMAL LOW (ref 4.22–5.81)
Retic Count, Absolute: 69.3 10*3/uL (ref 19.0–186.0)
Retic Ct Pct: 2 % (ref 0.4–3.1)

## 2020-05-13 LAB — CBC WITH DIFFERENTIAL/PLATELET
Abs Immature Granulocytes: 0.04 10*3/uL (ref 0.00–0.07)
Basophils Absolute: 0.1 10*3/uL (ref 0.0–0.1)
Basophils Relative: 1 %
Eosinophils Absolute: 0.2 10*3/uL (ref 0.0–0.5)
Eosinophils Relative: 3 %
HCT: 34.3 % — ABNORMAL LOW (ref 39.0–52.0)
Hemoglobin: 10.4 g/dL — ABNORMAL LOW (ref 13.0–17.0)
Immature Granulocytes: 1 %
Lymphocytes Relative: 9 %
Lymphs Abs: 0.7 10*3/uL (ref 0.7–4.0)
MCH: 29.5 pg (ref 26.0–34.0)
MCHC: 30.3 g/dL (ref 30.0–36.0)
MCV: 97.4 fL (ref 80.0–100.0)
Monocytes Absolute: 0.8 10*3/uL (ref 0.1–1.0)
Monocytes Relative: 11 %
Neutro Abs: 5.8 10*3/uL (ref 1.7–7.7)
Neutrophils Relative %: 75 %
Platelets: 353 10*3/uL (ref 150–400)
RBC: 3.52 MIL/uL — ABNORMAL LOW (ref 4.22–5.81)
RDW: 17.7 % — ABNORMAL HIGH (ref 11.5–15.5)
WBC: 7.6 10*3/uL (ref 4.0–10.5)
nRBC: 0 % (ref 0.0–0.2)

## 2020-05-13 LAB — SEDIMENTATION RATE: Sed Rate: 63 mm/hr — ABNORMAL HIGH (ref 0–16)

## 2020-05-13 LAB — C-REACTIVE PROTEIN: CRP: 13.5 mg/dL — ABNORMAL HIGH (ref <1.0)

## 2020-05-13 LAB — COMPREHENSIVE METABOLIC PANEL
ALT: 13 U/L (ref 0–44)
AST: 13 U/L — ABNORMAL LOW (ref 15–41)
Albumin: 2.2 g/dL — ABNORMAL LOW (ref 3.5–5.0)
Alkaline Phosphatase: 215 U/L — ABNORMAL HIGH (ref 38–126)
Anion gap: 15 (ref 5–15)
BUN: 45 mg/dL — ABNORMAL HIGH (ref 6–20)
CO2: 23 mmol/L (ref 22–32)
Calcium: 8.5 mg/dL — ABNORMAL LOW (ref 8.9–10.3)
Chloride: 98 mmol/L (ref 98–111)
Creatinine, Ser: 10.13 mg/dL — ABNORMAL HIGH (ref 0.61–1.24)
GFR, Estimated: 6 mL/min — ABNORMAL LOW (ref >60)
Glucose, Bld: 112 mg/dL — ABNORMAL HIGH (ref 70–99)
Potassium: 5.2 mmol/L — ABNORMAL HIGH (ref 3.5–5.1)
Sodium: 136 mmol/L (ref 135–145)
Total Bilirubin: 0.8 mg/dL (ref 0.3–1.2)
Total Protein: 6.6 g/dL (ref 6.5–8.1)

## 2020-05-13 LAB — VITAMIN B12: Vitamin B-12: 245 pg/mL (ref 180–914)

## 2020-05-13 LAB — IRON AND TIBC
Iron: 20 ug/dL — ABNORMAL LOW (ref 45–182)
Saturation Ratios: 12 % — ABNORMAL LOW (ref 17.9–39.5)
TIBC: 167 ug/dL — ABNORMAL LOW (ref 250–450)
UIBC: 147 ug/dL

## 2020-05-13 LAB — PREALBUMIN: Prealbumin: 7.3 mg/dL — ABNORMAL LOW (ref 18–38)

## 2020-05-13 LAB — HEPATITIS B SURFACE ANTIBODY,QUALITATIVE: Hep B S Ab: NONREACTIVE

## 2020-05-13 LAB — HEPATITIS B SURFACE ANTIGEN: Hepatitis B Surface Ag: NONREACTIVE

## 2020-05-13 LAB — CBG MONITORING, ED: Glucose-Capillary: 98 mg/dL (ref 70–99)

## 2020-05-13 LAB — SARS CORONAVIRUS 2 (TAT 6-24 HRS): SARS Coronavirus 2: NEGATIVE

## 2020-05-13 LAB — GLUCOSE, CAPILLARY
Glucose-Capillary: 104 mg/dL — ABNORMAL HIGH (ref 70–99)
Glucose-Capillary: 117 mg/dL — ABNORMAL HIGH (ref 70–99)
Glucose-Capillary: 161 mg/dL — ABNORMAL HIGH (ref 70–99)

## 2020-05-13 LAB — HEMOGLOBIN A1C
Hgb A1c MFr Bld: 7.6 % — ABNORMAL HIGH (ref 4.8–5.6)
Mean Plasma Glucose: 171.42 mg/dL

## 2020-05-13 LAB — HEPATITIS B CORE ANTIBODY, TOTAL: Hep B Core Total Ab: NONREACTIVE

## 2020-05-13 LAB — MAGNESIUM: Magnesium: 2.3 mg/dL (ref 1.7–2.4)

## 2020-05-13 LAB — FERRITIN: Ferritin: 62 ng/mL (ref 24–336)

## 2020-05-13 LAB — HIV ANTIBODY (ROUTINE TESTING W REFLEX): HIV Screen 4th Generation wRfx: NONREACTIVE

## 2020-05-13 LAB — TSH: TSH: 0.953 u[IU]/mL (ref 0.350–4.500)

## 2020-05-13 LAB — FOLATE: Folate: 8 ng/mL (ref >5.9)

## 2020-05-13 MED ORDER — PENTAFLUOROPROP-TETRAFLUOROETH EX AERO
1.0000 | INHALATION_SPRAY | CUTANEOUS | Status: DC | PRN
Start: 2020-05-13 — End: 2020-05-13

## 2020-05-13 MED ORDER — SODIUM CHLORIDE 0.9 % IV SOLN: 100.0000 mL | INTRAVENOUS | Status: DC | PRN

## 2020-05-13 MED ORDER — RENA-VITE PO TABS
1.0000 | ORAL_TABLET | Freq: Every day | ORAL | Status: DC
  Administered 2020-05-13 – 2020-05-15 (×3): 1 via ORAL
  Filled 2020-05-13 (×3): qty 1

## 2020-05-13 MED ORDER — FERRIC CITRATE 1 GM 210 MG(FE) PO TABS
420.0000 mg | ORAL_TABLET | Freq: Two times a day (BID) | ORAL | Status: DC
  Administered 2020-05-13 – 2020-05-15 (×3): 420 mg via ORAL
  Filled 2020-05-13 (×8): qty 2

## 2020-05-13 MED ORDER — HEPARIN SODIUM (PORCINE) 1000 UNIT/ML DIALYSIS
1000.0000 [IU] | INTRAMUSCULAR | Status: DC | PRN
Start: 2020-05-13 — End: 2020-05-13

## 2020-05-13 MED ORDER — CHLORHEXIDINE GLUCONATE CLOTH 2 % EX PADS
6.0000 | MEDICATED_PAD | Freq: Once | CUTANEOUS | Status: AC
  Administered 2020-05-14: 6 via TOPICAL

## 2020-05-13 MED ORDER — LIDOCAINE-PRILOCAINE 2.5-2.5 % EX CREA: 1.0000 "application " | TOPICAL_CREAM | CUTANEOUS | Status: DC | PRN

## 2020-05-13 MED ORDER — JUVEN PO PACK
1.0000 | PACK | Freq: Two times a day (BID) | ORAL | Status: DC
  Administered 2020-05-13: 1 via ORAL
  Filled 2020-05-13 (×2): qty 1

## 2020-05-13 MED ORDER — CALCIUM ACETATE (PHOS BINDER) 667 MG PO CAPS
1334.0000 mg | ORAL_CAPSULE | Freq: Two times a day (BID) | ORAL | Status: DC
  Administered 2020-05-13: 1334 mg via ORAL
  Filled 2020-05-13 (×4): qty 2

## 2020-05-13 MED ORDER — CHLORHEXIDINE GLUCONATE CLOTH 2 % EX PADS
6.0000 | MEDICATED_PAD | Freq: Every day | CUTANEOUS | Status: DC
  Administered 2020-05-14 – 2020-05-16 (×2): 6 via TOPICAL

## 2020-05-13 MED ORDER — ATORVASTATIN CALCIUM 10 MG PO TABS
10.0000 mg | ORAL_TABLET | Freq: Every evening | ORAL | Status: DC
  Administered 2020-05-13 – 2020-05-15 (×2): 10 mg via ORAL
  Filled 2020-05-13 (×2): qty 1

## 2020-05-13 MED ORDER — FEBUXOSTAT 40 MG PO TABS
40.0000 mg | ORAL_TABLET | Freq: Every evening | ORAL | Status: DC
  Administered 2020-05-13 – 2020-05-15 (×2): 40 mg via ORAL
  Filled 2020-05-13 (×4): qty 1

## 2020-05-13 MED ORDER — GABAPENTIN 300 MG PO CAPS: 300.0000 mg | ORAL_CAPSULE | Freq: Every day | ORAL | Status: DC | PRN

## 2020-05-13 MED ORDER — LIDOCAINE HCL (PF) 1 % IJ SOLN: 5.0000 mL | INTRAMUSCULAR | Status: DC | PRN

## 2020-05-13 MED ORDER — PROSOURCE PLUS PO LIQD
30.0000 mL | Freq: Three times a day (TID) | ORAL | Status: DC
  Administered 2020-05-13: 30 mL via ORAL
  Filled 2020-05-13 (×3): qty 30

## 2020-05-13 MED ORDER — ONDANSETRON HCL 4 MG PO TABS
4.0000 mg | ORAL_TABLET | Freq: Three times a day (TID) | ORAL | Status: DC | PRN
  Administered 2020-05-13 – 2020-05-16 (×3): 4 mg via ORAL
  Filled 2020-05-13 (×3): qty 1

## 2020-05-13 MED ORDER — CYCLOSPORINE 0.05 % OP EMUL
1.0000 [drp] | Freq: Two times a day (BID) | OPHTHALMIC | Status: DC
  Filled 2020-05-13 (×8): qty 1

## 2020-05-13 MED ORDER — OXYCODONE HCL 5 MG PO TABS
10.0000 mg | ORAL_TABLET | Freq: Three times a day (TID) | ORAL | Status: DC | PRN
  Administered 2020-05-15 – 2020-05-16 (×2): 10 mg via ORAL
  Filled 2020-05-13 (×2): qty 2

## 2020-05-13 MED ORDER — SUCROFERRIC OXYHYDROXIDE 500 MG PO CHEW
500.0000 mg | CHEWABLE_TABLET | Freq: Two times a day (BID) | ORAL | Status: DC
  Administered 2020-05-13: 500 mg via ORAL
  Filled 2020-05-13 (×4): qty 1

## 2020-05-13 MED ORDER — VANCOMYCIN HCL IN DEXTROSE 1-5 GM/200ML-% IV SOLN
INTRAVENOUS | Status: AC
  Administered 2020-05-13: 1000 mg via INTRAVENOUS
  Filled 2020-05-13: qty 200

## 2020-05-13 MED ORDER — MIDODRINE HCL 5 MG PO TABS
5.0000 mg | ORAL_TABLET | ORAL | Status: DC
  Filled 2020-05-13: qty 1

## 2020-05-13 MED ORDER — ALTEPLASE 2 MG IJ SOLR: 2.0000 mg | Freq: Once | INTRAMUSCULAR | Status: DC | PRN

## 2020-05-13 NOTE — Consult Note (Addendum)
Reason for Consult: Heel ulcers, left heel osteomyelitis Referring Physician: Dr. Barb Merino, MD  [name] is an 55 y.o. male.  HPI: 55 year old male was admitted to the hospital from clinic yesterday for ulceration, concern for osteomyelitis.  He has been dealing with a wound on his left heel that seems like for the last several months.  This apparently started after he had a pedicure and the wound is continued.  He is known to Dr. Sherryle Lis has been treating this wound.  He is having some chills today and some nausea since he had tender but denies any chest pain or shortness of breath he denies any fevers. Past Medical History:  Diagnosis Date  . Anal infection    posterior anal canal  . Anemia, chronic renal failure   . Atrophic kidney    BILATERAL  . DM type 2 causing ESRD [facility])    Nephrologist-- dr [name] [facility])--  on hemodialysis since June 2012 at  [name]  TTS  . Hemodialysis patient [facility])    at [facility] on Tues/ Thur/Sat/schedule  . Hemorrhoids   . Hepatitis B antibody positive   . History of pleural effusion    bilateral  . Hyperparathyroidism, secondary renal ([place])   . Hypertension   . Ischemic cardiomyopathy    per echo 07-01-2014  ef 45%  . LAFB (left anterior fascicular block)   . Peripheral neuropathy   . Systolic and diastolic CHF, chronic ([place])    CARDIOLOGIST-  DR Daneen Schick ([place])  AND DR [name] ([name])    Past Surgical History:  Procedure Laterality Date  . APPENDECTOMY  09-12-2004   laparotomy w/ drainage peritinitis  . AV FISTULA PLACEMENT  02-27-2010   right forearm (RADIOCEPHALIC)  . AV FISTULA REPAIR  10-30-2010  . CARDIOVASCULAR STRESS TEST  10-29-2011   dr Daneen Schick   Low risk scan;  mild perfusion defect seen in the basal inferoseptal, basal inferior and mid inferior regions consistent with an infarct/scar and/or overlying attenuation/  mild to moderate global LVSF,   ef 40-45%  . DOBUTAMINE STRESS ECHO  07-23-2012   [name]   abnormal ;  at rest estimated lvef 25-30% and global severe LV hypokinesis ;  no cp during stress and achieved 85% maxium predicted heart rate;  negative stress ECG for inducible ischemia;  estimated lvef with stress 35-40%;  augmentation of wall segments consistant with cardiomyopathy and differential fibrosis  . FISTULOTOMY N/A 03/26/2015   Procedure: FISTULOTOMY;  Surgeon: [name], MD;  Location: [facility];  Service: General;  Laterality: N/A;  . INCISION AND DRAINAGE ABSCESS N/A 03/26/2015   Procedure: ANAL INCISION AND DRAINAGE;  Surgeon: [name], MD;  Location: [place];  Service: General;  Laterality: N/A;  . RETINAL DETACHMENT SURGERY Left 2011   incomplete repair/ needs eye drops to keep pressure down  . TEE WITHOUT CARDIOVERSION N/A 10/17/2017   Procedure: TRANSESOPHAGEAL ECHOCARDIOGRAM (TEE);  Surgeon: Josue Hector, MD;  Location: [facility] ENDOSCOPY;  Service: Cardiovascular;  Laterality: N/A;  . TRANSTHORACIC ECHOCARDIOGRAM  07-01-2014    done at [facility]   grade 1 diastolic dysfunction,  ef 45%/  trace TR and PR    Family History  Problem Relation Age of Onset  . Diabetes Other     Social History:  reports that he has never smoked. He has never used smokeless tobacco. He reports that he does not drink alcohol and  does not use drugs.  Allergies: No Known Allergies  Medications: I have reviewed the patient's current medications.  Results for orders placed or performed during the hospital encounter of 05/12/20 (from the past 48 hour(s))  Lactic acid, plasma     Status: Abnormal   Collection Time: 05/12/20  1:19 PM  Result Value Ref Range   Lactic Acid, Venous 2.0 (HH) 0.5 - 1.9 mmol/L    Comment: CRITICAL RESULT CALLED TO, READ BACK BY AND VERIFIED WITH: EASLEY,J RN '@[id number]'$  ON [id number] BY FLEMINGS Performed at [facility], [address] [place],  [place] [address]   Comprehensive metabolic panel     Status: Abnormal   Collection Time: 05/12/20  1:19 PM  Result Value Ref Range   Sodium 139 135 - 145 mmol/L   Potassium 5.0 3.5 - 5.1 mmol/L   Chloride 99 98 - 111 mmol/L   CO2 24 22 - 32 mmol/L   Glucose, Bld 96 70 - 99 mg/dL    Comment: Glucose reference range applies only to samples taken after fasting for at least 8 hours.   BUN 41 (H) 6 - 20 mg/dL   Creatinine, Ser 9.56 (H) 0.61 - 1.24 mg/dL   Calcium 8.7 (L) 8.9 - 10.3 mg/dL   Total Protein 6.8 6.5 - 8.1 g/dL   Albumin 2.4 (L) 3.5 - 5.0 g/dL   AST 14 (L) 15 - 41 U/L   ALT 13 0 - 44 U/L   Alkaline Phosphatase 261 (H) 38 - 126 U/L   Total Bilirubin 0.7 0.3 - 1.2 mg/dL   GFR, Estimated 6 (L) >60 mL/min    Comment: (NOTE) Calculated using the CKD-EPI Creatinine Equation (2021)    Anion gap 16 (H) 5 - 15    Comment: Performed at [facility], [place] [address], [place], [place] [address]  CBC with Differential     Status: Abnormal   Collection Time: 05/12/20  1:19 PM  Result Value Ref Range   WBC 7.3 4.0 - 10.5 K/uL   RBC 3.70 (L) 4.22 - 5.81 MIL/uL   Hemoglobin 10.8 (L) 13.0 - 17.0 g/dL   HCT 37.6 (L) 39.0 - 52.0 %   MCV 101.6 (H) 80.0 - 100.0 fL   MCH 29.2 26.0 - 34.0 pg   MCHC 28.7 (L) 30.0 - 36.0 g/dL   RDW 17.6 (H) 11.5 - 15.5 %   Platelets 321 150 - 400 K/uL   nRBC 0.0 0.0 - 0.2 %   Neutrophils Relative % 77 %   Neutro Abs 5.6 1.7 - 7.7 K/uL   Lymphocytes Relative 10 %   Lymphs Abs 0.7 0.7 - 4.0 K/uL   Monocytes Relative 11 %   Monocytes Absolute 0.8 0.1 - 1.0 K/uL   Eosinophils Relative 1 %   Eosinophils Absolute 0.1 0.0 - 0.5 K/uL   Basophils Relative 1 %   Basophils Absolute 0.1 0.0 - 0.1 K/uL   Immature Granulocytes 0 %   Abs Immature Granulocytes 0.03 0.00 - 0.07 K/uL    Comment: Performed at [facility], [place] [address] [place], [place] [address]  SARS CORONAVIRUS 2 (TAT 6-24 HRS) Nasopharyngeal Nasopharyngeal Swab     Status: None   Collection  Time: 05/12/20  8:38 PM   Specimen: Nasopharyngeal Swab  Result Value Ref Range   SARS Coronavirus 2 NEGATIVE NEGATIVE    Comment: (NOTE) SARS-CoV-2 target nucleic acids are NOT DETECTED.  The SARS-CoV-2 RNA is generally detectable in upper and  lower respiratory specimens during the acute phase of infection. Negative results do not preclude SARS-CoV-2 infection, do not rule out co-infections with other pathogens, and should not be used as the sole basis for treatment or other patient management decisions. Negative results must be combined with clinical observations, patient history, and epidemiological information. The expected result is Negative.  Fact Sheet for Patients: SugarRoll.be  Fact Sheet for Healthcare Providers: https://www.woods-mathews.com/  This test is not yet approved or cleared by the [place] FDA and  has been authorized for detection and/or diagnosis of SARS-CoV-2 by FDA under an Emergency Use Authorization (EUA). This EUA will remain  in effect (meaning this test can be used) for the duration of the COVID-19 declaration under Se ction 564(b)(1) of the Act, 21 U.S.C. section 360bbb-3(b)(1), unless the authorization is terminated or revoked sooner.  Performed at [facility], [place] [address], [place], [place] [address]   Lactic acid, plasma     Status: None   Collection Time: 05/12/20  9:29 PM  Result Value Ref Range   Lactic Acid, Venous 1.9 0.5 - 1.9 mmol/L    Comment: Performed at [facility], [place] [address], [place], [place] [address]  Blood culture (routine x 2)     Status: None (Preliminary result)   Collection Time: 05/12/20 10:06 PM   Specimen: BLOOD  Result Value Ref Range   Specimen Description BLOOD LEFT ARM    Special Requests      BOTTLES DRAWN AEROBIC AND ANAEROBIC Blood Culture adequate volume   Culture      NO GROWTH < 12 HOURS Performed at [facility], [place] [address] [place], [place] [address]    Report Status PENDING   CBG monitoring, ED     Status: None   Collection Time: 05/12/20 11:18 PM  Result Value Ref Range   Glucose-Capillary 73 70 - 99 mg/dL    Comment: Glucose reference range applies only to samples taken after fasting for at least 8 hours.  CBG monitoring, ED     Status: None   Collection Time: 05/13/20  3:35 AM  Result Value Ref Range   Glucose-Capillary 98 70 - 99 mg/dL    Comment: Glucose reference range applies only to samples taken after fasting for at least 8 hours.  Hemoglobin A1c     Status: Abnormal   Collection Time: 05/13/20  5:26 AM  Result Value Ref Range   Hgb A1c MFr Bld 7.6 (H) 4.8 - 5.6 %    Comment: (NOTE) Pre diabetes:          5.7%-6.4%  Diabetes:              >6.4%  Glycemic control for   <7.0% adults with diabetes    Mean Plasma Glucose 171.42 mg/dL    Comment: Performed at [facility] [address] [place], [place] [address]  Prealbumin     Status: Abnormal   Collection Time: 05/13/20  5:26 AM  Result Value Ref Range   Prealbumin 7.3 (L) 18 - 38 mg/dL    Comment: Performed at [facility] [address], [place], [place] [address]  HIV Antibody (routine testing w rflx)     Status: None   Collection Time: 05/13/20  5:26 AM  Result Value Ref Range   HIV Screen 4th Generation wRfx Non Reactive Non Reactive    Comment: Performed at [facility], [place] [address] [place], [place] [address]  Magnesium     Status: None   Collection Time: 05/13/20  5:26 AM  Result Value Ref Range   Magnesium 2.3 1.7 - 2.4 mg/dL    Comment: Performed at [facility] [address] [place], [place] [address]  Phosphorus     Status: Abnormal   Collection Time: 05/13/20  5:26 AM  Result Value Ref Range   Phosphorus 7.6 (H) 2.5 - 4.6 mg/dL    Comment: Performed at [facility] [address] [place], [place] [address]  CBC WITH DIFFERENTIAL     Status: Abnormal   Collection Time: 05/13/20   5:26 AM  Result Value Ref Range   WBC 7.6 4.0 - 10.5 K/uL   RBC 3.52 (L) 4.22 - 5.81 MIL/uL   Hemoglobin 10.4 (L) 13.0 - 17.0 g/dL   HCT 34.3 (L) 39.0 - 52.0 %   MCV 97.4 80.0 - 100.0 fL   MCH 29.5 26.0 - 34.0 pg   MCHC 30.3 30.0 - 36.0 g/dL   RDW 17.7 (H) 11.5 - 15.5 %   Platelets 353 150 - 400 K/uL   nRBC 0.0 0.0 - 0.2 %   Neutrophils Relative % 75 %   Neutro Abs 5.8 1.7 - 7.7 K/uL   Lymphocytes Relative 9 %   Lymphs Abs 0.7 0.7 - 4.0 K/uL   Monocytes Relative 11 %   Monocytes Absolute 0.8 0.1 - 1.0 K/uL   Eosinophils Relative 3 %   Eosinophils Absolute 0.2 0.0 - 0.5 K/uL   Basophils Relative 1 %   Basophils Absolute 0.1 0.0 - 0.1 K/uL   Immature Granulocytes 1 %   Abs Immature Granulocytes 0.04 0.00 - 0.07 K/uL    Comment: Performed at [facility], [address] [place], [place] [address]  TSH     Status: None   Collection Time: 05/13/20  5:26 AM  Result Value Ref Range   TSH 0.953 0.350 - 4.500 uIU/mL    Comment: Performed by a 3rd Generation assay with a functional sensitivity of <=0.01 uIU/mL. Performed at [facility], [place] [address] [place], [place] [address]   Comprehensive metabolic panel     Status: Abnormal   Collection Time: 05/13/20  5:26 AM  Result Value Ref Range   Sodium 136 135 - 145 mmol/L   Potassium 5.2 (H) 3.5 - 5.1 mmol/L   Chloride 98 98 - 111 mmol/L   CO2 23 22 - 32 mmol/L   Glucose, Bld 112 (H) 70 - 99 mg/dL    Comment: Glucose reference range applies only to samples taken after fasting for at least 8 hours.   BUN 45 (H) 6 - 20 mg/dL   Creatinine, Ser 10.13 (H) 0.61 - 1.24 mg/dL   Calcium 8.5 (L) 8.9 - 10.3 mg/dL   Total Protein 6.6 6.5 - 8.1 g/dL   Albumin 2.2 (L) 3.5 - 5.0 g/dL   AST 13 (L) 15 - 41 U/L   ALT 13 0 - 44 U/L   Alkaline Phosphatase 215 (H) 38 - 126 U/L   Total Bilirubin 0.8 0.3 - 1.2 mg/dL   GFR, Estimated 6 (L) >60 mL/min    Comment: (NOTE) Calculated using the CKD-EPI Creatinine Equation (2021)    Anion  gap 15 5 - 15    Comment: Performed at [facility] [address], [place], [place] [address]  Vitamin B12     Status: None   Collection Time: 05/13/20  5:26 AM  Result Value Ref Range   Vitamin B-12 245 180 - 914 pg/mL    Comment: (NOTE) This assay is not validated for testing neonatal or myeloproliferative syndrome specimens for Vitamin B12 levels. Performed at [facility], [place] [address], [place], [place] [address]   Folate     Status: None   Collection Time: 05/13/20  5:26 AM  Result Value Ref Range   Folate 8.0 >5.9 ng/mL    Comment: Performed at [facility] [address] [place], [place] [address]  Iron and TIBC     Status: Abnormal   Collection Time: 05/13/20  5:26 AM  Result Value Ref Range   Iron 20 (L) 45 - 182 ug/dL   TIBC 167 (L) 250 - 450 ug/dL   Saturation Ratios 12 (L) 17.9 - 39.5 %   UIBC 147 ug/dL    Comment: Performed at [facility], [place] [address] [place], [place] [address]  Ferritin     Status: None   Collection Time: 05/13/20  5:26 AM  Result Value Ref Range   Ferritin 62 24 - 336 ng/mL    Comment: Performed at [facility] [address], [place], [place] [address]  Reticulocytes     Status: Abnormal   Collection Time: 05/13/20  5:26 AM  Result Value Ref Range   Retic Ct Pct 2.0 0.4 - 3.1 %   RBC. 3.52 (L) 4.22 - 5.81 MIL/uL   Retic Count, Absolute 69.3 19.0 - 186.0 K/uL   Immature Retic Fract 34.2 (H) 2.3 - 15.9 %    Comment: Performed at [facility] [address], [place], [place] [address]  C-reactive protein     Status: Abnormal   Collection Time: 05/13/20  5:26 AM  Result Value Ref Range   CRP 13.5 (H) <1.0 mg/dL    Comment: Performed at [facility] [address] [place], [place] [address]  Sedimentation rate     Status: Abnormal   Collection Time: 05/13/20  5:26 AM  Result Value Ref Range   Sed Rate 63 (H) 0 - 16 mm/hr    Comment: Performed at [facility] [address],  [place], [place] [address]  Glucose, capillary     Status: Abnormal   Collection Time: 05/13/20  7:33 AM  Result Value Ref Range   Glucose-Capillary 104 (H) 70 - 99 mg/dL    Comment: Glucose reference range applies only to samples taken after fasting for at least 8 hours.  Hepatitis B surface antigen     Status: None   Collection Time: 05/13/20 11:15 AM  Result Value Ref Range   Hepatitis B Surface Ag NON REACTIVE NON REACTIVE    Comment: Performed at [facility] [address] [place], [place] [address]  Hepatitis B core antibody, total     Status: None   Collection Time: 05/13/20 11:16 AM  Result Value Ref Range   Hep B Core Total Ab NON REACTIVE NON REACTIVE    Comment: Performed at [facility] [address], [place], [place] [address]  Hepatitis B surface antibody,qualitative     Status: None   Collection Time: 05/13/20 11:16 AM  Result Value Ref Range   Hep B S Ab NON REACTIVE NON REACTIVE    Comment: (NOTE) Inconsistent with immunity, less than 10 mIU/mL.  Performed at [facility], [place] [address], [place], [place] [address]   Glucose, capillary  Status: Abnormal   Collection Time: 05/13/20  4:16 PM  Result Value Ref Range   Glucose-Capillary 117 (H) 70 - 99 mg/dL    Comment: Glucose reference range applies only to samples taken after fasting for at least 8 hours.    VAS [place] ABI WITH/WO TBI  Result Date: 05/13/2020 LOWER EXTREMITY DOPPLER STUDY Indications: Peripheral artery disease.  Comparison Study: Known Non-compressible ABI's 1.6 bilaterally in 2021 Performing Technologist: Vonzell Schlatter RVT  Examination Guidelines: A complete evaluation includes at minimum, Doppler waveform signals and systolic blood pressure reading at the level of bilateral brachial, anterior tibial, and posterior tibial arteries, when vessel segments are accessible. Bilateral testing is considered an integral part of a complete examination. Photoelectric Plethysmograph (PPG)  waveforms and toe systolic pressure readings are included as required and additional duplex testing as needed. Limited examinations for reoccurring indications may be performed as noted.  ABI Findings: +---------+------------------+-----+--------+--------+ Right    Rt Pressure (mmHg)IndexWaveformComment  +---------+------------------+-----+--------+--------+ Brachial                                AVF      +---------+------------------+-----+--------+--------+ Great Toe83                0.56 Normal           +---------+------------------+-----+--------+--------+ +---------+------------------+-----+---------+---------------------------------+ Left     Lt Pressure (mmHg)IndexWaveform Comment                           +---------+------------------+-----+---------+---------------------------------+ Brachial 148                    triphasic                                  +---------+------------------+-----+---------+---------------------------------+ Great Toe111               0.75 Normal   mildly dampened compared to right                                          great toe                         +---------+------------------+-----+---------+---------------------------------+ +-------+-----------+-----------+------------+------------+ ABI/TBIToday's ABIToday's TBIPrevious ABIPrevious TBI +-------+-----------+-----------+------------+------------+ Right             .56                    .72          +-------+-----------+-----------+------------+------------+ Left              .75                    .92          +-------+-----------+-----------+------------+------------+ Right TBIs appear decreased. Left TBIs appear decreased.  Summary: Right: Resting right ankle-brachial index indicates moderate right lower extremity arterial disease. Left: Resting left ankle-brachial index indicates moderate left lower extremity arterial disease.  *See table(s) above for  measurements and observations.  Electronically signed by Deitra Mayo MD on 05/13/2020 at 6:24:49 PM.   Final     Review of Systems Blood pressure 131/60, pulse 89, temperature 98.8 F (37.1 C), temperature source Oral, resp. rate 16, height 5'  5" (1.651 m), weight 106.3 kg, SpO2 100 %. Physical Exam General: AAO x3, NAD  Dermatological: Ulceration present on plantar aspect of left heel with necrotic tissue with tenderness palpation.  Small clear drainage expressed with there is no purulence but there is no fluctuance or crepitation.  There is no surrounding erythema, ascending cellulitis.  Tenderness palpation of the wound.  Preulcerative area to the right heel without any drainage or pus.  No fluctuation crepitation.  Vascular: Dorsalis Pedis artery and Posterior Tibial artery pedal pulses are decreased bilateral.  Chronic edema bilaterally  Neruologic: Sensation decreased  Musculoskeletal: Tenderness palpation of the plantar left heel.  No other areas of tenderness today.  Assessment/Plan:  Left heel necrotic ulceration with concern for osteomyelitis  I again reviewed the MRI with him.  Also reviewed the circulation studies.  Given the decreasing as well as the chronic nature of the ulceration would recommend vascular surgery consultation.  In regards to the ulceration I discussed with him both conservative as well as surgical options.  Discussed with him surgical excision of the wound as well as debridement and bone biopsy.  Discussed with him synthetic graft application as well.  We discussed the surgery as well as postoperative course.  Discussed with them likely need for serial debridements.  We discussed risks of surgery including, but not limited to, spread of infection, limb loss, delayed or nonhealing as well as general risks of surgery.  He understands and wishes to proceed.  We will plan on doing this tomorrow evening. Consent to be signed.  -Will likely need infectious  disease consult pending cultures -X-ray of the right heel, float heals  Trula Slade 05/13/2020, 7:11 PM

## 2020-05-13 NOTE — Progress Notes (Signed)
Initial Nutrition Assessment  DOCUMENTATION CODES:   Obesity unspecified  INTERVENTION:   -Renal MVI daily -1 packet Juven BID, each packet provides 95 calories, 2.5 grams of protein (collagen), and 9.8 grams of carbohydrate (3 grams sugar); also contains 7 grams of L-arginine and L-glutamine, 300 mg vitamin C, 15 mg vitamin E, 1.2 mcg vitamin B-12, 9.5 mg zinc, 200 mg calcium, and 1.5 g  Calcium Beta-hydroxy-Beta-methylbutyrate to support wound healing -30 ml Prosource Plus TID, each supplement provides 100 kcals and 15 grams protein  NUTRITION DIAGNOSIS:   Increased nutrient needs related to wound healing as evidenced by estimated needs.  GOAL:   Patient will meet greater than or equal to 90% of their needs  MONITOR:   PO intake,Supplement acceptance,Weight trends,Labs,Skin,I & O's  REASON FOR ASSESSMENT:   Consult Wound healing  ASSESSMENT:   55 y.o. 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   Reviewed I/O's: 100 ml x 24 hours  Per podiatry notes, plan for antibiotics and arterial studies today. Pt will likely undergo debridement and biopsy on Wednesday, 05/14/20.   Pt unavailable at times of visits x 2.   No meal completions available to assess at this time.   Reviewed wt hx; wt has been stable over the past year.   Pt with increased nutritional needs for wound healing and would benefit from addition of oral nutrition supplements.   Medications reviewed and include phoslo and velphro.   Albumin has a half-life of 21 days and is strongly affected by stress response and inflammatory process, therefore, do not expect to see an improvement in this lab value during acute hospitalization. When a patient presents with low albumin, it is likely skewed due to the acute  inflammatory response.Note that low albumin is no longer used to diagnose malnutrition; [facility] uses the new malnutrition guidelines published by the American Society for Parenteral and Enteral Nutrition (A.S.P.E.N.) and the Academy of Nutrition and Dietetics (AND).    Lab Results  Component Value Date   HGBA1C 7.6 (H) 05/13/2020   PTA DM medications are pt with omnipod and CGM at home.   Labs reviewed: K: 5.2, Phos: 7.6, Mg WDL, : 104 (inpatient orders for glycemic control are 0-6 units insulin aspart every 4 hours).   Diet Order:   Diet Order            Diet NPO time specified  Diet effective midnight           Diet Carb Modified Fluid consistency: Thin; Room service appropriate? Yes  Diet effective now                 EDUCATION NEEDS:   No education needs have been identified at this time  Skin:  Skin Assessment: Skin Integrity Issues: Skin Integrity Issues:: Diabetic Ulcer Diabetic Ulcer: rt and lt heel  Last BM:  05/12/20  Height:   Ht Readings from Last 1 Encounters:  05/12/20 '5\' 5"'$  (1.651 m)    Weight:   Wt Readings from Last 1 Encounters:  05/13/20 109.3 kg    Ideal Body Weight:  61.8 kg  BMI:  Body mass index is 40.1 kg/m.  Estimated Nutritional Needs:   Kcal:  1950-2150  Protein:  120-135 grams  Fluid:  1000 ml + UOP    [name], RD, LDN,  CDCES Registered Dietitian II Certified Diabetes Care and Education Specialist Please refer to [facility] for RD and/or RD on-call/weekend/after hours pager

## 2020-05-13 NOTE — Procedures (Signed)
   I was present at this dialysis session, have reviewed the session itself and made  appropriate changes Kelly Splinter MD [place] pager [phone]   05/13/2020, 3:33 PM

## 2020-05-13 NOTE — ED Notes (Signed)
Assisted patient with replacing ace bandage to Rt hand per his request. Patient currently speaking to his mother on the phone, no acute distress noted. Denies further needs.

## 2020-05-13 NOTE — ED Notes (Signed)
Attempted to call report, receiving RN unavailable at this time.  

## 2020-05-13 NOTE — Evaluation (Addendum)
Occupational Therapy Evaluation Patient Details Name: [name] [name] MRN: [id number] DOB: July 03, 1965 Today's Date: 05/13/2020    History of Present Illness Pt is a 55 y.o. 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 Likely plan for OR debridement and biopsy 2/23. PMH includes ESRD (HD TTS), DM2, HTN, gout, peripheral neuropathy, CHF, s/p failed renal transplant.   Clinical Impression   Pt presents with decline in function and safety with ADLs and ADL mobility with impaired strength, balance and endurance. Pt lives at home alone and was Ind with ADLs/selfcare and used cane for mobility prior to pain in L foot. Pt's mother has been assisting him with LB ADLs since then. Pt would benefit from acute OT services to address impairments to maximize level of function and safety    Follow Up Recommendations  Home health OT;Supervision    Equipment Recommendations  3 in 1 bedside commode;Other (comment) (reacher, sock aid, LH bath sponge, LH shoe horn)    Recommendations for Other Services       Precautions / Restrictions Precautions Precautions: Fall;Other (comment) Precaution Comments: L foot ulcer (plan for debridement 2/23) Restrictions Weight Bearing Restrictions: No      Mobility Bed Mobility Overal bed mobility: Modified Independent             General bed mobility comments: Increased time and effort, HOB elevated    Transfers Overall transfer level: Needs assistance Equipment used: Rolling walker (2 wheeled) Transfers: Sit to/from Stand Sit to Stand: Min assist         General transfer comment: Cues for hand placement and sequencing, good ability to offload buttocks, requiring minA to assist trunk elevation and stability    Balance Overall balance assessment: Needs assistance Sitting-balance support: Feet supported Sitting balance-Leahy Scale: Good     Standing balance support: Bilateral upper  extremity supported;During functional activity Standing balance-Leahy Scale: Poor Standing balance comment: Reliant on UE support                           ADL either performed or assessed with clinical judgement   ADL Overall ADL's : Needs assistance/impaired Eating/Feeding: Set up;Independent;Sitting   Grooming: Wash/dry hands;Wash/dry face;Standing;Min guard   Upper Body Bathing: Set up;Supervision/ safety   Lower Body Bathing: Moderate assistance   Upper Body Dressing : Supervision/safety;Set up   Lower Body Dressing: Moderate assistance   Toilet Transfer: Minimal assistance;Ambulation;RW;Cueing for safety;BSC   Toileting- Clothing Manipulation and Hygiene: Minimal assistance;Sit to/from stand       Functional mobility during ADLs: Minimal assistance;Rolling walker;Cueing for safety;Cueing for sequencing General ADL Comments: pt and his mother educated on ADL A/E for LB selfcare with video demo and handout provided     Vision Baseline Vision/History: Wears glasses Wears Glasses: At all times Patient Visual Report: No change from baseline       Perception     Praxis      Pertinent Vitals/Pain Pain Assessment: Faces Faces Pain Scale: Hurts little more Pain Location: L foot Pain Descriptors / Indicators: Discomfort;Grimacing;Guarding Pain Intervention(s): Monitored during session;Limited activity within patient's tolerance;Repositioned     Hand Dominance Right   Extremity/Trunk Assessment Upper Extremity Assessment Upper Extremity Assessment: RUE deficits/detail;LUE deficits/detail RUE Deficits / Details: Bilateral finger amputations (pt reports due to Raynaud's), wearing compression glove LUE Deficits / Details: Bilateral finger amputations (pt reports due to Raynaud's), wrapped in ace wrap; reports most recent sx to thumb/5th finger  amputation just before Christmas   Lower Extremity Assessment Lower Extremity Assessment: Defer to PT  evaluation RLE Deficits / Details: Functionally >3/5 throughout LLE Deficits / Details: L heel ulcer; hip and knee functionally at least 3/5 throughout       Communication Communication Communication: No difficulties   Cognition Arousal/Alertness: Awake/alert Behavior During Therapy: WFL for tasks assessed/performed Overall Cognitive Status: Within Functional Limits for tasks assessed                                 General Comments: WFL for simple tasks; pt hyperverbose and at times distracted by this requiring cues to stay on task   General Comments  mother present and supportive    Exercises     Shoulder Instructions      Home Living Family/patient expects to be discharged to:: Private residence Living Arrangements: Alone Available Help at Discharge: Family;Available 24 hours/day Type of Home: House Home Access: Stairs to enter [employer] of Steps: 8 Entrance Stairs-Rails: Left;Right;Can reach both Home Layout: Two level;Full bath on main level;Able to live on main level with bedroom/bathroom     Bathroom Shower/Tub: Occupational psychologist: Standard     Home Equipment: Cane - single point;Shower seat;Walker - 2 wheels;Hand held shower head;Adaptive equipment Adaptive Equipment: Reacher Additional Comments: sleeps in recliner. Reports, "My mother is over all the time"      Prior Functioning/Environment Level of Independence: Needs assistance  Gait / Transfers Assistance Needed: Reports mod indep ambulating with [facility] ADL's / Homemaking Assistance Needed: Mother assists with ADLs and household tasks as needed, including lower body dressing (shoes/socks) and household/meal prep   Comments: sleeps in recliner        OT Problem List: Decreased activity tolerance;Decreased coordination;Impaired balance (sitting and/or standing);Pain;Decreased strength      OT Treatment/Interventions: Self-care/ADL training;Therapeutic  exercise;Patient/family education;Therapeutic activities;DME and/or AE instruction    OT Goals(Current goals can be found in the care plan section) Acute Rehab OT Goals Patient Stated Goal: Return home with [facility] services OT Goal Formulation: With patient/family Time For Goal Achievement: 05/27/20 Potential to Achieve Goals: Good ADL Goals Pt Will Perform Grooming: with supervision;with set-up;with modified independence;standing Pt Will Perform Lower Body Bathing: with min assist;with min guard assist;with supervision;with adaptive equipment Pt Will Perform Lower Body Dressing: with min assist;with min guard assist;with supervision;with adaptive equipment Pt Will Transfer to Toilet: with min guard assist;with modified independence;ambulating Pt Will Perform Toileting - Clothing Manipulation and hygiene: with min guard assist;with supervision;sit to/from stand  OT Frequency: Min 2X/week   Barriers to D/C:            Co-evaluation              AM-PAC OT "6 Clicks" Daily Activity     Outcome Measure Help from another person eating meals?: None Help from another person taking care of personal grooming?: A Little Help from another person toileting, which includes using toliet, bedpan, or urinal?: A Little Help from another person bathing (including washing, rinsing, drying)?: A Lot Help from another person to put on and taking off regular upper body clothing?: None Help from another person to put on and taking off regular lower body clothing?: A Lot 6 Click Score: 18   End of Session Equipment Utilized During Treatment: Rolling walker;Other (comment) (3 in 1)  Activity Tolerance: Patient tolerated treatment well Patient left: in chair;with call bell/phone within reach;with family/visitor  present  OT Visit Diagnosis: Other abnormalities of gait and mobility (R26.89);Muscle weakness (generalized) (M62.81);Pain Pain - Right/Left: Left Pain - part of body: Ankle and joints of foot                 Time: 0921-1003 OT Time Calculation (min): 42 min Charges:  OT General Charges $OT Visit: 1 Visit OT Evaluation $OT Eval Moderate Complexity: 1 Mod OT Treatments $Self Care/Home Management : 8-22 mins    Britt Bottom 05/13/2020, 11:58 AM

## 2020-05-13 NOTE — Progress Notes (Signed)
I will be by to see the patient today. I saw him yesterday in the clinic as well. The plan for today is to continue with antibiotics and get arterial studies. Likely plan for OR debridement, biopsy Wednesday.   Celesta Gentile, DPM

## 2020-05-13 NOTE — Evaluation (Signed)
Physical Therapy Evaluation Patient Details Name: [name] [name] MRN: [id number] DOB: 1965/08/23 Today's Date: 05/13/2020   History of Present Illness  Pt is a 55 y.o. 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 Likely plan for OR debridement and biopsy 2/23. PMH includes ESRD (HD TTS), DM2, HTN, gout, peripheral neuropathy, CHF, s/p failed renal transplant.    Clinical Impression  Pt presents with an overall decrease in functional mobility secondary to above. PTA, pt mod indep ambulating with SPC due to L foot pain; mother available for 24/7 assist with ADLs and household tasks as needed. Today, pt able to initiate transfer and gait training with RW, requiring up to minA for mobility. Pt would benefit from continued acute PT services to maximize functional mobility and independence prior to d/c with HHPT services.     Follow Up Recommendations Home health PT;Supervision for mobility/OOB    Equipment Recommendations  3in1 (PT)    Recommendations for Other Services       Precautions / Restrictions Precautions Precautions: Fall;Other (comment) Precaution Comments: L foot ulcer (plan for debridement 2/23)      Mobility  Bed Mobility Overal bed mobility: Modified Independent             General bed mobility comments: Increased time and effort, HOB elevated    Transfers Overall transfer level: Needs assistance Equipment used: Rolling walker (2 wheeled) Transfers: Sit to/from Stand Sit to Stand: Min assist         General transfer comment: Cues for hand placement and sequencing, good ability to offload buttocks, requiring minA to assist trunk elevation and stability  Ambulation/Gait Ambulation/Gait assistance: Min guard Gait Distance (Feet): 30 Feet Assistive device: Rolling walker (2 wheeled) Gait Pattern/deviations: Step-through pattern;Decreased stride length;Antalgic;Trunk flexed Gait velocity:  Decreased   General Gait Details: Slow, antalgic gait with RW and intermittent min guard for balance; 1x seated rest on BSC in bathroom; intermittent cues for sequencing with turns  Stairs            Wheelchair Mobility    Modified Rankin (Stroke Patients Only)       Balance Overall balance assessment: Needs assistance   Sitting balance-Leahy Scale: Good       Standing balance-Leahy Scale: Poor Standing balance comment: Reliant on UE support                             Pertinent Vitals/Pain Pain Assessment: Faces Faces Pain Scale: Hurts little more Pain Location: L foot Pain Descriptors / Indicators: Discomfort;Grimacing;Guarding Pain Intervention(s): Monitored during session;Limited activity within patient's tolerance    Home Living Family/patient expects to be discharged to:: Private residence Living Arrangements: Alone Available Help at Discharge: Family;Available 24 hours/day Type of Home: House Home Access: Stairs to enter Entrance Stairs-Rails: Left;Right;Can reach both Entrance Stairs-Number of Steps: 8 Home Layout: Two level;Full bath on main level;Able to live on main level with bedroom/bathroom Home Equipment: Kasandra Knudsen - single point;Shower seat;Walker - 2 wheels;Hand held shower head Additional Comments: sleeps in recliner. Reports, "My mother is over all the time"    Prior Function Level of Independence: Needs assistance   Gait / Transfers Assistance Needed: Reports mod indep ambulating with [facility]  ADL's / Homemaking Assistance Needed: Mother assists with ADLs and household tasks as needed, including lower body dressing (shoes/socks) and household/meal prep  Comments: sleeps in recliner     Hand Dominance   Dominant  Hand: Right    Extremity/Trunk Assessment   Upper Extremity Assessment Upper Extremity Assessment: RUE deficits/detail;LUE deficits/detail RUE Deficits / Details: Bilateral finger amputations (pt reports due to Raynaud's),  wearing compression glove LUE Deficits / Details: Bilateral finger amputations (pt reports due to Raynaud's), wrapped in ace wrap; reports most recent sx to thumb/5th finger amputation just before Christmas    Lower Extremity Assessment Lower Extremity Assessment: RLE deficits/detail;LLE deficits/detail RLE Deficits / Details: Functionally >3/5 throughout LLE Deficits / Details: L heel ulcer; hip and knee functionally at least 3/5 throughout       Communication   Communication: No difficulties  Cognition Arousal/Alertness: Awake/alert Behavior During Therapy: WFL for tasks assessed/performed Overall Cognitive Status: Within Functional Limits for tasks assessed                                 General Comments: WFL for simple tasks; pt verbose and at times distracted by this requiring cues to stay on task      General Comments      Exercises     Assessment/Plan    PT Assessment Patient needs continued PT services  PT Problem List Decreased strength;Decreased activity tolerance;Decreased balance;Decreased mobility;Decreased knowledge of use of DME;Decreased knowledge of precautions;Pain       PT Treatment Interventions DME instruction;Gait training;Stair training;Functional mobility training;Therapeutic activities;Therapeutic exercise;Balance training;Patient/family education    PT Goals (Current goals can be found in the Care Plan section)  Acute Rehab PT Goals Patient Stated Goal: Return home with [facility] services PT Goal Formulation: With patient/family Time For Goal Achievement: 05/27/20 Potential to Achieve Goals: Good    Frequency Min 3X/week   Barriers to discharge        Co-evaluation               AM-PAC PT "6 Clicks" Mobility  Outcome Measure Help needed turning from your back to your side while in a flat bed without using bedrails?: None Help needed moving from lying on your back to sitting on the side of a flat bed without using bedrails?:  None Help needed moving to and from a bed to a chair (including a wheelchair)?: A Little Help needed standing up from a chair using your arms (e.g., wheelchair or bedside chair)?: A Little Help needed to walk in hospital room?: A Little Help needed climbing 3-5 steps with a railing? : A Lot 6 Click Score: 19    End of Session   Activity Tolerance: Patient tolerated treatment well Patient left: in chair;with call bell/phone within reach Nurse Communication: Mobility status PT Visit Diagnosis: Other abnormalities of gait and mobility (R26.89);Muscle weakness (generalized) (M62.81);Pain    Time: [id number] PT Time Calculation (min) (ACUTE ONLY): 40 min   Charges:   PT Evaluation $PT Eval Moderate Complexity: 1 Mod PT Treatments $Therapeutic Activity: 8-22 mins      Mabeline Caras, PT, DPT Acute Rehabilitation Services  Pager [phone] Office [phone]  Derry Lory 05/13/2020, 9:54 AM

## 2020-05-13 NOTE — Plan of Care (Signed)
?  Problem: Clinical Measurements: ?Goal: Ability to maintain clinical measurements within normal limits will improve ?Outcome: Progressing ?Goal: Will remain free from infection ?Outcome: Progressing ?Goal: Diagnostic test results will improve ?Outcome: Progressing ?  ?

## 2020-05-13 NOTE — Consult Note (Signed)
WOC Nurse Consult Note: Patient receiving care in [facility].  I have communicated with the supervising Podiatrist, Celesta Gentile, via [place]. Reason for Consult: LE wound Dr. Jacqualyn Posey will look at both heel wounds and make recommendations for care.  Direct all questions pertaining to foot wound care to this provider. The [facility] nurse did not see and will not follow this patient. Val Riles, RN, MSN, CWOCN, CNS-BC, pager [phone]

## 2020-05-13 NOTE — Progress Notes (Signed)
PROGRESS NOTE    [name] [name]  [id number] DOB: [birth year] DOA: 05/12/2020 PCP: [name], MD    Brief Narrative:  55 year old gentleman with history of ESRD on hemodialysis, type 2 diabetes on insulin pump recently not using, gout, peripheral neuropathy, patient with Raynaud's disease, diabetic neuropathy presents to the hospital with nonhealing wound of the left foot.  Patient has been suffering from nonhealing wound of the left foot, now has also developed wound on right foot.  Patient recently has been followed by Dr. Jacqualyn Posey and seen outpatient.  Increasing pain and ulceration, he was sent to ER for admission and IV antibiotics.   Assessment & Plan:   Active Problems:   HYPERCHOLESTEROLEMIA   Essential hypertension   Diabetes mellitus type 2, uncontrolled ([place])   ESRD on hemodialysis ([place])   Type II diabetes mellitus with renal manifestations ([place])   Secondary hyperparathyroidism ([place])   Secondary anemia   Osteomyelitis due to type 2 diabetes mellitus ([place])   Diabetic foot ulcer ([place])  Diabetic foot ulcer, bilateral diabetic foot infection, suspected left foot osteomyelitis: MRI 2/16, soft tissue infection, suspected osteomyelitis.  Failed oral antibiotic therapy. Vascular ABI 2/5 with noncompressible vessels but adequate perfusion.  No evidence of ischemia. Remains on vancomycin, Rocephin and Flagyl.  Blood cultures pending. Patient will need wide debridement that will be planned by podiatry surgery.  Type 2 diabetes with renal complication: Patient apparently on insulin pump and has not used any insulin for last 1 month.  He stated that his blood sugars were normal to low. Currently remains on sliding scale. Difficult finger prick secondary to Raynaud's disease and ulcerations of the fingers, he can use his sensor from home.  ESRD on hemodialysis: Going for dialysis today.  Followed by nephro.  Anemia of chronic disease: Stable.  Hypertension: Blood  pressure fairly stable.  Uses midodrine for intradialytic hypotension.     DVT prophylaxis: SCDs Start: 05/12/20 2208   Code Status: DNR Family Communication: None at bedside. Disposition Plan: Status is: Inpatient  Remains inpatient appropriate because:Inpatient level of care appropriate due to severity of illness   Dispo: The patient is from: Home              Anticipated d/c is to: Home              Anticipated d/c date is: 3 days              Patient currently is not medically stable to d/c.   Difficult to place patient No         Consultants:   Nephrology  Podiatry  Procedures:   None  Antimicrobials:  Antibiotics Given (last 72 hours)    Date/Time Action Medication Dose Rate   05/12/20 2121 New Bag/Given  [by gravity]   cefTRIAXone (ROCEPHIN) 2 g in sodium chloride 0.9 % 100 mL IVPB 2 g 200 mL/hr   05/12/20 2201 New Bag/Given   vancomycin (VANCOREADY) IVPB 2000 mg/400 mL 2,000 mg 200 mL/hr   05/12/20 2258 New Bag/Given   metroNIDAZOLE (FLAGYL) IVPB 500 mg 500 mg 100 mL/hr   05/13/20 0540 New Bag/Given   metroNIDAZOLE (FLAGYL) IVPB 500 mg 500 mg 100 mL/hr   05/13/20 1352 New Bag/Given   vancomycin (VANCOCIN) IVPB 1000 mg/200 mL premix 1,000 mg 200 mL/hr         Subjective: Patient seen and examined.  He was very worried about his legs.  No other overnight events.  Left leg hurts.  Afebrile.  Going  for dialysis today.   Objective: Vitals:   05/13/20 1230 05/13/20 1300 05/13/20 1330 05/13/20 1400  BP: (!) 142/97 135/65 101/63 136/70  Pulse:      Resp: 16     Temp:      TempSrc:      SpO2:      Weight:      Height:        Intake/Output Summary (Last 24 hours) at 05/13/2020 1419 Last data filed at 05/13/2020 0420 Gross per 24 hour  Intake 100.11 ml  Output -  Net 100.11 ml   Filed Weights   05/12/20 2000 05/13/20 1143  Weight: 108.9 kg 109.3 kg    Examination:  General exam: Appears calm and comfortable  Chronically sick looking  but not in any distress. Respiratory system: Clear to auscultation. Respiratory effort normal.  No added sounds. Cardiovascular system: S1 & S2 heard, RRR. Gastrointestinal system: Soft and nontender.   Central nervous system: Alert and oriented. No focal neurological deficits. Extremities:  Both upper extremities, fingertips with ulcerations with no infection secondary to Raynaud's phenomenon. Right upper extremity AV fistula with thrill.  Nontender. Inguinal lymph nodes present, right more prominent than left. Scarring with some edema right thigh. Bilateral ulcers of the heel, unstageable. Left heel with about 5 cm ulcer, black in color, surrounding erythema and fluctuation no drainage. Left heel with about 2 cm ulcer, tender to palpation, no surrounding erythema or fluctuation.       Data Reviewed: I have personally reviewed following labs and imaging studies  CBC: Recent Labs  Lab 05/12/20 1319 05/13/20 0526  WBC 7.3 7.6  NEUTROABS 5.6 5.8  HGB 10.8* 10.4*  HCT 37.6* 34.3*  MCV 101.6* 97.4  PLT 321 0000000   Basic Metabolic Panel: Recent Labs  Lab 05/12/20 1319 05/13/20 0526  NA 139 136  K 5.0 5.2*  CL 99 98  CO2 24 23  GLUCOSE 96 112*  BUN 41* 45*  CREATININE 9.56* 10.13*  CALCIUM 8.7* 8.5*  MG  --  2.3  PHOS  --  7.6*   GFR: Estimated Creatinine Clearance: 9.5 mL/min (A) (by C-G formula based on SCr of 10.13 mg/dL (H)). Liver Function Tests: Recent Labs  Lab 05/12/20 1319 05/13/20 0526  AST 14* 13*  ALT 13 13  ALKPHOS 261* 215*  BILITOT 0.7 0.8  PROT 6.8 6.6  ALBUMIN 2.4* 2.2*   No results for input(s): LIPASE, AMYLASE in the last 168 hours. No results for input(s): AMMONIA in the last 168 hours. Coagulation Profile: No results for input(s): INR, PROTIME in the last 168 hours. Cardiac Enzymes: No results for input(s): CKTOTAL, CKMB, CKMBINDEX, TROPONINI in the last 168 hours. BNP (last 3 results) No results for input(s): PROBNP in the last 8760  hours. HbA1C: Recent Labs    05/13/20 0526  HGBA1C 7.6*   CBG: Recent Labs  Lab 05/12/20 2318 05/13/20 0335 05/13/20 0733  GLUCAP 73 98 104*   Lipid Profile: No results for input(s): CHOL, HDL, LDLCALC, TRIG, CHOLHDL, LDLDIRECT in the last 72 hours. Thyroid Function Tests: Recent Labs    05/13/20 0526  TSH 0.953   Anemia Panel: Recent Labs    05/13/20 0526  VITAMINB12 245  FOLATE 8.0  FERRITIN 62  TIBC 167*  IRON 20*  RETICCTPCT 2.0   Sepsis Labs: Recent Labs  Lab 05/12/20 1319 05/12/20 2129  LATICACIDVEN 2.0* 1.9    Recent Results (from the past 240 hour(s))  SARS CORONAVIRUS 2 (TAT 6-24 HRS) Nasopharyngeal Nasopharyngeal  Swab     Status: None   Collection Time: 05/12/20  8:38 PM   Specimen: Nasopharyngeal Swab  Result Value Ref Range Status   SARS Coronavirus 2 NEGATIVE NEGATIVE Final    Comment: (NOTE) SARS-CoV-2 target nucleic acids are NOT DETECTED.  The SARS-CoV-2 RNA is generally detectable in upper and lower respiratory specimens during the acute phase of infection. Negative results do not preclude SARS-CoV-2 infection, do not rule out co-infections with other pathogens, and should not be used as the sole basis for treatment or other patient management decisions. Negative results must be combined with clinical observations, patient history, and epidemiological information. The expected result is Negative.  Fact Sheet for Patients: SugarRoll.be  Fact Sheet for Healthcare Providers: https://www.woods-mathews.com/  This test is not yet approved or cleared by the [place] FDA and  has been authorized for detection and/or diagnosis of SARS-CoV-2 by FDA under an Emergency Use Authorization (EUA). This EUA will remain  in effect (meaning this test can be used) for the duration of the COVID-19 declaration under Se ction 564(b)(1) of the Act, 21 U.S.C. section 360bbb-3(b)(1), unless the authorization is  terminated or revoked sooner.  Performed at [facility], [place] [address], [place], [place] [address]   Blood culture (routine x 2)     Status: None (Preliminary result)   Collection Time: 05/12/20 10:06 PM   Specimen: BLOOD  Result Value Ref Range Status   Specimen Description BLOOD LEFT ARM  Final   Special Requests   Final    BOTTLES DRAWN AEROBIC AND ANAEROBIC Blood Culture adequate volume   Culture   Final    NO GROWTH < 12 HOURS Performed at [facility], [place] [address] [place], [place] [address]    Report Status PENDING  Incomplete         Radiology Studies: No results found.      Scheduled Meds: . (feeding supplement) PROSource Plus  30 mL Oral TID BM  . atorvastatin  10 mg Oral QPM  . calcium acetate  1,334 mg Oral BID AC  . Chlorhexidine Gluconate Cloth  6 each Topical Q0600  . cycloSPORINE  1 drop Both Eyes BID  . febuxostat  40 mg Oral QPM  . ferric citrate  420 mg Oral BID AC  . insulin aspart  0-6 Units Subcutaneous Q4H  . midodrine  5 mg Oral Q T,Th,Sa-HD  . multivitamin  1 tablet Oral QHS  . nutrition supplement (JUVEN)  1 packet Oral BID BM  . sodium chloride flush  3 mL Intravenous Q12H  . sucroferric oxyhydroxide  500 mg Oral BID AC   Continuous Infusions: . sodium chloride 250 mL (05/13/20 0537)  . sodium chloride    . sodium chloride    . cefTRIAXone (ROCEPHIN)  IV     And  . metronidazole 500 mg (05/13/20 0540)  . vancomycin 1,000 mg (05/13/20 1352)     LOS: 1 day    Time spent: 32 minutes     Barb Merino, MD [facility] Pager [phone]

## 2020-05-13 NOTE — Progress Notes (Signed)
Inpatient Diabetes Program Recommendations  AACE/ADA: New Consensus Statement on Inpatient Glycemic Control (2015)  Target Ranges:  Prepandial:   less than 140 mg/dL      Peak postprandial:   less than 180 mg/dL (1-2 hours)      Critically ill patients:  140 - 180 mg/dL   Lab Results  Component Value Date   GLUCAP 104 (H) 05/13/2020   HGBA1C 7.6 (H) 05/13/2020    Review of Glycemic Control  Results for JAVAUGHN, GERREN (MRN [id number]) as of 05/13/2020 13:04  Ref. Range 04/13/2019 00:51 01/10/2020 18:20 05/12/2020 23:18 05/13/2020 03:35 05/13/2020 07:33  Glucose-Capillary Latest Ref Range: 70 - 99 mg/dL 193 (H) 114 (H) 73 98 104 (H)   Diabetes history: DM 2 Outpatient Diabetes medications:  Insulin pump- Omnipod Current orders for Inpatient glycemic control:  Novolog 0-6 units q 4 hours  Inpatient Diabetes Program Recommendations:    Called and spoke with RN- She states that patient was in dialysis and just got back.  She is unsure if patient has insulin pump on but states she will assess and get orders from MD if patient is wearing insulin pump.  Blood sugars currently well controlled so it appears he does have insulin pump on.  RN will f/u with patient and then call MD for insulin pump orders.    Thanks  Adah Perl, RN, BC-ADM Inpatient Diabetes Coordinator Pager [phone] (8a-5p)

## 2020-05-13 NOTE — Progress Notes (Signed)
TBI completed    Please see CV Proc for preliminary results.   Vonzell Schlatter, RVT

## 2020-05-13 NOTE — Progress Notes (Addendum)
Hemodialysis- Patient signed off treatment with 12 minutes remaining. Total UF 2.7L without issue. Goal challenged by 0.5L per protocol/patient request due to stable bp.  Received vancomycin with treatment. No complaints. Reported off to primary RN.

## 2020-05-13 NOTE — Consult Note (Signed)
[facility] Renal Consultation Note    Indication for Consultation:  Management of ESRD/hemodialysis; anemia, hypertension/volume and secondary hyperparathyroidism  [id number], Iona Beard, MD  HPI: [name] [name] is a 55 y.o. male with ESRD on HD TTS at [facility].  Past medical history significant for DMT2, HTN, chronic combined systolic and diastolic HF, gout, PAD s/p multiple finger amputations and peripheral neuropathy.  Patient presented to the ED due to throbbing pain from plantar heel wound on his left foot.  Pain has been progressively worsening over the last week.  It is worse with ambulation.  Followed by podiatry.  At his visit with them yesterday they recommended he go to the ED for eval due to the increased pain and ulcer for IV antibiotics and likely surgical intervention.  Admits to pain in bilateral lower legs as well.  Denies CP, SOB, n/v/d, chills, fever, abdominal pain, weakness and fatigue.  Reports dialysis has been going well and he has not missed a treatment.   Pertinent findings in work up include K 5.2, tsat 12%, lactic acid 2.0, BC with NGTD, and MRI L heel completed on 2/16 showed inflammatory changes on plantar aspect of L foot.  Patient has been admitted for further evaluation and management.   Past Medical History:  Diagnosis Date  . Anal infection    posterior anal canal  . Anemia, chronic renal failure   . Atrophic kidney    BILATERAL  . DM type 2 causing ESRD [facility])    Nephrologist-- dr [name] [facility])--  on hemodialysis since June 2012 at  [name]  TTS  . Hemodialysis patient [facility])    at [facility] on Tues/ Thur/Sat/schedule  . Hemorrhoids   . Hepatitis B antibody positive   . History of pleural effusion    bilateral  . Hyperparathyroidism, secondary renal ([place])   . Hypertension   . Ischemic cardiomyopathy    per echo 07-01-2014  ef 45%  . LAFB (left anterior fascicular block)   . Peripheral neuropathy   .  Systolic and diastolic CHF, chronic ([place])    CARDIOLOGIST-  DR Daneen Schick ([facility])  AND DR [name] ([name])   Past Surgical History:  Procedure Laterality Date  . APPENDECTOMY  09-12-2004   laparotomy w/ drainage peritinitis  . AV FISTULA PLACEMENT  02-27-2010   right forearm (RADIOCEPHALIC)  . AV FISTULA REPAIR  10-30-2010  . CARDIOVASCULAR STRESS TEST  10-29-2011   dr Daneen Schick   Low risk scan;  mild perfusion defect seen in the basal inferoseptal, basal inferior and mid inferior regions consistent with an infarct/scar and/or overlying attenuation/  mild to moderate global LVSF,  ef 40-45%  . DOBUTAMINE STRESS ECHO  07-23-2012   [name]   abnormal ;  at rest estimated lvef 25-30% and global severe LV hypokinesis ;  no cp during stress and achieved 85% maxium predicted heart rate;  negative stress ECG for inducible ischemia;  estimated lvef with stress 35-40%;  augmentation of wall segments consistant with cardiomyopathy and differential fibrosis  . FISTULOTOMY N/A 03/26/2015   Procedure: FISTULOTOMY;  Surgeon: [name], MD;  Location: [facility];  Service: General;  Laterality: N/A;  . INCISION AND DRAINAGE ABSCESS N/A 03/26/2015   Procedure: ANAL INCISION AND DRAINAGE;  Surgeon: [name], MD;  Location: [facility];  Service: General;  Laterality: N/A;  . RETINAL DETACHMENT SURGERY Left 2011   incomplete repair/ needs eye drops to keep pressure  down  . TEE WITHOUT CARDIOVERSION N/A 10/17/2017   Procedure: TRANSESOPHAGEAL ECHOCARDIOGRAM (TEE);  Surgeon: Josue Hector, MD;  Location: [facility] ENDOSCOPY;  Service: Cardiovascular;  Laterality: N/A;  . TRANSTHORACIC ECHOCARDIOGRAM  07-01-2014    done at [facility]   grade 1 diastolic dysfunction,  ef 45%/  trace TR and PR   Family History  Problem Relation Age of Onset  . Diabetes Other    Social History:  reports that he has never smoked. He has never  used smokeless tobacco. He reports that he does not drink alcohol and does not use drugs. No Known Allergies Prior to Admission medications   Medication Sig Start Date End Date Taking? Authorizing Provider  aspirin 325 MG tablet Take 325 mg by mouth every evening.   Yes [provider]  atorvastatin (LIPITOR) 10 MG tablet Take 10 mg by mouth every evening.   Yes [provider]  calcium acetate (PHOSLO) 667 MG capsule Take 1,334 mg by mouth See admin instructions. Take 2 capsules (1334 mg) by mouth twice daily - with lunch and supper 06/02/10  Yes [provider]  celecoxib (CELEBREX) 100 MG capsule Take 100 mg by mouth daily as needed (pain). 04/25/19  Yes [provider]  cycloSPORINE, PF, (CEQUA) 0.09 % SOLN Place 1 drop into both eyes 2 (two) times daily.   Yes [provider]  febuxostat (ULORIC) 40 MG tablet Take 40 mg by mouth every evening.   Yes [provider]  ferric citrate (AURYXIA) 1 GM 210 MG(Fe) tablet Take 420 mg by mouth See admin instructions. Take 2 tablets (420 mg) by mouth twice daily - with lunch and supper   Yes [provider]  gabapentin (NEURONTIN) 300 MG capsule Take 300 mg by mouth daily as needed (pain). 05/09/15  Yes [provider]  ibuprofen (ADVIL) 200 MG tablet Take 600 mg by mouth 3 (three) times daily as needed (pain).   Yes [provider]  Insulin Disposable Pump (OMNIPOD DASH 5 PACK PODS) MISC Inject into the skin. Use with Humalog 08/28/19  Yes [provider]  lidocaine (XYLOCAINE) 5 % ointment Apply 1 application topically 2 (two) times daily as needed (finger pain). 07/14/19  Yes [provider]  midodrine (PROAMATINE) 5 MG tablet Take 5 mg by mouth See admin instructions. Take one tablet (5 mg) by mouth Tuesday, Thursday, Saturday before dialysis   Yes [provider]  Multiple Vitamin (MULTIVITAMIN WITH MINERALS) TABS tablet Take 1 tablet by mouth every  evening.   Yes [provider]  mupirocin ointment (BACTROBAN) 2 % APPLY TO AFFECTED AREAS OF BOTH FEET ONCE DAILY. Patient taking differently: Apply 1 application topically See admin instructions. Apply topically to bottom of feet for wound care - once daily 04/22/20  Yes McDonald, Adam R, DPM  ondansetron (ZOFRAN) 4 MG tablet Take 4 mg by mouth 3 (three) times daily as needed for nausea or vomiting. 06/27/19  Yes [provider]  Oxycodone HCl 10 MG TABS Take 10 mg by mouth 3 (three) times daily as needed (severe pain). 09/28/19  Yes [provider]  sucroferric oxyhydroxide (VELPHORO) 500 MG chewable tablet Chew 500 mg by mouth See admin instructions. Chew one tablet (500 mg) by mouth twice daily - with lunch and supper   Yes [provider]  triamcinolone cream (KENALOG) 0.1 % Apply 1 application topically daily as needed (itching). 07/14/19  Yes [provider]  Continuous Blood Gluc Sensor (FREESTYLE LIBRE 14  DAY SENSOR) MISC See admin instructions. 04/08/20   [provider]   Current Facility-Administered Medications  Medication Dose Route Frequency Provider Last Rate Last Admin  . (feeding supplement) PROSource Plus liquid 30 mL  30 mL Oral TID BM Ghimire, Kuber, MD      . 0.9 %  sodium chloride infusion  250 mL Intravenous PRN Doutova, Anastassia, MD 10 mL/hr at 05/13/20 0537 250 mL at 05/13/20 0537  . 0.9 %  sodium chloride infusion  100 mL Intravenous PRN Faizan Geraci, Ria Comment, PA      . 0.9 %  sodium chloride infusion  100 mL Intravenous PRN Teondre Jarosz, Ria Comment, PA      . acetaminophen (TYLENOL) tablet 650 mg  650 mg Oral Q6H PRN Doutova, Anastassia, MD       Or  . acetaminophen (TYLENOL) suppository 650 mg  650 mg Rectal Q6H PRN Doutova, Anastassia, MD      . alteplase (CATHFLO ACTIVASE) injection 2 mg  2 mg Intracatheter Once PRN Moksh Loomer, Ria Comment, PA      . atorvastatin (LIPITOR) tablet 10 mg  10 mg Oral QPM Doutova, Anastassia, MD      .  calcium acetate (PHOSLO) capsule 1,334 mg  1,334 mg Oral BID AC Doutova, Anastassia, MD      . cefTRIAXone (ROCEPHIN) 2 g in sodium chloride 0.9 % 100 mL IVPB  2 g Intravenous Q24H Doutova, Anastassia, MD       And  . metroNIDAZOLE (FLAGYL) IVPB 500 mg  500 mg Intravenous Q8H Doutova, Anastassia, MD 100 mL/hr at 05/13/20 0540 500 mg at 05/13/20 0540  . Chlorhexidine Gluconate Cloth 2 % PADS 6 each  6 each Topical Q0600 Allie Gerhold, Ria Comment, PA      . cycloSPORINE (RESTASIS) 0.05 % ophthalmic emulsion 1 drop  1 drop Both Eyes BID Doutova, Anastassia, MD      . febuxostat (ULORIC) tablet 40 mg  40 mg Oral QPM Doutova, Anastassia, MD      . ferric citrate (AURYXIA) tablet 420 mg  420 mg Oral BID AC Doutova, Anastassia, MD      . gabapentin (NEURONTIN) capsule 300 mg  300 mg Oral Daily PRN Doutova, Anastassia, MD      . heparin injection 1,000 Units  1,000 Units Dialysis PRN Frazer [place], Ria Comment, PA      . HYDROcodone-acetaminophen (NORCO/VICODIN) 5-325 MG per tablet 1-2 tablet  1-2 tablet Oral Q4H PRN Toy Baker, MD   1 tablet at 05/13/20 0216  . insulin aspart (novoLOG) injection 0-6 Units  0-6 Units Subcutaneous Q4H Doutova, Anastassia, MD      . lidocaine (PF) (XYLOCAINE) 1 % injection 5 mL  5 mL Intradermal PRN Sangita Zani, Ria Comment, PA      . lidocaine-prilocaine (EMLA) cream 1 application  1 application Topical PRN Julicia Krieger, Ria Comment, PA      . midodrine (PROAMATINE) tablet 5 mg  5 mg Oral Q T,Th,Sa-HD Doutova, Anastassia, MD      . multivitamin (RENA-VIT) tablet 1 tablet  1 tablet Oral QHS Barb Merino, MD      . nutrition supplement (JUVEN) (JUVEN) powder packet 1 packet  1 packet Oral BID BM Barb Merino, MD      . oxyCODONE (Oxy IR/ROXICODONE) immediate release tablet 10 mg  10 mg Oral TID PRN Toy Baker, MD      . pentafluoroprop-tetrafluoroeth (GEBAUERS) aerosol 1 application  1 application Topical PRN Wash Nienhaus, PA      . sodium chloride flush (NS) 0.9 % injection  3 mL  3 mL Intravenous Q12H Doutova, Anastassia, MD      . sodium chloride flush (NS) 0.9 % injection 3 mL  3 mL Intravenous PRN Doutova, Anastassia, MD      . sucroferric oxyhydroxide (VELPHORO) chewable tablet 500 mg  500 mg Oral BID AC Doutova, Anastassia, MD      . vancomycin (VANCOCIN) IVPB 1000 mg/200 mL premix  1,000 mg Intravenous Q T,Th,Sa-HD Duanne Limerick, RPH 200 mL/hr at 05/13/20 1352 1,000 mg at 05/13/20 1352   Labs: Basic Metabolic Panel: Recent Labs  Lab 05/12/20 1319 05/13/20 0526  NA 139 136  K 5.0 5.2*  CL 99 98  CO2 24 23  GLUCOSE 96 112*  BUN 41* 45*  CREATININE 9.56* 10.13*  CALCIUM 8.7* 8.5*  PHOS  --  7.6*   Liver Function Tests: Recent Labs  Lab 05/12/20 1319 05/13/20 0526  AST 14* 13*  ALT 13 13  ALKPHOS 261* 215*  BILITOT 0.7 0.8  PROT 6.8 6.6  ALBUMIN 2.4* 2.2*   CBC: Recent Labs  Lab 05/12/20 1319 05/13/20 0526  WBC 7.3 7.6  NEUTROABS 5.6 5.8  HGB 10.8* 10.4*  HCT 37.6* 34.3*  MCV 101.6* 97.4  PLT 321 353   CBG: Recent Labs  Lab 05/12/20 2318 05/13/20 0335 05/13/20 0733  GLUCAP 73 98 104*   Iron Studies:  Recent Labs    05/13/20 0526  IRON 20*  TIBC 167*  FERRITIN 62   Studies/Results: No results found.  ROS: All others negative except those listed in HPI.  Physical Exam: Vitals:   05/13/20 1230 05/13/20 1300 05/13/20 1330 05/13/20 1400  BP: (!) 142/97 135/65 101/63 136/70  Pulse:      Resp: 16     Temp:      TempSrc:      SpO2:      Weight:      Height:         General: Well developed male in NAD Head: NCAT sclera not icteric  Neck: Supple. No JVD Lungs: CTA bilaterally. No wheeze, rales or rhonchi. Breathing is unlabored. Heart: RRR. No murmur, rubs or gallops.  Abdomen: soft, nontender, +BS, no guarding, no rebound tenderness Lower extremities:1+ edema b/l, +tenderness to palpation of b/l calves Neuro: AAOx3. Moves all extremities spontaneously. Psych:  Responds to questions appropriately with a  normal affect. Dialysis Access: RU AVF in use  Dialysis Orders: Orders per chart review documented on 04/29/20 TTS - [facility]  3.5 hrs, BFR 400, DFR 800,  EDW 236kg, 2K/ 3Ca  Access: LU AVF  Heparin low dose protocol   Requesting records from home clinic for additional medication information.   Assessment/Plan: 1. Diabetic foot wounds - suspected osteomyelitis on MRI 2/16.  Failed outpatient antibiotic therapy.  ON IV vanc, rocephin and flagyl.  BC pending.  Debridement planned for tomorrow per podiatry.   2.  ESRD -  On HD TTS.  Orders written for HD today per regular schedule.   3.  Hypertension/volume  - Blood pressure well controlled. On midodrine for BP support with HD.  Lower extremity edema noted on exam.  UF as tolerated.  4.  Anemia of CKD - Hgb 10.4. No indication for ESA at this time.  No IV iron in setting of possible osteomyelitis.  Follow labs post surgery.  Requesting records from home clinic for additional medication information.  5.  Secondary Hyperparathyroidism -  Calcium in goal.  Phos elevated.  Continue binders.  Requesting records from home clinic for additional medication  information.  6.  Nutrition - Renal diet w/fluid restrictions.  7. DMT2 - on insulin, per primary 8. Gout - on uloric.  Jen Mow, PA-C [employer] 05/13/2020, 2:29 PM

## 2020-05-14 ENCOUNTER — Inpatient Hospital Stay (HOSPITAL_COMMUNITY): Payer: Medicare Other | Admitting: Anesthesiology

## 2020-05-14 ENCOUNTER — Encounter (HOSPITAL_COMMUNITY)
Admission: EM | Disposition: A | Discharge status: Home Health | Payer: Self-pay | Source: Home / Self Care | Attending: Internal Medicine

## 2020-05-14 ENCOUNTER — Encounter (HOSPITAL_COMMUNITY): Payer: Self-pay | Admitting: Internal Medicine

## 2020-05-14 DIAGNOSIS — I70234 Atherosclerosis of native arteries of right leg with ulceration of heel and midfoot: Secondary | ICD-10-CM

## 2020-05-14 DIAGNOSIS — L97524 Non-pressure chronic ulcer of other part of left foot with necrosis of bone: Secondary | ICD-10-CM

## 2020-05-14 DIAGNOSIS — I70244 Atherosclerosis of native arteries of left leg with ulceration of heel and midfoot: Secondary | ICD-10-CM

## 2020-05-14 HISTORY — PX: GRAFT APPLICATION: SHX6696

## 2020-05-14 HISTORY — PX: BONE BIOPSY: SHX375

## 2020-05-14 HISTORY — PX: WOUND DEBRIDEMENT: SHX247

## 2020-05-14 LAB — GLUCOSE, CAPILLARY
Glucose-Capillary: 106 mg/dL — ABNORMAL HIGH (ref 70–99)
Glucose-Capillary: 168 mg/dL — ABNORMAL HIGH (ref 70–99)
Glucose-Capillary: 193 mg/dL — ABNORMAL HIGH (ref 70–99)

## 2020-05-14 LAB — SURGICAL PCR SCREEN
MRSA, PCR: NEGATIVE
Staphylococcus aureus: NEGATIVE

## 2020-05-14 LAB — POCT I-STAT, CHEM 8
BUN: 34 mg/dL — ABNORMAL HIGH (ref 6–20)
Calcium, Ion: 0.97 mmol/L — ABNORMAL LOW (ref 1.15–1.40)
Chloride: 102 mmol/L (ref 98–111)
Creatinine, Ser: 8.3 mg/dL — ABNORMAL HIGH (ref 0.61–1.24)
Glucose, Bld: 131 mg/dL — ABNORMAL HIGH (ref 70–99)
HCT: 35 % — ABNORMAL LOW (ref 39.0–52.0)
Hemoglobin: 11.9 g/dL — ABNORMAL LOW (ref 13.0–17.0)
Potassium: 4.6 mmol/L (ref 3.5–5.1)
Sodium: 137 mmol/L (ref 135–145)
TCO2: 24 mmol/L (ref 22–32)

## 2020-05-14 SURGERY — IRRIGATION AND DEBRIDEMENT EXTREMITY
Anesthesia: Choice | Laterality: Left

## 2020-05-14 SURGERY — DEBRIDEMENT, WOUND
Anesthesia: Regional | Site: Foot | Laterality: Left

## 2020-05-14 MED ORDER — FERRIC CITRATE 1 GM 210 MG(FE) PO TABS
210.0000 mg | ORAL_TABLET | Freq: Two times a day (BID) | ORAL | Status: DC | PRN
  Filled 2020-05-14: qty 1

## 2020-05-14 MED ORDER — CHLORHEXIDINE GLUCONATE 0.12 % MT SOLN
OROMUCOSAL | Status: AC
  Administered 2020-05-14: 15 mL via OROMUCOSAL
  Filled 2020-05-14: qty 15

## 2020-05-14 MED ORDER — MIDODRINE HCL 5 MG PO TABS
10.0000 mg | ORAL_TABLET | ORAL | Status: DC
  Administered 2020-05-15: 10 mg via ORAL
  Filled 2020-05-14: qty 2

## 2020-05-14 MED ORDER — CINACALCET HCL 30 MG PO TABS
30.0000 mg | ORAL_TABLET | Freq: Every day | ORAL | Status: DC
  Administered 2020-05-15: 30 mg via ORAL
  Filled 2020-05-14 (×3): qty 1

## 2020-05-14 MED ORDER — VANCOMYCIN HCL 500 MG IV SOLR
INTRAVENOUS | Status: AC
  Filled 2020-05-14: qty 500

## 2020-05-14 MED ORDER — TOBRAMYCIN SULFATE 1.2 G IJ SOLR
INTRAMUSCULAR | Status: AC
  Filled 2020-05-14: qty 1.2

## 2020-05-14 MED ORDER — SODIUM CHLORIDE 0.9 % IV SOLN: INTRAVENOUS | Status: DC

## 2020-05-14 MED ORDER — MIDAZOLAM HCL 2 MG/2ML IJ SOLN: 1.0000 mg | Freq: Once | INTRAMUSCULAR | Status: AC

## 2020-05-14 MED ORDER — ROPIVACAINE HCL 7.5 MG/ML IJ SOLN
INTRAMUSCULAR | Status: DC | PRN
  Administered 2020-05-14: 20 mL via PERINEURAL

## 2020-05-14 MED ORDER — FENTANYL CITRATE (PF) 100 MCG/2ML IJ SOLN: 50.0000 ug | Freq: Once | INTRAMUSCULAR | Status: AC

## 2020-05-14 MED ORDER — PROPOFOL 500 MG/50ML IV EMUL
INTRAVENOUS | Status: DC | PRN
  Administered 2020-05-14: 150 ug/kg/min via INTRAVENOUS

## 2020-05-14 MED ORDER — MEPIVACAINE HCL (PF) 1.5 % IJ SOLN
INTRAMUSCULAR | Status: DC | PRN
  Administered 2020-05-14: 10 mL via PERINEURAL

## 2020-05-14 MED ORDER — FENTANYL CITRATE (PF) 250 MCG/5ML IJ SOLN
INTRAMUSCULAR | Status: AC
  Filled 2020-05-14: qty 5

## 2020-05-14 MED ORDER — CALCIUM ACETATE (PHOS BINDER) 667 MG PO CAPS
2668.0000 mg | ORAL_CAPSULE | Freq: Three times a day (TID) | ORAL | Status: DC
  Administered 2020-05-15 – 2020-05-16 (×4): 2668 mg via ORAL
  Filled 2020-05-14 (×8): qty 4

## 2020-05-14 MED ORDER — FENTANYL CITRATE (PF) 100 MCG/2ML IJ SOLN
INTRAMUSCULAR | Status: AC
  Administered 2020-05-14: 50 ug via INTRAVENOUS
  Filled 2020-05-14: qty 2

## 2020-05-14 MED ORDER — PROPOFOL 10 MG/ML IV BOLUS
INTRAVENOUS | Status: AC
  Filled 2020-05-14: qty 20

## 2020-05-14 MED ORDER — MIDAZOLAM HCL 2 MG/2ML IJ SOLN
INTRAMUSCULAR | Status: AC
  Filled 2020-05-14: qty 2

## 2020-05-14 MED ORDER — CHLORHEXIDINE GLUCONATE CLOTH 2 % EX PADS
6.0000 | MEDICATED_PAD | Freq: Every day | CUTANEOUS | Status: DC
  Administered 2020-05-16: 6 via TOPICAL

## 2020-05-14 MED ORDER — MIDAZOLAM HCL 2 MG/2ML IJ SOLN
INTRAMUSCULAR | Status: AC
  Administered 2020-05-14: 1 mg via INTRAVENOUS
  Filled 2020-05-14: qty 2

## 2020-05-14 MED ORDER — ORAL CARE MOUTH RINSE: 15.0000 mL | Freq: Once | OROMUCOSAL | Status: AC

## 2020-05-14 MED ORDER — SODIUM CHLORIDE 0.9 % IR SOLN
Status: DC | PRN
  Administered 2020-05-14 (×2): 1000 mL

## 2020-05-14 MED ORDER — PROPOFOL 1000 MG/100ML IV EMUL
INTRAVENOUS | Status: AC
  Filled 2020-05-14: qty 100

## 2020-05-14 MED ORDER — CHLORHEXIDINE GLUCONATE 0.12 % MT SOLN: 15.0000 mL | Freq: Once | OROMUCOSAL | Status: AC

## 2020-05-14 SURGICAL SUPPLY — 57 items
APL PRP STRL LF DISP 70% ISPRP (MISCELLANEOUS) ×1
BLADE SAW SGTL 83.5X18.5 (BLADE) ×1 IMPLANT
BLADE SURG 15 STRL LF DISP TIS (BLADE) ×3 IMPLANT
BLADE SURG 15 STRL SS (BLADE) ×3
BNDG CMPR 9X4 STRL LF SNTH (GAUZE/BANDAGES/DRESSINGS)
BNDG COHESIVE 6X5 TAN STRL LF (GAUZE/BANDAGES/DRESSINGS) IMPLANT
BNDG ELASTIC 4X5.8 VLCR STR LF (GAUZE/BANDAGES/DRESSINGS) ×2 IMPLANT
BNDG ESMARK 4X9 LF (GAUZE/BANDAGES/DRESSINGS) ×1 IMPLANT
BNDG GAUZE ELAST 4 BULKY (GAUZE/BANDAGES/DRESSINGS) ×4 IMPLANT
BOWL SMART MIX CTS (DISPOSABLE) IMPLANT
CHLORAPREP W/TINT 26 (MISCELLANEOUS) ×3 IMPLANT
COVER SURGICAL LIGHT HANDLE (MISCELLANEOUS) ×3 IMPLANT
DRAPE U-SHAPE 47X51 STRL (DRAPES) ×3 IMPLANT
DRSG EMULSION OIL 3X3 NADH (GAUZE/BANDAGES/DRESSINGS) ×2 IMPLANT
DRSG PAD ABDOMINAL 8X10 ST (GAUZE/BANDAGES/DRESSINGS) ×3 IMPLANT
ELECT CAUTERY BLADE 6.4 (BLADE) ×1 IMPLANT
ELECT REM PT RETURN 9FT ADLT (ELECTROSURGICAL) ×3
ELECTRODE REM PT RTRN 9FT ADLT (ELECTROSURGICAL) IMPLANT
GAUZE SPONGE 4X4 12PLY STRL (GAUZE/BANDAGES/DRESSINGS) ×3 IMPLANT
GAUZE XEROFORM 1X8 LF (GAUZE/BANDAGES/DRESSINGS) ×1 IMPLANT
GLOVE BIO SURGEON STRL SZ8 (GLOVE) ×3 IMPLANT
GLOVE SRG 8 PF TXTR STRL LF DI (GLOVE) ×1 IMPLANT
GLOVE SURG UNDER POLY LF SZ8 (GLOVE) ×3
GOWN STRL REUS W/ TWL LRG LVL3 (GOWN DISPOSABLE) ×2 IMPLANT
GOWN STRL REUS W/TWL LRG LVL3 (GOWN DISPOSABLE) ×6
HANDPIECE INTERPULSE COAX TIP (DISPOSABLE) ×3
KIT BASIN OR (CUSTOM PROCEDURE TRAY) ×3 IMPLANT
KIT TURNOVER KIT B (KITS) ×3 IMPLANT
MANIFOLD NEPTUNE II (INSTRUMENTS) ×1 IMPLANT
MATRIX WOUND 3-LAYER 5X5 (Tissue) ×1 IMPLANT
MICROMATRIX 500MG (Tissue) ×3 IMPLANT
NDL BIOPSY JAMSHIDI 8X6 (NEEDLE) IMPLANT
NDL HYPO 25GX1X1/2 BEV (NEEDLE) ×1 IMPLANT
NEEDLE BIOPSY JAMSHIDI 8X6 (NEEDLE) ×3 IMPLANT
NEEDLE HYPO 25GX1X1/2 BEV (NEEDLE) IMPLANT
NS IRRIG 1000ML POUR BTL (IV SOLUTION) ×3 IMPLANT
PACK ORTHO EXTREMITY (CUSTOM PROCEDURE TRAY) ×3 IMPLANT
PAD ARMBOARD 7.5X6 YLW CONV (MISCELLANEOUS) ×6 IMPLANT
PAD CAST 4YDX4 CTTN HI CHSV (CAST SUPPLIES) ×1 IMPLANT
PADDING CAST COTTON 4X4 STRL (CAST SUPPLIES)
PADDING CAST COTTON 6X4 STRL (CAST SUPPLIES) ×1 IMPLANT
PROBE DEBRIDE SHARPVAC MISONIX (TIP) IMPLANT
SET HNDPC FAN SPRY TIP SCT (DISPOSABLE) IMPLANT
SOL PREP POV-IOD 4OZ 10% (MISCELLANEOUS) ×2 IMPLANT
SOLUTION PARTIC MCRMTRX 500MG (Tissue) IMPLANT
STAPLER VISISTAT 35W (STAPLE) ×3 IMPLANT
STOCKINETTE IMPERVIOUS 9X36 MD (GAUZE/BANDAGES/DRESSINGS) ×3 IMPLANT
SUT PROLENE 3 0 PS 2 (SUTURE) ×3 IMPLANT
SWAB CULTURE LIQ STUART DBL (MISCELLANEOUS) ×1 IMPLANT
SWAB CULTURE LIQUID MINI MALE (MISCELLANEOUS) ×1 IMPLANT
SYR CONTROL 10ML LL (SYRINGE) ×1 IMPLANT
TOWEL GREEN STERILE (TOWEL DISPOSABLE) ×3 IMPLANT
TOWEL GREEN STERILE FF (TOWEL DISPOSABLE) ×3 IMPLANT
TUBE CONNECTING 12'X1/4 (SUCTIONS) ×1
TUBE CONNECTING 12X1/4 (SUCTIONS) ×2 IMPLANT
WOUND MATRIX 3-LAYER 5X5 (Tissue) ×1 IMPLANT
YANKAUER SUCT BULB TIP NO VENT (SUCTIONS) ×3 IMPLANT

## 2020-05-14 NOTE — Op Note (Signed)
PATIENT:  [name] [name]  55 y.o. male  PRE-OPERATIVE DIAGNOSIS:  ulcer, osteomyelitis  POST-OPERATIVE DIAGNOSIS:  * No post-op diagnosis entered *  PROCEDURE:  Procedure(s): DEBRIDEMENT WOUND (Left) BONE BIOPSY (Left)  SURGEON:  Surgeon(s) and Role:    * Trula Slade, DPM - Primary  PHYSICIAN ASSISTANT:   ASSISTANTS: none   ANESTHESIA:   regional and IV sedation  EBL:  10cc    BLOOD ADMINISTERED:none  DRAINS: none   LOCAL MEDICATIONS USED:  NONE  SPECIMEN:  Skin for pathology, bone culture and pathology (calcaneous)  DISPOSITION OF SPECIMEN:  PATHOLOGY  COUNTS:  YES  TOURNIQUET:  * No tourniquets in log *  DICTATION: .Dragon Dictation  PLAN OF CARE: Admit to inpatient   PATIENT DISPOSITION:  PACU - hemodynamically stable.   Delay start of Pharmacological VTE agent (>24hrs) due to surgical blood loss or risk of bleeding: no  Indication for surgery: 55 year old male has been seen in the clinic by another provider for left heel ulceration.  MRI was performed which did reveal ulceration with concern for osteomyelitis.  Given the draining wound I recommend admission to the hospital where he was admitted and started on IV antibiotics.  Vascular surgery was also consulted and found to have adequate circulation to heal the wound.  I discussed incision as well as postoperative course.  We discussed alternatives, risks, complications.  No promises or guarantees were given all questions were answered the best my ability.  Procedure in detail: The patient was both verbally and visually identified by myself and nursing staff and the anesthesia staff.  Leg block was performed by anesthesia preoperatively.  He was transferred the operating room via stretcher and placed on the table in supine position.  After adequate plane of anesthesia was obtained the left lower extremities and scrubbed, prepped, draped in normal sterile fashion.  Timeout was performed.  I  utilized a #15 with scalpel to excise the necrotic wound on the plantar aspect of the heel.  There was found to be an area of inflammatory bulbous tissue present plantar this which was excised with the wound.  This was all sent to pathology.  After excision of the wound there was some necrotic fat which I removed with a rongeur as well as a 15 by scalpel.  The wound did come close to bone but no calcaneus was exposed.  The wound edges were bleeding.  There is no proximal tracking noted and there is no purulence.  At this time I utilized a Jamshidi needle in order to remove bone from the calcaneus which was sent to pathology as well as for culture.  After the wound was adequately debrided and hemostasis achieved I irrigated the wound with saline with pulse lavage with 1 L of saline.  At this time a Cytal 5 x 5 cm graft was applied to the wound and secured in place with staples.  500 mL of micromatrix was placed under the graft as well into the wound bed.  Adaptic was applied followed by petroleum jelly and a dry sterile dressing.  At this time is woken from anesthesia and found to tolerate the procedure well and complications.  He was transferred to PACU about insulin vascular status intact.  He will remain inpatient continue on antibiotics for now.  Await cultures and likely x-rays with infectious disease pending cultures to treat for osteomyelitis.  We will hold off on changing the bandage until Monday.

## 2020-05-14 NOTE — Anesthesia Preprocedure Evaluation (Addendum)
Anesthesia Evaluation  Patient identified by MRN, date of birth, ID band Patient awake    Reviewed: Allergy & Precautions, NPO status , Patient's Chart, lab work & pertinent test results  Airway Mallampati: II  TM Distance: >3 FB Neck ROM: Full    Dental  (+) Dental Advisory Given, Poor Dentition, Chipped, Missing   Pulmonary neg pulmonary ROS,    Pulmonary exam normal breath sounds clear to auscultation       Cardiovascular hypertension, + Peripheral Vascular Disease and +CHF  Normal cardiovascular exam+ dysrhythmias  Rhythm:Regular Rate:Normal  Echo 04/24/19: 1. Left ventricular ejection fraction, by visual estimation, is 40 to 45%. The left ventricle has mildly decreased function. There is mildly increased left ventricular hypertrophy.  2. Elevated left atrial and left ventricular end-diastolic pressures.  3. Left ventricular diastolic parameters are consistent with Grade II diastolic dysfunction (pseudonormalization).  4. The left ventricle demonstrates global hypokinesis.  5. Global right ventricle was not well visualized.The right ventricular size is not well visualized. Right vetricular wall thickness was not assessed.  6. Left atrial size was normal.  7. Right atrial size was normal.  8. Mild mitral annular calcification.  9. The mitral valve is abnormal. Trivial mitral valve regurgitation.  10. The tricuspid valve is grossly normal.  11. The tricuspid valve is grossly normal. Tricuspid valve regurgitation is mild.  12. Aortic valve mean gradient measures 9.0 mmHg.  13. Aortic valve peak gradient measures 17.3 mmHg.  14. The aortic valve is tricuspid. Aortic valve regurgitation is not visualized. Mild aortic valve sclerosis without stenosis.  15. The pulmonic valve was not well visualized. Pulmonic valve regurgitation is not visualized.  16. The interatrial septum was not well visualized.    Neuro/Psych PSYCHIATRIC  DISORDERS Depression  Neuromuscular disease    GI/Hepatic negative GI ROS, (+) Hepatitis -, B  Endo/Other  diabetes, Type 2, Insulin DependentObesity   Renal/GU ESRF and DialysisRenal disease (TTS)     Musculoskeletal ulcer, osteomyelitis Raynaud's phenomenon    Abdominal   Peds  Hematology  (+) Blood dyscrasia, anemia ,   Anesthesia Other Findings Day of surgery medications reviewed with the patient.  Reproductive/Obstetrics                           Anesthesia Physical Anesthesia Plan  ASA: III  Anesthesia Plan: Regional   Post-op Pain Management:    Induction: Intravenous  PONV Risk Score and Plan: 1 and Propofol infusion and Treatment may vary due to age or medical condition  Airway Management Planned: Simple Face Mask and Natural Airway  Additional Equipment:   Intra-op Plan:   Post-operative Plan:   Informed Consent: I have reviewed the patients History and Physical, chart, labs and discussed the procedure including the risks, benefits and alternatives for the proposed anesthesia with the patient or authorized representative who has indicated his/her understanding and acceptance.     Dental advisory given  Plan Discussed with: CRNA  Anesthesia Plan Comments:         Anesthesia Quick Evaluation

## 2020-05-14 NOTE — Plan of Care (Signed)

## 2020-05-14 NOTE — Progress Notes (Signed)
Seen in pre-op. Scheduled for left heel wound debridement, bone biopsy and possible graft application. We went through the procedure and post-op course. All alternatives, risks, complications discussed. Surgical consent signed. Discussed possible serial debridements given the wound.

## 2020-05-14 NOTE — Anesthesia Procedure Notes (Signed)
Anesthesia Regional Block: Popliteal block   Pre-Anesthetic Checklist: ,, timeout performed, Correct Patient, Correct Site, Correct Laterality, Correct Procedure, Correct Position, site marked, Risks and benefits discussed,  Surgical consent,  Pre-op evaluation,  At surgeon's request and post-op pain management  Laterality: Left  Prep: chloraprep       Needles:  Injection technique: Single-shot  Needle Type: Echogenic Needle     Needle Length: 9cm  Needle Gauge: 21     Additional Needles:   Procedures:,,,, ultrasound used (permanent image in chart),,,,  Narrative:  Start time: 05/14/2020 4:35 PM End time: 05/14/2020 4:45 PM Injection made incrementally with aspirations every 5 mL.  Performed by: Personally  Anesthesiologist: Catalina Gravel, MD  Additional Notes: No pain on injection. No increased resistance to injection. Injection made in 5cc increments.  Good needle visualization.  Patient tolerated procedure well.

## 2020-05-14 NOTE — Progress Notes (Addendum)
[facility] Progress Note   Subjective:   Patient seen and examined at bedside in room.  Waiting to go to surgery.  Pain currently well controlled.  Feels like LE edema improved.  Denies CP, SOB, n/v/d, abdominal pain, weakness and fatigue.    Objective Vitals:   05/13/20 1400 05/13/20 1445 05/13/20 1958 05/14/20 0607  BP: 136/70 131/60 130/62 124/66  Pulse:   87 84  Resp:   16 18  Temp:  98.8 F (37.1 C) 98.3 F (36.8 C) 97.9 F (36.6 C)  TempSrc:  Oral Oral Oral  SpO2:  100% 100% 96%  Weight:  106.3 kg    Height:       Physical Exam General: Well developed, obese male in NAD Heart:RRR, no mrg Lungs:CTAB, nml WOB on RA Abdomen:soft, NTND Extremities:+woody edema b/l, b/l heels wrapped Dialysis Access: RU AVF +b/t   Filed Weights   05/12/20 2000 05/13/20 1143 05/13/20 1445  Weight: 108.9 kg 109.3 kg 106.3 kg    Intake/Output Summary (Last 24 hours) at 05/14/2020 1109 Last data filed at 05/13/2020 1445 Gross per 24 hour  Intake -  Output 2750 ml  Net -2750 ml    Additional Objective Labs: Basic Metabolic Panel: Recent Labs  Lab 05/12/20 1319 05/13/20 0526  NA 139 136  K 5.0 5.2*  CL 99 98  CO2 24 23  GLUCOSE 96 112*  BUN 41* 45*  CREATININE 9.56* 10.13*  CALCIUM 8.7* 8.5*  PHOS  --  7.6*   Liver Function Tests: Recent Labs  Lab 05/12/20 1319 05/13/20 0526  AST 14* 13*  ALT 13 13  ALKPHOS 261* 215*  BILITOT 0.7 0.8  PROT 6.8 6.6  ALBUMIN 2.4* 2.2*   CBC: Recent Labs  Lab 05/12/20 1319 05/13/20 0526  WBC 7.3 7.6  NEUTROABS 5.6 5.8  HGB 10.8* 10.4*  HCT 37.6* 34.3*  MCV 101.6* 97.4  PLT 321 353   Blood Culture    Component Value Date/Time   SDES BLOOD LEFT ARM 05/13/2020 0526   SPECREQUEST  05/13/2020 0526    BOTTLES DRAWN AEROBIC ONLY Blood Culture adequate volume   CULT  05/13/2020 0526    NO GROWTH 1 DAY Performed at [facility], [place] [address] [place], [place] [address]    REPTSTATUS PENDING 05/13/2020  0526   CBG: Recent Labs  Lab 05/13/20 0733 05/13/20 1616 05/13/20 2156 05/14/20 0053 05/14/20 0509  GLUCAP 104* 117* 161* 168* 193*   Iron Studies:  Recent Labs    05/13/20 0526  IRON 20*  TIBC 167*  FERRITIN 62   Lab Results  Component Value Date   INR 1.0 04/12/2019   INR 1.00 10/13/2017   INR 0.99 03/13/2016   Studies/Results: DG Foot Complete Right  Result Date: 05/14/2020 CLINICAL DATA:  Right foot ulcer EXAM: RIGHT FOOT COMPLETE - 3+ VIEW COMPARISON:  Radiographs 02/08/2020 FINDINGS: Question some early ulcerative change along the plantar aspect of the right heel. Finding on a background diffuse edematous changes of the right lower extremity with extensive vascular calcifications. No soft tissue gas or foreign body. No radiographic evidence of osteomyelitis subjacent to this site or elsewhere in the foot. Remote posttraumatic deformity of the distal fifth metatarsal head neck junction No acute bony abnormality. Specifically, no fracture, subluxation, or dislocation. IMPRESSION: 1. Question some early ulcerative change along the plantar aspect of the right heel. No soft tissue gas or foreign body. No radiographic evidence of osteomyelitis. 2. Diffuse edematous changes of the right  lower extremity. 3. Remote posttraumatic deformity of the fifth metatarsal. No acute osseous abnormality. Electronically Signed   By: Lovena Le M.D.   On: 05/14/2020 00:29   VAS [place] ABI WITH/WO TBI  Result Date: 05/14/2020 LOWER EXTREMITY DOPPLER STUDY Indications: Peripheral artery disease.  Comparison Study: Known Non-compressible ABI's 1.6 bilaterally in 2021 Performing Technologist: Vonzell Schlatter RVT  Examination Guidelines: A complete evaluation includes at minimum, Doppler waveform signals and systolic blood pressure reading at the level of bilateral brachial, anterior tibial, and posterior tibial arteries, when vessel segments are accessible. Bilateral testing is considered an integral part of  a complete examination. Photoelectric Plethysmograph (PPG) waveforms and toe systolic pressure readings are included as required and additional duplex testing as needed. Limited examinations for reoccurring indications may be performed as noted.  ABI Findings: +---------+------------------+-----+--------+--------+ Right    Rt Pressure (mmHg)IndexWaveformComment  +---------+------------------+-----+--------+--------+ Brachial                                AVF      +---------+------------------+-----+--------+--------+ Great Toe83                0.56 Normal           +---------+------------------+-----+--------+--------+ +---------+------------------+-----+---------+---------------------------------+ Left     Lt Pressure (mmHg)IndexWaveform Comment                           +---------+------------------+-----+---------+---------------------------------+ Brachial 148                    triphasic                                  +---------+------------------+-----+---------+---------------------------------+ Great Toe111               0.75 Normal   mildly dampened compared to right                                          great toe                         +---------+------------------+-----+---------+---------------------------------+ +-------+-----------+-----------+------------+------------+ ABI/TBIToday's ABIToday's TBIPrevious ABIPrevious TBI +-------+-----------+-----------+------------+------------+ Right             .56                    .72          +-------+-----------+-----------+------------+------------+ Left              .75                    .92          +-------+-----------+-----------+------------+------------+ Right TBIs appear decreased. Left TBIs appear decreased.  Summary: Right: The right toe-brachial index is abnormal. Left: The left toe-brachial index is normal.  *See table(s) above for measurements and observations.  Electronically  signed by Deitra Mayo MD on 05/13/2020 at 6:24:49 PM.   Final (Updated)     Medications: . sodium chloride 250 mL (05/14/20 0517)  . cefTRIAXone (ROCEPHIN)  IV 2 g (05/13/20 1717)   And  . metronidazole 500 mg (05/14/20 0518)  . vancomycin 1,000 mg (05/13/20 1352)   . (feeding supplement) PROSource Plus  30 mL Oral  TID BM  . atorvastatin  10 mg Oral QPM  . calcium acetate  1,334 mg Oral BID AC  . Chlorhexidine Gluconate Cloth  6 each Topical Q0600  . cycloSPORINE  1 drop Both Eyes BID  . febuxostat  40 mg Oral QPM  . ferric citrate  420 mg Oral BID AC  . insulin aspart  0-6 Units Subcutaneous Q4H  . midodrine  5 mg Oral Q T,Th,Sa-HD  . multivitamin  1 tablet Oral QHS  . nutrition supplement (JUVEN)  1 packet Oral BID BM  . sodium chloride flush  3 mL Intravenous Q12H  . sucroferric oxyhydroxide  500 mg Oral BID AC    Dialysis Orders: TTS - [facility]  3.5 hrs, BFR 400, DFR 600,  EDW 107kg, 2K/ 2.5Ca  Access: LU AVF  Hep 6000 loading, 500 qhr Hect 4 qHD Aranesp 70 qThurs Venofer '200mg'$  qHD   Assessment/Plan: 1. Diabetic foot wounds - suspected osteomyelitis on MRI (2/16) of plantar aspect of L heel.  Xray R foot with no evidence OM,?early ulcerative changes.  Failed outpatient antibiotic therapy.  BC with NGTD.  On IV vanc, rocephin and flagyl.  B/l TBI decreased.  Podiatry recommends VVS consult.  Debridement/bone Bx planned for this evening per podiatry.   2.  ESRD -  On HD TTS.  Orders written for HD tomorrow per regular schedule.   3.  Hypertension/volume  - Blood pressure well controlled. On midodrine for BP support with HD.  Lower extremity edema noted on exam.  Continue to UF as tolerated.  4.  Anemia of CKD - Hgb 10.4. Continue weekly ESA dose. Hold IV iron in setting of possible osteomyelitis.  Follow labs post surgery.  Requesting records from home clinic for additional medication information.  5.  Secondary Hyperparathyroidism -  Calcium in goal.  Phos elevated.  Continue VDRA, sensipar '30mg'$  qd, and Auryxia 2AC TID, 1 with snacks and phos low 4AC TID.  6.  Nutrition - Renal diet w/fluid restrictions.  7. DMT2 - on insulin, per primary 8. Gout - on [employer], PA-C [place] 05/14/2020,11:09 AM  LOS: 2 days

## 2020-05-14 NOTE — Anesthesia Procedure Notes (Signed)
Procedure Name: MAC Date/Time: 05/14/2020 5:23 PM Performed by: Barrington Ellison, CRNA Pre-anesthesia Checklist: Patient identified, Emergency Drugs available, Suction available, Patient being monitored and Timeout performed Patient Re-evaluated:Patient Re-evaluated prior to induction Oxygen Delivery Method: Simple face mask

## 2020-05-14 NOTE — Plan of Care (Signed)
  Problem: Education: Goal: Knowledge of General Education information will improve Description: Including pain rating scale, medication(s)/side effects and non-pharmacologic comfort measures Outcome: Progressing   Problem: Health Behavior/Discharge Planning: Goal: Ability to manage health-related needs will improve Outcome: Progressing   Problem: Clinical Measurements: Goal: Ability to maintain clinical measurements within normal limits will improve Outcome: Progressing   Problem: Activity: Goal: Risk for activity intolerance will decrease Outcome: Progressing   Problem: Nutrition: Goal: Adequate nutrition will be maintained Outcome: Progressing   Problem: Pain Managment: Goal: General experience of comfort will improve Outcome: Progressing   Problem: Safety: Goal: Ability to remain free from injury will improve Outcome: Progressing    Pt vital signs remained stable overnight. Remained NPO, CHG bath given. Pt had BM. Will continue to monitor

## 2020-05-14 NOTE — Transfer of Care (Signed)
Immediate Anesthesia Transfer of Care Note  Patient: [name] [name]  Procedure(s) Performed: DEBRIDEMENT WOUND (Left ) BONE BIOPSY (Left )  Patient Location: PACU  Anesthesia Type:MAC and Regional  Level of Consciousness: awake, alert  and oriented  Airway & Oxygen Therapy: Patient Spontanous Breathing  Post-op Assessment: Report given to RN  Post vital signs: Reviewed and stable  Last Vitals:  Vitals Value Taken Time  BP 116/72 05/14/20 1815  Temp    Pulse 94 05/14/20 1815  Resp 18 05/14/20 1815  SpO2 99 % 05/14/20 1815  Vitals shown include unvalidated device data.  Last Pain:  Vitals:   05/14/20 1549  TempSrc: Oral  PainSc:          Complications: No complications documented.

## 2020-05-14 NOTE — H&P (Signed)
Anesthesia H&P Update: History and Physical Exam reviewed; patient is OK for planned anesthetic and procedure. ? ?

## 2020-05-14 NOTE — Progress Notes (Signed)
Inpatient Diabetes Program Recommendations  AACE/ADA: New Consensus Statement on Inpatient Glycemic Control (2015)  Target Ranges:  Prepandial:   less than 140 mg/dL      Peak postprandial:   less than 180 mg/dL (1-2 hours)      Critically ill patients:  140 - 180 mg/dL   Lab Results  Component Value Date   GLUCAP 193 (H) 05/14/2020   HGBA1C 7.6 (H) 05/13/2020    Review of Glycemic Control  Diabetes history: DM 2 (Sees Dr. Chalmers Cater, Endocrinology) Outpatient Diabetes medications: omnipod insulin pump Current orders for Inpatient glycemic control:  Novolog 0-6 units Q4 hours  Inpatient Diabetes Program Recommendations:    Pt took off Omnipod insulin pump yesterday evening. Pt has plans to put his pump back on once he returns home where his supplies are. Pt currently on Novolog Correction scale glucose controlled.   Pt also has freestyle Libre sensor placed on left arm due to be changed next Tuesday.  Discussed plans with pt in case he were to get steroids during procedure. Pt knows to contact Endocrinology if glucose trends become elevated once he returns home.   Thanks,  Tama Headings RN, MSN, BC-ADM Inpatient Diabetes Coordinator Team Pager [phone] (8a-5p)

## 2020-05-14 NOTE — Consult Note (Signed)
Vascular and Vein Specialist   Patient name: [name] [name] MRN: [id number] DOB: 1965/07/17 Sex: male   HPI: [name] [name] is a 55 y.o. male seen in consultation regarding arterial flow to both lower extremities. He has a extensive past medical history. His mother is in his room with him today. He presented with worsening ulceration of both heels. This has been followed as an outpatient he was having more discomfort and erythema and was admitted for IV antibiotics and further evaluation. He reports these have been present for approximately 1 month. Initially this was on the left heel and then he had a ulceration develop on his right heel as well. He has no prior history of tissue loss in his lower extremities. He does have a history of severe Raynaud's phenomenon and actually has tissue loss with finger amputations on the hands of both upper extremities. He has a left forearm radiocephalic fistula which has been functional for quite some time. He does not recall any trauma to his feet. He does have a long history of lower extremity edema. Prior ankle arm indices revealed noncompressible vessels. He had noninvasives during this admission as well. He has plan for operative debridement of these lesions with Dr. Jacqualyn Posey in the operating room this evening.    Past Medical History:  Diagnosis Date  . Anal infection    posterior anal canal  . Anemia, chronic renal failure   . Atrophic kidney    BILATERAL  . DM type 2 causing ESRD [facility])    Nephrologist-- dr [name] [facility])--  on hemodialysis since June 2012 at  [name]  TTS  . Hemodialysis patient [facility])    at [place] on Tues/ Thur/Sat/schedule  . Hemorrhoids   . Hepatitis B antibody positive   . History of pleural effusion    bilateral  . Hyperparathyroidism, secondary renal ([place])   . Hypertension   . Ischemic cardiomyopathy    per echo 07-01-2014  ef 45%  . LAFB (left  anterior fascicular block)   . Peripheral neuropathy   . Systolic and diastolic CHF, chronic ([place])    CARDIOLOGIST-  DR Daneen Schick ([place])  AND DR [name] ([name])    Family History  Problem Relation Age of Onset  . Diabetes Other     SOCIAL HISTORY: Social History   Tobacco Use  . Smoking status: Never Smoker  . Smokeless tobacco: Never Used  Substance Use Topics  . Alcohol use: No    No Known Allergies  Current Facility-Administered Medications  Medication Dose Route Frequency Provider Last Rate Last Admin  . (feeding supplement) PROSource Plus liquid 30 mL  30 mL Oral TID BM Barb Merino, MD   30 mL at 05/13/20 1713  . 0.9 %  sodium chloride infusion  250 mL Intravenous PRN Toy Baker, MD 10 mL/hr at 05/14/20 0517 250 mL at 05/14/20 0517  . acetaminophen (TYLENOL) tablet 650 mg  650 mg Oral Q6H PRN Toy Baker, MD       Or  . acetaminophen (TYLENOL) suppository 650 mg  650 mg Rectal Q6H PRN Doutova, Anastassia, MD      . atorvastatin (LIPITOR) tablet 10 mg  10 mg Oral QPM Toy Baker, MD   10  mg at 05/13/20 1713  . calcium acetate (PHOSLO) capsule 1,334 mg  1,334 mg Oral BID AC Doutova, Anastassia, MD   1,334 mg at 05/13/20 1618  . cefTRIAXone (ROCEPHIN) 2 g in sodium chloride 0.9 % 100 mL IVPB  2 g Intravenous Q24H Doutova, Anastassia, MD 200 mL/hr at 05/13/20 1717 2 g at 05/13/20 1717   And  . metroNIDAZOLE (FLAGYL) IVPB 500 mg  500 mg Intravenous Q8H Doutova, Anastassia, MD 100 mL/hr at 05/14/20 0518 500 mg at 05/14/20 0518  . Chlorhexidine Gluconate Cloth 2 % PADS 6 each  6 each Topical Q0600 Penninger, [place], [place]   6 each at 05/14/20 0526  . [START ON 05/15/2020] Chlorhexidine Gluconate Cloth 2 % PADS 6 each  6 each Topical Q0600 Penninger, [place], PA      . cycloSPORINE (RESTASIS) 0.05 % ophthalmic emulsion 1 drop  1 drop Both Eyes BID Doutova, Anastassia, MD      . febuxostat (ULORIC) tablet 40 mg  40  mg Oral QPM Doutova, Anastassia, MD   40 mg at 05/13/20 1713  . ferric citrate (AURYXIA) tablet 420 mg  420 mg Oral BID AC Doutova, Anastassia, MD   420 mg at 05/13/20 1618  . gabapentin (NEURONTIN) capsule 300 mg  300 mg Oral Daily PRN Toy Baker, MD      . HYDROcodone-acetaminophen (NORCO/VICODIN) 5-325 MG per tablet 1-2 tablet  1-2 tablet Oral Q4H PRN Toy Baker, MD   1 tablet at 05/13/20 0216  . insulin aspart (novoLOG) injection 0-6 Units  0-6 Units Subcutaneous Q4H Toy Baker, MD   1 Units at 05/14/20 0512  . midodrine (PROAMATINE) tablet 5 mg  5 mg Oral Q T,Th,Sa-HD Doutova, Anastassia, MD      . multivitamin (RENA-VIT) tablet 1 tablet  1 tablet Oral QHS Barb Merino, MD   1 tablet at 05/13/20 2144  . nutrition supplement (JUVEN) (JUVEN) powder packet 1 packet  1 packet Oral BID BM Barb Merino, MD   1 packet at 05/13/20 1618  . ondansetron (ZOFRAN) tablet 4 mg  4 mg Oral Q8H PRN Trula Slade, DPM   4 mg at 05/13/20 2145  . oxyCODONE (Oxy IR/ROXICODONE) immediate release tablet 10 mg  10 mg Oral TID PRN Toy Baker, MD      . sodium chloride flush (NS) 0.9 % injection 3 mL  3 mL Intravenous Q12H Doutova, Anastassia, MD      . sodium chloride flush (NS) 0.9 % injection 3 mL  3 mL Intravenous PRN Doutova, Anastassia, MD      . sucroferric oxyhydroxide (VELPHORO) chewable tablet 500 mg  500 mg Oral BID AC Doutova, Anastassia, MD   500 mg at 05/13/20 1617  . vancomycin (VANCOCIN) IVPB 1000 mg/200 mL premix  1,000 mg Intravenous Q T,Th,Sa-HD Duanne Limerick, RPH 200 mL/hr at 05/13/20 1352 1,000 mg at 05/13/20 1352    REVIEW OF SYSTEMS:  '[X]'$  denotes positive finding, '[ ]'$  denotes negative finding Cardiac  Comments:  Chest pain or chest pressure:    Shortness of breath upon exertion:    Short of breath when lying flat:    Irregular heart rhythm:        Vascular    Pain in calf, thigh, or hip brought on by ambulation:    Pain in feet at night that  wakes you up from your sleep:     Blood clot in your veins:    Leg swelling:  PHYSICAL EXAM: Vitals:   05/13/20 1400 05/13/20 1445 05/13/20 1958 05/14/20 0607  BP: 136/70 131/60 130/62 124/66  Pulse:   87 84  Resp:   16 18  Temp:  98.8 F (37.1 C) 98.3 F (36.8 C) 97.9 F (36.6 C)  TempSrc:  Oral Oral Oral  SpO2:  100% 100% 96%  Weight:  106.3 kg    Height:        GENERAL: The patient is a well-nourished male, in no acute distress. The vital signs are documented above. CARDIOVASCULAR: Marked edema which appears to be chronic from his knee distally into his feet bilaterally. His feet are warm and well-perfused with no evidence of tissue loss on his distal feet and toes. He does have an unusually located ulceration on both feet. This is more extensive on the left than on the right. It is not in the typical place for pressure ulcer on the heel but is actually on the plantar aspect between the arch and the heel. There is some mild surrounding erythema and this is tender to touch. He does have an easily palpable right dorsalis pedis pulse. I do not palpate pedal pulses on the left.  Hand-held Doppler reveals multiphasic flow in the pedal vessels bilaterally. Most prominent in the left posterior tibial and right dorsalis pedis signal. PULMONARY: There is good air exchange  MUSCULOSKELETAL: There are no major deformities or cyanosis. NEUROLOGIC: No focal weakness or paresthesias are detected. SKIN: Bilateral heel ulcerations as described above PSYCHIATRIC: The patient has a normal affect.  DATA:  MRI shows no abscess. Possible small foreign body on the left and inflammation bilaterally  Noninvasive arterial studies reveal noncompressible vessels. He does have toe brachial index on the left and slightly diminished on the right  MEDICAL ISSUES: Unusual presentation with ulceration on the unusual area of the heel bilaterally. He has normal pulse on the right and normal Doppler  signal on the left. Has normal toe brachial index on the left.  I feel that there is a concern regarding healing due to his chronic renal failure and unusual location of these heel ulcerations. He should have adequate arterial flow for healing and would not recommend any further arterial work-up. Please call if we can assist.    Rosetta Posner, MD [facility] Vascular and Vein Specialists of [facility] [phone]  Note: Portions of this report may have been transcribed using voice recognition software.  Every effort has been made to ensure accuracy; however, inadvertent computerized transcription errors may still be present.

## 2020-05-14 NOTE — Progress Notes (Signed)
PROGRESS NOTE    [name] [name]  [id number] DOB: [birth year] DOA: 05/12/2020 PCP: [name], MD    Brief Narrative:  55 year old gentleman with history of ESRD on hemodialysis, type 2 diabetes on insulin pump recently not using, gout, peripheral neuropathy, patient with Raynaud's disease, diabetic neuropathy presents to the hospital with nonhealing wound of the left foot.  Patient has been suffering from nonhealing wound of the left foot, now has also developed wound on right foot.  Patient recently has been followed by Dr. Earleen Newport and seen outpatient.  Increasing pain and ulceration, he was sent to ER for admission and IV antibiotics.   Assessment & Plan:   Active Problems:   HYPERCHOLESTEROLEMIA   Essential hypertension   Diabetes mellitus type 2, uncontrolled ([place])   ESRD on hemodialysis ([place])   Type II diabetes mellitus with renal manifestations ([place])   Secondary hyperparathyroidism ([place])   Secondary anemia   Osteomyelitis due to type 2 diabetes mellitus ([place])   Diabetic foot ulcer ([place])  Diabetic foot ulcer, bilateral diabetic foot infection, suspected left foot osteomyelitis: MRI 2/16, soft tissue infection, suspected osteomyelitis.  Failed oral antibiotic therapy. Vascular ABI 04/27/19 with noncompressible vessels but adequate perfusion.  No evidence of ischemia. Repeat ABI with similar-appearing chronic vascular disease. Remains on vancomycin, Rocephin and Flagyl.  Blood cultures pending. Patient plan for debridement today.  Surgical cultures will be appreciated. Called and discussed with vascular surgery to evaluate for chronic vascular disease.  Type 2 diabetes with renal complication: Patient apparently on insulin pump and has not used any insulin for last 1 month.  He stated that his blood sugars were normal to low. Currently remains on sliding scale. Difficult finger prick secondary to Raynaud's disease and ulcerations of the fingers, he can use his sensor  from home.  ESRD on hemodialysis: Getting dialysis.  Anemia of chronic disease: Stable.  Hypertension: Blood pressure fairly stable.  Uses midodrine for intradialytic hypotension.     DVT prophylaxis: SCD's Start: 05/13/20 2313 SCDs Start: 05/12/20 2208   Code Status: DNR Family Communication: Mom at bedside. Disposition Plan: Status is: Inpatient  Remains inpatient appropriate because:Inpatient level of care appropriate due to severity of illness   Dispo: The patient is from: Home              Anticipated d/c is to: Home              Anticipated d/c date is: 3 days              Patient currently is not medically stable to d/c.   Difficult to place patient No         Consultants:   Nephrology  Podiatry  Vascular surgery.  Procedures:   None  Antimicrobials:  Antibiotics Given (last 72 hours)    Date/Time Action Medication Dose Rate   05/12/20 2121 New Bag/Given  [by gravity]   cefTRIAXone (ROCEPHIN) 2 g in sodium chloride 0.9 % 100 mL IVPB 2 g 200 mL/hr   05/12/20 2201 New Bag/Given   vancomycin (VANCOREADY) IVPB 2000 mg/400 mL 2,000 mg 200 mL/hr   05/12/20 2258 New Bag/Given   metroNIDAZOLE (FLAGYL) IVPB 500 mg 500 mg 100 mL/hr   05/13/20 0540 New Bag/Given   metroNIDAZOLE (FLAGYL) IVPB 500 mg 500 mg 100 mL/hr   05/13/20 1352 New Bag/Given   vancomycin (VANCOCIN) IVPB 1000 mg/200 mL premix 1,000 mg 200 mL/hr   05/13/20 1717 New Bag/Given   cefTRIAXone (ROCEPHIN) 2 g in sodium chloride  0.9 % 100 mL IVPB 2 g 200 mL/hr   05/13/20 2152 New Bag/Given   metroNIDAZOLE (FLAGYL) IVPB 500 mg 500 mg 100 mL/hr   05/14/20 0518 New Bag/Given   metroNIDAZOLE (FLAGYL) IVPB 500 mg 500 mg 100 mL/hr         Subjective: Patient seen and examined.  Uncomfortable with being n.p.o. since last night.  Otherwise denies any complaints.  Afebrile.  Pain is controlled.  Left foot overall feels better with antibiotics as per patient himself.   Objective: Vitals:    05/13/20 1400 05/13/20 1445 05/13/20 1958 05/14/20 0607  BP: 136/70 131/60 130/62 124/66  Pulse:   87 84  Resp:   16 18  Temp:  98.8 F (37.1 C) 98.3 F (36.8 C) 97.9 F (36.6 C)  TempSrc:  Oral Oral Oral  SpO2:  100% 100% 96%  Weight:  106.3 kg    Height:        Intake/Output Summary (Last 24 hours) at 05/14/2020 1148 Last data filed at 05/13/2020 1445 Gross per 24 hour  Intake --  Output 2750 ml  Net -2750 ml   Filed Weights   05/12/20 2000 05/13/20 1143 05/13/20 1445  Weight: 108.9 kg 109.3 kg 106.3 kg    Examination:  General exam: Appears calm and comfortable  Chronically sick looking but not in any distress. Respiratory system: Clear to auscultation. Respiratory effort normal.  No added sounds. Cardiovascular system: S1 & S2 heard, RRR. Gastrointestinal system: Soft and nontender.   Central nervous system: Alert and oriented. No focal neurological deficits. Extremities:  Both upper extremities, fingertips with ulcerations with no infection secondary to Raynaud's phenomenon. Right upper extremity AV fistula with thrill.  Nontender. Inguinal lymph nodes present, right more prominent than left. Scarring with some edema right thigh. Bilateral ulcers of the heel, unstageable. Left heel with about 5 cm ulcer, black in color, surrounding erythema and fluctuation no drainage. Left heel with about 2 cm ulcer, tender to palpation, no surrounding erythema or fluctuation.       Data Reviewed: I have personally reviewed following labs and imaging studies  CBC: Recent Labs  Lab 05/12/20 1319 05/13/20 0526  WBC 7.3 7.6  NEUTROABS 5.6 5.8  HGB 10.8* 10.4*  HCT 37.6* 34.3*  MCV 101.6* 97.4  PLT 321 0000000   Basic Metabolic Panel: Recent Labs  Lab 05/12/20 1319 05/13/20 0526  NA 139 136  K 5.0 5.2*  CL 99 98  CO2 24 23  GLUCOSE 96 112*  BUN 41* 45*  CREATININE 9.56* 10.13*  CALCIUM 8.7* 8.5*  MG  --  2.3  PHOS  --  7.6*   GFR: Estimated Creatinine  Clearance: 9.4 mL/min (A) (by C-G formula based on SCr of 10.13 mg/dL (H)). Liver Function Tests: Recent Labs  Lab 05/12/20 1319 05/13/20 0526  AST 14* 13*  ALT 13 13  ALKPHOS 261* 215*  BILITOT 0.7 0.8  PROT 6.8 6.6  ALBUMIN 2.4* 2.2*   No results for input(s): LIPASE, AMYLASE in the last 168 hours. No results for input(s): AMMONIA in the last 168 hours. Coagulation Profile: No results for input(s): INR, PROTIME in the last 168 hours. Cardiac Enzymes: No results for input(s): CKTOTAL, CKMB, CKMBINDEX, TROPONINI in the last 168 hours. BNP (last 3 results) No results for input(s): PROBNP in the last 8760 hours. HbA1C: Recent Labs    05/13/20 0526  HGBA1C 7.6*   CBG: Recent Labs  Lab 05/13/20 [id number] 05/13/20 1616 05/13/20 2156 05/14/20 0053 05/14/20 [id number]  GLUCAP 104* 117* 161* 168* 193*   Lipid Profile: No results for input(s): CHOL, HDL, LDLCALC, TRIG, CHOLHDL, LDLDIRECT in the last 72 hours. Thyroid Function Tests: Recent Labs    05/13/20 0526  TSH 0.953   Anemia Panel: Recent Labs    05/13/20 0526  VITAMINB12 245  FOLATE 8.0  FERRITIN 62  TIBC 167*  IRON 20*  RETICCTPCT 2.0   Sepsis Labs: Recent Labs  Lab 05/12/20 1319 05/12/20 2129  LATICACIDVEN 2.0* 1.9    Recent Results (from the past 240 hour(s))  SARS CORONAVIRUS 2 (TAT 6-24 HRS) Nasopharyngeal Nasopharyngeal Swab     Status: None   Collection Time: 05/12/20  8:38 PM   Specimen: Nasopharyngeal Swab  Result Value Ref Range Status   SARS Coronavirus 2 NEGATIVE NEGATIVE Final    Comment: (NOTE) SARS-CoV-2 target nucleic acids are NOT DETECTED.  The SARS-CoV-2 RNA is generally detectable in upper and lower respiratory specimens during the acute phase of infection. Negative results do not preclude SARS-CoV-2 infection, do not rule out co-infections with other pathogens, and should not be used as the sole basis for treatment or other patient management decisions. Negative results must be  combined with clinical observations, patient history, and epidemiological information. The expected result is Negative.  Fact Sheet for Patients: SugarRoll.be  Fact Sheet for Healthcare Providers: https://www.woods-mathews.com/  This test is not yet approved or cleared by the [place] FDA and  has been authorized for detection and/or diagnosis of SARS-CoV-2 by FDA under an Emergency Use Authorization (EUA). This EUA will remain  in effect (meaning this test can be used) for the duration of the COVID-19 declaration under Se ction 564(b)(1) of the Act, 21 U.S.C. section 360bbb-3(b)(1), unless the authorization is terminated or revoked sooner.  Performed at [facility], [place] [address] [place], [place] [address]   Blood culture (routine x 2)     Status: None (Preliminary result)   Collection Time: 05/12/20 10:06 PM   Specimen: BLOOD  Result Value Ref Range Status   Specimen Description BLOOD LEFT ARM  Final   Special Requests   Final    BOTTLES DRAWN AEROBIC AND ANAEROBIC Blood Culture adequate volume   Culture   Final    NO GROWTH 2 DAYS Performed at [facility], [place] [address], [place], [place] [address]    Report Status PENDING  Incomplete  Culture, blood (Routine X 2) w Reflex to ID Panel     Status: None (Preliminary result)   Collection Time: 05/13/20  5:26 AM   Specimen: BLOOD  Result Value Ref Range Status   Specimen Description BLOOD LEFT ARM  Final   Special Requests   Final    BOTTLES DRAWN AEROBIC ONLY Blood Culture adequate volume   Culture   Final    NO GROWTH 1 DAY Performed at [facility], [place] [address], [place],  [address]    Report Status PENDING  Incomplete  Surgical pcr screen     Status: None   Collection Time: 05/14/20  8:44 AM   Specimen: Nasal Mucosa; Nasal Swab  Result Value Ref Range Status   MRSA, PCR NEGATIVE NEGATIVE Final   Staphylococcus aureus NEGATIVE NEGATIVE  Final    Comment: (NOTE) The Xpert SA Assay (FDA approved for NASAL specimens in patients 86 years of age and older), is one component of a comprehensive surveillance program. It is not intended to diagnose infection nor to guide or monitor treatment. Performed at [facility], [place] [address] [place],  [address]          Radiology Studies: DG Foot Complete Right  Result Date: 05/14/2020 CLINICAL DATA:  Right foot ulcer EXAM: RIGHT FOOT COMPLETE - 3+ VIEW COMPARISON:  Radiographs 02/08/2020 FINDINGS: Question some early ulcerative change along the plantar aspect of the right heel. Finding on a background diffuse edematous changes of the right lower extremity with extensive vascular calcifications. No soft tissue gas or foreign body. No radiographic evidence of osteomyelitis subjacent to this site or elsewhere in the foot. Remote posttraumatic deformity of the distal fifth metatarsal head neck junction No acute bony abnormality. Specifically, no fracture, subluxation, or dislocation. IMPRESSION: 1. Question some early ulcerative change along the plantar aspect of the right heel. No soft tissue gas or foreign body. No radiographic evidence of osteomyelitis. 2. Diffuse edematous changes of the right lower extremity. 3. Remote posttraumatic deformity of the fifth metatarsal. No acute osseous abnormality. Electronically Signed   By: Lovena Le M.D.   On: 05/14/2020 00:29   VAS [place] ABI WITH/WO TBI  Result Date: 05/14/2020 LOWER EXTREMITY DOPPLER STUDY Indications: Peripheral artery disease.  Comparison Study: Known Non-compressible ABI's 1.6 bilaterally in 2021 Performing Technologist: Vonzell Schlatter RVT  Examination Guidelines: A complete evaluation includes at minimum, Doppler waveform signals and systolic blood pressure reading at the level of bilateral brachial, anterior tibial, and posterior tibial arteries, when vessel segments are accessible. Bilateral testing is considered an  integral part of a complete examination. Photoelectric Plethysmograph (PPG) waveforms and toe systolic pressure readings are included as required and additional duplex testing as needed. Limited examinations for reoccurring indications may be performed as noted.  ABI Findings: +---------+------------------+-----+--------+--------+ Right    Rt Pressure (mmHg)IndexWaveformComment  +---------+------------------+-----+--------+--------+ Brachial                                AVF      +---------+------------------+-----+--------+--------+ Great Toe83                0.56 Normal           +---------+------------------+-----+--------+--------+ +---------+------------------+-----+---------+---------------------------------+ Left     Lt Pressure (mmHg)IndexWaveform Comment                           +---------+------------------+-----+---------+---------------------------------+ Brachial 148                    triphasic                                  +---------+------------------+-----+---------+---------------------------------+ Great Toe111               0.75 Normal   mildly dampened compared to right                                          great toe                         +---------+------------------+-----+---------+---------------------------------+ +-------+-----------+-----------+------------+------------+ ABI/TBIToday's ABIToday's TBIPrevious ABIPrevious TBI +-------+-----------+-----------+------------+------------+ Right             .56                    .72          +-------+-----------+-----------+------------+------------+  Left              .75                    .92          +-------+-----------+-----------+------------+------------+ Right TBIs appear decreased. Left TBIs appear decreased.  Summary: Right: The right toe-brachial index is abnormal. Left: The left toe-brachial index is normal.  *See table(s) above for measurements and observations.   Electronically signed by Deitra Mayo MD on 05/13/2020 at 6:24:49 PM.   Final (Updated)         Scheduled Meds: . (feeding supplement) PROSource Plus  30 mL Oral TID BM  . atorvastatin  10 mg Oral QPM  . calcium acetate  1,334 mg Oral BID AC  . Chlorhexidine Gluconate Cloth  6 each Topical Q0600  . [START ON 05/15/2020] Chlorhexidine Gluconate Cloth  6 each Topical Q0600  . cycloSPORINE  1 drop Both Eyes BID  . febuxostat  40 mg Oral QPM  . ferric citrate  420 mg Oral BID AC  . insulin aspart  0-6 Units Subcutaneous Q4H  . midodrine  5 mg Oral Q T,Th,Sa-HD  . multivitamin  1 tablet Oral QHS  . nutrition supplement (JUVEN)  1 packet Oral BID BM  . sodium chloride flush  3 mL Intravenous Q12H  . sucroferric oxyhydroxide  500 mg Oral BID AC   Continuous Infusions: . sodium chloride 250 mL (05/14/20 0517)  . cefTRIAXone (ROCEPHIN)  IV 2 g (05/13/20 1717)   And  . metronidazole 500 mg (05/14/20 0518)  . vancomycin 1,000 mg (05/13/20 1352)     LOS: 2 days    Time spent: 32 minutes     Barb Merino, MD [facility] Pager [phone]

## 2020-05-14 NOTE — Brief Op Note (Signed)
05/14/2020  6:03 PM  PATIENT:  [name]  55 y.o. male  PRE-OPERATIVE DIAGNOSIS:  ulcer, osteomyelitis  POST-OPERATIVE DIAGNOSIS:  * No post-op diagnosis entered *  PROCEDURE:  Procedure(s): DEBRIDEMENT WOUND (Left) BONE BIOPSY (Left)  SURGEON:  Surgeon(s) and Role:    * Trula Slade, DPM - Primary  PHYSICIAN ASSISTANT:   ASSISTANTS: none   ANESTHESIA:   regional and IV sedation  EBL:  10cc    BLOOD ADMINISTERED:none  DRAINS: none   LOCAL MEDICATIONS USED:  NONE  SPECIMEN:  Skin for pathology, bone culture and pathology (calcaneous)  DISPOSITION OF SPECIMEN:  PATHOLOGY  COUNTS:  YES  TOURNIQUET:  * No tourniquets in log *  DICTATION: .Dragon Dictation  PLAN OF CARE: Admit to inpatient   PATIENT DISPOSITION:  PACU - hemodynamically stable.   Delay start of Pharmacological VTE agent (>24hrs) due to surgical blood loss or risk of bleeding: no

## 2020-05-15 ENCOUNTER — Encounter (HOSPITAL_COMMUNITY): Payer: Self-pay | Admitting: Podiatry

## 2020-05-15 LAB — CBC
HCT: 36.8 % — ABNORMAL LOW (ref 39.0–52.0)
Hemoglobin: 10.9 g/dL — ABNORMAL LOW (ref 13.0–17.0)
MCH: 28.8 pg (ref 26.0–34.0)
MCHC: 29.6 g/dL — ABNORMAL LOW (ref 30.0–36.0)
MCV: 97.1 fL (ref 80.0–100.0)
Platelets: 360 10*3/uL (ref 150–400)
RBC: 3.79 MIL/uL — ABNORMAL LOW (ref 4.22–5.81)
RDW: 17.3 % — ABNORMAL HIGH (ref 11.5–15.5)
WBC: 6.2 10*3/uL (ref 4.0–10.5)
nRBC: 0 % (ref 0.0–0.2)

## 2020-05-15 LAB — RENAL FUNCTION PANEL
Albumin: 2.4 g/dL — ABNORMAL LOW (ref 3.5–5.0)
Anion gap: 18 — ABNORMAL HIGH (ref 5–15)
BUN: 31 mg/dL — ABNORMAL HIGH (ref 6–20)
CO2: 22 mmol/L (ref 22–32)
Calcium: 7.9 mg/dL — ABNORMAL LOW (ref 8.9–10.3)
Chloride: 96 mmol/L — ABNORMAL LOW (ref 98–111)
Creatinine, Ser: 7.81 mg/dL — ABNORMAL HIGH (ref 0.61–1.24)
GFR, Estimated: 8 mL/min — ABNORMAL LOW (ref >60)
Glucose, Bld: 141 mg/dL — ABNORMAL HIGH (ref 70–99)
Phosphorus: 5.8 mg/dL — ABNORMAL HIGH (ref 2.5–4.6)
Potassium: 4.3 mmol/L (ref 3.5–5.1)
Sodium: 136 mmol/L (ref 135–145)

## 2020-05-15 LAB — GLUCOSE, CAPILLARY: Glucose-Capillary: 114 mg/dL — ABNORMAL HIGH (ref 70–99)

## 2020-05-15 MED ORDER — LIDOCAINE HCL (PF) 1 % IJ SOLN: 5.0000 mL | INTRAMUSCULAR | Status: DC | PRN

## 2020-05-15 MED ORDER — ALTEPLASE 2 MG IJ SOLR: 2.0000 mg | Freq: Once | INTRAMUSCULAR | Status: DC | PRN

## 2020-05-15 MED ORDER — OXYCODONE HCL 5 MG PO TABS
ORAL_TABLET | ORAL | Status: AC
  Administered 2020-05-15: 10 mg via ORAL
  Filled 2020-05-15: qty 2

## 2020-05-15 MED ORDER — LIDOCAINE-PRILOCAINE 2.5-2.5 % EX CREA: 1.0000 "application " | TOPICAL_CREAM | CUTANEOUS | Status: DC | PRN

## 2020-05-15 MED ORDER — SODIUM CHLORIDE 0.9 % IV SOLN: 100.0000 mL | INTRAVENOUS | Status: DC | PRN

## 2020-05-15 MED ORDER — PENTAFLUOROPROP-TETRAFLUOROETH EX AERO: 1.0000 "application " | INHALATION_SPRAY | CUTANEOUS | Status: DC | PRN

## 2020-05-15 MED ORDER — HEPARIN SODIUM (PORCINE) 1000 UNIT/ML DIALYSIS: 1000.0000 [IU] | INTRAMUSCULAR | Status: DC | PRN

## 2020-05-15 NOTE — Progress Notes (Signed)
PT Cancellation Note  Patient Details Name: ANDAN PAL MRN: [id number] DOB: [birth year]   Cancelled Treatment:    Reason Eval/Treat Not Completed: Patient at procedure or test/unavailable Patient off unit at HD. PT will re-attempt as time allows.   Athel Merriweather A. Gilford Rile PT, DPT Acute Rehabilitation Services Pager [phone] Office [phone]    Linna Hoff 05/15/2020, 3:27 PM

## 2020-05-15 NOTE — Progress Notes (Signed)
Pharmacy Antibiotic Note  [name] [name] is a 55 y.o. male admitted on 05/12/2020 with DFI/osteo s/p I&D and bone biopsy by podiatry on 2/23. Pharmacy has been consulted for Vancomycin and Rocephin dosing.  The patient is ESRD-TTS, last received on 2/22 and planning again 2/24. Current doses remain appropriate.   Plan: - Continue Vancomycin 1g/HD-TTS - Continue Rocephin 2g IV every 24 hours - Will continue to follow HD schedule/duration, culture results, LOT, and antibiotic de-escalation plans   Height: '5\' 5"'$  (165.1 cm) Weight: 106.3 kg (234 lb 5.6 oz) IBW/kg (Calculated) : 61.5  Temp (24hrs), Avg:98 F (36.7 C), Min:97.5 F (36.4 C), Max:98.6 F (37 C)  Recent Labs  Lab 05/12/20 1319 05/12/20 2129 05/13/20 0526 05/14/20 1633  WBC 7.3  --  7.6  --   CREATININE 9.56*  --  10.13* 8.30*  LATICACIDVEN 2.0* 1.9  --   --     Estimated Creatinine Clearance: 11.4 mL/min (A) (by C-G formula based on SCr of 8.3 mg/dL (H)).    No Known Allergies  Vanc 2/21>> Ctx 2/21>> Flagyl 2/21>>  2/21 Fluvid >> neg 2/21 + 2/22 BCx << ngtd 2/23 MRSA PR >> neg 2/23 Bone cx (intra-op) >>  Thank you for allowing pharmacy to be a part of this patient's care.  Alycia Rossetti, PharmD, BCPS Clinical Pharmacist Clinical phone for 05/15/2020: [phone] 05/15/2020 11:20 AM   **Pharmacist phone directory can now be found on [email or web address] ([email or web address]).  Listed under [place].

## 2020-05-15 NOTE — Plan of Care (Signed)

## 2020-05-15 NOTE — Progress Notes (Signed)
Subjective: POD #1 s/p left heel ulcer debridement, excision, bone biopsy with graft application.  States he is doing well not having any significant pain.  He denies any fevers or chills.  Has no concerns today otherwise.  Objective: AAO x3, NAD Left: Dressing clean, dry, intact with any strikethrough. Right: Small eschar plantar aspect the heel without any drainage or pus or any fluctuation crepitation.  No edema, erythema.  Minimal tenderness palpation of this area. No pain with calf compression, swelling, warmth, erythema  Assessment: POD #1 s/p Left foot ulcer debridement; Right heel ulcer  Plan: Regard to the left foot continue current antibiotic regiment and monitor cultures.  If positive would recommend infectious disease consultation.  However given the depth of the wound would consider treating for osteomyelitis as I also believe this is can be difficult wound to heal given the location.  For the right foot recommend a small mount of Betadine to the area daily and offloading.  Monitor for any breakdown of the skin.  Podiatry will continue to follow.  Celesta Gentile, DPM

## 2020-05-15 NOTE — Progress Notes (Signed)
[place] ortho tech was notified of need for left cam walker

## 2020-05-15 NOTE — Progress Notes (Signed)
PROGRESS NOTE    [name] [name]  [id number] DOB: [birth year] DOA: 05/12/2020 PCP: [name], MD    Brief Narrative:  55 year old gentleman with history of ESRD on hemodialysis, type 2 diabetes on insulin pump recently not using, gout, peripheral neuropathy, patient with Raynaud's disease, diabetic neuropathy presents to the hospital with nonhealing wound of the left foot.  Patient has been suffering from nonhealing wound of the left foot, now has also developed wound on right foot.  Patient recently has been followed by Dr. Jacqualyn Posey and seen outpatient.  Increasing pain and ulceration, he was sent to ER for admission and IV antibiotics.   Assessment & Plan:   Active Problems:   HYPERCHOLESTEROLEMIA   Essential hypertension   Diabetes mellitus type 2, uncontrolled ([place])   ESRD on hemodialysis ([place])   Type II diabetes mellitus with renal manifestations ([place])   Secondary hyperparathyroidism ([place])   Secondary anemia   Osteomyelitis due to type 2 diabetes mellitus ([place])   Diabetic foot ulcer ([place])  Diabetic foot ulcer, bilateral diabetic foot infection, suspected left foot osteomyelitis: MRI 2/16, soft tissue infection, suspected osteomyelitis.  Failed oral antibiotic therapy. Vascular ABI 04/27/19 with noncompressible vessels but adequate perfusion.  No evidence of ischemia. Repeat ABI with similar-appearing chronic vascular disease. Remains on vancomycin, Rocephin and Flagyl.  Blood cultures negative so far. Status post surgical debridement/bone biopsy and debridement on 2/23. Surgery recommended no dressing removal until 2/28. Operative report suggested healthy bones. Will wait for surgical cultures/bone cultures and biopsy to determine whether patient will need any long-term IV antibiotics. If bone is affected, will discuss with infectious disease for long-term planning. Start mobilizing with walking boot.  Consult PT OT.  Type 2 diabetes with renal complication: Patient  apparently on insulin pump and has not used any insulin for last 1 month.  He stated that his blood sugars were normal to low. Currently remains on sliding scale. Difficult finger prick secondary to Raynaud's disease and ulcerations of the fingers, he can use his sensor from home.  ESRD on hemodialysis: Getting dialysis.  Anemia of chronic disease: Stable.  Hypertension: Blood pressure fairly stable.  Uses midodrine for intradialytic hypotension.     DVT prophylaxis: SCDs Start: 05/12/20 2208   Code Status: DNR Family Communication: Mom at bedside. Disposition Plan: Status is: Inpatient  Remains inpatient appropriate because:Inpatient level of care appropriate due to severity of illness   Dispo: The patient is from: Home              Anticipated d/c is to: Home with home health.              Anticipated d/c date is: 48 to 72 hours.              Patient currently is not medically stable to d/c.   Difficult to place patient No         Consultants:   Nephrology  Podiatry  Vascular surgery.  Procedures:   Foot debridement.  2/23.  Antimicrobials:  Antibiotics Given (last 72 hours)    Date/Time Action Medication Dose Rate   05/12/20 2121 New Bag/Given  [by gravity]   cefTRIAXone (ROCEPHIN) 2 g in sodium chloride 0.9 % 100 mL IVPB 2 g 200 mL/hr   05/12/20 2201 New Bag/Given   vancomycin (VANCOREADY) IVPB 2000 mg/400 mL 2,000 mg 200 mL/hr   05/12/20 2258 New Bag/Given   metroNIDAZOLE (FLAGYL) IVPB 500 mg 500 mg 100 mL/hr   05/13/20 0540 New Bag/Given  metroNIDAZOLE (FLAGYL) IVPB 500 mg 500 mg 100 mL/hr   05/13/20 1352 New Bag/Given   vancomycin (VANCOCIN) IVPB 1000 mg/200 mL premix 1,000 mg 200 mL/hr   05/13/20 1717 New Bag/Given   cefTRIAXone (ROCEPHIN) 2 g in sodium chloride 0.9 % 100 mL IVPB 2 g 200 mL/hr   05/13/20 2152 New Bag/Given   metroNIDAZOLE (FLAGYL) IVPB 500 mg 500 mg 100 mL/hr   05/14/20 0518 New Bag/Given   metroNIDAZOLE (FLAGYL) IVPB 500 mg  500 mg 100 mL/hr   05/14/20 1407 New Bag/Given   metroNIDAZOLE (FLAGYL) IVPB 500 mg 500 mg 100 mL/hr   05/14/20 2121 New Bag/Given   metroNIDAZOLE (FLAGYL) IVPB 500 mg 500 mg 100 mL/hr   05/15/20 0703 New Bag/Given   metroNIDAZOLE (FLAGYL) IVPB 500 mg 500 mg 100 mL/hr         Subjective: Patient seen and examined.  Mother at the bedside.  Looks very comfortable and was willing to walk.  Pain is controlled.  Remains afebrile.   Objective: Vitals:   05/14/20 1830 05/14/20 1845 05/14/20 2000 05/15/20 0532  BP: 122/68 127/78 131/69 (!) 144/85  Pulse: 85 86 87 91  Resp: '16 18 15 18  '$ Temp: 98 F (36.7 C) 98.1 F (36.7 C) 97.9 F (36.6 C) (!) 97.5 F (36.4 C)  TempSrc:  Oral Oral Oral  SpO2: 96% 95% 100% 100%  Weight:      Height:        Intake/Output Summary (Last 24 hours) at 05/15/2020 1140 Last data filed at 05/14/2020 1807 Gross per 24 hour  Intake 200 ml  Output 5 ml  Net 195 ml   Filed Weights   05/12/20 2000 05/13/20 1143 05/13/20 1445  Weight: 108.9 kg 109.3 kg 106.3 kg    Examination:  General exam: Appears calm and comfortable  Chronically sick looking but not in any distress. Respiratory system: Clear to auscultation. Respiratory effort normal.  No added sounds. Cardiovascular system: S1 & S2 heard, RRR. Gastrointestinal system: Soft and nontender.   Central nervous system: Alert and oriented. No focal neurological deficits. Extremities:  Both upper extremities, fingertips with ulcerations with no infection secondary to Raynaud's phenomenon. Right upper extremity AV fistula with thrill.  Nontender. Inguinal lymph nodes present, right more prominent than left. Scarring with some edema right thigh. Left foot with postsurgical dressing, recommended not to remove. Right foot with small eschar , nontender.      Data Reviewed: I have personally reviewed following labs and imaging studies  CBC: Recent Labs  Lab 05/12/20 1319 05/13/20 0526  05/14/20 1633  WBC 7.3 7.6  --   NEUTROABS 5.6 5.8  --   HGB 10.8* 10.4* 11.9*  HCT 37.6* 34.3* 35.0*  MCV 101.6* 97.4  --   PLT 321 353  --    Basic Metabolic Panel: Recent Labs  Lab 05/12/20 1319 05/13/20 0526 05/14/20 1633  NA 139 136 137  K 5.0 5.2* 4.6  CL 99 98 102  CO2 24 23  --   GLUCOSE 96 112* 131*  BUN 41* 45* 34*  CREATININE 9.56* 10.13* 8.30*  CALCIUM 8.7* 8.5*  --   MG  --  2.3  --   PHOS  --  7.6*  --    GFR: Estimated Creatinine Clearance: 11.4 mL/min (A) (by C-G formula based on SCr of 8.3 mg/dL (H)). Liver Function Tests: Recent Labs  Lab 05/12/20 1319 05/13/20 0526  AST 14* 13*  ALT 13 13  ALKPHOS 261* 215*  BILITOT  0.7 0.8  PROT 6.8 6.6  ALBUMIN 2.4* 2.2*   No results for input(s): LIPASE, AMYLASE in the last 168 hours. No results for input(s): AMMONIA in the last 168 hours. Coagulation Profile: No results for input(s): INR, PROTIME in the last 168 hours. Cardiac Enzymes: No results for input(s): CKTOTAL, CKMB, CKMBINDEX, TROPONINI in the last 168 hours. BNP (last 3 results) No results for input(s): PROBNP in the last 8760 hours. HbA1C: Recent Labs    05/13/20 0526  HGBA1C 7.6*   CBG: Recent Labs  Lab 05/13/20 2156 05/14/20 0053 05/14/20 0509 05/14/20 1819 05/15/20 0909  GLUCAP 161* 168* 193* 106* 114*   Lipid Profile: No results for input(s): CHOL, HDL, LDLCALC, TRIG, CHOLHDL, LDLDIRECT in the last 72 hours. Thyroid Function Tests: Recent Labs    05/13/20 0526  TSH 0.953   Anemia Panel: Recent Labs    05/13/20 0526  VITAMINB12 245  FOLATE 8.0  FERRITIN 62  TIBC 167*  IRON 20*  RETICCTPCT 2.0   Sepsis Labs: Recent Labs  Lab 05/12/20 1319 05/12/20 2129  LATICACIDVEN 2.0* 1.9    Recent Results (from the past 240 hour(s))  SARS CORONAVIRUS 2 (TAT 6-24 HRS) Nasopharyngeal Nasopharyngeal Swab     Status: None   Collection Time: 05/12/20  8:38 PM   Specimen: Nasopharyngeal Swab  Result Value Ref Range Status    SARS Coronavirus 2 NEGATIVE NEGATIVE Final    Comment: (NOTE) SARS-CoV-2 target nucleic acids are NOT DETECTED.  The SARS-CoV-2 RNA is generally detectable in upper and lower respiratory specimens during the acute phase of infection. Negative results do not preclude SARS-CoV-2 infection, do not rule out co-infections with other pathogens, and should not be used as the sole basis for treatment or other patient management decisions. Negative results must be combined with clinical observations, patient history, and epidemiological information. The expected result is Negative.  Fact Sheet for Patients: SugarRoll.be  Fact Sheet for Healthcare Providers: https://www.woods-mathews.com/  This test is not yet approved or cleared by the [place] FDA and  has been authorized for detection and/or diagnosis of SARS-CoV-2 by FDA under an Emergency Use Authorization (EUA). This EUA will remain  in effect (meaning this test can be used) for the duration of the COVID-19 declaration under Se ction 564(b)(1) of the Act, 21 U.S.C. section 360bbb-3(b)(1), unless the authorization is terminated or revoked sooner.  Performed at [facility], [place] [address] [place], [place] [address]   Blood culture (routine x 2)     Status: None (Preliminary result)   Collection Time: 05/12/20 10:06 PM   Specimen: BLOOD  Result Value Ref Range Status   Specimen Description BLOOD LEFT ARM  Final   Special Requests   Final    BOTTLES DRAWN AEROBIC AND ANAEROBIC Blood Culture adequate volume   Culture   Final    NO GROWTH 3 DAYS Performed at [facility] [address], [place], [place] [address]    Report Status PENDING  Incomplete  Culture, blood (Routine X 2) w Reflex to ID Panel     Status: None (Preliminary result)   Collection Time: 05/13/20  5:26 AM   Specimen: BLOOD  Result Value Ref Range Status   Specimen Description BLOOD LEFT ARM  Final    Special Requests   Final    BOTTLES DRAWN AEROBIC ONLY Blood Culture adequate volume   Culture   Final    NO GROWTH 2 DAYS Performed at [facility], [address], [place], [place] [address]    Report Status PENDING  Incomplete  Surgical pcr screen     Status: None   Collection Time: 05/14/20  8:44 AM   Specimen: Nasal Mucosa; Nasal Swab  Result Value Ref Range Status   MRSA, PCR NEGATIVE NEGATIVE Final   Staphylococcus aureus NEGATIVE NEGATIVE Final    Comment: (NOTE) The Xpert SA Assay (FDA approved for NASAL specimens in patients 30 years of age and older), is one component of a comprehensive surveillance program. It is not intended to diagnose infection nor to guide or monitor treatment. Performed at [facility], [place] [address], [place], [place] [address]          Radiology Studies: DG Foot Complete Right  Result Date: 05/14/2020 CLINICAL DATA:  Right foot ulcer EXAM: RIGHT FOOT COMPLETE - 3+ VIEW COMPARISON:  Radiographs 02/08/2020 FINDINGS: Question some early ulcerative change along the plantar aspect of the right heel. Finding on a background diffuse edematous changes of the right lower extremity with extensive vascular calcifications. No soft tissue gas or foreign body. No radiographic evidence of osteomyelitis subjacent to this site or elsewhere in the foot. Remote posttraumatic deformity of the distal fifth metatarsal head neck junction No acute bony abnormality. Specifically, no fracture, subluxation, or dislocation. IMPRESSION: 1. Question some early ulcerative change along the plantar aspect of the right heel. No soft tissue gas or foreign body. No radiographic evidence of osteomyelitis. 2. Diffuse edematous changes of the right lower extremity. 3. Remote posttraumatic deformity of the fifth metatarsal. No acute osseous abnormality. Electronically Signed   By: Lovena Le M.D.   On: 05/14/2020 00:29   VAS [place] ABI WITH/WO TBI  Result Date:  05/14/2020 LOWER EXTREMITY DOPPLER STUDY Indications: Peripheral artery disease.  Comparison Study: Known Non-compressible ABI's 1.6 bilaterally in 2021 Performing Technologist: Vonzell Schlatter RVT  Examination Guidelines: A complete evaluation includes at minimum, Doppler waveform signals and systolic blood pressure reading at the level of bilateral brachial, anterior tibial, and posterior tibial arteries, when vessel segments are accessible. Bilateral testing is considered an integral part of a complete examination. Photoelectric Plethysmograph (PPG) waveforms and toe systolic pressure readings are included as required and additional duplex testing as needed. Limited examinations for reoccurring indications may be performed as noted.  ABI Findings: +---------+------------------+-----+--------+--------+ Right    Rt Pressure (mmHg)IndexWaveformComment  +---------+------------------+-----+--------+--------+ Brachial                                AVF      +---------+------------------+-----+--------+--------+ Great Toe83                0.56 Normal           +---------+------------------+-----+--------+--------+ +---------+------------------+-----+---------+---------------------------------+ Left     Lt Pressure (mmHg)IndexWaveform Comment                           +---------+------------------+-----+---------+---------------------------------+ Brachial 148                    triphasic                                  +---------+------------------+-----+---------+---------------------------------+ Great Toe111               0.75 Normal   mildly dampened compared to right  great toe                         +---------+------------------+-----+---------+---------------------------------+ +-------+-----------+-----------+------------+------------+ ABI/TBIToday's ABIToday's TBIPrevious ABIPrevious TBI  +-------+-----------+-----------+------------+------------+ Right             .56                    .72          +-------+-----------+-----------+------------+------------+ Left              .75                    .92          +-------+-----------+-----------+------------+------------+ Right TBIs appear decreased. Left TBIs appear decreased.  Summary: Right: The right toe-brachial index is abnormal. Left: The left toe-brachial index is normal.  *See table(s) above for measurements and observations.  Electronically signed by Deitra Mayo MD on 05/13/2020 at 6:24:49 PM.   Final (Updated)         Scheduled Meds: . (feeding supplement) PROSource Plus  30 mL Oral TID BM  . atorvastatin  10 mg Oral QPM  . calcium acetate  2,668 mg Oral TID WC  . Chlorhexidine Gluconate Cloth  6 each Topical Q0600  . Chlorhexidine Gluconate Cloth  6 each Topical Q0600  . cinacalcet  30 mg Oral Q supper  . cycloSPORINE  1 drop Both Eyes BID  . febuxostat  40 mg Oral QPM  . ferric citrate  420 mg Oral BID AC  . insulin aspart  0-6 Units Subcutaneous Q4H  . midodrine  10 mg Oral Q T,Th,Sa-HD  . multivitamin  1 tablet Oral QHS  . nutrition supplement (JUVEN)  1 packet Oral BID BM  . sodium chloride flush  3 mL Intravenous Q12H   Continuous Infusions: . sodium chloride 250 mL (05/14/20 0517)  . cefTRIAXone (ROCEPHIN)  IV 2 g (05/13/20 1717)   And  . metronidazole 500 mg (05/15/20 0703)  . vancomycin 1,000 mg (05/13/20 1352)     LOS: 3 days    Time spent: 32 minutes     Barb Merino, MD [facility] Pager [phone]

## 2020-05-15 NOTE — Progress Notes (Signed)
Orthopedic Tech Progress Note Patient Details:  [name] [name] September 10, 1965 [id number]  Ortho Devices Type of Ortho Device: CAM walker Ortho Device/Splint Location: LLE Ortho Device/Splint Interventions: Application,Ordered,Adjustment   Post Interventions Patient Tolerated: Well Instructions Provided: Adjustment of device   [name] [name] 05/15/2020, 6:36 PM

## 2020-05-15 NOTE — Progress Notes (Signed)
[facility] Progress Note   Subjective:   Patient seen and examined at bedside.  Reports surgery went well last night.  Pain well controlled.  Denies CP, SOB, n/v/d, abdominal pain and edema.   Objective Vitals:   05/14/20 1830 05/14/20 1845 05/14/20 2000 05/15/20 0532  BP: 122/68 127/78 131/69 (!) 144/85  Pulse: 85 86 87 91  Resp: '16 18 15 18  '$ Temp: 98 F (36.7 C) 98.1 F (36.7 C) 97.9 F (36.6 C) (!) 97.5 F (36.4 C)  TempSrc:  Oral Oral Oral  SpO2: 96% 95% 100% 100%  Weight:      Height:       Physical Exam General:well developed male in NAD Heart:RRR, no mrg Lungs:mostly CTAB, +crackles in RLL Abdomen:soft, NTND Extremities:+woody edema, L foot wrapped Dialysis Access: RU AVF +b/t   Filed Weights   05/12/20 2000 05/13/20 1143 05/13/20 1445  Weight: 108.9 kg 109.3 kg 106.3 kg    Intake/Output Summary (Last 24 hours) at 05/15/2020 1055 Last data filed at 05/14/2020 1807 Gross per 24 hour  Intake 200 ml  Output 5 ml  Net 195 ml    Additional Objective Labs: Basic Metabolic Panel: Recent Labs  Lab 05/12/20 1319 05/13/20 0526 05/14/20 1633  NA 139 136 137  K 5.0 5.2* 4.6  CL 99 98 102  CO2 24 23  --   GLUCOSE 96 112* 131*  BUN 41* 45* 34*  CREATININE 9.56* 10.13* 8.30*  CALCIUM 8.7* 8.5*  --   PHOS  --  7.6*  --    Liver Function Tests: Recent Labs  Lab 05/12/20 1319 05/13/20 0526  AST 14* 13*  ALT 13 13  ALKPHOS 261* 215*  BILITOT 0.7 0.8  PROT 6.8 6.6  ALBUMIN 2.4* 2.2*   CBC: Recent Labs  Lab 05/12/20 1319 05/13/20 0526 05/14/20 1633  WBC 7.3 7.6  --   NEUTROABS 5.6 5.8  --   HGB 10.8* 10.4* 11.9*  HCT 37.6* 34.3* 35.0*  MCV 101.6* 97.4  --   PLT 321 353  --    CBG: Recent Labs  Lab 05/13/20 2156 05/14/20 0053 05/14/20 0509 05/14/20 1819 05/15/20 0909  GLUCAP 161* 168* 193* 106* 114*   Iron Studies:  Recent Labs    05/13/20 0526  IRON 20*  TIBC 167*  FERRITIN 62   Lab Results  Component Value Date    INR 1.0 04/12/2019   INR 1.00 10/13/2017   INR 0.99 03/13/2016   Studies/Results: DG Foot Complete Right  Result Date: 05/14/2020 CLINICAL DATA:  Right foot ulcer EXAM: RIGHT FOOT COMPLETE - 3+ VIEW COMPARISON:  Radiographs 02/08/2020 FINDINGS: Question some early ulcerative change along the plantar aspect of the right heel. Finding on a background diffuse edematous changes of the right lower extremity with extensive vascular calcifications. No soft tissue gas or foreign body. No radiographic evidence of osteomyelitis subjacent to this site or elsewhere in the foot. Remote posttraumatic deformity of the distal fifth metatarsal head neck junction No acute bony abnormality. Specifically, no fracture, subluxation, or dislocation. IMPRESSION: 1. Question some early ulcerative change along the plantar aspect of the right heel. No soft tissue gas or foreign body. No radiographic evidence of osteomyelitis. 2. Diffuse edematous changes of the right lower extremity. 3. Remote posttraumatic deformity of the fifth metatarsal. No acute osseous abnormality. Electronically Signed   By: Lovena Le M.D.   On: 05/14/2020 00:29   VAS [place] ABI WITH/WO TBI  Result Date: 05/14/2020 LOWER EXTREMITY DOPPLER STUDY  Indications: Peripheral artery disease.  Comparison Study: Known Non-compressible ABI's 1.6 bilaterally in 2021 Performing Technologist: Vonzell Schlatter RVT  Examination Guidelines: A complete evaluation includes at minimum, Doppler waveform signals and systolic blood pressure reading at the level of bilateral brachial, anterior tibial, and posterior tibial arteries, when vessel segments are accessible. Bilateral testing is considered an integral part of a complete examination. Photoelectric Plethysmograph (PPG) waveforms and toe systolic pressure readings are included as required and additional duplex testing as needed. Limited examinations for reoccurring indications may be performed as noted.  ABI Findings:  +---------+------------------+-----+--------+--------+ Right    Rt Pressure (mmHg)IndexWaveformComment  +---------+------------------+-----+--------+--------+ Brachial                                AVF      +---------+------------------+-----+--------+--------+ Great Toe83                0.56 Normal           +---------+------------------+-----+--------+--------+ +---------+------------------+-----+---------+---------------------------------+ Left     Lt Pressure (mmHg)IndexWaveform Comment                           +---------+------------------+-----+---------+---------------------------------+ Brachial 148                    triphasic                                  +---------+------------------+-----+---------+---------------------------------+ Great Toe111               0.75 Normal   mildly dampened compared to right                                          great toe                         +---------+------------------+-----+---------+---------------------------------+ +-------+-----------+-----------+------------+------------+ ABI/TBIToday's ABIToday's TBIPrevious ABIPrevious TBI +-------+-----------+-----------+------------+------------+ Right             .56                    .72          +-------+-----------+-----------+------------+------------+ Left              .75                    .92          +-------+-----------+-----------+------------+------------+ Right TBIs appear decreased. Left TBIs appear decreased.  Summary: Right: The right toe-brachial index is abnormal. Left: The left toe-brachial index is normal.  *See table(s) above for measurements and observations.  Electronically signed by Deitra Mayo MD on 05/13/2020 at 6:24:49 PM.   Final (Updated)     Medications: . sodium chloride 250 mL (05/14/20 0517)  . cefTRIAXone (ROCEPHIN)  IV 2 g (05/13/20 1717)   And  . metronidazole 500 mg (05/15/20 0703)  . vancomycin 1,000 mg  (05/13/20 1352)   . (feeding supplement) PROSource Plus  30 mL Oral TID BM  . atorvastatin  10 mg Oral QPM  . calcium acetate  2,668 mg Oral TID WC  . Chlorhexidine Gluconate Cloth  6 each Topical Q0600  . Chlorhexidine Gluconate Cloth  6 each Topical Q0600  . cinacalcet  30 mg Oral Q supper  . cycloSPORINE  1 drop Both Eyes BID  . febuxostat  40 mg Oral QPM  . ferric citrate  420 mg Oral BID AC  . insulin aspart  0-6 Units Subcutaneous Q4H  . midodrine  10 mg Oral Q T,Th,Sa-HD  . multivitamin  1 tablet Oral QHS  . nutrition supplement (JUVEN)  1 packet Oral BID BM  . sodium chloride flush  3 mL Intravenous Q12H    Dialysis Orders: TTS -[facility] 3.5 hrs, BFR400, DFR600, EDW 107kg,2K/2.5Ca  Access:LU AVF Hep 6000 loading, 500 qhr Hect 4 qHD Aranesp 70 qThurs Venofer '200mg'$  qHD   Assessment/Plan: 1. Diabetic foot wounds- suspected osteomyelitis on MRI (2/16) of plantar aspect of L heel.  Xray R foot with no evidence OM,?early ulcerative changes. Failed outpatient antibiotic therapy. BC with NGTD.  On IV vanc, rocephin and flagyl. B/l TBI decreased.  Seen by [facility] who does not recommend any further arterial work up at this time. Debridement/bone Bx completed yesterday by Dr. Jacqualyn Posey. Pain well controlled.  2. ESRD- On HD TTS. Orders written for HD today per regular schedule. Last K 4.6. Holding heparin due to surgery yesterday.  3. Hypertension/volume- Blood pressure mildly elevated.  On midodrine for BP support with HD. Lower extremity edema noted on exam. Continue to UF as tolerated.  4. Anemiaof CKD- Hgb 11.9. Will hold weekly ESA dose d/t Hgb>11.Hold IV iron in setting of possible osteomyelitis. Follow labs post surgery.  5. Secondary Hyperparathyroidism -Calcium in goal. Phos elevated. Continue VDRA, sensipar '30mg'$  qd, and Auryxia 2AC TID, 1 with snacks and phoslo 4AC TID.  6. Nutrition- Renal diet w/fluid restrictions.  7. DMT2 - on insulin, per  primary 8. Gout- on uloric    [employer], PA-C [place] 05/15/2020,10:55 AM  LOS: 3 days

## 2020-05-15 NOTE — Plan of Care (Signed)

## 2020-05-15 NOTE — Progress Notes (Signed)
Dialysis called and received report

## 2020-05-15 NOTE — Progress Notes (Signed)
Nutrition Follow-up  DOCUMENTATION CODES:   Obesity unspecified  INTERVENTION:   -D/c Prosource Plus -D/c Juven -Double protein portions with meals -Continue renal MVI daily  NUTRITION DIAGNOSIS:   Increased nutrient needs related to wound healing as evidenced by estimated needs.  Ongoing  GOAL:   Patient will meet greater than or equal to 90% of their needs  Progressing   MONITOR:   PO intake,Supplement acceptance,Weight trends,Labs,Skin,I & O's  REASON FOR ASSESSMENT:   Consult Wound healing  ASSESSMENT:   55 y.o. male with medical history significant of ESRD on HD, DM 2, gout peripheral neuropathy, diastolic.systolic CHF   Admitted for left foot osteomyelitis secondary to diabetic foot ulcer  2/23- s/p PROCEDURE:Procedure(s): DEBRIDEMENT WOUND (Left) BONE BIOPSY (Left)  Reviewed I/O's: +195 ml x 24 hours and -2.5 L since admission  Spoke with pt and mother at bedside. Pt reports he has a great appetite, but often has indigestion after eating, which is normal for him. PTA he was consuming 2 meals per day, which consisted of corn bread, beans, and green vegetables. Pt shares he is very particular about what he eats due to this. He refuses all nutritional supplements, due to poor taste and "they make me sick on my stomach", especially Prosource.   Pt denies any weight loss. Her reports his dry weight is around 228-230#.   Discussed importance of good meal intake to promote healing. Pt is pleased about his surgery yesterday and is eager to find out results from bone biopsy.   Medications reviewed and include phoslo and sensipar.   Labs reviewed:CBGS: 106-114 (inpatient orders for glycemic control are 0-6 units insulin aspart every 4 hours).   NUTRITION - FOCUSED PHYSICAL EXAM:  Flowsheet Row Most Recent Value  Orbital Region No depletion  Upper Arm Region No depletion  Thoracic and Lumbar Region No depletion  Buccal Region No depletion  Temple Region No  depletion  Clavicle Bone Region No depletion  Clavicle and Acromion Bone Region No depletion  Scapular Bone Region No depletion  Dorsal Hand No depletion  Patellar Region No depletion  Anterior Thigh Region No depletion  Posterior Calf Region No depletion  Edema (RD Assessment) Moderate  Hair Reviewed  Eyes Reviewed  Mouth Reviewed  Skin Reviewed  Nails Reviewed       Diet Order:   Diet Order            Diet renal with fluid restriction Fluid restriction: 1200 mL Fluid; Room service appropriate? Yes with Assist; Fluid consistency: Thin  Diet effective now                 EDUCATION NEEDS:   No education needs have been identified at this time  Skin:  Skin Assessment: Skin Integrity Issues: Skin Integrity Issues:: Diabetic Ulcer Diabetic Ulcer: rt and lt heel  Last BM:  05/12/20  Height:   Ht Readings from Last 1 Encounters:  05/12/20 '5\' 5"'$  (1.651 m)    Weight:   Wt Readings from Last 1 Encounters:  05/15/20 104 kg    Ideal Body Weight:  61.8 kg  BMI:  Body mass index is 38.15 kg/m.  Estimated Nutritional Needs:   Kcal:  1950-2150  Protein:  120-135 grams  Fluid:  1000 ml + UOP    [name], RD, LDN, [place] Registered Dietitian II Certified Diabetes Care and Education Specialist Please refer to [facility] for RD and/or RD on-call/weekend/after hours pager

## 2020-05-15 NOTE — Progress Notes (Signed)
Patient has refused care or refused orders several times this morning.  He is not within agreement with some of his medications.  He has now refused to allow staff to go glucometer checks and states that he will use his own, if we need readings.  Attempted to explain that we need reading to transfer to the hospital records

## 2020-05-16 DIAGNOSIS — E119 Type 2 diabetes mellitus without complications: Secondary | ICD-10-CM

## 2020-05-16 DIAGNOSIS — N186 End stage renal disease: Secondary | ICD-10-CM

## 2020-05-16 DIAGNOSIS — Z992 Dependence on renal dialysis: Secondary | ICD-10-CM

## 2020-05-16 DIAGNOSIS — M86172 Other acute osteomyelitis, left ankle and foot: Secondary | ICD-10-CM

## 2020-05-16 LAB — SURGICAL PATHOLOGY

## 2020-05-16 MED ORDER — SODIUM CHLORIDE 0.9 % IV SOLN: 2.0000 g | INTRAVENOUS | Status: DC

## 2020-05-16 MED ORDER — SODIUM CHLORIDE 0.9 % IV SOLN: 2.0000 g | Freq: Once | INTRAVENOUS | Status: DC

## 2020-05-16 MED ORDER — VANCOMYCIN HCL IN DEXTROSE 1-5 GM/200ML-% IV SOLN: 1000.0000 mg | INTRAVENOUS | Status: AC

## 2020-05-16 MED ORDER — METRONIDAZOLE 500 MG PO TABS
500.0000 mg | ORAL_TABLET | Freq: Three times a day (TID) | ORAL | Status: DC
  Administered 2020-05-16: 500 mg via ORAL
  Filled 2020-05-16: qty 1

## 2020-05-16 MED ORDER — METRONIDAZOLE 500 MG PO TABS: 500.0000 mg | ORAL_TABLET | Freq: Three times a day (TID) | ORAL | 0 refills | Status: DC

## 2020-05-16 MED ORDER — CHLORHEXIDINE GLUCONATE CLOTH 2 % EX PADS: 6.0000 | MEDICATED_PAD | Freq: Every day | CUTANEOUS | Status: DC

## 2020-05-16 NOTE — Anesthesia Postprocedure Evaluation (Signed)
Anesthesia Post Note  Patient: [name] [name]  Procedure(s) Performed: DEBRIDEMENT WOUND (Left Foot) BONE BIOPSY (Left Foot) GRAFT APPLICATION (Left Foot)     Patient location during evaluation: PACU Anesthesia Type: Regional Level of consciousness: awake and alert Pain management: pain level controlled Vital Signs Assessment: post-procedure vital signs reviewed and stable Respiratory status: spontaneous breathing, nonlabored ventilation, respiratory function stable and patient connected to nasal cannula oxygen Cardiovascular status: stable and blood pressure returned to baseline Postop Assessment: no apparent nausea or vomiting Anesthetic complications: no   No complications documented.  Last Vitals:  Vitals:   05/16/20 0721 05/16/20 1445  BP: (!) 126/91 131/66  Pulse: 89 89  Resp: 16 16  Temp: 36.7 C 36.8 C  SpO2: 100% 99%    Last Pain:  Vitals:   05/16/20 1445  TempSrc: Oral  PainSc:                  [name] [name]

## 2020-05-16 NOTE — Anesthesia Postprocedure Evaluation (Signed)
Anesthesia Post Note  Patient: [name] [name]  Procedure(s) Performed: DEBRIDEMENT WOUND (Left Foot) BONE BIOPSY (Left Foot) GRAFT APPLICATION (Left Foot)     Patient location during evaluation: PACU Anesthesia Type: Regional Level of consciousness: awake and alert Pain management: pain level controlled Vital Signs Assessment: post-procedure vital signs reviewed and stable Respiratory status: spontaneous breathing, nonlabored ventilation, respiratory function stable and patient connected to nasal cannula oxygen Cardiovascular status: stable and blood pressure returned to baseline Postop Assessment: no apparent nausea or vomiting Anesthetic complications: no   No complications documented.  Last Vitals:  Vitals:   05/16/20 0721 05/16/20 1445  BP: (!) 126/91 131/66  Pulse: 89 89  Resp: 16 16  Temp: 36.7 C 36.8 C  SpO2: 100% 99%    Last Pain:  Vitals:   05/16/20 1445  TempSrc: Oral  PainSc:                  [name] COKER

## 2020-05-16 NOTE — Consult Note (Signed)
[facility]                                                                                        Patient Identification: Patient Name: [name] [name] MRN: [id number] [place] Date: 05/12/2020  1:14 PM Today's Date: 05/16/2020 Reason for consult:  Requesting provider:   Active Problems:   HYPERCHOLESTEROLEMIA   Essential hypertension   Diabetes mellitus type 2, uncontrolled ([place])   ESRD on hemodialysis ([place])   Type II diabetes mellitus with renal manifestations ([place])   Secondary hyperparathyroidism ([place])   Secondary anemia   Osteomyelitis due to type 2 diabetes mellitus ([place])   Diabetic foot ulcer ([place])   Antibiotics: ceftriaxone 2/21 -2/22, 2/24-                    Metronidazole 2/21-                    Vancomycin 2/21-  Lines/Tubes: PIVs Rt forearm fstula, PIVs   Assessment Left heel DFU/osteomyelitis  S/p debridement and bone biopsy on 2/23, Path sent ( pending),. OR cx with no organisms in gram stain and no growth in cx <24 hrs.  ABI WNL.  Vascular SX consulted with no concerns of poor blood flow to the Lower extremities   Rt heel DFU  - does not appear to be infected currently , continue wound care  DM ESRSD on HD   Recommendations  Continue vancomycin, cefepime, dosed with HD Continue Metronidazole  Duration 6 weeks from 2/23. End date 06/25/20 Monitor CBC and BMP weekly ESR and CRP weekly Fu with Podiatry Fu with [facility] will be made Will follow cultures peripherally and adjust antibiotics as needed. Otherwise wil sign off for now.    Rest of the management as per the primary team. Please call with questions or concerns.  Thank you for the consult ________________________________________________________________________________________________________ HPI and Hospital Course: [name] [name] is a 55 y.o. male with PMH significant for failed renal transplant who is ESRD on HD  via RT arm AVF, DM 2, gout peripheral neuropathy,  CHF who presented to the ED on 2/21 with worsening pain in his heel ulcers.  He was seen by podiatry on day of ED visit,  undergone nail debridement and was advised to to to the ED given DFU with increasing pain and concerns for osteomyelitis. Denies any fevers, chill, sweats. Denies any n/v/d/abdominal pain and denies any GU symptoms. Recent MRI Left heel on 2/16 with > early OM.   He underwent debridement and bone biopsy on 2/23 by Podiatry, Path sent ( pending),. OR cx with no organisms in gram stain and no growth in cx <24 hrs. Podiatry recommending treating as osteo given the depth of the wound. Vascular SX consulted with no concerns of poor blood flow to the Lower extremities. ABI WNL.   ROS: General- Denies fever, chills, loss of appetite and loss of weight HEENT - Denies headache, blurry vision, neck pain, sinus pain Chest - Denies any chest pain, SOB or cough CVS- Denies any dizziness/lightheadedness, syncopal attacks, palpitations Abdomen- Denies any nausea, vomiting, abdominal pain, hematochezia and  diarrhea Neuro - Denies any weakness, numbness, tingling sensation Psych - Denies any changes in mood irritability or depressive symptoms GU- Denies any burning, dysuria, hematuria or increased frequency of urination Skin - denies any rashes/lesions MSK - denies any joint pain/swelling or restricted ROM   Past Medical History:  Diagnosis Date  . Anal infection    posterior anal canal  . Anemia, chronic renal failure   . Atrophic kidney    BILATERAL  . DM type 2 causing ESRD [facility])    Nephrologist-- dr [name] [facility])--  on hemodialysis since June 2012 at  [name]  TTS  . Hemodialysis patient [facility])    at [facility] on Tues/ Thur/Sat/schedule  . Hemorrhoids   . Hepatitis B antibody positive   . History of pleural effusion    bilateral  . Hyperparathyroidism, secondary renal ([place])   . Hypertension   .  Ischemic cardiomyopathy    per echo 07-01-2014  ef 45%  . LAFB (left anterior fascicular block)   . Peripheral neuropathy   . Systolic and diastolic CHF, chronic ([place])    CARDIOLOGIST-  DR Daneen Schick ([place])  AND DR [name] ([name])   Past Surgical History:  Procedure Laterality Date  . APPENDECTOMY  09-12-2004   laparotomy w/ drainage peritinitis  . AV FISTULA PLACEMENT  02-27-2010   right forearm (RADIOCEPHALIC)  . AV FISTULA REPAIR  10-30-2010  . BONE BIOPSY Left 05/14/2020   Procedure: BONE BIOPSY;  Surgeon: Trula Slade, DPM;  Location: [facility];  Service: Podiatry;  Laterality: Left;  . CARDIOVASCULAR STRESS TEST  10-29-2011   dr Daneen Schick   Low risk scan;  mild perfusion defect seen in the basal inferoseptal, basal inferior and mid inferior regions consistent with an infarct/scar and/or overlying attenuation/  mild to moderate global LVSF,  ef 40-45%  . DOBUTAMINE STRESS ECHO  07-23-2012   [name]   abnormal ;  at rest estimated lvef 25-30% and global severe LV hypokinesis ;  no cp during stress and achieved 85% maxium predicted heart rate;  negative stress ECG for inducible ischemia;  estimated lvef with stress 35-40%;  augmentation of wall segments consistant with cardiomyopathy and differential fibrosis  . FISTULOTOMY N/A 03/26/2015   Procedure: FISTULOTOMY;  Surgeon: [name], MD;  Location: [facility];  Service: General;  Laterality: N/A;  . GRAFT APPLICATION Left 10/14/2033   Procedure: GRAFT APPLICATION;  Surgeon: Trula Slade, DPM;  Location: [facility];  Service: Podiatry;  Laterality: Left;  . INCISION AND DRAINAGE ABSCESS N/A 03/26/2015   Procedure: ANAL INCISION AND DRAINAGE;  Surgeon: [name], MD;  Location: [facility];  Service: General;  Laterality: N/A;  . RETINAL DETACHMENT SURGERY Left 2011   incomplete repair/ needs eye drops to keep pressure down  . TEE WITHOUT CARDIOVERSION N/A  10/17/2017   Procedure: TRANSESOPHAGEAL ECHOCARDIOGRAM (TEE);  Surgeon: Josue Hector, MD;  Location: [facility] ENDOSCOPY;  Service: Cardiovascular;  Laterality: N/A;  . TRANSTHORACIC ECHOCARDIOGRAM  07-01-2014    done at [facility]   grade 1 diastolic dysfunction,  ef 45%/  trace TR and PR  . WOUND DEBRIDEMENT Left 05/14/2020   Procedure: DEBRIDEMENT WOUND;  Surgeon: Trula Slade, DPM;  Location: [facility];  Service: Podiatry;  Laterality: Left;     Scheduled Meds: . atorvastatin  10 mg Oral QPM  . calcium acetate  2,668 mg Oral TID WC  . Chlorhexidine Gluconate Cloth  6 each Topical Q0600  . Chlorhexidine Gluconate Cloth  6 each Topical Q0600  . cinacalcet  30 mg Oral Q supper  . cycloSPORINE  1 drop Both Eyes BID  . febuxostat  40 mg Oral QPM  . ferric citrate  420 mg Oral BID AC  . insulin aspart  0-6 Units Subcutaneous Q4H  . midodrine  10 mg Oral Q T,Th,Sa-HD  . multivitamin  1 tablet Oral QHS  . sodium chloride flush  3 mL Intravenous Q12H   Continuous Infusions: . sodium chloride 250 mL (05/14/20 0517)  . cefTRIAXone (ROCEPHIN)  IV 2 g (05/15/20 1917)   And  . metronidazole 500 mg (05/16/20 0531)  . vancomycin 1,000 mg (05/15/20 1702)   PRN Meds:.sodium chloride, acetaminophen **OR** acetaminophen, ferric citrate, gabapentin, HYDROcodone-acetaminophen, ondansetron, oxyCODONE, sodium chloride flush  No Known Allergies  Social History   Socioeconomic History  . Marital status: Single    Spouse name: Not on file  . Number of children: Not on file  . Years of education: Not on file  . Highest education level: Not on file  Occupational History  . Occupation: Retired  Tobacco Use  . Smoking status: Never Smoker  . Smokeless tobacco: Never Used  Vaping Use  . Vaping Use: Never used  Substance and Sexual Activity  . Alcohol use: No  . Drug use: No  . Sexual activity: Not Currently  Other Topics Concern  . Not on file  Social History Narrative  . Not on  file   Social Determinants of Health   Financial Resource Strain: Not on file  Food Insecurity: Not on file  Transportation Needs: Not on file  Physical Activity: Not on file  Stress: Not on file  Social Connections: Not on file  Intimate Partner Violence: Not on file    Vitals BP (!) 126/91 (BP Location: Left Arm)   Pulse 89   Temp 98.1 F (36.7 C) (Oral)   Resp 16   Ht _0  (1.651 m)   Wt 104 kg   SpO2 100%   BMI 38.15 kg/m   Physical Exam Obese black male sitting up in chair PERRLA, pale conjunctiva, no icetrus Chest - clear air entry bilaterally  CVS- Normal s1s2, RRR  Abdomen - soft Extremities - left leg is bandaged with surgical dressing, Right heel has a small ulcer without any drainage or pus or any fluctuation/crepitus, has minimal tenderness Skin - Rt UE AVF no erythema and tenderness Neuro - grossly non focal    Pertinent Microbiology Results for orders placed or performed during the hospital encounter of 05/12/20  SARS CORONAVIRUS 2 (TAT 6-24 HRS) Nasopharyngeal Nasopharyngeal Swab     Status: None   Collection Time: 05/12/20  8:38 PM   Specimen: Nasopharyngeal Swab  Result Value Ref Range Status   SARS Coronavirus 2 NEGATIVE NEGATIVE Final    Comment: (NOTE) SARS-CoV-2 target nucleic acids are NOT DETECTED.  The SARS-CoV-2 RNA is generally detectable in upper and lower respiratory specimens during the acute phase of infection. Negative results do not preclude SARS-CoV-2 infection, do not rule out co-infections with other pathogens, and should not be used as the sole basis for treatment or other patient management decisions. Negative results must be combined with clinical observations, patient history, and epidemiological information. The expected result is Negative.  Fact Sheet for Patients: SugarRoll.be  Fact Sheet for Healthcare Providers: https://www.woods-mathews.com/  This test is not yet approved  or cleared by the [place] FDA and  has  been authorized for detection and/or diagnosis of SARS-CoV-2 by FDA under an Emergency Use Authorization (EUA). This EUA will remain  in effect (meaning this test can be used) for the duration of the COVID-19 declaration under Se ction 564(b)(1) of the Act, 21 U.S.C. section 360bbb-3(b)(1), unless the authorization is terminated or revoked sooner.  Performed at [facility], [place] [address] [place], [place] [address]   Blood culture (routine x 2)     Status: None (Preliminary result)   Collection Time: 05/12/20 10:06 PM   Specimen: BLOOD  Result Value Ref Range Status   Specimen Description BLOOD LEFT ARM  Final   Special Requests   Final    BOTTLES DRAWN AEROBIC AND ANAEROBIC Blood Culture adequate volume   Culture   Final    NO GROWTH 3 DAYS Performed at [facility] [address] [place], [place] [address]    Report Status PENDING  Incomplete  Culture, blood (Routine X 2) w Reflex to ID Panel     Status: None (Preliminary result)   Collection Time: 05/13/20  5:26 AM   Specimen: BLOOD  Result Value Ref Range Status   Specimen Description BLOOD LEFT ARM  Final   Special Requests   Final    BOTTLES DRAWN AEROBIC ONLY Blood Culture adequate volume   Culture   Final    NO GROWTH 2 DAYS Performed at [facility], [address], [place], [place] [address]    Report Status PENDING  Incomplete  Surgical pcr screen     Status: None   Collection Time: 05/14/20  8:44 AM   Specimen: Nasal Mucosa; Nasal Swab  Result Value Ref Range Status   MRSA, PCR NEGATIVE NEGATIVE Final   Staphylococcus aureus NEGATIVE NEGATIVE Final    Comment: (NOTE) The Xpert SA Assay (FDA approved for NASAL specimens in patients 22 years of age and older), is one component of a comprehensive surveillance program. It is not intended to diagnose infection nor to guide or monitor treatment. Performed at [facility], [place] [address],  [place], [place] [address]   Aerobic/Anaerobic Culture w Gram Stain (surgical/deep wound)     Status: None (Preliminary result)   Collection Time: 05/14/20  5:44 PM   Specimen: Bone; Tissue  Result Value Ref Range Status   Specimen Description BONE LEFT HEEL ULCER  Final   Special Requests NONE  Final   Gram Stain NO WBC SEEN NO ORGANISMS SEEN   Final   Culture   Final    NO GROWTH < 24 HOURS Performed at [facility], [place] [address] [place],  [address]    Report Status PENDING  Incomplete    Pertinent Lab seen by me: CBC Latest Ref Rng & Units 05/15/2020 05/14/2020 05/13/2020  WBC 4.0 - 10.5 K/uL 6.2 - 7.6  Hemoglobin 13.0 - 17.0 g/dL 10.9(L) 11.9(L) 10.4(L)  Hematocrit 39.0 - 52.0 % 36.8(L) 35.0(L) 34.3(L)  Platelets 150 - 400 K/uL 360 - 353   CMP Latest Ref Rng & Units 05/15/2020 05/14/2020 05/13/2020  Glucose 70 - 99 mg/dL 141(H) 131(H) 112(H)  BUN 6 - 20 mg/dL 31(H) 34(H) 45(H)  Creatinine 0.61 - 1.24 mg/dL 7.81(H) 8.30(H) 10.13(H)  Sodium 135 - 145 mmol/L 136 137 136  Potassium 3.5 - 5.1 mmol/L 4.3 4.6 5.2(H)  Chloride 98 - 111 mmol/L 96(L) 102 98  CO2 22 - 32 mmol/L 22 - 23  Calcium 8.9 - 10.3 mg/dL 7.9(L) - 8.5(L)  Total Protein 6.5 - 8.1 g/dL - - 6.6  Total Bilirubin 0.3 - 1.2 mg/dL - - 0.8  Alkaline Phos 38 - 126 U/L - - 215(H)  AST 15 - 41 U/L - - 13(L)  ALT 0 - 44 U/L - - 13     Pertinent Imagings/Other Imagings Plain films and CT images have been personally visualized and interpreted; radiology reports have been reviewed. Decision making incorporated into the Impression / Recommendations.  ABI 05/13/20 Right TBIs appear decreased. Left TBIs appear decreased.    Summary:  Right: The right toe-brachial index is abnormal.   Left: The left toe-brachial index is normal.    MRI left heel 05/07/20 IMPRESSION: 1. Presumed wound along the plantar aspect of the hindfoot, superficial to the insertion of the medial cord of the plantar fascia. Inflammatory  changes in the underlying subcutaneous fat with possible tiny foreign body or soft tissue emphysema. No drainable fluid collection. 2. Mild marrow T2 hyperintensity and enhancement within the plantar aspect of the calcaneal tuberosity near the attachment of the medial cord of the plantar fascia, likely reactive. Given proximity to the presumed wound, early osteomyelitis difficult to exclude, although no cortical destruction identified. 3. Tendinosis and possible partial tearing of the medial cord of the plantar fascia. 4. No other significant findings.  Rt Foot Xray 05/13/20 FINDINGS: Question some early ulcerative change along the plantar aspect of the right heel. Finding on a background diffuse edematous changes of the right lower extremity with extensive vascular calcifications. No soft tissue gas or foreign body. No radiographic evidence of osteomyelitis subjacent to this site or elsewhere in the foot. Remote posttraumatic deformity of the distal fifth metatarsal head neck junction No acute bony abnormality. Specifically, no fracture, subluxation, or dislocation.  IMPRESSION: 1. Question some early ulcerative change along the plantar aspect of the right heel. No soft tissue gas or foreign body. No radiographic evidence of osteomyelitis. 2. Diffuse edematous changes of the right lower extremity. 3. Remote posttraumatic deformity of the fifth metatarsal. No acute osseous abnormality.  I have spent 60 minutes for this patient encounter including review of prior medical records with greater than 50% of time being face to face and coordination of their care.  Electronically signed by:   Rosiland Oz, MD Infectious Disease Physician [facility] for Infectious Disease Pager: [phone]

## 2020-05-16 NOTE — Progress Notes (Addendum)
Discharge instructions reviewed with pt.  Copy of instructions given to pt, pt made aware of script at his pharmacy.  Pt plans to take all evening meds when he gets home.  Pt d/c'd via wheelchair with belongings, with his mother.         Escorted by unit NT.  3-IN-1 being by CM Laurena Slimmer and will be sent to pt's home. (none in stock on unit or with CM).  Pt informed and aware it will be delivered to his home and should be contacted on delivery time.

## 2020-05-16 NOTE — Discharge Summary (Signed)
Physician Discharge Summary  [name] [name]:[id number] DOB: 09-09-1965 DOA: 05/12/2020  PCP: [name], MD  Admit date: 05/12/2020 Discharge date: 05/16/2020  Admitted From: Home  Disposition: Home with home health.  Recommendations for Outpatient Follow-up:  1. Follow up with PCP in 1-2 weeks 2. Please obtain ESR/CRP/CMP and CBC in 1 week.  ID clinic to schedule follow-up.  3. Please follow up at Dr. Leigh Aurora office Monday. 4. He will receive antibiotics with dialysis.  Home Health: Physical therapy Equipment/Devices: Walking boot left heel  Discharge Condition: Stable CODE STATUS: DNR Diet recommendation: Low-salt and low-carb diet.  Discharge summary:  55 year old gentleman with history of ESRD on hemodialysis, type 2 diabetes on insulin pump, gout, peripheral neuropathy, Raynaud's disease with bilateral hand and fingertip ulcers, diabetic neuropathy presented to the hospital from podiatry office where he was following up for bilateral heel ulcers.  Patient had developed nonhealing wound on both soles of the foot, left was more painful and enlarging than right.  Patient admitted to the hospital.  Treated with antibiotics.  Underwent surgical incision to deep tissues on the left foot with biopsies and cultures pending.  Clinically improving.  Surgery recommended treatment as osteomyelitis because of severity of soft tissue infection and involvement of deep tissues. As per ID recommendations, Patient going home on vancomycin and cefepime that he will be taking at hemodialysis center. Metronidazole.  Total 3 antibiotic therapy for 6 weeks until 06/25/20.  Other chronic medical issues remained stable. He is using cam boot for the left foot. He will have dressing change at Dr. Leigh Aurora office on 2/28.    Discharge Diagnoses:  Active Problems:   HYPERCHOLESTEROLEMIA   Essential hypertension   Diabetes mellitus type 2, uncontrolled ([place])   ESRD on hemodialysis  ([name])   Type II diabetes mellitus with renal manifestations ([place])   Secondary hyperparathyroidism ([place])   Secondary anemia   Osteomyelitis due to type 2 diabetes mellitus ([place])   Diabetic foot ulcer ([place])    Discharge Instructions  Discharge Instructions    Call MD for:  redness, tenderness, or signs of infection (pain, swelling, redness, odor or green/yellow discharge around incision site)   Complete by: As directed    Call MD for:  severe uncontrolled pain   Complete by: As directed    Call MD for:  temperature >100.4   Complete by: As directed    Diet - low sodium heart healthy   Complete by: As directed    Diet Carb Modified   Complete by: As directed    Discharge wound care:   Complete by: As directed    Change dressing on right foot daily. Apply a small amount of betadine followed by 4x4, kerlix. Float heels in bed.  Do not change left foot surgical bandage.   Increase activity slowly   Complete by: As directed      Allergies as of 05/16/2020   No Known Allergies     Medication List    TAKE these medications   aspirin 325 MG tablet Take 325 mg by mouth every evening.   atorvastatin 10 MG tablet Commonly known as: LIPITOR Take 10 mg by mouth every evening.   calcium acetate 667 MG capsule Commonly known as: PHOSLO Take 1,334 mg by mouth See admin instructions. Take 2 capsules (1334 mg) by mouth twice daily - with lunch and supper   ceFEPIme 2 g in sodium chloride 0.9 % 100 mL Inject 2 g into the vein every Tuesday, Thursday, and Saturday  at 6 PM for 17 doses. Start taking on: May 17, 2020   celecoxib 100 MG capsule Commonly known as: CELEBREX Take 100 mg by mouth daily as needed (pain).   Cequa 0.09 % Soln Generic drug: cycloSPORINE (PF) Place 1 drop into both eyes 2 (two) times daily.   febuxostat 40 MG tablet Commonly known as: ULORIC Take 40 mg by mouth every evening.   ferric citrate 1 GM 210 MG(Fe) tablet Commonly known as: AURYXIA Take  420 mg by mouth See admin instructions. Take 2 tablets (420 mg) by mouth twice daily - with lunch and supper   FreeStyle Libre 14 Day Sensor Misc See admin instructions.   gabapentin 300 MG capsule Commonly known as: NEURONTIN Take 300 mg by mouth daily as needed (pain).   ibuprofen 200 MG tablet Commonly known as: ADVIL Take 600 mg by mouth 3 (three) times daily as needed (pain).   lidocaine 5 % ointment Commonly known as: XYLOCAINE Apply 1 application topically 2 (two) times daily as needed (finger pain).   metroNIDAZOLE 500 MG tablet Commonly known as: FLAGYL Take 1 tablet (500 mg total) by mouth every 8 (eight) hours.   midodrine 5 MG tablet Commonly known as: PROAMATINE Take 5 mg by mouth See admin instructions. Take one tablet (5 mg) by mouth Tuesday, Thursday, Saturday before dialysis   multivitamin with minerals Tabs tablet Take 1 tablet by mouth every evening.   mupirocin ointment 2 % Commonly known as: BACTROBAN APPLY TO AFFECTED AREAS OF BOTH FEET ONCE DAILY. What changed: See the new instructions.   OmniPod Dash 5 Pack Pods Misc Inject into the skin. Use with Humalog   ondansetron 4 MG tablet Commonly known as: ZOFRAN Take 4 mg by mouth 3 (three) times daily as needed for nausea or vomiting.   Oxycodone HCl 10 MG Tabs Take 10 mg by mouth 3 (three) times daily as needed (severe pain).   triamcinolone 0.1 % Commonly known as: KENALOG Apply 1 application topically daily as needed (itching).   vancomycin 1-5 GM/200ML-% Soln Commonly known as: VANCOCIN Inject 200 mLs (1,000 mg total) into the vein Every Tuesday,Thursday,and Saturday with dialysis. Start taking on: May 17, 2020   Velphoro 500 MG chewable tablet Generic drug: sucroferric oxyhydroxide Chew 500 mg by mouth See admin instructions. Chew one tablet (500 mg) by mouth twice daily - with lunch and supper            Discharge Care Instructions  (From admission, onward)         Start      Ordered   05/16/20 0000  Discharge wound care:       Comments: Change dressing on right foot daily. Apply a small amount of betadine followed by 4x4, kerlix. Float heels in bed.  Do not change left foot surgical bandage.   05/16/20 1420          No Known Allergies  Consultations:  Podiatry  Nephrology  Infectious disease   Procedures/Studies: MR HEEL LEFT W WO CONTRAST  Result Date: 05/07/2020 CLINICAL DATA:  Penetrating foot trauma. Foreign body suspected. Diabetic heel ulcer with pain and swelling for 1 month EXAM: MRI OF LOWER LEFT EXTREMITY WITHOUT AND WITH CONTRAST TECHNIQUE: Multiplanar, multisequence MR imaging of the left hindfoot was performed both before and after administration of intravenous contrast. CONTRAST:  4mL GADAVIST GADOBUTROL 1 MMOL/ML IV SOLN COMPARISON:  Radiographs 04/24/2018. No recent radiographs available FINDINGS: TENDONS Peroneal: Intact and normally positioned. Posteromedial: Intact and normally positioned.  Anterior: Intact and normally positioned. Achilles: Intact. Plantar Fascia: There is tendinosis and possible partial tearing at the calcaneal attachment the medial cord. LIGAMENTS Lateral: The anterior and posterior talofibular and calcaneofibular ligaments are intact. Medial: The deltoid and visualized portions of the spring ligament appear intact. CARTILAGE AND BONES Ankle Joint: The talar dome and tibial plafond are intact. No significant ankle joint effusion or suspicious synovial enhancement. Subtalar Joints/Sinus Tarsi: Unremarkable. Bones: There is low-level marrow T2 hyperintensity and enhancement within the plantar aspect of the calcaneal tuberosity near the attachment of the medial cord of the plantar fascia. No cortical destruction or other significant osseous findings are seen. Other: Skin irregularity along the plantar aspect of the hindfoot, superficial to the insertion of the medial cord of the plantar fascia, presumed to reflect the  patient's wound. Within the underlying subcutaneous fat, there is T2 hyperintensity and enhancement consistent with inflammation. There is no focal fluid collection. There is a tiny linear focus of low signal on all pulse sequences (best seen on the axial images) in this area which could reflect gas or a small foreign body. No drainable fluid collection. IMPRESSION: 1. Presumed wound along the plantar aspect of the hindfoot, superficial to the insertion of the medial cord of the plantar fascia. Inflammatory changes in the underlying subcutaneous fat with possible tiny foreign body or soft tissue emphysema. No drainable fluid collection. 2. Mild marrow T2 hyperintensity and enhancement within the plantar aspect of the calcaneal tuberosity near the attachment of the medial cord of the plantar fascia, likely reactive. Given proximity to the presumed wound, early osteomyelitis difficult to exclude, although no cortical destruction identified. 3. Tendinosis and possible partial tearing of the medial cord of the plantar fascia. 4. No other significant findings. Electronically Signed   By: Richardean Sale M.D.   On: 05/07/2020 16:15   DG Foot Complete Right  Result Date: 05/14/2020 CLINICAL DATA:  Right foot ulcer EXAM: RIGHT FOOT COMPLETE - 3+ VIEW COMPARISON:  Radiographs 02/08/2020 FINDINGS: Question some early ulcerative change along the plantar aspect of the right heel. Finding on a background diffuse edematous changes of the right lower extremity with extensive vascular calcifications. No soft tissue gas or foreign body. No radiographic evidence of osteomyelitis subjacent to this site or elsewhere in the foot. Remote posttraumatic deformity of the distal fifth metatarsal head neck junction No acute bony abnormality. Specifically, no fracture, subluxation, or dislocation. IMPRESSION: 1. Question some early ulcerative change along the plantar aspect of the right heel. No soft tissue gas or foreign body. No  radiographic evidence of osteomyelitis. 2. Diffuse edematous changes of the right lower extremity. 3. Remote posttraumatic deformity of the fifth metatarsal. No acute osseous abnormality. Electronically Signed   By: Lovena Le M.D.   On: 05/14/2020 00:29   VAS [place] ABI WITH/WO TBI  Result Date: 05/14/2020 LOWER EXTREMITY DOPPLER STUDY Indications: Peripheral artery disease.  Comparison Study: Known Non-compressible ABI's 1.6 bilaterally in 2021 Performing Technologist: Vonzell Schlatter RVT  Examination Guidelines: A complete evaluation includes at minimum, Doppler waveform signals and systolic blood pressure reading at the level of bilateral brachial, anterior tibial, and posterior tibial arteries, when vessel segments are accessible. Bilateral testing is considered an integral part of a complete examination. Photoelectric Plethysmograph (PPG) waveforms and toe systolic pressure readings are included as required and additional duplex testing as needed. Limited examinations for reoccurring indications may be performed as noted.  ABI Findings: +---------+------------------+-----+--------+--------+ Right    Rt Pressure (mmHg)IndexWaveformComment  +---------+------------------+-----+--------+--------+ Brachial  AVF      +---------+------------------+-----+--------+--------+ Great Toe83                0.56 Normal           +---------+------------------+-----+--------+--------+ +---------+------------------+-----+---------+---------------------------------+ Left     Lt Pressure (mmHg)IndexWaveform Comment                           +---------+------------------+-----+---------+---------------------------------+ Brachial 148                    triphasic                                  +---------+------------------+-----+---------+---------------------------------+ Great Toe111               0.75 Normal   mildly dampened compared to right                                           great toe                         +---------+------------------+-----+---------+---------------------------------+ +-------+-----------+-----------+------------+------------+ ABI/TBIToday's ABIToday's TBIPrevious ABIPrevious TBI +-------+-----------+-----------+------------+------------+ Right             .56                    .72          +-------+-----------+-----------+------------+------------+ Left              .75                    .92          +-------+-----------+-----------+------------+------------+ Right TBIs appear decreased. Left TBIs appear decreased.  Summary: Right: The right toe-brachial index is abnormal. Left: The left toe-brachial index is normal.  *See table(s) above for measurements and observations.  Electronically signed by Deitra Mayo MD on 05/13/2020 at 6:24:49 PM.   Final (Updated)     (Echo, Carotid, EGD, Colonoscopy, ERCP)    Subjective: Patient seen and examined.  Mother at the bedside.  Patient is eager to go home.  Case discussed with infectious disease and nephrology.  Outpatient management discussed.  Referral made.   Discharge Exam: Vitals:   05/16/20 0657 05/16/20 0721  BP: 122/75 (!) 126/91  Pulse: 88 89  Resp: 18 16  Temp: 97.7 F (36.5 C) 98.1 F (36.7 C)  SpO2: 97% 100%   Vitals:   05/15/20 1530 05/15/20 1547 05/16/20 0657 05/16/20 0721  BP: (!) 157/66 (!) 142/75 122/75 (!) 126/91  Pulse:   88 89  Resp:  (!) $Re'22 18 16  'hwf$ Temp:  98.1 F (36.7 C) 97.7 F (36.5 C) 98.1 F (36.7 C)  TempSrc:  Oral Oral Oral  SpO2:  100% 97% 100%  Weight:  104 kg    Height:        General: Pt is alert, awake, not in acute distress Patient was sitting in a chair.  Looks comfortable.  He has multiple chronic issues otherwise fairly stable. Cardiovascular: RRR, S1/S2 +, no rubs, no gallops Respiratory: CTA bilaterally, no wheezing, no rhonchi Abdominal: Soft, NT, ND, bowel sounds + Extremities: Patient has  ulcerated fingertips both hands.  He has AV fistula on the right forearm. Patient has some swelling and edema  of the right thigh.  Patient also has palpable lymphadenopathy right inguinal more than left inguinal.    The results of significant diagnostics from this hospitalization (including imaging, microbiology, ancillary and laboratory) are listed below for reference.     Microbiology: Recent Results (from the past 240 hour(s))  SARS CORONAVIRUS 2 (TAT 6-24 HRS) Nasopharyngeal Nasopharyngeal Swab     Status: None   Collection Time: 05/12/20  8:38 PM   Specimen: Nasopharyngeal Swab  Result Value Ref Range Status   SARS Coronavirus 2 NEGATIVE NEGATIVE Final    Comment: (NOTE) SARS-CoV-2 target nucleic acids are NOT DETECTED.  The SARS-CoV-2 RNA is generally detectable in upper and lower respiratory specimens during the acute phase of infection. Negative results do not preclude SARS-CoV-2 infection, do not rule out co-infections with other pathogens, and should not be used as the sole basis for treatment or other patient management decisions. Negative results must be combined with clinical observations, patient history, and epidemiological information. The expected result is Negative.  Fact Sheet for Patients: SugarRoll.be  Fact Sheet for Healthcare Providers: https://www.woods-mathews.com/  This test is not yet approved or cleared by the [place] FDA and  has been authorized for detection and/or diagnosis of SARS-CoV-2 by FDA under an Emergency Use Authorization (EUA). This EUA will remain  in effect (meaning this test can be used) for the duration of the COVID-19 declaration under Se ction 564(b)(1) of the Act, 21 U.S.C. section 360bbb-3(b)(1), unless the authorization is terminated or revoked sooner.  Performed at [facility], [place] [address], [place], [place] [address]   Blood culture (routine x 2)     Status: None  (Preliminary result)   Collection Time: 05/12/20 10:06 PM   Specimen: BLOOD  Result Value Ref Range Status   Specimen Description BLOOD LEFT ARM  Final   Special Requests   Final    BOTTLES DRAWN AEROBIC AND ANAEROBIC Blood Culture adequate volume   Culture   Final    NO GROWTH 4 DAYS Performed at [facility], [place] [address] [place], [place] [address]    Report Status PENDING  Incomplete  Culture, blood (Routine X 2) w Reflex to ID Panel     Status: None (Preliminary result)   Collection Time: 05/13/20  5:26 AM   Specimen: BLOOD  Result Value Ref Range Status   Specimen Description BLOOD LEFT ARM  Final   Special Requests   Final    BOTTLES DRAWN AEROBIC ONLY Blood Culture adequate volume   Culture   Final    NO GROWTH 3 DAYS Performed at [facility], [address], [place], [place] [address]    Report Status PENDING  Incomplete  Surgical pcr screen     Status: None   Collection Time: 05/14/20  8:44 AM   Specimen: Nasal Mucosa; Nasal Swab  Result Value Ref Range Status   MRSA, PCR NEGATIVE NEGATIVE Final   Staphylococcus aureus NEGATIVE NEGATIVE Final    Comment: (NOTE) The Xpert SA Assay (FDA approved for NASAL specimens in patients 76 years of age and older), is one component of a comprehensive surveillance program. It is not intended to diagnose infection nor to guide or monitor treatment. Performed at [facility], [place] [address] [place], [place] [address]   Aerobic/Anaerobic Culture w Gram Stain (surgical/deep wound)     Status: None (Preliminary result)   Collection Time: 05/14/20  5:44 PM   Specimen: Bone; Tissue  Result Value Ref  Range Status   Specimen Description BONE LEFT HEEL ULCER  Final   Special Requests NONE  Final   Gram Stain NO WBC SEEN NO ORGANISMS SEEN   Final   Culture   Final    NO GROWTH < 24 HOURS Performed at [facility], [place] [address], [place], [place] [address]    Report Status PENDING  Incomplete      Labs: BNP (last 3 results) No results for input(s): BNP in the last 8760 hours. Basic Metabolic Panel: Recent Labs  Lab 05/12/20 1319 05/13/20 0526 05/14/20 1633 05/15/20 1326  NA 139 136 137 136  K 5.0 5.2* 4.6 4.3  CL 99 98 102 96*  CO2 24 23  --  22  GLUCOSE 96 112* 131* 141*  BUN 41* 45* 34* 31*  CREATININE 9.56* 10.13* 8.30* 7.81*  CALCIUM 8.7* 8.5*  --  7.9*  MG  --  2.3  --   --   PHOS  --  7.6*  --  5.8*   Liver Function Tests: Recent Labs  Lab 05/12/20 1319 05/13/20 0526 05/15/20 1326  AST 14* 13*  --   ALT 13 13  --   ALKPHOS 261* 215*  --   BILITOT 0.7 0.8  --   PROT 6.8 6.6  --   ALBUMIN 2.4* 2.2* 2.4*   No results for input(s): LIPASE, AMYLASE in the last 168 hours. No results for input(s): AMMONIA in the last 168 hours. CBC: Recent Labs  Lab 05/12/20 1319 05/13/20 0526 05/14/20 1633 05/15/20 1326  WBC 7.3 7.6  --  6.2  NEUTROABS 5.6 5.8  --   --   HGB 10.8* 10.4* 11.9* 10.9*  HCT 37.6* 34.3* 35.0* 36.8*  MCV 101.6* 97.4  --  97.1  PLT 321 353  --  360   Cardiac Enzymes: No results for input(s): CKTOTAL, CKMB, CKMBINDEX, TROPONINI in the last 168 hours. BNP: Invalid input(s): POCBNP CBG: Recent Labs  Lab 05/13/20 2156 05/14/20 0053 05/14/20 0509 05/14/20 1819 05/15/20 0909  GLUCAP 161* 168* 193* 106* 114*   D-Dimer No results for input(s): DDIMER in the last 72 hours. Hgb A1c No results for input(s): HGBA1C in the last 72 hours. Lipid Profile No results for input(s): CHOL, HDL, LDLCALC, TRIG, CHOLHDL, LDLDIRECT in the last 72 hours. Thyroid function studies No results for input(s): TSH, T4TOTAL, T3FREE, THYROIDAB in the last 72 hours.  Invalid input(s): FREET3 Anemia work up No results for input(s): VITAMINB12, FOLATE, FERRITIN, TIBC, IRON, RETICCTPCT in the last 72 hours. Urinalysis No results found for: COLORURINE, APPEARANCEUR, [place], [place], [place], [place], [place], [place], PROTEINUR, UROBILINOGEN, NITRITE,  LEUKOCYTESUR Sepsis Labs Invalid input(s): PROCALCITONIN,  WBC,  LACTICIDVEN Microbiology Recent Results (from the past 240 hour(s))  SARS CORONAVIRUS 2 (TAT 6-24 HRS) Nasopharyngeal Nasopharyngeal Swab     Status: None   Collection Time: 05/12/20  8:38 PM   Specimen: Nasopharyngeal Swab  Result Value Ref Range Status   SARS Coronavirus 2 NEGATIVE NEGATIVE Final    Comment: (NOTE) SARS-CoV-2 target nucleic acids are NOT DETECTED.  The SARS-CoV-2 RNA is generally detectable in upper and lower respiratory specimens during the acute phase of infection. Negative results do not preclude SARS-CoV-2 infection, do not rule out co-infections with other pathogens, and should not be used as the sole basis for treatment or other patient management decisions. Negative results must be combined with clinical observations, patient history, and epidemiological information. The expected result is Negative.  Fact Sheet for Patients: SugarRoll.be  Fact Sheet for Healthcare Providers: https://www.woods-mathews.com/  This test is not yet approved or cleared by the [place] FDA and  has been authorized for detection and/or diagnosis of SARS-CoV-2 by FDA under an Emergency Use Authorization (EUA). This EUA will remain  in effect (meaning this test can be used) for the duration of the COVID-19 declaration under Se ction 564(b)(1) of the Act, 21 U.S.C. section 360bbb-3(b)(1), unless the authorization is terminated or revoked sooner.  Performed at [facility], [place] [address], [place], [place] [address]   Blood culture (routine x 2)     Status: None (Preliminary result)   Collection Time: 05/12/20 10:06 PM   Specimen: BLOOD  Result Value Ref Range Status   Specimen Description BLOOD LEFT ARM  Final   Special Requests   Final    BOTTLES DRAWN AEROBIC AND ANAEROBIC Blood Culture adequate volume   Culture   Final    NO GROWTH 4 DAYS Performed at  [facility], [place] [address], [place], [place] [address]    Report Status PENDING  Incomplete  Culture, blood (Routine X 2) w Reflex to ID Panel     Status: None (Preliminary result)   Collection Time: 05/13/20  5:26 AM   Specimen: BLOOD  Result Value Ref Range Status   Specimen Description BLOOD LEFT ARM  Final   Special Requests   Final    BOTTLES DRAWN AEROBIC ONLY Blood Culture adequate volume   Culture   Final    NO GROWTH 3 DAYS Performed at [facility], [address] [place], [place] [address]    Report Status PENDING  Incomplete  Surgical pcr screen     Status: None   Collection Time: 05/14/20  8:44 AM   Specimen: Nasal Mucosa; Nasal Swab  Result Value Ref Range Status   MRSA, PCR NEGATIVE NEGATIVE Final   Staphylococcus aureus NEGATIVE NEGATIVE Final    Comment: (NOTE) The Xpert SA Assay (FDA approved for NASAL specimens in patients 48 years of age and older), is one component of a comprehensive surveillance program. It is not intended to diagnose infection nor to guide or monitor treatment. Performed at [facility], [place] [address] [place], [place] [address]   Aerobic/Anaerobic Culture w Gram Stain (surgical/deep wound)     Status: None (Preliminary result)   Collection Time: 05/14/20  5:44 PM   Specimen: Bone; Tissue  Result Value Ref Range Status   Specimen Description BONE LEFT HEEL ULCER  Final   Special Requests NONE  Final   Gram Stain NO WBC SEEN NO ORGANISMS SEEN   Final   Culture   Final    NO GROWTH < 24 HOURS Performed at [facility], [place] [address] [place], [place] [address]    Report Status PENDING  Incomplete     Time coordinating discharge:  35 minutes  SIGNED:   Barb Merino, MD  [facility] 05/16/2020, 2:21 PM

## 2020-05-16 NOTE — Care Management (Signed)
ED RNCM contacted by Tammy RN on 5N that patient  Did not receive bedside commode, and none available in floor stock according to RN. RNCM  Faxed order to [facility] [phone] to be delivered to patient's home. Patent was updated by RN.

## 2020-05-16 NOTE — Care Management Important Message (Signed)
Important Message  Patient Details  Name: KIYA ELEK MRN: [id number] Date of Birth: Apr 01, 1965   Medicare Important Message Given:  Yes     Barb Merino Lamoine Fredricksen 05/16/2020, 2:48 PM

## 2020-05-16 NOTE — Progress Notes (Addendum)
[facility] Progress Note   Subjective:   Patient seen and examined in room.  Family at bedside.  Sitting in bedside chair.  Overall doing ok.  Reports anxiety increased after being in the hospital a few days.    Objective Vitals:   05/15/20 1530 05/15/20 1547 05/16/20 0657 05/16/20 0721  BP: (!) 157/66 (!) 142/75 122/75 (!) 126/91  Pulse:   88 89  Resp:  (!) '22 18 16  '$ Temp:  98.1 F (36.7 C) 97.7 F (36.5 C) 98.1 F (36.7 C)  TempSrc:  Oral Oral Oral  SpO2:  100% 97% 100%  Weight:  104 kg    Height:       Physical Exam General:well developed, obese male in NAD Heart:RRR, no mrg Lungs:CTAB, nml WOB on RA Abdomen:soft, NTND Extremities:+woody edema, L foot dressed Dialysis Access: RU AVF +b/t   Filed Weights   05/13/20 1445 05/15/20 1234 05/15/20 1547  Weight: 106.3 kg 107 kg 104 kg    Intake/Output Summary (Last 24 hours) at 05/16/2020 1135 Last data filed at 05/15/2020 1547 Gross per 24 hour  Intake --  Output 3000 ml  Net -3000 ml    Additional Objective Labs: Basic Metabolic Panel: Recent Labs  Lab 05/12/20 1319 05/13/20 0526 05/14/20 1633 05/15/20 1326  NA 139 136 137 136  K 5.0 5.2* 4.6 4.3  CL 99 98 102 96*  CO2 24 23  --  22  GLUCOSE 96 112* 131* 141*  BUN 41* 45* 34* 31*  CREATININE 9.56* 10.13* 8.30* 7.81*  CALCIUM 8.7* 8.5*  --  7.9*  PHOS  --  7.6*  --  5.8*   Liver Function Tests: Recent Labs  Lab 05/12/20 1319 05/13/20 0526 05/15/20 1326  AST 14* 13*  --   ALT 13 13  --   ALKPHOS 261* 215*  --   BILITOT 0.7 0.8  --   PROT 6.8 6.6  --   ALBUMIN 2.4* 2.2* 2.4*   CBC: Recent Labs  Lab 05/12/20 1319 05/13/20 0526 05/14/20 1633 05/15/20 1326  WBC 7.3 7.6  --  6.2  NEUTROABS 5.6 5.8  --   --   HGB 10.8* 10.4* 11.9* 10.9*  HCT 37.6* 34.3* 35.0* 36.8*  MCV 101.6* 97.4  --  97.1  PLT 321 353  --  360   CBG: Recent Labs  Lab 05/13/20 2156 05/14/20 0053 05/14/20 0509 05/14/20 1819 05/15/20 0909  GLUCAP  161* 168* 193* 106* 114*    Medications: . sodium chloride 250 mL (05/14/20 0517)  . ceFEPime (MAXIPIME) IV    . [START ON 05/17/2020] ceFEPime (MAXIPIME) IV    . vancomycin 1,000 mg (05/15/20 1702)   . atorvastatin  10 mg Oral QPM  . calcium acetate  2,668 mg Oral TID WC  . Chlorhexidine Gluconate Cloth  6 each Topical Q0600  . Chlorhexidine Gluconate Cloth  6 each Topical Q0600  . cinacalcet  30 mg Oral Q supper  . cycloSPORINE  1 drop Both Eyes BID  . febuxostat  40 mg Oral QPM  . ferric citrate  420 mg Oral BID AC  . insulin aspart  0-6 Units Subcutaneous Q4H  . metroNIDAZOLE  500 mg Oral Q8H  . midodrine  10 mg Oral Q T,Th,Sa-HD  . multivitamin  1 tablet Oral QHS  . sodium chloride flush  3 mL Intravenous Q12H    Dialysis Orders: TTS -[facility] 3.5 hrs, BFR400, DFR600, [id number]  Access:LU AVF Hep 6000 loading, 500 qhr Hect 4  qHD Aranesp 70 qThurs Venofer '200mg'$  qHD   Assessment/Plan: 1. Diabetic foot wounds- suspected osteomyelitis onMRI (2/16) of plantar aspect of L heel. Xray R foot with no evidence OM,?early ulcerative changes. Failed outpatient antibiotic therapy.BC with NGTD.OnIV vanc, rocephin and flagyl.B/l TBI decreased. Seen by [facility] who does not recommend any further arterial work up at this time.Debridement/bone Bxcompleted 2/23 by Dr. Jacqualyn Posey.  WC with NGTD. Pathology pending. Pain well controlled. ID consulted, recommend Vanc and cefepime for 6wks total (= 17 doses TTS, last doses 06/24/20). 2. ESRD- On HD TTS.Orders written for HD tomorrow per regular schedule. Last K 4.3. Holding heparin due to recent surgery. Looks like he is going home today. Will contact OP HD unit for abx as above.  3. Hypertension/volume- Blood pressure in goal. On midodrine for BP support with HD. Lower extremity edema noted on exam.Continue toUF as tolerated. Per weights under dry, may need to be lowered on d/c. 4. Anemiaof CKD- Hgb 10.9.Will  restart weekly ESA. HoldIV iron in setting of possible osteomyelitis. Follow labs post surgery.  5. Secondary Hyperparathyroidism -Calcium in goal. Phos closer to goal.Continue VDRA, sensipar '30mg'$  qd, and Auryxia 2AC TID, 1 with snacks and phoslo 4AC TID. 6. Nutrition- Renal diet w/fluid restrictions.  7. DMT2 - on insulin, per primary 8. Gout- on uloric 9. Anxiety - per primary    Jen Mow, PA-C [facility] 05/16/2020,11:35 AM  LOS: 4 days   Pt seen, examined and agree w assess/plan as above with additions as indicated.  [facility] 05/16/2020, 2:28 PM

## 2020-05-16 NOTE — Progress Notes (Signed)
Physical Therapy Treatment Patient Details Name: [name] [name] MRN: [id number] DOB: [birth year] Today's Date: 05/16/2020    History of Present Illness Pt is a 55 y.o. 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 s/p debridement and bone biopsy left foot 05/14/20. PMH includes ESRD (HD TTS), DM2, HTN, gout, peripheral neuropathy, CHF, s/p failed renal transplant.    PT Comments    Patient now s/p surgery on left foot and requires use of CAM boot for mobility. Reports some pain in bil feet but tolerable. Requires Min A for standing and short distance ambulation with use of RW for support and cues for sequencing. Pt fatigues quickly reporting pain in back due to wearing CAM boot. Encouraged pt to wear a comfy tennis shoe on other foot to help alleviate this height discrepancy. Will need to increase activity to improve overall strength/mobility prior to return home and needs to negotiate stairs as well. Encouraged use of RW instead of cane for safety and to decrease fall risk. Will continue to follow.    Follow Up Recommendations  Home health PT;Supervision for mobility/OOB     Equipment Recommendations  3in1 (PT)    Recommendations for Other Services       Precautions / Restrictions Precautions Precautions: Fall Required Braces or Orthoses: Other Brace Other Brace: Cam boot LLE at all times Restrictions Weight Bearing Restrictions: Yes LLE Weight Bearing: Weight bearing as tolerated Other Position/Activity Restrictions: with CAM boot    Mobility  Bed Mobility               General bed mobility comments: Sitting EOB upon PT arrival.    Transfers Overall transfer level: Needs assistance Equipment used: Rolling walker (2 wheeled) Transfers: Sit to/from Stand Sit to Stand: Min assist         General transfer comment: Cues for hand placement and sequencing, Min A to elevate and for steadying assist once upright  due to posterior bias. Stood from [employer], transferred to chair post ambulation.  Ambulation/Gait Ambulation/Gait assistance: Min assist Gait Distance (Feet): 18 Feet Assistive device: Rolling walker (2 wheeled) Gait Pattern/deviations: Step-through pattern;Decreased stride length;Antalgic;Trunk flexed Gait velocity: Decreased   General Gait Details: Slow, antalgic gait with RW and intermittent min A for balance due to posterior bias; cues for sequencing with RW. Fatigues quickly with back spasms and pain.   Stairs             Wheelchair Mobility    Modified Rankin (Stroke Patients Only)       Balance Overall balance assessment: Needs assistance Sitting-balance support: Feet supported;No upper extremity supported Sitting balance-Leahy Scale: Good     Standing balance support: During functional activity Standing balance-Leahy Scale: Poor Standing balance comment: Reliant on UE support and Min A for balance esp for dynamic tasks                            Cognition Arousal/Alertness: Awake/alert Behavior During Therapy: WFL for tasks assessed/performed Overall Cognitive Status: Within Functional Limits for tasks assessed                                 General Comments: WFL for simple tasks; pt hyperverbose and at times distracted by this requiring cues to stay on task      Exercises      General Comments General  comments (skin integrity, edema, etc.): Mother present and supportive.      Pertinent Vitals/Pain Pain Assessment: 0-10 Pain Score: 8  Pain Location: L foot Pain Descriptors / Indicators: Operative site guarding;Sore Pain Intervention(s): Monitored during session;Repositioned;Limited activity within patient's tolerance    Home Living                      Prior Function            PT Goals (current goals can now be found in the care plan section) Progress towards PT goals: Progressing toward goals     Frequency    Min 3X/week      PT Plan Current plan remains appropriate    Co-evaluation              AM-PAC PT "6 Clicks" Mobility   Outcome Measure  Help needed turning from your back to your side while in a flat bed without using bedrails?: None Help needed moving from lying on your back to sitting on the side of a flat bed without using bedrails?: None Help needed moving to and from a bed to a chair (including a wheelchair)?: A Little Help needed standing up from a chair using your arms (e.g., wheelchair or bedside chair)?: A Little Help needed to walk in hospital room?: A Little Help needed climbing 3-5 steps with a railing? : A Lot 6 Click Score: 19    End of Session Equipment Utilized During Treatment: Gait belt Activity Tolerance: Patient tolerated treatment well;Patient limited by fatigue Patient left: in chair;with call bell/phone within reach;with family/visitor present Nurse Communication: Mobility status PT Visit Diagnosis: Other abnormalities of gait and mobility (R26.89);Muscle weakness (generalized) (M62.81);Pain Pain - Right/Left: Left Pain - part of body: Ankle and joints of foot     Time: [id number] PT Time Calculation (min) (ACUTE ONLY): 25 min  Charges:  $Gait Training: 8-22 mins                     Marisa Severin, PT, DPT Acute Rehabilitation Services Pager [phone] Office [place] 05/16/2020, 10:11 AM

## 2020-05-16 NOTE — Progress Notes (Signed)
Subjective: POD #2 s/p left heel ulcer debridement, excision, bone biopsy with graft application.  States he is doing well not having any significant pain but is ready to go home.  He denies any fevers or chills.     Objective: AAO x3, NAD Left: Dressing clean, dry, intact with any strikethrough. Right: Small eschar plantar aspect the heel without any drainage or pus or any fluctuation crepitation.  No edema, erythema.  Minimal tenderness palpation of this area.  Overall unchanged No pain with calf compression, swelling, warmth, erythema  Assessment: POD #1 s/p Left foot ulcer debridement; Right heel ulcer  Plan: For the left foot keep the dressing clean, dry, intact.  I will plan on seeing him on Monday in the office for dressing change.  This appointment was scheduled.  Continue elevation.  Antibiotics per infectious disease.  On the right side continue a small amount of Betadine and offloading.  I see him in the office I will try to make an offloading pad, orthotic for him.  From podiatry standpoint able to be discharged with close follow-up.   Celesta Gentile, DPM

## 2020-05-16 NOTE — TOC Transition Note (Signed)
Transition of Care [facility]) - CM/SW Discharge Note   Patient Details  Name: [name] [name] MRN: [id number] Date of Birth: 28-Nov-1965  Transition of Care [facility]) CM/SW Contact:  Sharin Mons, RN Phone Number: 05/16/2020, 4:57 PM   Clinical Narrative:    Patient will DC to: home Anticipated DC date: 05/16/2020 Family notified: yes Transport by: car   Admitted for left foot osteomyelitis secondary to diabetic foot ulcer. Hx of ESRD on HD, DM 2, gout peripheral neuropathy, diastolic.systolic CHF .    From home with family. Per MD patient ready for DC today. RN, patient, and patient's family notified of DC. Pt with [place] orders noted. Pt agreeable to [facility] services. Pt without preference. Referral made with [facility] and accepted. DME : 3 in1/[facility] will be provided to pt from floor stock prior to d/c. Pt without Rx med concerns. Post hospital f/u noted on AVS.  RNCM will sign off for now as intervention is no longer needed. Please consult [place] again if new needs arise.    Final next level of care: [name] Barriers to Discharge: No Barriers Identified   Patient Goals and CMS Choice     Choice offered to / list presented to : Patient  Discharge Placement                       Discharge Plan and Services                DME Arranged: 3-N-1 DME Agency: [employer] Date DME Agency Contacted: 05/16/20 Time DME Agency Contacted: [phone] Representative spoke with at DME Agency: Nurse to provide from [place]: PT [place]: [place] Date [place]: 05/16/20 Time [place]: 1656 Representative spoke with at [place]: [place] Determinants of Health ([place]) Interventions     Readmission Risk Interventions No flowsheet data found.

## 2020-05-17 LAB — CULTURE, BLOOD (ROUTINE X 2)
Culture: NO GROWTH
Special Requests: ADEQUATE

## 2020-05-18 DIAGNOSIS — Z4781 Encounter for orthopedic aftercare following surgical amputation: Secondary | ICD-10-CM | POA: Diagnosis not present

## 2020-05-18 DIAGNOSIS — L97429 Non-pressure chronic ulcer of left heel and midfoot with unspecified severity: Secondary | ICD-10-CM | POA: Diagnosis not present

## 2020-05-18 DIAGNOSIS — Z792 Long term (current) use of antibiotics: Secondary | ICD-10-CM | POA: Diagnosis not present

## 2020-05-18 DIAGNOSIS — E1142 Type 2 diabetes mellitus with diabetic polyneuropathy: Secondary | ICD-10-CM | POA: Diagnosis not present

## 2020-05-18 DIAGNOSIS — Z992 Dependence on renal dialysis: Secondary | ICD-10-CM | POA: Diagnosis not present

## 2020-05-18 DIAGNOSIS — Z7982 Long term (current) use of aspirin: Secondary | ICD-10-CM | POA: Diagnosis not present

## 2020-05-18 DIAGNOSIS — N186 End stage renal disease: Secondary | ICD-10-CM | POA: Diagnosis not present

## 2020-05-18 DIAGNOSIS — Z89011 Acquired absence of right thumb: Secondary | ICD-10-CM | POA: Diagnosis not present

## 2020-05-18 DIAGNOSIS — Z89022 Acquired absence of left finger(s): Secondary | ICD-10-CM | POA: Diagnosis not present

## 2020-05-18 DIAGNOSIS — M869 Osteomyelitis, unspecified: Secondary | ICD-10-CM | POA: Diagnosis not present

## 2020-05-18 DIAGNOSIS — Z89021 Acquired absence of right finger(s): Secondary | ICD-10-CM | POA: Diagnosis not present

## 2020-05-18 DIAGNOSIS — E1151 Type 2 diabetes mellitus with diabetic peripheral angiopathy without gangrene: Secondary | ICD-10-CM | POA: Diagnosis not present

## 2020-05-18 DIAGNOSIS — L97419 Non-pressure chronic ulcer of right heel and midfoot with unspecified severity: Secondary | ICD-10-CM | POA: Diagnosis not present

## 2020-05-18 DIAGNOSIS — N2581 Secondary hyperparathyroidism of renal origin: Secondary | ICD-10-CM | POA: Diagnosis not present

## 2020-05-18 DIAGNOSIS — E1169 Type 2 diabetes mellitus with other specified complication: Secondary | ICD-10-CM | POA: Diagnosis not present

## 2020-05-18 DIAGNOSIS — I7300 Raynaud's syndrome without gangrene: Secondary | ICD-10-CM | POA: Diagnosis not present

## 2020-05-18 DIAGNOSIS — E11621 Type 2 diabetes mellitus with foot ulcer: Secondary | ICD-10-CM | POA: Diagnosis not present

## 2020-05-18 DIAGNOSIS — E1122 Type 2 diabetes mellitus with diabetic chronic kidney disease: Secondary | ICD-10-CM | POA: Diagnosis not present

## 2020-05-18 DIAGNOSIS — E7800 Pure hypercholesterolemia, unspecified: Secondary | ICD-10-CM | POA: Diagnosis not present

## 2020-05-18 DIAGNOSIS — I120 Hypertensive chronic kidney disease with stage 5 chronic kidney disease or end stage renal disease: Secondary | ICD-10-CM | POA: Diagnosis not present

## 2020-05-18 DIAGNOSIS — M1030 Gout due to renal impairment, unspecified site: Secondary | ICD-10-CM | POA: Diagnosis not present

## 2020-05-18 DIAGNOSIS — Z452 Encounter for adjustment and management of vascular access device: Secondary | ICD-10-CM | POA: Diagnosis not present

## 2020-05-18 DIAGNOSIS — Z9641 Presence of insulin pump (external) (internal): Secondary | ICD-10-CM | POA: Diagnosis not present

## 2020-05-18 DIAGNOSIS — D631 Anemia in chronic kidney disease: Secondary | ICD-10-CM | POA: Diagnosis not present

## 2020-05-18 DIAGNOSIS — Z9181 History of falling: Secondary | ICD-10-CM | POA: Diagnosis not present

## 2020-05-18 LAB — CULTURE, BLOOD (ROUTINE X 2)
Culture: NO GROWTH
Special Requests: ADEQUATE

## 2020-05-19 ENCOUNTER — Other Ambulatory Visit: Payer: Self-pay

## 2020-05-19 ENCOUNTER — Ambulatory Visit (INDEPENDENT_AMBULATORY_CARE_PROVIDER_SITE_OTHER): Payer: Medicare Other | Admitting: Podiatry

## 2020-05-19 DIAGNOSIS — Z992 Dependence on renal dialysis: Secondary | ICD-10-CM | POA: Diagnosis not present

## 2020-05-19 DIAGNOSIS — N186 End stage renal disease: Secondary | ICD-10-CM | POA: Diagnosis not present

## 2020-05-19 DIAGNOSIS — E1165 Type 2 diabetes mellitus with hyperglycemia: Secondary | ICD-10-CM

## 2020-05-19 DIAGNOSIS — L97524 Non-pressure chronic ulcer of other part of left foot with necrosis of bone: Secondary | ICD-10-CM

## 2020-05-20 DIAGNOSIS — N2581 Secondary hyperparathyroidism of renal origin: Secondary | ICD-10-CM | POA: Diagnosis not present

## 2020-05-20 DIAGNOSIS — M86172 Other acute osteomyelitis, left ankle and foot: Secondary | ICD-10-CM | POA: Diagnosis not present

## 2020-05-20 DIAGNOSIS — U071 COVID-19: Secondary | ICD-10-CM | POA: Diagnosis not present

## 2020-05-20 DIAGNOSIS — D631 Anemia in chronic kidney disease: Secondary | ICD-10-CM | POA: Diagnosis not present

## 2020-05-20 DIAGNOSIS — N186 End stage renal disease: Secondary | ICD-10-CM | POA: Diagnosis not present

## 2020-05-21 LAB — AEROBIC/ANAEROBIC CULTURE W GRAM STAIN (SURGICAL/DEEP WOUND): Gram Stain: NONE SEEN

## 2020-05-21 NOTE — Progress Notes (Signed)
Subjective: [name] [name] is a 55 y.o. is seen today in office s/p left heel ulcer debridement preformed on 05/14/2020.  Patient is here for dressing change.  States he has been doing well with minimal discomfort.  Denies any systemic complaints such as fevers, chills, nausea, vomiting. No calf pain, chest pain, shortness of breath.   Objective: General: No acute distress, AAOx3  DP/PT pulses palpable 2/4, CRT < 3 sec to all digits.  LEFT foot: Graft is intact to the plantar aspect of the heel without any surrounding erythema, ascending cellulitis there is no drainage or pus or fluctuation crepitation but there is no malodor.   RIGHT foot: Eschar on the plantar aspect of the heel without any drainage or pus.  Is starting to dry.  No surrounding erythema, ascending cellulitis.  No fluctuation crepitation there is malodor.   No other areas of tenderness to bilateral lower extremities.  No other open lesions or pre-ulcerative lesions.  No pain with calf compression, swelling, warmth, erythema.   Assessment and Plan:  Status post left heel ulcer debridement, doing well with no complications   -Treatment options discussed including all alternatives, risks, and complications -Dressing was changed on the left side.  Surgical wound was applied followed by Adaptic and a dry sterile dressing.  The dressing intact, plan on changing this on Thursday. -Continue Betadine wet-to-dry on the right side.  Offloading pad dispensed. -Ice/elevation -Pain medication as needed. -Monitor for any clinical signs or symptoms of infection and DVT/PE and directed to call the office immediately should any occur or go to the ER. -Follow-up as scheduled for dressing change or sooner if any problems arise. In the meantime, encouraged to call the office with any questions, concerns, change in symptoms.   Celesta Gentile, DPM

## 2020-05-22 ENCOUNTER — Ambulatory Visit (INDEPENDENT_AMBULATORY_CARE_PROVIDER_SITE_OTHER): Payer: Medicare Other | Admitting: Podiatry

## 2020-05-22 ENCOUNTER — Other Ambulatory Visit: Payer: Self-pay

## 2020-05-22 DIAGNOSIS — M86172 Other acute osteomyelitis, left ankle and foot: Secondary | ICD-10-CM | POA: Diagnosis not present

## 2020-05-22 DIAGNOSIS — Z1159 Encounter for screening for other viral diseases: Secondary | ICD-10-CM | POA: Diagnosis not present

## 2020-05-22 DIAGNOSIS — E08621 Diabetes mellitus due to underlying condition with foot ulcer: Secondary | ICD-10-CM

## 2020-05-22 DIAGNOSIS — N186 End stage renal disease: Secondary | ICD-10-CM | POA: Diagnosis not present

## 2020-05-22 DIAGNOSIS — U071 COVID-19: Secondary | ICD-10-CM | POA: Diagnosis not present

## 2020-05-22 DIAGNOSIS — N2581 Secondary hyperparathyroidism of renal origin: Secondary | ICD-10-CM | POA: Diagnosis not present

## 2020-05-22 DIAGNOSIS — L97411 Non-pressure chronic ulcer of right heel and midfoot limited to breakdown of skin: Secondary | ICD-10-CM

## 2020-05-22 DIAGNOSIS — E1165 Type 2 diabetes mellitus with hyperglycemia: Secondary | ICD-10-CM

## 2020-05-22 DIAGNOSIS — L97524 Non-pressure chronic ulcer of other part of left foot with necrosis of bone: Secondary | ICD-10-CM

## 2020-05-22 DIAGNOSIS — E119 Type 2 diabetes mellitus without complications: Secondary | ICD-10-CM | POA: Diagnosis not present

## 2020-05-22 DIAGNOSIS — D631 Anemia in chronic kidney disease: Secondary | ICD-10-CM | POA: Diagnosis not present

## 2020-05-22 NOTE — Patient Instructions (Signed)
For the home health nurse-   On the right foot clean wound with saline. Apply betadine to the wound followed by dry dressing. Offload the ulcer  On the left foot clean with saline. Apply adaptic followed by surgical petroleum jelly and then cover with 4x4, kerlix, ACE.   Change dressing 3 x week.

## 2020-05-23 DIAGNOSIS — M869 Osteomyelitis, unspecified: Secondary | ICD-10-CM | POA: Diagnosis not present

## 2020-05-23 DIAGNOSIS — Z4781 Encounter for orthopedic aftercare following surgical amputation: Secondary | ICD-10-CM | POA: Diagnosis not present

## 2020-05-23 DIAGNOSIS — Z79891 Long term (current) use of opiate analgesic: Secondary | ICD-10-CM | POA: Diagnosis not present

## 2020-05-23 DIAGNOSIS — G894 Chronic pain syndrome: Secondary | ICD-10-CM | POA: Diagnosis not present

## 2020-05-23 DIAGNOSIS — E1169 Type 2 diabetes mellitus with other specified complication: Secondary | ICD-10-CM | POA: Diagnosis not present

## 2020-05-23 DIAGNOSIS — M79609 Pain in unspecified limb: Secondary | ICD-10-CM | POA: Diagnosis not present

## 2020-05-23 DIAGNOSIS — I7301 Raynaud's syndrome with gangrene: Secondary | ICD-10-CM | POA: Diagnosis not present

## 2020-05-23 DIAGNOSIS — L97419 Non-pressure chronic ulcer of right heel and midfoot with unspecified severity: Secondary | ICD-10-CM | POA: Diagnosis not present

## 2020-05-23 DIAGNOSIS — E11621 Type 2 diabetes mellitus with foot ulcer: Secondary | ICD-10-CM | POA: Diagnosis not present

## 2020-05-23 DIAGNOSIS — L97429 Non-pressure chronic ulcer of left heel and midfoot with unspecified severity: Secondary | ICD-10-CM | POA: Diagnosis not present

## 2020-05-23 DIAGNOSIS — G629 Polyneuropathy, unspecified: Secondary | ICD-10-CM | POA: Diagnosis not present

## 2020-05-24 DIAGNOSIS — D631 Anemia in chronic kidney disease: Secondary | ICD-10-CM | POA: Diagnosis not present

## 2020-05-24 DIAGNOSIS — U071 COVID-19: Secondary | ICD-10-CM | POA: Diagnosis not present

## 2020-05-24 DIAGNOSIS — M86172 Other acute osteomyelitis, left ankle and foot: Secondary | ICD-10-CM | POA: Diagnosis not present

## 2020-05-24 DIAGNOSIS — N186 End stage renal disease: Secondary | ICD-10-CM | POA: Diagnosis not present

## 2020-05-24 DIAGNOSIS — N2581 Secondary hyperparathyroidism of renal origin: Secondary | ICD-10-CM | POA: Diagnosis not present

## 2020-05-25 NOTE — Progress Notes (Signed)
Cardiology Office Note:    Date:  05/26/2020   ID:  [name] [name], DOB [birth year], MRN [id number]  PCP:  [name] Liberty, MD  Cardiologist:  [name] Grooms, MD   Referring MD: [name] Liberty, MD   Chief Complaint  Patient presents with  . Congestive Heart Failure         History of Present Illness:    [name] [name] is a 56 y.o. male with a hx of non-ischemic cardiomyopathywith chronic systolic heart failure managed by volume removal during hemodialysis, ESRDon chronic dialysis, DM II, obesity, Raynaud's/vasculitis with digital loss in both hands, massive bilateral lower extremity edema possibly related to compression of femoral and or iliac veins related to compression from lymph nodes, and significant hypotension requiring midodrine therapy to support blood pressure.  Recent hospitalization for cellulitis/osteomyelitis with left lower extremity/foot debridement.  [name] is accompanied by his mother.  Recently hospitalized for left foot deep infection.  Had surgical debridement.  He is not able ambulate.  He has a boot on his left foot.  Major concern is chronic shortness of breath.  Says it fluctuates in a manner that is not predictable.  He denies chest pain.  He denies racing heart.  He has not had syncope.  During the interview, if not verbally challenged he will fall asleep.    Past Medical History:  Diagnosis Date  . Anal infection    posterior anal canal  . Anemia, chronic renal failure   . Atrophic kidney    BILATERAL  . DM type 2 causing ESRD [facility]    Nephrologist-- dr [name] [name] [facility])--  on hemodialysis since June 2012 at  [name]  TTS  . Hemodialysis patient [facility])    at [place] on Tues/ Thur/Sat/schedule  . Hemorrhoids   . Hepatitis B antibody positive   . History of pleural effusion    bilateral  . Hyperparathyroidism, secondary renal ([place])   . Hypertension   . Ischemic cardiomyopathy    per echo 07-01-2014   ef 45%  . LAFB (left anterior fascicular block)   . Peripheral neuropathy   . Systolic and diastolic CHF, chronic ([place])    CARDIOLOGIST-  DR [name] [name] ([place])  AND DR [name] [name] ([name])    Past Surgical History:  Procedure Laterality Date  . APPENDECTOMY  09-12-2004   laparotomy w/ drainage peritinitis  . AV FISTULA PLACEMENT  02-27-2010   right forearm (RADIOCEPHALIC)  . AV FISTULA REPAIR  10-30-2010  . BONE BIOPSY Left 05/14/2020   Procedure: BONE BIOPSY;  Surgeon: Trula Slade, DPM;  Location: [place];  Service: Podiatry;  Laterality: Left;  . CARDIOVASCULAR STRESS TEST  10-29-2011   dr [name] [name]   Low risk scan;  mild perfusion defect seen in the basal inferoseptal, basal inferior and mid inferior regions consistent with an infarct/scar and/or overlying attenuation/  mild to moderate global LVSF,  ef 40-45%  . DOBUTAMINE STRESS ECHO  07-23-2012   [name]   abnormal ;  at rest estimated lvef 25-30% and global severe LV hypokinesis ;  no cp during stress and achieved 85% maxium predicted heart rate;  negative stress ECG for inducible ischemia;  estimated lvef with stress 35-40%;  augmentation of wall segments consistant with cardiomyopathy and differential fibrosis  . FISTULOTOMY N/A 03/26/2015   Procedure: FISTULOTOMY;  Surgeon: [name], MD;  Location: [facility];  Service: General;  Laterality: N/A;  . GRAFT APPLICATION  Left 05/14/2020   Procedure: GRAFT APPLICATION;  Surgeon: Trula Slade, DPM;  Location: [place];  Service: Podiatry;  Laterality: Left;  . INCISION AND DRAINAGE ABSCESS N/A 03/26/2015   Procedure: ANAL INCISION AND DRAINAGE;  Surgeon: [name], MD;  Location: [facility];  Service: General;  Laterality: N/A;  . RETINAL DETACHMENT SURGERY Left 2011   incomplete repair/ needs eye drops to keep pressure down  . TEE WITHOUT CARDIOVERSION N/A 10/17/2017   Procedure: TRANSESOPHAGEAL  ECHOCARDIOGRAM (TEE);  Surgeon: Josue Hector, MD;  Location: [facility] ENDOSCOPY;  Service: Cardiovascular;  Laterality: N/A;  . TRANSTHORACIC ECHOCARDIOGRAM  07-01-2014    done at [facility]   grade 1 diastolic dysfunction,  ef 45%/  trace TR and PR  . WOUND DEBRIDEMENT Left 05/14/2020   Procedure: DEBRIDEMENT WOUND;  Surgeon: Trula Slade, DPM;  Location: [place];  Service: Podiatry;  Laterality: Left;    Current Medications: Current Meds  Medication Sig  . aspirin 325 MG tablet Take 325 mg by mouth every evening.  [name] atorvastatin (LIPITOR) 10 MG tablet Take 10 mg by mouth every evening.  . calcium acetate (PHOSLO) 667 MG capsule Take 1,334 mg by mouth See admin instructions. Take 2 capsules (1334 mg) by mouth twice daily - with lunch and supper  . ceFEPIme 2 g in sodium chloride 0.9 % 100 mL Inject 2 g into the vein every Tuesday, Thursday, and Saturday at 6 PM for 17 doses.  . celecoxib (CELEBREX) 100 MG capsule Take 100 mg by mouth daily as needed (pain).  . Continuous Blood Gluc Sensor (FREESTYLE LIBRE 14 DAY SENSOR) MISC See admin instructions.  . cycloSPORINE, PF, (CEQUA) 0.09 % SOLN Place 1 drop into both eyes 2 (two) times daily.  . febuxostat (ULORIC) 40 MG tablet Take 40 mg by mouth every evening.  . ferric citrate (AURYXIA) 1 GM 210 MG(Fe) tablet Take 420 mg by mouth See admin instructions. Take 2 tablets (420 mg) by mouth twice daily - with lunch and supper  . gabapentin (NEURONTIN) 300 MG capsule Take 300 mg by mouth 3 (three) times daily.  [name] ibuprofen (ADVIL) 200 MG tablet Take 600 mg by mouth 3 (three) times daily as needed (pain).  . Insulin Disposable Pump (OMNIPOD DASH 5 PACK PODS) MISC Inject into the skin. Use with Humalog  . lidocaine (XYLOCAINE) 5 % ointment Apply 1 application topically 2 (two) times daily as needed (finger pain).  . metroNIDAZOLE (FLAGYL) 500 MG tablet Take 1 tablet (500 mg total) by mouth every 8 (eight) hours.  . midodrine (PROAMATINE) 5  MG tablet Take 5 mg by mouth See admin instructions. Take one tablet (5 mg) by mouth Tuesday, Thursday, Saturday before dialysis  . Multiple Vitamin (MULTIVITAMIN WITH MINERALS) TABS tablet Take 1 tablet by mouth every evening.  . mupirocin ointment (BACTROBAN) 2 % APPLY TO AFFECTED AREAS OF BOTH FEET ONCE DAILY.  [name] ondansetron (ZOFRAN) 4 MG tablet Take 4 mg by mouth 3 (three) times daily as needed for nausea or vomiting.  . Oxycodone HCl 10 MG TABS Take 10 mg by mouth 3 (three) times daily as needed (severe pain).  . sucroferric oxyhydroxide (VELPHORO) 500 MG chewable tablet Chew 500 mg by mouth See admin instructions. Chew one tablet (500 mg) by mouth twice daily - with lunch and supper  . triamcinolone cream (KENALOG) 0.1 % Apply 1 application topically daily as needed (itching).  . vancomycin (VANCOCIN) 1-5 GM/200ML-% SOLN Inject 200 mLs (1,000 mg  total) into the vein Every Tuesday,Thursday,and Saturday with dialysis.     Allergies:   Patient has no known allergies.   Social History   Socioeconomic History  . Marital status: Single    Spouse name: Not on file  . Number of children: Not on file  . Years of education: Not on file  . Highest education level: Not on file  Occupational History  . Occupation: Retired  Tobacco Use  . Smoking status: Never Smoker  . Smokeless tobacco: Never Used  Vaping Use  . Vaping Use: Never used  Substance and Sexual Activity  . Alcohol use: No  . Drug use: No  . Sexual activity: Not Currently  Other Topics Concern  . Not on file  Social History Narrative  . Not on file   Social Determinants of Health   Financial Resource Strain: Not on file  Food Insecurity: Not on file  Transportation Needs: Not on file  Physical Activity: Not on file  Stress: Not on file  Social Connections: Not on file     Family History: The patient's family history includes Diabetes in an other family member.  ROS:   Please see the history of present illness.     He is frightened.  Breathing is bad at times.  Blood pressure is not obtainable.  No chest pain.  All other systems reviewed and are negative.  EKGs/Labs/Other Studies Reviewed:    The following studies were reviewed today:  VASCULAR ARTERIAL DOPPLER 05/13/2020 Right TBIs appear decreased. Left TBIs appear decreased.    Summary:  Right: The right toe-brachial index is abnormal.   Left: The left toe-brachial index is normal.   ECHOCARDIOGRAM 2/ 2021 IMPRESSIONS    1. Left ventricular ejection fraction, by visual estimation, is 40 to  45%. The left ventricle has mildly decreased function. There is mildly  increased left ventricular hypertrophy.  Unchanged from prior echo EF reports. 2. Elevated left atrial and left ventricular end-diastolic pressures.  3. Left ventricular diastolic parameters are consistent with Grade II  diastolic dysfunction (pseudonormalization).  4. The left ventricle demonstrates global hypokinesis.  5. Global right ventricle was not well visualized.The right ventricular  size is not well visualized. Right vetricular wall thickness was not  assessed.  6. Left atrial size was normal.  7. Right atrial size was normal.  8. Mild mitral annular calcification.  9. The mitral valve is abnormal. Trivial mitral valve regurgitation.  10. The tricuspid valve is grossly normal.  11. The tricuspid valve is grossly normal. Tricuspid valve regurgitation  is mild.  12. Aortic valve mean gradient measures 9.0 mmHg.  13. Aortic valve peak gradient measures 17.3 mmHg.  14. The aortic valve is tricuspid. Aortic valve regurgitation is not  visualized. Mild aortic valve sclerosis without stenosis.  15. The pulmonic valve was not well visualized. Pulmonic valve  regurgitation is not visualized.  16. The interatrial septum was not well visualized.    EKG:  EKG performed on 13 May 2020 demonstrates sinus rhythm with marked right axis deviation, left posterior  hemiblock, and prominent voltage.  Recent Labs: 05/13/2020: ALT 13; Magnesium 2.3; TSH 0.953 05/15/2020: BUN 31; Creatinine, Ser 7.81; Hemoglobin 10.9; Platelets 360; Potassium 4.3; Sodium 136  Recent Lipid Panel No results found for: CHOL, TRIG, HDL, CHOLHDL, VLDL, LDLCALC, LDLDIRECT  Physical Exam:    VS:  Ht '5\' 5"'$  (1.651 m)   Wt 241 lb 6.4 oz (109.5 kg)   BMI 40.17 kg/m     Wt Readings  from Last 3 Encounters:  05/26/20 241 lb 6.4 oz (109.5 kg)  05/15/20 229 lb 4.5 oz (104 kg)  01/10/20 239 lb 3.2 oz (108.5 kg)     GEN: Morbid obesity. No acute distress HEENT: Normal NECK: No JVD. LYMPHATICS: No lymphadenopathy CARDIAC: No murmur. RRR no gallop, with marked bilateral thigh to ankle 3+ edema. VASCULAR:  Normal Pulses. No bruits. RESPIRATORY:  Clear to auscultation without rales, wheezing or rhonchi  ABDOMEN: Soft, non-tender, non-distended, No pulsatile mass, MUSCULOSKELETAL: No deformity  SKIN: Warm and dry NEUROLOGIC:  Alert and oriented x 3 PSYCHIATRIC:  Normal affect   ASSESSMENT:    1. Chronic systolic heart failure ([place])   2. ESRD (end stage renal disease) on dialysis ([place])   3. Essential hypertension   4. Uncontrolled type 2 diabetes mellitus with hyperglycemia ([place])   5. Bilateral lower extremity edema   6. Educated about COVID-19 virus infection   7. PVD (peripheral vascular disease) ([place])    PLAN:    In order of problems listed above:  1. Basically has chronic combined systolic and diastolic heart failure. 2. Continue dialysis.  Difficult to get to dry weight due to peripheral arterial disease and difficulty obtaining central pressures to gauge management. 3. Unable to record a blood pressure today. 4. Did not discuss 5. Volume overload related to kidney failure and low blood pressure preventing dry weight. 6. Did not discuss 7. Left foot deep infection without the need for amputation.  Hopefully infection will clear.  Discussed with the patient and  his mother that swelling and dyspnea are related to volume overload in the setting of end-stage kidney disease and chronic hemodialysis treatment.  Dry weight is not been achievable due to low blood pressures.  Low blood pressures are related both to obstructive arterial disease and prevents adequate measurement and autonomic dysfunction from diabetes.  Overall prognosis is guarded.  Plan 1 year follow-up.  We will be happy to see more acutely if chest pain or arrhythmia.  We have no control over heart failure management as kidneys do not work and diuresis is not possible.  Low blood pressures also make heart failure medical management and possible.   Medication Adjustments/Labs and Tests Ordered: Current medicines are reviewed at length with the patient today.  Concerns regarding medicines are outlined above.  No orders of the defined types were placed in this encounter.  No orders of the defined types were placed in this encounter.   Patient Instructions  Medication Instructions:  Your physician recommends that you continue on your current medications as directed. Please refer to the Current Medication list given to you today.  *If you need a refill on your cardiac medications before your next appointment, please call your pharmacy*   Lab Work: None If you have labs (blood work) drawn today and your tests are completely normal, you will receive your results only by: [name] MyChart Message (if you have MyChart) OR . A paper copy in the mail If you have any lab test that is abnormal or we need to change your treatment, we will call you to review the results.   Testing/Procedures: None   Follow-Up: At [facility], you and your health needs are our priority.  As part of our continuing mission to provide you with exceptional heart care, we have created designated Provider Care Teams.  These Care Teams include your primary Cardiologist (physician) and Advanced Practice Providers (APPs -   Physician Assistants and Nurse Practitioners) who all work together  to provide you with the care you need, when you need it.  We recommend signing up for the patient portal called "MyChart".  Sign up information is provided on this After Visit Summary.  MyChart is used to connect with patients for Virtual Visits (Telemedicine).  Patients are able to view lab/test results, encounter notes, upcoming appointments, etc.  Non-urgent messages can be sent to your provider as well.   To learn more about what you can do with MyChart, go to [email or web address].    Your next appointment:   1 year(s)  The format for your next appointment:   In Person  Provider:   You may see [name] Grooms, MD or one of the following Advanced Practice Providers on your designated Care Team:    Kathyrn Drown, NP    Other Instructions      Signed, [name] Grooms, MD  05/26/2020 10:27 AM    [place]

## 2020-05-26 ENCOUNTER — Ambulatory Visit (INDEPENDENT_AMBULATORY_CARE_PROVIDER_SITE_OTHER): Payer: Medicare Other | Admitting: Podiatry

## 2020-05-26 ENCOUNTER — Other Ambulatory Visit: Payer: Self-pay

## 2020-05-26 ENCOUNTER — Encounter: Payer: Self-pay | Admitting: Interventional Cardiology

## 2020-05-26 ENCOUNTER — Ambulatory Visit (INDEPENDENT_AMBULATORY_CARE_PROVIDER_SITE_OTHER): Payer: Medicare Other | Admitting: Interventional Cardiology

## 2020-05-26 VITALS — Ht 65.0 in | Wt 241.4 lb

## 2020-05-26 DIAGNOSIS — Z992 Dependence on renal dialysis: Secondary | ICD-10-CM | POA: Diagnosis not present

## 2020-05-26 DIAGNOSIS — R600 Localized edema: Secondary | ICD-10-CM | POA: Diagnosis not present

## 2020-05-26 DIAGNOSIS — E1165 Type 2 diabetes mellitus with hyperglycemia: Secondary | ICD-10-CM | POA: Diagnosis not present

## 2020-05-26 DIAGNOSIS — L97524 Non-pressure chronic ulcer of other part of left foot with necrosis of bone: Secondary | ICD-10-CM

## 2020-05-26 DIAGNOSIS — M869 Osteomyelitis, unspecified: Secondary | ICD-10-CM | POA: Diagnosis not present

## 2020-05-26 DIAGNOSIS — Z7189 Other specified counseling: Secondary | ICD-10-CM

## 2020-05-26 DIAGNOSIS — I739 Peripheral vascular disease, unspecified: Secondary | ICD-10-CM

## 2020-05-26 DIAGNOSIS — E11621 Type 2 diabetes mellitus with foot ulcer: Secondary | ICD-10-CM | POA: Diagnosis not present

## 2020-05-26 DIAGNOSIS — N186 End stage renal disease: Secondary | ICD-10-CM

## 2020-05-26 DIAGNOSIS — L97419 Non-pressure chronic ulcer of right heel and midfoot with unspecified severity: Secondary | ICD-10-CM | POA: Diagnosis not present

## 2020-05-26 DIAGNOSIS — I10 Essential (primary) hypertension: Secondary | ICD-10-CM

## 2020-05-26 DIAGNOSIS — I5022 Chronic systolic (congestive) heart failure: Secondary | ICD-10-CM

## 2020-05-26 DIAGNOSIS — E1169 Type 2 diabetes mellitus with other specified complication: Secondary | ICD-10-CM | POA: Diagnosis not present

## 2020-05-26 DIAGNOSIS — Z4781 Encounter for orthopedic aftercare following surgical amputation: Secondary | ICD-10-CM | POA: Diagnosis not present

## 2020-05-26 DIAGNOSIS — L97429 Non-pressure chronic ulcer of left heel and midfoot with unspecified severity: Secondary | ICD-10-CM | POA: Diagnosis not present

## 2020-05-26 DIAGNOSIS — L97411 Non-pressure chronic ulcer of right heel and midfoot limited to breakdown of skin: Secondary | ICD-10-CM

## 2020-05-26 DIAGNOSIS — E08621 Diabetes mellitus due to underlying condition with foot ulcer: Secondary | ICD-10-CM

## 2020-05-26 NOTE — Patient Instructions (Signed)
Medication Instructions:  Your physician recommends that you continue on your current medications as directed. Please refer to the Current Medication list given to you today.  *If you need a refill on your cardiac medications before your next appointment, please call your pharmacy*   Lab Work: None If you have labs (blood work) drawn today and your tests are completely normal, you will receive your results only by: . MyChart Message (if you have MyChart) OR . A paper copy in the mail If you have any lab test that is abnormal or we need to change your treatment, we will call you to review the results.   Testing/Procedures: None   Follow-Up: At [facility], you and your health needs are our priority.  As part of our continuing mission to provide you with exceptional heart care, we have created designated Provider Care Teams.  These Care Teams include your primary Cardiologist (physician) and Advanced Practice Providers (APPs -  Physician Assistants and Nurse Practitioners) who all work together to provide you with the care you need, when you need it.  We recommend signing up for the patient portal called "MyChart".  Sign up information is provided on this After Visit Summary.  MyChart is used to connect with patients for Virtual Visits (Telemedicine).  Patients are able to view lab/test results, encounter notes, upcoming appointments, etc.  Non-urgent messages can be sent to your provider as well.   To learn more about what you can do with MyChart, go to [email or web address].    Your next appointment:   1 year(s)  The format for your next appointment:   In Person  Provider:   You may see Henry W Smith III, MD or one of the following Advanced Practice Providers on your designated Care Team:    Jill McDaniel, NP    Other Instructions   

## 2020-05-27 DIAGNOSIS — N2581 Secondary hyperparathyroidism of renal origin: Secondary | ICD-10-CM | POA: Diagnosis not present

## 2020-05-27 DIAGNOSIS — N186 End stage renal disease: Secondary | ICD-10-CM | POA: Diagnosis not present

## 2020-05-27 DIAGNOSIS — U071 COVID-19: Secondary | ICD-10-CM | POA: Diagnosis not present

## 2020-05-27 DIAGNOSIS — M86172 Other acute osteomyelitis, left ankle and foot: Secondary | ICD-10-CM | POA: Diagnosis not present

## 2020-05-27 DIAGNOSIS — D631 Anemia in chronic kidney disease: Secondary | ICD-10-CM | POA: Diagnosis not present

## 2020-05-28 ENCOUNTER — Other Ambulatory Visit: Payer: Self-pay

## 2020-05-28 ENCOUNTER — Telehealth: Payer: Self-pay | Admitting: Podiatry

## 2020-05-28 ENCOUNTER — Encounter (INDEPENDENT_AMBULATORY_CARE_PROVIDER_SITE_OTHER): Payer: Self-pay | Admitting: Otolaryngology

## 2020-05-28 ENCOUNTER — Ambulatory Visit (INDEPENDENT_AMBULATORY_CARE_PROVIDER_SITE_OTHER): Payer: Medicare Other | Admitting: Otolaryngology

## 2020-05-28 VITALS — Temp 97.5°F

## 2020-05-28 DIAGNOSIS — L97419 Non-pressure chronic ulcer of right heel and midfoot with unspecified severity: Secondary | ICD-10-CM | POA: Diagnosis not present

## 2020-05-28 DIAGNOSIS — L97429 Non-pressure chronic ulcer of left heel and midfoot with unspecified severity: Secondary | ICD-10-CM | POA: Diagnosis not present

## 2020-05-28 DIAGNOSIS — M869 Osteomyelitis, unspecified: Secondary | ICD-10-CM | POA: Diagnosis not present

## 2020-05-28 DIAGNOSIS — H6121 Impacted cerumen, right ear: Secondary | ICD-10-CM

## 2020-05-28 DIAGNOSIS — E1169 Type 2 diabetes mellitus with other specified complication: Secondary | ICD-10-CM | POA: Diagnosis not present

## 2020-05-28 DIAGNOSIS — E11621 Type 2 diabetes mellitus with foot ulcer: Secondary | ICD-10-CM | POA: Diagnosis not present

## 2020-05-28 DIAGNOSIS — Z4781 Encounter for orthopedic aftercare following surgical amputation: Secondary | ICD-10-CM | POA: Diagnosis not present

## 2020-05-28 NOTE — Progress Notes (Signed)
HPI: [name] [name] is a 55 y.o. male who presents for evaluation of blockage of his right ear and tinnitus in the right ear that is doing much better presently.  Past Medical History:  Diagnosis Date  . Anal infection    posterior anal canal  . Anemia, chronic renal failure   . Atrophic kidney    BILATERAL  . DM type 2 causing ESRD [facility])    Nephrologist-- dr [name] [facility])--  on hemodialysis since June 2012 at  [name]  TTS  . Hemodialysis patient [facility])    at [place] on Tues/ Thur/Sat/schedule  . Hemorrhoids   . Hepatitis B antibody positive   . History of pleural effusion    bilateral  . Hyperparathyroidism, secondary renal ([place])   . Hypertension   . Ischemic cardiomyopathy    per echo 07-01-2014  ef 45%  . LAFB (left anterior fascicular block)   . Peripheral neuropathy   . Systolic and diastolic CHF, chronic ([place])    CARDIOLOGIST-  DR Daneen Schick ([place])  AND DR [name] ([name])   Past Surgical History:  Procedure Laterality Date  . APPENDECTOMY  09-12-2004   laparotomy w/ drainage peritinitis  . AV FISTULA PLACEMENT  02-27-2010   right forearm (RADIOCEPHALIC)  . AV FISTULA REPAIR  10-30-2010  . BONE BIOPSY Left 05/14/2020   Procedure: BONE BIOPSY;  Surgeon: Trula Slade, DPM;  Location: [place];  Service: Podiatry;  Laterality: Left;  . CARDIOVASCULAR STRESS TEST  10-29-2011   dr Daneen Schick   Low risk scan;  mild perfusion defect seen in the basal inferoseptal, basal inferior and mid inferior regions consistent with an infarct/scar and/or overlying attenuation/  mild to moderate global LVSF,  ef 40-45%  . DOBUTAMINE STRESS ECHO  07-23-2012   [name]   abnormal ;  at rest estimated lvef 25-30% and global severe LV hypokinesis ;  no cp during stress and achieved 85% maxium predicted heart rate;  negative stress ECG for inducible ischemia;  estimated lvef with stress 35-40%;  augmentation of wall  segments consistant with cardiomyopathy and differential fibrosis  . FISTULOTOMY N/A 03/26/2015   Procedure: FISTULOTOMY;  Surgeon: [name], MD;  Location: [facility];  Service: General;  Laterality: N/A;  . GRAFT APPLICATION Left 0000000   Procedure: GRAFT APPLICATION;  Surgeon: Trula Slade, DPM;  Location: [place];  Service: Podiatry;  Laterality: Left;  . INCISION AND DRAINAGE ABSCESS N/A 03/26/2015   Procedure: ANAL INCISION AND DRAINAGE;  Surgeon: [name], MD;  Location: [facility];  Service: General;  Laterality: N/A;  . RETINAL DETACHMENT SURGERY Left 2011   incomplete repair/ needs eye drops to keep pressure down  . TEE WITHOUT CARDIOVERSION N/A 10/17/2017   Procedure: TRANSESOPHAGEAL ECHOCARDIOGRAM (TEE);  Surgeon: Josue Hector, MD;  Location: [facility] ENDOSCOPY;  Service: Cardiovascular;  Laterality: N/A;  . TRANSTHORACIC ECHOCARDIOGRAM  07-01-2014    done at [facility]   grade 1 diastolic dysfunction,  ef 45%/  trace TR and PR  . WOUND DEBRIDEMENT Left 05/14/2020   Procedure: DEBRIDEMENT WOUND;  Surgeon: Trula Slade, DPM;  Location: [place];  Service: Podiatry;  Laterality: Left;   Social History   Socioeconomic History  . Marital status: Single    Spouse name: Not on file  . Number of children: Not on file  . Years of education: Not on file  . Highest education level: Not on  file  Occupational History  . Occupation: Retired  Tobacco Use  . Smoking status: Never Smoker  . Smokeless tobacco: Never Used  Vaping Use  . Vaping Use: Never used  Substance and Sexual Activity  . Alcohol use: No  . Drug use: No  . Sexual activity: Not Currently  Other Topics Concern  . Not on file  Social History Narrative  . Not on file   Social Determinants of Health   Financial Resource Strain: Not on file  Food Insecurity: Not on file  Transportation Needs: Not on file  Physical Activity: Not on file  Stress: Not on file   Social Connections: Not on file   Family History  Problem Relation Age of Onset  . Diabetes Other    No Known Allergies Prior to Admission medications   Medication Sig Start Date End Date Taking? Authorizing Provider  aspirin 325 MG tablet Take 325 mg by mouth every evening.    [provider]  atorvastatin (LIPITOR) 10 MG tablet Take 10 mg by mouth every evening.    [provider]  calcium acetate (PHOSLO) 667 MG capsule Take 1,334 mg by mouth See admin instructions. Take 2 capsules (1334 mg) by mouth twice daily - with lunch and supper 06/02/10   [provider]  ceFEPIme 2 g in sodium chloride 0.9 % 100 mL Inject 2 g into the vein every Tuesday, Thursday, and Saturday at 6 PM for 17 doses. 05/17/20 06/25/20  Barb Merino, MD  celecoxib (CELEBREX) 100 MG capsule Take 100 mg by mouth daily as needed (pain). 04/25/19   [provider]  Continuous Blood Gluc Sensor (FREESTYLE LIBRE 14 DAY SENSOR) MISC See admin instructions. 04/08/20   [provider]  cycloSPORINE, PF, (CEQUA) 0.09 % SOLN Place 1 drop into both eyes 2 (two) times daily.    [provider]  febuxostat (ULORIC) 40 MG tablet Take 40 mg by mouth every evening.    [provider]  ferric citrate (AURYXIA) 1 GM 210 MG(Fe) tablet Take 420 mg by mouth See admin instructions. Take 2 tablets (420 mg) by mouth twice daily - with lunch and supper    [provider]  gabapentin (NEURONTIN) 300 MG capsule Take 300 mg by mouth 3 (three) times daily. 05/09/15   [provider]  ibuprofen (ADVIL) 200 MG tablet Take 600 mg by mouth 3 (three) times daily as needed (pain).    [provider]  Insulin Disposable Pump (OMNIPOD DASH 5 PACK PODS) MISC Inject into the skin. Use with Humalog 08/28/19   [provider]  lidocaine (XYLOCAINE) 5 % ointment Apply 1 application topically 2 (two) times daily as needed (finger pain). 07/14/19   [provider]   metroNIDAZOLE (FLAGYL) 500 MG tablet Take 1 tablet (500 mg total) by mouth every 8 (eight) hours. 05/16/20 06/26/20  Barb Merino, MD  midodrine (PROAMATINE) 5 MG tablet Take 5 mg by mouth See admin instructions. Take one tablet (5 mg) by mouth Tuesday, Thursday, Saturday before dialysis    [provider]  Multiple Vitamin (MULTIVITAMIN WITH MINERALS) TABS tablet Take 1 tablet by mouth every evening.    [provider]  mupirocin ointment (BACTROBAN) 2 % APPLY TO AFFECTED AREAS OF BOTH FEET ONCE DAILY. 04/22/20   McDonald, Stephan Minister, DPM  ondansetron (ZOFRAN) 4 MG tablet Take 4 mg by mouth 3 (three) times daily as needed for nausea or vomiting. 06/27/19   [provider]  Oxycodone HCl 10 MG  TABS Take 10 mg by mouth 3 (three) times daily as needed (severe pain). 09/28/19   [provider]  sucroferric oxyhydroxide (VELPHORO) 500 MG chewable tablet Chew 500 mg by mouth See admin instructions. Chew one tablet (500 mg) by mouth twice daily - with lunch and supper    [provider]  triamcinolone cream (KENALOG) 0.1 % Apply 1 application topically daily as needed (itching). 07/14/19   [provider]  vancomycin (VANCOCIN) 1-5 GM/200ML-% SOLN Inject 200 mLs (1,000 mg total) into the vein Every Tuesday,Thursday,and Saturday with dialysis. 05/17/20 06/25/20  Barb Merino, MD     Positive ROS: Otherwise negative  All other systems have been reviewed and were otherwise negative with the exception of those mentioned in the HPI and as above.  Physical Exam: Constitutional: Alert, well-appearing, no acute distress Ears: External ears without lesions or tenderness.  Left ear canal had minimal wax buildup.  Right ear canal was completely occluded with cerumen that was removed with forceps and curettes.  TMs were clear bilaterally.  Hearing screening with a 1024 tuning fork revealed symmetric hearing with good hearing in both ears. Nasal: External nose without  lesions.. Clear nasal passages Oral: Lips and gums without lesions. Tongue and palate mucosa without lesions. Posterior oropharynx clear. Neck: No palpable adenopathy or masses Respiratory: Breathing comfortably  Skin: No facial/neck lesions or rash noted.  Cerumen impaction removal  Date/Time: 05/28/2020 10:00 AM Performed by: Rozetta Nunnery, MD Authorized by: Rozetta Nunnery, MD   Consent:    Consent obtained:  Verbal   Consent given by:  Patient   Risks discussed:  Pain and bleeding Procedure details:    Location:  L ear and R ear   Procedure type: curette and forceps   Post-procedure details:    Inspection:  TM intact and canal normal   Hearing quality:  Improved   Patient tolerance of procedure:  Tolerated well, no immediate complications Comments:     Right ear canal was completely occluded with cerumen that was removed with forceps.  TMs are clear bilaterally.    Assessment: Right ear cerumen impaction.  Plan: This was cleaned in the office. He will follow-up as needed.  Radene Journey, MD

## 2020-05-28 NOTE — Telephone Encounter (Signed)
[facility] called in regarding order for bilateral foot care, diabetic ulcer wound is on right foot, surgical area is left.  The current order stated don't touch left foot. The nurse stated she changed dressing on right foot but to continue she will need an order for that only because patients mom stated she was to change both. The nurse stated she doesn't have problem but just need an order. Stated one time change for evaluation, would like to continue    [place] ([place] Nurse

## 2020-05-28 NOTE — Telephone Encounter (Signed)
I called her and gave verbal orders for dressing change. She is to clean the wound with saline, adaptic followed by Surgilube and cover with 4x4, kerlix, ACE.   Nira Conn- she was asking if we could send him home with some adaptic and jelly until they get it?

## 2020-05-28 NOTE — Progress Notes (Signed)
Subjective: [name] [name] is a 55 y.o. is seen today in office s/p left heel ulcer debridement preformed on 05/14/2020.  He presents here for dressing change.  States he has been doing well without any new concerns.  He is on antibiotics orally, Flagyl as well as while at dialysis. Denies any systemic complaints such as fevers, chills, nausea, vomiting. No calf pain, chest pain, shortness of breath.   Objective: General: No acute distress, AAOx3  DP/PT pulses palpable 2/4, CRT < 3 sec to all digits.  LEFT foot: Graft is intact to the plantar aspect of the heel without any surrounding erythema, ascending cellulitis there is no drainage or pus or fluctuation crepitation but there is no malodor.   RIGHT foot: Small eschar on the plantar aspect of the heel without any drainage or pus.No surrounding erythema, ascending cellulitis.  No fluctuation crepitation there is malodor.   No other areas of tenderness to bilateral lower extremities.  No other open lesions or pre-ulcerative lesions.  No pain with calf compression, swelling, warmth, erythema.   Assessment and Plan:  Status post left heel ulcer debridement, right heel wound  -Treatment options discussed including all alternatives, risks, and complications -Dressing was changed on the left side.  Surgical wound was applied followed by Adaptic and a dry sterile dressing.  The dressing intact, plan on changing this on Thursday. -Continue Betadine wet-to-dry on the right side.  Offloading pad dispensed. -Ice/elevation -Pain medication as needed. -Monitor for any clinical signs or symptoms of infection and DVT/PE and directed to call the office immediately should any occur or go to the ER. -Follow-up as scheduled for dressing change or sooner if any problems arise. In the meantime, encouraged to call the office with any questions, concerns, change in symptoms.   Celesta Gentile, DPM

## 2020-05-28 NOTE — Progress Notes (Signed)
Subjective: [name] [name] is a 55 y.o. is seen today in office s/p left heel ulcer debridement preformed on 05/14/2020.  Home health is not yet come out to change the bandage.  He says it contacted him but they have not come back.  He presents today for dressing change and otherwise states he is doing well.  Get some pain in the heel at times but not any increase.  Still the antibiotics as scheduled per infectious disease. Denies any systemic complaints such as fevers, chills, nausea, vomiting. No calf pain, chest pain, shortness of breath.   Objective: General: No acute distress, AAOx3  DP/PT pulses palpable 2/4, CRT < 3 sec to all digits.  LEFT foot: Graft is intact to the plantar aspect of the heel without any surrounding erythema, ascending cellulitis there is no drainage or pus or fluctuation crepitation but there is no malodor.  Staples are intact.  Upon removal the wound base appears to be fibropurulent but is more superficial normals even with the skin. RIGHT foot: Small eschar on the plantar aspect of the heel without any drainage or pus.No surrounding erythema, ascending cellulitis.  No fluctuation crepitation there is malodor.   No other areas of tenderness to bilateral lower extremities.  Appears to be stable. No other open lesions or pre-ulcerative lesions.  No pain with calf compression, swelling, warmth, erythema.   Assessment and Plan:  Status post left heel ulcer debridement, right heel wound  -Treatment options discussed including all alternatives, risks, and complications -Staples removed with the graft on the left side.  Dressing was changed on the left side.  Surgical wound was applied followed by Adaptic and a dry sterile dressing.  The dressing intact, plan on changing this on Thursday. -Continue Betadine wet-to-dry on the right side.  Offloading pad dispensed. -Discussed return to the operating room for the left heel to further debride as well as second application of  synthetic graft.  We will likely try to do this next week pending the schedule.  Trula Slade DPM

## 2020-05-29 ENCOUNTER — Other Ambulatory Visit: Payer: Self-pay

## 2020-05-29 ENCOUNTER — Ambulatory Visit (INDEPENDENT_AMBULATORY_CARE_PROVIDER_SITE_OTHER): Payer: Medicare Other

## 2020-05-29 DIAGNOSIS — M86172 Other acute osteomyelitis, left ankle and foot: Secondary | ICD-10-CM | POA: Diagnosis not present

## 2020-05-29 DIAGNOSIS — E1165 Type 2 diabetes mellitus with hyperglycemia: Secondary | ICD-10-CM

## 2020-05-29 DIAGNOSIS — U071 COVID-19: Secondary | ICD-10-CM | POA: Diagnosis not present

## 2020-05-29 DIAGNOSIS — N186 End stage renal disease: Secondary | ICD-10-CM | POA: Diagnosis not present

## 2020-05-29 DIAGNOSIS — D631 Anemia in chronic kidney disease: Secondary | ICD-10-CM | POA: Diagnosis not present

## 2020-05-29 DIAGNOSIS — N2581 Secondary hyperparathyroidism of renal origin: Secondary | ICD-10-CM | POA: Diagnosis not present

## 2020-05-29 NOTE — Progress Notes (Signed)
Patient present today for a dressing change to left heel. A dry sterile dressing was applied without complication. Patient denies nausea, vomiting, fever, or chills at this time. Patient stated that the home health nurse came out yesterday and changed the dressing to the right foot. Patient advised to keep dressing clean, dry, and intact. Also to call the office with any questions or concerns. Patient verbalized understanding.

## 2020-05-30 DIAGNOSIS — L97429 Non-pressure chronic ulcer of left heel and midfoot with unspecified severity: Secondary | ICD-10-CM | POA: Diagnosis not present

## 2020-05-30 DIAGNOSIS — E11621 Type 2 diabetes mellitus with foot ulcer: Secondary | ICD-10-CM | POA: Diagnosis not present

## 2020-05-30 DIAGNOSIS — Z4781 Encounter for orthopedic aftercare following surgical amputation: Secondary | ICD-10-CM | POA: Diagnosis not present

## 2020-05-30 DIAGNOSIS — E1169 Type 2 diabetes mellitus with other specified complication: Secondary | ICD-10-CM | POA: Diagnosis not present

## 2020-05-30 DIAGNOSIS — M869 Osteomyelitis, unspecified: Secondary | ICD-10-CM | POA: Diagnosis not present

## 2020-05-30 DIAGNOSIS — L97419 Non-pressure chronic ulcer of right heel and midfoot with unspecified severity: Secondary | ICD-10-CM | POA: Diagnosis not present

## 2020-05-30 NOTE — Progress Notes (Signed)
Reviewing pt chart for pre-op interview his surgery on 06-04-2020 by Dr Jacqualyn Posey, case posted today.  Due to pt multiple medical complexity's had anesthesia review if pt candidate for [facility].  Per Dr Lissa Hoard MDA, stated due to pt complex medical history pt need to be done at [place].Hulen Skains and spoke w/ Chelle, OR scheduler for Dr Jacqualyn Posey, informed her that would need to done at main OR and why.

## 2020-05-31 DIAGNOSIS — U071 COVID-19: Secondary | ICD-10-CM | POA: Diagnosis not present

## 2020-05-31 DIAGNOSIS — D631 Anemia in chronic kidney disease: Secondary | ICD-10-CM | POA: Diagnosis not present

## 2020-05-31 DIAGNOSIS — M86172 Other acute osteomyelitis, left ankle and foot: Secondary | ICD-10-CM | POA: Diagnosis not present

## 2020-05-31 DIAGNOSIS — N2581 Secondary hyperparathyroidism of renal origin: Secondary | ICD-10-CM | POA: Diagnosis not present

## 2020-05-31 DIAGNOSIS — N186 End stage renal disease: Secondary | ICD-10-CM | POA: Diagnosis not present

## 2020-06-02 ENCOUNTER — Other Ambulatory Visit: Payer: Self-pay

## 2020-06-02 ENCOUNTER — Ambulatory Visit (INDEPENDENT_AMBULATORY_CARE_PROVIDER_SITE_OTHER): Payer: Medicare Other | Admitting: Podiatry

## 2020-06-02 ENCOUNTER — Encounter (HOSPITAL_COMMUNITY): Payer: Self-pay | Admitting: Podiatry

## 2020-06-02 DIAGNOSIS — L97429 Non-pressure chronic ulcer of left heel and midfoot with unspecified severity: Secondary | ICD-10-CM | POA: Diagnosis not present

## 2020-06-02 DIAGNOSIS — E1165 Type 2 diabetes mellitus with hyperglycemia: Secondary | ICD-10-CM

## 2020-06-02 DIAGNOSIS — M899 Disorder of bone, unspecified: Secondary | ICD-10-CM | POA: Diagnosis not present

## 2020-06-02 DIAGNOSIS — L97412 Non-pressure chronic ulcer of right heel and midfoot with fat layer exposed: Secondary | ICD-10-CM

## 2020-06-02 DIAGNOSIS — L97419 Non-pressure chronic ulcer of right heel and midfoot with unspecified severity: Secondary | ICD-10-CM | POA: Diagnosis not present

## 2020-06-02 DIAGNOSIS — M25569 Pain in unspecified knee: Secondary | ICD-10-CM | POA: Diagnosis not present

## 2020-06-02 DIAGNOSIS — E08621 Diabetes mellitus due to underlying condition with foot ulcer: Secondary | ICD-10-CM | POA: Diagnosis not present

## 2020-06-02 DIAGNOSIS — E1169 Type 2 diabetes mellitus with other specified complication: Secondary | ICD-10-CM | POA: Diagnosis not present

## 2020-06-02 DIAGNOSIS — I5030 Unspecified diastolic (congestive) heart failure: Secondary | ICD-10-CM | POA: Diagnosis not present

## 2020-06-02 DIAGNOSIS — R599 Enlarged lymph nodes, unspecified: Secondary | ICD-10-CM | POA: Diagnosis not present

## 2020-06-02 DIAGNOSIS — M79643 Pain in unspecified hand: Secondary | ICD-10-CM | POA: Diagnosis not present

## 2020-06-02 DIAGNOSIS — E1142 Type 2 diabetes mellitus with diabetic polyneuropathy: Secondary | ICD-10-CM | POA: Diagnosis not present

## 2020-06-02 DIAGNOSIS — L97524 Non-pressure chronic ulcer of other part of left foot with necrosis of bone: Secondary | ICD-10-CM | POA: Diagnosis not present

## 2020-06-02 DIAGNOSIS — Z4781 Encounter for orthopedic aftercare following surgical amputation: Secondary | ICD-10-CM | POA: Diagnosis not present

## 2020-06-02 DIAGNOSIS — Z79899 Other long term (current) drug therapy: Secondary | ICD-10-CM | POA: Diagnosis not present

## 2020-06-02 DIAGNOSIS — D638 Anemia in other chronic diseases classified elsewhere: Secondary | ICD-10-CM | POA: Diagnosis not present

## 2020-06-02 DIAGNOSIS — L97525 Non-pressure chronic ulcer of other part of left foot with muscle involvement without evidence of necrosis: Secondary | ICD-10-CM | POA: Diagnosis not present

## 2020-06-02 DIAGNOSIS — M869 Osteomyelitis, unspecified: Secondary | ICD-10-CM | POA: Diagnosis not present

## 2020-06-02 DIAGNOSIS — E11621 Type 2 diabetes mellitus with foot ulcer: Secondary | ICD-10-CM | POA: Diagnosis not present

## 2020-06-02 DIAGNOSIS — N186 End stage renal disease: Secondary | ICD-10-CM | POA: Diagnosis not present

## 2020-06-02 DIAGNOSIS — E7800 Pure hypercholesterolemia, unspecified: Secondary | ICD-10-CM | POA: Diagnosis not present

## 2020-06-02 NOTE — Progress Notes (Addendum)
COVID Vaccine Completed: x2 Date COVID Vaccine completed: 2nd dose 2-22 Has received booster: COVID vaccine manufacturer: [place]   Date of COVID positive in last 90 days: N/A  PCP - [name], MD Cardiologist - Daneen Schick, MD  Chest x-ray -  EKG - 05-13-20 Epic Stress Test - 2014 Epic ECHO - 04-24-19 Epic Cardiac Cath -  Pacemaker/ICD device last checked: Spinal Cord Stimulator:  Sleep Study - N/A CPAP -   Fasting Blood Sugar - 83 to 174 Checks Blood Sugar - 4 times a day.  Freestyle Libre L arm.  Pt has insulin pump (knows how to manage pump)  Blood Thinner Instructions: Aspirin Instructions:  ASA 325 mg.  Pt states that he was told to stay on it.  Last Dose:  Activity level:  Can go up a flight of stairs slowly due to foot pain.  Can perform activities of daily living without stopping and without symptoms of chest pain.  Patient has chronic shortness of breath.      Anesthesia review: On dialysis, CHF, cardiomyopathy, HTN, DM.  Chronic shortness of breath  Patient denies shortness of breath, fever, cough and chest pain at PAT appointment (completed over telphone).  Patient was alert and oriented and able to answer all questions.   Patient verbalized understanding of instructions that were given to them at the PAT appointment. Patient was also instructed that they will need to review over the PAT instructions again at home before surgery.

## 2020-06-02 NOTE — Progress Notes (Signed)
Please enter orders for surgery scheduled for 06/04/20.

## 2020-06-03 ENCOUNTER — Telehealth: Payer: Self-pay | Admitting: Specialist

## 2020-06-03 ENCOUNTER — Other Ambulatory Visit (HOSPITAL_COMMUNITY): Payer: Medicare Other

## 2020-06-03 ENCOUNTER — Telehealth: Payer: Self-pay | Admitting: Podiatry

## 2020-06-03 DIAGNOSIS — E1169 Type 2 diabetes mellitus with other specified complication: Secondary | ICD-10-CM | POA: Diagnosis not present

## 2020-06-03 DIAGNOSIS — Z4781 Encounter for orthopedic aftercare following surgical amputation: Secondary | ICD-10-CM | POA: Diagnosis not present

## 2020-06-03 DIAGNOSIS — N2581 Secondary hyperparathyroidism of renal origin: Secondary | ICD-10-CM | POA: Diagnosis not present

## 2020-06-03 DIAGNOSIS — E11621 Type 2 diabetes mellitus with foot ulcer: Secondary | ICD-10-CM | POA: Diagnosis not present

## 2020-06-03 DIAGNOSIS — N186 End stage renal disease: Secondary | ICD-10-CM | POA: Diagnosis not present

## 2020-06-03 DIAGNOSIS — M86172 Other acute osteomyelitis, left ankle and foot: Secondary | ICD-10-CM | POA: Diagnosis not present

## 2020-06-03 DIAGNOSIS — M869 Osteomyelitis, unspecified: Secondary | ICD-10-CM | POA: Diagnosis not present

## 2020-06-03 DIAGNOSIS — L97429 Non-pressure chronic ulcer of left heel and midfoot with unspecified severity: Secondary | ICD-10-CM | POA: Diagnosis not present

## 2020-06-03 DIAGNOSIS — D631 Anemia in chronic kidney disease: Secondary | ICD-10-CM | POA: Diagnosis not present

## 2020-06-03 DIAGNOSIS — U071 COVID-19: Secondary | ICD-10-CM | POA: Diagnosis not present

## 2020-06-03 DIAGNOSIS — L97419 Non-pressure chronic ulcer of right heel and midfoot with unspecified severity: Secondary | ICD-10-CM | POA: Diagnosis not present

## 2020-06-03 MED ORDER — OXYCODONE HCL 15 MG PO TABS: 15.0000 mg | ORAL_TABLET | Freq: Three times a day (TID) | ORAL | 0 refills | Status: DC | PRN

## 2020-06-03 NOTE — Telephone Encounter (Signed)
Cancelled Will discuss with patient tomorrow at surgery

## 2020-06-03 NOTE — Progress Notes (Signed)
Anesthesia Chart Review   Case: [id number] Date/Time: 06/04/20 1345   Procedures:      DEBRIDEMENT WOUND BOTH FEET (Bilateral )     GRAFT APPLICATION (Bilateral )   Anesthesia type: Monitor Anesthesia Care   Pre-op diagnosis: DIABETIC ULCER OF BOTH  FOOT   Location: [facility] ROOM 06 / [facility]   Surgeons: Trula Slade, DPM      DISCUSSION:55 y.o. never smoker with h/o HTN, DM II, ischemic cardiomyopathy, ESRD on hemodialysis TTS, diabetic ulcer of both feet scheduled for above procedure 06/07/2020 with Celesta Gentile, DPM.   Pt last seen by cardiology 05/26/2020. Swelling and dyspnea related to volume overload in the setting of ESRD. 1 year follow up recommended.  Per Dr. Mallie Mussel, "We will be happy to see more acutely if chest pain or arrhythmia.  We have no control over heart failure management as kidneys do not work and diuresis is not possible.  Low blood pressures also make heart failure medical management and possible."  Echo 04/24/19 with EF 40-45%.   S/p wound debridement 05/14/2020 with no anesthesia complications noted.   Anticipate pt can proceed with planned procedure barring acute status change and after evaluation by anesthesia DOS.  VS: Ht '5\' 5"'$  (1.651 m)   Wt 110.2 kg   BMI 40.44 kg/m   PROVIDERS: [name], MD is PCP  Daneen Schick, MD is Cardiologist  LABS: Labs reviewed: Acceptable for surgery. (all labs ordered are listed, but only abnormal results are displayed)  Labs Reviewed - No data to display   IMAGES:   EKG: 05/13/20 Rate 92 bpm  Sinus rhythm Probable right ventricular hypertrophy Borderline T abnormalities, inferior leads Borderline prolonged QT interval diffuse T wave flattening and inferior T wave inversion compared to previous  CV: Echo 04/24/2019 IMPRESSIONS    1. Left ventricular ejection fraction, by visual estimation, is 40 to  45%. The left ventricle has mildly decreased function. There is mildly  increased left ventricular  hypertrophy.  2. Elevated left atrial and left ventricular end-diastolic pressures.  3. Left ventricular diastolic parameters are consistent with Grade II  diastolic dysfunction (pseudonormalization).  4. The left ventricle demonstrates global hypokinesis.  5. Global right ventricle was not well visualized.The right ventricular  size is not well visualized. Right vetricular wall thickness was not  assessed.  6. Left atrial size was normal.  7. Right atrial size was normal.  8. Mild mitral annular calcification.  9. The mitral valve is abnormal. Trivial mitral valve regurgitation.  10. The tricuspid valve is grossly normal.  11. The tricuspid valve is grossly normal. Tricuspid valve regurgitation  is mild.  12. Aortic valve mean gradient measures 9.0 mmHg.  13. Aortic valve peak gradient measures 17.3 mmHg.  14. The aortic valve is tricuspid. Aortic valve regurgitation is not  visualized. Mild aortic valve sclerosis without stenosis.  15. The pulmonic valve was not well visualized. Pulmonic valve  regurgitation is not visualized.  16. The interatrial septum was not well visualized. Past Medical History:  Diagnosis Date  . Anal infection    posterior anal canal  . Anemia, chronic renal failure   . Atrophic kidney    BILATERAL  . DM type 2 causing ESRD [facility])    Nephrologist-- dr [name] [facility])--  on hemodialysis since June 2012 at  [name]  TTS  . Hemodialysis patient [facility])    at [place] on Tues/ Thur/Sat/schedule  . Hemorrhoids   . Hepatitis B antibody positive   .  History of pleural effusion    bilateral  . Hyperparathyroidism, secondary renal ([place])   . Hypertension   . Ischemic cardiomyopathy    per echo 07-01-2014  ef 45%  . LAFB (left anterior fascicular block)   . Peripheral neuropathy   . Systolic and diastolic CHF, chronic ([place])    CARDIOLOGIST-  DR Daneen Schick ([place])  AND DR [name]  ([name])    Past Surgical History:  Procedure Laterality Date  . APPENDECTOMY  09-12-2004   laparotomy w/ drainage peritinitis  . AV FISTULA PLACEMENT  02-27-2010   right forearm (RADIOCEPHALIC)  . AV FISTULA REPAIR  10-30-2010  . BONE BIOPSY Left 05/14/2020   Procedure: BONE BIOPSY;  Surgeon: Trula Slade, DPM;  Location: [place];  Service: Podiatry;  Laterality: Left;  . CARDIOVASCULAR STRESS TEST  10-29-2011   dr Daneen Schick   Low risk scan;  mild perfusion defect seen in the basal inferoseptal, basal inferior and mid inferior regions consistent with an infarct/scar and/or overlying attenuation/  mild to moderate global LVSF,  ef 40-45%  . DOBUTAMINE STRESS ECHO  07-23-2012   [name]   abnormal ;  at rest estimated lvef 25-30% and global severe LV hypokinesis ;  no cp during stress and achieved 85% maxium predicted heart rate;  negative stress ECG for inducible ischemia;  estimated lvef with stress 35-40%;  augmentation of wall segments consistant with cardiomyopathy and differential fibrosis  . FISTULOTOMY N/A 03/26/2015   Procedure: FISTULOTOMY;  Surgeon: [name], MD;  Location: [facility];  Service: General;  Laterality: N/A;  . GRAFT APPLICATION Left 0000000   Procedure: GRAFT APPLICATION;  Surgeon: Trula Slade, DPM;  Location: [place];  Service: Podiatry;  Laterality: Left;  . INCISION AND DRAINAGE ABSCESS N/A 03/26/2015   Procedure: ANAL INCISION AND DRAINAGE;  Surgeon: [name], MD;  Location: [facility];  Service: General;  Laterality: N/A;  . RETINAL DETACHMENT SURGERY Left 2011   incomplete repair/ needs eye drops to keep pressure down  . TEE WITHOUT CARDIOVERSION N/A 10/17/2017   Procedure: TRANSESOPHAGEAL ECHOCARDIOGRAM (TEE);  Surgeon: Josue Hector, MD;  Location: [facility] ENDOSCOPY;  Service: Cardiovascular;  Laterality: N/A;  . TRANSTHORACIC ECHOCARDIOGRAM  07-01-2014    done at [facility]   grade 1 diastolic  dysfunction,  ef 45%/  trace TR and PR  . WOUND DEBRIDEMENT Left 05/14/2020   Procedure: DEBRIDEMENT WOUND;  Surgeon: Trula Slade, DPM;  Location: [place];  Service: Podiatry;  Laterality: Left;    MEDICATIONS: No current facility-administered medications for this encounter.   [name] aspirin 325 MG tablet  . atorvastatin (LIPITOR) 10 MG tablet  . calcium acetate (PHOSLO) 667 MG capsule  . ceFEPIme 2 g in sodium chloride 0.9 % 100 mL  . celecoxib (CELEBREX) 100 MG capsule  . cycloSPORINE, PF, (CEQUA) 0.09 % SOLN  . febuxostat (ULORIC) 40 MG tablet  . ferric citrate (AURYXIA) 1 GM 210 MG(Fe) tablet  . gabapentin (NEURONTIN) 300 MG capsule  . ibuprofen (ADVIL) 200 MG tablet  . insulin lispro (HUMALOG) 100 UNIT/ML injection  . lidocaine (XYLOCAINE) 5 % ointment  . metroNIDAZOLE (FLAGYL) 500 MG tablet  . midodrine (PROAMATINE) 5 MG tablet  . Multiple Vitamin (MULTIVITAMIN WITH MINERALS) TABS tablet  . ondansetron (ZOFRAN) 4 MG tablet  . sucroferric oxyhydroxide (VELPHORO) 500 MG chewable tablet  . triamcinolone cream (KENALOG) 0.1 %  . vancomycin (VANCOCIN) 1-5 GM/200ML-% SOLN  . Continuous  Blood Gluc Sensor (FREESTYLE LIBRE 14 DAY SENSOR) MISC  . Insulin Disposable Pump (OMNIPOD DASH 5 PACK PODS) MISC  . mupirocin ointment (BACTROBAN) 2 %  . oxyCODONE (ROXICODONE) 15 MG immediate release tablet     Konrad Felix, PA-C [facility] Pre-Surgical Testing [phone]

## 2020-06-03 NOTE — Anesthesia Preprocedure Evaluation (Addendum)
Anesthesia Evaluation  Patient identified by MRN, date of birth, ID band Patient awake    Reviewed: Allergy & Precautions, NPO status , Patient's Chart, lab work & pertinent test results  Airway Mallampati: II  TM Distance: >3 FB Neck ROM: Full    Dental no notable dental hx.    Pulmonary neg pulmonary ROS,    Pulmonary exam normal breath sounds clear to auscultation       Cardiovascular hypertension, Pt. on medications + Peripheral Vascular Disease and +CHF  + dysrhythmias Atrial Fibrillation  Rhythm:Regular Rate:Normal     Neuro/Psych negative neurological ROS  negative psych ROS   GI/Hepatic negative GI ROS, Neg liver ROS,   Endo/Other  diabetes, Type 2Morbid obesity  Renal/GU DialysisRenal disease  negative genitourinary   Musculoskeletal negative musculoskeletal ROS (+)   Abdominal   Peds negative pediatric ROS (+)  Hematology  (+) Blood dyscrasia, anemia ,   Anesthesia Other Findings   Reproductive/Obstetrics negative OB ROS                            Anesthesia Physical Anesthesia Plan  ASA: IV  Anesthesia Plan: MAC   Post-op Pain Management:    Induction: Intravenous  PONV Risk Score and Plan: 1  Airway Management Planned: Mask, Natural Airway, Nasal Cannula and Simple Face Mask  Additional Equipment:   Intra-op Plan:   Post-operative Plan:   Informed Consent:   Plan Discussed with: Anesthesiologist  Anesthesia Plan Comments: (See PAT note 06/03/2020, Konrad Felix, PA-C    DISCUSSION:55 y.o. never smoker with h/o HTN, DM II, ischemic cardiomyopathy, ESRD on hemodialysis TTS, diabetic ulcer of both feet scheduled for above procedure 06/07/2020 with Celesta Gentile, DPM.   Pt last seen by cardiology 05/26/2020. Swelling and dyspnea related to volume overload in the setting of ESRD. 1 year follow up recommended.  Per Dr. Mallie Mussel, "We will be happy to see more  acutely if chest pain or arrhythmia. We have no control over heart failure management as kidneys do not work and diuresis is not possible. Low blood pressures also make heart failure medical management and possible."  Echo 04/24/19 with EF 40-45%.   S/p wound debridement 05/14/2020 with no anesthesia complications noted.   EKG: 05/13/20 Rate 92 bpm  Sinus rhythm Probable right ventricular hypertrophy Borderline T abnormalities, inferior leads Borderline prolonged QT interval diffuse T wave flattening and inferior T wave inversion compared to previous  CV: Echo 04/24/2019 1. Left ventricular ejection fraction, by visual estimation, is 40 to  45%. The left ventricle has mildly decreased function. There is mildly  increased left ventricular hypertrophy.  2. Elevated left atrial and left ventricular end-diastolic pressures.  3. Left ventricular diastolic parameters are consistent with Grade II  diastolic dysfunction (pseudonormalization).  4. The left ventricle demonstrates global hypokinesis.  5. Global right ventricle was not well visualized.The right ventricular  size is not well visualized. Right vetricular wall thickness was not  assessed.  6. Left atrial size was normal.  7. Right atrial size was normal.  8. Mild mitral annular calcification.  9. The mitral valve is abnormal. Trivial mitral valve regurgitation.  10. The tricuspid valve is grossly normal.  11. The tricuspid valve is grossly normal. Tricuspid valve regurgitation  is mild.  12. Aortic valve mean gradient measures 9.0 mmHg.  13. Aortic valve peak gradient measures 17.3 mmHg.  14. The aortic valve is tricuspid. Aortic valve regurgitation is not  visualized. Mild aortic  valve sclerosis without stenosis.  15. The pulmonic valve was not well visualized. Pulmonic valve  regurgitation is not visualized.  16. The interatrial septum was not well visualized.)       Anesthesia Quick Evaluation

## 2020-06-03 NOTE — Telephone Encounter (Signed)
Angel from [facility] called stating she needed clarification on the wound care orders and would like for you to give her a call directly. Please advise.

## 2020-06-03 NOTE — Telephone Encounter (Signed)
Pt's pharmacy called stating Dr. Radene Knee filled 150 tablets of oxycodone 10 mg on 05/23/20, and would like to know if you want to continue dispensing oxycodone 15 mg. Please advise.

## 2020-06-03 NOTE — Telephone Encounter (Signed)
I called and left a message for Glenard Haring- went straight to voicemail

## 2020-06-04 ENCOUNTER — Encounter (HOSPITAL_COMMUNITY): Payer: Self-pay | Admitting: Podiatry

## 2020-06-04 ENCOUNTER — Encounter: Payer: Self-pay | Admitting: Podiatry

## 2020-06-04 ENCOUNTER — Encounter (HOSPITAL_COMMUNITY)
Admission: RE | Disposition: A | Discharge status: Home / Self Care | Payer: Self-pay | Source: Home / Self Care | Attending: Podiatry

## 2020-06-04 ENCOUNTER — Ambulatory Visit (HOSPITAL_COMMUNITY): Payer: Medicare Other | Admitting: Anesthesiology

## 2020-06-04 ENCOUNTER — Ambulatory Visit (HOSPITAL_COMMUNITY)
Admission: RE | Admit: 2020-06-04 | Discharge: 2020-06-04 | Disposition: A | Discharge status: Home / Self Care | Payer: Medicare Other | Attending: Podiatry | Admitting: Podiatry

## 2020-06-04 ENCOUNTER — Inpatient Hospital Stay: Payer: Medicare Other | Admitting: Infectious Diseases

## 2020-06-04 DIAGNOSIS — I5030 Unspecified diastolic (congestive) heart failure: Secondary | ICD-10-CM | POA: Insufficient documentation

## 2020-06-04 DIAGNOSIS — N186 End stage renal disease: Secondary | ICD-10-CM | POA: Insufficient documentation

## 2020-06-04 DIAGNOSIS — E11621 Type 2 diabetes mellitus with foot ulcer: Secondary | ICD-10-CM | POA: Diagnosis not present

## 2020-06-04 DIAGNOSIS — L97412 Non-pressure chronic ulcer of right heel and midfoot with fat layer exposed: Secondary | ICD-10-CM | POA: Diagnosis not present

## 2020-06-04 DIAGNOSIS — L97524 Non-pressure chronic ulcer of other part of left foot with necrosis of bone: Secondary | ICD-10-CM | POA: Diagnosis not present

## 2020-06-04 DIAGNOSIS — E1122 Type 2 diabetes mellitus with diabetic chronic kidney disease: Secondary | ICD-10-CM | POA: Insufficient documentation

## 2020-06-04 DIAGNOSIS — L97519 Non-pressure chronic ulcer of other part of right foot with unspecified severity: Secondary | ICD-10-CM | POA: Diagnosis not present

## 2020-06-04 DIAGNOSIS — L97402 Non-pressure chronic ulcer of unspecified heel and midfoot with fat layer exposed: Secondary | ICD-10-CM

## 2020-06-04 DIAGNOSIS — E08621 Diabetes mellitus due to underlying condition with foot ulcer: Secondary | ICD-10-CM

## 2020-06-04 DIAGNOSIS — Z833 Family history of diabetes mellitus: Secondary | ICD-10-CM | POA: Diagnosis not present

## 2020-06-04 DIAGNOSIS — Z20822 Contact with and (suspected) exposure to covid-19: Secondary | ICD-10-CM | POA: Diagnosis not present

## 2020-06-04 DIAGNOSIS — M869 Osteomyelitis, unspecified: Secondary | ICD-10-CM | POA: Insufficient documentation

## 2020-06-04 DIAGNOSIS — L97529 Non-pressure chronic ulcer of other part of left foot with unspecified severity: Secondary | ICD-10-CM | POA: Diagnosis not present

## 2020-06-04 DIAGNOSIS — E7800 Pure hypercholesterolemia, unspecified: Secondary | ICD-10-CM | POA: Diagnosis not present

## 2020-06-04 DIAGNOSIS — E1169 Type 2 diabetes mellitus with other specified complication: Secondary | ICD-10-CM | POA: Insufficient documentation

## 2020-06-04 HISTORY — PX: GRAFT APPLICATION: SHX6696

## 2020-06-04 HISTORY — PX: WOUND DEBRIDEMENT: SHX247

## 2020-06-04 LAB — CBC
HCT: 39.4 % (ref 39.0–52.0)
Hemoglobin: 11.6 g/dL — ABNORMAL LOW (ref 13.0–17.0)
MCH: 29.4 pg (ref 26.0–34.0)
MCHC: 29.4 g/dL — ABNORMAL LOW (ref 30.0–36.0)
MCV: 100 fL (ref 80.0–100.0)
Platelets: 391 10*3/uL (ref 150–400)
RBC: 3.94 MIL/uL — ABNORMAL LOW (ref 4.22–5.81)
RDW: 20.2 % — ABNORMAL HIGH (ref 11.5–15.5)
WBC: 8.1 10*3/uL (ref 4.0–10.5)
nRBC: 0 % (ref 0.0–0.2)

## 2020-06-04 LAB — BASIC METABOLIC PANEL
Anion gap: 16 — ABNORMAL HIGH (ref 5–15)
BUN: 25 mg/dL — ABNORMAL HIGH (ref 6–20)
CO2: 26 mmol/L (ref 22–32)
Calcium: 8.9 mg/dL (ref 8.9–10.3)
Chloride: 98 mmol/L (ref 98–111)
Creatinine, Ser: 7.27 mg/dL — ABNORMAL HIGH (ref 0.61–1.24)
GFR, Estimated: 8 mL/min — ABNORMAL LOW (ref >60)
Glucose, Bld: 103 mg/dL — ABNORMAL HIGH (ref 70–99)
Potassium: 4.5 mmol/L (ref 3.5–5.1)
Sodium: 140 mmol/L (ref 135–145)

## 2020-06-04 LAB — SARS CORONAVIRUS 2 BY RT PCR (HOSPITAL ORDER, PERFORMED IN ~~LOC~~ HOSPITAL LAB): SARS Coronavirus 2: POSITIVE — AB

## 2020-06-04 LAB — GLUCOSE, CAPILLARY
Glucose-Capillary: 88 mg/dL (ref 70–99)
Glucose-Capillary: 99 mg/dL (ref 70–99)

## 2020-06-04 SURGERY — DEBRIDEMENT, WOUND
Anesthesia: Monitor Anesthesia Care | Laterality: Bilateral

## 2020-06-04 MED ORDER — DEXAMETHASONE SODIUM PHOSPHATE 10 MG/ML IJ SOLN
INTRAMUSCULAR | Status: AC
  Filled 2020-06-04: qty 1

## 2020-06-04 MED ORDER — SODIUM CHLORIDE 0.9 % IV SOLN: INTRAVENOUS | Status: DC | PRN

## 2020-06-04 MED ORDER — BUPIVACAINE HCL (PF) 0.5 % IJ SOLN
INTRAMUSCULAR | Status: DC | PRN
  Administered 2020-06-04: 10 mL

## 2020-06-04 MED ORDER — GLYCOPYRROLATE PF 0.2 MG/ML IJ SOSY
PREFILLED_SYRINGE | INTRAMUSCULAR | Status: AC
  Filled 2020-06-04: qty 1

## 2020-06-04 MED ORDER — CHLORHEXIDINE GLUCONATE 0.12 % MT SOLN
15.0000 mL | Freq: Once | OROMUCOSAL | Status: AC
  Administered 2020-06-04: 15 mL via OROMUCOSAL

## 2020-06-04 MED ORDER — ONDANSETRON HCL 4 MG/2ML IJ SOLN: 4.0000 mg | Freq: Once | INTRAMUSCULAR | Status: DC | PRN

## 2020-06-04 MED ORDER — PROPOFOL 10 MG/ML IV BOLUS
INTRAVENOUS | Status: AC
  Filled 2020-06-04: qty 20

## 2020-06-04 MED ORDER — MIDAZOLAM HCL 5 MG/5ML IJ SOLN
INTRAMUSCULAR | Status: DC | PRN
  Administered 2020-06-04: 2 mg via INTRAVENOUS

## 2020-06-04 MED ORDER — LIDOCAINE HCL (PF) 1 % IJ SOLN
INTRAMUSCULAR | Status: AC
  Filled 2020-06-04: qty 30

## 2020-06-04 MED ORDER — LIDOCAINE HCL 1 % IJ SOLN
INTRAMUSCULAR | Status: DC | PRN
  Administered 2020-06-04: 10 mL

## 2020-06-04 MED ORDER — BUPIVACAINE HCL (PF) 0.5 % IJ SOLN
INTRAMUSCULAR | Status: AC
  Filled 2020-06-04: qty 30

## 2020-06-04 MED ORDER — FENTANYL CITRATE (PF) 100 MCG/2ML IJ SOLN
25.0000 ug | INTRAMUSCULAR | Status: DC | PRN
Start: 2020-06-04 — End: 2020-06-04

## 2020-06-04 MED ORDER — CHLORHEXIDINE GLUCONATE CLOTH 2 % EX PADS: 6.0000 | MEDICATED_PAD | Freq: Once | CUTANEOUS | Status: DC

## 2020-06-04 MED ORDER — ACETAMINOPHEN 160 MG/5ML PO SOLN: 325.0000 mg | ORAL | Status: DC | PRN

## 2020-06-04 MED ORDER — BUPIVACAINE-EPINEPHRINE (PF) 0.5% -1:200000 IJ SOLN
INTRAMUSCULAR | Status: DC | PRN
  Administered 2020-06-04: 15 mL via PERINEURAL

## 2020-06-04 MED ORDER — PHENYLEPHRINE 40 MCG/ML (10ML) SYRINGE FOR IV PUSH (FOR BLOOD PRESSURE SUPPORT)
PREFILLED_SYRINGE | INTRAVENOUS | Status: DC | PRN
  Administered 2020-06-04: 120 ug via INTRAVENOUS

## 2020-06-04 MED ORDER — FENTANYL CITRATE (PF) 100 MCG/2ML IJ SOLN
INTRAMUSCULAR | Status: AC
  Administered 2020-06-04: 50 ug via INTRAVENOUS
  Filled 2020-06-04: qty 2

## 2020-06-04 MED ORDER — LIDOCAINE 2% (20 MG/ML) 5 ML SYRINGE
INTRAMUSCULAR | Status: AC
  Filled 2020-06-04: qty 5

## 2020-06-04 MED ORDER — ACETAMINOPHEN 325 MG PO TABS: 325.0000 mg | ORAL_TABLET | ORAL | Status: DC | PRN

## 2020-06-04 MED ORDER — MIDAZOLAM HCL 2 MG/2ML IJ SOLN
1.0000 mg | INTRAMUSCULAR | Status: DC
  Administered 2020-06-04: 1 mg via INTRAVENOUS
  Filled 2020-06-04: qty 2

## 2020-06-04 MED ORDER — OXYCODONE HCL 5 MG PO TABS: 5.0000 mg | ORAL_TABLET | Freq: Once | ORAL | Status: DC | PRN

## 2020-06-04 MED ORDER — ONDANSETRON HCL 4 MG/2ML IJ SOLN
INTRAMUSCULAR | Status: DC | PRN
  Administered 2020-06-04: 4 mg via INTRAVENOUS

## 2020-06-04 MED ORDER — MEPERIDINE HCL 50 MG/ML IJ SOLN: 6.2500 mg | INTRAMUSCULAR | Status: DC | PRN

## 2020-06-04 MED ORDER — LIDOCAINE-EPINEPHRINE (PF) 1 %-1:200000 IJ SOLN
INTRAMUSCULAR | Status: AC
  Filled 2020-06-04: qty 30

## 2020-06-04 MED ORDER — BUPIVACAINE LIPOSOME 1.3 % IJ SUSP
INTRAMUSCULAR | Status: DC | PRN
  Administered 2020-06-04: 10 mL via PERINEURAL

## 2020-06-04 MED ORDER — FENTANYL CITRATE (PF) 100 MCG/2ML IJ SOLN
INTRAMUSCULAR | Status: AC
  Filled 2020-06-04: qty 2

## 2020-06-04 MED ORDER — KETAMINE HCL 10 MG/ML IJ SOLN
INTRAMUSCULAR | Status: AC
  Filled 2020-06-04: qty 1

## 2020-06-04 MED ORDER — FENTANYL CITRATE (PF) 100 MCG/2ML IJ SOLN: 50.0000 ug | INTRAMUSCULAR | Status: DC

## 2020-06-04 MED ORDER — PHENYLEPHRINE HCL (PRESSORS) 10 MG/ML IV SOLN
INTRAVENOUS | Status: AC
  Filled 2020-06-04: qty 1

## 2020-06-04 MED ORDER — LACTATED RINGERS IV SOLN: INTRAVENOUS | Status: DC

## 2020-06-04 MED ORDER — ONDANSETRON HCL 4 MG/2ML IJ SOLN
INTRAMUSCULAR | Status: AC
  Filled 2020-06-04: qty 2

## 2020-06-04 MED ORDER — SODIUM CHLORIDE 0.9 % IV SOLN: Freq: Once | INTRAVENOUS | Status: AC

## 2020-06-04 MED ORDER — MIDAZOLAM HCL 2 MG/2ML IJ SOLN
INTRAMUSCULAR | Status: AC
  Filled 2020-06-04: qty 2

## 2020-06-04 MED ORDER — KETAMINE HCL 10 MG/ML IJ SOLN
INTRAMUSCULAR | Status: DC | PRN
  Administered 2020-06-04: 30 mg via INTRAVENOUS
  Administered 2020-06-04: 20 mg via INTRAVENOUS

## 2020-06-04 MED ORDER — PROPOFOL 500 MG/50ML IV EMUL
INTRAVENOUS | Status: DC | PRN
  Administered 2020-06-04: 85 ug/kg/min via INTRAVENOUS

## 2020-06-04 MED ORDER — CEFAZOLIN SODIUM-DEXTROSE 2-4 GM/100ML-% IV SOLN
2.0000 g | INTRAVENOUS | Status: AC
  Administered 2020-06-04: 2 g via INTRAVENOUS
  Filled 2020-06-04: qty 100

## 2020-06-04 MED ORDER — OXYCODONE HCL 5 MG/5ML PO SOLN: 5.0000 mg | Freq: Once | ORAL | Status: DC | PRN

## 2020-06-04 MED ORDER — PROPOFOL 1000 MG/100ML IV EMUL
INTRAVENOUS | Status: AC
  Filled 2020-06-04: qty 100

## 2020-06-04 MED ORDER — FENTANYL CITRATE (PF) 250 MCG/5ML IJ SOLN
INTRAMUSCULAR | Status: DC | PRN
  Administered 2020-06-04: 50 ug via INTRAVENOUS

## 2020-06-04 MED ORDER — ESMOLOL HCL 100 MG/10ML IV SOLN
INTRAVENOUS | Status: DC | PRN
  Administered 2020-06-04: 20 mg via INTRAVENOUS

## 2020-06-04 MED ORDER — ORAL CARE MOUTH RINSE: 15.0000 mL | Freq: Once | OROMUCOSAL | Status: AC

## 2020-06-04 SURGICAL SUPPLY — 54 items
BLADE SURG 15 STRL LF DISP TIS (BLADE) ×2 IMPLANT
BLADE SURG 15 STRL SS (BLADE) ×4
BNDG CMPR 9X4 STRL LF SNTH (GAUZE/BANDAGES/DRESSINGS) ×1
BNDG CONFORM 2 STRL LF (GAUZE/BANDAGES/DRESSINGS) ×2 IMPLANT
BNDG ELASTIC 3X5.8 VLCR STR LF (GAUZE/BANDAGES/DRESSINGS) ×2 IMPLANT
BNDG ELASTIC 4X5.8 VLCR STR LF (GAUZE/BANDAGES/DRESSINGS) ×1 IMPLANT
BNDG ELASTIC 6X5.8 VLCR STR LF (GAUZE/BANDAGES/DRESSINGS) ×2 IMPLANT
BNDG ESMARK 4X9 LF (GAUZE/BANDAGES/DRESSINGS) ×2 IMPLANT
BNDG GAUZE ELAST 4 BULKY (GAUZE/BANDAGES/DRESSINGS) ×2 IMPLANT
BUR EGG ELITE 4.0 (BURR) ×2 IMPLANT
COVER BACK TABLE 60X90IN (DRAPES) ×2 IMPLANT
COVER WAND RF STERILE (DRAPES) IMPLANT
CUFF TOURN SGL QUICK 18X4 (TOURNIQUET CUFF) IMPLANT
DRAPE EXTREMITY T 121X128X90 (DISPOSABLE) ×2 IMPLANT
DRAPE IMP U-DRAPE 54X76 (DRAPES) ×2 IMPLANT
DRAPE OEC MINIVIEW 54X84 (DRAPES) ×2 IMPLANT
DRSG EMULSION OIL 3X3 NADH (GAUZE/BANDAGES/DRESSINGS) ×2 IMPLANT
DRSG PAD ABDOMINAL 8X10 ST (GAUZE/BANDAGES/DRESSINGS) ×1 IMPLANT
DURAPREP 26ML APPLICATOR (WOUND CARE) IMPLANT
ELECT REM PT RETURN 15FT ADLT (MISCELLANEOUS) ×2 IMPLANT
GAUZE 4X4 16PLY RFD (DISPOSABLE) IMPLANT
GAUZE SPONGE 4X4 12PLY STRL (GAUZE/BANDAGES/DRESSINGS) ×2 IMPLANT
GLOVE SURG ENC MOIS LTX SZ7.5 (GLOVE) ×4 IMPLANT
GLOVE SURG LTX SZ7.5 (GLOVE) ×2 IMPLANT
GLOVE SURG UNDER POLY LF SZ7.5 (GLOVE) ×2 IMPLANT
GOWN STRL REUS W/ TWL LRG LVL3 (GOWN DISPOSABLE) ×1 IMPLANT
GOWN STRL REUS W/TWL LRG LVL3 (GOWN DISPOSABLE) ×2
KIT BASIN OR (CUSTOM PROCEDURE TRAY) ×2 IMPLANT
MATRIX WOUND 3-LAYER 10X15 (Tissue) ×1 IMPLANT
MATRIX WOUND 3-LAYER 7X10 (Tissue) ×1 IMPLANT
MICROMATRIX 1000MG (Tissue) ×2 IMPLANT
NDL HYPO 25X1 1.5 SAFETY (NEEDLE) ×2 IMPLANT
NDL SAFETY ECLIPSE 18X1.5 (NEEDLE) IMPLANT
NEEDLE HYPO 18GX1.5 SHARP (NEEDLE)
NEEDLE HYPO 25X1 1.5 SAFETY (NEEDLE) ×4 IMPLANT
NS IRRIG 1000ML POUR BTL (IV SOLUTION) IMPLANT
PADDING CAST ABS 4INX4YD NS (CAST SUPPLIES) ×1
PADDING CAST ABS COTTON 4X4 ST (CAST SUPPLIES) ×1 IMPLANT
PENCIL SMOKE EVACUATOR (MISCELLANEOUS) ×2 IMPLANT
PROBE DEBRIDE SONICVAC MISONIX (TIP) ×1 IMPLANT
SOLUTION PARTIC MCRMTRX 1000MG (Tissue) IMPLANT
STOCKINETTE 6  STRL (DRAPES) ×2
STOCKINETTE 6 STRL (DRAPES) ×1 IMPLANT
STRIP SUTURE WOUND CLOSURE 1/2 (MISCELLANEOUS) IMPLANT
SUT MNCRL AB 3-0 PS2 18 (SUTURE) ×2 IMPLANT
SUT MNCRL AB 4-0 PS2 18 (SUTURE) IMPLANT
SUT MON AB 5-0 PS2 18 (SUTURE) IMPLANT
SUT PROLENE 3 0 PS 2 (SUTURE) ×2 IMPLANT
SYR 10ML LL (SYRINGE) IMPLANT
SYR BULB EAR ULCER 3OZ GRN STR (SYRINGE) ×2 IMPLANT
SYR CONTROL 10ML LL (SYRINGE) ×4 IMPLANT
TUBE IRRIGATION SET MISONIX (TUBING) ×1 IMPLANT
TUBING CONNECTING 10 (TUBING) ×2 IMPLANT
UNDERPAD 30X36 HEAVY ABSORB (UNDERPADS AND DIAPERS) ×2 IMPLANT

## 2020-06-04 NOTE — H&P (Signed)
Seen in pre-op. Scheduled for bilateral heel ulcer debridement, graft application. Discussed the surgery and post-op course. He has no further questions/concerns. NPO. Surgical consent signed  Full H&P in chart.   COVID +  Will proceed with surgery as I am concerned that with ongoing ulcerations waiting may cause worsening and he is already at risk of limb loss.

## 2020-06-04 NOTE — Transfer of Care (Signed)
Immediate Anesthesia Transfer of Care Note  Patient: [name] [name]  Procedure(s) Performed: DEBRIDEMENT WOUND BOTH FEET (Bilateral ) GRAFT APPLICATION (Bilateral )  Patient Location: PACU  Anesthesia Type:MAC  Level of Consciousness: awake, alert  and oriented  Airway & Oxygen Therapy: Patient Spontanous Breathing and Patient connected to face mask  Post-op Assessment: Report given to RN and Post -op Vital signs reviewed and stable  Post vital signs: Reviewed and stable  Last Vitals:  Vitals Value Taken Time  BP 128/90 06/04/20 1523  Temp 37.1 C 06/04/20 1523  Pulse 98 06/04/20 1530  Resp 10 06/04/20 1530  SpO2 100 % 06/04/20 1530  Vitals shown include unvalidated device data.  Last Pain:  Vitals:   06/04/20 1136  TempSrc:   PainSc: 9          Complications: No complications documented.

## 2020-06-04 NOTE — Discharge Instructions (Signed)
Do not change the bandages. If they need to be changed, please call our office at [phone]. Keep the feet elevated and try to limit the amount of walking. When up, wear the boot on the left and surgical shoe on the right.  Resume all home medications as prior      Person Under Monitoring Name: [name] [name]  Location: [place] [address]   Infection Prevention Recommendations for Individuals Confirmed to have, or Being Evaluated for, 2019 Novel Coronavirus (COVID-19) Infection Who Receive Care at Home  Individuals who are confirmed to have, or are being evaluated for, COVID-19 should follow the prevention steps below until a healthcare provider or local or state health department says they can return to normal activities.  Stay home except to get medical care You should restrict activities outside your home, except for getting medical care. Do not go to work, school, or public areas, and do not use public transportation or taxis.  Call ahead before visiting your doctor Before your medical appointment, call the healthcare provider and tell them that you have, or are being evaluated for, COVID-19 infection. This will help the healthcare provider's office take steps to keep other people from getting infected. Ask your healthcare provider to call the local or state health department.  Monitor your symptoms Seek prompt medical attention if your illness is worsening (e.g., difficulty breathing). Before going to your medical appointment, call the healthcare provider and tell them that you have, or are being evaluated for, COVID-19 infection. Ask your healthcare provider to call the local or state health department.  Wear a facemask You should wear a facemask that covers your nose and mouth when you are in the same room with other people and when you visit a healthcare provider. People who live with or visit you should also wear a facemask while they are in  the same room with you.  Separate yourself from other people in your home As much as possible, you should stay in a different room from other people in your home. Also, you should use a separate bathroom, if available.  Avoid sharing household items You should not share dishes, drinking glasses, cups, eating utensils, towels, bedding, or other items with other people in your home. After using these items, you should wash them thoroughly with soap and water.  Cover your coughs and sneezes Cover your mouth and nose with a tissue when you cough or sneeze, or you can cough or sneeze into your sleeve. Throw used tissues in a lined trash can, and immediately wash your hands with soap and water for at least 20 seconds or use an alcohol-based hand rub.  Wash your [facility] your hands often and thoroughly with soap and water for at least 20 seconds. You can use an alcohol-based hand sanitizer if soap and water are not available and if your hands are not visibly dirty. Avoid touching your eyes, nose, and mouth with unwashed hands.   Prevention Steps for Caregivers and Household Members of Individuals Confirmed to have, or Being Evaluated for, COVID-19 Infection Being Cared for in the Home  If you live with, or provide care at home for, a person confirmed to have, or being evaluated for, COVID-19 infection please follow these guidelines to prevent infection:  Follow healthcare provider's instructions Make sure that you understand and can help the patient follow any healthcare provider instructions for all care.  Provide for the patient's basic needs You should help the patient with  basic needs in the home and provide support for getting groceries, prescriptions, and other personal needs.  Monitor the patient's symptoms If they are getting sicker, call his or her medical provider and tell them that the patient has, or is being evaluated for, COVID-19 infection. This will help the healthcare  provider's office take steps to keep other people from getting infected. Ask the healthcare provider to call the local or state health department.  Limit the number of people who have contact with the patient  If possible, have only one caregiver for the patient.  Other household members should stay in another home or place of residence. If this is not possible, they should stay  in another room, or be separated from the patient as much as possible. Use a separate bathroom, if available.  Restrict visitors who do not have an essential need to be in the home.  Keep older adults, very young children, and other sick people away from the patient Keep older adults, very young children, and those who have compromised immune systems or chronic health conditions away from the patient. This includes people with chronic heart, lung, or kidney conditions, diabetes, and cancer.  Ensure good ventilation Make sure that shared spaces in the home have good air flow, such as from an air conditioner or an opened window, weather permitting.  Wash your hands often  Wash your hands often and thoroughly with soap and water for at least 20 seconds. You can use an alcohol based hand sanitizer if soap and water are not available and if your hands are not visibly dirty.  Avoid touching your eyes, nose, and mouth with unwashed hands.  Use disposable paper towels to dry your hands. If not available, use dedicated cloth towels and replace them when they become wet.  Wear a facemask and gloves  Wear a disposable facemask at all times in the room and gloves when you touch or have contact with the patient's blood, body fluids, and/or secretions or excretions, such as sweat, saliva, sputum, nasal mucus, vomit, urine, or feces.  Ensure the mask fits over your nose and mouth tightly, and do not touch it during use.  Throw out disposable facemasks and gloves after using them. Do not reuse.  Wash your hands immediately  after removing your facemask and gloves.  If your personal clothing becomes contaminated, carefully remove clothing and launder. Wash your hands after handling contaminated clothing.  Place all used disposable facemasks, gloves, and other waste in a lined container before disposing them with other household waste.  Remove gloves and wash your hands immediately after handling these items.  Do not share dishes, glasses, or other household items with the patient  Avoid sharing household items. You should not share dishes, drinking glasses, cups, eating utensils, towels, bedding, or other items with a patient who is confirmed to have, or being evaluated for, COVID-19 infection.  After the person uses these items, you should wash them thoroughly with soap and water.  Wash laundry thoroughly  Immediately remove and wash clothes or bedding that have blood, body fluids, and/or secretions or excretions, such as sweat, saliva, sputum, nasal mucus, vomit, urine, or feces, on them.  Wear gloves when handling laundry from the patient.  Read and follow directions on labels of laundry or clothing items and detergent. In general, wash and dry with the warmest temperatures recommended on the label.  Clean all areas the individual has used often  Clean all touchable surfaces, such as counters, tabletops,  doorknobs, bathroom fixtures, toilets, phones, keyboards, tablets, and bedside tables, every day. Also, clean any surfaces that may have blood, body fluids, and/or secretions or excretions on them.  Wear gloves when cleaning surfaces the patient has come in contact with.  Use a diluted bleach solution (e.g., dilute bleach with 1 part bleach and 10 parts water) or a household disinfectant with a label that says EPA-registered for coronaviruses. To make a bleach solution at home, add 1 tablespoon of bleach to 1 quart (4 cups) of water. For a larger supply, add  cup of bleach to 1 gallon (16 cups) of  water.  Read labels of cleaning products and follow recommendations provided on product labels. Labels contain instructions for safe and effective use of the cleaning product including precautions you should take when applying the product, such as wearing gloves or eye protection and making sure you have good ventilation during use of the product.  Remove gloves and wash hands immediately after cleaning.  Monitor yourself for signs and symptoms of illness Caregivers and household members are considered close contacts, should monitor their health, and will be asked to limit movement outside of the home to the extent possible. Follow the monitoring steps for close contacts listed on the symptom monitoring form.   ? If you have additional questions, contact your local health department or call the epidemiologist on call at [phone] (available 24/7). ? This guidance is subject to change. For the most up-to-date guidance from [facility], please refer to their website: [email or web address]

## 2020-06-04 NOTE — Anesthesia Procedure Notes (Signed)
Anesthesia Regional Block: Popliteal block   Pre-Anesthetic Checklist: ,, timeout performed, Correct Patient, Correct Site, Correct Laterality, Correct Procedure, Correct Position, site marked, Risks and benefits discussed,  Surgical consent,  Pre-op evaluation,  At surgeon's request and post-op pain management  Laterality: Left  Prep: chloraprep       Needles:  Injection technique: Single-shot  Needle Type: Echogenic Stimulator Needle     Needle Length: 5cm  Needle Gauge: 22     Additional Needles:   Procedures:, nerve stimulator,,, ultrasound used (permanent image in chart),,,,   Nerve Stimulator or Paresthesia:  Response: foot, 0.45 mA,   Additional Responses:   Narrative:  Start time: 06/04/2020 2:00 PM End time: 06/04/2020 2:06 PM Injection made incrementally with aspirations every 5 mL.  Performed by: Personally  Anesthesiologist: Janeece Riggers, MD  Additional Notes: Functioning IV was confirmed and monitors were applied.  A 74m 22ga Arrow echogenic stimulator needle was used. Sterile prep and drape,hand hygiene and sterile gloves were used. Ultrasound guidance: relevant anatomy identified, needle position confirmed, local anesthetic spread visualized around nerve(s)., vascular puncture avoided.  Image printed for medical record. Negative aspiration and negative test dose prior to incremental administration of local anesthetic. The patient tolerated the procedure well.

## 2020-06-04 NOTE — Progress Notes (Addendum)
Assisted Dr. Ambrose Pancoast with left, ultrasound guided, popliteal block. Side rails up, monitors on throughout procedure. See vital signs in flow sheet. Tolerated Procedure well. exparel given. Bracelet applied to patients arm.

## 2020-06-04 NOTE — Brief Op Note (Signed)
06/04/2020  3:23 PM  PATIENT:  [name] [name]  55 y.o. male  PRE-OPERATIVE DIAGNOSIS:  DIABETIC ULCER OF BOTH  FOOT  POST-OPERATIVE DIAGNOSIS:  DIABETIC ULCER OF BOTH  FOOT  PROCEDURE:  Procedure(s): DEBRIDEMENT WOUND BOTH FEET (Bilateral) GRAFT APPLICATION (Bilateral)  SURGEON:  Surgeon(s) and Role:    * Trula Slade, DPM - Primary  PHYSICIAN ASSISTANT:   ASSISTANTS: none   ANESTHESIA:   general  EBL:  10 cc   BLOOD ADMINISTERED:none  DRAINS: none   LOCAL MEDICATIONS USED:  OTHER 10 cc lidocaine and marcaine plain  SPECIMEN:  No Specimen  DISPOSITION OF SPECIMEN:  N/A  COUNTS:  YES  TOURNIQUET:  * No tourniquets in log *  DICTATION: .Dragon Dictation  PLAN OF CARE: Discharge to home after PACU  PATIENT DISPOSITION:  PACU - hemodynamically stable.   Delay start of Pharmacological VTE agent (>24hrs) due to surgical blood loss or risk of bleeding: no  Intraoperative finding: No purulence or signs of abscess. Debrided bilateral ulcers, hemostasis obtained. Cytal, Acell graft applied.

## 2020-06-04 NOTE — Progress Notes (Signed)
H&P received from [place] and taken to Short Stay.

## 2020-06-05 ENCOUNTER — Encounter (HOSPITAL_COMMUNITY): Payer: Self-pay | Admitting: Podiatry

## 2020-06-05 ENCOUNTER — Telehealth: Payer: Self-pay | Admitting: Physician Assistant

## 2020-06-05 ENCOUNTER — Telehealth: Payer: Self-pay | Admitting: *Deleted

## 2020-06-05 DIAGNOSIS — N2581 Secondary hyperparathyroidism of renal origin: Secondary | ICD-10-CM | POA: Diagnosis not present

## 2020-06-05 DIAGNOSIS — U071 COVID-19: Secondary | ICD-10-CM | POA: Diagnosis not present

## 2020-06-05 DIAGNOSIS — M86172 Other acute osteomyelitis, left ankle and foot: Secondary | ICD-10-CM | POA: Diagnosis not present

## 2020-06-05 DIAGNOSIS — N186 End stage renal disease: Secondary | ICD-10-CM | POA: Diagnosis not present

## 2020-06-05 DIAGNOSIS — D631 Anemia in chronic kidney disease: Secondary | ICD-10-CM | POA: Diagnosis not present

## 2020-06-05 NOTE — Telephone Encounter (Signed)
Called and left a message for the patient stating that I was calling to see how the patient was doing after having surgery with Dr Jacqualyn Posey and to call the office if any concerns or questions. Lattie Haw

## 2020-06-05 NOTE — Anesthesia Postprocedure Evaluation (Signed)
Anesthesia Post Note  Patient: [name] [name]  Procedure(s) Performed: DEBRIDEMENT WOUND BOTH FEET (Bilateral ) GRAFT APPLICATION (Bilateral )     Patient location during evaluation: PACU Anesthesia Type: MAC Level of consciousness: awake and alert Pain management: pain level controlled Vital Signs Assessment: post-procedure vital signs reviewed and stable Respiratory status: spontaneous breathing, nonlabored ventilation, respiratory function stable and patient connected to nasal cannula oxygen Cardiovascular status: stable and blood pressure returned to baseline Postop Assessment: no apparent nausea or vomiting Anesthetic complications: no   No complications documented.  Last Vitals:  Vitals:   06/04/20 1615 06/04/20 1630  BP: 124/74 122/86  Pulse: 97 95  Resp: 16 16  Temp: 36.8 C   SpO2: 94% 96%    Last Pain:  Vitals:   06/04/20 1615  TempSrc:   PainSc: 0-No pain                 Kiev Labrosse

## 2020-06-05 NOTE — Telephone Encounter (Signed)
Called to discuss with patient about COVID-19 symptoms and the use of one of the available treatments for those with mild to moderate Covid symptoms and at a high risk of hospitalization.  Pt appears to qualify for outpatient treatment due to co-morbid conditions and/or a member of an at-risk group in accordance with the FDA Emergency Use Authorization.    Unable to reach pt - left VM with hotline #  Angelena Form

## 2020-06-06 DIAGNOSIS — N186 End stage renal disease: Secondary | ICD-10-CM | POA: Diagnosis not present

## 2020-06-06 DIAGNOSIS — L97419 Non-pressure chronic ulcer of right heel and midfoot with unspecified severity: Secondary | ICD-10-CM | POA: Diagnosis not present

## 2020-06-06 DIAGNOSIS — L97429 Non-pressure chronic ulcer of left heel and midfoot with unspecified severity: Secondary | ICD-10-CM | POA: Diagnosis not present

## 2020-06-06 DIAGNOSIS — N2581 Secondary hyperparathyroidism of renal origin: Secondary | ICD-10-CM | POA: Diagnosis not present

## 2020-06-06 DIAGNOSIS — E11621 Type 2 diabetes mellitus with foot ulcer: Secondary | ICD-10-CM | POA: Diagnosis not present

## 2020-06-06 DIAGNOSIS — U071 COVID-19: Secondary | ICD-10-CM | POA: Diagnosis not present

## 2020-06-06 DIAGNOSIS — E1169 Type 2 diabetes mellitus with other specified complication: Secondary | ICD-10-CM | POA: Diagnosis not present

## 2020-06-06 DIAGNOSIS — D631 Anemia in chronic kidney disease: Secondary | ICD-10-CM | POA: Diagnosis not present

## 2020-06-06 DIAGNOSIS — M86172 Other acute osteomyelitis, left ankle and foot: Secondary | ICD-10-CM | POA: Diagnosis not present

## 2020-06-06 DIAGNOSIS — Z4781 Encounter for orthopedic aftercare following surgical amputation: Secondary | ICD-10-CM | POA: Diagnosis not present

## 2020-06-06 DIAGNOSIS — M869 Osteomyelitis, unspecified: Secondary | ICD-10-CM | POA: Diagnosis not present

## 2020-06-06 NOTE — Op Note (Signed)
PRE-OPERATIVE DIAGNOSIS:  DIABETIC ULCER OF BOTH  FOOT  POST-OPERATIVE DIAGNOSIS:  DIABETIC ULCER OF BOTH  FOOT  PROCEDURE:  Procedure(s): DEBRIDEMENT WOUND BOTH FEET (Bilateral) GRAFT APPLICATION (Bilateral)  SURGEON:  Surgeon(s) and Role:    * [name] [name], DPM - Primary  PHYSICIAN ASSISTANT:   ASSISTANTS: none   ANESTHESIA:   general  EBL:  10 cc   BLOOD ADMINISTERED:none  DRAINS: none   LOCAL MEDICATIONS USED:  OTHER 10 cc lidocaine and marcaine plain  SPECIMEN:  No Specimen  DISPOSITION OF SPECIMEN:  N/A  COUNTS:  YES  TOURNIQUET:  * No tourniquets in log *  DICTATION: .Dragon Dictation  PLAN OF CARE: Discharge to home after PACU  PATIENT DISPOSITION:  PACU - hemodynamically stable.   Delay start of Pharmacological VTE agent (>24hrs) due to surgical blood loss or risk of bleeding: no  Intraoperative finding: No purulence or signs of abscess. Debrided bilateral ulcers, hemostasis obtained. Cytal, Acell graft applied.   Indication for surgery: [name] [name] previously had ulceration, infection on the plantar aspect the left foot when she underwent grafting as well as bone biopsy previously.  He is currently on antibiotics per infectious disease.  He also has had a wound on the right side was becoming more tender.  Due to the chronic nature of the ulcerations I discussed repeat graft application as well as debridement of bilateral heels.  Given the extent and the depth of the wound in the left heel I discussed with him serial debridements, graft applications.  He understands that he is at high risk of amputation tomorrow try to prevent this.  He is also previously been seen by vascular surgery.  We discussed the surgery as well as postoperative course.  Alternatives risks and complications were discussed.  Surgical consent was signed.  Incidentally the patient tested positive for COVID on preop screening.  He has no symptoms.  Procedure in  detail: The patient was both verbally and visually identified by myself and nursing staff and the anesthesia staff.  He was then transferred to the operating room.  Stretcher placed on the table in supine position.  Preoperatively he had a blood block performed with anesthesia staff in the left side.  After adequate plane of anesthesia was obtained the bilateral lower extremities and scrubbed prepped and draped in normal sterile fashion.  A mixture of 10 cc of lidocaine, Marcaine plain was infiltrated in a regional block fashion around the wound on the right side.  Attention was first directed to the left foot.  I utilized the Misnox ultrasound debridement system in order to debride the wound.  Also loss of 15 blade scalpel to sharply debride any nonviable devitalized tissue.  The wound did not probe to bone but was through the fat layer to the muscle.  There is no purulence or abscess or any signs of deeper infection.  Prior to debridement there was necrotic, fibrotic, granulation tissue.  After debridement there was granular tissue in the wound bed.  The wound measured 4 x 3.5 x 1 cm after debridement.  At this time the wound was copiously irrigated saline hemostasis was achieved.  At this time I utilized a Cytal graft, and 750cc of micromatrix to the wound.  The graft that it was available was a 10 x 7 graft was cut to size of 5 x 5 cm in place of the wound.  It was secured in place with skin staples.  Attention was then directed to the wound on  the right side.  There is necrotic tissue to the plantar aspect the heel.  I utilized a 15 by scalpel debride the necrotic tissue.  I then utilized the Misonix ultrasound debridement system debride the wound on the healthy tissue.  There is no purulence, deep abscess.  This was the level of the fat layer.  There is no probing to bone, undermining or tunneling.  This wound measured 2 x 2 x 0.5 cm.  There is no signs of infection.  Again the wound was irrigated with  saline hemostasis was achieved.  I had a 5 x 5 cm graft that I utilized 3 x 3 cm of the graft as well as 250 mL's of the micro matrix.  The graft was secured in place by skin staples.  Adaptic was applied followed by surgilube.  Dressing was then applied including 4 x 4's, ABD pads, Kerlix, Ace.  He was awoken from anesthesia and found to have tolerated the procedure well and complications.  Transferred to PACU vital stable vascular status intact.

## 2020-06-07 DIAGNOSIS — M86172 Other acute osteomyelitis, left ankle and foot: Secondary | ICD-10-CM | POA: Diagnosis not present

## 2020-06-08 NOTE — Progress Notes (Signed)
Subjective: [name] [name] is a 55 y.o. is seen today in office s/p left heel ulcer debridement preformed on 05/14/2020.  He presents here for dressing change and also is scheduled for surgery on Wednesday for wound debridement, graft application.  He states that he wants to go and proceed with the surgery this week.  He has no new concerns today other than he has been having pain.  He does take oxycodone 10 mg 3 times a day at baseline for his fingers.  Currently denies any fevers, chills, nausea, vomiting.  No calf pain, chest pain, shortness of breath.  Objective: General: No acute distress, AAOx3  DP/PT pulses palpable 2/4, CRT < 3 sec to all digits.  LEFT foot: Ulceration of the plantar aspect.  Which is mostly fibrogranular.  There is no probing to bone compartment or tunneling.  There is necrotic tissue along the periphery of the wound as well.  There is no fluctuance or crepitation peer there is no malodor.  No drainage or pus or signs of infection or abscess. RIGHT foot: Small eschar on the plantar aspect of the heel without any drainage or pus.No surrounding erythema, ascending cellulitis.  No fluctuation crepitation there is malodor.   No other areas of tenderness to bilateral lower extremities.  Appears to be stable but he has been getting pain to the area as well. No pain with calf compression, swelling, warmth, erythema.   Assessment and Plan:  Status post left heel ulcer debridement, right heel wound  -Treatment options discussed including all alternatives, risks, and complications -Dressing was changed bilaterally. -I discussed the return to surgery on Wednesday for debridement as well as catheter location.  He like to have this done on both sides.  We will change the procedure have bilateral heel wound debridement, graft application.  We discussed the surgery as well as postoperative course he wishes to proceed. -The incision placement as well as the postoperative course was  discussed with the patient. I discussed risks of the surgery which include, but not limited to, infection, bleeding, pain, swelling, need for further surgery, delayed or nonhealing, painful or ugly scar, numbness or sensation changes,  recurrence, transfer lesions, further deformity, DVT/PE, loss of toe/foot/leg. Patient understands these risks and wishes to proceed with surgery. The surgical consent was reviewed with the patient all 3 pages were signed. No promises or guarantees were given to the outcome of the procedure. All questions were answered to the best of my ability. Before the surgery the patient was encouraged to call the office if there is any further questions. The surgery will be performed at [facility] on an outpatient basis.  Trula Slade DPM

## 2020-06-09 ENCOUNTER — Other Ambulatory Visit: Payer: Self-pay

## 2020-06-09 ENCOUNTER — Ambulatory Visit (INDEPENDENT_AMBULATORY_CARE_PROVIDER_SITE_OTHER): Payer: Medicare Other | Admitting: Podiatry

## 2020-06-09 DIAGNOSIS — D631 Anemia in chronic kidney disease: Secondary | ICD-10-CM | POA: Diagnosis not present

## 2020-06-09 DIAGNOSIS — L97412 Non-pressure chronic ulcer of right heel and midfoot with fat layer exposed: Secondary | ICD-10-CM

## 2020-06-09 DIAGNOSIS — U071 COVID-19: Secondary | ICD-10-CM | POA: Diagnosis not present

## 2020-06-09 DIAGNOSIS — M86172 Other acute osteomyelitis, left ankle and foot: Secondary | ICD-10-CM | POA: Diagnosis not present

## 2020-06-09 DIAGNOSIS — E08621 Diabetes mellitus due to underlying condition with foot ulcer: Secondary | ICD-10-CM

## 2020-06-09 DIAGNOSIS — N2581 Secondary hyperparathyroidism of renal origin: Secondary | ICD-10-CM | POA: Diagnosis not present

## 2020-06-09 DIAGNOSIS — E1165 Type 2 diabetes mellitus with hyperglycemia: Secondary | ICD-10-CM

## 2020-06-09 DIAGNOSIS — N186 End stage renal disease: Secondary | ICD-10-CM | POA: Diagnosis not present

## 2020-06-09 DIAGNOSIS — L97524 Non-pressure chronic ulcer of other part of left foot with necrosis of bone: Secondary | ICD-10-CM

## 2020-06-10 ENCOUNTER — Encounter: Payer: Medicare Other | Admitting: Podiatry

## 2020-06-10 DIAGNOSIS — E1169 Type 2 diabetes mellitus with other specified complication: Secondary | ICD-10-CM | POA: Diagnosis not present

## 2020-06-10 DIAGNOSIS — L97419 Non-pressure chronic ulcer of right heel and midfoot with unspecified severity: Secondary | ICD-10-CM | POA: Diagnosis not present

## 2020-06-10 DIAGNOSIS — Z4781 Encounter for orthopedic aftercare following surgical amputation: Secondary | ICD-10-CM | POA: Diagnosis not present

## 2020-06-10 DIAGNOSIS — L97429 Non-pressure chronic ulcer of left heel and midfoot with unspecified severity: Secondary | ICD-10-CM | POA: Diagnosis not present

## 2020-06-10 DIAGNOSIS — M869 Osteomyelitis, unspecified: Secondary | ICD-10-CM | POA: Diagnosis not present

## 2020-06-10 DIAGNOSIS — E11621 Type 2 diabetes mellitus with foot ulcer: Secondary | ICD-10-CM | POA: Diagnosis not present

## 2020-06-11 ENCOUNTER — Inpatient Hospital Stay: Payer: Medicare Other | Admitting: Infectious Diseases

## 2020-06-11 ENCOUNTER — Other Ambulatory Visit: Payer: Self-pay

## 2020-06-11 DIAGNOSIS — M86172 Other acute osteomyelitis, left ankle and foot: Secondary | ICD-10-CM | POA: Diagnosis not present

## 2020-06-11 DIAGNOSIS — N2581 Secondary hyperparathyroidism of renal origin: Secondary | ICD-10-CM | POA: Diagnosis not present

## 2020-06-11 DIAGNOSIS — D631 Anemia in chronic kidney disease: Secondary | ICD-10-CM | POA: Diagnosis not present

## 2020-06-11 DIAGNOSIS — N186 End stage renal disease: Secondary | ICD-10-CM | POA: Diagnosis not present

## 2020-06-11 DIAGNOSIS — U071 COVID-19: Secondary | ICD-10-CM | POA: Diagnosis not present

## 2020-06-11 NOTE — Progress Notes (Deleted)
[facility]                                                             [place], [place], [place], [address]                                                                  Phn. 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 However, this will require having a central line placed. Hence, will continue Vancomycin and add Amoxicillin for double coverage    Plan Continue vancomycin with HD as is DC cefepime and metronidazole Will start on Amoxicillin '500mg'$  PO q24 hrs, dose to be given AD on dialysis days  Fu in 2 weeks    All questions and concerns were discussed and addressed. Patient verbalized understanding of the plan. 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 He was seen by podiatry on day of ED visit,  undergone nail debridement and was advised to to to the ED given DFU with increasing pain and concerns for osteomyelitis. Recent MRI Left heel on 2/16 with > early OM. He underwent debridement and bone biopsy on 2/23 by Podiatry, Path with of left heel showing necrotizing and suppurative inflammation.  OR cx with Actinomyces. Podiatry recommending treating as osteo given the depth of the wound. Vascular SX consulted with no  concerns of poor blood flow to the Lower extremities. ABI WNL. Patient was discharged on 2/25 with a plan to continue Vancomycin, cefepime and metronidazole for 6 weeks.   Patient underwent bilateral heel debridement and bilateral graft application on 0000000 with Dr Jacqualyn Posey with following findings. OR note reviewed in detail  Intraoperative finding: No purulence or signs of abscess. Debrided bilateral ulcers, hemostasis obtained. Cytal, Acell graft applied.  06/11/20  ROS: Constitutional: Negative for fever, chills, activity change, appetite change, fatigue and unexpected weight change.  HENT: Negative for congestion, sore throat, rhinorrhea, sneezing, trouble swallowing and sinus pressure.  Eyes: Negative for photophobia and visual disturbance.  Respiratory: Negative for cough, chest tightness, shortness of breath, wheezing and stridor.  Cardiovascular: Negative for chest pain, palpitations and  leg swelling.  Gastrointestinal: Negative for nausea, vomiting, abdominal pain, diarrhea, constipation, blood in stool, abdominal distention and anal bleeding.  Genitourinary: Negative for dysuria, hematuria, flank pain and difficulty urinating.  Musculoskeletal: Negative for myalgias, back pain, joint swelling, arthralgias and gait problem.  Skin: Negative for color change, pallor, rash and wound.  Neurological: Negative for dizziness, tremors, weakness and light-headedness.  Hematological: Negative for adenopathy. Does not bruise/bleed easily.  Psychiatric/Behavioral: Negative for behavioral problems, confusion, sleep disturbance, dysphoric mood, decreased concentration and agitation.    Past Medical History:  Diagnosis Date  . Anal infection    posterior anal canal  . Anemia, chronic renal failure   . Atrophic kidney    BILATERAL  . DM type 2 causing ESRD [facility])    Nephrologist-- dr [name] [facility])--  on hemodialysis since June 2012 at  [name]  TTS  . Hemodialysis patient  [facility])    at [place] on Tues/ Thur/Sat/schedule  . Hemorrhoids   . Hepatitis B antibody positive   . History of pleural effusion    bilateral  . Hyperparathyroidism, secondary renal ([place])   . Hypertension   . Ischemic cardiomyopathy    per echo 07-01-2014  ef 45%  . LAFB (left anterior fascicular block)   . Peripheral neuropathy   . Systolic and diastolic CHF, chronic ([place])    CARDIOLOGIST-  DR Daneen Schick ([place])  AND DR [name] ([name])   Past Surgical History:  Procedure Laterality Date  . APPENDECTOMY  09-12-2004   laparotomy w/ drainage peritinitis  . AV FISTULA PLACEMENT  02-27-2010   right forearm (RADIOCEPHALIC)  . AV FISTULA REPAIR  10-30-2010  . BONE BIOPSY Left 05/14/2020   Procedure: BONE BIOPSY;  Surgeon: Trula Slade, DPM;  Location: [facility];  Service: Podiatry;  Laterality: Left;  . CARDIOVASCULAR STRESS TEST  10-29-2011   dr Daneen Schick   Low risk scan;  mild perfusion defect seen in the basal inferoseptal, basal inferior and mid inferior regions consistent with an infarct/scar and/or overlying attenuation/  mild to moderate global LVSF,  ef 40-45%  . DOBUTAMINE STRESS ECHO  07-23-2012   [name]   abnormal ;  at rest estimated lvef 25-30% and global severe LV hypokinesis ;  no cp during stress and achieved 85% maxium predicted heart rate;  negative stress ECG for inducible ischemia;  estimated lvef with stress 35-40%;  augmentation of wall segments consistant with cardiomyopathy and differential fibrosis  . FISTULOTOMY N/A 03/26/2015   Procedure: FISTULOTOMY;  Surgeon: [name], MD;  Location: [facility];  Service: General;  Laterality: N/A;  . GRAFT APPLICATION Left 0000000   Procedure: GRAFT APPLICATION;  Surgeon: Trula Slade, DPM;  Location: [facility];  Service: Podiatry;  Laterality: Left;  [name] GRAFT APPLICATION Bilateral 0000000   Procedure: GRAFT APPLICATION;  Surgeon: Trula Slade, DPM;  Location: [facility];  Service: Podiatry;  Laterality: Bilateral;  . INCISION AND DRAINAGE ABSCESS N/A 03/26/2015   Procedure: ANAL INCISION AND DRAINAGE;  Surgeon: [name], MD;  Location: [facility];  Service: General;  Laterality: N/A;  . RETINAL DETACHMENT SURGERY Left 2011   incomplete repair/ needs eye drops to keep pressure down  . TEE WITHOUT CARDIOVERSION N/A 10/17/2017   Procedure: TRANSESOPHAGEAL ECHOCARDIOGRAM (TEE);  Surgeon: Josue Hector, MD;  Location: [facility];  Service: Cardiovascular;  Laterality: N/A;  . TRANSTHORACIC ECHOCARDIOGRAM  07-01-2014    done at [facility]   grade 1 diastolic dysfunction,  ef 45%/  trace TR and PR  . WOUND DEBRIDEMENT Left 05/14/2020   Procedure: DEBRIDEMENT WOUND;  Surgeon: Trula Slade, DPM;  Location: [facility];  Service: Podiatry;  Laterality: Left;  . WOUND DEBRIDEMENT Bilateral 06/04/2020   Procedure: DEBRIDEMENT WOUND BOTH FEET;  Surgeon: Trula Slade, DPM;  Location: [facility];  Service: Podiatry;  Laterality: Bilateral;   Current Outpatient Medications on File Prior to Visit  Medication Sig Dispense Refill  . aspirin 325 MG tablet Take 325 mg by mouth every evening.    [name] atorvastatin (LIPITOR) 10 MG tablet Take 10 mg by mouth every evening.    . calcium acetate (PHOSLO) 667 MG capsule Take 1,334 mg by mouth 2 (two) times daily with a meal.    . ceFEPIme 2 g in sodium chloride 0.9 % 100 mL Inject 2 g into the vein every Tuesday, Thursday, and Saturday at 6 PM for 17 doses. (Patient taking differently: Inject 2 g into the vein Every Tuesday,Thursday,and Saturday with dialysis.)    . celecoxib (CELEBREX) 100 MG capsule Take 100 mg by mouth daily as needed (pain).    . Continuous Blood Gluc Sensor (FREESTYLE LIBRE 14 DAY SENSOR) MISC See admin instructions.    . cycloSPORINE, PF, (CEQUA) 0.09 % SOLN Place 1 drop into both eyes 2 (two) times daily.    . febuxostat (ULORIC) 40 MG tablet Take 40 mg by  mouth every evening.    . ferric citrate (AURYXIA) 1 GM 210 MG(Fe) tablet Take 420 mg by mouth 2 (two) times daily with a meal.    . gabapentin (NEURONTIN) 300 MG capsule Take 300 mg by mouth 3 (three) times daily.  3  . ibuprofen (ADVIL) 200 MG tablet Take 600 mg by mouth 3 (three) times daily as needed (pain).    . Insulin Disposable Pump (OMNIPOD DASH 5 PACK PODS) MISC Inject into the skin. Use with Humalog    . insulin lispro (HUMALOG) 100 UNIT/ML injection Inject 0-3 Units into the skin 3 (three) times daily before meals.    . lidocaine (XYLOCAINE) 5 % ointment Apply 1 application topically 2 (two) times daily as needed (finger pain).    . metroNIDAZOLE (FLAGYL) 500 MG tablet Take 1 tablet (500 mg total) by mouth every 8 (eight) hours. 123 tablet 0  . midodrine (PROAMATINE) 5 MG tablet Take 5 mg by mouth Every Tuesday,Thursday,and Saturday with dialysis.    . Multiple Vitamin (MULTIVITAMIN WITH MINERALS) TABS tablet Take 1 tablet by mouth every evening.    . ondansetron (ZOFRAN) 4 MG tablet Take 4 mg by mouth 3 (three) times daily as needed for nausea or vomiting.    . sucroferric oxyhydroxide (VELPHORO) 500 MG chewable tablet Chew 500 mg by mouth See admin instructions. Chew one tablet (500 mg) by mouth twice daily - with lunch and supper    . triamcinolone cream (KENALOG) 0.1 % Apply 1 application topically daily as needed (itching).    . vancomycin (VANCOCIN) 1-5 GM/200ML-% SOLN Inject 200 mLs (1,000 mg total) into the vein Every Tuesday,Thursday,and Saturday with dialysis. 4000 mL    No current facility-administered medications on file prior to visit.   No Known Allergies  Social History   Socioeconomic History  . Marital status: Single    Spouse name: Not on file  . Number of children: Not on file  . Years of education: Not on file  . Highest education level: Not on file  Occupational History  . Occupation: Retired  Tobacco Use  . Smoking status: Never Smoker  . Smokeless  tobacco: Never Used  Vaping Use  . Vaping Use: Never used  Substance and Sexual Activity  . Alcohol use: No  . Drug use: No  . Sexual activity: Not Currently  Other Topics Concern  . Not on file  Social History Narrative  . Not on file   Social Determinants of Health   Financial Resource Strain: Not on file  Food Insecurity: Not on file  Transportation Needs: Not on file  Physical Activity: Not on file  Stress: Not on file  Social Connections: Not on file  Intimate Partner Violence: Not on file    Vitals   Examination  General - not in acute distress, comfortably sitting in chair HEENT - PEERLA, no pallor and no icterus Chest - b/l clear air entry, no additional sounds CVS- Normal s1s2, RRR Abdomen - Soft, Non tender , non distended Ext- no pedal edema Neuro: grossly normal Back - WNL Psych : calm and cooperative   Recent labs   Pertinent Microbiology Results for orders placed or performed during the hospital encounter of 06/04/20  SARS Coronavirus 2 by RT PCR (hospital order, performed in [facility] hospital lab) Nasopharyngeal Nasopharyngeal Swab     Status: Abnormal   Collection Time: 06/04/20 10:58 AM   Specimen: Nasopharyngeal Swab  Result Value Ref Range Status   SARS Coronavirus 2 POSITIVE (A) NEGATIVE Final    Comment: RESULT CALLED TO, READ BACK BY AND VERIFIED WITH: COOPER,J RN '@1211'$  ON 06/04/20 JACKSON,K (NOTE) SARS-CoV-2 target nucleic acids are DETECTED  SARS-CoV-2 RNA is generally detectable in upper respiratory specimens  during the acute phase of infection.  Positive results are indicative  of the presence of the identified virus, but do not rule out bacterial infection or co-infection with other pathogens not detected by the test.  Clinical correlation with patient history and  other diagnostic information is necessary to determine patient infection status.  The expected result is negative.  Fact Sheet for Patients:    StrictlyIdeas.no   Fact Sheet for Healthcare Providers:   BankingDealers.co.za    This test is not yet approved or cleared by the [place] FDA and  has been authorized for detection and/or diagnosis of SARS-CoV-2 by FDA under an Emergency Use Authorization (EUA).  This EUA will remain in effect (meaning thi s test can be used) for the duration of  the COVID-19 declaration under Section 564(b)(1) of the Act, 21 U.S.C. section 360-bbb-3(b)(1), unless the authorization is terminated or revoked sooner.  Performed at [facility], [place] [address], [place], [place] [address]       Pertinent pathology  FINAL MICROSCOPIC DIAGNOSIS:   A. SOFT TISSUE, LEFT HEEL, DEBRIDEMENT:  - Soft tissue with necrotizing/ suppurative inflammation   B. BONE, LEFT CALCANEOUS, BIOPSY:  - Fragments of bone and marrow elements     Pertinent Imaging All pertinent labs/Imagings/notes reviewed. All pertinent plain films and CT images have been personally visualized and interpreted; radiology reports have been reviewed. Decision making incorporated into the Impression / Recommendations.  I have spent 60 minutes for this patient encounter including  review of prior medical records with greater than 50% of time in face to face counsel of the patient/discussing diagnostics and plan of care.   Electronically signed by:  Rosiland Oz, MD Infectious Disease Physician [facility] [address] [place],  [place] [address] Phone: [phone]  Fax: [phone]

## 2020-06-12 ENCOUNTER — Telehealth: Payer: Self-pay | Admitting: *Deleted

## 2020-06-12 DIAGNOSIS — M869 Osteomyelitis, unspecified: Secondary | ICD-10-CM

## 2020-06-12 DIAGNOSIS — E1169 Type 2 diabetes mellitus with other specified complication: Secondary | ICD-10-CM

## 2020-06-12 DIAGNOSIS — L97419 Non-pressure chronic ulcer of right heel and midfoot with unspecified severity: Secondary | ICD-10-CM | POA: Diagnosis not present

## 2020-06-12 DIAGNOSIS — Z4781 Encounter for orthopedic aftercare following surgical amputation: Secondary | ICD-10-CM | POA: Diagnosis not present

## 2020-06-12 DIAGNOSIS — L97429 Non-pressure chronic ulcer of left heel and midfoot with unspecified severity: Secondary | ICD-10-CM | POA: Diagnosis not present

## 2020-06-12 DIAGNOSIS — E11621 Type 2 diabetes mellitus with foot ulcer: Secondary | ICD-10-CM | POA: Diagnosis not present

## 2020-06-12 MED ORDER — AMOXICILLIN 500 MG PO CAPS: 500.0000 mg | ORAL_CAPSULE | Freq: Every day | ORAL | 0 refills | Status: DC

## 2020-06-12 NOTE — Telephone Encounter (Signed)
-----   Message from Rosiland Oz, MD sent at 06/12/2020  8:07 AM EDT ----- Regarding: Change in antibiotics Please let his HD center know to DC cefepime and continue only Vancomycin for HD   Let patient know to DC PO metronidazole and start taking Amoxicillin 500 mg PO daily ( on dialysis days, take dose after HD).   These changes are made based on the updated cultures growing Actinomyces.

## 2020-06-12 NOTE — Telephone Encounter (Signed)
He has an appointment with me next Friday. Send him amoxicillin refill '500mg'$  po q24 hrs for 2 weeks. Continue vancomycin with HD as is.

## 2020-06-12 NOTE — Telephone Encounter (Signed)
RN contacted patient to notify him of the changes to his IV and oral antibiotics, confirm his pharmacy for oral amoxicillin, and see where he gets dialysis. Please advise how long he will need these medications before I send in the orders. Dialysis will need stop date for vancomycin; will need to [facility]. Landis Gandy, RN

## 2020-06-12 NOTE — Progress Notes (Signed)
Subjective: [name] [name] is a 55 y.o. is seen today in office s/p bilateral heel wound debridement, graft application preformed on 06/04/2020.  He still gets discomfort but he is taking pain medication with some baseline for his hands.  Denies any systemic complaints such as fevers, chills, nausea, vomiting. No calf pain, chest pain, shortness of breath.   Incidentally he tested positive the day of surgery for COVID.  He was seen today with proper PPE and precautions to avoid transmission.  Objective: General: No acute distress, AAOx3  DP/PT pulses palpable 2/4, CRT < 3 sec to all digits.  Bilateral feet: Grafts are intact with staples.  There is no surrounding erythema, ascending cellulitis there is no drainage or pus identified cover the wound.  There is bloody drainage on the bandages bilaterally.  There is no warmth of the feet.  No other open lesions identified today.   No pain with calf compression, swelling, warmth, erythema.   Assessment and Plan:  Status post bilateral heel wound debridement, graft application, doing well with no complications   -Treatment options discussed including all alternatives, risks, and complications -Dressings were changed today.  Adaptic was applied followed by his surgical wound with a dry sterile dressing. -Continues to limit weightbearing.  Encouraged elevation.  Cam boot on the left and surgical shoe on the right.  Dispensed surgical shoe today.  Trula Slade DPM

## 2020-06-12 NOTE — Telephone Encounter (Signed)
Verbal orders relayed to Ileana Roup, RN at [facility]. Cefepime discontinued, vancomycin to continue as previously ordered, end date 4/5 is unchanged. Patient currently following covid isolation dialysis schedule of M/W/F, but will switch back to Tues/Thurs/Sat this weekend. [facility], notified them of the discontinued metronidazole as well. Landis Gandy, RN

## 2020-06-12 NOTE — Addendum Note (Signed)
Addended by: Landis Gandy on: 06/12/2020 03:30 PM   Modules accepted: Orders

## 2020-06-13 DIAGNOSIS — N4829 Other inflammatory disorders of penis: Secondary | ICD-10-CM | POA: Diagnosis not present

## 2020-06-13 DIAGNOSIS — D631 Anemia in chronic kidney disease: Secondary | ICD-10-CM | POA: Diagnosis not present

## 2020-06-13 DIAGNOSIS — N5089 Other specified disorders of the male genital organs: Secondary | ICD-10-CM | POA: Diagnosis not present

## 2020-06-13 DIAGNOSIS — N2581 Secondary hyperparathyroidism of renal origin: Secondary | ICD-10-CM | POA: Diagnosis not present

## 2020-06-13 DIAGNOSIS — U071 COVID-19: Secondary | ICD-10-CM | POA: Diagnosis not present

## 2020-06-13 DIAGNOSIS — N186 End stage renal disease: Secondary | ICD-10-CM | POA: Diagnosis not present

## 2020-06-13 DIAGNOSIS — M86172 Other acute osteomyelitis, left ankle and foot: Secondary | ICD-10-CM | POA: Diagnosis not present

## 2020-06-16 ENCOUNTER — Other Ambulatory Visit: Payer: Self-pay

## 2020-06-16 ENCOUNTER — Ambulatory Visit (INDEPENDENT_AMBULATORY_CARE_PROVIDER_SITE_OTHER): Payer: Medicare Other | Admitting: Podiatry

## 2020-06-16 ENCOUNTER — Other Ambulatory Visit (HOSPITAL_COMMUNITY): Payer: Self-pay | Admitting: Urology

## 2020-06-16 DIAGNOSIS — L97524 Non-pressure chronic ulcer of other part of left foot with necrosis of bone: Secondary | ICD-10-CM

## 2020-06-16 DIAGNOSIS — N2581 Secondary hyperparathyroidism of renal origin: Secondary | ICD-10-CM | POA: Diagnosis not present

## 2020-06-16 DIAGNOSIS — E1165 Type 2 diabetes mellitus with hyperglycemia: Secondary | ICD-10-CM

## 2020-06-16 DIAGNOSIS — E08621 Diabetes mellitus due to underlying condition with foot ulcer: Secondary | ICD-10-CM

## 2020-06-16 DIAGNOSIS — R599 Enlarged lymph nodes, unspecified: Secondary | ICD-10-CM

## 2020-06-16 DIAGNOSIS — L97412 Non-pressure chronic ulcer of right heel and midfoot with fat layer exposed: Secondary | ICD-10-CM

## 2020-06-16 DIAGNOSIS — M86172 Other acute osteomyelitis, left ankle and foot: Secondary | ICD-10-CM | POA: Diagnosis not present

## 2020-06-16 DIAGNOSIS — D631 Anemia in chronic kidney disease: Secondary | ICD-10-CM | POA: Diagnosis not present

## 2020-06-16 DIAGNOSIS — N186 End stage renal disease: Secondary | ICD-10-CM | POA: Diagnosis not present

## 2020-06-16 DIAGNOSIS — U071 COVID-19: Secondary | ICD-10-CM | POA: Diagnosis not present

## 2020-06-17 DIAGNOSIS — M1030 Gout due to renal impairment, unspecified site: Secondary | ICD-10-CM | POA: Diagnosis not present

## 2020-06-17 DIAGNOSIS — Z792 Long term (current) use of antibiotics: Secondary | ICD-10-CM | POA: Diagnosis not present

## 2020-06-17 DIAGNOSIS — Z89021 Acquired absence of right finger(s): Secondary | ICD-10-CM | POA: Diagnosis not present

## 2020-06-17 DIAGNOSIS — I120 Hypertensive chronic kidney disease with stage 5 chronic kidney disease or end stage renal disease: Secondary | ICD-10-CM | POA: Diagnosis not present

## 2020-06-17 DIAGNOSIS — E1151 Type 2 diabetes mellitus with diabetic peripheral angiopathy without gangrene: Secondary | ICD-10-CM | POA: Diagnosis not present

## 2020-06-17 DIAGNOSIS — Z4781 Encounter for orthopedic aftercare following surgical amputation: Secondary | ICD-10-CM | POA: Diagnosis not present

## 2020-06-17 DIAGNOSIS — D631 Anemia in chronic kidney disease: Secondary | ICD-10-CM | POA: Diagnosis not present

## 2020-06-17 DIAGNOSIS — E1142 Type 2 diabetes mellitus with diabetic polyneuropathy: Secondary | ICD-10-CM | POA: Diagnosis not present

## 2020-06-17 DIAGNOSIS — L97429 Non-pressure chronic ulcer of left heel and midfoot with unspecified severity: Secondary | ICD-10-CM | POA: Diagnosis not present

## 2020-06-17 DIAGNOSIS — Z992 Dependence on renal dialysis: Secondary | ICD-10-CM | POA: Diagnosis not present

## 2020-06-17 DIAGNOSIS — M869 Osteomyelitis, unspecified: Secondary | ICD-10-CM | POA: Diagnosis not present

## 2020-06-17 DIAGNOSIS — N2581 Secondary hyperparathyroidism of renal origin: Secondary | ICD-10-CM | POA: Diagnosis not present

## 2020-06-17 DIAGNOSIS — Z89011 Acquired absence of right thumb: Secondary | ICD-10-CM | POA: Diagnosis not present

## 2020-06-17 DIAGNOSIS — Z452 Encounter for adjustment and management of vascular access device: Secondary | ICD-10-CM | POA: Diagnosis not present

## 2020-06-17 DIAGNOSIS — N186 End stage renal disease: Secondary | ICD-10-CM | POA: Diagnosis not present

## 2020-06-17 DIAGNOSIS — Z9641 Presence of insulin pump (external) (internal): Secondary | ICD-10-CM | POA: Diagnosis not present

## 2020-06-17 DIAGNOSIS — E1122 Type 2 diabetes mellitus with diabetic chronic kidney disease: Secondary | ICD-10-CM | POA: Diagnosis not present

## 2020-06-17 DIAGNOSIS — Z7982 Long term (current) use of aspirin: Secondary | ICD-10-CM | POA: Diagnosis not present

## 2020-06-17 DIAGNOSIS — E1169 Type 2 diabetes mellitus with other specified complication: Secondary | ICD-10-CM | POA: Diagnosis not present

## 2020-06-17 DIAGNOSIS — L97419 Non-pressure chronic ulcer of right heel and midfoot with unspecified severity: Secondary | ICD-10-CM | POA: Diagnosis not present

## 2020-06-17 DIAGNOSIS — E11621 Type 2 diabetes mellitus with foot ulcer: Secondary | ICD-10-CM | POA: Diagnosis not present

## 2020-06-17 DIAGNOSIS — Z89022 Acquired absence of left finger(s): Secondary | ICD-10-CM | POA: Diagnosis not present

## 2020-06-17 DIAGNOSIS — E7800 Pure hypercholesterolemia, unspecified: Secondary | ICD-10-CM | POA: Diagnosis not present

## 2020-06-17 DIAGNOSIS — I7300 Raynaud's syndrome without gangrene: Secondary | ICD-10-CM | POA: Diagnosis not present

## 2020-06-17 DIAGNOSIS — Z9181 History of falling: Secondary | ICD-10-CM | POA: Diagnosis not present

## 2020-06-18 DIAGNOSIS — L97419 Non-pressure chronic ulcer of right heel and midfoot with unspecified severity: Secondary | ICD-10-CM | POA: Diagnosis not present

## 2020-06-18 DIAGNOSIS — M869 Osteomyelitis, unspecified: Secondary | ICD-10-CM | POA: Diagnosis not present

## 2020-06-18 DIAGNOSIS — E1169 Type 2 diabetes mellitus with other specified complication: Secondary | ICD-10-CM | POA: Diagnosis not present

## 2020-06-18 DIAGNOSIS — L97429 Non-pressure chronic ulcer of left heel and midfoot with unspecified severity: Secondary | ICD-10-CM | POA: Diagnosis not present

## 2020-06-18 DIAGNOSIS — E11621 Type 2 diabetes mellitus with foot ulcer: Secondary | ICD-10-CM | POA: Diagnosis not present

## 2020-06-18 DIAGNOSIS — Z4781 Encounter for orthopedic aftercare following surgical amputation: Secondary | ICD-10-CM | POA: Diagnosis not present

## 2020-06-19 DIAGNOSIS — Z4781 Encounter for orthopedic aftercare following surgical amputation: Secondary | ICD-10-CM | POA: Diagnosis not present

## 2020-06-19 DIAGNOSIS — N186 End stage renal disease: Secondary | ICD-10-CM | POA: Diagnosis not present

## 2020-06-19 DIAGNOSIS — L97429 Non-pressure chronic ulcer of left heel and midfoot with unspecified severity: Secondary | ICD-10-CM | POA: Diagnosis not present

## 2020-06-19 DIAGNOSIS — M86172 Other acute osteomyelitis, left ankle and foot: Secondary | ICD-10-CM | POA: Diagnosis not present

## 2020-06-19 DIAGNOSIS — E1169 Type 2 diabetes mellitus with other specified complication: Secondary | ICD-10-CM | POA: Diagnosis not present

## 2020-06-19 DIAGNOSIS — E11621 Type 2 diabetes mellitus with foot ulcer: Secondary | ICD-10-CM | POA: Diagnosis not present

## 2020-06-19 DIAGNOSIS — N2581 Secondary hyperparathyroidism of renal origin: Secondary | ICD-10-CM | POA: Diagnosis not present

## 2020-06-19 DIAGNOSIS — D631 Anemia in chronic kidney disease: Secondary | ICD-10-CM | POA: Diagnosis not present

## 2020-06-19 DIAGNOSIS — L97419 Non-pressure chronic ulcer of right heel and midfoot with unspecified severity: Secondary | ICD-10-CM | POA: Diagnosis not present

## 2020-06-19 DIAGNOSIS — Z992 Dependence on renal dialysis: Secondary | ICD-10-CM | POA: Diagnosis not present

## 2020-06-19 DIAGNOSIS — U071 COVID-19: Secondary | ICD-10-CM | POA: Diagnosis not present

## 2020-06-19 DIAGNOSIS — M869 Osteomyelitis, unspecified: Secondary | ICD-10-CM | POA: Diagnosis not present

## 2020-06-19 NOTE — Progress Notes (Signed)
Subjective: [name] [name] is a 55 y.o. is seen today in office s/p bilateral heel wound debridement, graft application preformed on 06/04/2020.  He states his home health nurse came out last week but would not change the bandage.  He does get chronic pain to his feet.  Is on chronic pain medication given his hand issues.  Currently denies any fevers, chills, nausea, vomiting.  No calf pain, chest pain, shortness of breath.  Still on antibiotics per infectious disease.  Objective: General: No acute distress, AAOx3  DP/PT pulses palpable 2/4, CRT < 3 sec to all digits.  Bilateral feet: Grafts are intact with staples.  There is no surrounding erythema, ascending cellulitis there is no drainage or pus identified cover the wound.  Macerated periwound is present.  There is no areas of fluctuation, crepitation, malodor. No pain with calf compression, swelling, warmth, erythema.   Assessment and Plan:  Status post bilateral heel wound debridement, graft application  -Treatment options discussed including all alternatives, risks, and complications -Dressings were changed today.  Remove the staples today and clean the wounds.  Wounds are filling in.  Healing echocardiogram for further debridement versus wound care referral.  I will continue antibiotics per infectious disease for osteomyelitis.  He has previously been evaluated by vascular surgery while in the hospital.  Continue to limit weightbearing.  In a cam boot in the left and surgical shoe on the right.  [name] [name] DPM

## 2020-06-19 NOTE — Progress Notes (Signed)
Sounds good thanks

## 2020-06-20 ENCOUNTER — Telehealth (INDEPENDENT_AMBULATORY_CARE_PROVIDER_SITE_OTHER): Payer: Medicare Other | Admitting: Infectious Diseases

## 2020-06-20 ENCOUNTER — Other Ambulatory Visit: Payer: Self-pay

## 2020-06-20 DIAGNOSIS — E1169 Type 2 diabetes mellitus with other specified complication: Secondary | ICD-10-CM | POA: Diagnosis not present

## 2020-06-20 DIAGNOSIS — M869 Osteomyelitis, unspecified: Secondary | ICD-10-CM | POA: Diagnosis not present

## 2020-06-20 MED ORDER — AMOXICILLIN 500 MG PO CAPS: 500.0000 mg | ORAL_CAPSULE | Freq: Every day | ORAL | 0 refills | Status: AC

## 2020-06-20 NOTE — Progress Notes (Signed)
Virtual Visit via Telephone Note  I connected with@ on 06/20/20 at  4:00 PM EDT by a telephone enabled telemedicine application and verified that I am speaking with the correct person using two identifiers.  Location: Patient:Home  Provider: [facility]   I discussed the limitations of evaluation and management by telemedicine and the availability of in person appointments. The patient expressed understanding and agreed to proceed.  [facility]  Patient Active Problem List   Diagnosis Date Noted  . Osteomyelitis due to type 2 diabetes mellitus ([place]) 05/12/2020  . Diabetic foot ulcer ([place]) 05/12/2020  . HLD (hyperlipidemia) 02/08/2020  . Secondary hyperparathyroidism ([place]) 02/08/2020  . Secondary anemia 02/08/2020  . Retinal detachment 02/08/2020  . Medication monitoring encounter 07/25/2019  . Penile mass 07/16/2019  . BMI 40.0-44.9, adult ([place]) 12/20/2018  . Wound infection 09/15/2018  . 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 Acute GI bleeding 10/13/2017  . ESRD on hemodialysis ([place]) 10/13/2017  . Embolism, arterial ([place]) 10/12/2017  . Ischemia of finger 2017/10/26  . Deceased-donor kidney transplant 06/04/2016  . 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 Diabetes mellitus type 2, uncontrolled ([place]) 05/03/2014  . Non-ischemic cardiomyopathy ([place]) 05/03/2014  . Type II diabetes mellitus with renal manifestations ([place]) 02/01/2011  . Mechanical complication of other vascular device, implant, and graft 02/01/2011  . HYPERCHOLESTEROLEMIA 08/19/2009  . Essential hypertension 08/19/2009  . Chronic systolic heart failure ([place]) 08/19/2009  . CKD (chronic kidney disease) stage V requiring chronic dialysis ([place]) 08/19/2009  . Depression 08/19/2009    Patient's Medications  New Prescriptions   No medications on file  Previous Medications   AMOXICILLIN (AMOXIL) 500 MG CAPSULE    Take 1 capsule (500 mg total) by  mouth daily for 14 days. Take antibiotic after dialysis on dialysis treatment days.   ASPIRIN 325 MG TABLET    Take 325 mg by mouth every evening.   ATORVASTATIN (LIPITOR) 10 MG TABLET    Take 10 mg by mouth every evening.   CALCIUM ACETATE (PHOSLO) 667 MG CAPSULE    Take 1,334 mg by mouth 2 (two) times daily with a meal.   CELECOXIB (CELEBREX) 100 MG CAPSULE    Take 100 mg by mouth daily as needed (pain).   CONTINUOUS BLOOD GLUC SENSOR (FREESTYLE LIBRE 14 DAY SENSOR) MISC    See admin instructions.   CYCLOSPORINE, PF, (CEQUA) 0.09 % SOLN    Place 1 drop into both eyes 2 (two) times daily.   FEBUXOSTAT (ULORIC) 40 MG TABLET    Take 40 mg by mouth every evening.   FERRIC CITRATE (AURYXIA) 1 GM 210 MG(FE) TABLET    Take 420 mg by mouth 2 (two) times daily with a meal.   GABAPENTIN (NEURONTIN) 300 MG CAPSULE    Take 300 mg by mouth 3 (three) times daily.   IBUPROFEN (ADVIL) 200 MG TABLET    Take 600 mg by mouth 3 (three) times daily as needed (pain).   INSULIN DISPOSABLE PUMP (OMNIPOD DASH 5 PACK PODS) MISC    Inject into the skin. Use with Humalog   INSULIN LISPRO (HUMALOG) 100 UNIT/ML INJECTION    Inject 0-3 Units into the skin 3 (three) times daily before meals.   LIDOCAINE (XYLOCAINE) 5 % OINTMENT    Apply 1 application topically 2 (two) times daily as needed (finger pain).   MIDODRINE (PROAMATINE) 5 MG TABLET    Take 5 mg by mouth Every Tuesday,Thursday,and Saturday  with dialysis.   MULTIPLE VITAMIN (MULTIVITAMIN WITH MINERALS) TABS TABLET    Take 1 tablet by mouth every evening.   ONDANSETRON (ZOFRAN) 4 MG TABLET    Take 4 mg by mouth 3 (three) times daily as needed for nausea or vomiting.   SUCROFERRIC OXYHYDROXIDE (VELPHORO) 500 MG CHEWABLE TABLET    Chew 500 mg by mouth See admin instructions. Chew one tablet (500 mg) by mouth twice daily - with lunch and supper   TRIAMCINOLONE CREAM (KENALOG) 0.1 %    Apply 1 application topically daily as needed (itching).   VANCOMYCIN (VANCOCIN) 1-5  GM/200ML-% SOLN    Inject 200 mLs (1,000 mg total) into the vein Every Tuesday,Thursday,and Saturday with dialysis.  Modified Medications   No medications on file  Discontinued Medications   No medications on file    History of Present Illness: 04/26/20 [name] Commons Gregoryis a 55 y.o.malewith PMH significant for failed renal transplant who is ESRD on HD via RT arm AVF, DM 2, gout peripheral neuropathy,  CHF who presented to the ED on 2/21 with worsening pain in his heel ulcers. He was seen by podiatry on day of ED visit,  undergone nail debridement and was advised to to to the ED given DFU with increasing pain and concerns for osteomyelitis. Denies any fevers, chill, sweats. Denies any n/v/d/abdominal pain and denies any GU symptoms. Recent MRI Left heel on 2/16 with > early OM.   He underwent debridement and bone biopsy on 2/23 by Podiatry, Path sent ( pending),. OR cx with no organisms in gram stain and no growth in cx <24 hrs. Podiatry recommending treating as osteo given the depth of the wound. Vascular SX consulted with no concerns of poor blood flow to the Lower extremities. ABI WNL.   Patient was discharged from the hospital on 2/25 with a plan to complete Vancomycin with HD, cefepime and metronidazole for 6 weeks. However, OR cultures 2/23 resulted actinomyces on 3/2. Patient was taken to OR again on  3/16 for debridement of both foot wound and bilateral graft application. Antibiotics was changed to Vancomycin with HD and amoxicillin with discontinuation of cefepime and metronidazole on 3/24.   06/20/20 Doing well, no complaints. Has been getting Vancomycin wiuth HD and taking PO amoxicillin as prescribed. No issues with antibiotics. He feels his wounds are healing well. He is being closely followed by Podiatry. He will follow with me in 2 weeks as in person visit.   ROS 12 point ROS done with pertinent positives and negatives listed above   Past Medical History:  Diagnosis Date  . Anal  infection    posterior anal canal  . Anemia, chronic renal failure   . Atrophic kidney    BILATERAL  . DM type 2 causing ESRD [facility])    Nephrologist-- dr [name] [facility])--  on hemodialysis since June 2012 at  [name]  TTS  . Hemodialysis patient [facility])    at [place] on Tues/ Thur/Sat/schedule  . Hemorrhoids   . Hepatitis B antibody positive   . History of pleural effusion    bilateral  . Hyperparathyroidism, secondary renal ([place])   . Hypertension   . Ischemic cardiomyopathy    per echo 07-01-2014  ef 45%  . LAFB (left anterior fascicular block)   . Peripheral neuropathy   . Systolic and diastolic CHF, chronic ([place])    CARDIOLOGIST-  DR Daneen Schick ([place])  AND DR [name] ([name])  Social History   Tobacco Use  . Smoking status: Never Smoker  . Smokeless tobacco: Never Used  Vaping Use  . Vaping Use: Never used  Substance Use Topics  . Alcohol use: No  . Drug use: No    Family History  Problem Relation Age of Onset  . Diabetes Other     No Known Allergies  Health Maintenance  Topic Date Due  . Hepatitis C Screening  Never done  . COVID-19 Vaccine (1) Never done  . URINE MICROALBUMIN  Never done  . TETANUS/TDAP  Never done  . COLONOSCOPY (Pts 45-60yr Insurance coverage will need to be confirmed)  Never done  . OPHTHALMOLOGY EXAM  09/14/2016  . INFLUENZA VACCINE  10/20/2020  . HEMOGLOBIN A1C  11/10/2020  . FOOT EXAM  02/07/2021  . PNEUMOCOCCAL POLYSACCHARIDE VACCINE AGE 51-64 HIGH RISK  Completed  . HIV Screening  Completed  . HPV VACCINES  Aged Out    Observations/Objective:   Pertinent Pathology  FINAL MICROSCOPIC DIAGNOSIS:  A. SOFT TISSUE, LEFT HEEL, DEBRIDEMENT:  - Soft tissue with necrotizing/ suppurative inflammation   B. BONE, LEFT CALCANEOUS, BIOPSY:  - Fragments of bone and marrow elements    Assessment and Plan: Left heel DFU/osteomyelitis  S/p debridement and bone  biopsy on 2/23. OR cx with Actinomyces  ABI WNL.  Vascular SX consulted with no concerns of poor blood flow to the Lower extremities  Rt heel DFU   DM ESRSD on HD   Continue Vancomycin with HD. End date 06/25/20 Continue Amoxicillin '500mg'$  po q 24 hrs daily. Started on 3/24. Will continue for 2 more weeks and reassess Fu with me in 2 weeks Fu with Podiatry   Follow Up Instructions Follow with me in 2 weeks    I discussed the assessment and treatment plan with the patient. The patient was provided an opportunity to ask questions and all were answered. The patient agreed with the plan and demonstrated an understanding of the instructions.   The patient was advised to call back or seek an in-person evaluation if the symptoms worsen or if the condition fails to improve as anticipated.  I provided 20 minutes of non-face-to-face time during this encounter.  SWilber Oliphant [facility] Phone [phone] no. [phone] 06/20/2020, 1:13 PM

## 2020-06-21 DIAGNOSIS — M86172 Other acute osteomyelitis, left ankle and foot: Secondary | ICD-10-CM | POA: Diagnosis not present

## 2020-06-21 DIAGNOSIS — E785 Hyperlipidemia, unspecified: Secondary | ICD-10-CM | POA: Diagnosis not present

## 2020-06-21 DIAGNOSIS — N186 End stage renal disease: Secondary | ICD-10-CM | POA: Diagnosis not present

## 2020-06-21 DIAGNOSIS — Z23 Encounter for immunization: Secondary | ICD-10-CM | POA: Diagnosis not present

## 2020-06-21 DIAGNOSIS — N2581 Secondary hyperparathyroidism of renal origin: Secondary | ICD-10-CM | POA: Diagnosis not present

## 2020-06-21 DIAGNOSIS — D631 Anemia in chronic kidney disease: Secondary | ICD-10-CM | POA: Diagnosis not present

## 2020-06-23 ENCOUNTER — Ambulatory Visit (INDEPENDENT_AMBULATORY_CARE_PROVIDER_SITE_OTHER): Payer: Medicare Other | Admitting: Podiatry

## 2020-06-23 ENCOUNTER — Other Ambulatory Visit: Payer: Self-pay

## 2020-06-23 DIAGNOSIS — L97411 Non-pressure chronic ulcer of right heel and midfoot limited to breakdown of skin: Secondary | ICD-10-CM | POA: Diagnosis not present

## 2020-06-23 DIAGNOSIS — E1165 Type 2 diabetes mellitus with hyperglycemia: Secondary | ICD-10-CM

## 2020-06-23 DIAGNOSIS — E08621 Diabetes mellitus due to underlying condition with foot ulcer: Secondary | ICD-10-CM | POA: Diagnosis not present

## 2020-06-23 DIAGNOSIS — L97412 Non-pressure chronic ulcer of right heel and midfoot with fat layer exposed: Secondary | ICD-10-CM | POA: Diagnosis not present

## 2020-06-23 MED ORDER — SANTYL 250 UNIT/GM EX OINT: 1.0000 "application " | TOPICAL_OINTMENT | Freq: Every day | CUTANEOUS | 1 refills | Status: DC

## 2020-06-23 NOTE — Patient Instructions (Signed)
[place] and [place] [phone]  [place], [place], [place]

## 2020-06-24 DIAGNOSIS — E785 Hyperlipidemia, unspecified: Secondary | ICD-10-CM | POA: Diagnosis not present

## 2020-06-24 DIAGNOSIS — M86172 Other acute osteomyelitis, left ankle and foot: Secondary | ICD-10-CM | POA: Diagnosis not present

## 2020-06-24 DIAGNOSIS — N2581 Secondary hyperparathyroidism of renal origin: Secondary | ICD-10-CM | POA: Diagnosis not present

## 2020-06-24 DIAGNOSIS — N186 End stage renal disease: Secondary | ICD-10-CM | POA: Diagnosis not present

## 2020-06-24 DIAGNOSIS — D631 Anemia in chronic kidney disease: Secondary | ICD-10-CM | POA: Diagnosis not present

## 2020-06-24 DIAGNOSIS — Z23 Encounter for immunization: Secondary | ICD-10-CM | POA: Diagnosis not present

## 2020-06-25 DIAGNOSIS — E1169 Type 2 diabetes mellitus with other specified complication: Secondary | ICD-10-CM | POA: Diagnosis not present

## 2020-06-25 DIAGNOSIS — G629 Polyneuropathy, unspecified: Secondary | ICD-10-CM | POA: Diagnosis not present

## 2020-06-25 DIAGNOSIS — I7301 Raynaud's syndrome with gangrene: Secondary | ICD-10-CM | POA: Diagnosis not present

## 2020-06-25 DIAGNOSIS — Z79891 Long term (current) use of opiate analgesic: Secondary | ICD-10-CM | POA: Diagnosis not present

## 2020-06-25 DIAGNOSIS — E11621 Type 2 diabetes mellitus with foot ulcer: Secondary | ICD-10-CM | POA: Diagnosis not present

## 2020-06-25 DIAGNOSIS — G894 Chronic pain syndrome: Secondary | ICD-10-CM | POA: Diagnosis not present

## 2020-06-25 DIAGNOSIS — M79609 Pain in unspecified limb: Secondary | ICD-10-CM | POA: Diagnosis not present

## 2020-06-25 DIAGNOSIS — M869 Osteomyelitis, unspecified: Secondary | ICD-10-CM | POA: Diagnosis not present

## 2020-06-25 DIAGNOSIS — Z4781 Encounter for orthopedic aftercare following surgical amputation: Secondary | ICD-10-CM | POA: Diagnosis not present

## 2020-06-25 DIAGNOSIS — L97419 Non-pressure chronic ulcer of right heel and midfoot with unspecified severity: Secondary | ICD-10-CM | POA: Diagnosis not present

## 2020-06-25 DIAGNOSIS — L97429 Non-pressure chronic ulcer of left heel and midfoot with unspecified severity: Secondary | ICD-10-CM | POA: Diagnosis not present

## 2020-06-26 DIAGNOSIS — Z23 Encounter for immunization: Secondary | ICD-10-CM | POA: Diagnosis not present

## 2020-06-26 DIAGNOSIS — M86172 Other acute osteomyelitis, left ankle and foot: Secondary | ICD-10-CM | POA: Diagnosis not present

## 2020-06-26 DIAGNOSIS — E785 Hyperlipidemia, unspecified: Secondary | ICD-10-CM | POA: Diagnosis not present

## 2020-06-26 DIAGNOSIS — N186 End stage renal disease: Secondary | ICD-10-CM | POA: Diagnosis not present

## 2020-06-26 DIAGNOSIS — N2581 Secondary hyperparathyroidism of renal origin: Secondary | ICD-10-CM | POA: Diagnosis not present

## 2020-06-26 DIAGNOSIS — D631 Anemia in chronic kidney disease: Secondary | ICD-10-CM | POA: Diagnosis not present

## 2020-06-26 NOTE — Progress Notes (Signed)
  Subjective:  Patient ID: [name] [name], male    DOB: 1966-03-14,  MRN: [id number]  Chief Complaint  Patient presents with  . Foot Ulcer    Follow up dressing change     55 y.o. male presents with the above complaint. History confirmed with patient.   Objective:  Physical Exam: Pulses palpable, skin is warm and well-perfused, right heel ulceration measures 2.5 cm x 2 cm x 0.1 cm the left heel ulceration measures 6 cm x 4 cm x 0.5 cm, fibrogranular wound base, approximately 80% fibrotic.     Assessment:   1. Uncontrolled type 2 diabetes mellitus with hyperglycemia ([place])   2. Diabetic ulcer of right heel associated with diabetes mellitus due to underlying condition, with fat layer exposed ([place])   3. Diabetic ulcer of right heel associated with diabetes mellitus due to underlying condition, limited to breakdown of skin [facility])      Plan:  Patient was evaluated and treated and all questions answered.  Ulcer bilateral heel -Debridement as below. -Dressed with Iodosorb, DSD. -Continue off-loading with surgical shoe. -I think would be best served with further care at the wound care center for these extensive wounds and referral was sent for this -Recommend use of Santyl which I sent a prescription to his pharmacy and reviewed use and care instructions for this  Procedure: Excisional Debridement of Wound Rationale: Removal of non-viable soft tissue from the wound to promote healing.  Anesthesia: none Post-Debridement Wound Measurements:right heel ulceration measures 2.5 cm x 2 cm x 0.1 cm the left heel ulceration measures 6 cm x 4 cm x 0.5 cm, Type of Debridement: Sharp Excisional Tissue Removed: Non-viable soft tissue Depth of Debridement: subcutaneous tissue. Technique: Sharp excisional debridement to bleeding, viable wound base.  Dressing: Dry, sterile, compression dressing. Disposition: Patient tolerated procedure well.      No follow-ups on file.

## 2020-06-27 DIAGNOSIS — Z4781 Encounter for orthopedic aftercare following surgical amputation: Secondary | ICD-10-CM | POA: Diagnosis not present

## 2020-06-27 DIAGNOSIS — M869 Osteomyelitis, unspecified: Secondary | ICD-10-CM | POA: Diagnosis not present

## 2020-06-27 DIAGNOSIS — E1169 Type 2 diabetes mellitus with other specified complication: Secondary | ICD-10-CM | POA: Diagnosis not present

## 2020-06-27 DIAGNOSIS — L97419 Non-pressure chronic ulcer of right heel and midfoot with unspecified severity: Secondary | ICD-10-CM | POA: Diagnosis not present

## 2020-06-27 DIAGNOSIS — L97429 Non-pressure chronic ulcer of left heel and midfoot with unspecified severity: Secondary | ICD-10-CM | POA: Diagnosis not present

## 2020-06-27 DIAGNOSIS — E11621 Type 2 diabetes mellitus with foot ulcer: Secondary | ICD-10-CM | POA: Diagnosis not present

## 2020-06-28 DIAGNOSIS — N186 End stage renal disease: Secondary | ICD-10-CM | POA: Diagnosis not present

## 2020-06-28 DIAGNOSIS — D631 Anemia in chronic kidney disease: Secondary | ICD-10-CM | POA: Diagnosis not present

## 2020-06-28 DIAGNOSIS — E785 Hyperlipidemia, unspecified: Secondary | ICD-10-CM | POA: Diagnosis not present

## 2020-06-28 DIAGNOSIS — N2581 Secondary hyperparathyroidism of renal origin: Secondary | ICD-10-CM | POA: Diagnosis not present

## 2020-06-28 DIAGNOSIS — Z23 Encounter for immunization: Secondary | ICD-10-CM | POA: Diagnosis not present

## 2020-06-28 DIAGNOSIS — M86172 Other acute osteomyelitis, left ankle and foot: Secondary | ICD-10-CM | POA: Diagnosis not present

## 2020-06-30 ENCOUNTER — Telehealth: Payer: Self-pay | Admitting: Podiatry

## 2020-06-30 DIAGNOSIS — E1169 Type 2 diabetes mellitus with other specified complication: Secondary | ICD-10-CM | POA: Diagnosis not present

## 2020-06-30 DIAGNOSIS — Z4781 Encounter for orthopedic aftercare following surgical amputation: Secondary | ICD-10-CM | POA: Diagnosis not present

## 2020-06-30 DIAGNOSIS — E11621 Type 2 diabetes mellitus with foot ulcer: Secondary | ICD-10-CM | POA: Diagnosis not present

## 2020-06-30 DIAGNOSIS — L97429 Non-pressure chronic ulcer of left heel and midfoot with unspecified severity: Secondary | ICD-10-CM | POA: Diagnosis not present

## 2020-06-30 DIAGNOSIS — M869 Osteomyelitis, unspecified: Secondary | ICD-10-CM | POA: Diagnosis not present

## 2020-06-30 DIAGNOSIS — L97419 Non-pressure chronic ulcer of right heel and midfoot with unspecified severity: Secondary | ICD-10-CM | POA: Diagnosis not present

## 2020-06-30 NOTE — Telephone Encounter (Signed)
Patient returning call from Dr. Elisha Ponder, Although out of the office, patient stated he received a call from Dr. Elisha Ponder today, but missed it; unsure of what was to be discussed. Please advise.

## 2020-06-30 NOTE — Telephone Encounter (Signed)
Not sure what this was but if everything is ok and we initiated it then we can probably wait until his appt with Dr. Sherryle Lis this week

## 2020-07-01 ENCOUNTER — Other Ambulatory Visit: Payer: Self-pay

## 2020-07-01 ENCOUNTER — Encounter (HOSPITAL_BASED_OUTPATIENT_CLINIC_OR_DEPARTMENT_OTHER): Payer: Medicare Other | Attending: Physician Assistant | Admitting: Internal Medicine

## 2020-07-01 DIAGNOSIS — E785 Hyperlipidemia, unspecified: Secondary | ICD-10-CM | POA: Diagnosis not present

## 2020-07-01 DIAGNOSIS — N2581 Secondary hyperparathyroidism of renal origin: Secondary | ICD-10-CM | POA: Diagnosis not present

## 2020-07-01 DIAGNOSIS — L89619 Pressure ulcer of right heel, unspecified stage: Secondary | ICD-10-CM | POA: Diagnosis not present

## 2020-07-01 DIAGNOSIS — M86172 Other acute osteomyelitis, left ankle and foot: Secondary | ICD-10-CM | POA: Diagnosis not present

## 2020-07-01 DIAGNOSIS — I132 Hypertensive heart and chronic kidney disease with heart failure and with stage 5 chronic kidney disease, or end stage renal disease: Secondary | ICD-10-CM | POA: Insufficient documentation

## 2020-07-01 DIAGNOSIS — Z992 Dependence on renal dialysis: Secondary | ICD-10-CM | POA: Insufficient documentation

## 2020-07-01 DIAGNOSIS — I509 Heart failure, unspecified: Secondary | ICD-10-CM | POA: Diagnosis not present

## 2020-07-01 DIAGNOSIS — E1122 Type 2 diabetes mellitus with diabetic chronic kidney disease: Secondary | ICD-10-CM | POA: Diagnosis not present

## 2020-07-01 DIAGNOSIS — Z89029 Acquired absence of unspecified finger(s): Secondary | ICD-10-CM | POA: Diagnosis not present

## 2020-07-01 DIAGNOSIS — N186 End stage renal disease: Secondary | ICD-10-CM | POA: Diagnosis not present

## 2020-07-01 DIAGNOSIS — L89620 Pressure ulcer of left heel, unstageable: Secondary | ICD-10-CM | POA: Insufficient documentation

## 2020-07-01 DIAGNOSIS — Z23 Encounter for immunization: Secondary | ICD-10-CM | POA: Diagnosis not present

## 2020-07-01 DIAGNOSIS — Z794 Long term (current) use of insulin: Secondary | ICD-10-CM | POA: Diagnosis not present

## 2020-07-01 DIAGNOSIS — I7300 Raynaud's syndrome without gangrene: Secondary | ICD-10-CM | POA: Diagnosis not present

## 2020-07-01 DIAGNOSIS — L97929 Non-pressure chronic ulcer of unspecified part of left lower leg with unspecified severity: Secondary | ICD-10-CM | POA: Diagnosis present

## 2020-07-01 DIAGNOSIS — D631 Anemia in chronic kidney disease: Secondary | ICD-10-CM | POA: Diagnosis not present

## 2020-07-01 DIAGNOSIS — E1140 Type 2 diabetes mellitus with diabetic neuropathy, unspecified: Secondary | ICD-10-CM | POA: Diagnosis not present

## 2020-07-01 DIAGNOSIS — I429 Cardiomyopathy, unspecified: Secondary | ICD-10-CM | POA: Insufficient documentation

## 2020-07-02 NOTE — Progress Notes (Signed)
[name] [name], [name] [name] ([id number]) Visit Report for 07/01/2020 Abuse/Suicide Risk Screen Details Patient Name: Date of Service: [name], [place] 07/01/2020 2:45 PM Medical Record Number: [id number] Patient Account Number: [id number] Date of Birth/Sex: Treating RN: 03/23/1965 (55 y.o. [name] [name] Primary Care [name] [name]: [name] [name] Other Clinician: Referring [name] [name]: Treating [name] [name]/Extender: [name] [name] in Treatment: 0 Abuse/Suicide Risk Screen Items Answer ABUSE RISK SCREEN: Has anyone close to you tried to hurt or harm you recentlyo No Do you feel uncomfortable with anyone in your familyo No Has anyone forced you do things that you didnt want to doo No Electronic Signature(s) Signed: 07/02/2020 5:13:29 PM By: [name] Hurst RN, BSN Entered By: [name] [name] on 07/01/2020 16:01:44 -------------------------------------------------------------------------------- Activities of Daily Living Details Patient Name: Date of Service: [name] [name], [place]. 07/01/2020 2:45 PM Medical Record Number: [id number] Patient Account Number: [id number] Date of Birth/Sex: Treating RN: January 07, 1966 (55 y.o. [name] [name] Primary Care [name] [name]: [name] [name] Other Clinician: Referring [name]: Treating [name] [name]/Extender: [name] [name] in Treatment: 0 Activities of Daily Living Items Answer Activities of Daily Living (Please select one for each item) Drive Automobile Not Able T Medications ake Completely Able Use T elephone Completely Able Care for Appearance Completely Able Use T oilet Need Assistance Bath / Shower Need Assistance Dress Self Need Assistance Feed Self Completely Able Walk Need Assistance Get In / Out Bed Need Assistance Housework Need Assistance Prepare Meals Need Assistance Handle Money Need Assistance Shop for Self Need Assistance Electronic Signature(s) Signed: 07/02/2020 5:13:29 PM By: [name] Hurst RN, BSN Entered By: [name] [name] on 07/01/2020 16:02:45 -------------------------------------------------------------------------------- Education Screening Details Patient Name: Date of Service: [name] [name], [name] [name] L. 07/01/2020 2:45 PM Medical Record Number: [id number] Patient Account Number: [id number] Date of Birth/Sex: Treating RN: [birth year] (55 y.o. [name] [name] Primary Care Andalyn Heckstall: [name] [name] Other Clinician: Referring Sotero Brinkmeyer: Treating Shamel Germond/Extender: [name] [name] in Treatment: 0 Primary Learner Assessed: Patient Learning Preferences/Education Level/Primary Language Learning Preference: Explanation, Demonstration, Printed Material Highest Education Level: College or Above Preferred Language: English Cognitive Barrier Language Barrier: No Translator Needed: No Memory Deficit: No Emotional Barrier: No Cultural/Religious Beliefs Affecting Medical Care: No Physical Barrier Impaired Vision: No Impaired Hearing: No Decreased Hand dexterity: No Knowledge/Comprehension Knowledge Level: High Comprehension Level: High Ability to understand written instructions: High Ability to understand verbal instructions: High Motivation Anxiety Level: Calm Cooperation: Cooperative Education Importance: Acknowledges Need Interest in Health Problems: Asks Questions Perception: Coherent Willingness to Engage in Self-Management High Activities: Readiness to Engage in Self-Management High Activities: Electronic Signature(s) Signed: 07/02/2020 5:13:29 PM By: [name] Hurst RN, BSN Entered By: [name] [name] on 07/01/2020 16:03:35 -------------------------------------------------------------------------------- Fall Risk Assessment Details Patient Name: Date of Service: [name] Mocha, [name] L. 07/01/2020 2:45 PM Medical Record Number: [id number] Patient Account Number: [id number] Date of Birth/Sex: Treating RN: January 17, 1966 (55 y.o. [name] [name] Primary Care Deshanda Molitor: [name] [name] Other Clinician: Referring Bettye Sitton: Treating Irvin Bastin/Extender: [name] [name] in Treatment: 0 Fall Risk Assessment Items Have you had 2 or more falls in the last 12 monthso 0 No Have you had any fall that resulted in injury in the last 12 monthso 0 No FALLS RISK SCREEN History of falling - immediate or within 3 months 0 No Secondary diagnosis (Do you have 2 or more medical diagnoseso) 15 Yes Ambulatory aid None/bed rest/wheelchair/nurse 0 No Crutches/cane/walker 15 Yes Furniture 0 No Intravenous therapy Access/Saline/Heparin Lock 0 No Gait/Transferring Normal/ bed rest/ wheelchair 0  No Weak (short steps with or without shuffle, stooped but able to lift head while walking, may seek 10 Yes support from furniture) Impaired (short steps with shuffle, may have difficulty arising from chair, head down, impaired 0 No balance) Mental Status Oriented to own ability 0 Yes Electronic Signature(s) Signed: 07/02/2020 5:13:29 PM By: [name] Hurst RN, BSN Entered By: [name] [name] on 07/01/2020 16:03:54 -------------------------------------------------------------------------------- Foot Assessment Details Patient Name: Date of Service: [name] [name], [name] [name] L. 07/01/2020 2:45 PM Medical Record Number: [id number] Patient Account Number: [id number] Date of Birth/Sex: Treating RN: 03/14/1966 (55 y.o. [name] [name] Primary Care Lennell Shanks: [name] [name] Other Clinician: Referring Brenon Antosh: Treating Shalisha Clausing/Extender: [name] [name] in Treatment: 0 Foot Assessment Items Site Locations + = Sensation present, - = Sensation absent, C = Callus, U = Ulcer R = Redness, W = Warmth, M = Maceration, PU = Pre-ulcerative lesion F = Fissure, S = Swelling, D = Dryness Assessment Right: Left: Other Deformity: No No Prior Foot Ulcer: No No Prior Amputation: No No Charcot Joint: No  No Ambulatory Status: Ambulatory With Help Assistance Device: Cane Gait: Steady Electronic Signature(s) Signed: 07/02/2020 5:13:29 PM By: [name] Hurst RN, BSN Entered By: [name] [name] on 07/01/2020 16:07:13 -------------------------------------------------------------------------------- Nutrition Risk Screening Details Patient Name: Date of Service: [name] [name], [place] [name] L. 07/01/2020 2:45 PM Medical Record Number: [id number] Patient Account Number: [id number] Date of Birth/Sex: Treating RN: September 12, 1965 (55 y.o. [name] [name] Primary Care Angell Pincock: [name] [name] Other Clinician: Referring Younis Mathey: Treating Moiz Ryant/Extender: [name] [name] in Treatment: 0 Height (in): 65 Weight (lbs): 240 Body Mass Index (BMI): 39.9 Nutrition Risk Screening Items Score Screening NUTRITION RISK SCREEN: I have an illness or condition that made me change the kind and/or amount of food I eat 2 Yes I eat fewer than two meals per day 0 No I eat few fruits and vegetables, or milk products 0 No I have three or more drinks of beer, liquor or wine almost every day 0 No I have tooth or mouth problems that make it hard for me to eat 0 No I don't always have enough money to buy the food I need 0 No I eat alone most of the time 0 No I take three or more different prescribed or over-the-counter drugs a day 1 Yes Without wanting to, I have lost or gained 10 pounds in the last six months 0 No I am not always physically able to shop, cook and/or feed myself 2 Yes Nutrition Protocols Good Risk Protocol Moderate Risk Protocol 0 Provide education on nutrition High Risk Proctocol Risk Level: Moderate Risk Score: 5 Electronic Signature(s) Signed: 07/02/2020 5:13:29 PM By: [name] Hurst RN, BSN Entered By: [name] [name] on 07/01/2020 16:04:04

## 2020-07-02 NOTE — Progress Notes (Signed)
[name] [name], [name] [name] ([id number]) Visit Report for 07/01/2020 Chief Complaint Document Details Patient Name: Date of Service: [name] [name], [name] [name] 07/01/2020 2:45 PM Medical Record Number: [id number] Patient Account Number: [id number] Date of Birth/Sex: Treating RN: [birth year] (55 y.o. [name] [name], [name] Primary Care Purvis Sidle: [name] Other Clinician: Referring Izabel Chim: Treating Jameek Bruntz/Extender: [name] in Treatment: 0 Information Obtained from: Patient Chief Complaint Bilateral heel wounds Electronic Signature(s) Signed: 07/02/2020 5:03:07 PM By: Kalman Shan DO Entered By: Kalman Shan on 07/02/2020 16:43:06 -------------------------------------------------------------------------------- Debridement Details Patient Name: Date of Service: [name] [name], [name] Brisbin L. 07/01/2020 2:45 PM Medical Record Number: [id number] Patient Account Number: [id number] Date of Birth/Sex: Treating RN: May 19, 1965 (54 y.o. [name] Primary Care Cristal Howatt: [name] Other Clinician: Referring Dandrea Widdowson: Treating Steen Bisig/Extender: [name] in Treatment: 0 Debridement Performed for Assessment: Wound #3 Left Calcaneus Performed By: Physician Kalman Shan, DO Debridement Type: Debridement Severity of Tissue Pre Debridement: Fat layer exposed Level of Consciousness (Pre-procedure): Awake and Alert Pre-procedure Verification/Time Out Yes - 16:34 Taken: Start Time: 16:34 Pain Control: Other : Benzocaine 20% T Area Debrided (L x W): otal 4.4 (cm) x 5.5 (cm) = 24.2 (cm) Tissue and other material debrided: Viable, Non-Viable, Eschar, Slough, Subcutaneous, Slough Level: Skin/Subcutaneous Tissue Debridement Description: Excisional Instrument: Curette Bleeding: Minimum Hemostasis Achieved: Pressure End Time: 16:36 Procedural Pain: 4 Post Procedural Pain: 2 Response to Treatment: Procedure was tolerated  well Level of Consciousness (Post- Awake and Alert procedure): Post Debridement Measurements of Total Wound Length: (cm) 4.4 Width: (cm) 5.5 Depth: (cm) 0.9 Volume: (cm) 17.106 Character of Wound/Ulcer Post Debridement: Requires Further Debridement Severity of Tissue Post Debridement: Fat layer exposed Post Procedure Diagnosis Same as Pre-procedure Electronic Signature(s) Signed: 07/02/2020 5:03:07 PM By: Kalman Shan DO Signed: 07/02/2020 5:13:29 PM By: Levan Hurst RN, BSN Entered By: Levan Hurst on 07/01/2020 16:35:27 -------------------------------------------------------------------------------- Debridement Details Patient Name: Date of Service: [name] Mocha, [name] [name] 07/01/2020 2:45 PM Medical Record Number: [id number] Patient Account Number: [id number] Date of Birth/Sex: Treating RN: 1966-03-11 (55 y.o. [name] Primary Care Yazmina Pareja: [name] Other Clinician: Referring Lexington Devine: Treating Kree Rafter/Extender: [name] in Treatment: 0 Debridement Performed for Assessment: Wound #2 Right Calcaneus Performed By: Physician Kalman Shan, DO Debridement Type: Debridement Severity of Tissue Pre Debridement: Fat layer exposed Level of Consciousness (Pre-procedure): Awake and Alert Pre-procedure Verification/Time Out Yes - 16:34 Taken: Start Time: 16:34 Pain Control: Other : Benzocaine 20% T Area Debrided (L x W): otal 5 (cm) x 2.7 (cm) = 13.5 (cm) Tissue and other material debrided: Viable, Non-Viable, Eschar, Slough, Subcutaneous, Slough Level: Skin/Subcutaneous Tissue Debridement Description: Excisional Instrument: Curette Bleeding: Minimum Hemostasis Achieved: Pressure End Time: 16:36 Procedural Pain: 4 Post Procedural Pain: 2 Response to Treatment: Procedure was tolerated well Level of Consciousness (Post- Awake and Alert procedure): Post Debridement Measurements of Total Wound Length: (cm) 5 Width:  (cm) 2.7 Depth: (cm) 0.2 Volume: (cm) 2.121 Character of Wound/Ulcer Post Debridement: Requires Further Debridement Severity of Tissue Post Debridement: Fat layer exposed Post Procedure Diagnosis Same as Pre-procedure Electronic Signature(s) Signed: 07/02/2020 5:03:07 PM By: Kalman Shan DO Signed: 07/02/2020 5:13:29 PM By: Levan Hurst RN, BSN Entered By: Levan Hurst on 07/01/2020 17:38:29 -------------------------------------------------------------------------------- HPI Details Patient Name: Date of Service: [name] Mocha, [name] [name] 07/01/2020 2:45 PM Medical Record Number: [id number] Patient Account Number: [id number] Date of Birth/Sex: Treating RN: Jul 08, 1965 (55 y.o. [name] [name] Primary Care Martita Brumm: [name] Other Clinician: Referring Willetta York: Treating  Mikhala Kenan/Extender: [name] in Treatment: 0 History of Present Illness HPI Description: 4/13 - [name] [name] is a 55 year old male with past medical history of insulin-dependent type 2 diabetes, end-stage renal disease on hemodialysis, Raynaud's disease with bilateral hand and fingertip ulcers and essential hypertension that presents with bilateral heel ulcers. This has been an ongoing issue for the patient and has been following closely with podiatry. He had debridement and graft application preformed on 06/04/2020 due to worsening of the wounds. Imaging suggested osteo and he was treated with vancomycin and amoxicillin. Today he presents for further evaluation of the wounds. He is currently using Santyl on these areas daily. He uses a cam boot on the left and a surgical shoe on the right to help with offloading. He denies any acute pain, fever/chills, increased warmth or erythema to the feet or purulent drainage. Electronic Signature(s) Signed: 07/02/2020 5:03:07 PM By: Kalman Shan DO Entered By: Kalman Shan on 07/02/2020  16:50:29 -------------------------------------------------------------------------------- Physical Exam Details Patient Name: Date of Service: [name] Mocha, [name] [name] 07/01/2020 2:45 PM Medical Record Number: [id number] Patient Account Number: [id number] Date of Birth/Sex: Treating RN: [birth year] (55 y.o. [name] [name] Primary Care Peighton Mehra: [name] Other Clinician: Referring Markie Heffernan: Treating Marv Alfrey/Extender: [name] in Treatment: 0 Constitutional respirations regular, non-labored and within target range for patient.. Cardiovascular 2+ dorsalis pedis/posterior tibialis pulses. Psychiatric [name] and cooperative. Notes Bilateral heels: Both heels present with sloughing throughout the wound bed. There is some granulation tissue noted on the outer rim. There is a lot of fibrinous tissue that was difficult to debride in office. No signs of infection. Electronic Signature(s) Signed: 07/02/2020 5:03:07 PM By: Kalman Shan DO Entered By: Kalman Shan on 07/02/2020 16:58:38 -------------------------------------------------------------------------------- Physician Orders Details Patient Name: Date of Service: [name] [name], [place] [name] 07/01/2020 2:45 PM Medical Record Number: [id number] Patient Account Number: [id number] Date of Birth/Sex: Treating RN: [birth year] (55 y.o. [name] Primary Care Cattaleya Wien: [name] Other Clinician: Referring Jarelly Rinck: Treating Yianna Tersigni/Extender: [name] in Treatment: 0 Verbal / Phone Orders: No Diagnosis Coding ICD-10 Coding Code Description L89.619 Pressure ulcer of right heel, unspecified stage L89.620 Pressure ulcer of left heel, unstageable E11.621 Type 2 diabetes mellitus with foot ulcer N18.6 End stage renal disease Follow-up Appointments ppointment in 1 week. - with Dr. Heber Tolley Return A Bathing/ Shower/ Hygiene May shower and wash wound  with soap and water. Edema Control - Lymphedema / SCD / Other Elevate legs to the level of the heart or above for 30 minutes daily and/or when sitting, a frequency of: - throughout the day Avoid standing for long periods of time. Exercise regularly Off-Loading Open toe surgical shoe to: - right foot Removable cast walker boot to: - left foot [facility] wound care orders this week; continue Home Health for wound care. May utilize formulary equivalent dressing for wound treatment orders unless otherwise specified. - SN to see pt 2-3x per week for wound care, cg to change dressing all other days Other Home Health Orders/Instructions: - [facility] Wound Treatment Wound #2 - Calcaneus Wound Laterality: Right Cleanser: Wound Cleanser ([place]) 1 x Per Day/7 Days Discharge Instructions: Cleanse the wound with wound cleanser or normal saline prior to applying a clean dressing using gauze sponges, not tissue or cotton balls. Prim Dressing: Santyl Ointment (Home Health) 1 x Per Day/7 Days ary Discharge Instructions: Apply nickel thick amount to wound bed as instructed Secondary Dressing: Woven Gauze Sponge, Non-Sterile 4x4  in [facility]) 1 x Per Day/7 Days Discharge Instructions: Apply over primary dressing as directed. Secondary Dressing: ALLEVYN Heel 4 1/2in x 5 1/2in / 10.5cm x 13.5cm (Home Health) 1 x Per Day/7 Days Discharge Instructions: Apply over primary dressing as directed. Secured With: [employer], 4.5x3.1 (in/yd) (Home Health) 1 x Per Day/7 Days Discharge Instructions: Secure with Kerlix as directed. Secured With: Paper Tape, 2x10 (in/yd) (Home Health) 1 x Per Day/7 Days Discharge Instructions: Secure dressing with tape as directed. Wound #3 - Calcaneus Wound Laterality: Left Cleanser: Wound Cleanser (Home Health) 1 x Per Day/7 Days Discharge Instructions: Cleanse the wound with wound cleanser or normal saline prior to applying a clean dressing using gauze sponges, not  tissue or cotton balls. Prim Dressing: Santyl Ointment (Home Health) 1 x Per Day/7 Days ary Discharge Instructions: Apply nickel thick amount to wound bed as instructed Secondary Dressing: Woven Gauze Sponge, Non-Sterile 4x4 in (Home Health) 1 x Per Day/7 Days Discharge Instructions: Apply over primary dressing as directed. Secondary Dressing: ALLEVYN Heel 4 1/2in x 5 1/2in / 10.5cm x 13.5cm (Home Health) 1 x Per Day/7 Days Discharge Instructions: Apply over primary dressing as directed. Secured With: [employer], 4.5x3.1 (in/yd) (Home Health) 1 x Per Day/7 Days Discharge Instructions: Secure with Kerlix as directed. Secured With: Paper Tape, 2x10 (in/yd) (Home Health) 1 x Per Day/7 Days Discharge Instructions: Secure dressing with tape as directed. Electronic Signature(s) Signed: 07/02/2020 5:03:07 PM By: Kalman Shan DO Entered By: Kalman Shan on 07/02/2020 16:59:02 -------------------------------------------------------------------------------- Problem List Details Patient Name: Date of Service: [name] [name], [place] [name] 07/01/2020 2:45 PM Medical Record Number: [id number] Patient Account Number: [id number] Date of Birth/Sex: Treating RN: Jan 14, 1966 (55 y.o. [name] [name], [name] Primary Care Jamarie Joplin: [name] Other Clinician: Referring Larren Copes: Treating Donnielle Addison/Extender: [name] in Treatment: 0 Active Problems ICD-10 Encounter Code Description Active Date MDM Diagnosis L89.619 Pressure ulcer of right heel, unspecified stage 07/02/2020 No Yes L89.620 Pressure ulcer of left heel, unstageable 07/02/2020 No Yes E11.621 Type 2 diabetes mellitus with foot ulcer 07/02/2020 No Yes N18.6 End stage renal disease 07/02/2020 No Yes Inactive Problems Resolved Problems Electronic Signature(s) Signed: 07/02/2020 5:03:07 PM By: Kalman Shan DO Entered By: Kalman Shan on 07/02/2020  16:42:34 -------------------------------------------------------------------------------- Progress Note Details Patient Name: Date of Service: [name] [name], [name] [place] [name] 07/01/2020 2:45 PM Medical Record Number: [id number] Patient Account Number: [id number] Date of Birth/Sex: Treating RN: 10-Feb-1966 (55 y.o. [name] [name], [name] Primary Care Sosaia Pittinger: [name] Other Clinician: Referring Josafat Enrico: Treating Leetta Hendriks/Extender: [name] in Treatment: 0 Subjective Chief Complaint Information obtained from Patient Bilateral heel wounds History of Present Illness (HPI) 4/13 - [name] [name] is a 55 year old male with past medical history of insulin-dependent type 2 diabetes, end-stage renal disease on hemodialysis, Raynaud's disease with bilateral hand and fingertip ulcers and essential hypertension that presents with bilateral heel ulcers. This has been an ongoing issue for the patient and has been following closely with podiatry. He had debridement and graft application preformed on 06/04/2020 due to worsening of the wounds. Imaging suggested osteo and he was treated with vancomycin and amoxicillin. Today he presents for further evaluation of the wounds. He is currently using Santyl on these areas daily. He uses a cam boot on the left and a surgical shoe on the right to help with offloading. He denies any acute pain, fever/chills, increased warmth or erythema to the feet or purulent drainage. Patient History Information obtained from Patient. Allergies  No Known Drug Allergies Family History Unknown History. Social History Never smoker, Marital Status - Single, Alcohol Use - Rarely, Drug Use - No History, Caffeine Use - Rarely. 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 Eyes Denies complaints or symptoms of Dry Eyes, Vision Changes, Glasses / Contacts. Ear/Nose/Mouth/Throat Denies complaints or symptoms of Chronic sinus problems or rhinitis. Respiratory Denies complaints or symptoms of Chronic or frequent coughs, Shortness of Breath. Gastrointestinal Denies complaints or symptoms of Frequent diarrhea, Nausea, Vomiting. Integumentary (Skin) Complains or has symptoms of Wounds - bilateral heel wounds. Musculoskeletal Denies complaints or symptoms of Muscle Pain, Muscle Weakness. Psychiatric Denies complaints or symptoms of Claustrophobia, Suicidal. Objective Constitutional respirations regular, non-labored and within target range for patient.. Vitals Time Taken: 3:32 PM, Height: 65 in, Source: Stated, Weight: 240 lbs, Source: Stated, BMI: 39.9, Temperature: 98.1 F, Pulse: 105 bpm, Respiratory Rate: 16 breaths/min, Blood Pressure: 127/84 mmHg, Capillary Blood Glucose: 240 mg/dl. General Notes: glucose per pt report Cardiovascular 2+ dorsalis pedis/posterior tibialis pulses. Psychiatric [name] and cooperative. General Notes: Bilateral heels: Both heels present with sloughing throughout the wound bed. There is some granulation tissue noted on the outer rim. There is a lot of fibrinous tissue that was difficult to debride in office. No signs of infection. Integumentary (Hair, Skin) Wound #2 status is Open. Original cause of wound was Pressure Injury. The date acquired was: 04/22/2020. The wound is located on  the Right Calcaneus. The wound measures 5cm length x 2.7cm width x 0.2cm depth; 10.603cm^2 area and 2.121cm^3 volume. There is Fat Layer (Subcutaneous Tissue) exposed. There is no tunneling or undermining noted. There is a medium amount of serosanguineous drainage noted. The wound margin is flat and intact. There is small (1-33%) pink granulation within the wound bed. There is a large (67-100%) amount of necrotic tissue within the wound bed including Eschar and Adherent Slough. Wound #3 status is Open. Original cause of wound was Pressure Injury. The date acquired was: 04/22/2020. The wound is located on the Left Calcaneus. The wound measures 4.4cm length x 5.5cm width x 0.9cm depth; 19.007cm^2 area and 17.106cm^3 volume. There is Fat Layer (Subcutaneous Tissue) exposed. There is no tunneling or undermining noted. There is a medium amount of serosanguineous drainage noted. The wound margin is flat and intact. There is small (1-33%) pink granulation within the wound bed. There is a large (67-100%) amount of necrotic tissue within the wound bed including Eschar and Adherent Slough. Assessment Active Problems ICD-10 Pressure ulcer of right heel, unspecified stage Pressure ulcer of left heel, unstageable Type 2 diabetes mellitus with foot ulcer End stage renal disease Discussed with the patient the importance of offloading his heels. He is currently using a cam boot and surgical shoe to help with this. He is currently using Santyl on the wound and I recommended he continue to do this daily. We will see him back in 1 week. Hopefully I can debride more of the necrotic tissue as it was difficult today. Procedures Wound #2 Pre-procedure diagnosis of Wound #2 is a Diabetic Wound/Ulcer  of the Lower Extremity located on the Right Calcaneus .Severity of Tissue Pre Debridement is: Fat layer exposed. There was a Excisional Skin/Subcutaneous Tissue Debridement with a total area of 13.5 sq cm performed by  Kalman Shan, DO. With the following instrument(s): Curette to remove Viable and Non-Viable tissue/material. Material removed includes Eschar, Subcutaneous Tissue, and Slough after achieving pain control using Other (Benzocaine 20%). No specimens were taken. A time out was conducted at 16:34, prior to the start of the procedure. A Minimum amount of bleeding was controlled with Pressure. The procedure was tolerated well with a pain level of 4 throughout and a pain level of 2 following the procedure. Post Debridement Measurements: 5cm length x 2.7cm width x 0.2cm depth; 2.121cm^3 volume. Character of Wound/Ulcer Post Debridement requires further debridement. Severity of Tissue Post Debridement is: Fat layer exposed. Post procedure Diagnosis Wound #2: Same as Pre-Procedure Wound #3 Pre-procedure diagnosis of Wound #3 is a Diabetic Wound/Ulcer of the Lower Extremity located on the Left Calcaneus .Severity of Tissue Pre Debridement is: Fat layer exposed. There was a Excisional Skin/Subcutaneous Tissue Debridement with a total area of 24.2 sq cm performed by Kalman Shan, DO. With the following instrument(s): Curette to remove Viable and Non-Viable tissue/material. Material removed includes Eschar, Subcutaneous Tissue, and Slough after achieving pain control using Other (Benzocaine 20%). No specimens were taken. A time out was conducted at 16:34, prior to the start of the procedure. A Minimum amount of bleeding was controlled with Pressure. The procedure was tolerated well with a pain level of 4 throughout and a pain level of 2 following the procedure. Post Debridement Measurements: 4.4cm length x 5.5cm width x 0.9cm depth; 17.106cm^3 volume. Character of Wound/Ulcer Post Debridement requires further debridement. Severity of Tissue Post Debridement is: Fat layer exposed. Post procedure Diagnosis Wound #3: Same as Pre-Procedure Plan Follow-up Appointments: Return Appointment in 1 week. - with Dr.  Heber Clayton Bathing/ Shower/ Hygiene: May shower and wash wound with soap and water. Edema Control - Lymphedema / SCD / Other: Elevate legs to the level of the heart or above for 30 minutes daily and/or when sitting, a frequency of: - throughout the day Avoid standing for long periods of time. Exercise regularly Off-Loading: Open toe surgical shoe to: - right foot Removable cast walker boot to: - left foot Home Health: [name] wound care orders this week; continue Home Health for wound care. May utilize formulary equivalent dressing for wound treatment orders unless otherwise specified. - SN to see pt 2-3x per week for wound care, cg to change dressing all other days Other Home Health Orders/Instructions: [name] [name] WOUND #2: - Calcaneus Wound Laterality: Right Cleanser: Wound Cleanser ([employer]) 1 x Per Day/7 Days Discharge Instructions: Cleanse the wound with wound cleanser or normal saline prior to applying a clean dressing using gauze sponges, not tissue or cotton balls. Prim Dressing: Santyl Ointment (Home Health) 1 x Per Day/7 Days ary Discharge Instructions: Apply nickel thick amount to wound bed as instructed Secondary Dressing: Woven Gauze Sponge, Non-Sterile 4x4 in (Home Health) 1 x Per Day/7 Days Discharge Instructions: Apply over primary dressing as directed. Secondary Dressing: ALLEVYN Heel 4 1/2in x 5 1/2in / 10.5cm x 13.5cm (Home Health) 1 x Per Day/7 Days Discharge Instructions: Apply over primary dressing as directed. Secured With: [employer], 4.5x3.1 (in/yd) (Home Health) 1 x Per Day/7 Days Discharge Instructions: Secure with Kerlix as directed. Secured With: Paper T ape, 2x10 (in/yd) (Home Health) 1 x Per Day/7 Days Discharge Instructions:  Secure dressing with tape as directed. WOUND #3: - Calcaneus Wound Laterality: Left Cleanser: Wound Cleanser (Home Health) 1 x Per Day/7 Days Discharge Instructions: Cleanse the wound with wound cleanser or normal saline prior to  applying a clean dressing using gauze sponges, not tissue or cotton balls. Prim Dressing: Santyl Ointment (Home Health) 1 x Per Day/7 Days ary Discharge Instructions: Apply nickel thick amount to wound bed as instructed Secondary Dressing: Woven Gauze Sponge, Non-Sterile 4x4 in (Home Health) 1 x Per Day/7 Days Discharge Instructions: Apply over primary dressing as directed. Secondary Dressing: ALLEVYN Heel 4 1/2in x 5 1/2in / 10.5cm x 13.5cm (Home Health) 1 x Per Day/7 Days Discharge Instructions: Apply over primary dressing as directed. Secured With: [employer], 4.5x3.1 (in/yd) (Home Health) 1 x Per Day/7 Days Discharge Instructions: Secure with Kerlix as directed. Secured With: Paper T ape, 2x10 (in/yd) (Home Health) 1 x Per Day/7 Days Discharge Instructions: Secure dressing with tape as directed. 1. Santyl daily with dressing changes 2. Offloading with cam boot and surgical shoe 3. Follow-up in 1 week Electronic Signature(s) Signed: 07/02/2020 5:03:07 PM By: Kalman Shan DO Entered By: Kalman Shan on 07/02/2020 17:01:23 -------------------------------------------------------------------------------- HxROS Details Patient Name: Date of Service: [name] [name], [place] [name] 07/01/2020 2:45 PM Medical Record Number: [id number] Patient Account Number: [id number] Date of Birth/Sex: Treating RN: [birth year] (55 y.o. [name] Primary Care Tyrus Wilms: [name] Other Clinician: Referring Alene Bergerson: Treating Dafina Suk/Extender: [name] in Treatment: 0 Information Obtained From Patient Constitutional Symptoms (General Health) Complaints and Symptoms: Negative for: Fatigue; Fever; Chills; Marked Weight Change Eyes Complaints and Symptoms: Negative for: Dry Eyes; Vision Changes; Glasses / Contacts Ear/Nose/Mouth/Throat Complaints and Symptoms: Negative for: Chronic sinus problems or rhinitis Respiratory Complaints and  Symptoms: Negative for: Chronic or frequent coughs; Shortness of Breath Gastrointestinal Complaints and Symptoms: Negative for: Frequent diarrhea; Nausea; Vomiting Integumentary (Skin) Complaints and Symptoms: Positive for: Wounds - bilateral heel wounds Musculoskeletal Complaints and Symptoms: Negative for: Muscle Pain; Muscle Weakness Psychiatric Complaints and Symptoms: Negative for: Claustrophobia; Suicidal Hematologic/Lymphatic Medical History: Positive for: Anemia Cardiovascular Medical History: Positive for: Congestive Heart Failure; Hypertension Past Medical History Notes: Cardiomyopathy, arterial clots Endocrine Medical History: Positive for: Type II Diabetes Treated with: Insulin Blood sugar tested every day: Yes Tested : 2-3x a day Genitourinary Medical History: Positive for: End Stage Renal Disease Past Medical History Notes: Hemodiaylsis Immunological Medical History: Positive for: Raynauds Past Medical History Notes: 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 Neurologic Medical History: Positive for: Neuropathy Oncologic Immunizations Pneumococcal Vaccine: Received Pneumococcal Vaccination: Yes Implantable Devices None Family and Social History Unknown History: Yes; Never smoker; Marital Status - Single; Alcohol Use: Rarely; Drug Use: No History; Caffeine Use: Rarely; Financial Concerns: No; Food, Clothing or Shelter Needs: No; Support System Lacking: No; Transportation Concerns: No Electronic Signature(s) Signed: 07/02/2020 5:03:07 PM By: Kalman Shan DO Signed: 07/02/2020 5:13:29 PM By: Levan Hurst RN, BSN Entered By: Levan Hurst on 07/01/2020 16:01:28 -------------------------------------------------------------------------------- SuperBill Details Patient Name: Date of Service: [name] [name], [name] [place] L. 07/01/2020 Medical Record Number: [id number] Patient Account Number: [id number] Date of Birth/Sex: Treating RN: 15-Oct-1965 (55 y.o. 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, unstageable E11.621 Type 2 diabetes mellitus with foot ulcer N18.6 End stage renal disease Facility Procedures CPT4 Code:  [id number] Description: 99213 - WOUND CARE VISIT-LEV 3 EST PT Modifier: 25 Quantity: 1 CPT4 Code: [id number] Description: [id number] - DEBRIDE WOUND 1ST 20 SQ CM OR < ICD-10 Diagnosis Description L89.619 Pressure ulcer of right heel, unspecified stage Modifier: Quantity: 1 CPT4 Code: [id number] Description: [id number] - DEBRIDE WOUND 1ST 20 SQ CM OR < ICD-10 Diagnosis Description L89.620 Pressure ulcer of left heel, unstageable Modifier: Quantity: 1 Physician Procedures : CPT4 Code Description Modifier [id number] - WC PHYS DEBR WO ANESTH 20 SQ CM ICD-10 Diagnosis Description L89.619 Pressure ulcer of right heel, unspecified stage Quantity: 1 : [id number] - WC PHYS DEBR WO ANESTH 20 SQ CM ICD-10 Diagnosis Description L89.620 Pressure ulcer of left heel, unstageable Quantity: 1 Electronic Signature(s) Signed: 07/02/2020 5:03:07 PM By: Kalman Shan DO Entered By: Kalman Shan on 07/02/2020 17:02:33

## 2020-07-03 ENCOUNTER — Other Ambulatory Visit: Payer: Self-pay

## 2020-07-03 ENCOUNTER — Inpatient Hospital Stay: Payer: Medicare Other | Admitting: Infectious Diseases

## 2020-07-03 ENCOUNTER — Ambulatory Visit (INDEPENDENT_AMBULATORY_CARE_PROVIDER_SITE_OTHER): Payer: Medicare Other | Admitting: Podiatry

## 2020-07-03 DIAGNOSIS — E1165 Type 2 diabetes mellitus with hyperglycemia: Secondary | ICD-10-CM

## 2020-07-03 DIAGNOSIS — E08621 Diabetes mellitus due to underlying condition with foot ulcer: Secondary | ICD-10-CM

## 2020-07-03 DIAGNOSIS — Z23 Encounter for immunization: Secondary | ICD-10-CM | POA: Diagnosis not present

## 2020-07-03 DIAGNOSIS — E785 Hyperlipidemia, unspecified: Secondary | ICD-10-CM | POA: Diagnosis not present

## 2020-07-03 DIAGNOSIS — L97412 Non-pressure chronic ulcer of right heel and midfoot with fat layer exposed: Secondary | ICD-10-CM

## 2020-07-03 DIAGNOSIS — N2581 Secondary hyperparathyroidism of renal origin: Secondary | ICD-10-CM | POA: Diagnosis not present

## 2020-07-03 DIAGNOSIS — N186 End stage renal disease: Secondary | ICD-10-CM | POA: Diagnosis not present

## 2020-07-03 DIAGNOSIS — L97411 Non-pressure chronic ulcer of right heel and midfoot limited to breakdown of skin: Secondary | ICD-10-CM

## 2020-07-03 DIAGNOSIS — D631 Anemia in chronic kidney disease: Secondary | ICD-10-CM | POA: Diagnosis not present

## 2020-07-03 DIAGNOSIS — M86172 Other acute osteomyelitis, left ankle and foot: Secondary | ICD-10-CM | POA: Diagnosis not present

## 2020-07-03 NOTE — Progress Notes (Signed)
[name] [name], [name] [name] ([id number]) Visit Report for 07/01/2020 Allergy List Details Patient Name: Date of Service: [place], [name] 07/01/2020 2:45 PM Medical Record Number: [id number] Patient Account Number: [id number] Date of Birth/Sex: Treating RN: 1966/03/06 (55 y.o. [name] [name] Primary Care [name] [name]: [name] [name] Other Clinician: Referring [name] [name]: Treating [name] [name] in Treatment: 0 Allergies Active Allergies No Known Drug Allergies Allergy Notes Electronic Signature(s) Signed: 07/02/2020 5:13:29 PM By: [name] [name] Entered By: [name] [name] on 07/01/2020 15:35:44 -------------------------------------------------------------------------------- Arrival Information Details Patient Name: Date of Service: [name] [name], [name] 07/01/2020 2:45 PM Medical Record Number: [id number] Patient Account Number: [id number] Date of Birth/Sex: Treating RN: 30-Jun-1965 (55 y.o. [name] [name] Primary Care [name] [name]: [name] [name] Other Clinician: Referring [name] [name]: Treating [name] [name] in Treatment: 0 Visit Information Patient Arrived: Wheel Chair Arrival Time: 15:32 Accompanied By: mother Transfer Assistance: None Patient Identification Verified: Yes Secondary Verification Process Completed: Yes Patient Has Alerts: Yes Patient Alerts: R TBI: 0.56 L TBI: 0.75 History Since Last Visit Added or deleted any medications: No Any new allergies or adverse reactions: No Had a fall or experienced change in activities of daily living that may affect risk of falls: No Signs or symptoms of abuse/neglect since last visito No Hospitalized since last visit: No Implantable device outside of the clinic excluding cellular tissue based products placed in the center since last visit: No Pain Present Now: No Electronic Signature(s) Signed: 07/02/2020 5:13:29 PM By: [name] Hurst  RN, [name] Entered By: [name] [name] on 07/01/2020 15:33:54 -------------------------------------------------------------------------------- Clinic Level of Care Assessment Details Patient Name: Date of Service: [name] [name], [name] 07/01/2020 2:45 PM Medical Record Number: [id number] Patient Account Number: [id number] Date of Birth/Sex: Treating RN: [birth year] (55 y.o. [name] [name] Primary Care Chantal Worthey: [name] [name] Other Clinician: Referring [name] [name]: Treating [name] [name] in Treatment: 0 Clinic Level of Care Assessment Items TOOL 1 Quantity Score X- 1 0 Use when EandM and Procedure is performed on INITIAL visit ASSESSMENTS - Nursing Assessment / Reassessment X- 1 20 General Physical Exam (combine w/ comprehensive assessment (listed just below) when performed on new pt. evals) X- 1 25 Comprehensive Assessment (HX, ROS, Risk Assessments, Wounds Hx, etc.) ASSESSMENTS - Wound and Skin Assessment / Reassessment '[]'$  - 0 Dermatologic / Skin Assessment (not related to wound area) ASSESSMENTS - Ostomy and/or Continence Assessment and Care '[]'$  - 0 Incontinence Assessment and Management '[]'$  - 0 Ostomy Care Assessment and Management (repouching, etc.) PROCESS - Coordination of Care X - Simple Patient / Family Education for ongoing care 1 15 '[]'$  - 0 Complex (extensive) Patient / Family Education for ongoing care X- 1 10 Staff obtains Programmer, systems, Records, T Results / Process Orders est X- 1 10 Staff telephones HHA, Nursing Homes / Clarify orders / etc '[]'$  - 0 Routine Transfer to another Facility (non-emergent condition) '[]'$  - 0 Routine Hospital Admission (non-emergent condition) X- 1 15 New Admissions / Biomedical engineer / Ordering NPWT Apligraf, etc. , '[]'$  - 0 Emergency Hospital Admission (emergent condition) PROCESS - Special Needs '[]'$  - 0 Pediatric / Minor Patient Management '[]'$  - 0 Isolation Patient Management '[]'$  -  0 Hearing / Language / Visual special needs '[]'$  - 0 Assessment of Community assistance (transportation, D/C planning, etc.) '[]'$  - 0 Additional assistance / Altered mentation '[]'$  - 0 Support Surface(s) Assessment (bed, cushion, seat, etc.) INTERVENTIONS - Miscellaneous '[]'$  - 0 External ear exam '[]'$  - 0 Patient  Transfer (multiple staff / Civil Service fast streamer / Similar devices) '[]'$  - 0 Simple Staple / Suture removal (25 or less) '[]'$  - 0 Complex Staple / Suture removal (26 or more) '[]'$  - 0 Hypo/Hyperglycemic Management (do not check if billed separately) '[]'$  - 0 Ankle / Brachial Index (ABI) - do not check if billed separately Has the patient been seen at the hospital within the last three years: Yes Total Score: 95 Level Of Care: New/Established - Level 3 Electronic Signature(s) Signed: 07/02/2020 5:13:29 PM By: [name] [name] Entered By: [name] [name] on 07/01/2020 17:37:39 -------------------------------------------------------------------------------- Lower Extremity Assessment Details Patient Name: Date of Service: [name] [name], [name] 07/01/2020 2:45 PM Medical Record Number: [id number] Patient Account Number: [id number] Date of Birth/Sex: Treating RN: 05-09-1965 (55 y.o. [name] [name] Primary Care [name] [name]: [name] [name] Other Clinician: Referring [name] [name]: Treating [name] [name] in Treatment: 0 Edema Assessment Assessed: [name] [name]: No] [name] [name]: No] Edema: [Left: Yes] [Right: Yes] Calf Left: Right: Point of Measurement: 34 cm From Medial Instep 42 cm 42 cm Ankle Left: Right: Point of Measurement: 12 cm From Medial Instep 24 cm 23.8 cm Vascular Assessment Pulses: Dorsalis Pedis Palpable: [Left:Yes] [Right:Yes] Electronic Signature(s) Signed: 07/02/2020 5:13:29 PM By: [name] [name] Entered By: [name] [name] on 07/01/2020  16:08:10 -------------------------------------------------------------------------------- Multi Wound Chart Details Patient Name: Date of Service: [name] [name], [place] [name] 07/01/2020 2:45 PM Medical Record Number: [id number] Patient Account Number: [id number] Date of Birth/Sex: Treating RN: 1965/05/02 (55 y.o. [name], [name] Primary Care Nashayla Telleria: [name] [name] Other Clinician: Referring Koralee Wedeking: Treating Alia Parsley/Extender: [name] [name] in Treatment: 0 Vital Signs Height(in): 65 Capillary Blood Glucose(mg/dl): 240 Weight(lbs): 240 Pulse(bpm): 105 Body Mass Index(BMI): 40 Blood Pressure(mmHg): 127/84 Temperature(F): 98.1 Respiratory Rate(breaths/min): 16 Photos: [2:Right Calcaneus] [3:Left Calcaneus] [N/A:N/A N/A] Wound Location: [2:Pressure Injury] [3:Pressure Injury] [N/A:N/A] Wounding Event: [2:Diabetic Wound/Ulcer of the Lower] [3:Diabetic Wound/Ulcer of the Lower] [N/A:N/A] Primary Etiology: [2:Extremity Anemia, Congestive Heart Failure,] [3:Extremity Anemia, Congestive Heart Failure,] [N/A:N/A] Comorbid History: [2:Hypertension, Type II Diabetes, End Hypertension, Type II Diabetes, End Stage Renal Disease, Raynauds, Stage Renal Disease, Raynauds, Neuropathy 04/22/2020] [3:Neuropathy 04/22/2020] [N/A:N/A] Date Acquired: [2:0] [3:0] [N/A:N/A] Weeks of Treatment: [2:Open] [3:Open] [N/A:N/A] Wound Status: [2:5x2.7x0.2] [3:4.4x5.5x0.9] [N/A:N/A] Measurements L x W x D (cm) [2:10.603] [3:19.007] [N/A:N/A] A (cm) : rea [2:2.121] [3:17.106] [N/A:N/A] Volume (cm) : [2:0.00%] [3:0.00%] [N/A:N/A] % Reduction in A [2:rea: 0.00%] [3:0.00%] [N/A:N/A] % Reduction in Volume: [2:Grade 2] [3:Grade 2] [N/A:N/A] Classification: [2:Medium] [3:Medium] [N/A:N/A] Exudate A mount: [2:Serosanguineous] [3:Serosanguineous] [N/A:N/A] Exudate Type: [2:red, brown] [3:red, brown] [N/A:N/A] Exudate Color: [2:Flat and Intact] [3:Flat and Intact] [N/A:N/A] Wound Margin:  [2:Small (1-33%)] [3:Small (1-33%)] [N/A:N/A] Granulation A mount: [2:Pink] [3:Pink] [N/A:N/A] Granulation Quality: [2:Large (67-100%)] [3:Large (67-100%)] [N/A:N/A] Necrotic A mount: [2:Eschar, Adherent Slough] [3:Eschar, Adherent Slough] [N/A:N/A] Necrotic Tissue: [2:Fat Layer (Subcutaneous Tissue): Yes Fat Layer (Subcutaneous Tissue): Yes N/A] Exposed Structures: [2:Fascia: No Tendon: No Muscle: No Joint: No Bone: No None] [3:Fascia: No Tendon: No Muscle: No Joint: No Bone: No None] [N/A:N/A] Epithelialization: [2:Debridement - Excisional] [3:Debridement - Excisional] [N/A:N/A] Debridement: Pre-procedure Verification/Time Out 16:34 [3:16:34] [N/A:N/A] Taken: [2:Other] [3:Other] [N/A:N/A] Pain Control: [2:Necrotic/Eschar, Subcutaneous,] [3:Necrotic/Eschar, Subcutaneous,] [N/A:N/A] Tissue Debrided: [2:Slough Skin/Subcutaneous Tissue] [3:Slough Skin/Subcutaneous Tissue] [N/A:N/A] Level: [2:13.5] [3:24.2] [N/A:N/A] Debridement A (sq cm): [2:rea Curette] [3:Curette] [N/A:N/A] Instrument: [2:Minimum] [3:Minimum] [N/A:N/A] Bleeding: [2:Pressure] [3:Pressure] [N/A:N/A] Hemostasis Achieved: [2:4] [3:4] [N/A:N/A] Procedural Pain: [2:2] [3:2] [N/A:N/A] Post Procedural Pain: Debridement Treatment Response: Procedure was tolerated well [3:Procedure was tolerated well] [N/A:N/A]  Post Debridement Measurements L x 5x2.7x0.2 [3:4.4x5.5x0.9] [N/A:N/A] W x D (cm) [2:2.121] [3:17.106] [N/A:N/A] Post Debridement Volume: (cm) [2:Debridement] [3:Debridement] [N/A:N/A] Treatment Notes Electronic Signature(s) Signed: 07/02/2020 5:03:07 PM By: Kalman Shan DO Signed: 07/03/2020 5:45:48 PM By: Rhae Hammock RN Entered By: Kalman Shan on 07/02/2020 16:42:45 -------------------------------------------------------------------------------- Multi-Disciplinary Care Plan Details Patient Name: Date of Service: [name] [name], [place] [name] 07/01/2020 2:45 PM Medical Record Number: [id number] Patient Account  Number: [id number] Date of Birth/Sex: Treating RN: Sep 11, 1965 (55 y.o. [name] [name] Primary Care Vantasia Pinkney: [name] [name] Other Clinician: Referring Jahzier Villalon: Treating Mahayla Haddaway/Extender: [name] [name] in Treatment: 0 Multidisciplinary Care Plan reviewed with physician Active Inactive Abuse / Safety / Falls / Self Care Management Nursing Diagnoses: Potential for falls Potential for injury related to falls Goals: Patient will not experience any injury related to falls Date Initiated: 07/01/2020 Target Resolution Date: 08/01/2020 Goal Status: Active Patient/caregiver will verbalize/demonstrate measures taken to prevent injury and/or falls Date Initiated: 07/01/2020 Target Resolution Date: 08/01/2020 Goal Status: Active Interventions: Assess Activities of Daily Living upon admission and as needed Assess fall risk on admission and as needed Assess: immobility, friction, shearing, incontinence upon admission and as needed Assess impairment of mobility on admission and as needed per policy Assess personal safety and home safety (as indicated) on admission and as needed Provide education on fall prevention Provide education on personal and home safety Notes: Nutrition Nursing Diagnoses: Impaired glucose control: actual or potential Potential for alteratiion in Nutrition/Potential for imbalanced nutrition Goals: Patient/caregiver agrees to and verbalizes understanding of need to use nutritional supplements and/or vitamins as prescribed Date Initiated: 07/01/2020 Target Resolution Date: 08/01/2020 Goal Status: Active Patient/caregiver will maintain therapeutic glucose control Date Initiated: 07/01/2020 Target Resolution Date: 08/01/2020 Goal Status: Active Interventions: Assess HgA1c results as ordered upon admission and as needed Assess patient nutrition upon admission and as needed per policy Provide education on elevated blood sugars and impact  on wound healing Provide education on nutrition Notes: Wound/Skin Impairment Nursing Diagnoses: Impaired tissue integrity Knowledge deficit related to ulceration/compromised skin integrity Goals: Patient/caregiver will verbalize understanding of skin care regimen Date Initiated: 07/01/2020 Target Resolution Date: 08/01/2020 Goal Status: Active Ulcer/skin breakdown will have a volume reduction of 30% by week 4 Date Initiated: 07/01/2020 Target Resolution Date: 08/01/2020 Goal Status: Active Interventions: Assess patient/caregiver ability to obtain necessary supplies Assess patient/caregiver ability to perform ulcer/skin care regimen upon admission and as needed Assess ulceration(s) every visit Provide education on ulcer and skin care Notes: Electronic Signature(s) Signed: 07/02/2020 5:13:29 PM By: [name] [name] Entered By: [name] [name] on 07/01/2020 17:36:21 -------------------------------------------------------------------------------- Pain Assessment Details Patient Name: Date of Service: [name] [name], [place] [name] 07/01/2020 2:45 PM Medical Record Number: [id number] Patient Account Number: [id number] Date of Birth/Sex: Treating RN: 04/21/1965 (55 y.o. [name] [name] Primary Care Evalyne Cortopassi: [name] [name] Other Clinician: Referring Jacksen Isip: Treating Braeton Wolgamott/Extender: [name] [name] in Treatment: 0 Active Problems Location of Pain Severity and Description of Pain Patient Has Paino No Site Locations Pain Management and Medication Current Pain Management: Electronic Signature(s) Signed: 07/02/2020 5:13:29 PM By: [name] [name] Entered By: [name] [name] on 07/01/2020 16:12:38 -------------------------------------------------------------------------------- Patient/Caregiver Education Details Patient Name: Date of Service: [name] [name], Jacklynn Bue 4/12/2022andnbsp2:45 PM Medical Record Number: [id number] Patient Account Number:  [id number] Date of Birth/Gender: Treating RN: [birth year] (55 y.o. [name] [name] Primary Care Physician: [name] [name] Other Clinician: Referring Physician: Treating Physician/Extender: [name] [name] in Treatment: 0 Education Assessment Education Provided To:  Patient Education Topics Provided Elevated Blood Sugar/ Impact on Healing: Methods: Explain/Verbal Responses: State content correctly Nutrition: Methods: Explain/Verbal Responses: State content correctly Safety: Methods: Explain/Verbal Responses: State content correctly Wound/Skin Impairment: Methods: Explain/Verbal Responses: State content correctly Electronic Signature(s) Signed: 07/02/2020 5:13:29 PM By: [name] [name] Entered By: [name] [name] on 07/01/2020 17:36:45 -------------------------------------------------------------------------------- Wound Assessment Details Patient Name: Date of Service: [name] [name], [place] [name] 07/01/2020 2:45 PM Medical Record Number: [id number] Patient Account Number: [id number] Date of Birth/Sex: Treating RN: 11-May-1965 (55 y.o. [name] [name] Primary Care Arnav Cregg: [name] [name] Other Clinician: Referring Roann Merk: Treating Atiyana Welte/Extender: [name] [name] in Treatment: 0 Wound Status Wound Number: 2 Primary Diabetic Wound/Ulcer of the Lower Extremity Etiology: Wound Location: Right Calcaneus Wound Open Wounding Event: Pressure Injury Status: Date Acquired: 04/22/2020 Comorbid Anemia, Congestive Heart Failure, Hypertension, Type II Diabetes, Weeks Of Treatment: 0 History: End Stage Renal Disease, Raynauds, Neuropathy Clustered Wound: No Photos Wound Measurements Length: (cm) 5 Width: (cm) 2.7 Depth: (cm) 0.2 Area: (cm) 10.603 Volume: (cm) 2.121 % Reduction in Area: 0% % Reduction in Volume: 0% Epithelialization: None Tunneling: No Undermining: No Wound Description Classification: Grade  2 Wound Margin: Flat and Intact Exudate Amount: Medium Exudate Type: Serosanguineous Exudate Color: red, brown Foul Odor After Cleansing: No Slough/Fibrino Yes Wound Bed Granulation Amount: Small (1-33%) Exposed Structure Granulation Quality: Pink Fascia Exposed: No Necrotic Amount: Large (67-100%) Fat Layer (Subcutaneous Tissue) Exposed: Yes Necrotic Quality: Eschar, Adherent Slough Tendon Exposed: No Muscle Exposed: No Joint Exposed: No Bone Exposed: No Electronic Signature(s) Signed: 07/02/2020 8:29:36 AM By: Sandre Kitty Signed: 07/02/2020 5:13:29 PM By: [name] [name] Entered By: Sandre Kitty on 07/01/2020 17:03:38 -------------------------------------------------------------------------------- Wound Assessment Details Patient Name: Date of Service: [name] Mocha, [place] [name] 07/01/2020 2:45 PM Medical Record Number: [id number] Patient Account Number: [id number] Date of Birth/Sex: Treating RN: 09-23-1965 (55 y.o. [name] [name] Primary Care Graylin Sperling: [name] [name] Other Clinician: Referring Sagar Tengan: Treating Emmory Solivan/Extender: [name] [name] in Treatment: 0 Wound Status Wound Number: 3 Primary Diabetic Wound/Ulcer of the Lower Extremity Etiology: Wound Location: Left Calcaneus Wound Open Wounding Event: Pressure Injury Status: Date Acquired: 04/22/2020 Comorbid Anemia, Congestive Heart Failure, Hypertension, Type II Diabetes, Weeks Of Treatment: 0 History: End Stage Renal Disease, Raynauds, Neuropathy Clustered Wound: No Photos Wound Measurements Length: (cm) 4.4 Width: (cm) 5.5 Depth: (cm) 0.9 Area: (cm) 19.007 Volume: (cm) 17.106 % Reduction in Area: 0% % Reduction in Volume: 0% Epithelialization: None Tunneling: No Undermining: No Wound Description Classification: Grade 2 Wound Margin: Flat and Intact Exudate Amount: Medium Exudate Type: Serosanguineous Exudate Color: red, brown Foul Odor After  Cleansing: No Slough/Fibrino Yes Wound Bed Granulation Amount: Small (1-33%) Exposed Structure Granulation Quality: Pink Fascia Exposed: No Necrotic Amount: Large (67-100%) Fat Layer (Subcutaneous Tissue) Exposed: Yes Necrotic Quality: Eschar, Adherent Slough Tendon Exposed: No Muscle Exposed: No Joint Exposed: No Bone Exposed: No Electronic Signature(s) Signed: 07/02/2020 8:29:36 AM By: Sandre Kitty Signed: 07/02/2020 5:13:29 PM By: [name] [name] Entered By: Sandre Kitty on 07/01/2020 17:04:03 -------------------------------------------------------------------------------- [place] Details Patient Name: Date of Service: [name] Mocha, [place] L. 07/01/2020 2:45 PM Medical Record Number: [id number] Patient Account Number: [id number] Date of Birth/Sex: Treating RN: 10-27-1965 (55 y.o. [name] [name] Primary Care Amilah Greenspan: [name] [name] Other Clinician: Referring Lotus Gover: Treating Eleora Sutherland/Extender: [name] [name] in Treatment: 0 Vital Signs Time Taken: 15:32 Temperature (F): 98.1 Height (in): 65 Pulse (bpm): 105 Source: Stated Respiratory Rate (breaths/min): 16 Weight (lbs): 240 Blood  Pressure (mmHg): 127/84 Source: Stated Capillary Blood Glucose (mg/dl): 240 Body Mass Index (BMI): 39.9 Reference Range: 80 - 120 mg / dl Notes glucose per pt report Electronic Signature(s) Signed: 07/02/2020 5:13:29 PM By: [name] [name] Entered By: [name] [name] on 07/01/2020 15:44:57

## 2020-07-04 DIAGNOSIS — Z4781 Encounter for orthopedic aftercare following surgical amputation: Secondary | ICD-10-CM | POA: Diagnosis not present

## 2020-07-04 DIAGNOSIS — L97419 Non-pressure chronic ulcer of right heel and midfoot with unspecified severity: Secondary | ICD-10-CM | POA: Diagnosis not present

## 2020-07-04 DIAGNOSIS — E1169 Type 2 diabetes mellitus with other specified complication: Secondary | ICD-10-CM | POA: Diagnosis not present

## 2020-07-04 DIAGNOSIS — I96 Gangrene, not elsewhere classified: Secondary | ICD-10-CM | POA: Diagnosis not present

## 2020-07-04 DIAGNOSIS — L97429 Non-pressure chronic ulcer of left heel and midfoot with unspecified severity: Secondary | ICD-10-CM | POA: Diagnosis not present

## 2020-07-04 DIAGNOSIS — E11621 Type 2 diabetes mellitus with foot ulcer: Secondary | ICD-10-CM | POA: Diagnosis not present

## 2020-07-04 DIAGNOSIS — M869 Osteomyelitis, unspecified: Secondary | ICD-10-CM | POA: Diagnosis not present

## 2020-07-05 DIAGNOSIS — E785 Hyperlipidemia, unspecified: Secondary | ICD-10-CM | POA: Diagnosis not present

## 2020-07-05 DIAGNOSIS — M86172 Other acute osteomyelitis, left ankle and foot: Secondary | ICD-10-CM | POA: Diagnosis not present

## 2020-07-05 DIAGNOSIS — R609 Edema, unspecified: Secondary | ICD-10-CM | POA: Diagnosis not present

## 2020-07-05 DIAGNOSIS — D631 Anemia in chronic kidney disease: Secondary | ICD-10-CM | POA: Diagnosis not present

## 2020-07-05 DIAGNOSIS — Z23 Encounter for immunization: Secondary | ICD-10-CM | POA: Diagnosis not present

## 2020-07-05 DIAGNOSIS — N186 End stage renal disease: Secondary | ICD-10-CM | POA: Diagnosis not present

## 2020-07-05 DIAGNOSIS — N2581 Secondary hyperparathyroidism of renal origin: Secondary | ICD-10-CM | POA: Diagnosis not present

## 2020-07-07 ENCOUNTER — Encounter (HOSPITAL_BASED_OUTPATIENT_CLINIC_OR_DEPARTMENT_OTHER): Payer: Medicare Other | Admitting: Internal Medicine

## 2020-07-07 DIAGNOSIS — Z4781 Encounter for orthopedic aftercare following surgical amputation: Secondary | ICD-10-CM | POA: Diagnosis not present

## 2020-07-07 DIAGNOSIS — L97419 Non-pressure chronic ulcer of right heel and midfoot with unspecified severity: Secondary | ICD-10-CM | POA: Diagnosis not present

## 2020-07-07 DIAGNOSIS — L97429 Non-pressure chronic ulcer of left heel and midfoot with unspecified severity: Secondary | ICD-10-CM | POA: Diagnosis not present

## 2020-07-07 DIAGNOSIS — E1169 Type 2 diabetes mellitus with other specified complication: Secondary | ICD-10-CM | POA: Diagnosis not present

## 2020-07-07 DIAGNOSIS — E11621 Type 2 diabetes mellitus with foot ulcer: Secondary | ICD-10-CM | POA: Diagnosis not present

## 2020-07-07 DIAGNOSIS — M869 Osteomyelitis, unspecified: Secondary | ICD-10-CM | POA: Diagnosis not present

## 2020-07-08 DIAGNOSIS — E785 Hyperlipidemia, unspecified: Secondary | ICD-10-CM | POA: Diagnosis not present

## 2020-07-08 DIAGNOSIS — N2581 Secondary hyperparathyroidism of renal origin: Secondary | ICD-10-CM | POA: Diagnosis not present

## 2020-07-08 DIAGNOSIS — M86172 Other acute osteomyelitis, left ankle and foot: Secondary | ICD-10-CM | POA: Diagnosis not present

## 2020-07-08 DIAGNOSIS — N186 End stage renal disease: Secondary | ICD-10-CM | POA: Diagnosis not present

## 2020-07-08 DIAGNOSIS — D631 Anemia in chronic kidney disease: Secondary | ICD-10-CM | POA: Diagnosis not present

## 2020-07-08 DIAGNOSIS — Z23 Encounter for immunization: Secondary | ICD-10-CM | POA: Diagnosis not present

## 2020-07-08 NOTE — Progress Notes (Signed)
  Subjective:  Patient ID: [name] [name], male    DOB: 13-Apr-1965,  MRN: [id number]  Chief Complaint  Patient presents with  . Routine Post Op      POV #2 DOS 06/04/2020 DEBRIDE WOUND LT, SKIN GRAFT LT/DR Jacqualyn Posey PT    55 y.o. male presents with the above complaint. History confirmed with patient.  He has begun seeing the wound care center  Objective:  Physical Exam: Pulses palpable, skin is warm and well-perfused, heels are wrapped in dressings    Assessment:   1. Uncontrolled type 2 diabetes mellitus with hyperglycemia ([place])   2. 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  Ulcer bilateral heel -At this point will defer all treatment to the wound care center and allow them to manage these directly.  Return to see [place] for at risk diabetic foot care as needed.     No follow-ups on file.

## 2020-07-10 DIAGNOSIS — D631 Anemia in chronic kidney disease: Secondary | ICD-10-CM | POA: Diagnosis not present

## 2020-07-10 DIAGNOSIS — M86172 Other acute osteomyelitis, left ankle and foot: Secondary | ICD-10-CM | POA: Diagnosis not present

## 2020-07-10 DIAGNOSIS — Z23 Encounter for immunization: Secondary | ICD-10-CM | POA: Diagnosis not present

## 2020-07-10 DIAGNOSIS — N2581 Secondary hyperparathyroidism of renal origin: Secondary | ICD-10-CM | POA: Diagnosis not present

## 2020-07-10 DIAGNOSIS — E785 Hyperlipidemia, unspecified: Secondary | ICD-10-CM | POA: Diagnosis not present

## 2020-07-10 DIAGNOSIS — N186 End stage renal disease: Secondary | ICD-10-CM | POA: Diagnosis not present

## 2020-07-12 DIAGNOSIS — M86172 Other acute osteomyelitis, left ankle and foot: Secondary | ICD-10-CM | POA: Diagnosis not present

## 2020-07-12 DIAGNOSIS — D631 Anemia in chronic kidney disease: Secondary | ICD-10-CM | POA: Diagnosis not present

## 2020-07-12 DIAGNOSIS — Z23 Encounter for immunization: Secondary | ICD-10-CM | POA: Diagnosis not present

## 2020-07-12 DIAGNOSIS — N186 End stage renal disease: Secondary | ICD-10-CM | POA: Diagnosis not present

## 2020-07-12 DIAGNOSIS — N2581 Secondary hyperparathyroidism of renal origin: Secondary | ICD-10-CM | POA: Diagnosis not present

## 2020-07-12 DIAGNOSIS — E785 Hyperlipidemia, unspecified: Secondary | ICD-10-CM | POA: Diagnosis not present

## 2020-07-14 DIAGNOSIS — Z4781 Encounter for orthopedic aftercare following surgical amputation: Secondary | ICD-10-CM | POA: Diagnosis not present

## 2020-07-14 DIAGNOSIS — E1169 Type 2 diabetes mellitus with other specified complication: Secondary | ICD-10-CM | POA: Diagnosis not present

## 2020-07-14 DIAGNOSIS — L97419 Non-pressure chronic ulcer of right heel and midfoot with unspecified severity: Secondary | ICD-10-CM | POA: Diagnosis not present

## 2020-07-14 DIAGNOSIS — E11621 Type 2 diabetes mellitus with foot ulcer: Secondary | ICD-10-CM | POA: Diagnosis not present

## 2020-07-14 DIAGNOSIS — L97429 Non-pressure chronic ulcer of left heel and midfoot with unspecified severity: Secondary | ICD-10-CM | POA: Diagnosis not present

## 2020-07-14 DIAGNOSIS — M869 Osteomyelitis, unspecified: Secondary | ICD-10-CM | POA: Diagnosis not present

## 2020-07-15 DIAGNOSIS — E785 Hyperlipidemia, unspecified: Secondary | ICD-10-CM | POA: Diagnosis not present

## 2020-07-15 DIAGNOSIS — N2581 Secondary hyperparathyroidism of renal origin: Secondary | ICD-10-CM | POA: Diagnosis not present

## 2020-07-15 DIAGNOSIS — M86172 Other acute osteomyelitis, left ankle and foot: Secondary | ICD-10-CM | POA: Diagnosis not present

## 2020-07-15 DIAGNOSIS — Z23 Encounter for immunization: Secondary | ICD-10-CM | POA: Diagnosis not present

## 2020-07-15 DIAGNOSIS — D631 Anemia in chronic kidney disease: Secondary | ICD-10-CM | POA: Diagnosis not present

## 2020-07-15 DIAGNOSIS — N186 End stage renal disease: Secondary | ICD-10-CM | POA: Diagnosis not present

## 2020-07-16 DIAGNOSIS — Z4781 Encounter for orthopedic aftercare following surgical amputation: Secondary | ICD-10-CM | POA: Diagnosis not present

## 2020-07-16 DIAGNOSIS — L97429 Non-pressure chronic ulcer of left heel and midfoot with unspecified severity: Secondary | ICD-10-CM | POA: Diagnosis not present

## 2020-07-16 DIAGNOSIS — E1169 Type 2 diabetes mellitus with other specified complication: Secondary | ICD-10-CM | POA: Diagnosis not present

## 2020-07-16 DIAGNOSIS — E11621 Type 2 diabetes mellitus with foot ulcer: Secondary | ICD-10-CM | POA: Diagnosis not present

## 2020-07-16 DIAGNOSIS — L97419 Non-pressure chronic ulcer of right heel and midfoot with unspecified severity: Secondary | ICD-10-CM | POA: Diagnosis not present

## 2020-07-16 DIAGNOSIS — M869 Osteomyelitis, unspecified: Secondary | ICD-10-CM | POA: Diagnosis not present

## 2020-07-17 ENCOUNTER — Telehealth: Payer: Self-pay | Admitting: Podiatry

## 2020-07-17 ENCOUNTER — Encounter: Payer: Medicare Other | Admitting: Podiatry

## 2020-07-17 DIAGNOSIS — Z792 Long term (current) use of antibiotics: Secondary | ICD-10-CM | POA: Diagnosis not present

## 2020-07-17 DIAGNOSIS — E1152 Type 2 diabetes mellitus with diabetic peripheral angiopathy with gangrene: Secondary | ICD-10-CM | POA: Diagnosis not present

## 2020-07-17 DIAGNOSIS — Z7982 Long term (current) use of aspirin: Secondary | ICD-10-CM | POA: Diagnosis not present

## 2020-07-17 DIAGNOSIS — Z9641 Presence of insulin pump (external) (internal): Secondary | ICD-10-CM | POA: Diagnosis not present

## 2020-07-17 DIAGNOSIS — Z89011 Acquired absence of right thumb: Secondary | ICD-10-CM | POA: Diagnosis not present

## 2020-07-17 DIAGNOSIS — I7300 Raynaud's syndrome without gangrene: Secondary | ICD-10-CM | POA: Diagnosis not present

## 2020-07-17 DIAGNOSIS — I120 Hypertensive chronic kidney disease with stage 5 chronic kidney disease or end stage renal disease: Secondary | ICD-10-CM | POA: Diagnosis not present

## 2020-07-17 DIAGNOSIS — Z4781 Encounter for orthopedic aftercare following surgical amputation: Secondary | ICD-10-CM | POA: Diagnosis not present

## 2020-07-17 DIAGNOSIS — L97419 Non-pressure chronic ulcer of right heel and midfoot with unspecified severity: Secondary | ICD-10-CM | POA: Diagnosis not present

## 2020-07-17 DIAGNOSIS — E11621 Type 2 diabetes mellitus with foot ulcer: Secondary | ICD-10-CM | POA: Diagnosis not present

## 2020-07-17 DIAGNOSIS — E7800 Pure hypercholesterolemia, unspecified: Secondary | ICD-10-CM | POA: Diagnosis not present

## 2020-07-17 DIAGNOSIS — M869 Osteomyelitis, unspecified: Secondary | ICD-10-CM | POA: Diagnosis not present

## 2020-07-17 DIAGNOSIS — E1142 Type 2 diabetes mellitus with diabetic polyneuropathy: Secondary | ICD-10-CM | POA: Diagnosis not present

## 2020-07-17 DIAGNOSIS — E1122 Type 2 diabetes mellitus with diabetic chronic kidney disease: Secondary | ICD-10-CM | POA: Diagnosis not present

## 2020-07-17 DIAGNOSIS — Z9181 History of falling: Secondary | ICD-10-CM | POA: Diagnosis not present

## 2020-07-17 DIAGNOSIS — N186 End stage renal disease: Secondary | ICD-10-CM | POA: Diagnosis not present

## 2020-07-17 DIAGNOSIS — N2581 Secondary hyperparathyroidism of renal origin: Secondary | ICD-10-CM | POA: Diagnosis not present

## 2020-07-17 DIAGNOSIS — D631 Anemia in chronic kidney disease: Secondary | ICD-10-CM | POA: Diagnosis not present

## 2020-07-17 DIAGNOSIS — Z992 Dependence on renal dialysis: Secondary | ICD-10-CM | POA: Diagnosis not present

## 2020-07-17 DIAGNOSIS — Z89021 Acquired absence of right finger(s): Secondary | ICD-10-CM | POA: Diagnosis not present

## 2020-07-17 DIAGNOSIS — E1169 Type 2 diabetes mellitus with other specified complication: Secondary | ICD-10-CM | POA: Diagnosis not present

## 2020-07-17 DIAGNOSIS — M1030 Gout due to renal impairment, unspecified site: Secondary | ICD-10-CM | POA: Diagnosis not present

## 2020-07-17 DIAGNOSIS — L97429 Non-pressure chronic ulcer of left heel and midfoot with unspecified severity: Secondary | ICD-10-CM | POA: Diagnosis not present

## 2020-07-17 DIAGNOSIS — Z89022 Acquired absence of left finger(s): Secondary | ICD-10-CM | POA: Diagnosis not present

## 2020-07-17 NOTE — Telephone Encounter (Signed)
If needed he can come by and we can exchange boots. However if he is going to be treated at the wound care center then I will let them decide what boots/shoes to wear.

## 2020-07-17 NOTE — Telephone Encounter (Signed)
I spoke to patient and he wanted to know if he should just continue care with the wound care center instead of coming to the [facility] office.  Patient also wanted to know how can he be comfortable while wearing the boots on his foot. He states that the right foot boot is heavier than the left boot. He states that the left foot (boot)  Is uncomfortable.  Please Advise.

## 2020-07-18 ENCOUNTER — Encounter (HOSPITAL_COMMUNITY): Payer: Self-pay | Admitting: Emergency Medicine

## 2020-07-18 ENCOUNTER — Inpatient Hospital Stay: Payer: Medicare Other | Admitting: Infectious Diseases

## 2020-07-18 ENCOUNTER — Emergency Department (HOSPITAL_COMMUNITY): Payer: Medicare Other

## 2020-07-18 ENCOUNTER — Emergency Department (HOSPITAL_COMMUNITY)
Admission: EM | Admit: 2020-07-18 | Discharge: 2020-07-19 | Disposition: A | Discharge status: Home / Self Care | Payer: Medicare Other | Attending: Emergency Medicine | Admitting: Emergency Medicine

## 2020-07-18 ENCOUNTER — Other Ambulatory Visit: Payer: Self-pay

## 2020-07-18 DIAGNOSIS — R0902 Hypoxemia: Secondary | ICD-10-CM | POA: Insufficient documentation

## 2020-07-18 DIAGNOSIS — I5042 Chronic combined systolic (congestive) and diastolic (congestive) heart failure: Secondary | ICD-10-CM | POA: Diagnosis not present

## 2020-07-18 DIAGNOSIS — N186 End stage renal disease: Secondary | ICD-10-CM | POA: Insufficient documentation

## 2020-07-18 DIAGNOSIS — Z7982 Long term (current) use of aspirin: Secondary | ICD-10-CM | POA: Insufficient documentation

## 2020-07-18 DIAGNOSIS — E8770 Fluid overload, unspecified: Secondary | ICD-10-CM | POA: Diagnosis not present

## 2020-07-18 DIAGNOSIS — R0602 Shortness of breath: Secondary | ICD-10-CM | POA: Diagnosis not present

## 2020-07-18 DIAGNOSIS — E1122 Type 2 diabetes mellitus with diabetic chronic kidney disease: Secondary | ICD-10-CM | POA: Insufficient documentation

## 2020-07-18 DIAGNOSIS — Z794 Long term (current) use of insulin: Secondary | ICD-10-CM | POA: Diagnosis not present

## 2020-07-18 DIAGNOSIS — I959 Hypotension, unspecified: Secondary | ICD-10-CM | POA: Diagnosis not present

## 2020-07-18 DIAGNOSIS — R609 Edema, unspecified: Secondary | ICD-10-CM | POA: Diagnosis not present

## 2020-07-18 DIAGNOSIS — I517 Cardiomegaly: Secondary | ICD-10-CM | POA: Diagnosis not present

## 2020-07-18 DIAGNOSIS — I132 Hypertensive heart and chronic kidney disease with heart failure and with stage 5 chronic kidney disease, or end stage renal disease: Secondary | ICD-10-CM | POA: Insufficient documentation

## 2020-07-18 DIAGNOSIS — Z992 Dependence on renal dialysis: Secondary | ICD-10-CM | POA: Insufficient documentation

## 2020-07-18 DIAGNOSIS — R600 Localized edema: Secondary | ICD-10-CM | POA: Diagnosis not present

## 2020-07-18 DIAGNOSIS — E1165 Type 2 diabetes mellitus with hyperglycemia: Secondary | ICD-10-CM | POA: Diagnosis not present

## 2020-07-18 DIAGNOSIS — J189 Pneumonia, unspecified organism: Secondary | ICD-10-CM | POA: Diagnosis not present

## 2020-07-18 LAB — CBC WITH DIFFERENTIAL/PLATELET
Abs Immature Granulocytes: 0.06 10*3/uL (ref 0.00–0.07)
Basophils Absolute: 0 10*3/uL (ref 0.0–0.1)
Basophils Relative: 0 %
Eosinophils Absolute: 0 10*3/uL (ref 0.0–0.5)
Eosinophils Relative: 0 %
HCT: 32.1 % — ABNORMAL LOW (ref 39.0–52.0)
Hemoglobin: 9.6 g/dL — ABNORMAL LOW (ref 13.0–17.0)
Immature Granulocytes: 1 %
Lymphocytes Relative: 5 %
Lymphs Abs: 0.6 10*3/uL — ABNORMAL LOW (ref 0.7–4.0)
MCH: 29.6 pg (ref 26.0–34.0)
MCHC: 29.9 g/dL — ABNORMAL LOW (ref 30.0–36.0)
MCV: 99.1 fL (ref 80.0–100.0)
Monocytes Absolute: 0.5 10*3/uL (ref 0.1–1.0)
Monocytes Relative: 5 %
Neutro Abs: 9.8 10*3/uL — ABNORMAL HIGH (ref 1.7–7.7)
Neutrophils Relative %: 89 %
Platelets: 319 10*3/uL (ref 150–400)
RBC: 3.24 MIL/uL — ABNORMAL LOW (ref 4.22–5.81)
RDW: 16.9 % — ABNORMAL HIGH (ref 11.5–15.5)
WBC: 11 10*3/uL — ABNORMAL HIGH (ref 4.0–10.5)
nRBC: 0 % (ref 0.0–0.2)

## 2020-07-18 LAB — COMPREHENSIVE METABOLIC PANEL
ALT: 13 U/L (ref 0–44)
AST: 16 U/L (ref 15–41)
Albumin: 1.9 g/dL — ABNORMAL LOW (ref 3.5–5.0)
Alkaline Phosphatase: 485 U/L — ABNORMAL HIGH (ref 38–126)
Anion gap: 15 (ref 5–15)
BUN: 47 mg/dL — ABNORMAL HIGH (ref 6–20)
CO2: 23 mmol/L (ref 22–32)
Calcium: 8.1 mg/dL — ABNORMAL LOW (ref 8.9–10.3)
Chloride: 97 mmol/L — ABNORMAL LOW (ref 98–111)
Creatinine, Ser: 8.09 mg/dL — ABNORMAL HIGH (ref 0.61–1.24)
GFR, Estimated: 7 mL/min — ABNORMAL LOW (ref >60)
Glucose, Bld: 291 mg/dL — ABNORMAL HIGH (ref 70–99)
Potassium: 5.4 mmol/L — ABNORMAL HIGH (ref 3.5–5.1)
Sodium: 135 mmol/L (ref 135–145)
Total Bilirubin: 0.9 mg/dL (ref 0.3–1.2)
Total Protein: 6.4 g/dL — ABNORMAL LOW (ref 6.5–8.1)

## 2020-07-18 LAB — CBC
HCT: 32 % — ABNORMAL LOW (ref 39.0–52.0)
Hemoglobin: 9.5 g/dL — ABNORMAL LOW (ref 13.0–17.0)
MCH: 29.1 pg (ref 26.0–34.0)
MCHC: 29.7 g/dL — ABNORMAL LOW (ref 30.0–36.0)
MCV: 98.2 fL (ref 80.0–100.0)
Platelets: 293 10*3/uL (ref 150–400)
RBC: 3.26 MIL/uL — ABNORMAL LOW (ref 4.22–5.81)
RDW: 16.8 % — ABNORMAL HIGH (ref 11.5–15.5)
WBC: 9.4 10*3/uL (ref 4.0–10.5)
nRBC: 0 % (ref 0.0–0.2)

## 2020-07-18 LAB — TROPONIN I (HIGH SENSITIVITY)
Troponin I (High Sensitivity): 85 ng/L — ABNORMAL HIGH (ref <18)
Troponin I (High Sensitivity): 78 ng/L — ABNORMAL HIGH (ref <18)

## 2020-07-18 LAB — RENAL FUNCTION PANEL
Albumin: 1.7 g/dL — ABNORMAL LOW (ref 3.5–5.0)
Anion gap: 14 (ref 5–15)
BUN: 49 mg/dL — ABNORMAL HIGH (ref 6–20)
CO2: 23 mmol/L (ref 22–32)
Calcium: 7.9 mg/dL — ABNORMAL LOW (ref 8.9–10.3)
Chloride: 98 mmol/L (ref 98–111)
Creatinine, Ser: 8.16 mg/dL — ABNORMAL HIGH (ref 0.61–1.24)
GFR, Estimated: 7 mL/min — ABNORMAL LOW (ref >60)
Glucose, Bld: 171 mg/dL — ABNORMAL HIGH (ref 70–99)
Phosphorus: 7 mg/dL — ABNORMAL HIGH (ref 2.5–4.6)
Potassium: 5.4 mmol/L — ABNORMAL HIGH (ref 3.5–5.1)
Sodium: 135 mmol/L (ref 135–145)

## 2020-07-18 LAB — HEPATITIS B SURFACE ANTIGEN: Hepatitis B Surface Ag: NONREACTIVE

## 2020-07-18 MED ORDER — ALTEPLASE 2 MG IJ SOLR: 2.0000 mg | Freq: Once | INTRAMUSCULAR | Status: DC | PRN

## 2020-07-18 MED ORDER — LIDOCAINE-PRILOCAINE 2.5-2.5 % EX CREA: 1.0000 "application " | TOPICAL_CREAM | CUTANEOUS | Status: DC | PRN

## 2020-07-18 MED ORDER — SODIUM CHLORIDE 0.9 % IV SOLN: 100.0000 mL | INTRAVENOUS | Status: DC | PRN

## 2020-07-18 MED ORDER — CHLORHEXIDINE GLUCONATE CLOTH 2 % EX PADS: 6.0000 | MEDICATED_PAD | Freq: Every day | CUTANEOUS | Status: DC

## 2020-07-18 MED ORDER — HEPARIN SODIUM (PORCINE) 1000 UNIT/ML DIALYSIS: 1000.0000 [IU] | INTRAMUSCULAR | Status: DC | PRN

## 2020-07-18 MED ORDER — PENTAFLUOROPROP-TETRAFLUOROETH EX AERO: 1.0000 "application " | INHALATION_SPRAY | CUTANEOUS | Status: DC | PRN

## 2020-07-18 MED ORDER — LIDOCAINE HCL (PF) 1 % IJ SOLN: 5.0000 mL | INTRAMUSCULAR | Status: DC | PRN

## 2020-07-18 NOTE — ED Provider Notes (Signed)
Patient finished dialysis and return to the ER without any additional adverse events.  Vital signs remained stable.  Will be discharged home to follow-up with his nephrologist within the week.  Advising immediate return for fevers pain worsening symptoms or any additional concerns.   Luna Fuse, MD 07/18/20 2352

## 2020-07-18 NOTE — ED Provider Notes (Signed)
[place] EMERGENCY DEPARTMENT Provider Note   CSN: [id number] Arrival date & time: 07/18/20  1103     History Chief Complaint  Patient presents with  . Leg Swelling    [name] [name] is a 55 y.o. male.  The history is provided by the patient and medical records. No language interpreter was used.  Shortness of Breath Severity:  Moderate Onset quality:  Gradual Duration:  2 days Timing:  Constant Progression:  Waxing and waning Chronicity:  Recurrent Context: not URI   Relieved by:  Rest Worsened by:  Exertion Ineffective treatments:  None tried Associated symptoms: no abdominal pain, no chest pain, no claudication, no cough, no diaphoresis, no fever, no headaches, no rash, no sputum production, no syncope, no vomiting and no wheezing   Risk factors comment:  Missed dialysis      Past Medical History:  Diagnosis Date  . Anal infection    posterior anal canal  . Anemia, chronic renal failure   . Atrophic kidney    BILATERAL  . DM type 2 causing ESRD [facility])    Nephrologist-- dr [name] [facility])--  on hemodialysis since June 2012 at  [name]  TTS  . Hemodialysis patient [facility])    at [place] on Tues/ Thur/Sat/schedule  . Hemorrhoids   . Hepatitis B antibody positive   . History of pleural effusion    bilateral  . Hyperparathyroidism, secondary renal ([place])   . Hypertension   . Ischemic cardiomyopathy    per echo 07-01-2014  ef 45%  . LAFB (left anterior fascicular block)   . Peripheral neuropathy   . Systolic and diastolic CHF, chronic ([place])    CARDIOLOGIST-  DR Daneen Schick ([place])  AND DR [name] ([name])    Patient Active Problem List   Diagnosis Date Noted  . Osteomyelitis due to type 2 diabetes mellitus ([place]) 05/12/2020  . Diabetic foot ulcer ([place]) 05/12/2020  . HLD (hyperlipidemia) 02/08/2020  . Secondary hyperparathyroidism ([place]) 02/08/2020  . Secondary anemia  02/08/2020  . Retinal detachment 02/08/2020  . Medication monitoring encounter 07/25/2019  . Penile mass 07/16/2019  . BMI 40.0-44.9, adult ([place]) 12/20/2018  . Wound infection 09/15/2018  . 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 Acute GI bleeding 10/13/2017  . ESRD on hemodialysis ([place]) 10/13/2017  . Embolism, arterial ([place]) 10/12/2017  . Ischemia of finger 2017/10/05  . Deceased-donor kidney transplant 06/04/2016  . 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 Diabetes mellitus type 2, uncontrolled ([place]) 05/03/2014  . Non-ischemic cardiomyopathy ([place]) 05/03/2014  . Type II diabetes mellitus with renal manifestations ([place]) 02/01/2011  . Mechanical complication of other vascular device, implant, and graft 02/01/2011  . HYPERCHOLESTEROLEMIA 08/19/2009  . Essential hypertension 08/19/2009  . Chronic systolic heart failure ([place]) 08/19/2009  . CKD (chronic kidney disease) stage V requiring chronic dialysis ([place]) 08/19/2009  . Depression 08/19/2009    Past Surgical History:  Procedure Laterality Date  . APPENDECTOMY  09-12-2004   laparotomy w/ drainage peritinitis  . AV FISTULA PLACEMENT  02-27-2010   right forearm (RADIOCEPHALIC)  . AV FISTULA REPAIR  10-30-2010  . BONE BIOPSY Left 05/14/2020   Procedure: BONE BIOPSY;  Surgeon: Trula Slade, DPM;  Location: [facility];  Service: Podiatry;  Laterality: Left;  . CARDIOVASCULAR STRESS TEST  10-29-2011   dr Daneen Schick   Low risk scan;  mild perfusion defect seen in the basal  inferoseptal, basal inferior and mid inferior regions consistent with an infarct/scar and/or overlying attenuation/  mild to moderate global LVSF,  ef 40-45%  . DOBUTAMINE STRESS ECHO  07-23-2012   [name]   abnormal ;  at rest estimated lvef 25-30% and global severe LV hypokinesis ;  no cp during stress and achieved 85% maxium predicted heart rate;  negative stress ECG for inducible ischemia;  estimated lvef with stress  35-40%;  augmentation of wall segments consistant with cardiomyopathy and differential fibrosis  . FISTULOTOMY N/A 03/26/2015   Procedure: FISTULOTOMY;  Surgeon: [name], MD;  Location: [facility];  Service: General;  Laterality: N/A;  . GRAFT APPLICATION Left 0000000   Procedure: GRAFT APPLICATION;  Surgeon: Trula Slade, DPM;  Location: [place];  Service: Podiatry;  Laterality: Left;  [name] GRAFT APPLICATION Bilateral 0000000   Procedure: GRAFT APPLICATION;  Surgeon: Trula Slade, DPM;  Location: [facility];  Service: Podiatry;  Laterality: Bilateral;  . INCISION AND DRAINAGE ABSCESS N/A 03/26/2015   Procedure: ANAL INCISION AND DRAINAGE;  Surgeon: [name], MD;  Location: [facility];  Service: General;  Laterality: N/A;  . RETINAL DETACHMENT SURGERY Left 2011   incomplete repair/ needs eye drops to keep pressure down  . TEE WITHOUT CARDIOVERSION N/A 10/17/2017   Procedure: TRANSESOPHAGEAL ECHOCARDIOGRAM (TEE);  Surgeon: Josue Hector, MD;  Location[facility] ENDOSCOPY;  Service: Cardiovascular;  Laterality: N/A;  . TRANSTHORACIC ECHOCARDIOGRAM  07-01-2014    done at [facility]   grade 1 diastolic dysfunction,  ef 45%/  trace TR and PR  . WOUND DEBRIDEMENT Left 05/14/2020   Procedure: DEBRIDEMENT WOUND;  Surgeon: Trula Slade, DPM;  Location: [facility];  Service: Podiatry;  Laterality: Left;  . WOUND DEBRIDEMENT Bilateral 06/04/2020   Procedure: DEBRIDEMENT WOUND BOTH FEET;  Surgeon: Trula Slade, DPM;  Location: [facility];  Service: Podiatry;  Laterality: Bilateral;       Family History  Problem Relation Age of Onset  . Diabetes Other     Social History   Tobacco Use  . Smoking status: Never Smoker  . Smokeless tobacco: Never Used  Vaping Use  . Vaping Use: Never used  Substance Use Topics  . Alcohol use: No  . Drug use: No    Home Medications Prior to Admission medications   Medication Sig Start Date End Date  Taking? Authorizing Provider  aspirin 325 MG tablet Take 325 mg by mouth every evening.    [provider]  atorvastatin (LIPITOR) 10 MG tablet Take 10 mg by mouth every evening.    [provider]  calcium acetate (PHOSLO) 667 MG capsule Take 1,334 mg by mouth 2 (two) times daily with a meal. 06/02/10   [provider]  celecoxib (CELEBREX) 100 MG capsule Take 100 mg by mouth daily as needed (pain). 04/25/19   [provider]  collagenase (SANTYL) ointment Apply 1 application topically daily. 06/23/20   McDonald, Stephan Minister, DPM  Continuous Blood Gluc Sensor (FREESTYLE LIBRE 14 DAY SENSOR) MISC See admin instructions. 04/08/20   [provider]  cycloSPORINE, PF, (CEQUA) 0.09 % SOLN Place 1 drop into both eyes 2 (two) times daily.    [provider]  febuxostat (ULORIC) 40 MG tablet Take 40 mg by mouth every evening.    [provider]  ferric citrate (AURYXIA) 1 GM 210 MG(Fe) tablet Take 420 mg by mouth 2 (two) times daily with a meal.    [provider]  gabapentin (NEURONTIN) 300 MG capsule Take 300 mg by mouth 3 (three) times daily. 05/09/15   [provider]  ibuprofen (ADVIL) 200 MG tablet Take 600 mg by mouth 3 (three) times daily as needed (pain).    [provider]  Insulin Disposable Pump (OMNIPOD DASH 5 PACK PODS) MISC Inject into the skin. Use with Humalog 08/28/19   [provider]  insulin lispro (HUMALOG) 100 UNIT/ML injection Inject 0-3 Units into the skin 3 (three) times daily before meals.    [provider]  lidocaine (XYLOCAINE) 5 % ointment Apply 1 application topically 2 (two) times daily as needed (finger pain). 07/14/19   [provider]  midodrine (PROAMATINE) 5 MG tablet Take 5 mg by mouth Every Tuesday,Thursday,and Saturday with dialysis.    [provider]  Multiple Vitamin (MULTIVITAMIN WITH MINERALS) TABS tablet Take 1 tablet by mouth every evening.     [provider]  ondansetron (ZOFRAN) 4 MG tablet Take 4 mg by mouth 3 (three) times daily as needed for nausea or vomiting. 06/27/19   [provider]  sucroferric oxyhydroxide (VELPHORO) 500 MG chewable tablet Chew 500 mg by mouth See admin instructions. Chew one tablet (500 mg) by mouth twice daily - with lunch and supper    [provider]  triamcinolone cream (KENALOG) 0.1 % Apply 1 application topically daily as needed (itching). 07/14/19   [provider]    Allergies    Patient has no known allergies.  Review of Systems   Review of Systems  Constitutional: Positive for fatigue. Negative for chills, diaphoresis and fever.  HENT: Negative for congestion.   Eyes: Negative for visual disturbance.  Respiratory: Positive for shortness of breath. Negative for cough, sputum production, chest tightness and wheezing.   Cardiovascular: Positive for leg swelling (bilateral). Negative for chest pain, palpitations, claudication and syncope.  Gastrointestinal: Negative for abdominal pain, constipation, diarrhea, nausea and vomiting.  Genitourinary: Negative for dysuria, flank pain and frequency.  Musculoskeletal: Negative for back pain.  Skin: Negative for rash and wound.  Neurological: Negative for dizziness, light-headedness and headaches.  Psychiatric/Behavioral: Negative for agitation.  All other systems reviewed and are negative.   Physical Exam Updated Vital Signs BP 111/73 (BP Location: Right Arm)   Pulse (!) 103   Temp 99.6 F (37.6 C) (Oral)   Resp 12   SpO2 94%   Physical Exam Vitals and nursing note reviewed.  Constitutional:      General: He is not in acute distress.    Appearance: He is well-developed. He is not ill-appearing, toxic-appearing or diaphoretic.  HENT:     Head: Normocephalic and atraumatic.     Nose: No congestion or rhinorrhea.     Mouth/Throat:     Mouth: Mucous membranes are moist.     Pharynx: No oropharyngeal  exudate or posterior oropharyngeal erythema.  Eyes:     Extraocular Movements: Extraocular movements intact.     Conjunctiva/sclera: Conjunctivae normal.     Pupils: Pupils are equal, round, and reactive to light.  Cardiovascular:     Rate and Rhythm: Normal rate and regular rhythm.     Pulses: Normal pulses.     Heart sounds: No murmur heard.   Pulmonary:     Effort: Pulmonary effort is normal. No respiratory distress.     Breath sounds: Rales present. No wheezing or rhonchi.  Chest:     Chest wall: No tenderness.  Abdominal:     Palpations: Abdomen is soft.  Tenderness: There is no abdominal tenderness. There is no right CVA tenderness, left CVA tenderness, guarding or rebound.  Musculoskeletal:        General: No tenderness.     Cervical back: Neck supple. No tenderness.     Right lower leg: Edema present.     Left lower leg: Edema present.  Skin:    General: Skin is warm and dry.     Capillary Refill: Capillary refill takes less than 2 seconds.     Findings: No erythema.  Neurological:     General: No focal deficit present.     Mental Status: He is alert.     Sensory: No sensory deficit.     Motor: No weakness.  Psychiatric:        Mood and Affect: Mood normal.     ED Results / Procedures / Treatments   Labs (all labs ordered are listed, but only abnormal results are displayed) Labs Reviewed  CBC WITH DIFFERENTIAL/PLATELET - Abnormal; Notable for the following components:      Result Value   WBC 11.0 (*)    RBC 3.24 (*)    Hemoglobin 9.6 (*)    HCT 32.1 (*)    MCHC 29.9 (*)    RDW 16.9 (*)    Neutro Abs 9.8 (*)    Lymphs Abs 0.6 (*)    All other components within normal limits  COMPREHENSIVE METABOLIC PANEL - Abnormal; Notable for the following components:   Potassium 5.4 (*)    Chloride 97 (*)    Glucose, Bld 291 (*)    BUN 47 (*)    Creatinine, Ser 8.09 (*)    Calcium 8.1 (*)    Total Protein 6.4 (*)    Albumin 1.9 (*)    Alkaline Phosphatase 485  (*)    GFR, Estimated 7 (*)    All other components within normal limits  TROPONIN I (HIGH SENSITIVITY) - Abnormal; Notable for the following components:   Troponin I (High Sensitivity) 85 (*)    All other components within normal limits  TROPONIN I (HIGH SENSITIVITY)    EKG EKG Interpretation  Date/Time:  Friday July 18 2020 13:04:51 EDT Ventricular Rate:  92 PR Interval:  156 QRS Duration: 97 QT Interval:  410 QTC Calculation: 508 R Axis:   139 Text Interpretation: Sinus rhythm Probable left atrial enlargement Right axis deviation Borderline T wave abnormalities Prolonged QT interval When compared to prior, twave inversion in lead 3 now upright. No STEMI Confirmed by Antony Blackbird [phone]) on 07/18/2020 2:21:55 PM   Radiology DG Chest 2 View  Result Date: 07/18/2020 CLINICAL DATA:  Shortness of breath, missed dialysis EXAM: CHEST - 2 VIEW COMPARISON:  04/12/2019 FINDINGS: No frank interstitial edema. On the lateral view, there is mild lower lobe opacity, likely left lower lobe. No pleural effusion or pneumothorax. Cardiomegaly. IMPRESSION: Mild left lower lobe opacity, raising the possibility of pneumonia. No frank interstitial edema. Electronically Signed   By: Julian Hy M.D.   On: 07/18/2020 12:11    Procedures Procedures   Medications Ordered in ED Medications  Chlorhexidine Gluconate Cloth 2 % PADS 6 each (has no administration in time range)    ED Course  I have reviewed the triage vital signs and the nursing notes.  Pertinent labs & imaging results that were available during my care of the patient were reviewed by me and considered in my medical decision making (see chart for details).    MDM Rules/Calculators/A&P  BARTEK FANJOY is a 55 y.o. male with a past medical history significant for CHF, ESRD on dialysis TTS, hypertension, hypercholesterolemia, diabetes, depression, prior GI bleeding, and chronic anemia who presents with  worsening exertional shortness of breath and peripheral edema.  Patient reports that for the last 2 weeks, he has had worsening peripheral edema.  He is concerned about fluid building up diffusely.  He reports his legs have been more edematous and he is having some scrotal edema which she reports usually happens before he gets edema in his lungs.  He denies any actual pain in his legs abdomen or groin.  Denies any trauma.  He denies fevers, chills, chest, cough.  He report several weeks ago he ended up having COVID-19.  He reports no complications from that.  He reports the shortness of breath is primarily been with exertion or when he was talking for periods of time today.  He reports that he missed dialysis yesterday for unknown reasons.  He denies nausea vomiting, constipation, diarrhea.  He does not make urine.  He reports that he cannot lay flat.  He is concerned about his shortness of breath worsening in the setting of fluid overload and missing dialysis.  He reports that last time this happened him, he was admitted for a week try to get all the fluid off.  During my initial evaluation, patient's oxygen saturation dropped to 90% with good waveform during initial conversation.  At rest it goes back into the mid upper 90's on room air.  Have not yet ambulated patient to see is if he gets hypoxic.   On exam, lungs have rales diffusely but there is no significant rhonchi or wheezing.  Chest and abdomen are nontender.  Back nontender.  Patient moving all extremities.  He has edema in both legs.  He reported edema in the groin but it was not directly examined initially.  He denies any pain in his groin.  No abdominal tenderness.  EKG shows no STEMI.  Will get screening labs as well as chest x-ray.  Anticipate touching base with nephrology to discuss if patient needs emergent dialysis today.  2:29 PM On reassessment, oxygen saturations are now in the 80s.  Patient placed on 2 L nasal cannula which  improved oxygen saturations.  His x-ray shows a mid opacity in the left lower lobe raising positive pneumonia but patient denies any fevers, chills, or cough.  I clinically suspect those are changes related to his recent COVID infection.  I do not feel he has pneumonia currently even though he does have a mild leukocytosis.  Other labs show elevated creatinine as expected with mild elevation in potassium.  Due to his new oxygen requirement, the clear fluid overload on exam, will call nephrology for discussions of dialysis versus admission.  Troponin is elevated, will trend given his shortness of breath.  Spoke to nephrology who feels the patient is most appropriate to get dialysis today and then reassessment in the emergency department for likely discharge.  Anticipate reassessment after dialysis completed.  If he is still having symptoms and short of breath or hypoxic, anticipate he will likely need admission.  Nephrology did not feel he needs admission until after reassessment and dialysis.  They also agree that I have low suspicion he has pneumonia currently.   Final Clinical Impression(s) / ED Diagnoses Final diagnoses:  Peripheral edema  Shortness of breath  Hypoxia  Hypervolemia, unspecified hypervolemia type    Clinical Impression: 1. Peripheral edema  2. Shortness of breath   3. Hypoxia   4. Hypervolemia, unspecified hypervolemia type     Disposition: We will go to dialysis with nephrology and then come back to the emergency department for reassessment to determine if he is safe for discharge home.  This note was prepared with assistance of Systems analyst. Occasional wrong-word or sound-a-like substitutions may have occurred due to the inherent limitations of voice recognition software.      Mehek Grega, Gwenyth Allegra, MD 07/18/20 1536

## 2020-07-18 NOTE — Progress Notes (Addendum)
Brief nephrology observation note:  ER consulted [place] to arrange hemodialysis.  Patient was seen and examined in ER. 55 year old male with history of ESRD on HD TTS at [facility] presented with shortness of breath and weight gain in the setting of missing dialysis yesterday.  He is saturating 100% in 2 L and potassium level of 5.4. Blood pressure is acceptable and he looks comfortable.  On exam he has bilateral lower extremity edema and consistent with fluid overload.  Plan: Hemodialysis today.  Already discussed with the HD nurse.  Try to UF around 4 L if tolerated by BP.  After dialysis, patient will return to ER for further assessment.  He will likely be discharged home afterward.  I discussed with the patient and his mother that he should continue his regular dialysis tomorrow at outpatient.  Katheran James, MD [facility].

## 2020-07-18 NOTE — ED Triage Notes (Signed)
Patient presents to the ED by EMS with c/o bilateral lower extremity edema. Dialysis patient who is attempting to change his schedule to MWF. Only resp distress is with exertion.

## 2020-07-18 NOTE — ED Notes (Signed)
RN assuming care. Pt already gone to dialysis.

## 2020-07-18 NOTE — ED Notes (Signed)
Patient transported to X-ray 

## 2020-07-18 NOTE — ED Notes (Signed)
Pt to HD

## 2020-07-19 DIAGNOSIS — M86172 Other acute osteomyelitis, left ankle and foot: Secondary | ICD-10-CM | POA: Diagnosis not present

## 2020-07-19 DIAGNOSIS — N2581 Secondary hyperparathyroidism of renal origin: Secondary | ICD-10-CM | POA: Diagnosis not present

## 2020-07-19 DIAGNOSIS — Z992 Dependence on renal dialysis: Secondary | ICD-10-CM | POA: Diagnosis not present

## 2020-07-19 DIAGNOSIS — E785 Hyperlipidemia, unspecified: Secondary | ICD-10-CM | POA: Diagnosis not present

## 2020-07-19 DIAGNOSIS — D631 Anemia in chronic kidney disease: Secondary | ICD-10-CM | POA: Diagnosis not present

## 2020-07-19 DIAGNOSIS — Z23 Encounter for immunization: Secondary | ICD-10-CM | POA: Diagnosis not present

## 2020-07-19 DIAGNOSIS — N186 End stage renal disease: Secondary | ICD-10-CM | POA: Diagnosis not present

## 2020-07-19 NOTE — ED Notes (Signed)
Patient verbalizes understanding of discharge instructions. Opportunity for questioning and answers were provided. Armband removed by staff, pt discharged from ED via wheelchair with mother.

## 2020-07-20 LAB — HEPATITIS B SURFACE ANTIBODY, QUANTITATIVE: Hep B S AB Quant (Post): 101.4 m[IU]/mL (ref >9.9)

## 2020-07-21 ENCOUNTER — Emergency Department (HOSPITAL_COMMUNITY)
Admission: EM | Admit: 2020-07-21 | Discharge: 2020-07-21 | Disposition: A | Discharge status: Home / Self Care | Payer: Medicare Other | Attending: Emergency Medicine | Admitting: Emergency Medicine

## 2020-07-21 ENCOUNTER — Other Ambulatory Visit: Payer: Self-pay

## 2020-07-21 ENCOUNTER — Encounter (HOSPITAL_BASED_OUTPATIENT_CLINIC_OR_DEPARTMENT_OTHER): Payer: Medicare Other | Admitting: Internal Medicine

## 2020-07-21 ENCOUNTER — Encounter (HOSPITAL_COMMUNITY): Payer: Self-pay | Admitting: Pharmacy Technician

## 2020-07-21 ENCOUNTER — Emergency Department (HOSPITAL_COMMUNITY): Payer: Medicare Other

## 2020-07-21 DIAGNOSIS — Z992 Dependence on renal dialysis: Secondary | ICD-10-CM | POA: Diagnosis not present

## 2020-07-21 DIAGNOSIS — N189 Chronic kidney disease, unspecified: Secondary | ICD-10-CM

## 2020-07-21 DIAGNOSIS — N185 Chronic kidney disease, stage 5: Secondary | ICD-10-CM | POA: Diagnosis not present

## 2020-07-21 DIAGNOSIS — J811 Chronic pulmonary edema: Secondary | ICD-10-CM | POA: Diagnosis not present

## 2020-07-21 DIAGNOSIS — I5022 Chronic systolic (congestive) heart failure: Secondary | ICD-10-CM | POA: Diagnosis not present

## 2020-07-21 DIAGNOSIS — E1122 Type 2 diabetes mellitus with diabetic chronic kidney disease: Secondary | ICD-10-CM | POA: Diagnosis not present

## 2020-07-21 DIAGNOSIS — R609 Edema, unspecified: Secondary | ICD-10-CM

## 2020-07-21 DIAGNOSIS — I509 Heart failure, unspecified: Secondary | ICD-10-CM | POA: Diagnosis not present

## 2020-07-21 DIAGNOSIS — I132 Hypertensive heart and chronic kidney disease with heart failure and with stage 5 chronic kidney disease, or end stage renal disease: Secondary | ICD-10-CM | POA: Diagnosis not present

## 2020-07-21 DIAGNOSIS — E1140 Type 2 diabetes mellitus with diabetic neuropathy, unspecified: Secondary | ICD-10-CM | POA: Insufficient documentation

## 2020-07-21 DIAGNOSIS — Z794 Long term (current) use of insulin: Secondary | ICD-10-CM | POA: Diagnosis not present

## 2020-07-21 DIAGNOSIS — R9431 Abnormal electrocardiogram [ECG] [EKG]: Secondary | ICD-10-CM | POA: Diagnosis not present

## 2020-07-21 DIAGNOSIS — I517 Cardiomegaly: Secondary | ICD-10-CM | POA: Diagnosis not present

## 2020-07-21 DIAGNOSIS — I120 Hypertensive chronic kidney disease with stage 5 chronic kidney disease or end stage renal disease: Secondary | ICD-10-CM | POA: Diagnosis not present

## 2020-07-21 DIAGNOSIS — R600 Localized edema: Secondary | ICD-10-CM | POA: Insufficient documentation

## 2020-07-21 DIAGNOSIS — J9811 Atelectasis: Secondary | ICD-10-CM | POA: Diagnosis not present

## 2020-07-21 DIAGNOSIS — R918 Other nonspecific abnormal finding of lung field: Secondary | ICD-10-CM | POA: Diagnosis not present

## 2020-07-21 DIAGNOSIS — N186 End stage renal disease: Secondary | ICD-10-CM | POA: Diagnosis not present

## 2020-07-21 DIAGNOSIS — Z7982 Long term (current) use of aspirin: Secondary | ICD-10-CM | POA: Diagnosis not present

## 2020-07-21 LAB — BASIC METABOLIC PANEL
Anion gap: 12 (ref 5–15)
BUN: 26 mg/dL — ABNORMAL HIGH (ref 6–20)
CO2: 25 mmol/L (ref 22–32)
Calcium: 8.3 mg/dL — ABNORMAL LOW (ref 8.9–10.3)
Chloride: 98 mmol/L (ref 98–111)
Creatinine, Ser: 5.26 mg/dL — ABNORMAL HIGH (ref 0.61–1.24)
GFR, Estimated: 12 mL/min — ABNORMAL LOW (ref >60)
Glucose, Bld: 148 mg/dL — ABNORMAL HIGH (ref 70–99)
Potassium: 4.6 mmol/L (ref 3.5–5.1)
Sodium: 135 mmol/L (ref 135–145)

## 2020-07-21 LAB — CBC
HCT: 35.8 % — ABNORMAL LOW (ref 39.0–52.0)
Hemoglobin: 10.2 g/dL — ABNORMAL LOW (ref 13.0–17.0)
MCH: 28.9 pg (ref 26.0–34.0)
MCHC: 28.5 g/dL — ABNORMAL LOW (ref 30.0–36.0)
MCV: 101.4 fL — ABNORMAL HIGH (ref 80.0–100.0)
Platelets: 374 10*3/uL (ref 150–400)
RBC: 3.53 MIL/uL — ABNORMAL LOW (ref 4.22–5.81)
RDW: 17.1 % — ABNORMAL HIGH (ref 11.5–15.5)
WBC: 7.4 10*3/uL (ref 4.0–10.5)
nRBC: 0 % (ref 0.0–0.2)

## 2020-07-21 LAB — BRAIN NATRIURETIC PEPTIDE: B Natriuretic Peptide: 4425.6 pg/mL — ABNORMAL HIGH (ref 0.0–100.0)

## 2020-07-21 NOTE — ED Triage Notes (Signed)
Pt here with increased edema to BLE extending up to his abdomen. Pt states he is not on a diuretic. Reports hx of heart failure. Pt denies chest pain/shob at this time.

## 2020-07-21 NOTE — Discharge Instructions (Addendum)
Call your primary care doctor or specialist as discussed in the next 2-3 days.   Return immediately back to the ER if:  Your symptoms worsen within the next 12-24 hours. You develop new symptoms such as new fevers, persistent vomiting, new pain, shortness of breath, or new weakness or numbness, or if you have any other concerns.  

## 2020-07-21 NOTE — ED Provider Notes (Signed)
[facility] EMERGENCY DEPARTMENT Provider Note   CSN: [id number] Arrival date & time: 07/21/20  1058     History Chief Complaint  Patient presents with  . Leg Swelling    [name] [name] is a 55 y.o. male.  Patient returns to the ER with recurrent bilateral leg swelling.  He was just seen here 2 days ago ultimately had dialysis in the ER and discharged home after improvement.  He states his legs are swollen again.  He called his dialysis center told to go to the ER for another dialysis session if he felt like he was fluid overloaded.  Patient otherwise denies any headache no chest pain abdominal pain no fever no cough no vomiting no diarrhea.        Past Medical History:  Diagnosis Date  . Anal infection    posterior anal canal  . Anemia, chronic renal failure   . Atrophic kidney    BILATERAL  . DM type 2 causing ESRD [facility])    Nephrologist-- dr [name])--  on hemodialysis since June 2012 at  [name]  TTS  . Hemodialysis patient [facility])    at [facility] on Tues/ Thur/Sat/schedule  . Hemorrhoids   . Hepatitis B antibody positive   . History of pleural effusion    bilateral  . Hyperparathyroidism, secondary renal ([place])   . Hypertension   . Ischemic cardiomyopathy    per echo 07-01-2014  ef 45%  . LAFB (left anterior fascicular block)   . Peripheral neuropathy   . Systolic and diastolic CHF, chronic ([place])    CARDIOLOGIST-  DR Daneen Schick ([place])  AND DR [name] ([name])    Patient Active Problem List   Diagnosis Date Noted  . Osteomyelitis due to type 2 diabetes mellitus ([place]) 05/12/2020  . Diabetic foot ulcer ([place]) 05/12/2020  . HLD (hyperlipidemia) 02/08/2020  . Secondary hyperparathyroidism ([place]) 02/08/2020  . Secondary anemia 02/08/2020  . Retinal detachment 02/08/2020  . Medication monitoring encounter 07/25/2019  . Penile mass 07/16/2019  . BMI 40.0-44.9, adult ([place])  12/20/2018  . Wound infection 09/15/2018  . 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 Acute GI bleeding 10/13/2017  . ESRD on hemodialysis ([place]) 10/13/2017  . Embolism, arterial ([place]) 10/12/2017  . Ischemia of finger 10-21-17  . Deceased-donor kidney transplant 06/04/2016  . 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 Diabetes mellitus type 2, uncontrolled ([place]) 05/03/2014  . Non-ischemic cardiomyopathy ([place]) 05/03/2014  . Type II diabetes mellitus with renal manifestations ([place]) 02/01/2011  . Mechanical complication of other vascular device, implant, and graft 02/01/2011  . HYPERCHOLESTEROLEMIA 08/19/2009  . Essential hypertension 08/19/2009  . Chronic systolic heart failure ([place]) 08/19/2009  . CKD (chronic kidney disease) stage V requiring chronic dialysis ([place]) 08/19/2009  . Depression 08/19/2009    Past Surgical History:  Procedure Laterality Date  . APPENDECTOMY  09-12-2004   laparotomy w/ drainage peritinitis  . AV FISTULA PLACEMENT  02-27-2010   right forearm (RADIOCEPHALIC)  . AV FISTULA REPAIR  10-30-2010  . BONE BIOPSY Left 05/14/2020   Procedure: BONE BIOPSY;  Surgeon: Trula Slade, DPM;  Location: [facility];  Service: Podiatry;  Laterality: Left;  . CARDIOVASCULAR STRESS TEST  10-29-2011   dr Daneen Schick   Low risk scan;  mild perfusion defect seen in the basal inferoseptal, basal inferior and mid inferior regions consistent with an infarct/scar and/or overlying attenuation/  mild to moderate global LVSF,  ef 40-45%  . DOBUTAMINE STRESS ECHO  07-23-2012   [name]   abnormal ;  at rest estimated lvef 25-30% and global severe LV hypokinesis ;  no cp during stress and achieved 85% maxium predicted heart rate;  negative stress ECG for inducible ischemia;  estimated lvef with stress 35-40%;  augmentation of wall segments consistant with cardiomyopathy and differential fibrosis  . FISTULOTOMY N/A 03/26/2015   Procedure: FISTULOTOMY;   Surgeon: [name], MD;  Location: [facility];  Service: General;  Laterality: N/A;  . GRAFT APPLICATION Left 0000000   Procedure: GRAFT APPLICATION;  Surgeon: Trula Slade, DPM;  Location: [facility];  Service: Podiatry;  Laterality: Left;  [name] GRAFT APPLICATION Bilateral 0000000   Procedure: GRAFT APPLICATION;  Surgeon: Trula Slade, DPM;  Location: [facility];  Service: Podiatry;  Laterality: Bilateral;  . INCISION AND DRAINAGE ABSCESS N/A 03/26/2015   Procedure: ANAL INCISION AND DRAINAGE;  Surgeon: [name], MD;  Location: [facility];  Service: General;  Laterality: N/A;  . RETINAL DETACHMENT SURGERY Left 2011   incomplete repair/ needs eye drops to keep pressure down  . TEE WITHOUT CARDIOVERSION N/A 10/17/2017   Procedure: TRANSESOPHAGEAL ECHOCARDIOGRAM (TEE);  Surgeon: Josue Hector, MD;  Location: [facility];  Service: Cardiovascular;  Laterality: N/A;  . TRANSTHORACIC ECHOCARDIOGRAM  07-01-2014    done at [facility]   grade 1 diastolic dysfunction,  ef 45%/  trace TR and PR  . WOUND DEBRIDEMENT Left 05/14/2020   Procedure: DEBRIDEMENT WOUND;  Surgeon: Trula Slade, DPM;  Location: [facility];  Service: Podiatry;  Laterality: Left;  . WOUND DEBRIDEMENT Bilateral 06/04/2020   Procedure: DEBRIDEMENT WOUND BOTH FEET;  Surgeon: Trula Slade, DPM;  Location: [facility];  Service: Podiatry;  Laterality: Bilateral;       Family History  Problem Relation Age of Onset  . Diabetes Other     Social History   Tobacco Use  . Smoking status: Never Smoker  . Smokeless tobacco: Never Used  Vaping Use  . Vaping Use: Never used  Substance Use Topics  . Alcohol use: No  . Drug use: No    Home Medications Prior to Admission medications   Medication Sig Start Date End Date Taking? Authorizing Provider  aspirin 325 MG tablet Take 325 mg by mouth every evening.    [provider]  atorvastatin (LIPITOR) 10 MG tablet Take  10 mg by mouth every evening.    [provider]  calcium acetate (PHOSLO) 667 MG capsule Take 667 mg by mouth daily. 06/02/10   [provider]  collagenase (SANTYL) ointment Apply 1 application topically daily. 06/23/20   McDonald, Stephan Minister, DPM  Continuous Blood Gluc Sensor (FREESTYLE LIBRE 14 DAY SENSOR) MISC See admin instructions. 04/08/20   [provider]  cycloSPORINE, PF, (CEQUA) 0.09 % SOLN Place 1 drop into both eyes daily.    [provider]  docusate sodium (COLACE) 100 MG capsule Take 100 mg by mouth daily as needed for mild constipation. 07/16/20   [provider]  febuxostat (ULORIC) 40 MG tablet Take 40 mg by mouth every evening.    [provider]  gabapentin (NEURONTIN) 300 MG capsule Take 300 mg by mouth daily as needed (nerve pain). 05/09/15   [provider]  Insulin Disposable Pump (OMNIPOD DASH 5 PACK PODS) MISC Inject into the skin. Use with Humalog 08/28/19   [provider]  insulin  lispro (HUMALOG) 100 UNIT/ML injection Inject 0-3 Units into the skin 3 (three) times daily before meals.    [provider]  lidocaine (XYLOCAINE) 5 % ointment Apply 1 application topically 2 (two) times daily as needed (finger pain). 07/14/19   [provider]  Magnesium Hydroxide (DULCOLAX PO) Take 10 mg by mouth daily.    [provider]  midodrine (PROAMATINE) 5 MG tablet Take 5 mg by mouth Every Tuesday,Thursday,and Saturday with dialysis.    [provider]  Multiple Vitamin (MULTIVITAMIN WITH MINERALS) TABS tablet Take 1 tablet by mouth every evening.    [provider]  mupirocin ointment (BACTROBAN) 2 % Apply 1 application topically daily as needed (itching). 07/16/20   [provider]  ondansetron (ZOFRAN) 4 MG tablet Take 4 mg by mouth 3 (three) times daily as needed for nausea or vomiting. 06/27/19   [provider]  oxyCODONE (OXY IR/ROXICODONE) 5 MG immediate  release tablet  07/16/20   [provider]  Oxycodone HCl 10 MG TABS Take 10 mg by mouth 5 (five) times daily. 07/16/20   [provider]  Polyethyl Glycol-Propyl Glycol (SYSTANE) 0.4-0.3 % SOLN Apply 1 drop to eye daily as needed (dry eyes).    [provider]  sucroferric oxyhydroxide (VELPHORO) 500 MG chewable tablet Chew 500 mg by mouth daily.    [provider]  triamcinolone cream (KENALOG) 0.1 % Apply 1 application topically daily as needed (itching). 07/14/19   [provider]    Allergies    Patient has no known allergies.  Review of Systems   Review of Systems  Constitutional: Negative for fever.  HENT: Negative for ear pain and sore throat.   Eyes: Negative for pain.  Respiratory: Negative for cough.   Cardiovascular: Negative for chest pain.  Gastrointestinal: Negative for abdominal pain.  Genitourinary: Negative for flank pain.  Musculoskeletal: Negative for back pain.  Skin: Negative for color change and rash.  Neurological: Negative for syncope.  All other systems reviewed and are negative.   Physical Exam Updated Vital Signs BP 129/83   Pulse 98   Temp 99 F (37.2 C) (Oral)   Resp 17   SpO2 100%   Physical Exam Constitutional:      General: He is not in acute distress.    Appearance: He is well-developed.  HENT:     Head: Normocephalic.     Nose: Nose normal.  Eyes:     Extraocular Movements: Extraocular movements intact.  Cardiovascular:     Rate and Rhythm: Normal rate.  Pulmonary:     Effort: Pulmonary effort is normal.  Musculoskeletal:     Right lower leg: Edema present.     Left lower leg: Edema present.  Skin:    Coloration: Skin is not jaundiced.  Neurological:     Mental Status: He is alert. Mental status is at baseline.     ED Results / Procedures / Treatments   Labs (all labs ordered are listed, but only abnormal results are displayed) Labs Reviewed  BASIC METABOLIC PANEL - Abnormal;  Notable for the following components:      Result Value   Glucose, Bld 148 (*)    BUN 26 (*)    Creatinine, Ser 5.26 (*)    Calcium 8.3 (*)    GFR, Estimated 12 (*)    All other components within normal limits  CBC - Abnormal; Notable for the following components:   RBC 3.53 (*)    Hemoglobin 10.2 (*)  HCT 35.8 (*)    MCV 101.4 (*)    MCHC 28.5 (*)    RDW 17.1 (*)    All other components within normal limits  BRAIN NATRIURETIC PEPTIDE    EKG EKG Interpretation  Date/Time:  Monday Jul 21 2020 11:15:42 EDT Ventricular Rate:  103 PR Interval:  144 QRS Duration: 76 QT Interval:  374 QTC Calculation: 489 R Axis:   131 Text Interpretation: Sinus tachycardia Possible Right ventricular hypertrophy Cannot rule out Anterior infarct , age undetermined Abnormal ECG Confirmed by Thamas Jaegers ([id number]) on 07/21/2020 12:32:09 PM   Radiology DG Chest 2 View  Result Date: 07/21/2020 CLINICAL DATA:  heart failure EXAM: CHEST - 2 VIEW COMPARISON:  07/18/2020 FINDINGS: Unchanged, enlarged cardiac silhouette. There is increased right basilar opacities. No pleural effusion or pneumothorax. Mild increased interstitial opacities. IMPRESSION: Cardiomegaly with mild pulmonary edema. Increased right basilar opacity which could be atelectasis, alveolar edema, or developing infection. Follow-up radiograph is recommended. Electronically Signed   By: Maurine Simmering   On: 07/21/2020 11:57    Procedures Procedures   Medications Ordered in ED Medications - No data to display  ED Course  I have reviewed the triage vital signs and the nursing notes.  Pertinent labs & imaging results that were available during my care of the patient were reviewed by me and considered in my medical decision making (see chart for details).    MDM Rules/Calculators/A&P                          Patient presents with normal vital signs mildly tachycardic.  Otherwise bilateral lower extremities are 2+ pitting edema and equal.  No  erythema or abnormal warmth palpated.  Labs consistent with his history of end-stage kidney disease.  Patient inquired about receiving dialysis while here, I told him I would call the nephrologist to help facilitate this but that it would take a few hours.  Patient states that he feels fine and prefers to be dialyzed at outpatient facility tomorrow as previously scheduled.  I advised immediate return if he has worsening symptoms otherwise discharged home in stable condition.  Final Clinical Impression(s) / ED Diagnoses Final diagnoses:  Peripheral edema  Chronic kidney disease, unspecified CKD stage    Rx / DC Orders ED Discharge Orders    None       Luna Fuse, MD 07/21/20 1323

## 2020-07-21 NOTE — ED Notes (Signed)
Waiting for [facility] to home. Pulled to hallway bed. Discharged.

## 2020-07-22 DIAGNOSIS — D509 Iron deficiency anemia, unspecified: Secondary | ICD-10-CM | POA: Diagnosis not present

## 2020-07-22 DIAGNOSIS — A419 Sepsis, unspecified organism: Secondary | ICD-10-CM | POA: Diagnosis not present

## 2020-07-22 DIAGNOSIS — D631 Anemia in chronic kidney disease: Secondary | ICD-10-CM | POA: Diagnosis not present

## 2020-07-22 DIAGNOSIS — N186 End stage renal disease: Secondary | ICD-10-CM | POA: Diagnosis not present

## 2020-07-22 DIAGNOSIS — N2581 Secondary hyperparathyroidism of renal origin: Secondary | ICD-10-CM | POA: Diagnosis not present

## 2020-07-23 ENCOUNTER — Encounter (HOSPITAL_BASED_OUTPATIENT_CLINIC_OR_DEPARTMENT_OTHER): Payer: Medicare Other | Admitting: Physician Assistant

## 2020-07-24 DIAGNOSIS — D509 Iron deficiency anemia, unspecified: Secondary | ICD-10-CM | POA: Diagnosis not present

## 2020-07-24 DIAGNOSIS — A419 Sepsis, unspecified organism: Secondary | ICD-10-CM | POA: Diagnosis not present

## 2020-07-24 DIAGNOSIS — N186 End stage renal disease: Secondary | ICD-10-CM | POA: Diagnosis not present

## 2020-07-24 DIAGNOSIS — E119 Type 2 diabetes mellitus without complications: Secondary | ICD-10-CM | POA: Diagnosis not present

## 2020-07-24 DIAGNOSIS — N2581 Secondary hyperparathyroidism of renal origin: Secondary | ICD-10-CM | POA: Diagnosis not present

## 2020-07-24 DIAGNOSIS — D631 Anemia in chronic kidney disease: Secondary | ICD-10-CM | POA: Diagnosis not present

## 2020-07-25 ENCOUNTER — Ambulatory Visit (INDEPENDENT_AMBULATORY_CARE_PROVIDER_SITE_OTHER): Payer: Medicare Other | Admitting: Infectious Diseases

## 2020-07-25 ENCOUNTER — Other Ambulatory Visit: Payer: Self-pay

## 2020-07-25 VITALS — BP 130/76 | HR 68 | Temp 98.6°F

## 2020-07-25 DIAGNOSIS — L97429 Non-pressure chronic ulcer of left heel and midfoot with unspecified severity: Secondary | ICD-10-CM | POA: Diagnosis not present

## 2020-07-25 DIAGNOSIS — I7300 Raynaud's syndrome without gangrene: Secondary | ICD-10-CM | POA: Diagnosis not present

## 2020-07-25 DIAGNOSIS — Z4781 Encounter for orthopedic aftercare following surgical amputation: Secondary | ICD-10-CM | POA: Diagnosis not present

## 2020-07-25 DIAGNOSIS — E08621 Diabetes mellitus due to underlying condition with foot ulcer: Secondary | ICD-10-CM

## 2020-07-25 DIAGNOSIS — E1169 Type 2 diabetes mellitus with other specified complication: Secondary | ICD-10-CM | POA: Diagnosis not present

## 2020-07-25 DIAGNOSIS — E11621 Type 2 diabetes mellitus with foot ulcer: Secondary | ICD-10-CM | POA: Diagnosis not present

## 2020-07-25 DIAGNOSIS — M869 Osteomyelitis, unspecified: Secondary | ICD-10-CM | POA: Diagnosis not present

## 2020-07-25 DIAGNOSIS — E1152 Type 2 diabetes mellitus with diabetic peripheral angiopathy with gangrene: Secondary | ICD-10-CM | POA: Diagnosis not present

## 2020-07-25 DIAGNOSIS — L97419 Non-pressure chronic ulcer of right heel and midfoot with unspecified severity: Secondary | ICD-10-CM | POA: Diagnosis not present

## 2020-07-26 DIAGNOSIS — N186 End stage renal disease: Secondary | ICD-10-CM | POA: Diagnosis not present

## 2020-07-26 DIAGNOSIS — D631 Anemia in chronic kidney disease: Secondary | ICD-10-CM | POA: Diagnosis not present

## 2020-07-26 DIAGNOSIS — A419 Sepsis, unspecified organism: Secondary | ICD-10-CM | POA: Diagnosis not present

## 2020-07-26 DIAGNOSIS — D509 Iron deficiency anemia, unspecified: Secondary | ICD-10-CM | POA: Diagnosis not present

## 2020-07-26 DIAGNOSIS — N2581 Secondary hyperparathyroidism of renal origin: Secondary | ICD-10-CM | POA: Diagnosis not present

## 2020-07-26 NOTE — Progress Notes (Signed)
[facility]                                                             [place], [name], [place], [address]                                                                  Phn. [phone]; Fax: [phone]                                                                             Date: 07/26/20  Reason for Follow Up- Left heel ulcer/osteomyelitis   Assessment and Plan: Problem List Items Addressed This Visit      Endocrine   Osteomyelitis due to type 2 diabetes mellitus ([name] [name]) - Primary   Diabetic foot ulcer ([name] [name])     Left heel DFU/osteomyelitis  S/p debridement and bone biopsy on 2/23. OR cx with Actinomyces  ABI WNL.  Vascular SX consulted with no concerns of poor blood flow to the Lower extremities  Rt heel DFU  DM ESRSD on HD  Has already stopped Amoxicillin 3 weeks ago and has been following wound care  Follow up with wound care Monitor off antibiotics  Fu as needed   All questions and concerns were discussed and addressed. Patient verbalized understanding of the plan. ____________________________________________________________________________________________________________________ 04/26/20 [name] [name] a 55 y.o.malewithPMHsignificant forfailed renal transplant who isESRD on HDvia RT arm AVF, DM 2, gout peripheral neuropathy, CHF who presented to the ED on 2/21 with worsening pain in his heel ulcers.He was seen by podiatryon day of ED visit,undergone nail debridement and was advised to to to the ED given DFU with increasing pain and concerns for osteomyelitis. Denies any fevers, chill, sweats. Denies any n/v/d/abdominal pain and denies any GU symptoms. Recent MRI Left heel on 2/16 with > early OM.  He underwentdebridement and bone biopsy on 2/23by Podiatry, Path sent ( pending),. OR cx with no organisms in gram stain and no growth in cx <24 hrs.  Podiatry recommending treating as osteo given the depth of the wound.Vascular SX consulted with no concerns of poor blood flow to the Lower extremities. ABI WNL.  Patient was discharged from the hospital on 2/25 with a plan to complete Vancomycin with HD, cefepime and metronidazole for 6 weeks. However, OR cultures 2/23 resulted actinomyces on 3/2. Patient was taken to OR again on  3/16 for debridement of both foot wound and bilateral graft application. Antibiotics was changed to Vancomycin with HD and amoxicillin with discontinuation of cefepime and metronidazole on 3/24.   06/20/20 Doing well, no complaints. Has been getting Vancomycin wiuth HD and taking PO amoxicillin as prescribed. No issues with antibiotics. He feels his wounds are healing well. He is being closely followed by Podiatry.  He will follow with me in 2 weeks as in person visit.   07/26/20 Here for fu of Left Heel DFU. Patient had Excisional debridement of Left heel ulcer on 4/4 office visit with Podiatry followed by another debridement with wound care center on 4/12. Patient was also recently seen in the ED on 5/2 in the setting of SOB/volume overload in the setting of missing HD. He was seen again in the ED on 5/2 for similar reasons.  He says he has been not taking Amoxicllin for almost 3 weeks to me. He told me Dr Jacqualyn Posey said he does not need more antibiotics. He was also asking me why this appointment was made to which I said I wanted to see how his ulcers are doing. I did not know he is no more on antibiotics. His mother was with him today and they did not want the wound dressing to be opened as it was nicely dressed by his [facility] yesterday. He has been following with wound care center and ulcer has been thought to be doing well. He will continue to follow up with WOC. No complaints today   ROS:  Denies fevers, chills and sweats  Denies nausea/vomiting/diarrhea and abdominal pain Denies swelling /drainage from the heel  ulcers   Past Medical History:  Diagnosis Date  . Anal infection    posterior anal canal  . Anemia, chronic renal failure   . Atrophic kidney    BILATERAL  . DM type 2 causing ESRD [facility])    Nephrologist-- dr [name] [facility])--  on hemodialysis since June 2012 at  [name]  TTS  . Hemodialysis patient [facility])    at [facility] on Tues/ Thur/Sat/schedule  . Hemorrhoids   . Hepatitis B antibody positive   . History of pleural effusion    bilateral  . Hyperparathyroidism, secondary renal ([place])   . Hypertension   . Ischemic cardiomyopathy    per echo 07-01-2014  ef 45%  . LAFB (left anterior fascicular block)   . Peripheral neuropathy   . Systolic and diastolic CHF, chronic ([place])    CARDIOLOGIST-  DR Daneen Schick ([place])  AND DR [name] ([name])   Past Surgical History:  Procedure Laterality Date  . APPENDECTOMY  09-12-2004   laparotomy w/ drainage peritinitis  . AV FISTULA PLACEMENT  02-27-2010   right forearm (RADIOCEPHALIC)  . AV FISTULA REPAIR  10-30-2010  . BONE BIOPSY Left 05/14/2020   Procedure: BONE BIOPSY;  Surgeon: Trula Slade, DPM;  Location: [facility];  Service: Podiatry;  Laterality: Left;  . CARDIOVASCULAR STRESS TEST  10-29-2011   dr Daneen Schick   Low risk scan;  mild perfusion defect seen in the basal inferoseptal, basal inferior and mid inferior regions consistent with an infarct/scar and/or overlying attenuation/  mild to moderate global LVSF,  ef 40-45%  . DOBUTAMINE STRESS ECHO  07-23-2012   [name]   abnormal ;  at rest estimated lvef 25-30% and global severe LV hypokinesis ;  no cp during stress and achieved 85% maxium predicted heart rate;  negative stress ECG for inducible ischemia;  estimated lvef with stress 35-40%;  augmentation of wall segments consistant with cardiomyopathy and differential fibrosis  . FISTULOTOMY N/A 03/26/2015   Procedure: FISTULOTOMY;  Surgeon: [name], MD;  Location:  [facility];  Service: General;  Laterality: N/A;  . GRAFT APPLICATION Left 0000000   Procedure: GRAFT APPLICATION;  Surgeon: Trula Slade, DPM;  Location: [place];  Service: Podiatry;  Laterality: Left;  [name] GRAFT APPLICATION Bilateral 0000000   Procedure: GRAFT APPLICATION;  Surgeon: Trula Slade, DPM;  Location: [facility];  Service: Podiatry;  Laterality: Bilateral;  . INCISION AND DRAINAGE ABSCESS N/A 03/26/2015   Procedure: ANAL INCISION AND DRAINAGE;  Surgeon: [name], MD;  Location: [facility];  Service: General;  Laterality: N/A;  . RETINAL DETACHMENT SURGERY Left 2011   incomplete repair/ needs eye drops to keep pressure down  . TEE WITHOUT CARDIOVERSION N/A 10/17/2017   Procedure: TRANSESOPHAGEAL ECHOCARDIOGRAM (TEE);  Surgeon: Josue Hector, MD;  Location: [facility] ENDOSCOPY;  Service: Cardiovascular;  Laterality: N/A;  . TRANSTHORACIC ECHOCARDIOGRAM  07-01-2014    done at [facility]   grade 1 diastolic dysfunction,  ef 45%/  trace TR and PR  . WOUND DEBRIDEMENT Left 05/14/2020   Procedure: DEBRIDEMENT WOUND;  Surgeon: Trula Slade, DPM;  Location: [place];  Service: Podiatry;  Laterality: Left;  . WOUND DEBRIDEMENT Bilateral 06/04/2020   Procedure: DEBRIDEMENT WOUND BOTH FEET;  Surgeon: Trula Slade, DPM;  Location: [facility];  Service: Podiatry;  Laterality: Bilateral;   Current Outpatient Medications on File Prior to Visit  Medication Sig Dispense Refill  . aspirin 325 MG tablet Take 325 mg by mouth every evening.    [name] atorvastatin (LIPITOR) 10 MG tablet Take 10 mg by mouth every evening.    . calcium acetate (PHOSLO) 667 MG capsule Take 667 mg by mouth daily.    . collagenase (SANTYL) ointment Apply 1 application topically daily. 90 g 1  . Continuous Blood Gluc Sensor (FREESTYLE LIBRE 14 DAY SENSOR) MISC See admin instructions.    . cycloSPORINE, PF, (CEQUA) 0.09 % SOLN Place 1 drop into both eyes daily.    [name] docusate  sodium (COLACE) 100 MG capsule Take 100 mg by mouth daily as needed for mild constipation.    . febuxostat (ULORIC) 40 MG tablet Take 40 mg by mouth every evening.    . gabapentin (NEURONTIN) 300 MG capsule Take 300 mg by mouth daily as needed (nerve pain).  3  . Insulin Disposable Pump (OMNIPOD DASH 5 PACK PODS) MISC Inject into the skin. Use with Humalog    . insulin lispro (HUMALOG) 100 UNIT/ML injection Inject 0-3 Units into the skin 3 (three) times daily before meals.    . lidocaine (XYLOCAINE) 5 % ointment Apply 1 application topically 2 (two) times daily as needed (finger pain).    . Magnesium Hydroxide (DULCOLAX PO) Take 10 mg by mouth daily.    . midodrine (PROAMATINE) 5 MG tablet Take 5 mg by mouth Every Tuesday,Thursday,and Saturday with dialysis.    . Multiple Vitamin (MULTIVITAMIN WITH MINERALS) TABS tablet Take 1 tablet by mouth every evening.    . mupirocin ointment (BACTROBAN) 2 % Apply 1 application topically daily as needed (itching).    . ondansetron (ZOFRAN) 4 MG tablet Take 4 mg by mouth 3 (three) times daily as needed for nausea or vomiting.    . Oxycodone HCl 10 MG TABS Take 10 mg by mouth 5 (five) times daily.    Vladimir Faster Glycol-Propyl Glycol (SYSTANE) 0.4-0.3 % SOLN Apply 1 drop to eye daily as needed (dry eyes).    . sucroferric oxyhydroxide (VELPHORO) 500 MG chewable tablet Chew 500 mg by mouth daily.    [name] triamcinolone cream (KENALOG) 0.1 % Apply 1 application topically daily as needed (itching).    [name] oxyCODONE (OXY IR/ROXICODONE) 5 MG  immediate release tablet  (Patient not taking: No sig reported)     No current facility-administered medications on file prior to visit.   No Known Allergies  Social History   Socioeconomic History  . Marital status: Single    Spouse name: Not on file  . Number of children: Not on file  . Years of education: Not on file  . Highest education level: Not on file  Occupational History  . Occupation: Retired  Tobacco Use  .  Smoking status: Never Smoker  . Smokeless tobacco: Never Used  Vaping Use  . Vaping Use: Never used  Substance and Sexual Activity  . Alcohol use: No  . Drug use: No  . Sexual activity: Not Currently  Other Topics Concern  . Not on file  Social History Narrative  . Not on file   Social Determinants of Health   Financial Resource Strain: Not on file  Food Insecurity: Not on file  Transportation Needs: Not on file  Physical Activity: Not on file  Stress: Not on file  Social Connections: Not on file  Intimate Partner Violence: Not on file    Vitals BP 130/76   Pulse 68   Temp 98.6 F (37 C) (Oral)    Examination  General - not in acute distress, comfortably sitting in wheelchair HEENT - no pallor and no icterus Chest - b/l clear air entry, no additional sounds CVS- Normal s1s2, RRR Abdomen - Soft, Non tender Ext- RT and Left foot bandaged  Neuro: grossly non focal  Psych : calm and cooperative   Recent labs CBC Latest Ref Rng & Units 07/21/2020 07/18/2020 07/18/2020  WBC 4.0 - 10.5 K/uL 7.4 9.4 11.0(H)  Hemoglobin 13.0 - 17.0 g/dL 10.2(L) 9.5(L) 9.6(L)  Hematocrit 39.0 - 52.0 % 35.8(L) 32.0(L) 32.1(L)  Platelets 150 - 400 K/uL 374 293 319   CMP Latest Ref Rng & Units 07/21/2020 07/18/2020 07/18/2020  Glucose 70 - 99 mg/dL 148(H) 171(H) 291(H)  BUN 6 - 20 mg/dL 26(H) 49(H) 47(H)  Creatinine 0.61 - 1.24 mg/dL 5.26(H) 8.16(H) 8.09(H)  Sodium 135 - 145 mmol/L 135 135 135  Potassium 3.5 - 5.1 mmol/L 4.6 5.4(H) 5.4(H)  Chloride 98 - 111 mmol/L 98 98 97(L)  CO2 22 - 32 mmol/L '25 23 23  '$ Calcium 8.9 - 10.3 mg/dL 8.3(L) 7.9(L) 8.1(L)  Total Protein 6.5 - 8.1 g/dL - - 6.4(L)  Total Bilirubin 0.3 - 1.2 mg/dL - - 0.9  Alkaline Phos 38 - 126 U/L - - 485(H)  AST 15 - 41 U/L - - 16  ALT 0 - 44 U/L - - 13    Pertinent Microbiology Results for orders placed or performed during the hospital encounter of 06/04/20  SARS Coronavirus 2 by RT PCR (hospital order, performed in [facility] hospital lab) Nasopharyngeal Nasopharyngeal Swab     Status: Abnormal   Collection Time: 06/04/20 10:58 AM   Specimen: Nasopharyngeal Swab  Result Value Ref Range Status   SARS Coronavirus 2 POSITIVE (A) NEGATIVE Final    Comment: RESULT CALLED TO, READ BACK BY AND VERIFIED WITH: COOPER,J RN '@[id number]'$  ON 06/04/20 JACKSON,K (NOTE) SARS-CoV-2 target nucleic acids are DETECTED  SARS-CoV-2 RNA is generally detectable in upper respiratory specimens  during the acute phase of infection.  Positive results are indicative  of the presence of the identified virus, but do not rule out bacterial infection or co-infection with other pathogens not detected by the test.  Clinical correlation with patient history and  other diagnostic  information is necessary to determine patient infection status.  The expected result is negative.  Fact Sheet for Patients:   StrictlyIdeas.no   Fact Sheet for Healthcare Providers:   BankingDealers.co.za    This test is not yet approved or cleared by the [place] FDA and  has been authorized for detection and/or diagnosis of SARS-CoV-2 by FDA under an Emergency Use Authorization (EUA).  This EUA will remain in effect (meaning thi s test can be used) for the duration of  the COVID-19 declaration under Section 564(b)(1) of the Act, 21 U.S.C. section 360-bbb-3(b)(1), unless the authorization is terminated or revoked sooner.  Performed at [facility], [place] [address] [place], [place] [address]     Pertinent Imaging All pertinent labs/Imagings/notes reviewed. All pertinent plain films and CT images have been personally visualized and interpreted; radiology reports have been reviewed. Decision making incorporated into the Impression / Recommendations.  I have spent 60 minutes for this patient encounter including  review of prior medical records with greater than 50% of time in face to face  counsel of the patient/discussing diagnostics and plan of care.   Electronically signed by:  Rosiland Oz, MD Infectious Disease Physician [facility] [address] [place], [place] [address] Phone: [phone]  Fax: [phone]  '

## 2020-07-28 ENCOUNTER — Other Ambulatory Visit: Payer: Self-pay

## 2020-07-28 ENCOUNTER — Encounter (HOSPITAL_BASED_OUTPATIENT_CLINIC_OR_DEPARTMENT_OTHER): Payer: Medicare Other | Attending: Internal Medicine | Admitting: Internal Medicine

## 2020-07-28 DIAGNOSIS — E11621 Type 2 diabetes mellitus with foot ulcer: Secondary | ICD-10-CM | POA: Insufficient documentation

## 2020-07-28 DIAGNOSIS — N186 End stage renal disease: Secondary | ICD-10-CM | POA: Insufficient documentation

## 2020-07-28 DIAGNOSIS — E1140 Type 2 diabetes mellitus with diabetic neuropathy, unspecified: Secondary | ICD-10-CM | POA: Diagnosis not present

## 2020-07-28 DIAGNOSIS — E1122 Type 2 diabetes mellitus with diabetic chronic kidney disease: Secondary | ICD-10-CM | POA: Insufficient documentation

## 2020-07-28 DIAGNOSIS — S00411A Abrasion of right ear, initial encounter: Secondary | ICD-10-CM | POA: Diagnosis not present

## 2020-07-28 DIAGNOSIS — L89619 Pressure ulcer of right heel, unspecified stage: Secondary | ICD-10-CM | POA: Diagnosis not present

## 2020-07-28 DIAGNOSIS — X58XXXA Exposure to other specified factors, initial encounter: Secondary | ICD-10-CM | POA: Insufficient documentation

## 2020-07-28 DIAGNOSIS — I132 Hypertensive heart and chronic kidney disease with heart failure and with stage 5 chronic kidney disease, or end stage renal disease: Secondary | ICD-10-CM | POA: Diagnosis not present

## 2020-07-28 DIAGNOSIS — L89620 Pressure ulcer of left heel, unstageable: Secondary | ICD-10-CM | POA: Diagnosis not present

## 2020-07-28 NOTE — Progress Notes (Signed)
JATORIAN, KAUK ([id number]) Visit Report for 07/28/2020 Chief Complaint Document Details Patient Name: Date of Service: [name] [name], [name] [place] L. 07/28/2020 10:15 A M Medical Record Number: [id number] Patient Account Number: [id number] Date of Birth/Sex: Treating RN: 1965-04-06 (55 y.o. [name] Primary Care Provider: [name] Other Clinician: Referring Provider: Treating Provider/Extender: [name] in Treatment: 3 Information Obtained from: Patient Chief Complaint Bilateral heel wounds Electronic Signature(s) Signed: 07/28/2020 12:13:27 PM By: Kalman Shan DO Entered By: Kalman Shan on 07/28/2020 12:07:34 -------------------------------------------------------------------------------- Debridement Details Patient Name: Date of Service: [name] [name], [name] L. 07/28/2020 10:15 A M Medical Record Number: [id number] Patient Account Number: [id number] Date of Birth/Sex: Treating RN: 1965/05/08 (55 y.o. Jonette Eva, Briant Cedar Primary Care Provider: [name] Other Clinician: Referring Provider: Treating Provider/Extender: [name] in Treatment: 3 Debridement Performed for Assessment: Wound #2 Right Calcaneus Performed By: Physician Kalman Shan, DO Debridement Type: Debridement Severity of Tissue Pre Debridement: Fat layer exposed Level of Consciousness (Pre-procedure): Awake and Alert Pre-procedure Verification/Time Out Yes - 11:32 Taken: Start Time: 11:32 T Area Debrided (L x W): otal 4 (cm) x 2 (cm) = 8 (cm) Tissue and other material debrided: Non-Viable, Eschar, Slough, Slough Level: Non-Viable Tissue Debridement Description: Selective/Open Wound Instrument: Blade Bleeding: None End Time: 11:33 Procedural Pain: 0 Post Procedural Pain: 0 Response to Treatment: Procedure was tolerated well Level of Consciousness (Post- Awake and Alert procedure): Post Debridement Measurements of Total  Wound Length: (cm) 4 Width: (cm) 1.8 Depth: (cm) 0.2 Volume: (cm) 1.131 Character of Wound/Ulcer Post Debridement: Requires Further Debridement Severity of Tissue Post Debridement: Fat layer exposed Post Procedure Diagnosis Same as Pre-procedure Electronic Signature(s) Signed: 07/28/2020 12:13:27 PM By: Kalman Shan DO Signed: 07/28/2020 5:39:20 PM By: Levan Hurst RN, BSN Entered By: Levan Hurst on 07/28/2020 11:35:34 -------------------------------------------------------------------------------- Debridement Details Patient Name: Date of Service: [name] [name], [name] [name] 07/28/2020 10:15 A M Medical Record Number: [id number] Patient Account Number: [id number] Date of Birth/Sex: Treating RN: 21-Apr-1965 (55 y.o. [name] Primary Care Provider: [name] Other Clinician: Referring Provider: Treating Provider/Extender: [name] in Treatment: 3 Debridement Performed for Assessment: Wound #3 Left Calcaneus Performed By: Physician Kalman Shan, DO Debridement Type: Debridement Severity of Tissue Pre Debridement: Fat layer exposed Level of Consciousness (Pre-procedure): Awake and Alert Pre-procedure Verification/Time Out Yes - 11:32 Taken: Start Time: 11:32 T Area Debrided (L x W): otal 2 (cm) x 2 (cm) = 4 (cm) Tissue and other material debrided: Non-Viable, Eschar, Slough, Slough Level: Non-Viable Tissue Debridement Description: Selective/Open Wound Instrument: Blade Bleeding: None End Time: 11:33 Procedural Pain: 0 Post Procedural Pain: 0 Response to Treatment: Procedure was tolerated well Level of Consciousness (Post- Awake and Alert procedure): Post Debridement Measurements of Total Wound Length: (cm) 5.5 Width: (cm) 6.2 Depth: (cm) 0.7 Volume: (cm) 18.747 Character of Wound/Ulcer Post Debridement: Requires Further Debridement Severity of Tissue Post Debridement: Fat layer exposed Post Procedure  Diagnosis Same as Pre-procedure Electronic Signature(s) Signed: 07/28/2020 12:13:27 PM By: Kalman Shan DO Signed: 07/28/2020 5:39:20 PM By: Levan Hurst RN, BSN Entered By: Levan Hurst on 07/28/2020 11:35:45 -------------------------------------------------------------------------------- HPI Details Patient Name: Date of Service: [name] [name], [name] [name] 07/28/2020 10:15 A M Medical Record Number: [id number] Patient Account Number: [id number] Date of Birth/Sex: Treating RN: December 22, 1965 (55 y.o. [name] Primary Care Provider: [name] Other Clinician: Referring Provider: Treating Provider/Extender: [name] in Treatment: 3 History of Present Illness HPI Description: 4/13 -  [name] [name] is a 55 year old male with past medical history of insulin-dependent type 2 diabetes, end-stage renal disease on hemodialysis, Raynaud's disease with bilateral hand and fingertip ulcers and essential hypertension that presents with bilateral heel ulcers. This has been an ongoing issue for the patient and has been following closely with podiatry. He had debridement and graft application preformed on 06/04/2020 due to worsening of the wounds. Imaging suggested osteo and he was treated with vancomycin and amoxicillin. Today he presents for further evaluation of the wounds. He is currently using Santyl on these areas daily. He uses a cam boot on the left and a surgical shoe on the right to help with offloading. He denies any acute pain, fever/chills, increased warmth or erythema to the feet or purulent drainage. 5/9; unfortunately patient has not followed up to his regular appointments. Its been almost a month since I last saw him. He has been using Santyl daily to the wound beds. He states that he sits a lot in his chair with with his feet on the ground. He reports walking around a lot in the day doing his activities of daily living. He denies acute signs of  infection. Electronic Signature(s) Signed: 07/28/2020 12:13:27 PM By: Kalman Shan DO Entered By: Kalman Shan on 07/28/2020 12:08:38 -------------------------------------------------------------------------------- Physical Exam Details Patient Name: Date of Service: [name] [name], [name] [place] L. 07/28/2020 10:15 A M Medical Record Number: [id number] Patient Account Number: [id number] Date of Birth/Sex: Treating RN: 06/27/1965 (55 y.o. [name] Primary Care Provider: [name] Other Clinician: Referring Provider: Treating Provider/Extender: [name] in Treatment: 3 Constitutional respirations regular, non-labored and within target range for patient.. Cardiovascular 2+ dorsalis pedis/posterior tibialis pulses. Psychiatric pleasant and cooperative. Notes Left calcaneus: There is necrotic tissue throughout with some granulation tissue present no acute signs of infection. Right calcaneus: Eschar present with necrotic tissue surrounding and very little granulation tissue present. No acute signs of infection. Electronic Signature(s) Signed: 07/28/2020 12:13:27 PM By: Kalman Shan DO Entered By: Kalman Shan on 07/28/2020 12:08:52 -------------------------------------------------------------------------------- Physician Orders Details Patient Name: Date of Service: [name] [name], [place] L. 07/28/2020 10:15 A M Medical Record Number: [id number] Patient Account Number: [id number] Date of Birth/Sex: Treating RN: Aug 05, 1965 (55 y.o. [name] Primary Care Provider: [name] Other Clinician: Referring Provider: Treating Provider/Extender: [name] in Treatment: 3 Verbal / Phone Orders: No Diagnosis Coding ICD-10 Coding Code Description L89.619 Pressure ulcer of right heel, unspecified stage L89.620 Pressure ulcer of left heel, unstageable E11.621 Type 2 diabetes mellitus with foot ulcer N18.6  End stage renal disease Follow-up Appointments ppointment in 2 weeks. - with Dr. Heber [place] Return A Bathing/ Shower/ Hygiene May shower and wash wound with soap and water. Edema Control - Lymphedema / SCD / Other Elevate legs to the level of the heart or above for 30 minutes daily and/or when sitting, a frequency of: - throughout the day Avoid standing for long periods of time. Exercise regularly Off-Loading Open toe surgical shoe to: - both feet Home Health No change in wound care orders this week; continue Home Health for wound care. May utilize formulary equivalent dressing for wound treatment orders unless otherwise specified. Other Home Health Orders/Instructions: - [facility] Wound Treatment Wound #2 - Calcaneus Wound Laterality: Right Cleanser: Wound Cleanser (Home Health) 1 x Per Day/7 Days Discharge Instructions: Cleanse the wound with wound cleanser or normal saline prior to applying a clean dressing using gauze sponges, not tissue or cotton balls. Prim Dressing: Santyl  Ointment (Home Health) 1 x Per Day/7 Days ary Discharge Instructions: Apply nickel thick amount to wound bed as instructed Secondary Dressing: Woven Gauze Sponge, Non-Sterile 4x4 in (Home Health) 1 x Per Day/7 Days Discharge Instructions: Apply over primary dressing as directed. Secondary Dressing: ALLEVYN Heel 4 1/2in x 5 1/2in / 10.5cm x 13.5cm (Home Health) 1 x Per Day/7 Days Discharge Instructions: Apply over primary dressing as directed. Secured With: [employer], 4.5x3.1 (in/yd) (Home Health) 1 x Per Day/7 Days Discharge Instructions: Secure with Kerlix as directed. Secured With: Paper Tape, 2x10 (in/yd) (Home Health) 1 x Per Day/7 Days Discharge Instructions: Secure dressing with tape as directed. Wound #3 - Calcaneus Wound Laterality: Left Cleanser: Wound Cleanser (Home Health) 1 x Per Day/7 Days Discharge Instructions: Cleanse the wound with wound cleanser or normal saline prior to applying a  clean dressing using gauze sponges, not tissue or cotton balls. Prim Dressing: Santyl Ointment (Home Health) 1 x Per Day/7 Days ary Discharge Instructions: Apply nickel thick amount to wound bed as instructed Secondary Dressing: Woven Gauze Sponge, Non-Sterile 4x4 in (Home Health) 1 x Per Day/7 Days Discharge Instructions: Apply over primary dressing as directed. Secondary Dressing: ALLEVYN Heel 4 1/2in x 5 1/2in / 10.5cm x 13.5cm (Home Health) 1 x Per Day/7 Days Discharge Instructions: Apply over primary dressing as directed. Secured With: [employer], 4.5x3.1 (in/yd) (Home Health) 1 x Per Day/7 Days Discharge Instructions: Secure with Kerlix as directed. Secured With: Paper Tape, 2x10 (in/yd) (Home Health) 1 x Per Day/7 Days Discharge Instructions: Secure dressing with tape as directed. Patient Medications llergies: No Known Drug Allergies A Notifications Medication Indication Start End 07/28/2020 Santyl DOSE 1 - topical 250 unit/gram ointment - 1 application to the wound daily Electronic Signature(s) Signed: 07/28/2020 1:26:22 PM By: Kalman Shan DO Previous Signature: 07/28/2020 12:13:27 PM Version By: Kalman Shan DO Entered By: Kalman Shan on 07/28/2020 13:26:22 -------------------------------------------------------------------------------- Problem List Details Patient Name: Date of Service: [name] [name], [name] L. 07/28/2020 10:15 A M Medical Record Number: [id number] Patient Account Number: [id number] Date of Birth/Sex: Treating RN: [birth year] (55 y.o. [name] Primary Care Provider: [name] Other Clinician: Referring Provider: Treating Provider/Extender: [name] in Treatment: 3 Active Problems ICD-10 Encounter Code Description Active Date MDM Diagnosis L89.619 Pressure ulcer of right heel, unspecified stage 07/02/2020 No Yes L89.620 Pressure ulcer of left heel, unstageable 07/02/2020 No Yes E11.621 Type  2 diabetes mellitus with foot ulcer 07/02/2020 No Yes N18.6 End stage renal disease 07/02/2020 No Yes Inactive Problems Resolved Problems Electronic Signature(s) Signed: 07/28/2020 12:13:27 PM By: Kalman Shan DO Entered By: Kalman Shan on 07/28/2020 12:07:17 -------------------------------------------------------------------------------- Progress Note Details Patient Name: Date of Service: [name] [name], [place] L. 07/28/2020 10:15 A M Medical Record Number: [id number] Patient Account Number: [id number] Date of Birth/Sex: Treating RN: Nov 26, 1965 (55 y.o. [name] Primary Care Provider: [name] Other Clinician: Referring Provider: Treating Provider/Extender: [name] in Treatment: 3 Subjective Chief Complaint Information obtained from Patient Bilateral heel wounds History of Present Illness (HPI) 4/13 - Amel Debois is a 55 year old male with past medical history of insulin-dependent type 2 diabetes, end-stage renal disease on hemodialysis, Raynaud's disease with bilateral hand and fingertip ulcers and essential hypertension that presents with bilateral heel ulcers. This has been an ongoing issue for the patient and has been following closely with podiatry. He had debridement and graft application preformed on 06/04/2020 due to worsening of the wounds. Imaging suggested osteo  and he was treated with vancomycin and amoxicillin. Today he presents for further evaluation of the wounds. He is currently using Santyl on these areas daily. He uses a cam boot on the left and a surgical shoe on the right to help with offloading. He denies any acute pain, fever/chills, increased warmth or erythema to the feet or purulent drainage. 5/9; unfortunately patient has not followed up to his regular appointments. Its been almost a month since I last saw him. He has been using Santyl daily to the wound beds. He states that he sits a lot in his chair with  with his feet on the ground. He reports walking around a lot in the day doing his activities of daily living. He denies acute signs of infection. Patient History Information obtained from Patient. Family History Unknown History. Social History Never smoker, Marital Status - Single, Alcohol Use - Rarely, Drug Use - No History, Caffeine Use - Rarely. 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 Vitals Time Taken: 10:33 AM, Height: 65 in, Weight: 240 lbs, BMI: 39.9, Temperature: 98.5 F, Pulse: 106 bpm, Respiratory Rate: 18 breaths/min, Blood Pressure: 134/84 mmHg, Capillary Blood Glucose: 119 mg/dl. General Notes: Glucose per patient report Cardiovascular 2+ dorsalis pedis/posterior tibialis pulses. Psychiatric pleasant and cooperative. General Notes: Left calcaneus: There is necrotic tissue throughout with some granulation tissue present no acute signs of infection. Right calcaneus: Eschar present with necrotic tissue surrounding and very little granulation tissue present. No acute signs of infection. Integumentary (Hair, Skin) Wound #2 status is Open. Original cause of wound was Pressure Injury. The date acquired was: 04/22/2020. The wound has been in treatment 3 weeks. The wound is located on the Right Calcaneus. The wound measures 4cm length x 1.8cm width x 0.2cm depth; 5.655cm^2 area and 1.131cm^3 volume. There  is Fat Layer (Subcutaneous Tissue) exposed. There is no tunneling or undermining noted. There is a medium amount of serosanguineous drainage noted. The wound margin is well defined and not attached to the wound base. There is small (1-33%) pink granulation within the wound bed. There is a large (67-100%) amount of necrotic tissue within the wound bed including Eschar and Adherent Slough. Wound #3 status is Open. Original cause of wound was Pressure Injury. The date acquired was: 04/22/2020. The wound has been in treatment 3 weeks. The wound is located on the Left Calcaneus. The wound measures 5.5cm length x 6.2cm width x 0.7cm depth; 26.782cm^2 area and 18.747cm^3 volume. There is Fat Layer (Subcutaneous Tissue) exposed. There is no tunneling or undermining noted. There is a medium amount of serosanguineous drainage noted. The wound margin is well defined and not attached to the wound base. There is small (1-33%) pink granulation within the wound bed. There is a large (67-100%) amount of necrotic tissue within the wound bed including Eschar and Adherent Slough. Assessment Active Problems ICD-10 Pressure ulcer of right heel, unspecified stage Pressure ulcer of left heel, unstageable Type 2 diabetes mellitus with foot ulcer End stage renal disease Patient's wounds show slight improvement since last clinic visit. Unfortunately he has not been able to follow-up with  [place] regularly. We had a long discussion about the importance of offloading these wounds for healing. I recommended he continue Santyl daily. I crosshatched the eschar. I debrided both heels today. No acute signs of infection. He should follow up in 2 weeks. Procedures Wound #2 Pre-procedure diagnosis of Wound #2 is a Diabetic Wound/Ulcer of the Lower Extremity located on the Right Calcaneus .Severity of Tissue Pre Debridement is: Fat layer exposed. There was a Selective/Open Wound Non-Viable Tissue Debridement with a total area of 8 sq cm  performed by Kalman Shan, DO. With the following instrument(s): Blade to remove Non-Viable tissue/material. Material removed includes Eschar and Slough and. No specimens were taken. A time out was conducted at 11:32, prior to the start of the procedure. There was no bleeding. The procedure was tolerated well with a pain level of 0 throughout and a pain level of 0 following the procedure. Post Debridement Measurements: 4cm length x 1.8cm width x 0.2cm depth; 1.131cm^3 volume. Character of Wound/Ulcer Post Debridement requires further debridement. Severity of Tissue Post Debridement is: Fat layer exposed. Post procedure Diagnosis Wound #2: Same as Pre-Procedure Wound #3 Pre-procedure diagnosis of Wound #3 is a Diabetic Wound/Ulcer of the Lower Extremity located on the Left Calcaneus .Severity of Tissue Pre Debridement is: Fat layer exposed. There was a Selective/Open Wound Non-Viable Tissue Debridement with a total area of 4 sq cm performed by Kalman Shan, DO. With the following instrument(s): Blade to remove Non-Viable tissue/material. Material removed includes Eschar and Slough and. No specimens were taken. A time out was conducted at 11:32, prior to the start of the procedure. There was no bleeding. The procedure was tolerated well with a pain level of 0 throughout and a pain level of 0 following the procedure. Post Debridement Measurements: 5.5cm length x 6.2cm width x 0.7cm depth; 18.747cm^3 volume. Character of Wound/Ulcer Post Debridement requires further debridement. Severity of Tissue Post Debridement is: Fat layer exposed. Post procedure Diagnosis Wound #3: Same as Pre-Procedure Plan Follow-up Appointments: Return Appointment in 2 weeks. - with Dr. Heber [place] Bathing/ Shower/ Hygiene: May shower and wash wound with soap and water. Edema Control - Lymphedema / SCD / Other: Elevate legs to the level of the heart or above for 30 minutes daily and/or when sitting, a frequency of: -  throughout the day Avoid standing for long periods of time. Exercise regularly Off-Loading: Open toe surgical shoe to: - both feet Home Health: No change in wound care orders this week; continue Home Health for wound care. May utilize formulary equivalent dressing for wound treatment orders unless otherwise specified. Other Home Health Orders/Instructions: - [facility] WOUND #2: - Calcaneus Wound Laterality: Right Cleanser: Wound Cleanser (Home Health) 1 x Per Day/7 Days Discharge Instructions: Cleanse the wound with wound cleanser or normal saline prior to applying a clean dressing using gauze sponges, not tissue or cotton balls. Prim Dressing: Santyl Ointment (Home Health) 1 x Per Day/7 Days ary Discharge Instructions: Apply nickel thick amount to wound bed as instructed Secondary Dressing: Woven Gauze Sponge, Non-Sterile 4x4 in (Home Health) 1 x Per Day/7 Days Discharge Instructions: Apply over primary dressing as directed. Secondary Dressing: ALLEVYN Heel 4 1/2in x 5 1/2in / 10.5cm x 13.5cm (Home Health) 1 x Per Day/7 Days Discharge Instructions: Apply over primary dressing as directed. Secured With: [employer], 4.5x3.1 (in/yd) (Home Health) 1 x Per Day/7 Days Discharge Instructions: Secure with Kerlix as directed. Secured With: Paper T ape, 2x10 (in/yd) (Home Health) 1 x Per Day/7 Days Discharge  Instructions: Secure dressing with tape as directed. WOUND #3: - Calcaneus Wound Laterality: Left Cleanser: Wound Cleanser (Home Health) 1 x Per Day/7 Days Discharge Instructions: Cleanse the wound with wound cleanser or normal saline prior to applying a clean dressing using gauze sponges, not tissue or cotton balls. Prim Dressing: Santyl Ointment (Home Health) 1 x Per Day/7 Days ary Discharge Instructions: Apply nickel thick amount to wound bed as instructed Secondary Dressing: Woven Gauze Sponge, Non-Sterile 4x4 in (Home Health) 1 x Per Day/7 Days Discharge Instructions: Apply over  primary dressing as directed. Secondary Dressing: ALLEVYN Heel 4 1/2in x 5 1/2in / 10.5cm x 13.5cm (Home Health) 1 x Per Day/7 Days Discharge Instructions: Apply over primary dressing as directed. Secured With: [employer], 4.5x3.1 (in/yd) (Home Health) 1 x Per Day/7 Days Discharge Instructions: Secure with Kerlix as directed. Secured With: Paper T ape, 2x10 (in/yd) (Home Health) 1 x Per Day/7 Days Discharge Instructions: Secure dressing with tape as directed. 1. In office sharp debridement 2. Santyl daily 3. Aggressive offloading 4. Follow-up in 2 weeks Electronic Signature(s) Signed: 07/28/2020 12:13:27 PM By: Kalman Shan DO Entered By: Kalman Shan on 07/28/2020 12:12:06 -------------------------------------------------------------------------------- HxROS Details Patient Name: Date of Service: [name] [name], [place] L. 07/28/2020 10:15 A M Medical Record Number: [id number] Patient Account Number: [id number] Date of Birth/Sex: Treating RN: 15-Jan-1966 (55 y.o. [name] Primary Care Provider: [name] Other Clinician: Referring Provider: Treating Provider/Extender: [name] in Treatment: 3 Information Obtained From Patient Hematologic/Lymphatic Medical History: Positive for: Anemia Cardiovascular Medical History: Positive for: Congestive Heart Failure; Hypertension Past Medical History Notes: Cardiomyopathy, arterial clots Endocrine Medical History: Positive for: Type II Diabetes Treated with: Insulin Blood sugar tested every day: Yes Tested : 2-3x a day Genitourinary Medical History: Positive for: End Stage Renal Disease Past Medical History Notes: Hemodiaylsis Immunological Medical History: Positive for: Raynauds Past Medical History Notes: 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 Neurologic Medical History: Positive for: Neuropathy Immunizations Pneumococcal Vaccine: Received Pneumococcal  Vaccination: Yes Implantable Devices None Family and Social History Unknown History: Yes; Never smoker; Marital Status - Single; Alcohol Use: Rarely; Drug Use: No History; Caffeine Use: Rarely; Financial Concerns: No; Food, Clothing or Shelter Needs: No; Support System Lacking: No; Transportation Concerns: No Electronic Signature(s) Signed: 07/28/2020 12:13:27 PM By: Kalman Shan DO Signed: 07/28/2020 5:39:20 PM By: Levan Hurst RN, BSN Entered By: Kalman Shan on 07/28/2020 12:08:46 -------------------------------------------------------------------------------- SuperBill Details Patient Name: Date of Service: [name] [name], [name] [place] L. 07/28/2020 Medical Record Number: [id number] Patient Account Number: [id number] Date of Birth/Sex: Treating RN: May 19, 1965 (55 y.o. [name] Primary Care Provider: [name] Other Clinician: Referring Provider: Treating Provider/Extender: [name] in Treatment: 3 Diagnosis Coding ICD-10 Codes Code Description [phone] Pressure ulcer of right heel, unspecified stage L89.620 Pressure ulcer of left heel, unstageable E11.621 Type 2 diabetes mellitus with foot ulcer N18.6 End stage renal disease Facility Procedures CPT4 Code: [id number] Description: [phone] - DEBRIDE WOUND 1ST 20 SQ CM OR < ICD-10 Diagnosis Description L89.619 Pressure ulcer of right heel, unspecified stage L89.620 Pressure ulcer of left heel, unstageable Modifier: Quantity: 1 Physician Procedures : CPT4 Code Description Modifier [id number] - WC PHYS DEBR WO ANESTH 20 SQ CM ICD-10 Diagnosis Description L89.619 Pressure ulcer of right heel, unspecified stage L89.620 Pressure ulcer of left heel, unstageable Quantity: 1 Electronic Signature(s) Signed: 07/28/2020 12:13:27 PM By: Kalman Shan DO Entered By: Kalman Shan on 07/28/2020 12:12:18

## 2020-07-28 NOTE — Progress Notes (Addendum)
[name], [name] ([id number]) Visit Report for 07/28/2020 Arrival Information Details Patient Name: Date of Service: [name] [name], [name] L. 07/28/2020 10:15 A M Medical Record Number: [id number] Patient Account Number: [id number] Date of Birth/Sex: Treating RN: 05-31-1965 (55 y.o. [name] [name] Primary Care Georgian Mcclory: [name] [name] Other Clinician: Referring [name] [name]: Treating [name] [name]/Extender: [name] in Treatment: 3 Visit Information History Since Last Visit Added or deleted any medications: No Patient Arrived: Wheel Chair Any new allergies or adverse reactions: No Arrival Time: 10:33 Had a fall or experienced change in No Accompanied By: wife activities of daily living that may affect Transfer Assistance: Manual risk of falls: Patient Identification Verified: Yes Signs or symptoms of abuse/neglect since No Secondary Verification Process Completed: Yes last visito Patient Has Alerts: Yes Hospitalized since last visit: No Patient Alerts: R TBI: 0.56 Implantable device outside of the clinic No L TBI: 0.75 excluding cellular tissue based products placed in the center since last visit: Has Dressing in Place as Prescribed: Yes Has Footwear/Offloading in Place as Yes Prescribed: Left: Removable Cast Walker/Walking Boot Right: Surgical Shoe with Pressure Relief Insole Pain Present Now: Yes Electronic Signature(s) Signed: 07/28/2020 5:46:23 PM By: Lorrin Jackson Entered By: Lorrin Jackson on 07/28/2020 10:33:49 -------------------------------------------------------------------------------- Lower Extremity Assessment Details Patient Name: Date of Service: [name] [name], [name] [place] L. 07/28/2020 10:15 A M Medical Record Number: [id number] Patient Account Number: [id number] Date of Birth/Sex: Treating RN: 1965-10-27 (55 y.o. [name] [name] Primary Care Jessina Marse: [name] [name] Other Clinician: Referring Tredarius Cobern: Treating [name] [name]/Extender:  [name] in Treatment: 3 Edema Assessment Assessed: Shirlyn Goltz: Yes] Patrice Paradise: Yes] Edema: [Left: Yes] [Right: Yes] Calf Left: Right: Point of Measurement: 34 cm From Medial Instep 43 cm 42 cm Ankle Left: Right: Point of Measurement: 12 cm From Medial Instep 25 cm 24 cm Vascular Assessment Pulses: Dorsalis Pedis Palpable: [Left:Yes] [Right:Yes] Notes pedal edema Electronic Signature(s) Signed: 07/28/2020 5:46:23 PM By: Lorrin Jackson Entered By: Lorrin Jackson on 07/28/2020 10:43:29 -------------------------------------------------------------------------------- Multi Wound Chart Details Patient Name: Date of Service: [name] [name], [name] [name] 07/28/2020 10:15 A M Medical Record Number: [id number] Patient Account Number: [id number] Date of Birth/Sex: Treating RN: Jan 06, 1966 (55 y.o. [name] Primary Care Elektra Wartman: [name] [name] Other Clinician: Referring Chenel Wernli: Treating [name] [name]/Extender: [name] in Treatment: 3 Vital Signs Height(in): 65 Capillary Blood Glucose(mg/dl): 119 Weight(lbs): 240 Pulse(bpm): 106 Body Mass Index(BMI): 40 Blood Pressure(mmHg): 134/84 Temperature(F): 98.5 Respiratory Rate(breaths/min): 18 Photos: [2:No Photos Right Calcaneus] [3:No Photos Left Calcaneus] [N/A:N/A N/A] Wound Location: [2:Pressure Injury] [3:Pressure Injury] [N/A:N/A] Wounding Event: [2:Diabetic Wound/Ulcer of the Lower] [3:Diabetic Wound/Ulcer of the Lower] [N/A:N/A] Primary Etiology: [2:Extremity Anemia, Congestive Heart Failure,] [3:Extremity Anemia, Congestive Heart Failure,] [N/A:N/A] Comorbid History: [2:Hypertension, Type II Diabetes, End Hypertension, Type II Diabetes, End Stage Renal Disease, Raynauds, Stage Renal Disease, Raynauds, Neuropathy 04/22/2020] [3:Neuropathy 04/22/2020] [N/A:N/A] Date Acquired: [2:3] [3:3] [N/A:N/A] Weeks of Treatment: [2:Open] [3:Open] [N/A:N/A] Wound Status: [2:4x1.8x0.2]  [3:5.5x6.2x0.7] [N/A:N/A] Measurements L x W x D (cm) [2:5.655] [3:26.782] [N/A:N/A] A (cm) : rea [2:1.131] [3:18.747] [N/A:N/A] Volume (cm) : [2:46.70%] [3:-40.90%] [N/A:N/A] % Reduction in A [2:rea: 46.70%] [3:-9.60%] [N/A:N/A] % Reduction in Volume: [2:Grade 2] [3:Grade 2] [N/A:N/A] Classification: [2:Medium] [3:Medium] [N/A:N/A] Exudate A mount: [2:Serosanguineous] [3:Serosanguineous] [N/A:N/A] Exudate Type: [2:red, brown] [3:red, brown] [N/A:N/A] Exudate Color: [2:Well defined, not attached] [3:Well defined, not attached] [N/A:N/A] Wound Margin: [2:Small (1-33%)] [3:Small (1-33%)] [N/A:N/A] Granulation A mount: [2:Pink] [3:Pink] [N/A:N/A] Granulation Quality: [2:Large (67-100%)] [3:Large (67-100%)] [N/A:N/A] Necrotic A mount: [2:Eschar, Adherent  Slough] [3:Eschar, Adherent Slough] [N/A:N/A] Necrotic Tissue: [2:Fat Layer (Subcutaneous Tissue): Yes Fat Layer (Subcutaneous Tissue): Yes N/A] Exposed Structures: [2:Fascia: No Tendon: No Muscle: No Joint: No Bone: No Small (1-33%)] [3:Fascia: No Tendon: No Muscle: No Joint: No Bone: No None] [N/A:N/A] Epithelialization: [2:Debridement - Selective/Open Wound Debridement - Selective/Open Wound N/A] Debridement: Pre-procedure Verification/Time Out 11:32 [3:11:32] [N/A:N/A] Taken: [2:Necrotic/Eschar, Slough] [3:Necrotic/Eschar, Slough] [N/A:N/A] Tissue Debrided: [2:Non-Viable Tissue] [3:Non-Viable Tissue] [N/A:N/A] Level: [2:8] [3:4] [N/A:N/A] Debridement A (sq cm): [2:rea Blade] [3:Blade] [N/A:N/A] Instrument: [2:None] [3:None] [N/A:N/A] Bleeding: [2:0] [3:0] [N/A:N/A] Procedural Pain: [2:0] [3:0] [N/A:N/A] Post Procedural Pain: [2:Procedure was tolerated well] [3:Procedure was tolerated well] [N/A:N/A] Debridement Treatment Response: [2:4x1.8x0.2] [3:5.5x6.2x0.7] [N/A:N/A] Post Debridement Measurements L x W x D (cm) [2:1.131] [3:18.747] [N/A:N/A] Post Debridement Volume: (cm) [2:Debridement] [3:Debridement] [N/A:N/A] Treatment  Notes Electronic Signature(s) Signed: 07/28/2020 12:13:27 PM By: Kalman Shan DO Signed: 07/28/2020 5:39:20 PM By: Levan Hurst RN, BSN Entered By: Kalman Shan on 07/28/2020 12:07:25 -------------------------------------------------------------------------------- [place] Details Patient Name: Date of Service: [name] [name], [name] [place] L. 07/28/2020 10:15 A M Medical Record Number: [id number] Patient Account Number: [id number] Date of Birth/Sex: Treating RN: 1966-03-02 (55 y.o. [name] Primary Care Rogelio Waynick: [name] [name] Other Clinician: Referring Harish Bram: Treating Azusena Erlandson/Extender: [name] in Treatment: 3 Multidisciplinary Care Plan reviewed with physician Active Inactive Abuse / Safety / Falls / Self Care Management Nursing Diagnoses: Potential for falls Potential for injury related to falls Goals: Patient will not experience any injury related to falls Date Initiated: 07/01/2020 Target Resolution Date: 08/08/2020 Goal Status: Active Patient/caregiver will verbalize/demonstrate measures taken to prevent injury and/or falls Date Initiated: 07/01/2020 Target Resolution Date: 08/08/2020 Goal Status: Active Interventions: Assess Activities of Daily Living upon admission and as needed Assess fall risk on admission and as needed Assess: immobility, friction, shearing, incontinence upon admission and as needed Assess impairment of mobility on admission and as needed per policy Assess personal safety and home safety (as indicated) on admission and as needed Provide education on fall prevention Provide education on personal and home safety Notes: Nutrition Nursing Diagnoses: Impaired glucose control: actual or potential Potential for alteratiion in Nutrition/Potential for imbalanced nutrition Goals: Patient/caregiver agrees to and verbalizes understanding of need to use nutritional supplements and/or vitamins as  prescribed Date Initiated: 07/01/2020 Target Resolution Date: 08/08/2020 Goal Status: Active Patient/caregiver will maintain therapeutic glucose control Date Initiated: 07/01/2020 Target Resolution Date: 08/08/2020 Goal Status: Active Interventions: Assess HgA1c results as ordered upon admission and as needed Assess patient nutrition upon admission and as needed per policy Provide education on elevated blood sugars and impact on wound healing Provide education on nutrition Treatment Activities: Education provided on Nutrition : 07/01/2020 Notes: Wound/Skin Impairment Nursing Diagnoses: Impaired tissue integrity Knowledge deficit related to ulceration/compromised skin integrity Goals: Patient/caregiver will verbalize understanding of skin care regimen Date Initiated: 07/01/2020 Target Resolution Date: 08/08/2020 Goal Status: Active Ulcer/skin breakdown will have a volume reduction of 30% by week 4 Date Initiated: 07/01/2020 Target Resolution Date: 08/08/2020 Goal Status: Active Interventions: Assess patient/caregiver ability to obtain necessary supplies Assess patient/caregiver ability to perform ulcer/skin care regimen upon admission and as needed Assess ulceration(s) every visit Provide education on ulcer and skin care Notes: Electronic Signature(s) Signed: 07/28/2020 5:39:20 PM By: Levan Hurst RN, BSN Entered By: Levan Hurst on 07/28/2020 11:39:40 -------------------------------------------------------------------------------- Pain Assessment Details Patient Name: Date of Service: [name] [name], [place] [name] 07/28/2020 10:15 A M Medical Record Number: [id number] Patient Account Number: [id number] Date of Birth/Sex: Treating RN: January 31, 1966 (  55 y.o. [name] [name] Primary Care Jakelyn Squyres: [name] [name] Other Clinician: Referring Muadh Creasy: Treating Ashanti Ratti/Extender: [name] in Treatment: 3 Active Problems Location of Pain Severity and  Description of Pain Patient Has Paino Yes Site Locations Pain Location: Pain Location: Pain in Ulcers With Dressing Change: Yes Duration of the Pain. Constant / Intermittento Constant Rate the pain. Current Pain Level: 8 Character of Pain Describe the Pain: Sharp, Throbbing Pain Management and Medication Current Pain Management: Medication: Yes Cold Application: No Rest: Yes Massage: No Activity: No T.E.N.S.: No Heat Application: No Leg drop or elevation: No Is the Current Pain Management Adequate: Inadequate How does your wound impact your activities of daily livingo Sleep: Yes Bathing: No Appetite: No Relationship With Others: No Bladder Continence: No Emotions: No Bowel Continence: No Work: No Toileting: No Drive: No Dressing: No Hobbies: No Electronic Signature(s) Signed: 07/28/2020 5:46:23 PM By: Lorrin Jackson Entered By: Lorrin Jackson on 07/28/2020 10:35:43 -------------------------------------------------------------------------------- Patient/Caregiver Education Details Patient Name: Date of Service: [name] Mocha, MA [place] Carlean Jews 5/9/2022andnbsp10:15 [place] Record Number: [id number] Patient Account Number: [id number] Date of Birth/Gender: Treating RN: January 15, 1966 (55 y.o. [name] Primary Care Physician: [name] [name] Other Clinician: Referring Physician: Treating Physician/Extender: [name] in Treatment: 3 Education Assessment Education Provided To: Patient Education Topics Provided Pressure: Methods: Explain/Verbal Responses: State content correctly Wound/Skin Impairment: Methods: Explain/Verbal Responses: State content correctly Electronic Signature(s) Signed: 07/28/2020 5:39:20 PM By: Levan Hurst RN, BSN Entered By: Levan Hurst on 07/28/2020 11:40:57 -------------------------------------------------------------------------------- Wound Assessment Details Patient Name: Date of Service: [name] [name], [name] [place] L. 07/28/2020 10:15 A M Medical Record Number: [id number] Patient Account Number: [id number] Date of Birth/Sex: Treating RN: Aug 01, 1965 (55 y.o. [name] [name] Primary Care Trask Vosler: [name] [name] Other Clinician: Referring Arran Fessel: Treating Jaleigh Mccroskey/Extender: [name] in Treatment: 3 Wound Status Wound Number: 2 Primary Diabetic Wound/Ulcer of the Lower Extremity Etiology: Wound Location: Right Calcaneus Wound Open Wounding Event: Pressure Injury Status: Date Acquired: 04/22/2020 Comorbid Anemia, Congestive Heart Failure, Hypertension, Type II Diabetes, Weeks Of Treatment: 3 History: End Stage Renal Disease, Raynauds, Neuropathy Clustered Wound: No Photos Wound Measurements Length: (cm) 4 Width: (cm) 1.8 Depth: (cm) 0.2 Area: (cm) 5.655 Volume: (cm) 1.131 % Reduction in Area: 46.7% % Reduction in Volume: 46.7% Epithelialization: Small (1-33%) Tunneling: No Undermining: No Wound Description Classification: Grade 2 Wound Margin: Well defined, not attached Exudate Amount: Medium Exudate Type: Serosanguineous Exudate Color: red, brown Foul Odor After Cleansing: No Slough/Fibrino Yes Wound Bed Granulation Amount: Small (1-33%) Exposed Structure Granulation Quality: Pink Fascia Exposed: No Necrotic Amount: Large (67-100%) Fat Layer (Subcutaneous Tissue) Exposed: Yes Necrotic Quality: Eschar, Adherent Slough Tendon Exposed: No Muscle Exposed: No Joint Exposed: No Bone Exposed: No Electronic Signature(s) Signed: 07/29/2020 5:40:35 PM By: Lorrin Jackson Signed: 07/30/2020 9:46:23 AM By: Sandre Kitty Previous Signature: 07/28/2020 5:46:23 PM Version By: Lorrin Jackson Entered By: Sandre Kitty on 07/29/2020 12:29:23 -------------------------------------------------------------------------------- Wound Assessment Details Patient Name: Date of Service: [name] [name], [place] [name] 07/28/2020 10:15 A M Medical Record Number:  [id number] Patient Account Number: [id number] Date of Birth/Sex: Treating RN: 09-10-1965 (55 y.o. [name] [name] Primary Care Zuriyah Shatz: [name] [name] Other Clinician: Referring Loranda Mastel: Treating Almira [place]/Extender: [name] in Treatment: 3 Wound Status Wound Number: 3 Primary Diabetic Wound/Ulcer of the Lower Extremity Etiology: Wound Location: Left Calcaneus Wound Open Wounding Event: Pressure Injury Status: Date Acquired: 04/22/2020 Comorbid Anemia, Congestive Heart Failure, Hypertension, Type II Diabetes, Weeks Of Treatment:  3 History: End Stage Renal Disease, Raynauds, Neuropathy Clustered Wound: No Photos Wound Measurements Length: (cm) 5.5 Width: (cm) 6.2 Depth: (cm) 0.7 Area: (cm) 26.782 Volume: (cm) 18.747 % Reduction in Area: -40.9% % Reduction in Volume: -9.6% Epithelialization: None Tunneling: No Undermining: No Wound Description Classification: Grade 2 Wound Margin: Well defined, not attached Exudate Amount: Medium Exudate Type: Serosanguineous Exudate Color: red, brown Foul Odor After Cleansing: No Slough/Fibrino Yes Wound Bed Granulation Amount: Small (1-33%) Exposed Structure Granulation Quality: Pink Fascia Exposed: No Necrotic Amount: Large (67-100%) Fat Layer (Subcutaneous Tissue) Exposed: Yes Necrotic Quality: Eschar, Adherent Slough Tendon Exposed: No Muscle Exposed: No Joint Exposed: No Bone Exposed: No Electronic Signature(s) Signed: 07/29/2020 5:40:35 PM By: Lorrin Jackson Signed: 07/30/2020 9:46:23 AM By: Sandre Kitty Previous Signature: 07/28/2020 5:46:23 PM Version By: Lorrin Jackson Entered By: Sandre Kitty on 07/29/2020 12:29:45 -------------------------------------------------------------------------------- Vitals Details Patient Name: Date of Service: [name] [name], [place] [name] 07/28/2020 10:15 A M Medical Record Number: [id number] Patient Account Number: [id number] Date of  Birth/Sex: Treating RN: 11/01/1965 (55 y.o. [name] [name] Primary Care Jecenia Leamer: [name] [name] Other Clinician: Referring Kindrick Lankford: Treating Kellie Chisolm/Extender: [name] in Treatment: 3 Vital Signs Time Taken: 10:33 Temperature (F): 98.5 Height (in): 65 Pulse (bpm): 106 Weight (lbs): 240 Respiratory Rate (breaths/min): 18 Body Mass Index (BMI): 39.9 Blood Pressure (mmHg): 134/84 Capillary Blood Glucose (mg/dl): 119 Reference Range: 80 - 120 mg / dl Notes Glucose per patient report Electronic Signature(s) Signed: 07/28/2020 5:46:23 PM By: Lorrin Jackson Entered By: Lorrin Jackson on 07/28/2020 10:36:59

## 2020-07-29 DIAGNOSIS — D631 Anemia in chronic kidney disease: Secondary | ICD-10-CM | POA: Diagnosis not present

## 2020-07-29 DIAGNOSIS — N186 End stage renal disease: Secondary | ICD-10-CM | POA: Diagnosis not present

## 2020-07-29 DIAGNOSIS — N2581 Secondary hyperparathyroidism of renal origin: Secondary | ICD-10-CM | POA: Diagnosis not present

## 2020-07-29 DIAGNOSIS — A419 Sepsis, unspecified organism: Secondary | ICD-10-CM | POA: Diagnosis not present

## 2020-07-29 DIAGNOSIS — D509 Iron deficiency anemia, unspecified: Secondary | ICD-10-CM | POA: Diagnosis not present

## 2020-07-30 DIAGNOSIS — L97429 Non-pressure chronic ulcer of left heel and midfoot with unspecified severity: Secondary | ICD-10-CM | POA: Diagnosis not present

## 2020-07-30 DIAGNOSIS — E1152 Type 2 diabetes mellitus with diabetic peripheral angiopathy with gangrene: Secondary | ICD-10-CM | POA: Diagnosis not present

## 2020-07-30 DIAGNOSIS — L97419 Non-pressure chronic ulcer of right heel and midfoot with unspecified severity: Secondary | ICD-10-CM | POA: Diagnosis not present

## 2020-07-30 DIAGNOSIS — E11621 Type 2 diabetes mellitus with foot ulcer: Secondary | ICD-10-CM | POA: Diagnosis not present

## 2020-07-30 DIAGNOSIS — Z4781 Encounter for orthopedic aftercare following surgical amputation: Secondary | ICD-10-CM | POA: Diagnosis not present

## 2020-07-30 DIAGNOSIS — I7300 Raynaud's syndrome without gangrene: Secondary | ICD-10-CM | POA: Diagnosis not present

## 2020-07-31 DIAGNOSIS — N2581 Secondary hyperparathyroidism of renal origin: Secondary | ICD-10-CM | POA: Diagnosis not present

## 2020-07-31 DIAGNOSIS — D509 Iron deficiency anemia, unspecified: Secondary | ICD-10-CM | POA: Diagnosis not present

## 2020-07-31 DIAGNOSIS — N186 End stage renal disease: Secondary | ICD-10-CM | POA: Diagnosis not present

## 2020-07-31 DIAGNOSIS — A419 Sepsis, unspecified organism: Secondary | ICD-10-CM | POA: Diagnosis not present

## 2020-07-31 DIAGNOSIS — D631 Anemia in chronic kidney disease: Secondary | ICD-10-CM | POA: Diagnosis not present

## 2020-08-01 DIAGNOSIS — I7300 Raynaud's syndrome without gangrene: Secondary | ICD-10-CM | POA: Diagnosis not present

## 2020-08-01 DIAGNOSIS — Z4781 Encounter for orthopedic aftercare following surgical amputation: Secondary | ICD-10-CM | POA: Diagnosis not present

## 2020-08-01 DIAGNOSIS — E1152 Type 2 diabetes mellitus with diabetic peripheral angiopathy with gangrene: Secondary | ICD-10-CM | POA: Diagnosis not present

## 2020-08-01 DIAGNOSIS — L97419 Non-pressure chronic ulcer of right heel and midfoot with unspecified severity: Secondary | ICD-10-CM | POA: Diagnosis not present

## 2020-08-01 DIAGNOSIS — L97429 Non-pressure chronic ulcer of left heel and midfoot with unspecified severity: Secondary | ICD-10-CM | POA: Diagnosis not present

## 2020-08-01 DIAGNOSIS — E11621 Type 2 diabetes mellitus with foot ulcer: Secondary | ICD-10-CM | POA: Diagnosis not present

## 2020-08-02 DIAGNOSIS — D631 Anemia in chronic kidney disease: Secondary | ICD-10-CM | POA: Diagnosis not present

## 2020-08-02 DIAGNOSIS — D509 Iron deficiency anemia, unspecified: Secondary | ICD-10-CM | POA: Diagnosis not present

## 2020-08-02 DIAGNOSIS — A419 Sepsis, unspecified organism: Secondary | ICD-10-CM | POA: Diagnosis not present

## 2020-08-02 DIAGNOSIS — N2581 Secondary hyperparathyroidism of renal origin: Secondary | ICD-10-CM | POA: Diagnosis not present

## 2020-08-02 DIAGNOSIS — N186 End stage renal disease: Secondary | ICD-10-CM | POA: Diagnosis not present

## 2020-08-05 DIAGNOSIS — N186 End stage renal disease: Secondary | ICD-10-CM | POA: Diagnosis not present

## 2020-08-05 DIAGNOSIS — D631 Anemia in chronic kidney disease: Secondary | ICD-10-CM | POA: Diagnosis not present

## 2020-08-05 DIAGNOSIS — D509 Iron deficiency anemia, unspecified: Secondary | ICD-10-CM | POA: Diagnosis not present

## 2020-08-05 DIAGNOSIS — N2581 Secondary hyperparathyroidism of renal origin: Secondary | ICD-10-CM | POA: Diagnosis not present

## 2020-08-05 DIAGNOSIS — A419 Sepsis, unspecified organism: Secondary | ICD-10-CM | POA: Diagnosis not present

## 2020-08-06 DIAGNOSIS — I7300 Raynaud's syndrome without gangrene: Secondary | ICD-10-CM | POA: Diagnosis not present

## 2020-08-06 DIAGNOSIS — L97419 Non-pressure chronic ulcer of right heel and midfoot with unspecified severity: Secondary | ICD-10-CM | POA: Diagnosis not present

## 2020-08-06 DIAGNOSIS — E1152 Type 2 diabetes mellitus with diabetic peripheral angiopathy with gangrene: Secondary | ICD-10-CM | POA: Diagnosis not present

## 2020-08-06 DIAGNOSIS — E11621 Type 2 diabetes mellitus with foot ulcer: Secondary | ICD-10-CM | POA: Diagnosis not present

## 2020-08-06 DIAGNOSIS — L97429 Non-pressure chronic ulcer of left heel and midfoot with unspecified severity: Secondary | ICD-10-CM | POA: Diagnosis not present

## 2020-08-06 DIAGNOSIS — Z4781 Encounter for orthopedic aftercare following surgical amputation: Secondary | ICD-10-CM | POA: Diagnosis not present

## 2020-08-07 DIAGNOSIS — A419 Sepsis, unspecified organism: Secondary | ICD-10-CM | POA: Diagnosis not present

## 2020-08-07 DIAGNOSIS — N186 End stage renal disease: Secondary | ICD-10-CM | POA: Diagnosis not present

## 2020-08-07 DIAGNOSIS — N2581 Secondary hyperparathyroidism of renal origin: Secondary | ICD-10-CM | POA: Diagnosis not present

## 2020-08-07 DIAGNOSIS — D509 Iron deficiency anemia, unspecified: Secondary | ICD-10-CM | POA: Diagnosis not present

## 2020-08-07 DIAGNOSIS — D631 Anemia in chronic kidney disease: Secondary | ICD-10-CM | POA: Diagnosis not present

## 2020-08-08 ENCOUNTER — Other Ambulatory Visit: Payer: Self-pay | Admitting: Surgery

## 2020-08-08 DIAGNOSIS — E1152 Type 2 diabetes mellitus with diabetic peripheral angiopathy with gangrene: Secondary | ICD-10-CM | POA: Diagnosis not present

## 2020-08-08 DIAGNOSIS — L97429 Non-pressure chronic ulcer of left heel and midfoot with unspecified severity: Secondary | ICD-10-CM | POA: Diagnosis not present

## 2020-08-08 DIAGNOSIS — Z4781 Encounter for orthopedic aftercare following surgical amputation: Secondary | ICD-10-CM | POA: Diagnosis not present

## 2020-08-08 DIAGNOSIS — R590 Localized enlarged lymph nodes: Secondary | ICD-10-CM | POA: Diagnosis not present

## 2020-08-08 DIAGNOSIS — E11621 Type 2 diabetes mellitus with foot ulcer: Secondary | ICD-10-CM | POA: Diagnosis not present

## 2020-08-08 DIAGNOSIS — L97419 Non-pressure chronic ulcer of right heel and midfoot with unspecified severity: Secondary | ICD-10-CM | POA: Diagnosis not present

## 2020-08-08 DIAGNOSIS — I7300 Raynaud's syndrome without gangrene: Secondary | ICD-10-CM | POA: Diagnosis not present

## 2020-08-09 DIAGNOSIS — N186 End stage renal disease: Secondary | ICD-10-CM | POA: Diagnosis not present

## 2020-08-09 DIAGNOSIS — D509 Iron deficiency anemia, unspecified: Secondary | ICD-10-CM | POA: Diagnosis not present

## 2020-08-09 DIAGNOSIS — D631 Anemia in chronic kidney disease: Secondary | ICD-10-CM | POA: Diagnosis not present

## 2020-08-09 DIAGNOSIS — A419 Sepsis, unspecified organism: Secondary | ICD-10-CM | POA: Diagnosis not present

## 2020-08-09 DIAGNOSIS — N2581 Secondary hyperparathyroidism of renal origin: Secondary | ICD-10-CM | POA: Diagnosis not present

## 2020-08-11 ENCOUNTER — Other Ambulatory Visit: Payer: Self-pay

## 2020-08-11 ENCOUNTER — Encounter (HOSPITAL_BASED_OUTPATIENT_CLINIC_OR_DEPARTMENT_OTHER): Payer: Medicare Other | Admitting: Internal Medicine

## 2020-08-11 ENCOUNTER — Other Ambulatory Visit (HOSPITAL_COMMUNITY): Payer: Self-pay | Admitting: Internal Medicine

## 2020-08-11 ENCOUNTER — Ambulatory Visit (HOSPITAL_COMMUNITY)
Admission: RE | Admit: 2020-08-11 | Discharge: 2020-08-11 | Disposition: A | Discharge status: Home / Self Care | Payer: Medicare Other | Source: Ambulatory Visit | Attending: Internal Medicine | Admitting: Internal Medicine

## 2020-08-11 DIAGNOSIS — L89619 Pressure ulcer of right heel, unspecified stage: Secondary | ICD-10-CM

## 2020-08-11 DIAGNOSIS — L89629 Pressure ulcer of left heel, unspecified stage: Secondary | ICD-10-CM

## 2020-08-11 DIAGNOSIS — L89899 Pressure ulcer of other site, unspecified stage: Secondary | ICD-10-CM | POA: Diagnosis not present

## 2020-08-11 DIAGNOSIS — M19071 Primary osteoarthritis, right ankle and foot: Secondary | ICD-10-CM | POA: Diagnosis not present

## 2020-08-11 DIAGNOSIS — S00411A Abrasion of right ear, initial encounter: Secondary | ICD-10-CM | POA: Diagnosis not present

## 2020-08-11 DIAGNOSIS — L89620 Pressure ulcer of left heel, unstageable: Secondary | ICD-10-CM | POA: Diagnosis not present

## 2020-08-11 DIAGNOSIS — E1122 Type 2 diabetes mellitus with diabetic chronic kidney disease: Secondary | ICD-10-CM | POA: Diagnosis not present

## 2020-08-11 DIAGNOSIS — E1140 Type 2 diabetes mellitus with diabetic neuropathy, unspecified: Secondary | ICD-10-CM | POA: Diagnosis not present

## 2020-08-11 DIAGNOSIS — I739 Peripheral vascular disease, unspecified: Secondary | ICD-10-CM | POA: Diagnosis not present

## 2020-08-11 DIAGNOSIS — M7989 Other specified soft tissue disorders: Secondary | ICD-10-CM | POA: Diagnosis not present

## 2020-08-11 DIAGNOSIS — E11621 Type 2 diabetes mellitus with foot ulcer: Secondary | ICD-10-CM | POA: Diagnosis not present

## 2020-08-11 NOTE — Progress Notes (Signed)
SELMER, [name] ([id number]) Visit Report for 08/11/2020 Chief Complaint Document Details Patient Name: Date of Service: [name] [name], [name] [name] [name] 08/11/2020 10:15 A M Medical Record Number: [id number] Patient Account Number: [id number] Date of Birth/Sex: Treating RN: [birth year] (55 y.o. [name] [name] Primary Care Provider: Vincente [name] Other Clinician: Referring Provider: Treating Provider/Extender: [name] in Treatment: 5 Information Obtained from: Patient Chief Complaint Bilateral heel wounds Electronic Signature(s) Signed: 08/11/2020 11:51:14 AM By: [name] Shan DO Entered By: [name] [name] on 08/11/2020 11:38:21 -------------------------------------------------------------------------------- Debridement Details Patient Name: Date of Service: [name] Mocha, MA [name] 08/11/2020 10:15 A M Medical Record Number: [id number] Patient Account Number: [id number] Date of Birth/Sex: Treating RN: Oct 03, 1965 (55 y.o. [name] [name] Primary Care Provider: Vincente [name] Other Clinician: Referring Provider: Treating Provider/Extender: [name] in Treatment: 5 Debridement Performed for Assessment: Wound #2 Right Calcaneus Performed By: Physician [name] Shan, DO Debridement Type: Debridement Severity of Tissue Pre Debridement: Fat layer exposed Level of Consciousness (Pre-procedure): Awake and Alert Pre-procedure Verification/Time Out Yes - 11:21 Taken: Start Time: 11:21 Pain Control: Other : Benzocaine 20% T Area Debrided (L x W): otal 3 (cm) x 3 (cm) = 9 (cm) Tissue and other material debrided: Non-Viable, Eschar, Slough, Slough Level: Non-Viable Tissue Debridement Description: Selective/Open Wound Instrument: Blade, Forceps Bleeding: Minimum Hemostasis Achieved: Pressure End Time: 11:22 Procedural Pain: 0 Post Procedural Pain: 0 Response to Treatment: Procedure was tolerated well Level of  Consciousness (Post- Awake and Alert procedure): Post Debridement Measurements of Total Wound Length: (cm) 6 Width: (cm) 5.1 Depth: (cm) 1.7 Volume: (cm) 40.856 Character of Wound/Ulcer Post Debridement: Requires Further Debridement Severity of Tissue Post Debridement: Fat layer exposed Post Procedure Diagnosis Same as Pre-procedure Electronic Signature(s) Signed: 08/11/2020 11:51:14 AM By: [name] Shan DO Signed: 08/11/2020 5:27:53 PM By: [name] Hurst RN, BSN Entered By: [name] [name] on 08/11/2020 11:26:17 -------------------------------------------------------------------------------- Debridement Details Patient Name: Date of Service: [name] Mocha, MA [name] 08/11/2020 10:15 A M Medical Record Number: [id number] Patient Account Number: [id number] Date of Birth/Sex: Treating RN: 30-Mar-1965 (55 y.o. [name] [name] Primary Care Provider: Vincente [name] Other Clinician: Referring Provider: Treating Provider/Extender: [name] in Treatment: 5 Debridement Performed for Assessment: Wound #3 Left Calcaneus Performed By: Physician [name] Shan, DO Debridement Type: Debridement Severity of Tissue Pre Debridement: Fat layer exposed Level of Consciousness (Pre-procedure): Awake and Alert Pre-procedure Verification/Time Out Yes - 11:21 Taken: Start Time: 11:21 Pain Control: Other : Benzocaine 20% T Area Debrided (L x W): otal 2 (cm) x 2 (cm) = 4 (cm) Tissue and other material debrided: Non-Viable, Eschar, Slough, Slough Level: Non-Viable Tissue Debridement Description: Selective/Open Wound Instrument: Blade, Forceps Bleeding: Minimum Hemostasis Achieved: Pressure End Time: 11:22 Procedural Pain: 0 Post Procedural Pain: 0 Response to Treatment: Procedure was tolerated well Level of Consciousness (Post- Awake and Alert procedure): Post Debridement Measurements of Total Wound Length: (cm) 6 Width: (cm) 6 Depth: (cm) 1.2 Volume:  (cm) 33.929 Character of Wound/Ulcer Post Debridement: Requires Further Debridement Severity of Tissue Post Debridement: Fat layer exposed Post Procedure Diagnosis Same as Pre-procedure Electronic Signature(s) Signed: 08/11/2020 11:51:14 AM By: [name] Shan DO Signed: 08/11/2020 5:27:53 PM By: [name] Hurst RN, BSN Entered By: [name] [name] on 08/11/2020 11:26:34 -------------------------------------------------------------------------------- HPI Details Patient Name: Date of Service: [name] Mocha, MA [place] L. 08/11/2020 10:15 A M Medical Record Number: [id number] Patient Account Number: [id number] Date of Birth/Sex: Treating RN: 10-25-1965 (55 y.o. [name] [name] Primary Care Provider: Vincente [name] Other  Clinician: Referring Provider: Treating Provider/Extender: [name] in Treatment: 5 History of Present Illness HPI Description: 4/13 - [name] [name] is a 55 year old male with past medical history of insulin-dependent type 2 diabetes, end-stage renal disease on hemodialysis, Raynaud's disease with bilateral hand and fingertip ulcers and essential hypertension that presents with bilateral heel ulcers. This has been an ongoing issue for the patient and has been following closely with podiatry. He had debridement and graft application preformed on 06/04/2020 due to worsening of the wounds. Imaging suggested osteo and he was treated with vancomycin and amoxicillin. Today he presents for further evaluation of the wounds. He is currently using Santyl on these areas daily. He uses a cam boot on the left and a surgical shoe on the right to help with offloading. He denies any acute pain, fever/chills, increased warmth or erythema to the feet or purulent drainage. 5/9; unfortunately patient has not followed up to his regular appointments. Its been almost a month since I last saw him. He has been using Santyl daily to the wound beds. He states that he sits a lot  in his chair with with his feet on the ground. He reports walking around a lot in the day doing his activities of daily living. He denies acute signs of infection. 5/23; patient presents for his 2-week follow-up. He has been using Santyl daily to the wounds and has been trying to offload his heels throughout the day. He states he has to sleep in a wheelchair and occasionally is able to elevate his legs. He denies acute signs of infection. Electronic Signature(s) Signed: 08/11/2020 11:51:14 AM By: [name] Shan DO Entered By: [name] [name] on 08/11/2020 11:39:35 -------------------------------------------------------------------------------- Physical Exam Details Patient Name: Date of Service: [name] Mocha, MA [name] 08/11/2020 10:15 A M Medical Record Number: [id number] Patient Account Number: [id number] Date of Birth/Sex: Treating RN: Oct 07, 1965 (55 y.o. [name] [name] Primary Care Provider: Vincente [name] Other Clinician: Referring Provider: Treating Provider/Extender: [name] in Treatment: 5 Constitutional respirations regular, non-labored and within target range for patient.. Cardiovascular 2+ dorsalis pedis/posterior tibialis pulses. Psychiatric pleasant and cooperative. Notes Left calcaneus: There is necrotic tissue throughout with some granulation tissue present. Eschar at the distal ened. no acute signs of infection, although improved from last clinic visit. Right calcaneus: Eschar present with necrotic tissue surrounding and very little granulation tissue present. No acute signs of infection. Can probe to surface right above bone. Right ear there is skin breakdown where his mask string overlays. No acute signs of infection. Electronic Signature(s) Signed: 08/11/2020 11:51:14 AM By: [name] Shan DO Entered By: [name] [name] on 08/11/2020  11:48:17 -------------------------------------------------------------------------------- Physician Orders Details Patient Name: Date of Service: [name] Mocha, MA [name] 08/11/2020 10:15 A M Medical Record Number: [id number] Patient Account Number: [id number] Date of Birth/Sex: Treating RN: [birth year] (55 y.o. [name] [name] Primary Care Provider: Vincente [name] Other Clinician: Referring Provider: Treating Provider/Extender: [name] in Treatment: 5 Verbal / Phone Orders: No Diagnosis Coding ICD-10 Coding Code Description [id number] Pressure ulcer of right heel, unspecified stage L89.620 Pressure ulcer of left heel, unstageable S00.411A Abrasion of right ear, initial encounter E11.621 Type 2 diabetes mellitus with foot ulcer N18.6 End stage renal disease Follow-up Appointments ppointment in 1 week. - with Dr. Heber Watson Return A Bathing/ Shower/ Hygiene May shower and wash wound with soap and water. Edema Control - Lymphedema / SCD / Other Elevate legs to the level of the heart or above for  30 minutes daily and/or when sitting, a frequency of: - throughout the day Avoid standing for long periods of time. Exercise regularly Off-Loading Open toe surgical shoe to: - both [facility] wound care orders this week; continue Home Health for wound care. May utilize formulary equivalent dressing for wound treatment orders unless otherwise specified. - Apply Hydrofera Blue over Santyl on both heels Other Home Health Orders/Instructions: - [facility] Wound Treatment Wound #2 - Calcaneus Wound Laterality: Right Cleanser: Wound Cleanser (Home Health) 1 x Per Day/7 Days Discharge Instructions: Cleanse the wound with wound cleanser or normal saline prior to applying a clean dressing using gauze sponges, not tissue or cotton balls. Prim Dressing: Hydrofera Blue Ready Foam, 4x5 in (Home Health) 1 x Per Day/7 Days ary Discharge Instructions: Apply  Hydrofera over Santyl Prim Dressing: Santyl Ointment ([place]) 1 x Per Day/7 Days ary Discharge Instructions: Apply nickel thick amount to wound bed as instructed, apply Hydrofera Blue after Santyl applied. Secondary Dressing: Woven Gauze Sponge, Non-Sterile 4x4 in (Home Health) 1 x Per Day/7 Days Discharge Instructions: Apply over primary dressing as directed. Secondary Dressing: ALLEVYN Heel 4 1/2in x 5 1/2in / 10.5cm x 13.5cm (Home Health) 1 x Per Day/7 Days Discharge Instructions: Apply over primary dressing as directed. Secured With: [employer], 4.5x3.1 (in/yd) (Home Health) 1 x Per Day/7 Days Discharge Instructions: Secure with Kerlix as directed. Secured With: Paper Tape, 2x10 (in/yd) (Home Health) 1 x Per Day/7 Days Discharge Instructions: Secure dressing with tape as directed. Wound #3 - Calcaneus Wound Laterality: Left Cleanser: Wound Cleanser (Home Health) 1 x Per Day/7 Days Discharge Instructions: Cleanse the wound with wound cleanser or normal saline prior to applying a clean dressing using gauze sponges, not tissue or cotton balls. Prim Dressing: Hydrofera Blue Ready Foam, 4x5 in (Home Health) 1 x Per Day/7 Days ary Discharge Instructions: Apply Hydrofera over Santyl Prim Dressing: Santyl Ointment ([place]) 1 x Per Day/7 Days ary Discharge Instructions: Apply nickel thick amount to wound bed as instructed, apply Hydrofera Blue after Santyl applied. Secondary Dressing: Woven Gauze Sponge, Non-Sterile 4x4 in (Home Health) 1 x Per Day/7 Days Discharge Instructions: Apply over primary dressing as directed. Secondary Dressing: ALLEVYN Heel 4 1/2in x 5 1/2in / 10.5cm x 13.5cm (Home Health) 1 x Per Day/7 Days Discharge Instructions: Apply over primary dressing as directed. Secured With: [employer], 4.5x3.1 (in/yd) (Home Health) 1 x Per Day/7 Days Discharge Instructions: Secure with Kerlix as directed. Secured With: Paper Tape, 2x10 (in/yd) (Home Health) 1  x Per Day/7 Days Discharge Instructions: Secure dressing with tape as directed. Wound #4 - Ear Wound Laterality: Right Cleanser: Soap and Water 1 x Per Day Discharge Instructions: May shower and wash wound with dial antibacterial soap and water prior to dressing change. Prim Dressing: Triple Antibiotic Ointment, 0.9 (g) packet 1 x Per Day ary Discharge Instructions: cover with bandaid Radiology X-ray, right foot, complete view - Non healing wound on right heel - (ICD10 L89.619 - Pressure ulcer of right heel, unspecified stage) X-ray, left foot, complete view - Non healing wound on left heel - (ICD10 L89.620 - Pressure ulcer of left heel, unstageable) Patient Medications llergies: No Known Drug Allergies A Notifications Medication Indication Start End 08/11/2020 Santyl DOSE 1 - topical 250 unit/gram ointment - 1 application daily Electronic Signature(s) Signed: 08/11/2020 11:50:29 AM By: [name] Shan DO Entered By: [name] [name] on 08/11/2020 11:50:29 Prescription 08/11/2020 -------------------------------------------------------------------------------- Asman, Delson L. [name] Shan DO Patient Name: Provider: 03/29/1965  [id number] Date of Birth: NPI#: Jerilynn Mages [id number] Sex: DEA #: [phone] [id number] Phone #: License #: [place] Patient Address: [address] [place] [address] [place], [place] [address] [phone] Allergies No Known Drug Allergies Provider's Orders X-ray, right foot, complete view - ICD10: L89.619 - Non healing wound on right heel Hand Signature: Date(s): Prescription 08/11/2020 Plunk, Garret L. [name] Shan DO Patient Name: Provider: 05/08/1965 [id number] Date of Birth: NPI#: Jerilynn Mages [id number] Sex: DEA #: [phone] [id number] Phone #: License #: [place] Patient Address: [facility] [address] [place], [place]  [address] [place], [place] [address] [phone] Allergies No Known Drug Allergies Provider's Orders X-ray, left foot, complete view - ICD10: L89.620 - Non healing wound on left heel Hand Signature: Date(s): Electronic Signature(s) Signed: 08/11/2020 11:51:14 AM By: [name] Shan DO Entered By: [name] [name] on 08/11/2020 11:50:29 -------------------------------------------------------------------------------- Problem List Details Patient Name: Date of Service: [name] Mocha, MA [name] 08/11/2020 10:15 A M Medical Record Number: [id number] Patient Account Number: [id number] Date of Birth/Sex: Treating RN: [birth year] (55 y.o. [name] [name] Primary Care Provider: Vincente [name] Other Clinician: Referring Provider: Treating Provider/Extender: [name] in Treatment: 5 Active Problems ICD-10 Encounter Code Description Active Date MDM Diagnosis L89.619 Pressure ulcer of right heel, unspecified stage 07/02/2020 No Yes L89.620 Pressure ulcer of left heel, unstageable 07/02/2020 No Yes S00.411A Abrasion of right ear, initial encounter 08/11/2020 No Yes E11.621 Type 2 diabetes mellitus with foot ulcer 07/02/2020 No Yes N18.6 End stage renal disease 07/02/2020 No Yes Inactive Problems Resolved Problems Electronic Signature(s) Signed: 08/11/2020 11:51:14 AM By: [name] Shan DO Entered By: [name] [name] on 08/11/2020 11:47:15 -------------------------------------------------------------------------------- Progress Note Details Patient Name: Date of Service: [name] Mocha, MA [place] L. 08/11/2020 10:15 A M Medical Record Number: [id number] Patient Account Number: [id number] Date of Birth/Sex: Treating RN: [birth year] (55 y.o. [name] [name] Primary Care Provider: Vincente [name] Other Clinician: Referring Provider: Treating Provider/Extender: [name] in Treatment: 5 Subjective Chief  Complaint Information obtained from Patient Bilateral heel wounds History of Present Illness (HPI) 4/13 - Desmond Mcewan is a 55 year old male with past medical history of insulin-dependent type 2 diabetes, end-stage renal disease on hemodialysis, Raynaud's disease with bilateral hand and fingertip ulcers and essential hypertension that presents with bilateral heel ulcers. This has been an ongoing issue for the patient and has been following closely with podiatry. He had debridement and graft application preformed on 06/04/2020 due to worsening of the wounds. Imaging suggested osteo and he was treated with vancomycin and amoxicillin. Today he presents for further evaluation of the wounds. He is currently using Santyl on these areas daily. He uses a cam boot on the left and a surgical shoe on the right to help with offloading. He denies any acute pain, fever/chills, increased warmth or erythema to the feet or purulent drainage. 5/9; unfortunately patient has not followed up to his regular appointments. Its been almost a month since I last saw him. He has been using Santyl daily to the wound beds. He states that he sits a lot in his chair with with his feet on the ground. He reports walking around a lot in the day doing his activities of daily living. He denies acute signs of infection. 5/23; patient presents for his 2-week follow-up. He has been using Santyl daily to the wounds and has been trying to offload  his heels throughout the day. He states he has to sleep in a wheelchair and occasionally is able to elevate his legs. He denies acute signs of infection. Patient History Information obtained from Patient. Family History Unknown History. Social History Never smoker, Marital Status - Single, Alcohol Use - Rarely, Drug Use - No History, Caffeine Use - Rarely. 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 Vitals Time Taken: 10:36 AM, Height: 65 in, Weight: 240 lbs, BMI: 39.9, Temperature: 97.8 F, Pulse: 107 bpm, Respiratory Rate: 17 breaths/min, Blood Pressure: 107/53 mmHg, Capillary Blood Glucose: 176 mg/dl. Cardiovascular 2+ dorsalis pedis/posterior tibialis pulses. Psychiatric pleasant and cooperative. General Notes: Left calcaneus: There is necrotic tissue throughout with some granulation tissue present. Eschar at the distal ened. no acute signs of infection, although improved from last clinic visit. Right calcaneus: Eschar present with necrotic tissue surrounding and very little granulation tissue present. No acute signs of infection. Can probe to surface right above bone. Right ear there is skin breakdown where his mask string overlays. No acute signs of infection. Integumentary (Hair, Skin) Wound #2 status is Open. Original cause of wound was Pressure Injury. The date acquired was: 04/22/2020. The wound has been in treatment 5 weeks. The wound is located on the Right Calcaneus. The wound measures 6cm length x 5.1cm width x 1.7cm depth; 24.033cm^2 area and 40.856cm^3 volume. There is Fat Layer (Subcutaneous Tissue) exposed. There is no tunneling noted, however, there is undermining starting at 12:00 and ending at 12:00 with a maximum distance of 1cm. There is a large amount of purulent drainage noted. Foul odor after cleansing was noted. The wound margin is well defined and not attached to the wound  base. There is small (1-33%) pink granulation within the wound bed. There is a large (67-100%) amount of necrotic tissue within the wound bed including Eschar and Adherent Slough. Wound #3 status is Open. Original cause of wound was Pressure Injury. The date acquired was: 04/22/2020. The wound has been in treatment 5 weeks. The wound is located on the Left Calcaneus. The wound measures 6cm length x 6cm width x 1.2cm depth; 28.274cm^2 area and 33.929cm^3 volume. There is Fat Layer (Subcutaneous Tissue) exposed. There is no tunneling or undermining noted. There is a large amount of purulent drainage noted. Foul odor after cleansing was noted. The wound margin is well defined and not attached to the wound base. There is small (1-33%) pink granulation within the wound bed. There is a large (67-100%) amount of necrotic tissue within the wound bed including Eschar and Adherent Slough. Wound #4 status is Open. Original cause of wound was Pressure Injury. The date acquired was: 08/07/2020. The wound is located on the Right Ear. The wound measures 0.5cm length x 0.5cm width x 0.2cm depth; 0.196cm^2 area and 0.039cm^3 volume. There is no tunneling or undermining noted. There is a medium amount of serosanguineous drainage noted. The wound margin is distinct with the outline attached to the wound base. There is large (67-100%) red, pink granulation within the wound bed. There is no necrotic tissue within the wound bed. Assessment Active Problems  ICD-10 Pressure ulcer of right heel, unspecified stage Pressure ulcer of left heel, unstageable Abrasion of right ear, initial encounter Type 2 diabetes mellitus with foot ulcer End stage renal disease Patient has been using Santyl daily. The left heel shows improvement and there is scattered granulation tissue throughout with some eschar present and nonviable tissue. This was debrided. I recommended continuing Santyl and will now add Hydrofera Blue since there is some  maceration noted. The right heel has depth to it and there is thin layer of muscle overlying bone. I will get an x-ray of this. There is a lot of necrotic debris and debridement was attempted. We had a long discussion about the importance of aggressive offloading. He unfortunately sleeps in a wheelchair and it is difficult to offload the heels. We discussed how he will likely have a poor outcome and lose his feet if he is not able to offload. Currently there are no acute signs of infection to the soft tissue. We will give him Prevalon boots. We will also continue with Santyl and add Hydrofera Blue to the right heel. I will see him in 1 week. He has skin abrasion to his right ear where his mask string overlays. I recommended using some antibiotic ointment to this area and covering it to keep the string from rubbing. He has dialysis 3 times a week and that is a main reason he has to wear the mask for long periods. Procedures Wound #2 Pre-procedure diagnosis of Wound #2 is a Diabetic Wound/Ulcer of the Lower Extremity located on the Right Calcaneus .Severity of Tissue Pre Debridement is: Fat layer exposed. There was a Selective/Open Wound Non-Viable Tissue Debridement with a total area of 9 sq cm performed by [name] Shan, DO. With the following instrument(s): Blade, and Forceps to remove Non-Viable tissue/material. Material removed includes Eschar and Slough and after achieving pain control using Other (Benzocaine 20%). No specimens were taken. A time out was conducted at 11:21, prior to the start of the procedure. A Minimum amount of bleeding was controlled with Pressure. The procedure was tolerated well with a pain level of 0 throughout and a pain level of 0 following the procedure. Post Debridement Measurements: 6cm length x 5.1cm width x 1.7cm depth; 40.856cm^3 volume. Character of Wound/Ulcer Post Debridement requires further debridement. Severity of Tissue Post Debridement is: Fat layer  exposed. Post procedure Diagnosis Wound #2: Same as Pre-Procedure Wound #3 Pre-procedure diagnosis of Wound #3 is a Diabetic Wound/Ulcer of the Lower Extremity located on the Left Calcaneus .Severity of Tissue Pre Debridement is: Fat layer exposed. There was a Selective/Open Wound Non-Viable Tissue Debridement with a total area of 4 sq cm performed by [name] Shan, DO. With the following instrument(s): Blade, and Forceps to remove Non-Viable tissue/material. Material removed includes Eschar and Slough and after achieving pain control using Other (Benzocaine 20%). No specimens were taken. A time out was conducted at 11:21, prior to the start of the procedure. A Minimum amount of bleeding was controlled with Pressure. The procedure was tolerated well with a pain level of 0 throughout and a pain level of 0 following the procedure. Post Debridement Measurements: 6cm length x 6cm width x 1.2cm depth; 33.929cm^3 volume. Character of Wound/Ulcer Post Debridement requires further debridement. Severity of Tissue Post Debridement is: Fat layer exposed. Post procedure Diagnosis Wound #3: Same as Pre-Procedure Plan Follow-up Appointments: Return Appointment in 1 week. - with Dr. Heber Payne Bathing/ Shower/ Hygiene: May shower and wash wound with soap and water. Edema Control -  Lymphedema / SCD / Other: Elevate legs to the level of the heart or above for 30 minutes daily and/or when sitting, a frequency of: - throughout the day Avoid standing for long periods of time. Exercise regularly Off-Loading: Open toe surgical shoe to: - both feet Home Health: New wound care orders this week; continue Home Health for wound care. May utilize formulary equivalent dressing for wound treatment orders unless otherwise specified. - Apply Hydrofera Blue over Santyl on both heels Other Home Health Orders/Instructions: - Alvis Lemmings Radiology ordered were: X-ray, right foot, complete view - Non healing wound on right heel,  X-ray, left foot, complete view - Non healing wound on left heel The following medication(s) was prescribed: Santyl topical 250 unit/gram ointment 1 1 application daily starting 08/11/2020 WOUND #2: - Calcaneus Wound Laterality: Right Cleanser: Wound Cleanser (Home Health) 1 x Per Day/7 Days Discharge Instructions: Cleanse the wound with wound cleanser or normal saline prior to applying a clean dressing using gauze sponges, not tissue or cotton balls. Prim Dressing: Hydrofera Blue Ready Foam, 4x5 in (Home Health) 1 x Per Day/7 Days ary Discharge Instructions: Apply Hydrofera over Santyl Prim Dressing: Santyl Ointment ([place]) 1 x Per Day/7 Days ary Discharge Instructions: Apply nickel thick amount to wound bed as instructed, apply Hydrofera Blue after Santyl applied. Secondary Dressing: Woven Gauze Sponge, Non-Sterile 4x4 in (Home Health) 1 x Per Day/7 Days Discharge Instructions: Apply over primary dressing as directed. Secondary Dressing: ALLEVYN Heel 4 1/2in x 5 1/2in / 10.5cm x 13.5cm (Home Health) 1 x Per Day/7 Days Discharge Instructions: Apply over primary dressing as directed. Secured With: [employer], 4.5x3.1 (in/yd) (Home Health) 1 x Per Day/7 Days Discharge Instructions: Secure with Kerlix as directed. Secured With: Paper T ape, 2x10 (in/yd) (Home Health) 1 x Per Day/7 Days Discharge Instructions: Secure dressing with tape as directed. WOUND #3: - Calcaneus Wound Laterality: Left Cleanser: Wound Cleanser (Home Health) 1 x Per Day/7 Days Discharge Instructions: Cleanse the wound with wound cleanser or normal saline prior to applying a clean dressing using gauze sponges, not tissue or cotton balls. Prim Dressing: Hydrofera Blue Ready Foam, 4x5 in (Home Health) 1 x Per Day/7 Days ary Discharge Instructions: Apply Hydrofera over Santyl Prim Dressing: Santyl Ointment ([place]) 1 x Per Day/7 Days ary Discharge Instructions: Apply nickel thick amount to wound bed as  instructed, apply Hydrofera Blue after Santyl applied. Secondary Dressing: Woven Gauze Sponge, Non-Sterile 4x4 in (Home Health) 1 x Per Day/7 Days Discharge Instructions: Apply over primary dressing as directed. Secondary Dressing: ALLEVYN Heel 4 1/2in x 5 1/2in / 10.5cm x 13.5cm (Home Health) 1 x Per Day/7 Days Discharge Instructions: Apply over primary dressing as directed. Secured With: [employer], 4.5x3.1 (in/yd) (Home Health) 1 x Per Day/7 Days Discharge Instructions: Secure with Kerlix as directed. Secured With: Paper T ape, 2x10 (in/yd) (Home Health) 1 x Per Day/7 Days Discharge Instructions: Secure dressing with tape as directed. WOUND #4: - Ear Wound Laterality: Right Cleanser: Soap and Water 1 x Per Day/ Discharge Instructions: May shower and wash wound with dial antibacterial soap and water prior to dressing change. Prim Dressing: Triple Antibiotic Ointment, 0.9 (g) packet 1 x Per Day/ ary Discharge Instructions: cover with bandaid 1. Santyl and Hydrofera Blue daily 2. Aggressive offloading 3. Right and left foot x-rays 4. In office sharp debridement 5. Follow-up in 1 week Electronic Signature(s) Signed: 08/11/2020 11:51:14 AM By: [name] Shan DO Entered By: [name] [name] on 08/11/2020 11:50:39 --------------------------------------------------------------------------------  HxROS Details Patient Name: Date of Service: [name] [name], [place] 08/11/2020 10:15 A M Medical Record Number: [id number] Patient Account Number: [id number] Date of Birth/Sex: Treating RN: 1965-12-15 (55 y.o. [name] [name] Primary Care Provider: Other Clinician: Vincente [name] Referring Provider: Treating Provider/Extender: [name] in Treatment: 5 Information Obtained From Patient Hematologic/Lymphatic Medical History: Positive for: Anemia Cardiovascular Medical History: Positive for: Congestive Heart Failure; Hypertension Past Medical  History Notes: Cardiomyopathy, arterial clots Endocrine Medical History: Positive for: Type II Diabetes Treated with: Insulin Blood sugar tested every day: Yes Tested : 2-3x a day Genitourinary Medical History: Positive for: End Stage Renal Disease Past Medical History Notes: Hemodiaylsis Immunological Medical History: Positive for: Raynauds Past Medical History Notes: 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 Neurologic Medical History: Positive for: Neuropathy Immunizations Pneumococcal Vaccine: Received Pneumococcal Vaccination: Yes Implantable Devices None Family and Social History Unknown History: Yes; Never smoker; Marital Status - Single; Alcohol Use: Rarely; Drug Use: No History; Caffeine Use: Rarely; Financial Concerns: No; Food, Clothing or Shelter Needs: No; Support System Lacking: No; Transportation Concerns: No Electronic Signature(s) Signed: 08/11/2020 11:51:14 AM By: [name] Shan DO Signed: 08/11/2020 5:27:53 PM By: [name] Hurst RN, BSN Entered By: [name] [name] on 08/11/2020 11:39:41 -------------------------------------------------------------------------------- [place] Details Patient Name: Date of Service: [name] Mocha, [place] Grant L. 08/11/2020 Medical Record Number: [id number] Patient Account Number: [id number] Date of Birth/Sex: Treating RN: Feb 03, 1966 (55 y.o. [name] [name] Primary Care Provider: Vincente [name] Other Clinician: Referring Provider: Treating Provider/Extender: [name] in Treatment: 5 Diagnosis Coding ICD-10 Codes Code Description [phone] Pressure ulcer of right heel, unspecified stage L89.620 Pressure ulcer of left heel, unstageable S00.411A Abrasion of right ear, initial encounter E11.621 Type 2 diabetes mellitus with foot ulcer N18.6 End stage renal disease Facility Procedures CPT4 Code: [id number] Description: [phone] - [facility] WOUND 1ST 20 SQ CM OR < ICD-10 Diagnosis Description  L89.619 Pressure ulcer of right heel, unspecified stage L89.620 Pressure ulcer of left heel, unstageable Modifier: Quantity: 1 Physician Procedures : CPT4 Code Description Modifier [id number] - WC PHYS LEVEL 3 - EST PT ICD-10 Diagnosis Description L89.619 Pressure ulcer of right heel, unspecified stage L89.620 Pressure ulcer of left heel, unstageable S00.411A Abrasion of right ear, initial  encounter Quantity: 1 : [id number] - WC PHYS DEBR WO ANESTH 20 SQ CM ICD-10 Diagnosis Description L89.619 Pressure ulcer of right heel, unspecified stage L89.620 Pressure ulcer of left heel, unstageable Quantity: 1 Electronic Signature(s) Signed: 08/11/2020 11:51:14 AM By: [name] Shan DO Entered By: [name] [name] on 08/11/2020 11:50:44

## 2020-08-11 NOTE — Progress Notes (Signed)
[name] [name], [name] [name] ([id number]) Visit Report for 08/11/2020 Arrival Information Details Patient Name: Date of Service: [place], [place] [name] L. 08/11/2020 10:15 A M Medical Record Number: [id number] Patient Account Number: [id number] Date of Birth/Sex: Treating RN: 08/30/1965 (55 y.o. [name] [name], [name] [name] Primary Care [name] [name]: [name] [name] Other Clinician: Referring [name] [name]: Treating [name] [name]/Extender: [name] [name] in Treatment: 5 Visit Information History Since Last Visit Added or deleted any medications: No Patient Arrived: Wheel Chair Any new allergies or adverse reactions: No Arrival Time: 10:24 Had a fall or experienced change in No Accompanied By: mom activities of daily living that may affect Transfer Assistance: None risk of falls: Patient Identification Verified: Yes Signs or symptoms of abuse/neglect since last visito No Secondary Verification Process Completed: Yes Hospitalized since last visit: No Patient Has Alerts: Yes Implantable device outside of the clinic excluding No Patient Alerts: R TBI: 0.56 cellular tissue based products placed in the center L TBI: 0.75 since last visit: Has Dressing in Place as Prescribed: Yes Pain Present Now: Yes Electronic Signature(s) Signed: 08/11/2020 5:30:27 PM By: [name] Hammock RN Entered By: [name] [name] [name] 08/11/2020 10:36:36 -------------------------------------------------------------------------------- Encounter Discharge Information Details Patient Name: Date of Service: [name] Mocha, MA [name] L. 08/11/2020 10:15 A M Medical Record Number: [id number] Patient Account Number: [id number] Date of Birth/Sex: Treating RN: 1965/07/16 (55 y.o. [name] [name] Primary Care [name] [name]: [name] [name] Other Clinician: Referring Amoreena Neubert: Treating [name] [name]/Extender: [name] [name] in Treatment: 5 Encounter Discharge Information Items Post Procedure Vitals Discharge  Condition: Stable Temperature (F): 97.8 Ambulatory Status: Wheelchair Pulse (bpm): 107 Discharge Destination: Home Respiratory Rate (breaths/min): 17 Transportation: Private Auto Blood Pressure (mmHg): 107/53 Accompanied By: Mother Schedule Follow-up Appointment: Yes Clinical Summary of Care: Provided [name] 08/11/2020 Form Type Recipient Paper Patient Patient Electronic Signature(s) Signed: 08/11/2020 3:16:52 PM By: [name] [name] Entered By: [name] [name] [name] 08/11/2020 15:16:52 -------------------------------------------------------------------------------- Lower Extremity Assessment Details Patient Name: Date of Service: [name] Mocha, MA [name] L. 08/11/2020 10:15 A M Medical Record Number: [id number] Patient Account Number: [id number] Date of Birth/Sex: Treating RN: June 12, 1965 (55 y.o. [name] [name], [name] [name] Primary Care Khoi Hamberger: [name] [name] Other Clinician: Referring Adem Costlow: Treating [name] [name]/Extender: [name] [name] in Treatment: 5 [name] [name]: No] [name] [name]: No] [name]: [Left: Yes] [Right: Yes] Calf Left: Right: Point of Measurement: 34 cm From Medial Instep 43 cm 42 cm Ankle Left: Right: Point of Measurement: 12 cm From Medial Instep 25 cm 24 cm Vascular Assessment Pulses: Dorsalis Pedis Palpable: [Left:Yes] [Right:Yes] Posterior Tibial Palpable: [Left:Yes] [Right:Yes] Electronic Signature(s) Signed: 08/11/2020 5:30:27 PM By: [name] Hammock RN Entered By: [name] [name] [name] 08/11/2020 10:38:10 -------------------------------------------------------------------------------- Multi Wound Chart Details Patient Name: Date of Service: [name] Mocha, MA [name] L. 08/11/2020 10:15 A M Medical Record Number: [id number] Patient Account Number: [id number] Date of Birth/Sex: Treating RN: 04/14/1965 (55 y.o. [name] [name] Primary Care Lashawn Orrego: [name] [name] Other Clinician: Referring [name] [name]: Treating [name] [name]/Extender:  [name] [name] in Treatment: 5 Vital Signs Height(in): 65 Capillary Blood Glucose(mg/dl): 176 Weight(lbs): 240 Pulse(bpm): 107 Body Mass Index(BMI): 40 Blood Pressure(mmHg): 107/53 Temperature(F): 97.8 Respiratory Rate(breaths/min): 17 Photos: [2:No Photos Right Calcaneus] [3:No Photos Left Calcaneus] [4:No Photos Right Ear] Wound Location: [2:Pressure Injury] [3:Pressure Injury] [4:Pressure Injury] Wounding Event: [2:Diabetic Wound/Ulcer of the Lower] [3:Diabetic Wound/Ulcer of the Lower] [4:Pressure Ulcer] Primary Etiology: [2:Extremity Anemia, Congestive Heart Failure,] [3:Extremity Anemia, Congestive Heart Failure,] [4:Anemia, Congestive Heart Failure,] Comorbid History: [2:Hypertension, Type II Diabetes, End Stage Renal Disease, Raynauds, Neuropathy 04/22/2020] [3:Hypertension, Type II  Diabetes, End Stage Renal Disease, Raynauds, Neuropathy 04/22/2020] [4:Hypertension, Type II Diabetes, End Stage Renal  Disease, Raynauds, Neuropathy 08/07/2020] Date Acquired: [2:5] [3:5] [4:0] Weeks of Treatment: [2:Open] [3:Open] [4:Open] Wound Status: [2:6x5.1x1.7] [3:6x6x1.2] [4:0.5x0.5x0.2] Measurements L x W x D (cm) [2:24.033] [3:28.274] [4:0.196] A (cm) : rea [2:40.856] [3:33.929] [4:0.039] Volume (cm) : [2:-126.70%] [3:-48.80%] [4:N/A] % Reduction in A rea: [2:-1826.30%] [3:-98.30%] [4:N/A] % Reduction in Volume: [2:12] Starting Position 1 (o'clock): [2:12] Ending Position 1 (o'clock): [2:1] Maximum Distance 1 (cm): [2:Yes] [3:No] [4:No] Undermining: [2:Grade 2] [3:Grade 2] [4:Category/Stage II] Classification: [2:Large] [3:Large] [4:Medium] Exudate A mount: [2:Purulent] [3:Purulent] [4:Serosanguineous] Exudate Type: [2:yellow, brown, green] [3:yellow, brown, green] [4:red, brown] Exudate Color: [2:Yes] [3:Yes] [4:No] Foul Odor A Cleansing: [2:fter No] [3:No] [4:N/A] Odor A nticipated Due to Product Use: [2:Well defined, not attached] [3:Well defined, not  attached] [4:Distinct, outline attached] Wound Margin: [2:Small (1-33%)] [3:Small (1-33%)] [4:Large (67-100%)] Granulation A mount: [2:Pink] [3:Pink] [4:Red, Pink] Granulation Quality: [2:Large (67-100%)] [3:Large (67-100%)] [4:None Present (0%)] Necrotic A mount: [2:Eschar, Adherent Slough] [3:Eschar, Adherent Slough] [4:N/A] Necrotic Tissue: [2:Fat Layer (Subcutaneous Tissue): Yes Fat Layer (Subcutaneous Tissue): Yes Fascia: No] Exposed Structures: [2:Fascia: No Tendon: No Muscle: No Joint: No Bone: No Small (1-33%)] [3:Fascia: No Tendon: No Muscle: No Joint: No Bone: No None] [4:Fat Layer (Subcutaneous Tissue): No Tendon: No Muscle: No Joint: No Bone: No None] Epithelialization: [2:Debridement - Selective/Open Wound Debridement - Selective/Open Wound N/A] Debridement: Pre-procedure Verification/Time Out 11:21 [3:11:21] [4:N/A] Taken: [2:Other] [3:Other] [4:N/A] Pain Control: [2:Necrotic/Eschar, Slough] [3:Necrotic/Eschar, Slough] [4:N/A] Tissue Debrided: [2:Non-Viable Tissue] [3:Non-Viable Tissue] [4:N/A] Level: [2:9] [3:4] [4:N/A] Debridement A (sq cm): [2:rea Blade, Forceps] [3:Blade, Forceps] [4:N/A] Instrument: [2:Minimum] [3:Minimum] [4:N/A] Bleeding: [2:Pressure] [3:Pressure] [4:N/A] Hemostasis A chieved: [2:0] [3:0] [4:N/A] Procedural Pain: [2:0] [3:0] [4:N/A] Post Procedural Pain: [2:Procedure was tolerated well] [3:Procedure was tolerated well] [4:N/A] Debridement Treatment Response: [2:6x5.1x1.7] [3:6x6x1.2] [4:N/A] Post Debridement Measurements L x W x D (cm) [2:40.856] [3:33.929] [4:N/A] Post Debridement Volume: (cm) [2:Debridement] [3:Debridement] [4:N/A] Treatment Notes Electronic Signature(s) Signed: 08/11/2020 11:51:14 AM By: Kalman Shan DO Signed: 08/11/2020 5:27:53 PM By: Levan Hurst RN, BSN Entered By: Kalman Shan [name] 08/11/2020 11:38:13 -------------------------------------------------------------------------------- Multi-Disciplinary Care Plan  Details Patient Name: Date of Service: [name] Mocha, MA [name] L. 08/11/2020 10:15 A M Medical Record Number: [id number] Patient Account Number: [id number] Date of Birth/Sex: Treating RN: 1966/01/07 (55 y.o. [name] [name] Primary Care Zion Ta: [name] [name] Other Clinician: Referring Cashlyn Huguley: Treating Juanitta Earnhardt/Extender: [name] [name] in Treatment: 5 Multidisciplinary Care Plan reviewed with physician Active Inactive Abuse / Safety / Falls / Self Care Management Nursing Diagnoses: Potential for falls Potential for injury related to falls Goals: Patient will not experience any injury related to falls Date Initiated: 07/01/2020 Target Resolution Date: 08/29/2020 Goal Status: Active Patient/caregiver will verbalize/demonstrate measures taken to prevent injury and/or falls Date Initiated: 07/01/2020 Date Inactivated: 08/11/2020 Target Resolution Date: 08/08/2020 Goal Status: Met Interventions: Assess Activities of Daily Living upon admission and as needed Assess fall risk [name] admission and as needed Assess: immobility, friction, shearing, incontinence upon admission and as needed Assess impairment of mobility [name] admission and as needed per policy Assess personal safety and home safety (as indicated) [name] admission and as needed Provide education [name] fall prevention Provide education [name] personal and home safety Notes: Nutrition Nursing Diagnoses: Impaired glucose control: actual or potential Potential for alteratiion in Nutrition/Potential for imbalanced nutrition Goals: Patient/caregiver agrees to and verbalizes understanding of need to use nutritional supplements and/or vitamins as prescribed Date Initiated: 07/01/2020  Date Inactivated: 08/11/2020 Target Resolution Date: 08/08/2020 Goal Status: Met Patient/caregiver will maintain therapeutic glucose control Date Initiated: 07/01/2020 Target Resolution Date: 08/29/2020 Goal Status:  Active Interventions: Assess HgA1c results as ordered upon admission and as needed Assess patient nutrition upon admission and as needed per policy Provide education [name] elevated blood sugars and impact [name] wound healing Provide education [name] nutrition Treatment Activities: Education provided [name] Nutrition : 07/01/2020 Notes: Wound/Skin Impairment Nursing Diagnoses: Impaired tissue integrity Knowledge deficit related to ulceration/compromised skin integrity Goals: Patient/caregiver will verbalize understanding of skin care regimen Date Initiated: 07/01/2020 Target Resolution Date: 08/29/2020 Goal Status: Active Ulcer/skin breakdown will have a volume reduction of 30% by week 4 Date Initiated: 07/01/2020 Date Inactivated: 08/11/2020 Target Resolution Date: 08/08/2020 Unmet Reason: eschar requiring Goal Status: Unmet multiple debridements Ulcer/skin breakdown will have a volume reduction of 50% by week 8 Date Initiated: 08/11/2020 Target Resolution Date: 08/29/2020 Goal Status: Active Interventions: Assess patient/caregiver ability to obtain necessary supplies Assess patient/caregiver ability to perform ulcer/skin care regimen upon admission and as needed Assess ulceration(s) every visit Provide education [name] ulcer and skin care Notes: Electronic Signature(s) Signed: 08/11/2020 5:27:53 PM By: Levan Hurst RN, BSN Entered By: Levan Hurst [name] 08/11/2020 11:25:09 -------------------------------------------------------------------------------- Pain Assessment Details Patient Name: Date of Service: [name] Mocha, MA [name] L. 08/11/2020 10:15 A M Medical Record Number: [id number] Patient Account Number: [id number] Date of Birth/Sex: Treating RN: 20-Jun-1965 (56 y.o. [name] [name], [name] [name] Primary Care Britten Seyfried: [name] [name] Other Clinician: Referring Nemesio Castrillon: Treating Mischelle Reeg/Extender: [name] [name] in Treatment: 5 Active Problems Location of Pain Severity  and Description of Pain Patient Has Paino Yes Site Locations Pain Location: Pain in Ulcers With Dressing Change: Yes Duration of the Pain. Constant / Intermittento Constant Rate the pain. Current Pain Level: 9 Worst Pain Level: 10 Least Pain Level: 0 Tolerable Pain Level: 9 Character of Pain Describe the Pain: Aching Pain Management and Medication Current Pain Management: Medication: Yes Cold Application: No Rest: Yes Massage: No Activity: No T.E.N.S.: No Heat Application: No Leg drop or elevation: No Is the Current Pain Management Adequate: Adequate How does your wound impact your activities of daily livingo Sleep: No Bathing: No Appetite: No Relationship With Others: No Bladder Continence: No Emotions: No Bowel Continence: No Work: No Toileting: No Drive: No Dressing: No Hobbies: No Electronic Signature(s) Signed: 08/11/2020 5:30:27 PM By: [name] Hammock RN Entered By: [name] [name] [name] 08/11/2020 10:37:58 -------------------------------------------------------------------------------- Patient/Caregiver Education Details Patient Name: Date of Service: [name] Mocha, MA [name] Carlean Jews 5/23/2022andnbsp10:15 A M Medical Record Number: [id number] Patient Account Number: [id number] Date of Birth/Gender: Treating RN: 1965-09-10 (55 y.o. [name] [name] Primary Care Physician: [name] [name] Other Clinician: Referring Physician: Treating Physician/Extender: [name] [name] in Treatment: 5 Education Assessment Education Provided To: Patient Education Topics Provided Wound/Skin Impairment: Methods: Explain/Verbal Responses: State content correctly Electronic Signature(s) Signed: 08/11/2020 5:27:53 PM By: Levan Hurst RN, BSN Entered By: Levan Hurst [name] 08/11/2020 11:26:01 -------------------------------------------------------------------------------- Wound Assessment Details Patient Name: Date of Service: [name] Mocha, MA [name] L.  08/11/2020 10:15 A M Medical Record Number: [id number] Patient Account Number: [id number] Date of Birth/Sex: Treating RN: 08-08-1965 (55 y.o. [name] [name], [name] [name] Primary Care Tasharra Nodine: [name] [name] Other Clinician: Referring Keali Mccraw: Treating Ravan Schlemmer/Extender: [name] [name] in Treatment: 5 Wound Status Wound Number: 2 Primary Diabetic Wound/Ulcer of the Lower Extremity Etiology: Wound Location: Right Calcaneus Wound Open Wounding Event: Pressure Injury Status: Date Acquired: 04/22/2020 Comorbid Anemia, Congestive Heart Failure, Hypertension, Type II Diabetes, Weeks Of  Treatment: 5 History: End Stage Renal Disease, Raynauds, Neuropathy Clustered Wound: No Photos Wound Measurements Length: (cm) 6 Width: (cm) 5.1 Depth: (cm) 1.7 Area: (cm) 24.033 Volume: (cm) 40.856 % Reduction in Area: -126.7% % Reduction in Volume: -1826.3% Epithelialization: Small (1-33%) Tunneling: No Undermining: Yes Starting Position (o'clock): 12 Ending Position (o'clock): 12 Maximum Distance: (cm) 1 Wound Description Classification: Grade 2 Wound Margin: Well defined, not attached Exudate Amount: Large Exudate Type: Purulent Exudate Color: yellow, brown, green Foul Odor After Cleansing: Yes Due to Product Use: No Slough/Fibrino Yes Wound Bed Granulation Amount: Small (1-33%) Exposed Structure Granulation Quality: Pink Fascia Exposed: No Necrotic Amount: Large (67-100%) Fat Layer (Subcutaneous Tissue) Exposed: Yes Necrotic Quality: Eschar, Adherent Slough Tendon Exposed: No Muscle Exposed: No Joint Exposed: No Bone Exposed: No Treatment Notes Wound #2 (Calcaneus) Wound Laterality: Right Cleanser Wound Cleanser Discharge Instruction: Cleanse the wound with wound cleanser or normal saline prior to applying a clean dressing using gauze sponges, not tissue or cotton balls. Peri-Wound Care Topical Primary Dressing Santyl Ointment Discharge  Instruction: Apply nickel thick amount to wound bed as instructed, apply Hydrofera Blue after Santyl applied. Hydrofera Blue Ready Foam, 4x5 in Discharge Instruction: Apply Hydrofera over Santyl Secondary Dressing Woven Gauze Sponge, Non-Sterile 4x4 in Discharge Instruction: Apply over primary dressing as directed. ALLEVYN Heel 4 1/2in x 5 1/2in / 10.5cm x 13.5cm Discharge Instruction: Apply over primary dressing as directed. Secured With [employer], 4.5x3.1 (in/yd) Discharge Instruction: Secure with Kerlix as directed. Paper Tape, 2x10 (in/yd) Discharge Instruction: Secure dressing with tape as directed. Compression Wrap Compression Stockings Add-Ons Electronic Signature(s) Signed: 08/11/2020 5:06:59 PM By: Sandre Kitty Signed: 08/11/2020 5:30:27 PM By: [name] Hammock RN Entered By: Sandre Kitty [name] 08/11/2020 16:46:29 -------------------------------------------------------------------------------- Wound Assessment Details Patient Name: Date of Service: [name] Mocha, MA [name] L. 08/11/2020 10:15 A M Medical Record Number: [id number] Patient Account Number: [id number] Date of Birth/Sex: Treating RN: [birth year] (55 y.o. [name] [name], [name] [name] Primary Care Maresa Morash: [name] [name] Other Clinician: Referring Toria Monte: Treating Chevelle Coulson/Extender: [name] [name] in Treatment: 5 Wound Status Wound Number: 3 Primary Diabetic Wound/Ulcer of the Lower Extremity Etiology: Wound Location: Left Calcaneus Wound Open Wounding Event: Pressure Injury Status: Date Acquired: 04/22/2020 Comorbid Anemia, Congestive Heart Failure, Hypertension, Type II Diabetes, Weeks Of Treatment: 5 History: End Stage Renal Disease, Raynauds, Neuropathy Clustered Wound: No Photos Wound Measurements Length: (cm) 6 Width: (cm) 6 Depth: (cm) 1.2 Area: (cm) 28.274 Volume: (cm) 33.929 % Reduction in Area: -48.8% % Reduction in Volume: -98.3% Epithelialization:  None Tunneling: No Undermining: No Wound Description Classification: Grade 2 Wound Margin: Well defined, not attached Exudate Amount: Large Exudate Type: Purulent Exudate Color: yellow, brown, green Foul Odor After Cleansing: Yes Due to Product Use: No Slough/Fibrino Yes Wound Bed Granulation Amount: Small (1-33%) Exposed Structure Granulation Quality: Pink Fascia Exposed: No Necrotic Amount: Large (67-100%) Fat Layer (Subcutaneous Tissue) Exposed: Yes Necrotic Quality: Eschar, Adherent Slough Tendon Exposed: No Muscle Exposed: No Joint Exposed: No Bone Exposed: No Treatment Notes Wound #3 (Calcaneus) Wound Laterality: Left Cleanser Wound Cleanser Discharge Instruction: Cleanse the wound with wound cleanser or normal saline prior to applying a clean dressing using gauze sponges, not tissue or cotton balls. Peri-Wound Care Topical Primary Dressing Santyl Ointment Discharge Instruction: Apply nickel thick amount to wound bed as instructed, apply Hydrofera Blue after Santyl applied. Hydrofera Blue Ready Foam, 4x5 in Discharge Instruction: Apply Hydrofera over Santyl Secondary Dressing Woven Gauze Sponge, Non-Sterile 4x4 in Discharge Instruction: Apply over primary dressing as  directed. ALLEVYN Heel 4 1/2in x 5 1/2in / 10.5cm x 13.5cm Discharge Instruction: Apply over primary dressing as directed. Secured With [employer], 4.5x3.1 (in/yd) Discharge Instruction: Secure with Kerlix as directed. Paper Tape, 2x10 (in/yd) Discharge Instruction: Secure dressing with tape as directed. Compression Wrap Compression Stockings Add-Ons Electronic Signature(s) Signed: 08/11/2020 5:06:59 PM By: Sandre Kitty Signed: 08/11/2020 5:30:27 PM By: [name] Hammock RN Entered By: Sandre Kitty [name] 08/11/2020 16:46:07 -------------------------------------------------------------------------------- Wound Assessment Details Patient Name: Date of Service: [name] Mocha, MA [name] L.  08/11/2020 10:15 A M Medical Record Number: [id number] Patient Account Number: [id number] Date of Birth/Sex: Treating RN: 03/11/1966 (55 y.o. [name] [name], [name] [name] Primary Care Jolyssa Oplinger: [name] [name] Other Clinician: Referring Kenden Brandt: Treating Mort Smelser/Extender: [name] [name] in Treatment: 5 Wound Status Wound Number: 4 Primary Pressure Ulcer Etiology: Wound Location: Right Ear Wound Open Wounding Event: Pressure Injury Status: Date Acquired: 08/07/2020 Comorbid Anemia, Congestive Heart Failure, Hypertension, Type II Diabetes, Weeks Of Treatment: 0 History: End Stage Renal Disease, Raynauds, Neuropathy Clustered Wound: No Photos Wound Measurements Length: (cm) 0.5 Width: (cm) 0.5 Depth: (cm) 0.2 Area: (cm) 0.196 Volume: (cm) 0.039 % Reduction in Area: 0% % Reduction in Volume: 0% Epithelialization: None Tunneling: No Undermining: No Wound Description Classification: Category/Stage II Wound Margin: Distinct, outline attached Exudate Amount: Medium Exudate Type: Serosanguineous Exudate Color: red, brown Foul Odor After Cleansing: No Slough/Fibrino No Wound Bed Granulation Amount: Large (67-100%) Exposed Structure Granulation Quality: Red, Pink Fascia Exposed: No Necrotic Amount: None Present (0%) Fat Layer (Subcutaneous Tissue) Exposed: No Tendon Exposed: No Muscle Exposed: No Joint Exposed: No Bone Exposed: No Treatment Notes Wound #4 (Ear) Wound Laterality: Right Cleanser Soap and Water Discharge Instruction: May shower and wash wound with dial antibacterial soap and water prior to dressing change. Peri-Wound Care Topical Primary Dressing Triple Antibiotic Ointment, 0.9 (g) packet Discharge Instruction: cover with bandaid Secondary Dressing Secured With Compression Wrap Compression Stockings Add-Ons Electronic Signature(s) Signed: 08/11/2020 5:06:59 PM By: Sandre Kitty Signed: 08/11/2020 5:30:27 PM By:  [name] Hammock RN Entered By: Sandre Kitty [name] 08/11/2020 16:47:04 -------------------------------------------------------------------------------- [place] Details Patient Name: Date of Service: [name] Mocha, MA [place] L. 08/11/2020 10:15 A M Medical Record Number: [id number] Patient Account Number: [id number] Date of Birth/Sex: Treating RN: [birth year] (55 y.o. [name] [name], [name] [name] Primary Care Halimah Bewick: [name] [name] Other Clinician: Referring Abdon Petrosky: Treating Eulis Salazar/Extender: [name] [name] in Treatment: 5 Vital Signs Time Taken: 10:36 Temperature (F): 97.8 Height (in): 65 Pulse (bpm): 107 Weight (lbs): 240 Respiratory Rate (breaths/min): 17 Body Mass Index (BMI): 39.9 Blood Pressure (mmHg): 107/53 Capillary Blood Glucose (mg/dl): 176 Reference Range: 80 - 120 mg / dl Electronic Signature(s) Signed: 08/11/2020 5:30:27 PM By: [name] Hammock RN Entered By: [name] [name] [name] 08/11/2020 10:37:31

## 2020-08-12 DIAGNOSIS — D631 Anemia in chronic kidney disease: Secondary | ICD-10-CM | POA: Diagnosis not present

## 2020-08-12 DIAGNOSIS — N2581 Secondary hyperparathyroidism of renal origin: Secondary | ICD-10-CM | POA: Diagnosis not present

## 2020-08-12 DIAGNOSIS — D509 Iron deficiency anemia, unspecified: Secondary | ICD-10-CM | POA: Diagnosis not present

## 2020-08-12 DIAGNOSIS — A419 Sepsis, unspecified organism: Secondary | ICD-10-CM | POA: Diagnosis not present

## 2020-08-12 DIAGNOSIS — N186 End stage renal disease: Secondary | ICD-10-CM | POA: Diagnosis not present

## 2020-08-13 DIAGNOSIS — I7301 Raynaud's syndrome with gangrene: Secondary | ICD-10-CM | POA: Diagnosis not present

## 2020-08-13 DIAGNOSIS — M79609 Pain in unspecified limb: Secondary | ICD-10-CM | POA: Diagnosis not present

## 2020-08-13 DIAGNOSIS — G894 Chronic pain syndrome: Secondary | ICD-10-CM | POA: Diagnosis not present

## 2020-08-13 DIAGNOSIS — G629 Polyneuropathy, unspecified: Secondary | ICD-10-CM | POA: Diagnosis not present

## 2020-08-14 DIAGNOSIS — D631 Anemia in chronic kidney disease: Secondary | ICD-10-CM | POA: Diagnosis not present

## 2020-08-14 DIAGNOSIS — N2581 Secondary hyperparathyroidism of renal origin: Secondary | ICD-10-CM | POA: Diagnosis not present

## 2020-08-14 DIAGNOSIS — N186 End stage renal disease: Secondary | ICD-10-CM | POA: Diagnosis not present

## 2020-08-14 DIAGNOSIS — D509 Iron deficiency anemia, unspecified: Secondary | ICD-10-CM | POA: Diagnosis not present

## 2020-08-14 DIAGNOSIS — A419 Sepsis, unspecified organism: Secondary | ICD-10-CM | POA: Diagnosis not present

## 2020-08-16 DIAGNOSIS — D509 Iron deficiency anemia, unspecified: Secondary | ICD-10-CM | POA: Diagnosis not present

## 2020-08-16 DIAGNOSIS — N186 End stage renal disease: Secondary | ICD-10-CM | POA: Diagnosis not present

## 2020-08-16 DIAGNOSIS — A419 Sepsis, unspecified organism: Secondary | ICD-10-CM | POA: Diagnosis not present

## 2020-08-16 DIAGNOSIS — N2581 Secondary hyperparathyroidism of renal origin: Secondary | ICD-10-CM | POA: Diagnosis not present

## 2020-08-16 DIAGNOSIS — D631 Anemia in chronic kidney disease: Secondary | ICD-10-CM | POA: Diagnosis not present

## 2020-08-19 DIAGNOSIS — D631 Anemia in chronic kidney disease: Secondary | ICD-10-CM | POA: Diagnosis not present

## 2020-08-19 DIAGNOSIS — D509 Iron deficiency anemia, unspecified: Secondary | ICD-10-CM | POA: Diagnosis not present

## 2020-08-19 DIAGNOSIS — N186 End stage renal disease: Secondary | ICD-10-CM | POA: Diagnosis not present

## 2020-08-19 DIAGNOSIS — N2581 Secondary hyperparathyroidism of renal origin: Secondary | ICD-10-CM | POA: Diagnosis not present

## 2020-08-19 DIAGNOSIS — Z992 Dependence on renal dialysis: Secondary | ICD-10-CM | POA: Diagnosis not present

## 2020-08-19 DIAGNOSIS — A419 Sepsis, unspecified organism: Secondary | ICD-10-CM | POA: Diagnosis not present

## 2020-08-20 ENCOUNTER — Encounter (HOSPITAL_BASED_OUTPATIENT_CLINIC_OR_DEPARTMENT_OTHER): Payer: Medicare Other | Attending: Internal Medicine | Admitting: Internal Medicine

## 2020-08-20 ENCOUNTER — Other Ambulatory Visit: Payer: Self-pay

## 2020-08-20 DIAGNOSIS — L89620 Pressure ulcer of left heel, unstageable: Secondary | ICD-10-CM | POA: Diagnosis not present

## 2020-08-20 DIAGNOSIS — L89623 Pressure ulcer of left heel, stage 3: Secondary | ICD-10-CM

## 2020-08-20 DIAGNOSIS — I509 Heart failure, unspecified: Secondary | ICD-10-CM | POA: Diagnosis not present

## 2020-08-20 DIAGNOSIS — E1140 Type 2 diabetes mellitus with diabetic neuropathy, unspecified: Secondary | ICD-10-CM | POA: Insufficient documentation

## 2020-08-20 DIAGNOSIS — L89302 Pressure ulcer of unspecified buttock, stage 2: Secondary | ICD-10-CM | POA: Diagnosis not present

## 2020-08-20 DIAGNOSIS — L89619 Pressure ulcer of right heel, unspecified stage: Secondary | ICD-10-CM | POA: Insufficient documentation

## 2020-08-20 DIAGNOSIS — L89152 Pressure ulcer of sacral region, stage 2: Secondary | ICD-10-CM | POA: Insufficient documentation

## 2020-08-20 DIAGNOSIS — I7300 Raynaud's syndrome without gangrene: Secondary | ICD-10-CM | POA: Insufficient documentation

## 2020-08-20 DIAGNOSIS — Z794 Long term (current) use of insulin: Secondary | ICD-10-CM | POA: Insufficient documentation

## 2020-08-20 DIAGNOSIS — Z992 Dependence on renal dialysis: Secondary | ICD-10-CM | POA: Insufficient documentation

## 2020-08-20 DIAGNOSIS — L89613 Pressure ulcer of right heel, stage 3: Secondary | ICD-10-CM | POA: Diagnosis not present

## 2020-08-20 DIAGNOSIS — N186 End stage renal disease: Secondary | ICD-10-CM | POA: Insufficient documentation

## 2020-08-20 DIAGNOSIS — E11621 Type 2 diabetes mellitus with foot ulcer: Secondary | ICD-10-CM | POA: Insufficient documentation

## 2020-08-20 DIAGNOSIS — E1122 Type 2 diabetes mellitus with diabetic chronic kidney disease: Secondary | ICD-10-CM | POA: Diagnosis not present

## 2020-08-20 DIAGNOSIS — I132 Hypertensive heart and chronic kidney disease with heart failure and with stage 5 chronic kidney disease, or end stage renal disease: Secondary | ICD-10-CM | POA: Diagnosis not present

## 2020-08-20 DIAGNOSIS — L97811 Non-pressure chronic ulcer of other part of right lower leg limited to breakdown of skin: Secondary | ICD-10-CM | POA: Diagnosis not present

## 2020-08-20 NOTE — Progress Notes (Signed)
PAO, GHAN ([id number]) Visit Report for 08/20/2020 Chief Complaint Document Details Patient Name: Date of Service: [name] [name], [name] L. 08/20/2020 9:30 A M Medical Record Number: [id number] Patient Account Number: [id number] Date of Birth/Sex: Treating RN: May 28, 1965 (55 y.o. [name] Primary Care Provider: [name] Other Clinician: Referring Provider: Treating Provider/Extender: [name] in Treatment: 7 Information Obtained from: Patient Chief Complaint Bilateral heel wounds Electronic Signature(s) Signed: 08/20/2020 10:49:52 AM By: Kalman Shan DO Entered By: Kalman Shan on 08/20/2020 10:32:01 -------------------------------------------------------------------------------- Debridement Details Patient Name: Date of Service: [name] [name], [name] 08/20/2020 9:30 A M Medical Record Number: [id number] Patient Account Number: [id number] Date of Birth/Sex: Treating RN: 1965-06-29 (55 y.o. [name] Primary Care Provider: [name] Other Clinician: Referring Provider: Treating Provider/Extender: [name] in Treatment: 7 Debridement Performed for Assessment: Wound #2 Right Calcaneus Performed By: Physician Kalman Shan, DO Debridement Type: Debridement Severity of Tissue Pre Debridement: Fat layer exposed Level of Consciousness (Pre-procedure): Awake and Alert Pre-procedure Verification/Time Out Yes - 10:15 Taken: Start Time: 10:16 Pain Control: Other : benzocaine 20% spray T Area Debrided (L x W): otal 2 (cm) x 3 (cm) = 6 (cm) Tissue and other material debrided: Viable, Non-Viable, Slough, Subcutaneous, Slough Level: Skin/Subcutaneous Tissue Debridement Description: Excisional Instrument: Forceps, Scissors Bleeding: Minimum Hemostasis Achieved: Pressure End Time: 10:19 Procedural Pain: 3 Post Procedural Pain: 2 Response to Treatment: Procedure was tolerated  well Level of Consciousness (Post- Awake and Alert procedure): Post Debridement Measurements of Total Wound Length: (cm) 5.8 Width: (cm) 6.8 Depth: (cm) 1 Volume: (cm) 30.976 Character of Wound/Ulcer Post Debridement: Requires Further Debridement Severity of Tissue Post Debridement: Muscle involvement without necrosis Post Procedure Diagnosis Same as Pre-procedure Electronic Signature(s) Signed: 08/20/2020 10:49:52 AM By: Kalman Shan DO Signed: 08/20/2020 6:06:42 PM By: Baruch Gouty RN, BSN Entered By: Baruch Gouty on 08/20/2020 10:19:12 -------------------------------------------------------------------------------- Debridement Details Patient Name: Date of Service: [name] [name], [name] [place] [name] 08/20/2020 9:30 A M Medical Record Number: [id number] Patient Account Number: [id number] Date of Birth/Sex: Treating RN: 07-19-1965 (55 y.o. [name] Primary Care Provider: [name] Other Clinician: Referring Provider: Treating Provider/Extender: [name] in Treatment: 7 Debridement Performed for Assessment: Wound #3 Left Calcaneus Performed By: Physician Kalman Shan, DO Debridement Type: Debridement Severity of Tissue Pre Debridement: Fat layer exposed Level of Consciousness (Pre-procedure): Awake and Alert Pre-procedure Verification/Time Out Yes - 10:15 Taken: Start Time: 10:16 Pain Control: Other : benzocaine 20% spray T Area Debrided (L x W): otal 3 (cm) x 2 (cm) = 6 (cm) Tissue and other material debrided: Viable, Non-Viable, Eschar, Slough, Subcutaneous, Slough Level: Skin/Subcutaneous Tissue Debridement Description: Excisional Instrument: Blade, Forceps Bleeding: Minimum Hemostasis Achieved: Pressure End Time: 10:19 Procedural Pain: 3 Post Procedural Pain: 2 Response to Treatment: Procedure was tolerated well Level of Consciousness (Post- Awake and Alert procedure): Post Debridement Measurements of Total  Wound Length: (cm) 5.2 Width: (cm) 6.5 Depth: (cm) 0.2 Volume: (cm) 5.309 Character of Wound/Ulcer Post Debridement: Requires Further Debridement Severity of Tissue Post Debridement: Fat layer exposed Post Procedure Diagnosis Same as Pre-procedure Electronic Signature(s) Signed: 08/20/2020 10:49:52 AM By: Kalman Shan DO Signed: 08/20/2020 6:06:42 PM By: Baruch Gouty RN, BSN Entered By: Baruch Gouty on 08/20/2020 10:21:19 -------------------------------------------------------------------------------- HPI Details Patient Name: Date of Service: [name] [name], [name] [name] 08/20/2020 9:30 A M Medical Record Number: [id number] Patient Account Number: [id number] Date of Birth/Sex: Treating RN: 07/13/1965 (55 y.o. [name] Primary Care  Provider: [name] Other Clinician: Referring Provider: Treating Provider/Extender: [name] in Treatment: 7 History of Present Illness HPI Description: 4/13 - [name] [name] is a 55 year old male with past medical history of insulin-dependent type 2 diabetes, end-stage renal disease on hemodialysis, Raynaud's disease with bilateral hand and fingertip ulcers and essential hypertension that presents with bilateral heel ulcers. This has been an ongoing issue for the patient and has been following closely with podiatry. He had debridement and graft application preformed on 06/04/2020 due to worsening of the wounds. Imaging suggested osteo and he was treated with vancomycin and amoxicillin. Today he presents for further evaluation of the wounds. He is currently using Santyl on these areas daily. He uses a cam boot on the left and a surgical shoe on the right to help with offloading. He denies any acute pain, fever/chills, increased warmth or erythema to the feet or purulent drainage. 5/9; unfortunately patient has not followed up to his regular appointments. Its been almost a month since I last saw him. He has been  using Santyl daily to the wound beds. He states that he sits a lot in his chair with with his feet on the ground. He reports walking around a lot in the day doing his activities of daily living. He denies acute signs of infection. 5/23; patient presents for his 2-week follow-up. He has been using Santyl daily to the wounds and has been trying to offload his heels throughout the day. He states he has to sleep in a wheelchair and occasionally is able to elevate his legs. He denies acute signs of infection. 6/1; patient presents for 1 week follow-up. He has been using Santyl to the wounds however only has a little left and refills are not until 6/14. He was discharged as well by home health for unknown reasons. He continues to sleep in a wheelchair and cannot offload his heels. He states he is receiving a hospital bed today. He denies acute signs of infection. Electronic Signature(s) Signed: 08/20/2020 10:49:52 AM By: Kalman Shan DO Entered By: Kalman Shan on 08/20/2020 10:41:43 -------------------------------------------------------------------------------- Physical Exam Details Patient Name: Date of Service: [name] [name], [place] [name] 08/20/2020 9:30 A M Medical Record Number: [id number] Patient Account Number: [id number] Date of Birth/Sex: Treating RN: 09-13-1965 (55 y.o. [name] Primary Care Provider: [name] Other Clinician: Referring Provider: Treating Provider/Extender: [name] in Treatment: 7 Constitutional respirations regular, non-labored and within target range for patient.[name] Psychiatric pleasant and cooperative. Notes Left and right calcaneus with eschar and nonviable tissue present. No acute signs of infection. Right calcaneus is worse than left. The right ear has healed. No acute signs of infection. Electronic Signature(s) Signed: 08/20/2020 10:49:52 AM By: Kalman Shan DO Entered By: Kalman Shan on 08/20/2020  10:43:55 -------------------------------------------------------------------------------- Physician Orders Details Patient Name: Date of Service: [name] [name], [place] L. 08/20/2020 9:30 A M Medical Record Number: [id number] Patient Account Number: [id number] Date of Birth/Sex: Treating RN: 08-Nov-1965 (55 y.o. [name] Primary Care Provider: [name] Other Clinician: Referring Provider: Treating Provider/Extender: [name] in Treatment: 7 Verbal / Phone Orders: No Diagnosis Coding ICD-10 Coding Code Description [id number] Pressure ulcer of right heel, unspecified stage L89.620 Pressure ulcer of left heel, unstageable S00.411A Abrasion of right ear, initial encounter E11.621 Type 2 diabetes mellitus with foot ulcer N18.6 End stage renal disease Follow-up Appointments ppointment in 1 week. - with Dr. Heber Bensley - Monday Return A Bathing/ Shower/ Hygiene May shower  and wash wound with soap and water. Edema Control - Lymphedema / SCD / Other Elevate legs to the level of the heart or above for 30 minutes daily and/or when sitting, a frequency of: - throughout the day Avoid standing for long periods of time. Exercise regularly Off-Loading Open toe surgical shoe to: - both [place] to Home Health for wound care. May utilize formulary equivalent dressing for wound treatment orders unless otherwise specified. Dressing changes to be completed by [place] on Monday / Wednesday / Friday except when patient has scheduled visit at [facility]. Wound Treatment Wound #2 - Calcaneus Wound Laterality: Right Cleanser: Normal Saline (DME) (Generic) 1 x Per Day/30 Days Discharge Instructions: Cleanse the wound with Normal Saline prior to applying a clean dressing using gauze sponges, not tissue or cotton balls. Cleanser: Wound Cleanser (DME) (Generic) 1 x Per Day/30 Days Discharge Instructions: Cleanse the wound with wound cleanser  or normal saline prior to applying a clean dressing using gauze sponges, not tissue or cotton balls. Prim Dressing: Hydrofera Blue Ready Foam, 4x5 in (DME) (Generic) 1 x Per Day/30 Days ary Discharge Instructions: Apply Hydrofera over Santyl Prim Dressing: Santyl Ointment 1 x Per Day/30 Days ary Discharge Instructions: Apply nickel thick amount to wound bed as instructed, apply Hydrofera Blue after Santyl applied. Prim Dressing: MediHoney Gel, tube 1.5 (oz) 1 x Per Day/30 Days ary Discharge Instructions: Apply to wound bed as substitute for santyl until santyl available Secondary Dressing: Woven Gauze Sponge, Non-Sterile 4x4 in (DME) (Generic) 1 x Per Day/30 Days Discharge Instructions: Apply over primary dressing as directed. Secondary Dressing: ALLEVYN Heel 4 1/2in x 5 1/2in / 10.5cm x 13.5cm (DME) (Generic) 1 x Per Day/30 Days Discharge Instructions: Apply over primary dressing as directed. Secured With: [employer], 4.5x3.1 (in/yd) (DME) (Generic) 1 x Per Day/30 Days Discharge Instructions: Secure with Kerlix as directed. Secured With: Paper Tape, 2x10 (in/yd) (DME) (Generic) 1 x Per Day/30 Days Discharge Instructions: Secure dressing with tape as directed. Wound #3 - Calcaneus Wound Laterality: Left Cleanser: Normal Saline (DME) (Generic) 1 x Per Day/30 Days Discharge Instructions: Cleanse the wound with Normal Saline prior to applying a clean dressing using gauze sponges, not tissue or cotton balls. Cleanser: Wound Cleanser (DME) (Generic) 1 x Per Day/30 Days Discharge Instructions: Cleanse the wound with wound cleanser or normal saline prior to applying a clean dressing using gauze sponges, not tissue or cotton balls. Prim Dressing: Hydrofera Blue Ready Foam, 4x5 in (DME) (Generic) 1 x Per Day/30 Days ary Discharge Instructions: Apply Hydrofera over Santyl Prim Dressing: Santyl Ointment 1 x Per Day/30 Days ary Discharge Instructions: Apply nickel thick amount to wound bed  as instructed, apply Hydrofera Blue after Santyl applied. Prim Dressing: MediHoney Gel, tube 1.5 (oz) 1 x Per Day/30 Days ary Discharge Instructions: Apply to wound bed as substitute for santyl until santyl available Secondary Dressing: Woven Gauze Sponge, Non-Sterile 4x4 in (DME) (Generic) 1 x Per Day/30 Days Discharge Instructions: Apply over primary dressing as directed. Secondary Dressing: ALLEVYN Heel 4 1/2in x 5 1/2in / 10.5cm x 13.5cm (DME) (Generic) 1 x Per Day/30 Days Discharge Instructions: Apply over primary dressing as directed. Secured With: [employer], 4.5x3.1 (in/yd) (DME) (Generic) 1 x Per Day/30 Days Discharge Instructions: Secure with Kerlix as directed. Secured With: Paper Tape, 2x10 (in/yd) (DME) (Generic) 1 x Per Day/30 Days Discharge Instructions: Secure dressing with tape as directed. Electronic Signature(s) Signed: 08/20/2020 3:01:45 PM By: Kalman Shan DO  Signed: 08/20/2020 6:06:42 PM By: Baruch Gouty RN, BSN Previous Signature: 08/20/2020 10:49:52 AM Version By: Kalman Shan DO Entered By: Baruch Gouty on 08/20/2020 11:53:23 -------------------------------------------------------------------------------- Problem List Details Patient Name: Date of Service: [name] [name], [place] [name] 08/20/2020 9:30 A M Medical Record Number: [id number] Patient Account Number: [id number] Date of Birth/Sex: Treating RN: 1965-04-05 (55 y.o. [name] Primary Care Provider: [name] Other Clinician: Referring Provider: Treating Provider/Extender: [name] in Treatment: 7 Active Problems ICD-10 Encounter Code Description Active Date MDM Diagnosis L89.619 Pressure ulcer of right heel, unspecified stage 07/02/2020 No Yes L89.620 Pressure ulcer of left heel, unstageable 07/02/2020 No Yes S00.411A Abrasion of right ear, initial encounter 08/11/2020 No Yes E11.621 Type 2 diabetes mellitus with foot ulcer 07/02/2020 No  Yes N18.6 End stage renal disease 07/02/2020 No Yes Inactive Problems Resolved Problems Electronic Signature(s) Signed: 08/20/2020 10:49:52 AM By: Kalman Shan DO Entered By: Kalman Shan on 08/20/2020 10:31:45 -------------------------------------------------------------------------------- Progress Note Details Patient Name: Date of Service: [name] [name], [place] L. 08/20/2020 9:30 A M Medical Record Number: [id number] Patient Account Number: [id number] Date of Birth/Sex: Treating RN: [birth year] (55 y.o. [name] Primary Care Provider: [name] Other Clinician: Referring Provider: Treating Provider/Extender: [name] in Treatment: 7 Subjective Chief Complaint Information obtained from Patient Bilateral heel wounds History of Present Illness (HPI) 4/13 - Syion Ouyang is a 55 year old male with past medical history of insulin-dependent type 2 diabetes, end-stage renal disease on hemodialysis, Raynaud's disease with bilateral hand and fingertip ulcers and essential hypertension that presents with bilateral heel ulcers. This has been an ongoing issue for the patient and has been following closely with podiatry. He had debridement and graft application preformed on 06/04/2020 due to worsening of the wounds. Imaging suggested osteo and he was treated with vancomycin and amoxicillin. Today he presents for further evaluation of the wounds. He is currently using Santyl on these areas daily. He uses a cam boot on the left and a surgical shoe on the right to help with offloading. He denies any acute pain, fever/chills, increased warmth or erythema to the feet or purulent drainage. 5/9; unfortunately patient has not followed up to his regular appointments. Its been almost a month since I last saw him. He has been using Santyl daily to the wound beds. He states that he sits a lot in his chair with with his feet on the ground. He reports walking  around a lot in the day doing his activities of daily living. He denies acute signs of infection. 5/23; patient presents for his 2-week follow-up. He has been using Santyl daily to the wounds and has been trying to offload his heels throughout the day. He states he has to sleep in a wheelchair and occasionally is able to elevate his legs. He denies acute signs of infection. 6/1; patient presents for 1 week follow-up. He has been using Santyl to the wounds however only has a little left and refills are not until 6/14. He was discharged as well by home health for unknown reasons. He continues to sleep in a wheelchair and cannot offload his heels. He states he is receiving a hospital bed today. He denies acute signs of infection. Patient History Information obtained from Patient. Family History Unknown History. Social History Never smoker, Marital Status - Single, Alcohol Use - Rarely, Drug Use - No History, Caffeine Use - Rarely. 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 Vitals Time Taken: 9:42 AM, Height: 65 in, Weight: 240 lbs, BMI: 39.9, Temperature: 98.7 F, Pulse: 108 bpm, Respiratory Rate: 18 breaths/min, Blood Pressure: 117/70 mmHg, Capillary Blood Glucose: 138 mg/dl. General Notes: glucose per pt report Psychiatric pleasant and cooperative. General Notes: Left and right calcaneus with eschar and nonviable  tissue present. No acute signs of infection. Right calcaneus is worse than left. The right ear has healed. No acute signs of infection. Integumentary (Hair, Skin) Wound #2 status is Open. Original cause of wound was Pressure Injury. The date acquired was: 04/22/2020. The wound has been in treatment 7 weeks. The wound is located on the Right Calcaneus. The wound measures 5.8cm length x 6.8cm width x 1cm depth; 30.976cm^2 area and 30.976cm^3 volume. There is Fat Layer (Subcutaneous Tissue) exposed. There is no tunneling or undermining noted. There is a large amount of purulent drainage noted. The wound margin is well defined and not attached to the wound base. There is small (1-33%) pink granulation within the wound bed. There is a large (67-100%) amount of necrotic tissue within the wound bed including Eschar and Adherent Slough. Wound #3 status is Open. Original cause of wound was Pressure Injury. The date acquired was: 04/22/2020. The wound has been in treatment 7 weeks. The wound is located on the Left Calcaneus. The wound measures 5.2cm length x 6.5cm width x 0.2cm depth; 26.546cm^2 area and 5.309cm^3 volume. There is Fat Layer (Subcutaneous Tissue) exposed. There is no tunneling or undermining noted. There is a large amount of serosanguineous drainage noted. The wound margin is well defined and not attached to the wound base. There is medium (34-66%) pink granulation within the wound bed. There is a medium (34-66%) amount of necrotic tissue within the wound bed including Eschar and Adherent Slough. Wound #4 status is Healed - Epithelialized. Original cause of wound was Pressure Injury. The date acquired was: 08/07/2020. The wound has been in treatment 1 weeks. The wound is located on the Right Ear. The wound measures 0cm length x 0cm width x 0cm depth; 0cm^2 area and 0cm^3 volume. There is no tunneling or undermining noted. There is a none present amount of drainage noted. The wound margin is distinct  with the outline attached to the wound base. There is no granulation within the wound bed. There is no necrotic tissue within the wound bed. Assessment Active Problems ICD-10 Pressure ulcer of right heel, unspecified stage Pressure ulcer of left heel, unstageable Abrasion of right ear, initial encounter Type 2 diabetes mellitus with foot ulcer End stage renal disease Patient's wounds are overall stable. X-rays ordered at last clinic visit showed no acute osseous abnormality to the heels. I was able to debride some nonviable tissue. No acute signs of infection. He cannot obtain Santyl until 6/14 we will switch this to Medihoney for now. Would also like for him to use Hydrofera Blue. He states that home health has discharged him for unknown reasons. We will try and obtain home health for him as this would be important for his wound care. We will have him follow-up in 1 week Procedures Wound #2 Pre-procedure diagnosis of Wound #2 is a  Diabetic Wound/Ulcer of the Lower Extremity located on the Right Calcaneus .Severity of Tissue Pre Debridement is: Fat layer exposed. There was a Excisional Skin/Subcutaneous Tissue Debridement with a total area of 6 sq cm performed by Kalman Shan, DO. With the following instrument(s): Forceps, and Scissors to remove Viable and Non-Viable tissue/material. Material removed includes Subcutaneous Tissue and Slough and after achieving pain control using Other (benzocaine 20% spray). No specimens were taken. A time out was conducted at 10:15, prior to the start of the procedure. A Minimum amount of bleeding was controlled with Pressure. The procedure was tolerated well with a pain level of 3 throughout and a pain level of 2 following the procedure. Post Debridement Measurements: 5.8cm length x 6.8cm width x 1cm depth; 30.976cm^3 volume. Character of Wound/Ulcer Post Debridement requires further debridement. Severity of Tissue Post Debridement is: Muscle involvement  without necrosis. Post procedure Diagnosis Wound #2: Same as Pre-Procedure Wound #3 Pre-procedure diagnosis of Wound #3 is a Diabetic Wound/Ulcer of the Lower Extremity located on the Left Calcaneus .Severity of Tissue Pre Debridement is: Fat layer exposed. There was a Excisional Skin/Subcutaneous Tissue Debridement with a total area of 6 sq cm performed by Kalman Shan, DO. With the following instrument(s): Blade, and Forceps to remove Viable and Non-Viable tissue/material. Material removed includes Eschar, Subcutaneous Tissue, and Slough after achieving pain control using Other (benzocaine 20% spray). No specimens were taken. A time out was conducted at 10:15, prior to the start of the procedure. A Minimum amount of bleeding was controlled with Pressure. The procedure was tolerated well with a pain level of 3 throughout and a pain level of 2 following the procedure. Post Debridement Measurements: 5.2cm length x 6.5cm width x 0.2cm depth; 5.309cm^3 volume. Character of Wound/Ulcer Post Debridement requires further debridement. Severity of Tissue Post Debridement is: Fat layer exposed. Post procedure Diagnosis Wound #3: Same as Pre-Procedure Plan Follow-up Appointments: Return Appointment in 1 week. - with Dr. Heber [place] - Monday Bathing/ Shower/ Hygiene: May shower and wash wound with soap and water. Edema Control - Lymphedema / SCD / Other: Elevate legs to the level of the heart or above for 30 minutes daily and/or when sitting, a frequency of: - throughout the day Avoid standing for long periods of time. Exercise regularly Off-Loading: Open toe surgical shoe to: - both feet Home Health: Admit to [facility] for wound care. May utilize formulary equivalent dressing for wound treatment orders unless otherwise specified. Dressing changes to be completed by [place] on Monday / Wednesday / Friday except when patient has scheduled visit at [facility]. WOUND #2: - Calcaneus Wound  Laterality: Right Cleanser: Normal Saline (DME) (Generic) 1 x Per Day/30 Days Discharge Instructions: Cleanse the wound with Normal Saline prior to applying a clean dressing using gauze sponges, not tissue or cotton balls. Cleanser: Wound Cleanser (DME) (Generic) 1 x Per Day/30 Days Discharge Instructions: Cleanse the wound with wound cleanser or normal saline prior to applying a clean dressing using gauze sponges, not tissue or cotton balls. Prim Dressing: Hydrofera Blue Ready Foam, 4x5 in (DME) (Generic) 1 x Per Day/30 Days ary Discharge Instructions: Apply Hydrofera over Santyl Prim Dressing: Santyl Ointment 1 x Per Day/30 Days ary Discharge Instructions: Apply nickel thick amount to wound bed as instructed, apply Hydrofera Blue after Santyl applied. Prim Dressing: MediHoney Gel, tube 1.5 (oz) 1 x Per Day/30 Days ary Discharge Instructions: Apply to wound bed as substitute for santyl until santyl available Secondary Dressing: Woven Gauze Sponge, Non-Sterile  4x4 in (DME) (Generic) 1 x Per Day/30 Days Discharge Instructions: Apply over primary dressing as directed. Secondary Dressing: ALLEVYN Heel 4 1/2in x 5 1/2in / 10.5cm x 13.5cm (DME) (Generic) 1 x Per Day/30 Days Discharge Instructions: Apply over primary dressing as directed. Secured With: [employer], 4.5x3.1 (in/yd) (DME) (Generic) 1 x Per Day/30 Days Discharge Instructions: Secure with Kerlix as directed. Secured With: Paper T ape, 2x10 (in/yd) (DME) (Generic) 1 x Per Day/30 Days Discharge Instructions: Secure dressing with tape as directed. WOUND #3: - Calcaneus Wound Laterality: Left Cleanser: Normal Saline (DME) (Generic) 1 x Per Day/30 Days Discharge Instructions: Cleanse the wound with Normal Saline prior to applying a clean dressing using gauze sponges, not tissue or cotton balls. Cleanser: Wound Cleanser (DME) (Generic) 1 x Per Day/30 Days Discharge Instructions: Cleanse the wound with wound cleanser or normal  saline prior to applying a clean dressing using gauze sponges, not tissue or cotton balls. Prim Dressing: Hydrofera Blue Ready Foam, 4x5 in (DME) (Generic) 1 x Per Day/30 Days ary Discharge Instructions: Apply Hydrofera over Santyl Prim Dressing: Santyl Ointment 1 x Per Day/30 Days ary Discharge Instructions: Apply nickel thick amount to wound bed as instructed, apply Hydrofera Blue after Santyl applied. Prim Dressing: MediHoney Gel, tube 1.5 (oz) 1 x Per Day/30 Days ary Discharge Instructions: Apply to wound bed as substitute for santyl until santyl available Secondary Dressing: Woven Gauze Sponge, Non-Sterile 4x4 in (DME) (Generic) 1 x Per Day/30 Days Discharge Instructions: Apply over primary dressing as directed. Secondary Dressing: ALLEVYN Heel 4 1/2in x 5 1/2in / 10.5cm x 13.5cm (DME) (Generic) 1 x Per Day/30 Days Discharge Instructions: Apply over primary dressing as directed. Secured With: [employer], 4.5x3.1 (in/yd) (DME) (Generic) 1 x Per Day/30 Days Discharge Instructions: Secure with Kerlix as directed. Secured With: Paper T ape, 2x10 (in/yd) (DME) (Generic) 1 x Per Day/30 Days Discharge Instructions: Secure dressing with tape as directed. 1. In office sharp debridement 2. Medihoney and Hydrofera Blue 3. Follow-up in 1 week Electronic Signature(s) Signed: 08/20/2020 3:01:45 PM By: Kalman Shan DO Signed: 08/20/2020 6:06:42 PM By: Baruch Gouty RN, BSN Previous Signature: 08/20/2020 11:33:30 AM Version By: Kalman Shan DO Previous Signature: 08/20/2020 10:49:52 AM Version By: Kalman Shan DO Entered By: Baruch Gouty on 08/20/2020 11:53:42 -------------------------------------------------------------------------------- HxROS Details Patient Name: Date of Service: [name] [name], [name] 08/20/2020 9:30 A M Medical Record Number: [id number] Patient Account Number: [id number] Date of Birth/Sex: Treating RN: [birth year] (55 y.o. [name] Primary Care  Provider: [name] Other Clinician: Referring Provider: Treating Provider/Extender: [name] in Treatment: 7 Information Obtained From Patient Hematologic/Lymphatic Medical History: Positive for: Anemia Cardiovascular Medical History: Positive for: Congestive Heart Failure; Hypertension Past Medical History Notes: Cardiomyopathy, arterial clots Endocrine Medical History: Positive for: Type II Diabetes Treated with: Insulin Blood sugar tested every day: Yes Tested : 2-3x a day Genitourinary Medical History: Positive for: End Stage Renal Disease Past Medical History Notes: Hemodiaylsis Immunological Medical History: Positive for: Raynauds Past Medical History Notes: 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 Neurologic Medical History: Positive for: Neuropathy Immunizations Pneumococcal Vaccine: Received Pneumococcal Vaccination: Yes Implantable Devices None Family and Social History Unknown History: Yes; Never smoker; Marital Status - Single; Alcohol Use: Rarely; Drug Use: No History; Caffeine Use: Rarely; Financial Concerns: No; Food, Clothing or Shelter Needs: No; Support System Lacking: No; Transportation Concerns: No Electronic Signature(s) Signed: 08/20/2020 10:49:52 AM By: Kalman Shan DO Signed: 08/20/2020 6:06:42 PM By: Baruch Gouty RN,  BSN Entered By: Kalman Shan on 08/20/2020 10:41:52 -------------------------------------------------------------------------------- SuperBill Details Patient Name: Date of Service: [name] [name], [place] L. 08/20/2020 Medical Record Number: [id number] Patient Account Number: [id number] Date of Birth/Sex: Treating RN: 09/20/1965 (55 y.o. [name] Primary Care Provider: [name] Other Clinician: Referring Provider: Treating Provider/Extender: [name] in Treatment: 7 Diagnosis Coding ICD-10 Codes Code Description [phone]  Pressure ulcer of right heel, unspecified stage L89.620 Pressure ulcer of left heel, unstageable S00.411A Abrasion of right ear, initial encounter E11.621 Type 2 diabetes mellitus with foot ulcer N18.6 End stage renal disease Facility Procedures CPT4 Code: [id number] Description: [id number] - DEB SUBQ TISSUE 20 SQ CM/< ICD-10 Diagnosis Description L89.619 Pressure ulcer of right heel, unspecified stage L89.620 Pressure ulcer of left heel, unstageable Modifier: Quantity: 1 Physician Procedures : CPT4 Code Description Modifier [id number] - WC PHYS SUBQ TISS 20 SQ CM ICD-10 Diagnosis Description L89.619 Pressure ulcer of right heel, unspecified stage L89.620 Pressure ulcer of left heel, unstageable Quantity: 1 Electronic Signature(s) Signed: 08/20/2020 10:49:52 AM By: Kalman Shan DO Entered By: Kalman Shan on 08/20/2020 10:46:38

## 2020-08-21 DIAGNOSIS — N2581 Secondary hyperparathyroidism of renal origin: Secondary | ICD-10-CM | POA: Diagnosis not present

## 2020-08-21 DIAGNOSIS — E119 Type 2 diabetes mellitus without complications: Secondary | ICD-10-CM | POA: Diagnosis not present

## 2020-08-21 DIAGNOSIS — N186 End stage renal disease: Secondary | ICD-10-CM | POA: Diagnosis not present

## 2020-08-21 DIAGNOSIS — D631 Anemia in chronic kidney disease: Secondary | ICD-10-CM | POA: Diagnosis not present

## 2020-08-23 DIAGNOSIS — N2581 Secondary hyperparathyroidism of renal origin: Secondary | ICD-10-CM | POA: Diagnosis not present

## 2020-08-23 DIAGNOSIS — D631 Anemia in chronic kidney disease: Secondary | ICD-10-CM | POA: Diagnosis not present

## 2020-08-23 DIAGNOSIS — N186 End stage renal disease: Secondary | ICD-10-CM | POA: Diagnosis not present

## 2020-08-25 ENCOUNTER — Encounter (HOSPITAL_BASED_OUTPATIENT_CLINIC_OR_DEPARTMENT_OTHER): Payer: Medicare Other | Admitting: Internal Medicine

## 2020-08-25 ENCOUNTER — Other Ambulatory Visit: Payer: Self-pay

## 2020-08-25 ENCOUNTER — Ambulatory Visit (INDEPENDENT_AMBULATORY_CARE_PROVIDER_SITE_OTHER): Payer: Medicare Other | Admitting: Podiatry

## 2020-08-25 ENCOUNTER — Encounter: Payer: Self-pay | Admitting: Podiatry

## 2020-08-25 DIAGNOSIS — L89302 Pressure ulcer of unspecified buttock, stage 2: Secondary | ICD-10-CM | POA: Diagnosis not present

## 2020-08-25 DIAGNOSIS — Z992 Dependence on renal dialysis: Secondary | ICD-10-CM

## 2020-08-25 DIAGNOSIS — M79675 Pain in left toe(s): Secondary | ICD-10-CM

## 2020-08-25 DIAGNOSIS — L97101 Non-pressure chronic ulcer of unspecified thigh limited to breakdown of skin: Secondary | ICD-10-CM

## 2020-08-25 DIAGNOSIS — L89619 Pressure ulcer of right heel, unspecified stage: Secondary | ICD-10-CM | POA: Diagnosis not present

## 2020-08-25 DIAGNOSIS — N186 End stage renal disease: Secondary | ICD-10-CM

## 2020-08-25 DIAGNOSIS — B351 Tinea unguium: Secondary | ICD-10-CM

## 2020-08-25 DIAGNOSIS — L89620 Pressure ulcer of left heel, unstageable: Secondary | ICD-10-CM

## 2020-08-25 DIAGNOSIS — Z794 Long term (current) use of insulin: Secondary | ICD-10-CM | POA: Diagnosis not present

## 2020-08-25 DIAGNOSIS — M79674 Pain in right toe(s): Secondary | ICD-10-CM | POA: Diagnosis not present

## 2020-08-25 DIAGNOSIS — E11621 Type 2 diabetes mellitus with foot ulcer: Secondary | ICD-10-CM | POA: Diagnosis not present

## 2020-08-25 DIAGNOSIS — E1122 Type 2 diabetes mellitus with diabetic chronic kidney disease: Secondary | ICD-10-CM | POA: Diagnosis not present

## 2020-08-25 DIAGNOSIS — L97811 Non-pressure chronic ulcer of other part of right lower leg limited to breakdown of skin: Secondary | ICD-10-CM | POA: Diagnosis not present

## 2020-08-25 DIAGNOSIS — L89152 Pressure ulcer of sacral region, stage 2: Secondary | ICD-10-CM | POA: Diagnosis not present

## 2020-08-26 DIAGNOSIS — D631 Anemia in chronic kidney disease: Secondary | ICD-10-CM | POA: Diagnosis not present

## 2020-08-26 DIAGNOSIS — N2581 Secondary hyperparathyroidism of renal origin: Secondary | ICD-10-CM | POA: Diagnosis not present

## 2020-08-26 DIAGNOSIS — N186 End stage renal disease: Secondary | ICD-10-CM | POA: Diagnosis not present

## 2020-08-27 NOTE — Progress Notes (Signed)
[name] [name], [name] [name] ([id number]) Visit Report for 08/25/2020 Arrival Information Details Patient Name: Date of Service: [name] [name] 08/25/2020 2:45 PM Medical Record Number: [id number] Patient Account Number: [id number] Date of Birth/Sex: Treating RN: 1965-08-13 (55 y.o. [name] [name] Primary Care [place] H[place]: [name] [name] Other Clinician: Referring [name] [name]: Treating [name] [name]/Extender: [name] [name] in Treatment: 7 Visit Information History Since Last Visit Added or deleted any medications: No Patient Arrived: Wheel Chair Any new allergies or adverse reactions: No Arrival Time: 15:20 Had a fall or experienced change in No Accompanied By: wife activities of daily living that may affect Transfer Assistance: Manual risk of falls: Patient Identification Verified: Yes Signs or symptoms of abuse/neglect since last visito No Secondary Verification Process Completed: Yes Hospitalized since last visit: No Patient Has Alerts: Yes Implantable device outside of the clinic excluding No Patient Alerts: R TBI: 0.56 cellular tissue based products placed in the center L TBI: 0.75 since last visit: Has Dressing in Place as Prescribed: Yes Pain Present Now: Yes Electronic Signature(s) Signed: 08/25/2020 5:38:29 PM By: Lorrin Jackson Entered By: Lorrin Jackson on 08/25/2020 15:28:04 -------------------------------------------------------------------------------- Encounter Discharge Information Details Patient Name: Date of Service: [name] [name], [name] Kerr L. 08/25/2020 2:45 PM Medical Record Number: [id number] Patient Account Number: [id number] Date of Birth/Sex: Treating RN: 04-08-1965 (55 y.o. [name] [name] Primary Care [name] [name]: [name] [name] Other Clinician: Referring Azjah Pardo: Treating Theressa Piedra/Extender: [name] [name] in Treatment: 7 Encounter Discharge Information Items Post Procedure Vitals Discharge Condition:  Stable Temperature (F): 98.8 Ambulatory Status: Wheelchair Pulse (bpm): 118 Discharge Destination: Home Respiratory Rate (breaths/min): 20 Transportation: Private Auto Blood Pressure (mmHg): 127/77 Accompanied By: wife Schedule Follow-up Appointment: Yes Clinical Summary of Care: Provided on 08/25/2020 Form Type Recipient Paper Patient Patient Electronic Signature(s) Signed: 08/25/2020 5:29:36 PM By: Lorrin Jackson Entered By: Lorrin Jackson on 08/25/2020 17:29:35 -------------------------------------------------------------------------------- Lower Extremity Assessment Details Patient Name: Date of Service: [name] Mocha, [name] 08/25/2020 2:45 PM Medical Record Number: [id number] Patient Account Number: [id number] Date of Birth/Sex: Treating RN: 07/27/1965 (55 y.o. [name] [name] Primary Care [name] [name]: [name] [name] Other Clinician: Referring [name] [name]: Treating Shahed Yeoman/Extender: [name] [name] in Treatment: 7 Edema Assessment Assessed: Shirlyn Goltz: Yes] [name] [name]: Yes] Edema: [Left: Yes] [Right: Yes] Calf Left: Right: Point of Measurement: 34 cm From Medial Instep 50 cm 49 cm Ankle Left: Right: Point of Measurement: 12 cm From Medial Instep 25 cm 26.5 cm Vascular Assessment Pulses: Dorsalis Pedis Palpable: [Left:Yes] [Right:Yes] Electronic Signature(s) Signed: 08/25/2020 5:38:29 PM By: Lorrin Jackson Entered By: Lorrin Jackson on 08/25/2020 15:40:10 -------------------------------------------------------------------------------- Multi Wound Chart Details Patient Name: Date of Service: [name] Mocha, [name] 08/25/2020 2:45 PM Medical Record Number: [id number] Patient Account Number: [id number] Date of Birth/Sex: Treating RN: July 16, 1965 (55 y.o. [name] [name] Primary Care [place] Mi: [name] [name] Other Clinician: Referring [name] [name]: Treating [name] [name]/Extender: [name] [name] in Treatment: 7 Vital  Signs Height(in): 65 Capillary Blood Glucose(mg/dl): 138 Weight(lbs): 240 Pulse(bpm): 118 Body Mass Index(BMI): 40 Blood Pressure(mmHg): 127/77 Temperature(F): 98.8 Respiratory Rate(breaths/min): 20 Photos: [2:No Photos Right Calcaneus] [3:No Photos Left Calcaneus] [N/A:N/A N/A] Wound Location: [2:Pressure Injury] [3:Pressure Injury] [N/A:N/A] Wounding Event: [2:Diabetic Wound/Ulcer of the Lower] [3:Diabetic Wound/Ulcer of the Lower] [N/A:N/A] Primary Etiology: [2:Extremity Anemia, Congestive Heart Failure,] [3:Extremity Anemia, Congestive Heart Failure,] [N/A:N/A] Comorbid History: [2:Hypertension, Type II Diabetes, End Stage Renal Disease, Raynauds, Neuropathy 04/22/2020] [3:Hypertension, Type II Diabetes, End Stage Renal Disease, Raynauds, Neuropathy 04/22/2020] [N/A:N/A] Date Acquired: [2:7] [3:7] [N/A:N/A] Weeks of Treatment: [2:Open] [  3:Open] [N/A:N/A] Wound Status: [2:5.5x5.7x1] [3:6x6.6x0.3] [N/A:N/A] Measurements L x W x D (cm) [2:24.622] [3:31.102] [N/A:N/A] A (cm) : rea [2:24.622] [3:9.331] [N/A:N/A] Volume (cm) : [2:-132.20%] [3:-63.60%] [N/A:N/A] % Reduction in A rea: [2:-1060.90%] [3:45.50%] [N/A:N/A] % Reduction in Volume: [2:Grade 2] [3:Grade 2] [N/A:N/A] Classification: [2:Large] [3:Large] [N/A:N/A] Exudate A mount: [2:Purulent] [3:Serosanguineous] [N/A:N/A] Exudate Type: [2:yellow, brown, green] [3:red, brown] [N/A:N/A] Exudate Color: [2:Well defined, not attached] [3:Well defined, not attached] [N/A:N/A] Wound Margin: [2:Small (1-33%)] [3:Medium (34-66%)] [N/A:N/A] Granulation A mount: [2:Pink] [3:Pink] [N/A:N/A] Granulation Quality: [2:Large (67-100%)] [3:Medium (34-66%)] [N/A:N/A] Necrotic A mount: [2:Eschar, Adherent Slough] [3:Eschar, Adherent Slough] [N/A:N/A] Necrotic Tissue: [2:Fat Layer (Subcutaneous Tissue): Yes Fat Layer (Subcutaneous Tissue): Yes N/A] Exposed Structures: [2:Fascia: No Tendon: No Muscle: No Joint: No Bone: No Small (1-33%)] [3:Fascia: No  Tendon: No Muscle: No Joint: No Bone: No Small (1-33%)] [N/A:N/A] Epithelialization: [2:Debridement - Excisional] [3:Debridement - Excisional] [N/A:N/A] Debridement: Pre-procedure Verification/Time Out 15:55 [3:15:55] [N/A:N/A] Taken: [2:Subcutaneous, Slough] [3:Subcutaneous, Slough] [N/A:N/A] Tissue Debrided: [2:Skin/Subcutaneous Tissue] [3:Skin/Subcutaneous Tissue] [N/A:N/A] Level: [2:4] [3:4] [N/A:N/A] Debridement A (sq cm): [2:rea Blade, Forceps] [3:Blade, Forceps] [N/A:N/A] Instrument: [2:Minimum] [3:Minimum] [N/A:N/A] Bleeding: [2:Pressure] [3:Pressure] [N/A:N/A] Hemostasis A chieved: [2:0] [3:0] [N/A:N/A] Procedural Pain: [2:0] [3:0] [N/A:N/A] Post Procedural Pain: [2:Procedure was tolerated well] [3:Procedure was tolerated well] [N/A:N/A] Debridement Treatment Response: [2:5.5x5.7x1] [3:6x6.6x0.3] [N/A:N/A] Post Debridement Measurements L x W x D (cm) [2:24.622] [3:9.331] [N/A:N/A] Post Debridement Volume: (cm) [2:Debridement] [3:Debridement] [N/A:N/A] Treatment Notes Electronic Signature(s) Signed: 08/25/2020 5:04:56 PM By: Kalman Shan DO Signed: 08/27/2020 5:57:05 PM By: Levan Hurst RN, BSN Entered By: Kalman Shan on 08/25/2020 16:17:37 -------------------------------------------------------------------------------- [place] Details Patient Name: Date of Service: [name] Mocha, MA [name] 08/25/2020 2:45 PM Medical Record Number: [id number] Patient Account Number: [id number] Date of Birth/Sex: Treating RN: Dec 09, 1965 (55 y.o. [name] [name] Primary Care Landri Dorsainvil: [name] [name] Other Clinician: Referring Brysyn Brandenberger: Treating Leonard Feigel/Extender: [name] [name] in Treatment: 7 Multidisciplinary Care Plan reviewed with physician Active Inactive Abuse / Safety / Falls / Self Care Management Nursing Diagnoses: Potential for falls Potential for injury related to falls Goals: Patient will not experience any injury  related to falls Date Initiated: 07/01/2020 Target Resolution Date: 09/05/2020 Goal Status: Active Patient/caregiver will verbalize/demonstrate measures taken to prevent injury and/or falls Date Initiated: 07/01/2020 Date Inactivated: 08/11/2020 Target Resolution Date: 08/08/2020 Goal Status: Met Interventions: Assess Activities of Daily Living upon admission and as needed Assess fall risk on admission and as needed Assess: immobility, friction, shearing, incontinence upon admission and as needed Assess impairment of mobility on admission and as needed per policy Assess personal safety and home safety (as indicated) on admission and as needed Provide education on fall prevention Provide education on personal and home safety Notes: Nutrition Nursing Diagnoses: Impaired glucose control: actual or potential Potential for alteratiion in Nutrition/Potential for imbalanced nutrition Goals: Patient/caregiver agrees to and verbalizes understanding of need to use nutritional supplements and/or vitamins as prescribed Date Initiated: 07/01/2020 Date Inactivated: 08/11/2020 Target Resolution Date: 08/08/2020 Goal Status: Met Patient/caregiver will maintain therapeutic glucose control Date Initiated: 07/01/2020 Target Resolution Date: 09/05/2020 Goal Status: Active Interventions: Assess HgA1c results as ordered upon admission and as needed Assess patient nutrition upon admission and as needed per policy Provide education on elevated blood sugars and impact on wound healing Provide education on nutrition Treatment Activities: Education provided on Nutrition : 07/01/2020 Notes: Wound/Skin Impairment Nursing Diagnoses: Impaired tissue integrity Knowledge deficit related to ulceration/compromised skin integrity Goals: Patient/caregiver will verbalize understanding of skin care regimen Date Initiated:  07/01/2020 Target Resolution Date: 09/05/2020 Goal Status: Active Ulcer/skin breakdown will have a  volume reduction of 30% by week 4 Date Initiated: 07/01/2020 Date Inactivated: 08/11/2020 Target Resolution Date: 08/08/2020 Unmet Reason: eschar requiring Goal Status: Unmet multiple debridements Ulcer/skin breakdown will have a volume reduction of 50% by week 8 Date Initiated: 08/11/2020 Target Resolution Date: 09/05/2020 Goal Status: Active Interventions: Assess patient/caregiver ability to obtain necessary supplies Assess patient/caregiver ability to perform ulcer/skin care regimen upon admission and as needed Assess ulceration(s) every visit Provide education on ulcer and skin care Notes: Electronic Signature(s) Signed: 08/27/2020 5:57:05 PM By: Levan Hurst RN, BSN Entered By: Levan Hurst on 08/25/2020 17:28:57 -------------------------------------------------------------------------------- Pain Assessment Details Patient Name: Date of Service: [name] [name], [place] [name] 08/25/2020 2:45 PM Medical Record Number: [id number] Patient Account Number: [id number] Date of Birth/Sex: Treating RN: 12/06/1965 (55 y.o. [name] [name] Primary Care Valerya Maxton: [name] [name] Other Clinician: Referring Flordia Kassem: Treating Lovelle Deitrick/Extender: [name] [name] in Treatment: 7 Active Problems Location of Pain Severity and Description of Pain Patient Has Paino Yes Site Locations Pain Location: Pain in Ulcers With Dressing Change: Yes Duration of the Pain. Constant / Intermittento Intermittent Rate the pain. Current Pain Level: 8 Character of Pain Describe the Pain: Burning, Throbbing Pain Management and Medication Current Pain Management: Medication: Yes Cold Application: No Rest: Yes Massage: No Activity: No T.E.N.S.: No Heat Application: No Leg drop or elevation: No Is the Current Pain Management Adequate: Inadequate How does your wound impact your activities of daily livingo Sleep: No Bathing: No Appetite: No Relationship With Others: No Bladder  Continence: No Emotions: No Bowel Continence: No Work: No Toileting: No Drive: No Dressing: No Hobbies: No Electronic Signature(s) Signed: 08/25/2020 5:38:29 PM By: Lorrin Jackson Entered By: Lorrin Jackson on 08/25/2020 15:35:31 -------------------------------------------------------------------------------- Patient/Caregiver Education Details Patient Name: Date of Service: [name] [name], Jacklynn Bue 6/6/2022andnbsp2:45 PM Medical Record Number: [id number] Patient Account Number: [id number] Date of Birth/Gender: Treating RN: [birth year] (55 y.o. [name] [name] Primary Care Physician: [name] [name] Other Clinician: Referring Physician: Treating Physician/Extender: [name] [name] in Treatment: 7 Education Assessment Education Provided To: Patient Education Topics Provided Wound/Skin Impairment: Methods: Explain/Verbal Responses: State content correctly [employer]) Signed: 08/27/2020 5:57:05 PM By: Levan Hurst RN, BSN Entered By: Levan Hurst on 08/25/2020 17:29:06 -------------------------------------------------------------------------------- Wound Assessment Details Patient Name: Date of Service: [name] Mocha, MA [place] L. 08/25/2020 2:45 PM Medical Record Number: [id number] Patient Account Number: [id number] Date of Birth/Sex: Treating RN: 11-24-1965 (55 y.o. [name] [name] Primary Care Amya Hlad: [name] [name] Other Clinician: Referring Oren Barella: Treating Svetlana Bagby/Extender: [name] [name] in Treatment: 7 Wound Status Wound Number: 2 Primary Diabetic Wound/Ulcer of the Lower Extremity Etiology: Wound Location: Right Calcaneus Wound Open Wounding Event: Pressure Injury Status: Date Acquired: 04/22/2020 Comorbid Anemia, Congestive Heart Failure, Hypertension, Type II Diabetes, Weeks Of Treatment: 7 History: End Stage Renal Disease, Raynauds, Neuropathy Clustered Wound: No Photos Wound  Measurements Length: (cm) 5.5 Width: (cm) 5.7 Depth: (cm) 1 Area: (cm) 24.622 Volume: (cm) 24.622 % Reduction in Area: -132.2% % Reduction in Volume: -1060.9% Epithelialization: Small (1-33%) Tunneling: No Undermining: No Wound Description Classification: Grade 2 Wound Margin: Well defined, not attached Exudate Amount: Large Exudate Type: Purulent Exudate Color: yellow, brown, green Foul Odor After Cleansing: No Slough/Fibrino Yes Wound Bed Granulation Amount: Small (1-33%) Exposed Structure Granulation Quality: Pink Fascia Exposed: No Necrotic Amount: Large (67-100%) Fat Layer (Subcutaneous Tissue) Exposed: Yes Necrotic Quality: Eschar, Adherent Slough Tendon Exposed: No Muscle Exposed: No  Joint Exposed: No Bone Exposed: No Treatment Notes Wound #2 (Calcaneus) Wound Laterality: Right Cleanser Wound Cleanser Discharge Instruction: Cleanse the wound with wound cleanser or normal saline prior to applying a clean dressing using gauze sponges, not tissue or cotton balls. Normal Saline Discharge Instruction: Cleanse the wound with Normal Saline prior to applying a clean dressing using gauze sponges, not tissue or cotton balls. Peri-Wound Care Topical Primary Dressing Santyl Ointment Discharge Instruction: Apply Santyl today in clinic. Restart Santyl at home once available. Hydrofera Blue Ready Foam, 4x5 in Discharge Instruction: Apply Hydrofera over Santyl Secondary Dressing Woven Gauze Sponge, Non-Sterile 4x4 in Discharge Instruction: Apply over primary dressing as directed. ALLEVYN Heel 4 1/2in x 5 1/2in / 10.5cm x 13.5cm Discharge Instruction: Apply over primary dressing as directed. Secured With [employer], 4.5x3.1 (in/yd) Discharge Instruction: Secure with Kerlix as directed. Paper Tape, 2x10 (in/yd) Discharge Instruction: Secure dressing with tape as directed. Compression Wrap Compression Stockings Add-Ons Electronic Signature(s) Signed: 08/26/2020  2:13:29 PM By: Sandre Kitty Signed: 08/26/2020 5:30:54 PM By: Lorrin Jackson Previous Signature: 08/25/2020 5:38:29 PM Version By: Lorrin Jackson Entered By: Sandre Kitty on 08/26/2020 14:08:45 -------------------------------------------------------------------------------- Wound Assessment Details Patient Name: Date of Service: [name] Mocha, MA [place] L. 08/25/2020 2:45 PM Medical Record Number: [id number] Patient Account Number: [id number] Date of Birth/Sex: Treating RN: [birth year] (55 y.o. [name] [name] Primary Care Donalyn Schneeberger: [name] [name] Other Clinician: Referring Jazir Newey: Treating Marriah Sanderlin/Extender: [name] [name] in Treatment: 7 Wound Status Wound Number: 3 Primary Diabetic Wound/Ulcer of the Lower Extremity Etiology: Wound Location: Left Calcaneus Wound Open Wounding Event: Pressure Injury Status: Date Acquired: 04/22/2020 Comorbid Anemia, Congestive Heart Failure, Hypertension, Type II Diabetes, Weeks Of Treatment: 7 Weeks Of Treatment: 7 History: End Stage Renal Disease, Raynauds, Neuropathy Clustered Wound: No Photos Wound Measurements Length: (cm) 6 Width: (cm) 6.6 Depth: (cm) 0.3 Area: (cm) 31.102 Volume: (cm) 9.331 % Reduction in Area: -63.6% % Reduction in Volume: 45.5% Epithelialization: Small (1-33%) Tunneling: No Undermining: No Wound Description Classification: Grade 2 Wound Margin: Well defined, not attached Exudate Amount: Large Exudate Type: Serosanguineous Exudate Color: red, brown Foul Odor After Cleansing: No Slough/Fibrino Yes Wound Bed Granulation Amount: Medium (34-66%) Exposed Structure Granulation Quality: Pink Fascia Exposed: No Necrotic Amount: Medium (34-66%) Fat Layer (Subcutaneous Tissue) Exposed: Yes Necrotic Quality: Eschar, Adherent Slough Tendon Exposed: No Muscle Exposed: No Joint Exposed: No Bone Exposed: No Treatment Notes Wound #3 (Calcaneus) Wound Laterality: Left Cleanser Wound  Cleanser Discharge Instruction: Cleanse the wound with wound cleanser or normal saline prior to applying a clean dressing using gauze sponges, not tissue or cotton balls. Normal Saline Discharge Instruction: Cleanse the wound with Normal Saline prior to applying a clean dressing using gauze sponges, not tissue or cotton balls. Peri-Wound Care Topical Primary Dressing Santyl Ointment Discharge Instruction: Apply Santyl today in clinic. Restart Santyl at home once available. Hydrofera Blue Ready Foam, 4x5 in Discharge Instruction: Apply Hydrofera over Santyl Secondary Dressing Woven Gauze Sponge, Non-Sterile 4x4 in Discharge Instruction: Apply over primary dressing as directed. ALLEVYN Heel 4 1/2in x 5 1/2in / 10.5cm x 13.5cm Discharge Instruction: Apply over primary dressing as directed. Secured With [employer], 4.5x3.1 (in/yd) Discharge Instruction: Secure with Kerlix as directed. Paper Tape, 2x10 (in/yd) Discharge Instruction: Secure dressing with tape as directed. Compression Wrap Compression Stockings Add-Ons Electronic Signature(s) Signed: 08/26/2020 2:13:29 PM By: Sandre Kitty Signed: 08/26/2020 5:30:54 PM By: Lorrin Jackson Previous Signature: 08/25/2020 5:38:29 PM Version By: Lorrin Jackson Entered By: Sandre Kitty on 08/26/2020  14:08:22 -------------------------------------------------------------------------------- Wound Assessment Details Patient Name: Date of Service: [name] [name], [name] 08/25/2020 2:45 PM Medical Record Number: [id number] Patient Account Number: [id number] Date of Birth/Sex: Treating RN: 24-Mar-1965 (55 y.o. [name] [name] Primary Care Reaghan Kawa: [name] [name] Other Clinician: Referring Blakeley Margraf: Treating Aasha Dina/Extender: [name] [name] in Treatment: 7 Wound Status Wound Number: 5 Primary Pressure Ulcer Etiology: Wound Location: Left Gluteal fold Wound Open Wounding Event:  Shear/Friction Status: Date Acquired: 08/22/2020 Comorbid Anemia, Congestive Heart Failure, Hypertension, Type II Diabetes, Weeks Of Treatment: 0 History: End Stage Renal Disease, Raynauds, Neuropathy Clustered Wound: No Wound Measurements Length: (cm) 2 Width: (cm) 2 Depth: (cm) 0.1 Area: (cm) 3.142 Volume: (cm) 0.314 % Reduction in Area: % Reduction in Volume: Epithelialization: None Tunneling: No Undermining: No Wound Description Classification: Category/Stage II Wound Margin: Distinct, outline attached Exudate Amount: Medium Exudate Type: Serosanguineous Exudate Color: red, brown Foul Odor After Cleansing: No Slough/Fibrino No Wound Bed Granulation Amount: Large (67-100%) Exposed Structure Granulation Quality: Red, Pink Fascia Exposed: No Necrotic Amount: None Present (0%) Fat Layer (Subcutaneous Tissue) Exposed: Yes Tendon Exposed: No Muscle Exposed: No Joint Exposed: No Bone Exposed: No Treatment Notes Wound #5 (Gluteal fold) Wound Laterality: Left Cleanser Soap and Water Discharge Instruction: May shower and wash wound with dial antibacterial soap and water prior to dressing change. Peri-Wound Care Topical Antibiotic Ointment Primary Dressing Secondary Dressing Bordered Gauze, 2x3.75 in Discharge Instruction: Apply over primary dressing as directed. Secured With Compression Wrap Compression Stockings Environmental education officer) Signed: 08/25/2020 5:26:44 PM By: Lorrin Jackson Entered By: Lorrin Jackson on 08/25/2020 17:26:44 -------------------------------------------------------------------------------- [place] Details Patient Name: Date of Service: [name] Mocha, MA [place] L. 08/25/2020 2:45 PM Medical Record Number: [id number] Patient Account Number: [id number] Date of Birth/Sex: Treating RN: [birth year] (55 y.o. [name] [name] Primary Care Takisha Pelle: [name] [name] Other Clinician: Referring Javonnie Illescas: Treating Solomiya Pascale/Extender: [name] [name] in Treatment: 7 Vital Signs Time Taken: 15:28 Temperature (F): 98.8 Height (in): 65 Pulse (bpm): 118 Weight (lbs): 240 Respiratory Rate (breaths/min): 20 Body Mass Index (BMI): 39.9 Blood Pressure (mmHg): 127/77 Capillary Blood Glucose (mg/dl): 138 Reference Range: 80 - 120 mg / dl Electronic Signature(s) Signed: 08/25/2020 5:38:29 PM By: Lorrin Jackson Entered By: Lorrin Jackson on 08/25/2020 15:34:55

## 2020-08-27 NOTE — Progress Notes (Signed)
[name] [name], [name] [name] ([id number]) Visit Report for 08/25/2020 Chief Complaint Document Details Patient Name: Date of Service: [place], [place] 08/25/2020 2:45 PM Medical Record Number: [id number] Patient Account Number: [id number] Date of Birth/Sex: Treating RN: February 03, 1966 (55 y.o. [name] [name] Primary Care Provider: Vincente [name] Other Clinician: Referring Provider: Treating Provider/Extender: [name] [name] in Treatment: 7 Information Obtained from: Patient Chief Complaint Bilateral heel wounds Electronic Signature(s) Signed: 08/25/2020 5:04:56 PM By: Kalman Shan DO Entered By: Kalman Shan on 08/25/2020 16:17:45 -------------------------------------------------------------------------------- Debridement Details Patient Name: Date of Service: [name] [name], Seabeck Enders L. 08/25/2020 2:45 PM Medical Record Number: [id number] Patient Account Number: [id number] Date of Birth/Sex: Treating RN: 1965-09-02 (55 y.o. [name] [name] Primary Care Provider: Vincente [name] Other Clinician: Referring Provider: Treating Provider/Extender: [name] [name] in Treatment: 7 Debridement Performed for Assessment: Wound #2 Right Calcaneus Performed By: Physician Kalman Shan, DO Debridement Type: Debridement Severity of Tissue Pre Debridement: Fat layer exposed Level of Consciousness (Pre-procedure): Awake and Alert Pre-procedure Verification/Time Out Yes - 15:55 Taken: Start Time: 15:55 T Area Debrided (L x W): otal 2 (cm) x 2 (cm) = 4 (cm) Tissue and other material debrided: Non-Viable, Slough, Subcutaneous, Slough Level: Skin/Subcutaneous Tissue Debridement Description: Excisional Instrument: Blade, Forceps Bleeding: Minimum Hemostasis Achieved: Pressure End Time: 15:56 Procedural Pain: 0 Post Procedural Pain: 0 Response to Treatment: Procedure was tolerated well Level of Consciousness (Post- Awake and  Alert procedure): Post Debridement Measurements of Total Wound Length: (cm) 5.5 Width: (cm) 5.7 Depth: (cm) 1 Volume: (cm) 24.622 Character of Wound/Ulcer Post Debridement: Requires Further Debridement Severity of Tissue Post Debridement: Fat layer exposed Post Procedure Diagnosis Same as Pre-procedure Electronic Signature(s) Signed: 08/25/2020 5:04:56 PM By: Kalman Shan DO Signed: 08/27/2020 5:57:05 PM By: [name] Hurst RN, BSN Entered By: [name] [name] on 08/25/2020 16:01:58 -------------------------------------------------------------------------------- Debridement Details Patient Name: Date of Service: [name] Mocha, MA [name] 08/25/2020 2:45 PM Medical Record Number: [id number] Patient Account Number: [id number] Date of Birth/Sex: Treating RN: [birth year] (55 y.o. [name] [name] Primary Care Provider: Vincente [name] Other Clinician: Referring Provider: Treating Provider/Extender: [name] [name] in Treatment: 7 Debridement Performed for Assessment: Wound #3 Left Calcaneus Performed By: Physician Kalman Shan, DO Debridement Type: Debridement Severity of Tissue Pre Debridement: Fat layer exposed Level of Consciousness (Pre-procedure): Awake and Alert Pre-procedure Verification/Time Out Yes - 15:55 Taken: Start Time: 15:55 T Area Debrided (L x W): otal 2 (cm) x 2 (cm) = 4 (cm) Tissue and other material debrided: Non-Viable, Slough, Subcutaneous, Slough Level: Skin/Subcutaneous Tissue Debridement Description: Excisional Instrument: Blade, Forceps Bleeding: Minimum Hemostasis Achieved: Pressure End Time: 15:56 Procedural Pain: 0 Post Procedural Pain: 0 Response to Treatment: Procedure was tolerated well Level of Consciousness (Post- Awake and Alert procedure): Post Debridement Measurements of Total Wound Length: (cm) 6 Width: (cm) 6.6 Depth: (cm) 0.3 Volume: (cm) 9.331 Character of Wound/Ulcer Post Debridement: Requires  Further Debridement Severity of Tissue Post Debridement: Fat layer exposed Post Procedure Diagnosis Same as Pre-procedure Electronic Signature(s) Signed: 08/25/2020 5:04:56 PM By: Kalman Shan DO Signed: 08/27/2020 5:57:05 PM By: [name] Hurst RN, BSN Entered By: [name] [name] on 08/25/2020 16:02:07 -------------------------------------------------------------------------------- HPI Details Patient Name: Date of Service: [name] Mocha, MA [name] 08/25/2020 2:45 PM Medical Record Number: [id number] Patient Account Number: [id number] Date of Birth/Sex: Treating RN: 05-May-1965 (55 y.o. [name] [name] Primary Care Provider: Vincente [name] Other Clinician: Referring Provider: Treating Provider/Extender: [name] [name] in Treatment: 7 History of Present Illness HPI  Description: 4/13 - [name] [name] is a 55 year old male with past medical history of insulin-dependent type 2 diabetes, end-stage renal disease on hemodialysis, Raynaud's disease with bilateral hand and fingertip ulcers and essential hypertension that presents with bilateral heel ulcers. This has been an ongoing issue for the patient and has been following closely with podiatry. He had debridement and graft application preformed on 06/04/2020 due to worsening of the wounds. Imaging suggested osteo and he was treated with vancomycin and amoxicillin. Today he presents for further evaluation of the wounds. He is currently using Santyl on these areas daily. He uses a cam boot on the left and a surgical shoe on the right to help with offloading. He denies any acute pain, fever/chills, increased warmth or erythema to the feet or purulent drainage. 5/9; unfortunately patient has not followed up to his regular appointments. Its been almost a month since I last saw him. He has been using Santyl daily to the wound beds. He states that he sits a lot in his chair with with his feet on the ground. He reports walking  around a lot in the day doing his activities of daily living. He denies acute signs of infection. 5/23; patient presents for his 2-week follow-up. He has been using Santyl daily to the wounds and has been trying to offload his heels throughout the day. He states he has to sleep in a wheelchair and occasionally is able to elevate his legs. He denies acute signs of infection. 6/1; patient presents for 1 week follow-up. He has been using Santyl to the wounds however only has a little left and refills are not until 6/14. He was discharged as well by home health for unknown reasons. He continues to sleep in a wheelchair and cannot offload his heels. He states he is receiving a hospital bed today. He denies acute signs of infection. 6/6; patient presents for 1 week follow-up. He had just enough Santyl to cover to yesterday but does not have a refill until 6/14. He did not receive the supplies from home health. Despite having a hospital bed he continues to sleep in a wheelchair and cannot offload his heels. He denies acute signs of infection. He also developed a small wound to the area between his posterior thigh and buttocks. He has been placing a Band-Aid on this. Electronic Signature(s) Signed: 08/25/2020 5:04:56 PM By: Kalman Shan DO Entered By: Kalman Shan on 08/25/2020 16:56:33 -------------------------------------------------------------------------------- Physical Exam Details Patient Name: Date of Service: [name] Mocha, MA [place] L. 08/25/2020 2:45 PM Medical Record Number: [id number] Patient Account Number: [id number] Date of Birth/Sex: Treating RN: 06-03-1965 (55 y.o. [name] [name] Primary Care Provider: Vincente [name] Other Clinician: Referring Provider: Treating Provider/Extender: [name] [name] in Treatment: 7 Constitutional respirations regular, non-labored and within target range for patient.[name] Psychiatric pleasant and cooperative. Notes Left and  right calcaneus with nonviable tissue present right greater than left. He now has a small open wound to the left upper posterior thigh with granulation tissue present. No signs of infection. Electronic Signature(s) Signed: 08/25/2020 5:04:56 PM By: Kalman Shan DO Entered By: Kalman Shan on 08/25/2020 16:57:24 -------------------------------------------------------------------------------- Physician Orders Details Patient Name: Date of Service: [name] Mocha, MA Normal L. 08/25/2020 2:45 PM Medical Record Number: [id number] Patient Account Number: [id number] Date of Birth/Sex: Treating RN: 09/19/1965 (55 y.o. 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 Bathing/ Shower/ Hygiene May shower and wash wound with soap and water. Edema Control - Lymphedema / SCD / Other Elevate legs to the level of the heart or above for 30 minutes daily and/or when sitting, a frequency of: - throughout the day Avoid standing for long periods of time. Exercise regularly Off-Loading Open toe surgical shoe to: - both [place] to Home Health for wound care. May utilize formulary equivalent dressing for wound treatment orders unless otherwise specified. Dressing changes to be completed by [facility] on Monday / Wednesday / Friday except when patient has scheduled visit at [facility]. Wound Treatment Wound #2  - Calcaneus Wound Laterality: Right Cleanser: Normal Saline (Generic) Every Other Day/15 Days Discharge Instructions: Cleanse the wound with Normal Saline prior to applying a clean dressing using gauze sponges, not tissue or cotton balls. Cleanser: Wound Cleanser (DME) (Generic) Every Other Day/15 Days Discharge Instructions: Cleanse the wound with wound cleanser or normal saline prior to applying a clean dressing using gauze sponges, not tissue or cotton balls. Prim Dressing: Hydrofera Blue Ready Foam, 4x5 in (DME) (Generic) Every Other Day/15 Days ary Discharge Instructions: Apply Hydrofera over Santyl Prim Dressing: Santyl Ointment Every Other Day/15 Days ary Discharge Instructions: Apply Santyl today in clinic. Restart Santyl at home once available. Secondary Dressing: Woven Gauze Sponge, Non-Sterile 4x4 in (DME) (Generic) Every Other Day/15 Days Discharge Instructions: Apply over primary dressing as directed. Secondary Dressing: ALLEVYN Heel 4 1/2in x 5 1/2in / 10.5cm x 13.5cm (DME) (Generic) Every Other Day/15 Days Discharge Instructions: Apply over primary dressing as directed. Secured With: [employer], 4.5x3.1 (in/yd) (DME) (Generic) Every Other Day/15 Days Discharge Instructions: Secure with Kerlix as directed. Secured With: Paper Tape, 2x10 (in/yd) (DME) (Generic) Every Other Day/15 Days Discharge Instructions: Secure dressing with tape as directed. Wound #3 - Calcaneus Wound Laterality: Left Cleanser: Normal Saline (Generic) Every Other Day/15 Days Discharge Instructions: Cleanse the wound with Normal Saline prior to applying a clean dressing using gauze sponges, not tissue or cotton balls. Cleanser: Wound Cleanser (DME) (Generic) Every Other Day/15 Days Discharge Instructions: Cleanse the wound with wound cleanser or normal saline prior to applying a clean dressing using gauze sponges, not tissue or cotton balls. Prim Dressing: Hydrofera Blue Ready Foam, 4x5 in (DME)  (Generic) Every Other Day/15 Days ary Discharge Instructions: Apply Hydrofera over Santyl Prim Dressing: Santyl Ointment Every Other Day/15 Days ary Discharge Instructions: Apply Santyl today in clinic. Restart Santyl at home once available. Secondary Dressing: Woven Gauze Sponge, Non-Sterile 4x4 in (DME) (Generic) Every Other Day/15 Days Discharge Instructions: Apply over primary dressing as directed. Secondary Dressing: ALLEVYN Heel 4 1/2in x 5 1/2in / 10.5cm x 13.5cm (DME) (Generic) Every Other Day/15 Days Discharge Instructions: Apply over primary dressing as directed. Secured With: [employer], 4.5x3.1 (in/yd) (DME) (Generic) Every Other Day/15 Days Discharge Instructions: Secure with Kerlix as directed. Secured With: Paper Tape, 2x10 (in/yd) (DME) (Generic) Every Other Day/15 Days Discharge Instructions: Secure dressing with tape as directed. Wound #5 - Gluteal fold Wound Laterality: Left Cleanser: Soap and Water 1 x Per Day/30 Days Discharge Instructions: May shower and wash wound with dial  antibacterial soap and water prior to dressing change. Topical: Antibiotic Ointment 1 x Per Day/30 Days Secondary Dressing: Bordered Gauze, 2x3.75 in 1 x Per Day/30 Days Discharge Instructions: Apply over primary dressing as directed. Electronic Signature(s) Signed: 08/25/2020 5:38:29 PM By: Lorrin Jackson Signed: 08/26/2020 10:55:01 AM By: Kalman Shan DO Previous Signature: 08/25/2020 5:04:56 PM Version By: Kalman Shan DO Entered By: Lorrin Jackson on 08/25/2020 17:28:15 -------------------------------------------------------------------------------- Problem List Details Patient Name: Date of Service: [name] Mocha, MA [place] L. 08/25/2020 2:45 PM Medical Record Number: [id number] Patient Account Number: [id number] Date of Birth/Sex: Treating RN: 03-16-1966 (55 y.o. 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 08/25/2020 2:45 PM Medical Record Number: [id number] Patient Account Number: [id number] Date of Birth/Sex: Treating RN: [birth year] (55 y.o. [name] [name] Primary Care Provider: Vincente [name] Other Clinician: Referring Provider: Treating Provider/Extender: [name] [name] in Treatment: 7 Subjective Chief Complaint Information obtained from Patient Bilateral heel wounds History of Present Illness (HPI) 4/13 - Emilian Hinnenkamp is a 55 year old male with past medical history of insulin-dependent type 2 diabetes, end-stage renal disease on hemodialysis, Raynaud's disease with bilateral hand and fingertip ulcers and essential hypertension that presents with bilateral heel ulcers. This has been an ongoing issue for the patient and has been following closely with podiatry. He had debridement and graft application preformed on 06/04/2020 due to worsening of the wounds. Imaging suggested osteo and he was treated with vancomycin and amoxicillin. Today he presents for further evaluation of  the wounds. He is currently using Santyl on these areas daily. He uses a cam boot on the left and a surgical shoe on the right to help with offloading. He denies any acute pain, fever/chills, increased warmth or erythema to the feet or purulent drainage. 5/9; unfortunately patient has not followed up to his regular appointments. Its been almost a month since I last saw him. He has been using Santyl daily to the wound beds. He states that he sits a lot in his chair with with his feet on the ground. He reports walking around a lot in the day doing his activities of daily living. He denies acute signs of infection. 5/23; patient presents for his 2-week follow-up. He has been using Santyl daily to the wounds and has been trying to offload his heels throughout the day. He states he has to sleep in a wheelchair and occasionally is able to elevate his legs. He denies acute signs of infection. 6/1; patient presents for 1 week follow-up. He has been using Santyl to the wounds however only has a little left and refills are not until 6/14. He was discharged as well by home health for unknown reasons. He continues to sleep in a wheelchair and cannot offload his heels. He states he is receiving  a hospital bed today. He denies acute signs of infection. 6/6; patient presents for 1 week follow-up. He had just enough Santyl to cover to yesterday but does not have a refill until 6/14. He did not receive the supplies from home health. Despite having a hospital bed he continues to sleep in a wheelchair and cannot offload his heels. He denies acute signs of infection. He also developed a small wound to the area between his posterior thigh and buttocks. He has been placing a Band-Aid on this. Patient History Information obtained from Patient. Family History Unknown History. Social History Never smoker, Marital Status - Single, Alcohol Use - Rarely, Drug Use - No History, Caffeine Use - Rarely. 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 Vitals Time Taken: 3:28 PM, Height: 65 in, Weight: 240 lbs, BMI: 39.9, Temperature: 98.8 F, Pulse: 118 bpm, Respiratory Rate: 20 breaths/min, Blood Pressure: 127/77 mmHg, Capillary Blood Glucose: 138 mg/dl. Psychiatric pleasant and cooperative. General Notes: Left and right calcaneus with nonviable tissue present right greater than left. He now has a small open wound to the left upper posterior thigh with granulation tissue present. No signs of infection. Integumentary (Hair, Skin) Wound #2 status is Open. Original cause of wound was Pressure Injury. The date acquired was: 04/22/2020. The wound has been in treatment 7 weeks. The wound is located on the Right Calcaneus. The wound measures 5.5cm length x 5.7cm width x 1cm depth; 24.622cm^2 area and 24.622cm^3 volume. There is Fat Layer (Subcutaneous Tissue) exposed. There is no tunneling or undermining noted. There is a large amount of purulent drainage noted. The wound margin is well defined and not attached to the wound base. There is small (1-33%) pink granulation within the wound bed. There is a large (67-100%) amount of necrotic tissue within the wound bed including Eschar and Adherent Slough. Wound #3 status is Open. Original cause of wound was Pressure Injury. The date acquired was: 04/22/2020. The wound has been in treatment 7  weeks. The wound is located on the Left Calcaneus. The wound measures 6cm length x 6.6cm width x 0.3cm depth; 31.102cm^2 area and 9.331cm^3 volume. There is Fat Layer (Subcutaneous Tissue) exposed. There is no tunneling or undermining noted. There is a large amount of serosanguineous drainage noted. The wound margin is well defined and not attached to the wound base. There is medium (34-66%) pink granulation within the wound bed. There is a medium (34-66%) amount of necrotic tissue within the wound bed including Eschar and Adherent Slough. 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 There is nonviable tissue and I attempted to debride this. Since he does not qualify for Santyl until the 14th I recommended using Hydrofera Blue. We will attempt to get him the supplies as they have not come in yet. We are also trying  to obtain home health for him. He has developed a small open wound to his upper posterior thigh that is limited to skin breakdown. I recommended using antibiotic ointment and keeping the area covered. This is likely pressure related from the fact that he is in his wheelchair all the time.No signs of infection.Follow-up in 1 week Procedures Wound #2 Pre-procedure diagnosis of Wound #2 is a Diabetic Wound/Ulcer of the Lower Extremity located on the Right Calcaneus .Severity of Tissue Pre Debridement is: Fat layer exposed. There was a Excisional Skin/Subcutaneous Tissue Debridement with a total area of 4 sq cm performed by Kalman Shan, DO. With the following instrument(s): Blade, and Forceps to remove Non-Viable tissue/material. Material removed  includes Subcutaneous Tissue and Slough and. No specimens were taken. A time out was conducted at 15:55, prior to the start of the procedure. A Minimum amount of bleeding was controlled with Pressure. The procedure was tolerated well with a pain level of 0 throughout and a pain level of 0 following the procedure. Post Debridement Measurements: 5.5cm length x 5.7cm width x 1cm depth; 24.622cm^3 volume. Character of Wound/Ulcer Post Debridement requires further debridement. Severity of Tissue Post Debridement is: Fat layer exposed. Post procedure Diagnosis Wound #2: Same as Pre-Procedure Wound #3 Pre-procedure diagnosis of Wound #3 is a Diabetic Wound/Ulcer of the Lower Extremity located on the Left Calcaneus .Severity of Tissue Pre Debridement is: Fat layer exposed. There was a Excisional Skin/Subcutaneous Tissue Debridement with a total area of 4 sq cm performed by Kalman Shan, DO. With the following instrument(s): Blade, and Forceps to remove Non-Viable tissue/material. Material removed includes Subcutaneous Tissue and Slough and. No specimens were taken. A time out was conducted at 15:55, prior to the start of the procedure. A Minimum amount of bleeding was controlled with Pressure. The procedure was tolerated well with a pain level of 0 throughout and a pain level of 0 following the procedure. Post Debridement Measurements: 6cm length x 6.6cm width x 0.3cm depth; 9.331cm^3 volume. Character of Wound/Ulcer Post Debridement requires further debridement. Severity of Tissue Post Debridement is: Fat layer exposed. Post procedure Diagnosis Wound #3: Same as Pre-Procedure Plan Follow-up Appointments: Return Appointment in 1 week. Bathing/ Shower/ Hygiene: May shower and wash wound with soap and water. Edema Control - Lymphedema / SCD / Other: Elevate legs to the level of the heart or above for 30 minutes daily and/or when sitting, a frequency of: - throughout the day Avoid standing for long  periods of time. Exercise regularly Off-Loading: Open toe surgical shoe to: - both feet Home Health: Admit to [facility] for wound care. May utilize formulary equivalent dressing for wound treatment orders unless otherwise specified. Dressing changes to be completed by [place] [facility] on Monday / Wednesday / Friday except when patient has scheduled visit at [facility]. WOUND #2: - Calcaneus Wound Laterality: Right Cleanser: Normal Saline (Generic) Every Other Day/15 Days Discharge Instructions: Cleanse the wound with Normal Saline prior to applying a clean dressing using gauze sponges, not tissue or cotton balls. Cleanser: Wound Cleanser (DME) (Generic) Every Other Day/15 Days Discharge Instructions: Cleanse the wound with wound cleanser or normal saline prior to applying a clean dressing using gauze sponges, not tissue or cotton balls. Prim Dressing: Hydrofera Blue Ready Foam, 4x5 in (DME) (Generic) Every Other Day/15 Days ary Discharge Instructions: Apply Hydrofera over Santyl Prim Dressing: Santyl Ointment Every Other Day/15 Days ary Discharge Instructions: Apply Santyl today in clinic. Restart Santyl at home once available. Secondary Dressing: Woven  Gauze Sponge, Non-Sterile 4x4 in (DME) (Generic) Every Other Day/15 Days Discharge Instructions: Apply over primary dressing as directed. Secondary Dressing: ALLEVYN Heel 4 1/2in x 5 1/2in / 10.5cm x 13.5cm (DME) (Generic) Every Other Day/15 Days Discharge Instructions: Apply over primary dressing as directed. Secured With: [employer], 4.5x3.1 (in/yd) (DME) (Generic) Every Other Day/15 Days Discharge Instructions: Secure with Kerlix as directed. Secured With: Paper T ape, 2x10 (in/yd) (DME) (Generic) Every Other Day/15 Days Discharge Instructions: Secure dressing with tape as directed. WOUND #3: - Calcaneus Wound Laterality: Left Cleanser: Normal Saline (Generic) Every Other Day/15 Days Discharge Instructions: Cleanse the  wound with Normal Saline prior to applying a clean dressing using gauze sponges, not tissue or cotton balls. Cleanser: Wound Cleanser (DME) (Generic) Every Other Day/15 Days Discharge Instructions: Cleanse the wound with wound cleanser or normal saline prior to applying a clean dressing using gauze sponges, not tissue or cotton balls. Prim Dressing: Hydrofera Blue Ready Foam, 4x5 in (DME) (Generic) Every Other Day/15 Days ary Discharge Instructions: Apply Hydrofera over Santyl Prim Dressing: Santyl Ointment Every Other Day/15 Days ary Discharge Instructions: Apply Santyl today in clinic. Restart Santyl at home once available. Secondary Dressing: Woven Gauze Sponge, Non-Sterile 4x4 in (DME) (Generic) Every Other Day/15 Days Discharge Instructions: Apply over primary dressing as directed. Secondary Dressing: ALLEVYN Heel 4 1/2in x 5 1/2in / 10.5cm x 13.5cm (DME) (Generic) Every Other Day/15 Days Discharge Instructions: Apply over primary dressing as directed. Secured With: [employer], 4.5x3.1 (in/yd) (DME) (Generic) Every Other Day/15 Days Discharge Instructions: Secure with Kerlix as directed. Secured With: Paper T ape, 2x10 (in/yd) (DME) (Generic) Every Other Day/15 Days Discharge Instructions: Secure dressing with tape as directed. 1. In office sharp debridement 2. Use Hydrofera Blue until he can obtain Santyl on the 14th and then switch to [place]. 3. Follow-up next week for continued debridement 4. Antibiotic to the posterior upper leg wound and keep covered. Offloading Electronic Signature(s) Signed: 08/25/2020 5:04:56 PM By: Kalman Shan DO Entered By: Kalman Shan on 08/25/2020 17:03:29 -------------------------------------------------------------------------------- HxROS Details Patient Name: Date of Service: [name] Mocha, MA [place]. 08/25/2020 2:45 PM Medical Record Number: [id number] Patient Account Number: [id number] Date of Birth/Sex: Treating RN: 1966/02/18 (55 y.o.  [name] [name] Primary Care Provider: Vincente [name] Other Clinician: Referring Provider: Treating Provider/Extender: [name] [name] in Treatment: 7 Information Obtained From Patient Hematologic/Lymphatic Medical History: Positive for: Anemia Cardiovascular Medical History: Positive for: Congestive Heart Failure; Hypertension Past Medical History Notes: Cardiomyopathy, arterial clots Endocrine Medical History: Positive for: Type II Diabetes Treated with: Insulin Blood sugar tested every day: Yes Tested : 2-3x a day Genitourinary Medical History: Positive for: End Stage Renal Disease Past Medical History Notes: Hemodiaylsis Immunological Medical History: Positive for: Raynauds Past Medical History Notes: 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 Neurologic Medical History: Positive for: Neuropathy Immunizations Pneumococcal Vaccine: Received Pneumococcal Vaccination: Yes Implantable Devices None Family and Social History Unknown History: Yes; Never smoker; Marital Status - Single; Alcohol Use: Rarely; Drug Use: No History; Caffeine Use: Rarely; Financial Concerns: No; Food, Clothing or Shelter Needs: No; Support System Lacking: No; Transportation Concerns: No Electronic Signature(s) Signed: 08/25/2020 5:04:56 PM By: Kalman Shan DO Signed: 08/27/2020 5:57:05 PM By: [name] Hurst RN, BSN Entered By: Kalman Shan on 08/25/2020 16:56:39 -------------------------------------------------------------------------------- [place] Details Patient Name: Date of Service: [name] Mocha, MA [place] L. 08/25/2020 Medical Record Number: [id number] Patient Account Number: [id number] Date of Birth/Sex: Treating RN: [birth year] (55 y.o. 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 Facility Procedures CPT4 Code: [id number] Description: [id number] - DEB SUBQ TISSUE 20 SQ CM/< ICD-10 Diagnosis Description L89.619 Pressure ulcer of right heel, unspecified stage L89.620 Pressure ulcer of left heel, unstageable Modifier: Quantity: 1 Physician Procedures : CPT4 Code Description Modifier [id number] - WC PHYS LEVEL 3 - EST PT ICD-10 Diagnosis Description L97.101 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

## 2020-08-28 DIAGNOSIS — D631 Anemia in chronic kidney disease: Secondary | ICD-10-CM | POA: Diagnosis not present

## 2020-08-28 DIAGNOSIS — N186 End stage renal disease: Secondary | ICD-10-CM | POA: Diagnosis not present

## 2020-08-28 DIAGNOSIS — N2581 Secondary hyperparathyroidism of renal origin: Secondary | ICD-10-CM | POA: Diagnosis not present

## 2020-08-29 NOTE — Progress Notes (Signed)
  Subjective:  Patient ID: [name] [name], male    DOB: 1965/05/14,  MRN: [id number]  55 y.o. male presents with at risk foot care with h/o NIDDM with ESRD on hemodialysis and painful thick toenails that are difficult to trim. Pain interferes with ambulation. Aggravating factors include wearing enclosed shoe gear. Pain is relieved with periodic professional debridement.  His mother is present during today's visit.   He is still under care of [facility] for bilateral heel ulcers. His mother is changing his wounds as instructed by Wound Care.  They state they are applying Collagenase to both wounds.  Patient's blood sugar was 158 mg/dl today.  PCP: [name], MD and last visit was: three weeks ago.  Review of Systems: Negative except as noted in the HPI.   No Known Allergies  Objective:  There were no vitals filed for this visit. Constitutional Patient is a pleasant 55 y.o. African American male morbidly obese in NAD. AAO x 3.  Vascular Capillary fill time to digits <3 seconds b/l lower extremities. Faintly palpable pedal pulses b/l. Pedal hair absent. Lower extremity skin temperature gradient within normal limits. +2 pitting edema b/l lower extremities. No cyanosis or clubbing noted.  Neurologic Normal speech. Protective sensation intact 5/5 intact bilaterally with 10g monofilament b/l.  Dermatologic Pedal skin with normal turgor, texture and tone bilaterally. No open wounds bilaterally. No interdigital macerations bilaterally. Toenails 1-5 b/l elongated, discolored, dystrophic, thickened, crumbly with subungual debris and tenderness to dorsal palpation. Dressings right heel is clean, dry and intact. Left foot dressing with mild breakthrough drainage. No odor. Wearing [place] shoes b/l.  Orthopedic: Normal muscle strength 5/5 to all lower extremity muscle groups bilaterally. No pain crepitus or joint limitation noted with ROM b/l. Pes planus deformity noted b/l.  Utilizes  wheelchair for mobility assistance.   Hemoglobin A1C Latest Ref Rng & Units 05/13/2020  HGBA1C 4.8 - 5.6 % 7.6(H)  Some recent data might be hidden   Assessment:   1. Pain due to onychomycosis of toenails of both feet   2. Type 2 diabetes mellitus with chronic kidney disease on chronic dialysis, with long-term current use of insulin ([place])    Plan:  Patient was evaluated and treated and all questions answered.  Onychomycosis with pain -Nails palliatively debridement as below. -Educated on self-care  Procedure: Nail Debridement Rationale: Pain Type of Debridement: manual, sharp debridement. Instrumentation: Nail nipper, rotary burr. Number of Nails: 10  -Examined patient. -Patient to continue soft, supportive shoe gear daily. -Toenails 1-5 b/l were debrided in length and girth with sterile nail nippers and dremel without iatrogenic bleeding.  -Patient to report any pedal injuries to medical professional immediately. -He is being followed by Wound Care for bilateral heel ulcers . -Patient/POA to call should there be question/concern in the interim.  Return in about 3 months (around 11/25/2020).  Marzetta Board, DPM

## 2020-08-30 DIAGNOSIS — D631 Anemia in chronic kidney disease: Secondary | ICD-10-CM | POA: Diagnosis not present

## 2020-08-30 DIAGNOSIS — N186 End stage renal disease: Secondary | ICD-10-CM | POA: Diagnosis not present

## 2020-08-30 DIAGNOSIS — N2581 Secondary hyperparathyroidism of renal origin: Secondary | ICD-10-CM | POA: Diagnosis not present

## 2020-09-01 ENCOUNTER — Encounter (HOSPITAL_BASED_OUTPATIENT_CLINIC_OR_DEPARTMENT_OTHER): Payer: Medicare Other | Admitting: Internal Medicine

## 2020-09-01 ENCOUNTER — Other Ambulatory Visit: Payer: Self-pay

## 2020-09-01 DIAGNOSIS — L89302 Pressure ulcer of unspecified buttock, stage 2: Secondary | ICD-10-CM | POA: Diagnosis not present

## 2020-09-01 DIAGNOSIS — L89152 Pressure ulcer of sacral region, stage 2: Secondary | ICD-10-CM | POA: Diagnosis not present

## 2020-09-01 DIAGNOSIS — E11621 Type 2 diabetes mellitus with foot ulcer: Secondary | ICD-10-CM | POA: Diagnosis not present

## 2020-09-01 DIAGNOSIS — L98492 Non-pressure chronic ulcer of skin of other sites with fat layer exposed: Secondary | ICD-10-CM | POA: Diagnosis not present

## 2020-09-01 DIAGNOSIS — L97412 Non-pressure chronic ulcer of right heel and midfoot with fat layer exposed: Secondary | ICD-10-CM | POA: Diagnosis not present

## 2020-09-01 DIAGNOSIS — L97811 Non-pressure chronic ulcer of other part of right lower leg limited to breakdown of skin: Secondary | ICD-10-CM | POA: Diagnosis not present

## 2020-09-01 DIAGNOSIS — L97422 Non-pressure chronic ulcer of left heel and midfoot with fat layer exposed: Secondary | ICD-10-CM | POA: Diagnosis not present

## 2020-09-01 DIAGNOSIS — L89619 Pressure ulcer of right heel, unspecified stage: Secondary | ICD-10-CM | POA: Diagnosis not present

## 2020-09-01 DIAGNOSIS — L89620 Pressure ulcer of left heel, unstageable: Secondary | ICD-10-CM | POA: Diagnosis not present

## 2020-09-02 DIAGNOSIS — N186 End stage renal disease: Secondary | ICD-10-CM | POA: Diagnosis not present

## 2020-09-02 DIAGNOSIS — N2581 Secondary hyperparathyroidism of renal origin: Secondary | ICD-10-CM | POA: Diagnosis not present

## 2020-09-02 DIAGNOSIS — D631 Anemia in chronic kidney disease: Secondary | ICD-10-CM | POA: Diagnosis not present

## 2020-09-02 NOTE — Progress Notes (Signed)
[name] [name], [name] [name] ([id number]) Visit Report for 09/01/2020 HPI Details Patient Name: Date of Service: [place], [name] 09/01/2020 2:00 PM Medical Record Number: [id number] Patient Account Number: [id number] Date of Birth/Sex: Treating RN: [birth year] (55 y.o. [name] [name] Primary Care Provider: Vincente [name] Other Clinician: Referring Provider: Treating Provider/Extender: [name] [name] in Treatment: 8 History of Present Illness HPI Description: 4/13 - [name] [name] is a 55 year old male with past medical history of insulin-dependent type 2 diabetes, end-stage renal disease on hemodialysis, Raynaud's disease with bilateral hand and fingertip ulcers and essential hypertension that presents with bilateral heel ulcers. This has been an ongoing issue for the patient and has been following closely with podiatry. He had debridement and graft application preformed on 06/04/2020 due to worsening of the wounds. Imaging suggested osteo and he was treated with vancomycin and amoxicillin. Today he presents for further evaluation of the wounds. He is currently using Santyl on these areas daily. He uses a cam boot on the left and a surgical shoe on the right to help with offloading. He denies any acute pain, fever/chills, increased warmth or erythema to the feet or purulent drainage. 5/9; unfortunately patient has not followed up to his regular appointments. Its been almost a month since I last saw him. He has been using Santyl daily to the wound beds. He states that he sits a lot in his chair with with his feet on the ground. He reports walking around a lot in the day doing his activities of daily living. He denies acute signs of infection. 5/23; patient presents for his 2-week follow-up. He has been using Santyl daily to the wounds and has been trying to offload his heels throughout the day. He states he has to sleep in a wheelchair and occasionally is able to elevate his  legs. He denies acute signs of infection. 6/1; patient presents for 1 week follow-up. He has been using Santyl to the wounds however only has a little left and refills are not until 6/14. He was discharged as well by home health for unknown reasons. He continues to sleep in a wheelchair and cannot offload his heels. He states he is receiving a hospital bed today. He denies acute signs of infection. 6/6; patient presents for 1 week follow-up. He had just enough Santyl to cover to yesterday but does not have a refill until 6/14. He did not receive the supplies from home health. Despite having a hospital bed he continues to sleep in a wheelchair and cannot offload his heels. He denies acute signs of infection. He also developed a small wound to the area between his posterior thigh and buttocks. He has been placing a Band-Aid on this. 6/13; this is a patient with severe bilateral plantar heel wounds. He has been using Santyl and Hydrofera Blue. Followed for a prolonged period of time by podiatry. He received operative debridement and bone biopsy in February. He does not describe himself is that frequent and ambulator. He has bilateral heel offloading shoes During the hospitalization in February he saw Dr. Donnetta Hutching of vein and vascular he noted the atypical location of these wounds and noted that the noninvasive arterial studies revealed noncompressible vessels bilaterally but the toe brachial indexes on the left were normal and slightly diminished on the right. MRI showed no abscesses The patient is a type 2 diabetes on dialysis. He is listed as having severe Raynaud's phenomenon and he has had amputation of fingers Electronic Signature(s) Signed: 09/01/2020 5:24:16 PM  By: [name] Ham MD Entered By: [name] [name] on 09/01/2020 16:17:06 -------------------------------------------------------------------------------- Physical Exam Details Patient Name: Date of Service: [name] Mocha, MA [name] 09/01/2020  2:00 PM Medical Record Number: [id number] Patient Account Number: [id number] Date of Birth/Sex: Treating RN: 30-Jul-1965 (55 y.o. [name] [name] Primary Care Provider: Vincente [name] Other Clinician: Referring Provider: Treating Provider/Extender: [name] [name] in Treatment: 8 Constitutional Sitting or standing Blood Pressure is within target range for patient.. Pulse regular and within target range for patient.[name] Respirations regular, non-labored and within target range.. Temperature is normal and within the target range for the patient.[name] Appears in no distress. Notes Wound exam; left and right calcaneus completely nonviable surface there is palpable bone on the left and precariously close on the right. The left as a odor. Necrotic tissue over the majority of the surfaces of both of these large wounds that encompassed almost the entirety of the plantar heels bilaterally. I could not feel pulses in either feet at the dorsalis pedis or posterior tibial. He has lymphedema bilaterally. I could feel right popliteal and right femoral pulses but not left femoral or left popliteal. He has delayed capillary refill time. Electronic Signature(s) Signed: 09/01/2020 5:24:16 PM By: [name] Ham MD Entered By: [name] [name] on 09/01/2020 16:18:58 -------------------------------------------------------------------------------- Physician Orders Details Patient Name: Date of Service: [name] Mocha, MA [name] 09/01/2020 2:00 PM Medical Record Number: [id number] Patient Account Number: [id number] Date of Birth/Sex: Treating RN: 02-21-1966 (56 y.o. 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 Bathing/ Shower/ Hygiene May shower and wash wound with soap and water. Edema Control - Lymphedema / SCD / Other Elevate legs to the level of the heart or above for 30 minutes daily and/or when sitting, a frequency of: - throughout the day Avoid standing for long periods of time. Exercise regularly Moisturize legs daily. Off-Loading Open toe surgical shoe to: - both feet Home Health Admit to Home Health for wound care. May utilize formulary equivalent dressing for wound treatment orders unless otherwise specified. Dressing changes to be completed by [facility] on Monday / Wednesday / Friday except when patient has scheduled visit at [facility]. Wound Treatment Wound #2 - Calcaneus Wound Laterality: Right Cleanser: Normal Saline (Generic) 1 x Per Day/15 Days Discharge Instructions: Cleanse the wound with Normal Saline prior to applying a clean dressing using gauze sponges, not tissue or cotton balls. Cleanser: Wound Cleanser (Generic) 1 x Per Day/15 Days Discharge Instructions: Cleanse the wound with wound cleanser or normal saline prior to applying a clean dressing using gauze sponges, not tissue or cotton balls. Prim Dressing: KerraCel Ag Gelling Fiber Dressing, 4x5 in (silver alginate) (DME) (Dispense As Written) 1 x Per Day/15 Days ary Discharge Instructions: Apply silver alginate to wound bed as instructed Secondary Dressing: Woven Gauze Sponge, Non-Sterile 4x4 in (Generic) 1 x Per Day/15 Days Discharge Instructions: Apply over primary dressing as directed. Secondary Dressing: ALLEVYN Heel 4 1/2in x 5 1/2in / 10.5cm x 13.5cm (Generic) 1 x Per Day/15 Days Discharge Instructions:  Apply  over primary dressing as directed. Secured With: [employer], 4.5x3.1 (in/yd) (Generic) 1 x Per Day/15 Days Discharge Instructions: Secure with Kerlix as directed. Secured With: Paper Tape, 2x10 (in/yd) (Generic) 1 x Per Day/15 Days Discharge Instructions: Secure dressing with tape as directed. Wound #3 - Calcaneus Wound Laterality: Left Cleanser: Normal Saline (Generic) 1 x Per Day/15 Days Discharge Instructions: Cleanse the wound with Normal Saline prior to applying a clean dressing using gauze sponges, not tissue or cotton balls. Cleanser: Wound Cleanser (Generic) 1 x Per Day/15 Days Discharge Instructions: Cleanse the wound with wound cleanser or normal saline prior to applying a clean dressing using gauze sponges, not tissue or cotton balls. Prim Dressing: KerraCel Ag Gelling Fiber Dressing, 4x5 in (silver alginate) (DME) (Dispense As Written) 1 x Per Day/15 Days ary Discharge Instructions: Apply silver alginate to wound bed as instructed Secondary Dressing: Woven Gauze Sponge, Non-Sterile 4x4 in (Generic) 1 x Per Day/15 Days Discharge Instructions: Apply over primary dressing as directed. Secondary Dressing: ALLEVYN Heel 4 1/2in x 5 1/2in / 10.5cm x 13.5cm (Generic) 1 x Per Day/15 Days Discharge Instructions: Apply over primary dressing as directed. Secured With: [employer], 4.5x3.1 (in/yd) (Generic) 1 x Per Day/15 Days Discharge Instructions: Secure with Kerlix as directed. Secured With: Paper Tape, 2x10 (in/yd) (Generic) 1 x Per Day/15 Days Discharge Instructions: Secure dressing with tape as directed. Wound #5 - Gluteal fold Wound Laterality: Left Cleanser: Soap and Water 1 x Per Day/30 Days Discharge Instructions: May shower and wash wound with dial antibacterial soap and water prior to dressing change. Topical: Antibiotic Ointment 1 x Per Day/30 Days Secondary Dressing: Bordered Gauze, 2x3.75 in 1 x Per Day/30 Days Discharge Instructions: Apply over  primary dressing as directed. Electronic Signature(s) Signed: 09/01/2020 5:24:16 PM By: [name] Ham MD Signed: 09/02/2020 6:10:01 PM By: Baruch Gouty RN, BSN Entered By: Baruch Gouty on 09/01/2020 15:25:13 -------------------------------------------------------------------------------- Problem List Details Patient Name: Date of Service: [name] Mocha, MA [name] 09/01/2020 2:00 PM Medical Record Number: [id number] Patient Account Number: [id number] Date of Birth/Sex: Treating RN: 1965/04/07 (55 y.o. 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 09/01/2020 2:00 PM Medical Record Number: [id number] Patient Account Number: [id number] Date of Birth/Sex: Treating RN: [birth year] (55 y.o. [name] [name] Primary Care Provider: Vincente [name] Other Clinician: Referring Provider: Treating Provider/Extender: [name] [name] in Treatment: 8 Subjective History of Present Illness (HPI) 4/13 - [name] [name] is a  55 year old male with past medical history of insulin-dependent type 2 diabetes, end-stage renal disease on hemodialysis, Raynaud's disease with bilateral hand and fingertip ulcers and essential hypertension that presents with bilateral heel ulcers. This has been an ongoing issue for the patient and has been following closely with podiatry. He had debridement and graft application preformed  on 06/04/2020 due to worsening of the wounds. Imaging suggested osteo and he was treated with vancomycin and amoxicillin. Today he presents for further evaluation of the wounds. He is currently using Santyl on these areas daily. He uses a cam boot on the left and a surgical shoe on the right to help with offloading. He denies any acute pain, fever/chills, increased warmth or erythema to the feet or purulent drainage. 5/9; unfortunately patient has not followed up to his regular appointments. Its been almost a month since I last saw him. He has been using Santyl daily to the wound beds. He states that he sits a lot in his chair with with his feet on the ground. He reports walking around a lot in the day doing his activities of daily living. He denies acute signs of infection. 5/23; patient presents for his 2-week follow-up. He has been using Santyl daily to the wounds and has been trying to offload his heels throughout the day. He states he has to sleep in a wheelchair and occasionally is able to elevate his legs. He denies acute signs of infection. 6/1; patient presents for 1 week follow-up. He has been using Santyl to the wounds however only has a little left and refills are not until 6/14. He was discharged as well by home health for unknown reasons. He continues to sleep in a wheelchair and cannot offload his heels. He states he is receiving a hospital bed today. He denies acute signs of infection. 6/6; patient presents for 1 week follow-up. He had just enough Santyl to cover to yesterday but does not have a  refill until 6/14. He did not receive the supplies from home health. Despite having a hospital bed he continues to sleep in a wheelchair and cannot offload his heels. He denies acute signs of infection. He also developed a small wound to the area between his posterior thigh and buttocks. He has been placing a Band-Aid on this. 6/13; this is a patient with severe bilateral plantar heel wounds. He has been using Santyl and Hydrofera Blue. Followed for a prolonged period of time by podiatry. He received operative debridement and bone biopsy in February. He does not describe himself is that frequent and ambulator. He has bilateral heel offloading shoes During the hospitalization in February he saw Dr. Donnetta Hutching of vein and vascular he noted the atypical location of these wounds and noted that the noninvasive arterial studies revealed noncompressible vessels bilaterally but the toe brachial indexes on the left were normal and slightly diminished on the right. MRI showed no abscesses The patient is a type 2 diabetes on dialysis. He is listed as having severe Raynaud's phenomenon and he has had amputation of fingers Objective Constitutional Sitting or standing Blood Pressure is within target range for patient.. Pulse regular and within target range for patient.[name] Respirations regular, non-labored and within target range.. Temperature is normal and within the target range for the patient.[name] Appears in no distress. Vitals Time Taken: 2:09 PM, Height: 65 in, Weight: 240 lbs, BMI: 39.9, Temperature: 98.8 F, Pulse: 123 bpm, Respiratory Rate: 18 breaths/min, Blood Pressure: 111/68 mmHg, Capillary Blood Glucose: 142 mg/dl. General Notes: Wound exam; left and right calcaneus completely nonviable surface there is palpable bone on the left and precariously close on the right. The left as a odor. Necrotic tissue over the majority of the surfaces of both of these large wounds that encompassed almost the entirety of the  plantar heels bilaterally. I could not feel pulses in  either feet at the dorsalis pedis or posterior tibial. He has lymphedema bilaterally. I could feel right popliteal and right femoral pulses but not left femoral or left popliteal. He has delayed capillary refill time. Integumentary (Hair, Skin) Wound #2 status is Open. Original cause of wound was Pressure Injury. The date acquired was: 04/22/2020. The wound has been in treatment 8 weeks. The wound is located on the Right Calcaneus. The wound measures 7.2cm length x 6.5cm width x 1cm depth; 36.757cm^2 area and 36.757cm^3 volume. There is Fat Layer (Subcutaneous Tissue) exposed. There is no tunneling or undermining noted. There is a large amount of purulent drainage noted. The wound margin is well defined and not attached to the wound base. There is small (1-33%) pink granulation within the wound bed. There is a large (67-100%) amount of necrotic tissue within the wound bed including Eschar and Adherent Slough. Wound #3 status is Open. Original cause of wound was Pressure Injury. The date acquired was: 04/22/2020. The wound has been in treatment 8 weeks. The wound is located on the Left Calcaneus. The wound measures 6.7cm length x 7cm width x 0.3cm depth; 36.835cm^2 area and 11.051cm^3 volume. There is Fat Layer (Subcutaneous Tissue) exposed. There is no tunneling or undermining noted. There is a large amount of serosanguineous drainage noted. The wound margin is well defined and not attached to the wound base. There is medium (34-66%) pink granulation within the wound bed. There is a medium (34-66%) amount of necrotic tissue within the wound bed including Eschar and Adherent Slough. Wound #5 status is Open. Original cause of wound was Shear/Friction. The date acquired was: 08/22/2020. The wound has been in treatment 1 weeks. The wound is located on the Left Gluteal fold. The wound measures 0.3cm length x 0.5cm width x 0.1cm depth; 0.118cm^2 area and  0.012cm^3 volume. There is Fat Layer (Subcutaneous Tissue) exposed. There is no tunneling or undermining noted. There is a medium amount of serosanguineous drainage noted. The wound margin is distinct with the outline attached to the wound base. There is large (67-100%) red, pink granulation within the wound bed. There is no necrotic tissue within the wound bed. 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 Bathing/ Shower/ Hygiene: May shower and wash wound with soap and water. Edema Control - Lymphedema / SCD / Other: Elevate legs to the level of the heart or above for 30 minutes daily and/or when sitting, a frequency of: - throughout the day Avoid standing for long periods of time. Exercise regularly Moisturize legs daily. Off-Loading: Open toe surgical shoe to: - both feet Home Health: Admit to [facility] for wound care. May utilize formulary equivalent dressing for wound treatment orders unless otherwise specified. Dressing changes to be completed by [facility] on Monday / Wednesday / Friday except when patient has scheduled visit at [facility]. WOUND #2: - Calcaneus Wound Laterality: Right Cleanser: Normal Saline (Generic) 1 x Per Day/15 Days Discharge Instructions: Cleanse the wound with Normal Saline prior to applying a clean dressing using gauze sponges, not tissue or cotton balls. Cleanser: Wound Cleanser (Generic) 1 x Per Day/15 Days Discharge Instructions: Cleanse the wound with wound cleanser or normal saline prior to applying a clean dressing using gauze sponges,  not tissue or cotton balls. Prim Dressing: KerraCel Ag Gelling Fiber Dressing, 4x5 in  (silver alginate) (DME) (Dispense As Written) 1 x Per Day/15 Days ary Discharge Instructions: Apply silver alginate to wound bed as instructed Secondary Dressing: Woven Gauze Sponge, Non-Sterile 4x4 in (Generic) 1 x Per Day/15 Days Discharge Instructions: Apply over primary dressing as directed. Secondary Dressing: ALLEVYN Heel 4 1/2in x 5 1/2in / 10.5cm x 13.5cm (Generic) 1 x Per Day/15 Days Discharge Instructions: Apply over primary dressing as directed. Secured With: [employer], 4.5x3.1 (in/yd) (Generic) 1 x Per Day/15 Days Discharge Instructions: Secure with Kerlix as directed. Secured With: Paper T ape, 2x10 (in/yd) (Generic) 1 x Per Day/15 Days Discharge Instructions: Secure dressing with tape as directed. WOUND #3: - Calcaneus Wound Laterality: Left Cleanser: Normal Saline (Generic) 1 x Per Day/15 Days Discharge Instructions: Cleanse the wound with Normal Saline prior to applying a clean dressing using gauze sponges, not tissue or cotton balls. Cleanser: Wound Cleanser (Generic) 1 x Per Day/15 Days Discharge Instructions: Cleanse the wound with wound cleanser or normal saline prior to applying a clean dressing using gauze sponges, not tissue or cotton balls. Prim Dressing: KerraCel Ag Gelling Fiber Dressing, 4x5 in (silver alginate) (DME) (Dispense As Written) 1 x Per Day/15 Days ary Discharge Instructions: Apply silver alginate to wound bed as instructed Secondary Dressing: Woven Gauze Sponge, Non-Sterile 4x4 in (Generic) 1 x Per Day/15 Days Discharge Instructions: Apply over primary dressing as directed. Secondary Dressing: ALLEVYN Heel 4 1/2in x 5 1/2in / 10.5cm x 13.5cm (Generic) 1 x Per Day/15 Days Discharge Instructions: Apply over primary dressing as directed. Secured With: [employer], 4.5x3.1 (in/yd) (Generic) 1 x Per Day/15 Days Discharge Instructions: Secure with Kerlix as directed. Secured With: Paper T ape, 2x10 (in/yd) (Generic) 1 x Per Day/15  Days Discharge Instructions: Secure dressing with tape as directed. WOUND #5: - Gluteal fold Wound Laterality: Left Cleanser: Soap and Water 1 x Per Day/30 Days Discharge Instructions: May shower and wash wound with dial antibacterial soap and water prior to dressing change. Topical: Antibiotic Ointment 1 x Per Day/30 Days Secondary Dressing: Bordered Gauze, 2x3.75 in 1 x Per Day/30 Days Discharge Instructions: Apply over primary dressing as directed. 1. I change the primary dressing here to silver alginate. Both wounds are completely necrotic and draining. 2. He has bilateral lymphedema which makes feeling pulses difficult nevertheless I could not feel anything on the left leg. Proximally at the femoral and popliteal I can feel on the right but again nothing in the dorsal foot or ankle. Dr. Marrion Coy in February showed his feet to be warm and well-perfused with palpable pulses on the right foot but not the left. As noted I cannot identify pulses on either side currently. Hand-held Dopplers at that time showed multiphasic flow in the pedal pulses bilaterally. He was felt to have adequate blood flow for healing. I think he may need to repeat vascular review. 3. Although he had a negative work-up for osteomyelitis in February and this may need to be repeated. This would include bone biopsies and MRIs 4. His wounds are really not making any progress in fact they appear to be deteriorating completely necrotic surfaces 5. These are not usual sites for pressure ulcers although he spends a lot of time in his wheelchair perhaps spends more time with his feet on foot rests. Nevertheless the extent of these wounds is concerning Electronic Signature(s) Signed: 09/01/2020 5:24:16 PM By: [name] Ham MD Entered By: [name] [name]  on 09/01/2020 16:24:22 -------------------------------------------------------------------------------- SuperBill Details Patient Name: Date of Service: [name] [name], [place] L.  09/01/2020 Medical Record Number: [id number] Patient Account Number: [id number] Date of Birth/Sex: Treating RN: [birth year] (55 y.o. 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

## 2020-09-03 NOTE — Progress Notes (Signed)
[name] [name], [name] [name] ([id number]) Visit Report for 09/01/2020 Arrival Information Details Patient Name: Date of Service: [place], [name] 09/01/2020 2:00 PM Medical Record Number: [id number] Patient Account Number: [id number] Date of Birth/Sex: Treating RN: [birth year] (55 y.o. [name] [name], [name] Primary Care Armin Yerger: [name] Other Clinician: Referring Aveleen Nevers: Treating Alany Borman/Extender: Tessie Eke in Treatment: 8 Visit Information History Since Last Visit Added or deleted any medications: No Patient Arrived: Wheel Chair Any new allergies or adverse reactions: No Arrival Time: 14:09 Had a fall or experienced change in No Accompanied By: mom activities of daily living that may affect Transfer Assistance: None risk of falls: Patient Identification Verified: Yes Signs or symptoms of abuse/neglect since last visito No Secondary Verification Process Completed: Yes Hospitalized since last visit: No Patient Has Alerts: Yes Implantable device outside of the clinic excluding No Patient Alerts: R TBI: 0.56 cellular tissue based products placed in the center L TBI: 0.75 since last visit: Has Dressing in Place as Prescribed: Yes Pain Present Now: No Electronic Signature(s) Signed: 09/03/2020 6:22:58 PM By: Rhae Hammock RN Entered By: Rhae Hammock on 09/01/2020 14:09:29 -------------------------------------------------------------------------------- Clinic Level of Care Assessment Details Patient Name: Date of Service: [name] [name], [place] Eros L. 09/01/2020 2:00 PM Medical Record Number: [id number] Patient Account Number: [id number] Date of Birth/Sex: Treating RN: 08/20/1965 (55 y.o. [name] Primary Care Kimora Stankovic: [name] Other Clinician: Referring Andron Marrazzo: Treating Mckena Chern/Extender: Tessie Eke in Treatment: 8 Clinic Level of Care Assessment Items TOOL 4 Quantity Score _0  - 0 Use when  only an EandM is performed on FOLLOW-UP visit ASSESSMENTS - Nursing Assessment / Reassessment X- 1 10 Reassessment of Co-morbidities (includes updates in patient status) X- 1 5 Reassessment of Adherence to Treatment Plan ASSESSMENTS - Wound and Skin A ssessment / Reassessment _1  - 0 Simple Wound Assessment / Reassessment - one wound X- 3 5 Complex Wound Assessment / Reassessment - multiple wounds _2  - 0 Dermatologic / Skin Assessment (not related to wound area) ASSESSMENTS - Focused Assessment X- 1 5 Circumferential Edema Measurements - multi extremities _3  - 0 Nutritional Assessment / Counseling / Intervention X- 1 5 Lower Extremity Assessment (monofilament, tuning fork, pulses) _4  - 0 Peripheral Arterial Disease Assessment (using hand held doppler) ASSESSMENTS - Ostomy and/or Continence Assessment and Care _5  - 0 Incontinence Assessment and Management _6  - 0 Ostomy Care Assessment and Management (repouching, etc.) PROCESS - Coordination of Care X - Simple Patient / Family Education for ongoing care 1 15 _7  - 0 Complex (extensive) Patient / Family Education for ongoing care X- 1 10 Staff obtains Programmer, systems, Records, T Results / Process Orders est X- 1 10 Staff telephones HHA, Nursing Homes / Clarify orders / etc _8  - 0 Routine Transfer to another Facility (non-emergent condition) _9  - 0 Routine Hospital Admission (non-emergent condition) _10  - 0 New Admissions / Biomedical engineer / Ordering NPWT Apligraf, etc. , _11  - 0 Emergency Hospital Admission (emergent condition) X- 1 10 Simple Discharge Coordination _12  - 0 Complex (extensive) Discharge Coordination PROCESS - Special Needs _13  - 0 Pediatric / Minor Patient Management _14  - 0 Isolation Patient Management _15  - 0 Hearing / Language / Visual special needs _16  - 0 Assessment of Community assistance (transportation, D/C planning, etc.) _17  - 0 Additional assistance / Altered mentation _18  - 0 Support  Surface(s) Assessment (bed, cushion, seat, etc.) INTERVENTIONS - Wound Cleansing / Measurement _19  - 0 Simple Wound Cleansing - one wound X- 3 5 Complex Wound Cleansing -  multiple wounds X- 1 5 Wound Imaging (photographs - any number of wounds) _0  - 0 Wound Tracing (instead of photographs) _1  - 0 Simple Wound Measurement - one wound X- 3 5 Complex Wound Measurement - multiple wounds INTERVENTIONS - Wound Dressings X - Small Wound Dressing one or multiple wounds 3 10 _2  - 0 Medium Wound Dressing one or multiple wounds _3  - 0 Large Wound Dressing one or multiple wounds X- 1 5 Application of Medications - topical <[id number]>_1<\/[id number]>_4  - 0 Application of Medications - injection INTERVENTIONS - Miscellaneous _5  - 0 External ear exam _6  - 0 Specimen Collection (cultures, biopsies, blood, body fluids, etc.) _7  - 0 Specimen(s) / Culture(s) sent or taken to Lab for analysis _8  - 0 Patient Transfer (multiple staff / Civil Service fast streamer / Similar devices) _9  - 0 Simple Staple / Suture removal (25 or less) _10  - 0 Complex Staple / Suture removal (26 or more) _11  - 0 Hypo / Hyperglycemic Management (close monitor of Blood Glucose) _12  - 0 Ankle / Brachial Index (ABI) - do not check if billed separately X- 1 5 Vital Signs Has the patient been seen at the hospital within the last three years: Yes Total Score: 160 Level Of Care: New/Established - Level 5 Electronic Signature(s) Signed: 09/02/2020 6:10:01 PM By: Baruch Gouty RN, BSN Entered By: Baruch Gouty on 09/01/2020 16:20:14 -------------------------------------------------------------------------------- Encounter Discharge Information Details Patient Name: Date of Service: [name] Mocha, MA [name] 09/01/2020 2:00 PM Medical Record Number: [id number] Patient Account Number: [id number] Date of Birth/Sex: Treating RN: 08-09-1965 (55 y.o. [name] [name], [name] Primary Care Kurtiss Wence: [name] Other Clinician: Referring Karma Ansley: Treating  Hagar Sadiq/Extender: Tessie Eke in Treatment: 8 Encounter Discharge Information Items Discharge Condition: Stable Ambulatory Status: Wheelchair Discharge Destination: Home Transportation: Private Auto Accompanied By: mom Schedule Follow-up Appointment: Yes Clinical Summary of Care: Patient Declined Electronic Signature(s) Signed: 09/03/2020 6:22:58 PM By: Rhae Hammock RN Entered By: Rhae Hammock on 09/01/2020 17:01:18 -------------------------------------------------------------------------------- Lower Extremity Assessment Details Patient Name: Date of Service: [name] Mocha, MA [name] 09/01/2020 2:00 PM Medical Record Number: [id number] Patient Account Number: [id number] Date of Birth/Sex: Treating RN: Nov 29, 1965 (55 y.o. [name] [name], [name] Primary Care Cordella Nyquist: [name] Other Clinician: Referring Vivek Grealish: Treating Philip Eckersley/Extender: Tessie Eke in Treatment: 8 Edema Assessment Assessed: Shirlyn Goltz: Yes] Patrice Paradise: Yes] Edema: [Left: Yes] [Right: Yes] Calf Left: Right: Point of Measurement: 34 cm From Medial Instep 50 cm 49 cm Ankle Left: Right: Point of Measurement: 12 cm From Medial Instep 25 cm 26.5 cm Vascular Assessment Pulses: Dorsalis Pedis Palpable: [Left:Yes] [Right:Yes] Posterior Tibial Palpable: [Left:Yes] [Right:Yes] Electronic Signature(s) Signed: 09/03/2020 6:22:58 PM By: Rhae Hammock RN Entered By: Rhae Hammock on 09/01/2020 14:21:50 -------------------------------------------------------------------------------- Multi Wound Chart Details Patient Name: Date of Service: [name] Mocha, MA [name] 09/01/2020 2:00 PM Medical Record Number: [id number] Patient Account Number: [id number] Date of Birth/Sex: Treating RN: 09-Apr-1965 (55 y.o. [name] Primary Care Kayhan Boardley: [name] Other Clinician: Referring Remberto Lienhard: Treating Claiborne Stroble/Extender: Tessie Eke in Treatment: 8 Vital Signs Height(in): 65 Capillary Blood Glucose(mg/dl): 142 Weight(lbs): 240 Pulse(bpm): 123 Body Mass Index(BMI): 40 Blood Pressure(mmHg): 111/68 Temperature(F): 98.8 Respiratory Rate(breaths/min): 18 Photos: [2:No Photos Right Calcaneus] [3:No Photos Left Calcaneus] [5:No Photos Left Gluteal fold] Wound Location: [2:Pressure Injury] [3:Pressure Injury] [5:Shear/Friction] Wounding Event: [2:Diabetic Wound/Ulcer of the Lower] [3:Diabetic Wound/Ulcer of the Lower] [5:Pressure Ulcer] Primary Etiology: [2:Extremity Anemia, Congestive Heart Failure,] [3:Extremity Anemia, Congestive Heart Failure,] [5:Anemia, Congestive Heart Failure,] Comorbid History: [2:Hypertension, Type II  Diabetes, End Hypertension, Type II Diabetes, End Hypertension, Type II Diabetes, End Stage Renal Disease, Raynauds, Stage Renal Disease, Raynauds, Stage Renal Disease, Raynauds, Neuropathy 04/22/2020]  [3:Neuropathy 04/22/2020] [5:Neuropathy 08/22/2020] Date Acquired: [2:8] [3:8] [5:1] Weeks of Treatment: [2:Open] [3:Open] [5:Open] Wound Status: [2:7.2x6.5x1] [3:6.7x7x0.3] [5:0.3x0.5x0.1] Measurements L x W x D (cm) [2:36.757] [3:36.835] [5:0.118] A (cm) : rea [2:36.757] [3:11.051] [5:0.012] Volume (cm) : [2:-246.70%] [3:-93.80%] [5:96.20%] % Reduction in A rea: [2:-1633.00%] [3:35.40%] [5:96.20%] % Reduction in Volume: [2:Grade 2] [3:Grade 2] [5:Category/Stage II] Classification: [2:Large] [3:Large] [5:Medium] Exudate A mount: [2:Purulent] [3:Serosanguineous] [5:Serosanguineous] Exudate Type: [2:yellow, brown, green] [3:red, brown] [5:red, brown] Exudate Color: [2:Well defined, not attached] [3:Well defined, not attached] [5:Distinct, outline attached] Wound Margin: [2:Small (1-33%)] [3:Medium (34-66%)] [5:Large (67-100%)] Granulation A mount: [2:Pink] [3:Pink] [5:Red, Pink] Granulation Quality: [2:Large (67-100%)] [3:Medium (34-66%)] [5:None Present  (0%)] Necrotic A mount: [2:Eschar, Adherent Slough] [3:Eschar, Adherent Slough] [5:N/A] Necrotic Tissue: [2:Fat Layer (Subcutaneous Tissue): Yes Fat Layer (Subcutaneous Tissue): Yes Fat Layer (Subcutaneous Tissue): Yes] Exposed Structures: [2:Fascia: No Tendon: No Muscle: No Joint: No Bone: No Small (1-33%)] [3:Fascia: No Tendon: No Muscle: No Joint: No Bone: No Small (1-33%)] [5:Fascia: No Tendon: No Muscle: No Joint: No Bone: No None] Treatment Notes Electronic Signature(s) Signed: 09/01/2020 5:24:16 PM By: Linton Ham MD Signed: 09/02/2020 6:10:01 PM By: Baruch Gouty RN, BSN Entered By: Linton Ham on 09/01/2020 16:09:53 -------------------------------------------------------------------------------- Multi-Disciplinary Care Plan Details Patient Name: Date of Service: [name] Mocha, MA [name] 09/01/2020 2:00 PM Medical Record Number: [id number] Patient Account Number: [id number] Date of Birth/Sex: Treating RN: [birth year] (55 y.o. [name] Primary Care Livie Vanderhoof: [name] Other Clinician: Referring Yuriko Portales: Treating Bengie Kaucher/Extender: Tessie Eke in Treatment: 8 Multidisciplinary Care Plan reviewed with physician Active Inactive Abuse / Safety / Falls / Self Care Management Nursing Diagnoses: Potential for falls Potential for injury related to falls Goals: Patient will not experience any injury related to falls Date Initiated: 07/01/2020 Target Resolution Date: 09/05/2020 Goal Status: Active Patient/caregiver will verbalize/demonstrate measures taken to prevent injury and/or falls Date Initiated: 07/01/2020 Date Inactivated: 08/11/2020 Target Resolution Date: 08/08/2020 Goal Status: Met Interventions: Assess Activities of Daily Living upon admission and as needed Assess fall risk on admission and as needed Assess: immobility, friction, shearing, incontinence upon admission and as needed Assess impairment of mobility on  admission and as needed per policy Assess personal safety and home safety (as indicated) on admission and as needed Provide education on fall prevention Provide education on personal and home safety Notes: Nutrition Nursing Diagnoses: Impaired glucose control: actual or potential Potential for alteratiion in Nutrition/Potential for imbalanced nutrition Goals: Patient/caregiver agrees to and verbalizes understanding of need to use nutritional supplements and/or vitamins as prescribed Date Initiated: 07/01/2020 Date Inactivated: 08/11/2020 Target Resolution Date: 08/08/2020 Goal Status: Met Patient/caregiver will maintain therapeutic glucose control Date Initiated: 07/01/2020 Target Resolution Date: 09/05/2020 Goal Status: Active Interventions: Assess HgA1c results as ordered upon admission and as needed Assess patient nutrition upon admission and as needed per policy Provide education on elevated blood sugars and impact on wound healing Provide education on nutrition Treatment Activities: Education provided on Nutrition : 07/01/2020 Notes: Wound/Skin Impairment Nursing Diagnoses: Impaired tissue integrity Knowledge deficit related to ulceration/compromised skin integrity Goals: Patient/caregiver will verbalize understanding of skin care regimen Date Initiated: 07/01/2020 Target Resolution Date: 09/05/2020 Goal Status: Active Ulcer/skin breakdown will have a volume reduction of 30% by week 4 Date Initiated: 07/01/2020 Date Inactivated: 08/11/2020 Target Resolution Date: 08/08/2020 Unmet Reason: eschar requiring Goal Status:  Unmet multiple debridements Ulcer/skin breakdown will have a volume reduction of 50% by week 8 Date Initiated: 08/11/2020 Target Resolution Date: 09/05/2020 Goal Status: Active Interventions: Assess patient/caregiver ability to obtain necessary supplies Assess patient/caregiver ability to perform ulcer/skin care regimen upon admission and as needed Assess  ulceration(s) every visit Provide education on ulcer and skin care Notes: Electronic Signature(s) Signed: 09/02/2020 6:10:01 PM By: Baruch Gouty RN, BSN Entered By: Baruch Gouty on 09/01/2020 15:02:25 -------------------------------------------------------------------------------- Pain Assessment Details Patient Name: Date of Service: [name] Mocha, MA [place] L. 09/01/2020 2:00 PM Medical Record Number: [id number] Patient Account Number: [id number] Date of Birth/Sex: Treating RN: [birth year] (55 y.o. Erie Noe Primary Care Kaylib Furness: [name] Other Clinician: Referring Lathyn Griggs: Treating Charlesia Canaday/Extender: Tessie Eke in Treatment: 8 Active Problems Location of Pain Severity and Description of Pain Patient Has Paino Yes Site Locations Pain Location: Pain in Ulcers With Dressing Change: Yes Duration of the Pain. Constant / Intermittento Intermittent Rate the pain. Current Pain Level: 8 Worst Pain Level: 10 Least Pain Level: 0 Tolerable Pain Level: 8 Character of Pain Describe the Pain: Aching Pain Management and Medication Current Pain Management: Medication: Yes Cold Application: No Rest: Yes Massage: No Activity: No T.E.N.S.: No Heat Application: No Leg drop or elevation: No Is the Current Pain Management Adequate: Adequate How does your wound impact your activities of daily livingo Sleep: No Bathing: No Appetite: No Relationship With Others: No Bladder Continence: No Emotions: No Bowel Continence: No Work: No Toileting: No Drive: No Dressing: No Hobbies: No Electronic Signature(s) Signed: 09/03/2020 6:22:58 PM By: Rhae Hammock RN Entered By: Rhae Hammock on 09/01/2020 14:18:30 -------------------------------------------------------------------------------- Patient/Caregiver Education Details Patient Name: Date of Service: [name] Mocha, [place] [name] 6/13/2022andnbsp2:00 PM Medical Record Number:  [id number] Patient Account Number: [id number] Date of Birth/Gender: Treating RN: December 15, 1965 (55 y.o. [name] Primary Care Physician: [name] Other Clinician: Referring Physician: Treating Physician/Extender: Tessie Eke in Treatment: 8 Education Assessment Education Provided To: Patient Education Topics Provided Pressure: Methods: Explain/Verbal Responses: Reinforcements needed, State content correctly Wound/Skin Impairment: Methods: Explain/Verbal Responses: Reinforcements needed, State content correctly Electronic Signature(s) Signed: 09/02/2020 6:10:01 PM By: Baruch Gouty RN, BSN Entered By: Baruch Gouty on 09/01/2020 15:02:50 -------------------------------------------------------------------------------- Wound Assessment Details Patient Name: Date of Service: [name] Mocha, MA [place] L. 09/01/2020 2:00 PM Medical Record Number: [id number] Patient Account Number: [id number] Date of Birth/Sex: Treating RN: [birth year] (55 y.o. [name] [name], [name] Primary Care Risha Barretta: [name] Other Clinician: Referring Kristl Morioka: Treating Cadan Maggart/Extender: Tessie Eke in Treatment: 8 Wound Status Wound Number: 2 Primary Diabetic Wound/Ulcer of the Lower Extremity Etiology: Wound Location: Right Calcaneus Wound Open Wounding Event: Pressure Injury Status: Date Acquired: 04/22/2020 Comorbid Anemia, Congestive Heart Failure, Hypertension, Type II Diabetes, Weeks Of Treatment: 8 History: End Stage Renal Disease, Raynauds, Neuropathy Clustered Wound: No Photos Wound Measurements Length: (cm) 7.2 Width: (cm) 6.5 Depth: (cm) 1 Area: (cm) 36.757 Volume: (cm) 36.757 % Reduction in Area: -246.7% % Reduction in Volume: -1633% Epithelialization: Small (1-33%) Tunneling: No Undermining: No Wound Description Classification: Grade 2 Wound Margin: Well defined, not attached Exudate Amount:  Large Exudate Type: Purulent Exudate Color: yellow, brown, green Foul Odor After Cleansing: No Slough/Fibrino Yes Wound Bed Granulation Amount: Small (1-33%) Exposed Structure Granulation Quality: Pink Fascia Exposed: No Necrotic Amount: Large (67-100%) Fat Layer (Subcutaneous Tissue) Exposed: Yes Necrotic Quality: Eschar, Adherent Slough Tendon Exposed: No Muscle Exposed: No Joint Exposed: No Bone Exposed: No Treatment Notes Wound #2 (Calcaneus) Wound Laterality:  Right Cleanser Wound Cleanser Discharge Instruction: Cleanse the wound with wound cleanser or normal saline prior to applying a clean dressing using gauze sponges, not tissue or cotton balls. Normal Saline Discharge Instruction: Cleanse the wound with Normal Saline prior to applying a clean dressing using gauze sponges, not tissue or cotton balls. Peri-Wound Care Topical Primary Dressing KerraCel Ag Gelling Fiber Dressing, 4x5 in (silver alginate) Discharge Instruction: Apply silver alginate to wound bed as instructed Secondary Dressing Woven Gauze Sponge, Non-Sterile 4x4 in Discharge Instruction: Apply over primary dressing as directed. ALLEVYN Heel 4 1/2in x 5 1/2in / 10.5cm x 13.5cm Discharge Instruction: Apply over primary dressing as directed. Secured With [employer], 4.5x3.1 (in/yd) Discharge Instruction: Secure with Kerlix as directed. Paper Tape, 2x10 (in/yd) Discharge Instruction: Secure dressing with tape as directed. Compression Wrap Compression Stockings Add-Ons Electronic Signature(s) Signed: 09/03/2020 1:43:18 PM By: Sandre Kitty Signed: 09/03/2020 6:22:58 PM By: Rhae Hammock RN Entered By: Sandre Kitty on 09/02/2020 12:00:57 -------------------------------------------------------------------------------- Wound Assessment Details Patient Name: Date of Service: [name] Mocha, MA [name] 09/01/2020 2:00 PM Medical Record Number: [id number] Patient Account Number: [id number] Date of  Birth/Sex: Treating RN: 02/04/1966 (55 y.o. [name] [name], [name] Primary Care Willford Rabideau: [name] Other Clinician: Referring Zareena Willis: Treating Aldrich Lloyd/Extender: Tessie Eke in Treatment: 8 Wound Status Wound Number: 3 Primary Diabetic Wound/Ulcer of the Lower Extremity Etiology: Wound Location: Left Calcaneus Wound Open Wounding Event: Pressure Injury Status: Date Acquired: 04/22/2020 Comorbid Anemia, Congestive Heart Failure, Hypertension, Type II Diabetes, Weeks Of Treatment: 8 History: End Stage Renal Disease, Raynauds, Neuropathy Clustered Wound: No Photos Wound Measurements Length: (cm) 6.7 Width: (cm) 7 Depth: (cm) 0.3 Area: (cm) 36.835 Volume: (cm) 11.051 % Reduction in Area: -93.8% % Reduction in Volume: 35.4% Epithelialization: Small (1-33%) Tunneling: No Undermining: No Wound Description Classification: Grade 2 Wound Margin: Well defined, not attached Exudate Amount: Large Exudate Type: Serosanguineous Exudate Color: red, brown Foul Odor After Cleansing: No Slough/Fibrino Yes Wound Bed Granulation Amount: Medium (34-66%) Exposed Structure Granulation Quality: Pink Fascia Exposed: No Necrotic Amount: Medium (34-66%) Fat Layer (Subcutaneous Tissue) Exposed: Yes Necrotic Quality: Eschar, Adherent Slough Tendon Exposed: No Muscle Exposed: No Joint Exposed: No Bone Exposed: No Treatment Notes Wound #3 (Calcaneus) Wound Laterality: Left Cleanser Wound Cleanser Discharge Instruction: Cleanse the wound with wound cleanser or normal saline prior to applying a clean dressing using gauze sponges, not tissue or cotton balls. Normal Saline Discharge Instruction: Cleanse the wound with Normal Saline prior to applying a clean dressing using gauze sponges, not tissue or cotton balls. Peri-Wound Care Topical Primary Dressing KerraCel Ag Gelling Fiber Dressing, 4x5 in (silver alginate) Discharge Instruction: Apply silver  alginate to wound bed as instructed Secondary Dressing Woven Gauze Sponge, Non-Sterile 4x4 in Discharge Instruction: Apply over primary dressing as directed. ALLEVYN Heel 4 1/2in x 5 1/2in / 10.5cm x 13.5cm Discharge Instruction: Apply over primary dressing as directed. Secured With [employer], 4.5x3.1 (in/yd) Discharge Instruction: Secure with Kerlix as directed. Paper Tape, 2x10 (in/yd) Discharge Instruction: Secure dressing with tape as directed. Compression Wrap Compression Stockings Add-Ons Electronic Signature(s) Signed: 09/03/2020 1:43:18 PM By: Sandre Kitty Signed: 09/03/2020 6:22:58 PM By: Rhae Hammock RN Entered By: Sandre Kitty on 09/02/2020 12:00:37 -------------------------------------------------------------------------------- Wound Assessment Details Patient Name: Date of Service: [name] Mocha, MA [name] 09/01/2020 2:00 PM Medical Record Number: [id number] Patient Account Number: [id number] Date of Birth/Sex: Treating RN: [birth year] (54 y.o. [name] [name], [name] Primary Care Dayanira Giovannetti: [name] Other Clinician: Referring Monti Jilek: Treating Ameriah Lint/Extender: Dellia Nims  Lestine Box, Iona Beard Weeks in Treatment: 8 Wound Status Wound Number: 5 Primary Pressure Ulcer Etiology: Wound Location: Left Gluteal fold Wound Open Wounding Event: Shear/Friction Status: Date Acquired: 08/22/2020 Comorbid Anemia, Congestive Heart Failure, Hypertension, Type II Diabetes, Weeks Of Treatment: 1 History: End Stage Renal Disease, Raynauds, Neuropathy Clustered Wound: No Photos Wound Measurements Length: (cm) 0.3 Width: (cm) 0.5 Depth: (cm) 0.1 Area: (cm) 0.118 Volume: (cm) 0.012 % Reduction in Area: 96.2% % Reduction in Volume: 96.2% Epithelialization: None Tunneling: No Undermining: No Wound Description Classification: Category/Stage II Wound Margin: Distinct, outline attached Exudate Amount: Medium Exudate Type: Serosanguineous Exudate  Color: red, brown Foul Odor After Cleansing: No Slough/Fibrino No Wound Bed Granulation Amount: Large (67-100%) Exposed Structure Granulation Quality: Red, Pink Fascia Exposed: No Necrotic Amount: None Present (0%) Fat Layer (Subcutaneous Tissue) Exposed: Yes Tendon Exposed: No Muscle Exposed: No Joint Exposed: No Bone Exposed: No Treatment Notes Wound #5 (Gluteal fold) Wound Laterality: Left Cleanser Soap and Water Discharge Instruction: May shower and wash wound with dial antibacterial soap and water prior to dressing change. Peri-Wound Care Topical Antibiotic Ointment Primary Dressing Secondary Dressing Bordered Gauze, 2x3.75 in Discharge Instruction: Apply over primary dressing as directed. Secured With Compression Wrap Compression Stockings Environmental education officer) Signed: 09/03/2020 1:43:18 PM By: Sandre Kitty Signed: 09/03/2020 6:22:58 PM By: Rhae Hammock RN Entered By: Sandre Kitty on 09/02/2020 12:01:32 -------------------------------------------------------------------------------- Vitals Details Patient Name: Date of Service: [name] Mocha, MA [name] 09/01/2020 2:00 PM Medical Record Number: [id number] Patient Account Number: [id number] Date of Birth/Sex: Treating RN: 1966/01/12 (55 y.o. [name] [name], [name] Primary Care Zarya Lasseigne: [name] Other Clinician: Referring Jaylee Freeze: Treating Kare Dado/Extender: Tessie Eke in Treatment: 8 Vital Signs Time Taken: 14:09 Temperature (F): 98.8 Height (in): 65 Pulse (bpm): 123 Weight (lbs): 240 Respiratory Rate (breaths/min): 18 Body Mass Index (BMI): 39.9 Blood Pressure (mmHg): 111/68 Capillary Blood Glucose (mg/dl): 142 Reference Range: 80 - 120 mg / dl Electronic Signature(s) Signed: 09/03/2020 6:22:58 PM By: Rhae Hammock RN Entered By: Rhae Hammock on 09/01/2020 14:18:05

## 2020-09-04 DIAGNOSIS — N186 End stage renal disease: Secondary | ICD-10-CM | POA: Diagnosis not present

## 2020-09-04 DIAGNOSIS — N2581 Secondary hyperparathyroidism of renal origin: Secondary | ICD-10-CM | POA: Diagnosis not present

## 2020-09-04 DIAGNOSIS — D631 Anemia in chronic kidney disease: Secondary | ICD-10-CM | POA: Diagnosis not present

## 2020-09-05 DIAGNOSIS — I7300 Raynaud's syndrome without gangrene: Secondary | ICD-10-CM | POA: Diagnosis not present

## 2020-09-05 DIAGNOSIS — Z794 Long term (current) use of insulin: Secondary | ICD-10-CM | POA: Diagnosis not present

## 2020-09-05 DIAGNOSIS — I120 Hypertensive chronic kidney disease with stage 5 chronic kidney disease or end stage renal disease: Secondary | ICD-10-CM | POA: Diagnosis not present

## 2020-09-05 DIAGNOSIS — Z992 Dependence on renal dialysis: Secondary | ICD-10-CM | POA: Diagnosis not present

## 2020-09-05 DIAGNOSIS — Z9181 History of falling: Secondary | ICD-10-CM | POA: Diagnosis not present

## 2020-09-05 DIAGNOSIS — Z741 Need for assistance with personal care: Secondary | ICD-10-CM | POA: Diagnosis not present

## 2020-09-05 DIAGNOSIS — E1122 Type 2 diabetes mellitus with diabetic chronic kidney disease: Secondary | ICD-10-CM | POA: Diagnosis not present

## 2020-09-05 DIAGNOSIS — S00411D Abrasion of right ear, subsequent encounter: Secondary | ICD-10-CM | POA: Diagnosis not present

## 2020-09-05 DIAGNOSIS — N186 End stage renal disease: Secondary | ICD-10-CM | POA: Diagnosis not present

## 2020-09-05 DIAGNOSIS — L89616 Pressure-induced deep tissue damage of right heel: Secondary | ICD-10-CM | POA: Diagnosis not present

## 2020-09-05 DIAGNOSIS — L89620 Pressure ulcer of left heel, unstageable: Secondary | ICD-10-CM | POA: Diagnosis not present

## 2020-09-06 DIAGNOSIS — D631 Anemia in chronic kidney disease: Secondary | ICD-10-CM | POA: Diagnosis not present

## 2020-09-06 DIAGNOSIS — N186 End stage renal disease: Secondary | ICD-10-CM | POA: Diagnosis not present

## 2020-09-06 DIAGNOSIS — N2581 Secondary hyperparathyroidism of renal origin: Secondary | ICD-10-CM | POA: Diagnosis not present

## 2020-09-08 ENCOUNTER — Other Ambulatory Visit: Payer: Self-pay | Admitting: Internal Medicine

## 2020-09-08 ENCOUNTER — Encounter (HOSPITAL_BASED_OUTPATIENT_CLINIC_OR_DEPARTMENT_OTHER): Payer: Medicare Other | Admitting: Internal Medicine

## 2020-09-08 ENCOUNTER — Other Ambulatory Visit: Payer: Self-pay

## 2020-09-08 ENCOUNTER — Other Ambulatory Visit (HOSPITAL_COMMUNITY): Payer: Self-pay | Admitting: Internal Medicine

## 2020-09-08 DIAGNOSIS — L89619 Pressure ulcer of right heel, unspecified stage: Secondary | ICD-10-CM

## 2020-09-08 DIAGNOSIS — E1122 Type 2 diabetes mellitus with diabetic chronic kidney disease: Secondary | ICD-10-CM | POA: Diagnosis not present

## 2020-09-08 DIAGNOSIS — E11621 Type 2 diabetes mellitus with foot ulcer: Secondary | ICD-10-CM | POA: Diagnosis not present

## 2020-09-08 DIAGNOSIS — L89302 Pressure ulcer of unspecified buttock, stage 2: Secondary | ICD-10-CM | POA: Diagnosis not present

## 2020-09-08 DIAGNOSIS — L97811 Non-pressure chronic ulcer of other part of right lower leg limited to breakdown of skin: Secondary | ICD-10-CM | POA: Diagnosis not present

## 2020-09-08 DIAGNOSIS — L89616 Pressure-induced deep tissue damage of right heel: Secondary | ICD-10-CM | POA: Diagnosis not present

## 2020-09-08 DIAGNOSIS — N186 End stage renal disease: Secondary | ICD-10-CM | POA: Diagnosis not present

## 2020-09-08 DIAGNOSIS — L89610 Pressure ulcer of right heel, unstageable: Secondary | ICD-10-CM

## 2020-09-08 DIAGNOSIS — L97101 Non-pressure chronic ulcer of unspecified thigh limited to breakdown of skin: Secondary | ICD-10-CM | POA: Diagnosis not present

## 2020-09-08 DIAGNOSIS — L89620 Pressure ulcer of left heel, unstageable: Secondary | ICD-10-CM

## 2020-09-08 DIAGNOSIS — I7300 Raynaud's syndrome without gangrene: Secondary | ICD-10-CM | POA: Diagnosis not present

## 2020-09-08 DIAGNOSIS — I120 Hypertensive chronic kidney disease with stage 5 chronic kidney disease or end stage renal disease: Secondary | ICD-10-CM | POA: Diagnosis not present

## 2020-09-08 DIAGNOSIS — L89152 Pressure ulcer of sacral region, stage 2: Secondary | ICD-10-CM | POA: Diagnosis not present

## 2020-09-08 NOTE — Progress Notes (Signed)
[name], [name] ([id number]) Visit Report for 09/08/2020 Chief Complaint Document Details Patient Name: Date of Service: [place], [place] 09/08/2020 2:30 PM Medical Record Number: [id number] Patient Account Number: [id number] Date of Birth/Sex: Treating RN: Aug 26, 1965 (54 y.o. [name] [name] Primary Care Provider: [name] Other Clinician: Referring Provider: Treating Provider/Extender: [name] in Treatment: 9 Information Obtained from: Patient Chief Complaint Bilateral heel wounds Electronic Signature(s) Signed: 09/08/2020 5:09:23 PM By: Kalman Shan DO Entered By: Kalman Shan on 09/08/2020 17:01:39 -------------------------------------------------------------------------------- HPI Details Patient Name: Date of Service: [name] Mocha, MA [place]. 09/08/2020 2:30 PM Medical Record Number: [id number] Patient Account Number: [id number] Date of Birth/Sex: Treating RN: May 11, 1965 (55 y.o. [name] [name] Primary Care Provider: [name] Other Clinician: Referring Provider: Treating Provider/Extender: [name] in Treatment: 9 History of Present Illness HPI Description: 4/13 - [name] is a 55 year old male with past medical history of insulin-dependent type 2 diabetes, end-stage renal disease on hemodialysis, Raynaud's disease with bilateral hand and fingertip ulcers and essential hypertension that presents with bilateral heel ulcers. This has been an ongoing issue for the patient and has been following closely with podiatry. He had debridement and graft application preformed on 06/04/2020 due to worsening of the wounds. Imaging suggested osteo and he was treated with vancomycin and amoxicillin. Today he presents for further evaluation of the wounds. He is currently using Santyl on these areas daily. He uses a cam boot on the left and a surgical shoe on the right to help with offloading. He  denies any acute pain, fever/chills, increased warmth or erythema to the feet or purulent drainage. 5/9; unfortunately patient has not followed up to his regular appointments. Its been almost a month since I last saw him. He has been using Santyl daily to the wound beds. He states that he sits a lot in his chair with with his feet on the ground. He reports walking around a lot in the day doing his activities of daily living. He denies acute signs of infection. 5/23; patient presents for his 2-week follow-up. He has been using Santyl daily to the wounds and has been trying to offload his heels throughout the day. He states he has to sleep in a wheelchair and occasionally is able to elevate his legs. He denies acute signs of infection. 6/1; patient presents for 1 week follow-up. He has been using Santyl to the wounds however only has a little left and refills are not until 6/14. He was discharged as well by home health for unknown reasons. He continues to sleep in a wheelchair and cannot offload his heels. He states he is receiving a hospital bed today. He denies acute signs of infection. 6/6; patient presents for 1 week follow-up. He had just enough Santyl to cover to yesterday but does not have a refill until 6/14. He did not receive the supplies from home health. Despite having a hospital bed he continues to sleep in a wheelchair and cannot offload his heels. He denies acute signs of infection. He also developed a small wound to the area between his posterior thigh and buttocks. He has been placing a Band-Aid on this. 6/13; this is a patient with severe bilateral plantar heel wounds. He has been using Santyl and Hydrofera Blue. Followed for a prolonged period of time by podiatry. He received operative debridement and bone biopsy in February. He does not describe himself is that frequent and ambulator. He has bilateral heel offloading shoes  During the hospitalization in February he saw Dr. Donnetta Hutching of  vein and vascular he noted the atypical location of these wounds and noted that the noninvasive arterial studies revealed noncompressible vessels bilaterally but the toe brachial indexes on the left were normal and slightly diminished on the right. MRI showed no abscesses The patient is a type 2 diabetes on dialysis. 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 He was switched to silver alginate at last clinic visit. Overall his wounds continue to slightly worsen. He cannot relieve the pressure off his heels and states he continues to sleep in his wheelchair due to back pain. Electronic Signature(s) Signed: 09/08/2020 5:09:23 PM By: Kalman Shan DO Entered By: Kalman Shan on 09/08/2020 17:03:19 -------------------------------------------------------------------------------- Physical Exam Details Patient Name: Date of Service: [name] Mocha, MA [name] 09/08/2020 2:30 PM Medical Record Number: [id number] Patient Account Number: [id number] Date of Birth/Sex: Treating RN: [birth year] (55 y.o. [name] [name] Primary Care Provider: [name] Other Clinician: Referring Provider: Treating Provider/Extender: [name] in Treatment: 9 Constitutional respirations regular, non-labored and within target range for patient.[name] Psychiatric pleasant and cooperative. Notes Left and right calcaneus with nonviable surface that is very close to bone. Left upper thigh with wound limited to skin breakdown Electronic Signature(s) Signed: 09/08/2020 5:09:23 PM By: Kalman Shan DO Entered By: Kalman Shan on 09/08/2020 17:07:52 -------------------------------------------------------------------------------- Physician Orders Details Patient Name: Date of Service: [name] Mocha, MA [place] L. 09/08/2020 2:30 PM Medical Record Number: [id number] Patient Account Number: [id number] Date of  Birth/Sex: Treating RN: [birth year] (55 y.o. 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 - with Dr. Heber [place] Return A Bathing/ Shower/ Hygiene May shower and wash wound with soap and water. Edema Control - Lymphedema / SCD / Other Elevate legs to the level of the heart or above for 30 minutes daily and/or when sitting, a frequency of: - throughout the day Avoid standing for long periods of time. Exercise regularly Moisturize legs daily. Off-Loading Open toe surgical shoe to: - both feet Home Health No change in wound care orders this week; continue Home Health for wound care. May utilize formulary equivalent dressing for wound treatment orders unless otherwise specified. Dressing changes to be completed by [facility] on Monday / Wednesday / Friday except when patient has scheduled visit at [facility]. Other Home Health Orders/Instructions: - [facility] Wound Treatment Wound #2 - Calcaneus Wound Laterality: Right Cleanser: Normal Saline (Generic) 1 x Per Day/30 Days Discharge Instructions: Cleanse the wound with Normal Saline prior to applying a clean dressing using gauze sponges, not tissue or cotton balls. Cleanser: Wound Cleanser (Generic) 1 x Per Day/30 Days Discharge Instructions: Cleanse the wound with wound cleanser or normal saline prior to applying a clean dressing using gauze sponges, not  tissue or cotton balls. Prim Dressing: KerraCel Ag Gelling Fiber Dressing, 4x5 in (silver alginate) (Dispense As Written) 1 x Per Day/30 Days ary Discharge Instructions: Apply silver alginate to wound bed  as instructed Secondary Dressing: Woven Gauze Sponge, Non-Sterile 4x4 in (Generic) 1 x Per Day/30 Days Discharge Instructions: Apply over primary dressing as directed. Secondary Dressing: ABD Pad, 8x10 1 x Per Day/30 Days Discharge Instructions: Apply over primary dressing as directed. Secured With: [employer], 4.5x3.1 (in/yd) (Generic) 1 x Per Day/30 Days Discharge Instructions: Secure with Kerlix as directed. Secured With: Paper Tape, 2x10 (in/yd) (Generic) 1 x Per Day/30 Days Discharge Instructions: Secure dressing with tape as directed. Wound #3 - Calcaneus Wound Laterality: Left Cleanser: Normal Saline (Generic) 1 x Per Day/30 Days Discharge Instructions: Cleanse the wound with Normal Saline prior to applying a clean dressing using gauze sponges, not tissue or cotton balls. Cleanser: Wound Cleanser (Generic) 1 x Per Day/30 Days Discharge Instructions: Cleanse the wound with wound cleanser or normal saline prior to applying a clean dressing using gauze sponges, not tissue or cotton balls. Prim Dressing: KerraCel Ag Gelling Fiber Dressing, 4x5 in (silver alginate) (Dispense As Written) 1 x Per Day/30 Days ary Discharge Instructions: Apply silver alginate to wound bed as instructed Secondary Dressing: Woven Gauze Sponge, Non-Sterile 4x4 in (Generic) 1 x Per Day/30 Days Discharge Instructions: Apply over primary dressing as directed. Secondary Dressing: ABD Pad, 8x10 1 x Per Day/30 Days Discharge Instructions: Apply over primary dressing as directed. Secured With: [employer], 4.5x3.1 (in/yd) (Generic) 1 x Per Day/30 Days Discharge Instructions: Secure with Kerlix as directed. Secured With: Paper Tape, 2x10 (in/yd) (Generic) 1 x Per Day/30 Days Discharge Instructions:  Secure dressing with tape as directed. Wound #5 - Gluteal fold Wound Laterality: Left Cleanser: Soap and Water 1 x Per Day/30 Days Discharge Instructions: May shower and wash wound with dial antibacterial soap and water prior to dressing change. Prim Dressing: KerraCel Ag Gelling Fiber Dressing, 2x2 in (silver alginate) 1 x Per Day/30 Days ary Discharge Instructions: Apply silver alginate to wound bed as instructed Secondary Dressing: Bordered Gauze, 2x3.75 in 1 x Per Day/30 Days Discharge Instructions: Apply over primary dressing as directed. Radiology MRI, lower extremity without contrast bilateral heels - unstageable pressure ulcers bilateral calcaneus, r/o osteomyelitis, ESRD CPT 73718 - (ICD10 L89.619 - Pressure ulcer of right heel, unspecified stage) Electronic Signature(s) Signed: 09/08/2020 5:09:23 PM By: Kalman Shan DO Entered By: Kalman Shan on 09/08/2020 17:05:14 Prescription 09/08/2020 -------------------------------------------------------------------------------- [place], Davin L. Kalman Shan DO Patient Name: Provider: Jun 20, 1965 [id number] Date of Birth: NPI#: Jerilynn Mages [id number] Sex: DEA #: [phone] Phone #: License #: [place] Patient Address: [address], [place] [address] [address], [place] [address] [phone] Allergies No Known Drug Allergies Provider's Orders MRI, lower extremity without contrast bilateral heels - ICD10: L89.619 - unstageable pressure ulcers bilateral calcaneus, r/o osteomyelitis, ESRD CPT 73718 Hand Signature: Date(s): Electronic Signature(s) Signed: 09/08/2020 5:09:23 PM By: Kalman Shan DO Entered By: Kalman Shan on 09/08/2020 17:05:14 -------------------------------------------------------------------------------- Problem List Details Patient Name: Date of Service: [name] Mocha, MA [name] 09/08/2020 2:30 PM Medical Record Number:  [id number] Patient Account Number: [id number] Date of Birth/Sex: Treating RN: Dec 29, 1965 (54 y.o. 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 09/08/2020 2:30 PM Medical Record Number: [id number] Patient Account Number: [id number] Date of Birth/Sex: Treating RN: 12-18-1965 (55 y.o. [name] [name] Primary Care Provider: [name] Other Clinician: Referring Provider: Treating Provider/Extender: [name] in Treatment: 9 Subjective Chief Complaint Information obtained from Patient Bilateral heel wounds History of Present Illness (HPI) 4/13 - [name] [name] is a 55 year old male with past medical history of insulin-dependent type 2 diabetes, end-stage renal disease on hemodialysis, Raynaud's disease with bilateral hand and fingertip ulcers and essential hypertension that presents with bilateral heel ulcers. This has been an ongoing issue for the  patient and has been following closely with podiatry. He had debridement and graft application preformed on 06/04/2020 due to worsening of the wounds. Imaging suggested osteo and he was treated with vancomycin and amoxicillin. Today he presents for further evaluation of the wounds. He is currently using Santyl on these areas daily. He uses a cam boot on the left and a surgical shoe on the right to help with offloading. He denies any acute pain, fever/chills, increased warmth or erythema to the feet or purulent drainage. 5/9; unfortunately patient has not followed up to his regular appointments. Its been almost a month since I last saw him. He has been using Santyl daily to the wound beds. He states that he sits a lot in his chair with with his feet on the ground. He reports walking around a lot in the day doing his activities of daily living. He denies acute signs of infection. 5/23; patient presents for his 2-week follow-up. He has been using Santyl daily to the wounds and has been trying to offload his heels throughout the day. He states he has to sleep in a wheelchair and occasionally is able to elevate his legs. He denies acute signs of infection. 6/1; patient presents for 1 week follow-up. He has been using Santyl to the wounds however only has a little left and refills are not until 6/14. He was discharged as well by home health for unknown reasons. He continues to sleep in a wheelchair and cannot offload his heels. He states he is receiving a hospital bed today. He denies acute signs of infection. 6/6; patient presents for 1 week follow-up. He had just enough Santyl to cover to yesterday but does not have a refill until 6/14. He did not receive the supplies from home health. Despite having a hospital bed he continues to sleep in a wheelchair and cannot offload his heels. He denies acute signs of infection. He also developed a small wound to the area between his posterior thigh and buttocks. He  has been placing a Band-Aid on this. 6/13; this is a patient with severe bilateral plantar heel wounds. He has been using Santyl and Hydrofera Blue. Followed for a prolonged period of time by podiatry. He received operative debridement and bone biopsy in February. He does not describe himself is that frequent and ambulator. He has bilateral heel offloading shoes During the hospitalization in February he saw Dr. Donnetta Hutching of vein and vascular he noted the atypical location of these wounds and noted that  the noninvasive arterial studies revealed noncompressible vessels bilaterally but the toe brachial indexes on the left were normal and slightly diminished on the right. MRI showed no abscesses The patient is a type 2 diabetes on dialysis. 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 He was switched to silver alginate at last clinic visit. Overall his wounds continue to slightly worsen. He cannot relieve the pressure off his heels and states he continues to sleep in his wheelchair due to back pain. Patient History Information obtained from Patient. Family History Unknown History. Social History Never smoker, Marital Status - Single, Alcohol Use - Rarely, Drug Use - No History, Caffeine Use - Rarely. 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 Vitals Time Taken: 3:06 PM, Height: 65 in, Source: Stated, Weight: 240 lbs, Source: Stated, BMI: 39.9, Temperature: 99.1 F, Pulse: 106 bpm, Respiratory Rate: 18 breaths/min, Blood Pressure: 113/57 mmHg, Capillary Blood Glucose: 154 mg/dl. General Notes: glucose per pt report this am Psychiatric pleasant and cooperative. General Notes: Left and right calcaneus with nonviable surface that is very close to bone. Left upper thigh with wound limited to skin breakdown Integumentary (Hair, Skin) Wound #2 status is Open. Original cause of wound was Pressure Injury. The date acquired was: 04/22/2020. The wound has been in treatment 9 weeks. The wound is located on the Right Calcaneus. The wound measures 7.5cm length x 7.7cm width x 1.1cm depth; 45.357cm^2 area and 49.892cm^3 volume. There is bone and Fat Layer (Subcutaneous Tissue) exposed. There is no tunneling or undermining noted. There is a large amount of purulent drainage noted. Foul odor after cleansing was noted. The wound margin is well defined and not attached to the wound base. There is small (1-33%) pink granulation within the wound bed. There is a large (67-100%) amount of necrotic tissue within the wound bed including Eschar and Adherent Slough. Wound #3 status is Open. Original cause of wound was Pressure Injury. The date acquired was: 04/22/2020. The wound has been in treatment 9 weeks. The wound is located on the Left Calcaneus. The wound measures 7.4cm length x 6.4cm width x 1cm depth; 37.196cm^2 area and 37.196cm^3 volume. There is Fat Layer (Subcutaneous Tissue) exposed. There is no tunneling or undermining noted. There is a large amount of serosanguineous drainage noted. Foul odor after cleansing was noted. The wound margin is well defined and not attached to the wound base. There is small (1-33%) pink granulation within the wound bed. There is a medium (34-66%) amount of necrotic tissue within the wound bed including Eschar and  Adherent Slough. Wound #5 status is Open. Original cause of wound was Shear/Friction. The date acquired was: 08/22/2020. The wound has been in treatment 2 weeks. The wound is located on the Left Gluteal fold. The wound measures 2cm length x 2.4cm width x 0.1cm depth; 3.77cm^2 area and 0.377cm^3 volume. There is Fat Layer (Subcutaneous Tissue) exposed. There is no tunneling or undermining noted. There is a medium amount of serosanguineous drainage noted. The wound margin is distinct with the  outline attached to the wound base. There is large (67-100%) red, pink granulation within the wound bed. There is no necrotic tissue within the wound bed. 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 I am concerned that he is not able to relieve the pressure to allow these to heal. I will go ahead and order MRIs bilaterally since I can palpate bone. We discussed aggressive offloading. The left upper thigh wound appears well-healing and is limited to skin breakdown. She can continue silver alginate to this area. Plan Follow-up Appointments: Return Appointment in 1 week. - with Dr. Heber [place] Bathing/ Shower/ Hygiene: May shower and wash wound with soap and water. Edema Control - Lymphedema / SCD / Other: Elevate legs to the level of the heart or above for 30 minutes daily and/or when sitting, a frequency of: - throughout the day Avoid standing for long periods of time. Exercise regularly Moisturize legs daily. Off-Loading: Open toe surgical shoe to: - both feet Home Health: No change in wound care orders this week; continue Home Health for wound care. May utilize formulary equivalent dressing for wound treatment orders  unless otherwise specified. Dressing changes to be completed by [facility] on Monday / Wednesday / Friday except when patient has scheduled visit at [facility]. Other Home Health Orders/Instructions: [id number] Radiology ordered were: MRI, lower extremity without contrast bilateral heels - unstageable pressure ulcers bilateral calcaneus, r/o osteomyelitis, ESRD CPT [id number] WOUND #2: - Calcaneus Wound Laterality: Right Cleanser: Normal Saline (Generic) 1 x Per Day/30 Days Discharge Instructions: Cleanse the wound with Normal Saline prior to applying a clean dressing using gauze sponges, not tissue or cotton balls. Cleanser: Wound Cleanser (Generic) 1 x Per Day/30 Days Discharge Instructions: Cleanse the wound with wound cleanser or normal saline prior to applying a clean dressing using gauze sponges, not tissue or cotton balls. Prim Dressing: KerraCel Ag Gelling Fiber Dressing, 4x5 in (silver alginate) (Dispense As Written) 1 x Per Day/30 Days ary Discharge Instructions: Apply silver alginate to wound bed as instructed Secondary Dressing: Woven Gauze Sponge, Non-Sterile 4x4 in (Generic) 1 x Per Day/30 Days Discharge Instructions: Apply over primary dressing as directed. Secondary Dressing: ABD Pad, 8x10 1 x Per Day/30 Days Discharge Instructions: Apply over primary dressing as directed. Secured With: [employer], 4.5x3.1 (in/yd) (Generic) 1 x Per Day/30 Days Discharge Instructions: Secure with Kerlix as directed. Secured With: Paper T ape, 2x10 (in/yd) (Generic) 1 x Per Day/30 Days Discharge Instructions: Secure dressing with tape as directed. WOUND #3: - Calcaneus Wound Laterality: Left Cleanser: Normal Saline (Generic) 1 x Per Day/30 Days Discharge Instructions: Cleanse the wound with Normal Saline prior to applying a clean dressing using gauze sponges, not tissue or cotton balls. Cleanser: Wound Cleanser (Generic) 1 x Per Day/30 Days Discharge Instructions: Cleanse the wound  with wound cleanser or normal saline prior to applying a clean dressing using gauze sponges, not tissue or cotton balls. Prim Dressing: KerraCel Ag Gelling Fiber Dressing, 4x5 in (silver alginate) (Dispense As Written) 1 x Per Day/30 Days ary Discharge Instructions: Apply silver alginate to wound bed as instructed Secondary Dressing: Woven Gauze Sponge, Non-Sterile 4x4 in (Generic) 1 x Per Day/30 Days Discharge Instructions: Apply over primary dressing as directed. Secondary Dressing: ABD Pad,  8x10 1 x Per Day/30 Days Discharge Instructions: Apply over primary dressing as directed. Secured With: [employer], 4.5x3.1 (in/yd) (Generic) 1 x Per Day/30 Days Discharge Instructions: Secure with Kerlix as directed. Secured With: Paper T ape, 2x10 (in/yd) (Generic) 1 x Per Day/30 Days Discharge Instructions: Secure dressing with tape as directed. WOUND #5: - Gluteal fold Wound Laterality: Left Cleanser: Soap and Water 1 x Per Day/30 Days Discharge Instructions: May shower and wash wound with dial antibacterial soap and water prior to dressing change. Prim Dressing: KerraCel Ag Gelling Fiber Dressing, 2x2 in (silver alginate) 1 x Per Day/30 Days ary Discharge Instructions: Apply silver alginate to wound bed as instructed Secondary Dressing: Bordered Gauze, 2x3.75 in 1 x Per Day/30 Days Discharge Instructions: Apply over primary dressing as directed. 1. Aggressive offloading 2. MRI of feet bilaterally 3. Silver alginate 4. Follow-up in 1 week Electronic Signature(s) Signed: 09/08/2020 5:09:23 PM By: Kalman Shan DO Entered By: Kalman Shan on 09/08/2020 17:08:41 -------------------------------------------------------------------------------- HxROS Details Patient Name: Date of Service: [name] Mocha, MA [name] 09/08/2020 2:30 PM Medical Record Number: [id number] Patient Account Number: [id number] Date of Birth/Sex: Treating RN: [birth year] (55 y.o. [name] [name] Primary Care  Provider: [name] Other Clinician: Referring Provider: Treating Provider/Extender: [name] in Treatment: 9 Information Obtained From Patient Hematologic/Lymphatic Medical History: Positive for: Anemia Cardiovascular Medical History: Positive for: Congestive Heart Failure; Hypertension Past Medical History Notes: Cardiomyopathy, arterial clots Endocrine Medical History: Positive for: Type II Diabetes Treated with: Insulin Blood sugar tested every day: Yes Tested : 2-3x a day Genitourinary Medical History: Positive for: End Stage Renal Disease Past Medical History Notes: Hemodiaylsis Immunological Medical History: Positive for: Raynauds Past Medical History Notes: 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 Neurologic Medical History: Positive for: Neuropathy Immunizations Pneumococcal Vaccine: Received Pneumococcal Vaccination: Yes Implantable Devices None Family and Social History Unknown History: Yes; Never smoker; Marital Status - Single; Alcohol Use: Rarely; Drug Use: No History; Caffeine Use: Rarely; Financial Concerns: No; Food, Clothing or Shelter Needs: No; Support System Lacking: No; Transportation Concerns: No Electronic Signature(s) Signed: 09/08/2020 5:09:23 PM By: Kalman Shan DO Signed: 09/08/2020 5:37:12 PM By: Levan Hurst RN, BSN Entered By: Kalman Shan on 09/08/2020 17:03:26 -------------------------------------------------------------------------------- [place] Details Patient Name: Date of Service: [name] Mocha, MA [place] L. 09/08/2020 Medical Record Number: [id number] Patient Account Number: [id number] Date of Birth/Sex: Treating RN: 10/18/1965 (55 y.o. 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

## 2020-09-09 DIAGNOSIS — N2581 Secondary hyperparathyroidism of renal origin: Secondary | ICD-10-CM | POA: Diagnosis not present

## 2020-09-09 DIAGNOSIS — N186 End stage renal disease: Secondary | ICD-10-CM | POA: Diagnosis not present

## 2020-09-09 DIAGNOSIS — D631 Anemia in chronic kidney disease: Secondary | ICD-10-CM | POA: Diagnosis not present

## 2020-09-10 DIAGNOSIS — Z79891 Long term (current) use of opiate analgesic: Secondary | ICD-10-CM | POA: Diagnosis not present

## 2020-09-10 DIAGNOSIS — M79609 Pain in unspecified limb: Secondary | ICD-10-CM | POA: Diagnosis not present

## 2020-09-10 DIAGNOSIS — G894 Chronic pain syndrome: Secondary | ICD-10-CM | POA: Diagnosis not present

## 2020-09-10 DIAGNOSIS — E1122 Type 2 diabetes mellitus with diabetic chronic kidney disease: Secondary | ICD-10-CM | POA: Diagnosis not present

## 2020-09-10 DIAGNOSIS — I7300 Raynaud's syndrome without gangrene: Secondary | ICD-10-CM | POA: Diagnosis not present

## 2020-09-10 DIAGNOSIS — L89616 Pressure-induced deep tissue damage of right heel: Secondary | ICD-10-CM | POA: Diagnosis not present

## 2020-09-10 DIAGNOSIS — I120 Hypertensive chronic kidney disease with stage 5 chronic kidney disease or end stage renal disease: Secondary | ICD-10-CM | POA: Diagnosis not present

## 2020-09-10 DIAGNOSIS — N186 End stage renal disease: Secondary | ICD-10-CM | POA: Diagnosis not present

## 2020-09-10 DIAGNOSIS — G629 Polyneuropathy, unspecified: Secondary | ICD-10-CM | POA: Diagnosis not present

## 2020-09-10 DIAGNOSIS — L89620 Pressure ulcer of left heel, unstageable: Secondary | ICD-10-CM | POA: Diagnosis not present

## 2020-09-11 DIAGNOSIS — N186 End stage renal disease: Secondary | ICD-10-CM | POA: Diagnosis not present

## 2020-09-11 DIAGNOSIS — N2581 Secondary hyperparathyroidism of renal origin: Secondary | ICD-10-CM | POA: Diagnosis not present

## 2020-09-11 DIAGNOSIS — D631 Anemia in chronic kidney disease: Secondary | ICD-10-CM | POA: Diagnosis not present

## 2020-09-11 NOTE — Progress Notes (Signed)
RENLY, ROOTS ([id number]) Visit Report for 09/08/2020 Arrival Information Details Patient Name: Date of Service: [name] [name], [place] 09/08/2020 2:30 PM Medical Record Number: [id number] Patient Account Number: [id number] Date of Birth/Sex: Treating RN: [birth year] (55 y.o. [name] Primary Care Valentina Alcoser: [name] Other Clinician: Referring Wyonia Fontanella: Treating Glenna Brunkow/Extender: [name] in Treatment: 9 Visit Information History Since Last Visit Added or deleted any medications: No Patient Arrived: Wheel Chair Any new allergies or adverse reactions: No Arrival Time: 14:53 Had a fall or experienced change in No Accompanied By: mother activities of daily living that may affect Transfer Assistance: None risk of falls: Patient Identification Verified: Yes Signs or symptoms of abuse/neglect since No Secondary Verification Process Completed: Yes last visito Patient Requires Transmission-Based Precautions: No Hospitalized since last visit: No Patient Has Alerts: Yes Implantable device outside of the clinic No Patient Alerts: R TBI: 0.56 excluding L TBI: 0.75 cellular tissue based products placed in the center since last visit: Has Dressing in Place as Prescribed: Yes Has Footwear/Offloading in Place as Yes Prescribed: Left: Surgical Shoe with Pressure Relief Insole Right: Surgical Shoe with Pressure Relief Insole Pain Present Now: Yes Electronic Signature(s) Signed: 09/08/2020 6:32:01 PM By: Baruch Gouty RN, BSN Entered By: Baruch Gouty on 09/08/2020 15:06:27 -------------------------------------------------------------------------------- Clinic Level of Care Assessment Details Patient Name: Date of Service: [name] [name], [name] Preston L. 09/08/2020 2:30 PM Medical Record Number: [id number] Patient Account Number: [id number] Date of Birth/Sex: Treating RN: 05-29-1965 (55 y.o. [name] Primary Care [name]: [name] Other Clinician: Referring Nayan Proch: Treating Mel Langan/Extender: [name] in Treatment: 9 Clinic Level of Care Assessment Items TOOL 4 Quantity Score []  - 0 Use when only an EandM is performed on FOLLOW-UP visit ASSESSMENTS - Nursing Assessment / Reassessment X- 1 10 Reassessment of Co-morbidities (includes updates in patient status) X- 1 5 Reassessment of Adherence to Treatment Plan ASSESSMENTS - Wound and Skin A ssessment / Reassessment []  - 0 Simple Wound Assessment / Reassessment - one wound X- 3 5 Complex Wound Assessment / Reassessment - multiple wounds []  - 0 Dermatologic / Skin Assessment (not related to wound area) ASSESSMENTS - Focused Assessment X- 2 5 Circumferential Edema Measurements - multi extremities []  - 0 Nutritional Assessment / Counseling / Intervention X- 1 5 Lower Extremity Assessment (monofilament, tuning fork, pulses) []  - 0 Peripheral Arterial Disease Assessment (using hand held doppler) ASSESSMENTS - Ostomy and/or Continence Assessment and Care []  - 0 Incontinence Assessment and Management []  - 0 Ostomy Care Assessment and Management (repouching, etc.) PROCESS - Coordination of Care X - Simple Patient / Family Education for ongoing care 1 15 []  - 0 Complex (extensive) Patient / Family Education for ongoing care X- 1 10 Staff obtains Programmer, systems, Records, T Results / Process Orders est X- 1 10 Staff telephones HHA, Nursing Homes / Clarify orders / etc []  - 0 Routine Transfer to another Facility (non-emergent condition) []  - 0 Routine Hospital Admission (non-emergent condition) []  - 0 New Admissions / Biomedical engineer / Ordering NPWT Apligraf, etc. , []  - 0 Emergency Hospital Admission (emergent condition) X- 1 10 Simple Discharge Coordination []  - 0 Complex (extensive) Discharge Coordination PROCESS - Special Needs []  - 0 Pediatric / Minor Patient Management []  - 0 Isolation Patient  Management []  - 0 Hearing / Language / Visual special needs []  - 0 Assessment of Community assistance (transportation, D/C planning, etc.) []  - 0 Additional assistance / Altered mentation []  - 0 Support  Surface(s) Assessment (bed, cushion, seat, etc.) INTERVENTIONS - Wound Cleansing / Measurement $RemoveBefor'[]'UbbnSYUVTBkK$  - 0 Simple Wound Cleansing - one wound X- 3 5 Complex Wound Cleansing - multiple wounds X- 1 5 Wound Imaging (photographs - any number of wounds) $RemoveBe'[]'InDvpixAx$  - 0 Wound Tracing (instead of photographs) $RemoveBeforeD'[]'VdIdcWruQopVJp$  - 0 Simple Wound Measurement - one wound X- 3 5 Complex Wound Measurement - multiple wounds INTERVENTIONS - Wound Dressings X - Small Wound Dressing one or multiple wounds 3 10 $Re'[]'GbJ$  - 0 Medium Wound Dressing one or multiple wounds $RemoveBeforeD'[]'QDogORgsdRPCyw$  - 0 Large Wound Dressing one or multiple wounds X- 1 5 Application of Medications - topical $RemoveB'[]'sYNxiFiS$  - 0 Application of Medications - injection INTERVENTIONS - Miscellaneous $RemoveBeforeD'[]'tIMBwByQXQpEGW$  - 0 External ear exam $Remove'[]'xLYDdje$  - 0 Specimen Collection (cultures, biopsies, blood, body fluids, etc.) $RemoveBefor'[]'ZyvkJJrEIkhO$  - 0 Specimen(s) / Culture(s) sent or taken to Lab for analysis $RemoveBefo'[]'YCkVWmHnwiO$  - 0 Patient Transfer (multiple staff / Civil Service fast streamer / Similar devices) $RemoveBeforeDE'[]'hfNCUhBcGvwbCkr$  - 0 Simple Staple / Suture removal (25 or less) $Remove'[]'buLdWcv$  - 0 Complex Staple / Suture removal (26 or more) $Remove'[]'nUdhJkS$  - 0 Hypo / Hyperglycemic Management (close monitor of Blood Glucose) $RemoveBefore'[]'qSwGLqwygnyiL$  - 0 Ankle / Brachial Index (ABI) - do not check if billed separately X- 1 5 Vital Signs Has the patient been seen at the hospital within the last three years: Yes Total Score: 165 Level Of Care: New/Established - Level 5 Electronic Signature(s) Signed: 09/08/2020 6:32:01 PM By: Baruch Gouty RN, BSN Entered By: Baruch Gouty on 09/08/2020 16:23:00 -------------------------------------------------------------------------------- Encounter Discharge Information Details Patient Name: Date of Service: [name] Mocha, MA [name] 09/08/2020 2:30 PM Medical Record Number:  [id number] Patient Account Number: [id number] Date of Birth/Sex: Treating RN: 1965-03-24 (55 y.o. [name] Primary Care Coben Godshall: [name] Other Clinician: Referring Ilias Stcharles: Treating Astoria Condon/Extender: [name] in Treatment: 9 Encounter Discharge Information Items Discharge Condition: Stable Ambulatory Status: Wheelchair Discharge Destination: Home Transportation: Private Auto Accompanied By: mother Schedule Follow-up Appointment: Yes Clinical Summary of Care: Patient Declined Electronic Signature(s) Signed: 09/08/2020 6:32:01 PM By: Baruch Gouty RN, BSN Entered By: Baruch Gouty on 09/08/2020 16:22:05 -------------------------------------------------------------------------------- Lower Extremity Assessment Details Patient Name: Date of Service: [name] Mocha, MA [name] 09/08/2020 2:30 PM Medical Record Number: [id number] Patient Account Number: [id number] Date of Birth/Sex: Treating RN: 1965-03-25 (55 y.o. [name] Primary Care Jakyren Fluegge: [name] Other Clinician: Referring Sway Guttierrez: Treating Terrell Shimko/Extender: [name] in Treatment: 9 Edema Assessment Assessed: Shirlyn Goltz: No] Patrice Paradise: No] Edema: [Left: Yes] [Right: Yes] Calf Left: Right: Point of Measurement: 34 cm From Medial Instep 48.5 cm 46 cm Ankle Left: Right: Point of Measurement: 12 cm From Medial Instep 26.6 cm 26.5 cm Vascular Assessment Pulses: Dorsalis Pedis Palpable: [Left:No] [Right:No] Electronic Signature(s) Signed: 09/08/2020 6:32:01 PM By: Baruch Gouty RN, BSN Entered By: Baruch Gouty on 09/08/2020 15:22:42 -------------------------------------------------------------------------------- Multi Wound Chart Details Patient Name: Date of Service: [name] Mocha, MA [name] 09/08/2020 2:30 PM Medical Record Number: [id number] Patient Account Number: [id number] Date of Birth/Sex: Treating RN: [birth year] (55  y.o. [name] Primary Care Akylah Hascall: [name] Other Clinician: Referring Jonia Oakey: Treating Peytin Dechert/Extender: [name] in Treatment: 9 Vital Signs Height(in): 65 Capillary Blood Glucose(mg/dl): 154 Weight(lbs): 240 Pulse(bpm): 106 Body Mass Index(BMI): 40 Blood Pressure(mmHg): 113/57 Temperature(F): 99.1 Respiratory Rate(breaths/min): 18 Photos: Right Calcaneus Left Calcaneus Left Gluteal fold Wound Location: Pressure Injury Pressure Injury Shear/Friction Wounding Event: Diabetic Wound/Ulcer of the Lower Diabetic Wound/Ulcer of the Lower Pressure Ulcer Primary Etiology:  Extremity Extremity Anemia, Congestive Heart Failure, Anemia, Congestive Heart Failure, Anemia, Congestive Heart Failure, Comorbid History: Hypertension, Type II Diabetes, End Hypertension, Type II Diabetes, End Hypertension, Type II Diabetes, End Stage Renal Disease, Raynauds, Stage Renal Disease, Raynauds, Stage Renal Disease, Raynauds, Neuropathy Neuropathy Neuropathy 04/22/2020 04/22/2020 08/22/2020 Date Acquired: '9 9 2 '$ Weeks of Treatment: Open Open Open Wound Status: No No Yes Clustered Wound: 7.5x7.7x1.1 7.4x6.4x1 2x2.4x0.1 Measurements L x W x D (cm) 45.357 37.196 3.77 A (cm) : rea 49.892 37.196 0.377 Volume (cm) : -327.80% -95.70% -20.00% % Reduction in A rea: -2252.30% -117.40% -20.10% % Reduction in Volume: Grade 2 Grade 2 Category/Stage II Classification: Large Large Medium Exudate A mount: Purulent Serosanguineous Serosanguineous Exudate Type: yellow, brown, green red, brown red, brown Exudate Color: Yes Yes No Foul Odor A Cleansing: fter No No N/A Odor A nticipated Due to Product Use: Well defined, not attached Well defined, not attached Distinct, outline attached Wound Margin: Small (1-33%) Small (1-33%) Large (67-100%) Granulation A mount: Pink Pink Red, Pink Granulation Quality: Large (67-100%) Medium (34-66%) None  Present (0%) Necrotic Amount: Eschar, Adherent Slough Eschar, Adherent Slough N/A Necrotic Tissue: Fat Layer (Subcutaneous Tissue): Yes Fat Layer (Subcutaneous Tissue): Yes Fat Layer (Subcutaneous Tissue): Yes Exposed Structures: Bone: Yes Fascia: No Fascia: No Fascia: No Tendon: No Tendon: No Tendon: No Muscle: No Muscle: No Muscle: No Joint: No Joint: No Joint: No Bone: No Bone: No Small (1-33%) Small (1-33%) Medium (34-66%) Epithelialization: Treatment Notes Wound #2 (Calcaneus) Wound Laterality: Right Cleanser Normal Saline Discharge Instruction: Cleanse the wound with Normal Saline prior to applying a clean dressing using gauze sponges, not tissue or cotton balls. Wound Cleanser Discharge Instruction: Cleanse the wound with wound cleanser or normal saline prior to applying a clean dressing using gauze sponges, not tissue or cotton balls. Peri-Wound Care Topical Primary Dressing KerraCel Ag Gelling Fiber Dressing, 4x5 in (silver alginate) Discharge Instruction: Apply silver alginate to wound bed as instructed Secondary Dressing Woven Gauze Sponge, Non-Sterile 4x4 in Discharge Instruction: Apply over primary dressing as directed. ABD Pad, 8x10 Discharge Instruction: Apply over primary dressing as directed. Secured With [employer], 4.5x3.1 (in/yd) Discharge Instruction: Secure with Kerlix as directed. Paper Tape, 2x10 (in/yd) Discharge Instruction: Secure dressing with tape as directed. Compression Wrap Compression Stockings Add-Ons Wound #3 (Calcaneus) Wound Laterality: Left Cleanser Normal Saline Discharge Instruction: Cleanse the wound with Normal Saline prior to applying a clean dressing using gauze sponges, not tissue or cotton balls. Wound Cleanser Discharge Instruction: Cleanse the wound with wound cleanser or normal saline prior to applying a clean dressing using gauze sponges, not tissue or cotton balls. Peri-Wound Care Topical Primary  Dressing KerraCel Ag Gelling Fiber Dressing, 4x5 in (silver alginate) Discharge Instruction: Apply silver alginate to wound bed as instructed Secondary Dressing Woven Gauze Sponge, Non-Sterile 4x4 in Discharge Instruction: Apply over primary dressing as directed. ABD Pad, 8x10 Discharge Instruction: Apply over primary dressing as directed. Secured With [employer], 4.5x3.1 (in/yd) Discharge Instruction: Secure with Kerlix as directed. Paper Tape, 2x10 (in/yd) Discharge Instruction: Secure dressing with tape as directed. Compression Wrap Compression Stockings Add-Ons Wound #5 (Gluteal fold) Wound Laterality: Left Cleanser Soap and Water Discharge Instruction: May shower and wash wound with dial antibacterial soap and water prior to dressing change. Peri-Wound Care Topical Primary Dressing KerraCel Ag Gelling Fiber Dressing, 2x2 in (silver alginate) Discharge Instruction: Apply silver alginate to wound bed as instructed Secondary Dressing Bordered Gauze, 2x3.75 in Discharge Instruction: Apply over primary dressing as directed.  Secured With Compression Wrap Compression Stockings Environmental education officer) Signed: 09/08/2020 5:09:23 PM By: Kalman Shan DO Signed: 09/08/2020 5:37:12 PM By: Levan Hurst RN, BSN Entered By: Kalman Shan on 09/08/2020 17:01:29 -------------------------------------------------------------------------------- Multi-Disciplinary Care Plan Details Patient Name: Date of Service: [name] Mocha, MA [name] 09/08/2020 2:30 PM Medical Record Number: [id number] Patient Account Number: [id number] Date of Birth/Sex: Treating RN: [birth year] (55 y.o. [name] Primary Care Albin Duckett: [name] Other Clinician: Referring Aquiles Ruffini: Treating Deona Novitski/Extender: [name] in Treatment: 9 Multidisciplinary Care Plan reviewed with physician Active Inactive Abuse / Safety / Falls / Self Care  Management Nursing Diagnoses: Potential for falls Potential for injury related to falls Goals: Patient will not experience any injury related to falls Date Initiated: 07/01/2020 Target Resolution Date: 10/03/2020 Goal Status: Active Patient/caregiver will verbalize/demonstrate measures taken to prevent injury and/or falls Date Initiated: 07/01/2020 Date Inactivated: 08/11/2020 Target Resolution Date: 08/08/2020 Goal Status: Met Interventions: Assess Activities of Daily Living upon admission and as needed Assess fall risk on admission and as needed Assess: immobility, friction, shearing, incontinence upon admission and as needed Assess impairment of mobility on admission and as needed per policy Assess personal safety and home safety (as indicated) on admission and as needed Provide education on fall prevention Provide education on personal and home safety Notes: Nutrition Nursing Diagnoses: Impaired glucose control: actual or potential Potential for alteratiion in Nutrition/Potential for imbalanced nutrition Goals: Patient/caregiver agrees to and verbalizes understanding of need to use nutritional supplements and/or vitamins as prescribed Date Initiated: 07/01/2020 Date Inactivated: 08/11/2020 Target Resolution Date: 08/08/2020 Goal Status: Met Patient/caregiver will maintain therapeutic glucose control Date Initiated: 07/01/2020 Target Resolution Date: 10/03/2020 Goal Status: Active Interventions: Assess HgA1c results as ordered upon admission and as needed Assess patient nutrition upon admission and as needed per policy Provide education on elevated blood sugars and impact on wound healing Provide education on nutrition Treatment Activities: Education provided on Nutrition : 07/01/2020 Notes: Wound/Skin Impairment Nursing Diagnoses: Impaired tissue integrity Knowledge deficit related to ulceration/compromised skin integrity Goals: Patient/caregiver will verbalize  understanding of skin care regimen Date Initiated: 07/01/2020 Target Resolution Date: 10/03/2020 Goal Status: Active Ulcer/skin breakdown will have a volume reduction of 30% by week 4 Date Initiated: 07/01/2020 Date Inactivated: 08/11/2020 Target Resolution Date: 08/08/2020 Unmet Reason: eschar requiring Goal Status: Unmet multiple debridements Ulcer/skin breakdown will have a volume reduction of 50% by week 8 Date Initiated: 08/11/2020 Date Inactivated: 09/08/2020 Target Resolution Date: 09/05/2020 Goal Status: Unmet Unmet Reason: increased bioburden Ulcer/skin breakdown will have a volume reduction of 80% by week 12 Date Initiated: 09/08/2020 Target Resolution Date: 10/03/2020 Goal Status: Active Interventions: Assess patient/caregiver ability to obtain necessary supplies Assess patient/caregiver ability to perform ulcer/skin care regimen upon admission and as needed Assess ulceration(s) every visit Provide education on ulcer and skin care Notes: Electronic Signature(s) Signed: 09/08/2020 6:32:01 PM By: Baruch Gouty RN, BSN Entered By: Baruch Gouty on 09/08/2020 15:53:34 -------------------------------------------------------------------------------- Pain Assessment Details Patient Name: Date of Service: [name] Mocha, MA [place] L. 09/08/2020 2:30 PM Medical Record Number: [id number] Patient Account Number: [id number] Date of Birth/Sex: Treating RN: 1965/08/06 (55 y.o. [name] Primary Care Aneliz Carbary: [name] Other Clinician: Referring Grover Woodfield: Treating Rosemary Pentecost/Extender: [name] in Treatment: 9 Active Problems Location of Pain Severity and Description of Pain Patient Has Paino Yes Site Locations Pain Location: Generalized Pain, Pain in Ulcers With Dressing Change: Yes Duration of the Pain. Constant / Intermittento Intermittent Rate the pain. Current Pain Level: 8 Least  Pain Level: 0 Character of Pain Describe the Pain:  Aching, Dull Pain Management and Medication Current Pain Management: Medication: Yes Is the Current Pain Management Adequate: Adequate Rest: Yes How does your wound impact your activities of daily livingo Sleep: Yes Bathing: No Appetite: No Relationship With Others: No Bladder Continence: No Emotions: No Bowel Continence: No Hobbies: No Toileting: No Dressing: No Electronic Signature(s) Signed: 09/08/2020 6:32:01 PM By: Baruch Gouty RN, BSN Entered By: Baruch Gouty on 09/08/2020 15:08:37 -------------------------------------------------------------------------------- Patient/Caregiver Education Details Patient Name: Date of Service: [name] [name], [name] [name] 6/20/2022andnbsp2:30 PM Medical Record Number: [id number] Patient Account Number: [id number] Date of Birth/Gender: Treating RN: February 15, 1966 (55 y.o. [name] Primary Care Physician: [name] Other Clinician: Referring Physician: Treating Physician/Extender: [name] in Treatment: 9 Education Assessment Education Provided To: Patient Education Topics Provided Elevated Blood Sugar/ Impact on Healing: Methods: Explain/Verbal Responses: Reinforcements needed, State content correctly Pressure: Methods: Explain/Verbal Responses: Reinforcements needed, State content correctly Wound/Skin Impairment: Methods: Explain/Verbal Responses: Reinforcements needed, State content correctly Electronic Signature(s) Signed: 09/08/2020 6:32:01 PM By: Baruch Gouty RN, BSN Entered By: Baruch Gouty on 09/08/2020 15:54:28 -------------------------------------------------------------------------------- Wound Assessment Details Patient Name: Date of Service: [name] Mocha, MA [name] L. 09/08/2020 2:30 PM Medical Record Number: [id number] Patient Account Number: [id number] Date of Birth/Sex: Treating RN: Sep 30, 1965 (55 y.o. [name] Primary Care Patrik Turnbaugh: [name] Other Clinician: Referring Malcolm Hetz: Treating Maryjane Benedict/Extender: [name] in Treatment: 9 Wound Status Wound Number: 2 Primary Diabetic Wound/Ulcer of the Lower Extremity Etiology: Wound Location: Right Calcaneus Wound Open Wounding Event: Pressure Injury Status: Date Acquired: 04/22/2020 Comorbid Anemia, Congestive Heart Failure, Hypertension, Type II Diabetes, Weeks Of Treatment: 9 History: End Stage Renal Disease, Raynauds, Neuropathy Clustered Wound: No Photos Wound Measurements Length: (cm) 7.5 Width: (cm) 7.7 Depth: (cm) 1.1 Area: (cm) 45.357 Volume: (cm) 49.892 % Reduction in Area: -327.8% % Reduction in Volume: -2252.3% Epithelialization: Small (1-33%) Tunneling: No Undermining: No Wound Description Classification: Grade 2 Wound Margin: Well defined, not attached Exudate Amount: Large Exudate Type: Purulent Exudate Color: yellow, brown, green Wound Bed Granulation Amount: Small (1-33%) Granulation Quality: Pink Necrotic Amount: Large (67-100%) Necrotic Quality: Eschar, Adherent Slough Foul Odor After Cleansing: Yes Due to Product Use: No Slough/Fibrino Yes Exposed Structure Fascia Exposed: No Fat Layer (Subcutaneous Tissue) Exposed: Yes Tendon Exposed: No Muscle Exposed: No Joint Exposed: No Bone Exposed: Yes Treatment Notes Wound #2 (Calcaneus) Wound Laterality: Right Cleanser Normal Saline Discharge Instruction: Cleanse the wound with Normal Saline prior to applying a clean dressing using gauze sponges, not tissue or cotton balls. Wound Cleanser Discharge Instruction: Cleanse the wound with wound cleanser or normal saline prior to applying a clean dressing using gauze sponges, not tissue or cotton balls. Peri-Wound Care Topical Primary Dressing KerraCel Ag Gelling Fiber Dressing, 4x5 in (silver alginate) Discharge Instruction: Apply silver alginate to wound bed as instructed Secondary Dressing Woven  Gauze Sponge, Non-Sterile 4x4 in Discharge Instruction: Apply over primary dressing as directed. ABD Pad, 8x10 Discharge Instruction: Apply over primary dressing as directed. Secured With [employer], 4.5x3.1 (in/yd) Discharge Instruction: Secure with Kerlix as directed. Paper Tape, 2x10 (in/yd) Discharge Instruction: Secure dressing with tape as directed. Compression Wrap Compression Stockings Add-Ons Electronic Signature(s) Signed: 09/08/2020 6:32:01 PM By: Baruch Gouty RN, BSN Signed: 09/11/2020 8:38:46 AM By: Leane Call Entered By: Leane Call on 09/08/2020 16:31:46 -------------------------------------------------------------------------------- Wound Assessment Details Patient Name: Date of Service: [name] Mocha, MA [name] 09/08/2020 2:30 PM Medical Record Number:  [id number] Patient Account Number: [id number] Date of Birth/Sex: Treating RN: 25-Jun-1965 (55 y.o. [name] Primary Care Cambreigh Dearing: [name] Other Clinician: Referring Laurelyn Terrero: Treating Symia Herdt/Extender: [name] in Treatment: 9 Wound Status Wound Number: 3 Primary Diabetic Wound/Ulcer of the Lower Extremity Etiology: Wound Location: Left Calcaneus Wound Open Wounding Event: Pressure Injury Status: Date Acquired: 04/22/2020 Comorbid Anemia, Congestive Heart Failure, Hypertension, Type II Diabetes, Weeks Of Treatment: 9 History: End Stage Renal Disease, Raynauds, Neuropathy Clustered Wound: No Photos Wound Measurements Length: (cm) 7.4 Width: (cm) 6.4 Depth: (cm) 1 Area: (cm) 37.196 Volume: (cm) 37.196 % Reduction in Area: -95.7% % Reduction in Volume: -117.4% Epithelialization: Small (1-33%) Tunneling: No Undermining: No Wound Description Classification: Grade 2 Wound Margin: Well defined, not attached Exudate Amount: Large Exudate Type: Serosanguineous Exudate Color: red, brown Foul Odor After Cleansing: Yes Due to Product  Use: No Slough/Fibrino Yes Wound Bed Granulation Amount: Small (1-33%) Exposed Structure Granulation Quality: Pink Fascia Exposed: No Necrotic Amount: Medium (34-66%) Fat Layer (Subcutaneous Tissue) Exposed: Yes Necrotic Quality: Eschar, Adherent Slough Tendon Exposed: No Muscle Exposed: No Joint Exposed: No Bone Exposed: No Treatment Notes Wound #3 (Calcaneus) Wound Laterality: Left Cleanser Normal Saline Discharge Instruction: Cleanse the wound with Normal Saline prior to applying a clean dressing using gauze sponges, not tissue or cotton balls. Wound Cleanser Discharge Instruction: Cleanse the wound with wound cleanser or normal saline prior to applying a clean dressing using gauze sponges, not tissue or cotton balls. Peri-Wound Care Topical Primary Dressing KerraCel Ag Gelling Fiber Dressing, 4x5 in (silver alginate) Discharge Instruction: Apply silver alginate to wound bed as instructed Secondary Dressing Woven Gauze Sponge, Non-Sterile 4x4 in Discharge Instruction: Apply over primary dressing as directed. ABD Pad, 8x10 Discharge Instruction: Apply over primary dressing as directed. Secured With [employer], 4.5x3.1 (in/yd) Discharge Instruction: Secure with Kerlix as directed. Paper Tape, 2x10 (in/yd) Discharge Instruction: Secure dressing with tape as directed. Compression Wrap Compression Stockings Add-Ons Electronic Signature(s) Signed: 09/08/2020 6:32:01 PM By: Baruch Gouty RN, BSN Signed: 09/11/2020 8:38:46 AM By: Leane Call Entered By: Leane Call on 09/08/2020 16:32:14 -------------------------------------------------------------------------------- Wound Assessment Details Patient Name: Date of Service: [name] Mocha, MA [name] 09/08/2020 2:30 PM Medical Record Number: [id number] Patient Account Number: [id number] Date of Birth/Sex: Treating RN: 01/27/1966 (55 y.o. [name] Primary Care Rona Tomson: [name] Other  Clinician: Referring Katalia Choma: Treating Elanah Osmanovic/Extender: [name] in Treatment: 9 Wound Status Wound Number: 5 Primary Pressure Ulcer Etiology: Wound Location: Left Gluteal fold Wound Open Wounding Event: Shear/Friction Status: Date Acquired: 08/22/2020 Comorbid Anemia, Congestive Heart Failure, Hypertension, Type II Diabetes, Weeks Of Treatment: 2 History: End Stage Renal Disease, Raynauds, Neuropathy Clustered Wound: Yes Photos Wound Measurements Length: (cm) 2 Width: (cm) 2.4 Depth: (cm) 0.1 Area: (cm) 3.77 Volume: (cm) 0.377 % Reduction in Area: -20% % Reduction in Volume: -20.1% Epithelialization: Medium (34-66%) Tunneling: No Undermining: No Wound Description Classification: Category/Stage II Wound Margin: Distinct, outline attached Exudate Amount: Medium Exudate Type: Serosanguineous Exudate Color: red, brown Foul Odor After Cleansing: No Slough/Fibrino No Wound Bed Granulation Amount: Large (67-100%) Exposed Structure Granulation Quality: Red, Pink Fascia Exposed: No Necrotic Amount: None Present (0%) Fat Layer (Subcutaneous Tissue) Exposed: Yes Tendon Exposed: No Muscle Exposed: No Joint Exposed: No Bone Exposed: No Treatment Notes Wound #5 (Gluteal fold) Wound Laterality: Left Cleanser Soap and Water Discharge Instruction: May shower and wash wound with dial antibacterial soap and water prior to dressing change. Peri-Wound Care Topical Primary Dressing KerraCel  Ag Gelling Fiber Dressing, 2x2 in (silver alginate) Discharge Instruction: Apply silver alginate to wound bed as instructed Secondary Dressing Bordered Gauze, 2x3.75 in Discharge Instruction: Apply over primary dressing as directed. Secured With Compression Wrap Compression Stockings Environmental education officer) Signed: 09/08/2020 6:32:01 PM By: Baruch Gouty RN, BSN Signed: 09/11/2020 8:38:46 AM By: Leane Call Entered By: Leane Call  on 09/08/2020 16:32:42 -------------------------------------------------------------------------------- Vitals Details Patient Name: Date of Service: [name] Mocha, MA [name] 09/08/2020 2:30 PM Medical Record Number: [id number] Patient Account Number: [id number] Date of Birth/Sex: Treating RN: 10/19/1965 (55 y.o. [name] Primary Care Nohelani Benning: [name] Other Clinician: Referring Chevon Laufer: Treating Ulyssa Walthour/Extender: [name] in Treatment: 9 Vital Signs Time Taken: 15:06 Temperature (F): 99.1 Height (in): 65 Pulse (bpm): 106 Source: Stated Respiratory Rate (breaths/min): 18 Weight (lbs): 240 Blood Pressure (mmHg): 113/57 Source: Stated Capillary Blood Glucose (mg/dl): 154 Body Mass Index (BMI): 39.9 Reference Range: 80 - 120 mg / dl Notes glucose per pt report this am Electronic Signature(s) Signed: 09/08/2020 6:32:01 PM By: Baruch Gouty RN, BSN Entered By: Baruch Gouty on 09/08/2020 15:07:19

## 2020-09-12 DIAGNOSIS — E1122 Type 2 diabetes mellitus with diabetic chronic kidney disease: Secondary | ICD-10-CM | POA: Diagnosis not present

## 2020-09-12 DIAGNOSIS — I120 Hypertensive chronic kidney disease with stage 5 chronic kidney disease or end stage renal disease: Secondary | ICD-10-CM | POA: Diagnosis not present

## 2020-09-12 DIAGNOSIS — L89616 Pressure-induced deep tissue damage of right heel: Secondary | ICD-10-CM | POA: Diagnosis not present

## 2020-09-12 DIAGNOSIS — N186 End stage renal disease: Secondary | ICD-10-CM | POA: Diagnosis not present

## 2020-09-12 DIAGNOSIS — L89620 Pressure ulcer of left heel, unstageable: Secondary | ICD-10-CM | POA: Diagnosis not present

## 2020-09-12 DIAGNOSIS — I7300 Raynaud's syndrome without gangrene: Secondary | ICD-10-CM | POA: Diagnosis not present

## 2020-09-13 DIAGNOSIS — N186 End stage renal disease: Secondary | ICD-10-CM | POA: Diagnosis not present

## 2020-09-13 DIAGNOSIS — N2581 Secondary hyperparathyroidism of renal origin: Secondary | ICD-10-CM | POA: Diagnosis not present

## 2020-09-13 DIAGNOSIS — D631 Anemia in chronic kidney disease: Secondary | ICD-10-CM | POA: Diagnosis not present

## 2020-09-15 ENCOUNTER — Other Ambulatory Visit: Payer: Self-pay

## 2020-09-15 ENCOUNTER — Encounter (HOSPITAL_BASED_OUTPATIENT_CLINIC_OR_DEPARTMENT_OTHER): Payer: Medicare Other | Admitting: Internal Medicine

## 2020-09-15 DIAGNOSIS — L89620 Pressure ulcer of left heel, unstageable: Secondary | ICD-10-CM

## 2020-09-15 DIAGNOSIS — E1122 Type 2 diabetes mellitus with diabetic chronic kidney disease: Secondary | ICD-10-CM | POA: Diagnosis not present

## 2020-09-15 DIAGNOSIS — L89619 Pressure ulcer of right heel, unspecified stage: Secondary | ICD-10-CM | POA: Diagnosis not present

## 2020-09-15 DIAGNOSIS — L89623 Pressure ulcer of left heel, stage 3: Secondary | ICD-10-CM | POA: Diagnosis not present

## 2020-09-15 DIAGNOSIS — L89152 Pressure ulcer of sacral region, stage 2: Secondary | ICD-10-CM

## 2020-09-15 DIAGNOSIS — I120 Hypertensive chronic kidney disease with stage 5 chronic kidney disease or end stage renal disease: Secondary | ICD-10-CM | POA: Diagnosis not present

## 2020-09-15 DIAGNOSIS — L89613 Pressure ulcer of right heel, stage 3: Secondary | ICD-10-CM

## 2020-09-15 DIAGNOSIS — L89616 Pressure-induced deep tissue damage of right heel: Secondary | ICD-10-CM | POA: Diagnosis not present

## 2020-09-15 DIAGNOSIS — I7300 Raynaud's syndrome without gangrene: Secondary | ICD-10-CM | POA: Diagnosis not present

## 2020-09-15 DIAGNOSIS — L97811 Non-pressure chronic ulcer of other part of right lower leg limited to breakdown of skin: Secondary | ICD-10-CM | POA: Diagnosis not present

## 2020-09-15 DIAGNOSIS — L89302 Pressure ulcer of unspecified buttock, stage 2: Secondary | ICD-10-CM | POA: Diagnosis not present

## 2020-09-15 DIAGNOSIS — L89102 Pressure ulcer of unspecified part of back, stage 2: Secondary | ICD-10-CM | POA: Diagnosis not present

## 2020-09-15 DIAGNOSIS — N186 End stage renal disease: Secondary | ICD-10-CM | POA: Diagnosis not present

## 2020-09-15 DIAGNOSIS — E11621 Type 2 diabetes mellitus with foot ulcer: Secondary | ICD-10-CM | POA: Diagnosis not present

## 2020-09-15 NOTE — Progress Notes (Addendum)
[name], [name] ([id number]) Visit Report for 09/15/2020 Chief Complaint Document Details Patient Name: Date of Service: [name] [name], [place] 09/15/2020 1:15 PM Medical Record Number: [id number] Patient Account Number: [id number] Date of Birth/Sex: Treating RN: [birth year] (55 y.o. [name] [name] Primary Care Provider: Vincente [name] Other Clinician: Referring Provider: Treating Provider/Extender: [name] [name] in Treatment: 10 Information Obtained from: Patient Chief Complaint Bilateral heel wounds, buttocks, sacral and back wounds Electronic Signature(s) Signed: 09/15/2020 3:14:33 PM By: [name] Shan DO Entered By: [name] [name] on 09/15/2020 15:05:12 -------------------------------------------------------------------------------- Debridement Details Patient Name: Date of Service: [name] [name], Camilla [place] L. 09/15/2020 1:15 PM Medical Record Number: [id number] Patient Account Number: [id number] Date of Birth/Sex: Treating RN: September 21, 1965 (55 y.o. [name] [name] Primary Care Provider: Vincente [name] Other Clinician: Referring Provider: Treating Provider/Extender: [name] [name] in Treatment: 10 Debridement Performed for Assessment: Wound #2 Right Calcaneus Performed By: Physician [name] Shan, DO Debridement Type: Debridement Severity of Tissue Pre Debridement: Fat layer exposed Level of Consciousness (Pre-procedure): Awake and Alert Pre-procedure Verification/Time Out Yes - 14:37 Taken: Start Time: 14:40 Pain Control: Lidocaine 5% topical ointment T Area Debrided (L x W): otal 8 (cm) x 7.1 (cm) = 56.8 (cm) Tissue and other material debrided: Non-Viable, Eschar, Slough, Subcutaneous, Slough Level: Skin/Subcutaneous Tissue Debridement Description: Excisional Instrument: Blade, Curette, Forceps, Scissors Bleeding: Minimum Hemostasis Achieved: Pressure End Time: 14:42 Response to Treatment: Procedure was  tolerated well Level of Consciousness (Post- Awake and Alert procedure): Post Debridement Measurements of Total Wound Length: (cm) 8 Width: (cm) 7.1 Depth: (cm) 1 Volume: (cm) 44.611 Character of Wound/Ulcer Post Debridement: Stable Severity of Tissue Post Debridement: Fat layer exposed Post Procedure Diagnosis Same as Pre-procedure Electronic Signature(s) Signed: 09/15/2020 3:14:33 PM By: [name] Shan DO Signed: 09/15/2020 5:08:20 PM By: [name] [name] Entered By: [name] [name] on 09/15/2020 14:45:29 -------------------------------------------------------------------------------- Debridement Details Patient Name: Date of Service: [name] Mocha, [name] [name] 09/15/2020 1:15 PM Medical Record Number: [id number] Patient Account Number: [id number] Date of Birth/Sex: Treating RN: 1965-09-21 (55 y.o. [name] [name] Primary Care Provider: Vincente [name] Other Clinician: Referring Provider: Treating Provider/Extender: [name] [name] in Treatment: 10 Debridement Performed for Assessment: Wound #3 Left Calcaneus Performed By: Physician [name] Shan, DO Debridement Type: Debridement Severity of Tissue Pre Debridement: Fat layer exposed Level of Consciousness (Pre-procedure): Awake and Alert Pre-procedure Verification/Time Out Yes - 14:37 Taken: Start Time: 14:38 Pain Control: Lidocaine 5% topical ointment T Area Debrided (L x W): otal 7.3 (cm) x 7 (cm) = 51.1 (cm) Tissue and other material debrided: Non-Viable, Eschar, Slough, Subcutaneous, Slough Level: Skin/Subcutaneous Tissue Debridement Description: Excisional Instrument: Blade, Curette, Forceps, Scissors Bleeding: Minimum Hemostasis Achieved: Pressure End Time: 14:40 Response to Treatment: Procedure was tolerated well Level of Consciousness (Post- Awake and Alert procedure): Post Debridement Measurements of Total Wound Length: (cm) 7.3 Width: (cm) 7 Depth: (cm) 0.8 Volume: (cm)  32.107 Character of Wound/Ulcer Post Debridement: Stable Severity of Tissue Post Debridement: Fat layer exposed Post Procedure Diagnosis Same as Pre-procedure Electronic Signature(s) Signed: 09/15/2020 3:14:33 PM By: [name] Shan DO Signed: 09/15/2020 5:08:20 PM By: [name] [name] Entered By: [name] [name] on 09/15/2020 14:45:48 -------------------------------------------------------------------------------- HPI Details Patient Name: Date of Service: [name] Mocha, [name] [name] 09/15/2020 1:15 PM Medical Record Number: [id number] Patient Account Number: [id number] Date of Birth/Sex: Treating RN: 28-Jan-1966 (55 y.o. [name] [name] Primary Care Provider: Vincente [name] Other Clinician: Referring Provider: Treating Provider/Extender: [name] [name] in Treatment: 10 History of Present Illness  HPI Description: 4/13 - [name] [name] is a 55 year old male with past medical history of insulin-dependent type 2 diabetes, end-stage renal disease on hemodialysis, Raynaud's disease with bilateral hand and fingertip ulcers and essential hypertension that presents with bilateral heel ulcers. This has been an ongoing issue for the patient and has been following closely with podiatry. He had debridement and graft application preformed on 06/04/2020 due to worsening of the wounds. Imaging suggested osteo and he was treated with vancomycin and amoxicillin. Today he presents for further evaluation of the wounds. He is currently using Santyl on these areas daily. He uses a cam boot on the left and a surgical shoe on the right to help with offloading. He denies any acute pain, fever/chills, increased warmth or erythema to the feet or purulent drainage. 5/9; unfortunately patient has not followed up to his regular appointments. Its been almost a month since I last saw him. He has been using Santyl daily to the wound beds. He states that he sits a lot in his chair with with his feet on  the ground. He reports walking around a lot in the day doing his activities of daily living. He denies acute signs of infection. 5/23; patient presents for his 2-week follow-up. He has been using Santyl daily to the wounds and has been trying to offload his heels throughout the day. He states he has to sleep in a wheelchair and occasionally is able to elevate his legs. He denies acute signs of infection. 6/1; patient presents for 1 week follow-up. He has been using Santyl to the wounds however only has a little left and refills are not until 6/14. He was discharged as well by home health for unknown reasons. He continues to sleep in a wheelchair and cannot offload his heels. He states he is receiving a hospital bed today. He denies acute signs of infection. 6/6; patient presents for 1 week follow-up. He had just enough Santyl to cover to yesterday but does not have a refill until 6/14. He did not receive the supplies from home health. Despite having a hospital bed he continues to sleep in a wheelchair and cannot offload his heels. He denies acute signs of infection. He also developed a small wound to the area between his posterior thigh and buttocks. He has been placing a Band-Aid on this. 6/13; this is a patient with severe bilateral plantar heel wounds. He has been using Santyl and Hydrofera Blue. Followed for a prolonged period of time by podiatry. He received operative debridement and bone biopsy in February. He does not describe himself is that frequent and ambulator. He has bilateral heel offloading shoes During the hospitalization in February he saw Dr. Donnetta Hutching of vein and vascular he noted the atypical location of these wounds and noted that the noninvasive arterial studies revealed noncompressible vessels bilaterally but the toe brachial indexes on the left were normal and slightly diminished on the right. MRI showed no abscesses The patient is a type 2 diabetes on dialysis. 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 He was switched to silver alginate at last clinic visit. Overall his wounds continue to slightly worsen. He cannot relieve the pressure off his heels and states he continues to sleep in his wheelchair due to back pain. 6/27; patient presents for 1 week follow-up. He now has developed for new wounds located to his back, sacrum and buttocks. He is not obtain  the MRIs of his bilateral heels. We will give him the number to call back. He reports stability in his wounds. He continues to sleep for most of the time in his wheelchair and sits in the chair for at least 5 hours in the daytime. He currently denies signs of infection. Electronic Signature(s) Signed: 09/15/2020 3:14:33 PM By: [name] Shan DO Entered By: [name] [name] on 09/15/2020 15:06:45 -------------------------------------------------------------------------------- Physical Exam Details Patient Name: Date of Service: [name] Mocha, [name] [place] L. 09/15/2020 1:15 PM Medical Record Number: [id number] Patient Account Number: [id number] Date of Birth/Sex: Treating RN: 11/28/1965 (55 y.o. [name] [name] Primary Care Provider: Vincente [name] Other Clinician: Referring Provider: Treating Provider/Extender: [name] [name] in Treatment: 10 Constitutional respirations regular, non-labored and within target range for patient.. Cardiovascular 2+ dorsalis pedis/posterior tibialis pulses. Psychiatric pleasant and cooperative. Notes Left and right calcaneus with nonviable surface that is very close to bone. His left buttocks, sacral region, back and left upper thigh have wounds limited to skin breakdown. No obvious signs of infection. Electronic Signature(s) Signed: 09/15/2020 3:14:33 PM By: [name] Shan DO Entered By: [name] [name] on 09/15/2020  15:07:41 -------------------------------------------------------------------------------- Physician Orders Details Patient Name: Date of Service: [name] Mocha, [name] [place] L. 09/15/2020 1:15 PM Medical Record Number: [id number] Patient Account Number: [id number] Date of Birth/Sex: Treating RN: 08-Dec-1965 (55 y.o. [name] [name] Primary Care Provider: Vincente [name] Other Clinician: Referring Provider: Treating Provider/Extender: [name] [name] in Treatment: 10 Verbal / Phone Orders: No Diagnosis Coding ICD-10 Coding Code Description [id number] Pressure ulcer of right heel, unspecified stage L89.620 Pressure ulcer of left heel, unstageable E11.621 Type 2 diabetes mellitus with foot ulcer N18.6 End stage renal disease L97.101 Non-pressure chronic ulcer of unspecified thigh limited to breakdown of skin L89.302 Pressure ulcer of unspecified buttock, stage 2 L89.152 Pressure ulcer of sacral region, stage 2 L89.102 Pressure ulcer of unspecified part of back, stage 2 Follow-up Appointments ppointment in 1 week. - with Dr. Heber [place] Return A Bathing/ Shower/ Hygiene May shower and wash wound with soap and water. Edema Control - Lymphedema / SCD / Other Elevate legs to the level of the heart or above for 30 minutes daily and/or when sitting, a frequency of: - throughout the day Avoid standing for long periods of time. Exercise regularly Moisturize legs daily. Off-Loading Open toe surgical shoe to: - both feet Turn and reposition every 2 hours Additional Orders / Instructions Follow Nutritious Diet Home Health No change in wound care orders this week; continue Home Health for wound care. May utilize formulary equivalent dressing for wound treatment orders unless otherwise specified. New wound care orders this week; continue Home Health for wound care. May utilize formulary equivalent dressing for wound treatment orders unless otherwise specified. Other Home  Health Orders/Instructions: - [facility] Wound Treatment Wound #2 - Calcaneus Wound Laterality: Right Cleanser: Normal Saline (Generic) 1 x Per Day/30 Days Discharge Instructions: Cleanse the wound with Normal Saline prior to applying a clean dressing using gauze sponges, not tissue or cotton balls. Cleanser: Wound Cleanser (Generic) 1 x Per Day/30 Days Discharge Instructions: Cleanse the wound with wound cleanser or normal saline prior to applying a clean dressing using gauze sponges, not tissue or cotton balls. Prim Dressing: Santyl Ointment 1 x Per Day/30 Days ary Discharge Instructions: Apply nickel thick amount to wound bed as instructed Secondary Dressing: Woven Gauze Sponge, Non-Sterile 4x4 in (Generic) 1 x Per Day/30 Days Discharge Instructions: Apply over primary dressing as directed. Secondary Dressing: ABD Pad, 8x10  1 x Per Day/30 Days Discharge Instructions: Apply over primary dressing as directed. Secured With: [employer], 4.5x3.1 (in/yd) (Generic) 1 x Per Day/30 Days Discharge Instructions: Secure with Kerlix as directed. Secured With: Paper Tape, 2x10 (in/yd) (Generic) 1 x Per Day/30 Days Discharge Instructions: Secure dressing with tape as directed. Wound #3 - Calcaneus Wound Laterality: Left Cleanser: Normal Saline (Generic) 1 x Per Day/30 Days Discharge Instructions: Cleanse the wound with Normal Saline prior to applying a clean dressing using gauze sponges, not tissue or cotton balls. Cleanser: Wound Cleanser (Generic) 1 x Per Day/30 Days Discharge Instructions: Cleanse the wound with wound cleanser or normal saline prior to applying a clean dressing using gauze sponges, not tissue or cotton balls. Prim Dressing: Santyl Ointment 1 x Per Day/30 Days ary Discharge Instructions: Apply nickel thick amount to wound bed as instructed Secondary Dressing: Woven Gauze Sponge, Non-Sterile 4x4 in (Generic) 1 x Per Day/30 Days Discharge Instructions: Apply over primary  dressing as directed. Secondary Dressing: ABD Pad, 8x10 1 x Per Day/30 Days Discharge Instructions: Apply over primary dressing as directed. Secured With: [employer], 4.5x3.1 (in/yd) (Generic) 1 x Per Day/30 Days Discharge Instructions: Secure with Kerlix as directed. Secured With: Paper Tape, 2x10 (in/yd) (Generic) 1 x Per Day/30 Days Discharge Instructions: Secure dressing with tape as directed. Wound #5 - Gluteal fold Wound Laterality: Left Cleanser: Soap and Water 1 x Per Day/30 Days Discharge Instructions: May shower and wash wound with dial antibacterial soap and water prior to dressing change. Topical: Antibiotic Ointment 1 x Per Day/30 Days Secondary Dressing: Bordered Gauze, 2x3.75 in 1 x Per Day/30 Days Discharge Instructions: Apply over primary dressing as directed. Wound #6 - Sacrum Cleanser: Soap and Water 1 x Per Day/30 Days Discharge Instructions: May shower and wash wound with dial antibacterial soap and water prior to dressing change. Topical: Antibiotic Ointment 1 x Per Day/30 Days Secondary Dressing: Bordered Gauze, 2x3.75 in 1 x Per Day/30 Days Discharge Instructions: Apply over primary dressing as directed. Wound #7 - Gluteus Wound Laterality: Left Cleanser: Soap and Water 1 x Per Day/30 Days Discharge Instructions: May shower and wash wound with dial antibacterial soap and water prior to dressing change. Topical: Antibiotic Ointment 1 x Per Day/30 Days Secondary Dressing: Bordered Gauze, 2x3.75 in 1 x Per Day/30 Days Discharge Instructions: Apply over primary dressing as directed. Wound #8 - Back Wound Laterality: Right, Distal Cleanser: Soap and Water 1 x Per Day/30 Days Discharge Instructions: May shower and wash wound with dial antibacterial soap and water prior to dressing change. Topical: Antibiotic Ointment 1 x Per Day/30 Days Secondary Dressing: Bordered Gauze, 2x3.75 in 1 x Per Day/30 Days Discharge Instructions: Apply over primary dressing as  directed. Patient Medications llergies: No Known Drug Allergies A Notifications Medication Indication Start End 09/15/2020 Santyl DOSE 1 - topical 250 unit/gram ointment - 1 application daily Electronic Signature(s) Signed: 09/15/2020 3:12:43 PM By: [name] Shan DO Entered By: [name] [name] on 09/15/2020 15:12:42 -------------------------------------------------------------------------------- Problem List Details Patient Name: Date of Service: [name] Mocha, [name] [place] L. 09/15/2020 1:15 PM Medical Record Number: [id number] Patient Account Number: [id number] Date of Birth/Sex: Treating RN: 1966/01/26 (55 y.o. [name] [name] Primary Care Provider: Vincente [name] Other Clinician: Referring Provider: Treating Provider/Extender: [name] [name] in Treatment: 10 Active Problems ICD-10 Encounter Code Description Active Date MDM Diagnosis L89.619 Pressure ulcer of right heel, unspecified stage 07/02/2020 No Yes L89.620 Pressure ulcer of left heel, unstageable 07/02/2020 No Yes E11.621 Type 2 diabetes  mellitus with foot ulcer 07/02/2020 No Yes N18.6 End stage renal disease 07/02/2020 No Yes L97.101 Non-pressure chronic ulcer of unspecified thigh limited to breakdown of skin 08/25/2020 No Yes L89.302 Pressure ulcer of unspecified buttock, stage 2 09/15/2020 No Yes L89.152 Pressure ulcer of sacral region, stage 2 09/15/2020 No Yes L89.102 Pressure ulcer of unspecified part of back, stage 2 09/15/2020 No Yes Inactive Problems Resolved Problems ICD-10 Code Description Active Date Resolved Date S00.411A Abrasion of right ear, initial encounter 08/11/2020 08/11/2020 Electronic Signature(s) Signed: 09/15/2020 3:14:33 PM By: [name] Shan DO Previous Signature: 09/15/2020 1:39:41 PM Version By: [name] [name] Entered By: [name] [name] on 09/15/2020 15:04:24 -------------------------------------------------------------------------------- Progress Note  Details Patient Name: Date of Service: [name] Mocha, [name] [place] L. 09/15/2020 1:15 PM Medical Record Number: [id number] Patient Account Number: [id number] Date of Birth/Sex: Treating RN: 10-Jun-1965 (55 y.o. [name] [name] Primary Care Provider: Vincente [name] Other Clinician: Referring Provider: Treating Provider/Extender: [name] [name] in Treatment: 10 Subjective Chief Complaint Information obtained from Patient Bilateral heel wounds, buttocks, sacral and back wounds History of Present Illness (HPI) 4/13 - Tyland Reagle is a 55 year old male with past medical history of insulin-dependent type 2 diabetes, end-stage renal disease on hemodialysis, Raynaud's disease with bilateral hand and fingertip ulcers and essential hypertension that presents with bilateral heel ulcers. This has been an ongoing issue for the patient and has been following closely with podiatry. He had debridement and graft application preformed on 06/04/2020 due to worsening of the wounds. Imaging suggested osteo and he was treated with vancomycin and amoxicillin. Today he presents for further evaluation of the wounds. He is currently using Santyl on these areas daily. He uses a cam boot on the left and a surgical shoe on the right to help with offloading. He denies any acute pain, fever/chills, increased warmth or erythema to the feet or purulent drainage. 5/9; unfortunately patient has not followed up to his regular appointments. Its been almost a month since I last saw him. He has been using Santyl daily to the wound beds. He states that he sits a lot in his chair with with his feet on the ground. He reports walking around a lot in the day doing his activities of daily living. He denies acute signs of infection. 5/23; patient presents for his 2-week follow-up. He has been using Santyl daily to the wounds and has been trying to offload his heels throughout the day. He states he has to sleep in a  wheelchair and occasionally is able to elevate his legs. He denies acute signs of infection. 6/1; patient presents for 1 week follow-up. He has been using Santyl to the wounds however only has a little left and refills are not until 6/14. He was discharged as well by home health for unknown reasons. He continues to sleep in a wheelchair and cannot offload his heels. He states he is receiving a hospital bed today. He denies acute signs of infection. 6/6; patient presents for 1 week follow-up. He had just enough Santyl to cover to yesterday but does not have a refill until 6/14. He did not receive the supplies from home health. Despite having a hospital bed he continues to sleep in a wheelchair and cannot offload his heels. He denies acute signs of infection. He also developed a small wound to the area between his posterior thigh and buttocks. He has been placing a Band-Aid on this. 6/13; this is a patient with severe bilateral plantar heel wounds. He has been using  Santyl and Hydrofera Blue. Followed for a prolonged period of time by podiatry. He received operative debridement and bone biopsy in February. He does not describe himself is that frequent and ambulator. He has bilateral heel offloading shoes During the hospitalization in February he saw Dr. Donnetta Hutching of vein and vascular he noted the atypical location of these wounds and noted that the noninvasive arterial studies revealed noncompressible vessels bilaterally but the toe brachial indexes on the left were normal and slightly diminished on the right. MRI showed no abscesses The patient is a type 2 diabetes on dialysis. 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 He was switched to silver alginate at last clinic visit. Overall his wounds continue to slightly worsen. He cannot relieve the pressure off his heels and states he continues to sleep in his wheelchair  due to back pain. 6/27; patient presents for 1 week follow-up. He now has developed for new wounds located to his back, sacrum and buttocks. He is not obtain the MRIs of his bilateral heels. We will give him the number to call back. He reports stability in his wounds. He continues to sleep for most of the time in his wheelchair and sits in the chair for at least 5 hours in the daytime. He currently denies signs of infection. Patient History Information obtained from Patient. Family History Unknown History. Social History Never smoker, Marital Status - Single, Alcohol Use - Rarely, Drug Use - No History, Caffeine Use - Rarely. 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 Vitals Time Taken: 1:16 PM, Height: 65 in, Weight: 240 lbs, BMI: 39.9, Temperature: 98.9 F, Pulse: 111 bpm, Respiratory Rate: 20 breaths/min, Blood Pressure: 97/50 mmHg. Cardiovascular 2+ dorsalis pedis/posterior tibialis pulses. Psychiatric pleasant and cooperative. General Notes: Left and right calcaneus with nonviable surface that is very close to bone. His left buttocks, sacral region, back and left upper thigh have wounds limited to skin breakdown. No obvious signs of infection. Integumentary (Hair, Skin) Wound #2 status is Open. Original cause of wound was Pressure Injury. The date acquired was: 04/22/2020. The wound has been in treatment 10  weeks. The wound is located on the Right Calcaneus. The wound measures 8cm length x 7.1cm width x 1cm depth; 44.611cm^2 area and 44.611cm^3 volume. There is bone and Fat Layer (Subcutaneous Tissue) exposed. There is no tunneling or undermining noted. There is a large amount of purulent drainage noted. Foul odor after cleansing was noted. The wound margin is well defined and not attached to the wound base. There is small (1-33%) pink granulation within the wound bed. There is a large (67-100%) amount of necrotic tissue within the wound bed including Eschar and Adherent Slough. Wound #3 status is Open. Original cause of wound was Pressure Injury. The date acquired was: 04/22/2020. The wound has been in treatment 10 weeks. The wound is located on the Left Calcaneus. The wound measures 7.3cm length x 7cm width x 0.8cm depth; 40.134cm^2 area and 32.107cm^3 volume. There is Fat Layer (Subcutaneous Tissue) exposed. There is  no tunneling or undermining noted. There is a large amount of serosanguineous drainage noted. Foul odor after cleansing was noted. The wound margin is well defined and not attached to the wound base. There is no granulation within the wound bed. There is a medium (34-66%) amount of necrotic tissue within the wound bed including Eschar and Adherent Slough. Wound #5 status is Open. Original cause of wound was Shear/Friction. The date acquired was: 08/22/2020. The wound has been in treatment 3 weeks. The wound is located on the Left Gluteal fold. The wound measures 0.3cm length x 0.3cm width x 0.1cm depth; 0.071cm^2 area and 0.007cm^3 volume. There is Fat Layer (Subcutaneous Tissue) exposed. There is no tunneling or undermining noted. There is a medium amount of serosanguineous drainage noted. The wound margin is distinct with the outline attached to the wound base. There is large (67-100%) red, pink granulation within the wound bed. There is no necrotic tissue within the wound bed. Wound #6  status is Open. Original cause of wound was Gradually Appeared. The date acquired was: 09/15/2020. The wound is located on the Sacrum. The wound measures 1cm length x 0.4cm width x 0.1cm depth; 0.314cm^2 area and 0.031cm^3 volume. There is Fat Layer (Subcutaneous Tissue) exposed. There is no tunneling or undermining noted. There is a medium amount of serosanguineous drainage noted. The wound margin is indistinct and nonvisible. There is small (1-33%) red, pink granulation within the wound bed. There is a small (1-33%) amount of necrotic tissue within the wound bed including Adherent Slough. Wound #7 status is Open. Original cause of wound was Gradually Appeared. The date acquired was: 09/15/2020. The wound is located on the Left Gluteus. The wound measures 0.6cm length x 0.7cm width x 0.1cm depth; 0.33cm^2 area and 0.033cm^3 volume. There is Fat Layer (Subcutaneous Tissue) exposed. There is no tunneling or undermining noted. There is a medium amount of serosanguineous drainage noted. The wound margin is distinct with the outline attached to the wound base. There is no granulation within the wound bed. There is a large (67-100%) amount of necrotic tissue within the wound bed including Adherent Slough. Wound #8 status is Open. Original cause of wound was Pressure Injury. The date acquired was: 09/15/2020. The wound is located on the Right,Distal Back. The wound measures 1.1cm length x 1.1cm width x 0.1cm depth; 0.95cm^2 area and 0.095cm^3 volume. There is Fat Layer (Subcutaneous Tissue) exposed. There is no tunneling or undermining noted. There is a medium amount of serosanguineous drainage noted. The wound margin is distinct with the outline attached to the wound base. There is no granulation within the wound bed. There is a large (67-100%) amount of necrotic tissue within the wound bed. 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 He has not obtained the MRIs of his heels. We will give him the number to call back to get these rescheduled. I was able to debride a lot of necrotic tissue today. He has now developed other wounds scattered throughout his back sacrum and buttocks. These are all pressure related and is limited to skin breakdown. I recommended using antibiotic ointment to these areas. We had a long discussion  about aggressive offloading to his heels and now back region. This is a tough case as Mr. Kitsmiller has very bad back pain that limits his ability to not use a wheelchair. If the MRIs do not show osteo then I will wrapped these legs and heels and put extra padding along with Hydrofera Blue and Santyl. I currently recommended he use Santyl daily to the heel wounds. Procedures Wound #2 Pre-procedure diagnosis of Wound #2 is a Diabetic Wound/Ulcer of the Lower Extremity located on the Right Calcaneus .Severity of Tissue Pre Debridement is: Fat layer exposed. There was a Excisional Skin/Subcutaneous Tissue Debridement with a total area of 56.8 sq cm performed by [name] Shan, DO. With the following instrument(s): Blade, Curette, Forceps, and Scissors to remove Non-Viable tissue/material. Material removed includes Eschar, Subcutaneous Tissue, and Slough after achieving pain control using Lidocaine 5% topical ointment. No specimens were taken. A time out was conducted at 14:37, prior to the start of the procedure. A Minimum amount of bleeding was controlled with Pressure. The procedure was tolerated well. Post Debridement Measurements: 8cm length x 7.1cm width x 1cm depth; 44.611cm^3  volume. Character of Wound/Ulcer Post Debridement is stable. Severity of Tissue Post Debridement is: Fat layer exposed. Post procedure Diagnosis Wound #2: Same as Pre-Procedure Wound #3 Pre-procedure diagnosis of Wound #3 is a Diabetic Wound/Ulcer of the Lower Extremity located on the Left Calcaneus .Severity of Tissue Pre Debridement is: Fat layer exposed. There was a Excisional Skin/Subcutaneous Tissue Debridement with a total area of 51.1 sq cm performed by [name] Shan, DO. With the following instrument(s): Blade, Curette, Forceps, and Scissors to remove Non-Viable tissue/material. Material removed includes Eschar, Subcutaneous Tissue, and Slough after achieving pain control using Lidocaine 5% topical ointment. No specimens were taken. A time out was conducted at 14:37, prior to the start of the procedure. A Minimum amount of bleeding was controlled with Pressure. The procedure was tolerated well. Post Debridement Measurements: 7.3cm length x 7cm width x 0.8cm depth; 32.107cm^3 volume. Character of Wound/Ulcer Post Debridement is stable. Severity of Tissue Post Debridement is: Fat layer exposed. Post procedure Diagnosis Wound #3: Same as Pre-Procedure Plan Follow-up Appointments: Return Appointment in 1 week. - with Dr. Heber [place] Bathing/ Shower/ Hygiene: May shower and wash wound with soap and water. Edema Control - Lymphedema / SCD / Other: Elevate legs to the level of the heart or above for 30 minutes daily and/or when sitting, a frequency of: - throughout the day Avoid standing for long periods of time. Exercise regularly Moisturize legs daily. Off-Loading: Open toe surgical shoe to: - both feet Turn and reposition every 2 hours Additional Orders / Instructions: Follow Nutritious Diet Home Health: No change in wound care orders this week; continue Home Health for wound care. May utilize formulary equivalent dressing for wound treatment orders unless otherwise specified. New  wound care orders this week; continue Home Health for wound care. May utilize formulary equivalent dressing for wound treatment orders unless otherwise specified. Other Home Health Orders/Instructions: - [facility] The following medication(s) was prescribed: Santyl topical 250 unit/gram ointment 1 1 application daily starting 09/15/2020 WOUND #2: - Calcaneus Wound Laterality: Right Cleanser: Normal Saline (Generic) 1 x Per Day/30 Days Discharge Instructions: Cleanse the wound with Normal Saline prior to applying a clean dressing using gauze sponges, not tissue or cotton balls. Cleanser: Wound Cleanser (Generic) 1 x Per Day/30 Days Discharge Instructions: Cleanse the wound with wound cleanser or normal saline prior to applying a clean dressing using gauze sponges, not tissue  or cotton balls. Prim Dressing: Santyl Ointment 1 x Per Day/30 Days ary Discharge Instructions: Apply nickel thick amount to wound bed as instructed Secondary Dressing: Woven Gauze Sponge, Non-Sterile 4x4 in (Generic) 1 x Per Day/30 Days Discharge Instructions: Apply over primary dressing as directed. Secondary Dressing: ABD Pad, 8x10 1 x Per Day/30 Days Discharge Instructions: Apply over primary dressing as directed. Secured With: [employer], 4.5x3.1 (in/yd) (Generic) 1 x Per Day/30 Days Discharge Instructions: Secure with Kerlix as directed. Secured With: Paper T ape, 2x10 (in/yd) (Generic) 1 x Per Day/30 Days Discharge Instructions: Secure dressing with tape as directed. WOUND #3: - Calcaneus Wound Laterality: Left Cleanser: Normal Saline (Generic) 1 x Per Day/30 Days Discharge Instructions: Cleanse the wound with Normal Saline prior to applying a clean dressing using gauze sponges, not tissue or cotton balls. Cleanser: Wound Cleanser (Generic) 1 x Per Day/30 Days Discharge Instructions: Cleanse the wound with wound cleanser or normal saline prior to applying a clean dressing using gauze sponges, not tissue or  cotton balls. Prim Dressing: Santyl Ointment 1 x Per Day/30 Days ary Discharge Instructions: Apply nickel thick amount to wound bed as instructed Secondary Dressing: Woven Gauze Sponge, Non-Sterile 4x4 in (Generic) 1 x Per Day/30 Days Discharge Instructions: Apply over primary dressing as directed. Secondary Dressing: ABD Pad, 8x10 1 x Per Day/30 Days Discharge Instructions: Apply over primary dressing as directed. Secured With: [employer], 4.5x3.1 (in/yd) (Generic) 1 x Per Day/30 Days Discharge Instructions: Secure with Kerlix as directed. Secured With: Paper T ape, 2x10 (in/yd) (Generic) 1 x Per Day/30 Days Discharge Instructions: Secure dressing with tape as directed. WOUND #5: - Gluteal fold Wound Laterality: Left Cleanser: Soap and Water 1 x Per Day/30 Days Discharge Instructions: May shower and wash wound with dial antibacterial soap and water prior to dressing change. Topical: Antibiotic Ointment 1 x Per Day/30 Days Secondary Dressing: Bordered Gauze, 2x3.75 in 1 x Per Day/30 Days Discharge Instructions: Apply over primary dressing as directed. WOUND #6: - Sacrum Wound Laterality: Cleanser: Soap and Water 1 x Per Day/30 Days Discharge Instructions: May shower and wash wound with dial antibacterial soap and water prior to dressing change. Topical: Antibiotic Ointment 1 x Per Day/30 Days Secondary Dressing: Bordered Gauze, 2x3.75 in 1 x Per Day/30 Days Discharge Instructions: Apply over primary dressing as directed. WOUND #7: - Gluteus Wound Laterality: Left Cleanser: Soap and Water 1 x Per Day/30 Days Discharge Instructions: May shower and wash wound with dial antibacterial soap and water prior to dressing change. Topical: Antibiotic Ointment 1 x Per Day/30 Days Secondary Dressing: Bordered Gauze, 2x3.75 in 1 x Per Day/30 Days Discharge Instructions: Apply over primary dressing as directed. WOUND #8: - Back Wound Laterality: Right, Distal Cleanser: Soap and Water 1 x Per  Day/30 Days Discharge Instructions: May shower and wash wound with dial antibacterial soap and water prior to dressing change. Topical: Antibiotic Ointment 1 x Per Day/30 Days Secondary Dressing: Bordered Gauze, 2x3.75 in 1 x Per Day/30 Days Discharge Instructions: Apply over primary dressing as directed. 1. In office sharp debridement 2. Santyl to the heels bilaterally 3. Antibiotic ointment to the areas of skin breakdown to his back, sacrum and buttocks 4. Follow-up in 1 week Electronic Signature(s) Signed: 09/15/2020 3:14:33 PM By: [name] Shan DO Entered By: [name] [name] on 09/15/2020 15:12:58 -------------------------------------------------------------------------------- HxROS Details Patient Name: Date of Service: [name] [name], [place] L. 09/15/2020 1:15 PM Medical Record Number: [id number] Patient Account Number: [id number] Date of Birth/Sex: Treating RN: 1965-11-19 (  55 y.o. [name] [name] Primary Care Provider: Vincente [name] Other Clinician: Referring Provider: Treating Provider/Extender: [name] [name] in Treatment: 10 Information Obtained From Patient Hematologic/Lymphatic Medical History: Positive for: Anemia Cardiovascular Medical History: Positive for: Congestive Heart Failure; Hypertension Past Medical History Notes: Cardiomyopathy, arterial clots Endocrine Medical History: Positive for: Type II Diabetes Treated with: Insulin Blood sugar tested every day: Yes Tested : 2-3x a day Genitourinary Medical History: Positive for: End Stage Renal Disease Past Medical History Notes: Hemodiaylsis Immunological Medical History: Positive for: Raynauds Past Medical History Notes: 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 Neurologic Medical History: Positive for: Neuropathy Immunizations Pneumococcal Vaccine: Received Pneumococcal Vaccination: Yes Implantable Devices None Family and Social History Unknown History:  Yes; Never smoker; Marital Status - Single; Alcohol Use: Rarely; Drug Use: No History; Caffeine Use: Rarely; Financial Concerns: No; Food, Clothing or Shelter Needs: No; Support System Lacking: No; Transportation Concerns: No Electronic Signature(s) Signed: 09/15/2020 3:14:33 PM By: [name] Shan DO Signed: 09/15/2020 5:08:20 PM By: [name] [name] Entered By: [name] [name] on 09/15/2020 15:06:55 -------------------------------------------------------------------------------- SuperBill Details Patient Name: Date of Service: [name] [name], [place] L. 09/15/2020 Medical Record Number: [id number] Patient Account Number: [id number] Date of Birth/Sex: Treating RN: 1965/11/28 (55 y.o. 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 L89.302 Pressure ulcer of unspecified buttock, stage 2 L89.152 Pressure ulcer of sacral region, stage 2 L89.102 Pressure ulcer of unspecified part of back, stage 2 Facility Procedures CPT4 Code: [id number] Description: [place] - DEB SUBQ TISSUE 20 SQ CM/< ICD-10 Diagnosis Description L89.619 Pressure ulcer of right heel, unspecified stage L89.620 Pressure ulcer of left heel, unstageable Modifier: Quantity: 1 CPT4 Code: [id number] Description: 11045 - DEB SUBQ TISS EA ADDL 20CM ICD-10 Diagnosis Description L89.619 Pressure ulcer of right heel, unspecified stage L89.620 Pressure ulcer of left heel, unstageable Modifier: Quantity: 5 Physician Procedures : CPT4 Code Description Modifier [id number] - WC PHYS LEVEL 3 - EST  PT ICD-10 Diagnosis Description L89.102 Pressure ulcer of unspecified part of back, stage 2 L89.152 Pressure ulcer of sacral region, stage 2 L89.302 Pressure ulcer of unspecified  buttock, stage 2 Quantity: 1 : [id number] - WC PHYS SUBQ TISS 20 SQ CM ICD-10 Diagnosis Description L89.619 Pressure ulcer of right heel, unspecified stage L89.620 Pressure ulcer of left heel, unstageable Quantity: 1 : [id number] - WC PHYS SUBQ TISS EA ADDL 20 CM ICD-10 Diagnosis Description L89.619 Pressure ulcer of right heel, unspecified stage L89.620 Pressure ulcer of left heel, unstageable Quantity: 5 Electronic Signature(s) Signed: 09/15/2020 3:14:33 PM By: [name] Shan DO Entered By: [name] [name] on 09/15/2020 15:13:35

## 2020-09-16 DIAGNOSIS — N186 End stage renal disease: Secondary | ICD-10-CM | POA: Diagnosis not present

## 2020-09-16 DIAGNOSIS — D631 Anemia in chronic kidney disease: Secondary | ICD-10-CM | POA: Diagnosis not present

## 2020-09-16 DIAGNOSIS — N2581 Secondary hyperparathyroidism of renal origin: Secondary | ICD-10-CM | POA: Diagnosis not present

## 2020-09-17 DIAGNOSIS — E1122 Type 2 diabetes mellitus with diabetic chronic kidney disease: Secondary | ICD-10-CM | POA: Diagnosis not present

## 2020-09-17 DIAGNOSIS — N186 End stage renal disease: Secondary | ICD-10-CM | POA: Diagnosis not present

## 2020-09-17 DIAGNOSIS — R188 Other ascites: Secondary | ICD-10-CM | POA: Diagnosis not present

## 2020-09-17 DIAGNOSIS — L89616 Pressure-induced deep tissue damage of right heel: Secondary | ICD-10-CM | POA: Diagnosis not present

## 2020-09-17 DIAGNOSIS — L89620 Pressure ulcer of left heel, unstageable: Secondary | ICD-10-CM | POA: Diagnosis not present

## 2020-09-17 DIAGNOSIS — I7300 Raynaud's syndrome without gangrene: Secondary | ICD-10-CM | POA: Diagnosis not present

## 2020-09-17 DIAGNOSIS — I120 Hypertensive chronic kidney disease with stage 5 chronic kidney disease or end stage renal disease: Secondary | ICD-10-CM | POA: Diagnosis not present

## 2020-09-18 DIAGNOSIS — D631 Anemia in chronic kidney disease: Secondary | ICD-10-CM | POA: Diagnosis not present

## 2020-09-18 DIAGNOSIS — N186 End stage renal disease: Secondary | ICD-10-CM | POA: Diagnosis not present

## 2020-09-18 DIAGNOSIS — Z992 Dependence on renal dialysis: Secondary | ICD-10-CM | POA: Diagnosis not present

## 2020-09-18 DIAGNOSIS — N2581 Secondary hyperparathyroidism of renal origin: Secondary | ICD-10-CM | POA: Diagnosis not present

## 2020-09-19 DIAGNOSIS — I7300 Raynaud's syndrome without gangrene: Secondary | ICD-10-CM | POA: Diagnosis not present

## 2020-09-19 DIAGNOSIS — L89620 Pressure ulcer of left heel, unstageable: Secondary | ICD-10-CM | POA: Diagnosis not present

## 2020-09-19 DIAGNOSIS — L89616 Pressure-induced deep tissue damage of right heel: Secondary | ICD-10-CM | POA: Diagnosis not present

## 2020-09-19 DIAGNOSIS — I120 Hypertensive chronic kidney disease with stage 5 chronic kidney disease or end stage renal disease: Secondary | ICD-10-CM | POA: Diagnosis not present

## 2020-09-19 DIAGNOSIS — N186 End stage renal disease: Secondary | ICD-10-CM | POA: Diagnosis not present

## 2020-09-19 DIAGNOSIS — E1122 Type 2 diabetes mellitus with diabetic chronic kidney disease: Secondary | ICD-10-CM | POA: Diagnosis not present

## 2020-09-20 DIAGNOSIS — M86179 Other acute osteomyelitis, unspecified ankle and foot: Secondary | ICD-10-CM | POA: Diagnosis not present

## 2020-09-20 DIAGNOSIS — E785 Hyperlipidemia, unspecified: Secondary | ICD-10-CM | POA: Diagnosis not present

## 2020-09-20 DIAGNOSIS — N2581 Secondary hyperparathyroidism of renal origin: Secondary | ICD-10-CM | POA: Diagnosis not present

## 2020-09-20 DIAGNOSIS — D631 Anemia in chronic kidney disease: Secondary | ICD-10-CM | POA: Diagnosis not present

## 2020-09-20 DIAGNOSIS — N186 End stage renal disease: Secondary | ICD-10-CM | POA: Diagnosis not present

## 2020-09-23 ENCOUNTER — Other Ambulatory Visit: Payer: Self-pay

## 2020-09-23 ENCOUNTER — Encounter (HOSPITAL_BASED_OUTPATIENT_CLINIC_OR_DEPARTMENT_OTHER): Payer: Medicare Other | Attending: Internal Medicine | Admitting: Internal Medicine

## 2020-09-23 ENCOUNTER — Encounter (HOSPITAL_COMMUNITY): Payer: Self-pay | Admitting: Emergency Medicine

## 2020-09-23 ENCOUNTER — Inpatient Hospital Stay (HOSPITAL_COMMUNITY)
Admission: EM | Admit: 2020-09-23 | Discharge: 2020-09-26 | DRG: 291 | Disposition: A | Discharge status: Home / Self Care | Payer: Medicare Other | Attending: Internal Medicine | Admitting: Internal Medicine

## 2020-09-23 ENCOUNTER — Emergency Department (HOSPITAL_COMMUNITY): Payer: Medicare Other

## 2020-09-23 DIAGNOSIS — R000 Tachycardia, unspecified: Secondary | ICD-10-CM | POA: Diagnosis not present

## 2020-09-23 DIAGNOSIS — M869 Osteomyelitis, unspecified: Secondary | ICD-10-CM | POA: Diagnosis not present

## 2020-09-23 DIAGNOSIS — L89322 Pressure ulcer of left buttock, stage 2: Secondary | ICD-10-CM | POA: Diagnosis present

## 2020-09-23 DIAGNOSIS — I5023 Acute on chronic systolic (congestive) heart failure: Secondary | ICD-10-CM | POA: Diagnosis not present

## 2020-09-23 DIAGNOSIS — Z9641 Presence of insulin pump (external) (internal): Secondary | ICD-10-CM | POA: Diagnosis present

## 2020-09-23 DIAGNOSIS — I132 Hypertensive heart and chronic kidney disease with heart failure and with stage 5 chronic kidney disease, or end stage renal disease: Secondary | ICD-10-CM | POA: Diagnosis not present

## 2020-09-23 DIAGNOSIS — Z66 Do not resuscitate: Secondary | ICD-10-CM | POA: Diagnosis not present

## 2020-09-23 DIAGNOSIS — E1122 Type 2 diabetes mellitus with diabetic chronic kidney disease: Secondary | ICD-10-CM | POA: Diagnosis not present

## 2020-09-23 DIAGNOSIS — N2581 Secondary hyperparathyroidism of renal origin: Secondary | ICD-10-CM | POA: Diagnosis present

## 2020-09-23 DIAGNOSIS — T8612 Kidney transplant failure: Secondary | ICD-10-CM | POA: Diagnosis present

## 2020-09-23 DIAGNOSIS — E1151 Type 2 diabetes mellitus with diabetic peripheral angiopathy without gangrene: Secondary | ICD-10-CM | POA: Diagnosis present

## 2020-09-23 DIAGNOSIS — L97519 Non-pressure chronic ulcer of other part of right foot with unspecified severity: Secondary | ICD-10-CM | POA: Diagnosis present

## 2020-09-23 DIAGNOSIS — L97529 Non-pressure chronic ulcer of other part of left foot with unspecified severity: Secondary | ICD-10-CM | POA: Diagnosis present

## 2020-09-23 DIAGNOSIS — Z7982 Long term (current) use of aspirin: Secondary | ICD-10-CM

## 2020-09-23 DIAGNOSIS — L89614 Pressure ulcer of right heel, stage 4: Secondary | ICD-10-CM | POA: Diagnosis present

## 2020-09-23 DIAGNOSIS — I742 Embolism and thrombosis of arteries of the upper extremities: Secondary | ICD-10-CM | POA: Diagnosis present

## 2020-09-23 DIAGNOSIS — I255 Ischemic cardiomyopathy: Secondary | ICD-10-CM | POA: Diagnosis present

## 2020-09-23 DIAGNOSIS — L89624 Pressure ulcer of left heel, stage 4: Secondary | ICD-10-CM | POA: Diagnosis present

## 2020-09-23 DIAGNOSIS — I10 Essential (primary) hypertension: Secondary | ICD-10-CM | POA: Diagnosis present

## 2020-09-23 DIAGNOSIS — M549 Dorsalgia, unspecified: Secondary | ICD-10-CM | POA: Diagnosis present

## 2020-09-23 DIAGNOSIS — E11621 Type 2 diabetes mellitus with foot ulcer: Secondary | ICD-10-CM

## 2020-09-23 DIAGNOSIS — L89153 Pressure ulcer of sacral region, stage 3: Secondary | ICD-10-CM | POA: Diagnosis present

## 2020-09-23 DIAGNOSIS — Z833 Family history of diabetes mellitus: Secondary | ICD-10-CM

## 2020-09-23 DIAGNOSIS — I444 Left anterior fascicular block: Secondary | ICD-10-CM | POA: Diagnosis present

## 2020-09-23 DIAGNOSIS — E785 Hyperlipidemia, unspecified: Secondary | ICD-10-CM | POA: Diagnosis not present

## 2020-09-23 DIAGNOSIS — Z992 Dependence on renal dialysis: Secondary | ICD-10-CM

## 2020-09-23 DIAGNOSIS — L89619 Pressure ulcer of right heel, unspecified stage: Secondary | ICD-10-CM

## 2020-09-23 DIAGNOSIS — Y830 Surgical operation with transplant of whole organ as the cause of abnormal reaction of the patient, or of later complication, without mention of misadventure at the time of the procedure: Secondary | ICD-10-CM | POA: Diagnosis present

## 2020-09-23 DIAGNOSIS — Z79899 Other long term (current) drug therapy: Secondary | ICD-10-CM

## 2020-09-23 DIAGNOSIS — E8770 Fluid overload, unspecified: Secondary | ICD-10-CM

## 2020-09-23 DIAGNOSIS — L89623 Pressure ulcer of left heel, stage 3: Secondary | ICD-10-CM

## 2020-09-23 DIAGNOSIS — I509 Heart failure, unspecified: Secondary | ICD-10-CM | POA: Diagnosis not present

## 2020-09-23 DIAGNOSIS — N186 End stage renal disease: Secondary | ICD-10-CM

## 2020-09-23 DIAGNOSIS — M86179 Other acute osteomyelitis, unspecified ankle and foot: Secondary | ICD-10-CM | POA: Diagnosis not present

## 2020-09-23 DIAGNOSIS — Z20822 Contact with and (suspected) exposure to covid-19: Secondary | ICD-10-CM | POA: Diagnosis present

## 2020-09-23 DIAGNOSIS — L8990 Pressure ulcer of unspecified site, unspecified stage: Secondary | ICD-10-CM | POA: Insufficient documentation

## 2020-09-23 DIAGNOSIS — Z794 Long term (current) use of insulin: Secondary | ICD-10-CM | POA: Diagnosis not present

## 2020-09-23 DIAGNOSIS — F32A Depression, unspecified: Secondary | ICD-10-CM | POA: Diagnosis present

## 2020-09-23 DIAGNOSIS — L89613 Pressure ulcer of right heel, stage 3: Secondary | ICD-10-CM | POA: Diagnosis not present

## 2020-09-23 DIAGNOSIS — L89620 Pressure ulcer of left heel, unstageable: Secondary | ICD-10-CM

## 2020-09-23 DIAGNOSIS — I517 Cardiomegaly: Secondary | ICD-10-CM | POA: Diagnosis not present

## 2020-09-23 DIAGNOSIS — Z905 Acquired absence of kidney: Secondary | ICD-10-CM

## 2020-09-23 DIAGNOSIS — R0602 Shortness of breath: Secondary | ICD-10-CM | POA: Diagnosis not present

## 2020-09-23 DIAGNOSIS — D631 Anemia in chronic kidney disease: Secondary | ICD-10-CM | POA: Diagnosis present

## 2020-09-23 DIAGNOSIS — E1169 Type 2 diabetes mellitus with other specified complication: Secondary | ICD-10-CM | POA: Diagnosis present

## 2020-09-23 DIAGNOSIS — I5043 Acute on chronic combined systolic (congestive) and diastolic (congestive) heart failure: Secondary | ICD-10-CM | POA: Diagnosis not present

## 2020-09-23 DIAGNOSIS — E1129 Type 2 diabetes mellitus with other diabetic kidney complication: Secondary | ICD-10-CM | POA: Diagnosis present

## 2020-09-23 DIAGNOSIS — I5021 Acute systolic (congestive) heart failure: Secondary | ICD-10-CM

## 2020-09-23 DIAGNOSIS — L89312 Pressure ulcer of right buttock, stage 2: Secondary | ICD-10-CM | POA: Diagnosis present

## 2020-09-23 DIAGNOSIS — M86171 Other acute osteomyelitis, right ankle and foot: Secondary | ICD-10-CM | POA: Diagnosis present

## 2020-09-23 DIAGNOSIS — E1142 Type 2 diabetes mellitus with diabetic polyneuropathy: Secondary | ICD-10-CM | POA: Diagnosis present

## 2020-09-23 DIAGNOSIS — Z6834 Body mass index (BMI) 34.0-34.9, adult: Secondary | ICD-10-CM

## 2020-09-23 DIAGNOSIS — M6250 Muscle wasting and atrophy, not elsewhere classified, unspecified site: Secondary | ICD-10-CM | POA: Diagnosis present

## 2020-09-23 DIAGNOSIS — J811 Chronic pulmonary edema: Secondary | ICD-10-CM | POA: Diagnosis not present

## 2020-09-23 DIAGNOSIS — J90 Pleural effusion, not elsewhere classified: Secondary | ICD-10-CM | POA: Diagnosis not present

## 2020-09-23 DIAGNOSIS — M86172 Other acute osteomyelitis, left ankle and foot: Secondary | ICD-10-CM | POA: Diagnosis present

## 2020-09-23 LAB — BASIC METABOLIC PANEL
Anion gap: 13 (ref 5–15)
BUN: 16 mg/dL (ref 6–20)
CO2: 27 mmol/L (ref 22–32)
Calcium: 8.7 mg/dL — ABNORMAL LOW (ref 8.9–10.3)
Chloride: 93 mmol/L — ABNORMAL LOW (ref 98–111)
Creatinine, Ser: 4.17 mg/dL — ABNORMAL HIGH (ref 0.61–1.24)
GFR, Estimated: 16 mL/min — ABNORMAL LOW (ref >60)
Glucose, Bld: 160 mg/dL — ABNORMAL HIGH (ref 70–99)
Potassium: 3.7 mmol/L (ref 3.5–5.1)
Sodium: 133 mmol/L — ABNORMAL LOW (ref 135–145)

## 2020-09-23 LAB — CBC
HCT: 37.4 % — ABNORMAL LOW (ref 39.0–52.0)
Hemoglobin: 10.7 g/dL — ABNORMAL LOW (ref 13.0–17.0)
MCH: 28.6 pg (ref 26.0–34.0)
MCHC: 28.6 g/dL — ABNORMAL LOW (ref 30.0–36.0)
MCV: 100 fL (ref 80.0–100.0)
Platelets: 348 10*3/uL (ref 150–400)
RBC: 3.74 MIL/uL — ABNORMAL LOW (ref 4.22–5.81)
RDW: 17.2 % — ABNORMAL HIGH (ref 11.5–15.5)
WBC: 10.1 10*3/uL (ref 4.0–10.5)
nRBC: 0 % (ref 0.0–0.2)

## 2020-09-23 LAB — BRAIN NATRIURETIC PEPTIDE: B Natriuretic Peptide: >4500 pg/mL — ABNORMAL HIGH (ref 0.0–100.0)

## 2020-09-23 LAB — TROPONIN I (HIGH SENSITIVITY)
Troponin I (High Sensitivity): 67 ng/L — ABNORMAL HIGH (ref <18)
Troponin I (High Sensitivity): 67 ng/L — ABNORMAL HIGH (ref <18)

## 2020-09-23 MED ORDER — CALCIUM ACETATE (PHOS BINDER) 667 MG PO CAPS
1334.0000 mg | ORAL_CAPSULE | Freq: Three times a day (TID) | ORAL | Status: DC
  Administered 2020-09-24 – 2020-09-26 (×5): 1334 mg via ORAL
  Filled 2020-09-23 (×8): qty 2

## 2020-09-23 MED ORDER — HEPARIN SODIUM (PORCINE) 5000 UNIT/ML IJ SOLN
5000.0000 [IU] | Freq: Three times a day (TID) | INTRAMUSCULAR | Status: DC
  Administered 2020-09-24 – 2020-09-26 (×9): 5000 [IU] via SUBCUTANEOUS
  Filled 2020-09-23 (×10): qty 1

## 2020-09-23 MED ORDER — FEBUXOSTAT 40 MG PO TABS: 40.0000 mg | ORAL_TABLET | Freq: Every day | ORAL | Status: DC

## 2020-09-23 MED ORDER — OXYCODONE-ACETAMINOPHEN 10-325 MG PO TABS: 1.0000 | ORAL_TABLET | Freq: Two times a day (BID) | ORAL | Status: DC

## 2020-09-23 MED ORDER — FUROSEMIDE 10 MG/ML IJ SOLN
20.0000 mg | Freq: Once | INTRAMUSCULAR | Status: AC
  Administered 2020-09-23: 20 mg via INTRAVENOUS
  Filled 2020-09-23: qty 2

## 2020-09-23 MED ORDER — ACETAMINOPHEN 325 MG PO TABS
650.0000 mg | ORAL_TABLET | Freq: Four times a day (QID) | ORAL | Status: DC | PRN
  Administered 2020-09-24: 650 mg via ORAL
  Filled 2020-09-23: qty 2

## 2020-09-23 MED ORDER — ASPIRIN 325 MG PO TABS
325.0000 mg | ORAL_TABLET | Freq: Every day | ORAL | Status: DC
  Administered 2020-09-24 – 2020-09-26 (×2): 325 mg via ORAL
  Filled 2020-09-23 (×2): qty 1

## 2020-09-23 MED ORDER — CINACALCET HCL 30 MG PO TABS
30.0000 mg | ORAL_TABLET | Freq: Every day | ORAL | Status: DC
  Administered 2020-09-24 – 2020-09-26 (×3): 30 mg via ORAL
  Filled 2020-09-23 (×3): qty 1

## 2020-09-23 MED ORDER — SODIUM CHLORIDE 0.9% FLUSH
3.0000 mL | Freq: Two times a day (BID) | INTRAVENOUS | Status: DC
  Administered 2020-09-24 – 2020-09-26 (×6): 3 mL via INTRAVENOUS

## 2020-09-23 MED ORDER — ACETAMINOPHEN 325 MG PO TABS: 325.0000 mg | ORAL_TABLET | Freq: Four times a day (QID) | ORAL | Status: DC | PRN

## 2020-09-23 MED ORDER — ACETAMINOPHEN 650 MG RE SUPP: 650.0000 mg | Freq: Four times a day (QID) | RECTAL | Status: DC | PRN

## 2020-09-23 MED ORDER — OXYCODONE HCL 5 MG PO TABS
10.0000 mg | ORAL_TABLET | Freq: Four times a day (QID) | ORAL | Status: DC | PRN
  Administered 2020-09-24 – 2020-09-26 (×8): 10 mg via ORAL
  Filled 2020-09-23 (×7): qty 2

## 2020-09-23 MED ORDER — ATORVASTATIN CALCIUM 10 MG PO TABS
10.0000 mg | ORAL_TABLET | Freq: Every evening | ORAL | Status: DC
  Administered 2020-09-25 – 2020-09-26 (×2): 10 mg via ORAL
  Filled 2020-09-23 (×3): qty 1

## 2020-09-23 MED ORDER — LIDOCAINE 5 % EX PTCH
1.0000 | MEDICATED_PATCH | Freq: Every day | CUTANEOUS | Status: DC
  Administered 2020-09-24 – 2020-09-25 (×3): 1 via TRANSDERMAL
  Filled 2020-09-23 (×3): qty 1

## 2020-09-23 MED ORDER — FUROSEMIDE 10 MG/ML IJ SOLN: 60.0000 mg | Freq: Two times a day (BID) | INTRAMUSCULAR | Status: DC

## 2020-09-23 MED ORDER — POLYETHYLENE GLYCOL 3350 17 G PO PACK: 17.0000 g | PACK | Freq: Every day | ORAL | Status: DC | PRN

## 2020-09-23 MED ORDER — INSULIN PUMP
Freq: Three times a day (TID) | SUBCUTANEOUS | Status: DC
  Filled 2020-09-23 (×2): qty 1

## 2020-09-23 MED ORDER — MIDODRINE HCL 5 MG PO TABS
5.0000 mg | ORAL_TABLET | ORAL | Status: DC
  Filled 2020-09-23 (×2): qty 1

## 2020-09-23 NOTE — ED Provider Notes (Signed)
Emergency Medicine Provider Triage Evaluation Note  [name] [name] , a 55 y.o. male  was evaluated in triage.  Pt complains of shortness of breath for 1 week.  Lateral leg swelling.  Has fear that his left foot might need amputation.  Is feeling pain as well.  Dialysis Tuesday, Thursday, Saturday, completed treatment today.  No chest pain  Review of Systems  Positive: Shortness of breath Negative: Fever, chest pain  Physical Exam  BP 137/76   Pulse (!) 111   Temp 99.7 F (37.6 C) (Oral)   Resp 18   SpO2 100%  Gen:   Awake, no distress   Resp:  Normal effort  MSK:   Moves extremities without difficulty  Other:  Missing fingers from bilateral hands, likely missing toes as well.  Lungs are somewhat diminished to auscultation  Medical Decision Making  Medically screening exam initiated at 4:18 PM.  Appropriate orders placed.  [name] [name] was informed that the remainder of the evaluation will be completed by another provider, this initial triage assessment does not replace that evaluation, and the importance of remaining in the ED until their evaluation is complete.     Janeece Fitting, PA-C 09/23/20 1620    Lucrezia Starch, MD 09/24/20 [id number]

## 2020-09-23 NOTE — ED Triage Notes (Signed)
Pt c/o shortness of breath and bilat leg swelling x 1 week. Dialysis Tues/Thurs/Sat, states he went today.

## 2020-09-23 NOTE — H&P (Signed)
History and Physical   KAEDAN [name] [id number] DOB: February 07, 1966 DOA: 09/23/2020  PCP: [name], MD  Patient coming from: Home  Chief Complaint: Shortness of breath, edema  HPI: [name] [name] is a 55 y.o. male with medical history significant of upper extremity arterial thrombosis, GI bleed, CHF, ESRD on HD, depression, diabetes, osteomyelitis, diabetic foot ulcer, hypertension, hyperlipidemia who presents with ongoing shortness of breath and bilateral edema.  Patient states he has had about 1 week of ongoing shortness of breath and acute on chronic bilateral edema.  He states that he is compliant with his dialysis and has attended his sessions recently including today, he states they have also tried pulling off extra fluid.  He remains short of breath and worsening edema. He state he no longer makes urine. He denies fevers, chills, chest pain, abdominal pain, constipation, diarrhea, nausea, vomiting.  ED Course: Vital signs in the ED significant for blood pressure in the [id number] systolic and heart rate in the 100s.  Respiratory rate in the teens and intermittently into the 20s.  Oxygenating well on room air.  Lab work-up shows BMP with sodium 133, chloride 93, creatinine 4.17 which is consistent with his ESRD, glucose 160, calcium 8.7.  CBC showed hemoglobin stable at 10.7.  BNP greater than 4500.  Troponin flat at 67 on first check and again 67 on repeat.  Respiratory panel for flu and COVID will be obtained.  Chest x-ray showed cardiomegaly with congestion and pulmonary edema with small right pleural effusion.  Patient received dose of IV Lasix in the ED.  Review of Systems: As per HPI otherwise all other systems reviewed and are negative.  Past Medical History:  Diagnosis Date   Anal infection    posterior anal canal   Anemia, chronic renal failure    Atrophic kidney    BILATERAL   DM type 2 causing ESRD ([place])    Nephrologist-- dr [name] [facility])--  on  hemodialysis since June 2012 at  [name]  TTS   Hemodialysis patient [facility])    at [place] on Tues/ Thur/Sat/schedule   Hemorrhoids    Hepatitis B antibody positive    History of pleural effusion    bilateral   Hyperparathyroidism, secondary renal ([place])    Hypertension    Ischemic cardiomyopathy    per echo 07-01-2014  ef 45%   LAFB (left anterior fascicular block)    Peripheral neuropathy    Systolic and diastolic CHF, chronic ([place])    CARDIOLOGIST-  DR Daneen Schick ([place])  AND DR [name] ([name])    Past Surgical History:  Procedure Laterality Date   APPENDECTOMY  09-12-2004   laparotomy w/ drainage peritinitis   AV FISTULA PLACEMENT  02-27-2010   right forearm (RADIOCEPHALIC)   AV FISTULA REPAIR  10-30-2010   BONE BIOPSY Left 05/14/2020   Procedure: BONE BIOPSY;  Surgeon: Trula Slade, DPM;  Location: [place];  Service: Podiatry;  Laterality: Left;   CARDIOVASCULAR STRESS TEST  10-29-2011   dr Daneen Schick   Low risk scan;  mild perfusion defect seen in the basal inferoseptal, basal inferior and mid inferior regions consistent with an infarct/scar and/or overlying attenuation/  mild to moderate global LVSF,  ef 40-45%   DOBUTAMINE STRESS ECHO  07-23-2012   [name]   abnormal ;  at rest estimated lvef 25-30% and global severe LV hypokinesis ;  no cp during stress and achieved 85% maxium predicted heart  rate;  negative stress ECG for inducible ischemia;  estimated lvef with stress 35-40%;  augmentation of wall segments consistant with cardiomyopathy and differential fibrosis   FISTULOTOMY N/A 03/26/2015   Procedure: FISTULOTOMY;  Surgeon: [name], MD;  Location[facility];  Service: General;  Laterality: N/A;   GRAFT APPLICATION Left 0000000   Procedure: GRAFT APPLICATION;  Surgeon: Trula Slade, DPM;  Location: [place];  Service: Podiatry;  Laterality: Left;   GRAFT APPLICATION Bilateral  0000000   Procedure: GRAFT APPLICATION;  Surgeon: Trula Slade, DPM;  Location: [facility];  Service: Podiatry;  Laterality: Bilateral;   INCISION AND DRAINAGE ABSCESS N/A 03/26/2015   Procedure: ANAL INCISION AND DRAINAGE;  Surgeon: [name], MD;  Location: [place];  Service: General;  Laterality: N/A;   RETINAL DETACHMENT SURGERY Left 2011   incomplete repair/ needs eye drops to keep pressure down   TEE WITHOUT CARDIOVERSION N/A 10/17/2017   Procedure: TRANSESOPHAGEAL ECHOCARDIOGRAM (TEE);  Surgeon: Josue Hector, MD;  Location[facility];  Service: Cardiovascular;  Laterality: N/A;   TRANSTHORACIC ECHOCARDIOGRAM  07-01-2014    done at [facility]   grade 1 diastolic dysfunction,  ef 45%/  trace TR and PR   WOUND DEBRIDEMENT Left 05/14/2020   Procedure: DEBRIDEMENT WOUND;  Surgeon: Trula Slade, DPM;  Location: [place];  Service: Podiatry;  Laterality: Left;   WOUND DEBRIDEMENT Bilateral 06/04/2020   Procedure: DEBRIDEMENT WOUND BOTH FEET;  Surgeon: Trula Slade, DPM;  Location: [facility];  Service: Podiatry;  Laterality: Bilateral;    Social History  reports that he has never smoked. He has never used smokeless tobacco. He reports that he does not drink alcohol and does not use drugs.  No Known Allergies  Family History  Problem Relation Age of Onset   Diabetes Other   Reviewed on admission  Prior to Admission medications   Medication Sig Start Date End Date Taking? Authorizing Provider  aspirin 325 MG tablet Take 325 mg by mouth daily.    [provider]  atorvastatin (LIPITOR) 10 MG tablet Take 10 mg by mouth every evening.    [provider]  calcium acetate (PHOSLO) 667 MG capsule Take 1,334 mg by mouth 3 (three) times daily with meals. 06/02/10   [provider]  cinacalcet (SENSIPAR) 30 MG tablet Take 30 mg by mouth daily.    [provider]  collagenase (SANTYL) ointment Apply 1 application  topically daily. 06/23/20   McDonald, Stephan Minister, DPM  Continuous Blood Gluc Sensor (FREESTYLE LIBRE 14 DAY SENSOR) MISC See admin instructions. 04/08/20   [provider]  cycloSPORINE, PF, (CEQUA) 0.09 % SOLN Place 1 drop into both eyes daily.    [provider]  febuxostat (ULORIC) 40 MG tablet Take 40 mg by mouth daily.    [provider]  gabapentin (NEURONTIN) 300 MG capsule Take 300 mg by mouth 3 (three) times daily. 05/09/15   [provider]  Insulin Disposable Pump (OMNIPOD DASH 5 PACK PODS) MISC Inject into the skin. Use with Humalog PUMP 08/28/19   [provider]  insulin lispro (HUMALOG) 100 UNIT/ML injection PUMP    [provider]  lidocaine (XYLOCAINE) 5 % ointment Apply 1 application topically 2 (two) times daily as needed (finger pain). 07/14/19   [provider]  midodrine (PROAMATINE) 5 MG tablet Take 5 mg by mouth every Tuesday, Thursday, and Saturday at 6 PM.    [provider]  Multiple Vitamin (MULTIVITAMIN WITH MINERALS) TABS tablet Take 1 tablet by mouth daily in the afternoon.    [provider]  mupirocin ointment (BACTROBAN) 2 % Apply 1 application topically daily as needed (itching). 07/16/20   [provider]  ondansetron (ZOFRAN) 4 MG tablet Take 4 mg by mouth 3 (three) times daily as needed for nausea or vomiting. 06/27/19   [provider]  oxyCODONE-acetaminophen (PERCOCET) 10-325 MG tablet Take 1 tablet by mouth 2 (two) times daily. 09/12/20   [provider]  Polyethyl Glycol-Propyl Glycol (SYSTANE) 0.4-0.3 % SOLN Place 1 drop into both eyes daily as needed (dry eyes).    [provider]  sucroferric oxyhydroxide (VELPHORO) 500 MG chewable tablet Chew 500 mg by mouth daily.    [provider]  vitamin B-12 (CYANOCOBALAMIN) 500 MCG tablet Take 500 mcg by mouth daily.    [provider]    Physical Exam: Vitals:   09/23/20 2015 09/23/20 2100  09/23/20 2130 09/23/20 2137  BP: 138/87 133/77 126/81 126/81  Pulse:    (!) 105  Resp: 18 (!) 23 (!) 25 (!) 27  Temp:      TempSrc:      SpO2:    97%  Weight:      Height:       Physical Exam Constitutional:      General: He is not in acute distress.    Appearance: Normal appearance.  HENT:     Head: Normocephalic and atraumatic.     Mouth/Throat:     Mouth: Mucous membranes are moist.     Pharynx: Oropharynx is clear.  Eyes:     Extraocular Movements: Extraocular movements intact.     Pupils: Pupils are equal, round, and reactive to light.  Cardiovascular:     Rate and Rhythm: Normal rate and regular rhythm.     Pulses: Normal pulses.     Heart sounds: Normal heart sounds.  Pulmonary:     Effort: Pulmonary effort is normal. No respiratory distress.     Breath sounds: Rales present.  Abdominal:     General: Bowel sounds are normal. There is no distension.     Palpations: Abdomen is soft.     Tenderness: There is no abdominal tenderness.  Musculoskeletal:        General: No swelling or deformity.     Right lower leg: Edema present.     Left lower leg: Edema present.  Skin:    General: Skin is warm and dry.  Neurological:     General: No focal deficit present.     Mental Status: Mental status is at baseline.   Labs on Admission: I have personally reviewed following labs and imaging studies  CBC: Recent Labs  Lab 09/23/20 1730  WBC 10.1  HGB 10.7*  HCT 37.4*  MCV 100.0  PLT 0000000    Basic Metabolic Panel: Recent Labs  Lab 09/23/20 1730  NA 133*  K 3.7  CL 93*  CO2 27  GLUCOSE 160*  BUN 16  CREATININE 4.17*  CALCIUM 8.7*    GFR: Estimated Creatinine Clearance: 22.8 mL/min (A) (by C-G formula based on SCr of 4.17 mg/dL (H)).  Liver Function Tests: No results for input(s): AST, ALT, ALKPHOS, BILITOT, PROT, ALBUMIN in the last 168 hours.  Urine analysis: No results found for: COLORURINE, APPEARANCEUR, [place], [place], GLUCOSEU, HGBUR, BILIRUBINUR,  KETONESUR, PROTEINUR, UROBILINOGEN, NITRITE, LEUKOCYTESUR  Radiological Exams on Admission: DG Chest 2 View  Result Date: 09/23/2020 CLINICAL DATA:  Shortness of breath EXAM: CHEST - 2 VIEW COMPARISON:  07/21/2020, CT 11/26/2018 FINDINGS: Cardiomegaly with vascular congestion and mild interstitial edema. Small right-sided pleural effusion with airspace disease at the right base. No pneumothorax. IMPRESSION: 1. Cardiomegaly with vascular congestion, mild pulmonary edema, and small right-sided pleural effusion 2. Airspace disease at the right base which may be due to atelectasis or pneumonia Electronically Signed   By: Donavan Foil M.D.   On: 09/23/2020 16:57    EKG: Independently reviewed.  Sinus tachycardia at 100 bpm.  Nonspecific T wave abnormality.  Similar to previous.  Was previously tachycardic as well.  Assessment/Plan Principal Problem:   Acute exacerbation of CHF (congestive heart failure) ([name]) Active Problems:   Essential hypertension   ESRD on hemodialysis ([name])   HLD (hyperlipidemia)   Type II diabetes mellitus with renal manifestations ([name])   Secondary hyperparathyroidism ([name])   Volume overload  Volume overload Acute exacerbation of CHF > Patient with a history of CHF and ESRD on HD.  Coming in with 1 week of shortness of breath and acute on chronic edema despite attending his dialysis sessions. > Patient appears volume overloaded on exam and has elevated BNP even higher than previous at greater than 4500.  Troponin flat.  Chest x-ray with changes of congestion, pulmonary edema and small pleural effusion. > Last echocardiogram was in February 2021 with EF 40-45%, G2 DD, RV not visualized well. > Patient received dose of Lasix in the ED.  He states he does not still make urine.  Will need assistance of nephrology to remove fluid with dialysis.  Possibly he has a new dry weight. - Nephrology consult in the morning for dialysis, no urgent indication at this time - Monitor on  telemetry - Strict I/os, daily weights - Trend renal function electrolytes - Check magnesium - Echocardiogram  ESRD on HD > Patient on dialysis Tuesday Thursday Saturday.  Completed his session today.  As above having increasing edema and shortness of breath consistent with volume overload/CHF exacerbation. > Possibility that he needs his dry weight reduced, consult nephrology tomorrow for dialysis / fluid removal. - Nephrology consult in the morning - Continue with home Cinacalcet and PhosLo - Avoid nephrotoxic agents - Trend renal function and electrolytes  Chronic edema Wounds > History of osteomyelitis and diabetic foot with chronic edema. - Continue as needed oxycodone - Lidocaine patch for back pain  Diabetes > On insulin pump - Continue home insulin pump  Hypertension - Not currently on antihypertensives, actually is on midodrine with HD.  Hyperlipidemia - Continue home atorvastatin  DVT prophylaxis: Heparin  Code Status:   DNR, intubation okay Family Communication:  None on admission Disposition Plan:   Patient is from:  Home  Anticipated DC to:  Home  Anticipated DC date:  Home 1 to 3 days  Anticipated DC barriers: None  Consults called:  None, will need nephrology if he stays here long enough to need dialysis and/or requires dialysis for volume removal  Admission status:  Observation, telemetry  Severity of Illness: The appropriate patient status for this patient is OBSERVATION. Observation status is judged to be reasonable and necessary in order to provide the required intensity of service to ensure the patient's safety. The patient's presenting symptoms, physical exam findings, and initial radiographic and laboratory data in the context of their medical condition is felt to place them at decreased risk for further clinical deterioration. Furthermore, it is anticipated that the patient will be medically stable for discharge from the  hospital within 2 midnights of  admission. The following factors support the patient status of observation.   " The patient's presenting symptoms include shortness of breath, edema. " The physical exam findings include edema, rales. " The initial radiographic and laboratory data are  Lab work-up shows BMP with sodium 133, chloride 93, creatinine 4.17 which is consistent with his ESRD, glucose 160, calcium 8.7.  CBC showed hemoglobin stable at 10.7.  BNP greater than 4500.  Troponin flat at 67 on first check and again 67 on repeat.  Respiratory panel for flu and COVID will be obtained.  Chest x-ray showed cardiomegaly with congestion and pulmonary edema with small right pleural effusion.Marcelyn Bruins MD [facility]  How to contact the [facility] Attending or Consulting provider [place] or covering provider during after hours [place], for this patient?   Check the care team in [facility] and look for a) attending/consulting [facility] provider listed and b) the [facility] team listed Log into [email or web address] and use 's universal password to access. If you do not have the password, please contact the hospital operator. Locate the [facility] provider you are looking for under [facility] and page to a number that you can be directly reached. If you still have difficulty reaching the provider, please page the [facility] (Director on Call) for the Hospitalists listed on amion for assistance.  09/23/2020, 11:07 PM

## 2020-09-23 NOTE — Pre-Procedure Instructions (Signed)
Surgical Instructions    Your procedure is scheduled on Wednesday July 13th.  Report to [facility] Main Entrance "A" at 06:30 A.M., then check in with the Admitting office.  Call this number if you have problems the morning of surgery:  [phone]   If you have any questions prior to your surgery date call [phone]: Open Monday-Friday 8am-4pm    Remember:  Do not eat after midnight the night before your surgery  You may drink clear liquids until 05:30 A.M. the morning of your surgery.   Clear liquids allowed are: Water, Non-Citrus Juices (without pulp), Carbonated Beverages, Clear Tea, Black Coffee Only, and Gatorade Patient Instructions  The night before surgery:  No food after midnight. ONLY clear liquids after midnight  The day of surgery (if you have diabetes): Drink ONE (1) 12 oz G2 given to you in your pre admission testing appointment by 05:30 A.M.  the morning of surgery. Drink in one sitting. Do not sip.  This drink was given to you during your hospital  pre-op appointment visit.  Nothing else to drink after completing the  12 oz bottle of G2.         If you have questions, please contact your surgeon's office.     Take these medicines the morning of surgery with A SIP OF WATER   cycloSPORINE, PF, (CEQUA)  febuxostat (ULORIC)  gabapentin (NEURONTIN)   ondansetron (ZOFRAN)- If needed  oxyCODONE-acetaminophen (PERCOCET)   Polyethyl Glycol-Propyl Glycol (SYSTANE)- If needed  As of today, STOP taking any Aspirin (unless otherwise instructed by your surgeon) Aleve, Naproxen, Ibuprofen, Motrin, Advil, Goody's, BC's, all herbal medications, fish oil, and all vitamins.  WHAT DO I DO ABOUT MY DIABETES MEDICATION?   Do not take oral diabetes medicines (pills) the morning of surgery.   If you have not received instructions from your PCP or endocrinologist on how to manage your insulin pump please contact their office. If you do not receive instructions then reduce  all basal rates by 20 percent at midnight the night before your surgery.   The day of surgery, do not take other diabetes injectables, including Byetta (exenatide), Bydureon (exenatide ER), Victoza (liraglutide), or Trulicity (dulaglutide).  If your CBG is greater than 220 mg/dL, you may take  of your sliding scale (correction) dose of insulin.   HOW TO MANAGE YOUR DIABETES BEFORE AND AFTER SURGERY  Why is it important to control my blood sugar before and after surgery? Improving blood sugar levels before and after surgery helps healing and can limit problems. A way of improving blood sugar control is eating a healthy diet by:  Eating less sugar and carbohydrates  Increasing activity/exercise  Talking with your doctor about reaching your blood sugar goals High blood sugars (greater than 180 mg/dL) can raise your risk of infections and slow your recovery, so you will need to focus on controlling your diabetes during the weeks before surgery. Make sure that the doctor who takes care of your diabetes knows about your planned surgery including the date and location.  How do I manage my blood sugar before surgery? Check your blood sugar at least 4 times a day, starting 2 days before surgery, to make sure that the level is not too high or low.  Check your blood sugar the morning of your surgery when you wake up and every 2 hours until you get to the Short Stay unit.  If your blood sugar is less than 70 mg/dL, you will need to treat  for low blood sugar: Do not take insulin. Treat a low blood sugar (less than 70 mg/dL) with  cup of clear juice (cranberry or apple), 4 glucose tablets, OR glucose gel. Recheck blood sugar in 15 minutes after treatment (to make sure it is greater than 70 mg/dL). If your blood sugar is not greater than 70 mg/dL on recheck, call [phone] for further instructions. Report your blood sugar to the short stay nurse when you get to Short Stay.  If you are admitted to  the hospital after surgery: Your blood sugar will be checked by the staff and you will probably be given insulin after surgery (instead of oral diabetes medicines) to make sure you have good blood sugar levels. The goal for blood sugar control after surgery is 80-180 mg/dL.                      Do NOT Smoke (Tobacco/Vaping) or drink Alcohol 24 hours prior to your procedure.  If you use a CPAP at night, you may bring all equipment for your overnight stay.   Contacts, glasses, piercing's, hearing aid's, dentures or partials may not be worn into surgery, please bring cases for these belongings.    For patients admitted to the hospital, discharge time will be determined by your treatment team.   Patients discharged the day of surgery will not be allowed to drive home, and someone needs to stay with them for 24 hours.  ONLY 1 SUPPORT PERSON MAY BE PRESENT WHILE YOU ARE IN SURGERY. IF YOU ARE TO BE ADMITTED ONCE YOU ARE IN YOUR ROOM YOU WILL BE ALLOWED TWO (2) VISITORS.  Minor children may have two parents present. Special consideration for safety and communication needs will be reviewed on a case by case basis.   Special instructions:   [place]- Preparing For Surgery  Before surgery, you can play an important role. Because skin is not sterile, your skin needs to be as free of germs as possible. You can reduce the number of germs on your skin by washing with CHG (chlorahexidine gluconate) Soap before surgery.  CHG is an antiseptic cleaner which kills germs and bonds with the skin to continue killing germs even after washing.    Oral Hygiene is also important to reduce your risk of infection.  Remember - BRUSH YOUR TEETH THE MORNING OF SURGERY WITH YOUR REGULAR TOOTHPASTE  Please do not use if you have an allergy to CHG or antibacterial soaps. If your skin becomes reddened/irritated stop using the CHG.  Do not shave (including legs and underarms) for at least 48 hours prior to first CHG  shower. It is OK to shave your face.  Please follow these instructions carefully.   Shower the NIGHT BEFORE SURGERY and the MORNING OF SURGERY  If you chose to wash your hair, wash your hair first as usual with your normal shampoo.  After you shampoo, rinse your hair and body thoroughly to remove the shampoo.  Use CHG Soap as you would any other liquid soap. You can apply CHG directly to the skin and wash gently with a scrungie or a clean washcloth.   Apply the CHG Soap to your body ONLY FROM THE NECK DOWN.  Do not use on open wounds or open sores. Avoid contact with your eyes, ears, mouth and genitals (private parts). Wash Face and genitals (private parts)  with your normal soap.   Wash thoroughly, paying special attention to the area where your surgery will  be performed.  Thoroughly rinse your body with warm water from the neck down.  DO NOT shower/wash with your normal soap after using and rinsing off the CHG Soap.  Pat yourself dry with a CLEAN TOWEL.  Wear CLEAN PAJAMAS to bed the night before surgery  Place CLEAN SHEETS on your bed the night before your surgery  DO NOT SLEEP WITH PETS.   Day of Surgery: Shower with CHG soap. Do not wear jewelry, make up, nail polish, gel polish, artificial nails, or any other type of covering on natural nails including finger and toenails. If patients have artificial nails, gel coating, etc. that need to be removed by a nail salon please have this removed prior to surgery. Surgery may need to be canceled/delayed if the surgeon/ anesthesia feels like the patient is unable to be adequately monitored. Do not wear lotions, powders, perfumes/colognes, or deodorant. Do not shave 48 hours prior to surgery.  Men may shave face and neck. Do not bring valuables to the hospital. [facility] is not responsible for any belongings or valuables. Wear Clean/Comfortable clothing the morning of surgery Remember to brush your teeth WITH YOUR REGULAR  TOOTHPASTE.   Please read over the following fact sheets that you were given.

## 2020-09-23 NOTE — ED Provider Notes (Signed)
[facility] EMERGENCY DEPARTMENT Provider Note   CSN: [id number] Arrival date & time: 09/23/20  1553     History Chief Complaint  Patient presents with   Shortness of Breath    [name] [name] is a 55 y.o. male.  HPI  55 year old male with past medical history of end-stage renal disease on dialysis every TRS, HTN, DM, presents to the emergency department with worsening shortness of breath for the past week.  Patient states has been compliant with his dialysis, had a full session today but continues to be short of breath.  He has an intermittently productive cough.  Denies any fever.  He has chronic swelling of the lower extremities with peripheral vascular disease and wounds.  At this time he denies any chest pain.  No GI symptoms.  Past Medical History:  Diagnosis Date   Anal infection    posterior anal canal   Anemia, chronic renal failure    Atrophic kidney    BILATERAL   DM type 2 causing ESRD ([place])    Nephrologist-- dr [name] [facility])--  on hemodialysis since June 2012 at  [name]  TTS   Hemodialysis patient [facility])    at [place] on Tues/ Thur/Sat/schedule   Hemorrhoids    Hepatitis B antibody positive    History of pleural effusion    bilateral   Hyperparathyroidism, secondary renal ([place])    Hypertension    Ischemic cardiomyopathy    per echo 07-01-2014  ef 45%   LAFB (left anterior fascicular block)    Peripheral neuropathy    Systolic and diastolic CHF, chronic ([place])    CARDIOLOGIST-  DR Daneen Schick ([place])  AND DR [name] ([name])    Patient Active Problem List   Diagnosis Date Noted   Osteomyelitis due to type 2 diabetes mellitus ([place]) 05/12/2020   Diabetic foot ulcer ([place]) 05/12/2020   HLD (hyperlipidemia) 02/08/2020   Secondary hyperparathyroidism ([place]) 02/08/2020   Secondary anemia 02/08/2020   Retinal detachment 02/08/2020   Medication monitoring encounter 07/25/2019    Penile mass 07/16/2019   BMI 40.0-44.9, adult ([place]) 12/20/2018   Wound infection 09/15/2018   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)    Acute GI bleeding 10/13/2017   ESRD on hemodialysis ([place]) 10/13/2017   Embolism, arterial ([place]) 10/12/2017   Ischemia of finger 2017/10/09   Deceased-donor kidney transplant 06/04/2016   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[place]) 06/04/2016   Diabetes mellitus type 2, uncontrolled ([place]) 05/03/2014   Non-ischemic cardiomyopathy ([place]) 05/03/2014   Type II diabetes mellitus with renal manifestations ([place]) 02/01/2011   Mechanical complication of other vascular device, implant, and graft 02/01/2011   HYPERCHOLESTEROLEMIA 08/19/2009   Essential hypertension 99991111   Chronic systolic heart failure ([place]) 08/19/2009   CKD (chronic kidney disease) stage V requiring chronic dialysis ([place]) 08/19/2009   Depression 08/19/2009    Past Surgical History:  Procedure Laterality Date   APPENDECTOMY  09-12-2004   laparotomy w/ drainage peritinitis   AV FISTULA PLACEMENT  02-27-2010   right forearm (RADIOCEPHALIC)   AV FISTULA REPAIR  10-30-2010   BONE BIOPSY Left 05/14/2020   Procedure: BONE BIOPSY;  Surgeon: Trula Slade, DPM;  Location: [place];  Service: Podiatry;  Laterality: Left;   CARDIOVASCULAR STRESS TEST  10-29-2011   dr Daneen Schick   Low risk scan;  mild perfusion defect seen in the basal inferoseptal, basal inferior and mid inferior regions consistent  with an infarct/scar and/or overlying attenuation/  mild to moderate global LVSF,  ef 40-45%   DOBUTAMINE STRESS ECHO  07-23-2012   [name]   abnormal ;  at rest estimated lvef 25-30% and global severe LV hypokinesis ;  no cp during stress and achieved 85% maxium predicted heart rate;  negative stress ECG for inducible ischemia;  estimated lvef with stress 35-40%;  augmentation of wall segments consistant with cardiomyopathy and differential fibrosis   FISTULOTOMY N/A  03/26/2015   Procedure: FISTULOTOMY;  Surgeon: [name], MD;  Location[facility];  Service: General;  Laterality: N/A;   GRAFT APPLICATION Left 0000000   Procedure: GRAFT APPLICATION;  Surgeon: Trula Slade, DPM;  Location: [place];  Service: Podiatry;  Laterality: Left;   GRAFT APPLICATION Bilateral 0000000   Procedure: GRAFT APPLICATION;  Surgeon: Trula Slade, DPM;  Location: [facility];  Service: Podiatry;  Laterality: Bilateral;   INCISION AND DRAINAGE ABSCESS N/A 03/26/2015   Procedure: ANAL INCISION AND DRAINAGE;  Surgeon: [name], MD;  Location: [place];  Service: General;  Laterality: N/A;   RETINAL DETACHMENT SURGERY Left 2011   incomplete repair/ needs eye drops to keep pressure down   TEE WITHOUT CARDIOVERSION N/A 10/17/2017   Procedure: TRANSESOPHAGEAL ECHOCARDIOGRAM (TEE);  Surgeon: Josue Hector, MD;  Location: [facility] ENDOSCOPY;  Service: Cardiovascular;  Laterality: N/A;   TRANSTHORACIC ECHOCARDIOGRAM  07-01-2014    done at [facility]   grade 1 diastolic dysfunction,  ef 45%/  trace TR and PR   WOUND DEBRIDEMENT Left 05/14/2020   Procedure: DEBRIDEMENT WOUND;  Surgeon: Trula Slade, DPM;  Location: [place];  Service: Podiatry;  Laterality: Left;   WOUND DEBRIDEMENT Bilateral 06/04/2020   Procedure: DEBRIDEMENT WOUND BOTH FEET;  Surgeon: Trula Slade, DPM;  Location: [facility];  Service: Podiatry;  Laterality: Bilateral;       Family History  Problem Relation Age of Onset   Diabetes Other     Social History   Tobacco Use   Smoking status: Never   Smokeless tobacco: Never  Vaping Use   Vaping Use: Never used  Substance Use Topics   Alcohol use: No   Drug use: No    Home Medications Prior to Admission medications   Medication Sig Start Date End Date Taking? Authorizing Provider  aspirin 325 MG tablet Take 325 mg by mouth daily.    [provider]  atorvastatin (LIPITOR) 10 MG tablet  Take 10 mg by mouth every evening.    [provider]  calcium acetate (PHOSLO) 667 MG capsule Take 1,334 mg by mouth 3 (three) times daily with meals. 06/02/10   [provider]  cinacalcet (SENSIPAR) 30 MG tablet Take 30 mg by mouth daily.    [provider]  collagenase (SANTYL) ointment Apply 1 application topically daily. 06/23/20   McDonald, Stephan Minister, DPM  Continuous Blood Gluc Sensor (FREESTYLE LIBRE 14 DAY SENSOR) MISC See admin instructions. 04/08/20   [provider]  cycloSPORINE, PF, (CEQUA) 0.09 % SOLN Place 1 drop into both eyes daily.    [provider]  febuxostat (ULORIC) 40 MG tablet Take 40 mg by mouth daily.    [provider]  gabapentin (NEURONTIN) 300 MG capsule Take 300 mg by mouth 3 (three) times daily. 05/09/15   [provider]  Insulin Disposable Pump (OMNIPOD DASH 5 PACK PODS) MISC Inject into the skin. Use with Humalog PUMP 08/28/19   [provider]  insulin lispro (HUMALOG) 100 UNIT/ML injection PUMP    [provider]  lidocaine (XYLOCAINE) 5 % ointment Apply 1 application topically 2 (two) times daily as needed (finger pain). 07/14/19   [provider]  midodrine (PROAMATINE) 5 MG tablet Take 5 mg by mouth every Tuesday, Thursday, and Saturday at 6 PM.    [provider]  Multiple Vitamin (MULTIVITAMIN WITH MINERALS) TABS tablet Take 1 tablet by mouth daily in the afternoon.    [provider]  mupirocin ointment (BACTROBAN) 2 % Apply 1 application topically daily as needed (itching). 07/16/20   [provider]  ondansetron (ZOFRAN) 4 MG tablet Take 4 mg by mouth 3 (three) times daily as needed for nausea or vomiting. 06/27/19   [provider]  oxyCODONE-acetaminophen (PERCOCET) 10-325 MG tablet Take 1 tablet by mouth 2 (two) times daily. 09/12/20   [provider]  Polyethyl Glycol-Propyl Glycol (SYSTANE) 0.4-0.3 % SOLN Place 1 drop into both  eyes daily as needed (dry eyes).    [provider]  sucroferric oxyhydroxide (VELPHORO) 500 MG chewable tablet Chew 500 mg by mouth daily.    [provider]  vitamin B-12 (CYANOCOBALAMIN) 500 MCG tablet Take 500 mcg by mouth daily.    [provider]    Allergies    Patient has no known allergies.  Review of Systems   Review of Systems  Constitutional:  Positive for fatigue. Negative for chills and fever.  HENT:  Negative for congestion.   Eyes:  Negative for visual disturbance.  Respiratory:  Positive for cough and shortness of breath.   Cardiovascular:  Positive for palpitations and leg swelling. Negative for chest pain.  Gastrointestinal:  Negative for abdominal pain, diarrhea and vomiting.  Musculoskeletal:  Negative for back pain.  Skin:  Negative for rash.  Neurological:  Negative for headaches.   Physical Exam Updated Vital Signs BP 126/81   Pulse (!) 105   Temp 99.7 F (37.6 C) (Oral)   Resp (!) 27   Ht '5\' 5"'$  (1.651 m)   Wt 107 kg   SpO2 97%   BMI 39.27 kg/m   Physical Exam Vitals and nursing note reviewed.  Constitutional:      Appearance: Normal appearance. He is ill-appearing.  HENT:     Head: Normocephalic.     Mouth/Throat:     Mouth: Mucous membranes are moist.  Cardiovascular:     Rate and Rhythm: Normal rate.  Pulmonary:     Effort: Tachypnea present. No respiratory distress.     Breath sounds: Rales present.  Abdominal:     Palpations: Abdomen is soft.     Tenderness: There is no abdominal tenderness.  Musculoskeletal:     Comments: Chronic bilateral lower extremities edema with chronic overlying skin changes, both feet are wrapped and in boots, very difficult to evaluate for peripheral vascular flow given habitus and chronic changes  Skin:    General: Skin is warm.  Neurological:     Mental Status: He is alert and oriented to person, place, and time. Mental status is at baseline.  Psychiatric:        Mood and  Affect: Mood normal.    ED Results / Procedures / Treatments   Labs (all labs ordered are listed, but only abnormal results are displayed) Labs Reviewed  BASIC METABOLIC PANEL - Abnormal; Notable for the following components:      Result Value   Sodium 133 (*)    Chloride 93 (*)  Glucose, Bld 160 (*)    Creatinine, Ser 4.17 (*)    Calcium 8.7 (*)    GFR, Estimated 16 (*)    All other components within normal limits  CBC - Abnormal; Notable for the following components:   RBC 3.74 (*)    Hemoglobin 10.7 (*)    HCT 37.4 (*)    MCHC 28.6 (*)    RDW 17.2 (*)    All other components within normal limits  BRAIN NATRIURETIC PEPTIDE - Abnormal; Notable for the following components:   B Natriuretic Peptide >4,500.0 (*)    All other components within normal limits  TROPONIN I (HIGH SENSITIVITY) - Abnormal; Notable for the following components:   Troponin I (High Sensitivity) 67 (*)    All other components within normal limits  TROPONIN I (HIGH SENSITIVITY) - Abnormal; Notable for the following components:   Troponin I (High Sensitivity) 67 (*)    All other components within normal limits    EKG EKG Interpretation  Date/Time:  Tuesday September 23 2020 15:59:34 EDT Ventricular Rate:  111 PR Interval:  158 QRS Duration: 84 QT Interval:  334 QTC Calculation: 454 R Axis:   148 Text Interpretation: Sinus tachycardia Possible Right ventricular hypertrophy Nonspecific T wave abnormality Abnormal ECG Sinus tachycardia, similar to previous Confirmed by Lavenia Atlas [phone]) on 09/23/2020 7:29:35 PM  Radiology DG Chest 2 View  Result Date: 09/23/2020 CLINICAL DATA:  Shortness of breath EXAM: CHEST - 2 VIEW COMPARISON:  07/21/2020, CT 11/26/2018 FINDINGS: Cardiomegaly with vascular congestion and mild interstitial edema. Small right-sided pleural effusion with airspace disease at the right base. No pneumothorax. IMPRESSION: 1. Cardiomegaly with vascular congestion, mild pulmonary edema, and  small right-sided pleural effusion 2. Airspace disease at the right base which may be due to atelectasis or pneumonia Electronically Signed   By: Donavan Foil M.D.   On: 09/23/2020 16:57    Procedures Procedures   Medications Ordered in ED Medications  furosemide (LASIX) injection 20 mg (20 mg Intravenous Given 09/23/20 2031)    ED Course  I have reviewed the triage vital signs and the nursing notes.  Pertinent labs & imaging results that were available during my care of the patient were reviewed by me and considered in my medical decision making (see chart for details).    MDM Rules/Calculators/A&P                          55 year old male presents emergency department worsening shortness of breath for the past week.  Intermittently productive cough.  He is tachycardic on arrival, at times tachypneic.  Maintaining his oxygen saturation, afebrile.  He is ill-appearing and dyspneic with conversation.  EKG shows sinus tachycardia but otherwise appears unchanged for the patient.  Blood work shows baseline anemia and kidney dysfunction.  He also has baseline elevated troponins, BNP is higher than it has been in the past.  Chest x-ray shows cardiomegaly, pulmonary vascular congestion with a pleural effusion.  This looks significantly, progressed when compared to previous chest x-rays.  Last echo was over a year ago with an EF of 40%.  Patient has worsening shortness of breath and cardiomegaly/pulmonary edema in the setting of compliance with dialysis.  Patient will need to be admitted for CHF exacerbation, IV diuresis and further evaluation.    Final Clinical Impression(s) / ED Diagnoses Final diagnoses:  None    Rx / DC Orders ED Discharge Orders     None  Lorelle Gibbs, DO 09/23/20 2243

## 2020-09-23 NOTE — Progress Notes (Addendum)
[name] [name], [name] [name] ([id number]) Visit Report for 09/23/2020 Arrival Information Details Patient Name: Date of Service: [name], [place] 09/23/2020 12:45 PM Medical Record Number: [id number] Patient Account Number: [id number] Date of Birth/Sex: Treating RN: [birth year] (55 y.o. [name] [name] Primary Care [name] [name]: [name] [name] Other Clinician: Referring [name] [name]: Treating [name] [name]/Extender: [name] [name] in Treatment: 12 Visit Information History Since Last Visit Added or deleted any medications: No Patient Arrived: Wheel Chair Any new allergies or adverse reactions: No Arrival Time: 13:15 Had a fall or experienced change in No Accompanied By: mother activities of daily living that may affect Transfer Assistance: None risk of falls: Patient Identification Verified: Yes Signs or symptoms of abuse/neglect since No Secondary Verification Process Completed: Yes last visito Patient Requires Transmission-Based Precautions: No Hospitalized since last visit: No Patient Has Alerts: Yes Implantable device outside of the clinic No Patient Alerts: R TBI: 0.56 excluding L TBI: 0.75 cellular tissue based products placed in the center since last visit: Has Dressing in Place as Prescribed: Yes Has Footwear/Offloading in Place as Yes Prescribed: Left: Surgical Shoe with Pressure Relief Insole Right: Surgical Shoe with Pressure Relief Insole Pain Present Now: No Electronic Signature(s) Signed: 09/23/2020 7:10:16 PM By: Baruch Gouty RN, BSN Entered By: Baruch Gouty on 09/23/2020 13:24:14 -------------------------------------------------------------------------------- Clinic Level of Care Assessment Details Patient Name: Date of Service: [name] [name], [name] 09/23/2020 12:45 PM Medical Record Number: [id number] Patient Account Number: [id number] Date of Birth/Sex: Treating RN: 04/21/1965 (55 y.o. [name] [name], [name] [name] Primary Care [name] [name]: [name] [name]  Other Clinician: Referring [name] [name]: Treating [name] [name]/Extender: [name] [name] in Treatment: 12 Clinic Level of Care Assessment Items TOOL 4 Quantity Score X- 1 0 Use when only an EandM is performed on FOLLOW-UP visit ASSESSMENTS - Nursing Assessment / Reassessment X- 1 10 Reassessment of Co-morbidities (includes updates in patient status) X- 1 5 Reassessment of Adherence to Treatment Plan ASSESSMENTS - Wound and Skin A ssessment / Reassessment '[]'$  - 0 Simple Wound Assessment / Reassessment - one wound X- 6 5 Complex Wound Assessment / Reassessment - multiple wounds X- 1 10 Dermatologic / Skin Assessment (not related to wound area) ASSESSMENTS - Focused Assessment X- 2 5 Circumferential Edema Measurements - multi extremities X- 1 10 Nutritional Assessment / Counseling / Intervention '[]'$  - 0 Lower Extremity Assessment (monofilament, tuning fork, pulses) '[]'$  - 0 Peripheral Arterial Disease Assessment (using hand held doppler) ASSESSMENTS - Ostomy and/or Continence Assessment and Care '[]'$  - 0 Incontinence Assessment and Management '[]'$  - 0 Ostomy Care Assessment and Management (repouching, etc.) PROCESS - Coordination of Care '[]'$  - 0 Simple Patient / Family Education for ongoing care X- 1 20 Complex (extensive) Patient / Family Education for ongoing care X- 1 10 Staff obtains Programmer, systems, Records, T Results / Process Orders est X- 1 10 Staff telephones HHA, Nursing Homes / Clarify orders / etc '[]'$  - 0 Routine Transfer to another Facility (non-emergent condition) '[]'$  - 0 Routine Hospital Admission (non-emergent condition) '[]'$  - 0 New Admissions / Biomedical engineer / Ordering NPWT Apligraf, etc. , '[]'$  - 0 Emergency Hospital Admission (emergent condition) '[]'$  - 0 Simple Discharge Coordination X- 1 15 Complex (extensive) Discharge Coordination PROCESS - Special Needs '[]'$  - 0 Pediatric / Minor Patient Management '[]'$  - 0 Isolation Patient  Management '[]'$  - 0 Hearing / Language / Visual special needs '[]'$  - 0 Assessment of Community assistance (transportation, D/C planning, etc.) '[]'$  - 0 Additional assistance / Altered mentation '[]'$  - 0 Support Surface(s)  Assessment (bed, cushion, seat, etc.) INTERVENTIONS - Wound Cleansing / Measurement '[]'$  - 0 Simple Wound Cleansing - one wound X- 6 5 Complex Wound Cleansing - multiple wounds X- 1 5 Wound Imaging (photographs - any number of wounds) '[]'$  - 0 Wound Tracing (instead of photographs) '[]'$  - 0 Simple Wound Measurement - one wound X- 6 5 Complex Wound Measurement - multiple wounds INTERVENTIONS - Wound Dressings X - Small Wound Dressing one or multiple wounds 4 10 '[]'$  - 0 Medium Wound Dressing one or multiple wounds X- 2 20 Large Wound Dressing one or multiple wounds '[]'$  - 0 Application of Medications - topical '[]'$  - 0 Application of Medications - injection INTERVENTIONS - Miscellaneous '[]'$  - 0 External ear exam '[]'$  - 0 Specimen Collection (cultures, biopsies, blood, body fluids, etc.) '[]'$  - 0 Specimen(s) / Culture(s) sent or taken to Lab for analysis '[]'$  - 0 Patient Transfer (multiple staff / Civil Service fast streamer / Similar devices) '[]'$  - 0 Simple Staple / Suture removal (25 or less) '[]'$  - 0 Complex Staple / Suture removal (26 or more) '[]'$  - 0 Hypo / Hyperglycemic Management (close monitor of Blood Glucose) '[]'$  - 0 Ankle / Brachial Index (ABI) - do not check if billed separately X- 1 5 Vital Signs Has the patient been seen at the hospital within the last three years: Yes Total Score: 280 Level Of Care: New/Established - Level 5 Electronic Signature(s) Signed: 09/23/2020 6:55:28 PM By: [name] [name] Entered By: [name] [name] on 09/23/2020 18:26:43 -------------------------------------------------------------------------------- Complex / Palliative Patient Assessment Details Patient Name: Date of Service: [name] Mocha, MA [name] L. 09/23/2020 12:45 PM Medical Record Number:  [id number] Patient Account Number: [id number] Date of Birth/Sex: Treating RN: [birth year] (55 y.o. [name] [name] Primary Care Audrielle Vankuren: [name] [name] Other Clinician: Referring Rayhaan Huster: Treating Baxter Gonzalez/Extender: [name] [name] in Treatment: 12 Palliative Management Criteria Complex Wound Management Criteria Patient has remarkable or complex co-morbidities requiring medications or treatments that extend wound healing times. Examples: Diabetes mellitus with chronic renal failure or end stage renal disease requiring dialysis Advanced or poorly controlled rheumatoid arthritis Diabetes mellitus and end stage chronic obstructive pulmonary disease Active cancer with current chemo- or radiation therapy bil BKA for osteo and sepsis, ESRD, DM Care Approach Wound Care Plan: Complex Wound Management Electronic Signature(s) Signed: 10/13/2020 5:31:12 PM By: Kalman Shan DO Signed: 12/18/2020 3:07:17 PM By: Baruch Gouty RN, BSN Entered By: Baruch Gouty on 10/13/2020 16:01:47 -------------------------------------------------------------------------------- Lower Extremity Assessment Details Patient Name: Date of Service: [name] [name], [place] L. 09/23/2020 12:45 PM Medical Record Number: [id number] Patient Account Number: [id number] Date of Birth/Sex: Treating RN: 1965-07-09 (55 y.o. [name] [name] Primary Care Mallarie Voorhies: [name] [name] Other Clinician: Referring Danijah Noh: Treating Montzerrat Brunell/Extender: [name] [name] in Treatment: 12 Edema Assessment Assessed: Shirlyn Goltz: No] Patrice Paradise: No] Edema: [Left: Yes] [Right: Yes] Calf Left: Right: Point of Measurement: 30 cm From Medial Instep 49.5 cm 48.5 cm Ankle Left: Right: Point of Measurement: 10 cm From Medial Instep 26.6 cm 25.5 cm Vascular Assessment Pulses: Dorsalis Pedis Palpable: [Left:No] [Right:No] Electronic Signature(s) Signed: 09/23/2020 7:10:16 PM By: Baruch Gouty  RN, BSN Entered By: Baruch Gouty on 09/23/2020 14:03:40 -------------------------------------------------------------------------------- Multi Wound Chart Details Patient Name: Date of Service: [name] Mocha, MA [place] L. 09/23/2020 12:45 PM Medical Record Number: [id number] Patient Account Number: [id number] Date of Birth/Sex: Treating RN: Nov 21, 1965 (55 y.o. Hessie Diener Primary Care Kyung Muto: [name] [name] Other Clinician: Referring Rashiya Lofland: Treating Tianni Escamilla/Extender: [name] [name] in Treatment: 12 Vital Signs  Height(in): 65 Capillary Blood Glucose(mg/dl): 136 Weight(lbs): 240 Pulse(bpm): 118 Body Mass Index(BMI): 40 Blood Pressure(mmHg): 140/96 Temperature(F): 98.1 Respiratory Rate(breaths/min): 20 Photos: [2:No Photos Right Calcaneus] [3:No Photos Left Calcaneus] [5:No Photos Left Gluteal fold] Wound Location: [2:Pressure Injury] [3:Pressure Injury] [5:Shear/Friction] Wounding Event: [2:Diabetic Wound/Ulcer of the Lower] [3:Diabetic Wound/Ulcer of the Lower] [5:Pressure Ulcer] Primary Etiology: [2:Extremity Anemia, Congestive Heart Failure,] [3:Extremity Anemia, Congestive Heart Failure,] [5:Anemia, Congestive Heart Failure,] Comorbid History: [2:Hypertension, Type II Diabetes, End Stage Renal Disease, Raynauds, Neuropathy 04/22/2020] [3:Hypertension, Type II Diabetes, End Stage Renal Disease, Raynauds, Neuropathy 04/22/2020] [5:Hypertension, Type II Diabetes, End Stage Renal  Disease, Raynauds, Neuropathy 08/22/2020] Date Acquired: [2:12] [3:12] [5:4] Weeks of Treatment: [2:Open] [3:Open] [5:Open] Wound Status: [2:No] [3:No] [5:Yes] Clustered Wound: [2:11x6.7x1.2] [3:7.9x6.5x0.5] [5:2x1.1x0.1] Measurements L x W x D (cm) [2:57.884] [3:40.33] [5:1.728] A (cm) : rea [2:69.461] [3:20.165] [5:0.173] Volume (cm) : [2:-445.90%] [3:-112.20%] [5:45.00%] % Reduction in A rea: [2:-3174.90%] [3:-17.90%] [5:44.90%] % Reduction in Volume: [2:Grade 2]  [3:Grade 2] [5:Category/Stage II] Classification: [2:Large] [3:Large] [5:Small] Exudate A mount: [2:Purulent] [3:Purulent] [5:Serosanguineous] Exudate Type: [2:yellow, brown, green] [3:yellow, brown, green] [5:red, brown] Exudate Color: [2:Yes] [3:Yes] [5:No] Foul Odor A Cleansing: [2:fter No] [3:No] [5:N/A] Odor A nticipated Due to Product Use: [2:Well defined, not attached] [3:Well defined, not attached] [5:Distinct, outline attached] Wound Margin: [2:Small (1-33%)] [3:None Present (0%)] [5:Large (67-100%)] Granulation A mount: [2:Pink] [3:N/A] [5:Red, Pink] Granulation Quality: [2:Large (67-100%)] [3:Medium (34-66%)] [5:None Present (0%)] Necrotic A mount: [2:Adherent Slough] [3:Adherent Slough] [5:N/A] Necrotic Tissue: [2:Fat Layer (Subcutaneous Tissue): Yes Fat Layer (Subcutaneous Tissue): Yes Fat Layer (Subcutaneous Tissue): Yes] Exposed Structures: [2:Bone: Yes Fascia: No Tendon: No Muscle: No Joint: No None] [3:Fascia: No Tendon: No Muscle: No Joint: No Bone: No None] [5:Fascia: No Tendon: No Muscle: No Joint: No Bone: No Medium (34-66%)] Wound Number: '6 7 8 '$ Photos: No Photos No Photos No Photos Sacrum Left Gluteus Right, Distal Back Wound Location: Gradually Appeared Gradually Appeared Pressure Injury Wounding Event: Pressure Ulcer T be determined o Pressure Ulcer Primary Etiology: Anemia, Congestive Heart Failure, Anemia, Congestive Heart Failure, Anemia, Congestive Heart Failure, Comorbid History: Hypertension, Type II Diabetes, End Hypertension, Type II Diabetes, End Hypertension, Type II Diabetes, End Stage Renal Disease, Raynauds, Stage Renal Disease, Raynauds, Stage Renal Disease, Raynauds, Neuropathy Neuropathy Neuropathy 09/15/2020 09/15/2020 09/15/2020 Date Acquired: '1 1 1 '$ Weeks of Treatment: Open Open Open Wound Status: No No No Clustered Wound: 1.6x4.9x0.1 1x1.2x0.1 2.1x1.5x0.1 Measurements L x W x D (cm) 6.158 0.942 2.474 A (cm) : rea 0.616 0.094  0.247 Volume (cm) : -1861.10% -185.50% -160.40% % Reduction in Area: -1887.10% -184.80% -160.00% % Reduction in Volume: Category/Stage II Full Thickness Without Exposed Unstageable/Unclassified Classification: Support Structures Medium Medium Small Exudate A mount: Serosanguineous Serous Serosanguineous Exudate Type: red, brown amber red, brown Exudate Color: No No No Foul Odor A Cleansing: fter N/A N/A N/A Odor Anticipated Due to Product Use: Indistinct, nonvisible Distinct, outline attached Distinct, outline attached Wound Margin: None Present (0%) None Present (0%) None Present (0%) Granulation A mount: N/A N/A N/A Granulation Quality: Large (67-100%) Large (67-100%) Large (67-100%) Necrotic Amount: Eschar, Adherent Slough Adherent Slough Eschar Necrotic Tissue: Fat Layer (Subcutaneous Tissue): Yes Fat Layer (Subcutaneous Tissue): Yes Fat Layer (Subcutaneous Tissue): Yes Exposed Structures: Fascia: No Fascia: No Tendon: No Tendon: No Muscle: No Muscle: No Joint: No Joint: No Bone: No Bone: No None None Small (1-33%) Epithelialization: Treatment Notes Electronic Signature(s) Signed: 09/23/2020 5:21:14 PM By: Kalman Shan DO Signed: 09/23/2020 6:55:28 PM By: [name] [name] Entered  By: Kalman Shan on 09/23/2020 15:07:35 -------------------------------------------------------------------------------- Multi-Disciplinary Care Plan Details Patient Name: Date of Service: [place], [place] 09/23/2020 12:45 PM Medical Record Number: [id number] Patient Account Number: [id number] Date of Birth/Sex: Treating RN: [birth year] (55 y.o. Hessie Diener Primary Care Tehani Mersman: [name] [name] Other Clinician: Referring Emry Barbato: Treating Leala Bryand/Extender: [name] [name] in Treatment: [place] reviewed with physician Active Inactive Electronic Signature(s) Signed: 12/18/2020 3:07:17 PM By: Baruch Gouty RN,  BSN Signed: 12/18/2020 5:44:11 PM By: [name] Pilling RN, BSN Previous Signature: 09/23/2020 6:55:28 PM Version By: [name] [name] Entered By: Baruch Gouty on 11/14/2020 08:15:48 -------------------------------------------------------------------------------- Pain Assessment Details Patient Name: Date of Service: [name] Mocha, MA [employer] L. 09/23/2020 12:45 PM Medical Record Number: [id number] Patient Account Number: [id number] Date of Birth/Sex: Treating RN: 1965/11/23 (55 y.o. [name] [name] Primary Care Mihira Tozzi: [name] [name] Other Clinician: Referring Hae Ahlers: Treating Jacorey Donaway/Extender: [name] [name] in Treatment: 12 Active Problems Location of Pain Severity and Description of Pain Patient Has Paino Yes Site Locations Pain Location: Pain in Ulcers With Dressing Change: Yes Duration of the Pain. Constant / Intermittento Intermittent Rate the pain. Current Pain Level: 5 Character of Pain Describe the Pain: Aching Pain Management and Medication Current Pain Management: Medication: Yes Is the Current Pain Management Adequate: Adequate How does your wound impact your activities of daily livingo Sleep: Yes Bathing: No Appetite: No Relationship With Others: No Bladder Continence: No Emotions: Yes Bowel Continence: No Work: No Toileting: No Drive: No Dressing: No Hobbies: No Electronic Signature(s) Signed: 09/23/2020 7:10:16 PM By: Baruch Gouty RN, BSN Entered By: Baruch Gouty on 09/23/2020 14:02:58 -------------------------------------------------------------------------------- Patient/Caregiver Education Details Patient Name: Date of Service: [name] Mocha, MA RK Carlean Jews 7/5/2022andnbsp12:45 PM Medical Record Number: [id number] Patient Account Number: [id number] Date of Birth/Gender: Treating RN: March 21, 1966 (55 y.o. Hessie Diener Primary Care Physician: [name] [name] Other Clinician: Referring Physician: Treating  Physician/Extender: [name] [name] in Treatment: 12 Education Assessment Education Provided To: Patient Education Topics Provided Elevated Blood Sugar/ Impact on Healing: Handouts: Elevated Blood Sugars: How Do They Affect Wound Healing Methods: Explain/Verbal Responses: Reinforcements needed Electronic Signature(s) Signed: 09/23/2020 6:55:28 PM By: [name] [name] Entered By: [name] [name] on 09/23/2020 14:16:27 -------------------------------------------------------------------------------- Wound Assessment Details Patient Name: Date of Service: [name] [name], [place] [name] 09/23/2020 12:45 PM Medical Record Number: [id number] Patient Account Number: [id number] Date of Birth/Sex: Treating RN: Oct 20, 1965 (55 y.o. [name] [name] Primary Care Matteo Banke: [name] [name] Other Clinician: Referring Tykia Mellone: Treating Kecia Swoboda/Extender: [name] [name] in Treatment: 12 Wound Status Wound Number: 2 Primary Diabetic Wound/Ulcer of the Lower Extremity Etiology: Wound Location: Right Calcaneus Wound Open Wounding Event: Pressure Injury Status: Date Acquired: 04/22/2020 Comorbid Anemia, Congestive Heart Failure, Hypertension, Type II Diabetes, Weeks Of Treatment: 12 History: End Stage Renal Disease, Raynauds, Neuropathy Clustered Wound: No Photos Wound Measurements Length: (cm) 11 Width: (cm) 6.7 Depth: (cm) 1.2 Area: (cm) 57.884 Volume: (cm) 69.461 % Reduction in Area: -445.9% % Reduction in Volume: -3174.9% Epithelialization: None Tunneling: No Undermining: No Wound Description Classification: Grade 2 Wound Margin: Well defined, not attached Exudate Amount: Large Exudate Type: Purulent Exudate Color: yellow, brown, green Wound Bed Granulation Amount: Small (1-33%) Granulation Quality: Pink Necrotic Amount: Large (67-100%) Necrotic Quality: Adherent Slough Foul Odor After Cleansing: Yes Due to Product Use:  No Slough/Fibrino Yes Exposed Structure Fascia Exposed: No Fat Layer (Subcutaneous Tissue) Exposed: Yes Tendon Exposed: No Muscle Exposed: No Joint Exposed: No Bone Exposed: Yes Electronic Signature(s) Signed: 09/23/2020  5:48:24 PM By: Sandre Kitty Signed: 09/23/2020 7:10:16 PM By: Baruch Gouty RN, BSN Entered By: Sandre Kitty on 09/23/2020 17:23:46 -------------------------------------------------------------------------------- Wound Assessment Details Patient Name: Date of Service: [name] Mocha, MA [place] L. 09/23/2020 12:45 PM Medical Record Number: [id number] Patient Account Number: [id number] Date of Birth/Sex: Treating RN: 02-21-1966 (55 y.o. [name] [name] Primary Care Jodye Scali: [name] [name] Other Clinician: Referring Lanasia Porras: Treating Glennie Rodda/Extender: [name] [name] in Treatment: 12 Wound Status Wound Number: 3 Primary Diabetic Wound/Ulcer of the Lower Extremity Etiology: Wound Location: Left Calcaneus Wound Open Wounding Event: Pressure Injury Status: Date Acquired: 04/22/2020 Comorbid Anemia, Congestive Heart Failure, Hypertension, Type II Diabetes, Weeks Of Treatment: 12 History: End Stage Renal Disease, Raynauds, Neuropathy Clustered Wound: No Photos Wound Measurements Length: (cm) 7.9 Width: (cm) 6.5 Depth: (cm) 0.5 Area: (cm) 40.33 Volume: (cm) 20.165 % Reduction in Area: -112.2% % Reduction in Volume: -17.9% Epithelialization: None Tunneling: No Undermining: No Wound Description Classification: Grade 2 Wound Margin: Well defined, not attached Exudate Amount: Large Exudate Type: Purulent Exudate Color: yellow, brown, green Foul Odor After Cleansing: Yes Due to Product Use: No Slough/Fibrino Yes Wound Bed Granulation Amount: None Present (0%) Exposed Structure Necrotic Amount: Medium (34-66%) Fascia Exposed: No Necrotic Quality: Adherent Slough Fat Layer (Subcutaneous Tissue) Exposed: Yes Tendon  Exposed: No Muscle Exposed: No Joint Exposed: No Bone Exposed: No Electronic Signature(s) Signed: 09/23/2020 5:48:24 PM By: Sandre Kitty Signed: 09/23/2020 7:10:16 PM By: Baruch Gouty RN, BSN Entered By: Sandre Kitty on 09/23/2020 17:22:27 -------------------------------------------------------------------------------- Wound Assessment Details Patient Name: Date of Service: [name] Mocha, MA [place] L. 09/23/2020 12:45 PM Medical Record Number: [id number] Patient Account Number: [id number] Date of Birth/Sex: Treating RN: 1965/12/10 (54 y.o. [name] [name] Primary Care Jireh Vinas: [name] [name] Other Clinician: Referring Amour Cutrone: Treating [place] Pettengill/Extender: [name] [name] in Treatment: 12 Wound Status Wound Number: 5 Primary Pressure Ulcer Etiology: Wound Location: Left Gluteal fold Wound Open Wounding Event: Shear/Friction Status: Date Acquired: 08/22/2020 Comorbid Anemia, Congestive Heart Failure, Hypertension, Type II Diabetes, Weeks Of Treatment: 4 History: End Stage Renal Disease, Raynauds, Neuropathy Clustered Wound: Yes Photos Wound Measurements Length: (cm) 2 Width: (cm) 1.1 Depth: (cm) 0.1 Area: (cm) 1.728 Volume: (cm) 0.173 % Reduction in Area: 45% % Reduction in Volume: 44.9% Epithelialization: Medium (34-66%) Tunneling: No Undermining: No Wound Description Classification: Category/Stage II Wound Margin: Distinct, outline attached Exudate Amount: Small Exudate Type: Serosanguineous Exudate Color: red, brown Foul Odor After Cleansing: No Slough/Fibrino No Wound Bed Granulation Amount: Large (67-100%) Exposed Structure Granulation Quality: Red, Pink Fascia Exposed: No Necrotic Amount: None Present (0%) Fat Layer (Subcutaneous Tissue) Exposed: Yes Tendon Exposed: No Muscle Exposed: No Joint Exposed: No Bone Exposed: No Electronic Signature(s) Signed: 09/23/2020 5:48:24 PM By: Sandre Kitty Signed: 09/23/2020 7:10:16  PM By: Baruch Gouty RN, BSN Signed: 09/23/2020 7:10:16 PM By: Baruch Gouty RN, BSN Entered By: Sandre Kitty on 09/23/2020 17:27:33 -------------------------------------------------------------------------------- Wound Assessment Details Patient Name: Date of Service: [name] Mocha, MA [place] L. 09/23/2020 12:45 PM Medical Record Number: [id number] Patient Account Number: [id number] Date of Birth/Sex: Treating RN: May 04, 1965 (55 y.o. [name] [name] Primary Care Elaijah Munoz: [name] [name] Other Clinician: Referring Arzu Mcgaughey: Treating Malachi Suderman/Extender: [name] [name] in Treatment: 12 Wound Status Wound Number: 6 Primary Pressure Ulcer Etiology: Wound Location: Sacrum Wound Open Wounding Event: Gradually Appeared Status: Date Acquired: 09/15/2020 Comorbid Anemia, Congestive Heart Failure, Hypertension, Type II Diabetes, Weeks Of Treatment: 1 History: End Stage Renal Disease, Raynauds, Neuropathy Clustered Wound: No Photos Wound Measurements Length: (cm) 1.6  Width: (cm) 4.9 Depth: (cm) 0.1 Area: (cm) 6.158 Volume: (cm) 0.616 % Reduction in Area: -1861.1% % Reduction in Volume: -1887.1% Epithelialization: None Tunneling: No Undermining: No Wound Description Classification: Category/Stage II Wound Margin: Indistinct, nonvisible Exudate Amount: Medium Exudate Type: Serosanguineous Exudate Color: red, brown Foul Odor After Cleansing: No Slough/Fibrino No Wound Bed Granulation Amount: None Present (0%) Exposed Structure Necrotic Amount: Large (67-100%) Fascia Exposed: No Necrotic Quality: Eschar, Adherent Slough Fat Layer (Subcutaneous Tissue) Exposed: Yes Tendon Exposed: No Muscle Exposed: No Joint Exposed: No Bone Exposed: No Electronic Signature(s) Signed: 09/23/2020 5:48:24 PM By: Sandre Kitty Signed: 09/23/2020 7:10:16 PM By: Baruch Gouty RN, BSN Entered By: Sandre Kitty on 09/23/2020  17:25:57 -------------------------------------------------------------------------------- Wound Assessment Details Patient Name: Date of Service: [name] Mocha, MA [name] 09/23/2020 12:45 PM Medical Record Number: [id number] Patient Account Number: [id number] Date of Birth/Sex: Treating RN: [birth year] (55 y.o. [name] [name] Primary Care Jailani Hogans: [name] [name] Other Clinician: Referring Da Michelle: Treating Edson Deridder/Extender: [name] [name] in Treatment: 12 Wound Status Wound Number: 7 Primary T be determined o Etiology: Wound Location: Left Gluteus Wound Open Wounding Event: Gradually Appeared Status: Date Acquired: 09/15/2020 Comorbid Anemia, Congestive Heart Failure, Hypertension, Type II Diabetes, Weeks Of Treatment: 1 History: End Stage Renal Disease, Raynauds, Neuropathy Clustered Wound: No Photos Wound Measurements Length: (cm) 1 Width: (cm) 1.2 Depth: (cm) 0.1 Area: (cm) 0.942 Volume: (cm) 0.094 % Reduction in Area: -185.5% % Reduction in Volume: -184.8% Epithelialization: None Tunneling: No Undermining: No Wound Description Classification: Full Thickness Without Exposed Support Structures Wound Margin: Distinct, outline attached Exudate Amount: Medium Exudate Type: Serous Exudate Color: amber Foul Odor After Cleansing: No Slough/Fibrino Yes Wound Bed Granulation Amount: None Present (0%) Exposed Structure Necrotic Amount: Large (67-100%) Fascia Exposed: No Necrotic Quality: Adherent Slough Fat Layer (Subcutaneous Tissue) Exposed: Yes Tendon Exposed: No Muscle Exposed: No Joint Exposed: No Bone Exposed: No Electronic Signature(s) Signed: 09/23/2020 5:48:24 PM By: Sandre Kitty Signed: 09/23/2020 7:10:16 PM By: Baruch Gouty RN, BSN Entered By: Sandre Kitty on 09/23/2020 17:27:09 -------------------------------------------------------------------------------- Wound Assessment Details Patient Name: Date of  Service: [name] Mocha, MA [place] L. 09/23/2020 12:45 PM Medical Record Number: [id number] Patient Account Number: [id number] Date of Birth/Sex: Treating RN: 1965-12-14 (55 y.o. [name] [name] Primary Care Harpreet Pompey: [name] [name] Other Clinician: Referring See Beharry: Treating Shekera Beavers/Extender: [name] [name] in Treatment: 12 Wound Status Wound Number: 8 Primary Pressure Ulcer Etiology: Wound Location: Right, Distal Back Wound Open Wounding Event: Pressure Injury Status: Date Acquired: 09/15/2020 Comorbid Anemia, Congestive Heart Failure, Hypertension, Type II Diabetes, Weeks Of Treatment: 1 History: End Stage Renal Disease, Raynauds, Neuropathy Clustered Wound: No Photos Wound Measurements Length: (cm) 2.1 Width: (cm) 1.5 Depth: (cm) 0.1 Area: (cm) 2.474 Volume: (cm) 0.247 % Reduction in Area: -160.4% % Reduction in Volume: -160% Epithelialization: Small (1-33%) Tunneling: No Undermining: No Wound Description Classification: Unstageable/Unclassified Wound Margin: Distinct, outline attached Exudate Amount: Small Exudate Type: Serosanguineous Exudate Color: red, brown Foul Odor After Cleansing: No Slough/Fibrino Yes Wound Bed Granulation Amount: None Present (0%) Exposed Structure Necrotic Amount: Large (67-100%) Fat Layer (Subcutaneous Tissue) Exposed: Yes Necrotic Quality: Eschar Electronic Signature(s) Signed: 09/23/2020 5:48:24 PM By: Sandre Kitty Signed: 09/23/2020 7:10:16 PM By: Baruch Gouty RN, BSN Entered By: Sandre Kitty on 09/23/2020 17:24:58 -------------------------------------------------------------------------------- Vitals Details Patient Name: Date of Service: [name] Mocha, MA [place] L. 09/23/2020 12:45 PM Medical Record Number: [id number] Patient Account Number: [id number] Date of Birth/Sex: Treating RN: 1966-02-06 (56 y.o. [name] [name] Primary Care Lemuel Boodram: [name] [name]  Other Clinician: Referring  Ahlam Piscitelli: Treating Malayia Spizzirri/Extender: [name] [name] in Treatment: 12 Vital Signs Time Taken: 13:24 Temperature (F): 98.1 Height (in): 65 Pulse (bpm): 118 Source: Stated Respiratory Rate (breaths/min): 20 Weight (lbs): 240 Blood Pressure (mmHg): 140/96 Source: Stated Capillary Blood Glucose (mg/dl): 136 Body Mass Index (BMI): 39.9 Reference Range: 80 - 120 mg / dl Notes glucose per pt report this am Electronic Signature(s) Signed: 09/23/2020 7:10:16 PM By: Baruch Gouty RN, BSN Entered By: Baruch Gouty on 09/23/2020 13:26:07

## 2020-09-24 ENCOUNTER — Encounter (HOSPITAL_COMMUNITY): Payer: Self-pay | Admitting: Internal Medicine

## 2020-09-24 ENCOUNTER — Observation Stay (HOSPITAL_COMMUNITY): Payer: Medicare Other

## 2020-09-24 ENCOUNTER — Inpatient Hospital Stay (HOSPITAL_COMMUNITY)
Admission: RE | Admit: 2020-09-24 | Discharge: 2020-09-24 | Disposition: A | Discharge status: Home / Self Care | Payer: Commercial Managed Care - HMO | Source: Ambulatory Visit

## 2020-09-24 ENCOUNTER — Inpatient Hospital Stay (HOSPITAL_COMMUNITY): Payer: Medicare Other

## 2020-09-24 DIAGNOSIS — I509 Heart failure, unspecified: Secondary | ICD-10-CM | POA: Diagnosis not present

## 2020-09-24 DIAGNOSIS — N250 Renal osteodystrophy: Secondary | ICD-10-CM | POA: Diagnosis not present

## 2020-09-24 DIAGNOSIS — R0600 Dyspnea, unspecified: Secondary | ICD-10-CM | POA: Diagnosis not present

## 2020-09-24 DIAGNOSIS — I5020 Unspecified systolic (congestive) heart failure: Secondary | ICD-10-CM | POA: Diagnosis not present

## 2020-09-24 DIAGNOSIS — E1169 Type 2 diabetes mellitus with other specified complication: Secondary | ICD-10-CM | POA: Diagnosis present

## 2020-09-24 DIAGNOSIS — L97429 Non-pressure chronic ulcer of left heel and midfoot with unspecified severity: Secondary | ICD-10-CM | POA: Diagnosis not present

## 2020-09-24 DIAGNOSIS — M86172 Other acute osteomyelitis, left ankle and foot: Secondary | ICD-10-CM | POA: Diagnosis not present

## 2020-09-24 DIAGNOSIS — E1122 Type 2 diabetes mellitus with diabetic chronic kidney disease: Secondary | ICD-10-CM | POA: Diagnosis not present

## 2020-09-24 DIAGNOSIS — Z992 Dependence on renal dialysis: Secondary | ICD-10-CM | POA: Diagnosis not present

## 2020-09-24 DIAGNOSIS — M7989 Other specified soft tissue disorders: Secondary | ICD-10-CM | POA: Diagnosis not present

## 2020-09-24 DIAGNOSIS — I10 Essential (primary) hypertension: Secondary | ICD-10-CM

## 2020-09-24 DIAGNOSIS — Y830 Surgical operation with transplant of whole organ as the cause of abnormal reaction of the patient, or of later complication, without mention of misadventure at the time of the procedure: Secondary | ICD-10-CM | POA: Diagnosis present

## 2020-09-24 DIAGNOSIS — Z6834 Body mass index (BMI) 34.0-34.9, adult: Secondary | ICD-10-CM | POA: Diagnosis not present

## 2020-09-24 DIAGNOSIS — D631 Anemia in chronic kidney disease: Secondary | ICD-10-CM | POA: Diagnosis not present

## 2020-09-24 DIAGNOSIS — M810 Age-related osteoporosis without current pathological fracture: Secondary | ICD-10-CM | POA: Diagnosis not present

## 2020-09-24 DIAGNOSIS — Z872 Personal history of diseases of the skin and subcutaneous tissue: Secondary | ICD-10-CM | POA: Diagnosis not present

## 2020-09-24 DIAGNOSIS — E8770 Fluid overload, unspecified: Secondary | ICD-10-CM | POA: Diagnosis not present

## 2020-09-24 DIAGNOSIS — S91301A Unspecified open wound, right foot, initial encounter: Secondary | ICD-10-CM | POA: Diagnosis not present

## 2020-09-24 DIAGNOSIS — N186 End stage renal disease: Secondary | ICD-10-CM | POA: Diagnosis not present

## 2020-09-24 DIAGNOSIS — L89624 Pressure ulcer of left heel, stage 4: Secondary | ICD-10-CM | POA: Diagnosis present

## 2020-09-24 DIAGNOSIS — E1142 Type 2 diabetes mellitus with diabetic polyneuropathy: Secondary | ICD-10-CM | POA: Diagnosis present

## 2020-09-24 DIAGNOSIS — E785 Hyperlipidemia, unspecified: Secondary | ICD-10-CM

## 2020-09-24 DIAGNOSIS — Z20822 Contact with and (suspected) exposure to covid-19: Secondary | ICD-10-CM | POA: Diagnosis present

## 2020-09-24 DIAGNOSIS — E1129 Type 2 diabetes mellitus with other diabetic kidney complication: Secondary | ICD-10-CM | POA: Diagnosis not present

## 2020-09-24 DIAGNOSIS — L8990 Pressure ulcer of unspecified site, unspecified stage: Secondary | ICD-10-CM | POA: Insufficient documentation

## 2020-09-24 DIAGNOSIS — M86171 Other acute osteomyelitis, right ankle and foot: Secondary | ICD-10-CM | POA: Diagnosis not present

## 2020-09-24 DIAGNOSIS — I5043 Acute on chronic combined systolic (congestive) and diastolic (congestive) heart failure: Secondary | ICD-10-CM | POA: Diagnosis present

## 2020-09-24 DIAGNOSIS — F32A Depression, unspecified: Secondary | ICD-10-CM | POA: Diagnosis present

## 2020-09-24 DIAGNOSIS — E8779 Other fluid overload: Secondary | ICD-10-CM | POA: Diagnosis not present

## 2020-09-24 DIAGNOSIS — E119 Type 2 diabetes mellitus without complications: Secondary | ICD-10-CM | POA: Diagnosis not present

## 2020-09-24 DIAGNOSIS — I5021 Acute systolic (congestive) heart failure: Secondary | ICD-10-CM | POA: Diagnosis not present

## 2020-09-24 DIAGNOSIS — I255 Ischemic cardiomyopathy: Secondary | ICD-10-CM | POA: Diagnosis present

## 2020-09-24 DIAGNOSIS — Z794 Long term (current) use of insulin: Secondary | ICD-10-CM | POA: Diagnosis not present

## 2020-09-24 DIAGNOSIS — E1151 Type 2 diabetes mellitus with diabetic peripheral angiopathy without gangrene: Secondary | ICD-10-CM | POA: Diagnosis present

## 2020-09-24 DIAGNOSIS — T8612 Kidney transplant failure: Secondary | ICD-10-CM | POA: Diagnosis present

## 2020-09-24 DIAGNOSIS — L89153 Pressure ulcer of sacral region, stage 3: Secondary | ICD-10-CM | POA: Diagnosis not present

## 2020-09-24 DIAGNOSIS — I742 Embolism and thrombosis of arteries of the upper extremities: Secondary | ICD-10-CM | POA: Diagnosis present

## 2020-09-24 DIAGNOSIS — R600 Localized edema: Secondary | ICD-10-CM | POA: Diagnosis not present

## 2020-09-24 DIAGNOSIS — E11621 Type 2 diabetes mellitus with foot ulcer: Secondary | ICD-10-CM | POA: Diagnosis present

## 2020-09-24 DIAGNOSIS — I5023 Acute on chronic systolic (congestive) heart failure: Secondary | ICD-10-CM

## 2020-09-24 DIAGNOSIS — Z66 Do not resuscitate: Secondary | ICD-10-CM | POA: Diagnosis present

## 2020-09-24 DIAGNOSIS — L89614 Pressure ulcer of right heel, stage 4: Secondary | ICD-10-CM | POA: Diagnosis present

## 2020-09-24 DIAGNOSIS — N2581 Secondary hyperparathyroidism of renal origin: Secondary | ICD-10-CM | POA: Diagnosis not present

## 2020-09-24 DIAGNOSIS — I132 Hypertensive heart and chronic kidney disease with heart failure and with stage 5 chronic kidney disease, or end stage renal disease: Secondary | ICD-10-CM | POA: Diagnosis not present

## 2020-09-24 LAB — COMPREHENSIVE METABOLIC PANEL
ALT: 9 U/L (ref 0–44)
AST: 8 U/L — ABNORMAL LOW (ref 15–41)
Albumin: 1.8 g/dL — ABNORMAL LOW (ref 3.5–5.0)
Alkaline Phosphatase: 291 U/L — ABNORMAL HIGH (ref 38–126)
Anion gap: 13 (ref 5–15)
BUN: 20 mg/dL (ref 6–20)
CO2: 25 mmol/L (ref 22–32)
Calcium: 8.7 mg/dL — ABNORMAL LOW (ref 8.9–10.3)
Chloride: 97 mmol/L — ABNORMAL LOW (ref 98–111)
Creatinine, Ser: 4.66 mg/dL — ABNORMAL HIGH (ref 0.61–1.24)
GFR, Estimated: 14 mL/min — ABNORMAL LOW (ref >60)
Glucose, Bld: 166 mg/dL — ABNORMAL HIGH (ref 70–99)
Potassium: 4 mmol/L (ref 3.5–5.1)
Sodium: 135 mmol/L (ref 135–145)
Total Bilirubin: 1.3 mg/dL — ABNORMAL HIGH (ref 0.3–1.2)
Total Protein: 6.2 g/dL — ABNORMAL LOW (ref 6.5–8.1)

## 2020-09-24 LAB — CBC
HCT: 33.5 % — ABNORMAL LOW (ref 39.0–52.0)
Hemoglobin: 9.9 g/dL — ABNORMAL LOW (ref 13.0–17.0)
MCH: 28.9 pg (ref 26.0–34.0)
MCHC: 29.6 g/dL — ABNORMAL LOW (ref 30.0–36.0)
MCV: 98 fL (ref 80.0–100.0)
Platelets: 339 10*3/uL (ref 150–400)
RBC: 3.42 MIL/uL — ABNORMAL LOW (ref 4.22–5.81)
RDW: 17.6 % — ABNORMAL HIGH (ref 11.5–15.5)
WBC: 9.2 10*3/uL (ref 4.0–10.5)
nRBC: 0 % (ref 0.0–0.2)

## 2020-09-24 LAB — ECHOCARDIOGRAM COMPLETE
Calc EF: 31 %
Height: 65 in
S' Lateral: 4.6 cm
Single Plane A2C EF: 26.1 %
Single Plane A4C EF: 35.5 %
Weight: 3597.91 oz

## 2020-09-24 LAB — MAGNESIUM: Magnesium: 2.2 mg/dL (ref 1.7–2.4)

## 2020-09-24 LAB — HEMOGLOBIN A1C
Hgb A1c MFr Bld: 7.7 % — ABNORMAL HIGH (ref 4.8–5.6)
Mean Plasma Glucose: 174.29 mg/dL

## 2020-09-24 LAB — HEPATITIS B SURFACE ANTIGEN: Hepatitis B Surface Ag: NONREACTIVE

## 2020-09-24 LAB — GLUCOSE, CAPILLARY
Glucose-Capillary: 161 mg/dL — ABNORMAL HIGH (ref 70–99)
Glucose-Capillary: 148 mg/dL — ABNORMAL HIGH (ref 70–99)

## 2020-09-24 LAB — SARS CORONAVIRUS 2 (TAT 6-24 HRS): SARS Coronavirus 2: NEGATIVE

## 2020-09-24 MED ORDER — MIDODRINE HCL 5 MG PO TABS
ORAL_TABLET | ORAL | Status: AC
  Administered 2020-09-24: 5 mg via ORAL
  Filled 2020-09-24: qty 1

## 2020-09-24 MED ORDER — GERHARDT'S BUTT CREAM
TOPICAL_CREAM | Freq: Every day | CUTANEOUS | Status: DC
  Filled 2020-09-24: qty 1

## 2020-09-24 MED ORDER — MIDODRINE HCL 5 MG PO TABS: 5.0000 mg | ORAL_TABLET | Freq: Once | ORAL | Status: AC

## 2020-09-24 MED ORDER — COLLAGENASE 250 UNIT/GM EX OINT
TOPICAL_OINTMENT | Freq: Every day | CUTANEOUS | Status: DC
  Administered 2020-09-24: 1 via TOPICAL
  Filled 2020-09-24 (×2): qty 30

## 2020-09-24 MED ORDER — GERHARDT'S BUTT CREAM
TOPICAL_CREAM | CUTANEOUS | Status: DC | PRN
  Filled 2020-09-24: qty 1

## 2020-09-24 MED ORDER — HEPARIN SODIUM (PORCINE) 1000 UNIT/ML IJ SOLN
INTRAMUSCULAR | Status: AC
  Filled 2020-09-24: qty 5

## 2020-09-24 MED ORDER — INSULIN ASPART 100 UNIT/ML IJ SOLN: 0.0000 [IU] | Freq: Every day | INTRAMUSCULAR | Status: DC

## 2020-09-24 MED ORDER — ALBUMIN HUMAN 25 % IV SOLN
INTRAVENOUS | Status: AC
  Filled 2020-09-24: qty 100

## 2020-09-24 MED ORDER — GABAPENTIN 100 MG PO CAPS
100.0000 mg | ORAL_CAPSULE | Freq: Once | ORAL | Status: AC
  Administered 2020-09-24: 100 mg via ORAL
  Filled 2020-09-24: qty 1

## 2020-09-24 MED ORDER — PERFLUTREN LIPID MICROSPHERE
1.0000 mL | INTRAVENOUS | Status: AC | PRN
  Administered 2020-09-24: 4 mL via INTRAVENOUS
  Filled 2020-09-24: qty 10

## 2020-09-24 MED ORDER — INSULIN ASPART 100 UNIT/ML IJ SOLN
0.0000 [IU] | Freq: Three times a day (TID) | INTRAMUSCULAR | Status: DC
  Administered 2020-09-24: 2 [IU] via SUBCUTANEOUS

## 2020-09-24 NOTE — Progress Notes (Signed)
Pt arrived to unit, skin assessed, and placed on telemetry. Dressings on left and right foot ulcers changed and wound consult ordered. Pt stated that his insulin pump was not connected and pt does not have CGM or replacement pumps with him. Family will bring his supplies tomorrow. MD notified, see new orders.

## 2020-09-24 NOTE — Progress Notes (Signed)
Pt refusing finger sticks for blood sugar checks. Pt wants to use freestlye libre,. Pt educated and informed. MD notified. Freestyle libre reading this evening of 107.

## 2020-09-24 NOTE — Consult Note (Signed)
[facility] Nurse Consult Note: Reason for Consult:Bilateral unstageable heel pressure injuries.  Nurse (WTA) and tech at bedside assessing wounds when I come in. Moisture associated skin damage to sacrum and upper buttocks with stage 2 pressure injuries as well.  Bilateral leg edema Wound type:pressure and moisture in the setting of end stage renal disease and morbid obesity.  Pressure Injury POA: Yes seen ar wound care center yesterday with wounds debrided.  Measurement: Right heel:  11 cm x 7 cm x 1 cm with slough to wound bed Left heel: 8 cm x 7 cm x 1 cm with slough to wound bed. Stage 2 left gluteal: 2 cm x 1 cm x  0.2 cm Right gluteal; 1 cm x 1 cm x 0.3 cm stage 2 Sacrum 1.6 x 5 cm x 0.2 cm stage 2  Patient buttocks frequently moist due to body habitus and perspiration. Wound bed: heels are 100% slough Sacral/gluteal wounds are pink and moist Drainage (amount, consistency, odor) moderate serosanguinous  heels with musty odor.  Periwound: maceration to buttocks and sacral periwound, extending to posterior thighs LEgs with edema and chronic skin changes.  Dressing procedure/placement/frequency:Cleanse heels with NS and pat dry. Apply Santyl to wound bed, cover with NS moist gauze and ABD/KErlix/tape.  Apply Xeroform gauze to any intact or ruptured blisters. Wrap bilateral lower legs with kerlix and ace bandage.  Change daily.  Cleanse buttocks/thigh wounds and sacral wounds with NS and pat dry.  Apply aquacel Ag (LAWSON # [id number]) to skin breakdown apply Gerhardts butt paste to buttocks on maceration.  Cover open wounds with ABD/tape. Change daily.  Will implement low air loss mattress NO DISPOSABLE BRIEFS OR UNDERPADS UNDER PATIENT.  USE A DERMATHERAPY Underpad with low air loss mattress. This will promote healing and improve skin microclimate.  PRevalon boots to bilateral heels.  Will not follow at this time.  Please re-consult if needed.  Domenic Moras MSN, RN, FNP-BC CWON Wound, Ostomy,  [facility] Nurse Pager [phone]

## 2020-09-24 NOTE — Consult Note (Signed)
[facility] Renal Consultation Note    Indication for Consultation:  Management of ESRD/hemodialysis, anemia, hypertension/volume, and secondary hyperparathyroidism. PCP:  HPI: [name] [name] is a 55 y.o. male with ESRD on HD since 2012 (failed Kid tx 2018 - thrombosed renal vein post-op, s/p Tx nephrectomy), HTN, T2DM, HFrEF (40-45%), Hx several finger tip amputations for gangrene, and B foot/heel and sacral wounds who was admitted with dyspnea.  He reports dyspnea x 1 week - worsening to the point that he presented to the ED last night. He has been attending all his HD sessions, challenged his dry weight at HD yesterday and actually left 2kg under his dry weight without any improvement in symptoms. Denies CP, N/V/D, abdominal pain. Has B heel and a sacral wounds, reports significant pain associated with the sacral wound.  Dialyzes on TTS schedule at [facility]. Full HD yesterday (7/5) and left under prior EDW.  Past Medical History:  Diagnosis Date   Anal infection    posterior anal canal   Anemia, chronic renal failure    Atrophic kidney    BILATERAL   DM type 2 causing ESRD ([place])    Nephrologist-- dr [name] [facility])--  on hemodialysis since June 2012 at  [name]  TTS   Hemodialysis patient [facility])    at [place] on Tues/ Thur/Sat/schedule   Hemorrhoids    Hepatitis B antibody positive    History of pleural effusion    bilateral   Hyperparathyroidism, secondary renal ([place])    Hypertension    Ischemic cardiomyopathy    per echo 07-01-2014  ef 45%   LAFB (left anterior fascicular block)    Peripheral neuropathy    Systolic and diastolic CHF, chronic ([place])    CARDIOLOGIST-  DR Daneen Schick ([place])  AND DR [name] ([name])   Past Surgical History:  Procedure Laterality Date   APPENDECTOMY  09-12-2004   laparotomy w/ drainage peritinitis   AV FISTULA PLACEMENT  02-27-2010   right forearm  (RADIOCEPHALIC)   AV FISTULA REPAIR  10-30-2010   BONE BIOPSY Left 05/14/2020   Procedure: BONE BIOPSY;  Surgeon: Trula Slade, DPM;  Location: [place];  Service: Podiatry;  Laterality: Left;   CARDIOVASCULAR STRESS TEST  10-29-2011   dr Daneen Schick   Low risk scan;  mild perfusion defect seen in the basal inferoseptal, basal inferior and mid inferior regions consistent with an infarct/scar and/or overlying attenuation/  mild to moderate global LVSF,  ef 40-45%   DOBUTAMINE STRESS ECHO  07-23-2012   [name]   abnormal ;  at rest estimated lvef 25-30% and global severe LV hypokinesis ;  no cp during stress and achieved 85% maxium predicted heart rate;  negative stress ECG for inducible ischemia;  estimated lvef with stress 35-40%;  augmentation of wall segments consistant with cardiomyopathy and differential fibrosis   FISTULOTOMY N/A 03/26/2015   Procedure: FISTULOTOMY;  Surgeon: [name], MD;  Location: [facility];  Service: General;  Laterality: N/A;   GRAFT APPLICATION Left 0000000   Procedure: GRAFT APPLICATION;  Surgeon: Trula Slade, DPM;  Location: [place];  Service: Podiatry;  Laterality: Left;   GRAFT APPLICATION Bilateral 0000000   Procedure: GRAFT APPLICATION;  Surgeon: Trula Slade, DPM;  Location: [facility];  Service: Podiatry;  Laterality: Bilateral;   INCISION AND DRAINAGE ABSCESS N/A 03/26/2015   Procedure: ANAL INCISION AND DRAINAGE;  Surgeon: [name], MD;  Location: [facility];  Service: General;  Laterality: N/A;   RETINAL DETACHMENT SURGERY Left 2011   incomplete repair/ needs eye drops to keep pressure down   TEE WITHOUT CARDIOVERSION N/A 10/17/2017   Procedure: TRANSESOPHAGEAL ECHOCARDIOGRAM (TEE);  Surgeon: Josue Hector, MD;  Location: [facility] ENDOSCOPY;  Service: Cardiovascular;  Laterality: N/A;   TRANSTHORACIC ECHOCARDIOGRAM  07-01-2014    done at [facility]   grade 1 diastolic dysfunction,  ef 45%/  trace  TR and PR   WOUND DEBRIDEMENT Left 05/14/2020   Procedure: DEBRIDEMENT WOUND;  Surgeon: Trula Slade, DPM;  Location: [place];  Service: Podiatry;  Laterality: Left;   WOUND DEBRIDEMENT Bilateral 06/04/2020   Procedure: DEBRIDEMENT WOUND BOTH FEET;  Surgeon: Trula Slade, DPM;  Location: [facility];  Service: Podiatry;  Laterality: Bilateral;   Family History  Problem Relation Age of Onset   Diabetes Other    Social History:  reports that he has never smoked. He has never used smokeless tobacco. He reports that he does not drink alcohol and does not use drugs.  ROS: As per HPI otherwise negative.  Physical Exam: Vitals:   09/24/20 0454 09/24/20 0803 09/24/20 0840 09/24/20 1100  BP:  132/78 134/82 137/85  Pulse:  100 (!) 101 98  Resp:   18   Temp:  (!) 97.4 F (36.3 C)  98.1 F (36.7 C)  TempSrc:  Oral  Oral  SpO2:  98% 96% 99%  Weight: 102 kg     Height:         General: Overweight man, NAD but intermittently grimacing d/t pain. Nasal O2. Head: Normocephalic, atraumatic, sclera non-icteric, mucus membranes are moist. Neck: Supple without lymphadenopathy/masses. JVD not elevated. Lungs: Clear bilaterally to auscultation without wheezes, rales, or rhonchi.  Heart: RRR with normal S1, S2. No murmurs, rubs, or gallops appreciated. Abdomen: Tense, pitting edema from mid-chest down including entire abdomen/flanks/legs. Musculoskeletal:  Strength and tone appear normal for age. Lower extremities: 4+ tense pitting edema of B entire legs and thorax Neuro: Alert and oriented X 3. Moves all extremities spontaneously. Psych:  Responds to questions appropriately with a normal affect. Dialysis Access: R forearm AVF + bruit  No Known Allergies Prior to Admission medications   Medication Sig Start Date End Date Taking? Authorizing Provider  aspirin 325 MG tablet Take 325 mg by mouth daily.   Yes [provider]  atorvastatin (LIPITOR) 10 MG tablet Take 10 mg by mouth every  evening.   Yes [provider]  calcium acetate (PHOSLO) 667 MG capsule Take 1,334 mg by mouth 3 (three) times daily with meals. 06/02/10  Yes [provider]  cinacalcet (SENSIPAR) 30 MG tablet Take 30 mg by mouth daily.   Yes [provider]  collagenase (SANTYL) ointment Apply 1 application topically daily. 06/23/20  Yes McDonald, Stephan Minister, DPM  Continuous Blood Gluc Sensor (FREESTYLE LIBRE 14 DAY SENSOR) MISC See admin instructions. 04/08/20  Yes [provider]  cycloSPORINE, PF, (CEQUA) 0.09 % SOLN Place 1 drop into both eyes daily.   Yes [provider]  gabapentin (NEURONTIN) 300 MG capsule Take 300 mg by mouth 3 (three) times daily. 05/09/15  Yes [provider]  Insulin Disposable Pump (OMNIPOD DASH 5 PACK PODS) MISC Inject into the skin. Use with Humalog PUMP 08/28/19  Yes [provider]  insulin lispro (HUMALOG) 100 UNIT/ML injection PUMP   Yes [provider]  lidocaine (XYLOCAINE) 5 % ointment Apply 1 application topically 2 (two) times daily as  needed (finger pain). 07/14/19  Yes [provider]  midodrine (PROAMATINE) 5 MG tablet Take 5 mg by mouth every Tuesday, Thursday, and Saturday at 6 PM.   Yes [provider]  Multiple Vitamin (MULTIVITAMIN WITH MINERALS) TABS tablet Take 1 tablet by mouth daily in the afternoon.   Yes [provider]  mupirocin ointment (BACTROBAN) 2 % Apply 1 application topically daily as needed (itching). 07/16/20  Yes [provider]  ondansetron (ZOFRAN) 4 MG tablet Take 4 mg by mouth 3 (three) times daily as needed for nausea or vomiting. 06/27/19  Yes [provider]  oxyCODONE-acetaminophen (PERCOCET) 10-325 MG tablet Take 1 tablet by mouth 5 (five) times daily. 09/12/20  Yes [provider]  Polyethyl Glycol-Propyl Glycol (SYSTANE) 0.4-0.3 % SOLN Place 1 drop into both eyes daily as needed (dry eyes).   Yes [provider]   sucroferric oxyhydroxide (VELPHORO) 500 MG chewable tablet Chew 500 mg by mouth daily.   Yes [provider]  vitamin B-12 (CYANOCOBALAMIN) 500 MCG tablet Take 500 mcg by mouth daily.   Yes [provider]  febuxostat (ULORIC) 40 MG tablet Take 40 mg by mouth daily. Patient not taking: Reported on 09/24/2020    [provider]   Current Facility-Administered Medications  Medication Dose Route Frequency Provider Last Rate Last Admin   acetaminophen (TYLENOL) tablet 650 mg  650 mg Oral Q6H PRN Marcelyn Bruins, MD   650 mg at 09/24/20 0120   Or   acetaminophen (TYLENOL) suppository 650 mg  650 mg Rectal Q6H PRN Marcelyn Bruins, MD       oxyCODONE (Oxy IR/ROXICODONE) immediate release tablet 10 mg  10 mg Oral Q6H PRN Marcelyn Bruins, MD   10 mg at 09/24/20 [id number]   And   acetaminophen (TYLENOL) tablet 325 mg  325 mg Oral Q6H PRN Marcelyn Bruins, MD       aspirin tablet 325 mg  325 mg Oral Daily Marcelyn Bruins, MD   325 mg at 09/24/20 0912   atorvastatin (LIPITOR) tablet 10 mg  10 mg Oral QPM Marcelyn Bruins, MD       calcium acetate (PHOSLO) capsule 1,334 mg  1,334 mg Oral TID WC Marcelyn Bruins, MD   1,334 mg at 09/24/20 0912   cinacalcet (SENSIPAR) tablet 30 mg  30 mg Oral Daily Marcelyn Bruins, MD   30 mg at 09/24/20 0047   collagenase (SANTYL) ointment   Topical Daily Kayleen Memos, DO   1 application at [id number]   Gerhardt's butt cream   Topical PRN Debbe Odea, MD       Gerhardt's butt cream   Topical Daily Debbe Odea, MD       heparin injection 5,000 Units  5,000 Units Subcutaneous Q8H Marcelyn Bruins, MD   5,000 Units at 09/24/20 0451   insulin aspart (novoLOG) injection 0-5 Units  0-5 Units Subcutaneous QHS Hall, Carole N, DO       insulin aspart (novoLOG) injection 0-6 Units  0-6 Units Subcutaneous TID WC Hall, Carole N, DO       insulin pump   Subcutaneous TID WC, HS, 0200 Marcelyn Bruins, MD       lidocaine  (LIDODERM) 5 % 1 patch  1 patch Transdermal QHS Marcelyn Bruins, MD   1 patch at 09/24/20 0047   [START ON 09/25/2020] midodrine (PROAMATINE) tablet 5 mg  5 mg Oral Q T,Th,Sat-1800 Marcelyn Bruins, MD  polyethylene glycol (MIRALAX / GLYCOLAX) packet 17 g  17 g Oral Daily PRN Marcelyn Bruins, MD       sodium chloride flush (NS) 0.9 % injection 3 mL  3 mL Intravenous Q12H Marcelyn Bruins, MD   3 mL at 09/24/20 0913   Labs: Basic Metabolic Panel: Recent Labs  Lab 09/23/20 1730 09/24/20 0337  NA 133* 135  K 3.7 4.0  CL 93* 97*  CO2 27 25  GLUCOSE 160* 166*  BUN 16 20  CREATININE 4.17* 4.66*  CALCIUM 8.7* 8.7*   Liver Function Tests: Recent Labs  Lab 09/24/20 0337  AST 8*  ALT 9  ALKPHOS 291*  BILITOT 1.3*  PROT 6.2*  ALBUMIN 1.8*   CBC: Recent Labs  Lab 09/23/20 1730 09/24/20 0337  WBC 10.1 9.2  HGB 10.7* 9.9*  HCT 37.4* 33.5*  MCV 100.0 98.0  PLT 348 339   CBG: Recent Labs  Lab 09/24/20 0153 09/24/20 0606  GLUCAP 161* 148*   Studies/Results: DG Chest 2 View  Result Date: 09/23/2020 CLINICAL DATA:  Shortness of breath EXAM: CHEST - 2 VIEW COMPARISON:  07/21/2020, CT 11/26/2018 FINDINGS: Cardiomegaly with vascular congestion and mild interstitial edema. Small right-sided pleural effusion with airspace disease at the right base. No pneumothorax. IMPRESSION: 1. Cardiomegaly with vascular congestion, mild pulmonary edema, and small right-sided pleural effusion 2. Airspace disease at the right base which may be due to atelectasis or pneumonia Electronically Signed   By: Donavan Foil M.D.   On: 09/23/2020 16:57    Dialysis Orders:  TTS at [facility] 3:45hr, 250dialyzer, 450/A1.5, EDW 103kg, 2K/2.5Ca, AVF, heparin 6000 + 500/hr - Aranesp 159mg IV q Thur - Zemplar 987m IV q HD  Assessment/Plan:  Dyspnea/Vol overload/Anasarca: Tense pitting edema all the way up on legs and across trunk/abdomen. Needs aggressive volume offloading. Tentatively  plan for serial/daily dialysis, but this may be affected by our hospital HD census.  ESRD:  Usual TTS sched, s/p full HD yesterday. Spoke to OP RN, has actually been getting under EDW and has been slowly reduced, but patient reports has been fighting with severe LE edema requiring wraps for ~1 year. HD today.  Hypertension/volume: BP controlled but with significant anasarca, needs significant volume removal - 5L UFG today. No antihypertensive meds - on mido '5mg'$  pre-HD only which can be escalated up if we run into issues with BP dropping on HD.  Anemia: Hgb 9.9 - will continue weekly Aranesp.  Metabolic bone disease: Ca ok, Phos pending. Resume binder binders + VDRA.  Nutrition: Alb very low - adding supplements.  B heel wounds: Pics look bad, defer to primary for wound care and vascular evaluation.  Sacral wound: Very painful to patient, wound care involved.  Hx finger amputations, ischemic, ?Buerger's  KaVeneta PentonPA-C 09/24/2020, 11:53 AM  [employer]

## 2020-09-24 NOTE — Progress Notes (Signed)
Pt has left to HD.

## 2020-09-24 NOTE — Progress Notes (Signed)
PROGRESS NOTE    [name] [name]   [id number]  DOB: 1965-05-06  DOA: 09/23/2020 PCP: [name], MD   Brief Narrative:  [name] [name] a 55 y.o. male with medical history significant of upper extremity arterial thrombosis, GI bleed, CHF, ESRD on HD, depression, diabetes, osteomyelitis, diabetic foot ulcer, hypertension, hyperlipidemia who presents with ongoing shortness of breath and bilateral edema. He has been getting progressively short of breath with increasing lower extremity swelling for at least a week now    Subjective: Continues to feel short of breath at rest.  I have evaluated his on his feet which are extensive and down to the bone.  I have shown the pictures to the patient who is completely unaware of the extent of his wounds and states that he never wanted to look at them in the past.  He is in shock    Assessment & Plan:   Principal Problem:   Acute exacerbation of systolic CHF (congestive heart failure)/fluid overload in a patient with end-stage renal disease - Accompanied with shortness of breath - Have consulted nephrology and he has received dialysis today -2D echo reveals an EF of 25 to 30%, global hypokinesis of the LV which is also moderately dilated-indeterminate diastolic filling.  Moderately elevated pulmonary artery systolic pressure -His EF has dropped significantly since last year when it was 40 to 45% on 04/24/2019 -Labs for cardiac eval in the a.m.  Active Problems: Pressure Injury 09/24/20 Heel Left Stage 4 - Full thickness tissue loss with exposed bone, tendon or muscle. (Active)  09/24/20 1022  Location: Heel  Location Orientation: Left  Staging: Stage 4 - Full thickness tissue loss with exposed bone, tendon or muscle.  Wound Description (Comments):   Present on Admission: Yes     Pressure Injury 09/24/20 Heel Right Stage 4 - Full thickness tissue loss with exposed bone, tendon or muscle. (Active)  09/24/20 1023  Location: Heel   Location Orientation: Right  Staging: Stage 4 - Full thickness tissue loss with exposed bone, tendon or muscle.  Wound Description (Comments):   Present on Admission: Yes     Pressure Injury 09/24/20 Vertebral column Lower;Medial Stage 3 -  Full thickness tissue loss. Subcutaneous fat may be visible but bone, tendon or muscle are NOT exposed. (Active)  09/24/20 1024  Location: Vertebral column  Location Orientation: Lower;Medial  Staging: Stage 3 -  Full thickness tissue loss. Subcutaneous fat may be visible but bone, tendon or muscle are NOT exposed.  Wound Description (Comments):   Present on Admission: Yes     Pressure Injury 09/24/20 Back Medial Stage 3 -  Full thickness tissue loss. Subcutaneous fat may be visible but bone, tendon or muscle are NOT exposed. (Active)  09/24/20 1026  Location: Back  Location Orientation: Medial  Staging: Stage 3 -  Full thickness tissue loss. Subcutaneous fat may be visible but bone, tendon or muscle are NOT exposed.  Wound Description (Comments):   Present on Admission: Yes   Bilateral foot wounds - I have requested podiatry to evaluate these-wound care has also evaluated the patient states that he was following with outpatient wound care once a week and a nurse was coming to his house once a week to cleanse the wounds -Per wound care eval today recommendations are as follows> Cleanse heels with NS and pat dry. Apply Santyl to wound bed, cover with NS moist gauze and ABD/KErlix/tape.  Apply Xeroform gauze to any intact or ruptured blisters. Wrap bilateral lower legs with  kerlix and ace bandage.  Change daily.  Sacral wound - Appreciate wound care recommendations>Apply aquacel Ag (LAWSON # [id number]) to skin breakdown apply Gerhardts butt paste to buttocks on maceration.  Cover open wounds with ABD/tape. Change daily. Will implement low air loss mattress -NO DISPOSABLE BRIEFS OR UNDERPADS UNDER PATIENT.  USE A DERMATHERAPY Underpad with low air loss  mattress.    HLD (hyperlipidemia) -Continue statin    Type II diabetes mellitus with renal manifestations Hemoglobin A1C    Component Value Date/Time   HGBA1C 7.7 (H) 09/24/2020 [id number]  -  -Continue sliding scale and insulin pump  Hypotension Continue midodrine with dialysis   Time spent in minutes: 40 DVT prophylaxis: heparin injection 5,000 Units Start: 09/23/20 2300  Code Status: Partial code Family Communication:  Level of Care: Level of care: Telemetry Cardiac Disposition Plan:  Status is: Observation  The patient will require care spanning > 2 midnights and should be moved to inpatient because: Hemodynamically unstable  Dispo: The patient is from: Home              Anticipated d/c is to:  TBD-awaiting PT eval              Patient currently is not medically stable to d/c.   Difficult to place patient No      Consultants:  Nephrology Podiatry Procedures:   Antimicrobials:  Anti-infectives (From admission, onward)    None        Objective: Vitals:   09/24/20 1630 09/24/20 1700 09/24/20 1730 09/24/20 1800  BP: (!) 143/84 133/80 (!) 143/91 (!) 113/97  Pulse: (!) 27 98 97 (!) 55  Resp: '16 16 13   '$ Temp:      TempSrc:      SpO2: (!) 76%     Weight:      Height:        Intake/Output Summary (Last 24 hours) at 09/24/2020 1814 Last data filed at 09/24/2020 0855 Gross per 24 hour  Intake 480 ml  Output --  Net 480 ml   Filed Weights   09/23/20 1926 09/24/20 0454 09/24/20 1502  Weight: 107 kg 102 kg 101.3 kg    Examination: General exam: Appears comfortable  HEENT: PERRLA, oral mucosa moist, no sclera icterus or thrush Respiratory system: Bilateral crackles at bases respiratory effort normal. Cardiovascular system: S1 & S2 heard, RRR.   Gastrointestinal system: Abdomen soft, non-tender, nondistended. Normal bowel sounds. Central nervous system: Alert and oriented. No focal neurological deficits. Extremities: No cyanosis, clubbing -significant  edema in bilateral lower extremities Skin: See pictures below Psychiatry:  Mood & affect appropriate.              Data Reviewed: I have personally reviewed following labs and imaging studies  CBC: Recent Labs  Lab 09/23/20 1730 09/24/20 0337  WBC 10.1 9.2  HGB 10.7* 9.9*  HCT 37.4* 33.5*  MCV 100.0 98.0  PLT 348 [id number]   Basic Metabolic Panel: Recent Labs  Lab 09/23/20 1730 09/24/20 0337  NA 133* 135  K 3.7 4.0  CL 93* 97*  CO2 27 25  GLUCOSE 160* 166*  BUN 16 20  CREATININE 4.17* 4.66*  CALCIUM 8.7* 8.7*  MG  --  2.2   GFR: Estimated Creatinine Clearance: 19.8 mL/min (A) (by C-G formula based on SCr of 4.66 mg/dL (H)). Liver Function Tests: Recent Labs  Lab 09/24/20 0337  AST 8*  ALT 9  ALKPHOS 291*  BILITOT 1.3*  PROT 6.2*  ALBUMIN 1.8*  No results for input(s): LIPASE, AMYLASE in the last 168 hours. No results for input(s): AMMONIA in the last 168 hours. Coagulation Profile: No results for input(s): INR, PROTIME in the last 168 hours. Cardiac Enzymes: No results for input(s): CKTOTAL, CKMB, CKMBINDEX, TROPONINI in the last 168 hours. BNP (last 3 results) No results for input(s): PROBNP in the last 8760 hours. HbA1C: Recent Labs    09/24/20 0337  HGBA1C 7.7*   CBG: Recent Labs  Lab 09/24/20 0153 09/24/20 0606  GLUCAP 161* 148*   Lipid Profile: No results for input(s): CHOL, HDL, LDLCALC, TRIG, CHOLHDL, LDLDIRECT in the last 72 hours. Thyroid Function Tests: No results for input(s): TSH, T4TOTAL, FREET4, T3FREE, THYROIDAB in the last 72 hours. Anemia Panel: No results for input(s): VITAMINB12, FOLATE, FERRITIN, TIBC, IRON, RETICCTPCT in the last 72 hours. Urine analysis: No results found for: COLORURINE, APPEARANCEUR, LABSPEC, PHURINE, GLUCOSEU, HGBUR, BILIRUBINUR, KETONESUR, PROTEINUR, UROBILINOGEN, NITRITE, LEUKOCYTESUR Sepsis Labs: '@LABRCNTIP'$ (procalcitonin:4,lacticidven:4) ) Recent Results (from the past 240 hour(s))  SARS  CORONAVIRUS 2 (TAT 6-24 HRS) Nasopharyngeal Nasopharyngeal Swab     Status: None   Collection Time: 09/23/20 11:53 PM   Specimen: Nasopharyngeal Swab  Result Value Ref Range Status   SARS Coronavirus 2 NEGATIVE NEGATIVE Final    Comment: (NOTE) SARS-CoV-2 target nucleic acids are NOT DETECTED.  The SARS-CoV-2 RNA is generally detectable in upper and lower respiratory specimens during the acute phase of infection. Negative results do not preclude SARS-CoV-2 infection, do not rule out co-infections with other pathogens, and should not be used as the sole basis for treatment or other patient management decisions. Negative results must be combined with clinical observations, patient history, and epidemiological information. The expected result is Negative.  Fact Sheet for Patients: SugarRoll.be  Fact Sheet for Healthcare Providers: https://www.woods-mathews.com/  This test is not yet approved or cleared by the [place] FDA and  has been authorized for detection and/or diagnosis of SARS-CoV-2 by FDA under an Emergency Use Authorization (EUA). This EUA will remain  in effect (meaning this test can be used) for the duration of the COVID-19 declaration under Se ction 564(b)(1) of the Act, 21 U.S.C. section 360bbb-3(b)(1), unless the authorization is terminated or revoked sooner.  Performed at [facility], [address] [place], [place] [address]          Radiology Studies: DG Chest 2 View  Result Date: 09/23/2020 CLINICAL DATA:  Shortness of breath EXAM: CHEST - 2 VIEW COMPARISON:  07/21/2020, CT 11/26/2018 FINDINGS: Cardiomegaly with vascular congestion and mild interstitial edema. Small right-sided pleural effusion with airspace disease at the right base. No pneumothorax. IMPRESSION: 1. Cardiomegaly with vascular congestion, mild pulmonary edema, and small right-sided pleural effusion 2. Airspace disease at the right base which  may be due to atelectasis or pneumonia Electronically Signed   By: Donavan Foil M.D.   On: 09/23/2020 16:57   ECHOCARDIOGRAM COMPLETE  Result Date: 09/24/2020    ECHOCARDIOGRAM REPORT   Patient Name:   [name] [name] Date of Exam: 09/24/2020 Medical Rec #:  [id number]      Height:       65.0 in Accession #:    [id number]     Weight:       224.9 lb Date of Birth:  01-Dec-1965     BSA:          2.079 m Patient Age:    36 years       BP:  134/82 mmHg Patient Gender: M              HR:           102 bpm. Exam Location:  Inpatient Procedure: 2D Echo, Cardiac Doppler and Color Doppler Indications:    CHF-Acute Systolic [id number]  History:        Patient has prior history of Echocardiogram examinations, most                 recent 04/24/2019. Cardiomyopathy and CHF; Risk                 Factors:Hypertension and Diabetes.  Sonographer:    Vickie Epley RDCS Referring Phys: [id number] [place]  1. Left ventricular ejection fraction, by estimation, is 25 to 30%. The left ventricle has severely decreased function. The left ventricle demonstrates global hypokinesis. The left ventricular internal cavity size was moderately dilated. Indeterminate diastolic filling due to E-A fusion.  2. Right ventricular systolic function is normal. The right ventricular size is not well visualized. There is moderately elevated pulmonary artery systolic pressure.  3. Left atrial size was mildly dilated.  4. Right atrial size was mildly dilated.  5. The mitral valve is abnormal. Mild to moderate mitral valve regurgitation. No evidence of mitral stenosis.  6. Tricuspid valve regurgitation is moderate.  7. The aortic valve is tricuspid. There is mild calcification of the aortic valve. There is mild thickening of the aortic valve. Aortic valve regurgitation is not visualized. Mild aortic valve sclerosis is present, with no evidence of aortic valve stenosis.  8. The inferior vena cava is dilated in size with <50% respiratory  variability, suggesting right atrial pressure of 15 mmHg. Comparison(s): Prior images reviewed side by side. Changes from prior study are noted. Personal review of prior echo would put EF ~35-40%, but it has dropped further since the stidy of 04/24/19. Conclusion(s)/Recommendation(s): Since prior study, EF appears reduced with global hypokinesis. FINDINGS  Left Ventricle: Left ventricular ejection fraction, by estimation, is 25 to 30%. The left ventricle has severely decreased function. The left ventricle demonstrates global hypokinesis. The left ventricular internal cavity size was moderately dilated. There is no left ventricular hypertrophy. Indeterminate diastolic filling due to E-A fusion. Right Ventricle: The right ventricular size is not well visualized. Right vetricular wall thickness was not well visualized. Right ventricular systolic function is normal. There is moderately elevated pulmonary artery systolic pressure. The tricuspid regurgitant velocity is 3.19 m/s, and with an assumed right atrial pressure of 15 mmHg, the estimated right ventricular systolic pressure is 99991111 mmHg. Left Atrium: Left atrial size was mildly dilated. Right Atrium: Right atrial size was mildly dilated. Pericardium: There is no evidence of pericardial effusion. Mitral Valve: The mitral valve is abnormal. There is mild thickening of the mitral valve leaflet(s). There is mild calcification of the mitral valve leaflet(s). Mild to moderate mitral valve regurgitation. No evidence of mitral valve stenosis. Tricuspid Valve: The tricuspid valve is normal in structure. Tricuspid valve regurgitation is moderate. Aortic Valve: The aortic valve is tricuspid. There is mild calcification of the aortic valve. There is mild thickening of the aortic valve. Aortic valve regurgitation is not visualized. Mild aortic valve sclerosis is present, with no evidence of aortic valve stenosis. Pulmonic Valve: The pulmonic valve was grossly normal. Pulmonic  valve regurgitation is mild to moderate. No evidence of pulmonic stenosis. Aorta: The aortic root, ascending aorta, aortic arch and descending aorta are all structurally normal, with no evidence of dilitation  or obstruction. Venous: The inferior vena cava is dilated in size with less than 50% respiratory variability, suggesting right atrial pressure of 15 mmHg. IAS/Shunts: The atrial septum is grossly normal.  LEFT VENTRICLE PLAX 2D LVIDd:         5.50 cm LVIDs:         4.60 cm LV PW:         0.70 cm LV IVS:        0.70 cm LVOT diam:     2.00 cm LV SV:         42 LV SV Index:   20 LVOT Area:     3.14 cm  LV Volumes (MOD) LV vol d, MOD A2C: 207.0 ml LV vol d, MOD A4C: 203.0 ml LV vol s, MOD A2C: 153.0 ml LV vol s, MOD A4C: 131.0 ml LV SV MOD A2C:     54.0 ml LV SV MOD A4C:     203.0 ml LV SV MOD BP:      64.1 ml RIGHT VENTRICLE RV S prime:     10.40 cm/s TAPSE (M-mode): 1.2 cm LEFT ATRIUM             Index       RIGHT ATRIUM           Index LA diam:        4.90 cm 2.36 cm/m  RA Area:     17.50 cm LA Vol (A2C):   53.9 ml 25.92 ml/m RA Volume:   45.80 ml  22.03 ml/m LA Vol (A4C):   47.2 ml 22.70 ml/m LA Biplane Vol: 52.0 ml 25.01 ml/m  AORTIC VALVE LVOT Vmax:   91.60 cm/s LVOT Vmean:  54.800 cm/s LVOT VTI:    0.133 m  AORTA Ao Root diam: 2.80 cm TRICUSPID VALVE TR Peak grad:   40.7 mmHg TR Vmax:        319.00 cm/s  SHUNTS Systemic VTI:  0.13 m Systemic Diam: 2.00 cm Buford Dresser MD Electronically signed by Buford Dresser MD Signature Date/Time: 09/24/2020/5:27:22 PM    Final       Scheduled Meds:  aspirin  325 mg Oral Daily   atorvastatin  10 mg Oral QPM   calcium acetate  1,334 mg Oral TID WC   cinacalcet  30 mg Oral Daily   collagenase   Topical Daily   Gerhardt's butt cream   Topical Daily   heparin  5,000 Units Subcutaneous Q8H   heparin sodium (porcine)       insulin aspart  0-5 Units Subcutaneous QHS   insulin aspart  0-6 Units Subcutaneous TID WC   insulin pump   Subcutaneous  TID WC, HS, 0200   lidocaine  1 patch Transdermal QHS   [START ON 09/25/2020] midodrine  5 mg Oral Q T,Th,Sat-1800   sodium chloride flush  3 mL Intravenous Q12H   Continuous Infusions:  albumin human       LOS: 0 days      Debbe Odea, MD [facility] Pager: [email or web address] 09/24/2020, 6:14 PM

## 2020-09-24 NOTE — Progress Notes (Signed)
  Echocardiogram 2D Echocardiogram has been performed.  Geoffery Lyons [place] 09/24/2020, 1:44 PM

## 2020-09-24 NOTE — Progress Notes (Signed)
PT Cancellation Note  Patient Details Name: [name] [name] MRN: [id number] DOB: 1966-01-16   Cancelled Treatment:    Reason Eval/Treat Not Completed: Patient at procedure or test/unavailable. Pt occupied with blood draw and now going to HD. Will follow up tomorrow.    [place] 09/24/2020, 2:48 PM Arty Lantzy [place] Pager [phone] Office [phone]

## 2020-09-24 NOTE — Consult Note (Signed)
Reason for Consult: Heel ulcers Referring Physician: Dr. Debbe Odea, MD  [name] [name] is an 55 y.o. male.  HPI: 55 y.o. male with medical history significant of upper extremity arterial thrombosis, GI bleed, CHF, ESRD on HD, depression, diabetes, osteomyelitis, diabetic foot ulcer, hypertension, hyperlipidemia who presents with ongoing shortness of breath and bilateral edema. He has been getting progressively short of breath with increasing lower extremity swelling for at least a week.   He has a history of bilateral heel ulcerations.  Previously have performed surgery to debride the wounds as well as graft.  He has been referred to the wound care center which she has been following up with.  He has MRIs ordered.  Podiatry's been consulted for concerns of worsening wounds particular on the right side.  Past Medical History:  Diagnosis Date   Anal infection    posterior anal canal   Anemia, chronic renal failure    Atrophic kidney    BILATERAL   DM type 2 causing ESRD ([place])    Nephrologist-- dr [name]  on hemodialysis since June 2012 at  [name]  TTS   Hemodialysis patient [facility])    at [facility] on Tues/ Thur/Sat/schedule   Hemorrhoids    Hepatitis B antibody positive    History of pleural effusion    bilateral   Hyperparathyroidism, secondary renal ([place])    Hypertension    Ischemic cardiomyopathy    per echo 07-01-2014  ef 45%   LAFB (left anterior fascicular block)    Peripheral neuropathy    Systolic and diastolic CHF, chronic ([place])    CARDIOLOGIST-  DR Daneen Schick ([place])  AND DR [name] ([name])    Past Surgical History:  Procedure Laterality Date   APPENDECTOMY  09-12-2004   laparotomy w/ drainage peritinitis   AV FISTULA PLACEMENT  02-27-2010   right forearm (RADIOCEPHALIC)   AV FISTULA REPAIR  10-30-2010   BONE BIOPSY Left 05/14/2020   Procedure: BONE BIOPSY;  Surgeon: Trula Slade,  DPM;  Location: [place];  Service: Podiatry;  Laterality: Left;   CARDIOVASCULAR STRESS TEST  10-29-2011   dr Daneen Schick   Low risk scan;  mild perfusion defect seen in the basal inferoseptal, basal inferior and mid inferior regions consistent with an infarct/scar and/or overlying attenuation/  mild to moderate global LVSF,  ef 40-45%   DOBUTAMINE STRESS ECHO  07-23-2012   [name]   abnormal ;  at rest estimated lvef 25-30% and global severe LV hypokinesis ;  no cp during stress and achieved 85% maxium predicted heart rate;  negative stress ECG for inducible ischemia;  estimated lvef with stress 35-40%;  augmentation of wall segments consistant with cardiomyopathy and differential fibrosis   FISTULOTOMY N/A 03/26/2015   Procedure: FISTULOTOMY;  Surgeon: [name], MD;  Location[facility];  Service: General;  Laterality: N/A;   GRAFT APPLICATION Left 0000000   Procedure: GRAFT APPLICATION;  Surgeon: Trula Slade, DPM;  Location: [place];  Service: Podiatry;  Laterality: Left;   GRAFT APPLICATION Bilateral 0000000   Procedure: GRAFT APPLICATION;  Surgeon: Trula Slade, DPM;  Location: [facility];  Service: Podiatry;  Laterality: Bilateral;   INCISION AND DRAINAGE ABSCESS N/A 03/26/2015   Procedure: ANAL INCISION AND DRAINAGE;  Surgeon: [name], MD;  [facility];  Service: General;  Laterality: N/A;   RETINAL DETACHMENT SURGERY Left 2011   incomplete repair/ needs eye drops  to keep pressure down   TEE WITHOUT CARDIOVERSION N/A 10/17/2017   Procedure: TRANSESOPHAGEAL ECHOCARDIOGRAM (TEE);  Surgeon: Josue Hector, MD;  Location: [facility];  Service: Cardiovascular;  Laterality: N/A;   TRANSTHORACIC ECHOCARDIOGRAM  07-01-2014    done at [facility]   grade 1 diastolic dysfunction,  ef 45%/  trace TR and PR   WOUND DEBRIDEMENT Left 05/14/2020   Procedure: DEBRIDEMENT WOUND;  Surgeon: Trula Slade, DPM;  Location: [place];   Service: Podiatry;  Laterality: Left;   WOUND DEBRIDEMENT Bilateral 06/04/2020   Procedure: DEBRIDEMENT WOUND BOTH FEET;  Surgeon: Trula Slade, DPM;  Location: [facility];  Service: Podiatry;  Laterality: Bilateral;    Family History  Problem Relation Age of Onset   Diabetes Other     Social History:  reports that he has never smoked. He has never used smokeless tobacco. He reports that he does not drink alcohol and does not use drugs.  Allergies: No Known Allergies  Medications: I have reviewed the patient's current medications.  Results for orders placed or performed during the hospital encounter of 09/23/20 (from the past 48 hour(s))  Basic metabolic panel     Status: Abnormal   Collection Time: 09/23/20  5:30 PM  Result Value Ref Range   Sodium 133 (L) 135 - 145 mmol/L   Potassium 3.7 3.5 - 5.1 mmol/L   Chloride 93 (L) 98 - 111 mmol/L   CO2 27 22 - 32 mmol/L   Glucose, Bld 160 (H) 70 - 99 mg/dL    Comment: Glucose reference range applies only to samples taken after fasting for at least 8 hours.   BUN 16 6 - 20 mg/dL   Creatinine, Ser 4.17 (H) 0.61 - 1.24 mg/dL   Calcium 8.7 (L) 8.9 - 10.3 mg/dL   GFR, Estimated 16 (L) >60 mL/min    Comment: (NOTE) Calculated using the CKD-EPI Creatinine Equation (2021)    Anion gap 13 5 - 15    Comment: Performed at [facility] [address], [place], [place] [address]  CBC     Status: Abnormal   Collection Time: 09/23/20  5:30 PM  Result Value Ref Range   WBC 10.1 4.0 - 10.5 K/uL   RBC 3.74 (L) 4.22 - 5.81 MIL/uL   Hemoglobin 10.7 (L) 13.0 - 17.0 g/dL   HCT 37.4 (L) 39.0 - 52.0 %   MCV 100.0 80.0 - 100.0 fL   MCH 28.6 26.0 - 34.0 pg   MCHC 28.6 (L) 30.0 - 36.0 g/dL   RDW 17.2 (H) 11.5 - 15.5 %   Platelets 348 150 - 400 K/uL   nRBC 0.0 0.0 - 0.2 %    Comment: Performed at [facility] [address] [place], [place] [address]  Troponin I (High Sensitivity)     Status: Abnormal   Collection Time: 09/23/20  5:30  PM  Result Value Ref Range   Troponin I (High Sensitivity) 67 (H) <18 ng/L    Comment: (NOTE) Elevated high sensitivity troponin I (hsTnI) values and significant  changes across serial measurements may suggest ACS but many other  chronic and acute conditions are known to elevate hsTnI results.  Refer to the "Links" section for chest pain algorithms and additional  guidance. Performed at [facility], [place] [address], [place], [place] [address]   Troponin I (High Sensitivity)     Status: Abnormal   Collection Time: 09/23/20  8:15 PM  Result Value  Ref Range   Troponin I (High Sensitivity) 67 (H) <18 ng/L    Comment: (NOTE) Elevated high sensitivity troponin I (hsTnI) values and significant  changes across serial measurements may suggest ACS but many other  chronic and acute conditions are known to elevate hsTnI results.  Refer to the "Links" section for chest pain algorithms and additional  guidance. Performed at [facility], [place] [address] [place], [place] [address]   Brain natriuretic peptide     Status: Abnormal   Collection Time: 09/23/20  8:15 PM  Result Value Ref Range   B Natriuretic Peptide >4,500.0 (H) 0.0 - 100.0 pg/mL    Comment: Performed at [facility] [address], [place], [place] [address]  SARS CORONAVIRUS 2 (TAT 6-24 HRS) Nasopharyngeal Nasopharyngeal Swab     Status: None   Collection Time: 09/23/20 11:53 PM   Specimen: Nasopharyngeal Swab  Result Value Ref Range   SARS Coronavirus 2 NEGATIVE NEGATIVE    Comment: (NOTE) SARS-CoV-2 target nucleic acids are NOT DETECTED.  The SARS-CoV-2 RNA is generally detectable in upper and lower respiratory specimens during the acute phase of infection. Negative results do not preclude SARS-CoV-2 infection, do not rule out co-infections with other pathogens, and should not be used as the sole basis for treatment or other patient management decisions. Negative results must be combined with clinical  observations, patient history, and epidemiological information. The expected result is Negative.  Fact Sheet for Patients: SugarRoll.be  Fact Sheet for Healthcare Providers: https://www.woods-mathews.com/  This test is not yet approved or cleared by the [place] FDA and  has been authorized for detection and/or diagnosis of SARS-CoV-2 by FDA under an Emergency Use Authorization (EUA). This EUA will remain  in effect (meaning this test can be used) for the duration of the COVID-19 declaration under Se ction 564(b)(1) of the Act, 21 U.S.C. section 360bbb-3(b)(1), unless the authorization is terminated or revoked sooner.  Performed at [facility], [place] [address] [place], [place] [address]   Glucose, capillary     Status: Abnormal   Collection Time: 09/24/20  1:53 AM  Result Value Ref Range   Glucose-Capillary 161 (H) 70 - 99 mg/dL    Comment: Glucose reference range applies only to samples taken after fasting for at least 8 hours.  Magnesium     Status: None   Collection Time: 09/24/20  3:37 AM  Result Value Ref Range   Magnesium 2.2 1.7 - 2.4 mg/dL    Comment: Performed at [facility] [address], [place], [place] [address]  Comprehensive metabolic panel     Status: Abnormal   Collection Time: 09/24/20  3:37 AM  Result Value Ref Range   Sodium 135 135 - 145 mmol/L   Potassium 4.0 3.5 - 5.1 mmol/L   Chloride 97 (L) 98 - 111 mmol/L   CO2 25 22 - 32 mmol/L   Glucose, Bld 166 (H) 70 - 99 mg/dL    Comment: Glucose reference range applies only to samples taken after fasting for at least 8 hours.   BUN 20 6 - 20 mg/dL   Creatinine, Ser 4.66 (H) 0.61 - 1.24 mg/dL   Calcium 8.7 (L) 8.9 - 10.3 mg/dL   Total Protein 6.2 (L) 6.5 - 8.1 g/dL   Albumin 1.8 (L) 3.5 - 5.0 g/dL   AST 8 (L) 15 - 41 U/L   [name] 9 0 - 44 U/L   Alkaline Phosphatase 291 (H) 38 -  126 U/L   Total Bilirubin 1.3 (H) 0.3 - 1.2 mg/dL   GFR, Estimated 14  (L) >60 mL/min    Comment: (NOTE) Calculated using the CKD-EPI Creatinine Equation (2021)    Anion gap 13 5 - 15    Comment: Performed at [facility] [address] [place], [place] [address]  CBC     Status: Abnormal   Collection Time: 09/24/20  3:37 AM  Result Value Ref Range   WBC 9.2 4.0 - 10.5 K/uL   RBC 3.42 (L) 4.22 - 5.81 MIL/uL   Hemoglobin 9.9 (L) 13.0 - 17.0 g/dL   HCT 33.5 (L) 39.0 - 52.0 %   MCV 98.0 80.0 - 100.0 fL   MCH 28.9 26.0 - 34.0 pg   MCHC 29.6 (L) 30.0 - 36.0 g/dL   RDW 17.6 (H) 11.5 - 15.5 %   Platelets 339 150 - 400 K/uL   nRBC 0.0 0.0 - 0.2 %    Comment: Performed at [facility] [address], [place], [place] [address]  Hemoglobin A1c     Status: Abnormal   Collection Time: 09/24/20  3:37 AM  Result Value Ref Range   Hgb A1c MFr Bld 7.7 (H) 4.8 - 5.6 %    Comment: (NOTE) Pre diabetes:          5.7%-6.4%  Diabetes:              >6.4%  Glycemic control for   <7.0% adults with diabetes    Mean Plasma Glucose 174.29 mg/dL    Comment: Performed at [facility] [address] [place], [place] [address]  Glucose, capillary     Status: Abnormal   Collection Time: 09/24/20  6:06 AM  Result Value Ref Range   Glucose-Capillary 148 (H) 70 - 99 mg/dL    Comment: Glucose reference range applies only to samples taken after fasting for at least 8 hours.    DG Chest 2 View  Result Date: 09/23/2020 CLINICAL DATA:  Shortness of breath EXAM: CHEST - 2 VIEW COMPARISON:  07/21/2020, CT 11/26/2018 FINDINGS: Cardiomegaly with vascular congestion and mild interstitial edema. Small right-sided pleural effusion with airspace disease at the right base. No pneumothorax. IMPRESSION: 1. Cardiomegaly with vascular congestion, mild pulmonary edema, and small right-sided pleural effusion 2. Airspace disease at the right base which may be due to atelectasis or pneumonia Electronically Signed   By: Donavan Foil M.D.   On: 09/23/2020 16:57    Review of  Systems Blood pressure 137/85, pulse 98, temperature 98.1 F (36.7 C), temperature source Oral, resp. rate 18, height '5\' 5"'$  (1.651 m), weight 102 kg, SpO2 99 %. Physical Exam General: AAO x3, NAD  Dermatological: Significant large ulcerations present bilateral plantar heels with the right side worse than left.  On the left side there is no probing to bone but there is granulation tissue present at the distal portion there is necrotic tissue.  There is no fluctuation crepitation there is no malodor.  On the right side there is significant necrotic tissue present probing to bone.  There is no surrounding erythema.  No fluctuation crepitation.  Vascular: Dorsalis pedis artery and Posterior Tibial artery pedal pulses are decreased.   Neruologic: Sensation decreased to light touch   Assessment/Plan: Chronic bilateral lower extremity ulcerations  Given the ulcerations and would recommend x-rays which were ordered.  Pending x-rays we will likely order MRIs.  I discussed with amputation of the right lower extremity however  he declines this.  Likely at least did not need debridement of the wound once he is stabilized medically.  Continue Santyl dressing changes daily which I reapplied today.  I believe it is okay to hold antibiotics for now.  I would like to do a bone biopsy/culture if surgery this admission if the limb is salvageable. He has previously been evaluated by vascular surgery.   Celesta Gentile, DPM  Trula Slade 09/24/2020, 3:33 PM

## 2020-09-25 MED ORDER — LIDOCAINE HCL (PF) 1 % IJ SOLN: 5.0000 mL | INTRAMUSCULAR | Status: DC | PRN

## 2020-09-25 MED ORDER — HEPARIN SODIUM (PORCINE) 1000 UNIT/ML DIALYSIS
5000.0000 [IU] | INTRAMUSCULAR | Status: DC | PRN
  Filled 2020-09-25: qty 5

## 2020-09-25 MED ORDER — PENTAFLUOROPROP-TETRAFLUOROETH EX AERO: 1.0000 "application " | INHALATION_SPRAY | CUTANEOUS | Status: DC | PRN

## 2020-09-25 MED ORDER — OXYCODONE HCL 5 MG PO TABS
ORAL_TABLET | ORAL | Status: AC
  Filled 2020-09-25: qty 2

## 2020-09-25 MED ORDER — SODIUM CHLORIDE 0.9 % IV SOLN: 100.0000 mL | INTRAVENOUS | Status: DC | PRN

## 2020-09-25 MED ORDER — LIDOCAINE-PRILOCAINE 2.5-2.5 % EX CREA: 1.0000 "application " | TOPICAL_CREAM | CUTANEOUS | Status: DC | PRN

## 2020-09-25 NOTE — Progress Notes (Signed)
X-ray reviewed. MRI heel ordered b/l

## 2020-09-25 NOTE — Progress Notes (Signed)
Clean, dry and intact upon leaving dialysis unit

## 2020-09-25 NOTE — Progress Notes (Signed)
PT Cancellation Note  Patient Details Name: [name] [name] MRN: [id number] DOB: 1965-11-27   Cancelled Treatment:    Reason Eval/Treat Not Completed: Medical issues which prohibited therapy. Per podiatry noted MRI of bilateral heels ordered. PT will hold evaluation of mobility until MRI results are documented.   Zenaida Niece 09/25/2020, 4:17 PM

## 2020-09-25 NOTE — Progress Notes (Signed)
Insulin pump replaced. Pt educated to not dose insulin without finger sticks on the hospitals machines. Pt refusing finger sticks at this time   Pt stating he wants to leave today after dialysis due to suggestions yesterday about amputation of his foot. Pt educated.

## 2020-09-25 NOTE — Progress Notes (Signed)
PROGRESS NOTE    [name] [name]   [id number]  DOB: 06-10-1965  DOA: 09/23/2020 PCP: [name], MD   Brief Narrative:  [name] [name] a 55 y.o. male with medical history significant of upper extremity arterial thrombosis, GI bleed, CHF, ESRD on HD, depression, diabetes, osteomyelitis, diabetic foot ulcer, hypertension, hyperlipidemia who presents with ongoing shortness of breath and bilateral edema. He has been getting progressively short of breath with increasing lower extremity swelling for at least a week now    Subjective: Breathing much better today.   Assessment & Plan:   Principal Problem:   Acute exacerbation of systolic CHF (congestive heart failure)/fluid overload in a patient with end-stage renal disease - Accompanied with shortness of breath - Have consulted nephrology and he has received dialysis today -2D echo reveals an EF of 25 to 30%, global hypokinesis of the LV which is also moderately dilated-indeterminate diastolic filling.  Moderately elevated pulmonary artery systolic pressure -His EF has dropped significantly since last year when it was 40 to 45% on 04/24/2019 - cont to control with dialysis- 5 L removed yesterday and 4 L removed today - weight improved from 224 to 208 lb    Active Problems: Bilateral foot wounds with probable osteomyelitis of right foot - see pictures below- right wound has extensive bone exposure and foul smell- when I showed the pictures to the patient he was shocked and emotional about the severity - I have requested podiatry to evaluate these-wound care has also evaluated the patient states that he was following with outpatient wound care once a week and a nurse was coming to his house once a week to cleanse the wounds -Per wound care eval today recommendations are as follows> Cleanse heels with NS and pat dry. Apply Santyl to wound bed, cover with NS moist gauze and ABD/KErlix/tape.  Apply Xeroform gauze to any intact or  ruptured blisters. Wrap bilateral lower legs with kerlix and ace bandage.  Change daily. - Dr Jacqualyn Posey recommends and MRI and a BKA of the right LE- plans for bone biopsy in OR- patient and mother insisting on going home- I have touched base with Dr Jacqualyn Posey who will see patient and speak with him and mother today  Sacral wound - Appreciate wound care recommendations>Apply aquacel Ag (LAWSON # [id number]) to skin breakdown apply Gerhardts butt paste to buttocks on maceration.  Cover open wounds with ABD/tape. Change daily. Will implement low air loss mattress -NO DISPOSABLE BRIEFS OR UNDERPADS UNDER PATIENT.  USE A DERMATHERAPY Underpad with low air loss mattress.  Pressure Injury 09/24/20 Heel Left Stage 4 - Full thickness tissue loss with exposed bone, tendon or muscle. (Active)  09/24/20 1022  Location: Heel  Location Orientation: Left  Staging: Stage 4 - Full thickness tissue loss with exposed bone, tendon or muscle.  Wound Description (Comments):   Present on Admission: Yes     Pressure Injury 09/24/20 Heel Right Stage 4 - Full thickness tissue loss with exposed bone, tendon or muscle. (Active)  09/24/20 1023  Location: Heel  Location Orientation: Right  Staging: Stage 4 - Full thickness tissue loss with exposed bone, tendon or muscle.  Wound Description (Comments):   Present on Admission: Yes     Pressure Injury 09/24/20 Vertebral column Lower;Medial Stage 3 -  Full thickness tissue loss. Subcutaneous fat may be visible but bone, tendon or muscle are NOT exposed. (Active)  09/24/20 1024  Location: Vertebral column  Location Orientation: Lower;Medial  Staging: Stage 3 -  Full  thickness tissue loss. Subcutaneous fat may be visible but bone, tendon or muscle are NOT exposed.  Wound Description (Comments):   Present on Admission: Yes     Pressure Injury 09/24/20 Back Medial Stage 3 -  Full thickness tissue loss. Subcutaneous fat may be visible but bone, tendon or muscle are NOT exposed.  (Active)  09/24/20 1026  Location: Back  Location Orientation: Medial  Staging: Stage 3 -  Full thickness tissue loss. Subcutaneous fat may be visible but bone, tendon or muscle are NOT exposed.  Wound Description (Comments):   Present on Admission: Yes      HLD (hyperlipidemia) -Continue statin    Type II diabetes mellitus with renal manifestations Hemoglobin A1C    Component Value Date/Time   HGBA1C 7.7 (H) 09/24/2020 [id number]  -  -Continue sliding scale and insulin pump  Hypotension Continue midodrine with dialysis   Time spent in minutes: 40 DVT prophylaxis: heparin injection 5,000 Units Start: 09/23/20 2300  Code Status: Partial code Family Communication:  Level of Care: Level of care: Telemetry Medical Disposition Plan:  Status is: Observation  The patient will require care spanning > 2 midnights and should be moved to inpatient because: Hemodynamically unstable  Dispo: The patient is from: Home              Anticipated d/c is to:  TBD-awaiting PT eval              Patient currently is not medically stable to d/c.   Difficult to place patient No      Consultants:  Nephrology Podiatry Procedures:   Antimicrobials:  Anti-infectives (From admission, onward)    None        Objective: Vitals:   09/25/20 1130 09/25/20 1200 09/25/20 1210 09/25/20 1242  BP: 135/81 (!) 183/89 124/82 136/80  Pulse: 100 100 100 96  Resp: '17 13 19 16  '$ Temp:  97.6 F (36.4 C) 97.6 F (36.4 C) 98.3 F (36.8 C)  TempSrc:  Oral Oral Oral  SpO2:  97% 97%   Weight:   94.4 kg   Height:        Intake/Output Summary (Last 24 hours) at 09/25/2020 1808 Last data filed at 09/25/2020 1754 Gross per 24 hour  Intake 820 ml  Output 9000 ml  Net -8180 ml    Filed Weights   09/25/20 0300 09/25/20 0820 09/25/20 1210  Weight: 98.4 kg 98.4 kg 94.4 kg    Examination: General exam: Appears comfortable  HEENT: PERRLA, oral mucosa moist, no sclera icterus or thrush Respiratory  system: Clear to auscultation. Respiratory effort normal. Cardiovascular system: S1 & S2 heard, regular rate and rhythm Gastrointestinal system: Abdomen soft, non-tender, nondistended. Normal bowel sounds   Central nervous system: Alert and oriented. No focal neurological deficits. Extremities: No cyanosis, clubbing or edema Skin: see pictures Psychiatry:  Mood & affect appropriate.                    Data Reviewed: I have personally reviewed following labs and imaging studies  CBC: Recent Labs  Lab 09/23/20 1730 09/24/20 0337  WBC 10.1 9.2  HGB 10.7* 9.9*  HCT 37.4* 33.5*  MCV 100.0 98.0  PLT 348 [id number]    Basic Metabolic Panel: Recent Labs  Lab 09/23/20 1730 09/24/20 0337  NA 133* 135  K 3.7 4.0  CL 93* 97*  CO2 27 25  GLUCOSE 160* 166*  BUN 16 20  CREATININE 4.17* 4.66*  CALCIUM 8.7* 8.7*  MG  --  2.2    GFR: Estimated Creatinine Clearance: 19.1 mL/min (A) (by C-G formula based on SCr of 4.66 mg/dL (H)). Liver Function Tests: Recent Labs  Lab 09/24/20 0337  AST 8*  ALT 9  ALKPHOS 291*  BILITOT 1.3*  PROT 6.2*  ALBUMIN 1.8*    No results for input(s): LIPASE, AMYLASE in the last 168 hours. No results for input(s): AMMONIA in the last 168 hours. Coagulation Profile: No results for input(s): INR, PROTIME in the last 168 hours. Cardiac Enzymes: No results for input(s): CKTOTAL, CKMB, CKMBINDEX, TROPONINI in the last 168 hours. BNP (last 3 results) No results for input(s): PROBNP in the last 8760 hours. HbA1C: Recent Labs    09/24/20 0337  HGBA1C 7.7*    CBG: Recent Labs  Lab 09/24/20 0153 09/24/20 0606  GLUCAP 161* 148*    Lipid Profile: No results for input(s): CHOL, HDL, LDLCALC, TRIG, CHOLHDL, LDLDIRECT in the last 72 hours. Thyroid Function Tests: No results for input(s): TSH, T4TOTAL, FREET4, T3FREE, THYROIDAB in the last 72 hours. Anemia Panel: No results for input(s): VITAMINB12, FOLATE, FERRITIN, TIBC, IRON,  RETICCTPCT in the last 72 hours. Urine analysis: No results found for: COLORURINE, APPEARANCEUR, LABSPEC, PHURINE, GLUCOSEU, HGBUR, BILIRUBINUR, KETONESUR, PROTEINUR, UROBILINOGEN, NITRITE, LEUKOCYTESUR Sepsis Labs: '@LABRCNTIP'$ (procalcitonin:4,lacticidven:4) ) Recent Results (from the past 240 hour(s))  SARS CORONAVIRUS 2 (TAT 6-24 HRS) Nasopharyngeal Nasopharyngeal Swab     Status: None   Collection Time: 09/23/20 11:53 PM   Specimen: Nasopharyngeal Swab  Result Value Ref Range Status   SARS Coronavirus 2 NEGATIVE NEGATIVE Final    Comment: (NOTE) SARS-CoV-2 target nucleic acids are NOT DETECTED.  The SARS-CoV-2 RNA is generally detectable in upper and lower respiratory specimens during the acute phase of infection. Negative results do not preclude SARS-CoV-2 infection, do not rule out co-infections with other pathogens, and should not be used as the sole basis for treatment or other patient management decisions. Negative results must be combined with clinical observations, patient history, and epidemiological information. The expected result is Negative.  Fact Sheet for Patients: SugarRoll.be  Fact Sheet for Healthcare Providers: https://www.woods-mathews.com/  This test is not yet approved or cleared by the [place] FDA and  has been authorized for detection and/or diagnosis of SARS-CoV-2 by FDA under an Emergency Use Authorization (EUA). This EUA will remain  in effect (meaning this test can be used) for the duration of the COVID-19 declaration under Se ction 564(b)(1) of the Act, 21 U.S.C. section 360bbb-3(b)(1), unless the authorization is terminated or revoked sooner.  Performed at [facility], [place] [address], [place], [place] [address]           Radiology Studies: DG Foot Complete Left  Result Date: 09/24/2020 CLINICAL DATA:  Plantar heel ulcer.  Osteomyelitis. EXAM: LEFT FOOT - COMPLETE 3+ VIEW COMPARISON:   Foot radiograph 08/11/2020 FINDINGS: Soft tissue defect overlies the plantar aspect of the calcaneus. No convincing cortical irregularity or destruction to suggest osteomyelitis by radiograph. No radiopaque foreign body. Hammertoe deformity of the digits. Limited assessment of the metatarsal phalangeal joints due to positioning. Generalized osteopenia. Soft tissue edema over the dorsum of the foot has improved from prior exam. Prominent vascular calcifications. IMPRESSION: 1. Soft tissue defect overlies the plantar aspect of the calcaneus. No radiographic findings of the subjacent calcaneus to suggest osteomyelitis. 2. Osteopenia/osteoporosis. 3. Vascular calcifications. Electronically Signed   By: Keith Rake M.D.   On: 09/24/2020 21:03   DG Foot Complete Right  Result Date: 09/24/2020 CLINICAL DATA:  Osteomyelitis.  Large plantar heel ulcer. EXAM: RIGHT FOOT COMPLETE - 3+ VIEW COMPARISON:  Radiograph 08/11/2020. FINDINGS: Technically limited due to overlying artifact from socks. Plantar soft tissue defect overlies the calcaneus. Decreased bone density of the subjacent calcaneus suspicious for osteomyelitis. Hammertoe deformity of the digits. No acute fracture. Decreased soft tissue edema over the dorsum of the foot. IMPRESSION: 1. Plantar soft tissue defect overlies the calcaneus. Decreased bone density of the subjacent calcaneus suspicious for osteomyelitis. 2. Technically limited evaluation due to overlying artifact from socks. Electronically Signed   By: Keith Rake M.D.   On: 09/24/2020 21:01   ECHOCARDIOGRAM COMPLETE  Result Date: 09/24/2020    ECHOCARDIOGRAM REPORT   Patient Name:   [name] [name] Date of Exam: 09/24/2020 Medical Rec #:  [id number]      Height:       65.0 in Accession #:    [id number]     Weight:       224.9 lb Date of Birth:  1965-06-13     BSA:          2.079 m Patient Age:    42 years       BP:           134/82 mmHg Patient Gender: M              HR:           102 bpm. Exam  Location:  Inpatient Procedure: 2D Echo, Cardiac Doppler and Color Doppler Indications:    CHF-Acute Systolic [id number]  History:        Patient has prior history of Echocardiogram examinations, most                 recent 04/24/2019. Cardiomyopathy and CHF; Risk                 Factors:Hypertension and Diabetes.  Sonographer:    Vickie Epley RDCS Referring Phys: [id number] [place]  1. Left ventricular ejection fraction, by estimation, is 25 to 30%. The left ventricle has severely decreased function. The left ventricle demonstrates global hypokinesis. The left ventricular internal cavity size was moderately dilated. Indeterminate diastolic filling due to E-A fusion.  2. Right ventricular systolic function is normal. The right ventricular size is not well visualized. There is moderately elevated pulmonary artery systolic pressure.  3. Left atrial size was mildly dilated.  4. Right atrial size was mildly dilated.  5. The mitral valve is abnormal. Mild to moderate mitral valve regurgitation. No evidence of mitral stenosis.  6. Tricuspid valve regurgitation is moderate.  7. The aortic valve is tricuspid. There is mild calcification of the aortic valve. There is mild thickening of the aortic valve. Aortic valve regurgitation is not visualized. Mild aortic valve sclerosis is present, with no evidence of aortic valve stenosis.  8. The inferior vena cava is dilated in size with <50% respiratory variability, suggesting right atrial pressure of 15 mmHg. Comparison(s): Prior images reviewed side by side. Changes from prior study are noted. Personal review of prior echo would put EF ~35-40%, but it has dropped further since the stidy of 04/24/19. Conclusion(s)/Recommendation(s): Since prior study, EF appears reduced with global hypokinesis. FINDINGS  Left Ventricle: Left ventricular ejection fraction, by estimation, is 25 to 30%. The left ventricle has severely decreased function. The left ventricle demonstrates  global hypokinesis. The left ventricular internal cavity size was moderately dilated. There is no left ventricular hypertrophy. Indeterminate diastolic filling due to E-A fusion. Right Ventricle:  The right ventricular size is not well visualized. Right vetricular wall thickness was not well visualized. Right ventricular systolic function is normal. There is moderately elevated pulmonary artery systolic pressure. The tricuspid regurgitant velocity is 3.19 m/s, and with an assumed right atrial pressure of 15 mmHg, the estimated right ventricular systolic pressure is 99991111 mmHg. Left Atrium: Left atrial size was mildly dilated. Right Atrium: Right atrial size was mildly dilated. Pericardium: There is no evidence of pericardial effusion. Mitral Valve: The mitral valve is abnormal. There is mild thickening of the mitral valve leaflet(s). There is mild calcification of the mitral valve leaflet(s). Mild to moderate mitral valve regurgitation. No evidence of mitral valve stenosis. Tricuspid Valve: The tricuspid valve is normal in structure. Tricuspid valve regurgitation is moderate. Aortic Valve: The aortic valve is tricuspid. There is mild calcification of the aortic valve. There is mild thickening of the aortic valve. Aortic valve regurgitation is not visualized. Mild aortic valve sclerosis is present, with no evidence of aortic valve stenosis. Pulmonic Valve: The pulmonic valve was grossly normal. Pulmonic valve regurgitation is mild to moderate. No evidence of pulmonic stenosis. Aorta: The aortic root, ascending aorta, aortic arch and descending aorta are all structurally normal, with no evidence of dilitation or obstruction. Venous: The inferior vena cava is dilated in size with less than 50% respiratory variability, suggesting right atrial pressure of 15 mmHg. IAS/Shunts: The atrial septum is grossly normal.  LEFT VENTRICLE PLAX 2D LVIDd:         5.50 cm LVIDs:         4.60 cm LV PW:         0.70 cm LV IVS:        0.70  cm LVOT diam:     2.00 cm LV SV:         42 LV SV Index:   20 LVOT Area:     3.14 cm  LV Volumes (MOD) LV vol d, MOD A2C: 207.0 ml LV vol d, MOD A4C: 203.0 ml LV vol s, MOD A2C: 153.0 ml LV vol s, MOD A4C: 131.0 ml LV SV MOD A2C:     54.0 ml LV SV MOD A4C:     203.0 ml LV SV MOD BP:      64.1 ml RIGHT VENTRICLE RV S prime:     10.40 cm/s TAPSE (M-mode): 1.2 cm LEFT ATRIUM             Index       RIGHT ATRIUM           Index LA diam:        4.90 cm 2.36 cm/m  RA Area:     17.50 cm LA Vol (A2C):   53.9 ml 25.92 ml/m RA Volume:   45.80 ml  22.03 ml/m LA Vol (A4C):   47.2 ml 22.70 ml/m LA Biplane Vol: 52.0 ml 25.01 ml/m  AORTIC VALVE LVOT Vmax:   91.60 cm/s LVOT Vmean:  54.800 cm/s LVOT VTI:    0.133 m  AORTA Ao Root diam: 2.80 cm TRICUSPID VALVE TR Peak grad:   40.7 mmHg TR Vmax:        319.00 cm/s  SHUNTS Systemic VTI:  0.13 m Systemic Diam: 2.00 cm Buford Dresser MD Electronically signed by Buford Dresser MD Signature Date/Time: 09/24/2020/5:27:22 PM    Final       Scheduled Meds:  aspirin  325 mg Oral Daily   atorvastatin  10 mg Oral QPM   calcium acetate  1,334 mg Oral TID WC   cinacalcet  30 mg Oral Daily   collagenase   Topical Daily   Gerhardt's butt cream   Topical Daily   heparin  5,000 Units Subcutaneous Q8H   insulin aspart  0-5 Units Subcutaneous QHS   insulin aspart  0-6 Units Subcutaneous TID WC   insulin pump   Subcutaneous TID WC, HS, 0200   lidocaine  1 patch Transdermal QHS   midodrine  5 mg Oral Q T,Th,Sat-1800   oxyCODONE       sodium chloride flush  3 mL Intravenous Q12H   Continuous Infusions:     LOS: 1 day      Debbe Odea, MD [facility] Pager: [email or web address] 09/25/2020, 6:08 PM

## 2020-09-25 NOTE — Progress Notes (Signed)
[facility] Progress Note    Assessment/ Plan:    Dyspnea/Vol overload/Anasarca: Tense pitting edema all the way up on legs and across trunk/abdomen. Needs aggressive volume offloading. Tentatively plan for serial/daily dialysis, but this may be affected by our hospital HD census.  ESRD:  Usual TTS sched, HD today, if still here can plan for HD tomorrow depending on his clinical status.  Hypertension/volume: BP controlled but with significant anasarca, needs significant volume removal. No antihypertensive meds - on mido '5mg'$  pre-HD only which can be escalated up if we run into issues with BP dropping on HD. Will need new edw  Anemia: will continue weekly Aranesp.  Metabolic bone disease: Ca ok, Phos pending. Resume binder binders + VDRA.  Nutrition: Alb very low - adding supplements.  B heel wounds: Pics look bad, defer to primary for wound care and vascular evaluation. Will need possible amputation however patient refusing and wants to leave the hospital  Sacral wound: Very painful to patient, wound care involved.  Hx finger amputations, ischemic, ?Buerger's   Dialysis Orders:  TTS at [facility] 3:45hr, 250dialyzer, 450/A1.5, EDW 103kg, 2K/2.5Ca, AVF, heparin 6000 + 500/hr - Aranesp 126mg IV q Thur - Zemplar 927m IV q HD  ViGean QuintMD [facility]  Subjective:   No acute events overnight. Refusing foot amputation, wants to go home today. Tolerated hd yesterday, feels better. Net uf 5L, will have another session today.   Objective:   BP 134/86 (BP Location: Left Arm)   Pulse 98   Temp 98.2 F (36.8 C) (Oral)   Resp 18   Ht '5\' 5"'$  (1.651 m)   Wt 98.4 kg   SpO2 94%   BMI 36.11 kg/m   Intake/Output Summary (Last 24 hours) at 09/25/2020 0815 Last data filed at 09/24/2020 2000 Gross per 24 hour  Intake 480 ml  Output 5000 ml  Net -4520 ml   Weight change: -5.749 kg  Physical Exam: Gen:nad CVS:s1s2, rrr Resp:cta bl AbQB:[id number]  pitting wall edema ExNA:[id number]) Neuro: awake, alert, speech clear and coherent HD access: rue avf +b/t  Imaging: DG Chest 2 View  Result Date: 09/23/2020 CLINICAL DATA:  Shortness of breath EXAM: CHEST - 2 VIEW COMPARISON:  07/21/2020, CT 11/26/2018 FINDINGS: Cardiomegaly with vascular congestion and mild interstitial edema. Small right-sided pleural effusion with airspace disease at the right base. No pneumothorax. IMPRESSION: 1. Cardiomegaly with vascular congestion, mild pulmonary edema, and small right-sided pleural effusion 2. Airspace disease at the right base which may be due to atelectasis or pneumonia Electronically Signed   By: KiDonavan Foil.D.   On: 09/23/2020 16:57   DG Foot Complete Left  Result Date: 09/24/2020 CLINICAL DATA:  Plantar heel ulcer.  Osteomyelitis. EXAM: LEFT FOOT - COMPLETE 3+ VIEW COMPARISON:  Foot radiograph 08/11/2020 FINDINGS: Soft tissue defect overlies the plantar aspect of the calcaneus. No convincing cortical irregularity or destruction to suggest osteomyelitis by radiograph. No radiopaque foreign body. Hammertoe deformity of the digits. Limited assessment of the metatarsal phalangeal joints due to positioning. Generalized osteopenia. Soft tissue edema over the dorsum of the foot has improved from prior exam. Prominent vascular calcifications. IMPRESSION: 1. Soft tissue defect overlies the plantar aspect of the calcaneus. No radiographic findings of the subjacent calcaneus to suggest osteomyelitis. 2. Osteopenia/osteoporosis. 3. Vascular calcifications. Electronically Signed   By: MeKeith Rake.D.   On: 09/24/2020 21:03   DG Foot Complete Right  Result Date: 09/24/2020 CLINICAL DATA:  Osteomyelitis.  Large plantar heel ulcer. EXAM: RIGHT FOOT COMPLETE - 3+ VIEW COMPARISON:  Radiograph 08/11/2020. FINDINGS: Technically limited due to overlying artifact from socks. Plantar soft tissue defect overlies the calcaneus. Decreased bone density of the  subjacent calcaneus suspicious for osteomyelitis. Hammertoe deformity of the digits. No acute fracture. Decreased soft tissue edema over the dorsum of the foot. IMPRESSION: 1. Plantar soft tissue defect overlies the calcaneus. Decreased bone density of the subjacent calcaneus suspicious for osteomyelitis. 2. Technically limited evaluation due to overlying artifact from socks. Electronically Signed   By: Keith Rake M.D.   On: 09/24/2020 21:01   ECHOCARDIOGRAM COMPLETE  Result Date: 09/24/2020    ECHOCARDIOGRAM REPORT   Patient Name:   [name] [name] Date of Exam: 09/24/2020 Medical Rec #:  [id number]      Height:       65.0 in Accession #:    [id number]     Weight:       224.9 lb Date of Birth:  29-Jul-1965     BSA:          2.079 m Patient Age:    55 years       BP:           134/82 mmHg Patient Gender: M              HR:           102 bpm. Exam Location:  Inpatient Procedure: 2D Echo, Cardiac Doppler and Color Doppler Indications:    CHF-Acute Systolic [id number]  History:        Patient has prior history of Echocardiogram examinations, most                 recent 04/24/2019. Cardiomyopathy and CHF; Risk                 Factors:Hypertension and Diabetes.  Sonographer:    Vickie Epley RDCS Referring Phys: [id number] [place]  1. Left ventricular ejection fraction, by estimation, is 25 to 30%. The left ventricle has severely decreased function. The left ventricle demonstrates global hypokinesis. The left ventricular internal cavity size was moderately dilated. Indeterminate diastolic filling due to E-A fusion.  2. Right ventricular systolic function is normal. The right ventricular size is not well visualized. There is moderately elevated pulmonary artery systolic pressure.  3. Left atrial size was mildly dilated.  4. Right atrial size was mildly dilated.  5. The mitral valve is abnormal. Mild to moderate mitral valve regurgitation. No evidence of mitral stenosis.  6. Tricuspid valve regurgitation  is moderate.  7. The aortic valve is tricuspid. There is mild calcification of the aortic valve. There is mild thickening of the aortic valve. Aortic valve regurgitation is not visualized. Mild aortic valve sclerosis is present, with no evidence of aortic valve stenosis.  8. The inferior vena cava is dilated in size with <50% respiratory variability, suggesting right atrial pressure of 15 mmHg. Comparison(s): Prior images reviewed side by side. Changes from prior study are noted. Personal review of prior echo would put EF ~35-40%, but it has dropped further since the stidy of 04/24/19. Conclusion(s)/Recommendation(s): Since prior study, EF appears reduced with global hypokinesis. FINDINGS  Left Ventricle: Left ventricular ejection fraction, by estimation, is 25 to 30%. The left ventricle has severely decreased function. The left ventricle demonstrates global hypokinesis. The left ventricular internal cavity size was moderately dilated. There is no left ventricular hypertrophy. Indeterminate diastolic filling due to E-A fusion. Right Ventricle: The right  ventricular size is not well visualized. Right vetricular wall thickness was not well visualized. Right ventricular systolic function is normal. There is moderately elevated pulmonary artery systolic pressure. The tricuspid regurgitant velocity is 3.19 m/s, and with an assumed right atrial pressure of 15 mmHg, the estimated right ventricular systolic pressure is [id number] mmHg. Left Atrium: Left atrial size was mildly dilated. Right Atrium: Right atrial size was mildly dilated. Pericardium: There is no evidence of pericardial effusion. Mitral Valve: The mitral valve is abnormal. There is mild thickening of the mitral valve leaflet(s). There is mild calcification of the mitral valve leaflet(s). Mild to moderate mitral valve regurgitation. No evidence of mitral valve stenosis. Tricuspid Valve: The tricuspid valve is normal in structure. Tricuspid valve regurgitation is  moderate. Aortic Valve: The aortic valve is tricuspid. There is mild calcification of the aortic valve. There is mild thickening of the aortic valve. Aortic valve regurgitation is not visualized. Mild aortic valve sclerosis is present, with no evidence of aortic valve stenosis. Pulmonic Valve: The pulmonic valve was grossly normal. Pulmonic valve regurgitation is mild to moderate. No evidence of pulmonic stenosis. Aorta: The aortic root, ascending aorta, aortic arch and descending aorta are all structurally normal, with no evidence of dilitation or obstruction. Venous: The inferior vena cava is dilated in size with less than 50% respiratory variability, suggesting right atrial pressure of 15 mmHg. IAS/Shunts: The atrial septum is grossly normal.  LEFT VENTRICLE PLAX 2D LVIDd:         5.50 cm LVIDs:         4.60 cm LV PW:         0.70 cm LV IVS:        0.70 cm LVOT diam:     2.00 cm LV SV:         42 LV SV Index:   20 LVOT Area:     3.14 cm  LV Volumes (MOD) LV vol d, MOD A2C: 207.0 ml LV vol d, MOD A4C: 203.0 ml LV vol s, MOD A2C: 153.0 ml LV vol s, MOD A4C: 131.0 ml LV SV MOD A2C:     54.0 ml LV SV MOD A4C:     203.0 ml LV SV MOD BP:      64.1 ml RIGHT VENTRICLE RV S prime:     10.40 cm/s TAPSE (M-mode): 1.2 cm LEFT ATRIUM             Index       RIGHT ATRIUM           Index LA diam:        4.90 cm 2.36 cm/m  RA Area:     17.50 cm LA Vol (A2C):   53.9 ml 25.92 ml/m RA Volume:   45.80 ml  22.03 ml/m LA Vol (A4C):   47.2 ml 22.70 ml/m LA Biplane Vol: 52.0 ml 25.01 ml/m  AORTIC VALVE LVOT Vmax:   91.60 cm/s LVOT Vmean:  54.800 cm/s LVOT VTI:    0.133 m  AORTA Ao Root diam: 2.80 cm TRICUSPID VALVE TR Peak grad:   40.7 mmHg TR Vmax:        319.00 cm/s  SHUNTS Systemic VTI:  0.13 m Systemic Diam: 2.00 cm Buford Dresser MD Electronically signed by Buford Dresser MD Signature Date/Time: 09/24/2020/5:27:22 PM    Final     Labs: BMET Recent Labs  Lab 09/23/20 1730 09/24/20 0337  NA 133* 135  K 3.7  4.0  CL 93* 97*  CO2 27  25  GLUCOSE 160* 166*  BUN 16 20  CREATININE 4.17* 4.66*  CALCIUM 8.7* 8.7*   CBC Recent Labs  Lab 09/23/20 1730 09/24/20 0337  WBC 10.1 9.2  HGB 10.7* 9.9*  HCT 37.4* 33.5*  MCV 100.0 98.0  PLT 348 339    Medications:     aspirin  325 mg Oral Daily   atorvastatin  10 mg Oral QPM   calcium acetate  1,334 mg Oral TID WC   cinacalcet  30 mg Oral Daily   collagenase   Topical Daily   Gerhardt's butt cream   Topical Daily   heparin  5,000 Units Subcutaneous Q8H   insulin aspart  0-5 Units Subcutaneous QHS   insulin aspart  0-6 Units Subcutaneous TID WC   insulin pump   Subcutaneous TID WC, HS, 0200   lidocaine  1 patch Transdermal QHS   midodrine  5 mg Oral Q T,Th,Sat-1800   sodium chloride flush  3 mL Intravenous Q12H      Gean Quint, MD [facility] 09/25/2020, 8:15 AM

## 2020-09-25 NOTE — Discharge Summary (Signed)
Physician Discharge Summary  YASIEL SATTERLEE [id number] DOB: 04-20-1965 DOA: 09/23/2020  PCP: [name], MD  Admit date: 09/23/2020 Discharge date: 09/26/2020  Admitted From: Home Disposition: Home  Recommendations for Outpatient Follow-up:   F/u on discussion regarding amputation of b/l lower extermities  Home Health: None Discharge Condition: Stable CODE STATUS: Partial code Diet recommendation: Renal heart healthy and diabetic diet Consultations: Nephrology Podiatry Procedures/Studies: Dialysis   Discharge Diagnoses:  Principal Problem:   Volume overload Active Problems:   Osteomyelitis of foot, left, acute ([name])   Osteomyelitis of foot, right, acute ([place])   Stage III pressure ulcer of sacral region ([place])   Acute systolic CHF (congestive heart failure) ([place])   Essential hypertension   ESRD on hemodialysis ([place])   HLD (hyperlipidemia)   Type II diabetes mellitus with renal manifestations ([place])   Secondary hyperparathyroidism ([place])   Brief Summary: [name] a 55 y.o. male with medical history significant of upper extremity arterial thrombosis, GI bleed, CHF, ESRD on HD, depression, diabetes, osteomyelitis, diabetic foot ulcer, hypertension, hyperlipidemia who presents with ongoing shortness of breath and bilateral edema. He has been getting progressively short of breath with increasing lower extremity swelling for at least a week now. He was admitted for fluid removal and nephrology was consulted.  Hospital Course:  Principal Problem:   Acute exacerbation of systolic CHF (congestive heart failure) Fluid overload in a patient with end-stage renal disease - Accompanied with shortness of breath - Have consulted nephrology   -2D echo reveals an EF of 25 to 30%, global hypokinesis of the LV which is also moderately dilated-indeterminate diastolic filling.  Moderately elevated pulmonary artery systolic pressure -His EF has dropped significantly since last  year when it was 40 to 45% on 04/24/2019 - cont to control with dialysis - dialysis done on 7/6, 7/7, and today-  5 L, then 4 L, then 3.5 L removed - Nephro recommends to continue decreasing his dry weight as he is still fluid overloaded - his dyspnea has resolved       Active Problems: Bilateral foot ulcers with osteomyelitis  - see pictures below - see pictures below- I opened up his dressing on 7/6 when I first evaluated him - right wound has extensive bone exposure and foul smell- when I showed the pictures to the patient he was shocked and emotional about the severity - he follows with the wound center/ Dr Dellia Nims- he has had a graft in the past and also has been on Vancomycin for Actinomyces (per culture) - I have requested podiatry to evaluate these- MRI orderd- see results below- he needs amputation and Dr Jacqualyn Posey (podiatry) has discussed this with the patient and her mother but they decline- his mother states that the wounds are looking too good for him to have an amputation- - he has been insistent on discharge since yesterday- I did not want to discharge him until Podiatry spoke with him again- after he refused surgical intervention, I asked for ID to see him to recommend antibiotics - MRI performed today- see report below regarding extensive destruction of anatomy and osteomyelitis  - Dr Arlina Robes) has recommended Cefepime x 6 wks, Doxy x 6 wks and Flagyl x 2 wks - also recommending a repeat vascular eval but he is not willing to stay for more work up - wound care recommendations are as follows> Cleanse heels with NS and pat dry. Apply Santyl to wound bed, cover with NS moist gauze and ABD/KErlix/tape.  Apply Xeroform gauze to  any intact or ruptured blisters. Wrap bilateral lower legs with kerlix and ace bandage.  Change daily.     Sacral wound - Appreciate wound care recommendations>Apply aquacel Ag (LAWSON # [id number]) to skin breakdown apply Gerhardts butt paste to buttocks on  maceration.  Cover open wounds with ABD/tape. Change daily. Will implement low air loss mattress -NO DISPOSABLE BRIEFS OR UNDERPADS UNDER PATIENT.  USE A DERMATHERAPY Underpad with low air loss mattress.   Pressure Injury 09/24/20 Heel Left Stage 4 - Full thickness tissue loss with exposed bone, tendon or muscle. (Active)  09/24/20 1022  Location: Heel  Location Orientation: Left  Staging: Stage 4 - Full thickness tissue loss with exposed bone, tendon or muscle.  Wound Description (Comments):  Present on Admission: Yes     Pressure Injury 09/24/20 Heel Right Stage 4 - Full thickness tissue loss with exposed bone, tendon or muscle. (Active)  09/24/20 1023  Location: Heel  Location Orientation: Right  Staging: Stage 4 - Full thickness tissue loss with exposed bone, tendon or muscle.  Wound Description (Comments):  Present on Admission: Yes     Pressure Injury 09/24/20 Vertebral column Lower;Medial Stage 3 -  Full thickness tissue loss. Subcutaneous fat may be visible but bone, tendon or muscle are NOT exposed. (Active)  09/24/20 1024  Location: Vertebral column  Location Orientation: Lower;Medial  Staging: Stage 3 -  Full thickness tissue loss. Subcutaneous fat may be visible but bone, tendon or muscle are NOT exposed.  Wound Description (Comments):  Present on Admission: Yes     Pressure Injury 09/24/20 Back Medial Stage 3 -  Full thickness tissue loss. Subcutaneous fat may be visible but bone, tendon or muscle are NOT exposed. (Active)  09/24/20 1026  Location: Back  Location Orientation: Medial  Staging: Stage 3 -  Full thickness tissue loss. Subcutaneous fat may be visible but bone, tendon or muscle are NOT exposed.  Wound Description (Comments):  Present on Admission: Yes         HLD (hyperlipidemia) -Continue statin     Type II diabetes mellitus with renal manifestations Hemoglobin A1C Labs (Brief)          Component Value Date/Time    HGBA1C 7.7 (H) 09/24/2020 0337     - -Continue  insulin pump   Hypotension Continue midodrine with dialysis     Discharge Exam: Vitals:   09/26/20 1330 09/26/20 1400  BP: (!) 142/84 (!) 141/80  Pulse: 94 95  Resp:  18  Temp:    SpO2:     Vitals:   09/26/20 1300 09/26/20 1304 09/26/20 1330 09/26/20 1400  BP: (!) 154/83 137/80 (!) 142/84 (!) 141/80  Pulse: 92 92 94 95  Resp: 17   18  Temp: 98 F (36.7 C)     TempSrc: Oral     SpO2: 100%     Weight:      Height:        General: Pt is alert, awake, not in acute distress Cardiovascular: RRR, S1/S2 +, no rubs, no gallops Respiratory: CTA bilaterally, no wheezing, no rhonchi Abdominal: Soft, NT, ND, bowel sounds + Extremities: no edema, no cyanosis          Discharge Instructions  Discharge Instructions     Diet general   Complete by: As directed    Renal carb modified diet.   No wound care   Complete by: As directed       Allergies as of 09/26/2020   No Known Allergies  Medication List     TAKE these medications    aspirin 325 MG tablet Take 325 mg by mouth daily.   atorvastatin 10 MG tablet Commonly known as: LIPITOR Take 10 mg by mouth every evening.   calcium acetate 667 MG capsule Commonly known as: PHOSLO Take 1,334 mg by mouth 3 (three) times daily with meals.   Cequa 0.09 % Soln Generic drug: cycloSPORINE (PF) Place 1 drop into both eyes daily.   cinacalcet 30 MG tablet Commonly known as: SENSIPAR Take 30 mg by mouth daily.   doxycycline 100 MG tablet Commonly known as: ADOXA Take 1 tablet (100 mg total) by mouth 2 (two) times daily.   FreeStyle [employer] 14 Day Sensor Misc See admin instructions.   gabapentin 300 MG capsule Commonly known as: NEURONTIN Take 300 mg by mouth 3 (three) times daily.   insulin lispro 100 UNIT/ML injection Commonly known as: HUMALOG PUMP   lidocaine 5 % ointment Commonly known as: XYLOCAINE Apply 1 application topically 2 (two) times daily as needed (finger pain).    metroNIDAZOLE 500 MG tablet Commonly known as: Flagyl Take 1 tablet (500 mg total) by mouth 3 (three) times daily for 14 days.   midodrine 5 MG tablet Commonly known as: PROAMATINE Take 5 mg by mouth every Tuesday, Thursday, and Saturday at 6 PM.   multivitamin with minerals Tabs tablet Take 1 tablet by mouth daily in the afternoon.   mupirocin ointment 2 % Commonly known as: BACTROBAN Apply 1 application topically daily as needed (itching).   Omnipod DASH Pods (Gen 4) Misc Inject into the skin. Use with Humalog PUMP   ondansetron 4 MG tablet Commonly known as: ZOFRAN Take 4 mg by mouth 3 (three) times daily as needed for nausea or vomiting.   oxyCODONE-acetaminophen 10-325 MG tablet Commonly known as: PERCOCET Take 1 tablet by mouth 5 (five) times daily.   Santyl ointment Generic drug: collagenase Apply 1 application topically daily.   Systane 0.4-0.3 % Soln Generic drug: Polyethyl Glycol-Propyl Glycol Place 1 drop into both eyes daily as needed (dry eyes).   Velphoro 500 MG chewable tablet Generic drug: sucroferric oxyhydroxide Chew 500 mg by mouth daily.   vitamin B-12 500 MCG tablet Commonly known as: CYANOCOBALAMIN Take 500 mcg by mouth daily.       ASK your doctor about these medications    febuxostat 40 MG tablet Commonly known as: ULORIC Take 40 mg by mouth daily.        No Known Allergies    DG Chest 2 View  Result Date: 09/23/2020 CLINICAL DATA:  Shortness of breath EXAM: CHEST - 2 VIEW COMPARISON:  07/21/2020, CT 11/26/2018 FINDINGS: Cardiomegaly with vascular congestion and mild interstitial edema. Small right-sided pleural effusion with airspace disease at the right base. No pneumothorax. IMPRESSION: 1. Cardiomegaly with vascular congestion, mild pulmonary edema, and small right-sided pleural effusion 2. Airspace disease at the right base which may be due to atelectasis or pneumonia Electronically Signed   By: Donavan Foil M.D.   On:  09/23/2020 16:57   MR HEEL RIGHT WO CONTRAST  Result Date: 09/26/2020 CLINICAL DATA:  Osteomyelitis, foot EXAM: MR OF THE RIGHT HEEL WITHOUT CONTRAST TECHNIQUE: Multiplanar, multisequence MR imaging of the right heel was performed. No intravenous contrast was administered. COMPARISON:  Foot radiographs 09/24/2020 FINDINGS: Bones/Joint/Cartilage There is increased STIR signal and confluent low T1 signal along the plantar aspect of the calcaneus medially and laterally. There is disruption of the cortex. There is no significant  joint effusion. There is no evidence of septic arthritis. Ligaments Chronic ATFL sprain. Intact anterior and posterior tibiofibular ligaments. Calcaneofibular ligament is intact. The deltoid ligament is intact. The spring ligament is intact. Muscles and Tendons Mild muscle atrophy. The proximal lateral plantar fascia attachment is not well visualized and likely torn. The medial bundle appears intact but with increased signal at the attachment on the calcaneus. Soft tissues There is a plantar soft tissue wound with extensive soft tissue swelling and small foci of susceptibility artifact. IMPRESSION: Osteomyelitis of the plantar aspect of the calcaneus with overlying plantar soft tissue wound. Inflammatory change in the subcutaneous tissues with possible foreign body or soft tissue emphysema. No drainable fluid collection. Torn lateral bundle plantar fascia from the proximal attachment. Thickening of the medial bundle plantar fascia with mildly increased signal at the calcaneal attachment, which could be reactive, degenerative change, or represent involvement by the infectious process. Electronically Signed   By: Maurine Simmering   On: 09/26/2020 11:44   MR HEEL LEFT WO CONTRAST  Result Date: 09/26/2020 CLINICAL DATA:  Chronic left heel ulceration.  Diabetes. EXAM: MR OF THE LEFT HEEL WITHOUT CONTRAST TECHNIQUE: Multiplanar, multisequence MR imaging of the left heel was performed. No intravenous  contrast was administered. COMPARISON:  X-ray 09/24/2020 FINDINGS: Large deep soft tissue ulceration at the posterior and plantar aspect of the left heel with ulcer base closely approximating the cortex of the posterior calcaneus. Extensive bone marrow edema throughout the posterior and plantar aspects of the calcaneal body (series 7, image 12). Extensive confluent low T1 marrow signal throughout the plantar aspect of the calcaneus is compatible with acute osteomyelitis (series 6, images 5-16). Marrow signal abnormality abuts the calcaneal sulcus. There is a trace posterior subtalar joint effusion, nonspecific. Preserved marrow signal of the talus, cuboid, and remaining visualized midfoot. Distal tibia and fibula are within normal limits. Trace tibiotalar joint effusion, nonspecific. Distal Achilles tendon is intact inserting onto the posterior calcaneus. Remaining tendinous structures about the ankle are grossly intact. No tenosynovitis. Diffuse intramuscular edema of the foot musculature likely reflecting denervation and myositis. Plantar soft tissue swelling. No organized fluid collection. IMPRESSION: 1. Large deep soft tissue ulceration at the posterior and plantar aspect of the left heel with ulcer base closely approximating the cortex of the posterior calcaneus. Extensive acute osteomyelitis of the calcaneus. 2. Trace posterior subtalar joint effusion is nonspecific. Septic arthritis not excluded. 3. Diffuse intramuscular edema of the foot musculature likely reflecting denervation and myositis. Electronically Signed   By: Davina Poke D.O.   On: 09/26/2020 11:33   DG Foot Complete Left  Result Date: 09/24/2020 CLINICAL DATA:  Plantar heel ulcer.  Osteomyelitis. EXAM: LEFT FOOT - COMPLETE 3+ VIEW COMPARISON:  Foot radiograph 08/11/2020 FINDINGS: Soft tissue defect overlies the plantar aspect of the calcaneus. No convincing cortical irregularity or destruction to suggest osteomyelitis by radiograph. No  radiopaque foreign body. Hammertoe deformity of the digits. Limited assessment of the metatarsal phalangeal joints due to positioning. Generalized osteopenia. Soft tissue edema over the dorsum of the foot has improved from prior exam. Prominent vascular calcifications. IMPRESSION: 1. Soft tissue defect overlies the plantar aspect of the calcaneus. No radiographic findings of the subjacent calcaneus to suggest osteomyelitis. 2. Osteopenia/osteoporosis. 3. Vascular calcifications. Electronically Signed   By: Keith Rake M.D.   On: 09/24/2020 21:03   DG Foot Complete Right  Result Date: 09/24/2020 CLINICAL DATA:  Osteomyelitis.  Large plantar heel ulcer. EXAM: RIGHT FOOT COMPLETE - 3+ VIEW COMPARISON:  Radiograph 08/11/2020. FINDINGS: Technically limited due to overlying artifact from socks. Plantar soft tissue defect overlies the calcaneus. Decreased bone density of the subjacent calcaneus suspicious for osteomyelitis. Hammertoe deformity of the digits. No acute fracture. Decreased soft tissue edema over the dorsum of the foot. IMPRESSION: 1. Plantar soft tissue defect overlies the calcaneus. Decreased bone density of the subjacent calcaneus suspicious for osteomyelitis. 2. Technically limited evaluation due to overlying artifact from socks. Electronically Signed   By: Keith Rake M.D.   On: 09/24/2020 21:01   ECHOCARDIOGRAM COMPLETE  Result Date: 09/24/2020    ECHOCARDIOGRAM REPORT   Patient Name:   RIYAD BRINSER Date of Exam: 09/24/2020 Medical Rec #:  [id number]      Height:       65.0 in Accession #:    [id number]     Weight:       224.9 lb Date of Birth:  1965/07/11     BSA:          2.079 m Patient Age:    80 years       BP:           134/82 mmHg Patient Gender: M              HR:           102 bpm. Exam Location:  Inpatient Procedure: 2D Echo, Cardiac Doppler and Color Doppler Indications:    CHF-Acute Systolic [id number]  History:        Patient has prior history of Echocardiogram examinations, most                  recent 04/24/2019. Cardiomyopathy and CHF; Risk                 Factors:Hypertension and Diabetes.  Sonographer:    Vickie Epley RDCS Referring Phys: [id number] [place]  1. Left ventricular ejection fraction, by estimation, is 25 to 30%. The left ventricle has severely decreased function. The left ventricle demonstrates global hypokinesis. The left ventricular internal cavity size was moderately dilated. Indeterminate diastolic filling due to E-A fusion.  2. Right ventricular systolic function is normal. The right ventricular size is not well visualized. There is moderately elevated pulmonary artery systolic pressure.  3. Left atrial size was mildly dilated.  4. Right atrial size was mildly dilated.  5. The mitral valve is abnormal. Mild to moderate mitral valve regurgitation. No evidence of mitral stenosis.  6. Tricuspid valve regurgitation is moderate.  7. The aortic valve is tricuspid. There is mild calcification of the aortic valve. There is mild thickening of the aortic valve. Aortic valve regurgitation is not visualized. Mild aortic valve sclerosis is present, with no evidence of aortic valve stenosis.  8. The inferior vena cava is dilated in size with <50% respiratory variability, suggesting right atrial pressure of 15 mmHg. Comparison(s): Prior images reviewed side by side. Changes from prior study are noted. Personal review of prior echo would put EF ~35-40%, but it has dropped further since the stidy of 04/24/19. Conclusion(s)/Recommendation(s): Since prior study, EF appears reduced with global hypokinesis. FINDINGS  Left Ventricle: Left ventricular ejection fraction, by estimation, is 25 to 30%. The left ventricle has severely decreased function. The left ventricle demonstrates global hypokinesis. The left ventricular internal cavity size was moderately dilated. There is no left ventricular hypertrophy. Indeterminate diastolic filling due to E-A fusion. Right Ventricle: The  right ventricular size is not well visualized. Right vetricular wall thickness was not well  visualized. Right ventricular systolic function is normal. There is moderately elevated pulmonary artery systolic pressure. The tricuspid regurgitant velocity is 3.19 m/s, and with an assumed right atrial pressure of 15 mmHg, the estimated right ventricular systolic pressure is 99991111 mmHg. Left Atrium: Left atrial size was mildly dilated. Right Atrium: Right atrial size was mildly dilated. Pericardium: There is no evidence of pericardial effusion. Mitral Valve: The mitral valve is abnormal. There is mild thickening of the mitral valve leaflet(s). There is mild calcification of the mitral valve leaflet(s). Mild to moderate mitral valve regurgitation. No evidence of mitral valve stenosis. Tricuspid Valve: The tricuspid valve is normal in structure. Tricuspid valve regurgitation is moderate. Aortic Valve: The aortic valve is tricuspid. There is mild calcification of the aortic valve. There is mild thickening of the aortic valve. Aortic valve regurgitation is not visualized. Mild aortic valve sclerosis is present, with no evidence of aortic valve stenosis. Pulmonic Valve: The pulmonic valve was grossly normal. Pulmonic valve regurgitation is mild to moderate. No evidence of pulmonic stenosis. Aorta: The aortic root, ascending aorta, aortic arch and descending aorta are all structurally normal, with no evidence of dilitation or obstruction. Venous: The inferior vena cava is dilated in size with less than 50% respiratory variability, suggesting right atrial pressure of 15 mmHg. IAS/Shunts: The atrial septum is grossly normal.  LEFT VENTRICLE PLAX 2D LVIDd:         5.50 cm LVIDs:         4.60 cm LV PW:         0.70 cm LV IVS:        0.70 cm LVOT diam:     2.00 cm LV SV:         42 LV SV Index:   20 LVOT Area:     3.14 cm  LV Volumes (MOD) LV vol d, MOD A2C: 207.0 ml LV vol d, MOD A4C: 203.0 ml LV vol s, MOD A2C: 153.0 ml LV vol s,  MOD A4C: 131.0 ml LV SV MOD A2C:     54.0 ml LV SV MOD A4C:     203.0 ml LV SV MOD BP:      64.1 ml RIGHT VENTRICLE RV S prime:     10.40 cm/s TAPSE (M-mode): 1.2 cm LEFT ATRIUM             Index       RIGHT ATRIUM           Index LA diam:        4.90 cm 2.36 cm/m  RA Area:     17.50 cm LA Vol (A2C):   53.9 ml 25.92 ml/m RA Volume:   45.80 ml  22.03 ml/m LA Vol (A4C):   47.2 ml 22.70 ml/m LA Biplane Vol: 52.0 ml 25.01 ml/m  AORTIC VALVE LVOT Vmax:   91.60 cm/s LVOT Vmean:  54.800 cm/s LVOT VTI:    0.133 m  AORTA Ao Root diam: 2.80 cm TRICUSPID VALVE TR Peak grad:   40.7 mmHg TR Vmax:        319.00 cm/s  SHUNTS Systemic VTI:  0.13 m Systemic Diam: 2.00 cm Buford Dresser MD Electronically signed by Buford Dresser MD Signature Date/Time: 09/24/2020/5:27:22 PM    Final      The results of significant diagnostics from this hospitalization (including imaging, microbiology, ancillary and laboratory) are listed below for reference.     Microbiology: Recent Results (from the past 240 hour(s))  SARS CORONAVIRUS 2 (TAT 6-24 HRS)  Nasopharyngeal Nasopharyngeal Swab     Status: None   Collection Time: 09/23/20 11:53 PM   Specimen: Nasopharyngeal Swab  Result Value Ref Range Status   SARS Coronavirus 2 NEGATIVE NEGATIVE Final    Comment: (NOTE) SARS-CoV-2 target nucleic acids are NOT DETECTED.  The SARS-CoV-2 RNA is generally detectable in upper and lower respiratory specimens during the acute phase of infection. Negative results do not preclude SARS-CoV-2 infection, do not rule out co-infections with other pathogens, and should not be used as the sole basis for treatment or other patient management decisions. Negative results must be combined with clinical observations, patient history, and epidemiological information. The expected result is Negative.  Fact Sheet for Patients: SugarRoll.be  Fact Sheet for Healthcare  Providers: https://www.woods-mathews.com/  This test is not yet approved or cleared by the [place] FDA and  has been authorized for detection and/or diagnosis of SARS-CoV-2 by FDA under an Emergency Use Authorization (EUA). This EUA will remain  in effect (meaning this test can be used) for the duration of the COVID-19 declaration under Se ction 564(b)(1) of the Act, 21 U.S.C. section 360bbb-3(b)(1), unless the authorization is terminated or revoked sooner.  Performed at [facility], [place] [address], [place], [place] [address]      Labs: BNP (last 3 results) Recent Labs    07/21/20 1234 09/23/20 2015  BNP 4,425.6* [id number]*   Basic Metabolic Panel: Recent Labs  Lab 09/23/20 1730 09/24/20 0337  NA 133* 135  K 3.7 4.0  CL 93* 97*  CO2 27 25  GLUCOSE 160* 166*  BUN 16 20  CREATININE 4.17* 4.66*  CALCIUM 8.7* 8.7*  MG  --  2.2   Liver Function Tests: Recent Labs  Lab 09/24/20 0337  AST 8*  ALT 9  ALKPHOS 291*  BILITOT 1.3*  PROT 6.2*  ALBUMIN 1.8*   No results for input(s): LIPASE, AMYLASE in the last 168 hours. No results for input(s): AMMONIA in the last 168 hours. CBC: Recent Labs  Lab 09/23/20 1730 09/24/20 0337  WBC 10.1 9.2  HGB 10.7* 9.9*  HCT 37.4* 33.5*  MCV 100.0 98.0  PLT 348 339   Cardiac Enzymes: No results for input(s): CKTOTAL, CKMB, CKMBINDEX, TROPONINI in the last 168 hours. BNP: Invalid input(s): POCBNP CBG: Recent Labs  Lab 09/24/20 0153 09/24/20 0606  GLUCAP 161* 148*   D-Dimer No results for input(s): DDIMER in the last 72 hours. Hgb A1c Recent Labs    09/24/20 0337  HGBA1C 7.7*   Lipid Profile No results for input(s): CHOL, HDL, LDLCALC, TRIG, CHOLHDL, LDLDIRECT in the last 72 hours. Thyroid function studies No results for input(s): TSH, T4TOTAL, T3FREE, THYROIDAB in the last 72 hours.  Invalid input(s): FREET3 Anemia work up No results for input(s): VITAMINB12, FOLATE, FERRITIN, TIBC,  IRON, RETICCTPCT in the last 72 hours. Urinalysis No results found for: COLORURINE, APPEARANCEUR, [place], [place], [place], [place], [place], [place], PROTEINUR, UROBILINOGEN, NITRITE, LEUKOCYTESUR Sepsis Labs Invalid input(s): PROCALCITONIN,  WBC,  LACTICIDVEN Microbiology Recent Results (from the past 240 hour(s))  SARS CORONAVIRUS 2 (TAT 6-24 HRS) Nasopharyngeal Nasopharyngeal Swab     Status: None   Collection Time: 09/23/20 11:53 PM   Specimen: Nasopharyngeal Swab  Result Value Ref Range Status   SARS Coronavirus 2 NEGATIVE NEGATIVE Final    Comment: (NOTE) SARS-CoV-2 target nucleic acids are NOT DETECTED.  The SARS-CoV-2 RNA is generally detectable in upper and lower respiratory specimens during the acute phase of infection. Negative results do not preclude SARS-CoV-2  infection, do not rule out co-infections with other pathogens, and should not be used as the sole basis for treatment or other patient management decisions. Negative results must be combined with clinical observations, patient history, and epidemiological information. The expected result is Negative.  Fact Sheet for Patients: SugarRoll.be  Fact Sheet for Healthcare Providers: https://www.woods-mathews.com/  This test is not yet approved or cleared by the [place] FDA and  has been authorized for detection and/or diagnosis of SARS-CoV-2 by FDA under an Emergency Use Authorization (EUA). This EUA will remain  in effect (meaning this test can be used) for the duration of the COVID-19 declaration under Se ction 564(b)(1) of the Act, 21 U.S.C. section 360bbb-3(b)(1), unless the authorization is terminated or revoked sooner.  Performed at [facility], [place] [address] [place], [place] [address]      Time coordinating discharge in minutes: 65  SIGNED:   Debbe Odea, MD  [facility] 09/26/2020, 2:40 PM

## 2020-09-25 NOTE — Progress Notes (Signed)
Subjective: 55 year old male with past medical history significant for upper extremity arterial thrombosis, diabetes osteomyelitis with chronic heel ulcerations admitted for shortness of breath and increasing lower extremity edema.  He has been getting dialysis daily.  He had worsening of the foot ulcers.  He is ready to leave the hospital he reports.  Objective: AAO x3, NAD Overall exam is unchanged.  There is large full-thickness ulcerations plantar bilateral heels with the right side worse than left with necrotic tissue present.  There is no purulence.  There is probing to bone on the right heel.  There is malodor present.  There is no fluctuance or crepitation.  There is no significant erythema or warmth of the foot. No pain with calf compression, swelling, warmth, erythema  Assessment: Osteomyelitis right heel with ulcerations bilateral heels  Plan: I discussion with him as well as his mom who is present in his brother who was present.  I recommended at least a surgical debridement of the wounds and bone biopsy.  Ultimately BKA but he is not open to BKA and even debridement at this point.  I discussed with him pros and cons of surgery versus continued conservative treatment.  After discussion he elects to proceed with no further surgery on the right side including wound debridement, amputation.  Discussed with him that he is at high risk of amputation particularly if he leaves the hospital at this point.  After discussion he does not want to proceed with any surgery and he understands this is not my recommendation. He was on vancomycin at dialysis as well as amoxicillin 500 mg p.o. every 24 hours.   Celesta Gentile, DPM

## 2020-09-26 ENCOUNTER — Inpatient Hospital Stay (HOSPITAL_COMMUNITY): Payer: Medicare Other

## 2020-09-26 DIAGNOSIS — N186 End stage renal disease: Secondary | ICD-10-CM

## 2020-09-26 DIAGNOSIS — M86171 Other acute osteomyelitis, right ankle and foot: Secondary | ICD-10-CM

## 2020-09-26 DIAGNOSIS — N2581 Secondary hyperparathyroidism of renal origin: Secondary | ICD-10-CM

## 2020-09-26 DIAGNOSIS — L89153 Pressure ulcer of sacral region, stage 3: Secondary | ICD-10-CM

## 2020-09-26 DIAGNOSIS — I5023 Acute on chronic systolic (congestive) heart failure: Secondary | ICD-10-CM

## 2020-09-26 DIAGNOSIS — I5021 Acute systolic (congestive) heart failure: Secondary | ICD-10-CM

## 2020-09-26 DIAGNOSIS — Z794 Long term (current) use of insulin: Secondary | ICD-10-CM

## 2020-09-26 DIAGNOSIS — E1122 Type 2 diabetes mellitus with diabetic chronic kidney disease: Secondary | ICD-10-CM

## 2020-09-26 DIAGNOSIS — Z992 Dependence on renal dialysis: Secondary | ICD-10-CM

## 2020-09-26 DIAGNOSIS — I509 Heart failure, unspecified: Secondary | ICD-10-CM

## 2020-09-26 DIAGNOSIS — M86172 Other acute osteomyelitis, left ankle and foot: Secondary | ICD-10-CM

## 2020-09-26 MED ORDER — SODIUM CHLORIDE 0.9 % IV SOLN
2.0000 g | Freq: Once | INTRAVENOUS | Status: AC
  Administered 2020-09-26: 2 g via INTRAVENOUS
  Filled 2020-09-26: qty 2

## 2020-09-26 MED ORDER — DOXYCYCLINE MONOHYDRATE 100 MG PO TABS: 100.0000 mg | ORAL_TABLET | Freq: Two times a day (BID) | ORAL | 0 refills | Status: DC

## 2020-09-26 MED ORDER — DEXTROSE 50 % IV SOLN
INTRAVENOUS | Status: AC
  Administered 2020-09-26: 8 mL
  Filled 2020-09-26: qty 50

## 2020-09-26 MED ORDER — COVID-19 MRNA VAC-TRIS(PFIZER) 30 MCG/0.3ML IM SUSP: 0.3000 mL | Freq: Once | INTRAMUSCULAR | Status: DC

## 2020-09-26 MED ORDER — METRONIDAZOLE 500 MG PO TABS: 500.0000 mg | ORAL_TABLET | Freq: Three times a day (TID) | ORAL | 0 refills | Status: DC

## 2020-09-26 NOTE — Progress Notes (Signed)
[facility]  I had an extensive conversation with the patient and his mother over the phone around 7 PM yesterday. His mother insisted that the wounds are getting better and that she has been told by the outpt wound care physicians that they are improving. I mentioned that the wounds are exposing the bone and may be infected. I also discussed that Dr Jacqualyn Posey feel that he may need an amputation or at the least a bone biopsy. She states that the wounds are looking too good for an amputation.    I found the following note from 09/01/20 by Dr Dellia Nims (outpt wound care visit):  1. I change the primary dressing here to silver alginate. Both wounds are completely necrotic and draining. 2. He has bilateral lymphedema which makes feeling pulses difficult nevertheless I could not feel anything on the left leg. Proximally at the femoral and popliteal I can feel on the right but again nothing in the dorsal foot or ankle. Dr. Marrion Coy in February showed his feet to be warm and well-perfused with palpable pulses on the right foot but not the left. As noted I cannot identify pulses on either side currently. Hand-held Dopplers at that time showed multiphasic flow in the pedal pulses bilaterally. He was felt to have adequate blood flow for healing. I think he may need to repeat vascular review. 3. Although he had a negative work-up for osteomyelitis in February and this may need to be repeated. This would include bone biopsies and MRIs 4. His wounds are really not making any progress in fact they appear to be deteriorating completely necrotic surfaces 5. These are not usual sites for pressure ulcers although he spends a lot of time in his wheelchair perhaps spends more time with his feet on foot rests. Nevertheless the extent of these wounds is concerning  Today, I will be asking for an ID opinion, as he is declining all necessary procedures, to decide on antibiotics which can hopefully be given during dialysis  treatments.

## 2020-09-26 NOTE — Progress Notes (Signed)
[facility] Progress Note    Assessment/ Plan:    Dyspnea/Vol overload/Anasarca: Tense pitting edema all the way up on legs and across trunk/abdomen. Needs aggressive volume offloading and a new EDW  ESRD:  Usual TTS sched, received daily HD thus far. Sequential treatment/dry UF session today for 2.5 hours and then resume TTS schedule. EDW will need to be further challenged as an outpatient  Hypertension/volume: BP controlled but with significant anasarca which is now improving, needs significant volume removal still. No antihypertensive meds - on mido '5mg'$  pre-HD only which can be escalated up if we run into issues with BP dropping on HD. Will need new edw after today but this needs to likely be further challenged as an outpatient  Anemia: will continue weekly Aranesp.  Metabolic bone disease: Ca ok, Phos pending. Resume binder binders + VDRA.  Nutrition: Alb very low - push protein  B heel wounds: Pics look bad, defer to primary for wound care and vascular evaluation. Will need possible amputation however patient refusing and wants to leave the hospital. Abx per primary and ID. Please let us know and his outpatient HD unit in regards to abx w/ HD  Sacral wound: Very painful to patient, wound care involved.  Hx finger amputations, ischemic, ?Buerger's   Dialysis Orders:  [facility] 3:45hr, 250dialyzer, 450/A1.5, EDW 103kg, 2K/2.5Ca, AVF, heparin 6000 + 500/hr - Aranesp 159mg IV q Thur - Zemplar 964m IV q HD  ViGean QuintMD [facility]  Subjective:   No acute events overnight. Still refusing amputation, him and his mom are adamant about going home today after HD. No complaints, feels much better from a swelling aspect.   Objective:   BP 135/76 (BP Location: Left Arm)   Pulse 99   Temp 99.1 F (37.3 C) (Oral)   Resp 20   Ht '5\' 5"'$  (1.651 m)   Wt 94.4 kg   SpO2 100%   BMI 34.63 kg/m   Intake/Output Summary (Last 24 hours) at 09/26/2020  1021 Last data filed at 09/26/2020 0900 Gross per 24 hour  Intake 946 ml  Output 4000 ml  Net -3054 ml   Weight change: -2.9 kg  Physical Exam: Gen:nad CVS:s1s2, rrr Resp:cta bl AbQB:[id number] pitting wall edema (improved) ExNA:[id number]) Neuro: awake, alert, speech clear and coherent HD access: rue avf +b/t  Imaging: DG Foot Complete Left  Result Date: 09/24/2020 CLINICAL DATA:  Plantar heel ulcer.  Osteomyelitis. EXAM: LEFT FOOT - COMPLETE 3+ VIEW COMPARISON:  Foot radiograph 08/11/2020 FINDINGS: Soft tissue defect overlies the plantar aspect of the calcaneus. No convincing cortical irregularity or destruction to suggest osteomyelitis by radiograph. No radiopaque foreign body. Hammertoe deformity of the digits. Limited assessment of the metatarsal phalangeal joints due to positioning. Generalized osteopenia. Soft tissue edema over the dorsum of the foot has improved from prior exam. Prominent vascular calcifications. IMPRESSION: 1. Soft tissue defect overlies the plantar aspect of the calcaneus. No radiographic findings of the subjacent calcaneus to suggest osteomyelitis. 2. Osteopenia/osteoporosis. 3. Vascular calcifications. Electronically Signed   By: MeKeith Rake.D.   On: 09/24/2020 21:03   DG Foot Complete Right  Result Date: 09/24/2020 CLINICAL DATA:  Osteomyelitis.  Large plantar heel ulcer. EXAM: RIGHT FOOT COMPLETE - 3+ VIEW COMPARISON:  Radiograph 08/11/2020. FINDINGS: Technically limited due to overlying artifact from socks. Plantar soft tissue defect overlies the calcaneus. Decreased bone density of the subjacent calcaneus suspicious for osteomyelitis. Hammertoe deformity of the digits. No  acute fracture. Decreased soft tissue edema over the dorsum of the foot. IMPRESSION: 1. Plantar soft tissue defect overlies the calcaneus. Decreased bone density of the subjacent calcaneus suspicious for osteomyelitis. 2. Technically limited evaluation due to overlying artifact  from socks. Electronically Signed   By: Keith Rake M.D.   On: 09/24/2020 21:01   ECHOCARDIOGRAM COMPLETE  Result Date: 09/24/2020    ECHOCARDIOGRAM REPORT   Patient Name:   [name] [name] Date of Exam: 09/24/2020 Medical Rec #:  [id number]      Height:       65.0 in Accession #:    [id number]     Weight:       224.9 lb Date of Birth:  [birth year]     BSA:          2.079 m Patient Age:    55 years       BP:           134/82 mmHg Patient Gender: M              HR:           102 bpm. Exam Location:  Inpatient Procedure: 2D Echo, Cardiac Doppler and Color Doppler Indications:    CHF-Acute Systolic [id number]  History:        Patient has prior history of Echocardiogram examinations, most                 recent 04/24/2019. Cardiomyopathy and CHF; Risk                 Factors:Hypertension and Diabetes.  Sonographer:    Vickie Epley RDCS Referring Phys: [id number] [place]  1. Left ventricular ejection fraction, by estimation, is 25 to 30%. The left ventricle has severely decreased function. The left ventricle demonstrates global hypokinesis. The left ventricular internal cavity size was moderately dilated. Indeterminate diastolic filling due to E-A fusion.  2. Right ventricular systolic function is normal. The right ventricular size is not well visualized. There is moderately elevated pulmonary artery systolic pressure.  3. Left atrial size was mildly dilated.  4. Right atrial size was mildly dilated.  5. The mitral valve is abnormal. Mild to moderate mitral valve regurgitation. No evidence of mitral stenosis.  6. Tricuspid valve regurgitation is moderate.  7. The aortic valve is tricuspid. There is mild calcification of the aortic valve. There is mild thickening of the aortic valve. Aortic valve regurgitation is not visualized. Mild aortic valve sclerosis is present, with no evidence of aortic valve stenosis.  8. The inferior vena cava is dilated in size with <50% respiratory variability, suggesting  right atrial pressure of 15 mmHg. Comparison(s): Prior images reviewed side by side. Changes from prior study are noted. Personal review of prior echo would put EF ~35-40%, but it has dropped further since the stidy of 04/24/19. Conclusion(s)/Recommendation(s): Since prior study, EF appears reduced with global hypokinesis. FINDINGS  Left Ventricle: Left ventricular ejection fraction, by estimation, is 25 to 30%. The left ventricle has severely decreased function. The left ventricle demonstrates global hypokinesis. The left ventricular internal cavity size was moderately dilated. There is no left ventricular hypertrophy. Indeterminate diastolic filling due to E-A fusion. Right Ventricle: The right ventricular size is not well visualized. Right vetricular wall thickness was not well visualized. Right ventricular systolic function is normal. There is moderately elevated pulmonary artery systolic pressure. The tricuspid regurgitant velocity is 3.19 m/s, and with an assumed right atrial pressure of 15 mmHg, the estimated  right ventricular systolic pressure is [id number] mmHg. Left Atrium: Left atrial size was mildly dilated. Right Atrium: Right atrial size was mildly dilated. Pericardium: There is no evidence of pericardial effusion. Mitral Valve: The mitral valve is abnormal. There is mild thickening of the mitral valve leaflet(s). There is mild calcification of the mitral valve leaflet(s). Mild to moderate mitral valve regurgitation. No evidence of mitral valve stenosis. Tricuspid Valve: The tricuspid valve is normal in structure. Tricuspid valve regurgitation is moderate. Aortic Valve: The aortic valve is tricuspid. There is mild calcification of the aortic valve. There is mild thickening of the aortic valve. Aortic valve regurgitation is not visualized. Mild aortic valve sclerosis is present, with no evidence of aortic valve stenosis. Pulmonic Valve: The pulmonic valve was grossly normal. Pulmonic valve regurgitation is mild  to moderate. No evidence of pulmonic stenosis. Aorta: The aortic root, ascending aorta, aortic arch and descending aorta are all structurally normal, with no evidence of dilitation or obstruction. Venous: The inferior vena cava is dilated in size with less than 50% respiratory variability, suggesting right atrial pressure of 15 mmHg. IAS/Shunts: The atrial septum is grossly normal.  LEFT VENTRICLE PLAX 2D LVIDd:         5.50 cm LVIDs:         4.60 cm LV PW:         0.70 cm LV IVS:        0.70 cm LVOT diam:     2.00 cm LV SV:         42 LV SV Index:   20 LVOT Area:     3.14 cm  LV Volumes (MOD) LV vol d, MOD A2C: 207.0 ml LV vol d, MOD A4C: 203.0 ml LV vol s, MOD A2C: 153.0 ml LV vol s, MOD A4C: 131.0 ml LV SV MOD A2C:     54.0 ml LV SV MOD A4C:     203.0 ml LV SV MOD BP:      64.1 ml RIGHT VENTRICLE RV S prime:     10.40 cm/s TAPSE (M-mode): 1.2 cm LEFT ATRIUM             Index       RIGHT ATRIUM           Index LA diam:        4.90 cm 2.36 cm/m  RA Area:     17.50 cm LA Vol (A2C):   53.9 ml 25.92 ml/m RA Volume:   45.80 ml  22.03 ml/m LA Vol (A4C):   47.2 ml 22.70 ml/m LA Biplane Vol: 52.0 ml 25.01 ml/m  AORTIC VALVE LVOT Vmax:   91.60 cm/s LVOT Vmean:  54.800 cm/s LVOT VTI:    0.133 m  AORTA Ao Root diam: 2.80 cm TRICUSPID VALVE TR Peak grad:   40.7 mmHg TR Vmax:        319.00 cm/s  SHUNTS Systemic VTI:  0.13 m Systemic Diam: 2.00 cm Buford Dresser MD Electronically signed by Buford Dresser MD Signature Date/Time: 09/24/2020/5:27:22 PM    Final     Labs: BMET Recent Labs  Lab 09/23/20 1730 09/24/20 0337  NA 133* 135  K 3.7 4.0  CL 93* 97*  CO2 27 25  GLUCOSE 160* 166*  BUN 16 20  CREATININE 4.17* 4.66*  CALCIUM 8.7* 8.7*   CBC Recent Labs  Lab 09/23/20 1730 09/24/20 0337  WBC 10.1 9.2  HGB 10.7* 9.9*  HCT 37.4* 33.5*  MCV 100.0 98.0  PLT 348 339  Medications:     aspirin  325 mg Oral Daily   atorvastatin  10 mg Oral QPM   calcium acetate  1,334 mg Oral TID WC    cinacalcet  30 mg Oral Daily   collagenase   Topical Daily   Gerhardt's butt cream   Topical Daily   heparin  5,000 Units Subcutaneous Q8H   insulin aspart  0-5 Units Subcutaneous QHS   insulin aspart  0-6 Units Subcutaneous TID WC   insulin pump   Subcutaneous TID WC, HS, 0200   lidocaine  1 patch Transdermal QHS   midodrine  5 mg Oral Q T,Th,Sat-1800   sodium chloride flush  3 mL Intravenous Q12H      Gean Quint, MD [facility] 09/26/2020, 10:21 AM

## 2020-09-26 NOTE — Progress Notes (Signed)
Updated by podiatrist after PT visit today, to allow WBAT on B feet in Darco shoes as pt can tolerate, but was quite painful this morning.  Will recommend two person help and a premedication for pain to attempt standing esp as pt has considerable tissue damage to his feet and a hypersensitive pain. Mee Hives, PT MS Acute Rehab Dept. Number: [place] and [place]

## 2020-09-26 NOTE — Progress Notes (Addendum)
0240 blood sugar 55.  Juice given to patient, advised him his blood sugar was low and he needed to drink the juice and eat some peanut butter crackers.  Patient slowly sipping juice, changing channels on TV.  Patient was again advised to drink the juice because his sugar was low, patient responds "you don't have diabetes and you don't know how this works".  Advised patient after about 20 minutes his sugar was only 58, he would need some dextrose via his Iv because he wasn't eating or drinking.  D50 given IV but only 48m given because patient stated he did not want it and to stop because his IV was painful.  Stopped D50, flushed IV per patient request.  Glucose up to 61 at this time.    Physician notified of low blood sugars, patient refused D50 and that patient says his pump is not giving him insulin at all.  Physician states to recheck blood sugar in 30 minutes.

## 2020-09-26 NOTE — Evaluation (Signed)
Physical Therapy Evaluation Patient Details Name: [name] [name] MRN: [id number] DOB: [birth year] Today's Date: 09/26/2020   History of Present Illness  55 yo male admitted 7/5 with SOB and found to have acute exacerbation of CHF.  Has significant skin breakdown and noted R foot MRI has calcaneal changes with osteomyelitis, L foot imaging shows osteopenia and osteomyelitis.  Pt is refusing surgery and MD asking for PT to evaluate mobility.  PMH includes ESRD (HD TTS), DM2, HTN, gout, peripheral neuropathy, CHF, s/p failed renal transplant.  Clinical Impression  Pt was seen for initial evaluation with restricted mobility due to his findings from MRI, although no word on restricted WB was obtained.  However, due to the damage to his R foot on plantar fascia, and L subtalar joint effusion with extensive tissue injury, NWB should be adhered to until further guidance is obtained.  Pt can focus on lateral scoot and AP transfers until further permission is obtained.  Pt's notes are indicating patient may go home today after HD by his choice.      Follow Up Recommendations SNF    Equipment Recommendations  None recommended by PT    Recommendations for Other Services       Precautions / Restrictions Precautions Precautions: Fall Precaution Comments: B prevalon boots in bed, Darco shoes out of bed Required Braces or Orthoses: Other Brace Other Brace: Darco shoes, prevalon boots Restrictions Weight Bearing Restrictions: No Other Position/Activity Restrictions: NWB both feet should be adhered to for now until further information is obtained from MD, no response yet      Mobility  Bed Mobility Overal bed mobility: Needs Assistance Bed Mobility: Supine to Sit;Sit to Supine     Supine to sit: Mod assist Sit to supine: Max assist   General bed mobility comments: max assist as pt cannot move legs well and does not tolerate touch on feet    Transfers                 General transfer  comment: deferred due to pain and findings on imaging, awaiting guidance  Ambulation/Gait             General Gait Details: unable-awaiting guidance  Stairs            Wheelchair Mobility    Modified Rankin (Stroke Patients Only)       Balance Overall balance assessment: Needs assistance Sitting-balance support: Feet supported;Bilateral upper extremity supported (feet suspended on side of bed with Darco shoes) Sitting balance-Leahy Scale: Poor                                       Pertinent Vitals/Pain Pain Assessment: 0-10 Pain Score: 10-Worst pain ever Pain Location: B feet Pain Descriptors / Indicators: Guarding;Sharp Pain Intervention(s): Limited activity within patient's tolerance;Monitored during session;Repositioned    Home Living Family/patient expects to be discharged to:: Private residence Living Arrangements: Parent Available Help at Discharge: Family;Available 24 hours/day Type of Home: House Home Access: Stairs to enter Entrance Stairs-Rails: Left;Right;Can reach both Entrance Stairs-Number of Steps: 8 Home Layout: Two level;Full bath on main level;Able to live on main level with bedroom/bathroom Home Equipment: Kasandra Knudsen - single point;Shower seat;Walker - 2 wheels;Hand held shower head;Adaptive equipment Additional Comments: pt is lethargic and confused, gave conflicting information to recent history to another PT    Prior Function Level of Independence: Needs assistance   Gait / Transfers Assistance  Needed: walks with walker or cane, pt reports he climbs steps alone but mother reports two people help him  ADL's / Homemaking Assistance Needed: mother handles cooking and cleaning, helps with pt's self care        Hand Dominance   Dominant Hand: Right    Extremity/Trunk Assessment   Upper Extremity Assessment Upper Extremity Assessment: Generalized weakness    Lower Extremity Assessment Lower Extremity Assessment:  Generalized weakness    Cervical / Trunk Assessment Cervical / Trunk Assessment: Kyphotic (mild)  Communication   Communication: No difficulties  Cognition Arousal/Alertness: Lethargic Behavior During Therapy: Flat affect Overall Cognitive Status: Impaired/Different from baseline Area of Impairment: Safety/judgement;Awareness;Problem solving                         Safety/Judgement: Decreased awareness of safety;Decreased awareness of deficits Awareness: Intellectual Problem Solving: Slow processing;Requires verbal cues;Requires tactile cues General Comments: PT told pt that further guidance on WB of feet would be obtained, and not going to stand on his feet.  Required repetitive information on this      General Comments General comments (skin integrity, edema, etc.): pt was unable to stand up due to pain and wounds on B feet, so worked instead with PT on sitting balance on side of bed.  Pt is too weak in upper body to assist scoot and instead worked on sitting balance support    Exercises     Assessment/Plan    PT Assessment Patient needs continued PT services  PT Problem List Decreased strength;Decreased range of motion;Decreased activity tolerance;Decreased balance;Decreased mobility;Decreased coordination;Decreased skin integrity;Pain;Decreased cognition;Decreased knowledge of use of DME;Decreased safety awareness;Decreased knowledge of precautions       PT Treatment Interventions DME instruction;Functional mobility training;Therapeutic activities;Therapeutic exercise;Balance training;Neuromuscular re-education;Patient/family education    PT Goals (Current goals can be found in the Care Plan section)  Acute Rehab PT Goals Patient Stated Goal: to walk and get up stairs to home PT Goal Formulation: With patient/family Time For Goal Achievement: 10/10/20 Potential to Achieve Goals: Fair    Frequency Min 3X/week   Barriers to discharge Inaccessible home  environment;Decreased caregiver support has no tolerance or guidance for WB on heels, home with mother in a staired environment    Co-evaluation               AM-PAC PT "6 Clicks" Mobility  Outcome Measure Help needed turning from your back to your side while in a flat bed without using bedrails?: A Lot Help needed moving from lying on your back to sitting on the side of a flat bed without using bedrails?: A Lot Help needed moving to and from a bed to a chair (including a wheelchair)?: Total Help needed standing up from a chair using your arms (e.g., wheelchair or bedside chair)?: Total Help needed to walk in hospital room?: Total Help needed climbing 3-5 steps with a railing? : Total 6 Click Score: 8    End of Session   Activity Tolerance: Patient limited by fatigue;Patient limited by pain;Treatment limited secondary to medical complications (Comment) (wounds on B feet) Patient left: in bed;with call bell/phone within reach;with bed alarm set;with family/visitor present;Other (comment) (prevalon boots in place) Nurse Communication: Mobility status;Other (comment) (including nursing on chat regarding limits of mobility) PT Visit Diagnosis: Muscle weakness (generalized) (M62.81);Pain Pain - Right/Left:  (Bilateral) Pain - part of body: Ankle and joints of foot    Time: 1010-1043 PT Time Calculation (min) (ACUTE ONLY): 33 min  Charges:   PT Evaluation $PT Eval Moderate Complexity: 1 Mod PT Treatments $Therapeutic Activity: 8-22 mins       Ramond Dial 09/26/2020, 2:53 PM Mee Hives, PT MS Acute Rehab Dept. Number: [place] [facility] [place]

## 2020-09-26 NOTE — Progress Notes (Signed)
Patient wants to leave AMA.  Physicians notified on day shift previously, night hospitalist also notified.  Patient and family agreed to stay tonight if his antibiotics are started.  Advised patient and family that physician wants patient to remain in hospital to receive care, advised that there are health risks associated with patient leaving AMA that may include infection getting worse, loss of limb and possibly contribute to patient decline.  Advised some insurances will not pay for hospital stays where patient leaves AMA and patient may not receive his prescriptions. Patient's mother asked why they were not told this before, advised that physicians wanted patient to remain in hospital.  Patient agreed to stay and stated he wanted to go home tomorrow after dialysis.  Patient and family advised that information would be passed on to physicians however that decision would be up to the physician.

## 2020-09-26 NOTE — Progress Notes (Signed)
Subjective: 55 year old male with past medical history significant for upper extremity arterial thrombosis, diabetes osteomyelitis with chronic heel ulcerations admitted for shortness of breath and increasing lower extremity edema.  He has been getting dialysis daily.  He had worsening of the foot ulcers.  He is ready to leave the hospital he reports.  He reports that he is ready to leave the hospital and has discharge orders in.  MRIs been performed.  Objective: AAO x3, NAD Overall exam is unchanged.  There is large full-thickness ulcerations plantar bilateral heels with the right side worse than left with necrotic tissue present.  There is probing to bone on the right heel.  There is malodor present.  There is no fluctuance or crepitation.  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  I discussed the severity of the infection and I have recommended surgical intervention again today.  He does not want proceed with BKA/amputation.  Discussed with him debridement of the wounds.  Again he does not want to proceed with any surgery and states he wants to go home.  I discussed the risks of the infection and spread of infection including sepsis.  After discussion he still does not proceed with surgery.  He will be discharged home although this is not my recommendation he understands this.  He has a follow-up with the wound care center on Monday.  Antibiotics have been arranged.  I would like to see him in the office next week for follow-up but he wants to wait until he follows up with the wound care center on Monday.  He is scheduled for dialysis tomorrow.  His mom is present today and also try to talk to him about the surgery and he told his mom he did not want to have it done.  I discussed with the patient and his mom return precautions and  importance of checking his temperature daily and if there is any worsening signs or symptoms of infection he needs to report immediately back to the emergency department.  He also has my office number if he needs anything.  Celesta Gentile, DPM

## 2020-09-26 NOTE — Progress Notes (Signed)
Spoke to RN at his outpatient HD unit ([facility]) regarding new dry weight and antibiotics orders:  - Cefepime 2g q HD x 6 weeks - thru 11/07/20 - Needs significant dry weight lowering, we have got him from 103 -> 91kg range and still with diffuse anasarca, recommended new EDW ~ 88-89kg to start. - Also, asked RN to have his usual nephrologist review wound images and attempt to have ongoing discussions with patient about the seriousness of his wounds as it appears he is refusing care here. Since he has a long standing relationship with his nephrologist perhaps this will carry more weight, unclear.  Veneta Penton, PA-C [employer] Pager [phone]

## 2020-09-26 NOTE — TOC Progression Note (Signed)
Transition of Care [facility]) - Progression Note    Patient Details  Name: [name] [name] MRN: [id number] Date of Birth: 07-27-1965  [facility]) CM/SW Contact  Zenon Mayo, RN Phone Number: 09/26/2020, 4:30 PM  Clinical Narrative:    Patient is for dc today, mother is in the room she is transporting him home and her other son will come to assist, he just finished HD.  NCM gave mother the [place] phone number for transportation to call to get him set up with transportation to [facility].  She states she has been taking him to his apts.  He has apt with the [place] wound [facility] on Monday.        Expected Discharge Plan and Services           Expected Discharge Date: 09/26/20                                     Social Determinants of Health (SDOH) Interventions    Readmission Risk Interventions No flowsheet data found.

## 2020-09-26 NOTE — Progress Notes (Signed)
CYLE, HAFLEY ([id number]) Visit Report for 09/23/2020 Chief Complaint Document Details Patient Name: Date of Service: [name] [name], [place] 09/23/2020 12:45 PM Medical Record Number: [id number] Patient Account Number: [id number] Date of Birth/Sex: Treating RN: January 21, 1966 (55 y.o. Hessie Diener Primary Care Provider: [name] Other Clinician: Referring Provider: Treating Provider/Extender: [name] in Treatment: 12 Information Obtained from: Patient Chief Complaint Bilateral heel wounds, buttocks, sacral and back wounds Electronic Signature(s) Signed: 09/23/2020 5:21:14 PM By: Kalman Shan DO Entered By: Kalman Shan on 09/23/2020 15:07:44 -------------------------------------------------------------------------------- HPI Details Patient Name: Date of Service: [name] [name], [place] L. 09/23/2020 12:45 PM Medical Record Number: [id number] Patient Account Number: [id number] Date of Birth/Sex: Treating RN: 08-Nov-1965 (55 y.o. Hessie Diener Primary Care Provider: [name] Other Clinician: Referring Provider: Treating Provider/Extender: [name] in Treatment: 12 History of Present Illness HPI Description: 4/13 - Matson Donini is a 55 year old male with past medical history of insulin-dependent type 2 diabetes, end-stage renal disease on hemodialysis, Raynaud's disease with bilateral hand and fingertip ulcers and essential hypertension that presents with bilateral heel ulcers. This has been an ongoing issue for the patient and has been following closely with podiatry. He had debridement and graft application preformed on 06/04/2020 due to worsening of the wounds. Imaging suggested osteo and he was treated with vancomycin and amoxicillin. Today he presents for further evaluation of the wounds. He is currently using Santyl on these areas daily. He uses a cam boot on the left and a surgical shoe on the right  to help with offloading. He denies any acute pain, fever/chills, increased warmth or erythema to the feet or purulent drainage. 5/9; unfortunately patient has not followed up to his regular appointments. Its been almost a month since I last saw him. He has been using Santyl daily to the wound beds. He states that he sits a lot in his chair with with his feet on the ground. He reports walking around a lot in the day doing his activities of daily living. He denies acute signs of infection. 5/23; patient presents for his 2-week follow-up. He has been using Santyl daily to the wounds and has been trying to offload his heels throughout the day. He states he has to sleep in a wheelchair and occasionally is able to elevate his legs. He denies acute signs of infection. 6/1; patient presents for 1 week follow-up. He has been using Santyl to the wounds however only has a little left and refills are not until 6/14. He was discharged as well by home health for unknown reasons. He continues to sleep in a wheelchair and cannot offload his heels. He states he is receiving a hospital bed today. He denies acute signs of infection. 6/6; patient presents for 1 week follow-up. He had just enough Santyl to cover to yesterday but does not have a refill until 6/14. He did not receive the supplies from home health. Despite having a hospital bed he continues to sleep in a wheelchair and cannot offload his heels. He denies acute signs of infection. He also developed a small wound to the area between his posterior thigh and buttocks. He has been placing a Band-Aid on this. 6/13; this is a patient with severe bilateral plantar heel wounds. He has been using Santyl and Hydrofera Blue. Followed for a prolonged period of time by podiatry. He received operative debridement and bone biopsy in February. He does not describe himself is that frequent and ambulator. He  has bilateral heel offloading shoes During the hospitalization in  February he saw Dr. Donnetta Hutching of vein and vascular he noted the atypical location of these wounds and noted that the noninvasive arterial studies revealed noncompressible vessels bilaterally but the toe brachial indexes on the left were normal and slightly diminished on the right. MRI showed no abscesses The patient is a type 2 diabetes on dialysis. 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 He was switched to silver alginate at last clinic visit. Overall his wounds continue to slightly worsen. He cannot relieve the pressure off his heels and states he continues to sleep in his wheelchair due to back pain. 6/27; patient presents for 1 week follow-up. He now has developed for new wounds located to his back, sacrum and buttocks. He is not obtain the MRIs of his bilateral heels. We will give him the number to call back. He reports stability in his wounds. He continues to sleep for most of the time in his wheelchair and sits in the chair for at least 5 hours in the daytime. He currently denies signs of infection. 7/5; patient presents for 1 week follow-up. He states that the wounds to his back and sacrum and buttocks are well-healing. He is scheduled to obtain the MRIs of his heels on 7/18. He has been unable to dress the wounds every day. He has been short of breath recently and is sleeping more in the wheelchair. Electronic Signature(s) Signed: 09/23/2020 5:21:14 PM By: Kalman Shan DO Entered By: Kalman Shan on 09/23/2020 17:19:15 -------------------------------------------------------------------------------- Physical Exam Details Patient Name: Date of Service: [name] Mocha, MA [place] L. 09/23/2020 12:45 PM Medical Record Number: [id number] Patient Account Number: [id number] Date of Birth/Sex: Treating RN: December 08, 1965 (55 y.o. Hessie Diener Primary Care Provider: [name] Other Clinician: Referring  Provider: Treating Provider/Extender: [name] in Treatment: 12 Constitutional respirations regular, non-labored and within target range for patient.[name] Psychiatric pleasant and cooperative. Notes Left and right calcaneus with nonviable surface that is very close to bone. His left buttocks, sacral region, back and left upper thigh have non viable tissue and have increased in size. Electronic Signature(s) Signed: 09/23/2020 5:21:14 PM By: Kalman Shan DO Entered By: Kalman Shan on 09/23/2020 16:34:58 -------------------------------------------------------------------------------- Physician Orders Details Patient Name: Date of Service: [name] Mocha, [place] L. 09/23/2020 12:45 PM Medical Record Number: [id number] Patient Account Number: [id number] Date of Birth/Sex: Treating RN: Apr 24, 1965 (55 y.o. 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 ****Go to the emergency room related to shortness of breath, odor, drainage, and the Return A need further imaging for both feet***** Additional Orders / Instructions Other: - bilateral heels abd pad and kerlix apply to patient's feet. triple antibiotic ointment and foam borders to buttock  wounds. Home Health New wound care orders this week; continue  Home Health for wound care. May utilize formulary equivalent dressing for wound treatment orders unless otherwise specified. [facility] sent patient to emergency department. Wound Treatment Electronic Signature(s) Signed: 09/23/2020 5:21:14 PM By: Kalman Shan DO Signed: 09/23/2020 6:55:28 PM By: Deon Pilling Entered By: Deon Pilling on 09/23/2020 14:17:01 -------------------------------------------------------------------------------- Problem List Details Patient Name: Date of Service: [name] [name], [name] 09/23/2020 12:45 PM Medical Record Number: [id number] Patient Account Number: [id number] Date of Birth/Sex: Treating RN: [birth year] (55 y.o. 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 08/25/2020 No Yes L89.302 Pressure ulcer of unspecified buttock, stage 2 09/15/2020 No Yes L89.152 Pressure ulcer of sacral region, stage 2 09/15/2020 No Yes L89.102 Pressure ulcer of unspecified part of back, stage 2 09/15/2020 No Yes Inactive Problems Resolved Problems ICD-10 Code Description Active Date Resolved Date S00.411A Abrasion of right ear, initial encounter 08/11/2020 08/11/2020 Electronic Signature(s) Signed: 09/23/2020 5:21:14 PM By: Kalman Shan DO Entered By: Kalman Shan on 09/23/2020  16:34:23 -------------------------------------------------------------------------------- Progress Note Details Patient Name: Date of Service: [name] [name], [place] L. 09/23/2020 12:45 PM Medical Record Number: [id number] Patient Account Number: [id number] Date of Birth/Sex: Treating RN: 10-09-1965 (55 y.o. Hessie Diener Primary Care Provider: [name] Other Clinician: Referring Provider: Treating Provider/Extender: [name] in Treatment: 12 Subjective Chief Complaint Information obtained from Patient Bilateral heel wounds, buttocks, sacral and back wounds History of Present Illness (HPI) 4/13 - Cordarrel Regalbuto is a 55 year old male with past medical history of insulin-dependent type 2 diabetes, end-stage renal disease on hemodialysis, Raynaud's disease with bilateral hand and fingertip ulcers and essential hypertension that presents with bilateral heel ulcers. This has been an ongoing issue for the patient and has been following closely with podiatry. He had debridement and graft application preformed on 06/04/2020 due to worsening of the wounds. Imaging suggested osteo and he was treated with vancomycin and amoxicillin. Today he presents for further evaluation of the wounds. He is currently using Santyl on these areas daily. He uses a cam boot on the left and a surgical shoe on the right to help with offloading. He denies any acute pain, fever/chills, increased warmth or erythema to the feet or purulent drainage. 5/9; unfortunately patient has not followed up to his regular appointments. Its been almost a month since I last saw him. He has been using Santyl daily to the wound beds. He states that he sits a lot in his chair with with his feet on the ground. He reports walking around a lot in the day doing his activities of daily living. He denies acute signs of infection. 5/23; patient presents for his 2-week follow-up. He has been using Santyl daily to  the wounds and has been trying to offload his heels throughout the day. He states he has to sleep in a wheelchair and occasionally is able to elevate his legs. He denies acute signs of infection. 6/1; patient presents for 1 week follow-up. He has been using Santyl to the wounds however only has a little left and refills are  not until 6/14. He was discharged as well by home health for unknown reasons. He continues to sleep in a wheelchair and cannot offload his heels. He states he is receiving a hospital bed today. He denies acute signs of infection. 6/6; patient presents for 1 week follow-up. He had just enough Santyl to cover to yesterday but does not have a refill until 6/14. He did not receive the supplies from home health. Despite having a hospital bed he continues to sleep in a wheelchair and cannot offload his heels. He denies acute signs of infection. He also developed a small wound to the area between his posterior thigh and buttocks. He has been placing a Band-Aid on this. 6/13; this is a patient with severe bilateral plantar heel wounds. He has been using Santyl and Hydrofera Blue. Followed for a prolonged period of time by podiatry. He received operative debridement and bone biopsy in February. He does not describe himself is that frequent and ambulator. He has bilateral heel offloading shoes During the hospitalization in February he saw Dr. Donnetta Hutching of vein and vascular he noted the atypical location of these wounds and noted that the noninvasive arterial studies revealed noncompressible vessels bilaterally but the toe brachial indexes on the left were normal and slightly diminished on the right. MRI showed no abscesses The patient is a type 2 diabetes on dialysis. 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 He was switched to silver alginate at last clinic visit. Overall his wounds continue to slightly  worsen. He cannot relieve the pressure off his heels and states he continues to sleep in his wheelchair due to back pain. 6/27; patient presents for 1 week follow-up. He now has developed for new wounds located to his back, sacrum and buttocks. He is not obtain the MRIs of his bilateral heels. We will give him the number to call back. He reports stability in his wounds. He continues to sleep for most of the time in his wheelchair and sits in the chair for at least 5 hours in the daytime. He currently denies signs of infection. 7/5; patient presents for 1 week follow-up. He states that the wounds to his back and sacrum and buttocks are well-healing. He is scheduled to obtain the MRIs of his heels on 7/18. He has been unable to dress the wounds every day. He has been short of breath recently and is sleeping more in the wheelchair. Patient History Information obtained from Patient. Family History Unknown History. Social History Never smoker, Marital Status - Single, Alcohol Use - Rarely, Drug Use - No History, Caffeine Use - Rarely. 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 Vitals Time Taken: 1:24 PM, Height: 65 in, Source: Stated, Weight: 240 lbs, Source: Stated, BMI: 39.9, Temperature: 98.1 F, Pulse: 118 bpm, Respiratory Rate: 20 breaths/min, Blood Pressure: 140/96 mmHg, Capillary Blood Glucose: 136 mg/dl. General  Notes: glucose per pt report this am Psychiatric pleasant and cooperative. General Notes: Left and right calcaneus with nonviable surface that is very close to bone.  His left buttocks, sacral region, back and left upper thigh have non viable tissue and have increased in size. Integumentary (Hair, Skin) Wound #2 status is Open. Original cause of wound was Pressure Injury. The date acquired was: 04/22/2020. The wound has been in treatment 12 weeks. The wound is located on the Right Calcaneus. The wound measures 11cm length x 6.7cm width x 1.2cm depth; 57.884cm^2 area and 69.461cm^3 volume. There is bone and Fat Layer (Subcutaneous Tissue) exposed. There is no tunneling or undermining noted. There is a large amount of purulent drainage noted. Foul odor after cleansing was noted. The wound margin is well defined and not attached to the wound base. There is small (1-33%) pink granulation within the wound bed. There is a large (67-100%) amount of necrotic tissue within the wound bed including Adherent Slough. Wound #3 status is Open. Original cause of wound was Pressure Injury. The date acquired was: 04/22/2020. The wound has been in treatment 12 weeks. The wound is located on the Left Calcaneus. The wound measures 7.9cm length x 6.5cm width x 0.5cm depth; 40.33cm^2 area and 20.165cm^3 volume. There is Fat Layer (Subcutaneous Tissue) exposed. There is no tunneling or undermining noted. There is a large amount of purulent drainage noted. Foul odor after cleansing was noted. The wound margin is well defined and not attached to the wound base. There is no granulation within the wound bed. There is a medium (34-66%) amount of necrotic tissue within the wound bed including Adherent Slough. Wound #5 status is Open. Original cause of wound was Shear/Friction. The date acquired was: 08/22/2020. The wound has been in treatment 4 weeks. The wound is located on the Left Gluteal fold. The wound measures 2cm length x 1.1cm  width x 0.1cm depth; 1.728cm^2 area and 0.173cm^3 volume. There is Fat Layer (Subcutaneous Tissue) exposed. There is no tunneling or undermining noted. There is a small amount of serosanguineous drainage noted. The wound margin is distinct with the outline attached to the wound base. There is large (67-100%) red, pink granulation within the wound bed. There is no necrotic tissue within the wound bed. Wound #6 status is Open. Original cause of wound was Gradually Appeared. The date acquired was: 09/15/2020. The wound has been in treatment 1 weeks. The wound is located on the Sacrum. The wound measures 1.6cm length x 4.9cm width x 0.1cm depth; 6.158cm^2 area and 0.616cm^3 volume. There is Fat Layer (Subcutaneous Tissue) exposed. There is no tunneling or undermining noted. There is a medium amount of serosanguineous drainage noted. The wound margin is indistinct and nonvisible. There is no granulation within the wound bed. There is a large (67-100%) amount of necrotic tissue within the wound bed including Eschar and Adherent Slough. Wound #7 status is Open. Original cause of wound was Gradually Appeared. The date acquired was: 09/15/2020. The wound has been in treatment 1 weeks. The wound is located on the Left Gluteus. The wound measures 1cm length x 1.2cm width x 0.1cm depth; 0.942cm^2 area and 0.094cm^3 volume. There is Fat Layer (Subcutaneous Tissue) exposed. There is no tunneling or undermining noted. There is a medium amount of serous drainage noted. The wound margin is distinct with the outline attached to the wound base. There is no granulation within the wound bed. There is a large (67-100%) amount of necrotic tissue within the wound bed including Adherent Slough. Wound #8 status is Open. Original cause of wound was Pressure Injury. The date acquired was: 09/15/2020. The wound has been in treatment 1 weeks.  The wound is located on the Right,Distal Back. The wound measures 2.1cm length x 1.5cm width  x 0.1cm depth; 2.474cm^2 area and 0.247cm^3 volume. There is Fat Layer (Subcutaneous Tissue) exposed. There is no tunneling or undermining noted. There is a small amount of serosanguineous drainage noted. The wound margin is distinct with the outline attached to the wound base. There is no granulation within the wound bed. There is a large (67-100%) amount of necrotic tissue within the wound bed including Eschar. 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 Unfortunately the heel wounds do not appear to be doing well. He states he has not been putting the wound dressings on every day. He has also been requiring his wheelchair more often due to feeling short of breath. He is scheduled for MRIs on the the 18th of this month. Unfortunately with the decline in appearance and extreme odor I think it would be best that he be evaluated in the ED. He is also feeling short of breath and may be volume up. He does hemodialysis and may require at least an x-ray to assess his pulmonary status. I suspect he will also need admission and IV antibiotics for his bilateral heel ulcers. At minimum an MRI. I will see him next week if he is not admitted. He also has wounds to his back and buttocks region but states he does not want these evaluated today. Per nursing report these have increased in size and have nonviable tissue. This is likely because of increased use in the wheelchair. He can continue antibiotic ointment to these areas. Plan Follow-up  Appointments: Return Appointment in: - Call when you are out of the hospital. ****Go to the emergency room related to shortness of breath, odor, drainage, and the need further imaging for both feet***** Additional Orders / Instructions: Other: - bilateral heels abd pad and kerlix apply to patient's feet. triple antibiotic ointment and foam borders to buttock wounds. Home Health: New wound care orders this week; continue Home Health for wound care. May utilize formulary equivalent dressing for wound treatment orders unless otherwise specified. [facility] sent patient to emergency department. 1. Sent to the ED for evaluation of bilateral heel ulcers that have declined and will require MRI 2. Follow-up next week 3. continue santyl daily if not admitted Electronic Signature(s) Signed: 09/23/2020 5:21:14 PM By: Kalman Shan DO Entered By: Kalman Shan on 09/23/2020 17:20:56 -------------------------------------------------------------------------------- HxROS Details Patient Name: Date of Service: [name] [name], [place] L. 09/23/2020 12:45 PM Medical Record Number: [id number] Patient Account Number: [id number] Date of Birth/Sex: Treating RN: June 24, 1965 (55 y.o. Hessie Diener Primary Care Provider: [name] Other Clinician: Referring Provider: Treating Provider/Extender: [name] in Treatment: 12 Information Obtained From Patient Hematologic/Lymphatic Medical History: Positive for: Anemia Cardiovascular Medical History: Positive for: Congestive Heart Failure; Hypertension Past Medical History Notes: Cardiomyopathy, arterial clots Endocrine Medical History: Positive for: Type II Diabetes Treated with: Insulin Blood sugar tested every day: Yes Tested : 2-3x a day Genitourinary Medical History: Positive for: End Stage Renal Disease Past Medical  History Notes: Hemodiaylsis Immunological Medical History: Positive  for: Raynauds Past Medical History Notes: 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 Neurologic Medical History: Positive for: Neuropathy Immunizations Pneumococcal Vaccine: Received Pneumococcal Vaccination: Yes Implantable Devices None Family and Social History Unknown History: Yes; Never smoker; Marital Status - Single; Alcohol Use: Rarely; Drug Use: No History; Caffeine Use: Rarely; Financial Concerns: No; Food, Clothing or Shelter Needs: No; Support System Lacking: No; Transportation Concerns: No Electronic Signature(s) Signed: 09/23/2020 5:21:14 PM By: Kalman Shan DO Signed: 09/23/2020 6:55:28 PM By: Deon Pilling Entered By: Kalman Shan on 09/23/2020 15:08:58 -------------------------------------------------------------------------------- SuperBill Details Patient Name: Date of Service: [name] [name], [place] L. 09/23/2020 Medical Record Number: [id number] Patient Account Number: [id number] Date of Birth/Sex: Treating RN: 05/26/1965 (55 y.o. Lorette Ang, Meta.Reding Primary Care Provider: [name] Other Clinician: Referring Provider: Treating Provider/Extender: [name] in Treatment: 12 Diagnosis Coding ICD-10 Codes Code Description [phone] Pressure ulcer of right heel, unspecified stage L89.620 Pressure ulcer of left heel, unstageable E11.621 Type 2 diabetes mellitus with foot ulcer N18.6 End stage renal disease L97.101 Non-pressure chronic ulcer of unspecified thigh limited to breakdown of skin L89.302 Pressure ulcer of unspecified buttock, stage 2 L89.152 Pressure ulcer of sacral region, stage 2 L89.102 Pressure ulcer of unspecified part of back, stage 2 Facility Procedures CPT4 Code: [id number] Description: [phone] - WOUND CARE VISIT-LEV 5 EST PT Modifier: Quantity: 1 Physician Procedures : CPT4 Code Description Modifier [id number] - WC PHYS LEVEL 4 - EST PT ICD-10 Diagnosis Description L89.619 Pressure ulcer of right heel,  unspecified stage L89.620 Pressure ulcer of left heel, unstageable E11.621 Type 2 diabetes mellitus with foot  ulcer N18.6 End stage renal disease Quantity: 1 Electronic Signature(s) Signed: 09/23/2020 6:55:28 PM By: Deon Pilling Signed: 09/26/2020 4:56:18 PM By: Kalman Shan DO Previous Signature: 09/23/2020 5:21:14 PM Version By: Kalman Shan DO Entered By: Deon Pilling on 09/23/2020 18:26:50

## 2020-09-26 NOTE — Progress Notes (Signed)
Called to patient room.  Patient on phone with his mother asking why antibiotics are not started yet.  Let her know physicians mentioned an infectious disease doctor seeing patient to make sure he gets the correct antibiotic.  Patient and family member verbalized they are not happy with this.  Stated they only stayed because they wanted IV antibiotics started tonight.  Let patient and family member know physicians would be in tomorrow morning and would be coming to see patient at that time.

## 2020-09-26 NOTE — Progress Notes (Signed)
Discharge education and materials provided to patient and his mother. Verbalized understanding. IV access and telemetry monitor removed, no issues noted. Patient discharged from unit with all belongings.

## 2020-09-26 NOTE — Consult Note (Addendum)
[facility]                                                                                        Patient Identification: Patient Name: [name] [name] MRN: [id number] [place] Date: 09/23/2020  3:55 PM Today's Date: 09/26/2020 Reason for consult: Osteomyelitis Requesting provider: Debbe Odea  Principal Problem:   Acute exacerbation of CHF (congestive heart failure) ([place]) Active Problems:   Essential hypertension   ESRD on hemodialysis ([place])   HLD (hyperlipidemia)   Type II diabetes mellitus with renal manifestations ([place])   Secondary hyperparathyroidism ([place])   Volume overload   Pressure injury of skin   CHF (congestive heart failure) ([place])   Antibiotics: None  Lines/Tubes: Right upper extremity AV fistula, PIV's  Assessment 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 I have seen him previously for his left heel Osteomyelitis. Spoke at length with patient including his mother about this being a dead bone with no good bloody supply for IV antibiotics to work on and futility of antibiotics. They are not willing to undergo any surgical intervention at this point and wants to try IV antibiotics.   Recommendations  OK to do Doxycyline ( will cover Actinomyces previously isolated from left heel as well as MRSA), cefepime post HD for 6 weeks and PO Metronidazole for the first 2 weeks per patient's preference although I do not prefer this. I have explained him/his mother that antibiotics is not going to heal the ulcers.  Would also have vascular weigh in for assessing his blood flow to his lower extremities if he is  agreeable/willing to stay. Seems like he wants to leave today. Last assessment by Vascular was done on Feb 2022. Would update podiatry with MRI findings although less likely he will change his mind for not having surgical intervention  Monitor CBC, BMP weekly on IV antibiotics  I will sign off for now. Please call with questions   Rest of the management as per the primary team. Please call with questions or concerns.  Thank you for the consult  Rosiland Oz, MD Infectious Disease Physician [facility] [address] [place], [place] [address] Phone: [phone]  Fax: [phone]  __________________________________________________________________________________________________________ HPI and Hospital Course: [name] [name]. Budnick is a 55 year old male with past medical history significant for upper extremity arterial thrombosis, GI bleed, CHF, ESRD on HD, DM, depression, bilateral chronic diabetic foot ulcers, hypertension, hyperlipidemia who presented to the ED on 7/5 with shortness of breath and bilateral pedal edema.  Shortness of breath and worsening of lower extremity edema was going on for 1 week, compliant with his hemodialysis sessions.  Denies any fever, chills, chest pain, abdominal pain, nausea, vomiting, constipation or diarrhea. Denies taking any antibiotics prior to admit. He has been following with wound care for his bilateral foot ulcers.   Patient was previously  seen by me for left heel ulcer and Osteomyelitis. He is s/p debridement and bone biopsy on 2/23. Initially on Vanc/cefepime and metronidazole. However, OR cx with growth of actinomyces later. Antibiotics was changed to Vancomycin with HD and amoxicillin on 3/24. Which was stopped around  ?Mid April. 3/16 had debridement of both foot wound and bilateral graft application.S/p Excisional debridement of Left heel ulcer on 4/4 with Podiatry followed by another debridement  with wound care center on 4/12  Vascular SX consulted in Feb 2022 with no concerns of poor blood flow to the Lower extremities. ABI in 05/14/20 with moderate PAD b/l    At ED, he was afebrile, WBC 10.1, BNP more than 4500.  Chest x-ray consistent with vascular congestion and mild pulmonary edema Nephrology has been consulted for HD He has also seen by Podiatry for his bilateral foot ulcers and he seems to have refused operative intervention.   ROS: General- Denies fever, chills, loss of appetite and loss of weight HEENT - Denies headache, blurry vision, neck pain, sinus pain Chest - Denies any chest pain, cough, SOB+ CVS- Denies any dizziness/lightheadedness, syncopal attacks, palpitations Abdomen- Denies any nausea, vomiting, abdominal pain, hematochezia and diarrhea Neuro - Denies any weakness, numbness, tingling sensation Psych - Denies any changes in mood irritability or depressive symptoms GU- Denies any burning, dysuria, hematuria or increased frequency of urination Skin - denies any rashes/lesions MSK - denies any joint pain/swelling or restricted ROM   Past Medical History:  Diagnosis Date   Anal infection    posterior anal canal   Anemia, chronic renal failure    Atrophic kidney    BILATERAL   DM type 2 causing ESRD ([place])    Nephrologist-- dr [name] [facility])--  on hemodialysis since June 2012 at  [name]  TTS   Hemodialysis patient [facility])    at [facility] on Tues/ Thur/Sat/schedule   Hemorrhoids    Hepatitis B antibody positive    History of pleural effusion    bilateral   Hyperparathyroidism, secondary renal ([name])    Hypertension    Ischemic cardiomyopathy    per echo 07-01-2014  ef 45%   LAFB (left anterior fascicular block)    Peripheral neuropathy    Systolic and diastolic CHF, chronic ([place])    CARDIOLOGIST-  DR Daneen Schick ([facility])  AND DR [name] ([name])   Past Surgical History:  Procedure  Laterality Date   APPENDECTOMY  09-12-2004   laparotomy w/ drainage peritinitis   AV FISTULA PLACEMENT  02-27-2010   right forearm (RADIOCEPHALIC)   AV FISTULA REPAIR  10-30-2010   BONE BIOPSY Left 05/14/2020   Procedure: BONE BIOPSY;  Surgeon: Trula Slade, DPM;  Location: [facility];  Service: Podiatry;  Laterality: Left;   CARDIOVASCULAR STRESS TEST  10-29-2011   dr Daneen Schick   Low risk scan;  mild perfusion defect seen in the basal inferoseptal, basal inferior and mid inferior regions consistent with an infarct/scar and/or overlying attenuation/  mild to moderate global LVSF,  ef 40-45%   DOBUTAMINE STRESS ECHO  07-23-2012   [name]   abnormal ;  at rest estimated lvef 25-30% and global severe LV hypokinesis ;  no cp during stress and achieved 85% maxium predicted heart rate;  negative stress ECG for inducible ischemia;  estimated lvef with stress 35-40%;  augmentation of wall segments consistant with cardiomyopathy and differential fibrosis   FISTULOTOMY N/A 03/26/2015   Procedure: FISTULOTOMY;  Surgeon: Elmo Putt  Marcello Moores, MD;  Location: [facility];  Service: General;  Laterality: N/A;   GRAFT APPLICATION Left 0000000   Procedure: GRAFT APPLICATION;  Surgeon: Trula Slade, DPM;  Location: [facility];  Service: Podiatry;  Laterality: Left;   GRAFT APPLICATION Bilateral 0000000   Procedure: GRAFT APPLICATION;  Surgeon: Trula Slade, DPM;  Location: [facility];  Service: Podiatry;  Laterality: Bilateral;   INCISION AND DRAINAGE ABSCESS N/A 03/26/2015   Procedure: ANAL INCISION AND DRAINAGE;  Surgeon: [name], MD;  Location: [facility];  Service: General;  Laterality: N/A;   RETINAL DETACHMENT SURGERY Left 2011   incomplete repair/ needs eye drops to keep pressure down   TEE WITHOUT CARDIOVERSION N/A 10/17/2017   Procedure: TRANSESOPHAGEAL ECHOCARDIOGRAM (TEE);  Surgeon: Josue Hector, MD;  Location: [facility]OS[facility];  Service: Cardiovascular;  Laterality:  N/A;   TRANSTHORACIC ECHOCARDIOGRAM  07-01-2014    done at [facility]   grade 1 diastolic dysfunction,  ef 45%/  trace TR and PR   WOUND DEBRIDEMENT Left 05/14/2020   Procedure: DEBRIDEMENT WOUND;  Surgeon: Trula Slade, DPM;  Location: [place];  Service: Podiatry;  Laterality: Left;   WOUND DEBRIDEMENT Bilateral 06/04/2020   Procedure: DEBRIDEMENT WOUND BOTH FEET;  Surgeon: Trula Slade, DPM;  Location: [facility];  Service: Podiatry;  Laterality: Bilateral;     Scheduled Meds:  aspirin  325 mg Oral Daily   atorvastatin  10 mg Oral QPM   calcium acetate  1,334 mg Oral TID WC   cinacalcet  30 mg Oral Daily   collagenase   Topical Daily   Gerhardt's butt cream   Topical Daily   heparin  5,000 Units Subcutaneous Q8H   insulin aspart  0-5 Units Subcutaneous QHS   insulin aspart  0-6 Units Subcutaneous TID WC   insulin pump   Subcutaneous TID WC, HS, 0200   lidocaine  1 patch Transdermal QHS   midodrine  5 mg Oral Q T,Th,Sat-1800   sodium chloride flush  3 mL Intravenous Q12H   Continuous Infusions: PRN Meds:.acetaminophen **OR** acetaminophen, oxyCODONE **AND** acetaminophen, Gerhardt's butt cream, polyethylene glycol  No Known Allergies  Social History   Socioeconomic History   Marital status: Single    Spouse name: Not on file   Number of children: Not on file   Years of education: Not on file   Highest education level: Not on file  Occupational History   Occupation: Retired  Tobacco Use   Smoking status: Never   Smokeless tobacco: Never  Vaping Use   Vaping Use: Never used  Substance and Sexual Activity   Alcohol use: No   Drug use: No   Sexual activity: Not Currently  Other Topics Concern   Not on file  Social History Narrative   Not on file   Social Determinants of Health   Financial Resource Strain: Not on file  Food Insecurity: Not on file  Transportation Needs: Not on file  Physical Activity: Not on file  Stress: Not on file  Social  Connections: Not on file  Intimate Partner Violence: Not on file   Family: denies   Vitals BP 135/76 (BP Location: Left Arm)   Pulse 99   Temp 99.1 F (37.3 C) (Oral)   Resp 20   Ht '5\' 5"'$  (1.651 m)   Wt 94.4 kg   SpO2 100%   BMI 34.63 kg/m   Physical Exam Constitutional:  middle aged male sitting up in bed, not in  acute distress     Comments:   Cardiovascular:     Rate and Rhythm: Normal rate and regular rhythm.     Heart sounds: systolic murmur+  Pulmonary:     Effort: Pulmonary effort is normal.     Comments:   Abdominal:     Palpations: Abdomen is soft.     Tenderness: Non tender and non distended   Musculoskeletal:        General: Bilateral heel ulcers, foul smell from rt heel ulcer+          Skin:    Comments: chronic bilateral leg swelling with chronic skin changes, difficult to feel pulses given swelling and thick skin  Neurological:     General: No focal deficit present.   Psychiatric:        Mood and Affect: Mood normal. Irritable   Pertinent Microbiology Results for orders placed or performed during the hospital encounter of 09/23/20  SARS CORONAVIRUS 2 (TAT 6-24 HRS) Nasopharyngeal Nasopharyngeal Swab     Status: None   Collection Time: 09/23/20 11:53 PM   Specimen: Nasopharyngeal Swab  Result Value Ref Range Status   SARS Coronavirus 2 NEGATIVE NEGATIVE Final    Comment: (NOTE) SARS-CoV-2 target nucleic acids are NOT DETECTED.  The SARS-CoV-2 RNA is generally detectable in upper and lower respiratory specimens during the acute phase of infection. Negative results do not preclude SARS-CoV-2 infection, do not rule out co-infections with other pathogens, and should not be used as the sole basis for treatment or other patient management decisions. Negative results must be combined with clinical observations, patient history, and epidemiological information. The expected result is Negative.  Fact Sheet for  Patients: SugarRoll.be  Fact Sheet for Healthcare Providers: https://www.woods-mathews.com/  This test is not yet approved or cleared by the [place] FDA and  has been authorized for detection and/or diagnosis of SARS-CoV-2 by FDA under an Emergency Use Authorization (EUA). This EUA will remain  in effect (meaning this test can be used) for the duration of the COVID-19 declaration under Se ction 564(b)(1) of the Act, 21 U.S.C. section 360bbb-3(b)(1), unless the authorization is terminated or revoked sooner.  Performed at [facility], [place] [address] [place], [place] [address]     Pertinent Lab seen by me: CBC Latest Ref Rng & Units 09/24/2020 09/23/2020 07/21/2020  WBC 4.0 - 10.5 K/uL 9.2 10.1 7.4  Hemoglobin 13.0 - 17.0 g/dL 9.9(L) 10.7(L) 10.2(L)  Hematocrit 39.0 - 52.0 % 33.5(L) 37.4(L) 35.8(L)  Platelets 150 - 400 K/uL 339 348 374   CMP Latest Ref Rng & Units 09/24/2020 09/23/2020 07/21/2020  Glucose 70 - 99 mg/dL 166(H) 160(H) 148(H)  BUN 6 - 20 mg/dL 20 16 26(H)  Creatinine 0.61 - 1.24 mg/dL 4.66(H) 4.17(H) 5.26(H)  Sodium 135 - 145 mmol/L 135 133(L) 135  Potassium 3.5 - 5.1 mmol/L 4.0 3.7 4.6  Chloride 98 - 111 mmol/L 97(L) 93(L) 98  CO2 22 - 32 mmol/L '25 27 25  '$ Calcium 8.9 - 10.3 mg/dL 8.7(L) 8.7(L) 8.3(L)  Total Protein 6.5 - 8.1 g/dL 6.2(L) - -  Total Bilirubin 0.3 - 1.2 mg/dL 1.3(H) - -  Alkaline Phos 38 - 126 U/L 291(H) - -  AST 15 - 41 U/L 8(L) - -  ALT 0 - 44 U/L 9 - -    Pertinent Imagings/Other Imagings Plain films and CT images have been personally visualized and interpreted; radiology reports have been reviewed. Decision making incorporated into the Impression / Recommendations.  Left  Foot Xray 09/24/20 FINDINGS: Soft tissue defect overlies the plantar aspect of the calcaneus. No convincing cortical irregularity or destruction to suggest osteomyelitis by radiograph. No radiopaque foreign body.  Hammertoe deformity of the digits. Limited assessment of the metatarsal phalangeal joints due to positioning. Generalized osteopenia. Soft tissue edema over the dorsum of the foot has improved from prior exam. Prominent vascular calcifications.   IMPRESSION: 1. Soft tissue defect overlies the plantar aspect of the calcaneus. No radiographic findings of the subjacent calcaneus to suggest osteomyelitis. 2. Osteopenia/osteoporosis. 3. Vascular calcifications.  RT Foot Xray 09/24/20 FINDINGS: Technically limited due to overlying artifact from socks. Plantar soft tissue defect overlies the calcaneus. Decreased bone density of the subjacent calcaneus suspicious for osteomyelitis. Hammertoe deformity of the digits. No acute fracture. Decreased soft tissue edema over the dorsum of the foot.   IMPRESSION: 1. Plantar soft tissue defect overlies the calcaneus. Decreased bone density of the subjacent calcaneus suspicious for osteomyelitis. 2. Technically limited evaluation due to overlying artifact from socks.   Chest Xray 09/23/20 FINDINGS: Cardiomegaly with vascular congestion and mild interstitial edema. Small right-sided pleural effusion with airspace disease at the right base. No pneumothorax.   IMPRESSION: 1. Cardiomegaly with vascular congestion, mild pulmonary edema, and small right-sided pleural effusion 2. Airspace disease at the right base which may be due to atelectasis or pneumonia  MRI left heel 09/26/20 FINDINGS: Large deep soft tissue ulceration at the posterior and plantar aspect of the left heel with ulcer base closely approximating the cortex of the posterior calcaneus. Extensive bone marrow edema throughout the posterior and plantar aspects of the calcaneal body (series 7, image 12). Extensive confluent low T1 marrow signal throughout the plantar aspect of the calcaneus is compatible with acute osteomyelitis (series 6, images 5-16). Marrow signal abnormality abuts  the calcaneal sulcus. There is a trace posterior subtalar joint effusion, nonspecific.   Preserved marrow signal of the talus, cuboid, and remaining visualized midfoot. Distal tibia and fibula are within normal limits. Trace tibiotalar joint effusion, nonspecific.   Distal Achilles tendon is intact inserting onto the posterior calcaneus. Remaining tendinous structures about the ankle are grossly intact. No tenosynovitis. Diffuse intramuscular edema of the foot musculature likely reflecting denervation and myositis. Plantar soft tissue swelling. No organized fluid collection.   IMPRESSION: 1. Large deep soft tissue ulceration at the posterior and plantar aspect of the left heel with ulcer base closely approximating the cortex of the posterior calcaneus. Extensive acute osteomyelitis of the calcaneus. 2. Trace posterior subtalar joint effusion is nonspecific. Septic arthritis not excluded. 3. Diffuse intramuscular edema of the foot musculature likely reflecting denervation and myositis.    MRI RT heel 09/26/20 FINDINGS: Bones/Joint/Cartilage   There is increased STIR signal and confluent low T1 signal along the plantar aspect of the calcaneus medially and laterally. There is disruption of the cortex. There is no significant joint effusion. There is no evidence of septic arthritis.   Ligaments   Chronic ATFL sprain. Intact anterior and posterior tibiofibular ligaments. Calcaneofibular ligament is intact. The deltoid ligament is intact. The spring ligament is intact.   Muscles and Tendons   Mild muscle atrophy. The proximal lateral plantar fascia attachment is not well visualized and likely torn. The medial bundle appears intact but with increased signal at the attachment on the calcaneus.   Soft tissues   There is a plantar soft tissue wound with extensive soft tissue swelling and small foci of susceptibility artifact.   IMPRESSION: Osteomyelitis of the plantar aspect of  the calcaneus with overlying plantar soft tissue wound. Inflammatory change in the subcutaneous tissues with possible foreign body or soft tissue emphysema. No drainable fluid collection.   Torn lateral bundle plantar fascia from the proximal attachment. Thickening of the medial bundle plantar fascia with mildly increased signal at the calcaneal attachment, which could be reactive, degenerative change, or represent involvement by the infectious process.  I spent more than 70  minutes for this patient encounter including review of prior medical records/discussing diagnostics and treatment plan with the patient/family/coordinate care with primary/other specialits with greater than 50% of time in face to face encounter.   Electronically signed by:   Rosiland Oz, MD Infectious Disease Physician [facility] Pager: [phone]

## 2020-09-29 ENCOUNTER — Encounter (HOSPITAL_COMMUNITY): Payer: Self-pay | Admitting: Surgery

## 2020-09-29 NOTE — Pre-Procedure Instructions (Signed)
SDW Call   Your procedure is scheduled on Wednesday July 13th.  Report to [facility] Main Entrance "A" at 06:30 A.M., then check in with the Admitting office.  Call this number if you have problems the morning of surgery:  [phone]    Remember:  Do not eat after midnight the night before your surgery  You may drink clear liquids until 05:30 A.M. the morning of your surgery.   Clear liquids allowed are: Water, Non-Citrus Juices (without pulp), Carbonated Beverages, Clear Tea, Black Coffee Only, and Gatorade     Take these medicines the morning of surgery with A SIP OF WATER   cycloSPORINE, PF, (CEQUA)  Doxycyline 10 MG  gabapentin (NEURONTIN)   ondansetron (ZOFRAN)- If needed  oxyCODONE-acetaminophen (PERCOCET)   metroNIDAZOLE 500 MG  As of today, STOP taking any Aspirin (unless otherwise instructed by your surgeon) Aleve, Naproxen, Ibuprofen, Motrin, Advil, Goody's, BC's, all herbal medications, fish oil, and all vitamins.  WHAT DO I DO ABOUT MY DIABETES MEDICATION?   Do not take oral diabetes medicines (pills) the morning of surgery.   If you have not received instructions from your PCP or endocrinologist on how to manage your insulin pump please contact their office. If you do not receive instructions then reduce all basal rates by 20 percent at midnight the night before your surgery.   The day of surgery, do not take other diabetes injectables, including Byetta (exenatide), Bydureon (exenatide ER), Victoza (liraglutide), or Trulicity (dulaglutide).  If your CBG is greater than 220 mg/dL, you may take  of your sliding scale (correction) dose of insulin.    Check your blood sugar the morning of your surgery when you wake up and every 2 hours until you get to the Short Stay unit.  If your blood sugar is less than 70 mg/dL, you will need to treat for low blood sugar: Do not take insulin. Treat a low blood sugar (less than 70 mg/dL) with  cup of clear juice (cranberry  or apple), 4 glucose tablets, OR glucose gel. Recheck blood sugar in 15 minutes after treatment (to make sure it is greater than 70 mg/dL). If your blood sugar is not greater than 70 mg/dL on recheck, call [phone] for further instructions. Report your blood sugar to the short stay nurse when you get to Short Stay.  If you are admitted to the hospital after surgery: Your blood sugar will be checked by the staff and you will probably be given insulin after surgery (instead of oral diabetes medicines) to make sure you have good blood sugar levels. The goal for blood sugar control after surgery is 80-180 mg/dL.                      Do NOT Smoke (Tobacco/Vaping) or drink Alcohol 24 hours prior to your procedure.  If you use a CPAP at night, you may bring all equipment for your overnight stay.   Contacts, glasses, piercing's, hearing aid's, dentures or partials may not be worn into surgery, please bring cases for these belongings.    For patients admitted to the hospital, discharge time will be determined by your treatment team.   Patients discharged the day of surgery will not be allowed to drive home, and someone needs to stay with them for 24 hours.  ONLY 1 SUPPORT PERSON MAY BE PRESENT WHILE YOU ARE IN SURGERY. IF YOU ARE TO BE ADMITTED ONCE YOU ARE IN YOUR ROOM YOU WILL BE ALLOWED TWO (2)  VISITORS.  Minor children may have two parents present. Special consideration for safety and communication needs will be reviewed on a case by case basis.   Special instructions:    Oral Hygiene is also important to reduce your risk of infection.  Remember - BRUSH YOUR TEETH THE MORNING OF SURGERY WITH YOUR REGULAR TOOTHPASTE    Day of Surgery: Shower (with antibacterial soap if possible) Do not wear jewelry, make up, nail polish, gel polish, artificial nails, or any other type of covering on natural nails including finger and toenails. If patients have artificial nails, gel coating, etc. that  need to be removed by a nail salon please have this removed prior to surgery. Surgery may need to be canceled/delayed if the surgeon/ anesthesia feels like the patient is unable to be adequately monitored. Do not wear lotions, powders, colognes, or deodorant. Men may shave face and neck. Do not bring valuables to the hospital. [facility] is not responsible for any belongings or valuables. Wear Clean/Comfortable clothing the morning of surgery Remember to brush your teeth WITH YOUR REGULAR TOOTHPASTE.   Please read over the following fact sheets that you were given.

## 2020-09-30 ENCOUNTER — Other Ambulatory Visit: Payer: Self-pay | Admitting: Surgery

## 2020-09-30 DIAGNOSIS — N2581 Secondary hyperparathyroidism of renal origin: Secondary | ICD-10-CM | POA: Diagnosis not present

## 2020-09-30 DIAGNOSIS — E119 Type 2 diabetes mellitus without complications: Secondary | ICD-10-CM | POA: Diagnosis not present

## 2020-09-30 DIAGNOSIS — E785 Hyperlipidemia, unspecified: Secondary | ICD-10-CM | POA: Diagnosis not present

## 2020-09-30 DIAGNOSIS — M86179 Other acute osteomyelitis, unspecified ankle and foot: Secondary | ICD-10-CM | POA: Diagnosis not present

## 2020-09-30 DIAGNOSIS — N186 End stage renal disease: Secondary | ICD-10-CM | POA: Diagnosis not present

## 2020-09-30 DIAGNOSIS — Z1159 Encounter for screening for other viral diseases: Secondary | ICD-10-CM | POA: Diagnosis not present

## 2020-09-30 DIAGNOSIS — D631 Anemia in chronic kidney disease: Secondary | ICD-10-CM | POA: Diagnosis not present

## 2020-09-30 NOTE — H&P (Signed)
[name]  Location: [facility] Surgery Patient #: [id number] DOB: 1965-03-24 Single / Language: Cleophus Molt / Race: Black or African American Male   History of Present Illness   The patient is a 55 year old male who presents with lymphadenopathy. He is here today for reevaluation of his inguinal lymphadenopathy.  I saw him a year ago with the same complaints.  He has multiple significant chronic comorbidities including end-stage renal disease on hemodialysis, chronic lower extremity edema, osteomyelitis, penile wound, etc.  I felt like his adenopathy was infectious last year.  It has persisted so a follow-up MRI was performed of his pelvis last fall which showed continued bilateral inguinal and iliac lymphadenopathy with the largest on the right measuring 4.2 cm.  Again it felt to be either reactive or neoplastic in nature.  Again, his primary providers have requested lymph node biopsy.  He continues to complain of poor health and lower body edema  Past Medical History:  Diagnosis Date   Anal infection      posterior anal canal   Anemia, chronic renal failure     Atrophic kidney      BILATERAL   DM type 2 causing ESRD ([place])      Nephrologist-- dr [name] [facility])--  on hemodialysis since June 2012 at  [name]  TTS   Hemodialysis patient [facility])      at [place] on Tues/ Thur/Sat/schedule   Hemorrhoids     Hepatitis B antibody positive     History of pleural effusion      bilateral   Hyperparathyroidism, secondary renal ([place])     Hypertension     Ischemic cardiomyopathy      per echo 07-01-2014  ef 45%   LAFB (left anterior fascicular block)     Peripheral neuropathy     Systolic and diastolic CHF, chronic ([place])      CARDIOLOGIST-  DR Daneen Schick ([place])  AND DR [name] ([name])           Past Surgical History:  Procedure Laterality Date   APPENDECTOMY   09-12-2004    laparotomy w/ drainage peritinitis    AV FISTULA PLACEMENT   02-27-2010    right forearm (RADIOCEPHALIC)   AV FISTULA REPAIR   10-30-2010   BONE BIOPSY Left 05/14/2020    Procedure: BONE BIOPSY;  Surgeon: Trula Slade, DPM;  Location: [facility];  Service: Podiatry;  Laterality: Left;   CARDIOVASCULAR STRESS TEST   10-29-2011   dr Daneen Schick    Low risk scan;  mild perfusion defect seen in the basal inferoseptal, basal inferior and mid inferior regions consistent with an infarct/scar and/or overlying attenuation/  mild to moderate global LVSF,  ef 40-45%   DOBUTAMINE STRESS ECHO   07-23-2012   [name]    abnormal ;  at rest estimated lvef 25-30% and global severe LV hypokinesis ;  no cp during stress and achieved 85% maxium predicted heart rate;  negative stress ECG for inducible ischemia;  estimated lvef with stress 35-40%;  augmentation of wall segments consistant with cardiomyopathy and differential fibrosis   FISTULOTOMY N/A 03/26/2015    Procedure: FISTULOTOMY;  Surgeon: [name], MD;  [facility];  Service: General;  Laterality: N/A;   GRAFT APPLICATION Left 0000000    Procedure: GRAFT APPLICATION;  Surgeon: Trula Slade, DPM;  Location: [place];  Service: Podiatry;  Laterality: Left;   GRAFT APPLICATION Bilateral  06/04/2020    Procedure: GRAFT APPLICATION;  Surgeon: Trula Slade, DPM;  Location: [facility];  Service: Podiatry;  Laterality: Bilateral;   INCISION AND DRAINAGE ABSCESS N/A 03/26/2015    Procedure: ANAL INCISION AND DRAINAGE;  Surgeon: [name], MD;  Location: [place];  Service: General;  Laterality: N/A;   RETINAL DETACHMENT SURGERY Left 2011    incomplete repair/ needs eye drops to keep pressure down   TEE WITHOUT CARDIOVERSION N/A 10/17/2017    Procedure: TRANSESOPHAGEAL ECHOCARDIOGRAM (TEE);  Surgeon: Josue Hector, MD;  Location: [facility] ENDOSCOPY;  Service: Cardiovascular;  Laterality: N/A;   TRANSTHORACIC ECHOCARDIOGRAM   07-01-2014    done at  [facility]    grade 1 diastolic dysfunction,  ef 45%/  trace TR and PR   WOUND DEBRIDEMENT Left 05/14/2020    Procedure: DEBRIDEMENT WOUND;  Surgeon: Trula Slade, DPM;  Location: [place];  Service: Podiatry;  Laterality: Left;   WOUND DEBRIDEMENT Bilateral 06/04/2020    Procedure: DEBRIDEMENT WOUND BOTH FEET;  Surgeon: Trula Slade, DPM;  Location: [facility];  Service: Podiatry;  Laterality: Bilateral;      Social History  reports that he has never smoked. He has never used smokeless tobacco. He reports that he does not drink alcohol and does not use drugs.   No Known Allergies        Family History  Problem Relation Age of Onset   Diabetes Other    Reviewed on admission Allergies  No Known Allergies    No Known Drug Allergies    Medication History (April Staton, CMA; 08/08/2020 11:06 AM) Celecoxib  ('100MG'$  Capsule, Oral) Active. Allopurinol  ('100MG'$  Tablet, Oral) Active. Aspirin  ('325MG'$  Tablet, Oral) Active. Aspirin  ('81MG'$  Tablet Chewable, Oral daily) Active. OxyCODONE HCl  ('5MG'$  Tablet, Oral) Active. Auryxia  (1 [id number] MG(Fe) Tablet, Oral daily) Active. Calcium Acetate (Phos Binder)  ('667MG'$  Capsule, Oral daily) Active. Carvedilol  (6.'25MG'$  Tablet, Oral daily) Active. Gabapentin  ('300MG'$  Capsule, Oral daily) Active. Lipitor  ('10MG'$  Tablet, Oral daily) Active. Renvela  ('800MG'$  Tablet, Oral daily) Active. Sensipar  ('60MG'$  Tablet, Oral daily) Active. Uloric  ('40MG'$  Tablet, Oral daily) Active. Istalol  (0.5% Solution, Ophthalmic daily) Active. NovoLOG  (100UNIT/ML Solution, Subcutaneous daily) Active. B-12  (100MCG Tablet, Oral daily) Active. Medications Reconciled   Vitals    Height: 65 in  Temp.: 101.4 F  (Temporal)    BP: 140/80(Sitting, Left Arm, Standard)     Physical Exam   The physical exam findings are as follows: Note:  It appears in poor health  He is obese and in a wheelchair.  There is anasarca of his lower abdomen and edema of his lower  extremities  There is shotty bilateral inguinal lymphadenopathy  Lungs mostly clear  Abdomen non tender    Assessment & Plan   INGUINAL ADENOPATHY (R59.0)  Impression: I reviewed his notes in the electronic medical records. Per his medical physicians request, we will proceed to the operating room for an excisional biopsy of a right inguinal lymph node for histologic evaluation to rule malignancy or lymphoma. I again discussed the risk procedure with him in detail. These include but are not limited to bleeding, infection, injury to surrounding structures, the lymph node being nondiagnostic, poor wound healing, further edema, etc. He understands and surgery will be scheduled

## 2020-09-30 NOTE — Anesthesia Preprocedure Evaluation (Addendum)
Anesthesia Evaluation  Patient identified by MRN, date of birth, ID band Patient awake    Reviewed: Allergy & Precautions, H&P , NPO status , Patient's Chart, lab work & pertinent test results  Airway Mallampati: II  TM Distance: >3 FB Neck ROM: Full    Dental no notable dental hx. (+) Teeth Intact, Dental Advisory Given   Pulmonary neg pulmonary ROS,    Pulmonary exam normal breath sounds clear to auscultation       Cardiovascular Exercise Tolerance: Good hypertension, + Peripheral Vascular Disease and +CHF  + dysrhythmias  Rhythm:Regular Rate:Normal     Neuro/Psych Depression negative neurological ROS     GI/Hepatic negative GI ROS, Neg liver ROS,   Endo/Other  diabetes, Poorly Controlled, Insulin Dependent  Renal/GU ESRF and DialysisRenal disease  negative genitourinary   Musculoskeletal   Abdominal   Peds  Hematology  (+) Blood dyscrasia, anemia ,   Anesthesia Other Findings   Reproductive/Obstetrics negative OB ROS                          Anesthesia Physical Anesthesia Plan  ASA: 4  Anesthesia Plan: MAC   Post-op Pain Management:    Induction: Intravenous  PONV Risk Score and Plan: 2 and Ondansetron and Midazolam  Airway Management Planned: Simple Face Mask  Additional Equipment:   Intra-op Plan:   Post-operative Plan:   Informed Consent: I have reviewed the patients History and Physical, chart, labs and discussed the procedure including the risks, benefits and alternatives for the proposed anesthesia with the patient or authorized representative who has indicated his/her understanding and acceptance.     Dental advisory given  Plan Discussed with: CRNA  Anesthesia Plan Comments: (See PAT note written 09/30/2020 by Myra Gianotti, PA-C. )      Anesthesia Quick Evaluation

## 2020-09-30 NOTE — Progress Notes (Signed)
Anesthesia Chart Review:  Case: [id number] Date/Time: 10/01/20 0815   Procedure: EXCISIONAL BIOPSY OF RIGHT INGUINAL LYMPH NODE (Right)   Anesthesia type: Monitor Anesthesia Care   Pre-op diagnosis: BILATERAL INGUINAL LYMPHADENOPATHY   Location: [place] [facility] ROOM 02 / [place] [facility]   Surgeons: [name], MD       DISCUSSION: [name] [name] is a 55 year old male scheduled for the above procedure.  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 By cardiology notes, he has  "massive bilateral lower extremity edema possibly related to compression of femoral and or iliac veins related to compression from lymph nodes".  - [place] admission 09/23/20-09/26/20 for dyspnea and edema. + Compliance with hemodialysis, although hypotension an issues. No hypoxia. BNP > 4500. Troponins flat. CXR with vascular congestion and small right pleural effusion. Nephrology consulted. Daily dialysis required initially. Echo showed a decreased LVEF to 25-30% (35-40% by reader estimate) with LV global hypokinesis, mild to moderate MR. Wound care and podiatry consulted for sacral and heel wounds. Heel MRI showed osteomyelitis. [name] [name] would not consider LE/ffot amputation. He declined further inpatient  evaluation by podiatry and vascular surgery (preferred out-[name] [name]). ID consulted and recommended Cefepime x 6 wks, Doxycycline x 6 wks and Flagyl x 2 weeks. Continue wound clinic with Dr. Dellia Nims.   Last cardiology visit with Dr. Tamala Julian 05/26/20. He wrote, "Discussed with the [name] [name] and his mother that swelling and dyspnea are related to volume overload in the setting of end-stage kidney disease and chronic hemodialysis treatment.  Dry weight is not been achievable due to low blood pressures.  Low blood pressures are related both to obstructive arterial disease and prevents adequate measurement and autonomic dysfunction from diabetes.   Overall prognosis is guarded.   Plan 1 year follow-up.  We will be happy to see more acutely if chest pain or arrhythmia.  We have no control over heart failure management as kidneys do not work and diuresis is not possible.  Low blood pressures also make heart failure medical management and possible [sic]." Next visit scheduled for 01/02/21.   Multiple co-morbidities as outlined above. He has a history of non-ischemic CM (with fluctuating LVEF over the years), but hypotension limits medication therapy, so volume status managed by hemodialysis. Has significant bilateral heal wounds but has declined LE/foot amputations, so on long-term antibiotics per ID. He is a same day work-up, so anesthesia team to evaluate on the day of surgery. He has inguinal lymphadenopathy that was initially felt infectious but has persisted, and "Again it felt to be either reactive or neoplastic in nature.  Again, his primary providers have requested lymph node biopsy." Anesthesia type is posted as MAC.   VS:  BP Readings from Last 3 Encounters:  09/26/20 (!) 149/85  07/25/20 130/76  07/21/20 128/88   Pulse Readings from Last 3 Encounters:  09/26/20 97  07/25/20 68  07/21/20 98      PROVIDERS:  [name], MD is PCP  Daneen Schick, MD is cardiologist   LABS: For ISTAT day of  surgery. Last results include:  Lab Results  Component Value Date   WBC 9.2 09/24/2020   HGB 9.9 (L) 09/24/2020   HCT 33.5 (L) 09/24/2020   PLT 339 09/24/2020   GLUCOSE 166 (H) 09/24/2020   ALT 9 09/24/2020   AST 8 (L) 09/24/2020   NA 135 09/24/2020   K 4.0 09/24/2020   CL 97 (L) 09/24/2020   CREATININE 4.66 (H) 09/24/2020   BUN 20 09/24/2020   CO2 25 09/24/2020   TSH 0.953 05/13/2020   INR 1.0 04/12/2019   HGBA1C 7.7 (H) 09/24/2020     IMAGES: MRI Right heel 09/26/20: IMPRESSION: - Osteomyelitis of the plantar aspect of the calcaneus with overlying plantar soft tissue wound. Inflammatory change in the subcutaneous tissues with possible foreign body or soft tissue emphysema. No drainable fluid collection. - Torn lateral bundle plantar fascia from the proximal attachment. Thickening of the medial bundle plantar fascia with mildly increased signal at the calcaneal attachment, which could be reactive, degenerative change, or represent involvement by the infectious process.  MRI Left heel 09/26/20: IMPRESSION: 1. Large deep soft tissue ulceration at the posterior and plantar aspect of the left heel with ulcer base closely approximating the cortex of the posterior calcaneus. Extensive acute osteomyelitis of the calcaneus. 2. Trace posterior subtalar joint effusion is nonspecific. Septic arthritis not excluded. 3. Diffuse intramuscular edema of the foot musculature likely reflecting denervation and myositis.  CXR 09/23/20:  FINDINGS: Cardiomegaly with vascular congestion and mild interstitial edema. Small right-sided pleural effusion with airspace disease at the right base. No pneumothorax. IMPRESSION: 1. Cardiomegaly with vascular congestion, mild pulmonary edema, and small right-sided pleural effusion 2. Airspace disease at the right base which may be due to atelectasis or pneumonia    EKG: 09/23/20: Sinus tachycardia at 111 bpm Possible Right ventricular  hypertrophy Nonspecific T wave abnormality Abnormal ECG Sinus tachycardia, similar to previous Confirmed by Lavenia Atlas [phone]) on 09/23/2020 7:29:35 PM   CV: Echo 09/24/20: IMPRESSIONS   1. Left ventricular ejection fraction, by estimation, is 25 to 30%. The  left ventricle has severely decreased function. The left ventricle  demonstrates global hypokinesis. The left ventricular internal cavity size  was moderately dilated. Indeterminate  diastolic filling due to E-A fusion.   2. Right ventricular systolic function is normal. The right ventricular  size is not well visualized. There is moderately elevated pulmonary artery  systolic pressure.   3. Left atrial size was mildly dilated.   4. Right atrial size was mildly dilated.   5. The mitral valve is abnormal. Mild to moderate mitral valve  regurgitation. No evidence of mitral stenosis.   6. Tricuspid valve regurgitation is moderate.   7. The aortic valve is tricuspid. There is mild calcification of the  aortic valve. There is mild thickening of the aortic valve. Aortic valve  regurgitation is not visualized. Mild aortic valve sclerosis is present,  with no evidence of aortic valve  stenosis.   8. The inferior vena cava is dilated in size with <50% respiratory  variability, suggesting right atrial pressure of 15 mmHg.  Comparison(s): Prior images reviewed side by side. Changes from prior  study are noted. Personal review of prior echo would put EF ~35-40%, but  it has dropped further since the study of 04/24/19.  - LVEF 40-45% 04/24/19; LVEF 60-65% 10/17/17 by TEE, no ASD or PFO identified; EF 35-40% 03/26/13;  EF 25-30% 07/28/12   Carotid US 10/19/17: Final Interpretation:  Right Carotid: Velocities in the right ICA are consistent with a 1-39%  stenosis. Non-hemodynamically significant plaque <50% noted in the  CCA.  Left Carotid: Velocities in the left ICA are consistent with a 1-39%  stenosis. Non-hemodynamically significant plaque  noted in the CCA.  Vertebrals:  Bilateral vertebral arteries demonstrate antegrade flow.  Subclavians: Normal flow hemodynamics were seen in the left subclavian  artery. Right subclavian artery waveforms correlate with right arm  AVF. Mild homogenous plaque noted in the mid segment of both  subclavian arteries. No evidence of focal stenosis noted throughout the bilateral  subclavian arteries.    Stress echo 09/10/15 ([facility]): SUMMARY  The [name] [name] had no chest pain during stress achieving 87% of maximum  predicted heart rate.  No segmental wall motion abnormalities at rest.  The estimated LV ejection fraction is 45-50%.  Left ventricular diastolic function: Delayed relaxation.  No segmental wall motion abnormalities with dobutamine.  All LV segments augment normally with dobutamine.  The estimated LV ejection fraction is 60-65% with stress.  Negative dobutamine echocardiography for inducible ischemia at target heart  rate.    Past Medical History:  Diagnosis Date   Anal infection    posterior anal canal   Anemia, chronic renal failure    Atrophic kidney    BILATERAL   DM type 2 causing ESRD ([place])    Nephrologist-- dr [name] [facility])--  on hemodialysis since June 2012 at  [name]  TTS   Hemodialysis [name] [name] [facility])    at [place] on Tues/ Thur/Sat/schedule   Hemorrhoids    Hepatitis B antibody positive    History of pleural effusion    bilateral   Hyperparathyroidism, secondary renal ([place])    Hypertension    Ischemic cardiomyopathy    per echo 07-01-2014  ef 45%   LAFB (left anterior fascicular block)    Peripheral neuropathy    Systolic and diastolic CHF, chronic ([place])    CARDIOLOGIST-  DR Daneen Schick ([place])  AND DR [name] ([name])    Past Surgical History:  Procedure Laterality Date   APPENDECTOMY  09-12-2004   laparotomy w/ drainage peritinitis   AV FISTULA PLACEMENT  02-27-2010   right  forearm (RADIOCEPHALIC)   AV FISTULA REPAIR  10-30-2010   BONE BIOPSY Left 05/14/2020   Procedure: BONE BIOPSY;  Surgeon: Trula Slade, DPM;  Location: [place];  Service: Podiatry;  Laterality: Left;   CARDIOVASCULAR STRESS TEST  10-29-2011   dr Daneen Schick   Low risk scan;  mild perfusion defect seen in the basal inferoseptal, basal inferior and mid inferior regions consistent with an infarct/scar and/or overlying attenuation/  mild to moderate global LVSF,  ef 40-45%   DOBUTAMINE STRESS ECHO  07-23-2012   [name]   abnormal ;  at rest estimated lvef 25-30% and global severe LV hypokinesis ;  no cp during stress and achieved 85% maxium predicted heart rate;  negative stress ECG for inducible ischemia;  estimated lvef with stress 35-40%;  augmentation of wall segments consistant with cardiomyopathy and differential fibrosis   FISTULOTOMY N/A 03/26/2015   Procedure: FISTULOTOMY;  Surgeon: [name], MD;  Location[facility];  Service: General;  Laterality: N/A;   GRAFT APPLICATION Left 5/91/6384   Procedure: GRAFT APPLICATION;  Surgeon: Trula Slade, DPM;  Location: [place];  Service: Podiatry;  Laterality: Left;   GRAFT APPLICATION  Bilateral 06/04/2020   Procedure: GRAFT APPLICATION;  Surgeon: Trula Slade, DPM;  Location: [facility];  Service: Podiatry;  Laterality: Bilateral;   INCISION AND DRAINAGE ABSCESS N/A 03/26/2015   Procedure: ANAL INCISION AND DRAINAGE;  Surgeon: [name], MD;  Location: [place];  Service: General;  Laterality: N/A;   RETINAL DETACHMENT SURGERY Left 2011   incomplete repair/ needs eye drops to keep pressure down   TEE WITHOUT CARDIOVERSION N/A 10/17/2017   Procedure: TRANSESOPHAGEAL ECHOCARDIOGRAM (TEE);  Surgeon: Josue Hector, MD;  Location: [facility] ENDOSCOPY;  Service: Cardiovascular;  Laterality: N/A;   TRANSTHORACIC ECHOCARDIOGRAM  07-01-2014    done at [facility]   grade 1 diastolic dysfunction,  ef 45%/   trace TR and PR   WOUND DEBRIDEMENT Left 05/14/2020   Procedure: DEBRIDEMENT WOUND;  Surgeon: Trula Slade, DPM;  Location: [place];  Service: Podiatry;  Laterality: Left;   WOUND DEBRIDEMENT Bilateral 06/04/2020   Procedure: DEBRIDEMENT WOUND BOTH FEET;  Surgeon: Trula Slade, DPM;  Location: [facility];  Service: Podiatry;  Laterality: Bilateral;    MEDICATIONS: No current facility-administered medications for this encounter.    aspirin 325 MG tablet   atorvastatin (LIPITOR) 10 MG tablet   calcium acetate (PHOSLO) 667 MG capsule   cinacalcet (SENSIPAR) 30 MG tablet   collagenase (SANTYL) ointment   cycloSPORINE, PF, (CEQUA) 0.09 % SOLN   febuxostat (ULORIC) 40 MG tablet   gabapentin (NEURONTIN) 300 MG capsule   Insulin Disposable Pump (OMNIPOD DASH 5 PACK PODS) MISC   insulin lispro (HUMALOG) 100 UNIT/ML injection   lidocaine (XYLOCAINE) 5 % ointment   midodrine (PROAMATINE) 5 MG tablet   Multiple Vitamin (MULTIVITAMIN WITH MINERALS) TABS tablet   mupirocin ointment (BACTROBAN) 2 %   ondansetron (ZOFRAN) 4 MG tablet   oxyCODONE-acetaminophen (PERCOCET) 10-325 MG tablet   Polyethyl Glycol-Propyl Glycol (SYSTANE) 0.4-0.3 % SOLN   sucroferric oxyhydroxide (VELPHORO) 500 MG chewable tablet   vitamin B-12 (CYANOCOBALAMIN) 500 MCG tablet   Continuous Blood Gluc Sensor (FREESTYLE LIBRE 14 DAY SENSOR) MISC   doxycycline (ADOXA) 100 MG tablet   metroNIDAZOLE (FLAGYL) 500 MG tablet    Myra Gianotti, PA-C Surgical Short Stay/Anesthesiology [facility] Phone [phone] [name] Phone [phone] 09/30/2020 12:[name] PM

## 2020-10-01 ENCOUNTER — Encounter (HOSPITAL_COMMUNITY)
Admission: RE | Disposition: A | Discharge status: Home / Self Care | Payer: Self-pay | Source: Ambulatory Visit | Attending: Surgery

## 2020-10-01 ENCOUNTER — Ambulatory Visit (HOSPITAL_COMMUNITY): Payer: Medicare Other | Admitting: Vascular Surgery

## 2020-10-01 ENCOUNTER — Encounter (HOSPITAL_COMMUNITY): Payer: Self-pay | Admitting: Surgery

## 2020-10-01 ENCOUNTER — Ambulatory Visit (HOSPITAL_COMMUNITY)
Admission: RE | Admit: 2020-10-01 | Discharge: 2020-10-01 | Disposition: A | Discharge status: Home / Self Care | Payer: Medicare Other | Source: Ambulatory Visit | Attending: Surgery | Admitting: Surgery

## 2020-10-01 ENCOUNTER — Other Ambulatory Visit: Payer: Self-pay

## 2020-10-01 DIAGNOSIS — E1165 Type 2 diabetes mellitus with hyperglycemia: Secondary | ICD-10-CM | POA: Diagnosis not present

## 2020-10-01 DIAGNOSIS — Z794 Long term (current) use of insulin: Secondary | ICD-10-CM | POA: Diagnosis not present

## 2020-10-01 DIAGNOSIS — I10 Essential (primary) hypertension: Secondary | ICD-10-CM | POA: Insufficient documentation

## 2020-10-01 DIAGNOSIS — Z7982 Long term (current) use of aspirin: Secondary | ICD-10-CM | POA: Diagnosis not present

## 2020-10-01 DIAGNOSIS — I444 Left anterior fascicular block: Secondary | ICD-10-CM | POA: Diagnosis not present

## 2020-10-01 DIAGNOSIS — R590 Localized enlarged lymph nodes: Secondary | ICD-10-CM | POA: Diagnosis not present

## 2020-10-01 DIAGNOSIS — N186 End stage renal disease: Secondary | ICD-10-CM | POA: Insufficient documentation

## 2020-10-01 DIAGNOSIS — I5042 Chronic combined systolic (congestive) and diastolic (congestive) heart failure: Secondary | ICD-10-CM | POA: Diagnosis not present

## 2020-10-01 DIAGNOSIS — E1140 Type 2 diabetes mellitus with diabetic neuropathy, unspecified: Secondary | ICD-10-CM | POA: Diagnosis not present

## 2020-10-01 DIAGNOSIS — R5381 Other malaise: Secondary | ICD-10-CM | POA: Diagnosis not present

## 2020-10-01 DIAGNOSIS — Z992 Dependence on renal dialysis: Secondary | ICD-10-CM | POA: Diagnosis not present

## 2020-10-01 DIAGNOSIS — Z79899 Other long term (current) drug therapy: Secondary | ICD-10-CM | POA: Insufficient documentation

## 2020-10-01 DIAGNOSIS — R600 Localized edema: Secondary | ICD-10-CM | POA: Insufficient documentation

## 2020-10-01 DIAGNOSIS — I898 Other specified noninfective disorders of lymphatic vessels and lymph nodes: Secondary | ICD-10-CM | POA: Insufficient documentation

## 2020-10-01 DIAGNOSIS — E1122 Type 2 diabetes mellitus with diabetic chronic kidney disease: Secondary | ICD-10-CM | POA: Insufficient documentation

## 2020-10-01 DIAGNOSIS — I959 Hypotension, unspecified: Secondary | ICD-10-CM | POA: Insufficient documentation

## 2020-10-01 DIAGNOSIS — I428 Other cardiomyopathies: Secondary | ICD-10-CM | POA: Insufficient documentation

## 2020-10-01 DIAGNOSIS — Z899 Acquired absence of limb, unspecified: Secondary | ICD-10-CM | POA: Insufficient documentation

## 2020-10-01 DIAGNOSIS — I110 Hypertensive heart disease with heart failure: Secondary | ICD-10-CM | POA: Diagnosis not present

## 2020-10-01 DIAGNOSIS — E7800 Pure hypercholesterolemia, unspecified: Secondary | ICD-10-CM | POA: Diagnosis not present

## 2020-10-01 DIAGNOSIS — Z743 Need for continuous supervision: Secondary | ICD-10-CM | POA: Diagnosis not present

## 2020-10-01 DIAGNOSIS — R531 Weakness: Secondary | ICD-10-CM | POA: Diagnosis not present

## 2020-10-01 DIAGNOSIS — Z7401 Bed confinement status: Secondary | ICD-10-CM | POA: Diagnosis not present

## 2020-10-01 DIAGNOSIS — Z79891 Long term (current) use of opiate analgesic: Secondary | ICD-10-CM | POA: Insufficient documentation

## 2020-10-01 DIAGNOSIS — F32A Depression, unspecified: Secondary | ICD-10-CM | POA: Diagnosis not present

## 2020-10-01 HISTORY — PX: INGUINAL LYMPH NODE BIOPSY: SHX5865

## 2020-10-01 LAB — GLUCOSE, CAPILLARY
Glucose-Capillary: 164 mg/dL — ABNORMAL HIGH (ref 70–99)
Glucose-Capillary: 107 mg/dL — ABNORMAL HIGH (ref 70–99)
Glucose-Capillary: 93 mg/dL (ref 70–99)

## 2020-10-01 LAB — POCT I-STAT, CHEM 8
BUN: 29 mg/dL — ABNORMAL HIGH (ref 6–20)
Calcium, Ion: 1.09 mmol/L — ABNORMAL LOW (ref 1.15–1.40)
Chloride: 99 mmol/L (ref 98–111)
Creatinine, Ser: 5.1 mg/dL — ABNORMAL HIGH (ref 0.61–1.24)
Glucose, Bld: 121 mg/dL — ABNORMAL HIGH (ref 70–99)
HCT: 36 % — ABNORMAL LOW (ref 39.0–52.0)
Hemoglobin: 12.2 g/dL — ABNORMAL LOW (ref 13.0–17.0)
Potassium: 3.9 mmol/L (ref 3.5–5.1)
Sodium: 137 mmol/L (ref 135–145)
TCO2: 27 mmol/L (ref 22–32)

## 2020-10-01 SURGERY — BIOPSY, LYMPH NODE, INGUINAL, OPEN
Anesthesia: Monitor Anesthesia Care | Site: Inguinal | Laterality: Right

## 2020-10-01 MED ORDER — LIDOCAINE-EPINEPHRINE 1 %-1:100000 IJ SOLN
INTRAMUSCULAR | Status: AC
  Filled 2020-10-01: qty 1

## 2020-10-01 MED ORDER — LIDOCAINE-EPINEPHRINE 1 %-1:100000 IJ SOLN
INTRAMUSCULAR | Status: DC | PRN
  Administered 2020-10-01: 10 mL

## 2020-10-01 MED ORDER — CHLORHEXIDINE GLUCONATE 0.12 % MT SOLN
15.0000 mL | Freq: Once | OROMUCOSAL | Status: AC
  Administered 2020-10-01: 15 mL via OROMUCOSAL
  Filled 2020-10-01: qty 15

## 2020-10-01 MED ORDER — SODIUM CHLORIDE 0.9 % IV SOLN: INTRAVENOUS | Status: DC

## 2020-10-01 MED ORDER — FENTANYL CITRATE (PF) 100 MCG/2ML IJ SOLN
25.0000 ug | INTRAMUSCULAR | Status: DC | PRN
  Administered 2020-10-01 (×4): 25 ug via INTRAVENOUS

## 2020-10-01 MED ORDER — CHLORHEXIDINE GLUCONATE CLOTH 2 % EX PADS: 6.0000 | MEDICATED_PAD | Freq: Once | CUTANEOUS | Status: DC

## 2020-10-01 MED ORDER — PHENYLEPHRINE HCL-NACL 10-0.9 MG/250ML-% IV SOLN
INTRAVENOUS | Status: DC | PRN
  Administered 2020-10-01: 50 ug/min via INTRAVENOUS

## 2020-10-01 MED ORDER — DEXMEDETOMIDINE (PRECEDEX) IN NS 20 MCG/5ML (4 MCG/ML) IV SYRINGE
PREFILLED_SYRINGE | INTRAVENOUS | Status: DC | PRN
  Administered 2020-10-01 (×2): 4 ug via INTRAVENOUS
  Administered 2020-10-01: 8 ug via INTRAVENOUS

## 2020-10-01 MED ORDER — PHENYLEPHRINE HCL (PRESSORS) 10 MG/ML IV SOLN
INTRAVENOUS | Status: DC | PRN
  Administered 2020-10-01: 80 ug via INTRAVENOUS
  Administered 2020-10-01: 120 ug via INTRAVENOUS
  Administered 2020-10-01: 200 ug via INTRAVENOUS

## 2020-10-01 MED ORDER — MIDAZOLAM HCL 2 MG/2ML IJ SOLN
INTRAMUSCULAR | Status: AC
  Filled 2020-10-01: qty 2

## 2020-10-01 MED ORDER — ACETAMINOPHEN 500 MG PO TABS
1000.0000 mg | ORAL_TABLET | ORAL | Status: AC
  Administered 2020-10-01: 1000 mg via ORAL
  Filled 2020-10-01: qty 2

## 2020-10-01 MED ORDER — CEFAZOLIN SODIUM-DEXTROSE 2-4 GM/100ML-% IV SOLN
2.0000 g | INTRAVENOUS | Status: AC
  Administered 2020-10-01: 2 g via INTRAVENOUS
  Filled 2020-10-01: qty 100

## 2020-10-01 MED ORDER — TRAMADOL HCL 50 MG PO TABS: 50.0000 mg | ORAL_TABLET | Freq: Four times a day (QID) | ORAL | 0 refills | Status: DC | PRN

## 2020-10-01 MED ORDER — ENSURE PRE-SURGERY PO LIQD: 296.0000 mL | Freq: Once | ORAL | Status: DC

## 2020-10-01 MED ORDER — FENTANYL CITRATE (PF) 250 MCG/5ML IJ SOLN
INTRAMUSCULAR | Status: AC
  Filled 2020-10-01: qty 5

## 2020-10-01 MED ORDER — ALBUMIN HUMAN 5 % IV SOLN
INTRAVENOUS | Status: AC
  Filled 2020-10-01: qty 250

## 2020-10-01 MED ORDER — PROPOFOL 10 MG/ML IV BOLUS
INTRAVENOUS | Status: AC
  Filled 2020-10-01: qty 20

## 2020-10-01 MED ORDER — PHENYLEPHRINE 40 MCG/ML (10ML) SYRINGE FOR IV PUSH (FOR BLOOD PRESSURE SUPPORT)
PREFILLED_SYRINGE | INTRAVENOUS | Status: AC
  Filled 2020-10-01: qty 10

## 2020-10-01 MED ORDER — ORAL CARE MOUTH RINSE: 15.0000 mL | Freq: Once | OROMUCOSAL | Status: AC

## 2020-10-01 MED ORDER — FENTANYL CITRATE (PF) 100 MCG/2ML IJ SOLN
INTRAMUSCULAR | Status: AC
  Filled 2020-10-01: qty 2

## 2020-10-01 MED ORDER — LIDOCAINE 2% (20 MG/ML) 5 ML SYRINGE
INTRAMUSCULAR | Status: AC
  Filled 2020-10-01: qty 5

## 2020-10-01 MED ORDER — ALBUMIN HUMAN 5 % IV SOLN
12.5000 g | Freq: Once | INTRAVENOUS | Status: AC
  Administered 2020-10-01: 12.5 g via INTRAVENOUS

## 2020-10-01 MED ORDER — FENTANYL CITRATE (PF) 250 MCG/5ML IJ SOLN
INTRAMUSCULAR | Status: DC | PRN
  Administered 2020-10-01 (×3): 50 ug via INTRAVENOUS

## 2020-10-01 MED ORDER — BUPIVACAINE-EPINEPHRINE 0.5% -1:200000 IJ SOLN
INTRAMUSCULAR | Status: AC
  Filled 2020-10-01: qty 1

## 2020-10-01 SURGICAL SUPPLY — 39 items
ADH SKN CLS APL DERMABOND .7 (GAUZE/BANDAGES/DRESSINGS) ×1
APL PRP STRL LF DISP 70% ISPRP (MISCELLANEOUS) ×1
APPLIER CLIP 9.375 MED OPEN (MISCELLANEOUS) ×2
APR CLP MED 9.3 20 MLT OPN (MISCELLANEOUS) ×1
BAG COUNTER SPONGE SURGICOUNT (BAG) IMPLANT
BAG SPNG CNTER NS LX DISP (BAG)
BLADE CLIPPER SURG (BLADE) IMPLANT
CANISTER SUCT 3000ML PPV (MISCELLANEOUS) IMPLANT
CHLORAPREP W/TINT 26 (MISCELLANEOUS) ×2 IMPLANT
CLIP APPLIE 9.375 MED OPEN (MISCELLANEOUS) IMPLANT
CNTNR URN SCR LID CUP LEK RST (MISCELLANEOUS) ×1 IMPLANT
CONT SPEC 4OZ STRL OR WHT (MISCELLANEOUS) ×2
COVER SURGICAL LIGHT HANDLE (MISCELLANEOUS) ×2 IMPLANT
DERMABOND ADVANCED (GAUZE/BANDAGES/DRESSINGS) ×1
DERMABOND ADVANCED .7 DNX12 (GAUZE/BANDAGES/DRESSINGS) ×1 IMPLANT
DRAPE LAPAROTOMY 100X72 PEDS (DRAPES) ×2 IMPLANT
ELECT REM PT RETURN 9FT ADLT (ELECTROSURGICAL) ×2
ELECTRODE REM PT RTRN 9FT ADLT (ELECTROSURGICAL) ×1 IMPLANT
GAUZE 4X4 16PLY ~~LOC~~+RFID DBL (SPONGE) ×2 IMPLANT
GLOVE SURG SIGNA 7.5 PF LTX (GLOVE) ×2 IMPLANT
GOWN STRL REUS W/ TWL LRG LVL3 (GOWN DISPOSABLE) ×1 IMPLANT
GOWN STRL REUS W/ TWL XL LVL3 (GOWN DISPOSABLE) ×1 IMPLANT
GOWN STRL REUS W/TWL LRG LVL3 (GOWN DISPOSABLE) ×2
GOWN STRL REUS W/TWL XL LVL3 (GOWN DISPOSABLE) ×2
KIT BASIN OR (CUSTOM PROCEDURE TRAY) ×2 IMPLANT
KIT TURNOVER KIT B (KITS) ×2 IMPLANT
NDL HYPO 25GX1X1/2 BEV (NEEDLE) IMPLANT
NEEDLE 22X1 1/2 (OR ONLY) (NEEDLE) ×2 IMPLANT
NEEDLE HYPO 25GX1X1/2 BEV (NEEDLE) ×2 IMPLANT
NS IRRIG 1000ML POUR BTL (IV SOLUTION) ×2 IMPLANT
PACK GENERAL/GYN (CUSTOM PROCEDURE TRAY) ×2 IMPLANT
PAD ARMBOARD 7.5X6 YLW CONV (MISCELLANEOUS) ×2 IMPLANT
PENCIL SMOKE EVACUATOR (MISCELLANEOUS) ×2 IMPLANT
SUT MNCRL AB 4-0 PS2 18 (SUTURE) ×2 IMPLANT
SUT VIC AB 3-0 SH 27 (SUTURE) ×2
SUT VIC AB 3-0 SH 27X BRD (SUTURE) ×1 IMPLANT
SYR CONTROL 10ML LL (SYRINGE) ×2 IMPLANT
TOWEL GREEN STERILE (TOWEL DISPOSABLE) ×2 IMPLANT
TOWEL GREEN STERILE FF (TOWEL DISPOSABLE) ×2 IMPLANT

## 2020-10-01 NOTE — Progress Notes (Addendum)
Patient came in short stay for surgery this AM with Insulin Pump. Per patient, he didn't ask his endocrinologist for instructions regarding insulin pump settings prior to surgery. This Probation officer called diabetic coordinator for instructions. Diabetic coordinator said that it is ok to keep insulin pump at the same rate since it will be a short procedure.

## 2020-10-01 NOTE — Interval H&P Note (Signed)
History and Physical Interval Note:no change in H and P  10/01/2020 8:03 AM  [name] [name]  has presented today for surgery, with the diagnosis of BILATERAL INGUINAL LYMPHADENOPATHY.  The various methods of treatment have been discussed with the patient and family. After consideration of risks, benefits and other options for treatment, the patient has consented to  Procedure(s): EXCISIONAL BIOPSY OF RIGHT INGUINAL LYMPH NODE (Right) as a surgical intervention.  The patient's history has been reviewed, patient examined, no change in status, stable for surgery.  I have reviewed the patient's chart and labs.  Questions were answered to the patient's satisfaction.     [name]

## 2020-10-01 NOTE — Transfer of Care (Signed)
Immediate Anesthesia Transfer of Care Note  Patient: [name] [name]  Procedure(s) Performed: EXCISIONAL BIOPSY OF RIGHT INGUINAL LYMPH NODE (Right: Inguinal)  Patient Location: PACU  Anesthesia Type:MAC  Level of Consciousness: awake, alert , oriented, drowsy and patient cooperative  Airway & Oxygen Therapy: Patient Spontanous Breathing and Patient connected to face mask oxygen  Post-op Assessment: Report given to RN and Post -op Vital signs reviewed and stable  Post vital signs: Reviewed and stable  Last Vitals:  Vitals Value Taken Time  BP 132/75 10/01/20 0918  Temp    Pulse 99 10/01/20 0918  Resp 16 10/01/20 0918  SpO2 100 % 10/01/20 0918    Last Pain:  Vitals:   10/01/20 0728  TempSrc:   PainSc: 9       Patients Stated Pain Goal: 4 (20/25/42 7062)  Complications: No notable events documented.

## 2020-10-01 NOTE — Op Note (Signed)
EXCISIONAL BIOPSY OF RIGHT INGUINAL LYMPH NODE  Procedure Note  [name] [name] 10/01/2020   Pre-op Diagnosis: BILATERAL INGUINAL LYMPHADENOPATHY     Post-op Diagnosis: same  Procedure(s): EXCISIONAL BIOPSY OF RIGHT INGUINAL LYMPH NODE  Surgeon(s): [name], MD Carlena Hurl, PA-C  Anesthesia: Monitor Anesthesia Care  Staff:  Circulator: Hal Morales, RN Scrub Person: Yves Dill D, RN  Estimated Blood Loss: Minimal               Specimens: sent to path  Indications: This is a gentleman with multiple chronic medical conditions including chronic lymphadenopathy.  His medical physicians have quested an excisional biopsy of an inguinal lymph node  Procedure: The patient was brought to the operating room identifies correct patient.  He is placed upon the operating table and anesthesia was induced.  His right inguinal area was prepped and draped in usual sterile fashion.  Anesthetized with lidocaine with epinephrine and then made a small incision with a scalpel.  I then dissected down to the palpable lymph nodes.  1 lymph node appeared most consistent with calcified fat necrosis.  I excised this area with the cautery and then believe this was a much larger lymph node which I excised as well with the cautery.  I achieved hemostasis with the cautery and surgical clips.  The lymph nodes were sent to pathology for evaluation.  I anesthetized the incision further with lidocaine.  The subcutaneous tissue was then closed interrupted 3-0 Vicryl sutures and skin was closed with running 4-0 Monocryl.  Dermabond was then applied.  The patient tolerated the procedure well.  All the counts were correct at the end of the procedure.  The patient was then taken in stable condition from the operating room to the recovery room.          [name]   Date: 10/01/2020  Time: 9:04 AM

## 2020-10-01 NOTE — Progress Notes (Signed)
Patient recovered. Waiting for transport home via [place].

## 2020-10-01 NOTE — Progress Notes (Addendum)
CBG decreased from 167 to 107 in 1 hour. Per Dr. Therisa Doyne patient removed insulin pump (unable to turn it off).

## 2020-10-01 NOTE — Discharge Instructions (Signed)
OK to shower starting tomorrow  Ice pack and tylenol also for pain

## 2020-10-01 NOTE — Anesthesia Postprocedure Evaluation (Signed)
Anesthesia Post Note  Patient: [name] [name]  Procedure(s) Performed: EXCISIONAL BIOPSY OF RIGHT INGUINAL LYMPH NODE (Right: Inguinal)     Patient location during evaluation: PACU Anesthesia Type: MAC Level of consciousness: awake and alert Pain management: pain level controlled Vital Signs Assessment: post-procedure vital signs reviewed and stable Respiratory status: spontaneous breathing, nonlabored ventilation and respiratory function stable Cardiovascular status: stable and blood pressure returned to baseline Postop Assessment: no apparent nausea or vomiting Anesthetic complications: no   No notable events documented.  Last Vitals:  Vitals:   10/01/20 1018 10/01/20 1026  BP: 101/90 119/62  Pulse: 96 98  Resp: 14 15  Temp:  (!) 36.2 C  SpO2: 99% 99%    Last Pain:  Vitals:   10/01/20 0949  TempSrc:   PainSc: 9                  Wilfrid Hyser,W. EDMOND

## 2020-10-02 ENCOUNTER — Encounter (HOSPITAL_COMMUNITY): Payer: Self-pay | Admitting: Surgery

## 2020-10-02 ENCOUNTER — Other Ambulatory Visit: Payer: Self-pay

## 2020-10-02 DIAGNOSIS — I739 Peripheral vascular disease, unspecified: Secondary | ICD-10-CM

## 2020-10-03 DIAGNOSIS — L89616 Pressure-induced deep tissue damage of right heel: Secondary | ICD-10-CM | POA: Diagnosis not present

## 2020-10-03 DIAGNOSIS — N186 End stage renal disease: Secondary | ICD-10-CM | POA: Diagnosis not present

## 2020-10-03 DIAGNOSIS — I7300 Raynaud's syndrome without gangrene: Secondary | ICD-10-CM | POA: Diagnosis not present

## 2020-10-03 DIAGNOSIS — I120 Hypertensive chronic kidney disease with stage 5 chronic kidney disease or end stage renal disease: Secondary | ICD-10-CM | POA: Diagnosis not present

## 2020-10-03 DIAGNOSIS — L89620 Pressure ulcer of left heel, unstageable: Secondary | ICD-10-CM | POA: Diagnosis not present

## 2020-10-03 DIAGNOSIS — E1122 Type 2 diabetes mellitus with diabetic chronic kidney disease: Secondary | ICD-10-CM | POA: Diagnosis not present

## 2020-10-04 DIAGNOSIS — E785 Hyperlipidemia, unspecified: Secondary | ICD-10-CM | POA: Diagnosis not present

## 2020-10-04 DIAGNOSIS — M86179 Other acute osteomyelitis, unspecified ankle and foot: Secondary | ICD-10-CM | POA: Diagnosis not present

## 2020-10-04 DIAGNOSIS — N2581 Secondary hyperparathyroidism of renal origin: Secondary | ICD-10-CM | POA: Diagnosis not present

## 2020-10-04 DIAGNOSIS — D631 Anemia in chronic kidney disease: Secondary | ICD-10-CM | POA: Diagnosis not present

## 2020-10-04 DIAGNOSIS — N186 End stage renal disease: Secondary | ICD-10-CM | POA: Diagnosis not present

## 2020-10-06 ENCOUNTER — Ambulatory Visit (HOSPITAL_COMMUNITY): Admission: RE | Admit: 2020-10-06 | Payer: Medicare Other | Source: Ambulatory Visit

## 2020-10-06 ENCOUNTER — Encounter (HOSPITAL_COMMUNITY): Payer: Self-pay

## 2020-10-06 ENCOUNTER — Telehealth: Payer: Self-pay | Admitting: Podiatry

## 2020-10-06 ENCOUNTER — Ambulatory Visit (HOSPITAL_BASED_OUTPATIENT_CLINIC_OR_DEPARTMENT_OTHER): Payer: Medicare Other | Admitting: Internal Medicine

## 2020-10-06 LAB — SURGICAL PATHOLOGY

## 2020-10-06 NOTE — Telephone Encounter (Signed)
I think this should be sent to Dr. Jacqualyn Posey.

## 2020-10-06 NOTE — Telephone Encounter (Signed)
Patient called and stated that he will be going to the ER to get his left foot amputated.   Patient wanted to know if he needed a referral. He was going to call the ambulance

## 2020-10-07 ENCOUNTER — Inpatient Hospital Stay (HOSPITAL_COMMUNITY): Payer: Medicare Other

## 2020-10-07 ENCOUNTER — Emergency Department (HOSPITAL_COMMUNITY): Payer: Medicare Other

## 2020-10-07 ENCOUNTER — Encounter (HOSPITAL_COMMUNITY): Payer: Self-pay | Admitting: Emergency Medicine

## 2020-10-07 ENCOUNTER — Inpatient Hospital Stay (HOSPITAL_COMMUNITY)
Admission: EM | Admit: 2020-10-07 | Discharge: 2020-10-29 | DRG: 616 | Disposition: A | Discharge status: Skilled Nursing Facility | Payer: Medicare Other | Attending: Internal Medicine | Admitting: Internal Medicine

## 2020-10-07 DIAGNOSIS — I132 Hypertensive heart and chronic kidney disease with heart failure and with stage 5 chronic kidney disease, or end stage renal disease: Secondary | ICD-10-CM | POA: Diagnosis present

## 2020-10-07 DIAGNOSIS — L89130 Pressure ulcer of right lower back, unstageable: Secondary | ICD-10-CM | POA: Diagnosis present

## 2020-10-07 DIAGNOSIS — E1129 Type 2 diabetes mellitus with other diabetic kidney complication: Secondary | ICD-10-CM | POA: Diagnosis present

## 2020-10-07 DIAGNOSIS — I5020 Unspecified systolic (congestive) heart failure: Secondary | ICD-10-CM | POA: Diagnosis not present

## 2020-10-07 DIAGNOSIS — E1122 Type 2 diabetes mellitus with diabetic chronic kidney disease: Secondary | ICD-10-CM | POA: Diagnosis present

## 2020-10-07 DIAGNOSIS — M86679 Other chronic osteomyelitis, unspecified ankle and foot: Secondary | ICD-10-CM

## 2020-10-07 DIAGNOSIS — J9811 Atelectasis: Secondary | ICD-10-CM | POA: Diagnosis not present

## 2020-10-07 DIAGNOSIS — E875 Hyperkalemia: Secondary | ICD-10-CM | POA: Diagnosis present

## 2020-10-07 DIAGNOSIS — Z20822 Contact with and (suspected) exposure to covid-19: Secondary | ICD-10-CM | POA: Diagnosis present

## 2020-10-07 DIAGNOSIS — L97518 Non-pressure chronic ulcer of other part of right foot with other specified severity: Secondary | ICD-10-CM | POA: Diagnosis present

## 2020-10-07 DIAGNOSIS — T8612 Kidney transplant failure: Secondary | ICD-10-CM | POA: Diagnosis present

## 2020-10-07 DIAGNOSIS — Z8679 Personal history of other diseases of the circulatory system: Secondary | ICD-10-CM | POA: Diagnosis not present

## 2020-10-07 DIAGNOSIS — Z992 Dependence on renal dialysis: Secondary | ICD-10-CM | POA: Diagnosis not present

## 2020-10-07 DIAGNOSIS — R531 Weakness: Secondary | ICD-10-CM | POA: Diagnosis not present

## 2020-10-07 DIAGNOSIS — I10 Essential (primary) hypertension: Secondary | ICD-10-CM | POA: Diagnosis not present

## 2020-10-07 DIAGNOSIS — M86672 Other chronic osteomyelitis, left ankle and foot: Secondary | ICD-10-CM | POA: Diagnosis present

## 2020-10-07 DIAGNOSIS — E11628 Type 2 diabetes mellitus with other skin complications: Secondary | ICD-10-CM | POA: Diagnosis not present

## 2020-10-07 DIAGNOSIS — S91301A Unspecified open wound, right foot, initial encounter: Secondary | ICD-10-CM | POA: Diagnosis not present

## 2020-10-07 DIAGNOSIS — E8809 Other disorders of plasma-protein metabolism, not elsewhere classified: Secondary | ICD-10-CM | POA: Diagnosis present

## 2020-10-07 DIAGNOSIS — E1169 Type 2 diabetes mellitus with other specified complication: Principal | ICD-10-CM | POA: Diagnosis present

## 2020-10-07 DIAGNOSIS — M8600 Acute hematogenous osteomyelitis, unspecified site: Secondary | ICD-10-CM | POA: Diagnosis not present

## 2020-10-07 DIAGNOSIS — E1165 Type 2 diabetes mellitus with hyperglycemia: Secondary | ICD-10-CM | POA: Diagnosis not present

## 2020-10-07 DIAGNOSIS — Z794 Long term (current) use of insulin: Secondary | ICD-10-CM | POA: Diagnosis not present

## 2020-10-07 DIAGNOSIS — G8918 Other acute postprocedural pain: Secondary | ICD-10-CM | POA: Diagnosis not present

## 2020-10-07 DIAGNOSIS — E43 Unspecified severe protein-calorie malnutrition: Secondary | ICD-10-CM | POA: Diagnosis present

## 2020-10-07 DIAGNOSIS — D631 Anemia in chronic kidney disease: Secondary | ICD-10-CM | POA: Diagnosis present

## 2020-10-07 DIAGNOSIS — E1142 Type 2 diabetes mellitus with diabetic polyneuropathy: Secondary | ICD-10-CM | POA: Diagnosis present

## 2020-10-07 DIAGNOSIS — N2581 Secondary hyperparathyroidism of renal origin: Secondary | ICD-10-CM | POA: Diagnosis present

## 2020-10-07 DIAGNOSIS — R5381 Other malaise: Secondary | ICD-10-CM | POA: Diagnosis not present

## 2020-10-07 DIAGNOSIS — E1152 Type 2 diabetes mellitus with diabetic peripheral angiopathy with gangrene: Secondary | ICD-10-CM | POA: Diagnosis present

## 2020-10-07 DIAGNOSIS — Z6833 Body mass index (BMI) 33.0-33.9, adult: Secondary | ICD-10-CM | POA: Diagnosis not present

## 2020-10-07 DIAGNOSIS — L97528 Non-pressure chronic ulcer of other part of left foot with other specified severity: Secondary | ICD-10-CM | POA: Diagnosis present

## 2020-10-07 DIAGNOSIS — L089 Local infection of the skin and subcutaneous tissue, unspecified: Secondary | ICD-10-CM | POA: Diagnosis not present

## 2020-10-07 DIAGNOSIS — L89150 Pressure ulcer of sacral region, unstageable: Secondary | ICD-10-CM | POA: Diagnosis present

## 2020-10-07 DIAGNOSIS — M869 Osteomyelitis, unspecified: Secondary | ICD-10-CM

## 2020-10-07 DIAGNOSIS — Y830 Surgical operation with transplant of whole organ as the cause of abnormal reaction of the patient, or of later complication, without mention of misadventure at the time of the procedure: Secondary | ICD-10-CM | POA: Diagnosis present

## 2020-10-07 DIAGNOSIS — M86171 Other acute osteomyelitis, right ankle and foot: Secondary | ICD-10-CM | POA: Diagnosis present

## 2020-10-07 DIAGNOSIS — Z7982 Long term (current) use of aspirin: Secondary | ICD-10-CM

## 2020-10-07 DIAGNOSIS — M86172 Other acute osteomyelitis, left ankle and foot: Secondary | ICD-10-CM | POA: Diagnosis present

## 2020-10-07 DIAGNOSIS — G253 Myoclonus: Secondary | ICD-10-CM | POA: Diagnosis present

## 2020-10-07 DIAGNOSIS — L89310 Pressure ulcer of right buttock, unstageable: Secondary | ICD-10-CM | POA: Diagnosis present

## 2020-10-07 DIAGNOSIS — L02619 Cutaneous abscess of unspecified foot: Secondary | ICD-10-CM

## 2020-10-07 DIAGNOSIS — L02611 Cutaneous abscess of right foot: Secondary | ICD-10-CM

## 2020-10-07 DIAGNOSIS — N186 End stage renal disease: Secondary | ICD-10-CM | POA: Diagnosis present

## 2020-10-07 DIAGNOSIS — M7989 Other specified soft tissue disorders: Secondary | ICD-10-CM | POA: Diagnosis not present

## 2020-10-07 DIAGNOSIS — I509 Heart failure, unspecified: Secondary | ICD-10-CM

## 2020-10-07 DIAGNOSIS — E8779 Other fluid overload: Secondary | ICD-10-CM | POA: Diagnosis not present

## 2020-10-07 DIAGNOSIS — R0600 Dyspnea, unspecified: Secondary | ICD-10-CM | POA: Diagnosis not present

## 2020-10-07 DIAGNOSIS — M86671 Other chronic osteomyelitis, right ankle and foot: Secondary | ICD-10-CM | POA: Diagnosis present

## 2020-10-07 DIAGNOSIS — R404 Transient alteration of awareness: Secondary | ICD-10-CM | POA: Diagnosis not present

## 2020-10-07 DIAGNOSIS — S90811A Abrasion, right foot, initial encounter: Secondary | ICD-10-CM

## 2020-10-07 DIAGNOSIS — L89140 Pressure ulcer of left lower back, unstageable: Secondary | ICD-10-CM | POA: Diagnosis present

## 2020-10-07 DIAGNOSIS — E11621 Type 2 diabetes mellitus with foot ulcer: Secondary | ICD-10-CM | POA: Diagnosis present

## 2020-10-07 DIAGNOSIS — M86279 Subacute osteomyelitis, unspecified ankle and foot: Secondary | ICD-10-CM

## 2020-10-07 DIAGNOSIS — L89890 Pressure ulcer of other site, unstageable: Secondary | ICD-10-CM | POA: Diagnosis present

## 2020-10-07 DIAGNOSIS — Z833 Family history of diabetes mellitus: Secondary | ICD-10-CM

## 2020-10-07 DIAGNOSIS — R0602 Shortness of breath: Secondary | ICD-10-CM | POA: Diagnosis not present

## 2020-10-07 DIAGNOSIS — N250 Renal osteodystrophy: Secondary | ICD-10-CM | POA: Diagnosis not present

## 2020-10-07 DIAGNOSIS — L89320 Pressure ulcer of left buttock, unstageable: Secondary | ICD-10-CM | POA: Diagnosis present

## 2020-10-07 DIAGNOSIS — K5900 Constipation, unspecified: Secondary | ICD-10-CM | POA: Diagnosis present

## 2020-10-07 DIAGNOSIS — Z7189 Other specified counseling: Secondary | ICD-10-CM

## 2020-10-07 DIAGNOSIS — Z9641 Presence of insulin pump (external) (internal): Secondary | ICD-10-CM | POA: Diagnosis present

## 2020-10-07 DIAGNOSIS — I5023 Acute on chronic systolic (congestive) heart failure: Secondary | ICD-10-CM | POA: Diagnosis present

## 2020-10-07 DIAGNOSIS — S90812A Abrasion, left foot, initial encounter: Secondary | ICD-10-CM | POA: Diagnosis not present

## 2020-10-07 DIAGNOSIS — Z79891 Long term (current) use of opiate analgesic: Secondary | ICD-10-CM

## 2020-10-07 DIAGNOSIS — Z79899 Other long term (current) drug therapy: Secondary | ICD-10-CM

## 2020-10-07 DIAGNOSIS — Z751 Person awaiting admission to adequate facility elsewhere: Secondary | ICD-10-CM

## 2020-10-07 DIAGNOSIS — Z743 Need for continuous supervision: Secondary | ICD-10-CM | POA: Diagnosis not present

## 2020-10-07 DIAGNOSIS — E11649 Type 2 diabetes mellitus with hypoglycemia without coma: Secondary | ICD-10-CM | POA: Diagnosis not present

## 2020-10-07 DIAGNOSIS — R079 Chest pain, unspecified: Secondary | ICD-10-CM | POA: Diagnosis not present

## 2020-10-07 LAB — PROTIME-INR
INR: 1.3 — ABNORMAL HIGH (ref 0.8–1.2)
Prothrombin Time: 15.9 seconds — ABNORMAL HIGH (ref 11.4–15.2)

## 2020-10-07 LAB — CBC WITH DIFFERENTIAL/PLATELET
Abs Immature Granulocytes: 0.06 10*3/uL (ref 0.00–0.07)
Basophils Absolute: 0.1 10*3/uL (ref 0.0–0.1)
Basophils Relative: 1 %
Eosinophils Absolute: 0.1 10*3/uL (ref 0.0–0.5)
Eosinophils Relative: 1 %
HCT: 31.6 % — ABNORMAL LOW (ref 39.0–52.0)
Hemoglobin: 9 g/dL — ABNORMAL LOW (ref 13.0–17.0)
Immature Granulocytes: 1 %
Lymphocytes Relative: 6 %
Lymphs Abs: 0.6 10*3/uL — ABNORMAL LOW (ref 0.7–4.0)
MCH: 28.5 pg (ref 26.0–34.0)
MCHC: 28.5 g/dL — ABNORMAL LOW (ref 30.0–36.0)
MCV: 100 fL (ref 80.0–100.0)
Monocytes Absolute: 0.6 10*3/uL (ref 0.1–1.0)
Monocytes Relative: 5 %
Neutro Abs: 8.8 10*3/uL — ABNORMAL HIGH (ref 1.7–7.7)
Neutrophils Relative %: 86 %
Platelets: 357 10*3/uL (ref 150–400)
RBC: 3.16 MIL/uL — ABNORMAL LOW (ref 4.22–5.81)
RDW: 16 % — ABNORMAL HIGH (ref 11.5–15.5)
WBC: 10.1 10*3/uL (ref 4.0–10.5)
nRBC: 0.2 % (ref 0.0–0.2)

## 2020-10-07 LAB — LACTIC ACID, PLASMA
Lactic Acid, Venous: 2.2 mmol/L (ref 0.5–1.9)
Lactic Acid, Venous: 2.6 mmol/L (ref 0.5–1.9)

## 2020-10-07 LAB — COMPREHENSIVE METABOLIC PANEL
ALT: <5 U/L (ref 0–44)
AST: 13 U/L — ABNORMAL LOW (ref 15–41)
Albumin: 1.8 g/dL — ABNORMAL LOW (ref 3.5–5.0)
Alkaline Phosphatase: 427 U/L — ABNORMAL HIGH (ref 38–126)
Anion gap: 12 (ref 5–15)
BUN: 40 mg/dL — ABNORMAL HIGH (ref 6–20)
CO2: 26 mmol/L (ref 22–32)
Calcium: 8.3 mg/dL — ABNORMAL LOW (ref 8.9–10.3)
Chloride: 94 mmol/L — ABNORMAL LOW (ref 98–111)
Creatinine, Ser: 6.98 mg/dL — ABNORMAL HIGH (ref 0.61–1.24)
GFR, Estimated: 9 mL/min — ABNORMAL LOW (ref >60)
Glucose, Bld: 366 mg/dL — ABNORMAL HIGH (ref 70–99)
Potassium: 5.3 mmol/L — ABNORMAL HIGH (ref 3.5–5.1)
Sodium: 132 mmol/L — ABNORMAL LOW (ref 135–145)
Total Bilirubin: 1.1 mg/dL (ref 0.3–1.2)
Total Protein: 6.4 g/dL — ABNORMAL LOW (ref 6.5–8.1)

## 2020-10-07 LAB — IRON AND TIBC
Iron: 43 ug/dL — ABNORMAL LOW (ref 45–182)
Saturation Ratios: 37 % (ref 17.9–39.5)
TIBC: 118 ug/dL — ABNORMAL LOW (ref 250–450)
UIBC: 75 ug/dL

## 2020-10-07 LAB — CBG MONITORING, ED: Glucose-Capillary: 356 mg/dL — ABNORMAL HIGH (ref 70–99)

## 2020-10-07 LAB — SARS CORONAVIRUS 2 (TAT 6-24 HRS): SARS Coronavirus 2: NEGATIVE

## 2020-10-07 MED ORDER — HEPARIN SODIUM (PORCINE) 5000 UNIT/ML IJ SOLN
5000.0000 [IU] | Freq: Three times a day (TID) | INTRAMUSCULAR | Status: DC
  Administered 2020-10-08 – 2020-10-29 (×55): 5000 [IU] via SUBCUTANEOUS
  Filled 2020-10-07 (×55): qty 1

## 2020-10-07 MED ORDER — SODIUM ZIRCONIUM CYCLOSILICATE 10 G PO PACK
10.0000 g | PACK | Freq: Once | ORAL | Status: AC
  Administered 2020-10-07: 10 g via ORAL
  Filled 2020-10-07: qty 1

## 2020-10-07 MED ORDER — TRAMADOL HCL 50 MG PO TABS: 50.0000 mg | ORAL_TABLET | Freq: Four times a day (QID) | ORAL | Status: DC | PRN

## 2020-10-07 MED ORDER — FUROSEMIDE 20 MG PO TABS
20.0000 mg | ORAL_TABLET | Freq: Once | ORAL | Status: AC
  Administered 2020-10-07: 20 mg via ORAL
  Filled 2020-10-07: qty 1

## 2020-10-07 MED ORDER — TRAMADOL HCL 50 MG PO TABS
100.0000 mg | ORAL_TABLET | Freq: Two times a day (BID) | ORAL | Status: DC | PRN
Start: 2020-10-07 — End: 2020-10-12

## 2020-10-07 MED ORDER — ASPIRIN 325 MG PO TABS
325.0000 mg | ORAL_TABLET | Freq: Every day | ORAL | Status: DC
  Administered 2020-10-08 – 2020-10-29 (×18): 325 mg via ORAL
  Filled 2020-10-07 (×20): qty 1

## 2020-10-07 MED ORDER — CHLORHEXIDINE GLUCONATE CLOTH 2 % EX PADS
6.0000 | MEDICATED_PAD | Freq: Every day | CUTANEOUS | Status: DC
  Administered 2020-10-09 – 2020-10-27 (×11): 6 via TOPICAL

## 2020-10-07 MED ORDER — DARBEPOETIN ALFA 100 MCG/0.5ML IJ SOSY
100.0000 ug | PREFILLED_SYRINGE | INTRAMUSCULAR | Status: DC
  Filled 2020-10-07: qty 0.5

## 2020-10-07 MED ORDER — OXYCODONE-ACETAMINOPHEN 10-325 MG PO TABS: 1.0000 | ORAL_TABLET | Freq: Every day | ORAL | Status: DC

## 2020-10-07 MED ORDER — SODIUM CHLORIDE 0.9 % IV SOLN: 100.0000 mL | INTRAVENOUS | Status: DC | PRN

## 2020-10-07 MED ORDER — ALTEPLASE 2 MG IJ SOLR: 2.0000 mg | Freq: Once | INTRAMUSCULAR | Status: DC | PRN

## 2020-10-07 MED ORDER — MIDODRINE HCL 5 MG PO TABS
5.0000 mg | ORAL_TABLET | ORAL | Status: DC
  Administered 2020-10-09 – 2020-10-18 (×4): 5 mg via ORAL
  Filled 2020-10-07 (×5): qty 1

## 2020-10-07 MED ORDER — CALCIUM ACETATE (PHOS BINDER) 667 MG PO CAPS
1334.0000 mg | ORAL_CAPSULE | Freq: Three times a day (TID) | ORAL | Status: DC
  Administered 2020-10-08 – 2020-10-25 (×15): 1334 mg via ORAL
  Filled 2020-10-07 (×29): qty 2

## 2020-10-07 MED ORDER — ACETAMINOPHEN 650 MG RE SUPP: 650.0000 mg | Freq: Four times a day (QID) | RECTAL | Status: DC | PRN

## 2020-10-07 MED ORDER — LIDOCAINE-PRILOCAINE 2.5-2.5 % EX CREA
1.0000 "application " | TOPICAL_CREAM | CUTANEOUS | Status: DC | PRN
  Filled 2020-10-07: qty 5

## 2020-10-07 MED ORDER — ALBUMIN HUMAN 25 % IV SOLN
25.0000 g | Freq: Four times a day (QID) | INTRAVENOUS | Status: AC
  Administered 2020-10-07 – 2020-10-10 (×5): 25 g via INTRAVENOUS
  Filled 2020-10-07 (×7): qty 100

## 2020-10-07 MED ORDER — LIDOCAINE 5 % EX OINT
1.0000 "application " | TOPICAL_OINTMENT | Freq: Two times a day (BID) | CUTANEOUS | Status: DC | PRN
  Administered 2020-10-29: 1 via TOPICAL
  Filled 2020-10-07: qty 35.44

## 2020-10-07 MED ORDER — LIDOCAINE HCL (PF) 1 % IJ SOLN: 5.0000 mL | INTRAMUSCULAR | Status: DC | PRN

## 2020-10-07 MED ORDER — HEPARIN SODIUM (PORCINE) 1000 UNIT/ML DIALYSIS
1000.0000 [IU] | INTRAMUSCULAR | Status: DC | PRN
  Filled 2020-10-07: qty 1

## 2020-10-07 MED ORDER — CYCLOSPORINE 0.05 % OP EMUL
1.0000 [drp] | Freq: Every day | OPHTHALMIC | Status: DC
  Administered 2020-10-08 – 2020-10-29 (×20): 1 [drp] via OPHTHALMIC
  Filled 2020-10-07 (×22): qty 1

## 2020-10-07 MED ORDER — POLYETHYLENE GLYCOL 3350 17 G PO PACK: 17.0000 g | PACK | Freq: Every day | ORAL | Status: DC | PRN

## 2020-10-07 MED ORDER — HEPARIN SODIUM (PORCINE) 5000 UNIT/ML IJ SOLN: 5000.0000 [IU] | Freq: Two times a day (BID) | INTRAMUSCULAR | Status: DC

## 2020-10-07 MED ORDER — SUCROFERRIC OXYHYDROXIDE 500 MG PO CHEW
500.0000 mg | CHEWABLE_TABLET | Freq: Every day | ORAL | Status: DC
  Administered 2020-10-09 – 2020-10-26 (×4): 500 mg via ORAL
  Filled 2020-10-07 (×23): qty 1

## 2020-10-07 MED ORDER — VANCOMYCIN HCL 10 G IV SOLR
2250.0000 mg | Freq: Once | INTRAVENOUS | Status: AC
  Administered 2020-10-07: 2250 mg via INTRAVENOUS
  Filled 2020-10-07: qty 2250

## 2020-10-07 MED ORDER — GABAPENTIN 300 MG PO CAPS
300.0000 mg | ORAL_CAPSULE | Freq: Three times a day (TID) | ORAL | Status: DC
  Administered 2020-10-07 – 2020-10-09 (×4): 300 mg via ORAL
  Filled 2020-10-07 (×4): qty 1

## 2020-10-07 MED ORDER — MUPIROCIN 2 % EX OINT: 1.0000 "application " | TOPICAL_OINTMENT | Freq: Every day | CUTANEOUS | Status: DC | PRN

## 2020-10-07 MED ORDER — PIPERACILLIN-TAZOBACTAM 3.375 G IVPB 30 MIN
3.3750 g | Freq: Once | INTRAVENOUS | Status: AC
Start: 2020-10-07 — End: 2020-10-07
  Administered 2020-10-07: 3.375 g via INTRAVENOUS
  Filled 2020-10-07: qty 50

## 2020-10-07 MED ORDER — CINACALCET HCL 30 MG PO TABS
30.0000 mg | ORAL_TABLET | Freq: Every day | ORAL | Status: DC
  Administered 2020-10-08 – 2020-10-29 (×19): 30 mg via ORAL
  Filled 2020-10-07 (×20): qty 1

## 2020-10-07 MED ORDER — ATORVASTATIN CALCIUM 10 MG PO TABS
10.0000 mg | ORAL_TABLET | Freq: Every evening | ORAL | Status: DC
  Administered 2020-10-07 – 2020-10-29 (×22): 10 mg via ORAL
  Filled 2020-10-07 (×23): qty 1

## 2020-10-07 MED ORDER — ONDANSETRON HCL 4 MG PO TABS
4.0000 mg | ORAL_TABLET | Freq: Three times a day (TID) | ORAL | Status: DC | PRN
  Administered 2020-10-19 – 2020-10-29 (×3): 4 mg via ORAL
  Filled 2020-10-07 (×3): qty 1

## 2020-10-07 MED ORDER — INSULIN ASPART 100 UNIT/ML IJ SOLN: 0.0000 [IU] | Freq: Three times a day (TID) | INTRAMUSCULAR | Status: DC

## 2020-10-07 MED ORDER — PENTAFLUOROPROP-TETRAFLUOROETH EX AERO
1.0000 "application " | INHALATION_SPRAY | CUTANEOUS | Status: DC | PRN
  Filled 2020-10-07: qty 116

## 2020-10-07 MED ORDER — HYDROMORPHONE HCL 1 MG/ML IJ SOLN
0.5000 mg | Freq: Once | INTRAMUSCULAR | Status: AC
  Administered 2020-10-07: 0.5 mg via INTRAVENOUS
  Filled 2020-10-07: qty 1

## 2020-10-07 MED ORDER — PROSOURCE PLUS PO LIQD
30.0000 mL | Freq: Two times a day (BID) | ORAL | Status: DC
  Administered 2020-10-09 – 2020-10-26 (×10): 30 mL via ORAL
  Filled 2020-10-07 (×21): qty 30

## 2020-10-07 MED ORDER — POLYVINYL ALCOHOL 1.4 % OP SOLN: 1.0000 [drp] | Freq: Every day | OPHTHALMIC | Status: DC | PRN

## 2020-10-07 MED ORDER — HYDROMORPHONE HCL 1 MG/ML IJ SOLN
0.5000 mg | INTRAMUSCULAR | Status: DC | PRN
  Administered 2020-10-07 – 2020-10-12 (×11): 1 mg via INTRAVENOUS
  Filled 2020-10-07 (×10): qty 1

## 2020-10-07 MED ORDER — INSULIN PUMP
Freq: Three times a day (TID) | SUBCUTANEOUS | Status: DC
  Filled 2020-10-07: qty 1

## 2020-10-07 MED ORDER — ACETAMINOPHEN 325 MG PO TABS
650.0000 mg | ORAL_TABLET | Freq: Four times a day (QID) | ORAL | Status: DC | PRN
  Administered 2020-10-09 – 2020-10-26 (×6): 650 mg via ORAL
  Filled 2020-10-07 (×7): qty 2

## 2020-10-07 NOTE — ED Triage Notes (Signed)
Pt here from home with c/o wounds to both heals , unable to walk , pt is due for dialysis for today

## 2020-10-07 NOTE — ED Notes (Signed)
Provider notified of pt request for pain meds.

## 2020-10-07 NOTE — Telephone Encounter (Addendum)
Spoke to [name] [name] and his mother and he is going to head to [facility] ED today.  Per Dr. Jacqualyn Posey he needs to go to [facility] ED since he is on dialysis. I tried calling [facility] numbers but couldn't get an answer. Let a message on his mothers number to go to [facility] ED.

## 2020-10-07 NOTE — ED Notes (Signed)
Unable to draw 2nd blood culture and repeat lactic acid as pt is a hard stick despite multiple attempts.

## 2020-10-07 NOTE — ED Provider Notes (Signed)
Diabetic nurse recommended removing OmniPod and considering Lantus 10 units daily with NovoLog 0 -6 units every 4 hours   Milton Ferguson, MD 10/07/20 1642

## 2020-10-07 NOTE — ED Notes (Signed)
Attempted IV to left AC due to restrictions to right arm for dialysis. Unsuccessful attempt x 1. Pt states he always gets IV team as he is a hard stick. IV team consulted at this time.

## 2020-10-07 NOTE — H&P (Signed)
History and Physical    RAYLEE MADANI [id number] DOB: [birth year] DOA: 10/07/2020  PCP: [name], MD (Confirm with patient/family/NH records and if not entered, this has to be entered at [facility] point of entry) Patient coming from: [facility]  I have personally briefly reviewed patient's old medical records in [place]  Chief Complaint: Right leg pain  HPI: [name] [name] is a 55 y.o. male with medical history significant of chronic bilateral heel osteomyelitis, chronic systolic CHF LVEF 25 to [id number] in 2022, ESRD on HD, orthostatic hypotension on midodrine, IDDM, chronic sacral wound, HLD, chronic lymphadenopathy on the legs, upper extremity arterial thrombosis, presented with worsening of bilateral legs and feet pain.  Patient was recently hospitalized for worsening of bilateral heel wound, and CHF decompensation.  MRI showed extensive infection and destruction of bilateral feet, podiatry Dr. Earleen Newport recommended amputation for infection source control.  Patient and his family however declined.  As outpatient, patient continue to evaluate by podiatry and continue to receive p.o. antibiotics doxycycline and Flagyl.  However patient continued to experience bilateral lower extremity severe pain, unable to ambulate.  Denies any fever chills. Today, patient came in for unbearable pain of bilateral lower extremity right> left.  Patient reported that he has had episodes of extensive bleeding of bilateral feet wound at home.  ED Course: Patient is afebrile, blood pressure borderline low.  No tachycardia, ED started patient on vancomycin and Zosyn.  Review of Systems: As per HPI otherwise 14 point review of systems negative.    Past Medical History:  Diagnosis Date   Anal infection    posterior anal canal   Anemia, chronic renal failure    Atrophic kidney    BILATERAL   DM type 2 causing ESRD ([place])    Nephrologist-- dr [name] [facility])--  on hemodialysis since June 2012 at   [name]  TTS   Hemodialysis patient [facility])    at [place] on Tues/ Thur/Sat/schedule   Hemorrhoids    Hepatitis B antibody positive    History of pleural effusion    bilateral   Hyperparathyroidism, secondary renal ([place])    Hypertension    Ischemic cardiomyopathy    per echo 07-01-2014  ef 45%   LAFB (left anterior fascicular block)    Peripheral neuropathy    Systolic and diastolic CHF, chronic ([place])    CARDIOLOGIST-  DR Daneen Schick ([place])  AND DR [name] ([name])    Past Surgical History:  Procedure Laterality Date   APPENDECTOMY  09-12-2004   laparotomy w/ drainage peritinitis   AV FISTULA PLACEMENT  02-27-2010   right forearm (RADIOCEPHALIC)   AV FISTULA REPAIR  10-30-2010   BONE BIOPSY Left 05/14/2020   Procedure: BONE BIOPSY;  Surgeon: Trula Slade, DPM;  Location: [place];  Service: Podiatry;  Laterality: Left;   CARDIOVASCULAR STRESS TEST  10-29-2011   dr Daneen Schick   Low risk scan;  mild perfusion defect seen in the basal inferoseptal, basal inferior and mid inferior regions consistent with an infarct/scar and/or overlying attenuation/  mild to moderate global LVSF,  ef 40-45%   DOBUTAMINE STRESS ECHO  07-23-2012   [name]   abnormal ;  at rest estimated lvef 25-30% and global severe LV hypokinesis ;  no cp during stress and achieved 85% maxium predicted heart rate;  negative stress ECG for inducible ischemia;  estimated lvef with stress 35-40%;  augmentation of wall segments consistant with cardiomyopathy and differential  fibrosis   FISTULOTOMY N/A 03/26/2015   Procedure: FISTULOTOMY;  Surgeon: [name], MD;  Location: [facility];  Service: General;  Laterality: N/A;   GRAFT APPLICATION Left 0000000   Procedure: GRAFT APPLICATION;  Surgeon: Trula Slade, DPM;  Location: [place];  Service: Podiatry;  Laterality: Left;   GRAFT APPLICATION Bilateral 0000000   Procedure: GRAFT  APPLICATION;  Surgeon: Trula Slade, DPM;  Location: [facility];  Service: Podiatry;  Laterality: Bilateral;   INCISION AND DRAINAGE ABSCESS N/A 03/26/2015   Procedure: ANAL INCISION AND DRAINAGE;  Surgeon: [name], MD;  Location: [place];  Service: General;  Laterality: N/A;   INGUINAL LYMPH NODE BIOPSY Right 10/01/2020   Procedure: EXCISIONAL BIOPSY OF RIGHT INGUINAL LYMPH NODE;  Surgeon: [name], MD;  Location: [place];  Service: General;  Laterality: Right;   RETINAL DETACHMENT SURGERY Left 2011   incomplete repair/ needs eye drops to keep pressure down   TEE WITHOUT CARDIOVERSION N/A 10/17/2017   Procedure: TRANSESOPHAGEAL ECHOCARDIOGRAM (TEE);  Surgeon: Josue Hector, MD;  Location[facility];  Service: Cardiovascular;  Laterality: N/A;   TRANSTHORACIC ECHOCARDIOGRAM  07-01-2014    done at [facility]   grade 1 diastolic dysfunction,  ef 45%/  trace TR and PR   WOUND DEBRIDEMENT Left 05/14/2020   Procedure: DEBRIDEMENT WOUND;  Surgeon: Trula Slade, DPM;  Location: [place];  Service: Podiatry;  Laterality: Left;   WOUND DEBRIDEMENT Bilateral 06/04/2020   Procedure: DEBRIDEMENT WOUND BOTH FEET;  Surgeon: Trula Slade, DPM;  Location: [facility];  Service: Podiatry;  Laterality: Bilateral;     reports that he has never smoked. He has never used smokeless tobacco. He reports that he does not drink alcohol and does not use drugs.  No Known Allergies  Family History  Problem Relation Age of Onset   Diabetes Other      Prior to Admission medications   Medication Sig Start Date End Date Taking? Authorizing Provider  aspirin 325 MG tablet Take 325 mg by mouth daily.    [provider]  atorvastatin (LIPITOR) 10 MG tablet Take 10 mg by mouth every evening.    [provider]  calcium acetate (PHOSLO) 667 MG capsule Take 1,334 mg by mouth 3 (three) times daily with meals. 06/02/10   [provider]  cinacalcet  (SENSIPAR) 30 MG tablet Take 30 mg by mouth daily.    [provider]  collagenase (SANTYL) ointment Apply 1 application topically daily. 06/23/20   McDonald, Stephan Minister, DPM  Continuous Blood Gluc Sensor (FREESTYLE LIBRE 14 DAY SENSOR) MISC See admin instructions. 04/08/20   [provider]  cycloSPORINE, PF, (CEQUA) 0.09 % SOLN Place 1 drop into both eyes daily.    [provider]  doxycycline (ADOXA) 100 MG tablet Take 1 tablet (100 mg total) by mouth 2 (two) times daily. 09/26/20 11/07/20  Debbe Odea, MD  gabapentin (NEURONTIN) 300 MG capsule Take 300 mg by mouth 3 (three) times daily. 05/09/15   [provider]  Insulin Disposable Pump (OMNIPOD DASH 5 PACK PODS) MISC Inject into the skin. Use with Humalog PUMP 08/28/19   [provider]  insulin lispro (HUMALOG) 100 UNIT/ML injection PUMP    [provider]  lidocaine (XYLOCAINE) 5 % ointment Apply 1 application topically 2 (two) times daily as needed (finger pain). 07/14/19   [provider]  metroNIDAZOLE (FLAGYL) 500 MG tablet Take 1 tablet (500 mg total) by mouth  3 (three) times daily for 14 days. 09/26/20 10/10/20  Debbe Odea, MD  midodrine (PROAMATINE) 5 MG tablet Take 5 mg by mouth every Tuesday, Thursday, and Saturday at 6 PM.    [provider]  Multiple Vitamin (MULTIVITAMIN WITH MINERALS) TABS tablet Take 1 tablet by mouth daily in the afternoon.    [provider]  mupirocin ointment (BACTROBAN) 2 % Apply 1 application topically daily as needed (itching). 07/16/20   [provider]  ondansetron (ZOFRAN) 4 MG tablet Take 4 mg by mouth 3 (three) times daily as needed for nausea or vomiting. 06/27/19   [provider]  oxyCODONE-acetaminophen (PERCOCET) 10-325 MG tablet Take 1 tablet by mouth 5 (five) times daily. 09/12/20   [provider]  Polyethyl Glycol-Propyl Glycol (SYSTANE) 0.4-0.3 % SOLN Place 1 drop into both eyes daily as needed (dry  eyes).    [provider]  sucroferric oxyhydroxide (VELPHORO) 500 MG chewable tablet Chew 500 mg by mouth daily.    [provider]  traMADol (ULTRAM) 50 MG tablet Take 1-2 tablets (50-100 mg total) by mouth every 6 (six) hours as needed. 10/01/20   [name], MD  vitamin B-12 (CYANOCOBALAMIN) 500 MCG tablet Take 500 mcg by mouth daily.    [provider]    Physical Exam: Vitals:   10/07/20 1530 10/07/20 1635 10/07/20 1645 10/07/20 1730  BP: 105/65 118/83 111/73 109/66  Pulse: 97 98 94 93  Resp: 12 (!) '21 18 17  '$ Temp:      TempSrc:      SpO2: 96% 99% 96% 100%    Constitutional: NAD, calm, comfortable Vitals:   10/07/20 1530 10/07/20 1635 10/07/20 1645 10/07/20 1730  BP: 105/65 118/83 111/73 109/66  Pulse: 97 98 94 93  Resp: 12 (!) '21 18 17  '$ Temp:      TempSrc:      SpO2: 96% 99% 96% 100%   Eyes: PERRL, lids and conjunctivae normal ENMT: Mucous membranes are moist. Posterior pharynx clear of any exudate or lesions.Normal dentition.  Neck: normal, supple, no masses, no thyromegaly Respiratory: Diminished breathing sound on right lower field, no wheezing, no crackles.  Increasing respiratory effort. No accessory muscle use.  Cardiovascular: Regular rate and rhythm, no murmurs / rubs / gallops.  2+ extremity edema.  Decreased pedal pulses. No carotid bruits.  Abdomen: no tenderness, no masses palpated. No hepatosplenomegaly. Bowel sounds positive.  Musculoskeletal: no clubbing / cyanosis. No joint deformity upper and lower extremities. Good ROM, no contractures. Normal muscle tone.  Skin: Large ulcers with eschar on bilateral diffuse Neurologic: CN 2-12 grossly intact. Sensation intact, DTR normal. Strength 5/5 in all 4.  Psychiatric: Normal judgment and insight. Alert and oriented x 3. Normal mood.     Labs on Admission: I have personally reviewed following labs and imaging studies  CBC: Recent Labs  Lab 10/01/20 0740 10/07/20 1248  WBC   --  10.1  NEUTROABS  --  8.8*  HGB 12.2* 9.0*  HCT 36.0* 31.6*  MCV  --  100.0  PLT  --  [id number]   Basic Metabolic Panel: Recent Labs  Lab 10/01/20 0740 10/07/20 1248  NA 137 132*  K 3.9 5.3*  CL 99 94*  CO2  --  26  GLUCOSE 121* 366*  BUN 29* 40*  CREATININE 5.10* 6.98*  CALCIUM  --  8.3*   GFR: Estimated Creatinine Clearance: 13.5 mL/min (A) (by C-G formula based on SCr of 6.98 mg/dL (H)). Liver Function Tests: Recent  Labs  Lab 10/07/20 1248  AST 13*  ALT <5  ALKPHOS 427*  BILITOT 1.1  PROT 6.4*  ALBUMIN 1.8*   No results for input(s): LIPASE, AMYLASE in the last 168 hours. No results for input(s): AMMONIA in the last 168 hours. Coagulation Profile: Recent Labs  Lab 10/07/20 1248  INR 1.3*   Cardiac Enzymes: No results for input(s): CKTOTAL, CKMB, CKMBINDEX, TROPONINI in the last 168 hours. BNP (last 3 results) No results for input(s): PROBNP in the last 8760 hours. HbA1C: No results for input(s): HGBA1C in the last 72 hours. CBG: Recent Labs  Lab 10/01/20 0654 10/01/20 0755 10/01/20 0922 10/07/20 1208  GLUCAP 164* 107* 93 356*   Lipid Profile: No results for input(s): CHOL, HDL, LDLCALC, TRIG, CHOLHDL, LDLDIRECT in the last 72 hours. Thyroid Function Tests: No results for input(s): TSH, T4TOTAL, FREET4, T3FREE, THYROIDAB in the last 72 hours. Anemia Panel: No results for input(s): VITAMINB12, FOLATE, FERRITIN, TIBC, IRON, RETICCTPCT in the last 72 hours. Urine analysis: No results found for: COLORURINE, APPEARANCEUR, LABSPEC, [place], GLUCOSEU, HGBUR, BILIRUBINUR, KETONESUR, PROTEINUR, UROBILINOGEN, NITRITE, LEUKOCYTESUR  Radiological Exams on Admission: DG Foot Complete Right  Result Date: 10/07/2020 CLINICAL DATA:  Wound on heel for 6 months, gangrene EXAM: RIGHT FOOT COMPLETE - 3+ VIEW COMPARISON:  09/24/2020 FINDINGS: Frontal, oblique, and lateral views of the right foot are obtained. There is a large soft tissue defect overlying the plantar  aspect of the right calcaneus with underlying bony changes consistent with acute osteomyelitis. Interval development of extensive subcutaneous gas throughout the plantar soft tissues which could reflect interval debridement or infection with gas-forming organism. Progressive soft tissue swelling of the remainder of the right foot. There is extensive periarticular osteopenia and diffuse osteoarthritis. Extensive vascular calcifications are noted. IMPRESSION: 1. Continued plantar soft tissue defect with underlying calcaneal osteomyelitis. Interval development of extensive subcutaneous gas could reflect interval debridement or infection with gas-forming organism. 2. Progressive soft tissue swelling of the right forefoot and midfoot. 3. Diffuse osteoarthritis and osteopenia. Electronically Signed   By: Randa Ngo M.D.   On: 10/07/2020 15:43    EKG: Ordered.  Assessment/Plan Active Problems:   Osteomyelitis ([place])  (please populate well all problems here in Problem List. (For example, if patient is on BP meds at home and you resume or decide to hold them, it is a problem that needs to be her. Same for CAD, COPD, HLD and so on)  Lateral heel osteomyelitis, acute on chronic, failed outpatient antibiotic treatment -Evaluated by podiatry today, recommend amputation for infectious source control.  Patient however very reluctant to lose left leg.  Defer further discussion to orthopedic surgery. -Continue vancomycin and Zosyn.  Acute on chronic systolic CHF decompensation -Signs of fluid overload and chest x-ray showed worsening of right-sided pleural effusion. -HD tonight. -Appears that patient also has concurrent hypoalbuminemia, will order 3 days of albumin infusion to boost fluid removal by HD. Trial of Lasix in between.  Hyperkalemia -Mild, HD tonight.  Nephrology consulted.  Chronic sacral wound -Consult wound care team.  IDDM -Insulin Pump  Chronic hypoalbuminemia -As above.  Chronic  groin and leg lymphadenopathy -Lymph node biopsy 2 days ago, pathology showed noncaseating granuloma  DVT prophylaxis: Heparin subcu Code Status: Full code Family Communication: None at bedside Disposition Plan: Expect more than 2 midnight hospital stay for acute on chronic osteomyelitis treatment and amputation and antibiotics. Consults called: Podiatry, nephrology. Admission status: Telemetry admission.   Lequita Halt MD [facility] Pager [phone]  10/07/2020,  5:54 PM

## 2020-10-07 NOTE — ED Provider Notes (Signed)
[place] EMERGENCY DEPARTMENT Provider Note   CSN: [id number] Arrival date & time: 10/07/20  1156     History No chief complaint on file.   [name] [name] is a 55 y.o. male.  Patient complains of discharge from left foot.  He has a history of infected foot.  Patient has diabetes and is a dialysis patient  The history is provided by the patient and medical records. No language interpreter was used.  Foot Pain This is a recurrent problem. The current episode started more than 1 week ago. The problem occurs constantly. The problem has not changed since onset.Pertinent negatives include no chest pain, no abdominal pain and no headaches. Nothing aggravates the symptoms. He has tried nothing for the symptoms. The treatment provided no relief.      Past Medical History:  Diagnosis Date   Anal infection    posterior anal canal   Anemia, chronic renal failure    Atrophic kidney    BILATERAL   DM type 2 causing ESRD ([place])    Nephrologist-- dr [name] [facility])--  on hemodialysis since June 2012 at  [name]  TTS   Hemodialysis patient [facility])    at [place] on Tues/ Thur/Sat/schedule   Hemorrhoids    Hepatitis B antibody positive    History of pleural effusion    bilateral   Hyperparathyroidism, secondary renal ([place])    Hypertension    Ischemic cardiomyopathy    per echo 07-01-2014  ef 45%   LAFB (left anterior fascicular block)    Peripheral neuropathy    Systolic and diastolic CHF, chronic ([place])    CARDIOLOGIST-  DR Daneen Schick ([place])  AND DR [name] ([name])    Patient Active Problem List   Diagnosis Date Noted   Osteomyelitis of foot, left, acute ([place]) 09/26/2020   Osteomyelitis of foot, right, acute ([place]) 09/26/2020   Stage III pressure ulcer of sacral region ([place]) [id number]   Acute systolic CHF (congestive heart failure) ([place]) 09/26/2020   Acute on chronic systolic congestive heart  failure ([name])    Pressure injury of skin 09/24/2020   Volume overload 09/23/2020   Osteomyelitis due to type 2 diabetes mellitus ([place]) 05/12/2020   Diabetic foot ulcer ([place]) 05/12/2020   HLD (hyperlipidemia) 02/08/2020   Secondary hyperparathyroidism ([place]) 02/08/2020   Secondary anemia 02/08/2020   Retinal detachment 02/08/2020   Medication monitoring encounter 07/25/2019   Penile mass 07/16/2019   BMI 40.0-44.9, adult ([place]) 12/20/2018   Wound infection 09/15/2018   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)    Acute GI bleeding 10/13/2017   ESRD on hemodialysis ([place]) 10/13/2017   Embolism, arterial ([place]) 10/12/2017   Ischemia of finger 2017-10-04   Deceased-donor kidney transplant 06/04/2016   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[place]) 06/04/2016   Diabetes mellitus type 2, uncontrolled ([place]) 05/03/2014   Non-ischemic cardiomyopathy ([place]) 05/03/2014   Type II diabetes mellitus with renal manifestations ([place]) 02/01/2011   Mechanical complication of other vascular device, implant, and graft 02/01/2011   HYPERCHOLESTEROLEMIA 08/19/2009   Essential hypertension [id number]   Chronic systolic heart failure ([place]) 08/19/2009   CKD (chronic kidney disease) stage V requiring chronic dialysis ([place]) 08/19/2009   Depression 08/19/2009    Past Surgical History:  Procedure Laterality Date   APPENDECTOMY  09-12-2004   laparotomy w/ drainage peritinitis   AV FISTULA PLACEMENT  02-27-2010   right forearm (RADIOCEPHALIC)   AV FISTULA REPAIR  10-30-2010   BONE BIOPSY Left 05/14/2020   Procedure: BONE BIOPSY;  Surgeon: Trula Slade, DPM;  Location: [facility];  Service: Podiatry;  Laterality: Left;   CARDIOVASCULAR STRESS TEST  10-29-2011   dr Daneen Schick   Low risk scan;  mild perfusion defect seen in the basal inferoseptal, basal inferior and mid inferior regions consistent with an infarct/scar and/or overlying attenuation/  mild to moderate global LVSF,  ef 40-45%    DOBUTAMINE STRESS ECHO  07-23-2012   [name]   abnormal ;  at rest estimated lvef 25-30% and global severe LV hypokinesis ;  no cp during stress and achieved 85% maxium predicted heart rate;  negative stress ECG for inducible ischemia;  estimated lvef with stress 35-40%;  augmentation of wall segments consistant with cardiomyopathy and differential fibrosis   FISTULOTOMY N/A 03/26/2015   Procedure: FISTULOTOMY;  Surgeon: [name], MD;  Location[facility];  Service: General;  Laterality: N/A;   GRAFT APPLICATION Left 0000000   Procedure: GRAFT APPLICATION;  Surgeon: Trula Slade, DPM;  Location: [place];  Service: Podiatry;  Laterality: Left;   GRAFT APPLICATION Bilateral 0000000   Procedure: GRAFT APPLICATION;  Surgeon: Trula Slade, DPM;  Location: [facility];  Service: Podiatry;  Laterality: Bilateral;   INCISION AND DRAINAGE ABSCESS N/A 03/26/2015   Procedure: ANAL INCISION AND DRAINAGE;  Surgeon: [name], MD;  Location: [place];  Service: General;  Laterality: N/A;   INGUINAL LYMPH NODE BIOPSY Right 10/01/2020   Procedure: EXCISIONAL BIOPSY OF RIGHT INGUINAL LYMPH NODE;  Surgeon: [name], MD;  Location: [facility];  Service: General;  Laterality: Right;   RETINAL DETACHMENT SURGERY Left 2011   incomplete repair/ needs eye drops to keep pressure down   TEE WITHOUT CARDIOVERSION N/A 10/17/2017   Procedure: TRANSESOPHAGEAL ECHOCARDIOGRAM (TEE);  Surgeon: Josue Hector, MD;  Location[facility];  Service: Cardiovascular;  Laterality: N/A;   TRANSTHORACIC ECHOCARDIOGRAM  07-01-2014    done at [facility]   grade 1 diastolic dysfunction,  ef 45%/  trace TR and PR   WOUND DEBRIDEMENT Left 05/14/2020   Procedure: DEBRIDEMENT WOUND;  Surgeon: Trula Slade, DPM;  Location: [facility];  Service: Podiatry;  Laterality: Left;   WOUND DEBRIDEMENT Bilateral 06/04/2020   Procedure: DEBRIDEMENT WOUND BOTH FEET;  Surgeon: Trula Slade, DPM;  Location: [facility];  Service: Podiatry;  Laterality: Bilateral;       Family History  Problem Relation Age of Onset   Diabetes Other     Social History   Tobacco Use   Smoking status: Never   Smokeless tobacco: Never  Vaping Use   Vaping Use: Never used  Substance Use Topics   Alcohol use: No   Drug use: No    Home Medications Prior to Admission medications   Medication Sig Start Date End Date Taking? Authorizing Provider  aspirin 325 MG tablet Take 325 mg by mouth daily.    [provider]  atorvastatin (LIPITOR) 10 MG tablet Take 10 mg by mouth every evening.    [provider]  calcium acetate (PHOSLO) 667 MG capsule Take 1,334 mg by mouth 3 (three) times daily with meals. 06/02/10   [provider]  cinacalcet (SENSIPAR) 30 MG tablet Take 30 mg by mouth daily.    [provider]  collagenase (SANTYL) ointment Apply 1 application topically daily. 06/23/20   McDonald, Stephan Minister, DPM  Continuous Blood Gluc Sensor (FREESTYLE LIBRE 14 DAY  SENSOR) MISC See admin instructions. 04/08/20   [provider]  cycloSPORINE, PF, (CEQUA) 0.09 % SOLN Place 1 drop into both eyes daily.    [provider]  doxycycline (ADOXA) 100 MG tablet Take 1 tablet (100 mg total) by mouth 2 (two) times daily. 09/26/20 11/07/20  Debbe Odea, MD  gabapentin (NEURONTIN) 300 MG capsule Take 300 mg by mouth 3 (three) times daily. 05/09/15   [provider]  Insulin Disposable Pump (OMNIPOD DASH 5 PACK PODS) MISC Inject into the skin. Use with Humalog PUMP 08/28/19   [provider]  insulin lispro (HUMALOG) 100 UNIT/ML injection PUMP    [provider]  lidocaine (XYLOCAINE) 5 % ointment Apply 1 application topically 2 (two) times daily as needed (finger pain). 07/14/19   [provider]  metroNIDAZOLE (FLAGYL) 500 MG tablet Take 1 tablet (500 mg total) by mouth 3 (three) times daily for 14 days. 09/26/20 10/10/20   Debbe Odea, MD  midodrine (PROAMATINE) 5 MG tablet Take 5 mg by mouth every Tuesday, Thursday, and Saturday at 6 PM.    [provider]  Multiple Vitamin (MULTIVITAMIN WITH MINERALS) TABS tablet Take 1 tablet by mouth daily in the afternoon.    [provider]  mupirocin ointment (BACTROBAN) 2 % Apply 1 application topically daily as needed (itching). 07/16/20   [provider]  ondansetron (ZOFRAN) 4 MG tablet Take 4 mg by mouth 3 (three) times daily as needed for nausea or vomiting. 06/27/19   [provider]  oxyCODONE-acetaminophen (PERCOCET) 10-325 MG tablet Take 1 tablet by mouth 5 (five) times daily. 09/12/20   [provider]  Polyethyl Glycol-Propyl Glycol (SYSTANE) 0.4-0.3 % SOLN Place 1 drop into both eyes daily as needed (dry eyes).    [provider]  sucroferric oxyhydroxide (VELPHORO) 500 MG chewable tablet Chew 500 mg by mouth daily.    [provider]  traMADol (ULTRAM) 50 MG tablet Take 1-2 tablets (50-100 mg total) by mouth every 6 (six) hours as needed. 10/01/20   [name], MD  vitamin B-12 (CYANOCOBALAMIN) 500 MCG tablet Take 500 mcg by mouth daily.    [provider]    Allergies    Patient has no known allergies.  Review of Systems   Review of Systems  Constitutional:  Negative for appetite change and fatigue.  HENT:  Negative for congestion, ear discharge and sinus pressure.   Eyes:  Negative for discharge.  Respiratory:  Negative for cough.   Cardiovascular:  Negative for chest pain.  Gastrointestinal:  Negative for abdominal pain and diarrhea.  Genitourinary:  Negative for frequency and hematuria.  Musculoskeletal:  Negative for back pain.       Left heel painful and draining  Skin:  Negative for rash.  Neurological:  Negative for seizures and headaches.  Psychiatric/Behavioral:  Negative for hallucinations.    Physical Exam Updated Vital Signs BP 105/65   Pulse 97   Temp 98.2 F  (36.8 C) (Oral)   Resp 12   SpO2 96%   Physical Exam Vitals and nursing note reviewed.  Constitutional:      Appearance: He is well-developed.  HENT:     Head: Normocephalic.     Nose: Nose normal.  Eyes:     General: No scleral icterus.    Conjunctiva/sclera: Conjunctivae normal.  Neck:     Thyroid: No thyromegaly.  Cardiovascular:     Rate and Rhythm: Normal rate and regular rhythm.     Heart sounds:  No murmur heard.   No friction rub. No gallop.  Pulmonary:     Breath sounds: No stridor. No wheezing or rales.  Chest:     Chest wall: No tenderness.  Abdominal:     General: There is no distension.     Tenderness: There is no abdominal tenderness. There is no rebound.  Musculoskeletal:        General: Normal range of motion.     Cervical back: Neck supple.     Comments: Gangrenous looking left heel  Lymphadenopathy:     Cervical: No cervical adenopathy.  Skin:    Findings: No erythema or rash.  Neurological:     Mental Status: He is oriented to person, place, and time.     Motor: No abnormal muscle tone.     Coordination: Coordination normal.  Psychiatric:        Behavior: Behavior normal.    ED Results / Procedures / Treatments   Labs (all labs ordered are listed, but only abnormal results are displayed) Labs Reviewed  COMPREHENSIVE METABOLIC PANEL - Abnormal; Notable for the following components:      Result Value   Sodium 132 (*)    Potassium 5.3 (*)    Chloride 94 (*)    Glucose, Bld 366 (*)    BUN 40 (*)    Creatinine, Ser 6.98 (*)    Calcium 8.3 (*)    Total Protein 6.4 (*)    Albumin 1.8 (*)    AST 13 (*)    Alkaline Phosphatase 427 (*)    GFR, Estimated 9 (*)    All other components within normal limits  LACTIC ACID, PLASMA - Abnormal; Notable for the following components:   Lactic Acid, Venous 2.2 (*)    All other components within normal limits  CBC WITH DIFFERENTIAL/PLATELET - Abnormal; Notable for the following components:   RBC 3.16 (*)     Hemoglobin 9.0 (*)    HCT 31.6 (*)    MCHC 28.5 (*)    RDW 16.0 (*)    Neutro Abs 8.8 (*)    Lymphs Abs 0.6 (*)    All other components within normal limits  PROTIME-INR - Abnormal; Notable for the following components:   Prothrombin Time 15.9 (*)    INR 1.3 (*)    All other components within normal limits  CBG MONITORING, ED - Abnormal; Notable for the following components:   Glucose-Capillary 356 (*)    All other components within normal limits  CULTURE, BLOOD (ROUTINE X 2)  CULTURE, BLOOD (ROUTINE X 2)  CULTURE, BLOOD (SINGLE)  LACTIC ACID, PLASMA  URINALYSIS, ROUTINE W REFLEX MICROSCOPIC    EKG None  Radiology DG Foot Complete Right  Result Date: 10/07/2020 CLINICAL DATA:  Wound on heel for 6 months, gangrene EXAM: RIGHT FOOT COMPLETE - 3+ VIEW COMPARISON:  09/24/2020 FINDINGS: Frontal, oblique, and lateral views of the right foot are obtained. There is a large soft tissue defect overlying the plantar aspect of the right calcaneus with underlying bony changes consistent with acute osteomyelitis. Interval development of extensive subcutaneous gas throughout the plantar soft tissues which could reflect interval debridement or infection with gas-forming organism. Progressive soft tissue swelling of the remainder of the right foot. There is extensive periarticular osteopenia and diffuse osteoarthritis. Extensive vascular calcifications are noted. IMPRESSION: 1. Continued plantar soft tissue defect with underlying calcaneal osteomyelitis. Interval development of extensive subcutaneous gas could reflect interval debridement or infection with gas-forming organism. 2. Progressive soft tissue swelling  of the right forefoot and midfoot. 3. Diffuse osteoarthritis and osteopenia. Electronically Signed   By: Randa Ngo M.D.   On: 10/07/2020 15:43    Procedures Procedures   Medications Ordered in ED Medications  piperacillin-tazobactam (ZOSYN) IVPB 3.375 g (has no administration in  time range)  vancomycin (VANCOCIN) 2,250 mg in sodium chloride 0.9 % 500 mL IVPB (has no administration in time range)    ED Course  I have reviewed the triage vital signs and the nursing notes.  Pertinent labs & imaging results that were available during my care of the patient were reviewed by me and considered in my medical decision making (see chart for details). CRITICAL CARE Performed by: Milton Ferguson Total critical care time: 35 minutes Critical care time was exclusive of separately billable procedures and treating other patients. Critical care was necessary to treat or prevent imminent or life-threatening deterioration. Critical care was time spent personally by me on the following activities: development of treatment plan with patient and/or surrogate as well as nursing, discussions with consultants, evaluation of patient's response to treatment, examination of patient, obtaining history from patient or surrogate, ordering and performing treatments and interventions, ordering and review of laboratory studies, ordering and review of radiographic studies, pulse oximetry and re-evaluation of patient's condition.    MDM Rules/Calculators/A&P                           Patient with a gangrenous left foot.  He will be admitted to medicine.  Orthopedics has been consulted and Dr. Sharol Given will see the patient tomorrow.  Also nephrology has been consulted Final Clinical Impression(s) / ED Diagnoses Final diagnoses:  None    Rx / DC Orders ED Discharge Orders     None        Milton Ferguson, MD 10/09/20 1039

## 2020-10-07 NOTE — Consult Note (Addendum)
[facility] Renal Consultation Note    Indication for Consultation:  Management of ESRD/hemodialysis, anemia, hypertension/volume, and secondary hyperparathyroidism. PCP:  HPI: [name] [name] is a 55 y.o. male with ESRD on HD since 2012 (failed Kid tx 2018 - thrombosed renal vein post-op, s/p Tx nephrectomy), HTN, T2DM, HFrEF (40-45%), Hx several finger tip amputations for gangrene who is being admitted with worsened B foot/heel wounds.  Was hospitalized with same concern, as well as dyspnea 7/5 - 09/26/2020. Dialyzed serially for anasarca. Ortho consulted for heel wounds and recommended B BKAs which he refused. Did agree for IV antibiotics which he has been getting at his HD unit ([facility]). Now, pain is worse so he is back and agreeable for R BKA only -- does not want both done at the same time.  Denies fever, chills, CP, dyspnea, N/V/D. Labs today: Na 132, K 5.3, LA 2.2, WBC 10.1, Hgb 9.  Dialyzes on TTS schedule at [facility]. Full HD on Sat 7/16, per patient report. Due for HD today.  Past Medical History:  Diagnosis Date   Anal infection    posterior anal canal   Anemia, chronic renal failure    Atrophic kidney    BILATERAL   DM type 2 causing ESRD ([place])    Nephrologist-- dr [name])--  on hemodialysis since June 2012 at  [name]  TTS   Hemodialysis patient [facility])    at [place] on Tues/ Thur/Sat/schedule   Hemorrhoids    Hepatitis B antibody positive    History of pleural effusion    bilateral   Hyperparathyroidism, secondary renal ([place])    Hypertension    Ischemic cardiomyopathy    per echo 07-01-2014  ef 45%   LAFB (left anterior fascicular block)    Peripheral neuropathy    Systolic and diastolic CHF, chronic ([place])    CARDIOLOGIST-  DR Daneen Schick ([place])  AND DR [name] ([name])   Past Surgical History:  Procedure Laterality Date   APPENDECTOMY  09-12-2004   laparotomy w/ drainage  peritinitis   AV FISTULA PLACEMENT  02-27-2010   right forearm (RADIOCEPHALIC)   AV FISTULA REPAIR  10-30-2010   BONE BIOPSY Left 05/14/2020   Procedure: BONE BIOPSY;  Surgeon: Trula Slade, DPM;  Location: [place];  Service: Podiatry;  Laterality: Left;   CARDIOVASCULAR STRESS TEST  10-29-2011   dr Daneen Schick   Low risk scan;  mild perfusion defect seen in the basal inferoseptal, basal inferior and mid inferior regions consistent with an infarct/scar and/or overlying attenuation/  mild to moderate global LVSF,  ef 40-45%   DOBUTAMINE STRESS ECHO  07-23-2012   [name]   abnormal ;  at rest estimated lvef 25-30% and global severe LV hypokinesis ;  no cp during stress and achieved 85% maxium predicted heart rate;  negative stress ECG for inducible ischemia;  estimated lvef with stress 35-40%;  augmentation of wall segments consistant with cardiomyopathy and differential fibrosis   FISTULOTOMY N/A 03/26/2015   Procedure: FISTULOTOMY;  Surgeon: [name], MD;  Location: [facility];  Service: General;  Laterality: N/A;   GRAFT APPLICATION Left 0000000   Procedure: GRAFT APPLICATION;  Surgeon: Trula Slade, DPM;  Location: [place];  Service: Podiatry;  Laterality: Left;   GRAFT APPLICATION Bilateral 0000000   Procedure: GRAFT APPLICATION;  Surgeon: Trula Slade, DPM;  Location: [facility];  Service: Podiatry;  Laterality: Bilateral;   INCISION AND DRAINAGE ABSCESS  N/A 03/26/2015   Procedure: ANAL INCISION AND DRAINAGE;  Surgeon: [name], MD;  Location: [facility];  Service: General;  Laterality: N/A;   INGUINAL LYMPH NODE BIOPSY Right 10/01/2020   Procedure: EXCISIONAL BIOPSY OF RIGHT INGUINAL LYMPH NODE;  Surgeon: [name], MD;  Location: [place];  Service: General;  Laterality: Right;   RETINAL DETACHMENT SURGERY Left 2011   incomplete repair/ needs eye drops to keep pressure down   TEE WITHOUT CARDIOVERSION N/A 10/17/2017   Procedure:  TRANSESOPHAGEAL ECHOCARDIOGRAM (TEE);  Surgeon: Josue Hector, MD;  Location: [facility];  Service: Cardiovascular;  Laterality: N/A;   TRANSTHORACIC ECHOCARDIOGRAM  07-01-2014    done at [facility]   grade 1 diastolic dysfunction,  ef 45%/  trace TR and PR   WOUND DEBRIDEMENT Left 05/14/2020   Procedure: DEBRIDEMENT WOUND;  Surgeon: Trula Slade, DPM;  Location: [place];  Service: Podiatry;  Laterality: Left;   WOUND DEBRIDEMENT Bilateral 06/04/2020   Procedure: DEBRIDEMENT WOUND BOTH FEET;  Surgeon: Trula Slade, DPM;  Location: [facility];  Service: Podiatry;  Laterality: Bilateral;   Family History  Problem Relation Age of Onset   Diabetes Other    Social History:  reports that he has never smoked. He has never used smokeless tobacco. He reports that he does not drink alcohol and does not use drugs.  ROS: As per HPI otherwise negative.  Physical Exam: Vitals:   10/07/20 1230 10/07/20 1345 10/07/20 1430 10/07/20 1530  BP: 105/66 112/79 102/71 105/65  Pulse: 100 99 97 97  Resp: '16 17 11 12  '$ Temp:      TempSrc:      SpO2: 97% 97% 97% 96%     General: Well developed, well nourished, in no acute distress. Head: Normocephalic, atraumatic, sclera non-icteric, mucus membranes are moist. Neck: Supple without lymphadenopathy/masses. JVD not elevated. Lungs: Bibasilar rales; no wheezing. Heart: RRR with normal S1, S2. No murmurs, rubs, or gallops appreciated. Abdomen: Pitting edema across abdomen/flanks. Musculoskeletal:  Strength and tone appear normal for age. Lower extremities: 4+ pitting BLE edema; B heel wounds to bone - extensive purulent drainage from L heel in particular. Neuro: Alert and oriented X 3. Moves all extremities spontaneously. Psych:  Responds to questions appropriately with a normal affect. Dialysis Access: R forerarm AVF + thrill  No Known Allergies Prior to Admission medications   Medication Sig Start Date End Date Taking? Authorizing  Provider  aspirin 325 MG tablet Take 325 mg by mouth daily.    [provider]  atorvastatin (LIPITOR) 10 MG tablet Take 10 mg by mouth every evening.    [provider]  calcium acetate (PHOSLO) 667 MG capsule Take 1,334 mg by mouth 3 (three) times daily with meals. 06/02/10   [provider]  cinacalcet (SENSIPAR) 30 MG tablet Take 30 mg by mouth daily.    [provider]  collagenase (SANTYL) ointment Apply 1 application topically daily. 06/23/20   McDonald, Stephan Minister, DPM  Continuous Blood Gluc Sensor (FREESTYLE LIBRE 14 DAY SENSOR) MISC See admin instructions. 04/08/20   [provider]  cycloSPORINE, PF, (CEQUA) 0.09 % SOLN Place 1 drop into both eyes daily.    [provider]  doxycycline (ADOXA) 100 MG tablet Take 1 tablet (100 mg total) by mouth 2 (two) times daily. 09/26/20 11/07/20  Debbe Odea, MD  gabapentin (NEURONTIN) 300 MG capsule Take 300 mg by mouth 3 (three) times daily. 05/09/15   [provider]  Insulin Disposable Pump (OMNIPOD DASH 5 PACK PODS) MISC Inject into the skin. Use with Humalog PUMP 08/28/19   [provider]  insulin lispro (HUMALOG) 100 UNIT/ML injection PUMP    [provider]  lidocaine (XYLOCAINE) 5 % ointment Apply 1 application topically 2 (two) times daily as needed (finger pain). 07/14/19   [provider]  metroNIDAZOLE (FLAGYL) 500 MG tablet Take 1 tablet (500 mg total) by mouth 3 (three) times daily for 14 days. 09/26/20 10/10/20  Debbe Odea, MD  midodrine (PROAMATINE) 5 MG tablet Take 5 mg by mouth every Tuesday, Thursday, and Saturday at 6 PM.    [provider]  Multiple Vitamin (MULTIVITAMIN WITH MINERALS) TABS tablet Take 1 tablet by mouth daily in the afternoon.    [provider]  mupirocin ointment (BACTROBAN) 2 % Apply 1 application topically daily as needed (itching). 07/16/20   [provider]  ondansetron (ZOFRAN) 4 MG tablet Take 4 mg by  mouth 3 (three) times daily as needed for nausea or vomiting. 06/27/19   [provider]  oxyCODONE-acetaminophen (PERCOCET) 10-325 MG tablet Take 1 tablet by mouth 5 (five) times daily. 09/12/20   [provider]  Polyethyl Glycol-Propyl Glycol (SYSTANE) 0.4-0.3 % SOLN Place 1 drop into both eyes daily as needed (dry eyes).    [provider]  sucroferric oxyhydroxide (VELPHORO) 500 MG chewable tablet Chew 500 mg by mouth daily.    [provider]  traMADol (ULTRAM) 50 MG tablet Take 1-2 tablets (50-100 mg total) by mouth every 6 (six) hours as needed. 10/01/20   [name], MD  vitamin B-12 (CYANOCOBALAMIN) 500 MCG tablet Take 500 mcg by mouth daily.    [provider]   Current Facility-Administered Medications  Medication Dose Route Frequency Provider Last Rate Last Admin   piperacillin-tazobactam (ZOSYN) IVPB 3.375 g  3.375 g Intravenous Once Milton Ferguson, MD       vancomycin (VANCOCIN) 2,250 mg in sodium chloride 0.9 % 500 mL IVPB  2,250 mg Intravenous Once Milton Ferguson, MD       Current Outpatient Medications  Medication Sig Dispense Refill   aspirin 325 MG tablet Take 325 mg by mouth daily.     atorvastatin (LIPITOR) 10 MG tablet Take 10 mg by mouth every evening.     calcium acetate (PHOSLO) 667 MG capsule Take 1,334 mg by mouth 3 (three) times daily with meals.     cinacalcet (SENSIPAR) 30 MG tablet Take 30 mg by mouth daily.     collagenase (SANTYL) ointment Apply 1 application topically daily. 90 g 1   Continuous Blood Gluc Sensor (FREESTYLE LIBRE 14 DAY SENSOR) MISC See admin instructions.     cycloSPORINE, PF, (CEQUA) 0.09 % SOLN Place 1 drop into both eyes daily.     doxycycline (ADOXA) 100 MG tablet Take 1 tablet (100 mg total) by mouth 2 (two) times daily. 84 tablet 0   gabapentin (NEURONTIN) 300 MG capsule Take 300 mg by mouth 3 (three) times daily.  3   Insulin Disposable Pump (OMNIPOD DASH 5 PACK PODS) MISC Inject into the  skin. Use with Humalog PUMP     insulin lispro (HUMALOG) 100 UNIT/ML injection PUMP     lidocaine (XYLOCAINE) 5 % ointment Apply 1 application topically 2 (two) times daily as needed (finger pain).     metroNIDAZOLE (FLAGYL) 500 MG tablet Take 1 tablet (500 mg total) by mouth 3 (three) times daily for 14 days. 42 tablet 0   midodrine (  PROAMATINE) 5 MG tablet Take 5 mg by mouth every Tuesday, Thursday, and Saturday at 6 PM.     Multiple Vitamin (MULTIVITAMIN WITH MINERALS) TABS tablet Take 1 tablet by mouth daily in the afternoon.     mupirocin ointment (BACTROBAN) 2 % Apply 1 application topically daily as needed (itching).     ondansetron (ZOFRAN) 4 MG tablet Take 4 mg by mouth 3 (three) times daily as needed for nausea or vomiting.     oxyCODONE-acetaminophen (PERCOCET) 10-325 MG tablet Take 1 tablet by mouth 5 (five) times daily.     Polyethyl Glycol-Propyl Glycol (SYSTANE) 0.4-0.3 % SOLN Place 1 drop into both eyes daily as needed (dry eyes).     sucroferric oxyhydroxide (VELPHORO) 500 MG chewable tablet Chew 500 mg by mouth daily.     traMADol (ULTRAM) 50 MG tablet Take 1-2 tablets (50-100 mg total) by mouth every 6 (six) hours as needed. 20 tablet 0   vitamin B-12 (CYANOCOBALAMIN) 500 MCG tablet Take 500 mcg by mouth daily.     Labs: Basic Metabolic Panel: Recent Labs  Lab 10/01/20 0740 10/07/20 1248  NA 137 132*  K 3.9 5.3*  CL 99 94*  CO2  --  26  GLUCOSE 121* 366*  BUN 29* 40*  CREATININE 5.10* 6.98*  CALCIUM  --  8.3*   Liver Function Tests: Recent Labs  Lab 10/07/20 1248  AST 13*  ALT <5  ALKPHOS 427*  BILITOT 1.1  PROT 6.4*  ALBUMIN 1.8*   CBC: Recent Labs  Lab 10/01/20 0740 10/07/20 1248  WBC  --  10.1  NEUTROABS  --  8.8*  HGB 12.2* 9.0*  HCT 36.0* 31.6*  MCV  --  100.0  PLT  --  357   Studies/Results: DG Foot Complete Right  Result Date: 10/07/2020 CLINICAL DATA:  Wound on heel for 6 months, gangrene EXAM: RIGHT FOOT COMPLETE - 3+ VIEW  COMPARISON:  09/24/2020 FINDINGS: Frontal, oblique, and lateral views of the right foot are obtained. There is a large soft tissue defect overlying the plantar aspect of the right calcaneus with underlying bony changes consistent with acute osteomyelitis. Interval development of extensive subcutaneous gas throughout the plantar soft tissues which could reflect interval debridement or infection with gas-forming organism. Progressive soft tissue swelling of the remainder of the right foot. There is extensive periarticular osteopenia and diffuse osteoarthritis. Extensive vascular calcifications are noted. IMPRESSION: 1. Continued plantar soft tissue defect with underlying calcaneal osteomyelitis. Interval development of extensive subcutaneous gas could reflect interval debridement or infection with gas-forming organism. 2. Progressive soft tissue swelling of the right forefoot and midfoot. 3. Diffuse osteoarthritis and osteopenia. Electronically Signed   By: Randa Ngo M.D.   On: 10/07/2020 15:43    Dialysis Orders: TTS at [facility] --> need to clarify 3:45hr, 250dialyzer, 450/A1.5, EDW ~ 90kg, 2K/2.5Ca, AVF, heparin 6000 + 500/hr - Aranesp 122mg IV q Thur - Zemplar 919m IV q HD   Assessment/Plan: B heel wounds/osteomyelitis: Prev recommendation was B BKA and had refused. Now pain is worse and agreeable to R BKA only (despite L heel wound with purulent drainage and just as bad a wound). Needs ortho and likely vascular surgery on board, await surgical plan. Dyspnea/Vol overload/Anasarca: S/p serial HD during last admit - continue process of volume correction while here.  ESRD:  Usual TTS sched - due for HD today. Will order, but will likely be tomorrow morning d/t high HD census. Lokelma 10g x1 now to hold him  over with K 5.3.  Hypertension/volume: BP stable with significant anasarca. No antihypertensive meds - on mido '5mg'$  pre-HD only which can be escalated up if we run into issues with BP  dropping on HD.  Anemia: Hgb 9.0 - will continue weekly Aranesp.  Metabolic bone disease: Ca ok, Phos pending. Resume binder binders + VDRA.  Nutrition: Alb very low - adding supplements.  Sacral wound: Noted during last admit. Per primary.  Hx finger amputations, ischemic  Veneta Penton, PA-C 10/07/2020, 4:16 PM  [facility]  I have seen and examined this patient and agree with plan and assessment in the above note with renal recommendations/intervention highlighted. Pt with severe bilateral heel wound/osteomyelitis.  He is only amenable to RBKA at this time, however he will definitely benefit from bilateral BKA's.  Plan for HD tomorrow due to high HD census.  Lokelma to temporize hyperkalemia.  Governor [place]ooks Hillery Zachman,MD 10/07/2020 4:33 PM

## 2020-10-07 NOTE — Progress Notes (Signed)
Inpatient Diabetes Program Recommendations  AACE/ADA: New Consensus Statement on Inpatient Glycemic Control (2015)  Target Ranges:  Prepandial:   less than 140 mg/dL      Peak postprandial:   less than 180 mg/dL (1-2 hours)      Critically ill patients:  140 - 180 mg/dL   Lab Results  Component Value Date   GLUCAP 356 (H) 10/07/2020   HGBA1C 7.7 (H) 09/24/2020    Review of Glycemic Control Results for [name] [name], [name] [name] (MRN [id number]) as of 10/07/2020 15:21  Ref. Range 10/07/2020 12:08  Glucose-Capillary Latest Ref Range: 70 - 99 mg/dL 356 (H)  Results for [name], [name] (MRN [id number]) as of 10/07/2020 15:21  Ref. Range 10/07/2020 12:48  GFR, Estimated Latest Ref Range: >60 mL/min 9 (L)   Diabetes history: DM2 Outpatient Diabetes medications: Humalog via Omnipod Insulin Pump; Freestyle Libre Current orders for Inpatient glycemic control: None  Inpatient Diabetes Program Recommendations:    Please consider removing Omnipod as patient does not have PDA (remote) with him to control his insulin delivery.    Patient has ESRD/HD  Please consider:  Lantus 10 units QD Novolog 0-6 units Q4H  Spoke with patient at bedside.  He has his Omnipod in place and his [employer].  He does not have his PDA or reader.  Current CBG is 356 mg/dL.  He does not know his insulin pump settings.  His mother might be able to bring it in but is not sure it will be today.  Spoke with Dr. Roderic Palau and explained he does not have a way of controlling his insulin delivery at this time.    Will continue to follow while inpatient.  Thank you, Reche Dixon, RN, BSN Diabetes Coordinator Inpatient Diabetes Program [phone] (team pager from 8a-5p)

## 2020-10-07 NOTE — Consult Note (Addendum)
Reason for Consult: Osteomyelitis Referring Physician: Dr. Roderic Palau, MD  [name] [name] is an 55 y.o. male.  HPI: 55 year old male admitted to the hospital for worsening wounds, gangrene of his right foot.  He has bilateral heel ulcerations, osteomyelitis.  Also past medical history significant for chronic systemic congestive heart failure, end-stage renal disease on HD, orthostatic hypotension, IDDM, chronic sacral wounds, HDL, chronic lymphadenopathy and upper extremity arterial thrombosis.  Dr. Sharol Given has already been consulted.  The patient is agreeable to BKA on the right side.  Past Medical History:  Diagnosis Date   Anal infection    posterior anal canal   Anemia, chronic renal failure    Atrophic kidney    BILATERAL   DM type 2 causing ESRD ([place])    Nephrologist-- dr [name])--  on hemodialysis since June 2012 at  [name]  TTS   Hemodialysis patient [facility])    at [facility] on Tues/ Thur/Sat/schedule   Hemorrhoids    Hepatitis B antibody positive    History of pleural effusion    bilateral   Hyperparathyroidism, secondary renal ([place])    Hypertension    Ischemic cardiomyopathy    per echo 07-01-2014  ef 45%   LAFB (left anterior fascicular block)    Peripheral neuropathy    Systolic and diastolic CHF, chronic ([place])    CARDIOLOGIST-  DR Daneen Schick ([place])  AND DR [name] ([name])    Past Surgical History:  Procedure Laterality Date   APPENDECTOMY  09-12-2004   laparotomy w/ drainage peritinitis   AV FISTULA PLACEMENT  02-27-2010   right forearm (RADIOCEPHALIC)   AV FISTULA REPAIR  10-30-2010   BONE BIOPSY Left 05/14/2020   Procedure: BONE BIOPSY;  Surgeon: Trula Slade, DPM;  Location: [place];  Service: Podiatry;  Laterality: Left;   CARDIOVASCULAR STRESS TEST  10-29-2011   dr Daneen Schick   Low risk scan;  mild perfusion defect seen in the basal inferoseptal, basal inferior and mid inferior regions  consistent with an infarct/scar and/or overlying attenuation/  mild to moderate global LVSF,  ef 40-45%   DOBUTAMINE STRESS ECHO  07-23-2012   [name]   abnormal ;  at rest estimated lvef 25-30% and global severe LV hypokinesis ;  no cp during stress and achieved 85% maxium predicted heart rate;  negative stress ECG for inducible ischemia;  estimated lvef with stress 35-40%;  augmentation of wall segments consistant with cardiomyopathy and differential fibrosis   FISTULOTOMY N/A 03/26/2015   Procedure: FISTULOTOMY;  Surgeon: [name], MD;  Location: [facility];  Service: General;  Laterality: N/A;   GRAFT APPLICATION Left 0000000   Procedure: GRAFT APPLICATION;  Surgeon: Trula Slade, DPM;  Location: [place];  Service: Podiatry;  Laterality: Left;   GRAFT APPLICATION Bilateral 0000000   Procedure: GRAFT APPLICATION;  Surgeon: Trula Slade, DPM;  Location: [facility];  Service: Podiatry;  Laterality: Bilateral;   INCISION AND DRAINAGE ABSCESS N/A 03/26/2015   Procedure: ANAL INCISION AND DRAINAGE;  Surgeon: [name], MD;  Location: [facility];  Service: General;  Laterality: N/A;   INGUINAL LYMPH NODE BIOPSY Right 10/01/2020   Procedure: EXCISIONAL BIOPSY OF RIGHT INGUINAL LYMPH NODE;  Surgeon: [name], MD;  Location: [place];  Service: General;  Laterality: Right;   RETINAL DETACHMENT SURGERY Left 2011   incomplete repair/ needs eye drops to keep pressure down   TEE WITHOUT CARDIOVERSION N/A 10/17/2017  Procedure: TRANSESOPHAGEAL ECHOCARDIOGRAM (TEE);  Surgeon: Josue Hector, MD;  Location[facility];  Service: Cardiovascular;  Laterality: N/A;   TRANSTHORACIC ECHOCARDIOGRAM  07-01-2014    done at [facility]   grade 1 diastolic dysfunction,  ef 45%/  trace TR and PR   WOUND DEBRIDEMENT Left 05/14/2020   Procedure: DEBRIDEMENT WOUND;  Surgeon: Trula Slade, DPM;  Location: [place];  Service: Podiatry;  Laterality: Left;    WOUND DEBRIDEMENT Bilateral 06/04/2020   Procedure: DEBRIDEMENT WOUND BOTH FEET;  Surgeon: Trula Slade, DPM;  Location: [facility];  Service: Podiatry;  Laterality: Bilateral;    Family History  Problem Relation Age of Onset   Diabetes Other     Social History:  reports that he has never smoked. He has never used smokeless tobacco. He reports that he does not drink alcohol and does not use drugs.  Allergies: No Known Allergies  Medications: I have reviewed the patient's current medications.  Results for orders placed or performed during the hospital encounter of 10/07/20 (from the past 48 hour(s))  CBG monitoring, ED     Status: Abnormal   Collection Time: 10/07/20 12:08 PM  Result Value Ref Range   Glucose-Capillary 356 (H) 70 - 99 mg/dL    Comment: Glucose reference range applies only to samples taken after fasting for at least 8 hours.  Comprehensive metabolic panel     Status: Abnormal   Collection Time: 10/07/20 12:48 PM  Result Value Ref Range   Sodium 132 (L) 135 - 145 mmol/L   Potassium 5.3 (H) 3.5 - 5.1 mmol/L   Chloride 94 (L) 98 - 111 mmol/L   CO2 26 22 - 32 mmol/L   Glucose, Bld 366 (H) 70 - 99 mg/dL    Comment: Glucose reference range applies only to samples taken after fasting for at least 8 hours.   BUN 40 (H) 6 - 20 mg/dL   Creatinine, Ser 6.98 (H) 0.61 - 1.24 mg/dL   Calcium 8.3 (L) 8.9 - 10.3 mg/dL   Total Protein 6.4 (L) 6.5 - 8.1 g/dL   Albumin 1.8 (L) 3.5 - 5.0 g/dL   AST 13 (L) 15 - 41 U/L   [name] <5 0 - 44 U/L   Alkaline Phosphatase 427 (H) 38 - 126 U/L   Total Bilirubin 1.1 0.3 - 1.2 mg/dL   GFR, Estimated 9 (L) >60 mL/min    Comment: (NOTE) Calculated using the CKD-EPI Creatinine Equation (2021)    Anion gap 12 5 - 15    Comment: Performed at [facility], [place] [address] [place], [place] [address]  Lactic acid, plasma     Status: Abnormal   Collection Time: 10/07/20 12:48 PM  Result Value Ref Range   Lactic Acid, Venous 2.2 (HH) 0.5 -  1.9 mmol/L    Comment: CRITICAL RESULT CALLED TO, READ BACK BY AND VERIFIED WITH: Maureen Ralphs RN [phone] BY A BENNETT Performed at [facility], [place] [address], [place], [place] [address]   CBC with Differential     Status: Abnormal   Collection Time: 10/07/20 12:48 PM  Result Value Ref Range   WBC 10.1 4.0 - 10.5 K/uL   RBC 3.16 (L) 4.22 - 5.81 MIL/uL   Hemoglobin 9.0 (L) 13.0 - 17.0 g/dL   HCT 31.6 (L) 39.0 - 52.0 %   MCV 100.0 80.0 - 100.0 fL   MCH 28.5 26.0 - 34.0 pg   MCHC 28.5 (L) 30.0 - 36.0  g/dL   RDW 16.0 (H) 11.5 - 15.5 %   Platelets 357 150 - 400 K/uL   nRBC 0.2 0.0 - 0.2 %   Neutrophils Relative % 86 %   Neutro Abs 8.8 (H) 1.7 - 7.7 K/uL   Lymphocytes Relative 6 %   Lymphs Abs 0.6 (L) 0.7 - 4.0 K/uL   Monocytes Relative 5 %   Monocytes Absolute 0.6 0.1 - 1.0 K/uL   Eosinophils Relative 1 %   Eosinophils Absolute 0.1 0.0 - 0.5 K/uL   Basophils Relative 1 %   Basophils Absolute 0.1 0.0 - 0.1 K/uL   Immature Granulocytes 1 %   Abs Immature Granulocytes 0.06 0.00 - 0.07 K/uL    Comment: Performed at [facility] [address], [place], [place] [address]  Protime-INR     Status: Abnormal   Collection Time: 10/07/20 12:48 PM  Result Value Ref Range   Prothrombin Time 15.9 (H) 11.4 - 15.2 seconds   INR 1.3 (H) 0.8 - 1.2    Comment: (NOTE) INR goal varies based on device and disease states. Performed at [facility], [place] [address], [place], [place] [address]     DG Foot Complete Right  Result Date: 10/07/2020 CLINICAL DATA:  Wound on heel for 6 months, gangrene EXAM: RIGHT FOOT COMPLETE - 3+ VIEW COMPARISON:  09/24/2020 FINDINGS: Frontal, oblique, and lateral views of the right foot are obtained. There is a large soft tissue defect overlying the plantar aspect of the right calcaneus with underlying bony changes consistent with acute osteomyelitis. Interval development of extensive subcutaneous gas throughout the plantar soft tissues which could  reflect interval debridement or infection with gas-forming organism. Progressive soft tissue swelling of the remainder of the right foot. There is extensive periarticular osteopenia and diffuse osteoarthritis. Extensive vascular calcifications are noted. IMPRESSION: 1. Continued plantar soft tissue defect with underlying calcaneal osteomyelitis. Interval development of extensive subcutaneous gas could reflect interval debridement or infection with gas-forming organism. 2. Progressive soft tissue swelling of the right forefoot and midfoot. 3. Diffuse osteoarthritis and osteopenia. Electronically Signed   By: Randa Ngo M.D.   On: 10/07/2020 15:43    Review of Systems Blood pressure 109/66, pulse 93, temperature 98.1 F (36.7 C), temperature source Oral, resp. rate 17, SpO2 100 %. Physical Exam I did not change the bandages as they were just recently changed. No pain with calf compression.  Assessment/Plan: Osteomyelitis bilateral calcaneus  At this point I had a long discussion with the patient regards to his treatment options.  The patient is agreeable to amputation on the right side.  I discussed with him amputation of the left side as well given the calcaneal osteomyelitis.  Discussed with him with residual infection he is at risk of sepsis, death.  I had several discussion was with him at his last admission for this as well and he had refused amputation as well as debridement.  Previously been seen by vascular surgery as well.  For now continue IV antibiotics.  I was told Dr. Sharol Given will see the patient tomorrow morning.  Podiatry to follow-up if needed.   Trula Slade 10/07/2020, 6:31 PM

## 2020-10-08 ENCOUNTER — Other Ambulatory Visit: Payer: Self-pay

## 2020-10-08 ENCOUNTER — Encounter (HOSPITAL_COMMUNITY): Payer: Medicare Other

## 2020-10-08 ENCOUNTER — Ambulatory Visit: Payer: Medicare Other

## 2020-10-08 ENCOUNTER — Encounter (HOSPITAL_COMMUNITY): Payer: Self-pay | Admitting: Internal Medicine

## 2020-10-08 DIAGNOSIS — E11628 Type 2 diabetes mellitus with other skin complications: Secondary | ICD-10-CM

## 2020-10-08 DIAGNOSIS — L02619 Cutaneous abscess of unspecified foot: Secondary | ICD-10-CM | POA: Diagnosis not present

## 2020-10-08 DIAGNOSIS — M8600 Acute hematogenous osteomyelitis, unspecified site: Secondary | ICD-10-CM | POA: Diagnosis not present

## 2020-10-08 DIAGNOSIS — M86679 Other chronic osteomyelitis, unspecified ankle and foot: Secondary | ICD-10-CM

## 2020-10-08 DIAGNOSIS — L089 Local infection of the skin and subcutaneous tissue, unspecified: Secondary | ICD-10-CM

## 2020-10-08 DIAGNOSIS — S90811A Abrasion, right foot, initial encounter: Secondary | ICD-10-CM | POA: Diagnosis not present

## 2020-10-08 LAB — BASIC METABOLIC PANEL
Anion gap: 18 — ABNORMAL HIGH (ref 5–15)
BUN: 44 mg/dL — ABNORMAL HIGH (ref 6–20)
CO2: 21 mmol/L — ABNORMAL LOW (ref 22–32)
Calcium: 8.2 mg/dL — ABNORMAL LOW (ref 8.9–10.3)
Chloride: 95 mmol/L — ABNORMAL LOW (ref 98–111)
Creatinine, Ser: 6.99 mg/dL — ABNORMAL HIGH (ref 0.61–1.24)
GFR, Estimated: 9 mL/min — ABNORMAL LOW (ref >60)
Glucose, Bld: 272 mg/dL — ABNORMAL HIGH (ref 70–99)
Potassium: 4.5 mmol/L (ref 3.5–5.1)
Sodium: 134 mmol/L — ABNORMAL LOW (ref 135–145)

## 2020-10-08 LAB — RETICULOCYTES
Immature Retic Fract: 24.9 % — ABNORMAL HIGH (ref 2.3–15.9)
RBC.: 3.05 MIL/uL — ABNORMAL LOW (ref 4.22–5.81)
Retic Count, Absolute: 37.2 10*3/uL (ref 19.0–186.0)
Retic Ct Pct: 1.2 % (ref 0.4–3.1)

## 2020-10-08 LAB — CBC
HCT: 28.9 % — ABNORMAL LOW (ref 39.0–52.0)
Hemoglobin: 8.2 g/dL — ABNORMAL LOW (ref 13.0–17.0)
MCH: 28.9 pg (ref 26.0–34.0)
MCHC: 28.4 g/dL — ABNORMAL LOW (ref 30.0–36.0)
MCV: 101.8 fL — ABNORMAL HIGH (ref 80.0–100.0)
Platelets: 354 10*3/uL (ref 150–400)
RBC: 2.84 MIL/uL — ABNORMAL LOW (ref 4.22–5.81)
RDW: 16.3 % — ABNORMAL HIGH (ref 11.5–15.5)
WBC: 9.1 10*3/uL (ref 4.0–10.5)
nRBC: 0 % (ref 0.0–0.2)

## 2020-10-08 LAB — GLUCOSE, CAPILLARY
Glucose-Capillary: 250 mg/dL — ABNORMAL HIGH (ref 70–99)
Glucose-Capillary: 224 mg/dL — ABNORMAL HIGH (ref 70–99)

## 2020-10-08 MED ORDER — VANCOMYCIN HCL IN DEXTROSE 1-5 GM/200ML-% IV SOLN
1000.0000 mg | INTRAVENOUS | Status: DC
  Filled 2020-10-08: qty 200

## 2020-10-08 MED ORDER — COLLAGENASE 250 UNIT/GM EX OINT
TOPICAL_OINTMENT | Freq: Every day | CUTANEOUS | Status: DC
  Filled 2020-10-08 (×2): qty 30

## 2020-10-08 MED ORDER — INSULIN ASPART 100 UNIT/ML IJ SOLN
3.0000 [IU] | Freq: Once | INTRAMUSCULAR | Status: AC
  Administered 2020-10-08: 3 [IU] via SUBCUTANEOUS

## 2020-10-08 MED ORDER — ALBUMIN HUMAN 25 % IV SOLN
INTRAVENOUS | Status: AC
  Administered 2020-10-08: 25 g via INTRAVENOUS
  Filled 2020-10-08: qty 100

## 2020-10-08 MED ORDER — INSULIN GLARGINE 100 UNIT/ML ~~LOC~~ SOLN
10.0000 [IU] | Freq: Every day | SUBCUTANEOUS | Status: DC
  Administered 2020-10-08 – 2020-10-09 (×2): 10 [IU] via SUBCUTANEOUS
  Filled 2020-10-08 (×2): qty 0.1

## 2020-10-08 MED ORDER — PIPERACILLIN-TAZOBACTAM IN DEX 2-0.25 GM/50ML IV SOLN
2.2500 g | Freq: Three times a day (TID) | INTRAVENOUS | Status: AC
  Administered 2020-10-08 – 2020-10-11 (×10): 2.25 g via INTRAVENOUS
  Filled 2020-10-08 (×12): qty 50

## 2020-10-08 MED ORDER — INSULIN ASPART 100 UNIT/ML IJ SOLN
0.0000 [IU] | INTRAMUSCULAR | Status: DC
  Administered 2020-10-11: 1 [IU] via SUBCUTANEOUS
  Administered 2020-10-11: 2 [IU] via SUBCUTANEOUS
  Administered 2020-10-12 (×2): 1 [IU] via SUBCUTANEOUS
  Administered 2020-10-12 – 2020-10-16 (×5): 2 [IU] via SUBCUTANEOUS
  Administered 2020-10-17 (×2): 1 [IU] via SUBCUTANEOUS
  Administered 2020-10-17: 3 [IU] via SUBCUTANEOUS
  Administered 2020-10-18 – 2020-10-21 (×6): 1 [IU] via SUBCUTANEOUS

## 2020-10-08 MED ORDER — VANCOMYCIN HCL IN DEXTROSE 1-5 GM/200ML-% IV SOLN: 1000.0000 mg | INTRAVENOUS | Status: AC

## 2020-10-08 MED ORDER — DARBEPOETIN ALFA 100 MCG/0.5ML IJ SOSY
PREFILLED_SYRINGE | INTRAMUSCULAR | Status: AC
  Administered 2020-10-08: 100 ug via INTRAVENOUS
  Filled 2020-10-08: qty 0.5

## 2020-10-08 MED ORDER — DARBEPOETIN ALFA 100 MCG/0.5ML IJ SOSY
PREFILLED_SYRINGE | INTRAMUSCULAR | Status: AC
  Filled 2020-10-08: qty 0.5

## 2020-10-08 MED ORDER — OXYCODONE HCL 5 MG PO TABS
5.0000 mg | ORAL_TABLET | Freq: Every day | ORAL | Status: DC
  Administered 2020-10-08 – 2020-10-09 (×2): 5 mg via ORAL
  Filled 2020-10-08 (×4): qty 1

## 2020-10-08 MED ORDER — HYDROMORPHONE HCL 1 MG/ML IJ SOLN
INTRAMUSCULAR | Status: AC
  Administered 2020-10-08: 1 mg via INTRAVENOUS
  Filled 2020-10-08: qty 1

## 2020-10-08 MED ORDER — OXYCODONE-ACETAMINOPHEN 10-325 MG PO TABS: 1.0000 | ORAL_TABLET | Freq: Every day | ORAL | Status: DC

## 2020-10-08 MED ORDER — VANCOMYCIN HCL IN DEXTROSE 1-5 GM/200ML-% IV SOLN
INTRAVENOUS | Status: AC
  Administered 2020-10-08: 1000 mg via INTRAVENOUS
  Filled 2020-10-08: qty 200

## 2020-10-08 MED ORDER — OXYCODONE-ACETAMINOPHEN 5-325 MG PO TABS
1.0000 | ORAL_TABLET | Freq: Every day | ORAL | Status: DC
  Administered 2020-10-08 – 2020-10-09 (×2): 1 via ORAL
  Filled 2020-10-08 (×4): qty 1

## 2020-10-08 NOTE — Progress Notes (Signed)
When speaking with the pt moms wants to make sure he is alert and awake. If not she would like to be called to discuss care.

## 2020-10-08 NOTE — Procedures (Signed)
I was present at this dialysis session. I have reviewed the session itself and made appropriate changes.   Vital signs in last 24 hours:  Temp:  [97.4 F (36.3 C)-98.2 F (36.8 C)] 97.9 F (36.6 C) (07/20 0735) Pulse Rate:  [82-102] 94 (07/20 0800) Resp:  [9-21] 16 (07/20 0643) BP: (101-127)/(63-83) 105/63 (07/20 0800) SpO2:  [93 %-100 %] 95 % (07/20 0735) Weight:  [95.8 kg] 95.8 kg (07/20 0730) Weight change:  Filed Weights   10/08/20 0730  Weight: 95.8 kg    Recent Labs  Lab 10/08/20 0000  NA 134*  K 4.5  CL 95*  CO2 21*  GLUCOSE 272*  BUN 44*  CREATININE 6.99*  CALCIUM 8.2*    Recent Labs  Lab 10/07/20 1248 10/08/20 0000  WBC 10.1 9.1  NEUTROABS 8.8*  --   HGB 9.0* 8.2*  HCT 31.6* 28.9*  MCV 100.0 101.8*  PLT 357 354    Scheduled Meds:  (feeding supplement) PROSource Plus  30 mL Oral BID BM   aspirin  325 mg Oral Daily   atorvastatin  10 mg Oral QPM   calcium acetate  1,334 mg Oral TID WC   Chlorhexidine Gluconate Cloth  6 each Topical Q0600   cinacalcet  30 mg Oral Q breakfast   cycloSPORINE  1 drop Both Eyes Daily   darbepoetin (ARANESP) injection - DIALYSIS  100 mcg Intravenous Q Wed-HD   gabapentin  300 mg Oral TID   heparin  5,000 Units Subcutaneous Q8H   insulin aspart  0-9 Units Subcutaneous TID WC   insulin pump   Subcutaneous TID WC, HS, 0200   [START ON 10/09/2020] midodrine  5 mg Oral Q T,Th,Sat-1800   oxyCODONE-acetaminophen  1 tablet Oral 5 X Daily   sucroferric oxyhydroxide  500 mg Oral Q breakfast   Continuous Infusions:  sodium chloride     sodium chloride     albumin human 25 g (10/08/20 0251)   piperacillin-tazobactam (ZOSYN)  IV     [START ON 10/09/2020] vancomycin     vancomycin     PRN Meds:.sodium chloride, sodium chloride, acetaminophen **OR** acetaminophen, alteplase, heparin, HYDROmorphone (DILAUDID) injection, lidocaine (PF), lidocaine, lidocaine-prilocaine, mupirocin ointment, ondansetron, pentafluoroprop-tetrafluoroeth,  polyethylene glycol, polyvinyl alcohol, traMADol   Donetta Potts,  MD 10/08/2020, 8:30 AM

## 2020-10-08 NOTE — Progress Notes (Signed)
Pt off the unit to hemodialysis  

## 2020-10-08 NOTE — Progress Notes (Signed)
Inpatient Diabetes Program Recommendations  AACE/ADA: New Consensus Statement on Inpatient Glycemic Control (2015)  Target Ranges:  Prepandial:   less than 140 mg/dL      Peak postprandial:   less than 180 mg/dL (1-2 hours)      Critically ill patients:  140 - 180 mg/dL   Lab Results  Component Value Date   GLUCAP 224 (H) 10/08/2020   HGBA1C 7.7 (H) 09/24/2020    Review of Glycemic Control Results for [name] [name], [name] [name] (MRN [id number]) as of 10/08/2020 12:45  Ref. Range 10/07/2020 12:08 10/08/2020 01:55 10/08/2020 06:41  Glucose-Capillary Latest Ref Range: 70 - 99 mg/dL 356 (H) 250 (H) 224 (H)   Diabetes history: DM2 Outpatient Diabetes medications: Humalog via Omnipod Insulin Pump; Freestyle Libre Current orders for Inpatient glycemic control: Insulin pump, Novolog 0-9 units TID  Inpatient Diabetes Program Recommendations:    Consider  -Administering Lantus 10 units two hours prior to discontinuation of insulin pump, then Q24H to follow -changing correction to Novolog 0-6 units Q4H  Spoke with patient and mother at bedside. Freestyle located on left upper arm and Omnipod on upper mid abdomen. When asking patient questions, not responding appropriately; speech muffled. Mother states, "Cathy is like this when he returns from HD and then comes around." Mother cannot operate insulin pump.   Current insulin pump settings are as follows:  Basal insulin  0000-0700 0.6 units/hour 0701-0000 0.75 units/hour Total daily basal insulin: 16.95 units/24 hours  Carb Coverage 1:8 1 unit for every 8 grams of carbohydrates  Insulin Sensitivity 1:60 1 unit drops blood glucose 60 mg/dl  Target Glucose Goals 0000-0000 140-140 mg/dL  Insulin Duration : 6 hours   Discussed with mother and patient that it would be more appropriate to dose insulin with injections at this time. Education provided on patho of DM, glucose trends over last 48-72 hours >350 mg/dL, wound healing, risk for infection,  vascular changes and commorbidities. Patient and mother in agreement. Would consider continuing with insulin injections while inpatient, until improvement to mental status and following surgery. Secure chat sent to MD.     Thanks, Bronson Curb, MSN, RNC-OB Diabetes Coordinator [phone] (8a-5p)

## 2020-10-08 NOTE — Progress Notes (Signed)
Pharmacy Antibiotic Note  [name] [name] is a 55 y.o. male admitted on 10/07/2020 with  Lateral heel osteomyelitis, acute on chronic, failed outpatient antibiotic treatment  - ortho recommends amputation for source control but pt is hesitant. Pt on po doxy and flagyl PTA. Pharmacy has been consulted for Vancomycin and Zosyn dosing.  ESRD pt (usual T/T/S) - per nephrology, will likely get HD 7/20 due to high HD census for usual Tues HD as admitted later in the day.  Plan: Zosyn 2.25gm IV q8h Vancomycin 1000 mg IV Q HD hrs. Goal pre-HD level < 25 mcg/ml Will f/u HD schedule, micro data, and pt's clinical condition Vanc levels prn       Temp (24hrs), Avg:97.9 F (36.6 C), Min:97.4 F (36.3 C), Max:98.2 F (36.8 C)  Recent Labs  Lab 10/01/20 0740 10/07/20 1248 10/07/20 2033 10/08/20 0000  WBC  --  10.1  --  9.1  CREATININE 5.10* 6.98*  --  6.99*  LATICACIDVEN  --  2.2* 2.6*  --     Estimated Creatinine Clearance: 13.5 mL/min (A) (by C-G formula based on SCr of 6.99 mg/dL (H)).    No Known Allergies  Antimicrobials this admission: 7/19 Vanc >>  7/19 Zosyn >>   Microbiology results: 7/19 BCx:    Thank you for allowing pharmacy to be a part of this patient's care.  Sherlon Handing, PharmD, BCPS Please see amion for complete clinical pharmacist phone list 10/08/2020 3:01 AM

## 2020-10-08 NOTE — Progress Notes (Addendum)
PROGRESS NOTE    [name] [name]  [id number] DOB: 05-05-1965 DOA: 10/07/2020 PCP: [name], MD    Brief Narrative:  55 y/o male with history of IDDM, ESRD who was recently admitted with bilateral heel osteomyelitis and necrotic ulcers. He refused b/l BKA at that time. He was readmitted with worsening bilateral foot pain. He was seen by orthopedics and podiatry who are recommending bilateral bka.    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)   Chronic osteomyelitis of foot ([name])   Cutaneous abscess of foot   Bilateral heel osteomyelitis/abscess ulceration and necrosis -was recommended to have b/l BKA during last admission but refused -patient has been receiving IV cefepime as outpatient at his dialysis center  -it appears he was also receiving doxycycline and flagyl -readmitted for worsening pain  -restarted on IV antibiotics, vancomycin and zosyn -seen by podiatry and orthopedics, both recommending b/l BKA -patient says he is agreeable to R BKA at this time - I had a long discussion with patient regarding importance of source control of infection that will come with b/l BKA, risks of progression to sepsis and death if not properly treated -I also advised that it would be better to perform both surgeries at this time, rather than only performing surgery on 1 foot and then waiting until he is septic from other necrotic heel, where surgery would carry higher risk at that time. -patient says he will think about it and wants to discuss further with Dr. Sharol Given  ESRD -seen by nephrology -underwent HD on 7/20  Sacral wounds -WOC consulted  Anasarca -patient is hypoalbuminemic  -nutrition to follow  IDDM -chronically on insulin pump -will hold for now and start on lantus/SSI  Anemia of chronic disease -likely related to renal disease -his chronic foot infections likely also  contributing -continue to follow  Chronic systolic CHF -EF [id number] -volume management with dialysis -suspect that hypoalbuminemia largely contributing to anasarca     DVT prophylaxis: heparin injection 5,000 Units Start: 10/07/20 2200  Code Status: full code Family Communication: discussed with mother at the bedside and brother over the phone Disposition Plan: Status is: Inpatient  Remains inpatient appropriate because:Ongoing active pain requiring inpatient pain management, Ongoing diagnostic testing needed not appropriate for outpatient work up, IV treatments appropriate due to intensity of illness or inability to take PO, and Inpatient level of care appropriate due to severity of illness  Dispo: The patient is from: Home              Anticipated d/c is to:  TBD              Patient currently is medically stable to d/c.   Difficult to place patient No         Consultants:  Orthopedics, Dr. Sharol Given Nephrology Podiatry, Dr. Jacqualyn Posey  Procedures:    Antimicrobials:  Vancomycin 7/20> Zosyn 7/20>    Subjective: Continues to have pain in heels bilaterally and in his back. He is somnolent and often falls asleep during my visit  Objective: Vitals:   10/08/20 1030 10/08/20 1100 10/08/20 1148 10/08/20 1334  BP: 108/60 110/65 114/65 117/72  Pulse: (!) 104 (!) 105 (!) 105 (!) 106  Resp:   17 19  Temp:   99 F (37.2 C) 98 F (36.7 C)  TempSrc:   Oral   SpO2:   98% 97%  Weight:   91 kg  Intake/Output Summary (Last 24 hours) at 10/08/2020 1500 Last data filed at 10/08/2020 1148 Gross per 24 hour  Intake 223.73 ml  Output 4500 ml  Net -4276.27 ml   Filed Weights   10/08/20 0730 10/08/20 1148  Weight: 95.8 kg 91 kg    Examination:  General exam: Appears calm and comfortable  Respiratory system: Clear to auscultation. Respiratory effort normal. Cardiovascular system: S1 & S2 heard, RRR. No JVD, murmurs, rubs, gallops or clicks. No pedal  edema. Gastrointestinal system: Abdomen is nondistended, soft and nontender. No organomegaly or masses felt. Normal bowel sounds heard. Central nervous system: Alert and oriented. No focal neurological deficits. Extremities: bilateral necrotic ulcers on heels Skin: sacral wounds as pictured below Psychiatry: Judgement and insight appear normal. Mood & affect appropriate.            Data Reviewed: I have personally reviewed following labs and imaging studies  CBC: Recent Labs  Lab 10/07/20 1248 10/08/20 0000  WBC 10.1 9.1  NEUTROABS 8.8*  --   HGB 9.0* 8.2*  HCT 31.6* 28.9*  MCV 100.0 101.8*  PLT 357 [id number]   Basic Metabolic Panel: Recent Labs  Lab 10/07/20 1248 10/08/20 0000  NA 132* 134*  K 5.3* 4.5  CL 94* 95*  CO2 26 21*  GLUCOSE 366* 272*  BUN 40* 44*  CREATININE 6.98* 6.99*  CALCIUM 8.3* 8.2*   GFR: Estimated Creatinine Clearance: 12.5 mL/min (A) (by C-G formula based on SCr of 6.99 mg/dL (H)). Liver Function Tests: Recent Labs  Lab 10/07/20 1248  AST 13*  ALT <5  ALKPHOS 427*  BILITOT 1.1  PROT 6.4*  ALBUMIN 1.8*   No results for input(s): LIPASE, AMYLASE in the last 168 hours. No results for input(s): AMMONIA in the last 168 hours. Coagulation Profile: Recent Labs  Lab 10/07/20 1248  INR 1.3*   Cardiac Enzymes: No results for input(s): CKTOTAL, CKMB, CKMBINDEX, TROPONINI in the last 168 hours. BNP (last 3 results) No results for input(s): PROBNP in the last 8760 hours. HbA1C: No results for input(s): HGBA1C in the last 72 hours. CBG: Recent Labs  Lab 10/07/20 1208 10/08/20 0155 10/08/20 0641  GLUCAP 356* 250* 224*   Lipid Profile: No results for input(s): CHOL, HDL, LDLCALC, TRIG, CHOLHDL, LDLDIRECT in the last 72 hours. Thyroid Function Tests: No results for input(s): TSH, T4TOTAL, FREET4, T3FREE, THYROIDAB in the last 72 hours. Anemia Panel: Recent Labs    10/07/20 1900 10/08/20 0404  TIBC 118*  --   IRON 43*  --    RETICCTPCT  --  1.2   Sepsis Labs: Recent Labs  Lab 10/07/20 1248 10/07/20 2033  LATICACIDVEN 2.2* 2.6*    Recent Results (from the past 240 hour(s))  Culture, blood (Routine x 2)     Status: None (Preliminary result)   Collection Time: 10/07/20  1:06 PM   Specimen: BLOOD  Result Value Ref Range Status   Specimen Description BLOOD LEFT ANTECUBITAL  Final   Special Requests   Final    BOTTLES DRAWN AEROBIC AND ANAEROBIC Blood Culture results may not be optimal due to an inadequate volume of blood received in culture bottles   Culture   Final    NO GROWTH < 24 HOURS Performed at [facility], [place] [address], [place], [place] [address]    Report Status PENDING  Incomplete  SARS CORONAVIRUS 2 (TAT 6-24 HRS) Nasopharyngeal Nasopharyngeal Swab     Status: None   Collection Time: 10/07/20  4:39 PM  Specimen: Nasopharyngeal Swab  Result Value Ref Range Status   SARS Coronavirus 2 NEGATIVE NEGATIVE Final    Comment: (NOTE) SARS-CoV-2 target nucleic acids are NOT DETECTED.  The SARS-CoV-2 RNA is generally detectable in upper and lower respiratory specimens during the acute phase of infection. Negative results do not preclude SARS-CoV-2 infection, do not rule out co-infections with other pathogens, and should not be used as the sole basis for treatment or other patient management decisions. Negative results must be combined with clinical observations, patient history, and epidemiological information. The expected result is Negative.  Fact Sheet for Patients: SugarRoll.be  Fact Sheet for Healthcare Providers: https://www.woods-mathews.com/  This test is not yet approved or cleared by the [place] FDA and  has been authorized for detection and/or diagnosis of SARS-CoV-2 by FDA under an Emergency Use Authorization (EUA). This EUA will remain  in effect (meaning this test can be used) for the duration of the COVID-19 declaration  under Se ction 564(b)(1) of the Act, 21 U.S.C. section 360bbb-3(b)(1), unless the authorization is terminated or revoked sooner.  Performed at [facility], [place] [address], [place], [place] [address]   Culture, blood (Routine x 2)     Status: None (Preliminary result)   Collection Time: 10/07/20  8:07 PM   Specimen: BLOOD LEFT HAND  Result Value Ref Range Status   Specimen Description BLOOD LEFT HAND  Final   Special Requests   Final    BOTTLES DRAWN AEROBIC AND ANAEROBIC Blood Culture adequate volume   Culture   Final    NO GROWTH < 12 HOURS Performed at [facility], [place] [address], [place], [place] [address]    Report Status PENDING  Incomplete         Radiology Studies: DG Chest 1 View  Result Date: 10/07/2020 CLINICAL DATA:  Congestive heart failure. Shortness of breath. Chest pain. having surgery for a lower leg amputation tomorrow due to infections in his foot. Hx of htn. EXAM: CHEST  1 VIEW COMPARISON:  Chest x-ray 09/23/2020, CT chest 11/26/2018 FINDINGS: Enlarged cardiac silhouette. The heart size and mediastinal contours are unchanged. Bibasilar atelectasis. No focal consolidation. No pulmonary edema. No pleural effusion. No pneumothorax. No acute osseous abnormality. IMPRESSION: Bibasilar atelectasis with superimposed infection/inflammation not excluded. Electronically Signed   By: Iven Finn M.D.   On: 10/07/2020 18:48   DG Foot Complete Right  Result Date: 10/07/2020 CLINICAL DATA:  Wound on heel for 6 months, gangrene EXAM: RIGHT FOOT COMPLETE - 3+ VIEW COMPARISON:  09/24/2020 FINDINGS: Frontal, oblique, and lateral views of the right foot are obtained. There is a large soft tissue defect overlying the plantar aspect of the right calcaneus with underlying bony changes consistent with acute osteomyelitis. Interval development of extensive subcutaneous gas throughout the plantar soft tissues which could reflect interval debridement or infection with  gas-forming organism. Progressive soft tissue swelling of the remainder of the right foot. There is extensive periarticular osteopenia and diffuse osteoarthritis. Extensive vascular calcifications are noted. IMPRESSION: 1. Continued plantar soft tissue defect with underlying calcaneal osteomyelitis. Interval development of extensive subcutaneous gas could reflect interval debridement or infection with gas-forming organism. 2. Progressive soft tissue swelling of the right forefoot and midfoot. 3. Diffuse osteoarthritis and osteopenia. Electronically Signed   By: Randa Ngo M.D.   On: 10/07/2020 15:43        Scheduled Meds:  (feeding supplement) PROSource Plus  30 mL Oral BID BM   aspirin  325 mg Oral Daily  atorvastatin  10 mg Oral QPM   calcium acetate  1,334 mg Oral TID WC   Chlorhexidine Gluconate Cloth  6 each Topical Q0600   cinacalcet  30 mg Oral Q breakfast   collagenase   Topical Daily   cycloSPORINE  1 drop Both Eyes Daily   darbepoetin (ARANESP) injection - DIALYSIS  100 mcg Intravenous Q Wed-HD   gabapentin  300 mg Oral TID   heparin  5,000 Units Subcutaneous Q8H   insulin aspart  0-6 Units Subcutaneous Q4H   insulin aspart  0-9 Units Subcutaneous TID WC   insulin glargine  10 Units Subcutaneous Daily   [START ON 10/09/2020] midodrine  5 mg Oral Q T,Th,Sat-1800   oxyCODONE-acetaminophen  1 tablet Oral 5 X Daily   And   oxyCODONE  5 mg Oral 5 X Daily   sucroferric oxyhydroxide  500 mg Oral Q breakfast   Continuous Infusions:  albumin human 25 g (10/08/20 1025)   piperacillin-tazobactam (ZOSYN)  IV     [START ON 10/09/2020] vancomycin       LOS: 1 day    Time spent: 6mns    JKathie Dike MD [facility]   If 7PM-7AM, please contact night-coverage [email or web address]  10/08/2020, 3:00 PM

## 2020-10-08 NOTE — H&P (View-Only) (Signed)
ORTHOPAEDIC CONSULTATION  REQUESTING PHYSICIAN: Kathie Dike, MD  Chief Complaint: Chronic bilateral heel gangrenous ulcers with odor drainage and pain.  HPI: [name] [name] is a 55 y.o. male who presents with uncontrolled type 2 diabetes with chronic necrotic ulcers of both heels with odor drainage and exposed bone.  Past Medical History:  Diagnosis Date   Anal infection    posterior anal canal   Anemia, chronic renal failure    Atrophic kidney    BILATERAL   DM type 2 causing ESRD ([place])    Nephrologist-- dr [name] [facility])--  on hemodialysis since June 2012 at  [name]  TTS   Hemodialysis patient [facility])    at [place] on Tues/ Thur/Sat/schedule   Hemorrhoids    Hepatitis B antibody positive    History of pleural effusion    bilateral   Hyperparathyroidism, secondary renal ([place])    Hypertension    Ischemic cardiomyopathy    per echo 07-01-2014  ef 45%   LAFB (left anterior fascicular block)    Peripheral neuropathy    Systolic and diastolic CHF, chronic ([place])    CARDIOLOGIST-  DR Daneen Schick ([place])  AND DR [name] ([name])   Past Surgical History:  Procedure Laterality Date   APPENDECTOMY  09-12-2004   laparotomy w/ drainage peritinitis   AV FISTULA PLACEMENT  02-27-2010   right forearm (RADIOCEPHALIC)   AV FISTULA REPAIR  10-30-2010   BONE BIOPSY Left 05/14/2020   Procedure: BONE BIOPSY;  Surgeon: Trula Slade, DPM;  Location: [place];  Service: Podiatry;  Laterality: Left;   CARDIOVASCULAR STRESS TEST  10-29-2011   dr Daneen Schick   Low risk scan;  mild perfusion defect seen in the basal inferoseptal, basal inferior and mid inferior regions consistent with an infarct/scar and/or overlying attenuation/  mild to moderate global LVSF,  ef 40-45%   DOBUTAMINE STRESS ECHO  07-23-2012   [name]   abnormal ;  at rest estimated lvef 25-30% and global severe LV hypokinesis ;  no cp during stress and  achieved 85% maxium predicted heart rate;  negative stress ECG for inducible ischemia;  estimated lvef with stress 35-40%;  augmentation of wall segments consistant with cardiomyopathy and differential fibrosis   FISTULOTOMY N/A 03/26/2015   Procedure: FISTULOTOMY;  Surgeon: [name], MD;  Location: [facility];  Service: General;  Laterality: N/A;   GRAFT APPLICATION Left 0000000   Procedure: GRAFT APPLICATION;  Surgeon: Trula Slade, DPM;  Location: [place];  Service: Podiatry;  Laterality: Left;   GRAFT APPLICATION Bilateral 0000000   Procedure: GRAFT APPLICATION;  Surgeon: Trula Slade, DPM;  Location: [facility];  Service: Podiatry;  Laterality: Bilateral;   INCISION AND DRAINAGE ABSCESS N/A 03/26/2015   Procedure: ANAL INCISION AND DRAINAGE;  Surgeon: [name], MD;  Location: [place];  Service: General;  Laterality: N/A;   INGUINAL LYMPH NODE BIOPSY Right 10/01/2020   Procedure: EXCISIONAL BIOPSY OF RIGHT INGUINAL LYMPH NODE;  Surgeon: [name], MD;  Location: [place];  Service: General;  Laterality: Right;   RETINAL DETACHMENT SURGERY Left 2011   incomplete repair/ needs eye drops to keep pressure down   TEE WITHOUT CARDIOVERSION N/A 10/17/2017   Procedure: TRANSESOPHAGEAL ECHOCARDIOGRAM (TEE);  Surgeon: Josue Hector, MD;  Location: [facility];  Service: Cardiovascular;  Laterality: N/A;   TRANSTHORACIC ECHOCARDIOGRAM  07-01-2014    done at [facility]   grade 1  diastolic dysfunction,  ef 45%/  trace TR and PR   WOUND DEBRIDEMENT Left 05/14/2020   Procedure: DEBRIDEMENT WOUND;  Surgeon: Trula Slade, DPM;  Location: [place];  Service: Podiatry;  Laterality: Left;   WOUND DEBRIDEMENT Bilateral 06/04/2020   Procedure: DEBRIDEMENT WOUND BOTH FEET;  Surgeon: Trula Slade, DPM;  Location: [facility];  Service: Podiatry;  Laterality: Bilateral;   Social History   Socioeconomic History   Marital status: Single     Spouse name: Not on file   Number of children: Not on file   Years of education: Not on file   Highest education level: Not on file  Occupational History   Occupation: Retired  Tobacco Use   Smoking status: Never   Smokeless tobacco: Never  Vaping Use   Vaping Use: Never used  Substance and Sexual Activity   Alcohol use: No   Drug use: No   Sexual activity: Not Currently  Other Topics Concern   Not on file  Social History Narrative   Not on file   Social Determinants of Health   Financial Resource Strain: Not on file  Food Insecurity: Not on file  Transportation Needs: Not on file  Physical Activity: Not on file  Stress: Not on file  Social Connections: Not on file   Family History  Problem Relation Age of Onset   Diabetes Other    - negative except otherwise stated in the family history section No Known Allergies Prior to Admission medications   Medication Sig Start Date End Date Taking? Authorizing Provider  aspirin 325 MG tablet Take 325 mg by mouth daily.    [provider]  atorvastatin (LIPITOR) 10 MG tablet Take 10 mg by mouth every evening.    [provider]  calcium acetate (PHOSLO) 667 MG capsule Take 1,334 mg by mouth 3 (three) times daily with meals. 06/02/10   [provider]  cinacalcet (SENSIPAR) 30 MG tablet Take 30 mg by mouth daily.    [provider]  collagenase (SANTYL) ointment Apply 1 application topically daily. 06/23/20   McDonald, Stephan Minister, DPM  Continuous Blood Gluc Sensor (FREESTYLE LIBRE 14 DAY SENSOR) MISC See admin instructions. 04/08/20   [provider]  cycloSPORINE, PF, (CEQUA) 0.09 % SOLN Place 1 drop into both eyes daily.    [provider]  doxycycline (ADOXA) 100 MG tablet Take 1 tablet (100 mg total) by mouth 2 (two) times daily. 09/26/20 11/07/20  Debbe Odea, MD  gabapentin (NEURONTIN) 300 MG capsule Take 300 mg by mouth 3 (three) times daily. 05/09/15   [provider]   Insulin Disposable Pump (OMNIPOD DASH 5 PACK PODS) MISC Inject into the skin. Use with Humalog PUMP 08/28/19   [provider]  insulin lispro (HUMALOG) 100 UNIT/ML injection PUMP    [provider]  lidocaine (XYLOCAINE) 5 % ointment Apply 1 application topically 2 (two) times daily as needed (finger pain). 07/14/19   [provider]  metroNIDAZOLE (FLAGYL) 500 MG tablet Take 1 tablet (500 mg total) by mouth 3 (three) times daily for 14 days. 09/26/20 10/10/20  Debbe Odea, MD  midodrine (PROAMATINE) 5 MG tablet Take 5 mg by mouth every Tuesday, Thursday, and Saturday at 6 PM.    [provider]  Multiple Vitamin (MULTIVITAMIN WITH MINERALS) TABS tablet Take 1 tablet by mouth daily in the afternoon.    [provider]  mupirocin ointment (BACTROBAN) 2 % Apply 1 application topically daily as needed (itching). 07/16/20  [provider]  ondansetron (ZOFRAN) 4 MG tablet Take 4 mg by mouth 3 (three) times daily as needed for nausea or vomiting. 06/27/19   [provider]  oxyCODONE-acetaminophen (PERCOCET) 10-325 MG tablet Take 1 tablet by mouth 5 (five) times daily. 09/12/20   [provider]  Polyethyl Glycol-Propyl Glycol (SYSTANE) 0.4-0.3 % SOLN Place 1 drop into both eyes daily as needed (dry eyes).    [provider]  sucroferric oxyhydroxide (VELPHORO) 500 MG chewable tablet Chew 500 mg by mouth daily.    [provider]  traMADol (ULTRAM) 50 MG tablet Take 1-2 tablets (50-100 mg total) by mouth every 6 (six) hours as needed. 10/01/20   [name], MD  vitamin B-12 (CYANOCOBALAMIN) 500 MCG tablet Take 500 mcg by mouth daily.    [provider]   DG Chest 1 View  Result Date: 10/07/2020 CLINICAL DATA:  Congestive heart failure. Shortness of breath. Chest pain. having surgery for a lower leg amputation tomorrow due to infections in his foot. Hx of htn. EXAM: CHEST  1 VIEW COMPARISON:  Chest x-ray  09/23/2020, CT chest 11/26/2018 FINDINGS: Enlarged cardiac silhouette. The heart size and mediastinal contours are unchanged. Bibasilar atelectasis. No focal consolidation. No pulmonary edema. No pleural effusion. No pneumothorax. No acute osseous abnormality. IMPRESSION: Bibasilar atelectasis with superimposed infection/inflammation not excluded. Electronically Signed   By: Iven Finn M.D.   On: 10/07/2020 18:48   DG Foot Complete Right  Result Date: 10/07/2020 CLINICAL DATA:  Wound on heel for 6 months, gangrene EXAM: RIGHT FOOT COMPLETE - 3+ VIEW COMPARISON:  09/24/2020 FINDINGS: Frontal, oblique, and lateral views of the right foot are obtained. There is a large soft tissue defect overlying the plantar aspect of the right calcaneus with underlying bony changes consistent with acute osteomyelitis. Interval development of extensive subcutaneous gas throughout the plantar soft tissues which could reflect interval debridement or infection with gas-forming organism. Progressive soft tissue swelling of the remainder of the right foot. There is extensive periarticular osteopenia and diffuse osteoarthritis. Extensive vascular calcifications are noted. IMPRESSION: 1. Continued plantar soft tissue defect with underlying calcaneal osteomyelitis. Interval development of extensive subcutaneous gas could reflect interval debridement or infection with gas-forming organism. 2. Progressive soft tissue swelling of the right forefoot and midfoot. 3. Diffuse osteoarthritis and osteopenia. Electronically Signed   By: Randa Ngo M.D.   On: 10/07/2020 15:43   - pertinent xrays, CT, MRI studies were reviewed and independently interpreted  Positive ROS: All other systems have been reviewed and were otherwise negative with the exception of those mentioned in the HPI and as above.  Physical Exam: General: Alert, no acute distress Psychiatric: Patient is competent for consent with normal mood and affect Lymphatic: No  axillary or cervical lymphadenopathy Cardiovascular: No pedal edema Respiratory: No cyanosis, no use of accessory musculature GI: No organomegaly, abdomen is soft and non-tender    Images:  '@ENCIMAGES'$ @  Labs:  Lab Results  Component Value Date   HGBA1C 7.7 (H) 09/24/2020   HGBA1C 7.6 (H) 05/13/2020   HGBA1C 10.7 (H) 03/13/2016   ESRSEDRATE 63 (H) 05/13/2020   ESRSEDRATE 116 (H) 11/04/2017   ESRSEDRATE 23 (H) 09/26/2017   CRP 13.5 (H) 05/13/2020   CRP 28 (H) 11/04/2017   CRP 2.3 (H) 09/26/2017   REPTSTATUS 05/21/2020 FINAL 05/14/2020   GRAMSTAIN NO WBC SEEN NO ORGANISMS SEEN  05/14/2020   CULT  05/14/2020    FEW ACTINOMYCES SPECIES NO ANAEROBES ISOLATED Standardized susceptibility testing for this organism  is not available. Performed at [facility], [place] [address], [place], [place] [address]    LABORGA ENTEROCOCCUS FAECALIS 06/13/2019    Lab Results  Component Value Date   ALBUMIN 1.8 (L) 10/07/2020   ALBUMIN 1.8 (L) 09/24/2020   ALBUMIN 1.7 (L) 07/18/2020   PREALBUMIN 7.3 (L) 05/13/2020     CBC EXTENDED Latest Ref Rng & Units 10/08/2020 10/08/2020 10/07/2020  WBC 4.0 - 10.5 K/uL 9.1 - 10.1  RBC 4.22 - 5.81 MIL/uL 3.05(L) 2.84(L) 3.16(L)  HGB 13.0 - 17.0 g/dL 8.2(L) - 9.0(L)  HCT 39.0 - 52.0 % 28.9(L) - 31.6(L)  PLT 150 - 400 K/uL 354 - 357  NEUTROABS 1.7 - 7.7 K/uL - - 8.8(H)  LYMPHSABS 0.7 - 4.0 K/uL - - 0.6(L)    Neurologic: Patient does not have protective sensation bilateral lower extremities.   MUSCULOSKELETAL:   Skin: Examination patient has venous and lymphatic insufficiency of both lower extremities with brawny skin color changes and swelling in both legs.  There are no ulcers in either leg.  Examination of both feet patient has a large full-thickness necrotic ulcer over the heel bilaterally.  There is odor drainage.  The ulcer is probe to bone.  Review of the MRI scan shows chronic osteomyelitis of the calcaneus bilaterally.  Patient has  severe protein caloric malnutrition with an albumin of 1.8.  Hemoglobin A1c 7.7  Assessment: Assessment: Diabetic insensate neuropathy with end-stage renal disease on dialysis with severe protein caloric malnutrition with osteomyelitis abscess ulceration and necrosis of both heels.  Plan: I have discussed with the patient recommendation to proceed with bilateral transtibial amputations.  Patient states he would like to proceed with surgery on the right foot and hold surgery on the left foot.  Discussed that with the extensive bone infection and necrotic tissue it would be AGAINST MEDICAL ADVICE to proceed with 1 foot and not addressed the infection in the other foot.  I will readdress all issues tomorrow morning and answer all questions.  Anticipate surgery on Friday.  Thank you for the consult and the opportunity to see [name], [place] [phone] 6:42 AM

## 2020-10-08 NOTE — Consult Note (Signed)
ORTHOPAEDIC CONSULTATION  REQUESTING PHYSICIAN: Kathie Dike, MD  Chief Complaint: Chronic bilateral heel gangrenous ulcers with odor drainage and pain.  HPI: [name] [name] is a 55 y.o. male who presents with uncontrolled type 2 diabetes with chronic necrotic ulcers of both heels with odor drainage and exposed bone.  Past Medical History:  Diagnosis Date   Anal infection    posterior anal canal   Anemia, chronic renal failure    Atrophic kidney    BILATERAL   DM type 2 causing ESRD ([place])    Nephrologist-- dr [name] [facility])--  on hemodialysis since June 2012 at  [name]  TTS   Hemodialysis patient [facility])    at [place] on Tues/ Thur/Sat/schedule   Hemorrhoids    Hepatitis B antibody positive    History of pleural effusion    bilateral   Hyperparathyroidism, secondary renal ([place])    Hypertension    Ischemic cardiomyopathy    per echo 07-01-2014  ef 45%   LAFB (left anterior fascicular block)    Peripheral neuropathy    Systolic and diastolic CHF, chronic ([place])    CARDIOLOGIST-  DR Daneen Schick ([place])  AND DR [name] ([name])   Past Surgical History:  Procedure Laterality Date   APPENDECTOMY  09-12-2004   laparotomy w/ drainage peritinitis   AV FISTULA PLACEMENT  02-27-2010   right forearm (RADIOCEPHALIC)   AV FISTULA REPAIR  10-30-2010   BONE BIOPSY Left 05/14/2020   Procedure: BONE BIOPSY;  Surgeon: Trula Slade, DPM;  Location: [place];  Service: Podiatry;  Laterality: Left;   CARDIOVASCULAR STRESS TEST  10-29-2011   dr Daneen Schick   Low risk scan;  mild perfusion defect seen in the basal inferoseptal, basal inferior and mid inferior regions consistent with an infarct/scar and/or overlying attenuation/  mild to moderate global LVSF,  ef 40-45%   DOBUTAMINE STRESS ECHO  07-23-2012   [name]   abnormal ;  at rest estimated lvef 25-30% and global severe LV hypokinesis ;  no cp during stress and  achieved 85% maxium predicted heart rate;  negative stress ECG for inducible ischemia;  estimated lvef with stress 35-40%;  augmentation of wall segments consistant with cardiomyopathy and differential fibrosis   FISTULOTOMY N/A 03/26/2015   Procedure: FISTULOTOMY;  Surgeon: [name], MD;  Location: [facility];  Service: General;  Laterality: N/A;   GRAFT APPLICATION Left 0000000   Procedure: GRAFT APPLICATION;  Surgeon: Trula Slade, DPM;  Location: [place];  Service: Podiatry;  Laterality: Left;   GRAFT APPLICATION Bilateral 0000000   Procedure: GRAFT APPLICATION;  Surgeon: Trula Slade, DPM;  Location: [facility];  Service: Podiatry;  Laterality: Bilateral;   INCISION AND DRAINAGE ABSCESS N/A 03/26/2015   Procedure: ANAL INCISION AND DRAINAGE;  Surgeon: [name], MD;  Location: [place];  Service: General;  Laterality: N/A;   INGUINAL LYMPH NODE BIOPSY Right 10/01/2020   Procedure: EXCISIONAL BIOPSY OF RIGHT INGUINAL LYMPH NODE;  Surgeon: [name], MD;  Location: [place];  Service: General;  Laterality: Right;   RETINAL DETACHMENT SURGERY Left 2011   incomplete repair/ needs eye drops to keep pressure down   TEE WITHOUT CARDIOVERSION N/A 10/17/2017   Procedure: TRANSESOPHAGEAL ECHOCARDIOGRAM (TEE);  Surgeon: Josue Hector, MD;  Location: [facility]OS[facility];  Service: Cardiovascular;  Laterality: N/A;   TRANSTHORACIC ECHOCARDIOGRAM  07-01-2014    done at [facility]   grade 1  diastolic dysfunction,  ef 45%/  trace TR and PR   WOUND DEBRIDEMENT Left 05/14/2020   Procedure: DEBRIDEMENT WOUND;  Surgeon: Trula Slade, DPM;  Location: [place];  Service: Podiatry;  Laterality: Left;   WOUND DEBRIDEMENT Bilateral 06/04/2020   Procedure: DEBRIDEMENT WOUND BOTH FEET;  Surgeon: Trula Slade, DPM;  Location: [facility];  Service: Podiatry;  Laterality: Bilateral;   Social History   Socioeconomic History   Marital status: Single     Spouse name: Not on file   Number of children: Not on file   Years of education: Not on file   Highest education level: Not on file  Occupational History   Occupation: Retired  Tobacco Use   Smoking status: Never   Smokeless tobacco: Never  Vaping Use   Vaping Use: Never used  Substance and Sexual Activity   Alcohol use: No   Drug use: No   Sexual activity: Not Currently  Other Topics Concern   Not on file  Social History Narrative   Not on file   Social Determinants of Health   Financial Resource Strain: Not on file  Food Insecurity: Not on file  Transportation Needs: Not on file  Physical Activity: Not on file  Stress: Not on file  Social Connections: Not on file   Family History  Problem Relation Age of Onset   Diabetes Other    - negative except otherwise stated in the family history section No Known Allergies Prior to Admission medications   Medication Sig Start Date End Date Taking? Authorizing Provider  aspirin 325 MG tablet Take 325 mg by mouth daily.    [provider]  atorvastatin (LIPITOR) 10 MG tablet Take 10 mg by mouth every evening.    [provider]  calcium acetate (PHOSLO) 667 MG capsule Take 1,334 mg by mouth 3 (three) times daily with meals. 06/02/10   [provider]  cinacalcet (SENSIPAR) 30 MG tablet Take 30 mg by mouth daily.    [provider]  collagenase (SANTYL) ointment Apply 1 application topically daily. 06/23/20   McDonald, Stephan Minister, DPM  Continuous Blood Gluc Sensor (FREESTYLE LIBRE 14 DAY SENSOR) MISC See admin instructions. 04/08/20   [provider]  cycloSPORINE, PF, (CEQUA) 0.09 % SOLN Place 1 drop into both eyes daily.    [provider]  doxycycline (ADOXA) 100 MG tablet Take 1 tablet (100 mg total) by mouth 2 (two) times daily. 09/26/20 11/07/20  Debbe Odea, MD  gabapentin (NEURONTIN) 300 MG capsule Take 300 mg by mouth 3 (three) times daily. 05/09/15   [provider]   Insulin Disposable Pump (OMNIPOD DASH 5 PACK PODS) MISC Inject into the skin. Use with Humalog PUMP 08/28/19   [provider]  insulin lispro (HUMALOG) 100 UNIT/ML injection PUMP    [provider]  lidocaine (XYLOCAINE) 5 % ointment Apply 1 application topically 2 (two) times daily as needed (finger pain). 07/14/19   [provider]  metroNIDAZOLE (FLAGYL) 500 MG tablet Take 1 tablet (500 mg total) by mouth 3 (three) times daily for 14 days. 09/26/20 10/10/20  Debbe Odea, MD  midodrine (PROAMATINE) 5 MG tablet Take 5 mg by mouth every Tuesday, Thursday, and Saturday at 6 PM.    [provider]  Multiple Vitamin (MULTIVITAMIN WITH MINERALS) TABS tablet Take 1 tablet by mouth daily in the afternoon.    [provider]  mupirocin ointment (BACTROBAN) 2 % Apply 1 application topically daily as needed (itching). 07/16/20  [provider]  ondansetron (ZOFRAN) 4 MG tablet Take 4 mg by mouth 3 (three) times daily as needed for nausea or vomiting. 06/27/19   [provider]  oxyCODONE-acetaminophen (PERCOCET) 10-325 MG tablet Take 1 tablet by mouth 5 (five) times daily. 09/12/20   [provider]  Polyethyl Glycol-Propyl Glycol (SYSTANE) 0.4-0.3 % SOLN Place 1 drop into both eyes daily as needed (dry eyes).    [provider]  sucroferric oxyhydroxide (VELPHORO) 500 MG chewable tablet Chew 500 mg by mouth daily.    [provider]  traMADol (ULTRAM) 50 MG tablet Take 1-2 tablets (50-100 mg total) by mouth every 6 (six) hours as needed. 10/01/20   [name], MD  vitamin B-12 (CYANOCOBALAMIN) 500 MCG tablet Take 500 mcg by mouth daily.    [provider]   DG Chest 1 View  Result Date: 10/07/2020 CLINICAL DATA:  Congestive heart failure. Shortness of breath. Chest pain. having surgery for a lower leg amputation tomorrow due to infections in his foot. Hx of htn. EXAM: CHEST  1 VIEW COMPARISON:  Chest x-ray  09/23/2020, CT chest 11/26/2018 FINDINGS: Enlarged cardiac silhouette. The heart size and mediastinal contours are unchanged. Bibasilar atelectasis. No focal consolidation. No pulmonary edema. No pleural effusion. No pneumothorax. No acute osseous abnormality. IMPRESSION: Bibasilar atelectasis with superimposed infection/inflammation not excluded. Electronically Signed   By: Iven Finn M.D.   On: 10/07/2020 18:48   DG Foot Complete Right  Result Date: 10/07/2020 CLINICAL DATA:  Wound on heel for 6 months, gangrene EXAM: RIGHT FOOT COMPLETE - 3+ VIEW COMPARISON:  09/24/2020 FINDINGS: Frontal, oblique, and lateral views of the right foot are obtained. There is a large soft tissue defect overlying the plantar aspect of the right calcaneus with underlying bony changes consistent with acute osteomyelitis. Interval development of extensive subcutaneous gas throughout the plantar soft tissues which could reflect interval debridement or infection with gas-forming organism. Progressive soft tissue swelling of the remainder of the right foot. There is extensive periarticular osteopenia and diffuse osteoarthritis. Extensive vascular calcifications are noted. IMPRESSION: 1. Continued plantar soft tissue defect with underlying calcaneal osteomyelitis. Interval development of extensive subcutaneous gas could reflect interval debridement or infection with gas-forming organism. 2. Progressive soft tissue swelling of the right forefoot and midfoot. 3. Diffuse osteoarthritis and osteopenia. Electronically Signed   By: Randa Ngo M.D.   On: 10/07/2020 15:43   - pertinent xrays, CT, MRI studies were reviewed and independently interpreted  Positive ROS: All other systems have been reviewed and were otherwise negative with the exception of those mentioned in the HPI and as above.  Physical Exam: General: Alert, no acute distress Psychiatric: Patient is competent for consent with normal mood and affect Lymphatic: No  axillary or cervical lymphadenopathy Cardiovascular: No pedal edema Respiratory: No cyanosis, no use of accessory musculature GI: No organomegaly, abdomen is soft and non-tender    Images:  '@ENCIMAGES'$ @  Labs:  Lab Results  Component Value Date   HGBA1C 7.7 (H) 09/24/2020   HGBA1C 7.6 (H) 05/13/2020   HGBA1C 10.7 (H) 03/13/2016   ESRSEDRATE 63 (H) 05/13/2020   ESRSEDRATE 116 (H) 11/04/2017   ESRSEDRATE 23 (H) 09/26/2017   CRP 13.5 (H) 05/13/2020   CRP 28 (H) 11/04/2017   CRP 2.3 (H) 09/26/2017   REPTSTATUS 05/21/2020 FINAL 05/14/2020   GRAMSTAIN NO WBC SEEN NO ORGANISMS SEEN  05/14/2020   CULT  05/14/2020    FEW ACTINOMYCES SPECIES NO ANAEROBES ISOLATED Standardized susceptibility testing for this organism  is not available. Performed at [facility], [place] [address], [place], [place] [address]    LABORGA ENTEROCOCCUS FAECALIS 06/13/2019    Lab Results  Component Value Date   ALBUMIN 1.8 (L) 10/07/2020   ALBUMIN 1.8 (L) 09/24/2020   ALBUMIN 1.7 (L) 07/18/2020   PREALBUMIN 7.3 (L) 05/13/2020     CBC EXTENDED Latest Ref Rng & Units 10/08/2020 10/08/2020 10/07/2020  WBC 4.0 - 10.5 K/uL 9.1 - 10.1  RBC 4.22 - 5.81 MIL/uL 3.05(L) 2.84(L) 3.16(L)  HGB 13.0 - 17.0 g/dL 8.2(L) - 9.0(L)  HCT 39.0 - 52.0 % 28.9(L) - 31.6(L)  PLT 150 - 400 K/uL 354 - 357  NEUTROABS 1.7 - 7.7 K/uL - - 8.8(H)  LYMPHSABS 0.7 - 4.0 K/uL - - 0.6(L)    Neurologic: Patient does not have protective sensation bilateral lower extremities.   MUSCULOSKELETAL:   Skin: Examination patient has venous and lymphatic insufficiency of both lower extremities with brawny skin color changes and swelling in both legs.  There are no ulcers in either leg.  Examination of both feet patient has a large full-thickness necrotic ulcer over the heel bilaterally.  There is odor drainage.  The ulcer is probe to bone.  Review of the MRI scan shows chronic osteomyelitis of the calcaneus bilaterally.  Patient has  severe protein caloric malnutrition with an albumin of 1.8.  Hemoglobin A1c 7.7  Assessment: Assessment: Diabetic insensate neuropathy with end-stage renal disease on dialysis with severe protein caloric malnutrition with osteomyelitis abscess ulceration and necrosis of both heels.  Plan: I have discussed with the patient recommendation to proceed with bilateral transtibial amputations.  Patient states he would like to proceed with surgery on the right foot and hold surgery on the left foot.  Discussed that with the extensive bone infection and necrotic tissue it would be AGAINST MEDICAL ADVICE to proceed with 1 foot and not addressed the infection in the other foot.  I will readdress all issues tomorrow morning and answer all questions.  Anticipate surgery on Friday.  Thank you for the consult and the opportunity to see Mr. Victorino Dike, [place] [phone] 6:42 AM

## 2020-10-08 NOTE — Progress Notes (Signed)
Insulin pump deactivated and removed by pt and mom.

## 2020-10-08 NOTE — Progress Notes (Signed)
New Admission Note:   Arrival Method: stretcher Mental Orientation: alert x 4 Telemetry: box 19 Assessment: Completed Skin: see flowsheet IV: NSL Pain: none Tubes: none Safety Measures: Safety Fall Prevention Plan has been discussed Admission: Completed [facility] Orientation: Patient has been orientated to the room, unit and staff.  Family: none at side  Orders have been reviewed and implemented. Will continue to monitor the patient. Call light has been placed within reach and bed alarm has been activated.   Rockie Neighbours BSN, RN Phone number: [phone]

## 2020-10-08 NOTE — Consult Note (Addendum)
[facility] Nurse Consult Note: Reason for Consult: WOC Nursing is simultaneously consulted with Orthopedics. Patient has been seen by Podiatric Medicine.  Bilateral transtibial amputation has been recommended.  Patient has agreed to procedure on right; two physicians are recommending left as well due to chronic osteomyelitis in left heel Wound type: Infectious Pressure Injury POA: N/A  Dry gauze dressings are recommended for Nursing to perform daily until surgery/decision regarding amputation is made. Defer to Orthopedics on these wounds.  Two Unstageable pressure injuries are noted to the thoracic and coccyx areas, POA.  Nursing is to measure and document measurements on the Nursing Flow Sheet later today. Topical care will consist of a daily application of an enzymatic debriding agent (collagenase, Santyl) topped with a saline dressing and covered with a dry gauze dressing, then silicone foam to secure. Turning and repositioning and Nutrition will be essential elements of this POC.  A mattress replacement with low air loss feature is ordered.  Please order Nutritional consult if you agree.  Discussed with Dr. Roderic Palau via [place] who will take a photo of the two Unstageable pressure injuries mentioned above when rounding and upload to the patient's EHR.  [place] nursing team will not follow, but will remain available to this patient, the nursing and medical teams.  Please re-consult if needed. Thanks, Maudie Flakes, MSN, RN, [place], Arther Abbott  Pager# [phone]

## 2020-10-09 ENCOUNTER — Ambulatory Visit: Payer: Medicare Other | Admitting: Podiatry

## 2020-10-09 ENCOUNTER — Other Ambulatory Visit: Payer: Self-pay | Admitting: Physician Assistant

## 2020-10-09 DIAGNOSIS — S90811A Abrasion, right foot, initial encounter: Secondary | ICD-10-CM | POA: Diagnosis not present

## 2020-10-09 DIAGNOSIS — E11628 Type 2 diabetes mellitus with other skin complications: Secondary | ICD-10-CM | POA: Diagnosis not present

## 2020-10-09 DIAGNOSIS — M86679 Other chronic osteomyelitis, unspecified ankle and foot: Secondary | ICD-10-CM | POA: Diagnosis not present

## 2020-10-09 DIAGNOSIS — M8600 Acute hematogenous osteomyelitis, unspecified site: Secondary | ICD-10-CM | POA: Diagnosis not present

## 2020-10-09 LAB — BASIC METABOLIC PANEL
Anion gap: 14 (ref 5–15)
BUN: 30 mg/dL — ABNORMAL HIGH (ref 6–20)
CO2: 24 mmol/L (ref 22–32)
Calcium: 8.7 mg/dL — ABNORMAL LOW (ref 8.9–10.3)
Chloride: 95 mmol/L — ABNORMAL LOW (ref 98–111)
Creatinine, Ser: 5.31 mg/dL — ABNORMAL HIGH (ref 0.61–1.24)
GFR, Estimated: 12 mL/min — ABNORMAL LOW (ref >60)
Glucose, Bld: 118 mg/dL — ABNORMAL HIGH (ref 70–99)
Potassium: 4.1 mmol/L (ref 3.5–5.1)
Sodium: 133 mmol/L — ABNORMAL LOW (ref 135–145)

## 2020-10-09 LAB — AMMONIA: Ammonia: 38 umol/L — ABNORMAL HIGH (ref 9–35)

## 2020-10-09 MED ORDER — DARBEPOETIN ALFA 150 MCG/0.3ML IJ SOSY: 150.0000 ug | PREFILLED_SYRINGE | INTRAMUSCULAR | Status: DC

## 2020-10-09 MED ORDER — OXYCODONE HCL 5 MG PO TABS
5.0000 mg | ORAL_TABLET | Freq: Every day | ORAL | Status: DC | PRN
  Administered 2020-10-11 – 2020-10-12 (×3): 5 mg via ORAL
  Filled 2020-10-09 (×3): qty 1

## 2020-10-09 MED ORDER — INSULIN GLARGINE 100 UNIT/ML ~~LOC~~ SOLN
5.0000 [IU] | Freq: Every day | SUBCUTANEOUS | Status: DC
  Filled 2020-10-09: qty 0.05

## 2020-10-09 MED ORDER — GABAPENTIN 300 MG PO CAPS
300.0000 mg | ORAL_CAPSULE | Freq: Every day | ORAL | Status: DC
  Administered 2020-10-10 – 2020-10-11 (×2): 300 mg via ORAL
  Filled 2020-10-09 (×2): qty 1

## 2020-10-09 MED ORDER — OXYCODONE-ACETAMINOPHEN 5-325 MG PO TABS
1.0000 | ORAL_TABLET | Freq: Every day | ORAL | Status: DC | PRN
Start: 2020-10-09 — End: 2020-10-12
  Administered 2020-10-11 – 2020-10-12 (×3): 1 via ORAL
  Filled 2020-10-09 (×3): qty 1

## 2020-10-09 MED ORDER — DARBEPOETIN ALFA 150 MCG/0.3ML IJ SOSY
150.0000 ug | PREFILLED_SYRINGE | INTRAMUSCULAR | Status: DC
  Filled 2020-10-09: qty 0.3

## 2020-10-09 NOTE — Progress Notes (Addendum)
[facility] Progress Note   Subjective:  Seen in room. No CP or dyspnea today. Reports leg pain is at 8 of out 10. Per notes, after discussion with hospitalist team and Dr. Sharol Given, he has consented for B BKA - which will be done tomorrow. He is scheduled for dialysis later today. Noticed that he is having some twitching/myoclonic jerking on exam - likely med effect, see below.  Objective Vitals:   10/08/20 1817 10/08/20 2133 10/09/20 0425 10/09/20 0923  BP: (!) 154/132 (!) 126/50 125/88 112/74  Pulse: (!) 108 (!) 101 (!) 104 (!) 101  Resp: '17 18 16 18  '$ Temp: 99 F (37.2 C) 98.9 F (37.2 C) 99.1 F (37.3 C) 99 F (37.2 C)  TempSrc: Oral  Oral Oral  SpO2: 97%  100% 96%  Weight:       Physical Exam General: Chronically ill appearing man, NAD. Room air. + myoclonic jerking Heart: RRR; no murmur Lungs: CTA anteriorly Abdomen: soft, non-tender. Still with some tense/pitting edema in flanks Extremities: 3+ tense pitting edema through thighs, heels wrapped Dialysis Access: R AVF + bruit  Additional Objective Labs: Basic Metabolic Panel: Recent Labs  Lab 10/07/20 1248 10/08/20 0000  NA 132* 134*  K 5.3* 4.5  CL 94* 95*  CO2 26 21*  GLUCOSE 366* 272*  BUN 40* 44*  CREATININE 6.98* 6.99*  CALCIUM 8.3* 8.2*   Liver Function Tests: Recent Labs  Lab 10/07/20 1248  AST 13*  ALT <5  ALKPHOS 427*  BILITOT 1.1  PROT 6.4*  ALBUMIN 1.8*   CBC: Recent Labs  Lab 10/07/20 1248 10/08/20 0000  WBC 10.1 9.1  NEUTROABS 8.8*  --   HGB 9.0* 8.2*  HCT 31.6* 28.9*  MCV 100.0 101.8*  PLT 357 354   Blood Culture    Component Value Date/Time   SDES BLOOD LEFT HAND 10/07/2020 2007   SPECREQUEST  10/07/2020 2007    BOTTLES DRAWN AEROBIC AND ANAEROBIC Blood Culture adequate volume   CULT  10/07/2020 2007    NO GROWTH < 12 HOURS Performed at [facility] [address], [place], [place] [address]    REPTSTATUS PENDING 10/07/2020 2007    Studies/Results: DG Chest 1 View  Result Date: 10/07/2020 CLINICAL DATA:  Congestive heart failure. Shortness of breath. Chest pain. having surgery for a lower leg amputation tomorrow due to infections in his foot. Hx of htn. EXAM: CHEST  1 VIEW COMPARISON:  Chest x-ray 09/23/2020, CT chest 11/26/2018 FINDINGS: Enlarged cardiac silhouette. The heart size and mediastinal contours are unchanged. Bibasilar atelectasis. No focal consolidation. No pulmonary edema. No pleural effusion. No pneumothorax. No acute osseous abnormality. IMPRESSION: Bibasilar atelectasis with superimposed infection/inflammation not excluded. Electronically Signed   By: Iven Finn M.D.   On: 10/07/2020 18:48   DG Foot Complete Right  Result Date: 10/07/2020 CLINICAL DATA:  Wound on heel for 6 months, gangrene EXAM: RIGHT FOOT COMPLETE - 3+ VIEW COMPARISON:  09/24/2020 FINDINGS: Frontal, oblique, and lateral views of the right foot are obtained. There is a large soft tissue defect overlying the plantar aspect of the right calcaneus with underlying bony changes consistent with acute osteomyelitis. Interval development of extensive subcutaneous gas throughout the plantar soft tissues which could reflect interval debridement or infection with gas-forming organism. Progressive soft tissue swelling of the remainder of the right foot. There is extensive periarticular osteopenia and diffuse osteoarthritis. Extensive vascular calcifications are noted. IMPRESSION: 1. Continued plantar soft tissue defect with underlying calcaneal osteomyelitis. Interval  development of extensive subcutaneous gas could reflect interval debridement or infection with gas-forming organism. 2. Progressive soft tissue swelling of the right forefoot and midfoot. 3. Diffuse osteoarthritis and osteopenia. Electronically Signed   By: Randa Ngo M.D.   On: 10/07/2020 15:43    Medications:  albumin human 25 g (10/08/20 1515)   piperacillin-tazobactam (ZOSYN)  IV  2.25 g (10/09/20 0650)   vancomycin      (feeding supplement) PROSource Plus  30 mL Oral BID BM   aspirin  325 mg Oral Daily   atorvastatin  10 mg Oral QPM   calcium acetate  1,334 mg Oral TID WC   Chlorhexidine Gluconate Cloth  6 each Topical Q0600   cinacalcet  30 mg Oral Q breakfast   collagenase   Topical Daily   cycloSPORINE  1 drop Both Eyes Daily   darbepoetin (ARANESP) injection - DIALYSIS  100 mcg Intravenous Q Wed-HD   gabapentin  300 mg Oral TID   heparin  5,000 Units Subcutaneous Q8H   insulin aspart  0-6 Units Subcutaneous Q4H   insulin glargine  10 Units Subcutaneous Daily   midodrine  5 mg Oral Q T,Th,Sat-1800   oxyCODONE-acetaminophen  1 tablet Oral 5 X Daily   And   oxyCODONE  5 mg Oral 5 X Daily   sucroferric oxyhydroxide  500 mg Oral Q breakfast    Dialysis Orders: T[facility] at [facility] --> need to clarify 3:45hr, 250dialyzer, 450/A1.5, EDW ~ 90kg, 2K/2.5Ca, AVF, heparin 6000 + 500/hr - Aranesp 177mg IV q Thur - Zemplar 950m IV q HD   Assessment/Plan: B heel wounds/osteomyelitis: Prev recommendation was B BKA and had refused. After many discussions, now agreeable. For B BKA with Dr. DuSharol Givenn 7/22. Vol overload/Anasarca: S/p serial HD during last admit - continue process of volume correction while here. 5kg UF planned with next HD.  ESRD: Usual TTS sched - for HD later today.  Hypertension/volume: BP stable with significant anasarca. No antihypertensive meds - on mido '5mg'$  pre-HD only which can be escalated up if we run into issues with BP dropping on HD.  Anemia: Hgb 8.2 - will continue weekly Aranesp ( ^ next dose). Suspect will need post-op transfusion.  Metabolic bone disease: Ca ok, Phos pending. Continue binders (Phoslo), sensipar.  Nutrition: Alb very low - continue supplements.  Sacral wound: Noted during last admit. Per primary.  Hx finger amputations, ischemic  HFrEF (25-30%): Perhaps will improve once volume corrected  Myoclonic jerking: Noted  today on exam - on very high dose of gabapentin for HD patient ('300mg'$  TID), will reduce to '300mg'$  QHS for now.  KaVeneta PentonPA-C 10/09/2020, 10:49 AM  [facility]  I have seen and examined this patient and agree with plan and assessment in the above note with renal recommendations/intervention highlighted.  JoBroadus John Luceal Hollibaugh,MD 10/09/2020 11:28 AM

## 2020-10-09 NOTE — Care Management Important Message (Signed)
Important Message  Patient Details  Name: [name] [name] MRN: [id number] Date of Birth: [birth year]   Medicare Important Message Given:  Yes     Orbie Pyo 10/09/2020, 3:58 PM

## 2020-10-09 NOTE — Plan of Care (Signed)
?  Problem: Clinical Measurements: ?Goal: Will remain free from infection ?Outcome: Progressing ?  ?Problem: Nutrition: ?Goal: Adequate nutrition will be maintained ?Outcome: Progressing ?  ?Problem: Pain Managment: ?Goal: General experience of comfort will improve ?Outcome: Progressing ?  ?

## 2020-10-09 NOTE — Progress Notes (Signed)
Patient ID: [name] [name], male   DOB: 10/12/1965, 55 y.o.   MRN: [id number] Patient seen in follow-up for gangrenous ulcers of bilateral heels with chronic osteomyelitis of bilateral calcaneus.  Patient's mother was at bedside.  All questions were encouraged and answered.  Recommended proceeding with bilateral transtibial amputations secondary to the gangrenous heel pad and chronic osteomyelitis of both heels.  Discussed risks and benefits of surgery including persistent infection nonhealing of the wound need for additional surgery potential for mortality from surgery.  Patient and his mother state they understand and wish to proceed at this time.  Patient expressed an interest to be discharged to home with home therapy.  Discussed this will be based on his upper extremity strength to be able to independently transfer.  Discussed that if he does not have sufficient upper extremity strength he would need to go to rehab to develop this strength.

## 2020-10-09 NOTE — Progress Notes (Signed)
PROGRESS NOTE    [name] [name]  [id number] DOB: April 22, 1965 DOA: 10/07/2020 PCP: [name], MD    Brief Narrative:  55 y/o male with history of IDDM, ESRD who was recently admitted with bilateral heel osteomyelitis and necrotic ulcers. He refused b/l BKA at that time. He was readmitted with worsening bilateral foot pain. He was seen by orthopedics and podiatry who are recommending bilateral bka.    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)   Chronic osteomyelitis of foot ([name])   Cutaneous abscess of foot   Bilateral heel osteomyelitis/abscess ulceration and necrosis -was recommended to have b/l BKA during last admission but refused -patient has been receiving IV cefepime as outpatient at his dialysis center  -it appears he was also receiving doxycycline and flagyl -readmitted for worsening pain  -restarted on IV antibiotics, vancomycin and zosyn -seen by podiatry and orthopedics, both recommending b/l BKA -review of notes indicate that patient now agreeable for b/l BKA -plans for OR in AM  ESRD -TTS -seen by nephrology -underwent HD on 7/20 -plans on another HD session today  Lethargy -possibly medication related -gabapentin dose reduced -change oxycodone to prn -check ammonia  Sacral wounds -WOC consulted  Anasarca -patient is hypoalbuminemic  -nutrition to follow  IDDM -chronically on insulin pump -will hold for now and start on lantus/SSI -blood sugars currently stable  Anemia of chronic disease -likely related to renal disease -his chronic foot infections likely also contributing -continue to follow  Chronic systolic CHF -EF [id number] -volume management with dialysis -suspect that hypoalbuminemia largely contributing to anasarca     DVT prophylaxis: heparin injection 5,000 Units Start: 10/07/20 2200  Code Status: full code Family Communication: discussed  with patient Disposition Plan: Status is: Inpatient  Remains inpatient appropriate because:Ongoing active pain requiring inpatient pain management, Ongoing diagnostic testing needed not appropriate for outpatient work up, IV treatments appropriate due to intensity of illness or inability to take PO, and Inpatient level of care appropriate due to severity of illness  Dispo: The patient is from: Home              Anticipated d/c is to:  TBD              Patient currently is medically stable to d/c.   Difficult to place patient No         Consultants:  Orthopedics, Dr. Sharol Given Nephrology Podiatry, Dr. Jacqualyn Posey  Procedures:    Antimicrobials:  Vancomycin 7/20> Zosyn 7/20>    Subjective: Sleeping on my arrival, but wakes up to voice. He does answer some questions but frequently falls asleep in conversation. Complains of continued pain in his feet  Objective: Vitals:   10/08/20 1817 10/08/20 2133 10/09/20 0425 10/09/20 0923  BP: (!) 154/132 (!) 126/50 125/88 112/74  Pulse: (!) 108 (!) 101 (!) 104 (!) 101  Resp: '17 18 16 18  '$ Temp: 99 F (37.2 C) 98.9 F (37.2 C) 99.1 F (37.3 C) 99 F (37.2 C)  TempSrc: Oral  Oral Oral  SpO2: 97%  100% 96%  Weight:        Intake/Output Summary (Last 24 hours) at 10/09/2020 1552 Last data filed at 10/09/2020 1525 Gross per 24 hour  Intake 988.12 ml  Output 0 ml  Net 988.12 ml   Filed Weights   10/08/20 0730 10/08/20 1148  Weight: 95.8 kg 91 kg    Examination:  General exam:  Appears calm and comfortable  Respiratory system: Clear to auscultation. Respiratory effort normal. Cardiovascular system: S1 & S2 heard, RRR. No JVD, murmurs, rubs, gallops or clicks. No pedal edema. Gastrointestinal system: Abdomen is nondistended, soft and nontender. No organomegaly or masses felt. Normal bowel sounds heard. Central nervous system: No focal neurological deficits. Extremities: bilateral necrotic ulcers on heels Skin: sacral wounds   Psychiatry: somnolent     Data Reviewed: I have personally reviewed following labs and imaging studies  CBC: Recent Labs  Lab 10/07/20 1248 10/08/20 0000  WBC 10.1 9.1  NEUTROABS 8.8*  --   HGB 9.0* 8.2*  HCT 31.6* 28.9*  MCV 100.0 101.8*  PLT 357 [id number]   Basic Metabolic Panel: Recent Labs  Lab 10/07/20 1248 10/08/20 0000 10/09/20 1322  NA 132* 134* 133*  K 5.3* 4.5 4.1  CL 94* 95* 95*  CO2 26 21* 24  GLUCOSE 366* 272* 118*  BUN 40* 44* 30*  CREATININE 6.98* 6.99* 5.31*  CALCIUM 8.3* 8.2* 8.7*   GFR: Estimated Creatinine Clearance: 16.5 mL/min (A) (by C-G formula based on SCr of 5.31 mg/dL (H)). Liver Function Tests: Recent Labs  Lab 10/07/20 1248  AST 13*  ALT <5  ALKPHOS 427*  BILITOT 1.1  PROT 6.4*  ALBUMIN 1.8*   No results for input(s): LIPASE, AMYLASE in the last 168 hours. No results for input(s): AMMONIA in the last 168 hours. Coagulation Profile: Recent Labs  Lab 10/07/20 1248  INR 1.3*   Cardiac Enzymes: No results for input(s): CKTOTAL, CKMB, CKMBINDEX, TROPONINI in the last 168 hours. BNP (last 3 results) No results for input(s): PROBNP in the last 8760 hours. HbA1C: No results for input(s): HGBA1C in the last 72 hours. CBG: Recent Labs  Lab 10/07/20 1208 10/08/20 0155 10/08/20 0641  GLUCAP 356* 250* 224*   Lipid Profile: No results for input(s): CHOL, HDL, LDLCALC, TRIG, CHOLHDL, LDLDIRECT in the last 72 hours. Thyroid Function Tests: No results for input(s): TSH, T4TOTAL, FREET4, T3FREE, THYROIDAB in the last 72 hours. Anemia Panel: Recent Labs    10/07/20 1900 10/08/20 0404  TIBC 118*  --   IRON 43*  --   RETICCTPCT  --  1.2   Sepsis Labs: Recent Labs  Lab 10/07/20 1248 10/07/20 2033  LATICACIDVEN 2.2* 2.6*    Recent Results (from the past 240 hour(s))  Culture, blood (Routine x 2)     Status: None (Preliminary result)   Collection Time: 10/07/20  1:06 PM   Specimen: BLOOD  Result Value Ref Range Status    Specimen Description BLOOD LEFT ANTECUBITAL  Final   Special Requests   Final    BOTTLES DRAWN AEROBIC AND ANAEROBIC Blood Culture results may not be optimal due to an inadequate volume of blood received in culture bottles   Culture   Final    NO GROWTH < 24 HOURS Performed at [facility], [place] [address] [place], [place] [address]    Report Status PENDING  Incomplete  SARS CORONAVIRUS 2 (TAT 6-24 HRS) Nasopharyngeal Nasopharyngeal Swab     Status: None   Collection Time: 10/07/20  4:39 PM   Specimen: Nasopharyngeal Swab  Result Value Ref Range Status   SARS Coronavirus 2 NEGATIVE NEGATIVE Final    Comment: (NOTE) SARS-CoV-2 target nucleic acids are NOT DETECTED.  The SARS-CoV-2 RNA is generally detectable in upper and lower respiratory specimens during the acute phase of infection. Negative results do not preclude SARS-CoV-2 infection, do not rule out co-infections with other  pathogens, and should not be used as the sole basis for treatment or other patient management decisions. Negative results must be combined with clinical observations, patient history, and epidemiological information. The expected result is Negative.  Fact Sheet for Patients: SugarRoll.be  Fact Sheet for Healthcare Providers: https://www.woods-mathews.com/  This test is not yet approved or cleared by the [place] FDA and  has been authorized for detection and/or diagnosis of SARS-CoV-2 by FDA under an Emergency Use Authorization (EUA). This EUA will remain  in effect (meaning this test can be used) for the duration of the COVID-19 declaration under Se ction 564(b)(1) of the Act, 21 U.S.C. section 360bbb-3(b)(1), unless the authorization is terminated or revoked sooner.  Performed at [facility], [place] [address], [place], [place] [address]   Culture, blood (Routine x 2)     Status: None (Preliminary result)   Collection Time: 10/07/20  8:07 PM    Specimen: BLOOD LEFT HAND  Result Value Ref Range Status   Specimen Description BLOOD LEFT HAND  Final   Special Requests   Final    BOTTLES DRAWN AEROBIC AND ANAEROBIC Blood Culture adequate volume   Culture   Final    NO GROWTH < 12 HOURS Performed at [facility], [place] [address] [place], [place] [address]    Report Status PENDING  Incomplete         Radiology Studies: DG Chest 1 View  Result Date: 10/07/2020 CLINICAL DATA:  Congestive heart failure. Shortness of breath. Chest pain. having surgery for a lower leg amputation tomorrow due to infections in his foot. Hx of htn. EXAM: CHEST  1 VIEW COMPARISON:  Chest x-ray 09/23/2020, CT chest 11/26/2018 FINDINGS: Enlarged cardiac silhouette. The heart size and mediastinal contours are unchanged. Bibasilar atelectasis. No focal consolidation. No pulmonary edema. No pleural effusion. No pneumothorax. No acute osseous abnormality. IMPRESSION: Bibasilar atelectasis with superimposed infection/inflammation not excluded. Electronically Signed   By: Iven Finn M.D.   On: 10/07/2020 18:48        Scheduled Meds:  (feeding supplement) PROSource Plus  30 mL Oral BID BM   aspirin  325 mg Oral Daily   atorvastatin  10 mg Oral QPM   calcium acetate  1,334 mg Oral TID WC   Chlorhexidine Gluconate Cloth  6 each Topical Q0600   cinacalcet  30 mg Oral Q breakfast   collagenase   Topical Daily   cycloSPORINE  1 drop Both Eyes Daily   [START ON 10/16/2020] darbepoetin (ARANESP) injection - DIALYSIS  150 mcg Intravenous Q Thu-HD   [START ON 10/10/2020] gabapentin  300 mg Oral QHS   heparin  5,000 Units Subcutaneous Q8H   insulin aspart  0-6 Units Subcutaneous Q4H   [START ON 10/10/2020] insulin glargine  5 Units Subcutaneous Daily   midodrine  5 mg Oral Q T,Th,Sat-1800   sucroferric oxyhydroxide  500 mg Oral Q breakfast   Continuous Infusions:  albumin human 25 g (10/08/20 1515)   piperacillin-tazobactam (ZOSYN)  IV Stopped (10/09/20  1416)   vancomycin       LOS: 2 days    Time spent: 37mns    JKathie Dike MD [facility]   If 7PM-7AM, please contact night-coverage [email or web address]  10/09/2020, 3:52 PM

## 2020-10-09 NOTE — Progress Notes (Signed)
MALCOLM, QUAST ([id number]) Visit Report for 09/15/2020 Arrival Information Details Patient Name: Date of Service: [place], [place] 09/15/2020 1:15 PM Medical Record Number: [id number] Patient Account Number: [id number] Date of Birth/Sex: Treating RN: 29-Oct-1965 (55 y.o. Hessie Diener Primary Care Bevelyn Arriola: [name] Other Clinician: Referring Paul Torpey: Treating [place] Freshour/Extender: [name] in Treatment: 10 Visit Information History Since Last Visit All ordered tests and consults were completed: No Patient Arrived: Wheel Chair Added or deleted any medications: No Arrival Time: 13:16 Any new allergies or adverse reactions: No Accompanied By: spouse Had a fall or experienced change in No Transfer Assistance: EasyPivot Patient Lift activities of daily living that may affect Patient Identification Verified: Yes risk of falls: Secondary Verification Process Completed: Yes Signs or symptoms of abuse/neglect since last visito No Patient Requires Transmission-Based Precautions: No Hospitalized since last visit: No Patient Has Alerts: Yes Implantable device outside of the clinic excluding No Patient Alerts: R TBI: 0.56 cellular tissue based products placed in the center L TBI: 0.75 since last visit: Has Dressing in Place as Prescribed: Yes Pain Present Now: No Electronic Signature(s) Signed: 10/08/2020 8:19:15 PM By: Deon Pilling Previous Signature: 10/06/2020 9:00:33 PM Version By: Deon Pilling Entered By: Deon Pilling on 10/08/2020 20:12:40 -------------------------------------------------------------------------------- Encounter Discharge Information Details Patient Name: Date of Service: [name], [place] L. 09/15/2020 1:15 PM Medical Record Number: [id number] Patient Account Number: [id number] Date of Birth/Sex: Treating RN: 12/15/1965 (55 y.o. [name] Primary Care Madalynn Pickelsimer: [name] Other Clinician: Referring  Marrisa Kimber: Treating Krishika Bugge/Extender: [name] in Treatment: 10 Encounter Discharge Information Items Post Procedure Vitals Discharge Condition: Stable Temperature (F): 98.9 Ambulatory Status: Wheelchair Pulse (bpm): 111 Discharge Destination: Home Respiratory Rate (breaths/min): 20 Transportation: Private Auto Blood Pressure (mmHg): 97/50 Accompanied By: mother Schedule Follow-up Appointment: Yes Clinical Summary of Care: Patient Declined Electronic Signature(s) Signed: 09/16/2020 5:39:04 PM By: Levan Hurst RN, BSN Entered By: Levan Hurst on 09/15/2020 15:30:14 -------------------------------------------------------------------------------- Lower Extremity Assessment Details Patient Name: Date of Service: [name], [place] [name] 09/15/2020 1:15 PM Medical Record Number: [id number] Patient Account Number: [id number] Date of Birth/Sex: Treating RN: Nov 10, 1965 (55 y.o. Hessie Diener Primary Care Amela Handley: [name] Other Clinician: Referring Hammad Finkler: Treating Ivylynn Hoppes/Extender: [name] in Treatment: 10 Edema Assessment Assessed: Shirlyn Goltz: No] Patrice Paradise: No] Edema: [Left: Yes] [Right: Yes] Calf Left: Right: Point of Measurement: 30 cm From Medial Instep 49.5 cm 48.5 cm Ankle Left: Right: Point of Measurement: 10 cm From Medial Instep 26.6 cm 25.5 cm Vascular Assessment Pulses: Dorsalis Pedis Palpable: [Left:Yes] [Right:Yes] Electronic Signature(s) Signed: 10/08/2020 8:19:15 PM By: Deon Pilling Previous Signature: 10/06/2020 9:00:33 PM Version By: Deon Pilling Entered By: Deon Pilling on 10/08/2020 20:12:59 -------------------------------------------------------------------------------- Multi Wound Chart Details Patient Name: Date of Service: [name], [place] [name] 09/15/2020 1:15 PM Medical Record Number: [id number] Patient Account Number: [id number] Date of Birth/Sex: Treating RN: 31-Aug-1965  (55 y.o. [name] Primary Care Kaityln Kallstrom: [name] Other Clinician: Referring Sheddrick Lattanzio: Treating Conner Muegge/Extender: [name] in Treatment: 10 Vital Signs Height(in): 65 Pulse(bpm): 111 Weight(lbs): 240 Blood Pressure(mmHg): 97/50 Body Mass Index(BMI): 40 Temperature(F): 98.9 Respiratory Rate(breaths/min): 20 Photos: [2:No Photos Right Calcaneus] [3:No Photos Left Calcaneus] [5:No Photos Left Gluteal fold] Wound Location: [2:Pressure Injury] [3:Pressure Injury] [5:Shear/Friction] Wounding Event: [2:Diabetic Wound/Ulcer of the Lower] [3:Diabetic Wound/Ulcer of the Lower] [5:Pressure Ulcer] Primary Etiology: [2:Extremity Anemia, Congestive Heart Failure,] [3:Extremity Anemia, Congestive Heart Failure,] [5:Anemia, Congestive Heart Failure,] Comorbid History: [2:Hypertension,  Type II Diabetes, End Stage Renal Disease, Raynauds, Neuropathy 04/22/2020] [3:Hypertension, Type II Diabetes, End Stage Renal Disease, Raynauds, Neuropathy 04/22/2020] [5:Hypertension, Type II Diabetes, End Stage Renal  Disease, Raynauds, Neuropathy 08/22/2020] Date Acquired: [2:10] [3:10] [5:3] Weeks of Treatment: [2:Open] [3:Open] [5:Open] Wound Status: [2:No] [3:No] [5:Yes] Clustered Wound: [2:8x7.1x1] [3:7.3x7x0.8] [5:0.3x0.3x0.1] Measurements L x W x D (cm) [2:44.611] [3:40.134] [5:0.071] A (cm) : rea [2:44.611] [3:32.107] [5:0.007] Volume (cm) : [2:-320.70%] [3:-111.20%] [5:97.70%] % Reduction in A rea: [2:-2003.30%] [3:-87.70%] [5:97.80%] % Reduction in Volume: [2:Grade 2] [3:Grade 2] [5:Category/Stage II] Classification: [2:Large] [3:Large] [5:Medium] Exudate A mount: [2:Purulent] [3:Serosanguineous] [5:Serosanguineous] Exudate Type: [2:yellow, brown, green] [3:red, brown] [5:red, brown] Exudate Color: [2:Yes] [3:Yes] [5:No] Foul Odor A Cleansing: [2:fter No] [3:No] [5:N/A] Odor A nticipated Due to Product Use: [2:Well defined, not attached] [3:Well  defined, not attached] [5:Distinct, outline attached] Wound Margin: [2:Small (1-33%)] [3:None Present (0%)] [5:Large (67-100%)] Granulation A mount: [2:Pink] [3:N/A] [5:Red, Pink] Granulation Quality: [2:Large (67-100%)] [3:Medium (34-66%)] [5:None Present (0%)] Necrotic A mount: [2:Eschar, Adherent Slough] [3:Eschar, Adherent Slough] [5:N/A] Necrotic Tissue: [2:Fat Layer (Subcutaneous Tissue): Yes Fat Layer (Subcutaneous Tissue): Yes Fat Layer (Subcutaneous Tissue): Yes] Exposed Structures: [2:Bone: Yes Fascia: No Tendon: No Muscle: No Joint: No None] [3:Fascia: No Tendon: No Muscle: No Joint: No Bone: No Small (1-33%)] [5:Fascia: No Tendon: No Muscle: No Joint: No Bone: No Large (67-100%)] Epithelialization: [2:Debridement - Excisional] [3:Debridement - Excisional] [5:N/A] Debridement: Pre-procedure Verification/Time Out 14:37 [3:14:37] [5:N/A] Taken: [2:Lidocaine 5% topical ointment] [3:Lidocaine 5% topical ointment] [5:N/A] Pain Control: [2:Necrotic/Eschar, Subcutaneous,] [3:Necrotic/Eschar, Subcutaneous,] [5:N/A] Tissue Debrided: [2:Slough Skin/Subcutaneous Tissue] [3:Slough Skin/Subcutaneous Tissue] [5:N/A] Level: [2:56.8] [3:51.1] [5:N/A] Debridement A (sq cm): [2:rea Blade, Curette, Forceps, Scissors] [3:Blade, Curette, Forceps, Scissors] [5:N/A] Instrument: [2:Minimum] [3:Minimum] [5:N/A] Bleeding: [2:Pressure] [3:Pressure] [5:N/A] Hemostasis Achieved: Debridement Treatment Response: Procedure was tolerated well [3:Procedure was tolerated well] [5:N/A] Post Debridement Measurements L x 8x7.1x1 [3:7.3x7x0.8] [5:N/A] W x D (cm) [2:44.611] [3:32.107] [5:N/A] Post Debridement Volume: (cm) [2:Debridement] [3:Debridement] [5:N/A] Wound Number: _0 Photos: No Photos No Photos No Photos Sacrum Left Gluteus Left, Distal Back Wound Location: Gradually Appeared Gradually Appeared Pressure Injury Wounding Event: Pressure Ulcer T be determined o Pressure Ulcer Primary  Etiology: Anemia, Congestive Heart Failure, Anemia, Congestive Heart Failure, Anemia, Congestive Heart Failure, Comorbid History: Hypertension, Type II Diabetes, End Hypertension, Type II Diabetes, End Hypertension, Type II Diabetes, End Stage Renal Disease, Raynauds, Stage Renal Disease, Raynauds, Stage Renal Disease, Raynauds, Neuropathy Neuropathy Neuropathy 09/15/2020 09/15/2020 09/15/2020 Date Acquired: 0 0 0 Weeks of Treatment: Open Open Open Wound Status: No No No Clustered Wound: 1x0.4x0.1 0.6x0.7x0.1 1.1x1.1x0.1 Measurements L x W x D (cm) 0.314 0.33 0.95 A (cm) : rea 0.031 0.033 0.095 Volume (cm) : N/A N/A N/A % Reduction in Area: N/A N/A N/A % Reduction in Volume: Category/Stage II Full Thickness Without Exposed Unstageable/Unclassified Classification: Support Structures Medium Medium Medium Exudate A mount: Serosanguineous Serosanguineous Serosanguineous Exudate Type: red, brown red, brown red, brown Exudate Color: No No No Foul Odor A Cleansing: fter N/A N/A N/A Odor Anticipated Due to Product Use: Indistinct, nonvisible Distinct, outline attached Distinct, outline attached Wound Margin: Small (1-33%) None Present (0%) None Present (0%) Granulation A mount: Red, Pink N/A N/A Granulation Quality: Small (1-33%) Large (67-100%) Large (67-100%) Necrotic Amount: Adherent Slough Adherent Slough N/A Necrotic Tissue: Fat Layer (Subcutaneous Tissue): Yes Fat Layer (Subcutaneous Tissue): Yes Fat Layer (Subcutaneous Tissue): Yes Exposed Structures: Fascia: No Fascia: No Tendon: No Tendon: No Muscle: No Muscle: No Joint: No Joint: No  Bone: No Bone: No None None None Epithelialization: N/A N/A N/A Debridement: N/A N/A N/A Pain Control: N/A N/A N/A Tissue Debrided: N/A N/A N/A Level: N/A N/A N/A Debridement A (sq cm): rea N/A N/A N/A Instrument: N/A N/A N/A Bleeding: N/A N/A N/A Hemostasis Achieved: N/A N/A N/A Debridement Treatment  Response: N/A N/A N/A Post Debridement Measurements L x W x D (cm) N/A N/A N/A Post Debridement Volume: (cm) N/A N/A N/A Procedures Performed: Treatment Notes Electronic Signature(s) Signed: 09/15/2020 3:14:33 PM By: Kalman Shan DO Signed: 09/15/2020 5:08:20 PM By: Lorrin Jackson Entered By: Kalman Shan on 09/15/2020 15:04:31 -------------------------------------------------------------------------------- Multi-Disciplinary Care Plan Details Patient Name: Date of Service: [name], [place] [name] 09/15/2020 1:15 PM Medical Record Number: [id number] Patient Account Number: [id number] Date of Birth/Sex: Treating RN: July 04, 1965 (55 y.o. [name] Primary Care Delvis Kau: [name] Other Clinician: Referring Kayleena Eke: Treating Beckett Hickmon/Extender: [name] in Treatment: 10 Multidisciplinary Care Plan reviewed with physician Active Inactive Abuse / Safety / Falls / Self Care Management Nursing Diagnoses: Potential for falls Potential for injury related to falls Goals: Patient will not experience any injury related to falls Date Initiated: 07/01/2020 Target Resolution Date: 10/03/2020 Goal Status: Active Patient/caregiver will verbalize/demonstrate measures taken to prevent injury and/or falls Date Initiated: 07/01/2020 Date Inactivated: 08/11/2020 Target Resolution Date: 08/08/2020 Goal Status: Met Interventions: Assess Activities of Daily Living upon admission and as needed Assess fall risk on admission and as needed Assess: immobility, friction, shearing, incontinence upon admission and as needed Assess impairment of mobility on admission and as needed per policy Assess personal safety and home safety (as indicated) on admission and as needed Provide education on fall prevention Provide education on personal and home safety Notes: Nutrition Nursing Diagnoses: Impaired glucose control: actual or potential Potential for  alteratiion in Nutrition/Potential for imbalanced nutrition Goals: Patient/caregiver agrees to and verbalizes understanding of need to use nutritional supplements and/or vitamins as prescribed Date Initiated: 07/01/2020 Date Inactivated: 08/11/2020 Target Resolution Date: 08/08/2020 Goal Status: Met Patient/caregiver will maintain therapeutic glucose control Date Initiated: 07/01/2020 Target Resolution Date: 10/03/2020 Goal Status: Active Interventions: Assess HgA1c results as ordered upon admission and as needed Assess patient nutrition upon admission and as needed per policy Provide education on elevated blood sugars and impact on wound healing Provide education on nutrition Treatment Activities: Education provided on Nutrition : 07/01/2020 Notes: Wound/Skin Impairment Nursing Diagnoses: Impaired tissue integrity Knowledge deficit related to ulceration/compromised skin integrity Goals: Patient/caregiver will verbalize understanding of skin care regimen Date Initiated: 07/01/2020 Target Resolution Date: 10/03/2020 Goal Status: Active Ulcer/skin breakdown will have a volume reduction of 30% by week 4 Date Initiated: 07/01/2020 Date Inactivated: 08/11/2020 Target Resolution Date: 08/08/2020 Unmet Reason: eschar requiring Goal Status: Unmet multiple debridements Ulcer/skin breakdown will have a volume reduction of 50% by week 8 Date Initiated: 08/11/2020 Date Inactivated: 09/08/2020 Target Resolution Date: 09/05/2020 Goal Status: Unmet Unmet Reason: increased bioburden Ulcer/skin breakdown will have a volume reduction of 80% by week 12 Date Initiated: 09/08/2020 Target Resolution Date: 10/03/2020 Goal Status: Active Interventions: Assess patient/caregiver ability to obtain necessary supplies Assess patient/caregiver ability to perform ulcer/skin care regimen upon admission and as needed Assess ulceration(s) every visit Provide education on ulcer and skin care Notes: Electronic  Signature(s) Signed: 09/15/2020 1:40:21 PM By: Lorrin Jackson Entered By: Lorrin Jackson on 09/15/2020 13:40:20 -------------------------------------------------------------------------------- Pain Assessment Details Patient Name: Date of Service: [name], [place] Fil[place] L. 09/15/2020 1:15 PM Medical Record Number: [id number] Patient Account Number: [id number] Date of Birth/Sex: Treating  RN: December 08, 1965 (55 y.o. Hessie Diener Primary Care Yuma Blucher: [name] Other Clinician: Referring Aloura Matsuoka: Treating Breuna Loveall/Extender: [name] in Treatment: 10 Active Problems Location of Pain Severity and Description of Pain Patient Has Paino Yes Site Locations Pain Location: Pain Location: Pain in Ulcers With Dressing Change: Yes Duration of the Pain. Constant / Intermittento Constant Rate the pain. Current Pain Level: 9 Worst Pain Level: 10 Least Pain Level: 5 Tolerable Pain Level: 6 Character of Pain Describe the Pain: Aching, Heavy Pain Management and Medication Current Pain Management: Medication: No Cold Application: No Rest: No Massage: No Activity: No T.E.N.S.: No Heat Application: No Leg drop or elevation: No Is the Current Pain Management Adequate: Inadequate How does your wound impact your activities of daily livingo Sleep: Yes Bathing: No Appetite: No Relationship With Others: No Bladder Continence: No Emotions: Yes Bowel Continence: No Work: No Toileting: No Drive: No Dressing: No Hobbies: No Electronic Signature(s) Signed: 10/08/2020 8:19:15 PM By: Deon Pilling Previous Signature: 10/06/2020 9:00:33 PM Version By: Deon Pilling Entered By: Deon Pilling on 10/08/2020 20:12:53 -------------------------------------------------------------------------------- Patient/Caregiver Education Details Patient Name: Date of Service: [name], MA RK Carlean Jews 6/27/2022andnbsp1:15 PM Medical Record Number: [id number] Patient Account  Number: [id number] Date of Birth/Gender: Treating RN: 16-Jan-1966 (55 y.o. [name] Primary Care Physician: [name] Other Clinician: Referring Physician: Treating Physician/Extender: [name] in Treatment: 10 Education Assessment Education Provided To: Patient Education Topics Provided Elevated Blood Sugar/ Impact on Healing: Methods: Explain/Verbal, Printed Responses: State content correctly Wound/Skin Impairment: Methods: Demonstration, Explain/Verbal, Printed Responses: State content correctly Electronic Signature(s) Signed: 09/15/2020 5:08:20 PM By: Lorrin Jackson Entered By: Lorrin Jackson on 09/15/2020 13:40:47 -------------------------------------------------------------------------------- Wound Assessment Details Patient Name: Date of Service: [name], [place] L. 09/15/2020 1:15 PM Medical Record Number: [id number] Patient Account Number: [id number] Date of Birth/Sex: Treating RN: 27-Jan-1966 (55 y.o. Lorette Ang, Meta.Reding Primary Care Britainy Kozub: [name] Other Clinician: Referring Leocadio Heal: Treating Cristle Jared/Extender: [name] in Treatment: 10 Wound Status Wound Number: 2 Primary Diabetic Wound/Ulcer of the Lower Extremity Etiology: Wound Location: Right Calcaneus Wound Open Wounding Event: Pressure Injury Status: Date Acquired: 04/22/2020 Comorbid Anemia, Congestive Heart Failure, Hypertension, Type II Diabetes, Weeks Of Treatment: 10 History: End Stage Renal Disease, Raynauds, Neuropathy Clustered Wound: No Photos Wound Measurements Length: (cm) 8 Width: (cm) 7.1 Depth: (cm) 1 Area: (cm) 44.611 Volume: (cm) 44.611 % Reduction in Area: -320.7% % Reduction in Volume: -2003.3% Epithelialization: None Tunneling: No Undermining: No Wound Description Classification: Grade 2 Wound Margin: Well defined, not attached Exudate Amount: Large Exudate Type:  Purulent Exudate Color: yellow, brown, green Foul Odor After Cleansing: Yes Due to Product Use: No Slough/Fibrino Yes Wound Bed Granulation Amount: Small (1-33%) Exposed Structure Granulation Quality: Pink Fascia Exposed: No Necrotic Amount: Large (67-100%) Fat Layer (Subcutaneous Tissue) Exposed: Yes Necrotic Quality: Eschar, Adherent Slough Tendon Exposed: No Muscle Exposed: No Joint Exposed: No Bone Exposed: Yes Treatment Notes Wound #2 (Calcaneus) Wound Laterality: Right Cleanser Normal Saline Discharge Instruction: Cleanse the wound with Normal Saline prior to applying a clean dressing using gauze sponges, not tissue or cotton balls. Wound Cleanser Discharge Instruction: Cleanse the wound with wound cleanser or normal saline prior to applying a clean dressing using gauze sponges, not tissue or cotton balls. Peri-Wound Care Topical Primary Dressing Santyl Ointment Discharge Instruction: Apply nickel thick amount to wound bed as instructed Secondary Dressing Woven Gauze Sponge, Non-Sterile 4x4 in Discharge Instruction: Apply over primary dressing as directed. ABD Pad,  8x10 Discharge Instruction: Apply over primary dressing as directed. Secured With [employer], 4.5x3.1 (in/yd) Discharge Instruction: Secure with Kerlix as directed. Paper Tape, 2x10 (in/yd) Discharge Instruction: Secure dressing with tape as directed. Compression Wrap Compression Stockings Add-Ons Electronic Signature(s) Signed: 10/08/2020 8:19:15 PM By: Deon Pilling Previous Signature: 09/15/2020 4:13:19 PM Version By: Sandre Kitty Previous Signature: 10/06/2020 9:00:33 PM Version By: Deon Pilling Entered By: Deon Pilling on 10/08/2020 20:13:11 -------------------------------------------------------------------------------- Wound Assessment Details Patient Name: Date of Service: [name], MA [name] 09/15/2020 1:15 PM Medical Record Number: [id number] Patient Account Number:  [id number] Date of Birth/Sex: Treating RN: 04/25/1965 (55 y.o. Lorette Ang, Meta.Reding Primary Care Jaritza Duignan: [name] Other Clinician: Referring Bayler Nehring: Treating Illyria Sobocinski/Extender: [name] in Treatment: 10 Wound Status Wound Number: 3 Primary Diabetic Wound/Ulcer of the Lower Extremity Etiology: Wound Location: Left Calcaneus Wound Open Wounding Event: Pressure Injury Status: Date Acquired: 04/22/2020 Comorbid Anemia, Congestive Heart Failure, Hypertension, Type II Diabetes, Weeks Of Treatment: 10 History: End Stage Renal Disease, Raynauds, Neuropathy Clustered Wound: No Photos Wound Measurements Length: (cm) 7.3 Width: (cm) 7 Depth: (cm) 0.8 Area: (cm) 40.134 Volume: (cm) 32.107 % Reduction in Area: -111.2% % Reduction in Volume: -87.7% Epithelialization: Small (1-33%) Tunneling: No Undermining: No Wound Description Classification: Grade 2 Wound Margin: Well defined, not attached Exudate Amount: Large Exudate Type: Serosanguineous Exudate Color: red, brown Foul Odor After Cleansing: Yes Due to Product Use: No Slough/Fibrino Yes Wound Bed Granulation Amount: None Present (0%) Exposed Structure Necrotic Amount: Medium (34-66%) Fascia Exposed: No Necrotic Quality: Eschar, Adherent Slough Fat Layer (Subcutaneous Tissue) Exposed: Yes Tendon Exposed: No Muscle Exposed: No Joint Exposed: No Bone Exposed: No Treatment Notes Wound #3 (Calcaneus) Wound Laterality: Left Cleanser Normal Saline Discharge Instruction: Cleanse the wound with Normal Saline prior to applying a clean dressing using gauze sponges, not tissue or cotton balls. Wound Cleanser Discharge Instruction: Cleanse the wound with wound cleanser or normal saline prior to applying a clean dressing using gauze sponges, not tissue or cotton balls. Peri-Wound Care Topical Primary Dressing Santyl Ointment Discharge Instruction: Apply nickel thick amount to wound bed  as instructed Secondary Dressing Woven Gauze Sponge, Non-Sterile 4x4 in Discharge Instruction: Apply over primary dressing as directed. ABD Pad, 8x10 Discharge Instruction: Apply over primary dressing as directed. Secured With [employer], 4.5x3.1 (in/yd) Discharge Instruction: Secure with Kerlix as directed. Paper Tape, 2x10 (in/yd) Discharge Instruction: Secure dressing with tape as directed. Compression Wrap Compression Stockings Add-Ons Electronic Signature(s) Signed: 10/08/2020 8:19:15 PM By: Deon Pilling Previous Signature: 09/15/2020 4:13:19 PM Version By: Sandre Kitty Previous Signature: 10/06/2020 9:00:33 PM Version By: Deon Pilling Entered By: Deon Pilling on 10/08/2020 20:13:40 -------------------------------------------------------------------------------- Wound Assessment Details Patient Name: Date of Service: [name], MA [name] 09/15/2020 1:15 PM Medical Record Number: [id number] Patient Account Number: [id number] Date of Birth/Sex: Treating RN: [birth year] (55 y.o. Lorette Ang, Meta.Reding Primary Care Emely Fahy: [name] Other Clinician: Referring Zahari Xiang: Treating Josemiguel Gries/Extender: [name] in Treatment: 10 Wound Status Wound Number: 5 Primary Pressure Ulcer Etiology: Wound Location: Left Gluteal fold Wound Open Wounding Event: Shear/Friction Status: Date Acquired: 08/22/2020 Comorbid Anemia, Congestive Heart Failure, Hypertension, Type II Diabetes, Weeks Of Treatment: 3 History: End Stage Renal Disease, Raynauds, Neuropathy Clustered Wound: Yes Photos Wound Measurements Length: (cm) 0.3 Width: (cm) 0.3 Depth: (cm) 0.1 Area: (cm) 0.071 Volume: (cm) 0.007 % Reduction in Area: 97.7% % Reduction in Volume: 97.8% Epithelialization: Large (67-100%) Tunneling: No Undermining: No Wound Description Classification: Category/Stage II Wound  Margin: Distinct, outline attached Exudate Amount: Medium Exudate  Type: Serosanguineous Exudate Color: red, brown Foul Odor After Cleansing: No Slough/Fibrino No Wound Bed Granulation Amount: Large (67-100%) Exposed Structure Granulation Quality: Red, Pink Fascia Exposed: No Necrotic Amount: None Present (0%) Fat Layer (Subcutaneous Tissue) Exposed: Yes Tendon Exposed: No Muscle Exposed: No Joint Exposed: No Bone Exposed: No Treatment Notes Wound #5 (Gluteal fold) Wound Laterality: Left Cleanser Soap and Water Discharge Instruction: May shower and wash wound with dial antibacterial soap and water prior to dressing change. Peri-Wound Care Topical Antibiotic Ointment Primary Dressing Secondary Dressing Bordered Gauze, 2x3.75 in Discharge Instruction: Apply over primary dressing as directed. Secured With Compression Wrap Compression Stockings Environmental education officer) Signed: 10/08/2020 8:19:15 PM By: Deon Pilling Previous Signature: 09/15/2020 4:13:19 PM Version By: Sandre Kitty Previous Signature: 10/06/2020 9:00:33 PM Version By: Deon Pilling Entered By: Deon Pilling on 10/08/2020 20:17:29 -------------------------------------------------------------------------------- Wound Assessment Details Patient Name: Date of Service: [name], MA [place] L. 09/15/2020 1:15 PM Medical Record Number: [id number] Patient Account Number: [id number] Date of Birth/Sex: Treating RN: [birth year] (55 y.o. Lorette Ang, Meta.Reding Primary Care Paitlyn Mcclatchey: [name] Other Clinician: Referring Dorinda Stehr: Treating Murl Zogg/Extender: [name] in Treatment: 10 Wound Status Wound Number: 6 Primary Pressure Ulcer Etiology: Wound Location: Sacrum Wound Open Wounding Event: Gradually Appeared Status: Date Acquired: 09/15/2020 Comorbid Anemia, Congestive Heart Failure, Hypertension, Type II Diabetes, Weeks Of Treatment: 0 History: End Stage Renal Disease, Raynauds, Neuropathy Clustered Wound: No Photos Wound  Measurements Length: (cm) 1 Width: (cm) 0.4 Depth: (cm) 0.1 Area: (cm) 0.314 Volume: (cm) 0.031 % Reduction in Area: 0% % Reduction in Volume: 0% Epithelialization: None Tunneling: No Undermining: No Wound Description Classification: Category/Stage II Wound Margin: Indistinct, nonvisible Exudate Amount: Medium Exudate Type: Serosanguineous Exudate Color: red, brown Foul Odor After Cleansing: No Slough/Fibrino No Wound Bed Granulation Amount: Small (1-33%) Exposed Structure Granulation Quality: Red, Pink Fascia Exposed: No Necrotic Amount: Small (1-33%) Fat Layer (Subcutaneous Tissue) Exposed: Yes Necrotic Quality: Adherent Slough Tendon Exposed: No Muscle Exposed: No Joint Exposed: No Bone Exposed: No Treatment Notes Wound #6 (Sacrum) Cleanser Soap and Water Discharge Instruction: May shower and wash wound with dial antibacterial soap and water prior to dressing change. Peri-Wound Care Topical Antibiotic Ointment Primary Dressing Secondary Dressing Bordered Gauze, 2x3.75 in Discharge Instruction: Apply over primary dressing as directed. Secured With Compression Wrap Compression Stockings Environmental education officer) Signed: 10/08/2020 8:19:15 PM By: Deon Pilling Previous Signature: 09/15/2020 4:13:19 PM Version By: Sandre Kitty Previous Signature: 10/06/2020 9:00:33 PM Version By: Deon Pilling Entered By: Deon Pilling on 10/08/2020 20:17:53 -------------------------------------------------------------------------------- Wound Assessment Details Patient Name: Date of Service: [name], MA [place] 6 L. 09/15/2020 1:15 PM Medical Record Number: [id number] Patient Account Number: [id number] Date of Birth/Sex: Treating RN: 04-Nov-1965 (55 y.o. Lorette Ang, Meta.Reding Primary Care Sebastian Dzik: [name] Other Clinician: Referring Britlyn Martine: Treating Mariha Sleeper/Extender: [name] in Treatment: 10 Wound Status Wound Number: 7  Primary T be determined o Etiology: Wound Location: Left Gluteus Wound Open Wounding Event: Gradually Appeared Status: Date Acquired: 09/15/2020 Comorbid Anemia, Congestive Heart Failure, Hypertension, Type II Diabetes, Weeks Of Treatment: 0 History: End Stage Renal Disease, Raynauds, Neuropathy Clustered Wound: No Photos Wound Measurements Length: (cm) 0.6 Width: (cm) 0.7 Depth: (cm) 0.1 Area: (cm) 0.33 Volume: (cm) 0.033 Wound Description Classification: Full Thickness Without Exposed Support Structures Wound Margin: Distinct, outline attached Exudate Amount: Medium Exudate Type: Serosanguineous Exudate Color: red, brown Foul Odor After Cleansing: N Slough/Fibrino Y % Reduction in Area: 0% %  Reduction in Volume: 0% Epithelialization: None Tunneling: No Undermining: No o es Wound Bed Granulation Amount: None Present (0%) Exposed Structure Necrotic Amount: Large (67-100%) Fascia Exposed: No Necrotic Quality: Adherent Slough Fat Layer (Subcutaneous Tissue) Exposed: Yes Tendon Exposed: No Muscle Exposed: No Joint Exposed: No Bone Exposed: No Treatment Notes Wound #7 (Gluteus) Wound Laterality: Left Cleanser Soap and Water Discharge Instruction: May shower and wash wound with dial antibacterial soap and water prior to dressing change. Peri-Wound Care Topical Antibiotic Ointment Primary Dressing Secondary Dressing Bordered Gauze, 2x3.75 in Discharge Instruction: Apply over primary dressing as directed. Secured With Compression Wrap Compression Stockings Environmental education officer) Signed: 10/08/2020 8:19:15 PM By: Deon Pilling Previous Signature: 09/15/2020 4:13:19 PM Version By: Sandre Kitty Previous Signature: 10/06/2020 9:00:33 PM Version By: Deon Pilling Entered By: Deon Pilling on 10/08/2020 20:18:48 -------------------------------------------------------------------------------- Wound Assessment Details Patient Name: Date of  Service: [name], MA [place] L. 09/15/2020 1:15 PM Medical Record Number: [id number] Patient Account Number: [id number] Date of Birth/Sex: Treating RN: [birth year] (55 y.o. [name] Primary Care Advay Volante: [name] Other Clinician: Referring Babetta Paterson: Treating Glendola Friedhoff/Extender: [name] in Treatment: 10 Wound Status Wound Number: 8 Primary Pressure Ulcer Etiology: Wound Location: Right, Distal Back Wound Open Wounding Event: Pressure Injury Status: Date Acquired: 09/15/2020 Comorbid Anemia, Congestive Heart Failure, Hypertension, Type II Diabetes, Weeks Of Treatment: 0 History: End Stage Renal Disease, Raynauds, Neuropathy Clustered Wound: No Photos Wound Measurements Length: (cm) 1.1 Width: (cm) 1.1 Depth: (cm) 0.1 Area: (cm) 0.95 Volume: (cm) 0.095 % Reduction in Area: 0% % Reduction in Volume: 0% Epithelialization: None Tunneling: No Undermining: No Wound Description Classification: Unstageable/Unclassified Wound Margin: Distinct, outline attached Exudate Amount: Medium Exudate Type: Serosanguineous Exudate Color: red, brown Foul Odor After Cleansing: No Slough/Fibrino Yes Wound Bed Granulation Amount: None Present (0%) Exposed Structure Necrotic Amount: Large (67-100%) Fat Layer (Subcutaneous Tissue) Exposed: Yes Electronic Signature(s) Signed: 09/15/2020 4:13:19 PM By: Sandre Kitty Signed: 09/15/2020 5:08:20 PM By: Lorrin Jackson Entered By: Sandre Kitty on 09/15/2020 16:10:11 -------------------------------------------------------------------------------- Vitals Details Patient Name: Date of Service: [name], MA [name] 09/15/2020 1:15 PM Medical Record Number: [id number] Patient Account Number: [id number] Date of Birth/Sex: Treating RN: Jun 12, 1965 (55 y.o. Lorette Ang, Meta.Reding Primary Care Adriyanna Christians: [name] Other Clinician: Referring Avrianna Smart: Treating Taviana Westergren/Extender: [name] in Treatment: 10 Vital Signs Time Taken: 13:16 Temperature (F): 98.9 Height (in): 65 Pulse (bpm): 111 Weight (lbs): 240 Respiratory Rate (breaths/min): 20 Body Mass Index (BMI): 39.9 Blood Pressure (mmHg): 97/50 Reference Range: 80 - 120 mg / dl Electronic Signature(s) Signed: 10/08/2020 8:19:15 PM By: Deon Pilling Previous Signature: 10/06/2020 9:00:33 PM Version By: Deon Pilling Entered By: Deon Pilling on 10/08/2020 20:12:44

## 2020-10-10 ENCOUNTER — Inpatient Hospital Stay (HOSPITAL_COMMUNITY): Payer: Medicare Other | Admitting: Anesthesiology

## 2020-10-10 ENCOUNTER — Encounter (HOSPITAL_COMMUNITY): Payer: Self-pay | Admitting: Internal Medicine

## 2020-10-10 ENCOUNTER — Encounter (HOSPITAL_COMMUNITY)
Admission: EM | Disposition: A | Discharge status: Skilled Nursing Facility | Payer: Self-pay | Source: Home / Self Care | Attending: Internal Medicine

## 2020-10-10 DIAGNOSIS — M8600 Acute hematogenous osteomyelitis, unspecified site: Secondary | ICD-10-CM | POA: Diagnosis not present

## 2020-10-10 DIAGNOSIS — E11628 Type 2 diabetes mellitus with other skin complications: Secondary | ICD-10-CM | POA: Diagnosis not present

## 2020-10-10 DIAGNOSIS — L089 Local infection of the skin and subcutaneous tissue, unspecified: Secondary | ICD-10-CM | POA: Diagnosis not present

## 2020-10-10 DIAGNOSIS — M86171 Other acute osteomyelitis, right ankle and foot: Secondary | ICD-10-CM | POA: Diagnosis not present

## 2020-10-10 DIAGNOSIS — M86172 Other acute osteomyelitis, left ankle and foot: Secondary | ICD-10-CM

## 2020-10-10 DIAGNOSIS — M86679 Other chronic osteomyelitis, unspecified ankle and foot: Secondary | ICD-10-CM | POA: Diagnosis not present

## 2020-10-10 DIAGNOSIS — S90811A Abrasion, right foot, initial encounter: Secondary | ICD-10-CM | POA: Diagnosis not present

## 2020-10-10 HISTORY — PX: AMPUTATION: SHX166

## 2020-10-10 HISTORY — PX: APPLICATION OF WOUND VAC: SHX5189

## 2020-10-10 LAB — CBC
HCT: 29.9 % — ABNORMAL LOW (ref 39.0–52.0)
Hemoglobin: 8.8 g/dL — ABNORMAL LOW (ref 13.0–17.0)
MCH: 28.9 pg (ref 26.0–34.0)
MCHC: 29.4 g/dL — ABNORMAL LOW (ref 30.0–36.0)
MCV: 98 fL (ref 80.0–100.0)
Platelets: 388 10*3/uL (ref 150–400)
RBC: 3.05 MIL/uL — ABNORMAL LOW (ref 4.22–5.81)
RDW: 16.4 % — ABNORMAL HIGH (ref 11.5–15.5)
WBC: 10.8 10*3/uL — ABNORMAL HIGH (ref 4.0–10.5)
nRBC: 0.2 % (ref 0.0–0.2)

## 2020-10-10 LAB — RENAL FUNCTION PANEL
Albumin: 2.6 g/dL — ABNORMAL LOW (ref 3.5–5.0)
Anion gap: 15 (ref 5–15)
BUN: 36 mg/dL — ABNORMAL HIGH (ref 6–20)
CO2: 23 mmol/L (ref 22–32)
Calcium: 8.7 mg/dL — ABNORMAL LOW (ref 8.9–10.3)
Chloride: 96 mmol/L — ABNORMAL LOW (ref 98–111)
Creatinine, Ser: 5.78 mg/dL — ABNORMAL HIGH (ref 0.61–1.24)
GFR, Estimated: 11 mL/min — ABNORMAL LOW (ref >60)
Glucose, Bld: 83 mg/dL (ref 70–99)
Phosphorus: 5.7 mg/dL — ABNORMAL HIGH (ref 2.5–4.6)
Potassium: 4.1 mmol/L (ref 3.5–5.1)
Sodium: 134 mmol/L — ABNORMAL LOW (ref 135–145)

## 2020-10-10 LAB — SURGICAL PCR SCREEN
MRSA, PCR: NEGATIVE
Staphylococcus aureus: NEGATIVE

## 2020-10-10 LAB — PREPARE RBC (CROSSMATCH)

## 2020-10-10 LAB — GLUCOSE, CAPILLARY: Glucose-Capillary: 97 mg/dL (ref 70–99)

## 2020-10-10 SURGERY — AMPUTATION BELOW KNEE
Anesthesia: General | Site: Knee | Laterality: Bilateral

## 2020-10-10 MED ORDER — PROMETHAZINE HCL 25 MG/ML IJ SOLN: 6.2500 mg | INTRAMUSCULAR | Status: DC | PRN

## 2020-10-10 MED ORDER — PHENYLEPHRINE 40 MCG/ML (10ML) SYRINGE FOR IV PUSH (FOR BLOOD PRESSURE SUPPORT)
PREFILLED_SYRINGE | INTRAVENOUS | Status: DC | PRN
  Administered 2020-10-10 (×2): 120 ug via INTRAVENOUS
  Administered 2020-10-10: 80 ug via INTRAVENOUS

## 2020-10-10 MED ORDER — BISACODYL 10 MG RE SUPP
10.0000 mg | Freq: Once | RECTAL | Status: AC
  Administered 2020-10-11: 10 mg via RECTAL
  Filled 2020-10-10: qty 1

## 2020-10-10 MED ORDER — FENTANYL CITRATE (PF) 100 MCG/2ML IJ SOLN
INTRAMUSCULAR | Status: AC
  Administered 2020-10-10: 25 ug via INTRAVENOUS
  Filled 2020-10-10: qty 2

## 2020-10-10 MED ORDER — ONDANSETRON HCL 4 MG/2ML IJ SOLN
INTRAMUSCULAR | Status: AC
  Filled 2020-10-10: qty 2

## 2020-10-10 MED ORDER — FENTANYL CITRATE (PF) 100 MCG/2ML IJ SOLN
50.0000 ug | Freq: Once | INTRAMUSCULAR | Status: AC
  Administered 2020-10-10: 50 ug via INTRAVENOUS

## 2020-10-10 MED ORDER — PHENOL 1.4 % MT LIQD: 1.0000 | OROMUCOSAL | Status: DC | PRN

## 2020-10-10 MED ORDER — MIDAZOLAM HCL 2 MG/2ML IJ SOLN
INTRAMUSCULAR | Status: AC
  Filled 2020-10-10: qty 2

## 2020-10-10 MED ORDER — LIDOCAINE 2% (20 MG/ML) 5 ML SYRINGE
INTRAMUSCULAR | Status: DC | PRN
  Administered 2020-10-10: 100 mg via INTRAVENOUS

## 2020-10-10 MED ORDER — ACETAMINOPHEN 500 MG PO TABS
ORAL_TABLET | ORAL | Status: AC
  Filled 2020-10-10: qty 2

## 2020-10-10 MED ORDER — FENTANYL CITRATE (PF) 250 MCG/5ML IJ SOLN
INTRAMUSCULAR | Status: AC
  Filled 2020-10-10: qty 5

## 2020-10-10 MED ORDER — OXYCODONE HCL 5 MG PO TABS: 5.0000 mg | ORAL_TABLET | Freq: Once | ORAL | Status: DC | PRN

## 2020-10-10 MED ORDER — PROPOFOL 10 MG/ML IV BOLUS
INTRAVENOUS | Status: DC | PRN
Start: 2020-10-10 — End: 2020-10-10
  Administered 2020-10-10: 90 mg via INTRAVENOUS

## 2020-10-10 MED ORDER — ETOMIDATE 2 MG/ML IV SOLN
INTRAVENOUS | Status: DC | PRN
  Administered 2020-10-10: 12 mg via INTRAVENOUS

## 2020-10-10 MED ORDER — ASCORBIC ACID 500 MG PO TABS
1000.0000 mg | ORAL_TABLET | Freq: Every day | ORAL | Status: DC
  Administered 2020-10-11 – 2020-10-26 (×14): 1000 mg via ORAL
  Filled 2020-10-10 (×18): qty 2

## 2020-10-10 MED ORDER — PROPOFOL 10 MG/ML IV BOLUS
INTRAVENOUS | Status: AC
  Filled 2020-10-10: qty 20

## 2020-10-10 MED ORDER — GUAIFENESIN-DM 100-10 MG/5ML PO SYRP: 15.0000 mL | ORAL_SOLUTION | ORAL | Status: DC | PRN

## 2020-10-10 MED ORDER — LIDOCAINE 2% (20 MG/ML) 5 ML SYRINGE
INTRAMUSCULAR | Status: AC
  Filled 2020-10-10: qty 5

## 2020-10-10 MED ORDER — FENTANYL CITRATE (PF) 100 MCG/2ML IJ SOLN
25.0000 ug | Freq: Once | INTRAMUSCULAR | Status: AC
  Filled 2020-10-10: qty 0.5

## 2020-10-10 MED ORDER — SODIUM CHLORIDE 0.9 % IV SOLN: INTRAVENOUS | Status: DC

## 2020-10-10 MED ORDER — CEFAZOLIN SODIUM-DEXTROSE 2-4 GM/100ML-% IV SOLN
2.0000 g | INTRAVENOUS | Status: AC
  Administered 2020-10-10: 2 g via INTRAVENOUS
  Filled 2020-10-10: qty 100

## 2020-10-10 MED ORDER — PHENYLEPHRINE HCL-NACL 10-0.9 MG/250ML-% IV SOLN
INTRAVENOUS | Status: DC | PRN
  Administered 2020-10-10: 30 ug/min via INTRAVENOUS

## 2020-10-10 MED ORDER — VANCOMYCIN HCL IN DEXTROSE 1-5 GM/200ML-% IV SOLN
INTRAVENOUS | Status: AC
  Filled 2020-10-10: qty 200

## 2020-10-10 MED ORDER — FENTANYL CITRATE (PF) 100 MCG/2ML IJ SOLN
25.0000 ug | INTRAMUSCULAR | Status: DC | PRN
  Administered 2020-10-10: 25 ug via INTRAVENOUS

## 2020-10-10 MED ORDER — ACETAMINOPHEN 500 MG PO TABS
1000.0000 mg | ORAL_TABLET | Freq: Once | ORAL | Status: AC
  Administered 2020-10-10: 1000 mg via ORAL

## 2020-10-10 MED ORDER — FENTANYL CITRATE (PF) 100 MCG/2ML IJ SOLN
INTRAMUSCULAR | Status: AC
  Filled 2020-10-10: qty 2

## 2020-10-10 MED ORDER — MIDAZOLAM HCL 2 MG/2ML IJ SOLN
INTRAMUSCULAR | Status: AC
  Administered 2020-10-10: 1 mg via INTRAVENOUS
  Filled 2020-10-10: qty 2

## 2020-10-10 MED ORDER — DEXAMETHASONE SODIUM PHOSPHATE 10 MG/ML IJ SOLN
INTRAMUSCULAR | Status: AC
  Filled 2020-10-10: qty 1

## 2020-10-10 MED ORDER — CHLORHEXIDINE GLUCONATE 0.12 % MT SOLN
OROMUCOSAL | Status: AC
  Administered 2020-10-10: 15 mL via OROMUCOSAL
  Filled 2020-10-10: qty 15

## 2020-10-10 MED ORDER — OXYCODONE HCL 5 MG/5ML PO SOLN: 5.0000 mg | Freq: Once | ORAL | Status: DC | PRN

## 2020-10-10 MED ORDER — ALUM & MAG HYDROXIDE-SIMETH 200-200-20 MG/5ML PO SUSP: 15.0000 mL | ORAL | Status: DC | PRN

## 2020-10-10 MED ORDER — DEXTROSE 50 % IV SOLN
12.5000 g | INTRAVENOUS | Status: AC
  Administered 2020-10-10: 12.5 g via INTRAVENOUS
  Filled 2020-10-10: qty 50

## 2020-10-10 MED ORDER — ONDANSETRON HCL 4 MG/2ML IJ SOLN
INTRAMUSCULAR | Status: DC | PRN
  Administered 2020-10-10: 4 mg via INTRAVENOUS

## 2020-10-10 MED ORDER — MIDAZOLAM HCL 2 MG/2ML IJ SOLN: 1.0000 mg | Freq: Once | INTRAMUSCULAR | Status: AC

## 2020-10-10 MED ORDER — ZINC SULFATE 220 (50 ZN) MG PO CAPS
220.0000 mg | ORAL_CAPSULE | Freq: Every day | ORAL | Status: AC
  Administered 2020-10-11 – 2020-10-22 (×11): 220 mg via ORAL
  Filled 2020-10-10 (×13): qty 1

## 2020-10-10 MED ORDER — POLYETHYLENE GLYCOL 3350 17 G PO PACK: 17.0000 g | PACK | Freq: Every day | ORAL | Status: DC | PRN

## 2020-10-10 MED ORDER — 0.9 % SODIUM CHLORIDE (POUR BTL) OPTIME
TOPICAL | Status: DC | PRN
  Administered 2020-10-10: 1000 mL

## 2020-10-10 MED ORDER — TRANEXAMIC ACID-NACL 1000-0.7 MG/100ML-% IV SOLN
1000.0000 mg | Freq: Once | INTRAVENOUS | Status: DC
  Filled 2020-10-10: qty 100

## 2020-10-10 MED ORDER — HYDROMORPHONE HCL 1 MG/ML IJ SOLN
INTRAMUSCULAR | Status: AC
  Filled 2020-10-10: qty 1

## 2020-10-10 MED ORDER — JUVEN PO PACK
1.0000 | PACK | Freq: Two times a day (BID) | ORAL | Status: DC
  Administered 2020-10-11 – 2020-10-26 (×11): 1 via ORAL
  Filled 2020-10-10 (×19): qty 1

## 2020-10-10 MED ORDER — SODIUM CHLORIDE 0.9 % IV SOLN
INTRAVENOUS | Status: DC
Start: 2020-10-10 — End: 2020-10-12

## 2020-10-10 MED ORDER — CHLORHEXIDINE GLUCONATE 0.12 % MT SOLN: 15.0000 mL | Freq: Once | OROMUCOSAL | Status: AC

## 2020-10-10 MED ORDER — PANTOPRAZOLE SODIUM 40 MG PO TBEC
40.0000 mg | DELAYED_RELEASE_TABLET | Freq: Every day | ORAL | Status: DC
  Administered 2020-10-11 – 2020-10-26 (×14): 40 mg via ORAL
  Filled 2020-10-10 (×18): qty 1

## 2020-10-10 MED ORDER — DOCUSATE SODIUM 100 MG PO CAPS
100.0000 mg | ORAL_CAPSULE | Freq: Every day | ORAL | Status: DC
Start: 2020-10-11 — End: 2020-10-13
  Administered 2020-10-11 – 2020-10-12 (×2): 100 mg via ORAL
  Filled 2020-10-10 (×2): qty 1

## 2020-10-10 MED ORDER — VANCOMYCIN VARIABLE DOSE PER UNSTABLE RENAL FUNCTION (PHARMACIST DOSING): Status: DC

## 2020-10-10 MED ORDER — ORAL CARE MOUTH RINSE: 15.0000 mL | Freq: Once | OROMUCOSAL | Status: AC

## 2020-10-10 MED ORDER — FENTANYL CITRATE (PF) 100 MCG/2ML IJ SOLN
25.0000 ug | Freq: Once | INTRAMUSCULAR | Status: AC
  Administered 2020-10-10: 25 ug via INTRAVENOUS
  Filled 2020-10-10: qty 0.5

## 2020-10-10 MED ORDER — BUPIVACAINE-EPINEPHRINE (PF) 0.25% -1:200000 IJ SOLN
INTRAMUSCULAR | Status: DC | PRN
  Administered 2020-10-10 (×2): 30 mL via PERINEURAL

## 2020-10-10 MED ORDER — ETOMIDATE 2 MG/ML IV SOLN
INTRAVENOUS | Status: AC
  Filled 2020-10-10: qty 10

## 2020-10-10 MED ORDER — ROCURONIUM BROMIDE 10 MG/ML (PF) SYRINGE
PREFILLED_SYRINGE | INTRAVENOUS | Status: AC
  Filled 2020-10-10: qty 10

## 2020-10-10 MED ORDER — SODIUM CHLORIDE 0.9% IV SOLUTION: Freq: Once | INTRAVENOUS | Status: AC

## 2020-10-10 MED ORDER — VANCOMYCIN HCL IN DEXTROSE 1-5 GM/200ML-% IV SOLN
1000.0000 mg | INTRAVENOUS | Status: AC
  Administered 2020-10-10: 1000 mg via INTRAVENOUS

## 2020-10-10 MED ORDER — ONDANSETRON HCL 4 MG/2ML IJ SOLN
4.0000 mg | Freq: Four times a day (QID) | INTRAMUSCULAR | Status: DC | PRN
  Administered 2020-10-17 (×2): 4 mg via INTRAVENOUS
  Filled 2020-10-10 (×2): qty 2

## 2020-10-10 SURGICAL SUPPLY — 42 items
BAG COUNTER SPONGE SURGICOUNT (BAG) IMPLANT
BAG SPNG CNTER NS LX DISP (BAG)
BLADE SAW RECIP 87.9 MT (BLADE) ×2 IMPLANT
BLADE SURG 21 STRL SS (BLADE) ×3 IMPLANT
BNDG COHESIVE 6X5 TAN STRL LF (GAUZE/BANDAGES/DRESSINGS) ×2 IMPLANT
CANISTER WOUND CARE 500ML ATS (WOUND CARE) ×2 IMPLANT
COVER SURGICAL LIGHT HANDLE (MISCELLANEOUS) ×2 IMPLANT
CUFF TOURN SGL QUICK 34 (TOURNIQUET CUFF) ×2
CUFF TRNQT CYL 34X4.125X (TOURNIQUET CUFF) ×1 IMPLANT
DRAPE DERMATAC (DRAPES) ×3 IMPLANT
DRAPE INCISE IOBAN 66X45 STRL (DRAPES) ×2 IMPLANT
DRAPE U-SHAPE 47X51 STRL (DRAPES) ×3 IMPLANT
DRESSING PREVENA PLUS CUSTOM (GAUZE/BANDAGES/DRESSINGS) ×1 IMPLANT
DRSG PREVENA PLUS CUSTOM (GAUZE/BANDAGES/DRESSINGS) ×4
DURAPREP 26ML APPLICATOR (WOUND CARE) ×3 IMPLANT
ELECT REM PT RETURN 9FT ADLT (ELECTROSURGICAL) ×2
ELECTRODE REM PT RTRN 9FT ADLT (ELECTROSURGICAL) ×1 IMPLANT
GLOVE SURG ORTHO LTX SZ9 (GLOVE) ×2 IMPLANT
GLOVE SURG UNDER POLY LF SZ9 (GLOVE) ×2 IMPLANT
GOWN STRL REUS W/ TWL XL LVL3 (GOWN DISPOSABLE) ×2 IMPLANT
GOWN STRL REUS W/TWL XL LVL3 (GOWN DISPOSABLE) ×4
KIT BASIN OR (CUSTOM PROCEDURE TRAY) ×2 IMPLANT
KIT TURNOVER KIT B (KITS) ×2 IMPLANT
MANIFOLD NEPTUNE II (INSTRUMENTS) ×2 IMPLANT
NS IRRIG 1000ML POUR BTL (IV SOLUTION) ×2 IMPLANT
PACK ORTHO EXTREMITY (CUSTOM PROCEDURE TRAY) ×2 IMPLANT
PAD ARMBOARD 7.5X6 YLW CONV (MISCELLANEOUS) ×2 IMPLANT
PREVENA RESTOR ARTHOFORM 33X30 (CANNISTER) ×2 IMPLANT
PREVENA RESTOR ARTHOFORM 46X30 (CANNISTER) ×3 IMPLANT
PREVENA RESTOR AXIOFORM 29X28 (GAUZE/BANDAGES/DRESSINGS) ×1 IMPLANT
SPONGE T-LAP 18X18 ~~LOC~~+RFID (SPONGE) IMPLANT
STAPLER VISISTAT 35W (STAPLE) IMPLANT
STOCKINETTE IMPERVIOUS LG (DRAPES) ×2 IMPLANT
SUT ETHILON 2 0 PSLX (SUTURE) IMPLANT
SUT SILK 2 0 (SUTURE) ×2
SUT SILK 2-0 18XBRD TIE 12 (SUTURE) ×1 IMPLANT
SUT VIC AB 1 CTX 27 (SUTURE) ×4 IMPLANT
SUT VIC AB 1 CTX 36 (SUTURE) ×4
SUT VIC AB 1 CTX36XBRD ANBCTRL (SUTURE) IMPLANT
TOWEL GREEN STERILE (TOWEL DISPOSABLE) ×2 IMPLANT
TUBE CONNECTING 12X1/4 (SUCTIONS) ×2 IMPLANT
YANKAUER SUCT BULB TIP NO VENT (SUCTIONS) ×2 IMPLANT

## 2020-10-10 NOTE — Progress Notes (Signed)
PROGRESS NOTE    [name] [name]  [id number] DOB: 1966/02/25 DOA: 10/07/2020 PCP: [name], MD    Brief Narrative:  55 y/o male with history of IDDM, ESRD who was recently admitted with bilateral heel osteomyelitis and necrotic ulcers. He refused b/l BKA at that time. He was readmitted with worsening bilateral foot pain. He was seen by orthopedics and podiatry who are recommending bilateral bka.    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)   Chronic osteomyelitis of foot ([name])   Cutaneous abscess of foot   Bilateral heel osteomyelitis/abscess ulceration and necrosis -was recommended to have b/l BKA during last admission but refused -patient has been receiving IV cefepime as outpatient at his dialysis center  -it appears he was also receiving doxycycline and flagyl -readmitted for worsening pain  -restarted on IV antibiotics, vancomycin and zosyn -seen by podiatry and orthopedics, both recommending b/l BKA -review of notes indicate that patient now agreeable for b/l BKA -plans for OR today  ESRD -TTS -seen by nephrology -underwent HD on 7/20 -plans on another HD session today  Lethargy -possibly medication related -gabapentin dose reduced -change oxycodone to prn -ammonia not significantly elevated  Sacral wounds -WOC consulted  Anasarca -patient is hypoalbuminemic  -nutrition to follow  IDDM -chronically on insulin pump -will hold for now and start on lantus/SSI -he has been having hypoglycemic episodes yesterday and today -currently npo for surgery -hold off on lantus today  Anemia of chronic disease -likely related to renal disease -his chronic foot infections likely also contributing -transfused 1 unit prbc preoperatively in anticipation for surgery -continue to follow  Chronic systolic CHF -EF [id number] -volume management with dialysis -suspect that  hypoalbuminemia also contributing to anasarca  Constipation -will order suppository to be given after surgery/dialysis     DVT prophylaxis: heparin injection 5,000 Units Start: 10/07/20 2200  Code Status: full code Family Communication: discussed with mother and brother at bedside Disposition Plan: Status is: Inpatient  Remains inpatient appropriate because:Ongoing active pain requiring inpatient pain management, Ongoing diagnostic testing needed not appropriate for outpatient work up, IV treatments appropriate due to intensity of illness or inability to take PO, and Inpatient level of care appropriate due to severity of illness  Dispo: The patient is from: Home              Anticipated d/c is to:  TBD              Patient currently is medically stable to d/c.   Difficult to place patient No         Consultants:  Orthopedics, Dr. Sharol Given Nephrology Podiatry, Dr. Jacqualyn Posey  Procedures:    Antimicrobials:  Vancomycin 7/20> Zosyn 7/20>    Subjective: Complains of constipation. He has continued pain  Objective: Vitals:   10/10/20 0714 10/10/20 0732 10/10/20 0734 10/10/20 1021  BP: 109/64 123/82 123/82 134/84  Pulse: 92 94 94 95  Resp: '18 18 18 18  '$ Temp: 98.7 F (37.1 C) 98.3 F (36.8 C) 98.3 F (36.8 C) 98.5 F (36.9 C)  TempSrc: Oral  Oral   SpO2: 98%  96% 100%  Weight:        Intake/Output Summary (Last 24 hours) at 10/10/2020 1024 Last data filed at 10/10/2020 0913 Gross per 24 hour  Intake 1049.39 ml  Output --  Net 1049.39 ml   Filed Weights   10/08/20 0730 10/08/20 1148 10/10/20 [id number]  Weight: 95.8 kg 91 kg 91 kg    Examination:  General exam: Appears calm and comfortable  Respiratory system: Clear to auscultation. Respiratory effort normal. Cardiovascular system: S1 & S2 heard, RRR. No JVD, murmurs, rubs, gallops or clicks.  Gastrointestinal system: Abdomen isdistended, soft and nontender. No organomegaly or masses felt. Normal bowel sounds  heard. Central nervous system: No focal neurological deficits. Extremities: bilateral necrotic ulcers on heels, anasarca is present Skin: sacral wounds  Psychiatry: more awake today    Data Reviewed: I have personally reviewed following labs and imaging studies  CBC: Recent Labs  Lab 10/07/20 1248 10/08/20 0000 10/10/20 0122  WBC 10.1 9.1 10.8*  NEUTROABS 8.8*  --   --   HGB 9.0* 8.2* 8.8*  HCT 31.6* 28.9* 29.9*  MCV 100.0 101.8* 98.0  PLT 357 354 [id number]   Basic Metabolic Panel: Recent Labs  Lab 10/07/20 1248 10/08/20 0000 10/09/20 1322 10/10/20 0122  NA 132* 134* 133* 134*  K 5.3* 4.5 4.1 4.1  CL 94* 95* 95* 96*  CO2 26 21* 24 23  GLUCOSE 366* 272* 118* 83  BUN 40* 44* 30* 36*  CREATININE 6.98* 6.99* 5.31* 5.78*  CALCIUM 8.3* 8.2* 8.7* 8.7*  PHOS  --   --   --  5.7*   GFR: Estimated Creatinine Clearance: 15.1 mL/min (A) (by C-G formula based on SCr of 5.78 mg/dL (H)). Liver Function Tests: Recent Labs  Lab 10/07/20 1248 10/10/20 0122  AST 13*  --   ALT <5  --   ALKPHOS 427*  --   BILITOT 1.1  --   PROT 6.4*  --   ALBUMIN 1.8* 2.6*   No results for input(s): LIPASE, AMYLASE in the last 168 hours. Recent Labs  Lab 10/09/20 1607  AMMONIA 38*   Coagulation Profile: Recent Labs  Lab 10/07/20 1248  INR 1.3*   Cardiac Enzymes: No results for input(s): CKTOTAL, CKMB, CKMBINDEX, TROPONINI in the last 168 hours. BNP (last 3 results) No results for input(s): PROBNP in the last 8760 hours. HbA1C: No results for input(s): HGBA1C in the last 72 hours. CBG: Recent Labs  Lab 10/07/20 1208 10/08/20 0155 10/08/20 0641  GLUCAP 356* 250* 224*   Lipid Profile: No results for input(s): CHOL, HDL, LDLCALC, TRIG, CHOLHDL, LDLDIRECT in the last 72 hours. Thyroid Function Tests: No results for input(s): TSH, T4TOTAL, FREET4, T3FREE, THYROIDAB in the last 72 hours. Anemia Panel: Recent Labs    10/07/20 1900 10/08/20 0404  TIBC 118*  --   IRON 43*  --    RETICCTPCT  --  1.2   Sepsis Labs: Recent Labs  Lab 10/07/20 1248 10/07/20 2033  LATICACIDVEN 2.2* 2.6*    Recent Results (from the past 240 hour(s))  Culture, blood (Routine x 2)     Status: None (Preliminary result)   Collection Time: 10/07/20  1:06 PM   Specimen: BLOOD  Result Value Ref Range Status   Specimen Description BLOOD LEFT ANTECUBITAL  Final   Special Requests   Final    BOTTLES DRAWN AEROBIC AND ANAEROBIC Blood Culture results may not be optimal due to an inadequate volume of blood received in culture bottles   Culture   Final    NO GROWTH 3 DAYS Performed at [facility], [place] [address] [place], [place] [address]    Report Status PENDING  Incomplete  SARS CORONAVIRUS 2 (TAT 6-24 HRS) Nasopharyngeal Nasopharyngeal Swab     Status: None   Collection Time: 10/07/20  4:39  PM   Specimen: Nasopharyngeal Swab  Result Value Ref Range Status   SARS Coronavirus 2 NEGATIVE NEGATIVE Final    Comment: (NOTE) SARS-CoV-2 target nucleic acids are NOT DETECTED.  The SARS-CoV-2 RNA is generally detectable in upper and lower respiratory specimens during the acute phase of infection. Negative results do not preclude SARS-CoV-2 infection, do not rule out co-infections with other pathogens, and should not be used as the sole basis for treatment or other patient management decisions. Negative results must be combined with clinical observations, patient history, and epidemiological information. The expected result is Negative.  Fact Sheet for Patients: SugarRoll.be  Fact Sheet for Healthcare Providers: https://www.woods-mathews.com/  This test is not yet approved or cleared by the [place] FDA and  has been authorized for detection and/or diagnosis of SARS-CoV-2 by FDA under an Emergency Use Authorization (EUA). This EUA will remain  in effect (meaning this test can be used) for the duration of the COVID-19 declaration  under Se ction 564(b)(1) of the Act, 21 U.S.C. section 360bbb-3(b)(1), unless the authorization is terminated or revoked sooner.  Performed at [facility], [place] [address], [place], [place] [address]   Culture, blood (Routine x 2)     Status: None (Preliminary result)   Collection Time: 10/07/20  8:07 PM   Specimen: BLOOD LEFT HAND  Result Value Ref Range Status   Specimen Description BLOOD LEFT HAND  Final   Special Requests   Final    BOTTLES DRAWN AEROBIC AND ANAEROBIC Blood Culture adequate volume   Culture   Final    NO GROWTH 3 DAYS Performed at [facility], [place] [address] [place], [place] [address]    Report Status PENDING  Incomplete  Culture, blood (single)     Status: None (Preliminary result)   Collection Time: 10/08/20  4:04 AM   Specimen: BLOOD  Result Value Ref Range Status   Specimen Description BLOOD SITE NOT SPECIFIED  Final   Special Requests   Final    BOTTLES DRAWN AEROBIC AND ANAEROBIC Blood Culture adequate volume   Culture   Final    NO GROWTH 2 DAYS Performed at [facility], [address], [place], [place] [address]    Report Status PENDING  Incomplete         Radiology Studies: No results found.      Scheduled Meds:  (feeding supplement) PROSource Plus  30 mL Oral BID BM   aspirin  325 mg Oral Daily   atorvastatin  10 mg Oral QPM   calcium acetate  1,334 mg Oral TID WC   Chlorhexidine Gluconate Cloth  6 each Topical Q0600   cinacalcet  30 mg Oral Q breakfast   collagenase   Topical Daily   cycloSPORINE  1 drop Both Eyes Daily   [START ON 10/16/2020] darbepoetin (ARANESP) injection - DIALYSIS  150 mcg Intravenous Q Thu-HD   gabapentin  300 mg Oral QHS   heparin  5,000 Units Subcutaneous Q8H   insulin aspart  0-6 Units Subcutaneous Q4H   midodrine  5 mg Oral Q T,Th,Sat-1800   sucroferric oxyhydroxide  500 mg Oral Q breakfast   Continuous Infusions:  albumin human 120 mL/hr at 10/10/20 0913    ceFAZolin (ANCEF)  IV     piperacillin-tazobactam (ZOSYN)  IV Stopped (10/10/20 0650)   vancomycin       LOS: 3 days    Time spent: 37mns    JKathie Dike MD [facility]  If 7PM-7AM, please contact night-coverage [email or web address]  10/10/2020, 10:24 AM

## 2020-10-10 NOTE — Progress Notes (Signed)
Pharmacy Antibiotic Note  [name] [name] is a 55 y.o. male admitted on 10/07/2020 with  Lateral heel osteomyelitis, acute on chronic, failed outpatient antibiotic treatment  - ortho recommends amputation for source control but pt is hesitant. Pt on po doxy and flagyl PTA. Pharmacy has been consulted for Vancomycin and Zosyn dosing.  The patient is ESRD-TTS but has received off-schedule on 7/20 and now again on 7/22. Will enter a maintenance dose to be given with this evening's HD sessions.   Plan: - Vancomycin 1g IV x 1 dose post-HD Friday - Will d/c standing Vancomycin orders and f/u HD plans to enter subsequent doses - Continue Zosyn 2.25g IV every 8 hours - Will continue to follow HD schedule/duration, culture results, LOT, and antibiotic de-escalation plans     Weight: 91 kg (200 lb 9.9 oz)  Temp (24hrs), Avg:97.9 F (36.6 C), Min:97 F (36.1 C), Max:98.7 F (37.1 C)  Recent Labs  Lab 10/07/20 1248 10/07/20 2033 10/08/20 0000 10/09/20 1322 10/10/20 0122  WBC 10.1  --  9.1  --  10.8*  CREATININE 6.98*  --  6.99* 5.31* 5.78*  LATICACIDVEN 2.2* 2.6*  --   --   --      Estimated Creatinine Clearance: 15.1 mL/min (A) (by C-G formula based on SCr of 5.78 mg/dL (H)).    No Known Allergies   Thank you for allowing pharmacy to be a part of this patient's care.  Alycia Rossetti, PharmD, BCPS Clinical Pharmacist Clinical phone for 10/10/2020: [phone] 10/10/2020 7:09 PM   **Pharmacist phone directory can now be found on [email or web address] ([email or web address]).  Listed under [place].

## 2020-10-10 NOTE — Progress Notes (Signed)
Hypoglycemic Event   CBG: 66   Treatment: Dextrose 50% solution 12.5g   Symptoms: No symptoms present.   Follow-up CBG: Time:0150   CBG Result: 95   Possible Reasons for Event: Inadequate meal intake   Comments/MD notified: Pt is NPO for OR in the morning; MD has been notified.    Will continue to monitor

## 2020-10-10 NOTE — Progress Notes (Addendum)
[facility] Progress Note   Subjective:  Seen in room. No CP or dyspnea. For B BKA today. Unfortunately, did not get dialysis yesterday -- very large HD patient census, will dialyze today AFTER his surgery.  Objective Vitals:   10/10/20 0732 10/10/20 0734 10/10/20 1021 10/10/20 1050  BP: 123/82 123/82 134/84 (!) 136/95  Pulse: 94 94 95 99  Resp: '18 18 18 18  '$ Temp: 98.3 F (36.8 C) 98.3 F (36.8 C) 98.5 F (36.9 C) (!) 97.4 F (36.3 C)  TempSrc:  Oral  Oral  SpO2:  96% 100% 95%  Weight:       Physical Exam General: Chronically ill appearing man, NAD. Room air.  Heart: RRR; no murmur Lungs: CTA anteriorly Abdomen: soft, non-tender. Still with some tense/pitting edema in flanks Extremities: 3+ tense pitting edema through thighs, heels wrapped Dialysis Access: R AVF + bruit  Additional Objective Labs: Basic Metabolic Panel: Recent Labs  Lab 10/08/20 0000 10/09/20 1322 10/10/20 0122  NA 134* 133* 134*  K 4.5 4.1 4.1  CL 95* 95* 96*  CO2 21* 24 23  GLUCOSE 272* 118* 83  BUN 44* 30* 36*  CREATININE 6.99* 5.31* 5.78*  CALCIUM 8.2* 8.7* 8.7*  PHOS  --   --  5.7*   Liver Function Tests: Recent Labs  Lab 10/07/20 1248 10/10/20 0122  AST 13*  --   ALT <5  --   ALKPHOS 427*  --   BILITOT 1.1  --   PROT 6.4*  --   ALBUMIN 1.8* 2.6*   CBC: Recent Labs  Lab 10/07/20 1248 10/08/20 0000 10/10/20 0122  WBC 10.1 9.1 10.8*  NEUTROABS 8.8*  --   --   HGB 9.0* 8.2* 8.8*  HCT 31.6* 28.9* 29.9*  MCV 100.0 101.8* 98.0  PLT 357 354 388   Iron Studies:  Recent Labs    10/07/20 1900  IRON 43*  TIBC 118*   Medications:  sodium chloride 10 mL/hr at 10/10/20 1101   [MAR Hold] albumin human 120 mL/hr at 10/10/20 0913    ceFAZolin (ANCEF) IV     [MAR Hold] piperacillin-tazobactam (ZOSYN)  IV Stopped (10/10/20 0650)   [MAR Hold] vancomycin      [MAR Hold] (feeding supplement) PROSource Plus  30 mL Oral BID BM   acetaminophen       [MAR Hold]  aspirin  325 mg Oral Daily   [MAR Hold] atorvastatin  10 mg Oral QPM   bisacodyl  10 mg Rectal Once   [MAR Hold] calcium acetate  1,334 mg Oral TID WC   [MAR Hold] Chlorhexidine Gluconate Cloth  6 each Topical Q0600   [MAR Hold] cinacalcet  30 mg Oral Q breakfast   [MAR Hold] collagenase   Topical Daily   [MAR Hold] cycloSPORINE  1 drop Both Eyes Daily   [MAR Hold] darbepoetin (ARANESP) injection - DIALYSIS  150 mcg Intravenous Q Thu-HD   [MAR Hold] gabapentin  300 mg Oral QHS   [MAR Hold] heparin  5,000 Units Subcutaneous Q8H   [MAR Hold] insulin aspart  0-6 Units Subcutaneous Q4H   [MAR Hold] midodrine  5 mg Oral Q T,Th,Sat-1800   [MAR Hold] sucroferric oxyhydroxide  500 mg Oral Q breakfast    Dialysis Orders: [facility] at [facility] --> need to clarify 3:45hr, 250dialyzer, 450/A1.5, EDW ~ 90kg, 2K/2.5Ca, AVF, heparin 6000 + 500/hr - Aranesp 135mg IV q Thur - Zemplar 911m IV q HD   Assessment/Plan: B heel wounds/osteomyelitis: Prev recommendation  was B BKA and had refused. After many discussions, now agreeable. For B BKA with Dr. Sharol Given on 7/22. Vol overload/Anasarca: S/p serial HD during last admit - continue process of volume correction while here. Continue large volume UFG with HD.  ESRD: Usual TTS sched, had to be bumped d/t large HD census - for HD later today.  Hypertension/volume: BP stable with significant anasarca. No antihypertensive meds - on mido '5mg'$  pre-HD only which can be escalated up if we run into issues with BP dropping on HD.  Anemia: Hgb 8.8 - will continue weekly Aranesp. Getting 1U PRBCs now pre-surgery.  Metabolic bone disease: Ca ok, Phos ok. Continue binders (Phoslo), sensipar.  Nutrition: Alb very low - continue supplements.  Sacral wound: Noted during last admit. Per primary.  Hx finger amputations, ischemic  HFrEF (25-30%): Perhaps will improve once volume corrected  Myoclonic jerking: Noted on exam 7/21 - felt likely d/t high dose gabapentin ('300mg'$   TID). Dose reduced, seems to have resolved today, stay on '300mg'$  QHS for now.    Veneta Penton, PA-C 10/10/2020, 11:27 AM  [facility]  I have seen and examined this patient and agree with plan and assessment in the above note with renal recommendations/intervention highlighted.  Governor Rooks Areanna Gengler,MD 10/10/2020 2:11 PM

## 2020-10-10 NOTE — Progress Notes (Signed)
Orthopedic Tech Progress Note Patient Details:  [name] [name] 1965/07/21 [id number] Ordered brace Patient ID: [name] [name], male   DOB: [birth year], 55 y.o.   MRN: [id number]  Ellouise Newer 10/10/2020, 9:49 PM

## 2020-10-10 NOTE — Anesthesia Procedure Notes (Signed)
Anesthesia Regional Block: Popliteal block   Pre-Anesthetic Checklist: , timeout performed,  Correct Patient, Correct Site, Correct Laterality,  Correct Procedure, Correct Position, site marked,  Risks and benefits discussed,  Pre-op evaluation,  At surgeon's request and post-op pain management  Laterality: Left  Prep: Maximum Sterile Barrier Precautions used, chloraprep       Needles:  Injection technique: Single-shot  Needle Type: Echogenic Stimulator Needle     Needle Length: 9cm  Needle Gauge: 22     Additional Needles:   Procedures:,,,, ultrasound used (permanent image in chart),,    Narrative:  Start time: 10/10/2020 1:30 PM End time: 10/10/2020 1:33 PM Injection made incrementally with aspirations every 5 mL.  Performed by: Personally  Anesthesiologist: Brennan Bailey, MD  Additional Notes: Risks, benefits, and alternative discussed. Patient gave consent for procedure. Patient prepped and draped in sterile fashion. Sedation administered, patient remains easily responsive to voice. Relevant anatomy identified with ultrasound guidance. Local anesthetic given in 5cc increments with no signs or symptoms of intravascular injection. No pain or paraesthesias with injection. Patient monitored throughout procedure with signs of LAST or immediate complications. Tolerated well. Ultrasound image placed in chart.  Tawny Asal, MD

## 2020-10-10 NOTE — Anesthesia Procedure Notes (Signed)
Anesthesia Regional Block: Popliteal block   Pre-Anesthetic Checklist: , timeout performed,  Correct Patient, Correct Site, Correct Laterality,  Correct Procedure, Correct Position, site marked,  Risks and benefits discussed,  Pre-op evaluation,  At surgeon's request and post-op pain management  Laterality: Right  Prep: Maximum Sterile Barrier Precautions used, chloraprep       Needles:  Injection technique: Single-shot  Needle Type: Echogenic Stimulator Needle     Needle Length: 9cm  Needle Gauge: 22     Additional Needles:   Procedures:,,,, ultrasound used (permanent image in chart),,    Narrative:  Start time: 10/10/2020 1:36 PM End time: 10/10/2020 1:40 PM Injection made incrementally with aspirations every 5 mL.  Performed by: Personally  Anesthesiologist: Brennan Bailey, MD  Additional Notes: Risks, benefits, and alternative discussed. Patient gave consent for procedure. Patient prepped and draped in sterile fashion. Sedation administered, patient remains easily responsive to voice. Relevant anatomy identified with ultrasound guidance. Local anesthetic given in 5cc increments with no signs or symptoms of intravascular injection. No pain or paraesthesias with injection. Patient monitored throughout procedure with signs of LAST or immediate complications. Tolerated well. Ultrasound image placed in chart.  Tawny Asal, MD

## 2020-10-10 NOTE — Anesthesia Postprocedure Evaluation (Signed)
Anesthesia Post Note  Patient: [name] [name]  Procedure(s) Performed: BILATERAL BELOW KNEE AMPUTATION (Bilateral: Knee) APPLICATION OF WOUND VAC (Bilateral: Knee)     Patient location during evaluation: PACU Anesthesia Type: General Level of consciousness: awake and alert and oriented Pain management: pain level controlled Vital Signs Assessment: post-procedure vital signs reviewed and stable Respiratory status: spontaneous breathing, nonlabored ventilation and respiratory function stable Cardiovascular status: blood pressure returned to baseline Postop Assessment: no apparent nausea or vomiting Anesthetic complications: no   No notable events documented.  Last Vitals:  Vitals:   10/10/20 1600 10/10/20 1616  BP:  140/81  Pulse:  (!) 102  Resp:  16  Temp: (!) 36.2 C 36.5 C  SpO2:  93%    Last Pain:  Vitals:   10/10/20 1616  TempSrc: Oral  PainSc: [name]

## 2020-10-10 NOTE — Interval H&P Note (Signed)
History and Physical Interval Note:  10/10/2020 6:41 AM  [name] [name]  has presented today for surgery, with the diagnosis of Bilateral Heel Abscess and Osteomyelitis.  The various methods of treatment have been discussed with the patient and family. After consideration of risks, benefits and other options for treatment, the patient has consented to  Procedure(s): BILATERAL BELOW KNEE AMPUTATION (Bilateral) as a surgical intervention.  The patient's history has been reviewed, patient examined, no change in status, stable for surgery.  I have reviewed the patient's chart and labs.  Questions were answered to the patient's satisfaction.     Newt Minion

## 2020-10-10 NOTE — Op Note (Signed)
   Date of Surgery: 10/10/2020  INDICATIONS: [name] is a 55 y.o.-year-old male who presents with chronic osteomyelitis of the calcaneus bilaterally with abscess ulceration bilateral feet.[name]  PREOPERATIVE DIAGNOSIS: Osteomyelitis abscess and ulceration bilateral feet.  POSTOPERATIVE DIAGNOSIS: Same.  PROCEDURE: Transtibial amputation, bilaterally Application of Prevena wound VAC, bilaterally  SURGEON: Sharol Given, M.D.  ANESTHESIA:  general  IV FLUIDS AND URINE: See anesthesia records.  ESTIMATED BLOOD LOSS: See anesthesia records.  COMPLICATIONS: None.  DESCRIPTION OF PROCEDURE: The patient was brought to the operating room after undergoing regional anesthetic. After adequate levels of anesthesia were obtained patient's bilateral lower extremities were prepped using DuraPrep draped into a sterile field. A timeout was called. The feet were draped out of the sterile field with impervious stockinette.  Attention was first focused on the left lower extremity.   A transverse incision was made 11 cm distal to the tibial tubercle. This curved proximally and a large posterior flap was created. The tibia was transected 1 cm proximal to the skin incision. The fibula was transected just proximal to the tibial incision. The tibia was beveled anteriorly. A large posterior flap was created. The sciatic nerve was pulled cut and allowed to retract. The vascular bundles were suture ligated with 2-0 silk. The deep and superficial fascial layers were closed using #1 Vicryl. The skin was closed using staples and 2-0 nylon. The wound was covered with a Prevena customizable and arthroform wound VAC.  The dressing was sealed with dermatac there was a good suction fit. A prosthetic shrinker and limb protector were applied.   Attention was then focused on the right lower extremity.  A transverse incision was made 11 cm distal to the tibial tubercle. This curved proximally and a large posterior flap was created.  The tibia was transected 1 cm proximal to the skin incision. The fibula was transected just proximal to the tibial incision. The tibia was beveled anteriorly. A large posterior flap was created. The sciatic nerve was pulled cut and allowed to retract. The vascular bundles were suture ligated with 2-0 silk. The deep and superficial fascial layers were closed using #1 Vicryl. The skin was closed using staples and 2-0 nylon. The wound was covered with a Prevena customizable and arthroform wound VAC.  The dressing was sealed with dermatac there was a good suction fit. A prosthetic shrinker and limb protector were applied.  Patient was taken to the PACU in stable condition.   DISCHARGE PLANNING:  Antibiotic duration: 24 hours  Weightbearing: Nonweightbearing on the operative extremity  Pain medication: Opioid pathway  Dressing care/ Wound VAC: Continue wound VAC for 1 week after discharge  Discharge to: Discharge planning based on therapy's recommendations for possible inpatient rehabilitation, outpatient rehabilitation, or discharge to home with therapy  Follow-up: In the office 1 week post operative.  Meridee Score, MD [place] 3:20 PM

## 2020-10-10 NOTE — Anesthesia Preprocedure Evaluation (Addendum)
Anesthesia Evaluation  Patient identified by MRN, date of birth, ID band Patient awake    Reviewed: Allergy & Precautions, NPO status , Patient's Chart, lab work & pertinent test results  History of Anesthesia Complications Negative for: history of anesthetic complications  Airway Mallampati: II  TM Distance: >3 FB Neck ROM: Full    Dental  (+) Poor Dentition   Pulmonary neg pulmonary ROS,    Pulmonary exam normal        Cardiovascular hypertension, + Peripheral Vascular Disease and +CHF  Normal cardiovascular exam  TTE 09/24/20: EF 25-30%, global hypokinesis, moderate LVE, moderately elevated PASP, mild LAE, mild RAE, mild to moderate MR, moderate TR   Neuro/Psych Depression negative neurological ROS     GI/Hepatic negative GI ROS, Neg liver ROS,   Endo/Other  diabetes, Type 2, Insulin Dependent  Renal/GU ESRF and DialysisRenal disease (HD T/Th/Sa)  negative genitourinary   Musculoskeletal negative musculoskeletal ROS (+)   Abdominal   Peds  Hematology  (+) anemia , Hgb 8.8   Anesthesia Other Findings Day of surgery medications reviewed with patient.  Reproductive/Obstetrics negative OB ROS                            Anesthesia Physical Anesthesia Plan  ASA: 4  Anesthesia Plan: General   Post-op Pain Management: GA combined w/ Regional for post-op pain   Induction: Intravenous  PONV Risk Score and Plan: 2 and Treatment may vary due to age or medical condition, Ondansetron, Dexamethasone and Midazolam  Airway Management Planned: LMA  Additional Equipment:   Intra-op Plan:   Post-operative Plan: Extubation in OR  Informed Consent: I have reviewed the patients History and Physical, chart, labs and discussed the procedure including the risks, benefits and alternatives for the proposed anesthesia with the patient or authorized representative who has indicated his/her understanding  and acceptance.     Dental advisory given  Plan Discussed with: CRNA  Anesthesia Plan Comments:       Anesthesia Quick Evaluation

## 2020-10-10 NOTE — Progress Notes (Signed)
Hypoglycemic Event   CBG: 79   Treatment: Dextrose 50% solution 12.5g   Symptoms: none   Follow-up CBG: 111 Time:   CBG Result:0734     Possible Reasons for Events: Inadequate meal intake   Comments/MD notified: Pt is NPO for OR; MD was notified.

## 2020-10-10 NOTE — Progress Notes (Signed)
Orthopedic Tech Progress Note Patient Details:  [name] [birth year] [id number] Called order into [facility]anger Patient ID: [name] [name], male   DOB: [birth year], 55 y.o.   MRN: [id number]  [name] [name] 10/10/2020, 8:38 AM

## 2020-10-10 NOTE — Transfer of Care (Signed)
Immediate Anesthesia Transfer of Care Note  Patient: [name] [name]  Procedure(s) Performed: BILATERAL BELOW KNEE AMPUTATION (Bilateral: Knee) APPLICATION OF WOUND VAC (Leg Upper)  Patient Location: PACU  Anesthesia Type:General and Regional  Level of Consciousness: sedated and patient cooperative  Airway & Oxygen Therapy: Patient Spontanous Breathing and Patient connected to face mask oxygen  Post-op Assessment: Report given to RN and Post -op Vital signs reviewed and stable  Post vital signs: Reviewed  Last Vitals:  Vitals Value Taken Time  BP    Temp    Pulse 103 10/10/20 1525  Resp 25 10/10/20 1525  SpO2 99 % 10/10/20 1525  Vitals shown include unvalidated device data.  Last Pain:  Vitals:   10/10/20 1050  TempSrc: Oral  PainSc: 9       Patients Stated Pain Goal: 1 ([id number])  Complications: No notable events documented.

## 2020-10-10 NOTE — Anesthesia Procedure Notes (Addendum)
Procedure Name: LMA Insertion Date/Time: 10/10/2020 2:19 PM Performed by: Jenne Campus, CRNA Pre-anesthesia Checklist: Patient identified, Emergency Drugs available, Suction available and Patient being monitored Patient Re-evaluated:Patient Re-evaluated prior to induction Oxygen Delivery Method: Circle System Utilized Preoxygenation: Pre-oxygenation with 100% oxygen Induction Type: IV induction Ventilation: Mask ventilation without difficulty LMA: LMA inserted LMA Size: 5.0 Number of attempts: 2 Airway Equipment and Method: Bite block Placement Confirmation: positive ETCO2 and breath sounds checked- equal and bilateral Tube secured with: Tape Dental Injury: Teeth and Oropharynx as per pre-operative assessment  Comments: Number 4 attempted with positive leak. Number 5 successful.

## 2020-10-10 NOTE — Plan of Care (Signed)
  Problem: Fluid Volume: Goal: Compliance with measures to maintain balanced fluid volume will improve Outcome: Progressing   Problem: Nutritional: Goal: Ability to make healthy dietary choices will improve Outcome: Progressing   Problem: Clinical Measurements: Goal: Complications related to the disease process, condition or treatment will be avoided or minimized Outcome: Progressing

## 2020-10-11 ENCOUNTER — Encounter (HOSPITAL_COMMUNITY): Payer: Self-pay | Admitting: Orthopedic Surgery

## 2020-10-11 DIAGNOSIS — M86679 Other chronic osteomyelitis, unspecified ankle and foot: Secondary | ICD-10-CM | POA: Diagnosis not present

## 2020-10-11 DIAGNOSIS — E11628 Type 2 diabetes mellitus with other skin complications: Secondary | ICD-10-CM | POA: Diagnosis not present

## 2020-10-11 DIAGNOSIS — S90811A Abrasion, right foot, initial encounter: Secondary | ICD-10-CM | POA: Diagnosis not present

## 2020-10-11 DIAGNOSIS — M8600 Acute hematogenous osteomyelitis, unspecified site: Secondary | ICD-10-CM | POA: Diagnosis not present

## 2020-10-11 LAB — TYPE AND SCREEN
ABO/RH(D): O POS
Antibody Screen: NEGATIVE
Unit division: 0

## 2020-10-11 LAB — BPAM RBC
Blood Product Expiration Date: 202208222359
ISSUE DATE / TIME: 202207220703
Unit Type and Rh: 5100

## 2020-10-11 LAB — RENAL FUNCTION PANEL
Albumin: 2.3 g/dL — ABNORMAL LOW (ref 3.5–5.0)
Anion gap: 17 — ABNORMAL HIGH (ref 5–15)
BUN: 23 mg/dL — ABNORMAL HIGH (ref 6–20)
CO2: 22 mmol/L (ref 22–32)
Calcium: 8.6 mg/dL — ABNORMAL LOW (ref 8.9–10.3)
Chloride: 96 mmol/L — ABNORMAL LOW (ref 98–111)
Creatinine, Ser: 4.26 mg/dL — ABNORMAL HIGH (ref 0.61–1.24)
GFR, Estimated: 16 mL/min — ABNORMAL LOW (ref >60)
Glucose, Bld: 113 mg/dL — ABNORMAL HIGH (ref 70–99)
Phosphorus: 5.5 mg/dL — ABNORMAL HIGH (ref 2.5–4.6)
Potassium: 4.1 mmol/L (ref 3.5–5.1)
Sodium: 135 mmol/L (ref 135–145)

## 2020-10-11 LAB — CBC
HCT: 38.2 % — ABNORMAL LOW (ref 39.0–52.0)
Hemoglobin: 11.6 g/dL — ABNORMAL LOW (ref 13.0–17.0)
MCH: 29 pg (ref 26.0–34.0)
MCHC: 30.4 g/dL (ref 30.0–36.0)
MCV: 95.5 fL (ref 80.0–100.0)
Platelets: 385 10*3/uL (ref 150–400)
RBC: 4 MIL/uL — ABNORMAL LOW (ref 4.22–5.81)
RDW: 17.2 % — ABNORMAL HIGH (ref 11.5–15.5)
WBC: 14 10*3/uL — ABNORMAL HIGH (ref 4.0–10.5)
nRBC: 0 % (ref 0.0–0.2)

## 2020-10-11 LAB — GLUCOSE, CAPILLARY: Glucose-Capillary: 117 mg/dL — ABNORMAL HIGH (ref 70–99)

## 2020-10-11 MED ORDER — POLYETHYLENE GLYCOL 3350 17 G PO PACK
17.0000 g | PACK | Freq: Every day | ORAL | Status: DC
  Administered 2020-10-11: 17 g via ORAL
  Filled 2020-10-11 (×2): qty 1

## 2020-10-11 MED ORDER — INSULIN GLARGINE 100 UNIT/ML ~~LOC~~ SOLN
5.0000 [IU] | Freq: Every day | SUBCUTANEOUS | Status: DC
  Administered 2020-10-11 – 2020-10-29 (×18): 5 [IU] via SUBCUTANEOUS
  Filled 2020-10-11 (×20): qty 0.05

## 2020-10-11 NOTE — Progress Notes (Signed)
[facility] Progress Note   Subjective:  Seen in room. Wants to hold off on HD today if possible.   Objective Vitals:   10/10/20 1937 10/10/20 2000 10/10/20 2106 10/11/20 0521  BP: (!) 149/82 (!) 166/86 134/78 134/76  Pulse: 95  89 90  Resp:   16 18  Temp:   (!) 97.5 F (36.4 C) 98.4 F (36.9 C)  TempSrc:   Oral Oral  SpO2:   98% 94%  Weight:       Physical Exam General: Chronically ill appearing man, NAD. Room air.  Heart: RRR; no murmur Lungs: CTA anteriorly Abdomen: soft, non-tender. Still with some tense/pitting edema in flanks Extremities: 3+ tense pitting edema through thighs, heels wrapped Dialysis Access: R AVF + bruit  Additional Objective Labs: Basic Metabolic Panel: Recent Labs  Lab 10/09/20 1322 10/10/20 0122 10/11/20 0140  NA 133* 134* 135  K 4.1 4.1 4.1  CL 95* 96* 96*  CO2 '24 23 22  '$ GLUCOSE 118* 83 113*  BUN 30* 36* 23*  CREATININE 5.31* 5.78* 4.26*  CALCIUM 8.7* 8.7* 8.6*  PHOS  --  5.7* 5.5*    Liver Function Tests: Recent Labs  Lab 10/07/20 1248 10/10/20 0122 10/11/20 0140  AST 13*  --   --   ALT <5  --   --   ALKPHOS 427*  --   --   BILITOT 1.1  --   --   PROT 6.4*  --   --   ALBUMIN 1.8* 2.6* 2.3*    CBC: Recent Labs  Lab 10/07/20 1248 10/08/20 0000 10/10/20 0122 10/11/20 0140  WBC 10.1 9.1 10.8* 14.0*  NEUTROABS 8.8*  --   --   --   HGB 9.0* 8.2* 8.8* 11.6*  HCT 31.6* 28.9* 29.9* 38.2*  MCV 100.0 101.8* 98.0 95.5  PLT 357 354 388 385    Iron Studies:  No results for input(s): IRON, TIBC, TRANSFERRIN, FERRITIN in the last 72 hours.  Medications:  sodium chloride 75 mL/hr at 10/10/20 1641   piperacillin-tazobactam (ZOSYN)  IV 2.25 g (10/11/20 0528)   tranexamic acid      (feeding supplement) PROSource Plus  30 mL Oral BID BM   vitamin C  1,000 mg Oral Daily   aspirin  325 mg Oral Daily   atorvastatin  10 mg Oral QPM   bisacodyl  10 mg Rectal Once   calcium acetate  1,334 mg Oral TID WC    Chlorhexidine Gluconate Cloth  6 each Topical Q0600   cinacalcet  30 mg Oral Q breakfast   collagenase   Topical Daily   cycloSPORINE  1 drop Both Eyes Daily   [START ON 10/16/2020] darbepoetin (ARANESP) injection - DIALYSIS  150 mcg Intravenous Q Thu-HD   docusate sodium  100 mg Oral Daily   gabapentin  300 mg Oral QHS   heparin  5,000 Units Subcutaneous Q8H   insulin aspart  0-6 Units Subcutaneous Q4H   midodrine  5 mg Oral Q T,Th,Sat-1800   nutrition supplement (JUVEN)  1 packet Oral BID BM   pantoprazole  40 mg Oral Daily   sucroferric oxyhydroxide  500 mg Oral Q breakfast   vancomycin variable dose per unstable renal function (pharmacist dosing)   Does not apply See admin instructions   zinc sulfate  220 mg Oral Daily    Dialysis Orders: TTS at [facility] --> need to clarify 3:45hr, 250dialyzer, 450/A1.5, EDW ~ 90kg, 2K/2.5Ca, AVF, heparin 6000 + 500/hr -  Aranesp 160mg IV q Thur - Zemplar 980m IV q HD   Assessment/Plan: B heel wounds/osteomyelitis: Prev recommendation was B BKA and had refused. After many discussions, now agreeable. SP B BKA with Dr. DuSharol Givenn 7/22. Vol overload/Anasarca: S/p serial HD during last admit - continue process of volume correction while here. Continue large volume UFG with HD.  ESRD: Usual TTS sched, had HD yesterday off schedule. Will plan on hold HD today, labs are good. Next HD Monday.   Hypertension/volume: BP stable with significant anasarca. No antihypertensive meds - on mido '5mg'$  pre-HD only which can be escalated up if we run into issues with BP dropping on HD.  Anemia: Hgb 8.8 - will continue weekly Aranesp. SP prbc's yesterday  Metabolic bone disease: Ca ok, Phos ok. Continue binders (Phoslo), sensipar.  Nutrition: Alb very low - continue supplements.  Sacral wound: Noted during last admit. Per primary.  Hx finger amputations, ischemic  HFrEF (25-30%): Perhaps will improve once volume corrected  Myoclonic jerking: Noted on exam 7/21 -  felt likely d/t high dose gabapentin ('300mg'$  TID). Dose reduced and symptoms resolved, cont '300mg'$  QHS for now.   RoKelly SplinterMD 10/11/2020, 9:40 AM

## 2020-10-11 NOTE — Evaluation (Signed)
Physical Therapy Evaluation Patient Details Name: DELFINO SEMO MRN: [id number] DOB: 1965-03-27 Today's Date: 10/11/2020   History of Present Illness  55 y/o male with history of IDDM, ESRD who was recently admitted with bilateral heel osteomyelitis and necrotic ulcers. He refused b/l BKA at that time. He was readmitted with worsening bilateral foot pain. He was seen by orthopedics and podiatry who are recommending bilateral bka. Patient is now S/P B BKA.   Clinical Impression  Patient received in bed, family present, but they left at beginning of session. Patient reports severe pain in B LEs. 9/10. He is cooperative, lethargic. Very slow to respond/answer questions. Patient required max+2 assist for bed mobility. He is able to sit edge of bed with close supervision. Unable to tolerate transfer this date due to pain. Patient will continue to benefit from skilled PT while here to improve functional independence and to reduce caregiver burden.     Follow Up Recommendations CIR    Equipment Recommendations  None recommended by PT    Recommendations for Other Services Rehab consult     Precautions / Restrictions Precautions Precautions: Fall Other Brace: B Limb protectors Restrictions Weight Bearing Restrictions: Yes RLE Weight Bearing: Non weight bearing LLE Weight Bearing: Non weight bearing      Mobility  Bed Mobility Overal bed mobility: Needs Assistance Bed Mobility: Supine to Sit;Sit to Supine     Supine to sit: Max assist;+2 for physical assistance Sit to supine: Max assist;+2 for physical assistance        Transfers                 General transfer comment: uanble to attempt this session  Ambulation/Gait             General Gait Details: unable  Stairs            Wheelchair Mobility    Modified Rankin (Stroke Patients Only)       Balance Overall balance assessment: Needs assistance Sitting-balance support: Bilateral upper extremity  supported;Feet unsupported Sitting balance-Leahy Scale: Poor Sitting balance - Comments: requires close supervision while sitting edge of bed. Required assistance to get safely positioned at edge of bed.                                     Pertinent Vitals/Pain Pain Assessment: 0-10 Pain Score: 9  Pain Location: B LEs Pain Descriptors / Indicators: Discomfort;Grimacing;Shooting Pain Intervention(s): Monitored during session;Repositioned    Home Living Family/patient expects to be discharged to:: Private residence Living Arrangements: Parent Available Help at Discharge: Family;Available 24 hours/day Type of Home: House Home Access: Stairs to enter   [employer] of Steps: stated he has stair lift going into house from garage Home Layout: Two level;Full bath on main level;Able to live on main level with bedroom/bathroom Home Equipment: Kasandra Knudsen - single point;Shower seat;Walker - 2 wheels;Hand held shower head;Adaptive equipment;Wheelchair - manual Additional Comments: pt is lethargic and confused, gave conflicting information.  Very slow to respond. Slow processing.    Prior Function Level of Independence: Needs assistance   Gait / Transfers Assistance Needed: patient states he was getting around well prior to this at home. Due to patient having wounds on his back and bottom, does not seem to add up.  ADL's / Homemaking Assistance Needed: mother handles cooking and cleaning, helps with pt's self care        Hand Dominance  Dominant Hand: Right    Extremity/Trunk Assessment   Upper Extremity Assessment Upper Extremity Assessment: Generalized weakness    Lower Extremity Assessment Lower Extremity Assessment: RLE deficits/detail;LLE deficits/detail RLE: Unable to fully assess due to pain RLE Coordination: decreased gross motor LLE: Unable to fully assess due to pain LLE Coordination: decreased gross motor       Communication   Communication: No  difficulties  Cognition Arousal/Alertness: Awake/alert;Lethargic Behavior During Therapy: Flat affect Overall Cognitive Status: No family/caregiver present to determine baseline cognitive functioning Area of Impairment: Following commands;Awareness;Problem solving                       Following Commands: Follows one step commands with increased time Safety/Judgement: Decreased awareness of safety;Decreased awareness of deficits Awareness: Intellectual Problem Solving: Slow processing;Requires verbal cues;Requires tactile cues;Decreased initiation        General Comments      Exercises     Assessment/Plan    PT Assessment Patient needs continued PT services  PT Problem List Decreased strength;Decreased mobility;Decreased activity tolerance;Decreased balance;Decreased safety awareness;Decreased knowledge of precautions;Decreased knowledge of use of DME;Decreased cognition;Pain;Decreased range of motion       PT Treatment Interventions DME instruction;Functional mobility training;Therapeutic activities;Therapeutic exercise;Balance training;Neuromuscular re-education;Patient/family education;Wheelchair mobility training    PT Goals (Current goals can be found in the Care Plan section)  Acute Rehab PT Goals Patient Stated Goal: to decrease pain PT Goal Formulation: With patient Time For Goal Achievement: 10/24/20 Potential to Achieve Goals: Fair    Frequency Min 3X/week   Barriers to discharge Decreased caregiver support;Inaccessible home environment      Co-evaluation               AM-PAC PT "6 Clicks" Mobility  Outcome Measure Help needed turning from your back to your side while in a flat bed without using bedrails?: A Lot Help needed moving from lying on your back to sitting on the side of a flat bed without using bedrails?: A Lot Help needed moving to and from a bed to a chair (including a wheelchair)?: Total Help needed standing up from a chair using  your arms (e.g., wheelchair or bedside chair)?: Total Help needed to walk in hospital room?: Total Help needed climbing 3-5 steps with a railing? : Total 6 Click Score: 8    End of Session Equipment Utilized During Treatment: Other (comment) (B limb protectors, wound vacs) Activity Tolerance: Patient limited by pain Patient left: in bed;with call bell/phone within reach Nurse Communication: Mobility status;Patient requests pain meds PT Visit Diagnosis: Other abnormalities of gait and mobility (R26.89);Pain Pain - Right/Left: Right (bilateral) Pain - part of body: Leg    Time: 1303-1330 PT Time Calculation (min) (ACUTE ONLY): 27 min   Charges:   PT Evaluation $PT Eval Moderate Complexity: 1 Mod          Tashica Provencio, PT, GCS 10/11/20,2:29 PM

## 2020-10-11 NOTE — Progress Notes (Signed)
Patient ID: [name] [name], male   DOB: 1965/08/30, 55 y.o.   MRN: [id number] Patient is postoperative day 1 bilateral transtibial amputations.  There is no drainage in either wound VAC canister there is a good suction fit.  Anticipate discharge to inpatient versus outpatient rehab.

## 2020-10-11 NOTE — Evaluation (Signed)
Occupational Therapy Evaluation Patient Details Name: [name] [name] MRN: [id number] DOB: Dec 11, 1965 Today's Date: 10/11/2020    History of Present Illness 55 y/o male with history of IDDM, ESRD who was recently admitted with bilateral heel osteomyelitis and necrotic ulcers. He refused b/l BKA at that time. He was readmitted with worsening bilateral foot pain. He was seen by orthopedics and podiatry who are recommending bilateral bka. Patient is now S/P B BKA.   Clinical Impression   Pt admitted for concerns and procedure listed above. PTA pt reported that he was independent with ADL's but sometimes got tired and weak, causing him to need assist from his mom. At this time pt presented with increased pain and weakness, requiring max A +2 for bed mobility. Once seated EOB, pt was able to remain upright for 3 mins with supervision for seated balance. Pt would be an excellent candidate for CIR due to his motivation to improve his independence and his deficits. OT will follow acutely.    Follow Up Recommendations  CIR;Supervision/Assistance - 24 hour    Equipment Recommendations  Other (comment) (TBD)    Recommendations for Other Services Rehab consult     Precautions / Restrictions Precautions Precautions: Fall Other Brace: B Limb protectors Restrictions Weight Bearing Restrictions: Yes RLE Weight Bearing: Non weight bearing LLE Weight Bearing: Non weight bearing      Mobility Bed Mobility Overal bed mobility: Needs Assistance Bed Mobility: Supine to Sit;Sit to Supine     Supine to sit: Max assist;+2 for physical assistance Sit to supine: Max assist;+2 for physical assistance        Transfers                 General transfer comment: uanble to attempt this session    Balance Overall balance assessment: Needs assistance Sitting-balance support: Bilateral upper extremity supported;Feet unsupported Sitting balance-Leahy Scale: Poor Sitting balance - Comments:  requires close supervision while sitting edge of bed. Required assistance to get safely positioned at edge of bed.                                   ADL either performed or assessed with clinical judgement   ADL Overall ADL's : Needs assistance/impaired Eating/Feeding: Set up;Bed level   Grooming: Set up;Bed level   Upper Body Bathing: Minimal assistance;Bed level   Lower Body Bathing: Maximal assistance;Bed level   Upper Body Dressing : Minimal assistance;Bed level   Lower Body Dressing: Maximal assistance;Bed level   Toilet Transfer: Total assistance;+2 for physical assistance;+2 for safety/equipment;Transfer board   Toileting- Clothing Manipulation and Hygiene: Maximal assistance;Bed level   Tub/ Shower Transfer: Total assistance;+2 for physical assistance;+2 for safety/equipment           Vision Baseline Vision/History: No visual deficits Patient Visual Report: No change from baseline Vision Assessment?: No apparent visual deficits     Perception Perception Perception Tested?: No   Praxis Praxis Praxis tested?: Not tested    Pertinent Vitals/Pain Pain Assessment: 0-10 Pain Score: 9  Pain Location: B LEs Pain Descriptors / Indicators: Discomfort;Grimacing;Shooting Pain Intervention(s): Limited activity within patient's tolerance;Monitored during session;Repositioned;Patient requesting pain meds-RN notified     Hand Dominance Right   Extremity/Trunk Assessment Upper Extremity Assessment Upper Extremity Assessment: Generalized weakness   Lower Extremity Assessment Lower Extremity Assessment: Defer to PT evaluation RLE: Unable to fully assess due to pain RLE Coordination: decreased gross motor LLE: Unable to fully  assess due to pain LLE Coordination: decreased gross motor       Communication Communication Communication: No difficulties   Cognition Arousal/Alertness: Lethargic Behavior During Therapy: Flat affect Overall Cognitive  Status: No family/caregiver present to determine baseline cognitive functioning Area of Impairment: Following commands;Awareness;Problem solving                       Following Commands: Follows one step commands with increased time Safety/Judgement: Decreased awareness of safety;Decreased awareness of deficits Awareness: Intellectual Problem Solving: Slow processing;Requires verbal cues;Requires tactile cues;Decreased initiation     General Comments  Pt increased pain in back from wounds    Exercises     Shoulder Instructions      Home Living Family/patient expects to be discharged to:: Private residence Living Arrangements: Parent Available Help at Discharge: Family;Available 24 hours/day Type of Home: House Home Access: Stairs to enter [employer] of Steps: stated he has stair lift going into house from garage   Home Layout: Two level;Full bath on main level;Able to live on main level with bedroom/bathroom Alternate Level Stairs-Number of Steps: 14 Alternate Level Stairs-Rails: Right Bathroom Shower/Tub: Occupational psychologist: Standard Bathroom Accessibility: Yes How Accessible: Accessible via walker Home Equipment: [employer] - single point;Shower seat;Walker - 2 wheels;Hand held shower head;Adaptive equipment;Wheelchair - Higher education careers adviser: [employer] Additional Comments: pt is lethargic and confused, gave conflicting information.  Very slow to respond. Slow processing.      Prior Functioning/Environment Level of Independence: Needs assistance  Gait / Transfers Assistance Needed: patient states he was getting around well prior to this at home. Due to patient having wounds on his back and bottom, does not seem to add up. ADL's / Homemaking Assistance Needed: mother handles cooking and cleaning, helps with pt's self care            OT Problem List: Decreased strength;Decreased activity tolerance;Impaired balance (sitting and/or  standing);Decreased coordination;Decreased safety awareness;Decreased knowledge of use of DME or AE;Pain      OT Treatment/Interventions: Self-care/ADL training;Therapeutic exercise;Energy conservation;DME and/or AE instruction;Therapeutic activities;Patient/family education;Balance training    OT Goals(Current goals can be found in the care plan section) Acute Rehab OT Goals Patient Stated Goal: to decrease pain OT Goal Formulation: With patient Time For Goal Achievement: 10/25/20 Potential to Achieve Goals: Good ADL Goals Pt Will Perform Grooming: with modified independence;sitting Pt Will Perform Lower Body Bathing: with min assist;sitting/lateral leans Pt Will Perform Lower Body Dressing: with min guard assist;sitting/lateral leans Pt Will Transfer to Toilet: with transfer board;with mod assist Additional ADL Goal #1: Pt will tolerate sitting EOB for 5 mins to completed seated ADL's  OT Frequency: Min 2X/week   Barriers to D/C:            Co-evaluation PT/OT/SLP Co-Evaluation/Treatment: Yes Reason for Co-Treatment: Complexity of the patient's impairments (multi-system involvement);For patient/therapist safety;To address functional/ADL transfers PT goals addressed during session: Mobility/safety with mobility;Balance OT goals addressed during session: ADL's and self-care      AM-PAC OT "6 Clicks" Daily Activity     Outcome Measure Help from another person eating meals?: A Little Help from another person taking care of personal grooming?: A Little Help from another person toileting, which includes using toliet, bedpan, or urinal?: Total Help from another person bathing (including washing, rinsing, drying)?: A Lot Help from another person to put on and taking off regular upper body clothing?: A Little Help from another person to put on and taking off regular lower  body clothing?: A Lot 6 Click Score: 14   End of Session Nurse Communication: Mobility status;Patient requests  pain meds  Activity Tolerance: Patient limited by pain;Patient limited by lethargy Patient left: in bed;with call bell/phone within reach;with bed alarm set  OT Visit Diagnosis: Other abnormalities of gait and mobility (R26.89);Muscle weakness (generalized) (M62.81);Pain Pain - Right/Left:  (Both) Pain - part of body: Leg                Time: 1303-1330 OT Time Calculation (min): 27 min Charges:  OT General Charges $OT Visit: 1 Visit OT Evaluation $OT Eval Moderate Complexity: 1 Mod OT Treatments $Therapeutic Activity: 8-22 mins  Owais Pruett H., OTR/L Acute Rehabilitation  Logen Fowle Elane Sundy Houchins 10/11/2020, 4:18 PM

## 2020-10-11 NOTE — Progress Notes (Signed)
Inpatient Rehab Admissions:  Inpatient Rehab Consult received.  I met with patient at the bedside for rehabilitation assessment and to discuss goals and expectations of an inpatient rehab admission.  Pt was lethargic but able to acknowledge understanding of goals and expectations. Pt interested in pursuing CIR.  Pt gave permission to contact mother, Nellie. Pt's mother requested I speak with pt's brother Elberta Fortis to explain CIR goals and expectations. Elberta Fortis acknowledged understanding. Nellie and Elberta Fortis would like some time to discuss disposition and pt pursuing CIR.  Will f/u at later date per family request.    Signed: Gayland Curry, [place], [place] Admissions Coordinator [phone]

## 2020-10-11 NOTE — Progress Notes (Addendum)
PROGRESS NOTE    [name] [name]  [id number] DOB: October 16, 1965 DOA: 10/07/2020 PCP: [name], MD    Brief Narrative:  55 y/o male with history of IDDM, ESRD who was recently admitted with bilateral heel osteomyelitis and necrotic ulcers. He refused b/l BKA at that time. He was readmitted with worsening bilateral foot pain. He was seen by orthopedics and podiatry who are recommending bilateral bka.    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 ([name])   Chronic osteomyelitis of foot ([name])   Cutaneous abscess of foot   Bilateral heel osteomyelitis/abscess ulceration and necrosis -was recommended to have b/l BKA during last admission but refused -patient had been receiving IV cefepime as outpatient at his dialysis center  -it appears he was also receiving doxycycline and flagyl -readmitted for worsening pain  -restarted on IV antibiotics, vancomycin and zosyn -seen by podiatry and orthopedics, both recommending b/l BKA -s/p B/L BKA on 7/22 -He has wound vac on both stumps -further post op care per ortho  ESRD -TTS -seen by nephrology -last HD session on 7/22  Lethargy -possibly medication related -gabapentin dose reduced -change oxycodone to prn -ammonia not significantly elevated -mental status improving  Pressure injuries Pressure Injury 10/08/20 Vertebral column Lower Unstageable - Full thickness tissue loss in which the base of the injury is covered by slough (yellow, tan, gray, green or brown) and/or eschar (tan, brown or black) in the wound bed. (Active)  10/08/20 0100  Location: Vertebral column  Location Orientation: Lower  Staging: Unstageable - Full thickness tissue loss in which the base of the injury is covered by slough (yellow, tan, gray, green or brown) and/or eschar (tan, brown or black) in the wound bed.  Wound Description (Comments):   Present on Admission: Yes      Pressure Injury 10/08/20 Coccyx Unstageable - Full thickness tissue loss in which the base of the injury is covered by slough (yellow, tan, gray, green or brown) and/or eschar (tan, brown or black) in the wound bed. (Active)  10/08/20 0100  Location: Coccyx  Location Orientation:   Staging: Unstageable - Full thickness tissue loss in which the base of the injury is covered by slough (yellow, tan, gray, green or brown) and/or eschar (tan, brown or black) in the wound bed.  Wound Description (Comments):   Present on Admission: Yes     Pressure Injury Buttocks (Active)     Location: Buttocks  Location Orientation:   Staging:   Wound Description (Comments):   Present on Admission:      Pressure Injury Buttocks Bilateral Unstageable - Full thickness tissue loss in which the base of the injury is covered by slough (yellow, tan, gray, green or brown) and/or eschar (tan, brown or black) in the wound bed. (Active)     Location: Buttocks  Location Orientation: Bilateral  Staging: Unstageable - Full thickness tissue loss in which the base of the injury is covered by slough (yellow, tan, gray, green or brown) and/or eschar (tan, brown or black) in the wound bed.  Wound Description (Comments):   Present on Admission: Yes     Pressure Injury Thigh Posterior;Proximal;Right Unstageable - Full thickness tissue loss in which the base of the injury is covered by slough (yellow, tan, gray, green or brown) and/or eschar (tan, brown or black) in the wound bed. (Active)     Location: Thigh  Location Orientation: Posterior;Proximal;Right  Staging: Unstageable - Full  thickness tissue loss in which the base of the injury is covered by slough (yellow, tan, gray, green or brown) and/or eschar (tan, brown or black) in the wound bed.  Wound Description (Comments):   Present on Admission: Yes  -appreciate WOC recommendations  Anasarca -patient is hypoalbuminemic  -nutrition to follow  IDDM -chronically on  insulin pump -will hold for now and start on lantus/SSI -He was having hypoglycemic episodes prior to surgery, but blood sugars appear to be trending up today -We will resume Lantus  Anemia of chronic disease -likely related to renal disease -his chronic foot infections likely also contributing -transfused 1 unit prbc preoperatively in anticipation for surgery -Posttransfusion hemoglobin improved to 11.6, continue to follow  Acute on chronic systolic CHF -EF [id number] -Continues have evidence of volume overload -volume management with dialysis -suspect that hypoalbuminemia also contributing to anasarca  Constipation -will order suppository and continue on MiraLAX     DVT prophylaxis: heparin injection 5,000 Units Start: 10/07/20 2200  Code Status: full code Family Communication: discussed with mother and brother at bedside Disposition Plan: Status is: Inpatient  Remains inpatient appropriate because:Ongoing active pain requiring inpatient pain management, Ongoing diagnostic testing needed not appropriate for outpatient work up, IV treatments appropriate due to intensity of illness or inability to take PO, and Inpatient level of care appropriate due to severity of illness  Dispo: The patient is from: Home              Anticipated d/c is to:  TBD              Patient currently is medically stable to d/c.   Difficult to place patient No         Consultants:  Orthopedics, Dr. Sharol Given Nephrology Podiatry, Dr. Jacqualyn Posey  Procedures:  7/22 bilateral BKA  Antimicrobials:  Vancomycin 7/20> Zosyn 7/20>    Subjective: Complains of pain in bilateral incision sites.  He has not had a bowel movement as of yet.  Objective: Vitals:   10/10/20 2000 10/10/20 2106 10/11/20 0521 10/11/20 0952  BP: (!) 166/86 134/78 134/76 126/85  Pulse:  89 90 95  Resp:  '16 18 17  '$ Temp:  (!) 97.5 F (36.4 C) 98.4 F (36.9 C) 97.9 F (36.6 C)  TempSrc:  Oral Oral Oral  SpO2:  98% 94% 100%   Weight:        Intake/Output Summary (Last 24 hours) at 10/11/2020 1352 Last data filed at 10/11/2020 0730 Gross per 24 hour  Intake 808.32 ml  Output 4050 ml  Net -3241.68 ml   Filed Weights   10/08/20 0730 10/08/20 1148 10/10/20 0611  Weight: 95.8 kg 91 kg 91 kg    Examination:  General exam: Alert, awake, oriented x 3 Respiratory system: Clear to auscultation. Respiratory effort normal. Cardiovascular system:RRR. No murmurs, rubs, gallops. Gastrointestinal system: Abdomen is nondistended, soft and nontender. No organomegaly or masses felt. Normal bowel sounds heard. Central nervous system: Alert and oriented. No focal neurological deficits. Extremities: Bilateral BKA with bilateral wound VAC Skin: No rashes, lesions or ulcers Psychiatry: Judgement and insight appear normal. Mood & affect appropriate.      Data Reviewed: I have personally reviewed following labs and imaging studies  CBC: Recent Labs  Lab 10/07/20 1248 10/08/20 0000 10/10/20 0122 10/11/20 0140  WBC 10.1 9.1 10.8* 14.0*  NEUTROABS 8.8*  --   --   --   HGB 9.0* 8.2* 8.8* 11.6*  HCT 31.6* 28.9* 29.9* 38.2*  MCV 100.0 101.8* 98.0  95.5  PLT 357 354 388 0000000   Basic Metabolic Panel: Recent Labs  Lab 10/07/20 1248 10/08/20 0000 10/09/20 1322 10/10/20 0122 10/11/20 0140  NA 132* 134* 133* 134* 135  K 5.3* 4.5 4.1 4.1 4.1  CL 94* 95* 95* 96* 96*  CO2 26 21* '24 23 22  '$ GLUCOSE 366* 272* 118* 83 113*  BUN 40* 44* 30* 36* 23*  CREATININE 6.98* 6.99* 5.31* 5.78* 4.26*  CALCIUM 8.3* 8.2* 8.7* 8.7* 8.6*  PHOS  --   --   --  5.7* 5.5*   GFR: Estimated Creatinine Clearance: 20.6 mL/min (A) (by C-G formula based on SCr of 4.26 mg/dL (H)). Liver Function Tests: Recent Labs  Lab 10/07/20 1248 10/10/20 0122 10/11/20 0140  AST 13*  --   --   ALT <5  --   --   ALKPHOS 427*  --   --   BILITOT 1.1  --   --   PROT 6.4*  --   --   ALBUMIN 1.8* 2.6* 2.3*   No results for input(s): LIPASE, AMYLASE in  the last 168 hours. Recent Labs  Lab 10/09/20 1607  AMMONIA 38*   Coagulation Profile: Recent Labs  Lab 10/07/20 1248  INR 1.3*   Cardiac Enzymes: No results for input(s): CKTOTAL, CKMB, CKMBINDEX, TROPONINI in the last 168 hours. BNP (last 3 results) No results for input(s): PROBNP in the last 8760 hours. HbA1C: No results for input(s): HGBA1C in the last 72 hours. CBG: Recent Labs  Lab 10/07/20 1208 10/08/20 0155 10/08/20 0641 10/10/20 1546 10/11/20 0406  GLUCAP 356* 250* 224* 97 117*   Lipid Profile: No results for input(s): CHOL, HDL, LDLCALC, TRIG, CHOLHDL, LDLDIRECT in the last 72 hours. Thyroid Function Tests: No results for input(s): TSH, T4TOTAL, FREET4, T3FREE, THYROIDAB in the last 72 hours. Anemia Panel: No results for input(s): VITAMINB12, FOLATE, FERRITIN, TIBC, IRON, RETICCTPCT in the last 72 hours.  Sepsis Labs: Recent Labs  Lab 10/07/20 1248 10/07/20 2033  LATICACIDVEN 2.2* 2.6*    Recent Results (from the past 240 hour(s))  Culture, blood (Routine x 2)     Status: None (Preliminary result)   Collection Time: 10/07/20  1:06 PM   Specimen: BLOOD  Result Value Ref Range Status   Specimen Description BLOOD LEFT ANTECUBITAL  Final   Special Requests   Final    BOTTLES DRAWN AEROBIC AND ANAEROBIC Blood Culture results may not be optimal due to an inadequate volume of blood received in culture bottles   Culture   Final    NO GROWTH 4 DAYS Performed at [facility] [address] [place], [place] [address]    Report Status PENDING  Incomplete  SARS CORONAVIRUS 2 (TAT 6-24 HRS) Nasopharyngeal Nasopharyngeal Swab     Status: None   Collection Time: 10/07/20  4:39 PM   Specimen: Nasopharyngeal Swab  Result Value Ref Range Status   SARS Coronavirus 2 NEGATIVE NEGATIVE Final    Comment: (NOTE) SARS-CoV-2 target nucleic acids are NOT DETECTED.  The SARS-CoV-2 RNA is generally detectable in upper and lower respiratory specimens during the  acute phase of infection. Negative results do not preclude SARS-CoV-2 infection, do not rule out co-infections with other pathogens, and should not be used as the sole basis for treatment or other patient management decisions. Negative results must be combined with clinical observations, patient history, and epidemiological information. The expected result is Negative.  Fact Sheet for Patients: SugarRoll.be  Fact Sheet for Healthcare Providers: https://www.woods-mathews.com/  This test is not yet approved or cleared by the [place] and  has been authorized for detection and/or diagnosis of SARS-CoV-2 by FDA under an Emergency Use Authorization (EUA). This EUA will remain  in effect (meaning this test can be used) for the duration of the COVID-19 declaration under Se ction 564(b)(1) of the Act, 21 U.S.C. section 360bbb-3(b)(1), unless the authorization is terminated or revoked sooner.  Performed at [facility], [place] [address] [place], [place] [address]   Culture, blood (Routine x 2)     Status: None (Preliminary result)   Collection Time: 10/07/20  8:07 PM   Specimen: BLOOD LEFT HAND  Result Value Ref Range Status   Specimen Description BLOOD LEFT HAND  Final   Special Requests   Final    BOTTLES DRAWN AEROBIC AND ANAEROBIC Blood Culture adequate volume   Culture   Final    NO GROWTH 4 DAYS Performed at [facility], [place] [address], [place], [place] [address]    Report Status PENDING  Incomplete  Culture, blood (single)     Status: None (Preliminary result)   Collection Time: 10/08/20  4:04 AM   Specimen: BLOOD  Result Value Ref Range Status   Specimen Description BLOOD SITE NOT SPECIFIED  Final   Special Requests   Final    BOTTLES DRAWN AEROBIC AND ANAEROBIC Blood Culture adequate volume   Culture   Final    NO GROWTH 3 DAYS Performed at [facility], [address], [place], [place] [address]     Report Status PENDING  Incomplete  Surgical pcr screen     Status: None   Collection Time: 10/10/20  9:12 AM   Specimen: Nasal Mucosa; Nasal Swab  Result Value Ref Range Status   MRSA, PCR NEGATIVE NEGATIVE Final   Staphylococcus aureus NEGATIVE NEGATIVE Final    Comment: (NOTE) The Xpert SA Assay (FDA approved for NASAL specimens in patients 85 years of age and older), is one component of a comprehensive surveillance program. It is not intended to diagnose infection nor to guide or monitor treatment. Performed at [facility], [place] [address], [place], [place] [address]          Radiology Studies: No results found.      Scheduled Meds:  (feeding supplement) PROSource Plus  30 mL Oral BID BM   vitamin C  1,000 mg Oral Daily   aspirin  325 mg Oral Daily   atorvastatin  10 mg Oral QPM   bisacodyl  10 mg Rectal Once   calcium acetate  1,334 mg Oral TID WC   Chlorhexidine Gluconate Cloth  6 each Topical Q0600   cinacalcet  30 mg Oral Q breakfast   collagenase   Topical Daily   cycloSPORINE  1 drop Both Eyes Daily   [START ON 10/16/2020] darbepoetin (ARANESP) injection - DIALYSIS  150 mcg Intravenous Q Thu-HD   docusate sodium  100 mg Oral Daily   gabapentin  300 mg Oral QHS   heparin  5,000 Units Subcutaneous Q8H   insulin aspart  0-6 Units Subcutaneous Q4H   midodrine  5 mg Oral Q T,Th,Sat-1800   nutrition supplement (JUVEN)  1 packet Oral BID BM   pantoprazole  40 mg Oral Daily   sucroferric oxyhydroxide  500 mg Oral Q breakfast   zinc sulfate  220 mg Oral Daily   Continuous Infusions:  sodium chloride 75 mL/hr at 10/10/20 1641   piperacillin-tazobactam (ZOSYN)  IV 2.25 g (10/11/20 0528)   tranexamic acid       LOS: 4 days    Time spent: 87mns    JKathie Dike MD [facility]   If 7PM-7AM, please contact night-coverage [email or web address]  10/11/2020, 1:52 PM

## 2020-10-12 DIAGNOSIS — S90811A Abrasion, right foot, initial encounter: Secondary | ICD-10-CM | POA: Diagnosis not present

## 2020-10-12 DIAGNOSIS — E11628 Type 2 diabetes mellitus with other skin complications: Secondary | ICD-10-CM | POA: Diagnosis not present

## 2020-10-12 DIAGNOSIS — M86679 Other chronic osteomyelitis, unspecified ankle and foot: Secondary | ICD-10-CM | POA: Diagnosis not present

## 2020-10-12 DIAGNOSIS — M8600 Acute hematogenous osteomyelitis, unspecified site: Secondary | ICD-10-CM | POA: Diagnosis not present

## 2020-10-12 LAB — CULTURE, BLOOD (ROUTINE X 2)
Culture: NO GROWTH
Culture: NO GROWTH
Special Requests: ADEQUATE

## 2020-10-12 LAB — CBC
HCT: 36.1 % — ABNORMAL LOW (ref 39.0–52.0)
Hemoglobin: 10.8 g/dL — ABNORMAL LOW (ref 13.0–17.0)
MCH: 28.7 pg (ref 26.0–34.0)
MCHC: 29.9 g/dL — ABNORMAL LOW (ref 30.0–36.0)
MCV: 96 fL (ref 80.0–100.0)
Platelets: 433 10*3/uL — ABNORMAL HIGH (ref 150–400)
RBC: 3.76 MIL/uL — ABNORMAL LOW (ref 4.22–5.81)
RDW: 17 % — ABNORMAL HIGH (ref 11.5–15.5)
WBC: 13.8 10*3/uL — ABNORMAL HIGH (ref 4.0–10.5)
nRBC: 0 % (ref 0.0–0.2)

## 2020-10-12 LAB — BASIC METABOLIC PANEL
Anion gap: 15 (ref 5–15)
BUN: 42 mg/dL — ABNORMAL HIGH (ref 6–20)
CO2: 23 mmol/L (ref 22–32)
Calcium: 8.1 mg/dL — ABNORMAL LOW (ref 8.9–10.3)
Chloride: 96 mmol/L — ABNORMAL LOW (ref 98–111)
Creatinine, Ser: 5.23 mg/dL — ABNORMAL HIGH (ref 0.61–1.24)
GFR, Estimated: 12 mL/min — ABNORMAL LOW (ref >60)
Glucose, Bld: 186 mg/dL — ABNORMAL HIGH (ref 70–99)
Potassium: 4 mmol/L (ref 3.5–5.1)
Sodium: 134 mmol/L — ABNORMAL LOW (ref 135–145)

## 2020-10-12 LAB — GLUCOSE, CAPILLARY: Glucose-Capillary: 126 mg/dL — ABNORMAL HIGH (ref 70–99)

## 2020-10-12 MED ORDER — OXYCODONE HCL 5 MG PO TABS
5.0000 mg | ORAL_TABLET | Freq: Four times a day (QID) | ORAL | Status: DC | PRN
  Administered 2020-10-13 – 2020-10-29 (×50): 5 mg via ORAL
  Filled 2020-10-12 (×50): qty 1

## 2020-10-12 MED ORDER — OXYCODONE-ACETAMINOPHEN 5-325 MG PO TABS
1.0000 | ORAL_TABLET | Freq: Four times a day (QID) | ORAL | Status: DC | PRN
  Administered 2020-10-13 – 2020-10-29 (×50): 1 via ORAL
  Filled 2020-10-12 (×50): qty 1

## 2020-10-12 NOTE — Progress Notes (Signed)
[facility] Progress Note   Subjective:  Seen in room, pain a little better. No SOB or cough.   Objective Vitals:   10/11/20 2050 10/12/20 0525 10/12/20 0955 10/12/20 1721  BP: 129/76 116/69 116/73 100/86  Pulse: (!) 102 100 94 97  Resp: '14 16 17 18  '$ Temp: 97.9 F (36.6 C) (!) 97.4 F (36.3 C) 97.6 F (36.4 C) 98 F (36.7 C)  TempSrc: Oral Oral Oral Oral  SpO2: 90% 100% 99% 98%  Weight:       Physical Exam General: Chronically ill appearing man, NAD. Room air.  Heart: RRR; no murmur Lungs: CTA anteriorly Abdomen: soft, non-tender Extremities: 1-2 + bilat hip edema Dialysis Access: R AVF + bruit  Additional Objective Labs: Basic Metabolic Panel: Recent Labs  Lab 10/10/20 0122 10/11/20 0140 10/12/20 0330  NA 134* 135 134*  K 4.1 4.1 4.0  CL 96* 96* 96*  CO2 '23 22 23  '$ GLUCOSE 83 113* 186*  BUN 36* 23* 42*  CREATININE 5.78* 4.26* 5.23*  CALCIUM 8.7* 8.6* 8.1*  PHOS 5.7* 5.5*  --     Liver Function Tests: Recent Labs  Lab 10/07/20 1248 10/10/20 0122 10/11/20 0140  AST 13*  --   --   ALT <5  --   --   ALKPHOS 427*  --   --   BILITOT 1.1  --   --   PROT 6.4*  --   --   ALBUMIN 1.8* 2.6* 2.3*    CBC: Recent Labs  Lab 10/07/20 1248 10/08/20 0000 10/10/20 0122 10/11/20 0140 10/12/20 0330  WBC 10.1 9.1 10.8* 14.0* 13.8*  NEUTROABS 8.8*  --   --   --   --   HGB 9.0* 8.2* 8.8* 11.6* 10.8*  HCT 31.6* 28.9* 29.9* 38.2* 36.1*  MCV 100.0 101.8* 98.0 95.5 96.0  PLT 357 354 388 385 433*    Iron Studies:  No results for input(s): IRON, TIBC, TRANSFERRIN, FERRITIN in the last 72 hours.  Medications:  tranexamic acid      (feeding supplement) PROSource Plus  30 mL Oral BID BM   vitamin C  1,000 mg Oral Daily   aspirin  325 mg Oral Daily   atorvastatin  10 mg Oral QPM   calcium acetate  1,334 mg Oral TID WC   Chlorhexidine Gluconate Cloth  6 each Topical Q0600   cinacalcet  30 mg Oral Q breakfast   collagenase   Topical Daily    cycloSPORINE  1 drop Both Eyes Daily   [START ON 10/16/2020] darbepoetin (ARANESP) injection - DIALYSIS  150 mcg Intravenous Q Thu-HD   docusate sodium  100 mg Oral Daily   gabapentin  300 mg Oral QHS   heparin  5,000 Units Subcutaneous Q8H   insulin aspart  0-6 Units Subcutaneous Q4H   insulin glargine  5 Units Subcutaneous Daily   midodrine  5 mg Oral Q T,Th,Sat-1800   nutrition supplement (JUVEN)  1 packet Oral BID BM   pantoprazole  40 mg Oral Daily   polyethylene glycol  17 g Oral Daily   sucroferric oxyhydroxide  500 mg Oral Q breakfast   zinc sulfate  220 mg Oral Daily    Dialysis Orders: TTS at [facility] --> need to clarify 3:45hr, 250dialyzer, 450/A1.5, EDW ~ 90kg, 2K/2.5Ca, AVF, heparin 6000 + 500/hr - Aranesp 136mg IV q Thur - Zemplar 971m IV q HD   Assessment/Plan: B heel wounds/osteomyelitis: Prev recommendation was B BKA and had  refused. After many discussions, now agreeable. SP B BKA with Dr. Sharol Given on 7/22. Volume: moderate dependent edema. Also bilat BKA should lower edw by about 5-7kg.   ESRD: Usual TTS sched, had HD here on Wed and Friday last week. Plan next HD tomorrow off schedule, then short HD Tuesday to get back on schedule.   Hypertension: No antihypertensive meds - on mido '5mg'$  pre-HD only which can be escalated up if we run into issues with BP dropping on HD.  Anemia: Hgb 8.8 - will continue weekly Aranesp. SP prbc's yesterday  Metabolic bone disease: Ca ok, Phos ok. Continue binders (Phoslo), sensipar.  Nutrition: Alb very low - continue supplements.  Sacral wound: Noted during last admit. Per primary.  Hx finger amputations, ischemic  HFrEF (25-30%): Perhaps will improve once volume corrected  Myoclonic jerking: Noted on exam 7/21 - felt likely d/t high dose gabapentin ('300mg'$  TID). Dose reduced but now pt is lethargic so will dc this altogether.   Kelly Splinter, MD 10/12/2020, 5:57 PM

## 2020-10-12 NOTE — Progress Notes (Signed)
PROGRESS NOTE    [name] [name]  [id number] DOB: November 07, 1965 DOA: 10/07/2020 PCP: [name], MD    Brief Narrative:  55 y/o male with history of IDDM, ESRD who was recently admitted with bilateral heel osteomyelitis and necrotic ulcers. He refused b/l BKA at that time. He was readmitted with worsening bilateral foot pain. He was seen by orthopedics and podiatry who are recommending bilateral bka.    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)   Chronic osteomyelitis of foot ([name])   Cutaneous abscess of foot   Bilateral heel osteomyelitis/abscess ulceration and necrosis -was recommended to have b/l BKA during last admission but refused -patient had been receiving IV cefepime as outpatient at his dialysis center  -it appears he was also receiving doxycycline and flagyl -readmitted for worsening pain  -restarted on IV antibiotics, vancomycin and zosyn -seen by podiatry and orthopedics, both recommending b/l BKA -s/p B/L BKA on 7/22 -He has wound vac on both stumps -further post op care per ortho  ESRD -TTS -seen by nephrology -last HD session on 7/22  Lethargy -I suspect this is related to medications -patient has been receiving oxycodone and dilaudid for pain -on every visit, he has appeared lethargic and often closes eyes during conversation -when asked his pain level by staff, he routinely reports 8-9/10 -currently, he reports his pain level is 8/10, but also says that he feels it is manageable -patient was educated on interpretation of pain scale and that if pain is manageable, it is likely not an 8-9/10 -after discussing with patient and family, we will discontinue dilaudid and continue oxycodone prn. All parties were in agreement  Pressure injuries Pressure Injury 10/08/20 Vertebral column Lower Unstageable - Full thickness tissue loss in which the base of the injury is covered  by slough (yellow, tan, gray, green or brown) and/or eschar (tan, brown or black) in the wound bed. (Active)  10/08/20 0100  Location: Vertebral column  Location Orientation: Lower  Staging: Unstageable - Full thickness tissue loss in which the base of the injury is covered by slough (yellow, tan, gray, green or brown) and/or eschar (tan, brown or black) in the wound bed.  Wound Description (Comments):   Present on Admission: Yes     Pressure Injury 10/08/20 Coccyx Unstageable - Full thickness tissue loss in which the base of the injury is covered by slough (yellow, tan, gray, green or brown) and/or eschar (tan, brown or black) in the wound bed. (Active)  10/08/20 0100  Location: Coccyx  Location Orientation:   Staging: Unstageable - Full thickness tissue loss in which the base of the injury is covered by slough (yellow, tan, gray, green or brown) and/or eschar (tan, brown or black) in the wound bed.  Wound Description (Comments):   Present on Admission: Yes     Pressure Injury Buttocks (Active)     Location: Buttocks  Location Orientation:   Staging:   Wound Description (Comments):   Present on Admission:      Pressure Injury Buttocks Bilateral Unstageable - Full thickness tissue loss in which the base of the injury is covered by slough (yellow, tan, gray, green or brown) and/or eschar (tan, brown or black) in the wound bed. (Active)     Location: Buttocks  Location Orientation: Bilateral  Staging: Unstageable - Full thickness tissue loss in which the base of the injury is covered by slough (yellow, tan, gray, green  or brown) and/or eschar (tan, brown or black) in the wound bed.  Wound Description (Comments):   Present on Admission: Yes     Pressure Injury Thigh Posterior;Proximal;Right Unstageable - Full thickness tissue loss in which the base of the injury is covered by slough (yellow, tan, gray, green or brown) and/or eschar (tan, brown or black) in the wound bed. (Active)      Location: Thigh  Location Orientation: Posterior;Proximal;Right  Staging: Unstageable - Full thickness tissue loss in which the base of the injury is covered by slough (yellow, tan, gray, green or brown) and/or eschar (tan, brown or black) in the wound bed.  Wound Description (Comments):   Present on Admission: Yes  -appreciate WOC recommendations  Anasarca -patient is hypoalbuminemic  -nutrition to follow  IDDM -chronically on insulin pump -will hold for now and start on lantus/SSI -blood sugars currently stable  Anemia of chronic disease -likely related to renal disease -his chronic foot infections likely also contributing -transfused 1 unit prbc preoperatively in anticipation for surgery -currently hemoglobin stable.  Acute on chronic systolic CHF -EF [id number] -Continues have evidence of volume overload -volume management with dialysis -suspect that hypoalbuminemia also contributing to anasarca  Constipation -resolved with suppository -continue miralax  Dispo -seen by PT with recommendations for CIR -family wishes to reduce pain medications to ensure that he remains awake enough for therapy and once more awake, they will discuss the option of CIR with patient.   DVT prophylaxis: heparin injection 5,000 Units Start: 10/07/20 2200  Code Status: full code Family Communication: discussed with mother and brother at bedside Disposition Plan: Status is: Inpatient  Remains inpatient appropriate because:Ongoing active pain requiring inpatient pain management, Ongoing diagnostic testing needed not appropriate for outpatient work up, IV treatments appropriate due to intensity of illness or inability to take PO, and Inpatient level of care appropriate due to severity of illness  Dispo: The patient is from: Home              Anticipated d/c is to:  TBD              Patient currently is medically stable to d/c.   Difficult to place patient No         Consultants:   Orthopedics, Dr. Sharol Given Nephrology Podiatry, Dr. Jacqualyn Posey  Procedures:  7/22 bilateral BKA  Antimicrobials:  Vancomycin 7/20> Zosyn 7/20>    Subjective: Patient did have a bowel movement yesterday. Complains of continued pain. Family is concerned that he is getting excessive pain medications. Review of chart shows that patient routinely reports his pain as 8 out of 10. Family notes that he is routinely lethargic.  Objective: Vitals:   10/11/20 1822 10/11/20 2050 10/12/20 0525 10/12/20 0955  BP: 124/71 129/76 116/69 116/73  Pulse: (!) 102 (!) 102 100 94  Resp: '17 14 16 17  '$ Temp: 98.4 F (36.9 C) 97.9 F (36.6 C) (!) 97.4 F (36.3 C) 97.6 F (36.4 C)  TempSrc: Oral Oral Oral Oral  SpO2: 91% 90% 100% 99%  Weight:        Intake/Output Summary (Last 24 hours) at 10/12/2020 1456 Last data filed at 10/12/2020 0201 Gross per 24 hour  Intake 549.8 ml  Output --  Net 549.8 ml   Filed Weights   10/08/20 0730 10/08/20 1148 10/10/20 0611  Weight: 95.8 kg 91 kg 91 kg    Examination:  General exam: somnolent, no distress Respiratory system: Clear to auscultation. Respiratory effort normal. Cardiovascular system:RRR. No murmurs, rubs,  gallops. Gastrointestinal system: Abdomen is nondistended, soft and nontender. No organomegaly or masses felt. Normal bowel sounds heard. Central nervous system:  No focal neurological deficits. Extremities: b/l BKA with b/l wound vac Skin: No rashes, lesions or ulcers Psychiatry: somnolent, confused at times      Data Reviewed: I have personally reviewed following labs and imaging studies  CBC: Recent Labs  Lab 10/07/20 1248 10/08/20 0000 10/10/20 0122 10/11/20 0140 10/12/20 0330  WBC 10.1 9.1 10.8* 14.0* 13.8*  NEUTROABS 8.8*  --   --   --   --   HGB 9.0* 8.2* 8.8* 11.6* 10.8*  HCT 31.6* 28.9* 29.9* 38.2* 36.1*  MCV 100.0 101.8* 98.0 95.5 96.0  PLT 357 354 388 385 [id number]*   Basic Metabolic Panel: Recent Labs  Lab 10/08/20 0000  10/09/20 1322 10/10/20 0122 10/11/20 0140 10/12/20 0330  NA 134* 133* 134* 135 134*  K 4.5 4.1 4.1 4.1 4.0  CL 95* 95* 96* 96* 96*  CO2 21* '24 23 22 23  '$ GLUCOSE 272* 118* 83 113* 186*  BUN 44* 30* 36* 23* 42*  CREATININE 6.99* 5.31* 5.78* 4.26* 5.23*  CALCIUM 8.2* 8.7* 8.7* 8.6* 8.1*  PHOS  --   --  5.7* 5.5*  --    GFR: Estimated Creatinine Clearance: 16.7 mL/min (A) (by C-G formula based on SCr of 5.23 mg/dL (H)). Liver Function Tests: Recent Labs  Lab 10/07/20 1248 10/10/20 0122 10/11/20 0140  AST 13*  --   --   ALT <5  --   --   ALKPHOS 427*  --   --   BILITOT 1.1  --   --   PROT 6.4*  --   --   ALBUMIN 1.8* 2.6* 2.3*   No results for input(s): LIPASE, AMYLASE in the last 168 hours. Recent Labs  Lab 10/09/20 1607  AMMONIA 38*   Coagulation Profile: Recent Labs  Lab 10/07/20 1248  INR 1.3*   Cardiac Enzymes: No results for input(s): CKTOTAL, CKMB, CKMBINDEX, TROPONINI in the last 168 hours. BNP (last 3 results) No results for input(s): PROBNP in the last 8760 hours. HbA1C: No results for input(s): HGBA1C in the last 72 hours. CBG: Recent Labs  Lab 10/07/20 1208 10/08/20 0155 10/08/20 0641 10/10/20 1546 10/11/20 0406  GLUCAP 356* 250* 224* 97 117*   Lipid Profile: No results for input(s): CHOL, HDL, LDLCALC, TRIG, CHOLHDL, LDLDIRECT in the last 72 hours. Thyroid Function Tests: No results for input(s): TSH, T4TOTAL, FREET4, T3FREE, THYROIDAB in the last 72 hours. Anemia Panel: No results for input(s): VITAMINB12, FOLATE, FERRITIN, TIBC, IRON, RETICCTPCT in the last 72 hours.  Sepsis Labs: Recent Labs  Lab 10/07/20 1248 10/07/20 2033  LATICACIDVEN 2.2* 2.6*    Recent Results (from the past 240 hour(s))  Culture, blood (Routine x 2)     Status: None   Collection Time: 10/07/20  1:06 PM   Specimen: BLOOD  Result Value Ref Range Status   Specimen Description BLOOD LEFT ANTECUBITAL  Final   Special Requests   Final    BOTTLES DRAWN AEROBIC  AND ANAEROBIC Blood Culture results may not be optimal due to an inadequate volume of blood received in culture bottles   Culture   Final    NO GROWTH 5 DAYS Performed at [facility], [place] [address], [place], [place] [address]    Report Status 10/12/2020 FINAL  Final  SARS CORONAVIRUS 2 (TAT 6-24 HRS) Nasopharyngeal Nasopharyngeal Swab     Status: None  Collection Time: 10/07/20  4:39 PM   Specimen: Nasopharyngeal Swab  Result Value Ref Range Status   SARS Coronavirus 2 NEGATIVE NEGATIVE Final    Comment: (NOTE) SARS-CoV-2 target nucleic acids are NOT DETECTED.  The SARS-CoV-2 RNA is generally detectable in upper and lower respiratory specimens during the acute phase of infection. Negative results do not preclude SARS-CoV-2 infection, do not rule out co-infections with other pathogens, and should not be used as the sole basis for treatment or other patient management decisions. Negative results must be combined with clinical observations, patient history, and epidemiological information. The expected result is Negative.  Fact Sheet for Patients: SugarRoll.be  Fact Sheet for Healthcare Providers: https://www.woods-mathews.com/  This test is not yet approved or cleared by the [place] FDA and  has been authorized for detection and/or diagnosis of SARS-CoV-2 by FDA under an Emergency Use Authorization (EUA). This EUA will remain  in effect (meaning this test can be used) for the duration of the COVID-19 declaration under Se ction 564(b)(1) of the Act, 21 U.S.C. section 360bbb-3(b)(1), unless the authorization is terminated or revoked sooner.  Performed at [facility], [place] [address], [place], [place] [address]   Culture, blood (Routine x 2)     Status: None   Collection Time: 10/07/20  8:07 PM   Specimen: BLOOD LEFT HAND  Result Value Ref Range Status   Specimen Description BLOOD LEFT HAND  Final   Special Requests    Final    BOTTLES DRAWN AEROBIC AND ANAEROBIC Blood Culture adequate volume   Culture   Final    NO GROWTH 5 DAYS Performed at [facility], [place] [address], [place], [place] [address]    Report Status 10/12/2020 FINAL  Final  Culture, blood (single)     Status: None (Preliminary result)   Collection Time: 10/08/20  4:04 AM   Specimen: BLOOD  Result Value Ref Range Status   Specimen Description BLOOD SITE NOT SPECIFIED  Final   Special Requests   Final    BOTTLES DRAWN AEROBIC AND ANAEROBIC Blood Culture adequate volume   Culture   Final    NO GROWTH 4 DAYS Performed at [facility], [place] [address] [place], [place] [address]    Report Status PENDING  Incomplete  Surgical pcr screen     Status: None   Collection Time: 10/10/20  9:12 AM   Specimen: Nasal Mucosa; Nasal Swab  Result Value Ref Range Status   MRSA, PCR NEGATIVE NEGATIVE Final   Staphylococcus aureus NEGATIVE NEGATIVE Final    Comment: (NOTE) The Xpert SA Assay (FDA approved for NASAL specimens in patients 63 years of age and older), is one component of a comprehensive surveillance program. It is not intended to diagnose infection nor to guide or monitor treatment. Performed at [facility], [place] [address], [place], [place] [address]          Radiology Studies: No results found.      Scheduled Meds:  (feeding supplement) PROSource Plus  30 mL Oral BID BM   vitamin C  1,000 mg Oral Daily   aspirin  325 mg Oral Daily   atorvastatin  10 mg Oral QPM   calcium acetate  1,334 mg Oral TID WC   Chlorhexidine Gluconate Cloth  6 each Topical Q0600   cinacalcet  30 mg Oral Q breakfast   collagenase   Topical Daily   cycloSPORINE  1 drop Both Eyes Daily   [START  ON 10/16/2020] darbepoetin (ARANESP) injection - DIALYSIS  150 mcg Intravenous Q Thu-HD   docusate sodium  100 mg Oral Daily   gabapentin  300 mg Oral QHS   heparin  5,000 Units Subcutaneous Q8H   insulin aspart  0-6 Units  Subcutaneous Q4H   insulin glargine  5 Units Subcutaneous Daily   midodrine  5 mg Oral Q T,Th,Sat-1800   nutrition supplement (JUVEN)  1 packet Oral BID BM   pantoprazole  40 mg Oral Daily   polyethylene glycol  17 g Oral Daily   sucroferric oxyhydroxide  500 mg Oral Q breakfast   zinc sulfate  220 mg Oral Daily   Continuous Infusions:  tranexamic acid       LOS: 5 days    Time spent: 44mns    JKathie Dike MD [facility]   If 7PM-7AM, please contact night-coverage [email or web address]  10/12/2020, 2:56 PM

## 2020-10-13 DIAGNOSIS — M86679 Other chronic osteomyelitis, unspecified ankle and foot: Secondary | ICD-10-CM | POA: Diagnosis not present

## 2020-10-13 DIAGNOSIS — E11628 Type 2 diabetes mellitus with other skin complications: Secondary | ICD-10-CM | POA: Diagnosis not present

## 2020-10-13 DIAGNOSIS — M8600 Acute hematogenous osteomyelitis, unspecified site: Secondary | ICD-10-CM | POA: Diagnosis not present

## 2020-10-13 DIAGNOSIS — S90811A Abrasion, right foot, initial encounter: Secondary | ICD-10-CM | POA: Diagnosis not present

## 2020-10-13 LAB — RENAL FUNCTION PANEL
Albumin: 2.1 g/dL — ABNORMAL LOW (ref 3.5–5.0)
Anion gap: 15 (ref 5–15)
BUN: 60 mg/dL — ABNORMAL HIGH (ref 6–20)
CO2: 23 mmol/L (ref 22–32)
Calcium: 7.7 mg/dL — ABNORMAL LOW (ref 8.9–10.3)
Chloride: 91 mmol/L — ABNORMAL LOW (ref 98–111)
Creatinine, Ser: 5.96 mg/dL — ABNORMAL HIGH (ref 0.61–1.24)
GFR, Estimated: 11 mL/min — ABNORMAL LOW (ref >60)
Glucose, Bld: 123 mg/dL — ABNORMAL HIGH (ref 70–99)
Phosphorus: 5.6 mg/dL — ABNORMAL HIGH (ref 2.5–4.6)
Potassium: 4.2 mmol/L (ref 3.5–5.1)
Sodium: 129 mmol/L — ABNORMAL LOW (ref 135–145)

## 2020-10-13 LAB — CBC
HCT: 34 % — ABNORMAL LOW (ref 39.0–52.0)
Hemoglobin: 10 g/dL — ABNORMAL LOW (ref 13.0–17.0)
MCH: 28.2 pg (ref 26.0–34.0)
MCHC: 29.4 g/dL — ABNORMAL LOW (ref 30.0–36.0)
MCV: 95.8 fL (ref 80.0–100.0)
Platelets: 437 10*3/uL — ABNORMAL HIGH (ref 150–400)
RBC: 3.55 MIL/uL — ABNORMAL LOW (ref 4.22–5.81)
RDW: 16.6 % — ABNORMAL HIGH (ref 11.5–15.5)
WBC: 10.4 10*3/uL (ref 4.0–10.5)
nRBC: 0.3 % — ABNORMAL HIGH (ref 0.0–0.2)

## 2020-10-13 LAB — CULTURE, BLOOD (SINGLE)
Culture: NO GROWTH
Special Requests: ADEQUATE

## 2020-10-13 LAB — SURGICAL PATHOLOGY

## 2020-10-13 MED ORDER — POLYETHYLENE GLYCOL 3350 17 G PO PACK: 17.0000 g | PACK | Freq: Every day | ORAL | Status: DC | PRN

## 2020-10-13 MED ORDER — MENTHOL 3 MG MT LOZG
1.0000 | LOZENGE | OROMUCOSAL | Status: DC | PRN
  Administered 2020-10-13 – 2020-10-14 (×2): 3 mg via ORAL
  Filled 2020-10-13: qty 9

## 2020-10-13 NOTE — Progress Notes (Signed)
[facility] Progress Note   Subjective:  Seen in room, pain a little better. No SOB or cough.   Objective Vitals:   10/12/20 1721 10/12/20 2104 10/13/20 0536 10/13/20 0934  BP: 100/86 105/85 122/84 113/79  Pulse: 97 95 92 91  Resp: '18 18 18 18  '$ Temp: 98 F (36.7 C) 98.2 F (36.8 C) 98.1 F (36.7 C) (!) 97.5 F (36.4 C)  TempSrc: Oral Oral Oral Oral  SpO2: 98% 98% 97% 96%  Weight:       Physical Exam General: Chronically ill appearing man, NAD. Room air.  Heart: RRR; no murmur Lungs: CTA anteriorly Abdomen: soft, non-tender Extremities: 1-2 + bilat hip edema Dialysis Access: R AVF + bruit  Additional Objective Labs: Basic Metabolic Panel: Recent Labs  Lab 10/10/20 0122 10/11/20 0140 10/12/20 0330 10/13/20 0430  NA 134* 135 134* 129*  K 4.1 4.1 4.0 4.2  CL 96* 96* 96* 91*  CO2 '23 22 23 23  '$ GLUCOSE 83 113* 186* 123*  BUN 36* 23* 42* 60*  CREATININE 5.78* 4.26* 5.23* 5.96*  CALCIUM 8.7* 8.6* 8.1* 7.7*  PHOS 5.7* 5.5*  --  5.6*    Liver Function Tests: Recent Labs  Lab 10/07/20 1248 10/10/20 0122 10/11/20 0140 10/13/20 0430  AST 13*  --   --   --   ALT <5  --   --   --   ALKPHOS 427*  --   --   --   BILITOT 1.1  --   --   --   PROT 6.4*  --   --   --   ALBUMIN 1.8* 2.6* 2.3* 2.1*    CBC: Recent Labs  Lab 10/07/20 1248 10/08/20 0000 10/10/20 0122 10/11/20 0140 10/12/20 0330 10/13/20 0430  WBC 10.1 9.1 10.8* 14.0* 13.8* 10.4  NEUTROABS 8.8*  --   --   --   --   --   HGB 9.0* 8.2* 8.8* 11.6* 10.8* 10.0*  HCT 31.6* 28.9* 29.9* 38.2* 36.1* 34.0*  MCV 100.0 101.8* 98.0 95.5 96.0 95.8  PLT 357 354 388 385 433* 437*    Iron Studies:  No results for input(s): IRON, TIBC, TRANSFERRIN, FERRITIN in the last 72 hours.  Medications:  tranexamic acid      (feeding supplement) PROSource Plus  30 mL Oral BID BM   vitamin C  1,000 mg Oral Daily   aspirin  325 mg Oral Daily   atorvastatin  10 mg Oral QPM   calcium acetate  1,334 mg  Oral TID WC   Chlorhexidine Gluconate Cloth  6 each Topical Q0600   cinacalcet  30 mg Oral Q breakfast   collagenase   Topical Daily   cycloSPORINE  1 drop Both Eyes Daily   [START ON 10/16/2020] darbepoetin (ARANESP) injection - DIALYSIS  150 mcg Intravenous Q Thu-HD   heparin  5,000 Units Subcutaneous Q8H   insulin aspart  0-6 Units Subcutaneous Q4H   insulin glargine  5 Units Subcutaneous Daily   midodrine  5 mg Oral Q T,Th,Sat-1800   nutrition supplement (JUVEN)  1 packet Oral BID BM   pantoprazole  40 mg Oral Daily   sucroferric oxyhydroxide  500 mg Oral Q breakfast   zinc sulfate  220 mg Oral Daily    Dialysis Orders: TTS at [facility] --> need to clarify  3h 53mn  450/1.5  90kg  2/2.5 bath AVF  Hep 6000 +500u/hr - Aranesp 105m IV q Thur - Zemplar 73m62mIV  q HD   Assessment/Plan: B heel wounds/osteomyelitis: Prev recommendation was B BKA and had refused initially then agreed and is SP B BKA Dr. Sharol Given on 7/22. Volume: moderate hip edema. Also bilat BKA should lower edw by about 5-7kg, therefore is still up about 5-6kg.   ESRD: Usual TTS sched, had HD here on Wed and Friday last week. Plan next HD today, then short HD Tuesday to get back on schedule.   Hypertension: No antihypertensive meds - on mido '5mg'$  pre  Anemia: Hgb 8.8 - will continue weekly Aranesp. SP prbc's yesterday  Metabolic bone disease: Ca ok, Phos ok. Continue binders (Phoslo), sensipar.  Nutrition: Alb very low - continue supplements.  Sacral wound: Noted during last admit. Per primary.  Hx finger amputations, ischemic  HFrEF (25-30%): Perhaps will improve once volume corrected  Myoclonic jerking: lowered dose then dc'd altogether for now. Jerking resolved.   Kelly Splinter, MD 10/13/2020, 1:23 PM

## 2020-10-13 NOTE — Plan of Care (Signed)

## 2020-10-13 NOTE — Progress Notes (Signed)
PROGRESS NOTE    [name] [name]  [id number] DOB: 1966-03-16 DOA: 10/07/2020 PCP: [name], MD    Brief Narrative:  55 y/o male with history of IDDM, ESRD who was recently admitted with bilateral heel osteomyelitis and necrotic ulcers. He refused b/l BKA at that time. He was readmitted with worsening bilateral foot pain. He was seen by orthopedics and podiatry who are recommending bilateral bka.    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)   Chronic osteomyelitis of foot ([name])   Cutaneous abscess of foot   Bilateral heel osteomyelitis/abscess ulceration and necrosis -was recommended to have b/l BKA during last admission but refused -patient had been receiving IV cefepime as outpatient at his dialysis center  -it appears he was also receiving doxycycline and flagyl -readmitted for worsening pain  -restarted on IV antibiotics, vancomycin and zosyn -seen by podiatry and orthopedics, both recommending b/l BKA -s/p B/L BKA on 7/22 -He has wound vac on both stumps -further post op care per ortho -We will need to discuss further antibiotics with orthopedics  ESRD -TTS -seen by nephrology -last HD session on 7/22  Lethargy -I suspect this is related to medications -patient has been receiving oxycodone and dilaudid for pain -on every visit, he has appeared lethargic and often closes eyes during conversation -when asked his pain level by staff, he routinely reports 8-9/10 -currently, he reports his pain level is 8/10, but also says that he feels it is manageable -patient was educated on interpretation of pain scale and that if pain is manageable, it is likely not an 8-9/10 -after discussing with patient and family, we will discontinue dilaudid and continue oxycodone prn. All parties were in agreement -Overall mental status appears to be improving now that pain medications have been  de-escalated  Pressure injuries Pressure Injury 10/08/20 Vertebral column Lower Unstageable - Full thickness tissue loss in which the base of the injury is covered by slough (yellow, tan, gray, green or brown) and/or eschar (tan, brown or black) in the wound bed. (Active)  10/08/20 0100  Location: Vertebral column  Location Orientation: Lower  Staging: Unstageable - Full thickness tissue loss in which the base of the injury is covered by slough (yellow, tan, gray, green or brown) and/or eschar (tan, brown or black) in the wound bed.  Wound Description (Comments):   Present on Admission: Yes     Pressure Injury 10/08/20 Coccyx Unstageable - Full thickness tissue loss in which the base of the injury is covered by slough (yellow, tan, gray, green or brown) and/or eschar (tan, brown or black) in the wound bed. (Active)  10/08/20 0100  Location: Coccyx  Location Orientation:   Staging: Unstageable - Full thickness tissue loss in which the base of the injury is covered by slough (yellow, tan, gray, green or brown) and/or eschar (tan, brown or black) in the wound bed.  Wound Description (Comments):   Present on Admission: Yes     Pressure Injury Buttocks (Active)     Location: Buttocks  Location Orientation:   Staging:   Wound Description (Comments):   Present on Admission:      Pressure Injury Buttocks Bilateral Unstageable - Full thickness tissue loss in which the base of the injury is covered by slough (yellow, tan, gray, green or brown) and/or eschar (tan, brown or black) in the wound bed. (Active)     Location: Buttocks  Location Orientation: Bilateral  Staging: Unstageable - Full thickness tissue loss in which the base of the injury is covered by slough (yellow, tan, gray, green or brown) and/or eschar (tan, brown or black) in the wound bed.  Wound Description (Comments):   Present on Admission: Yes     Pressure Injury Thigh Posterior;Proximal;Right Unstageable - Full thickness  tissue loss in which the base of the injury is covered by slough (yellow, tan, gray, green or brown) and/or eschar (tan, brown or black) in the wound bed. (Active)     Location: Thigh  Location Orientation: Posterior;Proximal;Right  Staging: Unstageable - Full thickness tissue loss in which the base of the injury is covered by slough (yellow, tan, gray, green or brown) and/or eschar (tan, brown or black) in the wound bed.  Wound Description (Comments):   Present on Admission: Yes  -appreciate WOC recommendations  Anasarca -patient is hypoalbuminemic  -nutrition to follow  IDDM -chronically on insulin pump -will hold for now and start on lantus/SSI -blood sugars currently stable  Anemia of chronic disease -likely related to renal disease -his chronic foot infections likely also contributing -transfused 1 unit prbc preoperatively in anticipation for surgery -currently hemoglobin stable.  Acute on chronic systolic CHF -EF [id number] -Continues have evidence of volume overload -volume management with dialysis -suspect that hypoalbuminemia also contributing to anasarca  Constipation -resolved with suppository -continue miralax as needed  Dispo -seen by PT with recommendations for CIR -Patient and his mom report that he would rather go to skilled nursing facility   DVT prophylaxis: heparin injection 5,000 Units Start: 10/07/20 2200  Code Status: full code Family Communication: discussed with mother and brother at bedside Disposition Plan: Status is: Inpatient  Remains inpatient appropriate because:Ongoing active pain requiring inpatient pain management, Ongoing diagnostic testing needed not appropriate for outpatient work up, IV treatments appropriate due to intensity of illness or inability to take PO, and Inpatient level of care appropriate due to severity of illness  Dispo: The patient is from: Home              Anticipated d/c is to:  TBD              Patient currently is  medically stable to d/c.   Difficult to place patient No         Consultants:  Orthopedics, Dr. Sharol Given Nephrology Podiatry, Dr. Jacqualyn Posey  Procedures:  7/22 bilateral BKA  Antimicrobials:  Vancomycin 7/20> Zosyn 7/20>    Subjective: Patient is more awake today, although at times still somnolent.  Reports that pain is manageable.  Objective: Vitals:   10/12/20 2104 10/13/20 0536 10/13/20 0934 10/13/20 1652  BP: 105/85 122/84 113/79 124/80  Pulse: 95 92 91 89  Resp: '18 18 18 18  '$ Temp: 98.2 F (36.8 C) 98.1 F (36.7 C) (!) 97.5 F (36.4 C) 97.7 F (36.5 C)  TempSrc: Oral Oral Oral Axillary  SpO2: 98% 97% 96% 100%  Weight:        Intake/Output Summary (Last 24 hours) at 10/13/2020 1945 Last data filed at 10/13/2020 1300 Gross per 24 hour  Intake 120 ml  Output --  Net 120 ml   Filed Weights   10/08/20 0730 10/08/20 1148 10/10/20 0611  Weight: 95.8 kg 91 kg 91 kg    Examination:  General exam: Alert, awake, oriented x 3 Respiratory system: Clear to auscultation. Respiratory effort normal. Cardiovascular system:RRR. No murmurs, rubs, gallops. Gastrointestinal system: Abdomen is nondistended, soft and nontender. No organomegaly or masses felt. Normal bowel sounds  heard. Central nervous system: Alert and oriented. No focal neurological deficits. Extremities: Bilateral BKA Skin: No rashes, lesions or ulcers Psychiatry: Judgement and insight appear normal. Mood & affect appropriate.     Data Reviewed: I have personally reviewed following labs and imaging studies  CBC: Recent Labs  Lab 10/07/20 1248 10/08/20 0000 10/10/20 0122 10/11/20 0140 10/12/20 0330 10/13/20 0430  WBC 10.1 9.1 10.8* 14.0* 13.8* 10.4  NEUTROABS 8.8*  --   --   --   --   --   HGB 9.0* 8.2* 8.8* 11.6* 10.8* 10.0*  HCT 31.6* 28.9* 29.9* 38.2* 36.1* 34.0*  MCV 100.0 101.8* 98.0 95.5 96.0 95.8  PLT 357 354 388 385 433* [id number]*   Basic Metabolic Panel: Recent Labs  Lab 10/09/20 1322  10/10/20 0122 10/11/20 0140 10/12/20 0330 10/13/20 0430  NA 133* 134* 135 134* 129*  K 4.1 4.1 4.1 4.0 4.2  CL 95* 96* 96* 96* 91*  CO2 '24 23 22 23 23  '$ GLUCOSE 118* 83 113* 186* 123*  BUN 30* 36* 23* 42* 60*  CREATININE 5.31* 5.78* 4.26* 5.23* 5.96*  CALCIUM 8.7* 8.7* 8.6* 8.1* 7.7*  PHOS  --  5.7* 5.5*  --  5.6*   GFR: Estimated Creatinine Clearance: 14.7 mL/min (A) (by C-G formula based on SCr of 5.96 mg/dL (H)). Liver Function Tests: Recent Labs  Lab 10/07/20 1248 10/10/20 0122 10/11/20 0140 10/13/20 0430  AST 13*  --   --   --   ALT <5  --   --   --   ALKPHOS 427*  --   --   --   BILITOT 1.1  --   --   --   PROT 6.4*  --   --   --   ALBUMIN 1.8* 2.6* 2.3* 2.1*   No results for input(s): LIPASE, AMYLASE in the last 168 hours. Recent Labs  Lab 10/09/20 1607  AMMONIA 38*   Coagulation Profile: Recent Labs  Lab 10/07/20 1248  INR 1.3*   Cardiac Enzymes: No results for input(s): CKTOTAL, CKMB, CKMBINDEX, TROPONINI in the last 168 hours. BNP (last 3 results) No results for input(s): PROBNP in the last 8760 hours. HbA1C: No results for input(s): HGBA1C in the last 72 hours. CBG: Recent Labs  Lab 10/08/20 0155 10/08/20 0641 10/10/20 1546 10/11/20 0406 10/12/20 2122  GLUCAP 250* 224* 97 117* 126*   Lipid Profile: No results for input(s): CHOL, HDL, LDLCALC, TRIG, CHOLHDL, LDLDIRECT in the last 72 hours. Thyroid Function Tests: No results for input(s): TSH, T4TOTAL, FREET4, T3FREE, THYROIDAB in the last 72 hours. Anemia Panel: No results for input(s): VITAMINB12, FOLATE, FERRITIN, TIBC, IRON, RETICCTPCT in the last 72 hours.  Sepsis Labs: Recent Labs  Lab 10/07/20 1248 10/07/20 2033  LATICACIDVEN 2.2* 2.6*    Recent Results (from the past 240 hour(s))  Culture, blood (Routine x 2)     Status: None   Collection Time: 10/07/20  1:06 PM   Specimen: BLOOD  Result Value Ref Range Status   Specimen Description BLOOD LEFT ANTECUBITAL  Final   Special  Requests   Final    BOTTLES DRAWN AEROBIC AND ANAEROBIC Blood Culture results may not be optimal due to an inadequate volume of blood received in culture bottles   Culture   Final    NO GROWTH 5 DAYS Performed at [facility], [place] [address], [place], [place] [address]    Report Status 10/12/2020 FINAL  Final  SARS CORONAVIRUS 2 (TAT 6-24 HRS)  Nasopharyngeal Nasopharyngeal Swab     Status: None   Collection Time: 10/07/20  4:39 PM   Specimen: Nasopharyngeal Swab  Result Value Ref Range Status   SARS Coronavirus 2 NEGATIVE NEGATIVE Final    Comment: (NOTE) SARS-CoV-2 target nucleic acids are NOT DETECTED.  The SARS-CoV-2 RNA is generally detectable in upper and lower respiratory specimens during the acute phase of infection. Negative results do not preclude SARS-CoV-2 infection, do not rule out co-infections with other pathogens, and should not be used as the sole basis for treatment or other patient management decisions. Negative results must be combined with clinical observations, patient history, and epidemiological information. The expected result is Negative.  Fact Sheet for Patients: SugarRoll.be  Fact Sheet for Healthcare Providers: https://www.woods-mathews.com/  This test is not yet approved or cleared by the [place] FDA and  has been authorized for detection and/or diagnosis of SARS-CoV-2 by FDA under an Emergency Use Authorization (EUA). This EUA will remain  in effect (meaning this test can be used) for the duration of the COVID-19 declaration under Se ction 564(b)(1) of the Act, 21 U.S.C. section 360bbb-3(b)(1), unless the authorization is terminated or revoked sooner.  Performed at [facility], [place] [address] [place], [place] [address]   Culture, blood (Routine x 2)     Status: None   Collection Time: 10/07/20  8:07 PM   Specimen: BLOOD LEFT HAND  Result Value Ref Range Status   Specimen Description  BLOOD LEFT HAND  Final   Special Requests   Final    BOTTLES DRAWN AEROBIC AND ANAEROBIC Blood Culture adequate volume   Culture   Final    NO GROWTH 5 DAYS Performed at [facility], [place] [address], [place], [place] [address]    Report Status 10/12/2020 FINAL  Final  Culture, blood (single)     Status: None   Collection Time: 10/08/20  4:04 AM   Specimen: BLOOD  Result Value Ref Range Status   Specimen Description BLOOD SITE NOT SPECIFIED  Final   Special Requests   Final    BOTTLES DRAWN AEROBIC AND ANAEROBIC Blood Culture adequate volume   Culture   Final    NO GROWTH 5 DAYS Performed at [facility], [place] [address], [place], [place] [address]    Report Status 10/13/2020 FINAL  Final  Surgical pcr screen     Status: None   Collection Time: 10/10/20  9:12 AM   Specimen: Nasal Mucosa; Nasal Swab  Result Value Ref Range Status   MRSA, PCR NEGATIVE NEGATIVE Final   Staphylococcus aureus NEGATIVE NEGATIVE Final    Comment: (NOTE) The Xpert SA Assay (FDA approved for NASAL specimens in patients 89 years of age and older), is one component of a comprehensive surveillance program. It is not intended to diagnose infection nor to guide or monitor treatment. Performed at [facility], [place] [address], [place], [place] [address]          Radiology Studies: No results found.      Scheduled Meds:  (feeding supplement) PROSource Plus  30 mL Oral BID BM   vitamin C  1,000 mg Oral Daily   aspirin  325 mg Oral Daily   atorvastatin  10 mg Oral QPM   calcium acetate  1,334 mg Oral TID WC   Chlorhexidine Gluconate Cloth  6 each Topical Q0600   cinacalcet  30 mg Oral Q breakfast   collagenase   Topical Daily  cycloSPORINE  1 drop Both Eyes Daily   [START ON 10/16/2020] darbepoetin (ARANESP) injection - DIALYSIS  150 mcg Intravenous Q Thu-HD   heparin  5,000 Units Subcutaneous Q8H   insulin aspart  0-6 Units Subcutaneous Q4H   insulin glargine  5 Units  Subcutaneous Daily   midodrine  5 mg Oral Q T,Th,Sat-1800   nutrition supplement (JUVEN)  1 packet Oral BID BM   pantoprazole  40 mg Oral Daily   sucroferric oxyhydroxide  500 mg Oral Q breakfast   zinc sulfate  220 mg Oral Daily   Continuous Infusions:  tranexamic acid       LOS: 6 days    Time spent: 59mns    JKathie Dike MD [facility]   If 7PM-7AM, please contact night-coverage [email or web address]  10/13/2020, 7:45 PM

## 2020-10-13 NOTE — Progress Notes (Signed)
Inpatient Rehab Admissions Coordinator:  Saw pt at bedside. Mother also present. Pt and mother informed AC that pt would like to go to a SNF for rehab.  TOC made aware.  Will sign off.   Gayland Curry, [place], [place] Admissions Coordinator [phone]

## 2020-10-14 DIAGNOSIS — M86679 Other chronic osteomyelitis, unspecified ankle and foot: Secondary | ICD-10-CM | POA: Diagnosis not present

## 2020-10-14 LAB — RENAL FUNCTION PANEL
Albumin: 2.1 g/dL — ABNORMAL LOW (ref 3.5–5.0)
Anion gap: 14 (ref 5–15)
BUN: 34 mg/dL — ABNORMAL HIGH (ref 6–20)
CO2: 27 mmol/L (ref 22–32)
Calcium: 7.7 mg/dL — ABNORMAL LOW (ref 8.9–10.3)
Chloride: 93 mmol/L — ABNORMAL LOW (ref 98–111)
Creatinine, Ser: 4.45 mg/dL — ABNORMAL HIGH (ref 0.61–1.24)
GFR, Estimated: 15 mL/min — ABNORMAL LOW (ref >60)
Glucose, Bld: 99 mg/dL (ref 70–99)
Phosphorus: 4.2 mg/dL (ref 2.5–4.6)
Potassium: 4.2 mmol/L (ref 3.5–5.1)
Sodium: 134 mmol/L — ABNORMAL LOW (ref 135–145)

## 2020-10-14 LAB — CBC
HCT: 36.8 % — ABNORMAL LOW (ref 39.0–52.0)
Hemoglobin: 11.1 g/dL — ABNORMAL LOW (ref 13.0–17.0)
MCH: 28.7 pg (ref 26.0–34.0)
MCHC: 30.2 g/dL (ref 30.0–36.0)
MCV: 95.1 fL (ref 80.0–100.0)
Platelets: 422 10*3/uL — ABNORMAL HIGH (ref 150–400)
RBC: 3.87 MIL/uL — ABNORMAL LOW (ref 4.22–5.81)
RDW: 16.7 % — ABNORMAL HIGH (ref 11.5–15.5)
WBC: 9 10*3/uL (ref 4.0–10.5)
nRBC: 0.2 % (ref 0.0–0.2)

## 2020-10-14 MED ORDER — LIDOCAINE 5 % EX PTCH
1.0000 | MEDICATED_PATCH | CUTANEOUS | Status: DC
  Administered 2020-10-14 – 2020-10-29 (×14): 1 via TRANSDERMAL
  Filled 2020-10-14 (×16): qty 1

## 2020-10-14 NOTE — NC FL2 (Signed)
[place] LEVEL OF CARE SCREENING TOOL     IDENTIFICATION  Patient Name: [name] [name] Birthdate: [birth year] Sex: male Admission Date (Current Location): 10/07/2020  [facility] and [place] Number:  Herbalist and Address:         Provider Number:    Attending Physician Name and Address:  Shelly Coss, MD  Relative Name and Phone Number:       Current Level of Care: Hospital Recommended Level of Care: [place] Prior Approval Number:    Date Approved/Denied:   PASRR Number: [id number]  Discharge Plan:      Current Diagnoses: Patient Active Problem List   Diagnosis Date Noted   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 ([place])    Chronic osteomyelitis of foot ([place])    Cutaneous abscess of foot    Foot abrasion, infected, right, initial encounter    Osteomyelitis of foot, left, acute ([place]) 09/26/2020   Osteomyelitis of foot, right, acute ([place]) 09/26/2020   Stage III pressure ulcer of sacral region ([place]) [id number]   Acute systolic CHF (congestive heart failure) ([place]) 09/26/2020   Acute on chronic systolic congestive heart failure ([name])    Pressure injury of skin 09/24/2020   Volume overload 09/23/2020   Osteomyelitis ([place]) 05/12/2020   Diabetic foot ulcer ([place]) 05/12/2020   Hereditary and idiopathic neuropathy, unspecified 02/25/2020   Presence of insulin pump (external) (internal) 02/25/2020   Raynaud's syndrome with gangrene ([place]) 02/25/2020   HLD (hyperlipidemia) 02/08/2020   Secondary hyperparathyroidism ([place]) 02/08/2020   Secondary anemia 02/08/2020   Retinal detachment 02/08/2020   Medication monitoring encounter 07/25/2019   Penile mass 07/16/2019   BMI 40.0-44.9, adult ([place]) 12/20/2018   Wound infection 09/15/2018   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)    Acute GI bleeding 10/13/2017   ESRD on hemodialysis ([place]) 10/13/2017   Embolism, arterial ([name]) 10/12/2017   Ischemia of  finger 10/18/2017   Deceased-donor kidney transplant 06/04/2016   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   Diabetes mellitus type 2, uncontrolled ([place]) 05/03/2014   Non-ischemic cardiomyopathy ([place]) 05/03/2014   Type 2 diabetes mellitus with hyperglycemia ([place]) 05/03/2014   Type II diabetes mellitus with renal manifestations ([name]) 02/01/2011   Mechanical complication of other vascular device, implant, and graft 02/01/2011   HYPERCHOLESTEROLEMIA 08/19/2009   Essential hypertension [id number]   Chronic systolic heart failure ([place]) 08/19/2009   CKD (chronic kidney disease) stage V requiring chronic dialysis ([place]) 08/19/2009   Depression 08/19/2009    Orientation RESPIRATION BLADDER Height & Weight     Self, Time, Situation, Place  Normal Continent Weight: 200 lb 9.9 oz (91 kg) Height:     BEHAVIORAL SYMPTOMS/MOOD NEUROLOGICAL BOWEL NUTRITION STATUS      Continent Diet (See DC summary)  AMBULATORY STATUS COMMUNICATION OF NEEDS Skin   Extensive Assist   Surgical wounds                       Personal Care Assistance Level of Assistance  Bathing, Feeding, Dressing Bathing Assistance: Maximum assistance Feeding assistance: Limited assistance Dressing Assistance: Maximum assistance     Functional Limitations Info  Hearing, Sight, Speech Sight Info: Adequate Hearing Info: Adequate Speech Info: Adequate    SPECIAL CARE FACTORS FREQUENCY  OT (By licensed OT), PT (By licensed PT)     PT Frequency: 5x a week OT Frequency: 5x a week  Contractures Contractures Info: Not present    Additional Factors Info  Code Status, Allergies, Insulin Sliding Scale Code Status Info: Full Allergies Info: NKA   Insulin Sliding Scale Info: NovoLOG 0-6 units every 4 hours       Current Medications (10/14/2020):  This is the current hospital active medication list Current Facility-Administered Medications  Medication Dose Route Frequency Provider  Last Rate Last Admin   (feeding supplement) PROSource Plus liquid 30 mL  30 mL Oral BID BM Persons, Bevely Palmer, PA   30 mL at 10/12/20 1315   acetaminophen (TYLENOL) tablet 650 mg  650 mg Oral Q6H PRN Persons, Bevely Palmer, PA   650 mg at 10/14/20 1055   Or   acetaminophen (TYLENOL) suppository 650 mg  650 mg Rectal Q6H PRN Persons, Bevely Palmer, PA       alum & mag hydroxide-simeth (MAALOX/MYLANTA) 200-200-20 MG/5ML suspension 15-30 mL  15-30 mL Oral Q2H PRN Persons, Bevely Palmer, [place]       ascorbic acid (VITAMIN C) tablet 1,000 mg  1,000 mg Oral Daily Persons, Bevely Palmer, PA   1,000 mg at 10/14/20 1021   aspirin tablet 325 mg  325 mg Oral Daily Persons, Bevely Palmer, PA   325 mg at 10/14/20 1021   atorvastatin (LIPITOR) tablet 10 mg  10 mg Oral QPM Persons, Bevely Palmer, PA   10 mg at 10/13/20 1838   calcium acetate (PHOSLO) capsule 1,334 mg  1,334 mg Oral TID WC Persons, Bevely Palmer, PA   1,334 mg at 10/12/20 1639   Chlorhexidine Gluconate Cloth 2 % PADS 6 each  6 each Topical Q0600 Persons, Bevely Palmer, [place]   6 each at 10/14/20 0602   cinacalcet (SENSIPAR) tablet 30 mg  30 mg Oral Q breakfast Persons, Bevely Palmer, PA   30 mg at 10/14/20 1021   collagenase (SANTYL) ointment   Topical Daily Persons, Bevely Palmer, [place]   Given at 10/14/20 1025   cycloSPORINE (RESTASIS) 0.05 % ophthalmic emulsion 1 drop  1 drop Both Eyes Daily Persons, Bevely Palmer, PA   1 drop at 10/14/20 1025   [START ON 10/16/2020] Darbepoetin Alfa (ARANESP) injection 150 mcg  150 mcg Intravenous Q Thu-HD Persons, Bevely Palmer, PA       guaiFENesin-dextromethorphan (ROBITUSSIN DM) 100-10 MG/5ML syrup 15 mL  15 mL Oral Q4H PRN Persons, Bevely Palmer, PA       heparin injection 5,000 Units  5,000 Units Subcutaneous Q8H Persons, Bevely Palmer, [place]   5,000 Units at 10/14/20 0602   insulin aspart (novoLOG) injection 0-6 Units  0-6 Units Subcutaneous Q4H Persons, Bevely Palmer, PA   1 Units at 10/12/20 1639   insulin glargine (LANTUS) injection 5 Units  5 Units Subcutaneous Daily  Kathie Dike, MD   5 Units at 10/14/20 1021   lidocaine (LIDODERM) 5 % 1 patch  1 patch Transdermal Q24H Shelly Coss, MD   1 patch at 10/14/20 1051   lidocaine (XYLOCAINE) 5 % ointment 1 application  1 application Topical BID PRN Persons, Bevely Palmer, PA       menthol-cetylpyridinium (CEPACOL) lozenge 3 mg  1 lozenge Oral PRN Kathie Dike, MD   3 mg at 10/14/20 1024   midodrine (PROAMATINE) tablet 5 mg  5 mg Oral Q T,Th,Sat-1800 Persons, Bevely Palmer, PA   5 mg at 10/11/20 1729   mupirocin ointment (BACTROBAN) 2 % 1 application  1 application Topical Daily PRN Persons, Bevely Palmer, PA       nutrition supplement (JUVEN) (JUVEN)  powder packet 1 packet  1 packet Oral BID BM Persons, Bevely Palmer, [place]   1 packet at 10/12/20 1319   ondansetron (ZOFRAN) injection 4 mg  4 mg Intravenous Q6H PRN Persons, Bevely Palmer, PA       ondansetron [facility]) tablet 4 mg  4 mg Oral TID PRN Persons, Bevely Palmer, PA       oxyCODONE-acetaminophen (PERCOCET/ROXICET) 5-325 MG per tablet 1 tablet  1 tablet Oral Q6H PRN Kathie Dike, MD   1 tablet at 10/14/20 0602   And   oxyCODONE (Oxy IR/ROXICODONE) immediate release tablet 5 mg  5 mg Oral Q6H PRN Kathie Dike, MD   5 mg at 10/14/20 0602   pantoprazole (PROTONIX) EC tablet 40 mg  40 mg Oral Daily Persons, Bevely Palmer, PA   40 mg at 10/14/20 1021   phenol (CHLORASEPTIC) mouth spray 1 spray  1 spray Mouth/Throat PRN Persons, Bevely Palmer, PA       polyethylene glycol (MIRALAX / GLYCOLAX) packet 17 g  17 g Oral Daily PRN Kathie Dike, MD       polyvinyl alcohol (LIQUIFILM TEARS) 1.4 % ophthalmic solution 1 drop  1 drop Both Eyes Daily PRN Persons, Bevely Palmer, PA       sucroferric oxyhydroxide [facility]) chewable tablet 500 mg  500 mg Oral Q breakfast Persons, Bevely Palmer, PA   500 mg at 10/09/20 1005   zinc sulfate capsule 220 mg  220 mg Oral Daily Persons, Bevely Palmer, PA   220 mg at 10/14/20 1021     Discharge Medications: Please see discharge summary for a list of discharge  medications.  Relevant Imaging Results:  Relevant Lab Results:   Additional Information SSN: [id number]  Emeterio Reeve, LCSW

## 2020-10-14 NOTE — TOC Initial Note (Addendum)
Transition of Care [facility]) - Initial/Assessment Note    Patient Details  Name: [name] [name] MRN: [id number] Date of Birth: [birth year]  Transition of Care [facility]) CM/SW Contact:    Gabrielle Dare Phone Number: 10/14/2020, 1:34 PM  Clinical Narrative:                 CSW spoke with pt and pt's mother at bedside concerning pt recommendations for SNF.  Pt's mother lives with pt in a 2 story home.  Pt stays on the 2nd level of the home.  Pt is agreeable for possible SNF placement.  Pt has received 2 doses of pfizer vaccine and 1 booster.  Pt would like to receive booster here at hospital.  CSW will contact pt's physician to update.  TOC will continue to assist with disposition planning.  Expected Discharge Plan: Skilled Nursing Facility Barriers to Discharge: Ship broker, Continued Medical Work up, SNF Pending bed offer   Patient Goals and CMS Choice Patient states their goals for this hospitalization and ongoing recovery are:: To be ablut to walk CMS Medicare.gov Compare Post Acute Care list provided to:: Patient Choice offered to / list presented to : Patient  Expected Discharge Plan and Services Expected Discharge Plan: [place] In-house Referral: Clinical Social Work     Living arrangements for the past 2 months: Single Family Home                                      Prior Living Arrangements/Services Living arrangements for the past 2 months: Single Family Home Lives with:: Parents Patient language and need for interpreter reviewed:: Yes Do you feel safe going back to the place where you live?: Yes      Need for Family Participation in Patient Care: Yes (Comment) Care giver support system in place?: Yes (comment) Current home services: Homehealth aide Criminal Activity/Legal Involvement Pertinent to Current Situation/Hospitalization: No - Comment as needed  Activities of Daily Living Home Assistive Devices/Equipment: Insulin Pump,  CBG Meter ADL Screening (condition at time of admission) Patient's cognitive ability adequate to safely complete daily activities?: Yes Is the patient deaf or have difficulty hearing?: No Does the patient have difficulty seeing, even when wearing glasses/contacts?: No Does the patient have difficulty concentrating, remembering, or making decisions?: No Patient able to express need for assistance with ADLs?: Yes Does the patient have difficulty dressing or bathing?: Yes Independently performs ADLs?: No Communication: Independent Dressing (OT): Dependent Is this a change from baseline?: Pre-admission baseline Grooming: Dependent Is this a change from baseline?: Pre-admission baseline Feeding: Needs assistance Is this a change from baseline?: Pre-admission baseline Bathing: Dependent Is this a change from baseline?: Pre-admission baseline Toileting: Dependent Is this a change from baseline?: Pre-admission baseline In/Out Bed: Dependent Is this a change from baseline?: Pre-admission baseline Walks in Home: Dependent Is this a change from baseline?: Pre-admission baseline Does the patient have difficulty walking or climbing stairs?: Yes Weakness of Legs: Both Weakness of Arms/Hands: Both  Permission Sought/Granted Permission sought to share information with : Case Manager, Customer service manager, Family Supports Permission granted to share information with : Yes, Verbal Permission Granted  Share Information with NAME: Volvy Saller  Permission granted to share info w AGENCY: SNF's  Permission granted to share info w Relationship: Mother  Permission granted to share info w Contact Information: Glory [place], Mother  Emotional Assessment Appearance:: Appears stated age  Attitude/Demeanor/Rapport: Gracious, Engaged Affect (typically observed): Appropriate, Pleasant Orientation: : Oriented to Self, Oriented to Place, Oriented to  Time, Oriented to Situation Alcohol /  Substance Use: Not Applicable Psych Involvement: No (comment)  Admission diagnosis:  CHF (congestive heart failure) ([name]) [I50.9] Osteomyelitis ([name]) [M86.9] Foot abrasion, infected, right, initial encounter [id number], L08.9] Patient Active Problem List   Diagnosis Date Noted   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 ([place])    Chronic osteomyelitis of foot ([place])    Cutaneous abscess of foot    Foot abrasion, infected, right, initial encounter    Osteomyelitis of foot, left, acute ([place]) 09/26/2020   Osteomyelitis of foot, right, acute ([place]) 09/26/2020   Stage III pressure ulcer of sacral region ([place]) [id number]   Acute systolic CHF (congestive heart failure) ([place]) 09/26/2020   Acute on chronic systolic congestive heart failure ([name])    Pressure injury of skin 09/24/2020   Volume overload 09/23/2020   Osteomyelitis (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)    Acute GI bleeding 10/13/2017   ESRD on hemodialysis ([place]) 10/13/2017   Embolism, arterial ([place]) 10/12/2017   Ischemia of finger 10/02/2017   Deceased-donor kidney transplant 06/04/2016   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[place]) 06/04/2016   Diabetes mellitus type 2, uncontrolled ([place]) 05/03/2014   Non-ischemic cardiomyopathy ([place]) 05/03/2014   Type 2 diabetes mellitus with hyperglycemia ([place]) 05/03/2014   Type II diabetes mellitus with renal manifestations ([place]) 02/01/2011    Mechanical complication of other vascular device, implant, and graft 02/01/2011   HYPERCHOLESTEROLEMIA 08/19/2009   Essential hypertension [id number]   Chronic systolic heart failure ([place]) 08/19/2009   CKD (chronic kidney disease) stage V requiring chronic dialysis ([place]) 08/19/2009   Depression 08/19/2009   PCP:  [name], MD Pharmacy[facility]   [id number] [id number]: [phone]: [phone]    Social Determinants of Health (SDOH) Interventions    Readmission Risk Interventions No flowsheet data found.

## 2020-10-14 NOTE — Progress Notes (Signed)
Physical Therapy Treatment Patient Details Name: [name] [name] MRN: [id number] DOB: [birth year] Today's Date: 10/14/2020    History of Present Illness 55 y/o male with history of IDDM, ESRD who was recently admitted with bilateral heel osteomyelitis and necrotic ulcers. He refused b/l BKA at that time. He was readmitted with worsening bilateral foot pain. He was seen by orthopedics and podiatry who are recommending bilateral bka. Patient is now S/P B BKA.    PT Comments    Pt supine in bed with complaints of 9/10 pain in B LE despite receiving pain medication. Pt request PT look at residual limb to make sure "everything is OK" B limb protectors noted to be slid out of place. Readjusted placement and had pt assist reluctantly by providing AROM to flex hip to slide protectors up. Pt requires maxAx2 for rolling and scooting up in bed to position pt on R side to decrease pressure on back and relieve pain. Educated pt and mother on phantom pain and providing visual and tactile feedback. Pt currently refusing to look and residual limbs. D/c plan needs to be changed as CIR has denied request. PT will continue to follow acutely.      Follow Up Recommendations  SNF     Equipment Recommendations  None recommended by PT       Precautions / Restrictions Precautions Precautions: Fall Other Brace: B Limb protectors Restrictions Weight Bearing Restrictions: Yes RLE Weight Bearing: Non weight bearing LLE Weight Bearing: Non weight bearing    Mobility  Bed Mobility Overal bed mobility: Needs Assistance Bed Mobility: Rolling Rolling: Max assist;+2 for physical assistance   Supine to sit: Max assist Sit to supine: Max assist   General bed mobility comments: maxAx2 for rolling onto side to offweight painful back, reports pain is too great to try moving, pt able to provide assist to scoot to HoB by pulling up on head board    Transfers                 General transfer comment: uanble to  attempt this session, pt limited by pain                        Balance Overall balance assessment: Needs assistance Sitting-balance support: Bilateral upper extremity supported;Feet unsupported Sitting balance-Leahy Scale: Poor Sitting balance - Comments: requiresmin guard A -  close supervision while sitting edge of bed.                                    Cognition Arousal/Alertness: Awake/alert Behavior During Therapy: Anxious Overall Cognitive Status: Difficult to assess Area of Impairment: Following commands;Problem solving                       Following Commands: Follows one step commands with increased time Safety/Judgement: Decreased awareness of safety;Decreased awareness of deficits   Problem Solving: Slow processing;Requires verbal cues;Requires tactile cues;Decreased initiation        Exercises Other Exercises Other Exercises: hip flexion to aid in adjusting limb protectors    General Comments General comments (skin integrity, edema, etc.): Pt refuses bed mobility but complains of pain in B LE where limb protectors have slid down, with total Ax2 limb protectors opened and limbs placed correctly, pt reports some pain relief. pt's mother present throughout session concerned whether therapy allowed to adjust limb protectors. Educated mother on proper  placment of limb protectors by using knee as a guide and asking for adjustment if they slide down in the future. Also educated pt and mother on phantom pains and need for visual and tactile feedback to decrease prevalence of this type of pain      Pertinent Vitals/Pain Pain Assessment: 0-10 Pain Score: 9  Pain Location: B LE and back Pain Descriptors / Indicators: Discomfort;Grimacing;Shooting Pain Intervention(s): Limited activity within patient's tolerance;Monitored during session;Repositioned     PT Goals (current goals can now be found in the care plan section) Acute Rehab PT  Goals Patient Stated Goal: to decrease pain PT Goal Formulation: With patient Time For Goal Achievement: 10/24/20 Potential to Achieve Goals: Fair Progress towards PT goals: Not progressing toward goals - comment (limited by pain)    Frequency    Min 3X/week      PT Plan Discharge plan needs to be updated       AM-PAC PT "6 Clicks" Mobility   Outcome Measure  Help needed turning from your back to your side while in a flat bed without using bedrails?: A Lot Help needed moving from lying on your back to sitting on the side of a flat bed without using bedrails?: A Lot Help needed moving to and from a bed to a chair (including a wheelchair)?: Total Help needed standing up from a chair using your arms (e.g., wheelchair or bedside chair)?: Total Help needed to walk in hospital room?: Total Help needed climbing 3-5 steps with a railing? : Total 6 Click Score: 8    End of Session   Activity Tolerance: Patient limited by pain Patient left: in bed;with call bell/phone within reach;with family/visitor present;with bed alarm set Nurse Communication: Mobility status PT Visit Diagnosis: Other abnormalities of gait and mobility (R26.89);Pain Pain - Right/Left:  (bilateral) Pain - part of body: Leg     Time: 1413-1440 PT Time Calculation (min) (ACUTE ONLY): 27 min  Charges:  $Therapeutic Activity: 8-22 mins $Self Care/Home Management: 8-22                     [name] [name] PT, DPT Acute Rehabilitation Services Pager [phone] Office [phone]    [place] 10/14/2020, 3:35 PM

## 2020-10-14 NOTE — Progress Notes (Signed)
Occupational Therapy Treatment Patient Details Name: [name] [name] MRN: [id number] DOB: Jul 28, 1965 Today's Date: 10/14/2020    History of present illness 55 y/o male with history of IDDM, ESRD who was recently admitted with bilateral heel osteomyelitis and necrotic ulcers. He refused b/l BKA at that time. He was readmitted with worsening bilateral foot pain. He was seen by orthopedics and podiatry who are recommending bilateral bka. Patient is now S/P B BKA.   OT comments  Pt making slow progress with functional goals. Pt limited by pain but agreeable to EOB activity. Pt sat EOB max A with LEs using pad underneath pt to EOB and to elevate trunk. Max A to scoot to [facility] once returned to supine with pt using rails. Increased time and effort for bed mobility overall. Pt sat EOB x 7 minutes with min guard A - close sup for balance and safety. Max A rolling to L side in bed for RN to apply lidocaine patch. OT will continue to follow acutely to maximize level of function and safety  Follow Up Recommendations  SNF (Pt/family prefer to pursue SNF options for post acute rehab)    Equipment Recommendations  Other (comment) (TBD at SNF)    Recommendations for Other Services      Precautions / Restrictions Precautions Precautions: Fall Other Brace: B Limb protectors Restrictions Weight Bearing Restrictions: Yes RLE Weight Bearing: Non weight bearing LLE Weight Bearing: Non weight bearing       Mobility Bed Mobility Overal bed mobility: Needs Assistance Bed Mobility: Supine to Sit;Sit to Supine;Rolling Rolling: Max assist   Supine to sit: Max assist Sit to supine: Max assist   General bed mobility comments: max A with LEs using pad underneath pt to EOB and to elevate trunk. Max A to scoot to [facility] once returned to supine with pt using rails. Increased time and effort for bed mobility overall. Pt sat EOB x 7 minutes with mn guard A - close sup for balance and safety. Max A rolling to L sdie in  bef for RN to apply lidocaine patch    Transfers                 General transfer comment: uanble to attempt this session, pt limited by pain    Balance Overall balance assessment: Needs assistance Sitting-balance support: Bilateral upper extremity supported;Feet unsupported Sitting balance-Leahy Scale: Poor Sitting balance - Comments: requiresmin guard A -  close supervision while sitting edge of bed.                                   ADL either performed or assessed with clinical judgement   ADL Overall ADL's : Needs assistance/impaired     Grooming: Wash/dry hands;Wash/dry face;Min guard;Sitting           Upper Body Dressing : Minimal assistance;Sitting Upper Body Dressing Details (indicate cue type and reason): donned clean gown                   General ADL Comments: Pt tolerated sitting EOB x 7 minutes     Vision Baseline Vision/History: No visual deficits Patient Visual Report: No change from baseline     Perception     Praxis      Cognition Arousal/Alertness: Awake/alert Behavior During Therapy: Anxious Overall Cognitive Status: Difficult to assess Area of Impairment: Following commands;Problem solving  Following Commands: Follows one step commands with increased time Safety/Judgement: Decreased awareness of safety;Decreased awareness of deficits   Problem Solving: Slow processing;Requires verbal cues;Requires tactile cues;Decreased initiation          Exercises     Shoulder Instructions       General Comments      Pertinent Vitals/ Pain       Pain Intervention(s): Limited activity within patient's tolerance;Monitored during session;Patient requesting pain meds-RN notified;Repositioned  Home Living                                          Prior Functioning/Environment              Frequency  Min 2X/week        Progress Toward Goals  OT Goals(current  goals can now be found in the care plan section)  Progress towards OT goals: Progressing toward goals  Acute Rehab OT Goals Patient Stated Goal: to decrease pain  Plan Discharge plan remains appropriate    Co-evaluation                 AM-PAC OT "6 Clicks" Daily Activity     Outcome Measure   Help from another person eating meals?: None Help from another person taking care of personal grooming?: A Little Help from another person toileting, which includes using toliet, bedpan, or urinal?: Total Help from another person bathing (including washing, rinsing, drying)?: A Lot Help from another person to put on and taking off regular upper body clothing?: A Little Help from another person to put on and taking off regular lower body clothing?: A Lot 6 Click Score: 15    End of Session    OT Visit Diagnosis: Other abnormalities of gait and mobility (R26.89);Muscle weakness (generalized) (M62.81);Pain Pain - Right/Left:  (Bilaterally) Pain - part of body: Leg   Activity Tolerance Patient limited by lethargy;Patient limited by fatigue   Patient Left in bed;with call bell/phone within reach;with family/visitor present;with nursing/sitter in room;Other (comment) (SW present)   Nurse Communication          [id number] OT Time Calculation (min): 23 min  Charges: OT General Charges $OT Visit: 1 Visit OT Treatments $Self Care/Home Management : 8-22 mins $Therapeutic Activity: 8-22 mins   [place] 10/14/2020, 12:03 PM

## 2020-10-14 NOTE — Progress Notes (Signed)
Patient ID: [name] [name], male   DOB: [birth year], 55 y.o.   MRN: [id number] Patient is postoperative day 4 bilateral transtibial amputations.  There is a good suction fit and no drainage in the wound VAC canisters bilaterally.  Patient states he wants to go to [facility] for his skilled nursing.  We will plan for discharge with the portable [employer] wound VAC pumps x2.

## 2020-10-14 NOTE — Care Management Important Message (Signed)
Important Message  Patient Details  Name: [name] [name] MRN: [id number] Date of Birth: 1965/05/07   Medicare Important Message Given:  Yes     Krystyne Tewksbury P [place] 10/14/2020, 1:05 PM

## 2020-10-14 NOTE — Progress Notes (Addendum)
PROGRESS NOTE    [name] [name]  [id number] DOB: 1966/01/17 DOA: 10/07/2020 PCP: [name], MD   Chief Complain: Bilateral foot ulcers  Brief Narrative: Patient is a 55 year old male with history of insulin-dependent diabetes melitis, ESRD on dialysis, chronic bilateral heel osteomyelitis/necrotic ulcer was admitted for worsening bilateral foot pain.  He was recently admitted for the same but he refused bilateral BKA at that time.  Orthopedics was consulted during this admission and he underwent bilateral BKA.  Current plan is to discharge him skilled nursing facility.  Medically stable for discharge.  Nephrology following for dialysis.  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  Was recommended to have bilateral BKA on last admission but refused.  Started on broad-spectrum antibiotics on admission.  Orthopedics consulted and he underwent bilateral BKA, wound VAC on both stumps. He will be discharged on wound VAC.  He needs to follow-up with orthopedics as an outpatient.  ESRD on dialysis: Dialysis on TTS schedule.  Nephrology following.  Insulin-dependent diabetes type 2: Continue current insulin regimen.  Monitor blood sugars.  He is chronically on insulin pump.  Chronic normocytic anemia: Secondary to ESRD.  Currently hemoglobin stable.  He was transfused with a unit of PRBC preoperatively in anticipation for surgery.  Acute on chronic systolic congestive heart failure: Last known EF of 25 to 30%.  Volume management as per dialysis.  Constipation: Continue bowel regimen  Disposition: PT/OT recommending SNF on discharge.  Pressure Injury 10/08/20 Vertebral column Lower Unstageable - Full thickness tissue loss in which the base of the injury is  covered by slough (yellow, tan, gray, green or brown) and/or eschar (tan, brown or black) in the wound bed. (Active)  10/08/20 0100  Location: Vertebral column  Location Orientation: Lower  Staging: Unstageable - Full thickness tissue loss in which the base of the injury is covered by slough (yellow, tan, gray, green or brown) and/or eschar (tan, brown or black) in the wound bed.  Wound Description (Comments):   Present on Admission: Yes     Pressure Injury 10/08/20 Coccyx Unstageable - Full thickness tissue loss in which the base of the injury is covered by slough (yellow, tan, gray, green or brown) and/or eschar (tan, brown or black) in the wound bed. (Active)  10/08/20 0100  Location: Coccyx  Location Orientation:   Staging: Unstageable - Full thickness tissue loss in which the base of the injury is covered by slough (yellow, tan, gray, green or brown) and/or eschar (tan, brown or black) in the wound bed.  Wound Description (Comments):   Present on Admission: Yes     Pressure Injury Buttocks (Active)     Location: Buttocks  Location Orientation:   Staging:   Wound Description (Comments):   Present on Admission:      Pressure Injury Buttocks Bilateral Unstageable - Full thickness tissue loss in which the base of the injury is covered by slough (yellow, tan, gray, green or brown) and/or eschar (tan, brown or black) in the wound bed. (Active)     Location: Buttocks  Location Orientation: Bilateral  Staging: Unstageable - Full thickness tissue loss in which the base of the injury is covered by slough (yellow, tan, gray, green or brown) and/or eschar (tan, brown or black) in the wound bed.  Wound  Description (Comments):   Present on Admission: Yes     Pressure Injury Thigh Posterior;Proximal;Right Unstageable - Full thickness tissue loss in which the base of the injury is covered by slough (yellow, tan, gray, green or brown) and/or eschar (tan, brown or black) in the wound bed. (Active)      Location: Thigh  Location Orientation: Posterior;Proximal;Right  Staging: Unstageable - Full thickness tissue loss in which the base of the injury is covered by slough (yellow, tan, gray, green or brown) and/or eschar (tan, brown or black) in the wound bed.  Wound Description (Comments):   Present on Admission: Yes                DVT prophylaxis:Heparin [place] Code Status: Full Family Communication: Mom at bedside Status is: Inpatient  Remains inpatient appropriate because:Unsafe d/c plan  Dispo: The patient is from: Home              Anticipated d/c is to: SNF              Patient currently is medically stable to d/c.   Difficult to place patient No    Consultants: Nephrology, orthopedics  Procedures: Bilateral BKA  Antimicrobials:  Anti-infectives (From admission, onward)    Start     Dose/Rate Route Frequency Ordered Stop   10/10/20 1951  vancomycin (VANCOCIN) 1-5 GM/200ML-% IVPB       Note to Pharmacy: Obas, [place]   : cabinet override      10/10/20 1951 10/11/20 0759   10/10/20 1915  vancomycin (VANCOCIN) IVPB 1000 mg/200 mL premix        1,000 mg 200 mL/hr over 60 Minutes Intravenous Every Fri (Hemodialysis) 10/10/20 1902 10/10/20 2100   10/10/20 1902  vancomycin variable dose per unstable renal function (pharmacist dosing)  Status:  Discontinued         Does not apply See admin instructions 10/10/20 1902 10/11/20 1229   10/10/20 1000  ceFAZolin (ANCEF) IVPB 2g/100 mL premix        2 g 200 mL/hr over 30 Minutes Intravenous On call to O.R. 10/10/20 0903 10/10/20 1421   10/09/20 1200  vancomycin (VANCOCIN) IVPB 1000 mg/200 mL premix  Status:  Discontinued        1,000 mg 200 mL/hr over 60 Minutes Intravenous Every T-Th-Sa (Hemodialysis) 10/08/20 0308 10/10/20 1902   10/08/20 1200  vancomycin (VANCOCIN) IVPB 1000 mg/200 mL premix        1,000 mg 200 mL/hr over 60 Minutes Intravenous Every Wed (Hemodialysis) 10/08/20 0829 10/08/20 1133   10/08/20 0600   piperacillin-tazobactam (ZOSYN) IVPB 2.25 g        2.25 g 100 mL/hr over 30 Minutes Intravenous Every 8 hours 10/08/20 0308 10/11/20 2359   10/07/20 1515  piperacillin-tazobactam (ZOSYN) IVPB 3.375 g        3.375 g 100 mL/hr over 30 Minutes Intravenous  Once 10/07/20 1500 10/07/20 1707   10/07/20 1515  vancomycin (VANCOCIN) 2,250 mg in sodium chloride 0.9 % 500 mL IVPB        2,250 mg 250 mL/hr over 120 Minutes Intravenous  Once 10/07/20 1500 10/07/20 1931       Subjective:  Patient seen and examined at the bedside this morning.  Hemodynamically stable.  Comfortable.  Denies any complaints.  Mother was at the bedside.  Objective: Vitals:   10/14/20 0451 10/14/20 0516 10/14/20 0547 10/14/20 1033  BP:  (!) 131/97 (!) 127/99 129/79  Pulse:   95 97  Resp: (!) 24  $'19 18 18  'k$ Temp:  98.2 F (36.8 C) 98.3 F (36.8 C) 98 F (36.7 C)  TempSrc:  Oral Oral Oral  SpO2:  98% 100% 95%  Weight:        Intake/Output Summary (Last 24 hours) at 10/14/2020 1311 Last data filed at 10/14/2020 0800 Gross per 24 hour  Intake 240 ml  Output 3000 ml  Net -2760 ml   Filed Weights   10/08/20 0730 10/08/20 1148 10/10/20 0611  Weight: 95.8 kg 91 kg 91 kg    Examination:  General exam: Overall comfortable, not in distress.  Obese HEENT: PERRL Respiratory system:  no wheezes or crackles  Cardiovascular system: S1 & S2 heard, RRR.  Gastrointestinal system: Abdomen is nondistended, soft and nontender. Central nervous system: Alert and oriented Extremities: Bilateral BKA with wound VAC, dialysis access on right upper extremity Skin: pressure ulcers as above     Data Reviewed: I have personally reviewed following labs and imaging studies  CBC: Recent Labs  Lab 10/10/20 0122 10/11/20 0140 10/12/20 0330 10/13/20 0430 10/14/20 0647  WBC 10.8* 14.0* 13.8* 10.4 9.0  HGB 8.8* 11.6* 10.8* 10.0* 11.1*  HCT 29.9* 38.2* 36.1* 34.0* 36.8*  MCV 98.0 95.5 96.0 95.8 95.1  PLT 388 385 433* 437* 422*    Basic Metabolic Panel: Recent Labs  Lab 10/10/20 0122 10/11/20 0140 10/12/20 0330 10/13/20 0430 10/14/20 0647  NA 134* 135 134* 129* 134*  K 4.1 4.1 4.0 4.2 4.2  CL 96* 96* 96* 91* 93*  CO2 '23 22 23 23 27  '$ GLUCOSE 83 113* 186* 123* 99  BUN 36* 23* 42* 60* 34*  CREATININE 5.78* 4.26* 5.23* 5.96* 4.45*  CALCIUM 8.7* 8.6* 8.1* 7.7* 7.7*  PHOS 5.7* 5.5*  --  5.6* 4.2   GFR: Estimated Creatinine Clearance: 19.7 mL/min (A) (by C-G formula based on SCr of 4.45 mg/dL (H)). Liver Function Tests: Recent Labs  Lab 10/10/20 0122 10/11/20 0140 10/13/20 0430 10/14/20 0647  ALBUMIN 2.6* 2.3* 2.1* 2.1*   No results for input(s): LIPASE, AMYLASE in the last 168 hours. Recent Labs  Lab 10/09/20 1607  AMMONIA 38*   Coagulation Profile: No results for input(s): INR, PROTIME in the last 168 hours. Cardiac Enzymes: No results for input(s): CKTOTAL, CKMB, CKMBINDEX, TROPONINI in the last 168 hours. BNP (last 3 results) No results for input(s): PROBNP in the last 8760 hours. HbA1C: No results for input(s): HGBA1C in the last 72 hours. CBG: Recent Labs  Lab 10/08/20 0155 10/08/20 0641 10/10/20 1546 10/11/20 0406 10/12/20 2122  GLUCAP 250* 224* 97 117* 126*   Lipid Profile: No results for input(s): CHOL, HDL, LDLCALC, TRIG, CHOLHDL, LDLDIRECT in the last 72 hours. Thyroid Function Tests: No results for input(s): TSH, T4TOTAL, FREET4, T3FREE, THYROIDAB in the last 72 hours. Anemia Panel: No results for input(s): VITAMINB12, FOLATE, FERRITIN, TIBC, IRON, RETICCTPCT in the last 72 hours. Sepsis Labs: Recent Labs  Lab 10/07/20 2033  LATICACIDVEN 2.6*    Recent Results (from the past 240 hour(s))  Culture, blood (Routine x 2)     Status: None   Collection Time: 10/07/20  1:06 PM   Specimen: BLOOD  Result Value Ref Range Status   Specimen Description BLOOD LEFT ANTECUBITAL  Final   Special Requests   Final    BOTTLES DRAWN AEROBIC AND ANAEROBIC Blood Culture results may  not be optimal due to an inadequate volume of blood received in culture bottles   Culture   Final    NO GROWTH  5 DAYS Performed at [facility], [place] [address], [place], [place] [address]    Report Status 10/12/2020 FINAL  Final  SARS CORONAVIRUS 2 (TAT 6-24 HRS) Nasopharyngeal Nasopharyngeal Swab     Status: None   Collection Time: 10/07/20  4:39 PM   Specimen: Nasopharyngeal Swab  Result Value Ref Range Status   SARS Coronavirus 2 NEGATIVE NEGATIVE Final    Comment: (NOTE) SARS-CoV-2 target nucleic acids are NOT DETECTED.  The SARS-CoV-2 RNA is generally detectable in upper and lower respiratory specimens during the acute phase of infection. Negative results do not preclude SARS-CoV-2 infection, do not rule out co-infections with other pathogens, and should not be used as the sole basis for treatment or other patient management decisions. Negative results must be combined with clinical observations, patient history, and epidemiological information. The expected result is Negative.  Fact Sheet for Patients: SugarRoll.be  Fact Sheet for Healthcare Providers: https://www.woods-mathews.com/  This test is not yet approved or cleared by the [place] FDA and  has been authorized for detection and/or diagnosis of SARS-CoV-2 by FDA under an Emergency Use Authorization (EUA). This EUA will remain  in effect (meaning this test can be used) for the duration of the COVID-19 declaration under Se ction 564(b)(1) of the Act, 21 U.S.C. section 360bbb-3(b)(1), unless the authorization is terminated or revoked sooner.  Performed at [facility], [place] [address] [place], [place] [address]   Culture, blood (Routine x 2)     Status: None   Collection Time: 10/07/20  8:07 PM   Specimen: BLOOD LEFT HAND  Result Value Ref Range Status   Specimen Description BLOOD LEFT HAND  Final   Special Requests   Final    BOTTLES DRAWN AEROBIC AND  ANAEROBIC Blood Culture adequate volume   Culture   Final    NO GROWTH 5 DAYS Performed at [facility], [place] [address] [place], [place] [address]    Report Status 10/12/2020 FINAL  Final  Culture, blood (single)     Status: None   Collection Time: 10/08/20  4:04 AM   Specimen: BLOOD  Result Value Ref Range Status   Specimen Description BLOOD SITE NOT SPECIFIED  Final   Special Requests   Final    BOTTLES DRAWN AEROBIC AND ANAEROBIC Blood Culture adequate volume   Culture   Final    NO GROWTH 5 DAYS Performed at [facility], [place] [address] [place], [place] [address]    Report Status 10/13/2020 FINAL  Final  Surgical pcr screen     Status: None   Collection Time: 10/10/20  9:12 AM   Specimen: Nasal Mucosa; Nasal Swab  Result Value Ref Range Status   MRSA, PCR NEGATIVE NEGATIVE Final   Staphylococcus aureus NEGATIVE NEGATIVE Final    Comment: (NOTE) The Xpert SA Assay (FDA approved for NASAL specimens in patients 55 years of age and older), is one component of a comprehensive surveillance program. It is not intended to diagnose infection nor to guide or monitor treatment. Performed at [facility], [place] [address], [place], [place] [address]          Radiology Studies: No results found.      Scheduled Meds:  (feeding supplement) PROSource Plus  30 mL Oral BID BM   vitamin C  1,000 mg Oral Daily   aspirin  325 mg Oral Daily   atorvastatin  10 mg Oral QPM   calcium acetate  1,334 mg  Oral TID WC   Chlorhexidine Gluconate Cloth  6 each Topical Q0600   cinacalcet  30 mg Oral Q breakfast   collagenase   Topical Daily   cycloSPORINE  1 drop Both Eyes Daily   [START ON 10/16/2020] darbepoetin (ARANESP) injection - DIALYSIS  150 mcg Intravenous Q Thu-HD   heparin  5,000 Units Subcutaneous Q8H   insulin aspart  0-6 Units Subcutaneous Q4H   insulin glargine  5 Units Subcutaneous Daily   lidocaine  1 patch Transdermal Q24H   midodrine  5 mg Oral Q  T,Th,Sat-1800   nutrition supplement (JUVEN)  1 packet Oral BID BM   pantoprazole  40 mg Oral Daily   sucroferric oxyhydroxide  500 mg Oral Q breakfast   zinc sulfate  220 mg Oral Daily   Continuous Infusions:   LOS: 7 days    Time spent:25 mins. More than 50% of that time was spent in counseling and/or coordination of care.      Shelly Coss, MD [facility] P7/26/2022, 1:11 PM

## 2020-10-14 NOTE — Progress Notes (Signed)
[facility] Progress Note   Subjective:  Seen in room, no new c/o's.  Had HD overnight last night.   Objective Vitals:   10/14/20 0451 10/14/20 0516 10/14/20 0547 10/14/20 1033  BP:  (!) 131/97 (!) 127/99 129/79  Pulse:   95 97  Resp: (!) '24 19 18 18  '$ Temp:  98.2 F (36.8 C) 98.3 F (36.8 C) 98 F (36.7 C)  TempSrc:  Oral Oral Oral  SpO2:  98% 100% 95%  Weight:       Physical Exam General: Chronically ill appearing man, NAD. Room air.  Heart: RRR; no murmur Lungs: CTA anteriorly Abdomen: soft, non-tender Extremities: 2-3+ bilat hip edema, bilat BKA Dialysis Access: R AVF + bruit  Additional Objective Labs: Basic Metabolic Panel: Recent Labs  Lab 10/11/20 0140 10/12/20 0330 10/13/20 0430 10/14/20 0647  NA 135 134* 129* 134*  K 4.1 4.0 4.2 4.2  CL 96* 96* 91* 93*  CO2 '22 23 23 27  '$ GLUCOSE 113* 186* 123* 99  BUN 23* 42* 60* 34*  CREATININE 4.26* 5.23* 5.96* 4.45*  CALCIUM 8.6* 8.1* 7.7* 7.7*  PHOS 5.5*  --  5.6* 4.2    Liver Function Tests: Recent Labs  Lab 10/11/20 0140 10/13/20 0430 10/14/20 0647  ALBUMIN 2.3* 2.1* 2.1*    CBC: Recent Labs  Lab 10/10/20 0122 10/11/20 0140 10/12/20 0330 10/13/20 0430 10/14/20 0647  WBC 10.8* 14.0* 13.8* 10.4 9.0  HGB 8.8* 11.6* 10.8* 10.0* 11.1*  HCT 29.9* 38.2* 36.1* 34.0* 36.8*  MCV 98.0 95.5 96.0 95.8 95.1  PLT 388 385 433* 437* 422*    Iron Studies:  No results for input(s): IRON, TIBC, TRANSFERRIN, FERRITIN in the last 72 hours.  Medications:    (feeding supplement) PROSource Plus  30 mL Oral BID BM   vitamin C  1,000 mg Oral Daily   aspirin  325 mg Oral Daily   atorvastatin  10 mg Oral QPM   calcium acetate  1,334 mg Oral TID WC   Chlorhexidine Gluconate Cloth  6 each Topical Q0600   cinacalcet  30 mg Oral Q breakfast   collagenase   Topical Daily   cycloSPORINE  1 drop Both Eyes Daily   [START ON 10/16/2020] darbepoetin (ARANESP) injection - DIALYSIS  150 mcg Intravenous Q  Thu-HD   heparin  5,000 Units Subcutaneous Q8H   insulin aspart  0-6 Units Subcutaneous Q4H   insulin glargine  5 Units Subcutaneous Daily   lidocaine  1 patch Transdermal Q24H   midodrine  5 mg Oral Q T,Th,Sat-1800   nutrition supplement (JUVEN)  1 packet Oral BID BM   pantoprazole  40 mg Oral Daily   sucroferric oxyhydroxide  500 mg Oral Q breakfast   zinc sulfate  220 mg Oral Daily    Dialysis Orders: [facility] --> need to clarify  3h 16mn  450/1.5  90kg  2/2.5 bath AVF  Hep 6000 +500u/hr - Aranesp 1070m IV q Thur - Zemplar 73m63mIV q HD   Assessment/Plan: B heel wounds/osteomyelitis: Prev recommendation was B BKA and had refused initially then agreed and is SP B BKA Dr. DudSharol Given 7/22. Volume: has significant hip edema, bilat BKA should lower edw by about 5-7kg, therefore is still up about 5-6kg. 3L off overnight. Cont to lower vol as tolerated  ESRD: Usual TTS sched, off schedule here on MWF. Next HD Wed , then prob will do Thursday as well to get back on schedule.  Hypertension: No antihypertensive meds - on mido '5mg'$  pre  Anemia: Hgb 8.8 - will continue weekly Aranesp. SP prbc's yesterday  Metabolic bone disease: Ca ok, Phos ok. Continue binders (Phoslo), sensipar.  Nutrition: Alb very low - continue supplements.  Sacral wound: Noted during last admit. Per primary.  Hx finger amputations, ischemic  HFrEF (25-30%): Perhaps will improve once volume corrected  Myoclonic jerking: lowered dose then dc'd altogether for now. Jerking resolved.  Dispo - plan is for SNF placement   Kelly Splinter, MD 10/14/2020, 1:19 PM

## 2020-10-15 DIAGNOSIS — M86679 Other chronic osteomyelitis, unspecified ankle and foot: Secondary | ICD-10-CM | POA: Diagnosis not present

## 2020-10-15 MED ORDER — OXYCODONE-ACETAMINOPHEN 10-325 MG PO TABS: 1.0000 | ORAL_TABLET | Freq: Four times a day (QID) | ORAL | 0 refills | Status: DC | PRN

## 2020-10-15 MED ORDER — HEPARIN SODIUM (PORCINE) 1000 UNIT/ML DIALYSIS: 2000.0000 [IU] | INTRAMUSCULAR | Status: DC | PRN

## 2020-10-15 NOTE — TOC Progression Note (Signed)
Transition of Care [facility]) - Progression Note    Patient Details  Name: CACEY BARTZ MRN: [id number] Date of Birth: [birth year]  [facility] CM/SW Contact  Loletha Grayer Beverely Pace, RN Phone Number: 10/15/2020, 4:39 PM  Clinical Narrative:   Case manager spoke with patient's mom concerning her requests for needs at home for patient. SHe states that they will receive [facility] from [facility].. Mrs. Sowells said they need a ramp for stairs in the front and the back, the need a chair lift to get patient upstairs, bedrooms are un upper level. She requests transportation for patient to and from dialysis and to Dr. Thomasene Lot, whoever is transporting will have to get patient from upstairs. CM attempted to explain about availability of her requests, and that we will have to discuss other options. CM has updated MD and Education officer, museum. Will contact Renal Navigator as well.    Expected Discharge Plan: Skilled Nursing Facility Barriers to Discharge: Ship broker, Continued Medical Work up, SNF Pending bed offer  Expected Discharge Plan and Services Expected Discharge Plan: [place] In-house Referral: Clinical Social Work     Living arrangements for the past 2 months: Single Family Home Expected Discharge Date: 10/15/20                                     Social Determinants of Health (SDOH) Interventions    Readmission Risk Interventions No flowsheet data found.

## 2020-10-15 NOTE — Progress Notes (Signed)
[facility] Progress Note   Subjective:  Seen on HD, tolerting 4 L UF goal   Objective Vitals:   10/15/20 1000 10/15/20 1030 10/15/20 1100 10/15/20 1229  BP: 125/76 132/76 130/77 125/69  Pulse: 89 90 90 91  Resp:    18  Temp:    98.1 F (36.7 C)  TempSrc:    Oral  SpO2:    98%  Weight:       Physical Exam General: Chronically ill appearing man, NAD. Room air.  Heart: RRR; no murmur Lungs: CTA anteriorly Abdomen: soft, non-tender Extremities: 2-3+ bilat hip edema, bilat BKA Dialysis Access: R AVF + bruit  Additional Objective Labs: Basic Metabolic Panel: Recent Labs  Lab 10/11/20 0140 10/12/20 0330 10/13/20 0430 10/14/20 0647  NA 135 134* 129* 134*  K 4.1 4.0 4.2 4.2  CL 96* 96* 91* 93*  CO2 '22 23 23 27  '$ GLUCOSE 113* 186* 123* 99  BUN 23* 42* 60* 34*  CREATININE 4.26* 5.23* 5.96* 4.45*  CALCIUM 8.6* 8.1* 7.7* 7.7*  PHOS 5.5*  --  5.6* 4.2    Liver Function Tests: Recent Labs  Lab 10/11/20 0140 10/13/20 0430 10/14/20 0647  ALBUMIN 2.3* 2.1* 2.1*    CBC: Recent Labs  Lab 10/10/20 0122 10/11/20 0140 10/12/20 0330 10/13/20 0430 10/14/20 0647  WBC 10.8* 14.0* 13.8* 10.4 9.0  HGB 8.8* 11.6* 10.8* 10.0* 11.1*  HCT 29.9* 38.2* 36.1* 34.0* 36.8*  MCV 98.0 95.5 96.0 95.8 95.1  PLT 388 385 433* 437* 422*    Iron Studies:  No results for input(s): IRON, TIBC, TRANSFERRIN, FERRITIN in the last 72 hours.  Medications:    (feeding supplement) PROSource Plus  30 mL Oral BID BM   vitamin C  1,000 mg Oral Daily   aspirin  325 mg Oral Daily   atorvastatin  10 mg Oral QPM   calcium acetate  1,334 mg Oral TID WC   Chlorhexidine Gluconate Cloth  6 each Topical Q0600   cinacalcet  30 mg Oral Q breakfast   collagenase   Topical Daily   cycloSPORINE  1 drop Both Eyes Daily   [START ON 10/16/2020] darbepoetin (ARANESP) injection - DIALYSIS  150 mcg Intravenous Q Thu-HD   heparin  5,000 Units Subcutaneous Q8H   insulin aspart  0-6 Units  Subcutaneous Q4H   insulin glargine  5 Units Subcutaneous Daily   lidocaine  1 patch Transdermal Q24H   midodrine  5 mg Oral Q T,Th,Sat-1800   nutrition supplement (JUVEN)  1 packet Oral BID BM   pantoprazole  40 mg Oral Daily   sucroferric oxyhydroxide  500 mg Oral Q breakfast   zinc sulfate  220 mg Oral Daily    Dialysis Orders: T[facility] at [facility] --> need to clarify  3h 73mn  450/1.5  90kg  2/2.5 bath AVF  Hep 6000 +500u/hr - Aranesp 1050m IV q Thur - Zemplar 31m18mIV q HD   Assessment/Plan: B heel wounds/osteomyelitis: Prev recommendation was B BKA and had refused initially then agreed and is SP B BKA Dr. DudSharol Given 7/22. Volume: has significant hip edema, bilat BKA should lower edw by about 5-7kg, therefore is still up about 5-6kg. Tolerating 4L UF goal today on HD.   ESRD: Usual TTS sched, off schedule here on MWF. HD today and short HD again tomorrow to get back on schedule.   Hypertension: No antihypertensive meds - on mido '5mg'$  pre  Anemia: Hgb 8.8 - will continue weekly Aranesp.  SP prbc's yesterday  Metabolic bone disease: Ca ok, Phos ok. Continue binders (Phoslo), sensipar.  Nutrition: Alb very low - continue supplements.  Sacral wound: Noted during last admit. Per primary.  Hx finger amputations, ischemic  HFrEF (25-30%): Perhaps will improve once volume corrected  Myoclonic jerking: lowered dose then dc'd altogether. Jerking resolved.  Dispo - plan is for SNF placement , pending   Kelly Splinter, MD 10/15/2020, 12:40 PM

## 2020-10-15 NOTE — Discharge Summary (Addendum)
Physician Discharge Summary  [name] [name] [id number] DOB: November 10, 1965 DOA: 10/07/2020  PCP: [name], MD  Admit date: 10/07/2020 Discharge date: 10/16/20  Admitted From: Home Disposition: SNF  Discharge Condition:Stable CODE STATUS:FULL Diet recommendation: Heart Healthy   Brief/Interim Summary: Patient is a 55 year old male with history of insulin-dependent diabetes melitis, ESRD on dialysis, chronic bilateral heel osteomyelitis/necrotic ulcer was admitted for worsening bilateral foot pain.  He was recently admitted for the same but he refused bilateral BKA at that time.  Orthopedics was consulted during this admission and he underwent bilateral BKA.  Current plan is to discharge him skilled nursing facility.  Medically stable for discharge as soon as bed is available.  Nephrology following for dialysis.  Following problems were addressed during his hospitalization:   Bilateral heel osteo/abscess/necrotic ulcers: Presented with worsening pain.  Was recommended to have bilateral BKA on last admission but refused.  Started on broad-spectrum antibiotics on admission.  Orthopedics consulted and he underwent bilateral BKA, wound VAC on both stumps. He will be discharged on wound VAC.  He needs to follow-up with orthopedics as an outpatient in a week   ESRD on dialysis: Dialysis on TTS schedule.  Nephrology  following. Continue Sensipar and PhosLo   Insulin-dependent diabetes type 2:  He is chronically on insulin pump.Shd be continued on dc   Chronic normocytic anemia: Secondary to ESRD.  Currently hemoglobin stable.  He was transfused with a unit of PRBC preoperatively in anticipation for surgery.   Acute on chronic systolic congestive heart failure: Last known EF of 25 to 30%.  Volume management as per dialysis.  Disposition: PT/OT recommending SNF on discharge.  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  Name and number of the provider has been attached   Increase activity slowly   Complete by: As directed    Negative Pressure Wound Therapy - Incisional   Complete by: As directed    Preveena vacs x 2. Show patient how to attach vacs   No wound care   Complete by: As directed    As per wound nurse      Allergies as of 10/16/2020   No Known Allergies      Medication List     STOP taking these medications    doxycycline 100 MG tablet Commonly known as: ADOXA   metroNIDAZOLE 500 MG tablet Commonly known as: Flagyl   traMADol 50 MG tablet Commonly known as: ULTRAM       TAKE these medications    aspirin 325 MG tablet Take 325 mg by mouth daily.   atorvastatin 10 MG tablet Commonly known as: LIPITOR Take 10 mg by mouth every evening.   calcium acetate 667 MG capsule Commonly known as: PHOSLO Take 1,334 mg by mouth 3 (three) times daily with meals.   Cequa 0.09 % Soln Generic drug: cycloSPORINE (PF) Place 1 drop into both eyes daily.   cinacalcet 30 MG tablet Commonly known as: SENSIPAR Take 30 mg by mouth daily.   FreeStyle [employer] 14 Day Sensor Misc See admin instructions.   gabapentin 300 MG  capsule Commonly known as: NEURONTIN Take 300 mg by mouth 3 (three) times daily.   insulin lispro 100 UNIT/ML injection Commonly known as: HUMALOG Inject into the skin See admin instructions. PUMP   lidocaine 5 % ointment Commonly known as: XYLOCAINE Apply 1 application topically 2 (two) times daily as needed (finger pain).   midodrine 5 MG tablet Commonly known as: PROAMATINE Take 5 mg by mouth every Tuesday, Thursday, and Saturday at 6 PM.   multivitamin  with minerals Tabs tablet Take 1 tablet by mouth daily in the afternoon.   mupirocin ointment 2 % Commonly known as: BACTROBAN Apply 1 application topically daily as needed (itching).   Omnipod DASH Pods (Gen 4) Misc Inject into the skin. Use with Humalog PUMP   ondansetron 4 MG tablet Commonly known as: ZOFRAN Take 4 mg by mouth 3 (three) times daily as needed for nausea or vomiting.   oxyCODONE-acetaminophen 10-325 MG tablet Commonly known as: PERCOCET Take 1 tablet by mouth every 6 (six) hours as needed for pain. What changed:  when to take this reasons to take this   Santyl ointment Generic drug: collagenase Apply 1 application topically daily.   Systane 0.4-0.3 % Soln Generic drug: Polyethyl Glycol-Propyl Glycol Place 1 drop into both eyes daily as needed (dry eyes).   Velphoro 500 MG chewable tablet Generic drug: sucroferric oxyhydroxide Chew 500 mg by mouth daily.   vitamin B-12 500 MCG tablet Commonly known as: CYANOCOBALAMIN Take 500 mcg by mouth daily.               Durable Medical Equipment  (From admission, onward)           Start     Ordered   10/08/20 0740  For home use only DME Air overlay mattress  Once        10/08/20 [id number]            Follow-up Information     Suzan Slick, NP Follow up in 1 week(s).   Specialty: Orthopedic Surgery Contact information: [place] [address] [phone]                No Known Allergies  Consultations: Nephrology   Procedures/Studies: DG Chest 1 View  Result Date: 10/07/2020 CLINICAL DATA:  Congestive heart failure. Shortness of breath. Chest pain. having surgery for a lower leg amputation tomorrow due to infections in his foot. Hx of htn. EXAM: CHEST  1 VIEW COMPARISON:  Chest x-ray 09/23/2020, CT chest 11/26/2018 FINDINGS: Enlarged cardiac silhouette. The heart size and mediastinal contours are unchanged. Bibasilar atelectasis. No focal consolidation. No  pulmonary edema. No pleural effusion. No pneumothorax. No acute osseous abnormality. IMPRESSION: Bibasilar atelectasis with superimposed infection/inflammation not excluded. Electronically Signed   By: Iven Finn M.D.   On: 10/07/2020 18:48   DG Chest 2 View  Result Date: 09/23/2020 CLINICAL DATA:  Shortness of breath EXAM: CHEST - 2 VIEW COMPARISON:  07/21/2020, CT 11/26/2018 FINDINGS: Cardiomegaly with vascular congestion and mild interstitial edema. Small right-sided pleural effusion with airspace disease at the right base. No pneumothorax. IMPRESSION: 1. Cardiomegaly with vascular congestion, mild pulmonary edema, and small right-sided pleural effusion 2. Airspace disease at the right base which may be due to atelectasis or pneumonia Electronically Signed   By: Donavan Foil M.D.   On: 09/23/2020 16:57   MR HEEL RIGHT WO CONTRAST  Result Date: 09/26/2020 CLINICAL DATA:  Osteomyelitis, foot EXAM: MR OF THE RIGHT HEEL WITHOUT CONTRAST TECHNIQUE: Multiplanar, multisequence  MR imaging of the right heel was performed. No intravenous contrast was administered. COMPARISON:  Foot radiographs 09/24/2020 FINDINGS: Bones/Joint/Cartilage There is increased STIR signal and confluent low T1 signal along the plantar aspect of the calcaneus medially and laterally. There is disruption of the cortex. There is no significant joint effusion. There is no evidence of septic arthritis. Ligaments Chronic ATFL sprain. Intact anterior and posterior tibiofibular ligaments. Calcaneofibular ligament is intact. The deltoid ligament is intact. The spring ligament is intact. Muscles and Tendons Mild muscle atrophy. The proximal lateral plantar fascia attachment is not well visualized and likely torn. The medial bundle appears intact but with increased signal at the attachment on the calcaneus. Soft tissues There is a plantar soft tissue wound with extensive soft tissue swelling and small foci of susceptibility artifact. IMPRESSION:  Osteomyelitis of the plantar aspect of the calcaneus with overlying plantar soft tissue wound. Inflammatory change in the subcutaneous tissues with possible foreign body or soft tissue emphysema. No drainable fluid collection. Torn lateral bundle plantar fascia from the proximal attachment. Thickening of the medial bundle plantar fascia with mildly increased signal at the calcaneal attachment, which could be reactive, degenerative change, or represent involvement by the infectious process. Electronically Signed   By: Maurine Simmering   On: 09/26/2020 11:44   MR HEEL LEFT WO CONTRAST  Result Date: 09/26/2020 CLINICAL DATA:  Chronic left heel ulceration.  Diabetes. EXAM: MR OF THE LEFT HEEL WITHOUT CONTRAST TECHNIQUE: Multiplanar, multisequence MR imaging of the left heel was performed. No intravenous contrast was administered. COMPARISON:  X-ray 09/24/2020 FINDINGS: Large deep soft tissue ulceration at the posterior and plantar aspect of the left heel with ulcer base closely approximating the cortex of the posterior calcaneus. Extensive bone marrow edema throughout the posterior and plantar aspects of the calcaneal body (series 7, image 12). Extensive confluent low T1 marrow signal throughout the plantar aspect of the calcaneus is compatible with acute osteomyelitis (series 6, images 5-16). Marrow signal abnormality abuts the calcaneal sulcus. There is a trace posterior subtalar joint effusion, nonspecific. Preserved marrow signal of the talus, cuboid, and remaining visualized midfoot. Distal tibia and fibula are within normal limits. Trace tibiotalar joint effusion, nonspecific. Distal Achilles tendon is intact inserting onto the posterior calcaneus. Remaining tendinous structures about the ankle are grossly intact. No tenosynovitis. Diffuse intramuscular edema of the foot musculature likely reflecting denervation and myositis. Plantar soft tissue swelling. No organized fluid collection. IMPRESSION: 1. Large deep soft  tissue ulceration at the posterior and plantar aspect of the left heel with ulcer base closely approximating the cortex of the posterior calcaneus. Extensive acute osteomyelitis of the calcaneus. 2. Trace posterior subtalar joint effusion is nonspecific. Septic arthritis not excluded. 3. Diffuse intramuscular edema of the foot musculature likely reflecting denervation and myositis. Electronically Signed   By: Davina Poke D.O.   On: 09/26/2020 11:33   DG Foot Complete Left  Result Date: 09/24/2020 CLINICAL DATA:  Plantar heel ulcer.  Osteomyelitis. EXAM: LEFT FOOT - COMPLETE 3+ VIEW COMPARISON:  Foot radiograph 08/11/2020 FINDINGS: Soft tissue defect overlies the plantar aspect of the calcaneus. No convincing cortical irregularity or destruction to suggest osteomyelitis by radiograph. No radiopaque foreign body. Hammertoe deformity of the digits. Limited assessment of the metatarsal phalangeal joints due to positioning. Generalized osteopenia. Soft tissue edema over the dorsum of the foot has improved from prior exam. Prominent vascular calcifications. IMPRESSION: 1. Soft tissue defect overlies the plantar aspect of the calcaneus. No radiographic findings of the subjacent calcaneus  to suggest osteomyelitis. 2. Osteopenia/osteoporosis. 3. Vascular calcifications. Electronically Signed   By: Keith Rake M.D.   On: 09/24/2020 21:03   DG Foot Complete Right  Result Date: 10/07/2020 CLINICAL DATA:  Wound on heel for 6 months, gangrene EXAM: RIGHT FOOT COMPLETE - 3+ VIEW COMPARISON:  09/24/2020 FINDINGS: Frontal, oblique, and lateral views of the right foot are obtained. There is a large soft tissue defect overlying the plantar aspect of the right calcaneus with underlying bony changes consistent with acute osteomyelitis. Interval development of extensive subcutaneous gas throughout the plantar soft tissues which could reflect interval debridement or infection with gas-forming organism. Progressive soft  tissue swelling of the remainder of the right foot. There is extensive periarticular osteopenia and diffuse osteoarthritis. Extensive vascular calcifications are noted. IMPRESSION: 1. Continued plantar soft tissue defect with underlying calcaneal osteomyelitis. Interval development of extensive subcutaneous gas could reflect interval debridement or infection with gas-forming organism. 2. Progressive soft tissue swelling of the right forefoot and midfoot. 3. Diffuse osteoarthritis and osteopenia. Electronically Signed   By: Randa Ngo M.D.   On: 10/07/2020 15:43   DG Foot Complete Right  Result Date: 09/24/2020 CLINICAL DATA:  Osteomyelitis.  Large plantar heel ulcer. EXAM: RIGHT FOOT COMPLETE - 3+ VIEW COMPARISON:  Radiograph 08/11/2020. FINDINGS: Technically limited due to overlying artifact from socks. Plantar soft tissue defect overlies the calcaneus. Decreased bone density of the subjacent calcaneus suspicious for osteomyelitis. Hammertoe deformity of the digits. No acute fracture. Decreased soft tissue edema over the dorsum of the foot. IMPRESSION: 1. Plantar soft tissue defect overlies the calcaneus. Decreased bone density of the subjacent calcaneus suspicious for osteomyelitis. 2. Technically limited evaluation due to overlying artifact from socks. Electronically Signed   By: Keith Rake M.D.   On: 09/24/2020 21:01   ECHOCARDIOGRAM COMPLETE  Result Date: 09/24/2020    ECHOCARDIOGRAM REPORT   Patient Name:   [name] [name] Date of Exam: 09/24/2020 Medical Rec #:  [id number]      Height:       65.0 in Accession #:    [id number]     Weight:       224.9 lb Date of Birth:  December 25, 1965     BSA:          2.079 m Patient Age:    49 years       BP:           134/82 mmHg Patient Gender: M              HR:           102 bpm. Exam Location:  Inpatient Procedure: 2D Echo, Cardiac Doppler and Color Doppler Indications:    CHF-Acute Systolic [id number]  History:        Patient has prior history of Echocardiogram  examinations, most                 recent 04/24/2019. Cardiomyopathy and CHF; Risk                 Factors:Hypertension and Diabetes.  Sonographer:    Vickie Epley RDCS Referring Phys: [id number] [place]  1. Left ventricular ejection fraction, by estimation, is 25 to 30%. The left ventricle has severely decreased function. The left ventricle demonstrates global hypokinesis. The left ventricular internal cavity size was moderately dilated. Indeterminate diastolic filling due to E-A fusion.  2. Right ventricular systolic function is normal. The right ventricular size is not well visualized. There is moderately elevated  pulmonary artery systolic pressure.  3. Left atrial size was mildly dilated.  4. Right atrial size was mildly dilated.  5. The mitral valve is abnormal. Mild to moderate mitral valve regurgitation. No evidence of mitral stenosis.  6. Tricuspid valve regurgitation is moderate.  7. The aortic valve is tricuspid. There is mild calcification of the aortic valve. There is mild thickening of the aortic valve. Aortic valve regurgitation is not visualized. Mild aortic valve sclerosis is present, with no evidence of aortic valve stenosis.  8. The inferior vena cava is dilated in size with <50% respiratory variability, suggesting right atrial pressure of 15 mmHg. Comparison(s): Prior images reviewed side by side. Changes from prior study are noted. Personal review of prior echo would put EF ~35-40%, but it has dropped further since the stidy of 04/24/19. Conclusion(s)/Recommendation(s): Since prior study, EF appears reduced with global hypokinesis. FINDINGS  Left Ventricle: Left ventricular ejection fraction, by estimation, is 25 to 30%. The left ventricle has severely decreased function. The left ventricle demonstrates global hypokinesis. The left ventricular internal cavity size was moderately dilated. There is no left ventricular hypertrophy. Indeterminate diastolic filling due to E-A fusion.  Right Ventricle: The right ventricular size is not well visualized. Right vetricular wall thickness was not well visualized. Right ventricular systolic function is normal. There is moderately elevated pulmonary artery systolic pressure. The tricuspid regurgitant velocity is 3.19 m/s, and with an assumed right atrial pressure of 15 mmHg, the estimated right ventricular systolic pressure is [id number] mmHg. Left Atrium: Left atrial size was mildly dilated. Right Atrium: Right atrial size was mildly dilated. Pericardium: There is no evidence of pericardial effusion. Mitral Valve: The mitral valve is abnormal. There is mild thickening of the mitral valve leaflet(s). There is mild calcification of the mitral valve leaflet(s). Mild to moderate mitral valve regurgitation. No evidence of mitral valve stenosis. Tricuspid Valve: The tricuspid valve is normal in structure. Tricuspid valve regurgitation is moderate. Aortic Valve: The aortic valve is tricuspid. There is mild calcification of the aortic valve. There is mild thickening of the aortic valve. Aortic valve regurgitation is not visualized. Mild aortic valve sclerosis is present, with no evidence of aortic valve stenosis. Pulmonic Valve: The pulmonic valve was grossly normal. Pulmonic valve regurgitation is mild to moderate. No evidence of pulmonic stenosis. Aorta: The aortic root, ascending aorta, aortic arch and descending aorta are all structurally normal, with no evidence of dilitation or obstruction. Venous: The inferior vena cava is dilated in size with less than 50% respiratory variability, suggesting right atrial pressure of 15 mmHg. IAS/Shunts: The atrial septum is grossly normal.  LEFT VENTRICLE PLAX 2D LVIDd:         5.50 cm LVIDs:         4.60 cm LV PW:         0.70 cm LV IVS:        0.70 cm LVOT diam:     2.00 cm LV SV:         42 LV SV Index:   20 LVOT Area:     3.14 cm  LV Volumes (MOD) LV vol d, MOD A2C: 207.0 ml LV vol d, MOD A4C: 203.0 ml LV vol s, MOD A2C:  153.0 ml LV vol s, MOD A4C: 131.0 ml LV SV MOD A2C:     54.0 ml LV SV MOD A4C:     203.0 ml LV SV MOD BP:      64.1 ml RIGHT VENTRICLE RV S prime:  10.40 cm/s TAPSE (M-mode): 1.2 cm LEFT ATRIUM             Index       RIGHT ATRIUM           Index LA diam:        4.90 cm 2.36 cm/m  RA Area:     17.50 cm LA Vol (A2C):   53.9 ml 25.92 ml/m RA Volume:   45.80 ml  22.03 ml/m LA Vol (A4C):   47.2 ml 22.70 ml/m LA Biplane Vol: 52.0 ml 25.01 ml/m  AORTIC VALVE LVOT Vmax:   91.60 cm/s LVOT Vmean:  54.800 cm/s LVOT VTI:    0.133 m  AORTA Ao Root diam: 2.80 cm TRICUSPID VALVE TR Peak grad:   40.7 mmHg TR Vmax:        319.00 cm/s  SHUNTS Systemic VTI:  0.13 m Systemic Diam: 2.00 cm Buford Dresser MD Electronically signed by Buford Dresser MD Signature Date/Time: 09/24/2020/5:27:22 PM    Final       Subjective: Patient seen and examined at bedside this morning. Hemodynamically stable for dc as soon as bed is available  Discharge Exam: Vitals:   10/16/20 0928 10/16/20 0957  BP: 135/72 140/72  Pulse: 96 98  Resp:    Temp:    SpO2:     Vitals:   10/16/20 0501 10/16/20 0830 10/16/20 0928 10/16/20 0957  BP: 108/66 118/61 135/72 140/72  Pulse: 98 94 96 98  Resp: 18 16    Temp: 98 F (36.7 C) 98.9 F (37.2 C)    TempSrc: Oral Oral    SpO2: 100%     Weight:        General: Pt is alert, awake, not in acute distress,obese Cardiovascular: RRR, S1/S2 +, no rubs, no gallops Respiratory: CTA bilaterally, no wheezing, no rhonchi Abdominal: Soft, NT, ND, bowel sounds + Extremities: B/L BKA,wound vac    The results of significant diagnostics from this hospitalization (including imaging, microbiology, ancillary and laboratory) are listed below for reference.     Microbiology: Recent Results (from the past 240 hour(s))  Culture, blood (Routine x 2)     Status: None   Collection Time: 10/07/20  1:06 PM   Specimen: BLOOD  Result Value Ref Range Status   Specimen Description BLOOD  LEFT ANTECUBITAL  Final   Special Requests   Final    BOTTLES DRAWN AEROBIC AND ANAEROBIC Blood Culture results may not be optimal due to an inadequate volume of blood received in culture bottles   Culture   Final    NO GROWTH 5 DAYS Performed at [facility], [place] [address], [place], [place] [address]    Report Status 10/12/2020 FINAL  Final  SARS CORONAVIRUS 2 (TAT 6-24 HRS) Nasopharyngeal Nasopharyngeal Swab     Status: None   Collection Time: 10/07/20  4:39 PM   Specimen: Nasopharyngeal Swab  Result Value Ref Range Status   SARS Coronavirus 2 NEGATIVE NEGATIVE Final    Comment: (NOTE) SARS-CoV-2 target nucleic acids are NOT DETECTED.  The SARS-CoV-2 RNA is generally detectable in upper and lower respiratory specimens during the acute phase of infection. Negative results do not preclude SARS-CoV-2 infection, do not rule out co-infections with other pathogens, and should not be used as the sole basis for treatment or other patient management decisions. Negative results must be combined with clinical observations, patient history, and epidemiological information. The expected result is Negative.  Fact Sheet for Patients: SugarRoll.be  Fact Sheet  for Healthcare Providers: https://www.woods-mathews.com/  This test is not yet approved or cleared by the [place] and  has been authorized for detection and/or diagnosis of SARS-CoV-2 by FDA under an Emergency Use Authorization (EUA). This EUA will remain  in effect (meaning this test can be used) for the duration of the COVID-19 declaration under Se ction 564(b)(1) of the Act, 21 U.S.C. section 360bbb-3(b)(1), unless the authorization is terminated or revoked sooner.  Performed at [facility], [place] [address] [place], [place] [address]   Culture, blood (Routine x 2)     Status: None   Collection Time: 10/07/20  8:07 PM   Specimen: BLOOD LEFT HAND  Result Value Ref  Range Status   Specimen Description BLOOD LEFT HAND  Final   Special Requests   Final    BOTTLES DRAWN AEROBIC AND ANAEROBIC Blood Culture adequate volume   Culture   Final    NO GROWTH 5 DAYS Performed at [facility], [place] [address] [place], [place] [address]    Report Status 10/12/2020 FINAL  Final  Culture, blood (single)     Status: None   Collection Time: 10/08/20  4:04 AM   Specimen: BLOOD  Result Value Ref Range Status   Specimen Description BLOOD SITE NOT SPECIFIED  Final   Special Requests   Final    BOTTLES DRAWN AEROBIC AND ANAEROBIC Blood Culture adequate volume   Culture   Final    NO GROWTH 5 DAYS Performed at [facility], [place] [address], [place], [place] [address]    Report Status 10/13/2020 FINAL  Final  Surgical pcr screen     Status: None   Collection Time: 10/10/20  9:12 AM   Specimen: Nasal Mucosa; Nasal Swab  Result Value Ref Range Status   MRSA, PCR NEGATIVE NEGATIVE Final   Staphylococcus aureus NEGATIVE NEGATIVE Final    Comment: (NOTE) The Xpert SA Assay (FDA approved for NASAL specimens in patients 69 years of age and older), is one component of a comprehensive surveillance program. It is not intended to diagnose infection nor to guide or monitor treatment. Performed at [facility], [place] [address], [place],  [address]      Labs: BNP (last 3 results) Recent Labs    07/21/20 1234 09/23/20 2015  BNP 4,425.6* [id number]*   Basic Metabolic Panel: Recent Labs  Lab 10/10/20 0122 10/11/20 0140 10/12/20 0330 10/13/20 0430 10/14/20 0647  NA 134* 135 134* 129* 134*  K 4.1 4.1 4.0 4.2 4.2  CL 96* 96* 96* 91* 93*  CO2 '23 22 23 23 27  '$ GLUCOSE 83 113* 186* 123* 99  BUN 36* 23* 42* 60* 34*  CREATININE 5.78* 4.26* 5.23* 5.96* 4.45*  CALCIUM 8.7* 8.6* 8.1* 7.7* 7.7*  PHOS 5.7* 5.5*  --  5.6* 4.2   Liver Function Tests: Recent Labs  Lab 10/10/20 0122 10/11/20 0140 10/13/20 0430 10/14/20 0647  ALBUMIN 2.6* 2.3*  2.1* 2.1*   No results for input(s): LIPASE, AMYLASE in the last 168 hours. Recent Labs  Lab 10/09/20 1607  AMMONIA 38*   CBC: Recent Labs  Lab 10/10/20 0122 10/11/20 0140 10/12/20 0330 10/13/20 0430 10/14/20 0647  WBC 10.8* 14.0* 13.8* 10.4 9.0  HGB 8.8* 11.6* 10.8* 10.0* 11.1*  HCT 29.9* 38.2* 36.1* 34.0* 36.8*  MCV 98.0 95.5 96.0 95.8 95.1  PLT 388 385 433* 437* 422*   Cardiac Enzymes: No results for input(s): CKTOTAL, CKMB, CKMBINDEX, TROPONINI in the last  168 hours. BNP: Invalid input(s): POCBNP CBG: Recent Labs  Lab 10/10/20 1546 10/11/20 0406 10/12/20 2122  GLUCAP 97 117* 126*   D-Dimer No results for input(s): DDIMER in the last 72 hours. Hgb A1c No results for input(s): HGBA1C in the last 72 hours. Lipid Profile No results for input(s): CHOL, HDL, LDLCALC, TRIG, CHOLHDL, LDLDIRECT in the last 72 hours. Thyroid function studies No results for input(s): TSH, T4TOTAL, T3FREE, THYROIDAB in the last 72 hours.  Invalid input(s): FREET3 Anemia work up No results for input(s): VITAMINB12, FOLATE, FERRITIN, TIBC, IRON, RETICCTPCT in the last 72 hours. Urinalysis No results found for: COLORURINE, APPEARANCEUR, [place], [place], [place], [place], [place], [place], PROTEINUR, UROBILINOGEN, NITRITE, LEUKOCYTESUR Sepsis Labs Invalid input(s): PROCALCITONIN,  WBC,  LACTICIDVEN Microbiology Recent Results (from the past 240 hour(s))  Culture, blood (Routine x 2)     Status: None   Collection Time: 10/07/20  1:06 PM   Specimen: BLOOD  Result Value Ref Range Status   Specimen Description BLOOD LEFT ANTECUBITAL  Final   Special Requests   Final    BOTTLES DRAWN AEROBIC AND ANAEROBIC Blood Culture results may not be optimal due to an inadequate volume of blood received in culture bottles   Culture   Final    NO GROWTH 5 DAYS Performed at [facility], [place] [address], [place], [place] [address]    Report Status 10/12/2020 FINAL  Final  SARS CORONAVIRUS 2  (TAT 6-24 HRS) Nasopharyngeal Nasopharyngeal Swab     Status: None   Collection Time: 10/07/20  4:39 PM   Specimen: Nasopharyngeal Swab  Result Value Ref Range Status   SARS Coronavirus 2 NEGATIVE NEGATIVE Final    Comment: (NOTE) SARS-CoV-2 target nucleic acids are NOT DETECTED.  The SARS-CoV-2 RNA is generally detectable in upper and lower respiratory specimens during the acute phase of infection. Negative results do not preclude SARS-CoV-2 infection, do not rule out co-infections with other pathogens, and should not be used as the sole basis for treatment or other patient management decisions. Negative results must be combined with clinical observations, patient history, and epidemiological information. The expected result is Negative.  Fact Sheet for Patients: SugarRoll.be  Fact Sheet for Healthcare Providers: https://www.woods-mathews.com/  This test is not yet approved or cleared by the [place] FDA and  has been authorized for detection and/or diagnosis of SARS-CoV-2 by FDA under an Emergency Use Authorization (EUA). This EUA will remain  in effect (meaning this test can be used) for the duration of the COVID-19 declaration under Se ction 564(b)(1) of the Act, 21 U.S.C. section 360bbb-3(b)(1), unless the authorization is terminated or revoked sooner.  Performed at [facility], [place] [address] [place], [address] [address]   Culture, blood (Routine x 2)     Status: None   Collection Time: 10/07/20  8:07 PM   Specimen: BLOOD LEFT HAND  Result Value Ref Range Status   Specimen Description BLOOD LEFT HAND  Final   Special Requests   Final    BOTTLES DRAWN AEROBIC AND ANAEROBIC Blood Culture adequate volume   Culture   Final    NO GROWTH 5 DAYS Performed at [facility], [place] [address], [place],  [address]    Report Status 10/12/2020 FINAL  Final  Culture, blood (single)     Status: None   Collection Time:  10/08/20  4:04 AM   Specimen: BLOOD  Result Value Ref Range Status   Specimen Description BLOOD SITE NOT SPECIFIED  Final   Special Requests   Final    BOTTLES DRAWN AEROBIC AND ANAEROBIC Blood Culture adequate volume   Culture   Final    NO GROWTH 5 DAYS Performed at [facility], [address], [place], [place] [address]    Report Status 10/13/2020 FINAL  Final  Surgical pcr screen     Status: None   Collection Time: 10/10/20  9:12 AM   Specimen: Nasal Mucosa; Nasal Swab  Result Value Ref Range Status   MRSA, PCR NEGATIVE NEGATIVE Final   Staphylococcus aureus NEGATIVE NEGATIVE Final    Comment: (NOTE) The Xpert SA Assay (FDA approved for NASAL specimens in patients 63 years of age and older), is one component of a comprehensive surveillance program. It is not intended to diagnose infection nor to guide or monitor treatment. Performed at [facility], [place] [address], [place], [place] [address]     Please note: You were cared for by a hospitalist during your hospital stay. Once you are discharged, your primary care physician will handle any further medical issues. Please note that NO REFILLS for any discharge medications will be authorized once you are discharged, as it is imperative that you return to your primary care physician (or establish a relationship with a primary care physician if you do not have one) for your post hospital discharge needs so that they can reassess your need for medications and monitor your lab values.    Time coordinating discharge: 40 minutes  SIGNED:   Shelly Coss, MD  [facility] 10/16/2020, 10:25 AM Pager [phone]  If 7PM-7AM, please contact night-coverage [email or web address] Password [email or web address]

## 2020-10-15 NOTE — Progress Notes (Signed)
Copy of Covid vaccine card has been placed in the patient chart.

## 2020-10-15 NOTE — Plan of Care (Signed)
  Problem: Health Behavior/Discharge Planning: Goal: Ability to manage health-related needs will improve Outcome: Progressing   

## 2020-10-15 NOTE — TOC Progression Note (Signed)
Transition of Care [facility]) - Progression Note    Patient Details  Name: [name] [name] MRN: [id number] Date of Birth: 12/13/1965  Transition of Care [facility]) CM/SW [place], [place] Phone Number: 10/15/2020, 4:43 PM  Clinical Narrative:    4:20pm:Pt's mother contacted CSW to informed that pt would like to go to [facility].  Pt previous wanted Home Health.  CSW will contact [facility] to see if bed is available.  TOC will continue to assist with disposition planning.   Expected Discharge Plan: Skilled Nursing Facility Barriers to Discharge: Ship broker, Continued Medical Work up, SNF Pending bed offer  Expected Discharge Plan and Services Expected Discharge Plan: [place] In-house Referral: Clinical Social Work     Living arrangements for the past 2 months: Single Family Home Expected Discharge Date: 10/15/20                                     Social Determinants of Health (SDOH) Interventions    Readmission Risk Interventions No flowsheet data found.

## 2020-10-15 NOTE — TOC Progression Note (Signed)
Transition of Care [facility]) - Progression Note    Patient Details  Name: [name] [name] MRN: [id number] Date of Birth: January 03, 1966  Transition of Care [facility]) CM/SW [place], [place] Phone Number: 10/15/2020, 2:24 PM  Clinical Narrative:    CSW spoke with pt's mother concerning SNF.  Pt and pt's mother have decided to go home with home health instead of SNF.  CSW updated physician and RNCM Manuela Schwartz.    Expected Discharge Plan: Skilled Nursing Facility Barriers to Discharge: Ship broker, Continued Medical Work up, SNF Pending bed offer  Expected Discharge Plan and Services Expected Discharge Plan: [place] In-house Referral: Clinical Social Work     Living arrangements for the past 2 months: Single Family Home Expected Discharge Date: 10/15/20                                     Social Determinants of Health (SDOH) Interventions    Readmission Risk Interventions No flowsheet data found.

## 2020-10-16 MED ORDER — HEPARIN SODIUM (PORCINE) 1000 UNIT/ML DIALYSIS: 3000.0000 [IU] | INTRAMUSCULAR | Status: DC | PRN

## 2020-10-16 MED ORDER — ONDANSETRON HCL 4 MG/2ML IJ SOLN
INTRAMUSCULAR | Status: AC
  Administered 2020-10-16: 4 mg via INTRAVENOUS
  Filled 2020-10-16: qty 2

## 2020-10-16 MED ORDER — OXYCODONE-ACETAMINOPHEN 5-325 MG PO TABS
ORAL_TABLET | ORAL | Status: AC
  Filled 2020-10-16: qty 1

## 2020-10-16 MED ORDER — DARBEPOETIN ALFA 150 MCG/0.3ML IJ SOSY
PREFILLED_SYRINGE | INTRAMUSCULAR | Status: AC
  Filled 2020-10-16: qty 0.3

## 2020-10-16 NOTE — Consult Note (Signed)
   [facility] Inpatient Consult   10/16/2020  [name] [name] 1965/11/19 [id number]  [place] [facility] [ACO] Patient: Medicare CMS DCE  Primary Care Provider:  [name], MD   Patient screened for length of stay  hospitalization with less than 30 day readmission.  Patient noted extreme high risk score for unplanned readmission risk and  to assess for potential [facility] service needs for post hospital transition.  Review of patient's medical record reveals patient is currently for a skilled nursing level of care for transition verse home health.  Plan:  Continue to follow progress and disposition to assess for post hospital care management needs.  If patient goes to a [facility] affiliated SNF will alert [facility] RN of transitional needs.  For questions contact:   Natividad Brood, RN BSN [facility] Liaison  [phone] business mobile phone Toll free office [phone]  Fax number: [phone] [email or web address]

## 2020-10-16 NOTE — Progress Notes (Signed)
PT Cancellation Note  Patient Details Name: [name] [name] MRN: [id number] DOB: [birth year]   Cancelled Treatment:    Reason Eval/Treat Not Completed: (P) Patient at procedure or test/unavailable Pt is off floor for HD. PT will follow back for treatment as able.  [name] B. Migdalia Dk PT, DPT Acute Rehabilitation Services Pager [phone] Office [phone]    [place] 10/16/2020, 10:25 AM

## 2020-10-16 NOTE — Progress Notes (Signed)
Patient seen and examined the bedside at dialysis site.  He is comfortable, denies any new complaints.  He is medically stable for discharge.  Discharge is delayed because he has not resumed the facility yet.  There is no new change in the medical management.  I talked to his mom  at the bedside this morning. Discharge summary and orders are already in.

## 2020-10-16 NOTE — Progress Notes (Signed)
[facility] Progress Note   Subjective:  Seen on HD, 4L UF yest on HD.   Objective Vitals:   10/16/20 1029 10/16/20 1100 10/16/20 1130 10/16/20 1219  BP: (!) 155/77 (!) 147/68 (!) 112/59 109/76  Pulse: 97 (!) 102 99 98  Resp:    18  Temp:    98.5 F (36.9 C)  TempSrc:    Oral  SpO2:    100%  Weight:       Physical Exam General: Chronically ill appearing man, NAD. Room air. Seen in HD Heart: RRR; no murmur Lungs: CTA anteriorly Abdomen: soft, non-tender Extremities: 1-2+ bilat hip edema, improving, bilat BKA Dialysis Access: R AVF + bruit  Additional Objective Labs: Basic Metabolic Panel: Recent Labs  Lab 10/11/20 0140 10/12/20 0330 10/13/20 0430 10/14/20 0647  NA 135 134* 129* 134*  K 4.1 4.0 4.2 4.2  CL 96* 96* 91* 93*  CO2 '22 23 23 27  '$ GLUCOSE 113* 186* 123* 99  BUN 23* 42* 60* 34*  CREATININE 4.26* 5.23* 5.96* 4.45*  CALCIUM 8.6* 8.1* 7.7* 7.7*  PHOS 5.5*  --  5.6* 4.2    Liver Function Tests: Recent Labs  Lab 10/11/20 0140 10/13/20 0430 10/14/20 0647  ALBUMIN 2.3* 2.1* 2.1*    CBC: Recent Labs  Lab 10/10/20 0122 10/11/20 0140 10/12/20 0330 10/13/20 0430 10/14/20 0647  WBC 10.8* 14.0* 13.8* 10.4 9.0  HGB 8.8* 11.6* 10.8* 10.0* 11.1*  HCT 29.9* 38.2* 36.1* 34.0* 36.8*  MCV 98.0 95.5 96.0 95.8 95.1  PLT 388 385 433* 437* 422*    Iron Studies:  No results for input(s): IRON, TIBC, TRANSFERRIN, FERRITIN in the last 72 hours.  Medications:    (feeding supplement) PROSource Plus  30 mL Oral BID BM   vitamin C  1,000 mg Oral Daily   aspirin  325 mg Oral Daily   atorvastatin  10 mg Oral QPM   calcium acetate  1,334 mg Oral TID WC   Chlorhexidine Gluconate Cloth  6 each Topical Q0600   cinacalcet  30 mg Oral Q breakfast   collagenase   Topical Daily   cycloSPORINE  1 drop Both Eyes Daily   heparin  5,000 Units Subcutaneous Q8H   insulin aspart  0-6 Units Subcutaneous Q4H   insulin glargine  5 Units Subcutaneous Daily    lidocaine  1 patch Transdermal Q24H   midodrine  5 mg Oral Q T,Th,Sat-1800   nutrition supplement (JUVEN)  1 packet Oral BID BM   oxyCODONE-acetaminophen       pantoprazole  40 mg Oral Daily   sucroferric oxyhydroxide  500 mg Oral Q breakfast   zinc sulfate  220 mg Oral Daily    Dialysis Orders: [facility] --> need to clarify  3h 17mn  450/1.5  90kg  2/2.5 bath AVF  Hep 6000 +500u/hr - Aranesp 1055m IV q Thur - Zemplar 47m347mIV q HD   Assessment/Plan: B heel wounds/osteomyelitis: SP B BKA Dr. DudSharol Given 7/22. Volume: has significant hip edema, after bilat BKA dry wt should be below 85kg  ESRD: Usual TTS sched. HD today   Hypertension: No antihypertensive meds - on mido '5mg'$  pre  Anemia: Hgb 8.8 - sp Aranesp here, dc'd due to Hb 11 now. SP prbc's  Metabolic bone disease: Ca ok, Phos ok. Continue binders (Phoslo), sensipar.  Nutrition: Alb very low - continue supplements.  Sacral wound: Noted during last admit. Per primary.  Hx finger amputations, ischemic  HFrEF (  25-30%): Perhaps will improve once volume corrected  Myoclonic jerking: lowered dose then dc'd altogether. Jerking resolved.  Dispo - plan is for SNF placement , pending   Kelly Splinter, MD 10/16/2020, 1:28 PM

## 2020-10-16 NOTE — TOC Progression Note (Signed)
Transition of Care [facility]) - Progression Note    Patient Details  Name: LITTLETON IBARA MRN: [id number] Date of Birth: Aug 12, 1965  [facility]) CM/SW Contact  Sharlet Salina Mila Homer, LCSW Phone Number: 10/16/2020, 4:58 PM  Clinical Narrative:  Talked with patient and his mother at the bedside (2:01 pm) and later (4:53 pm) talked at the bedside with patient and his mother by phone. Mr. Poppen and his mother informed that other SNF's contacted and still no other bed offers, and [facility] remains the only facility that is accepting patient. Mr. Carleton advised that follow-up will be made with [facility], as they were called and messages left and no response as of yet.  CSW will continue to work on SNF placement for patient.   Expected Discharge Plan: Skilled Nursing Facility Barriers to Discharge: Ship broker, Continued Medical Work up, SNF Pending bed offer  Expected Discharge Plan and Services Expected Discharge Plan: [place] In-house Referral: Clinical Social Work     Living arrangements for the past 2 months: Single Family Home Expected Discharge Date: 10/15/20                                   Social Determinants of Health (SDOH) Interventions  No SDOH interventions requested or needed at this time.  Readmission Risk Interventions No flowsheet data found.

## 2020-10-17 NOTE — Progress Notes (Signed)
Patient seen and examined at bedside this morning.  Hemodynamically stable.  Mother was at the bedside.  Denies any complaints today.  Pending placement in the skilled nursing facility.  Medically stable for discharge whenever possible. TOC aware. No change in the medical management.  Discharge orders and summary are already in. As per Dr. Sharol Given, he is planning to remove the wound VACs.

## 2020-10-17 NOTE — Progress Notes (Signed)
[facility] Progress Note   Subjective:  Seen in room, no c/o today. PT recommends SNF.   Objective Vitals:   10/16/20 1827 10/16/20 2028 10/17/20 0427 10/17/20 0934  BP: 120/68 132/78 135/80 122/74  Pulse: 96 97 100 (!) 101  Resp: '16 20 19 17  '$ Temp: 98.3 F (36.8 C)  98 F (36.7 C) 98.4 F (36.9 C)  TempSrc: Oral  Oral Oral  SpO2: 97% 98% 99% 96%  Weight:       Physical Exam General: Chronically ill appearing man, NAD. Room air. Seen in HD Heart: RRR; no murmur Lungs: CTA anteriorly Abdomen: soft, non-tender Extremities: 1-2+ bilat hip edema, improving, bilat BKA Dialysis Access: R AVF + bruit  Additional Objective Labs: Basic Metabolic Panel: Recent Labs  Lab 10/11/20 0140 10/12/20 0330 10/13/20 0430 10/14/20 0647  NA 135 134* 129* 134*  K 4.1 4.0 4.2 4.2  CL 96* 96* 91* 93*  CO2 '22 23 23 27  '$ GLUCOSE 113* 186* 123* 99  BUN 23* 42* 60* 34*  CREATININE 4.26* 5.23* 5.96* 4.45*  CALCIUM 8.6* 8.1* 7.7* 7.7*  PHOS 5.5*  --  5.6* 4.2    Liver Function Tests: Recent Labs  Lab 10/11/20 0140 10/13/20 0430 10/14/20 0647  ALBUMIN 2.3* 2.1* 2.1*    CBC: Recent Labs  Lab 10/11/20 0140 10/12/20 0330 10/13/20 0430 10/14/20 0647  WBC 14.0* 13.8* 10.4 9.0  HGB 11.6* 10.8* 10.0* 11.1*  HCT 38.2* 36.1* 34.0* 36.8*  MCV 95.5 96.0 95.8 95.1  PLT 385 433* 437* 422*    Iron Studies:  No results for input(s): IRON, TIBC, TRANSFERRIN, FERRITIN in the last 72 hours.  Medications:    (feeding supplement) PROSource Plus  30 mL Oral BID BM   vitamin C  1,000 mg Oral Daily   aspirin  325 mg Oral Daily   atorvastatin  10 mg Oral QPM   calcium acetate  1,334 mg Oral TID WC   Chlorhexidine Gluconate Cloth  6 each Topical Q0600   cinacalcet  30 mg Oral Q breakfast   collagenase   Topical Daily   cycloSPORINE  1 drop Both Eyes Daily   heparin  5,000 Units Subcutaneous Q8H   insulin aspart  0-6 Units Subcutaneous Q4H   insulin glargine  5 Units  Subcutaneous Daily   lidocaine  1 patch Transdermal Q24H   midodrine  5 mg Oral Q T,Th,Sat-1800   nutrition supplement (JUVEN)  1 packet Oral BID BM   pantoprazole  40 mg Oral Daily   sucroferric oxyhydroxide  500 mg Oral Q breakfast   zinc sulfate  220 mg Oral Daily    Dialysis Orders: [facility] --> need to clarify  3h 65mn  450/1.5  90kg  2/2.5 bath AVF  Hep 6000 +500u/hr - Aranesp 102m IV q Thur - Zemplar 76m83mIV q HD   Assessment/Plan: B heel wounds/osteomyelitis: SP B BKA Dr. DudSharol Given 7/22. Volume:bilat pitting hip edema 3+ has improved to 1-2+, unable to get good weights w/ air bed. New dry wt should be around 84- 85kg. Tolerating 3-4 L UF every HD so far here.   ESRD: Usual TTS sched. HD Sat. Awaiting SNF placement.   Hypertension: No antihypertensive meds - on mido '5mg'$  pre  Anemia: Hgb 8.8 - sp Aranesp here, dc'd due to Hb 11 now. SP prbc's  Metabolic bone disease: Ca ok, Phos ok. Continue binders (Phoslo), sensipar.  Nutrition: Alb very low - continue supplements.  Sacral  wound: Noted during last admit. Per primary.  Hx finger amputations, ischemic  HFrEF (25-30%): Perhaps will improve once volume corrected  Myoclonic jerking: lowered dose then dc'd altogether. Jerking resolved.  Dispo - plan is for SNF placement , pending   Kelly Splinter, MD 10/17/2020, 12:59 PM

## 2020-10-17 NOTE — TOC Progression Note (Addendum)
Transition of Care [facility]) - Progression Note    Patient Details  Name: [name] [name] MRN: [id number] Date of Birth: April 12, 1965  Transition of Care [facility]) CM/SW Contact  Sharlet Salina Mila Homer, LCSW Phone Number: 10/17/2020, 5:38 PM  Clinical Narrative:  CSW continuing to seek rehab placement for patient. Spoke with Rosendo Gros, admissions Mudlogger at [facility] and their rehab is currently full. [facility] declined due to patient being on dialysis. Talked with Clarene Critchley at [facility] and they don't have transportation available for patients on Saturdays. CSW informed by a medical staff person (4:51 pm) that [name] and his mother are interested in the [facility] for rehab - [facility] inpatient rehabilitation. CSW attempted to reach someone at the [facility] (5:12 pm), however got voice mail and no message left. CSW visited with patient and he called his mother and update provided. They were informed that the [facility] would be contacted on Monday regarding inpatient rehab. Patient advised that CSW is currently unsure if him being on dialysis will be a barrier, but this will be addressed once CSW talks with someone at the [facility] on Monday.      Expected Discharge Plan: Skilled Nursing Facility Barriers to Discharge: Ship broker, Continued Medical Work up, SNF Pending bed offer  Expected Discharge Plan and Services Expected Discharge Plan: [place] In-house Referral: Clinical Social Work     Living arrangements for the past 2 months: Single Family Home Expected Discharge Date: 10/15/20                                   Social Determinants of Health (SDOH) Interventions    Readmission Risk Interventions No flowsheet data found.

## 2020-10-17 NOTE — Progress Notes (Signed)
Physical Therapy Treatment Patient Details Name: [name] [name] MRN: [id number] DOB: Jul 01, 1965 Today's Date: 10/17/2020    History of Present Illness 55 y/o male with history of IDDM, ESRD who was recently admitted with bilateral heel osteomyelitis and necrotic ulcers. He refused b/l BKA at that time. He was readmitted with worsening bilateral foot pain. He was seen by orthopedics and podiatry who are recommending bilateral bka. Patient is now S/P B BKA.    PT Comments    Pt mother in room on entry, however prefers to leave while pt works with therapy. Pt agreeable to getting out of bed today. Pt requires increased cuing for sequencing of AP transfer to chair. Pt limited in safe mobility by new bilateral BKA, bilateral LE pain, change in center of gravity and decreased UE strength. Pt is modAx2 for coming to longsitting in bed for pivoting to position hips perpendicular to bed for A-P transfer to chair. MaxAx2 for posterior scooting into chair. Once in chair educated on need for strong triceps muscles to aid in mobility. Taught chair push up technique. Pt unable to perform bilaterally however success with side to side. Pt educated of using same side to side technique for offweighting buttocks for pressure relief in sitting. Since first out of bed educated to call for assist back to bed in approx 30 min. Staff notified. D/c plan remains appropriate. PT will continue to follow acutely.   Follow Up Recommendations  SNF     Equipment Recommendations  None recommended by PT       Precautions / Restrictions Precautions Precautions: Fall Other Brace: B Limb protectors, B Wound Vac Restrictions Weight Bearing Restrictions: Yes RLE Weight Bearing: Non weight bearing LLE Weight Bearing: Non weight bearing    Mobility  Bed Mobility Overal bed mobility: Needs Assistance       Supine to sit: Mod assist;HOB elevated     General bed mobility comments: HoB elevated totally, and pt able to  pull to longsitting in bed with modA, modAx2 and maximal multimodal cuing for pivoting hips around to prepare for AP transfer    Transfers Overall transfer level: Needs assistance   Transfers: Anterior-Posterior Transfer       Anterior-Posterior transfers: Max assist;+2 physical assistance;Mod assist   General transfer comment: modAx2 for initial posterior scoot toward EoB, maxAx2 for transfer from bed surface to recliner        Balance Overall balance assessment: Needs assistance Sitting-balance support: Bilateral upper extremity supported;Feet supported Sitting balance-Leahy Scale: Poor Sitting balance - Comments: requires min guard and support of legs in longsitting                                    Cognition Arousal/Alertness: Awake/alert Behavior During Therapy: Flat affect Overall Cognitive Status: Difficult to assess Area of Impairment: Following commands;Problem solving                       Following Commands: Follows one step commands with increased time;Follows multi-step commands with increased time Safety/Judgement: Decreased awareness of safety;Decreased awareness of deficits Awareness: Anticipatory Problem Solving: Slow processing;Requires verbal cues;Requires tactile cues;Decreased initiation;Difficulty sequencing General Comments: requires increased multimodal cuing for sequencing of transfer and for performance of chair push ups.      Exercises Other Exercises Other Exercises: attempted chair push ups however unable to engage both triceps simultaneously, able to utilize single UE and contralateral lean to  offweight one hip at a time. performs 3x each side alternating.    General Comments General comments (skin integrity, edema, etc.): Pt mother in room on entry, but leaves for treatment session, VSS onRA      Pertinent Vitals/Pain Pain Assessment: Faces Faces Pain Scale: Hurts little more Pain Location: B LE and back Pain  Descriptors / Indicators: Discomfort;Grimacing;Shooting Pain Intervention(s): Limited activity within patient's tolerance;Monitored during session;Repositioned;Patient requesting pain meds-RN notified (Lidocaine patch applied)     PT Goals (current goals can now be found in the care plan section) Acute Rehab PT Goals Patient Stated Goal: to decrease pain PT Goal Formulation: With patient Time For Goal Achievement: 10/24/20 Potential to Achieve Goals: Fair Progress towards PT goals: Progressing toward goals    Frequency    Min 3X/week      PT Plan Current plan remains appropriate       AM-PAC PT "6 Clicks" Mobility   Outcome Measure  Help needed turning from your back to your side while in a flat bed without using bedrails?: A Lot Help needed moving from lying on your back to sitting on the side of a flat bed without using bedrails?: A Lot Help needed moving to and from a bed to a chair (including a wheelchair)?: Total Help needed standing up from a chair using your arms (e.g., wheelchair or bedside chair)?: Total Help needed to walk in hospital room?: Total Help needed climbing 3-5 steps with a railing? : Total 6 Click Score: 8    End of Session Equipment Utilized During Treatment: Other (comment) (B limb protectors and bilateral wound vacs) Activity Tolerance: Patient tolerated treatment well Patient left: with call bell/phone within reach;in chair;with chair alarm set Nurse Communication: Mobility status PT Visit Diagnosis: Other abnormalities of gait and mobility (R26.89);Pain Pain - Right/Left:  (bilateral) Pain - part of body: Leg     Time: 1000-1022 PT Time Calculation (min) (ACUTE ONLY): 22 min  Charges:  $Therapeutic Activity: 8-22 mins                     Ras Kollman B. Migdalia Dk PT, DPT Acute Rehabilitation Services Pager [phone] Office [phone]    [place] 10/17/2020, 11:00 AM

## 2020-10-18 LAB — CBC WITH DIFFERENTIAL/PLATELET
Abs Immature Granulocytes: 0.07 K/uL (ref 0.00–0.07)
Basophils Absolute: 0.1 K/uL (ref 0.0–0.1)
Basophils Relative: 1 %
Eosinophils Absolute: 0.1 K/uL (ref 0.0–0.5)
Eosinophils Relative: 1 %
HCT: 37.4 % — ABNORMAL LOW (ref 39.0–52.0)
Hemoglobin: 10.9 g/dL — ABNORMAL LOW (ref 13.0–17.0)
Immature Granulocytes: 1 %
Lymphocytes Relative: 13 %
Lymphs Abs: 1.2 K/uL (ref 0.7–4.0)
MCH: 28.3 pg (ref 26.0–34.0)
MCHC: 29.1 g/dL — ABNORMAL LOW (ref 30.0–36.0)
MCV: 97.1 fL (ref 80.0–100.0)
Monocytes Absolute: 0.8 K/uL (ref 0.1–1.0)
Monocytes Relative: 9 %
Neutro Abs: 7.2 K/uL (ref 1.7–7.7)
Neutrophils Relative %: 75 %
Platelets: 424 K/uL — ABNORMAL HIGH (ref 150–400)
RBC: 3.85 MIL/uL — ABNORMAL LOW (ref 4.22–5.81)
RDW: 16.9 % — ABNORMAL HIGH (ref 11.5–15.5)
WBC: 9.5 K/uL (ref 4.0–10.5)
nRBC: 0 % (ref 0.0–0.2)

## 2020-10-18 LAB — BASIC METABOLIC PANEL
Anion gap: 14 (ref 5–15)
BUN: 30 mg/dL — ABNORMAL HIGH (ref 6–20)
CO2: 24 mmol/L (ref 22–32)
Calcium: 8.2 mg/dL — ABNORMAL LOW (ref 8.9–10.3)
Chloride: 95 mmol/L — ABNORMAL LOW (ref 98–111)
Creatinine, Ser: 4.55 mg/dL — ABNORMAL HIGH (ref 0.61–1.24)
GFR, Estimated: 15 mL/min — ABNORMAL LOW (ref >60)
Glucose, Bld: 116 mg/dL — ABNORMAL HIGH (ref 70–99)
Potassium: 3.9 mmol/L (ref 3.5–5.1)
Sodium: 133 mmol/L — ABNORMAL LOW (ref 135–145)

## 2020-10-18 MED ORDER — HEPARIN SODIUM (PORCINE) 1000 UNIT/ML IJ SOLN
INTRAMUSCULAR | Status: AC
  Filled 2020-10-18: qty 5

## 2020-10-18 NOTE — Progress Notes (Signed)
[facility] Progress Note   Subjective:  Seen in room, no c/o today.   Objective Vitals:   10/17/20 1647 10/17/20 2159 10/18/20 0436 10/18/20 1003  BP: (!) 100/51 122/63 115/76 122/71  Pulse: 99 100 93 93  Resp: '17 16 16 18  '$ Temp:  (!) 97.3 F (36.3 C) (!) 97.3 F (36.3 C) (!) 97.1 F (36.2 C)  TempSrc:  Oral Oral Axillary  SpO2: 100% 100% 100% 96%  Weight:       Physical Exam General: Chronically ill appearing man, NAD. Room air. Heart: RRR; no murmur Lungs: CTA anteriorly Abdomen: soft, non-tender Extremities: 1-2+ bilat hip edema, improving, bilat BKA Dialysis Access: R AVF + bruit  Additional Objective Labs: Basic Metabolic Panel: Recent Labs  Lab 10/13/20 0430 10/14/20 0647 10/18/20 0129  NA 129* 134* 133*  K 4.2 4.2 3.9  CL 91* 93* 95*  CO2 '23 27 24  '$ GLUCOSE 123* 99 116*  BUN 60* 34* 30*  CREATININE 5.96* 4.45* 4.55*  CALCIUM 7.7* 7.7* 8.2*  PHOS 5.6* 4.2  --     Liver Function Tests: Recent Labs  Lab 10/13/20 0430 10/14/20 0647  ALBUMIN 2.1* 2.1*    CBC: Recent Labs  Lab 10/12/20 0330 10/13/20 0430 10/14/20 0647 10/18/20 0129  WBC 13.8* 10.4 9.0 9.5  NEUTROABS  --   --   --  7.2  HGB 10.8* 10.0* 11.1* 10.9*  HCT 36.1* 34.0* 36.8* 37.4*  MCV 96.0 95.8 95.1 97.1  PLT 433* 437* 422* 424*    Iron Studies:  No results for input(s): IRON, TIBC, TRANSFERRIN, FERRITIN in the last 72 hours.  Medications:    (feeding supplement) PROSource Plus  30 mL Oral BID BM   vitamin C  1,000 mg Oral Daily   aspirin  325 mg Oral Daily   atorvastatin  10 mg Oral QPM   calcium acetate  1,334 mg Oral TID WC   Chlorhexidine Gluconate Cloth  6 each Topical Q0600   cinacalcet  30 mg Oral Q breakfast   collagenase   Topical Daily   cycloSPORINE  1 drop Both Eyes Daily   heparin  5,000 Units Subcutaneous Q8H   insulin aspart  0-6 Units Subcutaneous Q4H   insulin glargine  5 Units Subcutaneous Daily   lidocaine  1 patch Transdermal Q24H    midodrine  5 mg Oral Q T,Th,Sat-1800   nutrition supplement (JUVEN)  1 packet Oral BID BM   pantoprazole  40 mg Oral Daily   sucroferric oxyhydroxide  500 mg Oral Q breakfast   zinc sulfate  220 mg Oral Daily    Dialysis Orders: TTS at [facility] --> need to clarify  3h 67mn  450/1.5  90kg  2/2.5 bath AVF  Hep 6000 +500u/hr - Aranesp 1041m IV q Thur - [facility] 41m27mIV q HD   Assessment/Plan: B heel wounds/osteomyelitis: SP B BKA Dr. DudSharol Given 7/22. Volume:bilat pitting hip edema 3+ has improved to 1-2+, unable to get good weights w/ air bed. New dry wt should be around 84- 85kg. Tolerating 3-4 L UF every HD so far here.   ESRD: Usual TTS sched. HD today.   Hypertension: No antihypertensive meds - on mido '5mg'$  pre  Anemia: Hgb 8.8 - sp Aranesp here, dc'd due to Hb 11 now. SP prbc's  Metabolic bone disease: Ca ok, Phos ok. Continue binders (Phoslo), sensipar.  Nutrition: Alb very low - continue supplements.  Sacral wound: Noted during last admit. Per primary.  Hx finger amputations, ischemic  HFrEF (25-30%): Perhaps will improve once volume corrected  Myoclonic jerking: lowered dose then dc'd altogether. Jerking resolved.  Dispo - plan is for SNF placement , pending   Kelly Splinter, MD 10/18/2020, 3:09 PM

## 2020-10-18 NOTE — Plan of Care (Signed)
  Problem: Education: Goal: Knowledge of General Education information will improve Description: Including pain rating scale, medication(s)/side effects and non-pharmacologic comfort measures Outcome: Progressing   Problem: Clinical Measurements: Goal: Ability to maintain clinical measurements within normal limits will improve Outcome: Progressing   Problem: Nutrition: Goal: Adequate nutrition will be maintained Outcome: Progressing   Problem: Coping: Goal: Level of anxiety will decrease Outcome: Progressing   Problem: Skin Integrity: Goal: Risk for impaired skin integrity will decrease Outcome: Progressing   Problem: Education: Goal: Knowledge of disease and its progression will improve Outcome: Progressing

## 2020-10-18 NOTE — Progress Notes (Signed)
Patient seen and examined at the bedside this morning.  Hemodynamically stable during my evaluation.  His mother was at the bedside.  He denied any new complaints.  He still has wound vacs in bilateral lower extremity amputation sites.  He is medically stable for discharge as soon as bed is available at the skilled nursing facility.  There is difficulty finding placement for him.  There is no new change in the medical management.  I will contact Dr. Sharol Given  to check about his wound VAC status.

## 2020-10-19 DIAGNOSIS — M86679 Other chronic osteomyelitis, unspecified ankle and foot: Secondary | ICD-10-CM | POA: Diagnosis not present

## 2020-10-19 LAB — BASIC METABOLIC PANEL
Anion gap: 15 (ref 5–15)
BUN: 26 mg/dL — ABNORMAL HIGH (ref 6–20)
CO2: 24 mmol/L (ref 22–32)
Calcium: 8.1 mg/dL — ABNORMAL LOW (ref 8.9–10.3)
Chloride: 93 mmol/L — ABNORMAL LOW (ref 98–111)
Creatinine, Ser: 4.22 mg/dL — ABNORMAL HIGH (ref 0.61–1.24)
GFR, Estimated: 16 mL/min — ABNORMAL LOW (ref >60)
Glucose, Bld: 141 mg/dL — ABNORMAL HIGH (ref 70–99)
Potassium: 4.8 mmol/L (ref 3.5–5.1)
Sodium: 132 mmol/L — ABNORMAL LOW (ref 135–145)

## 2020-10-19 NOTE — Plan of Care (Signed)
  Problem: Education: Goal: Knowledge of General Education information will improve Description: Including pain rating scale, medication(s)/side effects and non-pharmacologic comfort measures Outcome: Progressing   Problem: Clinical Measurements: Goal: Will remain free from infection Outcome: Progressing Goal: Respiratory complications will improve Outcome: Progressing   Problem: Activity: Goal: Risk for activity intolerance will decrease Outcome: Progressing   Problem: Nutrition: Goal: Adequate nutrition will be maintained Outcome: Progressing   Problem: Elimination: Goal: Will not experience complications related to bowel motility Outcome: Progressing   Problem: Pain Managment: Goal: General experience of comfort will improve Outcome: Progressing

## 2020-10-19 NOTE — Progress Notes (Signed)
[name] NOTE    SCIPIO PRESSNALL  [id number] DOB: 09/24/1965 DOA: 10/07/2020 PCP: [name] [name], [name]   Chief Complain: Bilateral foot ulcers  Brief Narrative: Patient is a 55 year old male with history of insulin-dependent diabetes [name] [name], [name] [name] the same but he refused bilateral BKA at that time.  Orthopedics was consulted during this admission and he underwent bilateral BKA.  Current plan is to discharge him skilled nursing facility.  Medically stable [name] discharge.  Nephrology following [name] [name].TOC looking [name] beds. 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  Was recommended to have bilateral BKA on last admission but refused.  Started on broad-spectrum antibiotics on admission.  Orthopedics consulted and he underwent bilateral BKA, wound VAC on both stumps. Wound vacs have been d/ced.  He needs to follow-up with orthopedics as an outpatient in a week   [name] [name]: [name] on TTS schedule.  Nephrology  following. Continue Sensipar and PhosLo   Insulin-dependent diabetes type 2:  He is chronically on insulin pump.Shd be continued on dc   [name] normocytic anemia: Secondary to [name].  Currently hemoglobin stable.  He was transfused with a unit of PRBC preoperatively in anticipation [name] surgery.   Acute on [name] systolic congestive heart failure: Last known EF of 25 to 30%.  Volume management as per [name].   Disposition: PT/OT recommending SNF on discharge.     Pressure Injury 10/08/20 Vertebral column Lower Unstageable - Full thickness tissue loss in which the base of the injury is covered by  slough (yellow, tan, gray, green or brown) and/or eschar (tan, brown or black) in the wound bed. (Active)  10/08/20 0100  Location: Vertebral column  Location Orientation: Lower  Staging: Unstageable - Full thickness tissue loss in which the base of the injury is covered by slough (yellow, tan, gray, green or brown) and/or eschar (tan, brown or black) in the wound bed.  Wound Description (Comments):   Present on Admission: Yes     Pressure Injury 10/08/20 Coccyx Unstageable - Full thickness tissue loss in which the base of the injury is covered by slough (yellow, tan, gray, green or brown) and/or eschar (tan, brown or black) in the wound bed. (Active)  10/08/20 0100  Location: Coccyx  Location Orientation:   Staging: Unstageable - Full thickness tissue loss in which the base of the injury is covered by slough (yellow, tan, gray, green or brown) and/or eschar (tan, brown or black) in the wound bed.  Wound Description (Comments):   Present on Admission: Yes     Pressure Injury Buttocks (Active)     Location: Buttocks  Location Orientation:   Staging:   Wound Description (Comments):   Present on Admission:      Pressure Injury Buttocks Bilateral Unstageable - Full thickness tissue loss in which the base of the injury is covered by slough (yellow, tan, gray, green or brown) and/or eschar (tan, brown or black) in the wound bed. (Active)     Location: Buttocks  Location Orientation: Bilateral  Staging: Unstageable - Full thickness tissue loss in which the base of the injury is covered by slough (yellow, tan, gray, green or brown) and/or eschar (tan,  brown or black) in the wound bed.  Wound Description (Comments):   Present on Admission: Yes     Pressure Injury Thigh Posterior;Proximal;Right Unstageable - Full thickness tissue loss in which the base of the injury is covered by slough (yellow, tan, gray, green or brown) and/or eschar (tan, brown or black) in the wound bed. (Active)      Location: Thigh  Location Orientation: Posterior;Proximal;Right  Staging: Unstageable - Full thickness tissue loss in which the base of the injury is covered by slough (yellow, tan, gray, green or brown) and/or eschar (tan, brown or black) in the wound bed.  Wound Description (Comments):   Present on Admission: Yes                DVT prophylaxis:Heparin [place] Code Status: Full Family Communication: Mom at bedside Status is: Inpatient  Remains inpatient appropriate because:Unsafe d/c plan  Dispo: The patient is from: Home              Anticipated d/c is to: SNF              Patient currently is medically stable to d/c.   Difficult to place patient No    Consultants: Nephrology, orthopedics  Procedures: Bilateral BKA  Antimicrobials:  Anti-infectives (From admission, onward)    Start     Dose/Rate Route Frequency Ordered Stop   10/10/20 1951  vancomycin (VANCOCIN) 1-5 GM/200ML-% IVPB       Note to Pharmacy: Obas, [place]   : cabinet override      10/10/20 1951 10/11/20 0759   10/10/20 1915  vancomycin (VANCOCIN) IVPB 1000 mg/200 mL premix        1,000 mg 200 mL/hr over 60 Minutes Intravenous Every Fri (Hemodialysis) 10/10/20 1902 10/10/20 2100   10/10/20 1902  vancomycin variable dose per unstable renal function (pharmacist dosing)  Status:  Discontinued         Does not apply See admin instructions 10/10/20 1902 10/11/20 1229   10/10/20 1000  ceFAZolin (ANCEF) IVPB 2g/100 mL premix        2 g 200 mL/hr over 30 Minutes Intravenous On call to O.R. 10/10/20 0903 10/10/20 1421   10/09/20 1200  vancomycin (VANCOCIN) IVPB 1000 mg/200 mL premix  Status:  Discontinued        1,000 mg 200 mL/hr over 60 Minutes Intravenous Every T-Th-Sa (Hemodialysis) 10/08/20 0308 10/10/20 1902   10/08/20 1200  vancomycin (VANCOCIN) IVPB 1000 mg/200 mL premix        1,000 mg 200 mL/hr over 60 Minutes Intravenous Every Wed (Hemodialysis) 10/08/20 0829 10/08/20 1133   10/08/20 0600   piperacillin-tazobactam (ZOSYN) IVPB 2.25 g        2.25 g 100 mL/hr over 30 Minutes Intravenous Every 8 hours 10/08/20 0308 10/11/20 2359   10/07/20 1515  piperacillin-tazobactam (ZOSYN) IVPB 3.375 g        3.375 g 100 mL/hr over 30 Minutes Intravenous  Once 10/07/20 1500 10/07/20 1707   10/07/20 1515  vancomycin (VANCOCIN) 2,250 mg in sodium chloride 0.9 % 500 mL IVPB        2,250 mg 250 mL/hr over 120 Minutes Intravenous  Once 10/07/20 1500 10/07/20 1931       Subjective:  Patient seen and examined the bedside this morning.  Hemodynamically stable.  Denies any complaints today.  Objective: Vitals:   10/18/20 2224 10/18/20 2354 10/19/20 0529 10/19/20 1011  BP: 124/72 126/72 (!) 138/94 121/72  Pulse:  94 99 96  Resp:  18  18 16  Temp: 99 F (37.2 C) 99.5 F (37.5 C) 97.9 F (36.6 C) 98.4 F (36.9 C)  TempSrc: Oral Oral    SpO2:  98% 97% 100%  Weight:        Intake/Output Summary (Last 24 hours) at 10/19/2020 1156 Last data filed at 10/18/2020 2224 Gross per 24 hour  Intake 480 ml  Output 1069 ml  Net -589 ml   Filed Weights   10/08/20 1148 10/10/20 0611 10/15/20 0823  Weight: 91 kg 91 kg 91 kg    Examination:  General exam: Overall comfortable, not in distress.  Obese HEENT: PERRL Respiratory system:  no wheezes or crackles  Cardiovascular system: S1 & S2 heard, RRR.  Gastrointestinal system: Abdomen is nondistended, soft and nontender. Central nervous system: Alert and oriented Extremities: Bilateral BKA ,[name] access on right upper extremity Skin: pressure ulcers as above     Data Reviewed: I have personally reviewed following labs and imaging studies  CBC: Recent Labs  Lab 10/13/20 0430 10/14/20 0647 10/18/20 0129  WBC 10.4 9.0 9.5  NEUTROABS  --   --  7.2  HGB 10.0* 11.1* 10.9*  HCT 34.0* 36.8* 37.4*  MCV 95.8 95.1 97.1  PLT 437* 422* 123456*   Basic Metabolic Panel: Recent Labs  Lab 10/13/20 0430 10/14/20 0647 10/18/20 0129  10/19/20 0944  NA 129* 134* 133* 132*  K 4.2 4.2 3.9 4.8  CL 91* 93* 95* 93*  CO2 '23 27 24 24  '$ GLUCOSE 123* 99 116* 141*  BUN 60* 34* 30* 26*  CREATININE 5.96* 4.45* 4.55* 4.22*  CALCIUM 7.7* 7.7* 8.2* 8.1*  PHOS 5.6* 4.2  --   --    GFR: Estimated Creatinine Clearance: 20.7 mL/min (A) (by C-G formula based on SCr of 4.22 mg/dL (H)). Liver Function Tests: Recent Labs  Lab 10/13/20 0430 10/14/20 0647  ALBUMIN 2.1* 2.1*   No results [name] input(s): LIPASE, AMYLASE in the last 168 hours. No results [name] input(s): AMMONIA in the last 168 hours.  Coagulation Profile: No results [name] input(s): INR, PROTIME in the last 168 hours. Cardiac Enzymes: No results [name] input(s): CKTOTAL, CKMB, CKMBINDEX, TROPONINI in the last 168 hours. BNP (last 3 results) No results [name] input(s): PROBNP in the last 8760 hours. HbA1C: No results [name] input(s): HGBA1C in the last 72 hours. CBG: Recent Labs  Lab 10/12/20 2122  GLUCAP 126*   Lipid Profile: No results [name] input(s): CHOL, HDL, LDLCALC, TRIG, CHOLHDL, LDLDIRECT in the last 72 hours. Thyroid Function Tests: No results [name] input(s): TSH, T4TOTAL, FREET4, T3FREE, THYROIDAB in the last 72 hours. Anemia Panel: No results [name] input(s): VITAMINB12, FOLATE, FERRITIN, TIBC, IRON, RETICCTPCT in the last 72 hours. Sepsis Labs: No results [name] input(s): PROCALCITON, LATICACIDVEN in the last 168 hours.   Recent Results (from the past 240 hour(s))  Surgical pcr screen     Status: None   Collection Time: 10/10/20  9:12 AM   Specimen: Nasal Mucosa; Nasal Swab  Result Value Ref Range Status   MRSA, PCR NEGATIVE NEGATIVE Final   Staphylococcus aureus NEGATIVE NEGATIVE Final    Comment: (NOTE) The Xpert SA Assay (FDA approved [name] NASAL specimens in patients 63 years of age and older), is one component of a comprehensive surveillance program. It is not intended to diagnose infection nor to guide or monitor treatment. Performed at [facility] [place] [address], [place], [place] [address]          Radiology Studies: No results found.  Scheduled Meds:  (feeding supplement) PROSource Plus  30 mL Oral BID BM   vitamin C  1,000 mg Oral Daily   aspirin  325 mg Oral Daily   atorvastatin  10 mg Oral QPM   calcium acetate  1,334 mg Oral TID WC   Chlorhexidine Gluconate Cloth  6 each Topical Q0600   cinacalcet  30 mg Oral Q breakfast   collagenase   Topical Daily   cycloSPORINE  1 drop Both Eyes Daily   heparin  5,000 Units Subcutaneous Q8H   insulin aspart  0-6 Units Subcutaneous Q4H   insulin glargine  5 Units Subcutaneous Daily   lidocaine  1 patch Transdermal Q24H   midodrine  5 mg Oral Q T,Th,Sat-1800   nutrition supplement (JUVEN)  1 packet Oral BID BM   pantoprazole  40 mg Oral Daily   sucroferric oxyhydroxide  500 mg Oral Q breakfast   zinc sulfate  220 mg Oral Daily   Continuous Infusions:   LOS: 12 days    Time spent:25 mins. More than 50% of that time was spent in counseling and/or coordination of care.      Shelly Coss, [name] [facility] P7/31/2022, 11:56 AM

## 2020-10-19 NOTE — TOC Progression Note (Signed)
Transition of Care [facility]) - Progression Note    Patient Details  Name: OPIE OPALINSKI MRN: [id number] Date of Birth: February 23, 1966  [facility] CM/SW Contact  Reece Agar, [place] Phone Number: 10/19/2020, 4:14 PM  Clinical Narrative:    CSW attempted to reach someone at the [facility] for rehab - [facility] inpatient rehabilitation. There was no answer, CSW left VM.   Expected Discharge Plan: Skilled Nursing Facility Barriers to Discharge: Ship broker, Continued Medical Work up, SNF Pending bed offer  Expected Discharge Plan and Services Expected Discharge Plan: [place] In-house Referral: Clinical Social Work     Living arrangements for the past 2 months: Single Family Home Expected Discharge Date: 10/15/20                                     Social Determinants of Health (SDOH) Interventions    Readmission Risk Interventions No flowsheet data found.

## 2020-10-19 NOTE — Progress Notes (Signed)
[facility] Progress Note   Subjective:  Seen in room, no c/o today.   Objective Vitals:   10/18/20 2224 10/18/20 2354 10/19/20 0529 10/19/20 1011  BP: 124/72 126/72 (!) 138/94 121/72  Pulse:  94 99 96  Resp:  '18 18 16  '$ Temp: 99 F (37.2 C) 99.5 F (37.5 C) 97.9 F (36.6 C) 98.4 F (36.9 C)  TempSrc: Oral Oral    SpO2:  98% 97% 100%  Weight:       Physical Exam General: Chronically ill appearing man, NAD. Room air. Heart: RRR; no murmur Lungs: CTA anteriorly Abdomen: soft, non-tender Extremities: 1-2+ bilat hip edema, improving, bilat BKA Dialysis Access: R AVF + bruit  Additional Objective Labs: Basic Metabolic Panel: Recent Labs  Lab 10/13/20 0430 10/14/20 0647 10/18/20 0129 10/19/20 0944  NA 129* 134* 133* 132*  K 4.2 4.2 3.9 4.8  CL 91* 93* 95* 93*  CO2 '23 27 24 24  '$ GLUCOSE 123* 99 116* 141*  BUN 60* 34* 30* 26*  CREATININE 5.96* 4.45* 4.55* 4.22*  CALCIUM 7.7* 7.7* 8.2* 8.1*  PHOS 5.6* 4.2  --   --     Liver Function Tests: Recent Labs  Lab 10/13/20 0430 10/14/20 0647  ALBUMIN 2.1* 2.1*    CBC: Recent Labs  Lab 10/13/20 0430 10/14/20 0647 10/18/20 0129  WBC 10.4 9.0 9.5  NEUTROABS  --   --  7.2  HGB 10.0* 11.1* 10.9*  HCT 34.0* 36.8* 37.4*  MCV 95.8 95.1 97.1  PLT 437* 422* 424*    Iron Studies:  No results for input(s): IRON, TIBC, TRANSFERRIN, FERRITIN in the last 72 hours.  Medications:    (feeding supplement) PROSource Plus  30 mL Oral BID BM   vitamin C  1,000 mg Oral Daily   aspirin  325 mg Oral Daily   atorvastatin  10 mg Oral QPM   calcium acetate  1,334 mg Oral TID WC   Chlorhexidine Gluconate Cloth  6 each Topical Q0600   cinacalcet  30 mg Oral Q breakfast   collagenase   Topical Daily   cycloSPORINE  1 drop Both Eyes Daily   heparin  5,000 Units Subcutaneous Q8H   insulin aspart  0-6 Units Subcutaneous Q4H   insulin glargine  5 Units Subcutaneous Daily   lidocaine  1 patch Transdermal Q24H    midodrine  5 mg Oral Q T,Th,Sat-1800   nutrition supplement (JUVEN)  1 packet Oral BID BM   pantoprazole  40 mg Oral Daily   sucroferric oxyhydroxide  500 mg Oral Q breakfast   zinc sulfate  220 mg Oral Daily    Dialysis Orders: TTS at [facility] --> need to clarify  3h 21mn  450/1.5  90kg  2/2.5 bath AVF  Hep 6000 +500u/hr - Aranesp 1030m IV q Thur - Zemplar 20m56mIV q HD   Assessment/Plan: B heel wounds/osteomyelitis: SP B BKA Dr. DudSharol Given 7/22. Volume:bilat pitting hip edema improved here. Wt's are not helpful. +, unable to get good weights w/ air bed. New dry wt should be around 84- 85kg.   ESRD: Usual TTS sched. Next HD 8/02.   Hypertension: No antihypertensive meds - on mido '5mg'$  pre  Anemia: Hgb 8.8 - sp Aranesp here, dc'd due to Hb 11 now. SP prbc's  Metabolic bone disease: Ca ok, Phos ok. Continue binders (Phoslo), sensipar.  Nutrition: Alb very low - continue supplements.  Sacral wound: Noted during last admit. Per primary.  Hx finger amputations,  ischemic  HFrEF (25-30%)  Myoclonic jerking: lowered dose then dc'd altogether. Jerking resolved.  Dispo - plan is for SNF placement , pending   Kelly Splinter, MD 10/19/2020, 1:07 PM

## 2020-10-20 DIAGNOSIS — M86679 Other chronic osteomyelitis, unspecified ankle and foot: Secondary | ICD-10-CM | POA: Diagnosis not present

## 2020-10-20 LAB — BASIC METABOLIC PANEL
Anion gap: 14 (ref 5–15)
BUN: 32 mg/dL — ABNORMAL HIGH (ref 6–20)
CO2: 25 mmol/L (ref 22–32)
Calcium: 8 mg/dL — ABNORMAL LOW (ref 8.9–10.3)
Chloride: 94 mmol/L — ABNORMAL LOW (ref 98–111)
Creatinine, Ser: 4.95 mg/dL — ABNORMAL HIGH (ref 0.61–1.24)
GFR, Estimated: 13 mL/min — ABNORMAL LOW (ref >60)
Glucose, Bld: 120 mg/dL — ABNORMAL HIGH (ref 70–99)
Potassium: 4 mmol/L (ref 3.5–5.1)
Sodium: 133 mmol/L — ABNORMAL LOW (ref 135–145)

## 2020-10-20 MED ORDER — MIDODRINE HCL 5 MG PO TABS
10.0000 mg | ORAL_TABLET | Freq: Three times a day (TID) | ORAL | Status: DC
  Administered 2020-10-22 – 2020-10-28 (×13): 10 mg via ORAL
  Filled 2020-10-20 (×19): qty 2

## 2020-10-20 MED ORDER — PARICALCITOL 5 MCG/ML IV SOLN
7.0000 ug | INTRAVENOUS | Status: DC
  Administered 2020-10-21 – 2020-10-28 (×4): 7 ug via INTRAVENOUS
  Filled 2020-10-20 (×9): qty 1.4

## 2020-10-20 NOTE — TOC Progression Note (Signed)
Transition of Care [facility]) - Progression Note    Patient Details  Name: [name] [name] MRN: [id number] Date of Birth: [birth year]  Transition of Care [facility]) CM/SW Contact  Sharlet Salina Mila Homer, LCSW Phone Number: 10/20/2020, 5:46 PM  Clinical Narrative:  [facility] inpatient rehab [facility] contacted [phone]) today and message left regarding patient and request for inpatient rehab. CSW will await call back and will follow-up later tomorrow if a call is not received.     Expected Discharge Plan: Skilled Nursing Facility Barriers to Discharge: Ship broker, Continued Medical Work up, SNF Pending bed offer  Expected Discharge Plan and Services Expected Discharge Plan: [place] In-house Referral: Clinical Social Work     Living arrangements for the past 2 months: Single Family Home Expected Discharge Date: 10/15/20                                     Social Determinants of Health (SDOH) Interventions  No SDOH interventions requested or needed at this time.  Readmission Risk Interventions No flowsheet data found.

## 2020-10-20 NOTE — Plan of Care (Signed)
  Problem: Education: Goal: Knowledge of General Education information will improve Description: Including pain rating scale, medication(s)/side effects and non-pharmacologic comfort measures Outcome: Progressing   Problem: Nutrition: Goal: Adequate nutrition will be maintained Outcome: Progressing   Problem: Pain Managment: Goal: General experience of comfort will improve Outcome: Progressing   Problem: Education: Goal: Knowledge of disease and its progression will improve Outcome: Progressing

## 2020-10-20 NOTE — Plan of Care (Signed)
  Problem: Education: Goal: Knowledge of General Education information will improve Description: Including pain rating scale, medication(s)/side effects and non-pharmacologic comfort measures Outcome: Progressing   Problem: Health Behavior/Discharge Planning: Goal: Ability to manage health-related needs will improve Outcome: Progressing   Problem: Activity: Goal: Risk for activity intolerance will decrease Outcome: Progressing   Problem: Nutrition: Goal: Adequate nutrition will be maintained Outcome: Progressing   Problem: Pain Managment: Goal: General experience of comfort will improve Outcome: Progressing   Problem: Skin Integrity: Goal: Risk for impaired skin integrity will decrease Outcome: Progressing   

## 2020-10-20 NOTE — Progress Notes (Signed)
PROGRESS NOTE    [name] [name]  [id number] DOB: 24-Oct-1965 DOA: 10/07/2020 PCP: [name], MD   Chief Complain: Bilateral foot ulcers  Brief Narrative: Patient is a 55 year old male with history of insulin-dependent diabetes melitis, ESRD on dialysis, chronic bilateral heel osteomyelitis/necrotic ulcer was admitted for worsening bilateral foot pain.  He was recently admitted for the same but he refused bilateral BKA at that time.  Orthopedics was consulted during this admission and he underwent bilateral BKA.  Current plan is to discharge him skilled nursing facility.  Medically stable for discharge.  Nephrology following for dialysis.TOC following for placement.  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  Was recommended to have bilateral BKA on last admission but refused.  Started on broad-spectrum antibiotics on admission.  Orthopedics consulted and he underwent bilateral BKA, wound VAC on both stumps. Wound vacs have been d/ced.  He needs to follow-up with orthopedics as an outpatient in a week   ESRD on dialysis: Dialysis on TTS schedule.  Nephrology  following. Continue Sensipar and PhosLo   Insulin-dependent diabetes type 2:  He is chronically on insulin pump.Shd be continued on dc.  Continue current insulin regimen here.   Chronic normocytic anemia: Secondary to ESRD.  Currently hemoglobin stable.  He was transfused with a unit of PRBC preoperatively in anticipation for surgery.   Acute on chronic systolic congestive heart failure: Last known EF of 25 to 30%.  Volume management as per dialysis.   Disposition: PT/OT recommending SNF on discharge.     Pressure Injury 10/08/20 Vertebral column Lower Unstageable - Full thickness tissue  loss in which the base of the injury is covered by slough (yellow, tan, gray, green or brown) and/or eschar (tan, brown or black) in the wound bed. (Active)  10/08/20 0100  Location: Vertebral column  Location Orientation: Lower  Staging: Unstageable - Full thickness tissue loss in which the base of the injury is covered by slough (yellow, tan, gray, green or brown) and/or eschar (tan, brown or black) in the wound bed.  Wound Description (Comments):   Present on Admission: Yes     Pressure Injury 10/08/20 Coccyx Unstageable - Full thickness tissue loss in which the base of the injury is covered by slough (yellow, tan, gray, green or brown) and/or eschar (tan, brown or black) in the wound bed. (Active)  10/08/20 0100  Location: Coccyx  Location Orientation:   Staging: Unstageable - Full thickness tissue loss in which the base of the injury is covered by slough (yellow, tan, gray, green or brown) and/or eschar (tan, brown or black) in the wound bed.  Wound Description (Comments):   Present on Admission: Yes     Pressure Injury Buttocks (Active)     Location: Buttocks  Location Orientation:   Staging:   Wound Description (Comments):   Present on Admission:      Pressure Injury Buttocks Bilateral Unstageable - Full thickness tissue loss in which the base of the injury is covered by slough (yellow, tan, gray, green or brown) and/or eschar (tan, brown or black) in the wound bed. (Active)     Location: Buttocks  Location Orientation: Bilateral  Staging: Unstageable - Full thickness tissue loss in which the base of the injury is covered by slough (yellow, tan,  gray, green or brown) and/or eschar (tan, brown or black) in the wound bed.  Wound Description (Comments):   Present on Admission: Yes     Pressure Injury Thigh Posterior;Proximal;Right Unstageable - Full thickness tissue loss in which the base of the injury is covered by slough (yellow, tan, gray, green or brown) and/or eschar (tan,  brown or black) in the wound bed. (Active)     Location: Thigh  Location Orientation: Posterior;Proximal;Right  Staging: Unstageable - Full thickness tissue loss in which the base of the injury is covered by slough (yellow, tan, gray, green or brown) and/or eschar (tan, brown or black) in the wound bed.  Wound Description (Comments):   Present on Admission: Yes                DVT prophylaxis:Heparin [place] Code Status: Full Family Communication: Mom at bedside Status is: Inpatient  Remains inpatient appropriate because:Unsafe d/c plan  Dispo: The patient is from: Home              Anticipated d/c is to: SNF              Patient currently is medically stable to d/c.   Difficult to place patient No    Consultants: Nephrology, orthopedics  Procedures: Bilateral BKA  Antimicrobials:  Anti-infectives (From admission, onward)    Start     Dose/Rate Route Frequency Ordered Stop   10/10/20 1951  vancomycin (VANCOCIN) 1-5 GM/200ML-% IVPB       Note to Pharmacy: Obas, [place]   : cabinet override      10/10/20 1951 10/11/20 0759   10/10/20 1915  vancomycin (VANCOCIN) IVPB 1000 mg/200 mL premix        1,000 mg 200 mL/hr over 60 Minutes Intravenous Every Fri (Hemodialysis) 10/10/20 1902 10/10/20 2100   10/10/20 1902  vancomycin variable dose per unstable renal function (pharmacist dosing)  Status:  Discontinued         Does not apply See admin instructions 10/10/20 1902 10/11/20 1229   10/10/20 1000  ceFAZolin (ANCEF) IVPB 2g/100 mL premix        2 g 200 mL/hr over 30 Minutes Intravenous On call to O.R. 10/10/20 0903 10/10/20 1421   10/09/20 1200  vancomycin (VANCOCIN) IVPB 1000 mg/200 mL premix  Status:  Discontinued        1,000 mg 200 mL/hr over 60 Minutes Intravenous Every T-Th-Sa (Hemodialysis) 10/08/20 0308 10/10/20 1902   10/08/20 1200  vancomycin (VANCOCIN) IVPB 1000 mg/200 mL premix        1,000 mg 200 mL/hr over 60 Minutes Intravenous Every Wed (Hemodialysis)  10/08/20 0829 10/08/20 1133   10/08/20 0600  piperacillin-tazobactam (ZOSYN) IVPB 2.25 g        2.25 g 100 mL/hr over 30 Minutes Intravenous Every 8 hours 10/08/20 0308 10/11/20 2359   10/07/20 1515  piperacillin-tazobactam (ZOSYN) IVPB 3.375 g        3.375 g 100 mL/hr over 30 Minutes Intravenous  Once 10/07/20 1500 10/07/20 1707   10/07/20 1515  vancomycin (VANCOCIN) 2,250 mg in sodium chloride 0.9 % 500 mL IVPB        2,250 mg 250 mL/hr over 120 Minutes Intravenous  Once 10/07/20 1500 10/07/20 1931       Subjective:  Patient seen and examined the bedside this morning.  Hemodynamically stable.  Denies any complaints today.  Waiting for skilled nursing facility placement.  Mom are waiting to talk to social worker  Objective: Vitals:   10/19/20 1714  10/19/20 2214 10/20/20 0517 10/20/20 1007  BP: 113/73 109/70 123/74 116/71  Pulse: 98 96 94 93  Resp: '18 16 18 17  '$ Temp: 98.7 F (37.1 C) 98.2 F (36.8 C) 97.9 F (36.6 C) 98 F (36.7 C)  TempSrc:  Oral Oral Oral  SpO2: 100% 98% 100% 97%  Weight:        Intake/Output Summary (Last 24 hours) at 10/20/2020 1314 Last data filed at 10/20/2020 0900 Gross per 24 hour  Intake 240 ml  Output --  Net 240 ml   Filed Weights   10/08/20 1148 10/10/20 0611 10/15/20 0823  Weight: 91 kg 91 kg 91 kg    Examination:   General exam: Overall comfortable, not in distress, chronically ill looking, obese HEENT: PERRL Respiratory system:  no wheezes or crackles  Cardiovascular system: S1 & S2 heard, RRR.  Gastrointestinal system: Abdomen is nondistended, soft and nontender. Central nervous system: Alert and oriented Extremities: Bilateral BKA, dialysis access on the right upper extremity Skin: Pressure ulcers described as above.    Data Reviewed: I have personally reviewed following labs and imaging studies  CBC: Recent Labs  Lab 10/14/20 0647 10/18/20 0129  WBC 9.0 9.5  NEUTROABS  --  7.2  HGB 11.1* 10.9*  HCT 36.8* 37.4*  MCV  95.1 97.1  PLT 422* 123456*   Basic Metabolic Panel: Recent Labs  Lab 10/14/20 0647 10/18/20 0129 10/19/20 0944 10/20/20 0145  NA 134* 133* 132* 133*  K 4.2 3.9 4.8 4.0  CL 93* 95* 93* 94*  CO2 '27 24 24 25  '$ GLUCOSE 99 116* 141* 120*  BUN 34* 30* 26* 32*  CREATININE 4.45* 4.55* 4.22* 4.95*  CALCIUM 7.7* 8.2* 8.1* 8.0*  PHOS 4.2  --   --   --    GFR: Estimated Creatinine Clearance: 17.7 mL/min (A) (by C-G formula based on SCr of 4.95 mg/dL (H)). Liver Function Tests: Recent Labs  Lab 10/14/20 0647  ALBUMIN 2.1*   No results for input(s): LIPASE, AMYLASE in the last 168 hours. No results for input(s): AMMONIA in the last 168 hours.  Coagulation Profile: No results for input(s): INR, PROTIME in the last 168 hours. Cardiac Enzymes: No results for input(s): CKTOTAL, CKMB, CKMBINDEX, TROPONINI in the last 168 hours. BNP (last 3 results) No results for input(s): PROBNP in the last 8760 hours. HbA1C: No results for input(s): HGBA1C in the last 72 hours. CBG: No results for input(s): GLUCAP in the last 168 hours.  Lipid Profile: No results for input(s): CHOL, HDL, LDLCALC, TRIG, CHOLHDL, LDLDIRECT in the last 72 hours. Thyroid Function Tests: No results for input(s): TSH, T4TOTAL, FREET4, T3FREE, THYROIDAB in the last 72 hours. Anemia Panel: No results for input(s): VITAMINB12, FOLATE, FERRITIN, TIBC, IRON, RETICCTPCT in the last 72 hours. Sepsis Labs: No results for input(s): PROCALCITON, LATICACIDVEN in the last 168 hours.   No results found for this or any previous visit (from the past 240 hour(s)).        Radiology Studies: No results found.      Scheduled Meds:  (feeding supplement) PROSource Plus  30 mL Oral BID BM   vitamin C  1,000 mg Oral Daily   aspirin  325 mg Oral Daily   atorvastatin  10 mg Oral QPM   calcium acetate  1,334 mg Oral TID WC   Chlorhexidine Gluconate Cloth  6 each Topical Q0600   cinacalcet  30 mg Oral Q breakfast   collagenase    Topical Daily   cycloSPORINE  1 drop Both Eyes Daily   heparin  5,000 Units Subcutaneous Q8H   insulin aspart  0-6 Units Subcutaneous Q4H   insulin glargine  5 Units Subcutaneous Daily   lidocaine  1 patch Transdermal Q24H   midodrine  10 mg Oral TID WC   nutrition supplement (JUVEN)  1 packet Oral BID BM   pantoprazole  40 mg Oral Daily   [START ON 10/21/2020] paricalcitol  7 mcg Intravenous Q T,Th,Sa-HD   sucroferric oxyhydroxide  500 mg Oral Q breakfast   zinc sulfate  220 mg Oral Daily   Continuous Infusions:   LOS: 13 days    Time spent:25 mins. More than 50% of that time was spent in counseling and/or coordination of care.      Shelly Coss, MD [facility] P8/03/2020, 1:14 PM

## 2020-10-20 NOTE — Progress Notes (Addendum)
[facility] Progress Note   Subjective: Seen in room. No C/Os. Followed as OP by Dr. Joesph July. Planning for HD tomorrow on schedule.   Objective Vitals:   10/19/20 1011 10/19/20 1714 10/19/20 2214 10/20/20 0517  BP: 121/72 113/73 109/70 123/74  Pulse: 96 98 96 94  Resp: '16 18 16 18  '$ Temp: 98.4 F (36.9 C) 98.7 F (37.1 C) 98.2 F (36.8 C) 97.9 F (36.6 C)  TempSrc:   Oral Oral  SpO2: 100% 100% 98% 100%  Weight:       Physical Exam General: Chronically ill appearing male in NAD Heart:S1,S2 no M/R/G Lungs: CTAB slightly decreased in bases posteriorly. No WOB.  Abdomen: SNT Extremities: bilateral BKA stump protectors in place. Edema upper thighs, hips, lower abdomen.  Dialysis Access: R AVF +T/B    Additional Objective Labs: Basic Metabolic Panel: Recent Labs  Lab 10/14/20 0647 10/18/20 0129 10/19/20 0944 10/20/20 0145  NA 134* 133* 132* 133*  K 4.2 3.9 4.8 4.0  CL 93* 95* 93* 94*  CO2 '27 24 24 25  '$ GLUCOSE 99 116* 141* 120*  BUN 34* 30* 26* 32*  CREATININE 4.45* 4.55* 4.22* 4.95*  CALCIUM 7.7* 8.2* 8.1* 8.0*  PHOS 4.2  --   --   --    Liver Function Tests: Recent Labs  Lab 10/14/20 0647  ALBUMIN 2.1*   No results for input(s): LIPASE, AMYLASE in the last 168 hours. CBC: Recent Labs  Lab 10/14/20 0647 10/18/20 0129  WBC 9.0 9.5  NEUTROABS  --  7.2  HGB 11.1* 10.9*  HCT 36.8* 37.4*  MCV 95.1 97.1  PLT 422* 424*   Blood Culture    Component Value Date/Time   SDES BLOOD SITE NOT SPECIFIED 10/08/2020 0404   SPECREQUEST  10/08/2020 0404    BOTTLES DRAWN AEROBIC AND ANAEROBIC Blood Culture adequate volume   CULT  10/08/2020 0404    NO GROWTH 5 DAYS Performed at [facility], [place] [address] [place], [place] [address]    REPTSTATUS 10/13/2020 FINAL 10/08/2020 0404    Cardiac Enzymes: No results for input(s): CKTOTAL, CKMB, CKMBINDEX, TROPONINI in the last 168 hours. CBG: No results for input(s): GLUCAP in the last 168  hours. Iron Studies: No results for input(s): IRON, TIBC, TRANSFERRIN, FERRITIN in the last 72 hours. '@lablastinr3'$ @ Studies/Results: No results found. Medications:   (feeding supplement) PROSource Plus  30 mL Oral BID BM   vitamin C  1,000 mg Oral Daily   aspirin  325 mg Oral Daily   atorvastatin  10 mg Oral QPM   calcium acetate  1,334 mg Oral TID WC   Chlorhexidine Gluconate Cloth  6 each Topical Q0600   cinacalcet  30 mg Oral Q breakfast   collagenase   Topical Daily   cycloSPORINE  1 drop Both Eyes Daily   heparin  5,000 Units Subcutaneous Q8H   insulin aspart  0-6 Units Subcutaneous Q4H   insulin glargine  5 Units Subcutaneous Daily   lidocaine  1 patch Transdermal Q24H   midodrine  5 mg Oral Q T,Th,Sat-1800   nutrition supplement (JUVEN)  1 packet Oral BID BM   pantoprazole  40 mg Oral Daily   sucroferric oxyhydroxide  500 mg Oral Q breakfast   zinc sulfate  220 mg Oral Daily     Dialysis Orders: TTS at [facility] --> need to clarify  3h 32mn  450/1.5  90kg  2/2.5 bath AVF  Hep 6000 +500u/hr - Aranesp 1037m  IV q Thur - Zemplar 42mg IV q HD   Assessment/Plan: B heel wounds/osteomyelitis: SP B BKA Dr. DSharol Givenon 7/22. Volume:bilat pitting hip edema improved here. Wt's are not helpful. +, unable to get good weights w/ air bed. New dry wt should be around 84- 85kg. Continue lowering volume as tolerated.   ESRD: Usual TTS sched via [facility]-  Next HD 8/02.   Hypertension: No antihypertensive meds - on mido '5mg'$  pre HD. Can increase dose if needed for volume removal.   Anemia: 10.9 10/18/20 - ESA on hold. SP 1 unit prbc's [id number] Metabolic bone disease: Ca ok, Phos ok. Continue binders (Phoslo and velphoro), sensipar.  Vit D has not been ordered here-  will order.  Will check phos with HD tomorrow  Nutrition: Alb very low - continue supplements.  Sacral wound: Noted during last admit. Per primary.  Hx finger amputations, ischemic  HFrEF (25-30%)  Myoclonic jerking:  Discontinued Gabapentin with resolution of Sx.  Dispo - plan is for SNF placement , pending-  will likely need to change HD units upon SNF placement ?   Rita H. Brown NP-C 10/20/2020, 8:36 AM  [facility] [phone] Patient seen and examined, agree with above note with above modifications. No new c/o's - planning for HD tomorrow-  trying to get EDW down as able - will inc midodrine.  Awaiting placement  KCorliss Parish MD 10/20/2020

## 2020-10-21 DIAGNOSIS — M86679 Other chronic osteomyelitis, unspecified ankle and foot: Secondary | ICD-10-CM | POA: Diagnosis not present

## 2020-10-21 LAB — RENAL FUNCTION PANEL
Albumin: 2.3 g/dL — ABNORMAL LOW (ref 3.5–5.0)
Anion gap: 15 (ref 5–15)
BUN: 40 mg/dL — ABNORMAL HIGH (ref 6–20)
CO2: 25 mmol/L (ref 22–32)
Calcium: 8 mg/dL — ABNORMAL LOW (ref 8.9–10.3)
Chloride: 91 mmol/L — ABNORMAL LOW (ref 98–111)
Creatinine, Ser: 6.2 mg/dL — ABNORMAL HIGH (ref 0.61–1.24)
GFR, Estimated: 10 mL/min — ABNORMAL LOW (ref >60)
Glucose, Bld: 108 mg/dL — ABNORMAL HIGH (ref 70–99)
Phosphorus: 5.9 mg/dL — ABNORMAL HIGH (ref 2.5–4.6)
Potassium: 4.3 mmol/L (ref 3.5–5.1)
Sodium: 131 mmol/L — ABNORMAL LOW (ref 135–145)

## 2020-10-21 LAB — CBC
HCT: 35.8 % — ABNORMAL LOW (ref 39.0–52.0)
Hemoglobin: 10.4 g/dL — ABNORMAL LOW (ref 13.0–17.0)
MCH: 28 pg (ref 26.0–34.0)
MCHC: 29.1 g/dL — ABNORMAL LOW (ref 30.0–36.0)
MCV: 96.2 fL (ref 80.0–100.0)
Platelets: 549 10*3/uL — ABNORMAL HIGH (ref 150–400)
RBC: 3.72 MIL/uL — ABNORMAL LOW (ref 4.22–5.81)
RDW: 16.8 % — ABNORMAL HIGH (ref 11.5–15.5)
WBC: 9 10*3/uL (ref 4.0–10.5)
nRBC: 0 % (ref 0.0–0.2)

## 2020-10-21 MED ORDER — LIDOCAINE-PRILOCAINE 2.5-2.5 % EX CREA: 1.0000 "application " | TOPICAL_CREAM | CUTANEOUS | Status: DC | PRN

## 2020-10-21 MED ORDER — HEPARIN SODIUM (PORCINE) 1000 UNIT/ML DIALYSIS: 3000.0000 [IU] | INTRAMUSCULAR | Status: DC | PRN

## 2020-10-21 MED ORDER — OXYCODONE HCL 5 MG PO TABS
ORAL_TABLET | ORAL | Status: AC
  Administered 2020-10-21: 5 mg via ORAL
  Filled 2020-10-21: qty 1

## 2020-10-21 MED ORDER — SODIUM CHLORIDE 0.9 % IV SOLN: 100.0000 mL | INTRAVENOUS | Status: DC | PRN

## 2020-10-21 MED ORDER — LIDOCAINE HCL (PF) 1 % IJ SOLN: 5.0000 mL | INTRAMUSCULAR | Status: DC | PRN

## 2020-10-21 MED ORDER — HEPARIN SODIUM (PORCINE) 1000 UNIT/ML IJ SOLN
INTRAMUSCULAR | Status: AC
  Filled 2020-10-21: qty 3

## 2020-10-21 MED ORDER — PENTAFLUOROPROP-TETRAFLUOROETH EX AERO: 1.0000 "application " | INHALATION_SPRAY | CUTANEOUS | Status: DC | PRN

## 2020-10-21 MED ORDER — OXYCODONE-ACETAMINOPHEN 5-325 MG PO TABS
ORAL_TABLET | ORAL | Status: AC
  Administered 2020-10-21: 1 via ORAL
  Filled 2020-10-21: qty 1

## 2020-10-21 NOTE — Progress Notes (Signed)
OT Cancellation Note  Patient Details Name: [name] [name] MRN: [id number] DOB: 19-Aug-1965   Cancelled Treatment:    Reason Eval/Treat Not Completed: Patient at procedure or test/ unavailable. Pt at HD, will check back later as time allows  Britt Bottom 10/21/2020, 9:27 AM

## 2020-10-21 NOTE — Progress Notes (Signed)
PT Cancellation Note  Patient Details Name: [name] [name] MRN: [id number] DOB: 04/29/1965   Cancelled Treatment:    Reason Eval/Treat Not Completed: (P) Patient at procedure or test/unavailable Pt is off the floor for HD. PT will follow back this afternoon as able.   Shandell Jallow B. Migdalia Dk PT, DPT Acute Rehabilitation Services Pager [phone] Office [phone]    [name] 10/21/2020, 8:52 AM

## 2020-10-21 NOTE — Progress Notes (Signed)
[facility] Progress Note   Subjective: Seen in dialysis -  difficult for him to get comfortable   Objective Vitals:   10/21/20 0753 10/21/20 0807 10/21/20 0830 10/21/20 0900  BP: 115/66 128/73 (!) 121/58 (!) 123/52  Pulse: 93 92 98 95  Resp: 18     Temp: 97.8 F (36.6 C)     TempSrc: Oral     SpO2: 95%     Weight:       Physical Exam General: Chronically ill appearing male in NAD Heart:S1,S2 no M/R/G Lungs: CTAB slightly decreased in bases posteriorly. No WOB.  Abdomen: SNT Extremities: bilateral BKA stump protectors in place. Edema upper thighs, hips, lower abdomen.  Dialysis Access: R AVF +T/B    Additional Objective Labs: Basic Metabolic Panel: Recent Labs  Lab 10/19/20 0944 10/20/20 0145 10/21/20 0530  NA 132* 133* 131*  K 4.8 4.0 4.3  CL 93* 94* 91*  CO2 '24 25 25  '$ GLUCOSE 141* 120* 108*  BUN 26* 32* 40*  CREATININE 4.22* 4.95* 6.20*  CALCIUM 8.1* 8.0* 8.0*  PHOS  --   --  5.9*   Liver Function Tests: Recent Labs  Lab 10/21/20 0530  ALBUMIN 2.3*   No results for input(s): LIPASE, AMYLASE in the last 168 hours. CBC: Recent Labs  Lab 10/18/20 0129 10/21/20 0832  WBC 9.5 9.0  NEUTROABS 7.2  --   HGB 10.9* 10.4*  HCT 37.4* 35.8*  MCV 97.1 96.2  PLT 424* 549*   Blood Culture    Component Value Date/Time   SDES BLOOD SITE NOT SPECIFIED 10/08/2020 0404   SPECREQUEST  10/08/2020 0404    BOTTLES DRAWN AEROBIC AND ANAEROBIC Blood Culture adequate volume   CULT  10/08/2020 0404    NO GROWTH 5 DAYS Performed at [facility], [place] [address], [place], [place] [address]    REPTSTATUS 10/13/2020 FINAL 10/08/2020 0404    Cardiac Enzymes: No results for input(s): CKTOTAL, CKMB, CKMBINDEX, TROPONINI in the last 168 hours. CBG: No results for input(s): GLUCAP in the last 168 hours. Iron Studies: No results for input(s): IRON, TIBC, TRANSFERRIN, FERRITIN in the last 72 hours. '@lablastinr3'$ @ Studies/Results: No results  found. Medications:  sodium chloride     sodium chloride      (feeding supplement) PROSource Plus  30 mL Oral BID BM   vitamin C  1,000 mg Oral Daily   aspirin  325 mg Oral Daily   atorvastatin  10 mg Oral QPM   calcium acetate  1,334 mg Oral TID WC   Chlorhexidine Gluconate Cloth  6 each Topical Q0600   cinacalcet  30 mg Oral Q breakfast   collagenase   Topical Daily   cycloSPORINE  1 drop Both Eyes Daily   heparin  5,000 Units Subcutaneous Q8H   insulin aspart  0-6 Units Subcutaneous Q4H   insulin glargine  5 Units Subcutaneous Daily   lidocaine  1 patch Transdermal Q24H   midodrine  10 mg Oral TID WC   nutrition supplement (JUVEN)  1 packet Oral BID BM   pantoprazole  40 mg Oral Daily   paricalcitol  7 mcg Intravenous Q T,Th,Sa-HD   sucroferric oxyhydroxide  500 mg Oral Q breakfast   zinc sulfate  220 mg Oral Daily     Dialysis Orders: TTS at [facility]  3h 80mn  450/1.5  90kg  2/2.5 bath AVF  Hep 6000 +500u/hr - Aranesp 1073m IV q Thur - [facility] 43m44mIV q HD  Assessment/Plan: B heel wounds/osteomyelitis: SP B BKA Dr. Sharol Given on 7/22. Volume:bilat pitting hip edema improved here. Wt's are not helpful. +, unable to get good weights w/ air bed. New dry wt should be around 84- 85kg. Continue lowering volume as tolerated.   ESRD: Usual TTS sched via AVF-  Next HD 8/02.   Hypertension: No antihypertensive meds - on mido '5mg'$  pre HD- have increased to max for volume removal.   Anemia: 10.4  8/2- ESA on hold. SP 1 unit prbc's [id number]  Metabolic bone disease: Ca ok, Phos 5.9. Continue binders (Phoslo and velphoro), also on sensipar and  Vit D  Nutrition: Alb very low - continue supplements.  Sacral wound: Noted during last admit. Per primary.  Hx finger amputations, ischemic  HFrEF (25-30%)  Myoclonic jerking: Discontinued Gabapentin with resolution of Sx.  Dispo - plan is for SNF placement , pending-  will maybe need to change HD units upon SNF placement ?    Corliss Parish, MD 10/21/2020

## 2020-10-21 NOTE — TOC Progression Note (Signed)
Transition of Care [facility]) - Progression Note    Patient Details  Name: [name] [name] MRN: [id number] Date of Birth: Mar 23, 1965  Transition of [facility] CM/SW Contact  Sharlet Salina Mila Homer, LCSW Phone Number: 10/21/2020, 10:41 AM  Clinical Narrative:  CSW received a call from [facility] with the [place] with [facility] regarding patient. Her questions were answered and she requested that clinicals be faxed [phone]) for review. Lynn's number is [phone].  9:53 pm: Visited room and updated Ms. Belenda Cruise, patient's mother regarding clinicals being sent to the [facility]. She asked about contacts made to facilities in [place] and this was discussed.    Expected Discharge Plan: Skilled Nursing Facility Barriers to Discharge: Ship broker, Continued Medical Work up, SNF Pending bed offer  Expected Discharge Plan and Services Expected Discharge Plan: [place] In-house Referral: Clinical Social Work     Living arrangements for the past 2 months: Single Family Home Expected Discharge Date: 10/15/20                                   Social Determinants of Health (SDOH) Interventions    Readmission Risk Interventions No flowsheet data found.

## 2020-10-21 NOTE — Progress Notes (Signed)
Physical Therapy Treatment Patient Details Name: [name] [name] MRN: [id number] DOB: 07-07-1965 Today's Date: 10/21/2020    History of Present Illness 55 y/o male with history of IDDM, ESRD who was recently admitted with bilateral heel osteomyelitis and necrotic ulcers. He refused b/l BKA at that time. He was readmitted with worsening bilateral foot pain. He was seen by orthopedics and podiatry who are recommending bilateral bka. Patient is now S/P B BKA.    PT Comments    Pt had just finished bath with NT and reports increased pressure in abdomen from fluid build up and back pain. Pt limited in safe mobility today by increased pain and fatigue after HD, however continues to be motivated to get to recliner. Given pt current weakness and decreased safety and deficit awareness continue to recommend A-P transfer to chair. Pt is able to provide increased assist and is currently modAx2 for bed mobility and transfer to chair. D/c plans remain appropriate at this time. PT will continue to follow acutely.    Follow Up Recommendations  SNF     Equipment Recommendations  None recommended by PT       Precautions / Restrictions Precautions Precautions: Fall Required Braces or Orthoses: Other Brace Other Brace: B Limb protectors, B Wound Vac Restrictions Weight Bearing Restrictions: Yes RLE Weight Bearing: Non weight bearing LLE Weight Bearing: Non weight bearing    Mobility  Bed Mobility Overal bed mobility: Needs Assistance       Supine to sit: Mod assist;HOB elevated     General bed mobility comments: HoB elevated totally, and pt able to pull to longsitting in bed with modA, modAx2 and maximal multimodal cuing for pivoting hips around to prepare for AP transfer    Transfers Overall transfer level: Needs assistance   Transfers: Anterior-Posterior Transfer       Anterior-Posterior transfers: +2 physical assistance;Mod assist   General transfer comment: pt with improved ability  to assist in posterior scoot to chair, improved push up from bed surface        Balance Overall balance assessment: Needs assistance Sitting-balance support: Bilateral upper extremity supported;Feet supported Sitting balance-Leahy Scale: Poor Sitting balance - Comments: requires min guard and support of legs in longsitting                                    Cognition Arousal/Alertness: Awake/alert Behavior During Therapy: Flat affect Overall Cognitive Status: Difficult to assess Area of Impairment: Following commands;Problem solving                       Following Commands: Follows one step commands with increased time;Follows multi-step commands with increased time Safety/Judgement: Decreased awareness of safety;Decreased awareness of deficits Awareness: Anticipatory Problem Solving: Slow processing;Requires verbal cues;Requires tactile cues;Decreased initiation;Difficulty sequencing General Comments: requires increased multimodal cuing for sequencing of transfer         General Comments General comments (skin integrity, edema, etc.): VSS      Pertinent Vitals/Pain Pain Assessment: Faces Faces Pain Scale: Hurts little more Pain Location: stomach from fluid build up and B LE and back Pain Descriptors / Indicators: Discomfort;Grimacing;Shooting;Pressure     PT Goals (current goals can now be found in the care plan section) Acute Rehab PT Goals Patient Stated Goal: to decrease pain PT Goal Formulation: With patient Time For Goal Achievement: 10/24/20 Potential to Achieve Goals: Fair Progress towards PT goals: Progressing toward  goals    Frequency    Min 3X/week      PT Plan Current plan remains appropriate       AM-PAC PT "6 Clicks" Mobility   Outcome Measure  Help needed turning from your back to your side while in a flat bed without using bedrails?: A Lot Help needed moving from lying on your back to sitting on the side of a flat  bed without using bedrails?: A Lot Help needed moving to and from a bed to a chair (including a wheelchair)?: Total Help needed standing up from a chair using your arms (e.g., wheelchair or bedside chair)?: Total Help needed to walk in hospital room?: Total Help needed climbing 3-5 steps with a railing? : Total 6 Click Score: 8    End of Session Equipment Utilized During Treatment: Other (comment) (B limb protectors) Activity Tolerance: Patient tolerated treatment well Patient left: with call bell/phone within reach;in chair;with chair alarm set Nurse Communication: Mobility status PT Visit Diagnosis: Other abnormalities of gait and mobility (R26.89);Pain Pain - Right/Left:  (bilateral) Pain - part of body: Leg     Time: [id number] PT Time Calculation (min) (ACUTE ONLY): 13 min  Charges:  $Therapeutic Activity: 8-22 mins                     Alayshia Marini B. Migdalia Dk PT, DPT Acute Rehabilitation Services Pager [phone] Office [phone]    [place] 10/21/2020, 4:28 PM

## 2020-10-21 NOTE — Progress Notes (Signed)
PROGRESS NOTE    [name] [name]  [id number] DOB: Dec 08, 1965 DOA: 10/07/2020 PCP: [name], MD   Chief Complain: Bilateral foot ulcers  Brief Narrative: Patient is a 55 year old male with history of insulin-dependent diabetes melitis, ESRD on dialysis, chronic bilateral heel osteomyelitis/necrotic ulcer was admitted for worsening bilateral foot pain.  He was recently admitted for the same but he refused bilateral BKA at that time.  Orthopedics was consulted during this admission and he underwent bilateral BKA.  Current plan is to discharge him skilled nursing facility.  Medically stable for discharge.  Nephrology following for dialysis.TOC following for placement.  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  Was recommended to have bilateral BKA on last admission but refused.  Started on broad-spectrum antibiotics on admission.  Orthopedics consulted and he underwent bilateral BKA, wound VAC on both stumps. Wound vacs have been d/ced.  He needs to follow-up with orthopedics as an outpatient in a week   ESRD on dialysis: Dialysis on TTS schedule.  Nephrology  following. Continue Sensipar and PhosLo   Insulin-dependent diabetes type 2:  He is chronically on insulin pump.Shd be continued on dc.  Continue current insulin regimen here.   Chronic normocytic anemia: Secondary to ESRD.  Currently hemoglobin stable.  He was transfused with a unit of PRBC preoperatively in anticipation for surgery.   Acute on chronic systolic congestive heart failure: Last known EF of 25 to 30%.  Volume management as per dialysis.   Disposition: PT/OT recommending SNF on discharge.     Pressure Injury 10/08/20 Vertebral column Lower Unstageable - Full thickness tissue  loss in which the base of the injury is covered by slough (yellow, tan, gray, green or brown) and/or eschar (tan, brown or black) in the wound bed. (Active)  10/08/20 0100  Location: Vertebral column  Location Orientation: Lower  Staging: Unstageable - Full thickness tissue loss in which the base of the injury is covered by slough (yellow, tan, gray, green or brown) and/or eschar (tan, brown or black) in the wound bed.  Wound Description (Comments):   Present on Admission: Yes     Pressure Injury 10/08/20 Coccyx Unstageable - Full thickness tissue loss in which the base of the injury is covered by slough (yellow, tan, gray, green or brown) and/or eschar (tan, brown or black) in the wound bed. (Active)  10/08/20 0100  Location: Coccyx  Location Orientation:   Staging: Unstageable - Full thickness tissue loss in which the base of the injury is covered by slough (yellow, tan, gray, green or brown) and/or eschar (tan, brown or black) in the wound bed.  Wound Description (Comments):   Present on Admission: Yes     Pressure Injury Buttocks (Active)     Location: Buttocks  Location Orientation:   Staging:   Wound Description (Comments):   Present on Admission:      Pressure Injury Buttocks Bilateral Unstageable - Full thickness tissue loss in which the base of the injury is covered by slough (yellow, tan, gray, green or brown) and/or eschar (tan, brown or black) in the wound bed. (Active)     Location: Buttocks  Location Orientation: Bilateral  Staging: Unstageable - Full thickness tissue loss in which the base of the injury is covered by slough (yellow, tan,  gray, green or brown) and/or eschar (tan, brown or black) in the wound bed.  Wound Description (Comments):   Present on Admission: Yes     Pressure Injury Thigh Posterior;Proximal;Right Unstageable - Full thickness tissue loss in which the base of the injury is covered by slough (yellow, tan, gray, green or brown) and/or eschar (tan,  brown or black) in the wound bed. (Active)     Location: Thigh  Location Orientation: Posterior;Proximal;Right  Staging: Unstageable - Full thickness tissue loss in which the base of the injury is covered by slough (yellow, tan, gray, green or brown) and/or eschar (tan, brown or black) in the wound bed.  Wound Description (Comments):   Present on Admission: Yes                DVT prophylaxis:Heparin [place] Code Status: Full Family Communication: Mom at bedside on 10/20/20 Status is: Inpatient  Remains inpatient appropriate because:Unsafe d/c plan  Dispo: The patient is from: Home              Anticipated d/c is to: SNF              Patient currently is medically stable to d/c.   Difficult to place patient No    Consultants: Nephrology, orthopedics  Procedures: Bilateral BKA  Antimicrobials:  Anti-infectives (From admission, onward)    Start     Dose/Rate Route Frequency Ordered Stop   10/10/20 1951  vancomycin (VANCOCIN) 1-5 GM/200ML-% IVPB       Note to Pharmacy: Obas, [place]   : cabinet override      10/10/20 1951 10/11/20 0759   10/10/20 1915  vancomycin (VANCOCIN) IVPB 1000 mg/200 mL premix        1,000 mg 200 mL/hr over 60 Minutes Intravenous Every Fri (Hemodialysis) 10/10/20 1902 10/10/20 2100   10/10/20 1902  vancomycin variable dose per unstable renal function (pharmacist dosing)  Status:  Discontinued         Does not apply See admin instructions 10/10/20 1902 10/11/20 1229   10/10/20 1000  ceFAZolin (ANCEF) IVPB 2g/100 mL premix        2 g 200 mL/hr over 30 Minutes Intravenous On call to O.R. 10/10/20 0903 10/10/20 1421   10/09/20 1200  vancomycin (VANCOCIN) IVPB 1000 mg/200 mL premix  Status:  Discontinued        1,000 mg 200 mL/hr over 60 Minutes Intravenous Every T-Th-Sa (Hemodialysis) 10/08/20 0308 10/10/20 1902   10/08/20 1200  vancomycin (VANCOCIN) IVPB 1000 mg/200 mL premix        1,000 mg 200 mL/hr over 60 Minutes Intravenous Every Wed  (Hemodialysis) 10/08/20 0829 10/08/20 1133   10/08/20 0600  piperacillin-tazobactam (ZOSYN) IVPB 2.25 g        2.25 g 100 mL/hr over 30 Minutes Intravenous Every 8 hours 10/08/20 0308 10/11/20 2359   10/07/20 1515  piperacillin-tazobactam (ZOSYN) IVPB 3.375 g        3.375 g 100 mL/hr over 30 Minutes Intravenous  Once 10/07/20 1500 10/07/20 1707   10/07/20 1515  vancomycin (VANCOCIN) 2,250 mg in sodium chloride 0.9 % 500 mL IVPB        2,250 mg 250 mL/hr over 120 Minutes Intravenous  Once 10/07/20 1500 10/07/20 1931       Subjective:  Patient seen and examined at the bedside this morning and dialysis today.  Comfortable during my evaluation.  Denies any new complaints.  Objective: Vitals:   10/21/20 1030 10/21/20 1100 10/21/20 1130 10/21/20 1155  BP:  122/67 138/73 137/69 135/69  Pulse: 94 96 92 94  Resp:    18  Temp:    97.8 F (36.6 C)  TempSrc:    Oral  SpO2:    95%  Weight:        Intake/Output Summary (Last 24 hours) at 10/21/2020 1350 Last data filed at 10/21/2020 1300 Gross per 24 hour  Intake 1160 ml  Output 3500 ml  Net -2340 ml   Filed Weights   10/15/20 0823 10/20/20 2036  Weight: 91 kg 91.1 kg    Examination:  General exam: Overall comfortable, not in distress, chronically ill looking HEENT: PERRL Respiratory system:  no wheezes or crackles  Cardiovascular system: S1 & S2 heard, RRR.  Gastrointestinal system: Abdomen is nondistended, soft and nontender. Central nervous system: Alert and oriented Extremities: Bilateral BKA, dialysis access on the right upper extremity Skin: pressure ulcers as above  Data Reviewed: I have personally reviewed following labs and imaging studies  CBC: Recent Labs  Lab 10/18/20 0129 10/21/20 0832  WBC 9.5 9.0  NEUTROABS 7.2  --   HGB 10.9* 10.4*  HCT 37.4* 35.8*  MCV 97.1 96.2  PLT 424* [id number]*   Basic Metabolic Panel: Recent Labs  Lab 10/18/20 0129 10/19/20 0944 10/20/20 0145 10/21/20 0530  NA 133* 132* 133*  131*  K 3.9 4.8 4.0 4.3  CL 95* 93* 94* 91*  CO2 '24 24 25 25  '$ GLUCOSE 116* 141* 120* 108*  BUN 30* 26* 32* 40*  CREATININE 4.55* 4.22* 4.95* 6.20*  CALCIUM 8.2* 8.1* 8.0* 8.0*  PHOS  --   --   --  5.9*   GFR: Estimated Creatinine Clearance: 14.1 mL/min (A) (by C-G formula based on SCr of 6.2 mg/dL (H)). Liver Function Tests: Recent Labs  Lab 10/21/20 0530  ALBUMIN 2.3*   No results for input(s): LIPASE, AMYLASE in the last 168 hours. No results for input(s): AMMONIA in the last 168 hours.  Coagulation Profile: No results for input(s): INR, PROTIME in the last 168 hours. Cardiac Enzymes: No results for input(s): CKTOTAL, CKMB, CKMBINDEX, TROPONINI in the last 168 hours. BNP (last 3 results) No results for input(s): PROBNP in the last 8760 hours. HbA1C: No results for input(s): HGBA1C in the last 72 hours. CBG: No results for input(s): GLUCAP in the last 168 hours.  Lipid Profile: No results for input(s): CHOL, HDL, LDLCALC, TRIG, CHOLHDL, LDLDIRECT in the last 72 hours. Thyroid Function Tests: No results for input(s): TSH, T4TOTAL, FREET4, T3FREE, THYROIDAB in the last 72 hours. Anemia Panel: No results for input(s): VITAMINB12, FOLATE, FERRITIN, TIBC, IRON, RETICCTPCT in the last 72 hours. Sepsis Labs: No results for input(s): PROCALCITON, LATICACIDVEN in the last 168 hours.   No results found for this or any previous visit (from the past 240 hour(s)).        Radiology Studies: No results found.      Scheduled Meds:  (feeding supplement) PROSource Plus  30 mL Oral BID BM   vitamin C  1,000 mg Oral Daily   aspirin  325 mg Oral Daily   atorvastatin  10 mg Oral QPM   calcium acetate  1,334 mg Oral TID WC   Chlorhexidine Gluconate Cloth  6 each Topical Q0600   cinacalcet  30 mg Oral Q breakfast   collagenase   Topical Daily   cycloSPORINE  1 drop Both Eyes Daily   heparin  5,000 Units Subcutaneous Q8H   insulin aspart  0-6 Units Subcutaneous Q4H  insulin glargine  5 Units Subcutaneous Daily   lidocaine  1 patch Transdermal Q24H   midodrine  10 mg Oral TID WC   nutrition supplement (JUVEN)  1 packet Oral BID BM   pantoprazole  40 mg Oral Daily   paricalcitol  7 mcg Intravenous Q T,Th,Sa-HD   sucroferric oxyhydroxide  500 mg Oral Q breakfast   zinc sulfate  220 mg Oral Daily   Continuous Infusions:   LOS: 14 days    Time spent:25 mins. More than 50% of that time was spent in counseling and/or coordination of care.      Shelly Coss, MD [facility] P8/04/2020, 1:50 PM

## 2020-10-21 NOTE — Procedures (Signed)
Patient was seen on dialysis and the procedure was supervised.  BFR 400  Via AVF BP is  136/72.   Patient appears to be tolerating treatment well  Louis Meckel 10/21/2020

## 2020-10-22 DIAGNOSIS — N186 End stage renal disease: Secondary | ICD-10-CM

## 2020-10-22 DIAGNOSIS — M86171 Other acute osteomyelitis, right ankle and foot: Secondary | ICD-10-CM | POA: Diagnosis not present

## 2020-10-22 DIAGNOSIS — M86679 Other chronic osteomyelitis, unspecified ankle and foot: Secondary | ICD-10-CM | POA: Diagnosis not present

## 2020-10-22 DIAGNOSIS — Z992 Dependence on renal dialysis: Secondary | ICD-10-CM

## 2020-10-22 LAB — RENAL FUNCTION PANEL
Albumin: 2.3 g/dL — ABNORMAL LOW (ref 3.5–5.0)
Anion gap: 15 (ref 5–15)
BUN: 22 mg/dL — ABNORMAL HIGH (ref 6–20)
CO2: 25 mmol/L (ref 22–32)
Calcium: 8.6 mg/dL — ABNORMAL LOW (ref 8.9–10.3)
Chloride: 93 mmol/L — ABNORMAL LOW (ref 98–111)
Creatinine, Ser: 4.28 mg/dL — ABNORMAL HIGH (ref 0.61–1.24)
GFR, Estimated: 16 mL/min — ABNORMAL LOW (ref >60)
Glucose, Bld: 98 mg/dL (ref 70–99)
Phosphorus: 4.5 mg/dL (ref 2.5–4.6)
Potassium: 3.8 mmol/L (ref 3.5–5.1)
Sodium: 133 mmol/L — ABNORMAL LOW (ref 135–145)

## 2020-10-22 MED ORDER — INSULIN ASPART 100 UNIT/ML IJ SOLN
0.0000 [IU] | Freq: Three times a day (TID) | INTRAMUSCULAR | Status: DC
  Administered 2020-10-24: 2 [IU] via SUBCUTANEOUS
  Administered 2020-10-24: 1 [IU] via SUBCUTANEOUS
  Administered 2020-10-25: 2 [IU] via SUBCUTANEOUS
  Administered 2020-10-26 (×2): 1 [IU] via SUBCUTANEOUS
  Administered 2020-10-27: 2 [IU] via SUBCUTANEOUS
  Administered 2020-10-27 – 2020-10-29 (×3): 1 [IU] via SUBCUTANEOUS

## 2020-10-22 NOTE — Progress Notes (Signed)
OT Cancellation Note  Patient Details Name: [name] [name] MRN: [id number] DOB: 08-28-1965   Cancelled Treatment:    Reason Eval/Treat Not Completed: Other (comment). Pt eating lunch at this time, OT will follow up later as time allows Britt [place] 10/22/2020, 12:39 PM

## 2020-10-22 NOTE — Progress Notes (Addendum)
[facility] Progress Note   Subjective: HD yesterday. Net UF 3.5. Says he feels so much better after volume removed, willing to continue lowering volume as tolerated.   Objective Vitals:   10/21/20 1756 10/21/20 2059 10/22/20 0432 10/22/20 0819  BP: 130/74 114/77 109/65 123/73  Pulse: 95 98 (!) 101 (!) 101  Resp: '18 18 16 16  '$ Temp: 97.8 F (36.6 C)  98.4 F (36.9 C) 97.7 F (36.5 C)  TempSrc: Oral   Oral  SpO2: 96% 100% 100% 100%  Weight:        Physical Exam General: Chronically ill appearing male in NAD Heart:S1,S2 no M/R/G Lungs: CTAB slightly decreased in bases posteriorly. No WOB. Abdomen: SNT Extremities: bilateral BKA stump protectors in place. Edema upper thighs, hips, lower abdomen. Dialysis Access: R AVF +T/B   Additional Objective Labs: Basic Metabolic Panel: Recent Labs  Lab 10/20/20 0145 10/21/20 0530 10/22/20 0309  NA 133* 131* 133*  K 4.0 4.3 3.8  CL 94* 91* 93*  CO2 '25 25 25  '$ GLUCOSE 120* 108* 98  BUN 32* 40* 22*  CREATININE 4.95* 6.20* 4.28*  CALCIUM 8.0* 8.0* 8.6*  PHOS  --  5.9* 4.5   Liver Function Tests: Recent Labs  Lab 10/21/20 0530 10/22/20 0309  ALBUMIN 2.3* 2.3*   No results for input(s): LIPASE, AMYLASE in the last 168 hours. CBC: Recent Labs  Lab 10/18/20 0129 10/21/20 0832  WBC 9.5 9.0  NEUTROABS 7.2  --   HGB 10.9* 10.4*  HCT 37.4* 35.8*  MCV 97.1 96.2  PLT 424* 549*   Blood Culture    Component Value Date/Time   SDES BLOOD SITE NOT SPECIFIED 10/08/2020 0404   SPECREQUEST  10/08/2020 0404    BOTTLES DRAWN AEROBIC AND ANAEROBIC Blood Culture adequate volume   CULT  10/08/2020 0404    NO GROWTH 5 DAYS Performed at [facility], [place] [address] [place], [place] [address]    REPTSTATUS 10/13/2020 FINAL 10/08/2020 0404    Cardiac Enzymes: No results for input(s): CKTOTAL, CKMB, CKMBINDEX, TROPONINI in the last 168 hours. CBG: No results for input(s): GLUCAP in the last 168 hours. Iron  Studies: No results for input(s): IRON, TIBC, TRANSFERRIN, FERRITIN in the last 72 hours. '@lablastinr3'$ @ Studies/Results: No results found. Medications:   (feeding supplement) PROSource Plus  30 mL Oral BID BM   vitamin C  1,000 mg Oral Daily   aspirin  325 mg Oral Daily   atorvastatin  10 mg Oral QPM   calcium acetate  1,334 mg Oral TID WC   Chlorhexidine Gluconate Cloth  6 each Topical Q0600   cinacalcet  30 mg Oral Q breakfast   collagenase   Topical Daily   cycloSPORINE  1 drop Both Eyes Daily   heparin  5,000 Units Subcutaneous Q8H   insulin aspart  0-6 Units Subcutaneous Q4H   insulin glargine  5 Units Subcutaneous Daily   lidocaine  1 patch Transdermal Q24H   midodrine  10 mg Oral TID WC   nutrition supplement (JUVEN)  1 packet Oral BID BM   pantoprazole  40 mg Oral Daily   paricalcitol  7 mcg Intravenous Q T,Th,Sa-HD   sucroferric oxyhydroxide  500 mg Oral Q breakfast   zinc sulfate  220 mg Oral Daily     Dialysis Orders: TTS at [facility]  3h 6mn  450/1.5  90kg  2/2.5 bath AVF  Hep 6000 +500u/hr - Aranes[facility] 1056m IV q Thur - [facility] 62m85mIV  q HD   Assessment/Plan: B heel wounds/osteomyelitis: SP B BKA Dr. Sharol Given on 7/22. Volume:bilat pitting hip edema improved here. Wt's are not helpful. +, unable to get good weights w/ air bed. New dry wt should be around 84- 85kg. Continue lowering volume as tolerated.  ESRD: Usual TTS sched via AVF-  Next HD 10/23/2020.   Hypertension: No antihypertensive meds - on mido -- have increased to max for volume removal.  Anemia: 10.4  8/2- ESA on hold. SP 1 unit prbc's [id number]  Metabolic bone disease: Ca ok, Phos 5.9. Continue binders (Phoslo and velphoro-  heard that he sometimes refuses), also on sensipar and  Vit D  Nutrition: Alb very low - continue supplements.  Sacral wound: Noted during last admit. Per primary.  Hx finger amputations, ischemic  HFrEF (25-30%)  Myoclonic jerking: Discontinued Gabapentin with resolution of  Sx. Dispo - plan is for SNF placement , pending-  will maybe need to change HD units upon SNF placement ?  Rita H. Brown NP-C 10/22/2020, 8:43 AM  [facility] [phone]  Patient seen and examined, agree with above note with above modifications. No c/o's-  did well with HD yest-  cont to challenge-  HD tomorrow on schedule-  medically ready for discharge - just waiting  Corliss Parish, MD 10/22/2020

## 2020-10-22 NOTE — TOC Progression Note (Addendum)
Transition of Care [facility]) - Progression Note    Patient Details  Name: [name] [name] MRN: [id number] Date of Birth: Aug 14, 1965  Transition of Care [facility]) CM/SW Contact  Sharlet Salina Mila Homer, LCSW Phone Number: 10/22/2020, 2:45 PM  Clinical Narrative:  Call mae to [facility], admissions director at [facility] regarding patient and unable to take patient as no contract with [place] [facility]. Talked with Maudie Mercury, admissions coordinator for [facility] in [place] is not currently taking dialysis patients. Visited patient's room and update provided to patient and his mother and their questions addressed. They were informed that CSW has not received a call from the person at the [facility] and CSW will f/u this afternoon or tomorrow morning.   2:08 pm: Call mae to Clarene Critchley, admissions director at [facility] to determine if they could take patient if his HD days were change and was informed that no bed available at this time. 2:32 pm: Contacted Allison with [facility] [phone]) and message left.  3:55 pm: CSW talked with Jeani Hawking, inpatient admissions at [facility] inpatient rehab regarding patient. CSW informed that they are unable to accept patient as after reviewing the therapy notes, she does not think patient would be able to tolerate the required 3 hours per day of therapy. [name] and his mother had questions regarding how this decision was made without seeing him and it was explained that she did not need to see him as she had the therapy notes and MD H&P and progress notes. Patient asked for Lynn's number and was provided with the [facility] main number. Patient and his mother was informed that the SNF search [place].  CSW will continue efforts to find SNF placement for patient.       Expected Discharge Plan: Skilled Nursing Facility Barriers to Discharge: Ship broker, Continued Medical Work up, SNF  Pending bed offer  Expected Discharge Plan and Services Expected Discharge Plan: [place] In-house Referral: Clinical Social Work     Living arrangements for the past 2 months: Single Family Home Expected Discharge Date: 10/15/20                                     Social Determinants of Health (SDOH) Interventions    Readmission Risk Interventions No flowsheet data found.

## 2020-10-22 NOTE — Progress Notes (Signed)
PROGRESS NOTE    [name] [name]   [id number]  DOB: March 06, 1966  DOA: 10/07/2020 PCP: [name], MD   Brief Narrative:  [name] [name] a 55 year old male with history of insulin-dependent diabetes melitis, ESRD on dialysis, chronic bilateral heel osteomyelitis/necrotic ulcer was admitted for worsening bilateral foot pain.  He was recently admitted for the same but he refused bilateral BKA at that time.  Orthopedics was consulted during this admission and he underwent bilateral BKA.   Subjective: No complaints.     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   Difficult to place patient Yes      Consultants:  nephrology Procedures:    Antimicrobials:  Anti-infectives (From admission, onward)    Start     Dose/Rate Route Frequency Ordered Stop   10/10/20 1951  vancomycin (VANCOCIN) 1-5 GM/200ML-% IVPB       Note to Pharmacy: Obas, [place]   : cabinet override      10/10/20 1951 10/11/20 0759   10/10/20 1915  vancomycin (VANCOCIN) IVPB 1000 mg/200 mL premix        1,000 mg 200 mL/hr over 60 Minutes Intravenous Every Fri (Hemodialysis) 10/10/20 1902 10/10/20 2100   10/10/20 1902  vancomycin variable dose per unstable renal function (pharmacist dosing)  Status:  Discontinued          Does not apply See admin instructions 10/10/20 1902 10/11/20 1229   10/10/20 1000  ceFAZolin (ANCEF) IVPB 2g/100 mL premix        2 g 200 mL/hr over 30 Minutes Intravenous On call to O.R. 10/10/20 0903 10/10/20 1421   10/09/20 1200  vancomycin (VANCOCIN) IVPB 1000 mg/200 mL premix  Status:  Discontinued        1,000 mg 200 mL/hr over 60 Minutes Intravenous Every T-Th-Sa (Hemodialysis) 10/08/20 0308 10/10/20 1902   10/08/20 1200  vancomycin (VANCOCIN) IVPB 1000 mg/200 mL premix        1,000 mg 200 mL/hr over 60 Minutes Intravenous Every Wed (Hemodialysis) 10/08/20 0829 10/08/20 1133   10/08/20 0600  piperacillin-tazobactam (ZOSYN) IVPB 2.25 g        2.25 g 100 mL/hr over 30 Minutes Intravenous Every 8 hours 10/08/20 0308 10/11/20 2359   10/07/20 1515  piperacillin-tazobactam (ZOSYN) IVPB 3.375 g        3.375 g 100 mL/hr over 30 Minutes Intravenous  Once 10/07/20 1500 10/07/20 1707   10/07/20 1515  vancomycin (VANCOCIN) 2,250 mg in sodium chloride 0.9 % 500 mL IVPB        2,250 mg 250 mL/hr over 120 Minutes Intravenous  Once 10/07/20 1500 10/07/20 1931        Objective: Vitals:   10/21/20 2059 10/22/20 0432  10/22/20 0819 10/22/20 1655  BP: 114/77 109/65 123/73 121/75  Pulse: 98 (!) 101 (!) 101 94  Resp: '18 16 16 17  '$ Temp:  98.4 F (36.9 C) 97.7 F (36.5 C) 98.2 F (36.8 C)  TempSrc:   Oral   SpO2: 100% 100% 100% 100%  Weight:        Intake/Output Summary (Last 24 hours) at 10/22/2020 1753 Last data filed at 10/22/2020 0800 Gross per 24 hour  Intake 440 ml  Output 0 ml  Net 440 ml   Filed Weights   10/15/20 0823 10/20/20 2036  Weight: 91 kg 91.1 kg    Examination: General exam: Appears comfortable  HEENT: PERRLA, oral mucosa moist, no sclera icterus or thrush Respiratory system: Clear to auscultation. Respiratory effort normal. Cardiovascular system: S1 & S2 heard, RRR.   Gastrointestinal system: Abdomen soft, non-tender, nondistended. Normal bowel sounds. Central  nervous system: Alert and oriented. No focal neurological deficits. Extremities: No cyanosis, clubbing or edema Skin: No rashes or ulcers Psychiatry:  Mood & affect appropriate.     Data Reviewed: I have personally reviewed following labs and imaging studies  CBC: Recent Labs  Lab 10/18/20 0129 10/21/20 0832  WBC 9.5 9.0  NEUTROABS 7.2  --   HGB 10.9* 10.4*  HCT 37.4* 35.8*  MCV 97.1 96.2  PLT 424* [id number]*   Basic Metabolic Panel: Recent Labs  Lab 10/18/20 0129 10/19/20 0944 10/20/20 0145 10/21/20 0530 10/22/20 0309  NA 133* 132* 133* 131* 133*  K 3.9 4.8 4.0 4.3 3.8  CL 95* 93* 94* 91* 93*  CO2 '24 24 25 25 25  '$ GLUCOSE 116* 141* 120* 108* 98  BUN 30* 26* 32* 40* 22*  CREATININE 4.55* 4.22* 4.95* 6.20* 4.28*  CALCIUM 8.2* 8.1* 8.0* 8.0* 8.6*  PHOS  --   --   --  5.9* 4.5   GFR: Estimated Creatinine Clearance: 20.5 mL/min (A) (by C-G formula based on SCr of 4.28 mg/dL (H)). Liver Function Tests: Recent Labs  Lab 10/21/20 0530 10/22/20 0309  ALBUMIN 2.3* 2.3*   No results for input(s): LIPASE, AMYLASE in the last 168 hours. No results for input(s): AMMONIA in the last 168 hours. Coagulation Profile: No results for input(s): INR, PROTIME in the last 168 hours. Cardiac Enzymes: No results for input(s): CKTOTAL, CKMB, CKMBINDEX, TROPONINI in the last 168 hours. BNP (last 3 results) No results for input(s): PROBNP in the last 8760 hours. HbA1C: No results for input(s): HGBA1C in the last 72 hours. CBG: No results for input(s): GLUCAP in the last 168 hours. Lipid Profile: No results for input(s): CHOL, HDL, LDLCALC, TRIG, CHOLHDL, LDLDIRECT in the last 72 hours. Thyroid Function Tests: No results for input(s): TSH, T4TOTAL, FREET4, T3FREE, THYROIDAB in the last 72 hours. Anemia Panel: No results for input(s): VITAMINB12, FOLATE, FERRITIN, TIBC, IRON, RETICCTPCT in the last 72 hours. Urine analysis: No results found for: COLORURINE, APPEARANCEUR, LABSPEC,  PHURINE, GLUCOSEU, HGBUR, BILIRUBINUR, KETONESUR, PROTEINUR, UROBILINOGEN, NITRITE, LEUKOCYTESUR Sepsis Labs: '@LABRCNTIP'$ (procalcitonin:4,lacticidven:4) )No results found for this or any previous visit (from the past 240 hour(s)).       Radiology Studies: No results found.    Scheduled Meds:  (feeding supplement) PROSource Plus  30 mL Oral BID BM   vitamin C  1,000 mg Oral Daily   aspirin  325 mg Oral Daily   atorvastatin  10 mg Oral QPM   calcium acetate  1,334 mg Oral TID WC   Chlorhexidine Gluconate Cloth  6 each Topical Q0600  cinacalcet  30 mg Oral Q breakfast   collagenase   Topical Daily   cycloSPORINE  1 drop Both Eyes Daily   heparin  5,000 Units Subcutaneous Q8H   insulin aspart  0-6 Units Subcutaneous Q4H   insulin glargine  5 Units Subcutaneous Daily   lidocaine  1 patch Transdermal Q24H   midodrine  10 mg Oral TID WC   nutrition supplement (JUVEN)  1 packet Oral BID BM   pantoprazole  40 mg Oral Daily   paricalcitol  7 mcg Intravenous Q T,Th,Sa-HD   sucroferric oxyhydroxide  500 mg Oral Q breakfast   zinc sulfate  220 mg Oral Daily   Continuous Infusions:   LOS: 15 days      Debbe Odea, MD [facility] Pager: [email or web address] 10/22/2020, 5:53 PM

## 2020-10-23 ENCOUNTER — Other Ambulatory Visit (HOSPITAL_COMMUNITY): Payer: Self-pay

## 2020-10-23 DIAGNOSIS — M86171 Other acute osteomyelitis, right ankle and foot: Secondary | ICD-10-CM | POA: Diagnosis not present

## 2020-10-23 LAB — RENAL FUNCTION PANEL
Albumin: 2.3 g/dL — ABNORMAL LOW (ref 3.5–5.0)
Anion gap: 17 — ABNORMAL HIGH (ref 5–15)
BUN: 31 mg/dL — ABNORMAL HIGH (ref 6–20)
CO2: 25 mmol/L (ref 22–32)
Calcium: 8.9 mg/dL (ref 8.9–10.3)
Chloride: 89 mmol/L — ABNORMAL LOW (ref 98–111)
Creatinine, Ser: 5.33 mg/dL — ABNORMAL HIGH (ref 0.61–1.24)
GFR, Estimated: 12 mL/min — ABNORMAL LOW (ref >60)
Glucose, Bld: 122 mg/dL — ABNORMAL HIGH (ref 70–99)
Phosphorus: 5.2 mg/dL — ABNORMAL HIGH (ref 2.5–4.6)
Potassium: 3.8 mmol/L (ref 3.5–5.1)
Sodium: 131 mmol/L — ABNORMAL LOW (ref 135–145)

## 2020-10-23 LAB — GLUCOSE, CAPILLARY: Glucose-Capillary: 117 mg/dL — ABNORMAL HIGH (ref 70–99)

## 2020-10-23 MED ORDER — OXYCODONE HCL 5 MG PO TABS
5.0000 mg | ORAL_TABLET | Freq: Once | ORAL | Status: AC
  Administered 2020-10-23: 5 mg via ORAL

## 2020-10-23 MED ORDER — OXYCODONE HCL 5 MG PO TABS
ORAL_TABLET | ORAL | Status: AC
  Filled 2020-10-23: qty 1

## 2020-10-23 NOTE — Progress Notes (Signed)
PPhysical Therapy Treatment Patient Details Name: [name] [name] MRN: [id number] DOB: [birth year] Today's Date: 10/23/2020    History of Present Illness 55 y/o male with history of IDDM, ESRD who was recently admitted with bilateral heel osteomyelitis and necrotic ulcers. He refused b/l BKA at that time. He was readmitted with worsening bilateral foot pain. He was seen by orthopedics and podiatry who are recommending bilateral bka. Patient is now S/P B BKA.    PT Comments    Pt agreeable to getting up in chair and working on self care this morning with PT/OT. Pt making good progress towards his goals however limited in safe mobility by back pain and decreased strength. Pt is modAx2 for bed mobility and mod -min Ax2 for anterior posterior scoot to recliner. Once in recliner removed limb protectors as they were twisted. With removal they were noted to be damp with perspiration. Propped pt residual limbs on pillows. Cleaned protectors and left them off for shrinkers to dry out while pt up in chair. Educated on need for protectors to be placed for return to bed. D/c plans remain appropriate at this time. PT will continue to follow acutely.   Follow Up Recommendations  SNF     Equipment Recommendations  None recommended by PT       Precautions / Restrictions Precautions Precautions: Fall Required Braces or Orthoses: Other Brace Other Brace: B Limb protectors Restrictions Weight Bearing Restrictions: Yes RLE Weight Bearing: Non weight bearing LLE Weight Bearing: Non weight bearing    Mobility  Bed Mobility Overal bed mobility: Needs Assistance       Supine to sit: Mod assist;HOB elevated     General bed mobility comments: HoB elevated, pt able to pull to longsitting in bed with modA posteriorl, modAx2 and maximal multimodal cuing for pivoting hips around to prepare for AP transfer    Transfers Overall transfer level: Needs assistance   Transfers: Anterior-Posterior Transfer        Anterior-Posterior transfers: +2 physical assistance;Mod assist;Min assist   General transfer comment: pt continues to progress his level of assist to scoot back into chair, with bilateral UE scoot and with lateral lean and single hip scoots alternating  Ambulation/Gait             General Gait Details: unable         Balance Overall balance assessment: Needs assistance Sitting-balance support: Bilateral upper extremity supported;Feet supported Sitting balance-Leahy Scale: Poor Sitting balance - Comments: requires min guard and support of legs in longsitting                                    Cognition Arousal/Alertness: Awake/alert Behavior During Therapy: Flat affect Overall Cognitive Status: Difficult to assess Area of Impairment: Following commands;Problem solving                       Following Commands: Follows one step commands with increased time;Follows multi-step commands with increased time Safety/Judgement: Decreased awareness of safety;Decreased awareness of deficits Awareness: Anticipatory Problem Solving: Slow processing;Requires verbal cues;Requires tactile cues;Decreased initiation;Difficulty sequencing General Comments: requires increased multimodal cuing for sequencing of transfer         General Comments General comments (skin integrity, edema, etc.): VSS on RA, once up in chair removed residual limb protectors as condensation build up noted, left off and notified RN to replace with mobilizing back to bed  Pertinent Vitals/Pain Pain Assessment: Faces Faces Pain Scale: Hurts little more Pain Location: B LE and back Pain Descriptors / Indicators: Discomfort;Grimacing;Shooting;Pressure Pain Intervention(s): Limited activity within patient's tolerance;Monitored during session;Repositioned     PT Goals (current goals can now be found in the care plan section) Acute Rehab PT Goals Patient Stated Goal: to decrease  pain PT Goal Formulation: With patient Time For Goal Achievement: 10/24/20 Potential to Achieve Goals: Fair Progress towards PT goals: Progressing toward goals    Frequency    Min 3X/week      PT Plan Current plan remains appropriate    Co-evaluation PT/OT/SLP Co-Evaluation/Treatment: Yes Reason for Co-Treatment: Complexity of the patient's impairments (multi-system involvement);For patient/therapist safety          AM-PAC PT "6 Clicks" Mobility   Outcome Measure  Help needed turning from your back to your side while in a flat bed without using bedrails?: A Lot Help needed moving from lying on your back to sitting on the side of a flat bed without using bedrails?: A Lot Help needed moving to and from a bed to a chair (including a wheelchair)?: Total Help needed standing up from a chair using your arms (e.g., wheelchair or bedside chair)?: Total Help needed to walk in hospital room?: Total Help needed climbing 3-5 steps with a railing? : Total 6 Click Score: 8    End of Session Equipment Utilized During Treatment: Other (comment) (B limb protectors) Activity Tolerance: Patient tolerated treatment well Patient left: with call bell/phone within reach;in chair;with chair alarm set Nurse Communication: Mobility status;Other (comment) (need to replace limb protectors with return to bed) PT Visit Diagnosis: Other abnormalities of gait and mobility (R26.89);Pain Pain - Right/Left:  (bilateral) Pain - part of body: Leg     Time: [id number] PT Time Calculation (min) (ACUTE ONLY): 48 min  Charges:  $Therapeutic Activity: 8-22 mins                     Angelice Piech B. Migdalia Dk PT, DPT Acute Rehabilitation Services Pager [phone] Office [phone]    [place] 10/23/2020, 11:11 AM

## 2020-10-23 NOTE — Progress Notes (Signed)
PROGRESS NOTE    [name] [name]   [id number]  DOB: 06-Apr-1965  DOA: 10/07/2020 PCP: [name], MD   Brief Narrative:  [name] [name] Gregoryis a 55 year old male with history of insulin-dependent diabetes melitis, ESRD on dialysis, chronic bilateral heel osteomyelitis/necrotic ulcer was admitted for worsening bilateral foot pain.  He was recently admitted for the same but he refused bilateral BKA at that time.  Orthopedics was consulted during this admission and he underwent bilateral BKA.   Subjective: No complaints.     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   Difficult to place patient Yes      Consultants:  nephrology Procedures:   B/l BKA Antimicrobials:  Anti-infectives (From admission, onward)    Start     Dose/Rate Route Frequency Ordered Stop   10/10/20 1951  vancomycin (VANCOCIN) 1-5 GM/200ML-% IVPB       Note to Pharmacy: Obas, Henorck   : cabinet override      10/10/20 1951 10/11/20 0759   10/10/20 1915  vancomycin (VANCOCIN) IVPB 1000 mg/200 mL premix        1,000 mg 200 mL/hr over 60  Minutes Intravenous Every Fri (Hemodialysis) 10/10/20 1902 10/10/20 2100   10/10/20 1902  vancomycin variable dose per unstable renal function (pharmacist dosing)  Status:  Discontinued         Does not apply See admin instructions 10/10/20 1902 10/11/20 1229   10/10/20 1000  ceFAZolin (ANCEF) IVPB 2g/100 mL premix        2 g 200 mL/hr over 30 Minutes Intravenous On call to O.R. 10/10/20 0903 10/10/20 1421   10/09/20 1200  vancomycin (VANCOCIN) IVPB 1000 mg/200 mL premix  Status:  Discontinued        1,000 mg 200 mL/hr over 60 Minutes Intravenous Every T-Th-Sa (Hemodialysis) 10/08/20 0308 10/10/20 1902   10/08/20 1200  vancomycin (VANCOCIN) IVPB 1000 mg/200 mL premix        1,000 mg 200 mL/hr over 60 Minutes Intravenous Every Wed (Hemodialysis) 10/08/20 0829 10/08/20 1133   10/08/20 0600  piperacillin-tazobactam (ZOSYN) IVPB 2.25 g        2.25 g 100 mL/hr over 30 Minutes Intravenous Every 8 hours 10/08/20 0308 10/11/20 2359   10/07/20 1515  piperacillin-tazobactam (ZOSYN) IVPB 3.375 g        3.375 g 100 mL/hr over 30 Minutes Intravenous  Once 10/07/20 1500 10/07/20 1707   10/07/20 1515  vancomycin (VANCOCIN) 2,250 mg in sodium chloride 0.9 % 500 mL IVPB  2,250 mg 250 mL/hr over 120 Minutes Intravenous  Once 10/07/20 1500 10/07/20 1931        Objective: Vitals:   10/22/20 0819 10/22/20 1655 10/23/20 0541 10/23/20 1107  BP: 123/73 121/75 117/72 134/85  Pulse: (!) 101 94 97 98  Resp: '16 17 18 18  '$ Temp: 97.7 F (36.5 C) 98.2 F (36.8 C) 98 F (36.7 C) 99 F (37.2 C)  TempSrc: Oral  Oral Oral  SpO2: 100% 100% 95% 99%  Weight:        Intake/Output Summary (Last 24 hours) at 10/23/2020 1258 Last data filed at 10/23/2020 0400 Gross per 24 hour  Intake 1320 ml  Output 0 ml  Net 1320 ml    Filed Weights   10/15/20 0823 10/20/20 2036  Weight: 91 kg 91.1 kg    Examination: General exam: Appears comfortable  HEENT: PERRLA, oral mucosa moist, no sclera icterus or  thrush Respiratory system: Clear to auscultation. Respiratory effort normal. Cardiovascular system: S1 & S2 heard, regular rate and rhythm Gastrointestinal system: Abdomen soft, non-tender, nondistended. Normal bowel sounds   Central nervous system: Alert and oriented. No focal neurological deficits. Extremities: No cyanosis, clubbing or edema- b/l BKA with limb protectors in place Skin: No rashes or ulcers Psychiatry:  Mood & affect appropriate.     Data Reviewed: I have personally reviewed following labs and imaging studies  CBC: Recent Labs  Lab 10/18/20 0129 10/21/20 0832  WBC 9.5 9.0  NEUTROABS 7.2  --   HGB 10.9* 10.4*  HCT 37.4* 35.8*  MCV 97.1 96.2  PLT 424* 549*    Basic Metabolic Panel: Recent Labs  Lab 10/19/20 0944 10/20/20 0145 10/21/20 0530 10/22/20 0309 10/23/20 0359  NA 132* 133* 131* 133* 131*  K 4.8 4.0 4.3 3.8 3.8  CL 93* 94* 91* 93* 89*  CO2 '24 25 25 25 25  '$ GLUCOSE 141* 120* 108* 98 122*  BUN 26* 32* 40* 22* 31*  CREATININE 4.22* 4.95* 6.20* 4.28* 5.33*  CALCIUM 8.1* 8.0* 8.0* 8.6* 8.9  PHOS  --   --  5.9* 4.5 5.2*    GFR: Estimated Creatinine Clearance: 16.4 mL/min (A) (by C-G formula based on SCr of 5.33 mg/dL (H)). Liver Function Tests: Recent Labs  Lab 10/21/20 0530 10/22/20 0309 10/23/20 0359  ALBUMIN 2.3* 2.3* 2.3*    No results for input(s): LIPASE, AMYLASE in the last 168 hours. No results for input(s): AMMONIA in the last 168 hours. Coagulation Profile: No results for input(s): INR, PROTIME in the last 168 hours. Cardiac Enzymes: No results for input(s): CKTOTAL, CKMB, CKMBINDEX, TROPONINI in the last 168 hours. BNP (last 3 results) No results for input(s): PROBNP in the last 8760 hours. HbA1C: No results for input(s): HGBA1C in the last 72 hours. CBG: No results for input(s): GLUCAP in the last 168 hours. Lipid Profile: No results for input(s): CHOL, HDL, LDLCALC, TRIG, CHOLHDL, LDLDIRECT in the last 72 hours. Thyroid  Function Tests: No results for input(s): TSH, T4TOTAL, FREET4, T3FREE, THYROIDAB in the last 72 hours. Anemia Panel: No results for input(s): VITAMINB12, FOLATE, FERRITIN, TIBC, IRON, RETICCTPCT in the last 72 hours. Urine analysis: No results found for: COLORURINE, APPEARANCEUR, LABSPEC, PHURINE, GLUCOSEU, HGBUR, BILIRUBINUR, KETONESUR, PROTEINUR, UROBILINOGEN, NITRITE, LEUKOCYTESUR Sepsis Labs: '@LABRCNTIP'$ (procalcitonin:4,lacticidven:4) )No results found for this or any previous visit (from the past 240 hour(s)).       Radiology Studies: No results found.    Scheduled Meds:  (feeding supplement) PROSource Plus  30 mL Oral BID BM  vitamin C  1,000 mg Oral Daily   aspirin  325 mg Oral Daily   atorvastatin  10 mg Oral QPM   calcium acetate  1,334 mg Oral TID WC   Chlorhexidine Gluconate Cloth  6 each Topical Q0600   cinacalcet  30 mg Oral Q breakfast   collagenase   Topical Daily   cycloSPORINE  1 drop Both Eyes Daily   heparin  5,000 Units Subcutaneous Q8H   insulin aspart  0-6 Units Subcutaneous TID WC   insulin glargine  5 Units Subcutaneous Daily   lidocaine  1 patch Transdermal Q24H   midodrine  10 mg Oral TID WC   nutrition supplement (JUVEN)  1 packet Oral BID BM   pantoprazole  40 mg Oral Daily   paricalcitol  7 mcg Intravenous Q T,Th,Sa-HD   sucroferric oxyhydroxide  500 mg Oral Q breakfast   zinc sulfate  220 mg Oral Daily   Continuous Infusions:   LOS: 16 days      Debbe Odea, MD [facility] Pager: [email or web address] 10/23/2020, 12:58 PM

## 2020-10-23 NOTE — Progress Notes (Addendum)
[facility] Progress Note   Subjective: Seen in room up in chair with out stump protectors. More edema upper thighs than able to visualize with stump protectors in place. He says he can pull 4 liters. HD later today. Increase UF goal. He agrees with this plan.   Objective Vitals:   10/22/20 0432 10/22/20 0819 10/22/20 1655 10/23/20 0541  BP: 109/65 123/73 121/75 117/72  Pulse: (!) 101 (!) 101 94 97  Resp: '16 16 17 18  '$ Temp: 98.4 F (36.9 C) 97.7 F (36.5 C) 98.2 F (36.8 C) 98 F (36.7 C)  TempSrc:  Oral  Oral  SpO2: 100% 100% 100% 95%  Weight:       Physical Exam General: Pleasant, chronically ill appearing male in NAD Heart:S1,S2 no M/R/G Lungs: CTAB slightly decreased in bases posteriorly. No WOB. Abdomen: NABS Extremities: Bilateral BKAs-2+ Edema upper thighs, hips, lower abdomen. Dialysis Access: R AVF +T/B   Additional Objective Labs: Basic Metabolic Panel: Recent Labs  Lab 10/21/20 0530 10/22/20 0309 10/23/20 0359  NA 131* 133* 131*  K 4.3 3.8 3.8  CL 91* 93* 89*  CO2 '25 25 25  '$ GLUCOSE 108* 98 122*  BUN 40* 22* 31*  CREATININE 6.20* 4.28* 5.33*  CALCIUM 8.0* 8.6* 8.9  PHOS 5.9* 4.5 5.2*   Liver Function Tests: Recent Labs  Lab 10/21/20 0530 10/22/20 0309 10/23/20 0359  ALBUMIN 2.3* 2.3* 2.3*   No results for input(s): LIPASE, AMYLASE in the last 168 hours. CBC: Recent Labs  Lab 10/18/20 0129 10/21/20 0832  WBC 9.5 9.0  NEUTROABS 7.2  --   HGB 10.9* 10.4*  HCT 37.4* 35.8*  MCV 97.1 96.2  PLT 424* 549*   Blood Culture    Component Value Date/Time   SDES BLOOD SITE NOT SPECIFIED 10/08/2020 0404   SPECREQUEST  10/08/2020 0404    BOTTLES DRAWN AEROBIC AND ANAEROBIC Blood Culture adequate volume   CULT  10/08/2020 0404    NO GROWTH 5 DAYS Performed at [facility], [place] [address], [place], [place] [address]    REPTSTATUS 10/13/2020 FINAL 10/08/2020 0404    Cardiac Enzymes: No results for input(s): CKTOTAL, CKMB,  CKMBINDEX, TROPONINI in the last 168 hours. CBG: No results for input(s): GLUCAP in the last 168 hours. Iron Studies: No results for input(s): IRON, TIBC, TRANSFERRIN, FERRITIN in the last 72 hours. '@lablastinr3'$ @ Studies/Results: No results found. Medications:   (feeding supplement) PROSource Plus  30 mL Oral BID BM   vitamin C  1,000 mg Oral Daily   aspirin  325 mg Oral Daily   atorvastatin  10 mg Oral QPM   calcium acetate  1,334 mg Oral TID WC   Chlorhexidine Gluconate Cloth  6 each Topical Q0600   cinacalcet  30 mg Oral Q breakfast   collagenase   Topical Daily   cycloSPORINE  1 drop Both Eyes Daily   heparin  5,000 Units Subcutaneous Q8H   insulin aspart  0-6 Units Subcutaneous TID WC   insulin glargine  5 Units Subcutaneous Daily   lidocaine  1 patch Transdermal Q24H   midodrine  10 mg Oral TID WC   nutrition supplement (JUVEN)  1 packet Oral BID BM   pantoprazole  40 mg Oral Daily   paricalcitol  7 mcg Intravenous Q T,Th,Sa-HD   sucroferric oxyhydroxide  500 mg Oral Q breakfast   zinc sulfate  220 mg Oral Daily     Dialysis Orders: [facility] at [facility]  3h 75mn  450/1.5  90kg  2/2.5 bath AVF  Hep 6000 +500u/hr - Aranesp 1107mg IV q Thur - Zemplar 94m IV q HD   Assessment/Plan: B heel wounds/osteomyelitis: SP B BKA Dr. DuSharol Givenn 7/22. Volume:bilat pitting hip edema improved here.  unable to get good weights w/ air bed. Asked nursing staff to pull wound vacs and equipment off bed and re-calibrate bed while pt in chair. New dry wt should be around 84- 85kg. Agrees to attempt 4 liter UFG in HD today.   ESRD: Usual TTS sched via AVF-  Next HD today 10/23/2020.   Hypertension: No antihypertensive meds - on mido -- have increased to max for volume removal.  Anemia: 10.4  8/2- ESA on hold. SP 1 unit prbc's [id number] bone disease: Ca ok, Phos 5.2 Continue binders (Phoslo and velphoro-  heard that he sometimes refuses), also on sensipar and  Vit D  Nutrition: Alb  very low - continue supplements.  Sacral wound: Noted during last admit. Per primary.  Hx finger amputations, ischemic  HFrEF (25-30%)  Myoclonic jerking: Discontinued Gabapentin with resolution of Sx. Dispo - plan is for SNF placement , pending-  will maybe need to change HD units upon SNF placement ?  Rita H. Brown NP-C 10/23/2020, 9:47 AM  [facility] [phone] seen and examined, agree with above note with above modifications. No new issues.  Planning for HD today with volume removal as tolerated and appropriate titration of dialysis related medications-   KeCorliss ParishMD 10/23/2020

## 2020-10-23 NOTE — TOC Progression Note (Addendum)
Transition of Care [facility]) - Progression Note    Patient Details  Name: [name] [name] MRN: [id number] Date of Birth: 1965-08-08  Transition of [facility]) CM/SW Contact  [name] Salina Mila Homer, LCSW Phone Number: 10/23/2020, 5:04 PM  Clinical Narrative:  CSW communicated earlier today with [name] [name] with CIR regarding patient and possibly evaluating him for possible [facility] admission. [name] [name] asked that [name] [name] talked with patient and his mother regarding this. Visited with patient and mother this morning and talked with them regarding [facility]'s willingness to evaluate patient for inpatient rehab.  Both patient and [name] [name] declined evaluation by [facility] CIR and patient expressed concern regarding the minimum of 3 hours per day of therapy. [name] [name] contacted and informed.  Call made to [facility] with [name] [name] and [name] [name] regarding patient and she is willing to review patient's information. Clinicals sent via [place].  5:13 pm - Checked [facility] and [facility] [place] SNF's made bed offers.  5:23 pm - Went to patient's room and he is in dialysis. CSW will talk with patient and his mother on Friday.     Expected Discharge Plan: Skilled Nursing Facility Barriers to Discharge: Ship broker, Continued Medical Work up, SNF Pending bed offer  Expected Discharge Plan and Services Expected Discharge Plan: [place] In-house Referral: Clinical Social Work     Living arrangements for the past 2 months: Single Family Home Expected Discharge Date: 10/15/20                                     Social Determinants of Health (SDOH) Interventions  None requested or needed at this time.  Readmission Risk Interventions No flowsheet data found.

## 2020-10-23 NOTE — Plan of Care (Signed)
  Problem: Education: Goal: Knowledge of General Education information will improve Description Including pain rating scale, medication(s)/side effects and non-pharmacologic comfort measures Outcome: Progressing   Problem: Health Behavior/Discharge Planning: Goal: Ability to manage health-related needs will improve Outcome: Progressing   

## 2020-10-23 NOTE — Progress Notes (Signed)
Occupational Therapy Treatment Patient Details Name: [name] [name] MRN: [id number] DOB: [birth year] Today's Date: 10/23/2020    History of present illness 55 y/o male with history of IDDM, ESRD who was recently admitted with bilateral heel osteomyelitis and necrotic ulcers. He refused b/l BKA at that time. He was readmitted with worsening bilateral foot pain. He was seen by orthopedics and podiatry who are recommending bilateral bka. Patient is now S/P B BKA. (Simultaneous filing. User may not have seen previous data.)   OT comments  Pt presented in bed and agreed to session. Pt was able to complete rolling with bed rails and min to moderate assist to set up for transfer, long sitting with BUE support from moderate to min guard due to bed inflation, moderate assist x2 for AP transfer and cues for hand placement. Pt  was able to completed one in a sitting position and post set up hygiene of face and UE with min assist. Pt currently with functional limitations due to the deficits listed below (see OT Problem List).  Pt will benefit from skilled OT to increase their safety and independence with ADL and functional mobility for ADL to facilitate discharge to venue listed below.    Follow Up Recommendations  SNF    Equipment Recommendations       Recommendations for Other Services      Precautions / Restrictions Precautions Precautions: Fall  Other Brace: B Limb protectors, B Wound Vac Restrictions Weight Bearing Restrictions: Yes RLE Weight Bearing: Non weight bearing LLE Weight Bearing: Non weight bearing       Mobility Bed Mobility Overal bed mobility: Needs Assistance Bed Mobility: Rolling Rolling: Mod assist;+2 for physical assistance;+2 for safety/equipment   Supine to sit: Mod assist;HOB elevated     General bed mobility comments: HoB elevated totally, and pt able to pull to longsitting in bed with modA, modAx2 and maximal multimodal cuing for pivoting hips around to prepare  for AP transfer    Transfers Overall transfer level: Needs assistance   Transfers: Anterior-Posterior Transfer       Anterior-Posterior transfers: +2 physical assistance;Mod assist   General transfer comment: pt continues to progress his level of assist to scoot back into chair, with bilateral UE scoot and with lateral lean and single hip scoots alternating    Balance Overall balance assessment: Needs assistance Sitting-balance support: Bilateral upper extremity supported;Feet supported Sitting balance-Leahy Scale: Poor Sitting balance - Comments: requires min guard and support of legs in longsitting with BUE support                                   ADL either performed or assessed with clinical judgement   ADL Overall ADL's : Needs assistance/impaired Eating/Feeding: Set up;Sitting   Grooming: Wash/dry hands;Wash/dry face;Applying deodorant;Set up;Cueing for safety;Cueing for sequencing;Sitting   Upper Body Bathing: Minimal assistance;Sitting   Lower Body Bathing: Maximal assistance;Bed level   Upper Body Dressing : Set up;Sitting   Lower Body Dressing: Maximal assistance;Bed level   Toilet Transfer: Maximal assistance;+2 for physical assistance;+2 for safety/equipment   Toileting- Clothing Manipulation and Hygiene: Maximal assistance;Bed level   Tub/ Shower Transfer: Total assistance;+2 for physical assistance;+2 for safety/equipment           Vision   Vision Assessment?: No apparent visual deficits   Perception     Praxis      Cognition Arousal/Alertness: Awake/alert (Simultaneous filing. User may not have seen  previous data.) Behavior During Therapy: Flat affect (Simultaneous filing. User may not have seen previous data.) Overall Cognitive Status: Difficult to assess (Simultaneous filing. User may not have seen previous data.) Area of Impairment: Following commands;Problem solving (Simultaneous filing. User may not have seen previous  data.)                       Following Commands: Follows one step commands with increased time;Follows multi-step commands with increased time (Simultaneous filing. User may not have seen previous data.) Safety/Judgement: Decreased awareness of safety;Decreased awareness of deficits (Simultaneous filing. User may not have seen previous data.) Awareness: Anticipatory (Simultaneous filing. User may not have seen previous data.) Problem Solving: Slow processing;Requires verbal cues;Requires tactile cues;Decreased initiation;Difficulty sequencing (Simultaneous filing. User may not have seen previous data.) General Comments: Pt requires in increase time and often does better with demonstration or tacile feed back (Simultaneous filing. User may not have seen previous data.)        Exercises     Shoulder Instructions       General Comments VSS on RA, once up in chair removed residual limb protectors as condensation build up noted, left off and notified RN to replace with mobilizing back to bed    Pertinent Vitals/ Pain       Pain Assessment: Faces (Simultaneous filing. User may not have seen previous data.) Faces Pain Scale: Hurts little more (Simultaneous filing. User may not have seen previous data.) Pain Location: abdomen, BLE, back (Simultaneous filing. User may not have seen previous data.) Pain Descriptors / Indicators: Aching;Discomfort;Pressure (Simultaneous filing. User may not have seen previous data.) Pain Intervention(s): Limited activity within patient's tolerance;Repositioned (Simultaneous filing. User may not have seen previous data.)  Home Living                                          Prior Functioning/Environment              Frequency  Min 2X/week        Progress Toward Goals  OT Goals(current goals can now be found in the care plan section)  Progress towards OT goals: Progressing toward goals  Acute Rehab OT Goals Patient Stated  Goal: to get comfortable OT Goal Formulation: With patient Time For Goal Achievement: 10/25/20 Potential to Achieve Goals: Good ADL Goals Pt Will Perform Grooming: with modified independence;sitting Pt Will Perform Lower Body Bathing: with min assist;sitting/lateral leans Pt Will Perform Lower Body Dressing: with min guard assist;sitting/lateral leans Pt Will Transfer to Toilet: with transfer board;with mod assist Additional ADL Goal #1: Pt will tolerate sitting EOB for 5 mins to completed seated ADL's  Plan Discharge plan remains appropriate    Co-evaluation    PT/OT/SLP Co-Evaluation/Treatment: Yes Reason for Co-Treatment: Complexity of the patient's impairments (multi-system involvement) (Simultaneous filing. User may not have seen previous data.) PT goals addressed during session: Mobility/safety with mobility OT goals addressed during session: ADL's and self-care      AM-PAC OT "6 Clicks" Daily Activity     Outcome Measure   Help from another person eating meals?: None Help from another person taking care of personal grooming?: A Little Help from another person toileting, which includes using toliet, bedpan, or urinal?: Total Help from another person bathing (including washing, rinsing, drying)?: A Lot Help from another person to put on and taking off regular upper body  clothing?: A Little Help from another person to put on and taking off regular lower body clothing?: A Lot 6 Click Score: 15    End of Session    OT Visit Diagnosis: Other abnormalities of gait and mobility (R26.89);Muscle weakness (generalized) (M62.81);Pain Pain - part of body: Leg   Activity Tolerance Patient tolerated treatment well   Patient Left in chair;with call bell/phone within reach;with chair alarm set   Nurse Communication Other (comment) (pain with back)        Time: [id number] OT Time Calculation (min): 47 min  Charges: OT General Charges $OT Visit: 1 Visit OT Treatments $Self  Care/Home Management : 23-37 mins  Joeseph Amor OTR/L  Acute Rehab Services  [phone] office number [phone] pager number    Joeseph Amor 10/23/2020, 11:24 AM

## 2020-10-24 DIAGNOSIS — M86171 Other acute osteomyelitis, right ankle and foot: Secondary | ICD-10-CM

## 2020-10-24 LAB — RENAL FUNCTION PANEL
Albumin: 2.2 g/dL — ABNORMAL LOW (ref 3.5–5.0)
Anion gap: 14 (ref 5–15)
BUN: 24 mg/dL — ABNORMAL HIGH (ref 6–20)
CO2: 24 mmol/L (ref 22–32)
Calcium: 8.7 mg/dL — ABNORMAL LOW (ref 8.9–10.3)
Chloride: 95 mmol/L — ABNORMAL LOW (ref 98–111)
Creatinine, Ser: 4.62 mg/dL — ABNORMAL HIGH (ref 0.61–1.24)
GFR, Estimated: 14 mL/min — ABNORMAL LOW (ref >60)
Glucose, Bld: 212 mg/dL — ABNORMAL HIGH (ref 70–99)
Phosphorus: 4.8 mg/dL — ABNORMAL HIGH (ref 2.5–4.6)
Potassium: 3.9 mmol/L (ref 3.5–5.1)
Sodium: 133 mmol/L — ABNORMAL LOW (ref 135–145)

## 2020-10-24 LAB — GLUCOSE, CAPILLARY: Glucose-Capillary: 223 mg/dL — ABNORMAL HIGH (ref 70–99)

## 2020-10-24 NOTE — Progress Notes (Addendum)
Subjective:  Said tolerated dialysis yesterday 3748 mL UF, tomorrow HD states tried 3.5 L UF.  Now awaiting outpatient rehab placement  Objective Vital signs in last 24 hours: Vitals:   10/23/20 1817 10/23/20 2017 10/24/20 0507 10/24/20 0953  BP: 139/85 (!) 136/115 137/81 118/74  Pulse: 94 96 96 91  Resp: '19 16 20 18  '$ Temp: 98.4 F (36.9 C) 98.4 F (36.9 C) 98 F (36.7 C) 97.8 F (36.6 C)  TempSrc: Oral  Oral Oral  SpO2: 98% 100% 99% 99%  Weight:       Weight change:   Physical Exam: General: Alert pleasant chronically ill male NAD Heart: RRR no MRG Lungs: CTA nonlabored breathing Abdomen: Bowel sounds positive normoactive soft nontender , some lower abdominal wall abdominal edema Extremities: Bilateral BKA's with stump protector around noted some edema in upper thighs hips  dialysis Access: Positive bruit right arm AV fistula  Dialysis Orders: TTS at [facility]  3h 57mn  450/1.5  90kg  2/2.5 bath AVF  Hep 6000 +500u/hr - Aranesp 1025m IV q Thur - [facility] 68m34mIV q HD   Problem/Plan: B heel wounds/osteomyelitis: SP B BKA Dr. DudSharol Given 7/22.  Awaiting rehab OP placement Volume:bilat pitting hip edema continues improvement with UF on HD, unable to get good weights w/ air bed. Asked nursing staff to pull wound vacs and equipment off bed and re-calibrate bed while pt in chair. New dry wt should be around 84- 85kg.  Could be lower after more UF, [facility] to attempt 3.5 liter UFG in HD tomorrow  ESRD: Usual TTS sched via AVF-  Next HD tomorrow on schedule 8/6  Hypertension: No antihypertensive meds - on mido -- have increased to max for volume removal.  Anemia: 10.4  8/2- ESA on hold. SP 1 unit prbc's [id number] bone disease: Ca ok, Phos 5.2 Continue binders (Phoslo and velphoro-  heard that he sometimes refuses), also on sensipar and  Vit D  Nutrition: Alb very low - continue supplements.  Sacral wound: Noted during last admit. Per  primary.  Hx finger amputations, ischemic  HFrEF (25-30%)  Myoclonic jerking: Discontinued Gabapentin with resolution of Sx. Dispo - plan is for SNF placement , pending-  will maybe need to change HD units upon SNF placement ?   DavErnest HaberA-C [facility] Beeper [phone]/2022,10:06 AM  LOS: 17 days   Labs: Basic Metabolic Panel: Recent Labs  Lab 10/21/20 0530 10/22/20 0309 10/23/20 0359  NA 131* 133* 131*  K 4.3 3.8 3.8  CL 91* 93* 89*  CO2 '25 25 25  '$ GLUCOSE 108* 98 122*  BUN 40* 22* 31*  CREATININE 6.20* 4.28* 5.33*  CALCIUM 8.0* 8.6* 8.9  PHOS 5.9* 4.5 5.2*   Liver Function Tests: Recent Labs  Lab 10/21/20 0530 10/22/20 0309 10/23/20 0359  ALBUMIN 2.3* 2.3* 2.3*   No results for input(s): LIPASE, AMYLASE in the last 168 hours. No results for input(s): AMMONIA in the last 168 hours. CBC: Recent Labs  Lab 10/18/20 0129 10/21/20 0832  WBC 9.5 9.0  NEUTROABS 7.2  --   HGB 10.9* 10.4*  HCT 37.4* 35.8*  MCV 97.1 96.2  PLT 424* 549*   Cardiac Enzymes: No results for input(s): CKTOTAL, CKMB, CKMBINDEX, TROPONINI in the last 168 hours. CBG: Recent Labs  Lab 10/23/20 1526  GLUCAP 117*    Studies/Results: No results found. Medications:   (feeding supplement) PROSource Plus  30 mL Oral BID  BM   vitamin C  1,000 mg Oral Daily   aspirin  325 mg Oral Daily   atorvastatin  10 mg Oral QPM   calcium acetate  1,334 mg Oral TID WC   Chlorhexidine Gluconate Cloth  6 each Topical Q0600   cinacalcet  30 mg Oral Q breakfast   collagenase   Topical Daily   cycloSPORINE  1 drop Both Eyes Daily   heparin  5,000 Units Subcutaneous Q8H   insulin aspart  0-6 Units Subcutaneous TID WC   insulin glargine  5 Units Subcutaneous Daily   lidocaine  1 patch Transdermal Q24H   midodrine  10 mg Oral TID WC   nutrition supplement (JUVEN)  1 packet Oral BID BM   pantoprazole  40 mg Oral Daily   paricalcitol  7 mcg Intravenous Q T,Th,Sa-HD    sucroferric oxyhydroxide  500 mg Oral Q breakfast   I have seen and examined this patient and agree with plan and assessment in the above note with renal recommendations/intervention highlighted.  Continue with HD on TTS schedule.  Awaiting outpatient SNF placement.  Governor Rooks Donja Tipping,MD 10/24/2020 12:59 PM

## 2020-10-24 NOTE — TOC Progression Note (Signed)
Transition of Care [facility]) - Progression Note    Patient Details  Name: [name] [name] MRN: [id number] Date of Birth: 1965-09-19  [facility]) CM/SW Contact  Sharlet Salina Mila Homer, LCSW Phone Number: 10/24/2020, 2:05 PM  Clinical Narrative:   CSW visited the room 3/4 times today talking with patient and his mother regarding his discharge to SNF for [place] rehab. They were informed that facility [place] and [facility] [place] made bed offers and that we would work on getting HD temporarily changed if patient agreeable to either facility. Patient's mother declined both, indicating that the facilities are too far away.   CSW reviewed facilities that were contacted and sent clinicals to SNF's in [place] that had not received his information. [facility] made a bed offer and when asked, was provided with patient's HD schedule. Patient and mother informed regarding [facility] and Ms. Fouty expressed not wanting this facility. Patient provided with Medicare.gov information. Patient and mother reminded that he is medically stable for discharge  Contact made contact with S[facility] at [name] (4:09 pm)  regarding patient. She was informed of patient's chair time at [facility] and responded that they could not transport him that early. Soy informed that CSW would make contact with the dialysis center to determine if his  chair time can be temporarily changed.  Visited with patient and mother and update provided that [name] is a possibility, however CSW has to follow-up with the admissions person on Monday, and [facility] [place] if [name] can take him. Patient and mother expressed understanding.      Expected Discharge Plan: Skilled Nursing Facility Barriers to Discharge: Ship broker, Continued Medical Work up, SNF Pending bed offer  Expected Discharge Plan and Services Expected Discharge Plan:  [place] In-house Referral: Clinical Social Work     Living arrangements for the past 2 months: Single Family Home Expected Discharge Date: 10/15/20                                     Social Determinants of Health (SDOH) Interventions  No SDOH interventions requested or needed at this time.  Readmission Risk Interventions No flowsheet data found.

## 2020-10-24 NOTE — Care Management Important Message (Signed)
Important Message  Patient Details  Name: IKENNA SAMRA MRN: [id number] Date of Birth: 12/31/1965   Medicare Important Message Given:  Yes     Kayelynn Abdou Montine Circle 10/24/2020, 3:55 PM

## 2020-10-24 NOTE — Progress Notes (Signed)
PROGRESS NOTE    [name] [name]   [id number]  DOB: Sep 01, 1965  DOA: 10/07/2020 PCP: [name], MD   Brief Narrative:  [name] [name] a 55 year old male with history of insulin-dependent diabetes melitis, ESRD on dialysis, chronic bilateral heel osteomyelitis/necrotic ulcer was admitted for worsening bilateral foot pain.  He was recently admitted for the same but he refused bilateral BKA at that time.  Orthopedics was consulted during this admission and he underwent bilateral BKA.   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   Difficult to place patient Yes      Consultants:  nephrology Procedures:    Antimicrobials:  Anti-infectives (From admission, onward)    Start     Dose/Rate Route Frequency Ordered Stop   10/10/20 1951  vancomycin (VANCOCIN) 1-5 GM/200ML-% IVPB       Note to Pharmacy: Obas, [place]   : cabinet override      10/10/20 1951 10/11/20 0759   10/10/20 1915  vancomycin (VANCOCIN) IVPB 1000 mg/200 mL premix        1,000 mg 200 mL/hr  over 60 Minutes Intravenous Every Fri (Hemodialysis) 10/10/20 1902 10/10/20 2100   10/10/20 1902  vancomycin variable dose per unstable renal function (pharmacist dosing)  Status:  Discontinued         Does not apply See admin instructions 10/10/20 1902 10/11/20 1229   10/10/20 1000  ceFAZolin (ANCEF) IVPB 2g/100 mL premix        2 g 200 mL/hr over 30 Minutes Intravenous On call to O.R. 10/10/20 0903 10/10/20 1421   10/09/20 1200  vancomycin (VANCOCIN) IVPB 1000 mg/200 mL premix  Status:  Discontinued        1,000 mg 200 mL/hr over 60 Minutes Intravenous Every T-Th-Sa (Hemodialysis) 10/08/20 0308 10/10/20 1902   10/08/20 1200  vancomycin (VANCOCIN) IVPB 1000 mg/200 mL premix        1,000 mg 200 mL/hr over 60 Minutes Intravenous Every Wed (Hemodialysis) 10/08/20 0829 10/08/20 1133   10/08/20 0600  piperacillin-tazobactam (ZOSYN) IVPB 2.25 g        2.25 g 100 mL/hr over 30 Minutes Intravenous Every 8 hours 10/08/20 0308 10/11/20 2359   10/07/20 1515  piperacillin-tazobactam (ZOSYN) IVPB 3.375 g        3.375 g 100 mL/hr over 30 Minutes Intravenous  Once 10/07/20 1500 10/07/20 1707   10/07/20 1515  vancomycin (VANCOCIN) 2,250 mg in sodium chloride 0.9 % 500 mL IVPB  2,250 mg 250 mL/hr over 120 Minutes Intravenous  Once 10/07/20 1500 10/07/20 1931        Objective: Vitals:   10/23/20 2017 10/24/20 0507 10/24/20 0953 10/24/20 1827  BP: (!) 136/115 137/81 118/74 116/77  Pulse: 96 96 91 96  Resp: '16 20 18 17  '$ Temp: 98.4 F (36.9 C) 98 F (36.7 C) 97.8 F (36.6 C)   TempSrc:  Oral Oral   SpO2: 100% 99% 99% 100%  Weight:        Intake/Output Summary (Last 24 hours) at 10/24/2020 1934 Last data filed at 10/24/2020 1000 Gross per 24 hour  Intake 680 ml  Output 0 ml  Net 680 ml   Filed Weights   10/15/20 0823 10/20/20 2036  Weight: 91 kg 91.1 kg    Examination: General exam: Appears comfortable  HEENT: PERRLA, oral mucosa moist, no sclera icterus or thrush Respiratory  system: Clear to auscultation. Respiratory effort normal. Cardiovascular system: S1 & S2 heard, RRR.   Gastrointestinal system: Abdomen soft, non-tender, nondistended. Normal bowel sounds. Central nervous system: Alert and oriented. No focal neurological deficits. Extremities: No cyanosis, clubbing or edema Skin: No rashes or ulcers Psychiatry:  Mood & affect appropriate.  MSK--right forearm AV fistula with positive thrill and bruit    Data Reviewed: I have personally reviewed following labs and imaging studies  CBC: Recent Labs  Lab 10/18/20 0129 10/21/20 0832  WBC 9.5 9.0  NEUTROABS 7.2  --   HGB 10.9* 10.4*  HCT 37.4* 35.8*  MCV 97.1 96.2  PLT 424* [id number]*   Basic Metabolic Panel: Recent Labs  Lab 10/20/20 0145 10/21/20 0530 10/22/20 0309 10/23/20 0359 10/24/20 0942  NA 133* 131* 133* 131* 133*  K 4.0 4.3 3.8 3.8 3.9  CL 94* 91* 93* 89* 95*  CO2 '25 25 25 25 24  '$ GLUCOSE 120* 108* 98 122* 212*  BUN 32* 40* 22* 31* 24*  CREATININE 4.95* 6.20* 4.28* 5.33* 4.62*  CALCIUM 8.0* 8.0* 8.6* 8.9 8.7*  PHOS  --  5.9* 4.5 5.2* 4.8*   GFR: Estimated Creatinine Clearance: 19 mL/min (A) (by C-G formula based on SCr of 4.62 mg/dL (H)). Liver Function Tests: Recent Labs  Lab 10/21/20 0530 10/22/20 0309 10/23/20 0359 10/24/20 0942  ALBUMIN 2.3* 2.3* 2.3* 2.2*   No results for input(s): LIPASE, AMYLASE in the last 168 hours. No results for input(s): AMMONIA in the last 168 hours. Coagulation Profile: No results for input(s): INR, PROTIME in the last 168 hours. Cardiac Enzymes: No results for input(s): CKTOTAL, CKMB, CKMBINDEX, TROPONINI in the last 168 hours. BNP (last 3 results) No results for input(s): PROBNP in the last 8760 hours. HbA1C: No results for input(s): HGBA1C in the last 72 hours. CBG: Recent Labs  Lab 10/23/20 1526 10/24/20 1157  GLUCAP 117* 223*   Lipid Profile: No results for input(s): CHOL, HDL, LDLCALC, TRIG, CHOLHDL, LDLDIRECT in the last 72  hours. Thyroid Function Tests: No results for input(s): TSH, T4TOTAL, FREET4, T3FREE, THYROIDAB in the last 72 hours. Anemia Panel: No results for input(s): VITAMINB12, FOLATE, FERRITIN, TIBC, IRON, RETICCTPCT in the last 72 hours. Urine analysis: No results found for: COLORURINE, APPEARANCEUR, LABSPEC, PHURINE, GLUCOSEU, HGBUR, BILIRUBINUR, KETONESUR, PROTEINUR, UROBILINOGEN, NITRITE, LEUKOCYTESUR Sepsis Labs: '@LABRCNTIP'$ (procalcitonin:4,lacticidven:4) )No results found for this or any previous visit (from the past 240 hour(s)).       Radiology Studies: No results found.    Scheduled Meds:  (feeding supplement) PROSource Plus  30 mL Oral BID BM   vitamin C  1,000 mg Oral Daily   aspirin  325 mg Oral Daily   atorvastatin  10 mg Oral QPM   calcium acetate  1,334 mg Oral TID WC   Chlorhexidine Gluconate Cloth  6 each Topical Q0600   cinacalcet  30 mg Oral Q breakfast   collagenase   Topical Daily   cycloSPORINE  1 drop Both Eyes Daily   heparin  5,000 Units Subcutaneous Q8H   insulin aspart  0-6 Units Subcutaneous TID WC   insulin glargine  5 Units Subcutaneous Daily   lidocaine  1 patch Transdermal Q24H   midodrine  10 mg Oral TID WC   nutrition supplement (JUVEN)  1 packet Oral BID BM   pantoprazole  40 mg Oral Daily   paricalcitol  7 mcg Intravenous Q T,Th,Sa-HD   sucroferric oxyhydroxide  500 mg Oral Q breakfast   Continuous Infusions:   LOS: 17 days   Roxan Hockey, MD [facility] Pager: [email or web address] 10/24/2020, 7:34 PM

## 2020-10-25 DIAGNOSIS — M86171 Other acute osteomyelitis, right ankle and foot: Secondary | ICD-10-CM | POA: Diagnosis not present

## 2020-10-25 DIAGNOSIS — N186 End stage renal disease: Secondary | ICD-10-CM | POA: Diagnosis not present

## 2020-10-25 DIAGNOSIS — Z992 Dependence on renal dialysis: Secondary | ICD-10-CM | POA: Diagnosis not present

## 2020-10-25 DIAGNOSIS — M86679 Other chronic osteomyelitis, unspecified ankle and foot: Secondary | ICD-10-CM | POA: Diagnosis not present

## 2020-10-25 LAB — CBC
HCT: 41.6 % (ref 39.0–52.0)
HCT: 37.5 % — ABNORMAL LOW (ref 39.0–52.0)
Hemoglobin: 12.4 g/dL — ABNORMAL LOW (ref 13.0–17.0)
Hemoglobin: 11 g/dL — ABNORMAL LOW (ref 13.0–17.0)
MCH: 28.6 pg (ref 26.0–34.0)
MCH: 28 pg (ref 26.0–34.0)
MCHC: 29.8 g/dL — ABNORMAL LOW (ref 30.0–36.0)
MCHC: 29.3 g/dL — ABNORMAL LOW (ref 30.0–36.0)
MCV: 95.9 fL (ref 80.0–100.0)
MCV: 95.4 fL (ref 80.0–100.0)
Platelets: 373 10*3/uL (ref 150–400)
Platelets: 380 10*3/uL (ref 150–400)
RBC: 4.34 MIL/uL (ref 4.22–5.81)
RBC: 3.93 MIL/uL — ABNORMAL LOW (ref 4.22–5.81)
RDW: 16.6 % — ABNORMAL HIGH (ref 11.5–15.5)
RDW: 16.7 % — ABNORMAL HIGH (ref 11.5–15.5)
WBC: 10.5 10*3/uL (ref 4.0–10.5)
WBC: 11.5 10*3/uL — ABNORMAL HIGH (ref 4.0–10.5)
nRBC: 0 % (ref 0.0–0.2)
nRBC: 0 % (ref 0.0–0.2)

## 2020-10-25 LAB — RENAL FUNCTION PANEL
Albumin: 2.4 g/dL — ABNORMAL LOW (ref 3.5–5.0)
Albumin: 2.2 g/dL — ABNORMAL LOW (ref 3.5–5.0)
Anion gap: 15 (ref 5–15)
Anion gap: 13 (ref 5–15)
BUN: 31 mg/dL — ABNORMAL HIGH (ref 6–20)
BUN: 33 mg/dL — ABNORMAL HIGH (ref 6–20)
CO2: 26 mmol/L (ref 22–32)
CO2: 25 mmol/L (ref 22–32)
Calcium: 8.9 mg/dL (ref 8.9–10.3)
Calcium: 8.6 mg/dL — ABNORMAL LOW (ref 8.9–10.3)
Chloride: 94 mmol/L — ABNORMAL LOW (ref 98–111)
Chloride: 95 mmol/L — ABNORMAL LOW (ref 98–111)
Creatinine, Ser: 5.36 mg/dL — ABNORMAL HIGH (ref 0.61–1.24)
Creatinine, Ser: 5.55 mg/dL — ABNORMAL HIGH (ref 0.61–1.24)
GFR, Estimated: 12 mL/min — ABNORMAL LOW (ref >60)
GFR, Estimated: 11 mL/min — ABNORMAL LOW (ref >60)
Glucose, Bld: 81 mg/dL (ref 70–99)
Glucose, Bld: 113 mg/dL — ABNORMAL HIGH (ref 70–99)
Phosphorus: 4.8 mg/dL — ABNORMAL HIGH (ref 2.5–4.6)
Phosphorus: 5 mg/dL — ABNORMAL HIGH (ref 2.5–4.6)
Potassium: 3.6 mmol/L (ref 3.5–5.1)
Potassium: 3.8 mmol/L (ref 3.5–5.1)
Sodium: 135 mmol/L (ref 135–145)
Sodium: 133 mmol/L — ABNORMAL LOW (ref 135–145)

## 2020-10-25 LAB — HEPATITIS B SURFACE ANTIGEN: Hepatitis B Surface Ag: NONREACTIVE

## 2020-10-25 MED ORDER — MIDODRINE HCL 5 MG PO TABS
ORAL_TABLET | ORAL | Status: AC
  Filled 2020-10-25: qty 2

## 2020-10-25 MED ORDER — SODIUM CHLORIDE 0.9 % IV SOLN: 100.0000 mL | INTRAVENOUS | Status: DC | PRN

## 2020-10-25 MED ORDER — ALTEPLASE 2 MG IJ SOLR: 2.0000 mg | Freq: Once | INTRAMUSCULAR | Status: DC | PRN

## 2020-10-25 MED ORDER — PENTAFLUOROPROP-TETRAFLUOROETH EX AERO: 1.0000 "application " | INHALATION_SPRAY | CUTANEOUS | Status: DC | PRN

## 2020-10-25 MED ORDER — HEPARIN SODIUM (PORCINE) 1000 UNIT/ML DIALYSIS: 3000.0000 [IU] | INTRAMUSCULAR | Status: DC | PRN

## 2020-10-25 MED ORDER — LIDOCAINE-PRILOCAINE 2.5-2.5 % EX CREA: 1.0000 "application " | TOPICAL_CREAM | CUTANEOUS | Status: DC | PRN

## 2020-10-25 MED ORDER — LIDOCAINE HCL (PF) 1 % IJ SOLN: 5.0000 mL | INTRAMUSCULAR | Status: DC | PRN

## 2020-10-25 NOTE — Progress Notes (Signed)
PROGRESS NOTE    [name] [name]   [id number]  DOB: Dec 20, 1965  DOA: 10/07/2020 PCP: [name], MD   Brief Narrative:  [name] [name] a 55 year old male with history of insulin-dependent diabetes melitis, ESRD on dialysis, chronic bilateral heel osteomyelitis/necrotic ulcer was admitted for worsening bilateral foot pain.  He was recently admitted for the same but he refused bilateral BKA at that time.  Orthopedics was consulted during this admission and he underwent bilateral BKA.   Subjective: Having lower back pain in dialysis today.     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   Difficult to place patient Yes   Consultants:  nephrology Procedures:   B/l BKA Antimicrobials:  Anti-infectives (From admission, onward)    Start     Dose/Rate Route Frequency Ordered Stop   10/10/20 1951  vancomycin (VANCOCIN) 1-5 GM/200ML-% IVPB       Note to Pharmacy: Obas, [place]   : cabinet override      10/10/20 1951 10/11/20 0759   10/10/20 1915  vancomycin (VANCOCIN) IVPB  1000 mg/200 mL premix        1,000 mg 200 mL/hr over 60 Minutes Intravenous Every Fri (Hemodialysis) 10/10/20 1902 10/10/20 2100   10/10/20 1902  vancomycin variable dose per unstable renal function (pharmacist dosing)  Status:  Discontinued         Does not apply See admin instructions 10/10/20 1902 10/11/20 1229   10/10/20 1000  ceFAZolin (ANCEF) IVPB 2g/100 mL premix        2 g 200 mL/hr over 30 Minutes Intravenous On call to O.R. 10/10/20 0903 10/10/20 1421   10/09/20 1200  vancomycin (VANCOCIN) IVPB 1000 mg/200 mL premix  Status:  Discontinued        1,000 mg 200 mL/hr over 60 Minutes Intravenous Every T-Th-Sa (Hemodialysis) 10/08/20 0308 10/10/20 1902   10/08/20 1200  vancomycin (VANCOCIN) IVPB 1000 mg/200 mL premix        1,000 mg 200 mL/hr over 60 Minutes Intravenous Every Wed (Hemodialysis) 10/08/20 0829 10/08/20 1133   10/08/20 0600  piperacillin-tazobactam (ZOSYN) IVPB 2.25 g        2.25 g 100 mL/hr over 30 Minutes Intravenous Every 8 hours 10/08/20 0308 10/11/20 2359   10/07/20 1515  piperacillin-tazobactam (ZOSYN) IVPB 3.375 g        3.375 g 100 mL/hr over 30 Minutes Intravenous  Once 10/07/20 1500 10/07/20 1707   10/07/20 1515  vancomycin (VANCOCIN) 2,250  mg in sodium chloride 0.9 % 500 mL IVPB        2,250 mg 250 mL/hr over 120 Minutes Intravenous  Once 10/07/20 1500 10/07/20 1931        Objective: Vitals:   10/25/20 1000 10/25/20 1008 10/25/20 1012 10/25/20 1101  BP: (!) 132/59 (!) 125/45 136/76 121/77  Pulse: 99 83 (!) 101 (!) 101  Resp:    18  Temp:   97.8 F (36.6 C) 98.2 F (36.8 C)  TempSrc:   Oral Oral  SpO2:   100% 100%  Height:        Intake/Output Summary (Last 24 hours) at 10/25/2020 1612 Last data filed at 10/25/2020 1012 Gross per 24 hour  Intake 240 ml  Output 1865 ml  Net -1625 ml    Filed Weights    Examination: General exam: Appears comfortable  HEENT: PERRLA, oral mucosa moist, no sclera icterus or thrush Respiratory system: Clear  to auscultation. Respiratory effort normal. Cardiovascular system: S1 & S2 heard, regular rate and rhythm Gastrointestinal system: Abdomen soft, non-tender, nondistended. Normal bowel sounds   Central nervous system: Alert and oriented. No focal neurological deficits. Extremities: No cyanosis, clubbing or edema- b/l BKA Skin: No rashes or ulcers Psychiatry:  Mood & affect appropriate.     Data Reviewed: I have personally reviewed following labs and imaging studies  CBC: Recent Labs  Lab 10/21/20 0832 10/25/20 0416 10/25/20 0630  WBC 9.0 10.5 11.5*  HGB 10.4* 12.4* 11.0*  HCT 35.8* 41.6 37.5*  MCV 96.2 95.9 95.4  PLT 549* 373 [id number]    Basic Metabolic Panel: Recent Labs  Lab 10/22/20 0309 10/23/20 0359 10/24/20 0942 10/25/20 0416 10/25/20 0630  NA 133* 131* 133* 135 133*  K 3.8 3.8 3.9 3.6 3.8  CL 93* 89* 95* 94* 95*  CO2 '25 25 24 26 25  '$ GLUCOSE 98 122* 212* 81 113*  BUN 22* 31* 24* 31* 33*  CREATININE 4.28* 5.33* 4.62* 5.36* 5.55*  CALCIUM 8.6* 8.9 8.7* 8.9 8.6*  PHOS 4.5 5.2* 4.8* 4.8* 5.0*    GFR: Estimated Creatinine Clearance: 15.8 mL/min (A) (by C-G formula based on SCr of 5.55 mg/dL (H)). Liver Function Tests: Recent Labs  Lab 10/22/20 0309 10/23/20 0359 10/24/20 0942 10/25/20 0416 10/25/20 0630  ALBUMIN 2.3* 2.3* 2.2* 2.4* 2.2*    No results for input(s): LIPASE, AMYLASE in the last 168 hours. No results for input(s): AMMONIA in the last 168 hours. Coagulation Profile: No results for input(s): INR, PROTIME in the last 168 hours. Cardiac Enzymes: No results for input(s): CKTOTAL, CKMB, CKMBINDEX, TROPONINI in the last 168 hours. BNP (last 3 results) No results for input(s): PROBNP in the last 8760 hours. HbA1C: No results for input(s): HGBA1C in the last 72 hours. CBG: Recent Labs  Lab 10/23/20 1526 10/24/20 1157  GLUCAP 117* 223*   Lipid Profile: No results for input(s): CHOL, HDL, LDLCALC, TRIG, CHOLHDL, LDLDIRECT in the last 72  hours. Thyroid Function Tests: No results for input(s): TSH, T4TOTAL, FREET4, T3FREE, THYROIDAB in the last 72 hours. Anemia Panel: No results for input(s): VITAMINB12, FOLATE, FERRITIN, TIBC, IRON, RETICCTPCT in the last 72 hours. Urine analysis: No results found for: COLORURINE, APPEARANCEUR, LABSPEC, PHURINE, GLUCOSEU, HGBUR, BILIRUBINUR, KETONESUR, PROTEINUR, UROBILINOGEN, NITRITE, LEUKOCYTESUR Sepsis Labs: '@LABRCNTIP'$ (procalcitonin:4,lacticidven:4) )No results found for this or any previous visit (from the past 240 hour(s)).       Radiology Studies: No results found.    Scheduled Meds:  (feeding supplement) PROSource Plus  30 mL Oral  BID BM   vitamin C  1,000 mg Oral Daily   aspirin  325 mg Oral Daily   atorvastatin  10 mg Oral QPM   calcium acetate  1,334 mg Oral TID WC   Chlorhexidine Gluconate Cloth  6 each Topical Q0600   cinacalcet  30 mg Oral Q breakfast   collagenase   Topical Daily   cycloSPORINE  1 drop Both Eyes Daily   heparin  5,000 Units Subcutaneous Q8H   insulin aspart  0-6 Units Subcutaneous TID WC   insulin glargine  5 Units Subcutaneous Daily   lidocaine  1 patch Transdermal Q24H   midodrine       midodrine  10 mg Oral TID WC   nutrition supplement (JUVEN)  1 packet Oral BID BM   pantoprazole  40 mg Oral Daily   paricalcitol  7 mcg Intravenous Q T,Th,Sa-HD   sucroferric oxyhydroxide  500 mg Oral Q breakfast   Continuous Infusions:   LOS: 18 days      Debbe Odea, MD [facility] Pager: [email or web address] 10/25/2020, 4:12 PM

## 2020-10-25 NOTE — Procedures (Signed)
I was present at this dialysis session. I have reviewed the session itself and made appropriate changes.   Vital signs in last 24 hours:  Temp:  [97.8 F (36.6 C)-98.5 F (36.9 C)] 98.5 F (36.9 C) (08/06 [id number]) Pulse Rate:  [91-102] 102 (08/06 0638) Resp:  [17-18] 17 (08/06 [id number]) BP: (110-121)/(70-92) 121/70 (08/06 0638) SpO2:  [90 %-100 %] 90 % (08/06 0638) Weight change:  Filed Weights   10/15/20 0823 10/20/20 2036  Weight: 91 kg 91.1 kg    Recent Labs  Lab 10/24/20 0942  NA 133*  K 3.9  CL 95*  CO2 24  GLUCOSE 212*  BUN 24*  CREATININE 4.62*  CALCIUM 8.7*  PHOS 4.8*    Recent Labs  Lab 10/21/20 0832 10/25/20 0416  WBC 9.0 10.5  HGB 10.4* 12.4*  HCT 35.8* 41.6  MCV 96.2 95.9  PLT 549* 373    Scheduled Meds:  (feeding supplement) PROSource Plus  30 mL Oral BID BM   vitamin C  1,000 mg Oral Daily   aspirin  325 mg Oral Daily   atorvastatin  10 mg Oral QPM   calcium acetate  1,334 mg Oral TID WC   Chlorhexidine Gluconate Cloth  6 each Topical Q0600   cinacalcet  30 mg Oral Q breakfast   collagenase   Topical Daily   cycloSPORINE  1 drop Both Eyes Daily   heparin  5,000 Units Subcutaneous Q8H   insulin aspart  0-6 Units Subcutaneous TID WC   insulin glargine  5 Units Subcutaneous Daily   lidocaine  1 patch Transdermal Q24H   midodrine  10 mg Oral TID WC   nutrition supplement (JUVEN)  1 packet Oral BID BM   pantoprazole  40 mg Oral Daily   paricalcitol  7 mcg Intravenous Q T,Th,Sa-HD   sucroferric oxyhydroxide  500 mg Oral Q breakfast   Continuous Infusions:  sodium chloride     sodium chloride     PRN Meds:.sodium chloride, sodium chloride, acetaminophen **OR** acetaminophen, alteplase, alum & mag hydroxide-simeth, guaiFENesin-dextromethorphan, heparin, lidocaine (PF), lidocaine, lidocaine-prilocaine, menthol-cetylpyridinium, mupirocin ointment, ondansetron, ondansetron, oxyCODONE-acetaminophen **AND** oxyCODONE, pentafluoroprop-tetrafluoroeth, phenol,  polyethylene glycol, polyvinyl alcohol     Dialysis Orders: TTS at [facility] [place]  3h 81mn  450/1.5  90kg  2/2.5 bath AVF  Hep 6000 +500u/hr - Aranesp 1054m IV q Thur - [facility] 49m649mIV q HD   Problem/Plan: B heel wounds/osteomyelitis: SP B BKA Dr. DudSharol Given 7/22.  Awaiting rehab OP placement Volume:bilat pitting hip edema continues improvement with UF on HD, unable to get good weights w/ air bed. Asked nursing staff to pull wound vacs and equipment off bed and re-calibrate bed while pt in chair. New dry wt should be around 84- 85kg.  Could be lower after more UF, [facility]  [facility]: Usual TTS sched via AVF  Hypertension: No antihypertensive meds - on mido -- have increased to max for volume removal.  Anemia: 12.4  8/2- ESA on hold. SP 1 unit prbc's [id number] bone disease: Ca ok, Phos 4.8 Continue binders (Phoslo and velphoro-  heard that he sometimes refuses), also on sensipar and  Vit D  Nutrition: Alb very low - continue supplements.  Sacral wound: Noted during last admit. Per primary.  Hx finger amputations, ischemic  HFrEF (25-30%)  Myoclonic jerking: Discontinued Gabapentin with resolution of Sx. Dispo - plan is for SNF placement , pending-  will maybe need to change HD units upon SNF placement ?  Donetta Potts,  MD 10/25/2020, 7:37 AM

## 2020-10-26 DIAGNOSIS — N186 End stage renal disease: Secondary | ICD-10-CM | POA: Diagnosis not present

## 2020-10-26 DIAGNOSIS — M86679 Other chronic osteomyelitis, unspecified ankle and foot: Secondary | ICD-10-CM | POA: Diagnosis not present

## 2020-10-26 DIAGNOSIS — M86171 Other acute osteomyelitis, right ankle and foot: Secondary | ICD-10-CM | POA: Diagnosis not present

## 2020-10-26 DIAGNOSIS — Z992 Dependence on renal dialysis: Secondary | ICD-10-CM | POA: Diagnosis not present

## 2020-10-26 MED ORDER — TRAMADOL HCL 50 MG PO TABS
50.0000 mg | ORAL_TABLET | Freq: Once | ORAL | Status: AC
  Administered 2020-10-26: 50 mg via ORAL
  Filled 2020-10-26: qty 1

## 2020-10-26 NOTE — Progress Notes (Signed)
FSBS 180at 2325.

## 2020-10-26 NOTE — Progress Notes (Signed)
Patient's FSBS is 181 at 2200.

## 2020-10-26 NOTE — Progress Notes (Signed)
PROGRESS NOTE    [name] [name]   [id number]  DOB: [birth year]  DOA: 10/07/2020 PCP: [name], MD   Brief Narrative:  [name] [name] a 55 year old male with history of insulin-dependent diabetes melitis, ESRD on dialysis, chronic bilateral heel osteomyelitis/necrotic ulcer was admitted for worsening bilateral foot pain.  He was recently admitted for the same but he refused bilateral BKA at that time.  Orthopedics was consulted during this admission and he underwent bilateral BKA.   Subjective: Still having lower back pain but heat packs are helping.    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   Difficult to place patient Yes   Consultants:  nephrology Procedures:   B/l BKA Antimicrobials:  Anti-infectives (From admission, onward)    Start     Dose/Rate Route Frequency Ordered Stop   10/10/20 1951  vancomycin (VANCOCIN) 1-5 GM/200ML-% IVPB       Note to Pharmacy: Obas, Henor[place]   : cabinet override      10/10/20 1951 10/11/20 0759   10/10/20 1915  vancomycin  (VANCOCIN) IVPB 1000 mg/200 mL premix        1,000 mg 200 mL/hr over 60 Minutes Intravenous Every Fri (Hemodialysis) 10/10/20 1902 10/10/20 2100   10/10/20 1902  vancomycin variable dose per unstable renal function (pharmacist dosing)  Status:  Discontinued         Does not apply See admin instructions 10/10/20 1902 10/11/20 1229   10/10/20 1000  ceFAZolin (ANCEF) IVPB 2g/100 mL premix        2 g 200 mL/hr over 30 Minutes Intravenous On call to O.R. 10/10/20 0903 10/10/20 1421   10/09/20 1200  vancomycin (VANCOCIN) IVPB 1000 mg/200 mL premix  Status:  Discontinued        1,000 mg 200 mL/hr over 60 Minutes Intravenous Every T-Th-Sa (Hemodialysis) 10/08/20 0308 10/10/20 1902   10/08/20 1200  vancomycin (VANCOCIN) IVPB 1000 mg/200 mL premix        1,000 mg 200 mL/hr over 60 Minutes Intravenous Every Wed (Hemodialysis) 10/08/20 0829 10/08/20 1133   10/08/20 0600  piperacillin-tazobactam (ZOSYN) IVPB 2.25 g        2.25 g 100 mL/hr over 30 Minutes Intravenous Every 8 hours 10/08/20 0308 10/11/20 2359   10/07/20 1515  piperacillin-tazobactam (ZOSYN) IVPB 3.375 g        3.375 g 100 mL/hr over 30 Minutes Intravenous  Once 10/07/20 1500 10/07/20 1707   10/07/20 1515  vancomycin (VANCOCIN)  2,250 mg in sodium chloride 0.9 % 500 mL IVPB        2,250 mg 250 mL/hr over 120 Minutes Intravenous  Once 10/07/20 1500 10/07/20 1931        Objective: Vitals:   10/25/20 1710 10/25/20 2008 10/26/20 0534 10/26/20 0946  BP: 115/63 115/71 122/70 121/71  Pulse: 98 94 94 98  Resp: '18 17 18 18  '$ Temp: 98.2 F (36.8 C) 98.4 F (36.9 C) 97.8 F (36.6 C) 98.2 F (36.8 C)  TempSrc: Oral  Oral Oral  SpO2: 99% 97% 100% 99%  Height:        Intake/Output Summary (Last 24 hours) at 10/26/2020 1451 Last data filed at 10/26/2020 0800 Gross per 24 hour  Intake 360 ml  Output 0 ml  Net 360 ml    Filed Weights    Examination: General exam: Appears comfortable  HEENT: PERRLA, oral mucosa moist, no sclera  icterus or thrush Respiratory system: Clear to auscultation. Respiratory effort normal. Cardiovascular system: S1 & S2 heard, regular rate and rhythm Gastrointestinal system: Abdomen soft, non-tender, nondistended. Normal bowel sounds   Central nervous system: Alert and oriented. No focal neurological deficits. Extremities: No cyanosis, clubbing or edema- b/l BKA with sleeves on MSK- tender across lower back bilaterally Skin: No rashes or ulcers Psychiatry:  Mood & affect appropriate.     Data Reviewed: I have personally reviewed following labs and imaging studies  CBC: Recent Labs  Lab 10/21/20 0832 10/25/20 0416 10/25/20 0630  WBC 9.0 10.5 11.5*  HGB 10.4* 12.4* 11.0*  HCT 35.8* 41.6 37.5*  MCV 96.2 95.9 95.4  PLT 549* 373 [id number]    Basic Metabolic Panel: Recent Labs  Lab 10/22/20 0309 10/23/20 0359 10/24/20 0942 10/25/20 0416 10/25/20 0630  NA 133* 131* 133* 135 133*  K 3.8 3.8 3.9 3.6 3.8  CL 93* 89* 95* 94* 95*  CO2 '25 25 24 26 25  '$ GLUCOSE 98 122* 212* 81 113*  BUN 22* 31* 24* 31* 33*  CREATININE 4.28* 5.33* 4.62* 5.36* 5.55*  CALCIUM 8.6* 8.9 8.7* 8.9 8.6*  PHOS 4.5 5.2* 4.8* 4.8* 5.0*    GFR: Estimated Creatinine Clearance: 15.8 mL/min (A) (by C-G formula based on SCr of 5.55 mg/dL (H)). Liver Function Tests: Recent Labs  Lab 10/22/20 0309 10/23/20 0359 10/24/20 0942 10/25/20 0416 10/25/20 0630  ALBUMIN 2.3* 2.3* 2.2* 2.4* 2.2*    No results for input(s): LIPASE, AMYLASE in the last 168 hours. No results for input(s): AMMONIA in the last 168 hours. Coagulation Profile: No results for input(s): INR, PROTIME in the last 168 hours. Cardiac Enzymes: No results for input(s): CKTOTAL, CKMB, CKMBINDEX, TROPONINI in the last 168 hours. BNP (last 3 results) No results for input(s): PROBNP in the last 8760 hours. HbA1C: No results for input(s): HGBA1C in the last 72 hours. CBG: Recent Labs  Lab 10/23/20 1526 10/24/20 1157  GLUCAP 117* 223*     Lipid Profile: No results for input(s): CHOL, HDL, LDLCALC, TRIG, CHOLHDL, LDLDIRECT in the last 72 hours. Thyroid Function Tests: No results for input(s): TSH, T4TOTAL, FREET4, T3FREE, THYROIDAB in the last 72 hours. Anemia Panel: No results for input(s): VITAMINB12, FOLATE, FERRITIN, TIBC, IRON, RETICCTPCT in the last 72 hours. Urine analysis: No results found for: COLORURINE, APPEARANCEUR, LABSPEC, PHURINE, GLUCOSEU, HGBUR, BILIRUBINUR, KETONESUR, PROTEINUR, UROBILINOGEN, NITRITE, LEUKOCYTESUR Sepsis Labs: '@LABRCNTIP'$ (procalcitonin:4,lacticidven:4) )No results found for this or any previous visit (from the past 240 hour(s)).       Radiology Studies: No results found.  Scheduled Meds:  (feeding supplement) PROSource Plus  30 mL Oral BID BM   vitamin C  1,000 mg Oral Daily   aspirin  325 mg Oral Daily   atorvastatin  10 mg Oral QPM   calcium acetate  1,334 mg Oral TID WC   Chlorhexidine Gluconate Cloth  6 each Topical Q0600   cinacalcet  30 mg Oral Q breakfast   collagenase   Topical Daily   cycloSPORINE  1 drop Both Eyes Daily   heparin  5,000 Units Subcutaneous Q8H   insulin aspart  0-6 Units Subcutaneous TID WC   insulin glargine  5 Units Subcutaneous Daily   lidocaine  1 patch Transdermal Q24H   midodrine  10 mg Oral TID WC   nutrition supplement (JUVEN)  1 packet Oral BID BM   pantoprazole  40 mg Oral Daily   paricalcitol  7 mcg Intravenous Q T,Th,Sa-HD   sucroferric oxyhydroxide  500 mg Oral Q breakfast   Continuous Infusions:   LOS: 19 days      Debbe Odea, MD [facility] Pager: [email or web address] 10/26/2020, 2:51 PM

## 2020-10-26 NOTE — Plan of Care (Signed)
  Problem: Activity: Goal: Risk for activity intolerance will decrease Outcome: Not Progressing   

## 2020-10-26 NOTE — Progress Notes (Addendum)
Subjective: Said tolerated dialysis yesterday until 2 hours when he had his chronic back pain, 1865 mL UF BP stable  Objective Vital signs in last 24 hours: Vitals:   10/25/20 1710 10/25/20 2008 10/26/20 0534 10/26/20 0946  BP: 115/63 115/71 122/70 121/71  Pulse: 98 94 94 98  Resp: '18 17 18 18  '$ Temp: 98.2 F (36.8 C) 98.4 F (36.9 C) 97.8 F (36.6 C) 98.2 F (36.8 C)  TempSrc: Oral  Oral Oral  SpO2: 99% 97% 100% 99%  Height:       Weight change:  Physical Exam: General: Alert pleasant chronically ill male NAD Heart: RRR no MRG Lungs: CTA nonlabored breathing Abdomen: Bowel sounds positive normoactive soft nontender , some lower abdominal wall abdominal edema, right abdomen dressing dry clear Extremities: Bilateral BKA's with stump protector around noted some edema in upper thighs hips Dialysis Access: Positive bruit right arm AV fistula  Dialysis Orders: TTS at [place]  3h 18mn  450/1.5  90kg  2/2.5 bath AVF  Hep 6000 +500u/hr - Aranesp 10101m IV q Thur - [facility] 49m33mIV q HD   Problem/Plan: B heel wounds/osteomyelitis: SP B BKA Dr. DudSharol Given 7/22.  Awaiting rehab OP placement Volume:bilat pitting hip edema continues improvement with UF on HD, unable to get good weights w/ air bed. New dry wt should be around 84- 85kg.  Could be lower after more [facility]  [facility]: Usual TTS sched via AVF  Hypertension: No antihypertensive meds - on mido -- have increased to max for volume removal.  Anemia: 12.4 > 11.0 8/06- ESA on hold. SP 1 unit prbc's [id number] bone disease: Ca ok, Phos 1.0 continue binders (Phoslo and velphoro-  heard that he sometimes refuses), also on sensipar and  Vit D  Nutrition: Alb 2.2  - continue supplements.  Sacral wound: . Per primary.  Hx finger amputations, ischemic  HFrEF (25-30%)  Myoclonic jerking: Discontinued Gabapentin with resolution of Sx. Dispo - plan is for SNF placement , pending-  will maybe need to  change HD units upon SNF placement ?  DavErnest HaberA-C [facility] Beeper [phone]/2022,10:35 AM  LOS: 19 days   Labs: Basic Metabolic Panel: Recent Labs  Lab 10/24/20 0942 10/25/20 0416 10/25/20 0630  NA 133* 135 133*  K 3.9 3.6 3.8  CL 95* 94* 95*  CO2 '24 26 25  '$ GLUCOSE 212* 81 113*  BUN 24* 31* 33*  CREATININE 4.62* 5.36* 5.55*  CALCIUM 8.7* 8.9 8.6*  PHOS 4.8* 4.8* 5.0*   Liver Function Tests: Recent Labs  Lab 10/24/20 0942 10/25/20 0416 10/25/20 0630  ALBUMIN 2.2* 2.4* 2.2*   No results for input(s): LIPASE, AMYLASE in the last 168 hours. No results for input(s): AMMONIA in the last 168 hours. CBC: Recent Labs  Lab 10/21/20 0832 10/25/20 0416 10/25/20 0630  WBC 9.0 10.5 11.5*  HGB 10.4* 12.4* 11.0*  HCT 35.8* 41.6 37.5*  MCV 96.2 95.9 95.4  PLT 549* 373 380   Cardiac Enzymes: No results for input(s): CKTOTAL, CKMB, CKMBINDEX, TROPONINI in the last 168 hours. CBG: Recent Labs  Lab 10/23/20 1526 10/24/20 1157  GLUCAP 117* 223*    Studies/Results: No results found. Medications:   (feeding supplement) PROSource Plus  30 mL Oral BID BM   vitamin C  1,000 mg Oral Daily   aspirin  325 mg Oral Daily   atorvastatin  10 mg Oral QPM   calcium acetate  1,334 mg  Oral TID WC   Chlorhexidine Gluconate Cloth  6 each Topical Q0600   cinacalcet  30 mg Oral Q breakfast   collagenase   Topical Daily   cycloSPORINE  1 drop Both Eyes Daily   heparin  5,000 Units Subcutaneous Q8H   insulin aspart  0-6 Units Subcutaneous TID WC   insulin glargine  5 Units Subcutaneous Daily   lidocaine  1 patch Transdermal Q24H   midodrine  10 mg Oral TID WC   nutrition supplement (JUVEN)  1 packet Oral BID BM   pantoprazole  40 mg Oral Daily   paricalcitol  7 mcg Intravenous Q T,Th,Sa-HD   sucroferric oxyhydroxide  500 mg Oral Q breakfast   I have seen and examined this patient and agree with plan and assessment in the above note with renal  recommendations/intervention highlighted.  Continues to s/o early due to pain.  Governor Rooks Elyjah Hazan,MD 10/26/2020 3:38 PM

## 2020-10-27 NOTE — Progress Notes (Signed)
Occupational Therapy Treatment Patient Details Name: [name] [name] MRN: [id number] DOB: January 25, 1966 Today's Date: 10/27/2020    History of present illness 55 y/o male with history of IDDM, ESRD who was recently admitted with bilateral heel osteomyelitis and necrotic ulcers. He refused b/l BKA at that time. He was readmitted with worsening bilateral foot pain. He was seen by orthopedics and podiatry who are recommending bilateral bka. Patient is now S/P B BKA.   OT comments  Pt making slow, steady progress with functional goals. Session focused on bed mobility to sit OEB and in prep for ASP transfer, chair transfers, bathing and dressing seated at sink. Pt with increased difficulty with recall of technique for bed mobility to transfer to chair. Pt requiring increased cuing and time for sequencing to scoot for A-P transfer to chair. OT will continue to follow acutely to maximize level of function and safety  Follow Up Recommendations  SNF    Equipment Recommendations  Other (comment);Wheelchair (measurements OT);Wheelchair cushion (measurements OT) (TBD at SNF)    Recommendations for Other Services      Precautions / Restrictions Precautions Precautions: Fall Required Braces or Orthoses: Other Brace Other Brace: B shrinkers Restrictions Weight Bearing Restrictions: Yes RLE Weight Bearing: Non weight bearing LLE Weight Bearing: Non weight bearing       Mobility Bed Mobility Overal bed mobility: Needs Assistance Bed Mobility: Supine to Sit     Supine to sit: HOB elevated;Min guard     General bed mobility comments: min guard for coming to long sitting    Transfers Overall transfer level: Needs assistance   Transfers: Comptroller transfers: +2 physical assistance;Mod assist;Min assist   General transfer comment: mod-minAx2 for pad scoot, mainly due to difficulty sequencing UE for scooting    Balance Overall balance assessment:  Needs assistance Sitting-balance support: Bilateral upper extremity supported;Feet supported Sitting balance-Leahy Scale: Poor Sitting balance - Comments: min guard as bottom slides forward                                   ADL either performed or assessed with clinical judgement   ADL Overall ADL's : Needs assistance/impaired Eating/Feeding: Set up;Sitting   Grooming: Wash/dry hands;Wash/dry face;Set up;Sitting;Supervision/safety Grooming Details (indicate cue type and reason): seated in recliner at sink Upper Body Bathing: Supervision/ safety;Set up;Sitting Upper Body Bathing Details (indicate cue type and reason): seated in recliner at sink Lower Body Bathing: Moderate assistance;Sitting/lateral leans   Upper Body Dressing : Supervision/safety;Set up;Sitting       Toilet Transfer: Moderate assistance;Minimal assistance;+2 for physical assistance Toilet Transfer Details (indicate cue type and reason): simulated by AP transfer to [place] and Hygiene: Moderate assistance;Sitting/lateral lean         General ADL Comments: pt seated in recliner at sink for bathing and dressing tasks     Vision Patient Visual Report: No change from baseline     Perception     Praxis      Cognition Arousal/Alertness: Awake/alert Behavior During Therapy: Flat affect Overall Cognitive Status: Impaired/Different from baseline Area of Impairment: Following commands;Problem solving;Awareness;Memory                     Memory: Decreased short-term memory Following Commands: Follows one step commands with increased time;Follows multi-step commands with increased time;Follows multi-step commands inconsistently Safety/Judgement: Decreased awareness of safety;Decreased awareness of deficits Awareness:  Anticipatory Problem Solving: Slow processing;Requires verbal cues;Requires tactile cues;Decreased initiation;Difficulty sequencing General  Comments: Pt with very little carryover of technique for getting to the chair that was used numerous times last week when pt worked with PT. Pt requires increased cuing and time to process        Exercises Exercises: Other exercises;Amputee Amputee Exercises Knee Flexion: AROM;Both;5 reps Knee Extension: AROM;Both;5 reps Other Exercises Other Exercises: chair pushupx5 Other Exercises: lateral leans each side x5   Shoulder Instructions       General Comments VSS on RA    Pertinent Vitals/ Pain       Pain Assessment: Faces Faces Pain Scale: Hurts even more Pain Location: back lumbosacral region Pain Descriptors / Indicators: Aching;Discomfort;Sore;Sharp Pain Intervention(s): Limited activity within patient's tolerance;Monitored during session;Repositioned;Heat applied  Home Living                                          Prior Functioning/Environment              Frequency  Min 2X/week        Progress Toward Goals  OT Goals(current goals can now be found in the care plan section)  Progress towards OT goals: Progressing toward goals  Acute Rehab OT Goals Patient Stated Goal: to get comfortable  Plan Discharge plan remains appropriate    Co-evaluation    PT/OT/SLP Co-Evaluation/Treatment: Yes Reason for Co-Treatment: Complexity of the patient's impairments (multi-system involvement);For patient/therapist safety;To address functional/ADL transfers PT goals addressed during session: Mobility/safety with mobility OT goals addressed during session: ADL's and self-care      AM-PAC OT "6 Clicks" Daily Activity     Outcome Measure   Help from another person eating meals?: None Help from another person taking care of personal grooming?: A Little Help from another person toileting, which includes using toliet, bedpan, or urinal?: A Lot Help from another person bathing (including washing, rinsing, drying)?: A Lot Help from another person to put  on and taking off regular upper body clothing?: A Little Help from another person to put on and taking off regular lower body clothing?: A Lot 6 Click Score: 16    End of Session    OT Visit Diagnosis: Other abnormalities of gait and mobility (R26.89);Muscle weakness (generalized) (M62.81);Pain Pain - part of body:  (low back, sacrum)   Activity Tolerance Patient limited by fatigue;Patient limited by pain   Patient Left in chair;with call bell/phone within reach;with chair alarm set   Nurse Communication Mobility status (AP transfer back to bed)        Time: 1010-1046 OT Time Calculation (min): 36 min  Charges: OT General Charges $OT Visit: 1 Visit OT Treatments $Self Care/Home Management : 8-22 mins     Britt Bottom 10/27/2020, 1:59 PM

## 2020-10-27 NOTE — TOC Progression Note (Signed)
Transition of Care [facility]) - Progression Note    Patient Details  Name: [name] [name] MRN: [id number] Date of Birth: 1965/09/08  Transition of Care [facility] CM/SW Contact  Sharlet Salina Mila Homer, LCSW Phone Number: 10/27/2020, 6:09 PM  Clinical Narrative:  Talked with Pam with [facility] [phone]) regarding patient being accepted [name] for ST rehab and needing to change his HD Center temporarily from [place] (patient's [place]) in [place] to [place] [name] to [facility] [place]) either TTS around 11:00 am or MWF around 11:20 am.  Pam checked into this and informed CSW that [facility] can offer 11:40 am TTS with patient arriving 15 minutes early. Followed up with [facility] and this schedule would not work. [facility] and Pam eventually talked and Pam informed CSW that she will continue to follow.     Expected Discharge Plan: Skilled Nursing Facility Barriers to Discharge: Ship broker, Continued Medical Work up, SNF Pending bed offer  Expected Discharge Plan and Services Expected Discharge Plan: [place] In-house Referral: Clinical Social Work     Living arrangements for the past 2 months: Single Family Home Expected Discharge Date: 10/15/20                                   Social Determinants of Health (SDOH) Interventions  CSW provided patient with information on [facility] after being informed that he would need HD transportation when he returns home, as his mother cannot physically assist him with transfers.    Readmission Risk Interventions No flowsheet data found.

## 2020-10-27 NOTE — Progress Notes (Signed)
[facility] Progress Note  Subjective: seen in room, no c/o's  Vitals:   10/26/20 0946 10/26/20 1758 10/26/20 2111 10/27/20 0539  BP: 121/71 121/67 120/67 108/66  Pulse: 98 99 98 95  Resp: '18 16 17 17  '$ Temp: 98.2 F (36.8 C) 98.1 F (36.7 C) 98.4 F (36.9 C) 98.4 F (36.9 C)  TempSrc: Oral Oral Oral   SpO2: 99% 100% 99% 99%  Height:        Exam: General: Alert pleasant chronically ill male NAD Heart: RRR no MRG Lungs: CTA nonlabored breathing Abdomen: Bowel sounds positive normoactive soft nontender , abdomen dressing dry Extremities: bilat BKA's w/ stump protector, much improved woody edema in upper thighs/ hips Dialysis Access: RUE AV fistula +bruit   Dialysis: TTS at [facility]  3h 23mn  450/1.5  90kg  2/2.5 bath AVF  Hep 6000 +500u/hr - Aranesp 1047m IV q Thur - [facility] 55m255mIV q HD   Problem/Plan: B heel wounds/osteomyelitis- SP B BKA Dr. DudSharol Given 7/22.  Awaiting SNF Rehab Volume- bilat pitting hip edema continues improvement with UF on HD, unable to get good weights w/ air bed. New dry wt should be around 84- 85kg.  Could be lower after more UF, [facility]  [facility] - HD TTS.  HD tomorrow.   Hypertension: No antihypertensive meds - on midodrine, have increased to max for volume removal.  Anemia: 12.4 > 11.0 8/06- ESA on hold. SP 1 unit prbc's [id number] bone disease: Ca ok, Phos 1.0 continue binders (Phoslo and velphoro-  heard that he sometimes refuses), also on sensipar and  Vit D  Nutrition: Alb 2.2  - continue supplements.  Sacral wound: . Per primary.  Hx finger amputations, ischemic  HFrEF (25-30%)  Myoclonic jerking: Discontinued Gabapentin with resolution of Sx. Dispo - plan is for SNF placement , pending-  will maybe need to change HD units upon SNF placement ?       Rob Jalil Lorusso 10/27/2020, 7:11 AM   Recent Labs  Lab 10/25/20 0416 10/25/20 0630  K 3.6 3.8  BUN 31* 33*  CREATININE 5.36* 5.55*   CALCIUM 8.9 8.6*  PHOS 4.8* 5.0*  HGB 12.4* 11.0*   Inpatient medications:  (feeding supplement) PROSource Plus  30 mL Oral BID BM   vitamin C  1,000 mg Oral Daily   aspirin  325 mg Oral Daily   atorvastatin  10 mg Oral QPM   calcium acetate  1,334 mg Oral TID WC   Chlorhexidine Gluconate Cloth  6 each Topical Q0600   cinacalcet  30 mg Oral Q breakfast   collagenase   Topical Daily   cycloSPORINE  1 drop Both Eyes Daily   heparin  5,000 Units Subcutaneous Q8H   insulin aspart  0-6 Units Subcutaneous TID WC   insulin glargine  5 Units Subcutaneous Daily   lidocaine  1 patch Transdermal Q24H   midodrine  10 mg Oral TID WC   nutrition supplement (JUVEN)  1 packet Oral BID BM   pantoprazole  40 mg Oral Daily   paricalcitol  7 mcg Intravenous Q T,Th,Sa-HD   sucroferric oxyhydroxide  500 mg Oral Q breakfast    acetaminophen **OR** acetaminophen, alum & mag hydroxide-simeth, guaiFENesin-dextromethorphan, lidocaine, menthol-cetylpyridinium, mupirocin ointment, ondansetron, ondansetron, oxyCODONE-acetaminophen **AND** oxyCODONE, phenol, polyethylene glycol, polyvinyl alcohol

## 2020-10-27 NOTE — Plan of Care (Signed)
  Problem: Education: Goal: Knowledge of General Education information will improve Description: Including pain rating scale, medication(s)/side effects and non-pharmacologic comfort measures Outcome: Progressing   Problem: Activity: Goal: Risk for activity intolerance will decrease Outcome: Progressing   Problem: Nutrition: Goal: Adequate nutrition will be maintained Outcome: Progressing   Problem: Pain Managment: Goal: General experience of comfort will improve Outcome: Progressing   Problem: Skin Integrity: Goal: Risk for impaired skin integrity will decrease Outcome: Progressing   Problem: Education: Goal: Knowledge of disease and its progression will improve Outcome: Progressing

## 2020-10-27 NOTE — Progress Notes (Signed)
Physical Therapy Treatment Patient Details Name: [name] [name] MRN: [id number] DOB: January 25, 1966 Today's Date: 10/27/2020    History of Present Illness 55 y/o male with history of IDDM, ESRD who was recently admitted with bilateral heel osteomyelitis and necrotic ulcers. He refused b/l BKA at that time. He was readmitted with worsening bilateral foot pain. He was seen by orthopedics and podiatry who are recommending bilateral bka. Patient is now S/P B BKA.    PT Comments    Pt with increased difficulty with recall of technique for bed mobility to transfer to chair. Pt requiring increased cuing and time for sequencing to scoot for A-P transfer to chair. After  performing self care at the sink with OT. Performed exercise in chair. Encouraged pt to sit up in chair. Pt complains he is uncomfortable, educated on building sitting tolerance. Pt unable to sit with legs down in chair as he slides down in chair to help alleviate back pain and is unable to scoot back. D/c plans remain appropriate at this time. PT will continue to follow acutely.    Follow Up Recommendations  SNF     Equipment Recommendations  None recommended by PT       Precautions / Restrictions Precautions Precautions: Fall Required Braces or Orthoses: Other Brace Other Brace: B shrinkers Restrictions Weight Bearing Restrictions: Yes RLE Weight Bearing: Non weight bearing LLE Weight Bearing: Non weight bearing    Mobility  Bed Mobility Overal bed mobility: Needs Assistance Bed Mobility: Supine to Sit     Supine to sit: HOB elevated;Min guard     General bed mobility comments: min guard for coming to longsetting    Transfers Overall transfer level: Needs assistance   Transfers: Comptroller transfers: +2 physical assistance;Mod assist;Min assist   General transfer comment: mod-minAx2 for pad scoot, mainly due to difficulty sequencing UE for scooting  Ambulation/Gait              General Gait Details: unable          Balance Overall balance assessment: Needs assistance Sitting-balance support: Bilateral upper extremity supported;Feet supported Sitting balance-Leahy Scale: Poor Sitting balance - Comments: min guard as bottom slides forward                                    Cognition Arousal/Alertness: Awake/alert Behavior During Therapy: Flat affect Overall Cognitive Status: Impaired/Different from baseline Area of Impairment: Following commands;Problem solving;Awareness;Memory                     Memory: Decreased short-term memory (difficulty with carry over from prior sessions) Following Commands: Follows one step commands with increased time;Follows multi-step commands with increased time;Follows multi-step commands inconsistently Safety/Judgement: Decreased awareness of safety;Decreased awareness of deficits Awareness: Anticipatory Problem Solving: Slow processing;Requires verbal cues;Requires tactile cues;Decreased initiation;Difficulty sequencing General Comments: Pt with very little carryover of technique for getting to the chair that was used numerous times last week. Pt requires increased cuing and time to process      Exercises Amputee Exercises Knee Flexion: AROM;Both;5 reps Knee Extension: AROM;Both;5 reps Other Exercises Other Exercises: chair pushupx5 Other Exercises: lateral leans each side x5    General Comments General comments (skin integrity, edema, etc.): VSS on RA      Pertinent Vitals/Pain Pain Assessment: Faces Faces Pain Scale: Hurts even more Pain Location: back lumbosacral region Pain Descriptors /  Indicators: Aching;Discomfort;Sore;Sharp Pain Intervention(s): Limited activity within patient's tolerance;Monitored during session;Repositioned;Heat applied     PT Goals (current goals can now be found in the care plan section) Acute Rehab PT Goals Patient Stated Goal: to get  comfortable PT Goal Formulation: With patient Time For Goal Achievement: 10/24/20 Potential to Achieve Goals: Fair    Frequency    Min 3X/week      PT Plan Current plan remains appropriate    Co-evaluation PT/OT/SLP Co-Evaluation/Treatment: Yes Reason for Co-Treatment: Complexity of the patient's impairments (multi-system involvement) PT goals addressed during session: Mobility/safety with mobility OT goals addressed during session: ADL's and self-care      AM-PAC PT "6 Clicks" Mobility   Outcome Measure  Help needed turning from your back to your side while in a flat bed without using bedrails?: A Lot Help needed moving from lying on your back to sitting on the side of a flat bed without using bedrails?: A Lot Help needed moving to and from a bed to a chair (including a wheelchair)?: Total Help needed standing up from a chair using your arms (e.g., wheelchair or bedside chair)?: Total Help needed to walk in hospital room?: Total Help needed climbing 3-5 steps with a railing? : Total 6 Click Score: 8    End of Session   Activity Tolerance: Patient tolerated treatment well Patient left: with call bell/phone within reach;in chair;with chair alarm set Nurse Communication: Mobility status PT Visit Diagnosis: Other abnormalities of gait and mobility (R26.89);Pain Pain - Right/Left:  (bilateral) Pain - part of body: Leg (back)     Time: 1010-1046 PT Time Calculation (min) (ACUTE ONLY): 36 min  Charges:  $Therapeutic Activity: 8-22 mins                     Treyshon Buchanon B. Migdalia Dk PT, DPT Acute Rehabilitation Services Pager [phone] Office [phone]    [place] 10/27/2020, 11:04 AM

## 2020-10-27 NOTE — TOC Progression Note (Signed)
Transition of Care [facility]) - Progression Note    Patient Details  Name: [name] [name] MRN: [id number] Date of Birth: 16-May-1965  Transition of Care [facility]) CM/SW Contact  Sharlet Salina Mila Homer, LCSW Phone Number: 10/27/2020, 3:04 PM  Clinical Narrative: Call made to  Soy, admissions director at [facility] following up with her regarding patient and if they can take him and also transport to dialysis. Soy indicated that they would need patient to go to [facility] dialysis  [address] center around 11 am-TTS or around 11:20-MWF. Patient goes to [place] on [address]  Call mae to [facility] with [facility] [phone]) regarding patient and the need for a temporary change in his HD Center while in rehab at [place] facility. Pam indicated that she will look into and get back with CSW.  Visited with patient and his mother Dexton Athey regarding his going to [facility] and working on getting HD Centers changed temporarily. CSW informed that patient advised needs help with HD transportation once home. Ms. Stapf reported that it has been hard at times transporting him to dialysis due to the help he has needed getting out the vehicle and this was discussed. Patient and mother informed that CSW will look into HD transportation and try to provide them with transportation resources.     Expected Discharge Plan: Skilled Nursing Facility Barriers to Discharge: Ship broker, Continued Medical Work up, SNF Pending bed offer  Expected Discharge Plan and Services Expected Discharge Plan: [place] In-house Referral: Clinical Social Work     Living arrangements for the past 2 months: Single Family Home Expected Discharge Date: 10/15/20                                     Social Determinants of Health (SDOH) Interventions  Patient needs assistance with HD transportation post discharge from [place]  rehab.  Readmission Risk Interventions No flowsheet data found.

## 2020-10-27 NOTE — Consult Note (Signed)
   [facility] CM Inpatient Consult   10/27/2020  [name] [name] [birth year] [id number]  Follow up:  Long length of stay 20  Review of patient's progress notes and recommendations and ongoing discussion with inpatient TOC LCSW regarding barriers to transition with medically stable for transition of care.  Plan:  If patient goes to a [facility] affiliated skilled nursing facility the [facility] RN can follow for transitional needs for returning to community.  Natividad Brood, RN BSN [facility] Liaison  [phone] business mobile phone Toll free office [phone]  Fax number: [phone] [email or web address]

## 2020-10-27 NOTE — Progress Notes (Signed)
PROGRESS NOTE    [name] [name]   [id number]  DOB: 09/12/1965  DOA: 10/07/2020 PCP: [name], MD   Brief Narrative:  [name] [name] a 55 year old male with history of insulin-dependent diabetes melitis, ESRD on dialysis, chronic bilateral heel osteomyelitis/necrotic ulcer was admitted for worsening bilateral foot pain.  He was recently admitted for the same but he refused bilateral BKA at that time.  Orthopedics was consulted during this admission and he underwent bilateral BKA.   Subjective: Lower back pain is improving. No other complaints today.     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   Difficult to place patient Yes   Consultants:  nephrology Procedures:   B/l BKA Antimicrobials:  Anti-infectives (From admission, onward)    Start     Dose/Rate Route Frequency Ordered Stop   10/10/20 1951  vancomycin (VANCOCIN) 1-5 GM/200ML-% IVPB       Note to Pharmacy: [name] [name], [name] [name]   : cabinet override      10/10/20 1951 10/11/20 0759   10/10/20 1915  vancomycin (VANCOCIN) IVPB 1000 mg/200  mL premix        1,000 mg 200 mL/hr over 60 Minutes Intravenous Every Fri (Hemodialysis) 10/10/20 1902 10/10/20 2100   10/10/20 1902  vancomycin variable dose per unstable renal function (pharmacist dosing)  Status:  Discontinued         Does not apply See admin instructions 10/10/20 1902 10/11/20 1229   10/10/20 1000  ceFAZolin (ANCEF) IVPB 2g/100 mL premix        2 g 200 mL/hr over 30 Minutes Intravenous On call to O.R. 10/10/20 0903 10/10/20 1421   10/09/20 1200  vancomycin (VANCOCIN) IVPB 1000 mg/200 mL premix  Status:  Discontinued        1,000 mg 200 mL/hr over 60 Minutes Intravenous Every T-Th-Sa (Hemodialysis) 10/08/20 0308 10/10/20 1902   10/08/20 1200  vancomycin (VANCOCIN) IVPB 1000 mg/200 mL premix        1,000 mg 200 mL/hr over 60 Minutes Intravenous Every Wed (Hemodialysis) 10/08/20 0829 10/08/20 1133   10/08/20 0600  piperacillin-tazobactam (ZOSYN) IVPB 2.25 g        2.25 g 100 mL/hr over 30 Minutes Intravenous Every 8 hours 10/08/20 0308 10/11/20 2359   10/07/20 1515  piperacillin-tazobactam (ZOSYN) IVPB 3.375 g        3.375 g 100 mL/hr over 30 Minutes Intravenous  Once 10/07/20 1500 10/07/20 1707   10/07/20 1515  vancomycin (VANCOCIN) 2,250 mg in sodium  chloride 0.9 % 500 mL IVPB        2,250 mg 250 mL/hr over 120 Minutes Intravenous  Once 10/07/20 1500 10/07/20 1931        Objective: Vitals:   10/26/20 1758 10/26/20 2111 10/27/20 0539 10/27/20 0917  BP: 121/67 120/67 108/66 118/71  Pulse: 99 98 95 95  Resp: '16 17 17 17  '$ Temp: 98.1 F (36.7 C) 98.4 F (36.9 C) 98.4 F (36.9 C) 98.1 F (36.7 C)  TempSrc: Oral Oral  Oral  SpO2: 100% 99% 99% 97%  Height:        Intake/Output Summary (Last 24 hours) at 10/27/2020 1556 Last data filed at 10/27/2020 1230 Gross per 24 hour  Intake 720 ml  Output 0 ml  Net 720 ml    Filed Weights    Examination: General exam: Appears comfortable  HEENT: PERRLA, oral mucosa moist, no sclera icterus or thrush Respiratory  system: Clear to auscultation. Respiratory effort normal. Cardiovascular system: S1 & S2 heard, regular rate and rhythm Gastrointestinal system: Abdomen soft, non-tender, nondistended. Normal bowel sounds   Central nervous system: Alert and oriented. No focal neurological deficits. Extremities: No cyanosis, clubbing or edema- b/l BKA and left finger amputations Skin: No rashes or ulcers Psychiatry:  Mood & affect appropriate.     Data Reviewed: I have personally reviewed following labs and imaging studies  CBC: Recent Labs  Lab 10/21/20 0832 10/25/20 0416 10/25/20 0630  WBC 9.0 10.5 11.5*  HGB 10.4* 12.4* 11.0*  HCT 35.8* 41.6 37.5*  MCV 96.2 95.9 95.4  PLT 549* 373 [id number]    Basic Metabolic Panel: Recent Labs  Lab 10/22/20 0309 10/23/20 0359 10/24/20 0942 10/25/20 0416 10/25/20 0630  NA 133* 131* 133* 135 133*  K 3.8 3.8 3.9 3.6 3.8  CL 93* 89* 95* 94* 95*  CO2 '25 25 24 26 25  '$ GLUCOSE 98 122* 212* 81 113*  BUN 22* 31* 24* 31* 33*  CREATININE 4.28* 5.33* 4.62* 5.36* 5.55*  CALCIUM 8.6* 8.9 8.7* 8.9 8.6*  PHOS 4.5 5.2* 4.8* 4.8* 5.0*    GFR: Estimated Creatinine Clearance: 15.8 mL/min (A) (by C-G formula based on SCr of 5.55 mg/dL (H)). Liver Function Tests: Recent Labs  Lab 10/22/20 0309 10/23/20 0359 10/24/20 0942 10/25/20 0416 10/25/20 0630  ALBUMIN 2.3* 2.3* 2.2* 2.4* 2.2*    No results for input(s): LIPASE, AMYLASE in the last 168 hours. No results for input(s): AMMONIA in the last 168 hours. Coagulation Profile: No results for input(s): INR, PROTIME in the last 168 hours. Cardiac Enzymes: No results for input(s): CKTOTAL, CKMB, CKMBINDEX, TROPONINI in the last 168 hours. BNP (last 3 results) No results for input(s): PROBNP in the last 8760 hours. HbA1C: No results for input(s): HGBA1C in the last 72 hours. CBG: Recent Labs  Lab 10/23/20 1526 10/24/20 1157  GLUCAP 117* 223*    Lipid Profile: No results for input(s): CHOL, HDL, LDLCALC, TRIG,  CHOLHDL, LDLDIRECT in the last 72 hours. Thyroid Function Tests: No results for input(s): TSH, T4TOTAL, FREET4, T3FREE, THYROIDAB in the last 72 hours. Anemia Panel: No results for input(s): VITAMINB12, FOLATE, FERRITIN, TIBC, IRON, RETICCTPCT in the last 72 hours. Urine analysis: No results found for: COLORURINE, APPEARANCEUR, LABSPEC, PHURINE, GLUCOSEU, HGBUR, BILIRUBINUR, KETONESUR, PROTEINUR, UROBILINOGEN, NITRITE, LEUKOCYTESUR Sepsis Labs: '@LABRCNTIP'$ (procalcitonin:4,lacticidven:4) )No results found for this or any previous visit (from the past 240 hour(s)).       Radiology Studies: No results found.    Scheduled Meds:  (feeding supplement) PROSource Plus  30 mL Oral BID BM   vitamin C  1,000 mg Oral Daily   aspirin  325 mg Oral Daily   atorvastatin  10 mg Oral QPM   calcium acetate  1,334 mg Oral TID WC   Chlorhexidine Gluconate Cloth  6 each Topical Q0600   cinacalcet  30 mg Oral Q breakfast   collagenase   Topical Daily   cycloSPORINE  1 drop Both Eyes Daily   heparin  5,000 Units Subcutaneous Q8H   insulin aspart  0-6 Units Subcutaneous TID WC   insulin glargine  5 Units Subcutaneous Daily   lidocaine  1 patch Transdermal Q24H   midodrine  10 mg Oral TID WC   nutrition supplement (JUVEN)  1 packet Oral BID BM   pantoprazole  40 mg Oral Daily   paricalcitol  7 mcg Intravenous Q T,Th,Sa-HD   sucroferric oxyhydroxide  500 mg Oral Q breakfast   Continuous Infusions:   LOS: 20 days      Debbe Odea, MD [facility] Pager: [email or web address] 10/27/2020, 3:56 PM

## 2020-10-27 NOTE — Progress Notes (Signed)
Per Crawford Givens, Social Worker patient will be placed at the [facility] if his clinic can be changed from [facility] to [facility] . Spoke with Chi at [facility]. She is trying to work out a transfer to [facility].

## 2020-10-28 LAB — CBC
HCT: 36.6 % — ABNORMAL LOW (ref 39.0–52.0)
Hemoglobin: 10.8 g/dL — ABNORMAL LOW (ref 13.0–17.0)
MCH: 28.1 pg (ref 26.0–34.0)
MCHC: 29.5 g/dL — ABNORMAL LOW (ref 30.0–36.0)
MCV: 95.1 fL (ref 80.0–100.0)
Platelets: 341 10*3/uL (ref 150–400)
RBC: 3.85 MIL/uL — ABNORMAL LOW (ref 4.22–5.81)
RDW: 16.4 % — ABNORMAL HIGH (ref 11.5–15.5)
WBC: 13.9 10*3/uL — ABNORMAL HIGH (ref 4.0–10.5)
nRBC: 0 % (ref 0.0–0.2)

## 2020-10-28 LAB — RENAL FUNCTION PANEL
Albumin: 2.1 g/dL — ABNORMAL LOW (ref 3.5–5.0)
Anion gap: 14 (ref 5–15)
BUN: 45 mg/dL — ABNORMAL HIGH (ref 6–20)
CO2: 24 mmol/L (ref 22–32)
Calcium: 8.6 mg/dL — ABNORMAL LOW (ref 8.9–10.3)
Chloride: 94 mmol/L — ABNORMAL LOW (ref 98–111)
Creatinine, Ser: 6.78 mg/dL — ABNORMAL HIGH (ref 0.61–1.24)
GFR, Estimated: 9 mL/min — ABNORMAL LOW (ref >60)
Glucose, Bld: 139 mg/dL — ABNORMAL HIGH (ref 70–99)
Phosphorus: 5.1 mg/dL — ABNORMAL HIGH (ref 2.5–4.6)
Potassium: 4.2 mmol/L (ref 3.5–5.1)
Sodium: 132 mmol/L — ABNORMAL LOW (ref 135–145)

## 2020-10-28 MED ORDER — COLLAGENASE 250 UNIT/GM EX OINT
TOPICAL_OINTMENT | Freq: Every day | CUTANEOUS | Status: DC
  Filled 2020-10-28: qty 30

## 2020-10-28 MED ORDER — HEPARIN SODIUM (PORCINE) 1000 UNIT/ML DIALYSIS: 3000.0000 [IU] | INTRAMUSCULAR | Status: DC | PRN

## 2020-10-28 NOTE — Progress Notes (Signed)
PROGRESS NOTE    [name] [name]   [id number]  DOB: 1965/06/24  DOA: 10/07/2020 PCP: [name], MD   Brief Narrative:  [name] Diantonio [name] [name] a 55 year old male with history of insulin-dependent diabetes melitis, ESRD on dialysis, chronic bilateral heel osteomyelitis/necrotic ulcer was admitted for worsening bilateral foot pain.  He was recently admitted for the same but he refused bilateral BKA at that time.  Orthopedics was consulted during this admission and he underwent bilateral BKA.  He has subsequently been awaiting a SNF bed.    Subjective: As long as he is using the heating pain, his lower back pain is controlled. He is otherwise doing well, eating, having normal BMs.     Assessment & Plan:   Principal Problem:   Acute Osteomyelitis of foot, bilateral - s/p bilateral BKA 10/10/20 - if SNF bed is not obtained by Thursday, please ask Dr Sharol Given to look at stumps in follow up  Active Problems:  Lower back pain - cont heating pad   Pressure ulcers on lower back - wound care on 7/20 - recommended Santyl, Collagenase, saline dressing covered with dry gauze dressing, then silicone foam to secure - As they appear to be progressing, I will ask WOC to consult again    ESRD on hemodialysis ([name] [name]) Acute on Chronic systolic CHF AOCD - 1 U PRBC given on 7/27 - EF 35-30% - fluid management with dialysis - appreciate renal managing   Myoclonic jerks - resolved after Gabapentin d/c'd  Left finger amputations    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   Difficult to place patient Yes   Consultants:  nephrology Procedures:   B/l BKA Antimicrobials:  Anti-infectives (From admission, onward)    Start     Dose/Rate Route Frequency Ordered Stop   10/10/20 1951  vancomycin (VANCOCIN) 1-5 GM/200ML-% IVPB       Note to Pharmacy: Obas, Henorck   : cabinet override      10/10/20 1951 10/11/20 0759   10/10/20 1915  vancomycin (VANCOCIN) IVPB 1000 mg/200 mL premix        1,000 mg 200 mL/hr over 60 Minutes Intravenous Every Fri (Hemodialysis) 10/10/20 1902 10/10/20 2100   10/10/20 1902  vancomycin variable dose per unstable renal function (pharmacist dosing)  Status:  Discontinued         Does not apply See admin instructions 10/10/20 1902 10/11/20 1229   10/10/20 1000  ceFAZolin (ANCEF) IVPB 2g/100 mL premix        2 g 200 mL/hr over 30 Minutes Intravenous On call to O.R. 10/10/20 0903 10/10/20 1421   10/09/20 1200  vancomycin (VANCOCIN) IVPB 1000 mg/200 mL premix  Status:  Discontinued        1,000 mg 200 mL/hr over 60 Minutes Intravenous Every T-Th-Sa (Hemodialysis) 10/08/20 0308 10/10/20 1902   10/08/20  1200  vancomycin (VANCOCIN) IVPB 1000 mg/200 mL premix        1,000 mg 200 mL/hr over 60 Minutes Intravenous Every Wed (Hemodialysis) 10/08/20 0829 10/08/20 1133   10/08/20 0600  piperacillin-tazobactam (ZOSYN) IVPB 2.25 g        2.25 g 100 mL/hr over 30 Minutes Intravenous Every 8 hours 10/08/20 0308 10/11/20 2359   10/07/20 1515  piperacillin-tazobactam (ZOSYN) IVPB 3.375 g        3.375 g 100 mL/hr over 30 Minutes Intravenous  Once 10/07/20 1500 10/07/20 1707   10/07/20 1515  vancomycin (VANCOCIN) 2,250 mg in sodium chloride 0.9 % 500 mL IVPB        2,250 mg 250 mL/hr over 120 Minutes Intravenous  Once 10/07/20 1500 10/07/20 1931        Objective: Vitals:   10/27/20 2033 10/28/20 0523  10/28/20 0908 10/28/20 1100  BP: 109/73 120/63 119/76   Pulse: 100 97 98   Resp: '20 20 18   '$ Temp: 97.6 F (36.4 C) 97.6 F (36.4 C) 97.7 F (36.5 C)   TempSrc: Oral Oral Oral   SpO2: 100% 100% 100%   Weight:    66.3 kg  Height:        Intake/Output Summary (Last 24 hours) at 10/28/2020 1431 Last data filed at 10/28/2020 0837 Gross per 24 hour  Intake 540 ml  Output 0 ml  Net 540 ml    Filed Weights   10/28/20 1100  Weight: 66.3 kg    Examination: General exam: Appears comfortable  HEENT: PERRLA, oral mucosa moist, no sclera icterus or thrush Respiratory system: Clear to auscultation. Respiratory effort normal. Cardiovascular system: S1 & S2 heard, regular rate and rhythm Gastrointestinal system: Abdomen soft, non-tender, nondistended. Normal bowel sounds   Central nervous system: Alert and oriented. No focal neurological deficits. Extremities: No cyanosis, clubbing or edema- b/l BKA with sleeves Skin: ulcers on lower back    Psychiatry:  Mood & affect appropriate.     Data Reviewed: I have personally reviewed following labs and imaging studies  CBC: Recent Labs  Lab 10/25/20 0416 10/25/20 0630  WBC 10.5 11.5*  HGB 12.4* 11.0*  HCT 41.6 37.5*  MCV 95.9 95.4  PLT 373 [id number]    Basic Metabolic Panel: Recent Labs  Lab 10/22/20 0309 10/23/20 0359 10/24/20 0942 10/25/20 0416 10/25/20 0630  NA 133* 131* 133* 135 133*  K 3.8 3.8 3.9 3.6 3.8  CL 93* 89* 95* 94* 95*  CO2 '25 25 24 26 25  '$ GLUCOSE 98 122* 212* 81 113*  BUN 22* 31* 24* 31* 33*  CREATININE 4.28* 5.33* 4.62* 5.36* 5.55*  CALCIUM 8.6* 8.9 8.7* 8.9 8.6*  PHOS 4.5 5.2* 4.8* 4.8* 5.0*    GFR: Estimated Creatinine Clearance: 13.2 mL/min (A) (by C-G formula based on SCr of 5.55 mg/dL (H)). Liver Function Tests: Recent Labs  Lab 10/22/20 0309 10/23/20 0359 10/24/20 0942 10/25/20 0416 10/25/20 0630  ALBUMIN 2.3* 2.3* 2.2* 2.4* 2.2*    No results for input(s): LIPASE, AMYLASE in the last 168  hours. No results for input(s): AMMONIA in the last 168 hours. Coagulation Profile: No results for input(s): INR, PROTIME in the last 168 hours. Cardiac Enzymes: No results for input(s): CKTOTAL, CKMB, CKMBINDEX, TROPONINI in the last 168 hours. BNP (last 3 results) No results for input(s): PROBNP in the last 8760 hours. HbA1C: No results for input(s): HGBA1C in the last 72 hours. CBG: Recent Labs  Lab 10/23/20 1526 10/24/20 1157  GLUCAP 117*  223*    Lipid Profile: No results for input(s): CHOL, HDL, LDLCALC, TRIG, CHOLHDL, LDLDIRECT in the last 72 hours. Thyroid Function Tests: No results for input(s): TSH, T4TOTAL, FREET4, T3FREE, THYROIDAB in the last 72 hours. Anemia Panel: No results for input(s): VITAMINB12, FOLATE, FERRITIN, TIBC, IRON, RETICCTPCT in the last 72 hours. Urine analysis: No results found for: COLORURINE, APPEARANCEUR, LABSPEC, PHURINE, GLUCOSEU, HGBUR, BILIRUBINUR, KETONESUR, PROTEINUR, UROBILINOGEN, NITRITE, LEUKOCYTESUR Sepsis Labs: '@LABRCNTIP'$ (procalcitonin:4,lacticidven:4) )No results found for this or any previous visit (from the past 240 hour(s)).       Radiology Studies: No results found.    Scheduled Meds:  (feeding supplement) PROSource Plus  30 mL Oral BID BM   vitamin C  1,000 mg Oral Daily   aspirin  325 mg Oral Daily   atorvastatin  10 mg Oral QPM   calcium acetate  1,334 mg Oral TID WC   Chlorhexidine Gluconate Cloth  6 each Topical Q0600   cinacalcet  30 mg Oral Q breakfast   collagenase   Topical Daily   cycloSPORINE  1 drop Both Eyes Daily   heparin  5,000 Units Subcutaneous Q8H   insulin aspart  0-6 Units Subcutaneous TID WC   insulin glargine  5 Units Subcutaneous Daily   lidocaine  1 patch Transdermal Q24H   midodrine  10 mg Oral TID WC   nutrition supplement (JUVEN)  1 packet Oral BID BM   pantoprazole  40 mg Oral Daily   paricalcitol  7 mcg Intravenous Q T,Th,Sa-HD   sucroferric oxyhydroxide  500 mg Oral Q breakfast    Continuous Infusions:   LOS: 21 days      Debbe Odea, MD [facility] Pager: [email or web address] 10/28/2020, 2:31 PM

## 2020-10-28 NOTE — Plan of Care (Signed)
  Problem: Clinical Measurements: Goal: Ability to maintain clinical measurements within normal limits will improve Outcome: Progressing   Problem: Activity: Goal: Risk for activity intolerance will decrease Outcome: Progressing   Problem: Pain Managment: Goal: General experience of comfort will improve Outcome: Progressing   Problem: Skin Integrity: Goal: Risk for impaired skin integrity will decrease Outcome: Progressing

## 2020-10-28 NOTE — Progress Notes (Signed)
Made arrangements for patient at [facility] 11:45 with arrival time of 11:20. Patient discharging to [name] tomorrow.

## 2020-10-28 NOTE — TOC Progression Note (Addendum)
Transition of Care [facility]) - Progression Note    Patient Details  Name: [name] [name] MRN: [id number] Date of Birth: August 24, 1965  [facility] of [facility]) CM/SW Contact  Sharlet Salina Mila Homer, LCSW Phone Number: 10/28/2020, 3:16 PM  Clinical Narrative:  Received a call from Althia Forts with [facility] regarding patient's adjusted HD schedule while in rehab:   [facility] [address] Time 11:45 am; arrival time - 20 minutes early; check-in time 11:25 am. Patient can arrive at 11:00  CSW talked with patient and his mother at bedside an he is fine with HD schedule and getting their early and having to wait. Soy, admissions director at [facility] called and provided her with HD schedule information and that patient is fine with getting to the HD center early and having to wait. Soy advised that they can take patient and she will call patient's mother regarding doing the admissions paperwork. Call made to Ms. Belenda Cruise and update provided regarding her son going to [facility]. She was informed that Soy would be in touch with her regarding doing the admissions paperwork. When asked, Ms. Contino responded that her son has his COVID vaccination card.     Expected Discharge Plan: Skilled Nursing Facility Barriers to Discharge: Ship broker, Continued Medical Work up, SNF Pending bed offer  Expected Discharge Plan and Services Expected Discharge Plan: [place] In-house Referral: Clinical Social Work     Living arrangements for the past 2 months: Single Family Home Expected Discharge Date: 10/15/20 - New date 10/29/20.                                     Social Determinants of Health (SDOH) Interventions    Readmission Risk Interventions No flowsheet data found.

## 2020-10-28 NOTE — Progress Notes (Signed)
[facility] Progress Note  Subjective: seen in room, no c/o's  Vitals:   10/27/20 1719 10/27/20 2033 10/28/20 0523 10/28/20 0908  BP: 113/68 109/73 120/63 119/76  Pulse: 97 100 97 98  Resp: '18 20 20 18  '$ Temp: 97.7 F (36.5 C) 97.6 F (36.4 C) 97.6 F (36.4 C) 97.7 F (36.5 C)  TempSrc: Oral Oral Oral Oral  SpO2: 99% 100% 100% 100%  Height:        Exam: General: Alert pleasant chronically ill male NAD Heart: RRR no MRG Lungs: CTA nonlabored breathing Abdomen: Bowel sounds positive normoactive soft nontender , abdomen dressing dry Extremities: bilat BKA's w/ stump protector, much improved woody edema in upper thighs/ hips Dialysis Access: RUE AV fistula +bruit   Dialysis: TTS at [facility]  3h 24mn  450/1.5  90kg  2/2.5 bath AVF  Hep 6000 +500u/hr - Aranesp 1012m IV q Thur - [facility] 79m63mIV q HD   Problem/Plan: B heel wounds/osteomyelitis- SP bilat BKA by Dr. DudSharol Given 7/22. SNF pend Volume- excess hip edema mostly resolved w/ HD. Unable to get good weights w/ air bed. Hoyer wt today is off 30kg and must be inaccurate as well. Cont to UF 1-2 L. BP's good.   ESRD - HD TTS.  HD today  Hypertension: No antihypertensive meds - on midodrine 10 tid started here  Anemia: 12.4 > 11.0 8/06- ESA on hold. SP 1 unit prbc's [id number]/123XX123etabolic bone disease: Ca ok, Phos 1.0 continue binders (Phoslo and velphoro-  heard that he sometimes refuses), also on sensipar and  Vit D  Nutrition: Alb 2.2  - continue supplements.  Sacral wound: . Per primary.  Hx finger amputations, ischemic  HFrEF (25-30%)  Myoclonic jerking: Discontinued Gabapentin with resolution of Sx. Dispo - plan is for SNF placement , pending-  may need to change days or shift time but shouldn't need to change HD units.        Rob Rafeal Skibicki 10/28/2020, 10:51 AM   Recent Labs  Lab 10/25/20 0416 10/25/20 0630  K 3.6 3.8  BUN 31* 33*  CREATININE 5.36* 5.55*  CALCIUM 8.9 8.6*  PHOS 4.8* 5.0*   HGB 12.4* 11.0*    Inpatient medications:  (feeding supplement) PROSource Plus  30 mL Oral BID BM   vitamin C  1,000 mg Oral Daily   aspirin  325 mg Oral Daily   atorvastatin  10 mg Oral QPM   calcium acetate  1,334 mg Oral TID WC   Chlorhexidine Gluconate Cloth  6 each Topical Q0600   cinacalcet  30 mg Oral Q breakfast   collagenase   Topical Daily   cycloSPORINE  1 drop Both Eyes Daily   heparin  5,000 Units Subcutaneous Q8H   insulin aspart  0-6 Units Subcutaneous TID WC   insulin glargine  5 Units Subcutaneous Daily   lidocaine  1 patch Transdermal Q24H   midodrine  10 mg Oral TID WC   nutrition supplement (JUVEN)  1 packet Oral BID BM   pantoprazole  40 mg Oral Daily   paricalcitol  7 mcg Intravenous Q T,Th,Sa-HD   sucroferric oxyhydroxide  500 mg Oral Q breakfast    acetaminophen **OR** acetaminophen, alum & mag hydroxide-simeth, guaiFENesin-dextromethorphan, lidocaine, menthol-cetylpyridinium, mupirocin ointment, ondansetron, ondansetron, oxyCODONE-acetaminophen **AND** oxyCODONE, phenol, polyethylene glycol, polyvinyl alcohol

## 2020-10-29 LAB — RESP PANEL BY RT-PCR (FLU A&B, COVID) ARPGX2
Influenza A by PCR: NEGATIVE
Influenza B by PCR: NEGATIVE
SARS Coronavirus 2 by RT PCR: NEGATIVE

## 2020-10-29 MED ORDER — MIDODRINE HCL 5 MG PO TABS: 10.0000 mg | ORAL_TABLET | ORAL | Status: AC

## 2020-10-29 MED ORDER — OXYCODONE-ACETAMINOPHEN 10-325 MG PO TABS: 1.0000 | ORAL_TABLET | Freq: Four times a day (QID) | ORAL | 0 refills | Status: AC | PRN

## 2020-10-29 MED ORDER — COVID-19 MRNA VAC-TRIS(PFIZER) 30 MCG/0.3ML IM SUSP
0.3000 mL | Freq: Once | INTRAMUSCULAR | Status: AC
  Administered 2020-10-29: 0.3 mL via INTRAMUSCULAR
  Filled 2020-10-29: qty 0.3

## 2020-10-29 MED ORDER — SANTYL 250 UNIT/GM EX OINT: 1.0000 "application " | TOPICAL_OINTMENT | Freq: Every day | CUTANEOUS | Status: AC

## 2020-10-29 NOTE — TOC Transition Note (Signed)
Transition of Care [facility]) - CM/SW Discharge Note   Patient Details  Name: [name] [name] MRN: [id number] Date of Birth: [birth year]  [facility]) CM/SW Contact:  [name], [place] Phone Number: 10/29/2020, 2:05 PM   Clinical Narrative:     [facility] will [facility] to: [name] Anticipated DC date: 10/29/20 Family notified: [name], [name] (Mother)  [phone] [facility]) Transport by: [name]   Per MD patient ready for DC to [name]. RN, patient, patient's family, and facility notified of DC. Discharge Summary and FL2 sent to facility. RN to call report prior to discharge [phone] Room 704 ). DC packet on chart. Ambulance transport requested for patient.   CSW will sign off for now as social work intervention is no longer needed. Please consult [place] again if new needs arise.    Final next level of care: Skilled Nursing Facility Barriers to Discharge: No Barriers Identified   Patient Goals and CMS Choice Patient states their goals for this hospitalization and ongoing recovery are:: To be ablut to walk CMS Medicare.gov Compare Post Acute Care list provided to:: Patient Choice offered to / list presented to : Patient  Discharge Placement              Patient chooses bed at: [name] Patient to be transferred to facility by: [place] Name of family member notified: [name], [name] (Mother)   [phone] [facility]) Patient and family notified of of transfer: 10/29/20  Discharge Plan and Services In-house Referral: Clinical Social Work                                   Social Determinants of Health ([place]) Interventions     Readmission Risk Interventions No flowsheet data found.

## 2020-10-29 NOTE — Progress Notes (Signed)
[name] [name]  Subjective: seen in room, no c/o's  Vitals:   10/28/20 1832 10/28/20 2005 10/29/20 0521 10/29/20 0841  BP: 118/74 121/73 131/73 113/63  Pulse: (!) 103 (!) 105 97 (!) 104  Resp:  '20 18 19  '$ Temp:  97.7 F (36.5 C) 97.8 F (36.6 C) 98.2 F (36.8 C)  TempSrc:  Oral Oral   SpO2:  100% 98% 94%  Weight:      Height:        Exam: General: Alert pleasant chronically ill male NAD Heart: RRR no MRG Lungs: CTA nonlabored breathing Abdomen: Bowel sounds positive normoactive soft nontender , abdomen dressing dry Extremities: bilat BKA's w/ stump protector, much improved woody edema in upper thighs/ hips Dialysis Access: RUE AV fistula +bruit   Dialysis: TTS at [facility]  3h 98mn  450/1.5  90kg  2/2.5 bath AVF  Hep 6000 +500u/hr - Aranesp 1052m IV q Thur - [facility]ar 8m65mIV q HD   Problem/Plan: B heel wounds/osteomyelitis- SP bilat BKA by Dr. DudSharol Given 7/22. SNF pend Volume- excess hip edema mostly resolved w/ HD. Unable to get good weights w/ air bed. Hoyer wt today is off 30kg and must be inaccurate as well. BP's and exam are good.   ESRD - HD TTS.  For dc today. [facility] and [facility] schedule w/ 11:20am arrival time.   Hypertension: No antihypertensive meds - on midodrine 10 tid started here  Anemia: 12.4 > 11.0 8/06- ESA on hold. SP 1 unit prbc's [id number] bone disease: Ca ok, Phos 4- 5 continue binders (Phoslo and velphoro-  heard that he sometimes refuses), also on sensipar and  Vit D  Nutrition: Alb 2.2  - continue supplements.  Sacral wound: . Per primary.  Hx finger amputations, ischemic  HFrEF (25-30%)  Myoclonic jerking: Discontinued Gabapentin with resolution of Sx. Dispo - dc to SNF today      [name] [name] 10/29/2020, 11:45 AM   Recent Labs  Lab 10/25/20 0630 10/28/20 1401 10/28/20 1402  K 3.8  --  4.2  BUN 33*  --  45*  CREATININE 5.55*  --   6.78*  CALCIUM 8.6*  --  8.6*  PHOS 5.0*  --  5.1*  HGB 11.0* 10.8*  --     Inpatient medications:  (feeding supplement) PROSource Plus  30 mL Oral BID BM   vitamin C  1,000 mg Oral Daily   aspirin  325 mg Oral Daily   atorvastatin  10 mg Oral QPM   calcium acetate  1,334 mg Oral TID WC   Chlorhexidine Gluconate Cloth  6 each Topical Q0600   cinacalcet  30 mg Oral Q breakfast   collagenase   Topical Daily   cycloSPORINE  1 drop Both Eyes Daily   heparin  5,000 Units Subcutaneous Q8H   insulin aspart  0-6 Units Subcutaneous TID WC   insulin glargine  5 Units Subcutaneous Daily   lidocaine  1 patch Transdermal Q24H   midodrine  10 mg Oral TID WC   nutrition supplement (JUVEN)  1 packet Oral BID BM   pantoprazole  40 mg Oral Daily   paricalcitol  7 mcg Intravenous Q T,Th,Sa-HD   sucroferric oxyhydroxide  500 mg Oral Q breakfast    acetaminophen **OR** acetaminophen, alum & mag hydroxide-simeth, guaiFENesin-dextromethorphan, lidocaine, menthol-cetylpyridinium, mupirocin ointment, ondansetron, ondansetron, oxyCODONE-acetaminophen **AND** oxyCODONE, phenol, polyethylene glycol, polyvinyl alcohol

## 2020-10-29 NOTE — Consult Note (Signed)
WOC Nurse Consult Note: Patient receiving care in [facility] 5M21. Reason for Consult: Reassessment of back wounds Wound type: unclear to me at this time. Considering his ESRD, I question if we are dealing with calciphylaxis. Also, I question if perhaps this has anything to do with end-of-life skin failure. Pressure Injury POA: Yes/No/NA Measurement: see flowsheet section Wound bed: large scattered areas that are dry and darkened, or dry and red/yellow. Drainage (amount, consistency, odor)  Periwound: intact, but not "normal" appearing skin Dressing procedure/placement/frequency: I have modified the existing santyl order to reflect the need to use a "nickel thick" layer of santyl on the wound beds.  Monitor the wound area(s) for worsening of condition such as: Signs/symptoms of infection,  Increase in size,  Development of or worsening of odor, Development of pain, or increased pain at the affected locations.  Notify the medical team if any of these develop.  Thank you for the consult.  Discussed plan of care with the bedside nurse.  [place] nurse will not follow at this time.  Please re-consult the [place] team if needed.  Val Riles, RN, MSN, CWOCN, CNS-BC, pager [phone]

## 2020-10-29 NOTE — Progress Notes (Signed)
Attempted to call report to receiving facility x 2. Will continue efforts.

## 2020-10-29 NOTE — Progress Notes (Signed)
Attempted to call facility [name] again x2 to give report on patient. No answer.

## 2020-10-29 NOTE — Progress Notes (Signed)
Patient's Mom took patient's belongings home including his own glucometer.

## 2020-10-29 NOTE — Progress Notes (Signed)
Attempted to call facility to give report x2. No answer,left message to call back. Will try again later. 2041 Attempted to call again to give report but no answer.

## 2020-10-29 NOTE — Discharge Summary (Signed)
Physician Discharge Summary  WELLMAN VIRGILIO [id number] DOB: May 12, 1965 DOA: 10/07/2020  PCP: [name], MD  Admit date: 10/07/2020 Discharge date: 10/29/2020  Admitted From: Home Disposition: SNF  Discharge Condition:Stable CODE STATUS:FULL Diet recommendation: Heart Healthy/carb modified  Brief/Interim Summary: 55 year old male with history of insulin-dependent diabetes melitis, ESRD on dialysis, chronic bilateral heel osteomyelitis/necrotic ulcer was admitted for worsening bilateral foot pain.  He was recently admitted for the same but he refused bilateral BKA at that time.  Orthopedics was consulted during this admission and he underwent bilateral BKA.  PT subsequently recommended SNF placement.  He is currently medically stable for discharge.  Nephrology is following for dialysis.  He will be discharged to SNF once bed is available.   Discharge diagnosis and hospital course  Bilateral heel osteomyelitis/abscess/necrotic ulcers: Presented with worsening pain.  Was recommended to have bilateral BKA on last admission but refused.  Started on broad-spectrum antibiotics on admission.  Orthopedics consulted and he underwent bilateral BKA, wound VAC on both stumps. -Subsequently wound vacs have been removed.  Outpatient follow-up with orthopedics/Dr. Sharol Given   ESRD on dialysis:  -Nephrology following for dialysis.  Continue outpatient follow-up with dialysis unit as scheduled   insulin-dependent diabetes type 2:  He is chronically on insulin pump.continue on discharge.   Chronic normocytic anemia: Secondary to ESRD.  Currently hemoglobin stable.  He was transfused with a unit of PRBC preoperatively in anticipation for surgery.   Acute on chronic systolic congestive heart failure: Last known EF of 25 to 30%.  Volume management as per dialysis.  Pressure ulcers on lower back, coccyx, bilateral buttocks and posterior right proximal thigh: Unstageable: Present on  admission -Continue wound care as per wound care RN recommendations  Myoclonic jerks -Resolved after discontinuation of gabapentin  Discharge Instructions  Discharge Instructions     Amb Referral to Palliative Care   Complete by: As directed    Goals of care   Diet - low sodium heart healthy   Complete by: As directed    Diet - low sodium heart healthy   Complete by: As directed    Diet Carb Modified   Complete by: As directed    Discharge instructions   Complete by: As directed    1)Please follow up with orthopedics in a week.  Name and number of the provider has been attached   Discharge wound care:   Complete by: As directed    As per wound care RN recommendations:  Wound care to back and buttock areas of full and partial thickness skin loss:  Cleanse with NS, pat gently dry. Apply collagenase (santyl) in a 1/8 in layer to lesions, top with saline moistened gauze dressing, then cover with dry gauze and top with silicone foam. Change daily, may reuse foam for up to 3 days.  Change PRN for rolling of dressing edges or soiling.   Increase activity slowly   Complete by: As directed    Increase activity slowly   Complete by: As directed       Allergies as of 10/29/2020   No Known Allergies      Medication List     STOP taking these medications    doxycycline 100 MG tablet Commonly known as: ADOXA   gabapentin 300 MG capsule Commonly known as: NEURONTIN   metroNIDAZOLE 500 MG tablet Commonly known as: Flagyl   traMADol 50 MG tablet Commonly known as: ULTRAM       TAKE these medications    aspirin 325 MG  tablet Take 325 mg by mouth daily.   atorvastatin 10 MG tablet Commonly known as: LIPITOR Take 10 mg by mouth every evening.   calcium acetate 667 MG capsule Commonly known as: PHOSLO Take 1,334 mg by mouth 3 (three) times daily with meals.   Cequa 0.09 % Soln Generic drug: cycloSPORINE (PF) Place 1 drop into both eyes daily.   cinacalcet 30 MG  tablet Commonly known as: SENSIPAR Take 30 mg by mouth daily.   FreeStyle [employer] 14 Day Sensor Misc See admin instructions.   insulin lispro 100 UNIT/ML injection Commonly known as: HUMALOG Inject into the skin See admin instructions. PUMP   lidocaine 5 % ointment Commonly known as: XYLOCAINE Apply 1 application topically 2 (two) times daily as needed (finger pain).   midodrine 5 MG tablet Commonly known as: PROAMATINE Take 2 tablets (10 mg total) by mouth every Tuesday, Thursday, and Saturday at 6 PM. Start taking on: October 30, 2020 What changed: how much to take   multivitamin with minerals Tabs tablet Take 1 tablet by mouth daily in the afternoon.   mupirocin ointment 2 % Commonly known as: BACTROBAN Apply 1 application topically daily as needed (itching).   Omnipod DASH Pods (Gen 4) Misc Inject into the skin. Use with Humalog PUMP   ondansetron 4 MG tablet Commonly known as: ZOFRAN Take 4 mg by mouth 3 (three) times daily as needed for nausea or vomiting.   oxyCODONE-acetaminophen 10-325 MG tablet Commonly known as: PERCOCET Take 1 tablet by mouth every 6 (six) hours as needed for pain. What changed:  when to take this reasons to take this   Santyl ointment Generic drug: collagenase Apply 1 application topically daily. For 21 days Apply to Unstageable pressure injuries to thoracic and coccyx areas in a nickel thick layer. Top with saline moistened gauze, dry gauze and abd pad or foam dressing What changed: additional instructions   Systane 0.4-0.3 % Soln Generic drug: Polyethyl Glycol-Propyl Glycol Place 1 drop into both eyes daily as needed (dry eyes).   Velphoro 500 MG chewable tablet Generic drug: sucroferric oxyhydroxide Chew 500 mg by mouth daily.   vitamin B-12 500 MCG tablet Commonly known as: CYANOCOBALAMIN Take 500 mcg by mouth daily.               Durable Medical Equipment  (From admission, onward)           Start     Ordered    10/08/20 0740  For home use only DME Air overlay mattress  Once        10/08/20 0739              Discharge Care Instructions  (From admission, onward)           Start     Ordered   10/29/20 0000  Discharge wound care:       Comments: As per wound care RN recommendations:  Wound care to back and buttock areas of full and partial thickness skin loss:  Cleanse with NS, pat gently dry. Apply collagenase (santyl) in a 1/8 in layer to lesions, top with saline moistened gauze dressing, then cover with dry gauze and top with silicone foam. Change daily, may reuse foam for up to 3 days.  Change PRN for rolling of dressing edges or soiling.   10/29/20 0933            Follow-up Information     Suzan Slick, NP Follow up in 1 week(s).  Specialty: Orthopedic Surgery Contact information: [place] [address] [phone]         [name], MD. Schedule an appointment as soon as possible for a visit in 1 week(s).   Specialty: Pulmonary Disease Contact information: [place] [address] [phone]         [name], MD .   Specialty: Cardiology Contact information: [phone] [place] [address] [phone]                No Known Allergies  Consultations: Nephrology/orthopedics   Procedures/Studies: DG Chest 1 View  Result Date: 10/07/2020 CLINICAL DATA:  Congestive heart failure. Shortness of breath. Chest pain. having surgery for a lower leg amputation tomorrow due to infections in his foot. Hx of htn. EXAM: CHEST  1 VIEW COMPARISON:  Chest x-ray 09/23/2020, CT chest 11/26/2018 FINDINGS: Enlarged cardiac silhouette. The heart size and mediastinal contours are unchanged. Bibasilar atelectasis. No focal consolidation. No pulmonary edema. No pleural effusion. No pneumothorax. No acute osseous abnormality. IMPRESSION: Bibasilar atelectasis with superimposed  infection/inflammation not excluded. Electronically Signed   By: Iven Finn M.D.   On: 10/07/2020 18:48   DG Foot Complete Right  Result Date: 10/07/2020 CLINICAL DATA:  Wound on heel for 6 months, gangrene EXAM: RIGHT FOOT COMPLETE - 3+ VIEW COMPARISON:  09/24/2020 FINDINGS: Frontal, oblique, and lateral views of the right foot are obtained. There is a large soft tissue defect overlying the plantar aspect of the right calcaneus with underlying bony changes consistent with acute osteomyelitis. Interval development of extensive subcutaneous gas throughout the plantar soft tissues which could reflect interval debridement or infection with gas-forming organism. Progressive soft tissue swelling of the remainder of the right foot. There is extensive periarticular osteopenia and diffuse osteoarthritis. Extensive vascular calcifications are noted. IMPRESSION: 1. Continued plantar soft tissue defect with underlying calcaneal osteomyelitis. Interval development of extensive subcutaneous gas could reflect interval debridement or infection with gas-forming organism. 2. Progressive soft tissue swelling of the right forefoot and midfoot. 3. Diffuse osteoarthritis and osteopenia. Electronically Signed   By: Randa Ngo M.D.   On: 10/07/2020 15:43      Subjective: Patient seen and examined at bedside this morning.  Poor historian.  No overnight fever, vomiting reported.  Complains of back pain.  Discharge Exam: Vitals:   10/29/20 0521 10/29/20 0841  BP: 131/73 113/63  Pulse: 97 (!) 104  Resp: 18 19  Temp: 97.8 F (36.6 C) 98.2 F (36.8 C)  SpO2: 98% 94%   Vitals:   10/28/20 1832 10/28/20 2005 10/29/20 0521 10/29/20 0841  BP: 118/74 121/73 131/73 113/63  Pulse: (!) 103 (!) 105 97 (!) 104  Resp:  '20 18 19  '$ Temp:  97.7 F (36.5 C) 97.8 F (36.6 C) 98.2 F (36.8 C)  TempSrc:  Oral Oral   SpO2:  100% 98% 94%  Weight:      Height:        General: Looks chronically ill and deconditioned.  Poor  historian.  Awake.  Slow to respond.   Cardiovascular: Intermittently tachycardic, S1-S2 positive Respiratory: Bilateral decreased breath sounds at bases with scattered crackles  abdominal: Soft nontender, bowel sounds present  extremities: Bilateral BKA present.   The results of significant diagnostics from this hospitalization (including imaging, microbiology, ancillary and laboratory) are listed below for reference.     Microbiology: No results found for this or any previous visit (from the past 240 hour(s)).  Labs: BNP (last 3 results) Recent Labs    07/21/20 1234 09/23/20 2015  BNP 4,425.6* >4,500.0*    Basic Metabolic Panel: Recent Labs  Lab 10/23/20 0359 10/24/20 0942 10/25/20 0416 10/25/20 0630 10/28/20 1402  NA 131* 133* 135 133* 132*  K 3.8 3.9 3.6 3.8 4.2  CL 89* 95* 94* 95* 94*  CO2 '25 24 26 25 24  '$ GLUCOSE 122* 212* 81 113* 139*  BUN 31* 24* 31* 33* 45*  CREATININE 5.33* 4.62* 5.36* 5.55* 6.78*  CALCIUM 8.9 8.7* 8.9 8.6* 8.6*  PHOS 5.2* 4.8* 4.8* 5.0* 5.1*    Liver Function Tests: Recent Labs  Lab 10/23/20 0359 10/24/20 0942 10/25/20 0416 10/25/20 0630 10/28/20 1402  ALBUMIN 2.3* 2.2* 2.4* 2.2* 2.1*    No results for input(s): LIPASE, AMYLASE in the last 168 hours. No results for input(s): AMMONIA in the last 168 hours.  CBC: Recent Labs  Lab 10/25/20 0416 10/25/20 0630 10/28/20 1401  WBC 10.5 11.5* 13.9*  HGB 12.4* 11.0* 10.8*  HCT 41.6 37.5* 36.6*  MCV 95.9 95.4 95.1  PLT 373 380 341    Cardiac Enzymes: No results for input(s): CKTOTAL, CKMB, CKMBINDEX, TROPONINI in the last 168 hours. BNP: Invalid input(s): POCBNP CBG: Recent Labs  Lab 10/23/20 1526 10/24/20 1157  GLUCAP 117* 223*    D-Dimer No results for input(s): DDIMER in the last 72 hours. Hgb A1c No results for input(s): HGBA1C in the last 72 hours. Lipid Profile No results for input(s): CHOL, HDL, LDLCALC, TRIG, CHOLHDL, LDLDIRECT in the last 72  hours. Thyroid function studies No results for input(s): TSH, T4TOTAL, T3FREE, THYROIDAB in the last 72 hours.  Invalid input(s): FREET3 Anemia work up No results for input(s): VITAMINB12, FOLATE, FERRITIN, TIBC, IRON, RETICCTPCT in the last 72 hours. Urinalysis No results found for: COLORURINE, APPEARANCEUR, LABSPEC, [place], GLUCOSEU, HGBUR, BILIRUBINUR, KETONESUR, PROTEINUR, UROBILINOGEN, NITRITE, LEUKOCYTESUR Sepsis Labs Invalid input(s): PROCALCITONIN,  WBC,  LACTICIDVEN Microbiology No results found for this or any previous visit (from the past 240 hour(s)).   Please note: You were cared for by a hospitalist during your hospital stay. Once you are discharged, your primary care physician will handle any further medical issues. Please note that NO REFILLS for any discharge medications will be authorized once you are discharged, as it is imperative that you return to your primary care physician (or establish a relationship with a primary care physician if you do not have one) for your post hospital discharge needs so that they can reassess your need for medications and monitor your lab values.    Time coordinating discharge: 40 minutes  SIGNED:   Aline August, MD  [facility] 10/29/2020, 9:38 AM Pager [phone]  If 7PM-7AM, please contact night-coverage [email or web address] Password [email or web address]

## 2020-10-29 NOTE — Progress Notes (Signed)
Attempted to call facility again to give report but no answer,left message to call this RN.

## 2020-10-30 DIAGNOSIS — R42 Dizziness and giddiness: Secondary | ICD-10-CM | POA: Diagnosis not present

## 2020-10-30 DIAGNOSIS — L89893 Pressure ulcer of other site, stage 3: Secondary | ICD-10-CM | POA: Diagnosis present

## 2020-10-30 DIAGNOSIS — L89143 Pressure ulcer of left lower back, stage 3: Secondary | ICD-10-CM | POA: Diagnosis present

## 2020-10-30 DIAGNOSIS — E1122 Type 2 diabetes mellitus with diabetic chronic kidney disease: Secondary | ICD-10-CM | POA: Diagnosis present

## 2020-10-30 DIAGNOSIS — M869 Osteomyelitis, unspecified: Secondary | ICD-10-CM | POA: Diagnosis not present

## 2020-10-30 DIAGNOSIS — E119 Type 2 diabetes mellitus without complications: Secondary | ICD-10-CM | POA: Diagnosis not present

## 2020-10-30 DIAGNOSIS — I340 Nonrheumatic mitral (valve) insufficiency: Secondary | ICD-10-CM | POA: Diagnosis present

## 2020-10-30 DIAGNOSIS — Z515 Encounter for palliative care: Secondary | ICD-10-CM | POA: Diagnosis not present

## 2020-10-30 DIAGNOSIS — E871 Hypo-osmolality and hyponatremia: Secondary | ICD-10-CM | POA: Diagnosis present

## 2020-10-30 DIAGNOSIS — I5022 Chronic systolic (congestive) heart failure: Secondary | ICD-10-CM | POA: Diagnosis present

## 2020-10-30 DIAGNOSIS — A419 Sepsis, unspecified organism: Secondary | ICD-10-CM | POA: Diagnosis present

## 2020-10-30 DIAGNOSIS — R69 Illness, unspecified: Secondary | ICD-10-CM | POA: Diagnosis not present

## 2020-10-30 DIAGNOSIS — Z20822 Contact with and (suspected) exposure to covid-19: Secondary | ICD-10-CM | POA: Diagnosis present

## 2020-10-30 DIAGNOSIS — L89223 Pressure ulcer of left hip, stage 3: Secondary | ICD-10-CM | POA: Diagnosis present

## 2020-10-30 DIAGNOSIS — Z794 Long term (current) use of insulin: Secondary | ICD-10-CM | POA: Diagnosis not present

## 2020-10-30 DIAGNOSIS — R0902 Hypoxemia: Secondary | ICD-10-CM | POA: Diagnosis not present

## 2020-10-30 DIAGNOSIS — L89310 Pressure ulcer of right buttock, unstageable: Secondary | ICD-10-CM | POA: Diagnosis present

## 2020-10-30 DIAGNOSIS — M6281 Muscle weakness (generalized): Secondary | ICD-10-CM | POA: Diagnosis not present

## 2020-10-30 DIAGNOSIS — Z992 Dependence on renal dialysis: Secondary | ICD-10-CM | POA: Diagnosis not present

## 2020-10-30 DIAGNOSIS — L89150 Pressure ulcer of sacral region, unstageable: Secondary | ICD-10-CM | POA: Diagnosis present

## 2020-10-30 DIAGNOSIS — L89154 Pressure ulcer of sacral region, stage 4: Secondary | ICD-10-CM | POA: Diagnosis not present

## 2020-10-30 DIAGNOSIS — Z66 Do not resuscitate: Secondary | ICD-10-CM | POA: Diagnosis present

## 2020-10-30 DIAGNOSIS — K7200 Acute and subacute hepatic failure without coma: Secondary | ICD-10-CM | POA: Diagnosis present

## 2020-10-30 DIAGNOSIS — R6521 Severe sepsis with septic shock: Secondary | ICD-10-CM | POA: Diagnosis present

## 2020-10-30 DIAGNOSIS — I517 Cardiomegaly: Secondary | ICD-10-CM | POA: Diagnosis not present

## 2020-10-30 DIAGNOSIS — L89320 Pressure ulcer of left buttock, unstageable: Secondary | ICD-10-CM | POA: Diagnosis present

## 2020-10-30 DIAGNOSIS — E1159 Type 2 diabetes mellitus with other circulatory complications: Secondary | ICD-10-CM | POA: Diagnosis not present

## 2020-10-30 DIAGNOSIS — N186 End stage renal disease: Secondary | ICD-10-CM | POA: Diagnosis not present

## 2020-10-30 DIAGNOSIS — N2581 Secondary hyperparathyroidism of renal origin: Secondary | ICD-10-CM | POA: Diagnosis not present

## 2020-10-30 DIAGNOSIS — L89130 Pressure ulcer of right lower back, unstageable: Secondary | ICD-10-CM | POA: Diagnosis not present

## 2020-10-30 DIAGNOSIS — G928 Other toxic encephalopathy: Secondary | ICD-10-CM | POA: Diagnosis present

## 2020-10-30 DIAGNOSIS — I959 Hypotension, unspecified: Secondary | ICD-10-CM | POA: Diagnosis not present

## 2020-10-30 DIAGNOSIS — R000 Tachycardia, unspecified: Secondary | ICD-10-CM | POA: Diagnosis not present

## 2020-10-30 DIAGNOSIS — R531 Weakness: Secondary | ICD-10-CM | POA: Diagnosis not present

## 2020-10-30 DIAGNOSIS — R1084 Generalized abdominal pain: Secondary | ICD-10-CM | POA: Diagnosis not present

## 2020-10-30 DIAGNOSIS — Z4781 Encounter for orthopedic aftercare following surgical amputation: Secondary | ICD-10-CM | POA: Diagnosis not present

## 2020-10-30 DIAGNOSIS — E1151 Type 2 diabetes mellitus with diabetic peripheral angiopathy without gangrene: Secondary | ICD-10-CM | POA: Diagnosis present

## 2020-10-30 DIAGNOSIS — I4891 Unspecified atrial fibrillation: Secondary | ICD-10-CM | POA: Diagnosis present

## 2020-10-30 DIAGNOSIS — G4489 Other headache syndrome: Secondary | ICD-10-CM | POA: Diagnosis not present

## 2020-10-30 DIAGNOSIS — G9341 Metabolic encephalopathy: Secondary | ICD-10-CM | POA: Diagnosis not present

## 2020-10-30 DIAGNOSIS — Z743 Need for continuous supervision: Secondary | ICD-10-CM | POA: Diagnosis not present

## 2020-10-30 DIAGNOSIS — L89109 Pressure ulcer of unspecified part of back, unspecified stage: Secondary | ICD-10-CM | POA: Diagnosis not present

## 2020-10-30 DIAGNOSIS — R5381 Other malaise: Secondary | ICD-10-CM | POA: Diagnosis not present

## 2020-10-30 DIAGNOSIS — R278 Other lack of coordination: Secondary | ICD-10-CM | POA: Diagnosis not present

## 2020-10-30 DIAGNOSIS — I9589 Other hypotension: Secondary | ICD-10-CM | POA: Diagnosis present

## 2020-10-30 DIAGNOSIS — Z89512 Acquired absence of left leg below knee: Secondary | ICD-10-CM | POA: Diagnosis not present

## 2020-10-30 DIAGNOSIS — Z89511 Acquired absence of right leg below knee: Secondary | ICD-10-CM | POA: Diagnosis not present

## 2020-10-30 DIAGNOSIS — E872 Acidosis: Secondary | ICD-10-CM | POA: Diagnosis present

## 2020-10-30 DIAGNOSIS — L89899 Pressure ulcer of other site, unspecified stage: Secondary | ICD-10-CM | POA: Diagnosis not present

## 2020-10-30 DIAGNOSIS — I509 Heart failure, unspecified: Secondary | ICD-10-CM | POA: Diagnosis not present

## 2020-10-30 DIAGNOSIS — L89132 Pressure ulcer of right lower back, stage 2: Secondary | ICD-10-CM | POA: Diagnosis not present

## 2020-10-30 DIAGNOSIS — R4182 Altered mental status, unspecified: Secondary | ICD-10-CM | POA: Diagnosis not present

## 2020-10-30 DIAGNOSIS — D631 Anemia in chronic kidney disease: Secondary | ICD-10-CM | POA: Diagnosis not present

## 2020-10-30 DIAGNOSIS — E785 Hyperlipidemia, unspecified: Secondary | ICD-10-CM | POA: Diagnosis present

## 2020-10-30 DIAGNOSIS — I428 Other cardiomyopathies: Secondary | ICD-10-CM | POA: Diagnosis present

## 2020-10-30 DIAGNOSIS — L89159 Pressure ulcer of sacral region, unspecified stage: Secondary | ICD-10-CM | POA: Diagnosis not present

## 2020-10-30 NOTE — Progress Notes (Signed)
Patient discharged to [facility] via [place]. Cell phone sent with patient.

## 2020-10-31 DIAGNOSIS — N186 End stage renal disease: Secondary | ICD-10-CM | POA: Diagnosis not present

## 2020-10-31 DIAGNOSIS — E1159 Type 2 diabetes mellitus with other circulatory complications: Secondary | ICD-10-CM | POA: Diagnosis not present

## 2020-10-31 DIAGNOSIS — I509 Heart failure, unspecified: Secondary | ICD-10-CM | POA: Diagnosis not present

## 2020-10-31 DIAGNOSIS — L89154 Pressure ulcer of sacral region, stage 4: Secondary | ICD-10-CM | POA: Diagnosis not present

## 2020-11-01 DIAGNOSIS — N186 End stage renal disease: Secondary | ICD-10-CM | POA: Diagnosis not present

## 2020-11-01 DIAGNOSIS — N2581 Secondary hyperparathyroidism of renal origin: Secondary | ICD-10-CM | POA: Diagnosis not present

## 2020-11-01 DIAGNOSIS — D631 Anemia in chronic kidney disease: Secondary | ICD-10-CM | POA: Diagnosis not present

## 2020-11-03 DIAGNOSIS — L89150 Pressure ulcer of sacral region, unstageable: Secondary | ICD-10-CM | POA: Diagnosis not present

## 2020-11-03 DIAGNOSIS — L89132 Pressure ulcer of right lower back, stage 2: Secondary | ICD-10-CM | POA: Diagnosis not present

## 2020-11-03 DIAGNOSIS — L89130 Pressure ulcer of right lower back, unstageable: Secondary | ICD-10-CM | POA: Diagnosis not present

## 2020-11-04 DIAGNOSIS — I4891 Unspecified atrial fibrillation: Secondary | ICD-10-CM | POA: Diagnosis present

## 2020-11-04 DIAGNOSIS — R4182 Altered mental status, unspecified: Secondary | ICD-10-CM | POA: Diagnosis not present

## 2020-11-04 DIAGNOSIS — Z451 Encounter for adjustment and management of infusion pump: Secondary | ICD-10-CM | POA: Diagnosis not present

## 2020-11-04 DIAGNOSIS — I5023 Acute on chronic systolic (congestive) heart failure: Secondary | ICD-10-CM | POA: Diagnosis not present

## 2020-11-04 DIAGNOSIS — I4510 Unspecified right bundle-branch block: Secondary | ICD-10-CM | POA: Diagnosis not present

## 2020-11-04 DIAGNOSIS — Z20822 Contact with and (suspected) exposure to covid-19: Secondary | ICD-10-CM | POA: Diagnosis not present

## 2020-11-04 DIAGNOSIS — I428 Other cardiomyopathies: Secondary | ICD-10-CM | POA: Diagnosis present

## 2020-11-04 DIAGNOSIS — L89130 Pressure ulcer of right lower back, unstageable: Secondary | ICD-10-CM | POA: Diagnosis not present

## 2020-11-04 DIAGNOSIS — I998 Other disorder of circulatory system: Secondary | ICD-10-CM | POA: Diagnosis not present

## 2020-11-04 DIAGNOSIS — Z89612 Acquired absence of left leg above knee: Secondary | ICD-10-CM | POA: Diagnosis not present

## 2020-11-04 DIAGNOSIS — I509 Heart failure, unspecified: Secondary | ICD-10-CM | POA: Diagnosis not present

## 2020-11-04 DIAGNOSIS — N2581 Secondary hyperparathyroidism of renal origin: Secondary | ICD-10-CM | POA: Diagnosis not present

## 2020-11-04 DIAGNOSIS — G928 Other toxic encephalopathy: Secondary | ICD-10-CM | POA: Diagnosis present

## 2020-11-04 DIAGNOSIS — D649 Anemia, unspecified: Secondary | ICD-10-CM | POA: Diagnosis not present

## 2020-11-04 DIAGNOSIS — Z743 Need for continuous supervision: Secondary | ICD-10-CM | POA: Diagnosis not present

## 2020-11-04 DIAGNOSIS — I739 Peripheral vascular disease, unspecified: Secondary | ICD-10-CM | POA: Diagnosis not present

## 2020-11-04 DIAGNOSIS — L89109 Pressure ulcer of unspecified part of back, unspecified stage: Secondary | ICD-10-CM | POA: Diagnosis not present

## 2020-11-04 DIAGNOSIS — E871 Hypo-osmolality and hyponatremia: Secondary | ICD-10-CM | POA: Diagnosis present

## 2020-11-04 DIAGNOSIS — R410 Disorientation, unspecified: Secondary | ICD-10-CM | POA: Diagnosis not present

## 2020-11-04 DIAGNOSIS — Z89029 Acquired absence of unspecified finger(s): Secondary | ICD-10-CM | POA: Diagnosis not present

## 2020-11-04 DIAGNOSIS — Z89512 Acquired absence of left leg below knee: Secondary | ICD-10-CM | POA: Diagnosis not present

## 2020-11-04 DIAGNOSIS — Z6824 Body mass index (BMI) 24.0-24.9, adult: Secondary | ICD-10-CM | POA: Diagnosis not present

## 2020-11-04 DIAGNOSIS — E878 Other disorders of electrolyte and fluid balance, not elsewhere classified: Secondary | ICD-10-CM | POA: Diagnosis not present

## 2020-11-04 DIAGNOSIS — L89110 Pressure ulcer of right upper back, unstageable: Secondary | ICD-10-CM | POA: Diagnosis not present

## 2020-11-04 DIAGNOSIS — R1084 Generalized abdominal pain: Secondary | ICD-10-CM | POA: Diagnosis not present

## 2020-11-04 DIAGNOSIS — Z89611 Acquired absence of right leg above knee: Secondary | ICD-10-CM | POA: Diagnosis not present

## 2020-11-04 DIAGNOSIS — G9341 Metabolic encephalopathy: Secondary | ICD-10-CM | POA: Diagnosis not present

## 2020-11-04 DIAGNOSIS — Z515 Encounter for palliative care: Secondary | ICD-10-CM | POA: Diagnosis not present

## 2020-11-04 DIAGNOSIS — K7200 Acute and subacute hepatic failure without coma: Secondary | ICD-10-CM | POA: Diagnosis not present

## 2020-11-04 DIAGNOSIS — L89223 Pressure ulcer of left hip, stage 3: Secondary | ICD-10-CM | POA: Diagnosis not present

## 2020-11-04 DIAGNOSIS — R6521 Severe sepsis with septic shock: Secondary | ICD-10-CM | POA: Diagnosis not present

## 2020-11-04 DIAGNOSIS — R42 Dizziness and giddiness: Secondary | ICD-10-CM | POA: Diagnosis not present

## 2020-11-04 DIAGNOSIS — T8612 Kidney transplant failure: Secondary | ICD-10-CM | POA: Diagnosis not present

## 2020-11-04 DIAGNOSIS — I5022 Chronic systolic (congestive) heart failure: Secondary | ICD-10-CM | POA: Diagnosis present

## 2020-11-04 DIAGNOSIS — Z89511 Acquired absence of right leg below knee: Secondary | ICD-10-CM | POA: Diagnosis not present

## 2020-11-04 DIAGNOSIS — E872 Acidosis: Secondary | ICD-10-CM | POA: Diagnosis present

## 2020-11-04 DIAGNOSIS — L89899 Pressure ulcer of other site, unspecified stage: Secondary | ICD-10-CM | POA: Diagnosis not present

## 2020-11-04 DIAGNOSIS — I471 Supraventricular tachycardia: Secondary | ICD-10-CM | POA: Diagnosis not present

## 2020-11-04 DIAGNOSIS — I517 Cardiomegaly: Secondary | ICD-10-CM | POA: Diagnosis not present

## 2020-11-04 DIAGNOSIS — E1122 Type 2 diabetes mellitus with diabetic chronic kidney disease: Secondary | ICD-10-CM | POA: Diagnosis present

## 2020-11-04 DIAGNOSIS — R7989 Other specified abnormal findings of blood chemistry: Secondary | ICD-10-CM | POA: Diagnosis not present

## 2020-11-04 DIAGNOSIS — Z66 Do not resuscitate: Secondary | ICD-10-CM | POA: Diagnosis not present

## 2020-11-04 DIAGNOSIS — D72829 Elevated white blood cell count, unspecified: Secondary | ICD-10-CM | POA: Diagnosis not present

## 2020-11-04 DIAGNOSIS — E440 Moderate protein-calorie malnutrition: Secondary | ICD-10-CM | POA: Diagnosis not present

## 2020-11-04 DIAGNOSIS — E1151 Type 2 diabetes mellitus with diabetic peripheral angiopathy without gangrene: Secondary | ICD-10-CM | POA: Diagnosis present

## 2020-11-04 DIAGNOSIS — L89320 Pressure ulcer of left buttock, unstageable: Secondary | ICD-10-CM | POA: Diagnosis present

## 2020-11-04 DIAGNOSIS — I10 Essential (primary) hypertension: Secondary | ICD-10-CM | POA: Diagnosis not present

## 2020-11-04 DIAGNOSIS — I9589 Other hypotension: Secondary | ICD-10-CM | POA: Diagnosis present

## 2020-11-04 DIAGNOSIS — L89893 Pressure ulcer of other site, stage 3: Secondary | ICD-10-CM | POA: Diagnosis present

## 2020-11-04 DIAGNOSIS — R5381 Other malaise: Secondary | ICD-10-CM | POA: Diagnosis not present

## 2020-11-04 DIAGNOSIS — L89140 Pressure ulcer of left lower back, unstageable: Secondary | ICD-10-CM | POA: Diagnosis not present

## 2020-11-04 DIAGNOSIS — H04129 Dry eye syndrome of unspecified lacrimal gland: Secondary | ICD-10-CM | POA: Diagnosis not present

## 2020-11-04 DIAGNOSIS — L89100 Pressure ulcer of unspecified part of back, unstageable: Secondary | ICD-10-CM | POA: Diagnosis not present

## 2020-11-04 DIAGNOSIS — Z992 Dependence on renal dialysis: Secondary | ICD-10-CM | POA: Diagnosis not present

## 2020-11-04 DIAGNOSIS — I5042 Chronic combined systolic (congestive) and diastolic (congestive) heart failure: Secondary | ICD-10-CM | POA: Diagnosis not present

## 2020-11-04 DIAGNOSIS — E11622 Type 2 diabetes mellitus with other skin ulcer: Secondary | ICD-10-CM | POA: Diagnosis not present

## 2020-11-04 DIAGNOSIS — R531 Weakness: Secondary | ICD-10-CM | POA: Diagnosis not present

## 2020-11-04 DIAGNOSIS — L89143 Pressure ulcer of left lower back, stage 3: Secondary | ICD-10-CM | POA: Diagnosis not present

## 2020-11-04 DIAGNOSIS — G4489 Other headache syndrome: Secondary | ICD-10-CM | POA: Diagnosis not present

## 2020-11-04 DIAGNOSIS — L8995 Pressure ulcer of unspecified site, unstageable: Secondary | ICD-10-CM | POA: Diagnosis not present

## 2020-11-04 DIAGNOSIS — A419 Sepsis, unspecified organism: Secondary | ICD-10-CM | POA: Diagnosis not present

## 2020-11-04 DIAGNOSIS — Z452 Encounter for adjustment and management of vascular access device: Secondary | ICD-10-CM | POA: Diagnosis not present

## 2020-11-04 DIAGNOSIS — E669 Obesity, unspecified: Secondary | ICD-10-CM | POA: Diagnosis not present

## 2020-11-04 DIAGNOSIS — E785 Hyperlipidemia, unspecified: Secondary | ICD-10-CM | POA: Diagnosis present

## 2020-11-04 DIAGNOSIS — I340 Nonrheumatic mitral (valve) insufficiency: Secondary | ICD-10-CM | POA: Diagnosis present

## 2020-11-04 DIAGNOSIS — N186 End stage renal disease: Secondary | ICD-10-CM | POA: Diagnosis not present

## 2020-11-04 DIAGNOSIS — R627 Adult failure to thrive: Secondary | ICD-10-CM | POA: Diagnosis not present

## 2020-11-04 DIAGNOSIS — I959 Hypotension, unspecified: Secondary | ICD-10-CM | POA: Diagnosis not present

## 2020-11-04 DIAGNOSIS — R000 Tachycardia, unspecified: Secondary | ICD-10-CM | POA: Diagnosis not present

## 2020-11-04 DIAGNOSIS — Z7401 Bed confinement status: Secondary | ICD-10-CM | POA: Diagnosis not present

## 2020-11-04 DIAGNOSIS — L89159 Pressure ulcer of sacral region, unspecified stage: Secondary | ICD-10-CM | POA: Diagnosis not present

## 2020-11-04 DIAGNOSIS — L89150 Pressure ulcer of sacral region, unstageable: Secondary | ICD-10-CM | POA: Diagnosis present

## 2020-11-04 DIAGNOSIS — R001 Bradycardia, unspecified: Secondary | ICD-10-CM | POA: Diagnosis not present

## 2020-11-04 DIAGNOSIS — D631 Anemia in chronic kidney disease: Secondary | ICD-10-CM | POA: Diagnosis not present

## 2020-11-04 DIAGNOSIS — L89120 Pressure ulcer of left upper back, unstageable: Secondary | ICD-10-CM | POA: Diagnosis not present

## 2020-11-04 DIAGNOSIS — L89310 Pressure ulcer of right buttock, unstageable: Secondary | ICD-10-CM | POA: Diagnosis present

## 2020-11-04 DIAGNOSIS — Z794 Long term (current) use of insulin: Secondary | ICD-10-CM | POA: Diagnosis not present

## 2020-11-04 DIAGNOSIS — R69 Illness, unspecified: Secondary | ICD-10-CM | POA: Diagnosis not present

## 2020-11-06 DIAGNOSIS — L89120 Pressure ulcer of left upper back, unstageable: Secondary | ICD-10-CM | POA: Diagnosis not present

## 2020-11-06 DIAGNOSIS — E11622 Type 2 diabetes mellitus with other skin ulcer: Secondary | ICD-10-CM | POA: Diagnosis not present

## 2020-11-06 DIAGNOSIS — L89140 Pressure ulcer of left lower back, unstageable: Secondary | ICD-10-CM | POA: Diagnosis not present

## 2020-11-06 DIAGNOSIS — A419 Sepsis, unspecified organism: Secondary | ICD-10-CM | POA: Diagnosis not present

## 2020-11-06 DIAGNOSIS — I4891 Unspecified atrial fibrillation: Secondary | ICD-10-CM | POA: Diagnosis not present

## 2020-11-06 DIAGNOSIS — I5023 Acute on chronic systolic (congestive) heart failure: Secondary | ICD-10-CM | POA: Diagnosis not present

## 2020-11-06 DIAGNOSIS — E878 Other disorders of electrolyte and fluid balance, not elsewhere classified: Secondary | ICD-10-CM | POA: Diagnosis not present

## 2020-11-06 DIAGNOSIS — Z794 Long term (current) use of insulin: Secondary | ICD-10-CM | POA: Diagnosis not present

## 2020-11-06 DIAGNOSIS — G928 Other toxic encephalopathy: Secondary | ICD-10-CM | POA: Diagnosis not present

## 2020-11-06 DIAGNOSIS — L89130 Pressure ulcer of right lower back, unstageable: Secondary | ICD-10-CM | POA: Diagnosis not present

## 2020-11-06 DIAGNOSIS — L89110 Pressure ulcer of right upper back, unstageable: Secondary | ICD-10-CM | POA: Diagnosis not present

## 2020-11-19 DIAGNOSIS — N186 End stage renal disease: Secondary | ICD-10-CM | POA: Diagnosis not present

## 2020-11-19 DIAGNOSIS — Z992 Dependence on renal dialysis: Secondary | ICD-10-CM | POA: Diagnosis not present

## 2020-11-20 DEATH — deceased

## 2020-12-08 ENCOUNTER — Ambulatory Visit (INDEPENDENT_AMBULATORY_CARE_PROVIDER_SITE_OTHER): Payer: PRIVATE HEALTH INSURANCE | Admitting: Podiatry

## 2020-12-08 DIAGNOSIS — Z992 Dependence on renal dialysis: Secondary | ICD-10-CM

## 2020-12-08 DIAGNOSIS — N186 End stage renal disease: Secondary | ICD-10-CM

## 2020-12-08 DIAGNOSIS — E1122 Type 2 diabetes mellitus with diabetic chronic kidney disease: Secondary | ICD-10-CM

## 2020-12-08 DIAGNOSIS — Z794 Long term (current) use of insulin: Secondary | ICD-10-CM

## 2020-12-08 NOTE — Progress Notes (Signed)
No show for Podiatry appt today. Per chart review, patient hospitalized in August. Sent to [name] facility upon discharge.  No charge for no-show.

## 2020-12-09 NOTE — Discharge Summary (Deleted)
Physician Discharge Summary  [name] [name] [id number] DOB: 1965-11-09 DOA: 09/23/2020  PCP: [name], MD  Admit date: 09/23/2020 Discharge date: 12/09/2020  Admitted From: home  Disposition:  home   Recommendations for Outpatient Follow-up:  F/u on food wounds  Home Health:  ordered  Discharge Condition:  fair   CODE STATUS:  full code   Diet recommendation:  renal, diabetic, heart healthy Consultations: Nephrology Podiatry   Procedures/Studies:    Discharge Diagnoses:  Principal Problem:   Volume overload Active Problems:   Osteomyelitis of foot, left, acute ([name])   Osteomyelitis of foot, right, acute ([name])   Stage III pressure ulcer of sacral region ([place])   Acute systolic CHF (congestive heart failure) ([place])   Essential hypertension   ESRD on hemodialysis ([place])   HLD (hyperlipidemia)   Type II diabetes mellitus with renal manifestations ([place])   Secondary hyperparathyroidism ([place])   Pressure injury of skin   Acute on chronic systolic congestive heart failure ([place])     Brief Summary: [name] [name] a 55 y.o. male with medical history significant of upper extremity arterial thrombosis, GI bleed, CHF, ESRD on HD, depression, diabetes, osteomyelitis, diabetic foot ulcer, hypertension, hyperlipidemia who presents with ongoing shortness of breath and bilateral edema. He has been getting progressively short of breath with increasing lower extremity swelling for at least a week now  Hospital Course:  Principal Problem:   Acute exacerbation of systolic CHF (congestive heart failure)/fluid overload in a patient with end-stage renal disease - Accompanied with shortness of breath - Have consulted nephrology and he has received dialysis today -2D echo reveals an EF of 25 to 30%, global hypokinesis of the LV which is also moderately dilated-indeterminate diastolic filling.  Moderately elevated pulmonary artery systolic pressure -His EF has dropped significantly  since last year when it was 40 to 45% on 04/24/2019 - cont to control with dialysis-   - weight improved from 224 to 208 lb     Active Problems: Bilateral foot wounds with osteomyelitis of right foot - see pictures below- right wound has extensive bone exposure and foul smell- when I showed the pictures to the patient he was shocked and emotional about the severity - I have requested podiatry to evaluate these-wound care has also evaluated the patient states that he was following with outpatient wound care once a week and a nurse was coming to his house once a week to cleanse the wounds -Per wound care eval today recommendations are as follows> Cleanse heels with NS and pat dry. Apply Santyl to wound bed, cover with NS moist gauze and ABD/KErlix/tape.  Apply Xeroform gauze to any intact or ruptured blisters. Wrap bilateral lower legs with kerlix and ace bandage.  Change daily. - Dr Jacqualyn Posey recommends and MRI and a BKA of the right LE- plans for bone biopsy in OR- patient and mother insisting on going home- I have touched base with Dr Jacqualyn Posey who has seen the patient and spoken with him and his mother- ID has also spoken with them- they are declining definitive surgery   Sacral wound - Appreciate wound care recommendations>Apply aquacel Ag (LAWSON # [id number]) to skin breakdown apply Gerhardts butt paste to buttocks on maceration.  Cover open wounds with ABD/tape. Change daily. Will implement low air loss mattress -NO DISPOSABLE BRIEFS OR UNDERPADS UNDER PATIENT.  USE A DERMATHERAPY Underpad with low air loss mattress.   Pressure Injury 09/24/20 Heel Left Stage 4 - Full thickness tissue loss with exposed bone, tendon or  muscle. (Active)  09/24/20 1022  Location: Heel  Location Orientation: Left  Staging: Stage 4 - Full thickness tissue loss with exposed bone, tendon or muscle.  Wound Description (Comments):   Present on Admission: Yes     Pressure Injury 09/24/20 Heel Right Stage 4 - Full  thickness tissue loss with exposed bone, tendon or muscle. (Active)  09/24/20 1023  Location: Heel  Location Orientation: Right  Staging: Stage 4 - Full thickness tissue loss with exposed bone, tendon or muscle.  Wound Description (Comments):   Present on Admission: Yes     Pressure Injury 09/24/20 Vertebral column Lower;Medial Stage 3 -  Full thickness tissue loss. Subcutaneous fat may be visible but bone, tendon or muscle are NOT exposed. (Active)  09/24/20 1024  Location: Vertebral column  Location Orientation: Lower;Medial  Staging: Stage 3 -  Full thickness tissue loss. Subcutaneous fat may be visible but bone, tendon or muscle are NOT exposed.  Wound Description (Comments):   Present on Admission: Yes     Pressure Injury 09/24/20 Back Medial Stage 3 -  Full thickness tissue loss. Subcutaneous fat may be visible but bone, tendon or muscle are NOT exposed. (Active)  09/24/20 1026  Location: Back  Location Orientation: Medial  Staging: Stage 3 -  Full thickness tissue loss. Subcutaneous fat may be visible but bone, tendon or muscle are NOT exposed.  Wound Description (Comments):   Present on Admission: Yes         HLD (hyperlipidemia) -Continue statin     Type II diabetes mellitus with renal manifestations Hemoglobin A1C Labs (Brief)          Component Value Date/Time    HGBA1C 7.7 (H) 09/24/2020 [id number]    -  -Continue sliding scale and insulin pump   Hypotension Continue midodrine with dialysis     Discharge Exam: Vitals:   09/26/20 1545 09/26/20 1626  BP: (!) 154/81 (!) 149/85  Pulse: 96 97  Resp: (!) 21 17  Temp: 97.6 F (36.4 C) 98.3 F (36.8 C)  SpO2: 99% 100%   Vitals:   09/26/20 1530 09/26/20 1534 09/26/20 1545 09/26/20 1626  BP: (!) 143/83 (!) 163/79 (!) 154/81 (!) 149/85  Pulse: 96 98 96 97  Resp: 20 17 (!) 21 17  Temp:   97.6 F (36.4 C) 98.3 F (36.8 C)  TempSrc:   Oral Oral  SpO2:   99% 100%  Weight:      Height:        General: Pt is  alert, awake, not in acute distress Cardiovascular: RRR, S1/S2 +, no rubs, no gallops Respiratory: CTA bilaterally, no wheezing, no rhonchi Abdominal: Soft, NT, ND, bowel sounds + Extremities: no edema, no cyanosis   Discharge Instructions  Discharge Instructions     Diet general   Complete by: As directed    Renal carb modified diet.   No wound care   Complete by: As directed       Allergies as of 09/26/2020   No Known Allergies      Medication List     TAKE these medications    aspirin 325 MG tablet Take 325 mg by mouth daily.   atorvastatin 10 MG tablet Commonly known as: LIPITOR Take 10 mg by mouth every evening.   calcium acetate 667 MG capsule Commonly known as: PHOSLO Take 1,334 mg by mouth 3 (three) times daily with meals.   Cequa 0.09 % Soln Generic drug: cycloSPORINE (PF) Place 1 drop into both eyes daily.  cinacalcet 30 MG tablet Commonly known as: SENSIPAR Take 30 mg by mouth daily.   FreeStyle [employer] 14 Day Sensor Misc See admin instructions.   insulin lispro 100 UNIT/ML injection Commonly known as: HUMALOG Inject into the skin See admin instructions. PUMP   lidocaine 5 % ointment Commonly known as: XYLOCAINE Apply 1 application topically 2 (two) times daily as needed (finger pain).   multivitamin with minerals Tabs tablet Take 1 tablet by mouth daily in the afternoon.   mupirocin ointment 2 % Commonly known as: BACTROBAN Apply 1 application topically daily as needed (itching).   Omnipod DASH Pods (Gen 4) Misc Inject into the skin. Use with Humalog PUMP   ondansetron 4 MG tablet Commonly known as: ZOFRAN Take 4 mg by mouth 3 (three) times daily as needed for nausea or vomiting.   Systane 0.4-0.3 % Soln Generic drug: Polyethyl Glycol-Propyl Glycol Place 1 drop into both eyes daily as needed (dry eyes).   Velphoro 500 MG chewable tablet Generic drug: sucroferric oxyhydroxide Chew 500 mg by mouth daily.   vitamin B-12 500 MCG  tablet Commonly known as: CYANOCOBALAMIN Take 500 mcg by mouth daily.        Follow-up Information     [name], MD Follow up.   Specialty: Pulmonary Disease Why: CALL AND SCHEDULE APPT. Contact information: [place] [address] [phone]         [name], MD Follow up.   Specialty: Cardiology Why: GO: OCT. 14 AT 8:20AM Contact information: [address] [address] [phone]                No Known Allergies    No results found.   The results of significant diagnostics from this hospitalization (including imaging, microbiology, ancillary and laboratory) are listed below for reference.     Microbiology: No results found for this or any previous visit (from the past 240 hour(s)).   Labs: BNP (last 3 results) Recent Labs    07/21/20 1234 09/23/20 2015  BNP 4,425.6* [id number]*   Basic Metabolic Panel: No results for input(s): NA, K, CL, CO2, GLUCOSE, BUN, CREATININE, CALCIUM, MG, PHOS in the last 168 hours. Liver Function Tests: No results for input(s): AST, ALT, ALKPHOS, BILITOT, PROT, ALBUMIN in the last 168 hours. No results for input(s): LIPASE, AMYLASE in the last 168 hours. No results for input(s): AMMONIA in the last 168 hours. CBC: No results for input(s): WBC, NEUTROABS, HGB, HCT, MCV, PLT in the last 168 hours. Cardiac Enzymes: No results for input(s): CKTOTAL, CKMB, CKMBINDEX, TROPONINI in the last 168 hours. BNP: Invalid input(s): POCBNP CBG: No results for input(s): GLUCAP in the last 168 hours. D-Dimer No results for input(s): DDIMER in the last 72 hours. Hgb A1c No results for input(s): HGBA1C in the last 72 hours. Lipid Profile No results for input(s): CHOL, HDL, LDLCALC, TRIG, CHOLHDL, LDLDIRECT in the last 72 hours. Thyroid function studies No results for input(s): TSH, T4TOTAL, T3FREE, THYROIDAB in the last 72 hours.  Invalid input(s): FREET3 Anemia  work up No results for input(s): VITAMINB12, FOLATE, FERRITIN, TIBC, IRON, RETICCTPCT in the last 72 hours. Urinalysis No results found for: COLORURINE, APPEARANCEUR, LABSPEC, [place], GLUCOSEU, HGBUR, BILIRUBINUR, KETONESUR, PROTEINUR, UROBILINOGEN, NITRITE, LEUKOCYTESUR Sepsis Labs Invalid input(s): PROCALCITONIN,  WBC,  LACTICIDVEN Microbiology No results found for this or any previous visit (from the past 240 hour(s)).   Time coordinating discharge in minutes: 65  SIGNED:   Debbe Odea, MD  [facility] 12/09/2020, 2:23 PM

## 2020-12-18 NOTE — Progress Notes (Signed)
[name] [name], [name] [name] ([id number]) Visit Report for 08/20/2020 Arrival Information Details Patient Name: Date of Service: [place], [name] L. 08/20/2020 9:30 A M Medical Record Number: [id number] Patient Account Number: [id number] Date of Birth/Sex: Treating RN: March 13, 1966 (55 y.o. [name] [name] Primary Care Yoshie Kosel: [name] [name] Other Clinician: Referring Adriauna Campton: Treating Jaquilla Woodroof/Extender: [name] in Treatment: 7 Visit Information History Since Last Visit Added or deleted any medications: No Patient Arrived: Wheel Chair Any new allergies or adverse reactions: No Arrival Time: 09:41 Had a fall or experienced change in No Accompanied By: mother activities of daily living that may affect Transfer Assistance: None risk of falls: Patient Identification Verified: Yes Signs or symptoms of abuse/neglect since last visito No Secondary Verification Process Completed: Yes Hospitalized since last visit: No Patient Has Alerts: Yes Implantable device outside of the clinic excluding No Patient Alerts: R TBI: 0.56 cellular tissue based products placed in the center L TBI: 0.75 since last visit: Has Dressing in Place as Prescribed: Yes Pain Present Now: Yes Electronic Signature(s) Signed: 12/18/2020 4:51:55 PM By: Levan Hurst RN, BSN Entered By: Levan Hurst on 08/20/2020 09:42:12 -------------------------------------------------------------------------------- Encounter Discharge Information Details Patient Name: Date of Service: [name] [name], [place] L. 08/20/2020 9:30 A M Medical Record Number: [id number] Patient Account Number: [id number] Date of Birth/Sex: Treating RN: [birth year] (55 y.o. [name] [name], [name] [name] Primary Care Carrine Kroboth: [name] [name] Other Clinician: Referring Malcome Ambrocio: Treating Lyzbeth Genrich/Extender: [name] in Treatment: 7 Encounter Discharge Information Items Post Procedure Vitals Discharge  Condition: Stable Temperature (F): 98.7 Ambulatory Status: Wheelchair Pulse (bpm): 74 Discharge Destination: Home Respiratory Rate (breaths/min): 17 Transportation: Private Auto Blood Pressure (mmHg): 130/74 Accompanied By: Delton [place] Schedule Follow-up Appointment: Yes Clinical Summary of Care: Patient Declined Electronic Signature(s) Signed: 08/21/2020 12:00:56 PM By: Rhae Hammock RN Entered By: Rhae Hammock on 08/20/2020 11:09:58 -------------------------------------------------------------------------------- Lower Extremity Assessment Details Patient Name: Date of Service: [name] [name], [name] [place] L. 08/20/2020 9:30 A M Medical Record Number: [id number] Patient Account Number: [id number] Date of Birth/Sex: Treating RN: 1965/09/18 (55 y.o. [name] [name] Primary Care Jaz Mallick: [name] [name] Other Clinician: Referring Laurena Valko: Treating Dorien Mayotte/Extender: [name] in Treatment: 7 Edema Assessment Assessed: Shirlyn Goltz: No] Patrice Paradise: No] Edema: [Left: Yes] [Right: Yes] Calf Left: Right: Point of Measurement: 34 cm From Medial Instep 51 cm 48 cm Ankle Left: Right: Point of Measurement: 12 cm From Medial Instep 25 cm 25.5 cm Vascular Assessment Pulses: Dorsalis Pedis Palpable: [Left:Yes] [Right:Yes] Electronic Signature(s) Signed: 12/18/2020 4:51:55 PM By: Levan Hurst RN, BSN Entered By: Levan Hurst on 08/20/2020 10:03:04 -------------------------------------------------------------------------------- Multi Wound Chart Details Patient Name: Date of Service: [name] [name], [place] [name] L. 08/20/2020 9:30 A M Medical Record Number: [id number] Patient Account Number: [id number] Date of Birth/Sex: Treating RN: 06/09/1965 (55 y.o. [name] Primary Care Kobie Matkins: [name] [name] Other Clinician: Referring Rudy Domek: Treating Marlaine Arey/Extender: [name] in Treatment: 7 Vital Signs Height(in): 65 Capillary  Blood Glucose(mg/dl): 138 Weight(lbs): 240 Pulse(bpm): 108 Body Mass Index(BMI): 40 Blood Pressure(mmHg): 117/70 Temperature(F): 98.7 Respiratory Rate(breaths/min): 18 Photos: [2:No Photos Right Calcaneus] [3:No Photos Left Calcaneus] [4:No Photos Right Ear] Wound Location: [2:Pressure Injury] [3:Pressure Injury] [4:Pressure Injury] Wounding Event: [2:Diabetic Wound/Ulcer of the Lower] [3:Diabetic Wound/Ulcer of the Lower] [4:Pressure Ulcer] Primary Etiology: [2:Extremity Anemia, Congestive Heart Failure,] [3:Extremity Anemia, Congestive Heart Failure,] [4:Anemia, Congestive Heart Failure,] Comorbid History: [2:Hypertension, Type II Diabetes, End Stage Renal Disease, Raynauds, Neuropathy 04/22/2020] [3:Hypertension, Type II Diabetes, End Stage Renal Disease, Raynauds, Neuropathy 04/22/2020] [4:Hypertension,  Type II Diabetes, End Stage Renal  Disease, Raynauds, Neuropathy 08/07/2020] Date Acquired: [2:7] [3:7] [4:1] Weeks of Treatment: [2:Open] [3:Open] [4:Healed - Epithelialized] Wound Status: [2:5.8x6.8x1] [3:5.2x6.5x0.2] [4:0x0x0] Measurements L x W x D (cm) [2:30.976] [3:26.546] [4:0] A (cm) : rea [2:30.976] [3:5.309] [4:0] Volume (cm) : [2:-192.10%] [3:-39.70%] [4:100.00%] % Reduction in A rea: [2:-1360.40%] [3:69.00%] [4:100.00%] % Reduction in Volume: [2:Grade 2] [3:Grade 2] [4:Category/Stage II] Classification: [2:Large] [3:Large] [4:None Present] Exudate A mount: [2:Purulent] [3:Serosanguineous] [4:N/A] Exudate Type: [2:yellow, brown, green] [3:red, brown] [4:N/A] Exudate Color: [2:Well defined, not attached] [3:Well defined, not attached] [4:Distinct, outline attached] Wound Margin: [2:Small (1-33%)] [3:Medium (34-66%)] [4:None Present (0%)] Granulation A mount: [2:Pink] [3:Pink] [4:N/A] Granulation Quality: [2:Large (67-100%)] [3:Medium (34-66%)] [4:None Present (0%)] Necrotic A mount: [2:Eschar, Adherent Slough] [3:Eschar, Adherent Slough] [4:N/A] Necrotic Tissue: [2:Fat  Layer (Subcutaneous Tissue): Yes Fat Layer (Subcutaneous Tissue): Yes Fascia: No] Exposed Structures: [2:Fascia: No Tendon: No Muscle: No Joint: No Bone: No Small (1-33%)] [3:Fascia: No Tendon: No Muscle: No Joint: No Bone: No Small (1-33%)] [4:Fat Layer (Subcutaneous Tissue): No Tendon: No Muscle: No Joint: No Bone: No Large (67-100%)] Epithelialization: [2:Debridement - Excisional] [3:Debridement - Excisional] [4:N/A] Debridement: Pre-procedure Verification/Time Out 10:15 [3:10:15] [4:N/A] Taken: [2:Other] [3:Other] [4:N/A] Pain Control: [2:Subcutaneous, Slough] [3:Necrotic/Eschar, Subcutaneous,] [4:N/A] Tissue Debrided: [2:Skin/Subcutaneous Tissue] [3:Slough Skin/Subcutaneous Tissue] [4:N/A] Level: [2:6] [3:6] [4:N/A] Debridement A (sq cm): [2:rea Forceps, Scissors] [3:Blade, Forceps] [4:N/A] Instrument: [2:Minimum] [3:Minimum] [4:N/A] Bleeding: [2:Pressure] [3:Pressure] [4:N/A] Hemostasis Achieved: [2:3] [3:3] [4:N/A] Procedural Pain: [2:2] [3:2] [4:N/A] Post Procedural Pain: [2:Procedure was tolerated well] [3:Procedure was tolerated well] [4:N/A] Debridement Treatment Response: [2:5.8x6.8x1] [3:5.2x6.5x0.2] [4:N/A] Post Debridement Measurements L x W x D (cm) [2:30.976] [3:5.309] [4:N/A] Post Debridement Volume: (cm) [2:Debridement] [3:Debridement] [4:N/A] Treatment Notes Electronic Signature(s) Signed: 08/20/2020 10:49:52 AM By: Kalman Shan DO Signed: 08/20/2020 6:06:42 PM By: Baruch Gouty RN, BSN Entered By: Kalman Shan on 08/20/2020 10:31:51 -------------------------------------------------------------------------------- Multi-Disciplinary Care Plan Details Patient Name: Date of Service: [name] Mocha, MA [name] 08/20/2020 9:30 A M Medical Record Number: [id number] Patient Account Number: [id number] Date of Birth/Sex: Treating RN: April 16, 1965 (55 y.o. [name] Primary Care Leston Schueller: [name] [name] Other Clinician: Referring Deryn Massengale: Treating  Angala Hilgers/Extender: [name] in Treatment: 7 Multidisciplinary Care Plan reviewed with physician Active Inactive Abuse / Safety / Falls / Self Care Management Nursing Diagnoses: Potential for falls Potential for injury related to falls Goals: Patient will not experience any injury related to falls Date Initiated: 07/01/2020 Target Resolution Date: 08/29/2020 Goal Status: Active Patient/caregiver will verbalize/demonstrate measures taken to prevent injury and/or falls Date Initiated: 07/01/2020 Date Inactivated: 08/11/2020 Target Resolution Date: 08/08/2020 Goal Status: Met Interventions: Assess Activities of Daily Living upon admission and as needed Assess fall risk on admission and as needed Assess: immobility, friction, shearing, incontinence upon admission and as needed Assess impairment of mobility on admission and as needed per policy Assess personal safety and home safety (as indicated) on admission and as needed Provide education on fall prevention Provide education on personal and home safety Notes: Nutrition Nursing Diagnoses: Impaired glucose control: actual or potential Potential for alteratiion in Nutrition/Potential for imbalanced nutrition Goals: Patient/caregiver agrees to and verbalizes understanding of need to use nutritional supplements and/or vitamins as prescribed Date Initiated: 07/01/2020 Date Inactivated: 08/11/2020 Target Resolution Date: 08/08/2020 Goal Status: Met Patient/caregiver will maintain therapeutic glucose control Date Initiated: 07/01/2020 Target Resolution Date: 08/29/2020 Goal Status: Active Interventions: Assess HgA1c results as ordered upon admission and as needed Assess patient nutrition upon admission and as needed  per policy Provide education on elevated blood sugars and impact on wound healing Provide education on nutrition Treatment Activities: Education provided on Nutrition :  07/01/2020 Notes: Wound/Skin Impairment Nursing Diagnoses: Impaired tissue integrity Knowledge deficit related to ulceration/compromised skin integrity Goals: Patient/caregiver will verbalize understanding of skin care regimen Date Initiated: 07/01/2020 Target Resolution Date: 08/29/2020 Goal Status: Active Ulcer/skin breakdown will have a volume reduction of 30% by week 4 Date Initiated: 07/01/2020 Date Inactivated: 08/11/2020 Target Resolution Date: 08/08/2020 Unmet Reason: eschar requiring Goal Status: Unmet multiple debridements Ulcer/skin breakdown will have a volume reduction of 50% by week 8 Date Initiated: 08/11/2020 Target Resolution Date: 08/29/2020 Goal Status: Active Interventions: Assess patient/caregiver ability to obtain necessary supplies Assess patient/caregiver ability to perform ulcer/skin care regimen upon admission and as needed Assess ulceration(s) every visit Provide education on ulcer and skin care Notes: Electronic Signature(s) Signed: 08/20/2020 6:06:42 PM By: Baruch Gouty RN, BSN Entered By: Baruch Gouty on 08/20/2020 10:14:21 -------------------------------------------------------------------------------- Pain Assessment Details Patient Name: Date of Service: [name] [name], [place] L. 08/20/2020 9:30 A M Medical Record Number: [id number] Patient Account Number: [id number] Date of Birth/Sex: Treating RN: 02/09/1966 (55 y.o. [name] [name] Primary Care Masen Salvas: [name] [name] Other Clinician: Referring Nami Strawder: Treating Dusty Wagoner/Extender: [name] in Treatment: 7 Active Problems Location of Pain Severity and Description of Pain Patient Has Paino Yes Site Locations Pain Location: Pain in Ulcers With Dressing Change: Yes Rate the pain. Current Pain Level: 6 Character of Pain Describe the Pain: Throbbing Pain Management and Medication Current Pain Management: Medication: Yes Cold Application: No Rest:  No Massage: No Activity: No T.E.N.S.: No Heat Application: No Leg drop or elevation: No Is the Current Pain Management Adequate: Adequate How does your wound impact your activities of daily livingo Sleep: No Bathing: No Appetite: No Relationship With Others: No Bladder Continence: No Emotions: No Bowel Continence: No Work: No Toileting: No Drive: No Dressing: No Hobbies: No Electronic Signature(s) Signed: 12/18/2020 4:51:55 PM By: Levan Hurst RN, BSN Entered By: Levan Hurst on 08/20/2020 09:43:13 -------------------------------------------------------------------------------- Patient/Caregiver Education Details Patient Name: Date of Service: [name] [name], Jacklynn Bue 6/1/2022andnbsp9:30 [place] Record Number: [id number] Patient Account Number: [id number] Date of Birth/Gender: Treating RN: 24-Nov-1965 (55 y.o. [name] Primary Care Physician: [name] [name] Other Clinician: Referring Physician: Treating Physician/Extender: [name] in Treatment: 7 Education Assessment Education Provided To: Patient Education Topics Provided Pressure: Methods: Explain/Verbal Responses: Reinforcements needed, State content correctly Wound/Skin Impairment: Methods: Explain/Verbal Responses: Reinforcements needed, State content correctly Electronic Signature(s) Signed: 08/20/2020 6:06:42 PM By: Baruch Gouty RN, BSN Entered By: Baruch Gouty on 08/20/2020 10:14:46 -------------------------------------------------------------------------------- Wound Assessment Details Patient Name: Date of Service: [name] [name], [place] L. 08/20/2020 9:30 A M Medical Record Number: [id number] Patient Account Number: [id number] Date of Birth/Sex: Treating RN: 1965-05-07 (55 y.o. [name] [name] Primary Care Sanna Porcaro: [name] [name] Other Clinician: Referring Larhonda Dettloff: Treating Jakyron Fabro/Extender: [name] in  Treatment: 7 Wound Status Wound Number: 2 Primary Diabetic Wound/Ulcer of the Lower Extremity Etiology: Wound Location: Right Calcaneus Wound Open Wounding Event: Pressure Injury Status: Date Acquired: 04/22/2020 Comorbid Anemia, Congestive Heart Failure, Hypertension, Type II Diabetes, Weeks Of Treatment: 7 History: End Stage Renal Disease, Raynauds, Neuropathy Clustered Wound: No Photos Wound Measurements Length: (cm) 5.8 Width: (cm) 6.8 Depth: (cm) 1 Area: (cm) 30.976 Volume: (cm) 30.976 % Reduction in Area: -192.1% % Reduction in Volume: -1360.4% Epithelialization: Small (1-33%) Tunneling: No Undermining: No Wound Description Classification: Grade 2 Wound  Margin: Well defined, not attached Exudate Amount: Large Exudate Type: Purulent Exudate Color: yellow, brown, green Foul Odor After Cleansing: No Slough/Fibrino Yes Wound Bed Granulation Amount: Small (1-33%) Exposed Structure Granulation Quality: Pink Fascia Exposed: No Necrotic Amount: Large (67-100%) Fat Layer (Subcutaneous Tissue) Exposed: Yes Necrotic Quality: Eschar, Adherent Slough Tendon Exposed: No Muscle Exposed: No Joint Exposed: No Bone Exposed: No Electronic Signature(s) Signed: 08/21/2020 1:41:47 PM By: Sandre Kitty Signed: 12/18/2020 4:51:55 PM By: Levan Hurst RN, BSN Entered By: Sandre Kitty on 08/20/2020 17:06:05 -------------------------------------------------------------------------------- Wound Assessment Details Patient Name: Date of Service: [name] [name], [place] [name] 08/20/2020 9:30 A M Medical Record Number: [id number] Patient Account Number: [id number] Date of Birth/Sex: Treating RN: 15-Jan-1966 (55 y.o. [name] [name] Primary Care Daniah Zaldivar: [name] [name] Other Clinician: Referring Bobbi Kozakiewicz: Treating Yahmir Sokolov/Extender: [name] in Treatment: 7 Wound Status Wound Number: 3 Primary Diabetic Wound/Ulcer of the Lower  Extremity Etiology: Wound Location: Left Calcaneus Wound Open Wounding Event: Pressure Injury Status: Date Acquired: 04/22/2020 Comorbid Anemia, Congestive Heart Failure, Hypertension, Type II Diabetes, Weeks Of Treatment: 7 History: End Stage Renal Disease, Raynauds, Neuropathy Clustered Wound: No Photos Wound Measurements Length: (cm) 5.2 Width: (cm) 6.5 Depth: (cm) 0.2 Area: (cm) 26.546 Volume: (cm) 5.309 % Reduction in Area: -39.7% % Reduction in Volume: 69% Epithelialization: Small (1-33%) Tunneling: No Undermining: No Wound Description Classification: Grade 2 Wound Margin: Well defined, not attached Exudate Amount: Large Exudate Type: Serosanguineous Exudate Color: red, brown Foul Odor After Cleansing: No Slough/Fibrino Yes Wound Bed Granulation Amount: Medium (34-66%) Exposed Structure Granulation Quality: Pink Fascia Exposed: No Necrotic Amount: Medium (34-66%) Fat Layer (Subcutaneous Tissue) Exposed: Yes Necrotic Quality: Eschar, Adherent Slough Tendon Exposed: No Muscle Exposed: No Joint Exposed: No Bone Exposed: No Electronic Signature(s) Signed: 08/21/2020 1:41:47 PM By: Sandre Kitty Signed: 12/18/2020 4:51:55 PM By: Levan Hurst RN, BSN Entered By: Sandre Kitty on 08/20/2020 17:06:25 -------------------------------------------------------------------------------- Wound Assessment Details Patient Name: Date of Service: [name] [name], [place] [name] 08/20/2020 9:30 A M Medical Record Number: [id number] Patient Account Number: [id number] Date of Birth/Sex: Treating RN: [birth year] (55 y.o. [name] [name] Primary Care Vivien Barretto: [name] [name] Other Clinician: Referring Trygg Mantz: Treating Axavier Pressley/Extender: [name] in Treatment: 7 Wound Status Wound Number: 4 Primary Pressure Ulcer Etiology: Wound Location: Right Ear Wound Healed - Epithelialized Wounding Event: Pressure Injury Status: Date Acquired:  08/07/2020 Comorbid Anemia, Congestive Heart Failure, Hypertension, Type II Diabetes, Weeks Of Treatment: 1 History: End Stage Renal Disease, Raynauds, Neuropathy Clustered Wound: No Wound Measurements Length: (cm) Width: (cm) Depth: (cm) Area: (cm) Volume: (cm) 0 % Reduction in Area: 100% 0 % Reduction in Volume: 100% 0 Epithelialization: Large (67-100%) 0 Tunneling: No 0 Undermining: No Wound Description Classification: Category/Stage II Wound Margin: Distinct, outline attached Exudate Amount: None Present Foul Odor After Cleansing: No Slough/Fibrino No Wound Bed Granulation Amount: None Present (0%) Exposed Structure Necrotic Amount: None Present (0%) Fascia Exposed: No Fat Layer (Subcutaneous Tissue) Exposed: No Tendon Exposed: No Muscle Exposed: No Joint Exposed: No Bone Exposed: No Electronic Signature(s) Signed: 12/18/2020 4:51:55 PM By: Levan Hurst RN, BSN Entered By: Levan Hurst on 08/20/2020 10:05:33 -------------------------------------------------------------------------------- [place] Details Patient Name: Date of Service: [name] [name], [place] [name] 08/20/2020 9:30 A M Medical Record Number: [id number] Patient Account Number: [id number] Date of Birth/Sex: Treating RN: 1965-09-03 (55 y.o. [name] [name] Primary Care Chandler Stofer: [name] [name] Other Clinician: Referring Kaston Faughn: Treating Meldon Hanzlik/Extender: [name] in Treatment: 7 Vital Signs Time Taken: 09:42 Temperature (F):  98.7 Height (in): 65 Pulse (bpm): 108 Weight (lbs): 240 Respiratory Rate (breaths/min): 18 Body Mass Index (BMI): 39.9 Blood Pressure (mmHg): 117/70 Capillary Blood Glucose (mg/dl): 138 Reference Range: 80 - 120 mg / dl Notes glucose per pt report Electronic Signature(s) Signed: 12/18/2020 4:51:55 PM By: Levan Hurst RN, BSN Entered By: Levan Hurst on 08/20/2020 09:42:55

## 2021-01-02 ENCOUNTER — Ambulatory Visit: Payer: Medicare Other | Admitting: Interventional Cardiology

## 2021-01-02 ENCOUNTER — Ambulatory Visit: Payer: PRIVATE HEALTH INSURANCE | Admitting: Interventional Cardiology

## 2022-10-09 IMAGING — MR MR [PERSON_NAME] LOW WO/W CM*L*
4 of 7 series · 12 of 40 positions shown · IV contrast (gadavist)
Comparison: Radiographs 04/24/2018. No recent radiographs available

CLINICAL DATA: Penetrating foot trauma. Foreign body suspected.
Diabetic heel ulcer with pain and swelling for 1 month

EXAM:
MRI OF LOWER LEFT EXTREMITY WITHOUT AND WITH CONTRAST
TECHNIQUE: Multiplanar, multisequence MR imaging of the left hindfoot was
performed both before and after administration of intravenous
contrast.
CONTRAST:  10mL GADAVIST GADOBUTROL 1 MMOL/ML IV SOLN

[Series 3: PD fat-sat · axial · left · 3.0mm · 0.25mm/px · z∈[-78,+17]mm · 3 of 30 slices shown]
[im 6/30]
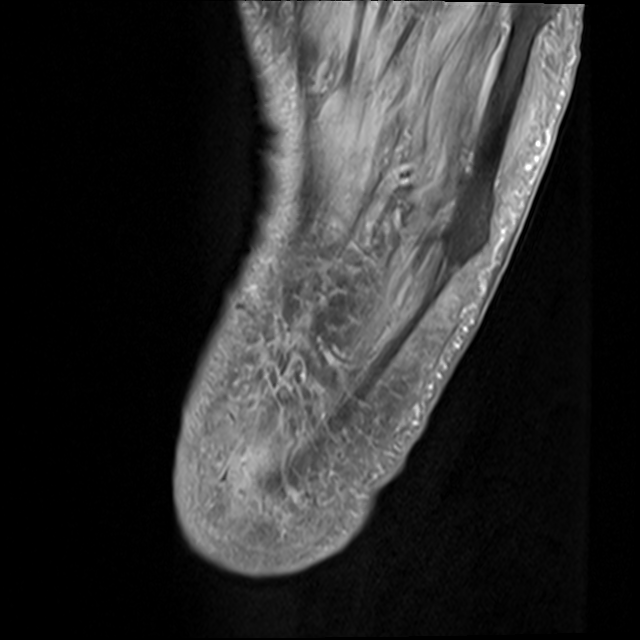
[im 18/30]
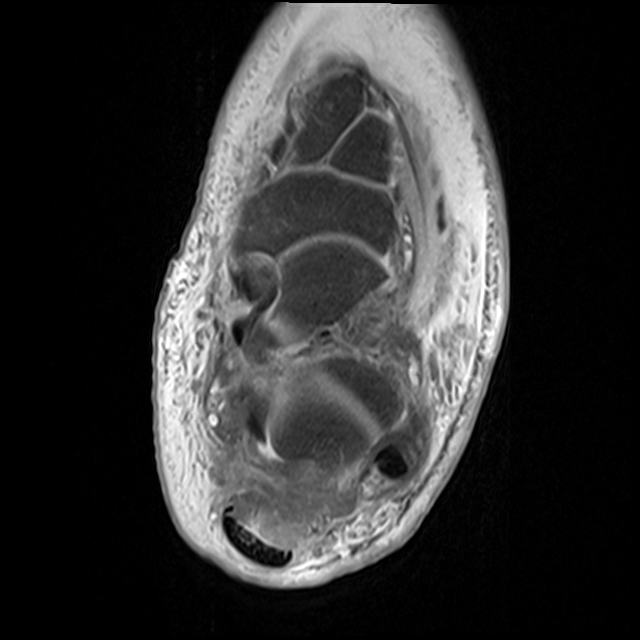
[im 30/30]
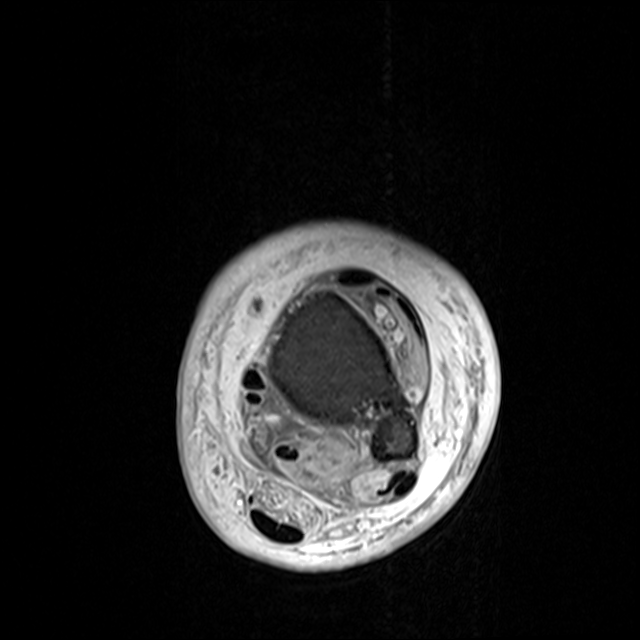

[Series 4: T2 fat-sat · axial · left · 3.0mm · 0.25mm/px · z∈[-78,+17]mm · 3 of 30 slices shown (1 of 2)]
[im 6/30]
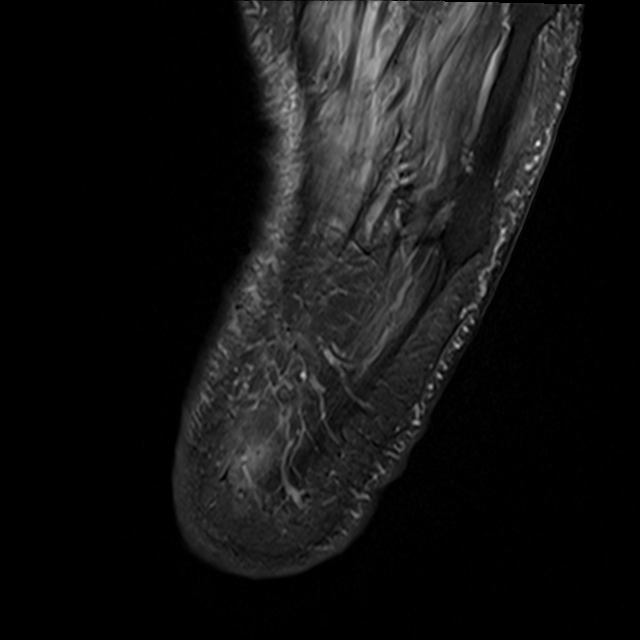
[im 18/30]
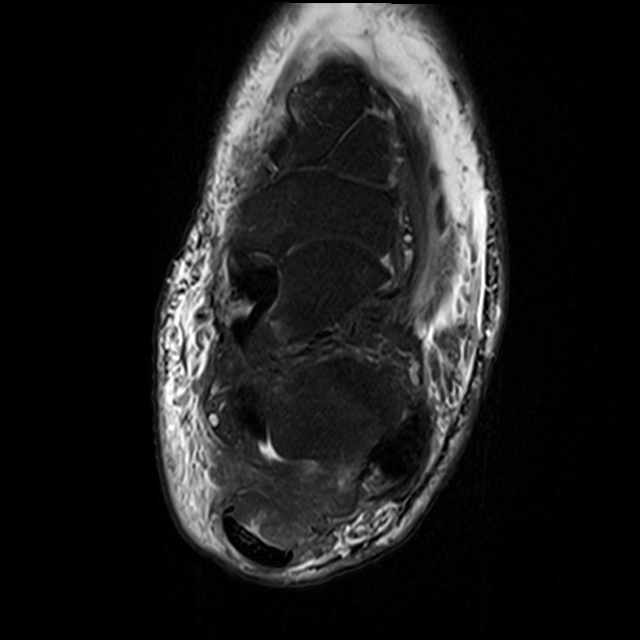
[im 30/30]
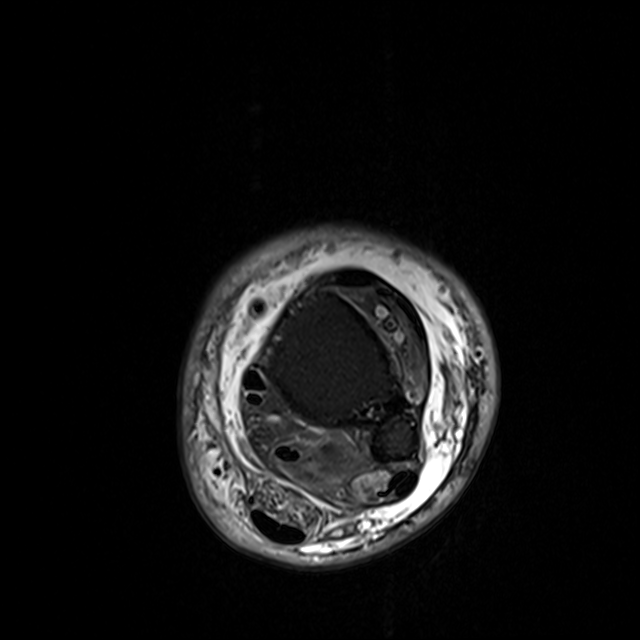

[Series 5: T1 · sagittal · left · 4.0mm · 0.27mm/px · 3 of 24 slices shown]
[im 1/24]
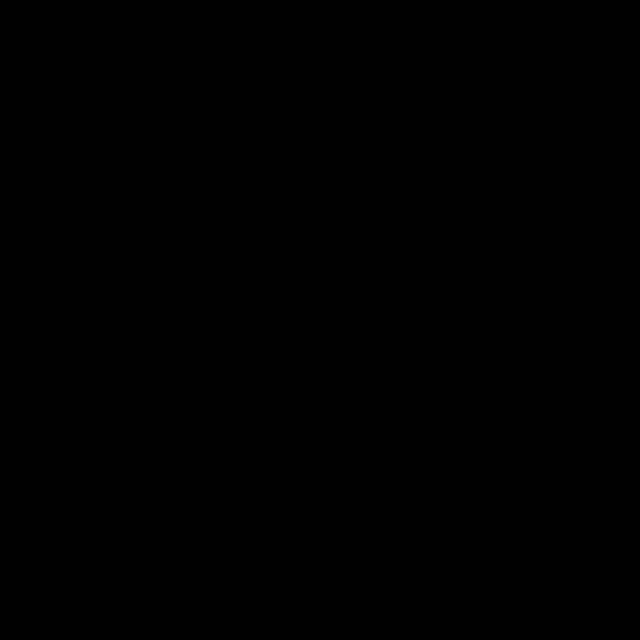
[im 12/24]
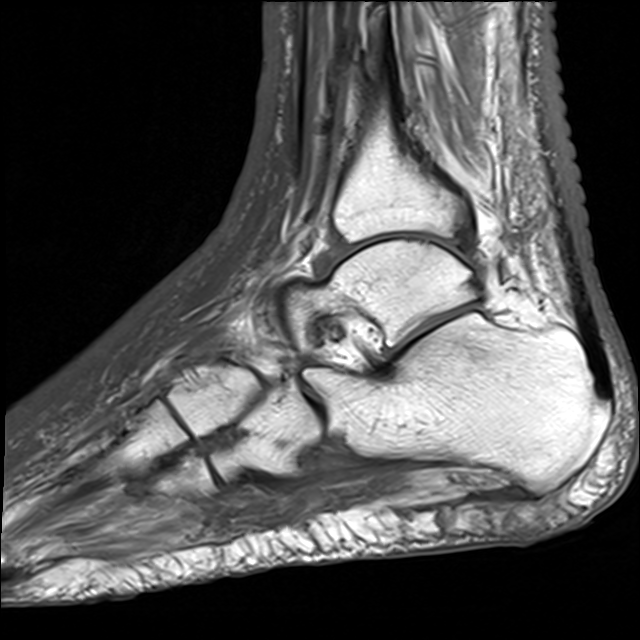
[im 24/24]
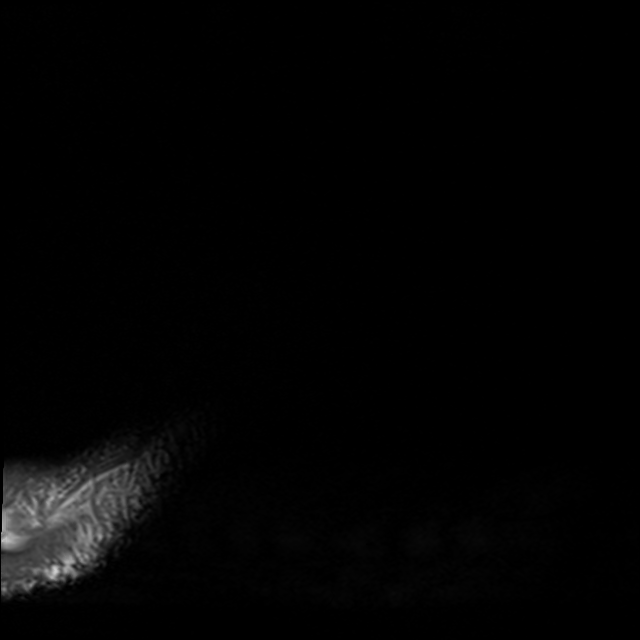

[Series 7: T2 fat-sat · coronal · left · 3.0mm · 0.25mm/px · 3 of 38 slices shown (2 of 2)]
[im 7/38]
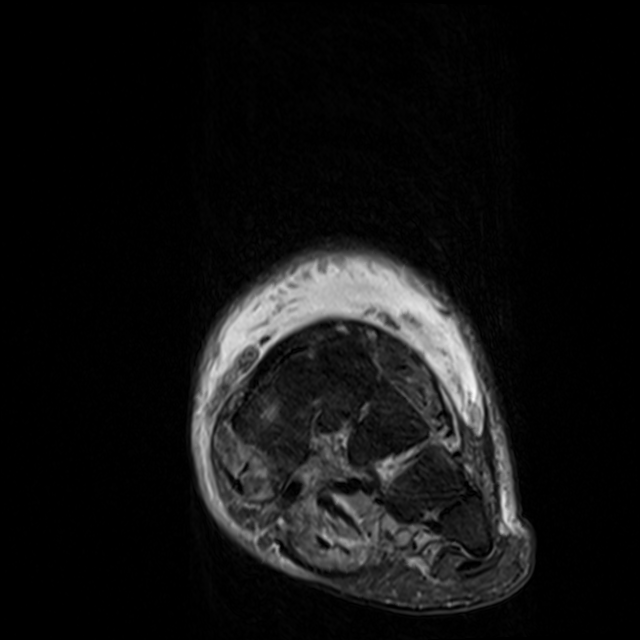
[im 19/38]
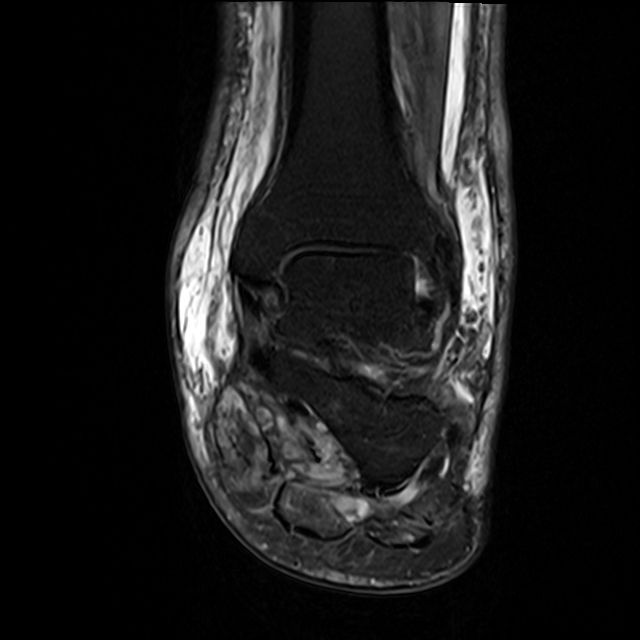
[im 31/38]
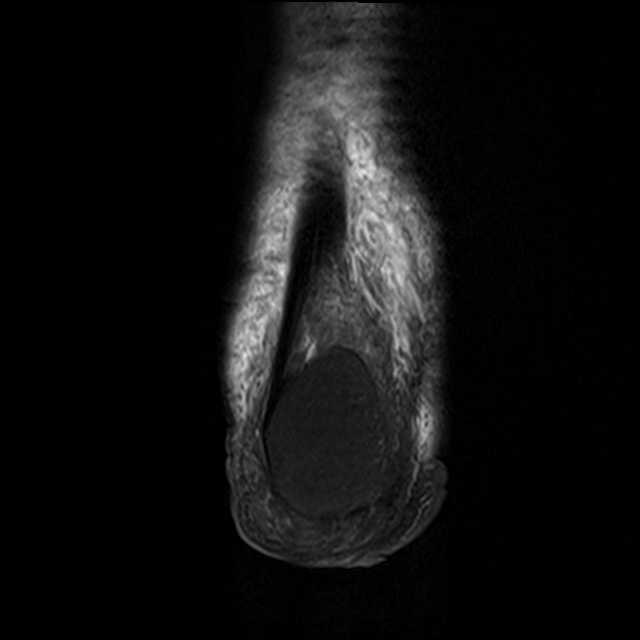

[12 of 40 positions shown; findings below may reference images not displayed]

FINDINGS: TENDONS

Peroneal: Intact and normally positioned.

Posteromedial: Intact and normally positioned.

Anterior: Intact and normally positioned.

Achilles: Intact.

Plantar Fascia: There is tendinosis and possible partial tearing at
the calcaneal attachment the medial cord.

LIGAMENTS

Lateral: The anterior and posterior talofibular and calcaneofibular
ligaments are intact.

Medial: The deltoid and visualized portions of the spring ligament
appear intact.

CARTILAGE AND BONES

Ankle Joint: The talar dome and tibial plafond are intact. No
significant ankle joint effusion or suspicious synovial enhancement.

Subtalar Joints/Sinus Tarsi: Unremarkable.

Bones: There is low-level marrow T2 hyperintensity and enhancement
within the plantar aspect of the calcaneal tuberosity near the
attachment of the medial cord of the plantar fascia. No cortical
destruction or other significant osseous findings are seen.

Other: Skin irregularity along the plantar aspect of the hindfoot,
superficial to the insertion of the medial cord of the plantar
fascia, presumed to reflect the patient's wound. Within the
underlying subcutaneous fat, there is T2 hyperintensity and
enhancement consistent with inflammation. There is no focal fluid
collection. There is a tiny linear focus of low signal on all pulse
sequences (best seen on the axial images) in this area which could
reflect gas or a small foreign body. No drainable fluid collection.
IMPRESSION: 1. Presumed wound along the plantar aspect of the hindfoot,
superficial to the insertion of the medial cord of the plantar
fascia. Inflammatory changes in the underlying subcutaneous fat with
possible tiny foreign body or soft tissue emphysema. No drainable
fluid collection.
2. Mild marrow T2 hyperintensity and enhancement within the plantar
aspect of the calcaneal tuberosity near the attachment of the medial
cord of the plantar fascia, likely reactive. Given proximity to the
presumed wound, early osteomyelitis difficult to exclude, although
no cortical destruction identified.
3. Tendinosis and possible partial tearing of the medial cord of the
plantar fascia.
4. No other significant findings.
